# Patient Record
Sex: Female | Born: 1935 | Race: White | Hispanic: No | Smoking: Former smoker
Health system: Southern US, Community
[De-identification: ages and names within clinical notes are randomized; demographics above are authoritative.]

## PROBLEM LIST (undated history)

## (undated) DIAGNOSIS — Z923 Personal history of irradiation: Secondary | ICD-10-CM

## (undated) DIAGNOSIS — C159 Malignant neoplasm of esophagus, unspecified: Secondary | ICD-10-CM

## (undated) DIAGNOSIS — E876 Hypokalemia: Secondary | ICD-10-CM

## (undated) DIAGNOSIS — I10 Essential (primary) hypertension: Secondary | ICD-10-CM

## (undated) DIAGNOSIS — M1990 Unspecified osteoarthritis, unspecified site: Secondary | ICD-10-CM

## (undated) DIAGNOSIS — Z9989 Dependence on other enabling machines and devices: Secondary | ICD-10-CM

## (undated) DIAGNOSIS — Z9221 Personal history of antineoplastic chemotherapy: Secondary | ICD-10-CM

## (undated) DIAGNOSIS — R0600 Dyspnea, unspecified: Secondary | ICD-10-CM

## (undated) DIAGNOSIS — C439 Malignant melanoma of skin, unspecified: Secondary | ICD-10-CM

## (undated) DIAGNOSIS — G4730 Sleep apnea, unspecified: Secondary | ICD-10-CM

## (undated) DIAGNOSIS — J449 Chronic obstructive pulmonary disease, unspecified: Secondary | ICD-10-CM

## (undated) DIAGNOSIS — R202 Paresthesia of skin: Secondary | ICD-10-CM

## (undated) DIAGNOSIS — H5702 Anisocoria: Secondary | ICD-10-CM

## (undated) DIAGNOSIS — C4490 Unspecified malignant neoplasm of skin, unspecified: Secondary | ICD-10-CM

## (undated) DIAGNOSIS — G4733 Obstructive sleep apnea (adult) (pediatric): Secondary | ICD-10-CM

## (undated) DIAGNOSIS — K219 Gastro-esophageal reflux disease without esophagitis: Secondary | ICD-10-CM

## (undated) DIAGNOSIS — Z87442 Personal history of urinary calculi: Secondary | ICD-10-CM

## (undated) DIAGNOSIS — G061 Intraspinal abscess and granuloma: Secondary | ICD-10-CM

## (undated) DIAGNOSIS — N3281 Overactive bladder: Secondary | ICD-10-CM

## (undated) DIAGNOSIS — R011 Cardiac murmur, unspecified: Secondary | ICD-10-CM

## (undated) DIAGNOSIS — R490 Dysphonia: Secondary | ICD-10-CM

## (undated) HISTORY — PX: GASTRIC BYPASS: SHX52

## (undated) HISTORY — PX: EYE SURGERY: SHX253

## (undated) HISTORY — PX: CHOLECYSTECTOMY: SHX55

## (undated) HISTORY — DX: Dependence on other enabling machines and devices: Z99.89

## (undated) HISTORY — PX: HIATAL HERNIA REPAIR: SHX195

## (undated) HISTORY — DX: Anisocoria: H57.02

## (undated) HISTORY — PX: HERNIA REPAIR: SHX51

## (undated) HISTORY — DX: Essential (primary) hypertension: I10

## (undated) HISTORY — DX: Dysphonia: R49.0

## (undated) HISTORY — PX: TONSILLECTOMY: SUR1361

## (undated) HISTORY — PX: TOTAL KNEE ARTHROPLASTY: SHX125

## (undated) HISTORY — PX: APPENDECTOMY: SHX54

## (undated) HISTORY — PX: OTHER SURGICAL HISTORY: SHX169

## (undated) HISTORY — DX: Malignant melanoma of skin, unspecified: C43.9

## (undated) HISTORY — DX: Paresthesia of skin: R20.2

## (undated) HISTORY — PX: CATARACT EXTRACTION, BILATERAL: SHX1313

## (undated) HISTORY — DX: Unspecified osteoarthritis, unspecified site: M19.90

## (undated) HISTORY — PX: MELANOMA EXCISION: SHX5266

## (undated) HISTORY — DX: Unspecified malignant neoplasm of skin, unspecified: C44.90

## (undated) HISTORY — DX: Overactive bladder: N32.81

## (undated) HISTORY — DX: Intraspinal abscess and granuloma: G06.1

## (undated) HISTORY — DX: Obstructive sleep apnea (adult) (pediatric): G47.33

## (undated) HISTORY — DX: Hypokalemia: E87.6

## (undated) HISTORY — DX: Sleep apnea, unspecified: G47.30

## (undated) HISTORY — DX: Malignant neoplasm of esophagus, unspecified: C15.9

## (undated) HISTORY — PX: ABDOMINAL HYSTERECTOMY: SHX81

## (undated) HISTORY — PX: CATARACT EXTRACTION: SUR2

## (undated) HISTORY — PX: JOINT REPLACEMENT: SHX530

---

## 2003-02-03 ENCOUNTER — Encounter: Admission: RE | Admit: 2003-02-03 | Discharge: 2003-05-04 | Payer: Self-pay | Admitting: Surgery

## 2003-02-17 ENCOUNTER — Encounter (INDEPENDENT_AMBULATORY_CARE_PROVIDER_SITE_OTHER): Payer: Self-pay | Admitting: Cardiology

## 2003-02-17 ENCOUNTER — Ambulatory Visit (HOSPITAL_COMMUNITY): Admission: RE | Admit: 2003-02-17 | Discharge: 2003-02-17 | Payer: Self-pay | Admitting: Internal Medicine

## 2003-03-15 ENCOUNTER — Encounter: Admission: RE | Admit: 2003-03-15 | Discharge: 2003-03-15 | Payer: Self-pay | Admitting: Surgery

## 2003-04-26 ENCOUNTER — Inpatient Hospital Stay (HOSPITAL_COMMUNITY): Admission: RE | Admit: 2003-04-26 | Discharge: 2003-04-29 | Payer: Self-pay | Admitting: Surgery

## 2003-05-10 ENCOUNTER — Encounter: Admission: RE | Admit: 2003-05-10 | Discharge: 2003-08-08 | Payer: Self-pay | Admitting: Surgery

## 2004-03-20 ENCOUNTER — Ambulatory Visit: Payer: Self-pay | Admitting: Internal Medicine

## 2004-09-04 ENCOUNTER — Ambulatory Visit: Payer: Self-pay | Admitting: Gastroenterology

## 2004-11-21 ENCOUNTER — Encounter: Admission: RE | Admit: 2004-11-21 | Discharge: 2005-02-19 | Payer: Self-pay | Admitting: Surgery

## 2005-03-05 ENCOUNTER — Encounter: Admission: RE | Admit: 2005-03-05 | Discharge: 2005-03-05 | Payer: Self-pay | Admitting: Surgery

## 2005-07-10 ENCOUNTER — Ambulatory Visit: Payer: Self-pay | Admitting: Internal Medicine

## 2005-07-23 ENCOUNTER — Ambulatory Visit: Payer: Self-pay | Admitting: Internal Medicine

## 2006-07-16 ENCOUNTER — Ambulatory Visit: Payer: Self-pay | Admitting: Internal Medicine

## 2006-12-23 ENCOUNTER — Emergency Department: Payer: Self-pay | Admitting: Emergency Medicine

## 2007-08-19 ENCOUNTER — Ambulatory Visit: Payer: Self-pay | Admitting: Internal Medicine

## 2007-10-14 ENCOUNTER — Ambulatory Visit: Payer: Self-pay | Admitting: Unknown Physician Specialty

## 2007-11-10 ENCOUNTER — Ambulatory Visit: Payer: Self-pay | Admitting: Ophthalmology

## 2007-11-10 ENCOUNTER — Other Ambulatory Visit: Payer: Self-pay

## 2007-11-16 ENCOUNTER — Ambulatory Visit: Payer: Self-pay | Admitting: Ophthalmology

## 2008-08-19 ENCOUNTER — Ambulatory Visit: Payer: Self-pay | Admitting: Internal Medicine

## 2009-08-24 ENCOUNTER — Ambulatory Visit: Payer: Self-pay | Admitting: Internal Medicine

## 2009-09-18 ENCOUNTER — Encounter: Admission: RE | Admit: 2009-09-18 | Discharge: 2009-12-17 | Payer: Self-pay | Admitting: Surgery

## 2010-01-11 ENCOUNTER — Encounter: Admission: RE | Admit: 2010-01-11 | Discharge: 2010-02-09 | Payer: Self-pay | Admitting: Surgery

## 2010-04-25 ENCOUNTER — Encounter
Admission: RE | Admit: 2010-04-25 | Discharge: 2010-06-12 | Payer: Self-pay | Source: Home / Self Care | Attending: Surgery | Admitting: Surgery

## 2010-06-02 ENCOUNTER — Encounter: Payer: Self-pay | Admitting: Surgery

## 2010-07-19 ENCOUNTER — Encounter: Payer: Medicare Other | Attending: Surgery | Admitting: *Deleted

## 2010-07-19 DIAGNOSIS — Z09 Encounter for follow-up examination after completed treatment for conditions other than malignant neoplasm: Secondary | ICD-10-CM | POA: Insufficient documentation

## 2010-07-19 DIAGNOSIS — Z713 Dietary counseling and surveillance: Secondary | ICD-10-CM | POA: Insufficient documentation

## 2010-07-19 DIAGNOSIS — Z9884 Bariatric surgery status: Secondary | ICD-10-CM | POA: Insufficient documentation

## 2010-09-10 ENCOUNTER — Ambulatory Visit: Payer: Self-pay | Admitting: Specialist

## 2010-09-11 ENCOUNTER — Ambulatory Visit: Payer: Self-pay | Admitting: Internal Medicine

## 2010-09-28 NOTE — Discharge Summary (Signed)
NAME:  [name], [name]                       ACCOUNT NO.:  [id number]   MEDICAL RECORD NO.:  [id number]                   PATIENT TYPE:  INP   LOCATION:  0154                                 FACILITY:  [facility]   PHYSICIAN:  Thornton Park. Daphine Deutscher, M.D.             DATE OF BIRTH:  01/23/1935   DATE OF ADMISSION:  04/26/2003  DATE OF DISCHARGE:  04/29/2003                                 DISCHARGE SUMMARY   ADMISSION DIAGNOSIS:  Morbid obesity.   DISCHARGE DIAGNOSIS:  Morbid obesity.   PROCEDURE:  Laparoscopic Roux-En-Y gastric bypass followed by one hour of  enterolysis on April 26, 2003, followed by endoscopy.   HOSPITAL COURSE:  [name] is a 76 year old lady who underwent the  above named procedures.  On postoperative day #1, she had an upper GI which  showed a good emptying.  She was begun on the bariatric protocol, was  advanced until postop day #3 when she was ready for discharge.  Instructions  were given, and she has arranged to be followed up in the office in the next  week for drain removal.   FINAL DIAGNOSIS:  Status post laparoscopic Roux-En-Y gastric bypass for  morbid obesity.                                               Thornton Park Daphine Deutscher, M.D.    MBM/MEDQ  D:  06/14/2003  T:  06/14/2003  Job:  [id number]

## 2010-09-28 NOTE — Op Note (Signed)
NAME:  FEATHER, BERRIE                       ACCOUNT NO.:  [id number]   MEDICAL RECORD NO.:  [id number]                   PATIENT TYPE:  INP   LOCATION:  0001                                 FACILITY:  [facility]   PHYSICIAN:  Sandria Bales. Ezzard Standing, M.D.               DATE OF BIRTH:  May 05, 1935   DATE OF PROCEDURE:  04/26/2003  DATE OF DISCHARGE:                                 OPERATIVE REPORT   PREOPERATIVE DIAGNOSIS:  Morbid obesity.   POSTOPERATIVE DIAGNOSIS:  Morbid obesity with gastrojejunostomy.   PROCEDURE:  Esophagogastroscopy.   SURGEON:  Sandria Bales. Ezzard Standing, M.D.   ANESTHESIA:  General endotracheal.   INDICATIONS FOR PROCEDURE:  Ms. Minton is a 76 year old morbidly obese  white female who has undergone a laparoscopic roux-Y gastrojejunostomy by  Dr. Wenda Low.  While he has done the laparoscopy, I have gone to the head  of the table and done an esophagogastroscopy.   I passed an Olympus endoscope without difficulty down her mouth into her  stomach pouch, identified the gastrojejunal junction and took photos of  this. She had no leaking from the staple line.  At the same time that I was  insufflating the stomach with air, Dr. Daphine Deutscher was flooding the upper abdomen  with saline as we saw no bubbling.  I measured the gastrojejunostomy at  about 43-44 cm, the patient's GE junction was about 38 cm, so her pouch is  about 5-6 cm in length otherwise her stomach looked unremarkable.  Her  esophagus looked unremarkable.  The patient tolerated the procedure well.   Dr. Daphine Deutscher will dictate the remaining portion of the operation which will be  the gastrojejunostomy bypass.                                               Sandria Bales. Ezzard Standing, M.D.    DHN/MEDQ  D:  04/26/2003  T:  04/26/2003  Job:  [id number]   cc:   Thornton Park Daphine Deutscher, M.D.  [address], [address]  [place]  [place] [address]  Fax: [phone]

## 2010-10-25 ENCOUNTER — Encounter: Payer: Medicare Other | Attending: Surgery | Admitting: *Deleted

## 2010-10-25 DIAGNOSIS — Z09 Encounter for follow-up examination after completed treatment for conditions other than malignant neoplasm: Secondary | ICD-10-CM | POA: Insufficient documentation

## 2010-10-25 DIAGNOSIS — Z713 Dietary counseling and surveillance: Secondary | ICD-10-CM | POA: Insufficient documentation

## 2010-10-25 DIAGNOSIS — Z9884 Bariatric surgery status: Secondary | ICD-10-CM | POA: Insufficient documentation

## 2011-01-21 ENCOUNTER — Ambulatory Visit: Payer: Self-pay | Admitting: General Practice

## 2011-01-21 DIAGNOSIS — I10 Essential (primary) hypertension: Secondary | ICD-10-CM

## 2011-01-22 ENCOUNTER — Encounter: Payer: Medicare Other | Attending: Surgery | Admitting: *Deleted

## 2011-01-22 ENCOUNTER — Encounter: Payer: Self-pay | Admitting: *Deleted

## 2011-01-22 DIAGNOSIS — Z09 Encounter for follow-up examination after completed treatment for conditions other than malignant neoplasm: Secondary | ICD-10-CM | POA: Insufficient documentation

## 2011-01-22 DIAGNOSIS — Z9884 Bariatric surgery status: Secondary | ICD-10-CM | POA: Insufficient documentation

## 2011-01-22 DIAGNOSIS — Z713 Dietary counseling and surveillance: Secondary | ICD-10-CM | POA: Insufficient documentation

## 2011-01-22 NOTE — Patient Instructions (Signed)
Goals:  Follow Phase 3B: High Protein + Non-Starchy Vegetables  Eat 3-6 small meals/snacks, every 3-5 hrs  Increase lean protein foods to meet 60-80g goal  Add 15 grams of carbohydrate (fruit, whole grain, starchy vegetable) with meals (or 1/2 cup)  Aim for >30 min of physical activity daily  Follow up with PT after knee replacement for tips on being active after surgery

## 2011-01-22 NOTE — Progress Notes (Signed)
  Follow-up visit: 7 Years Post-Operative Gastric Bypass Surgery  Medical Nutrition Therapy:  Appt start time: 1500 end time:  1500 and 1530.  Assessment:  Primary concerns today: post-operative bariatric surgery nutrition management. [name] reports that she has been dealing with a great deal of stress over the past 3 months due to her husband's health and her knee problems. [name] reports that she will be having a total knee replacement in two weeks. She is fearful of regaining all of her weight during her recovery time. She does not give a food recall at her visit today. She does reports incidences of over eating and poor food choices such as meals high in carbohydrate and sugar. She notes that she is here today for positive reinforcement of her goals and motivation.  Weight today: 254.3 lbs Weight change: minimal Total weight lost: 65.7 lbs total BMI: 45.1% Weight goal: 220 lbs  Fluid intake: water, crystal light tea, protein shake = 64 oz Estimated total protein intake: 60-70 g   Medications: Started new RLS drug: Requip Supplementation: Taking calcium, B12, MVI  Using straws: No Drinking while eating: No Hair loss: No Carbonated beverages: No N/V/D/C: No Dumping syndrome: None reported  Recent physical activity:  Very limited physical activity due to knee pain.  Progress Towards Goal(s):  In progress.   Nutritional Diagnosis:  -3.3 Overweight/obesity As related to s/p gastric bypass surgery.  As evidenced by by pt with BMI >30.    Intervention:  Nutrition education.  Monitoring/Evaluation:  Dietary intake, exercise, portion sizes, and body weight. Follow up in 3 months for post-op visit.

## 2011-02-04 ENCOUNTER — Inpatient Hospital Stay: Payer: Self-pay | Admitting: General Practice

## 2011-02-08 ENCOUNTER — Encounter: Payer: Self-pay | Admitting: Internal Medicine

## 2011-02-11 ENCOUNTER — Encounter: Payer: Self-pay | Admitting: Internal Medicine

## 2011-10-04 ENCOUNTER — Ambulatory Visit: Payer: Self-pay | Admitting: Family Medicine

## 2011-12-24 ENCOUNTER — Ambulatory Visit: Payer: Self-pay | Admitting: Gastroenterology

## 2012-05-14 ENCOUNTER — Ambulatory Visit: Payer: Self-pay | Admitting: General Practice

## 2012-05-14 LAB — APTT: Activated PTT: 27.1 secs (ref 23.6–35.9)

## 2012-05-14 LAB — URINALYSIS, COMPLETE
Bilirubin,UR: NEGATIVE
Nitrite: NEGATIVE
Ph: 6 (ref 4.5–8.0)
Protein: NEGATIVE
RBC,UR: 13 /HPF (ref 0–5)
Specific Gravity: 1.028 (ref 1.003–1.030)
WBC UR: 128 /HPF (ref 0–5)

## 2012-05-14 LAB — PROTIME-INR
INR: 0.9
Prothrombin Time: 12.3 secs (ref 11.5–14.7)

## 2012-05-14 LAB — MRSA PCR SCREENING

## 2012-05-16 LAB — URINE CULTURE

## 2012-05-27 ENCOUNTER — Inpatient Hospital Stay: Payer: Self-pay | Admitting: General Practice

## 2012-05-28 LAB — BASIC METABOLIC PANEL
Anion Gap: 5 — ABNORMAL LOW (ref 7–16)
Chloride: 105 mmol/L (ref 98–107)
Creatinine: 0.72 mg/dL (ref 0.60–1.30)
EGFR (African American): >60
EGFR (Non-African Amer.): >60
Glucose: 114 mg/dL — ABNORMAL HIGH (ref 65–99)
Osmolality: 278 (ref 275–301)
Potassium: 4.2 mmol/L (ref 3.5–5.1)
Sodium: 139 mmol/L (ref 136–145)

## 2012-05-29 ENCOUNTER — Encounter: Payer: Self-pay | Admitting: Internal Medicine

## 2012-05-29 LAB — BASIC METABOLIC PANEL
Anion Gap: 5 — ABNORMAL LOW (ref 7–16)
BUN: 13 mg/dL (ref 7–18)
Co2: 28 mmol/L (ref 21–32)
Creatinine: 0.65 mg/dL (ref 0.60–1.30)
EGFR (African American): >60
Osmolality: 279 (ref 275–301)
Sodium: 140 mmol/L (ref 136–145)

## 2012-05-29 LAB — HEMOGLOBIN: HGB: 11 g/dL — ABNORMAL LOW (ref 12.0–16.0)

## 2012-10-22 ENCOUNTER — Ambulatory Visit: Payer: Self-pay | Admitting: Family Medicine

## 2013-07-13 ENCOUNTER — Inpatient Hospital Stay: Payer: Self-pay | Admitting: Internal Medicine

## 2013-07-13 LAB — BASIC METABOLIC PANEL
ANION GAP: 5 — AB (ref 7–16)
BUN: 15 mg/dL (ref 7–18)
CREATININE: 0.67 mg/dL (ref 0.60–1.30)
Calcium, Total: 8.3 mg/dL — ABNORMAL LOW (ref 8.5–10.1)
Chloride: 104 mmol/L (ref 98–107)
Co2: 29 mmol/L (ref 21–32)
EGFR (African American): >60
GLUCOSE: 115 mg/dL — AB (ref 65–99)
Osmolality: 277 (ref 275–301)
Potassium: 4.1 mmol/L (ref 3.5–5.1)
Sodium: 138 mmol/L (ref 136–145)

## 2013-07-13 LAB — CBC
HCT: 38.8 % (ref 35.0–47.0)
HGB: 12.9 g/dL (ref 12.0–16.0)
MCH: 30.8 pg (ref 26.0–34.0)
MCHC: 33.2 g/dL (ref 32.0–36.0)
MCV: 93 fL (ref 80–100)
Platelet: 212 10*3/uL (ref 150–440)
RBC: 4.19 10*6/uL (ref 3.80–5.20)
RDW: 13.6 % (ref 11.5–14.5)
WBC: 12.8 10*3/uL — ABNORMAL HIGH (ref 3.6–11.0)

## 2013-07-13 LAB — TROPONIN I
Troponin-I: <0.02 ng/mL
Troponin-I: <0.02 ng/mL
Troponin-I: <0.02 ng/mL

## 2013-07-13 LAB — PRO B NATRIURETIC PEPTIDE: B-TYPE NATIURETIC PEPTID: 129 pg/mL (ref 0–450)

## 2013-07-15 ENCOUNTER — Emergency Department (HOSPITAL_COMMUNITY): Payer: PRIVATE HEALTH INSURANCE

## 2013-07-15 ENCOUNTER — Encounter (HOSPITAL_COMMUNITY): Payer: Self-pay | Admitting: Emergency Medicine

## 2013-07-15 ENCOUNTER — Inpatient Hospital Stay (HOSPITAL_COMMUNITY)
Admission: EM | Admit: 2013-07-15 | Discharge: 2013-07-22 | DRG: 029 | Disposition: A | Discharge status: Rehabilitation Facility | Payer: PRIVATE HEALTH INSURANCE | Attending: Neurosurgery | Admitting: Neurosurgery

## 2013-07-15 DIAGNOSIS — M5000 Cervical disc disorder with myelopathy, unspecified cervical region: Secondary | ICD-10-CM | POA: Diagnosis present

## 2013-07-15 DIAGNOSIS — Z87891 Personal history of nicotine dependence: Secondary | ICD-10-CM

## 2013-07-15 DIAGNOSIS — I10 Essential (primary) hypertension: Secondary | ICD-10-CM | POA: Diagnosis present

## 2013-07-15 DIAGNOSIS — Z96659 Presence of unspecified artificial knee joint: Secondary | ICD-10-CM

## 2013-07-15 DIAGNOSIS — Z7982 Long term (current) use of aspirin: Secondary | ICD-10-CM

## 2013-07-15 DIAGNOSIS — M4712 Other spondylosis with myelopathy, cervical region: Secondary | ICD-10-CM | POA: Diagnosis present

## 2013-07-15 DIAGNOSIS — G061 Intraspinal abscess and granuloma: Principal | ICD-10-CM | POA: Diagnosis present

## 2013-07-15 DIAGNOSIS — J9600 Acute respiratory failure, unspecified whether with hypoxia or hypercapnia: Secondary | ICD-10-CM

## 2013-07-15 DIAGNOSIS — G062 Extradural and subdural abscess, unspecified: Secondary | ICD-10-CM

## 2013-07-15 DIAGNOSIS — M4802 Spinal stenosis, cervical region: Secondary | ICD-10-CM

## 2013-07-15 DIAGNOSIS — M129 Arthropathy, unspecified: Secondary | ICD-10-CM | POA: Diagnosis present

## 2013-07-15 DIAGNOSIS — Z85828 Personal history of other malignant neoplasm of skin: Secondary | ICD-10-CM

## 2013-07-15 DIAGNOSIS — Z9884 Bariatric surgery status: Secondary | ICD-10-CM

## 2013-07-15 DIAGNOSIS — J449 Chronic obstructive pulmonary disease, unspecified: Secondary | ICD-10-CM | POA: Diagnosis present

## 2013-07-15 DIAGNOSIS — J4489 Other specified chronic obstructive pulmonary disease: Secondary | ICD-10-CM | POA: Diagnosis present

## 2013-07-15 DIAGNOSIS — Z9989 Dependence on other enabling machines and devices: Secondary | ICD-10-CM

## 2013-07-15 DIAGNOSIS — G4733 Obstructive sleep apnea (adult) (pediatric): Secondary | ICD-10-CM | POA: Diagnosis present

## 2013-07-15 LAB — COMPREHENSIVE METABOLIC PANEL
ALT: 18 U/L (ref 0–35)
AST: 27 U/L (ref 0–37)
Albumin: 3.4 g/dL — ABNORMAL LOW (ref 3.5–5.2)
Alkaline Phosphatase: 79 U/L (ref 39–117)
BUN: 28 mg/dL — ABNORMAL HIGH (ref 6–23)
CO2: 25 mEq/L (ref 19–32)
Calcium: 9.8 mg/dL (ref 8.4–10.5)
Chloride: 94 mEq/L — ABNORMAL LOW (ref 96–112)
Creatinine, Ser: 0.87 mg/dL (ref 0.50–1.10)
GFR calc Af Amer: 73 mL/min — ABNORMAL LOW (ref >90)
GFR calc non Af Amer: 63 mL/min — ABNORMAL LOW (ref >90)
Glucose, Bld: 114 mg/dL — ABNORMAL HIGH (ref 70–99)
Potassium: 4.2 mEq/L (ref 3.7–5.3)
Sodium: 136 mEq/L — ABNORMAL LOW (ref 137–147)
Total Bilirubin: 0.5 mg/dL (ref 0.3–1.2)
Total Protein: 8.1 g/dL (ref 6.0–8.3)

## 2013-07-15 LAB — I-STAT CHEM 8, ED
BUN: 33 mg/dL — ABNORMAL HIGH (ref 6–23)
CALCIUM ION: 1.11 mmol/L — AB (ref 1.13–1.30)
Chloride: 98 mEq/L (ref 96–112)
Creatinine, Ser: 1.1 mg/dL (ref 0.50–1.10)
GLUCOSE: 121 mg/dL — AB (ref 70–99)
HEMATOCRIT: 43 % (ref 36.0–46.0)
HEMOGLOBIN: 14.6 g/dL (ref 12.0–15.0)
Potassium: 4.1 mEq/L (ref 3.7–5.3)
Sodium: 136 mEq/L — ABNORMAL LOW (ref 137–147)
TCO2: 30 mmol/L (ref 0–100)

## 2013-07-15 LAB — URINALYSIS, ROUTINE W REFLEX MICROSCOPIC
Glucose, UA: NEGATIVE mg/dL
Ketones, ur: NEGATIVE mg/dL
Leukocytes, UA: NEGATIVE
Nitrite: NEGATIVE
Protein, ur: 100 mg/dL — AB
Specific Gravity, Urine: 1.015 (ref 1.005–1.030)
Urobilinogen, UA: 1 mg/dL (ref 0.0–1.0)
pH: 6 (ref 5.0–8.0)

## 2013-07-15 LAB — CBC WITH DIFFERENTIAL/PLATELET
Basophils Absolute: 0 10*3/uL (ref 0.0–0.1)
Basophils Relative: 0 % (ref 0–1)
Eosinophils Absolute: 0 10*3/uL (ref 0.0–0.7)
Eosinophils Relative: 0 % (ref 0–5)
HCT: 37.7 % (ref 36.0–46.0)
Hemoglobin: 12.6 g/dL (ref 12.0–15.0)
Lymphocytes Relative: 5 % — ABNORMAL LOW (ref 12–46)
Lymphs Abs: 0.7 10*3/uL (ref 0.7–4.0)
MCH: 31.9 pg (ref 26.0–34.0)
MCHC: 33.4 g/dL (ref 30.0–36.0)
MCV: 95.4 fL (ref 78.0–100.0)
Monocytes Absolute: 0.9 10*3/uL (ref 0.1–1.0)
Monocytes Relative: 6 % (ref 3–12)
Neutro Abs: 13 10*3/uL — ABNORMAL HIGH (ref 1.7–7.7)
Neutrophils Relative %: 89 % — ABNORMAL HIGH (ref 43–77)
Platelets: UNDETERMINED 10*3/uL (ref 150–400)
RBC: 3.95 MIL/uL (ref 3.87–5.11)
RDW: 14.1 % (ref 11.5–15.5)
WBC: 14.6 10*3/uL — ABNORMAL HIGH (ref 4.0–10.5)

## 2013-07-15 LAB — URINE MICROSCOPIC-ADD ON

## 2013-07-15 LAB — PROTIME-INR
INR: 0.91 (ref 0.00–1.49)
Prothrombin Time: 12.1 seconds (ref 11.6–15.2)

## 2013-07-15 LAB — SAMPLE TO BLOOD BANK

## 2013-07-15 LAB — LIPASE, BLOOD: Lipase: 19 U/L (ref 11–59)

## 2013-07-15 LAB — APTT

## 2013-07-15 LAB — TROPONIN I

## 2013-07-15 MED ORDER — SODIUM CHLORIDE 0.9 % IV BOLUS (SEPSIS)
1000.0000 mL | Freq: Once | INTRAVENOUS | Status: AC
  Administered 2013-07-15: 1000 mL via INTRAVENOUS

## 2013-07-15 MED ORDER — ONDANSETRON HCL 4 MG/2ML IJ SOLN
4.0000 mg | Freq: Once | INTRAMUSCULAR | Status: AC
  Administered 2013-07-15: 4 mg via INTRAVENOUS
  Filled 2013-07-15: qty 2

## 2013-07-15 MED ORDER — IOHEXOL 350 MG/ML SOLN
100.0000 mL | Freq: Once | INTRAVENOUS | Status: AC | PRN
  Administered 2013-07-15: 100 mL via INTRAVENOUS

## 2013-07-15 MED ORDER — MORPHINE SULFATE 4 MG/ML IJ SOLN
4.0000 mg | Freq: Once | INTRAMUSCULAR | Status: AC
  Administered 2013-07-15: 4 mg via INTRAVENOUS
  Filled 2013-07-15: qty 1

## 2013-07-15 MED ORDER — DIPHENHYDRAMINE HCL 50 MG/ML IJ SOLN
50.0000 mg | Freq: Once | INTRAMUSCULAR | Status: AC
  Administered 2013-07-15: 50 mg via INTRAVENOUS
  Filled 2013-07-15: qty 1

## 2013-07-15 NOTE — ED Notes (Signed)
Patient remains in MRI 

## 2013-07-15 NOTE — ED Notes (Signed)
Ginger Organ, [phone]

## 2013-07-15 NOTE — ED Notes (Signed)
Updated pt on delay. Pt is alert and orineted, in no distress at this time. Requesting an MRI

## 2013-07-15 NOTE — ED Notes (Signed)
Patient still unable to void at this time.  Urine sample requested 3x.

## 2013-07-15 NOTE — ED Notes (Addendum)
At [place], this RN accompanying

## 2013-07-15 NOTE — ED Notes (Signed)
Patient transported to CT 

## 2013-07-15 NOTE — ED Notes (Signed)
Urine sample request.  Patient unable to void at this time.

## 2013-07-15 NOTE — ED Notes (Addendum)
Pt felt a pain between shoulder blades Monday night, was in pain all night, describes pain as pins and needles, weakness in extremities.  Went to [facility] on Tuesday, had blood work, chest xray, and CT scan, EKG, was told everything was negative.  Scheduled an appointment Wednesday with PCP, scheduled MRI for the 13th of March.  Seeks care today in hopes of getting an MRI sooner and a neurology consult.  Was given a shot of prednisone at her PCP and is now on tapered dose.  Patient states she is "paralyzed practically,." Bilateral weakness noted, in upper extremities, otherwise full movement intact but states that her feet and hands "feel weird".  No facial drooping noted, patient states that she cant see as well since Monday.

## 2013-07-15 NOTE — ED Provider Notes (Addendum)
TIME SEEN: 4:47 PM  CHIEF COMPLAINT: Pain between the shoulder blades  HPI: Patient is a 79 year old female with history of hypertension, COPD, obstructive sleep apnea, morbid obesity s/p gastric bypass who presents emergency Department with pain in between her shoulder blades that she is unable to describe that started on Monday, 3 days ago. She denies any aggravating or relieving factors. She reports that when she moves she feels tingling and electric shooting pains in her arms and legs. She feels that her arms and legs are very weak she is unable to walk normally. She denies any shortness of breath but did have nausea and vomiting in the emergency department today which she thinks is due to taking Vicodin. No diaphoresis. No history of injury to the back or increased exertion. No chest pain. No bowel or bladder incontinence or urinary retention. She was seen at [facility] emergency department 2 days ago states she had a full workup including a CT scan.  ROS: See HPI Constitutional: no fever  Eyes: no drainage  ENT: no runny nose   Cardiovascular:  no chest pain  Resp: no SOB  GI:  vomiting GU: no dysuria Integumentary: no rash  Allergy: no hives  Musculoskeletal: no leg swelling  Neurological: no slurred speech ROS otherwise negative  PAST MEDICAL HISTORY/PAST SURGICAL HISTORY:  Past Medical History  Diagnosis Date  . Hypertension   . Asthma   . Cancer     Skin Cancer  . Sleep apnea     MEDICATIONS:  Prior to Admission medications   Medication Sig Start Date End Date Taking? Authorizing Provider  acetaminophen (TYLENOL) 650 MG CR tablet Take 650 mg by mouth every 8 (eight) hours as needed.      Historical Provider, MD  aspirin 81 MG tablet Take 81 mg by mouth daily.      Historical Provider, MD  calcium carbonate (OS-CAL - DOSED IN MG OF ELEMENTAL CALCIUM) 1250 MG tablet Take 1 tablet by mouth daily.      Historical Provider, MD  celecoxib (CELEBREX) 100 MG capsule Take  100 mg by mouth 2 (two) times daily.      Historical Provider, MD  clotrimazole-betamethasone (LOTRISONE) cream  06/03/13   Historical Provider, MD  cyanocobalamin 500 MCG tablet Take 500 mcg by mouth daily.      Historical Provider, MD  ferrous fumarate (HEMOCYTE - 106 MG FE) 325 (106 FE) MG TABS Take 1 tablet by mouth.      Historical Provider, MD  gabapentin (NEURONTIN) 300 MG capsule  07/14/13   Historical Provider, MD  HYDROcodone-acetaminophen (NORCO/VICODIN) 5-325 MG per tablet  07/14/13   Historical Provider, MD  lisinopril (PRINIVIL,ZESTRIL) 5 MG tablet  07/13/13   Historical Provider, MD  metoprolol succinate (TOPROL-XL) 50 MG 24 hr tablet  06/15/13   Historical Provider, MD  Multiple Vitamins-Minerals (MULTIVITAMIN WITH MINERALS) tablet Take 1 tablet by mouth daily.      Historical Provider, MD  oxybutynin (DITROPAN) 5 MG tablet  06/15/13   Historical Provider, MD  predniSONE (DELTASONE) 10 MG tablet  07/14/13   Historical Provider, MD  PREVNAR 13 SUSP injection  05/27/13   Historical Provider, MD  rOPINIRole (REQUIP) 1 MG tablet Take 1 mg by mouth 3 (three) times daily.      Historical Provider, MD  traZODone (DESYREL) 50 MG tablet  06/30/13   Historical Provider, MD    ALLERGIES:  Allergies  Allergen Reactions  . Codeine   . Synvisc [Hylan G-F 20]  SOCIAL HISTORY:  History  Substance Use Topics  . Smoking status: Never Smoker   . Smokeless tobacco: Not on file  . Alcohol Use: No    FAMILY HISTORY: History reviewed. No pertinent family history.  EXAM: BP 122/73  Pulse 74  Temp(Src) 98.1 F (36.7 C)  Resp 18  SpO2 95% CONSTITUTIONAL: Alert and oriented and responds appropriately to questions. Well-appearing; well-nourished, in no apparent distress, pleasant, morbidly obese HEAD: Normocephalic EYES: Conjunctivae clear, PERRL ENT: normal nose; no rhinorrhea; moist mucous membranes; pharynx without lesions noted NECK: Supple, no meningismus, no LAD  CARD: RRR; S1 and S2  appreciated; possible diastolic murmurs, no clicks, no rubs, no gallops RESP: Normal chest excursion without splinting or tachypnea; breath sounds clear and equal bilaterally; no wheezes, no rhonchi, no rales,  ABD/GI: Normal bowel sounds; non-distended; soft, non-tender, no rebound, no guarding BACK:  The back appears normal and is tender to palpation in between her shoulder blades but no midline step-off or deformity, there is no CVA tenderness EXT: Normal ROM in all joints; non-tender to palpation; no edema; normal capillary refill; no cyanosis    SKIN: Normal color for age and race; warm NEURO: Moves all extremities equally; strength 4/5 in all 4 extremities, patient reports mild sensory deficit in her bilateral arms and legs but normal sensation in her face, cranial nerves II through XII intact, no clonus, normal deep tendon reflexes exam is limited given patient's morbid obesity PSYCH: The patient's mood and manner are appropriate. Grooming and personal hygiene are appropriate.  MEDICAL DECISION MAKING: Patient here with concerning symptoms for possible dissection with spinal cord involvement versus spinal cord lesion versus ACS versus pancreatitis/biliary colic versus muscle spasm. Given patient reports she's had a CT scan which she states to "rule out dissection" was completed several days ago, will obtain these records from [facility]. We'll repeat labs with troponin, LFTs, lipase. If CT rules out dissection from 2 days ago, will obtain MRI of her cervical and thoracic spine given her new neurologic deficits. Patient reports his pain has been constant for 3 days it is progressively worsening. She however appears very well-appearing on exam, no apparent distress, smiling.  ED PROGRESS: Patient's chest x-ray shows prominence of her ascending and descending aorta. Radiology recommends CT scan. We are unable to obtain records from outside hospital at this time. Given she has back pain with new  murmur and neurologic deficits, will repeat CT scan to rule out dissection.  8:41 PM  Pt CT scan shows no dissection. Her troponin is negative. We have received outside records from [facility] and she had a negative troponin on 07/13/13. I do not feel this is cardiac in nature given she has not had 2 negative troponins greater than 48 hours apart her symptoms have been constant. We'll obtain MRI of her thoracic and cervical spine. She did have some itching of her extremities after receiving IV contrast. We have given IV Benadryl. No hives, hypotension, difficulty breathing, wheezing.   12:35 AM Patient's MRI shows severe cervical canal stenosis but is limited due to motion artifact. On repeat exam, patient has even worsening strength in her bilateral upper and lower extremities and is unable to stand on her own. She still has normal reflexes in bilateral upper lower extremity is in no clonus. She has no rectal tone currently. We'll also obtain bladder scan as she has been complaining of urinary retention which her son reports to me currently. Discussed with Dr. Vertell Limber with neurosurgery  who has reviewed her imaging and he will see her in the emergency department.   CRITICAL CARE Performed by: Nyra Jabs   Total critical care time: 45 minutes  Critical care time was exclusive of separately billable procedures and treating other patients.  Critical care was necessary to treat or prevent imminent or life-threatening deterioration.  Critical care was time spent personally by me on the following activities: development of treatment plan with patient and/or surrogate as well as nursing, discussions with consultants, evaluation of patient's response to treatment, examination of patient, obtaining history from patient or surrogate, ordering and performing treatments and interventions, ordering and review of laboratory studies, ordering and review of radiographic studies, pulse oximetry and  re-evaluation of patient's condition.      EKG Interpretation  Date/Time:  Thursday July 15 2013 12:48:28 EST Ventricular Rate:  74 PR Interval:  160 QRS Duration: 82 QT Interval:  382 QTC Calculation: 424 R Axis:   29 Text Interpretation:  Normal sinus rhythm No significant change was found Confirmed by Lanayah Gartley,  DO, Datrell Dunton [phone]) on 07/15/2013 4:47:42 PM          Delice Bison Anyi Fels, DO 07/15/13 [place] Halle Davlin, DO 07/16/13 Jackson, DO 07/16/13 [id number]

## 2013-07-15 NOTE — ED Notes (Addendum)
Per pt sts Monday she began having pain in between her shoulder blades and since it has been radiating into her neck and down her back. sts she feels electric surges going down her legs. sts weakness in her legs and can walk with assistance when normally she has no issue.

## 2013-07-16 ENCOUNTER — Emergency Department (HOSPITAL_COMMUNITY): Payer: PRIVATE HEALTH INSURANCE

## 2013-07-16 ENCOUNTER — Encounter (HOSPITAL_COMMUNITY): Payer: PRIVATE HEALTH INSURANCE | Admitting: Certified Registered Nurse Anesthetist

## 2013-07-16 ENCOUNTER — Encounter (HOSPITAL_COMMUNITY)
Admission: EM | Disposition: A | Discharge status: Rehabilitation Facility | Payer: Self-pay | Source: Home / Self Care | Attending: Neurosurgery

## 2013-07-16 ENCOUNTER — Emergency Department (HOSPITAL_COMMUNITY): Payer: PRIVATE HEALTH INSURANCE | Admitting: Certified Registered Nurse Anesthetist

## 2013-07-16 DIAGNOSIS — G4733 Obstructive sleep apnea (adult) (pediatric): Secondary | ICD-10-CM

## 2013-07-16 DIAGNOSIS — J9600 Acute respiratory failure, unspecified whether with hypoxia or hypercapnia: Secondary | ICD-10-CM

## 2013-07-16 DIAGNOSIS — Z9989 Dependence on other enabling machines and devices: Secondary | ICD-10-CM

## 2013-07-16 DIAGNOSIS — G061 Intraspinal abscess and granuloma: Principal | ICD-10-CM

## 2013-07-16 HISTORY — PX: ANTERIOR CERVICAL DECOMP/DISCECTOMY FUSION: SHX1161

## 2013-07-16 LAB — GLUCOSE, CAPILLARY: GLUCOSE-CAPILLARY: 127 mg/dL — AB (ref 70–99)

## 2013-07-16 LAB — MRSA PCR SCREENING: MRSA BY PCR: NEGATIVE

## 2013-07-16 LAB — TRIGLYCERIDES: TRIGLYCERIDES: 134 mg/dL (ref <150)

## 2013-07-16 SURGERY — ANTERIOR CERVICAL DECOMPRESSION/DISCECTOMY FUSION 1 LEVEL
Anesthesia: General | Site: Neck

## 2013-07-16 MED ORDER — MORPHINE SULFATE 2 MG/ML IJ SOLN
1.0000 mg | INTRAMUSCULAR | Status: DC | PRN
  Administered 2013-07-16 (×3): 2 mg via INTRAVENOUS
  Administered 2013-07-16: 4 mg via INTRAVENOUS
  Administered 2013-07-16 (×2): 2 mg via INTRAVENOUS
  Filled 2013-07-16: qty 1
  Filled 2013-07-16: qty 2
  Filled 2013-07-16 (×4): qty 1

## 2013-07-16 MED ORDER — FERROUS FUMARATE 325 (106 FE) MG PO TABS
1.0000 | ORAL_TABLET | ORAL | Status: DC
  Administered 2013-07-19 – 2013-07-21 (×2): 106 mg via ORAL
  Filled 2013-07-16 (×4): qty 1

## 2013-07-16 MED ORDER — SODIUM CHLORIDE 0.9 % IJ SOLN: 3.0000 mL | INTRAMUSCULAR | Status: DC | PRN

## 2013-07-16 MED ORDER — ONDANSETRON HCL 4 MG/2ML IJ SOLN
INTRAMUSCULAR | Status: DC | PRN
  Administered 2013-07-16: 4 mg via INTRAVENOUS

## 2013-07-16 MED ORDER — ADULT MULTIVITAMIN W/MINERALS CH
1.0000 | ORAL_TABLET | Freq: Every day | ORAL | Status: DC
  Administered 2013-07-17 – 2013-07-22 (×6): 1 via ORAL
  Filled 2013-07-16 (×7): qty 1

## 2013-07-16 MED ORDER — MOMETASONE FURO-FORMOTEROL FUM 100-5 MCG/ACT IN AERO
2.0000 | INHALATION_SPRAY | Freq: Two times a day (BID) | RESPIRATORY_TRACT | Status: DC
  Administered 2013-07-17 – 2013-07-21 (×8): 2 via RESPIRATORY_TRACT
  Filled 2013-07-16: qty 8.8

## 2013-07-16 MED ORDER — FENTANYL CITRATE 0.05 MG/ML IJ SOLN
INTRAMUSCULAR | Status: AC
  Filled 2013-07-16: qty 5

## 2013-07-16 MED ORDER — CALCIUM CARBONATE 1250 (500 CA) MG PO TABS
2.0000 | ORAL_TABLET | Freq: Every day | ORAL | Status: DC
  Administered 2013-07-17 – 2013-07-22 (×6): 1000 mg via ORAL
  Filled 2013-07-16 (×9): qty 2

## 2013-07-16 MED ORDER — CYANOCOBALAMIN 500 MCG PO TABS
500.0000 ug | ORAL_TABLET | Freq: Every day | ORAL | Status: DC
  Administered 2013-07-17 – 2013-07-22 (×6): 500 ug via ORAL
  Filled 2013-07-16 (×7): qty 1

## 2013-07-16 MED ORDER — LIDOCAINE HCL (CARDIAC) 20 MG/ML IV SOLN: INTRAVENOUS | Status: DC | PRN

## 2013-07-16 MED ORDER — CEFAZOLIN SODIUM 1-5 GM-% IV SOLN
1.0000 g | Freq: Three times a day (TID) | INTRAVENOUS | Status: DC
  Administered 2013-07-16: 1 g via INTRAVENOUS
  Filled 2013-07-16 (×2): qty 50

## 2013-07-16 MED ORDER — HYDROCODONE-ACETAMINOPHEN 5-325 MG PO TABS: 1.0000 | ORAL_TABLET | ORAL | Status: DC | PRN

## 2013-07-16 MED ORDER — METHOCARBAMOL 100 MG/ML IJ SOLN: 500.0000 mg | Freq: Four times a day (QID) | INTRAVENOUS | Status: DC | PRN

## 2013-07-16 MED ORDER — TRAZODONE HCL 50 MG PO TABS
50.0000 mg | ORAL_TABLET | Freq: Every day | ORAL | Status: DC
  Administered 2013-07-17 – 2013-07-20 (×4): 50 mg via ORAL
  Filled 2013-07-16 (×6): qty 1

## 2013-07-16 MED ORDER — MORPHINE SULFATE 2 MG/ML IJ SOLN
2.0000 mg | Freq: Once | INTRAMUSCULAR | Status: AC
  Administered 2013-07-16: 2 mg via INTRAVENOUS
  Filled 2013-07-16: qty 1

## 2013-07-16 MED ORDER — ARTIFICIAL TEARS OP OINT
TOPICAL_OINTMENT | OPHTHALMIC | Status: DC | PRN
  Administered 2013-07-16: 1 via OPHTHALMIC

## 2013-07-16 MED ORDER — PROPOFOL 10 MG/ML IV BOLUS
INTRAVENOUS | Status: DC | PRN
  Administered 2013-07-16: 170 mg via INTRAVENOUS

## 2013-07-16 MED ORDER — BISACODYL 10 MG RE SUPP
10.0000 mg | Freq: Every day | RECTAL | Status: DC | PRN
  Administered 2013-07-21: 10 mg via RECTAL
  Filled 2013-07-16: qty 1

## 2013-07-16 MED ORDER — OXYCODONE-ACETAMINOPHEN 5-325 MG PO TABS
1.0000 | ORAL_TABLET | ORAL | Status: DC | PRN
  Administered 2013-07-17 (×2): 1 via ORAL
  Administered 2013-07-19 (×4): 2 via ORAL
  Administered 2013-07-20: 1 via ORAL
  Administered 2013-07-20: 2 via ORAL
  Administered 2013-07-21: 1 via ORAL
  Administered 2013-07-22: 2 via ORAL
  Filled 2013-07-16: qty 2
  Filled 2013-07-16 (×2): qty 1
  Filled 2013-07-16 (×4): qty 2
  Filled 2013-07-16 (×2): qty 1
  Filled 2013-07-16: qty 2

## 2013-07-16 MED ORDER — SODIUM CHLORIDE 0.9 % IV SOLN
1500.0000 mg | INTRAVENOUS | Status: DC
  Administered 2013-07-17 – 2013-07-18 (×2): 1500 mg via INTRAVENOUS
  Filled 2013-07-16 (×2): qty 1500

## 2013-07-16 MED ORDER — ONDANSETRON HCL 4 MG/2ML IJ SOLN
INTRAMUSCULAR | Status: AC
  Filled 2013-07-16: qty 2

## 2013-07-16 MED ORDER — PROPOFOL 10 MG/ML IV EMUL
0.0000 ug/kg/min | INTRAVENOUS | Status: DC
  Administered 2013-07-16: 15 ug/kg/min via INTRAVENOUS

## 2013-07-16 MED ORDER — MENTHOL 3 MG MT LOZG: 1.0000 | LOZENGE | OROMUCOSAL | Status: DC | PRN

## 2013-07-16 MED ORDER — THROMBIN 20000 UNITS EX KIT
PACK | CUTANEOUS | Status: DC | PRN
  Administered 2013-07-16: 03:00:00 via TOPICAL

## 2013-07-16 MED ORDER — LACTATED RINGERS IV SOLN
INTRAVENOUS | Status: DC | PRN
  Administered 2013-07-16 (×3): via INTRAVENOUS

## 2013-07-16 MED ORDER — ONDANSETRON HCL 4 MG/2ML IJ SOLN
4.0000 mg | Freq: Once | INTRAMUSCULAR | Status: AC
  Administered 2013-07-16: 4 mg via INTRAVENOUS
  Filled 2013-07-16: qty 2

## 2013-07-16 MED ORDER — ACETAMINOPHEN 325 MG PO TABS: 650.0000 mg | ORAL_TABLET | ORAL | Status: DC | PRN

## 2013-07-16 MED ORDER — PHENOL 1.4 % MT LIQD: 1.0000 | OROMUCOSAL | Status: DC | PRN

## 2013-07-16 MED ORDER — ROCURONIUM BROMIDE 50 MG/5ML IV SOLN
INTRAVENOUS | Status: AC
  Filled 2013-07-16: qty 1

## 2013-07-16 MED ORDER — PHENYLEPHRINE HCL 10 MG/ML IJ SOLN
10.0000 mg | INTRAVENOUS | Status: DC | PRN
  Administered 2013-07-16: 50 ug/min via INTRAVENOUS

## 2013-07-16 MED ORDER — ROCURONIUM BROMIDE 100 MG/10ML IV SOLN
INTRAVENOUS | Status: DC | PRN
  Administered 2013-07-16: 20 mg via INTRAVENOUS
  Administered 2013-07-16: 50 mg via INTRAVENOUS
  Administered 2013-07-16 (×3): 20 mg via INTRAVENOUS

## 2013-07-16 MED ORDER — ASPIRIN 81 MG PO CHEW
81.0000 mg | CHEWABLE_TABLET | Freq: Every day | ORAL | Status: DC
  Administered 2013-07-17 – 2013-07-22 (×6): 81 mg via ORAL
  Filled 2013-07-16 (×6): qty 1

## 2013-07-16 MED ORDER — FENTANYL CITRATE 0.05 MG/ML IJ SOLN
INTRAMUSCULAR | Status: DC | PRN
  Administered 2013-07-16 (×6): 50 ug via INTRAVENOUS
  Administered 2013-07-16: 100 ug via INTRAVENOUS
  Administered 2013-07-16 (×2): 50 ug via INTRAVENOUS

## 2013-07-16 MED ORDER — BUPIVACAINE HCL (PF) 0.5 % IJ SOLN
INTRAMUSCULAR | Status: DC | PRN
  Administered 2013-07-16: 3.5 mL

## 2013-07-16 MED ORDER — ASPIRIN EC 81 MG PO TBEC
81.0000 mg | DELAYED_RELEASE_TABLET | Freq: Every day | ORAL | Status: DC
  Filled 2013-07-16: qty 1

## 2013-07-16 MED ORDER — DEXTROSE 5 % IV SOLN
2.0000 g | Freq: Two times a day (BID) | INTRAVENOUS | Status: DC
  Administered 2013-07-16 – 2013-07-22 (×13): 2 g via INTRAVENOUS
  Filled 2013-07-16 (×15): qty 2

## 2013-07-16 MED ORDER — METHOCARBAMOL 500 MG PO TABS
500.0000 mg | ORAL_TABLET | Freq: Four times a day (QID) | ORAL | Status: DC | PRN
  Administered 2013-07-18 – 2013-07-21 (×6): 500 mg via ORAL
  Filled 2013-07-16 (×7): qty 1

## 2013-07-16 MED ORDER — PHENYLEPHRINE HCL 10 MG/ML IJ SOLN
INTRAMUSCULAR | Status: DC | PRN
  Administered 2013-07-16: 40 ug via INTRAVENOUS
  Administered 2013-07-16: 80 ug via INTRAVENOUS

## 2013-07-16 MED ORDER — VANCOMYCIN HCL 1000 MG IV SOLR
1500.0000 mg | INTRAVENOUS | Status: DC | PRN
  Administered 2013-07-16: 1500 mg via INTRAVENOUS

## 2013-07-16 MED ORDER — LISINOPRIL 5 MG PO TABS
5.0000 mg | ORAL_TABLET | Freq: Every day | ORAL | Status: DC
  Administered 2013-07-17 – 2013-07-22 (×6): 5 mg via ORAL
  Filled 2013-07-16 (×7): qty 1

## 2013-07-16 MED ORDER — SODIUM CHLORIDE 0.9 % IR SOLN
Status: DC | PRN
  Administered 2013-07-16: 03:00:00

## 2013-07-16 MED ORDER — MOMETASONE FURO-FORMOTEROL FUM 100-5 MCG/ACT IN AERO
2.0000 | INHALATION_SPRAY | Freq: Two times a day (BID) | RESPIRATORY_TRACT | Status: DC
  Filled 2013-07-16: qty 8.8

## 2013-07-16 MED ORDER — ONDANSETRON HCL 4 MG/2ML IJ SOLN: 4.0000 mg | INTRAMUSCULAR | Status: DC | PRN

## 2013-07-16 MED ORDER — 0.9 % SODIUM CHLORIDE (POUR BTL) OPTIME
TOPICAL | Status: DC | PRN
  Administered 2013-07-16: 1000 mL

## 2013-07-16 MED ORDER — LIDOCAINE HCL (CARDIAC) 20 MG/ML IV SOLN
INTRAVENOUS | Status: DC | PRN
  Administered 2013-07-16: 80 mg via INTRAVENOUS

## 2013-07-16 MED ORDER — DOCUSATE SODIUM 100 MG PO CAPS
100.0000 mg | ORAL_CAPSULE | Freq: Two times a day (BID) | ORAL | Status: DC
  Administered 2013-07-17 – 2013-07-22 (×10): 100 mg via ORAL
  Filled 2013-07-16 (×12): qty 1

## 2013-07-16 MED ORDER — DEXAMETHASONE 4 MG PO TABS
4.0000 mg | ORAL_TABLET | Freq: Four times a day (QID) | ORAL | Status: DC
  Administered 2013-07-17 – 2013-07-18 (×2): 4 mg via ORAL
  Filled 2013-07-16 (×8): qty 1

## 2013-07-16 MED ORDER — SENNA 8.6 MG PO TABS
1.0000 | ORAL_TABLET | Freq: Two times a day (BID) | ORAL | Status: DC
  Filled 2013-07-16 (×2): qty 1

## 2013-07-16 MED ORDER — POTASSIUM CHLORIDE IN NACL 20-0.9 MEQ/L-% IV SOLN
INTRAVENOUS | Status: DC
  Administered 2013-07-16: 06:00:00 via INTRAVENOUS
  Filled 2013-07-16 (×2): qty 1000

## 2013-07-16 MED ORDER — ACETAMINOPHEN ER 650 MG PO TBCR: 1300.0000 mg | EXTENDED_RELEASE_TABLET | Freq: Three times a day (TID) | ORAL | Status: DC | PRN

## 2013-07-16 MED ORDER — FUROSEMIDE 20 MG PO TABS
20.0000 mg | ORAL_TABLET | Freq: Every day | ORAL | Status: DC
  Administered 2013-07-17 – 2013-07-22 (×4): 20 mg via ORAL
  Filled 2013-07-16 (×7): qty 1

## 2013-07-16 MED ORDER — DEXAMETHASONE SODIUM PHOSPHATE 4 MG/ML IJ SOLN
4.0000 mg | Freq: Four times a day (QID) | INTRAMUSCULAR | Status: DC
  Administered 2013-07-16 – 2013-07-18 (×8): 4 mg via INTRAVENOUS
  Filled 2013-07-16 (×13): qty 1

## 2013-07-16 MED ORDER — PANTOPRAZOLE SODIUM 40 MG IV SOLR: 40.0000 mg | Freq: Every day | INTRAVENOUS | Status: DC

## 2013-07-16 MED ORDER — PROPOFOL 10 MG/ML IV EMUL
0.0000 ug/kg/min | INTRAVENOUS | Status: DC
  Administered 2013-07-16: 21 ug/kg/min via INTRAVENOUS

## 2013-07-16 MED ORDER — MIDAZOLAM HCL 5 MG/5ML IJ SOLN
INTRAMUSCULAR | Status: DC | PRN
  Administered 2013-07-16: 2 mg via INTRAVENOUS

## 2013-07-16 MED ORDER — ACETAMINOPHEN 650 MG RE SUPP: 650.0000 mg | RECTAL | Status: DC | PRN

## 2013-07-16 MED ORDER — POLYETHYLENE GLYCOL 3350 17 G PO PACK
17.0000 g | PACK | Freq: Every day | ORAL | Status: DC | PRN
  Filled 2013-07-16 (×2): qty 1

## 2013-07-16 MED ORDER — CEFAZOLIN SODIUM-DEXTROSE 2-3 GM-% IV SOLR
INTRAVENOUS | Status: AC
  Filled 2013-07-16: qty 50

## 2013-07-16 MED ORDER — SUCCINYLCHOLINE CHLORIDE 20 MG/ML IJ SOLN
INTRAMUSCULAR | Status: DC | PRN
  Administered 2013-07-16: 140 mg via INTRAVENOUS

## 2013-07-16 MED ORDER — ALUM & MAG HYDROXIDE-SIMETH 200-200-20 MG/5ML PO SUSP: 30.0000 mL | Freq: Four times a day (QID) | ORAL | Status: DC | PRN

## 2013-07-16 MED ORDER — VITAMIN D3 25 MCG (1000 UNIT) PO TABS
2000.0000 [IU] | ORAL_TABLET | Freq: Every day | ORAL | Status: DC
  Administered 2013-07-17 – 2013-07-22 (×6): 2000 [IU] via ORAL
  Filled 2013-07-16 (×7): qty 2

## 2013-07-16 MED ORDER — HYDROCODONE-ACETAMINOPHEN 5-325 MG PO TABS
1.0000 | ORAL_TABLET | ORAL | Status: DC | PRN
  Administered 2013-07-18: 1 via ORAL
  Administered 2013-07-18 – 2013-07-22 (×10): 2 via ORAL
  Administered 2013-07-22: 1 via ORAL
  Filled 2013-07-16 (×4): qty 2
  Filled 2013-07-16: qty 1
  Filled 2013-07-16 (×3): qty 2
  Filled 2013-07-16: qty 1
  Filled 2013-07-16 (×2): qty 2

## 2013-07-16 MED ORDER — DOCUSATE SODIUM 100 MG PO CAPS
100.0000 mg | ORAL_CAPSULE | Freq: Two times a day (BID) | ORAL | Status: DC
  Filled 2013-07-16 (×2): qty 1

## 2013-07-16 MED ORDER — SODIUM CHLORIDE 0.9 % IV SOLN
INTRAVENOUS | Status: AC
  Administered 2013-07-16: 02:00:00 via INTRAVENOUS

## 2013-07-16 MED ORDER — VANCOMYCIN HCL 10 G IV SOLR
1500.0000 mg | Freq: Once | INTRAVENOUS | Status: DC
  Filled 2013-07-16: qty 1500

## 2013-07-16 MED ORDER — LIDOCAINE-EPINEPHRINE 1 %-1:100000 IJ SOLN
INTRAMUSCULAR | Status: DC | PRN
  Administered 2013-07-16: 3.5 mL via INTRADERMAL

## 2013-07-16 MED ORDER — SODIUM CHLORIDE 0.9 % IJ SOLN
3.0000 mL | Freq: Two times a day (BID) | INTRAMUSCULAR | Status: DC
  Administered 2013-07-16: 3 mL via INTRAVENOUS

## 2013-07-16 MED ORDER — MIDAZOLAM HCL 2 MG/2ML IJ SOLN
INTRAMUSCULAR | Status: AC
  Filled 2013-07-16: qty 2

## 2013-07-16 MED ORDER — OXYBUTYNIN CHLORIDE 5 MG PO TABS
2.5000 mg | ORAL_TABLET | Freq: Two times a day (BID) | ORAL | Status: DC
  Administered 2013-07-17 – 2013-07-22 (×11): 2.5 mg via ORAL
  Filled 2013-07-16 (×12): qty 0.5

## 2013-07-16 MED ORDER — TRAZODONE HCL 50 MG PO TABS
50.0000 mg | ORAL_TABLET | Freq: Every day | ORAL | Status: DC
  Filled 2013-07-16: qty 1

## 2013-07-16 MED ORDER — PNEUMOCOCCAL 13-VAL CONJ VACC IM SUSP: 0.5000 mL | Freq: Once | INTRAMUSCULAR | Status: DC

## 2013-07-16 MED ORDER — LORATADINE 10 MG PO TABS
10.0000 mg | ORAL_TABLET | Freq: Every day | ORAL | Status: DC
  Administered 2013-07-17 – 2013-07-22 (×6): 10 mg via ORAL
  Filled 2013-07-16 (×7): qty 1

## 2013-07-16 MED ORDER — FLUTICASONE PROPIONATE 50 MCG/ACT NA SUSP
2.0000 | Freq: Every day | NASAL | Status: DC
  Administered 2013-07-17 – 2013-07-22 (×3): 2 via NASAL
  Filled 2013-07-16 (×2): qty 16

## 2013-07-16 MED ORDER — OXYBUTYNIN CHLORIDE 5 MG PO TABS
2.5000 mg | ORAL_TABLET | Freq: Two times a day (BID) | ORAL | Status: DC
  Filled 2013-07-16 (×2): qty 0.5

## 2013-07-16 MED ORDER — SENNA 8.6 MG PO TABS
1.0000 | ORAL_TABLET | Freq: Two times a day (BID) | ORAL | Status: DC
  Administered 2013-07-17 – 2013-07-22 (×10): 8.6 mg via ORAL
  Filled 2013-07-16 (×15): qty 1

## 2013-07-16 MED ORDER — PROPOFOL 10 MG/ML IV EMUL
INTRAVENOUS | Status: AC
  Filled 2013-07-16: qty 100

## 2013-07-16 MED ORDER — DEXTROSE 5 % IV SOLN
3.0000 g | INTRAVENOUS | Status: DC | PRN
  Administered 2013-07-16: 3 g via INTRAVENOUS

## 2013-07-16 MED ORDER — SODIUM CHLORIDE 0.9 % IV SOLN
250.0000 mL | INTRAVENOUS | Status: DC
  Administered 2013-07-16: 250 mL via INTRAVENOUS

## 2013-07-16 MED ORDER — PREDNISONE 20 MG PO TABS: 40.0000 mg | ORAL_TABLET | Freq: Two times a day (BID) | ORAL | Status: DC

## 2013-07-16 MED ORDER — FLEET ENEMA 7-19 GM/118ML RE ENEM
1.0000 | ENEMA | Freq: Once | RECTAL | Status: AC | PRN
  Filled 2013-07-16: qty 1

## 2013-07-16 SURGICAL SUPPLY — 70 items
ALLOGRAFT LORDOTIC 8X11X14 (Bone Implant) ×3 IMPLANT
ALLOGRAFT LORDOTIC CC 7X11X14 (Bone Implant) ×3 IMPLANT
ALLOGRAFT TRIAD LORDOTIC CC (Bone Implant) ×3 IMPLANT
APL SKNCLS STERI-STRIP NONHPOA (GAUZE/BANDAGES/DRESSINGS)
BAG DECANTER FOR FLEXI CONT (MISCELLANEOUS) ×3 IMPLANT
BANDAGE GAUZE ELAST BULKY 4 IN (GAUZE/BANDAGES/DRESSINGS) ×6 IMPLANT
BENZOIN TINCTURE PRP APPL 2/3 (GAUZE/BANDAGES/DRESSINGS) IMPLANT
BIT DRILL NEURO 2X3.1 SFT TUCH (MISCELLANEOUS) ×1 IMPLANT
BLADE ULTRA TIP 2M (BLADE) IMPLANT
BUR BARREL STRAIGHT FLUTE 4.0 (BURR) ×3 IMPLANT
CANISTER SUCT 3000ML (MISCELLANEOUS) ×3 IMPLANT
CLOSURE WOUND 1/2 X4 (GAUZE/BANDAGES/DRESSINGS)
CONT SPEC 4OZ CLIKSEAL STRL BL (MISCELLANEOUS) ×6 IMPLANT
COVER MAYO STAND STRL (DRAPES) ×3 IMPLANT
DERMABOND ADVANCED (GAUZE/BANDAGES/DRESSINGS) ×2
DERMABOND ADVANCED .7 DNX12 (GAUZE/BANDAGES/DRESSINGS) ×1 IMPLANT
DRAPE LAPAROTOMY 100X72 PEDS (DRAPES) ×3 IMPLANT
DRAPE MICROSCOPE LEICA (MISCELLANEOUS) ×3 IMPLANT
DRAPE POUCH INSTRU U-SHP 10X18 (DRAPES) ×3 IMPLANT
DRAPE PROXIMA HALF (DRAPES) IMPLANT
DRESSING TELFA 8X3 (GAUZE/BANDAGES/DRESSINGS) IMPLANT
DRILL BIT HELIX (BIT) ×3 IMPLANT
DRILL NEURO 2X3.1 SOFT TOUCH (MISCELLANEOUS) ×3
DRSG OPSITE POSTOP 4X6 (GAUZE/BANDAGES/DRESSINGS) ×3 IMPLANT
DURAPREP 6ML APPLICATOR 50/CS (WOUND CARE) ×3 IMPLANT
ELECT COATED BLADE 2.86 ST (ELECTRODE) ×3 IMPLANT
ELECT REM PT RETURN 9FT ADLT (ELECTROSURGICAL) ×3
ELECTRODE REM PT RTRN 9FT ADLT (ELECTROSURGICAL) ×1 IMPLANT
GAUZE SPONGE 4X4 16PLY XRAY LF (GAUZE/BANDAGES/DRESSINGS) IMPLANT
GLOVE BIO SURGEON STRL SZ7 (GLOVE) ×9 IMPLANT
GLOVE BIO SURGEON STRL SZ8 (GLOVE) ×3 IMPLANT
GLOVE BIOGEL PI IND STRL 8 (GLOVE) ×1 IMPLANT
GLOVE BIOGEL PI IND STRL 8.5 (GLOVE) ×1 IMPLANT
GLOVE BIOGEL PI INDICATOR 8 (GLOVE) ×2
GLOVE BIOGEL PI INDICATOR 8.5 (GLOVE) ×2
GLOVE ECLIPSE 8.0 STRL XLNG CF (GLOVE) ×3 IMPLANT
GLOVE EXAM NITRILE LRG STRL (GLOVE) IMPLANT
GLOVE EXAM NITRILE MD LF STRL (GLOVE) ×3 IMPLANT
GLOVE EXAM NITRILE XL STR (GLOVE) IMPLANT
GLOVE EXAM NITRILE XS STR PU (GLOVE) IMPLANT
GLOVE INDICATOR 7.5 STRL GRN (GLOVE) ×3 IMPLANT
GOWN BRE IMP SLV AUR LG STRL (GOWN DISPOSABLE) IMPLANT
GOWN BRE IMP SLV AUR XL STRL (GOWN DISPOSABLE) IMPLANT
GOWN L4 LG 24 PK N/S (GOWN DISPOSABLE) ×9 IMPLANT
GOWN STRL REIN 2XL LVL4 (GOWN DISPOSABLE) IMPLANT
HEAD HALTER (SOFTGOODS) ×3 IMPLANT
KIT BASIN OR (CUSTOM PROCEDURE TRAY) ×3 IMPLANT
KIT ROOM TURNOVER OR (KITS) ×3 IMPLANT
NEEDLE HYPO 18GX1.5 BLUNT FILL (NEEDLE) IMPLANT
NEEDLE HYPO 25X1 1.5 SAFETY (NEEDLE) ×3 IMPLANT
NEEDLE SPNL 22GX3.5 QUINCKE BK (NEEDLE) ×3 IMPLANT
NS IRRIG 1000ML POUR BTL (IV SOLUTION) ×3 IMPLANT
PACK LAMINECTOMY NEURO (CUSTOM PROCEDURE TRAY) ×3 IMPLANT
PAD ARMBOARD 7.5X6 YLW CONV (MISCELLANEOUS) ×9 IMPLANT
PIN DISTRACTION 14MM (PIN) ×6 IMPLANT
PLATE ARCHON 3-LEVEL 58MM (Plate) ×3 IMPLANT
RUBBERBAND STERILE (MISCELLANEOUS) ×6 IMPLANT
SCREW ARCHON SELFTAP 4.0X13 (Screw) ×24 IMPLANT
SPONGE GAUZE 4X4 12PLY (GAUZE/BANDAGES/DRESSINGS) IMPLANT
SPONGE INTESTINAL PEANUT (DISPOSABLE) ×3 IMPLANT
SPONGE SURGIFOAM ABS GEL SZ50 (HEMOSTASIS) ×3 IMPLANT
STAPLER SKIN PROX WIDE 3.9 (STAPLE) IMPLANT
STRIP CLOSURE SKIN 1/2X4 (GAUZE/BANDAGES/DRESSINGS) IMPLANT
SUT VIC AB 3-0 SH 8-18 (SUTURE) ×6 IMPLANT
SYR 20ML ECCENTRIC (SYRINGE) ×3 IMPLANT
SYR 3ML LL SCALE MARK (SYRINGE) IMPLANT
TOWEL OR 17X24 6PK STRL BLUE (TOWEL DISPOSABLE) ×3 IMPLANT
TOWEL OR 17X26 10 PK STRL BLUE (TOWEL DISPOSABLE) ×3 IMPLANT
TRAP SPECIMEN MUCOUS 40CC (MISCELLANEOUS) ×3 IMPLANT
WATER STERILE IRR 1000ML POUR (IV SOLUTION) ×3 IMPLANT

## 2013-07-16 NOTE — H&P (Signed)
Erline Levine, MD Physician Signed Neurosurgery Consult Note Service date: 07/16/2013 12:48 AM   Reason for Consult:Quadriparesis  Referring Physician: Ward  Larry Sierras Quiroa is an 79 y.o. female.  ENI:DPOEUMP is a 79 year old female with history of hypertension, COPD, obstructive sleep apnea, morbid obesity who presents emergency Department with pain in between her shoulder blades that she is unable to describe that started on Monday, 3 days ago. She denies any aggravating or relieving factors. She reports that when she moves she feels tingling and electric shooting pains in her arms and legs. She feels that her arms and legs are very weak she is unable to walk normally. She denies any shortness of breath but did have nausea and vomiting in the emergency department today which she thinks is due to taking Vicodin. No diaphoresis. No history of injury to the back or increased exertion. No chest pain. No bowel or bladder incontinence or urinary retention. She was seen at [facility] emergency department 2 days ago states she had a full workup including a CT scan. During her course in the ER, the patient developed progressive quadriparessi to the point that she is barely able to move her legs and cannot move her hands. She says that at its worst the pain between her shoulders has been 50/10. Previously, she was not complained of a fever, but in the ER, her temperature has been 102.2 and her WBC is 14 K.      Past Medical History    Diagnosis  Date    .  Hypertension     .  Asthma     .  Cancer       Skin Cancer    .  Sleep apnea        Past Surgical History    Procedure  Laterality  Date    .  Gastric bypass       History reviewed. No pertinent family history.  Social History: reports that she has never smoked. She does not have any smokeless tobacco history on file. She reports that she does not drink alcohol. Her drug history is not on file.  Allergies:    Allergies    Allergen   Reactions    .  Codeine  Nausea Only    .  Iohexol  Itching      07-15-13 pt developed itching on fingers after contrast given. Dr. Irish Elders looked at pt and said to put in system as allergy. BB    .  Synvisc [Hylan G-F 20]      Medications: I have reviewed the patient's current medications.    Results for orders placed during the hospital encounter of 07/15/13 (from the past 48 hour(s))    APTT Status: Abnormal     Collection Time     07/15/13 2:50 PM    Result  Value  Ref Range     aPTT  <20 (*)  24 - 37 seconds     Comment:  SPECIMEN CHECKED FOR CLOTS    PROTIME-INR Status: None     Collection Time     07/15/13 2:50 PM    Result  Value  Ref Range     Prothrombin Time  12.1  11.6 - 15.2 seconds     INR  0.91  0.00 - 1.49    CBC WITH DIFFERENTIAL Status: Abnormal     Collection Time     07/15/13 5:40 PM    Result  Value  Ref Range  WBC  14.6 (*)  4.0 - 10.5 K/uL     RBC  3.95  3.87 - 5.11 MIL/uL     Hemoglobin  12.6  12.0 - 15.0 g/dL     HCT  37.7  36.0 - 46.0 %     MCV  95.4  78.0 - 100.0 fL     MCH  31.9  26.0 - 34.0 pg     MCHC  33.4  30.0 - 36.0 g/dL     RDW  14.1  11.5 - 15.5 %     Platelets  PLATELET CLUMPS NOTED ON SMEAR, UNABLE TO ESTIMATE  150 - 400 K/uL     Neutrophils Relative %  89 (*)  43 - 77 %     Lymphocytes Relative  5 (*)  12 - 46 %     Monocytes Relative  6  3 - 12 %     Eosinophils Relative  0  0 - 5 %     Basophils Relative  0  0 - 1 %     Neutro Abs  13.0 (*)  1.7 - 7.7 K/uL     Lymphs Abs  0.7  0.7 - 4.0 K/uL     Monocytes Absolute  0.9  0.1 - 1.0 K/uL     Eosinophils Absolute  0.0  0.0 - 0.7 K/uL     Basophils Absolute  0.0  0.0 - 0.1 K/uL     Smear Review  MORPHOLOGY UNREMARKABLE     COMPREHENSIVE METABOLIC PANEL Status: Abnormal     Collection Time     07/15/13 5:40 PM    Result  Value  Ref Range     Sodium  136 (*)  137 - 147 mEq/L     Potassium  4.2  3.7 - 5.3 mEq/L     Chloride  94 (*)  96 - 112 mEq/L     CO2  25  19 - 32 mEq/L      Glucose, Bld  114 (*)  70 - 99 mg/dL     BUN  28 (*)  6 - 23 mg/dL     Creatinine, Ser  0.87  0.50 - 1.10 mg/dL     Calcium  9.8  8.4 - 10.5 mg/dL     Total Protein  8.1  6.0 - 8.3 g/dL     Albumin  3.4 (*)  3.5 - 5.2 g/dL     AST  27  0 - 37 U/L     ALT  18  0 - 35 U/L     Alkaline Phosphatase  79  39 - 117 U/L     Total Bilirubin  0.5  0.3 - 1.2 mg/dL     GFR calc non Af Amer  63 (*)  >90 mL/min     GFR calc Af Amer  73 (*)  >90 mL/min     Comment:  (NOTE)      The eGFR has been calculated using the CKD EPI equation.      This calculation has not been validated in all clinical situations.      eGFR's persistently <90 mL/min signify possible Chronic Kidney      Disease.    LIPASE, BLOOD Status: None     Collection Time     07/15/13 5:40 PM    Result  Value  Ref Range     Lipase  19  11 - 59 U/L    TROPONIN I Status: None  Collection Time     07/15/13 5:50 PM    Result  Value  Ref Range     Troponin I  <0.30  <0.30 ng/mL     Comment:       Due to the release kinetics of cTnI,      a negative result within the first hours      of the onset of symptoms does not rule out      myocardial infarction with certainty.      If myocardial infarction is still suspected,      repeat the test at appropriate intervals.    SAMPLE TO BLOOD BANK Status: None     Collection Time     07/15/13 6:46 PM    Result  Value  Ref Range     Blood Bank Specimen  SAMPLE AVAILABLE FOR TESTING      Sample Expiration  07/16/2013     I-STAT CHEM 8, ED Status: Abnormal     Collection Time     07/15/13 7:04 PM    Result  Value  Ref Range     Sodium  136 (*)  137 - 147 mEq/L     Potassium  4.1  3.7 - 5.3 mEq/L     Chloride  98  96 - 112 mEq/L     BUN  33 (*)  6 - 23 mg/dL     Creatinine, Ser  1.10  0.50 - 1.10 mg/dL     Glucose, Bld  121 (*)  70 - 99 mg/dL     Calcium, Ion  1.11 (*)  1.13 - 1.30 mmol/L     TCO2  30  0 - 100 mmol/L     Hemoglobin  14.6  12.0 - 15.0 g/dL     HCT  43.0  36.0 - 46.0 %     URINALYSIS, ROUTINE W REFLEX MICROSCOPIC Status: Abnormal     Collection Time     07/15/13 10:20 PM    Result  Value  Ref Range     Color, Urine  AMBER (*)  YELLOW     Comment:  BIOCHEMICALS MAY BE AFFECTED BY COLOR     APPearance  CLEAR  CLEAR     Specific Gravity, Urine  1.015  1.005 - 1.030     pH  6.0  5.0 - 8.0     Glucose, UA  NEGATIVE  NEGATIVE mg/dL     Hgb urine dipstick  LARGE (*)  NEGATIVE     Bilirubin Urine  SMALL (*)  NEGATIVE     Ketones, ur  NEGATIVE  NEGATIVE mg/dL     Protein, ur  100 (*)  NEGATIVE mg/dL     Urobilinogen, UA  1.0  0.0 - 1.0 mg/dL     Nitrite  NEGATIVE  NEGATIVE     Leukocytes, UA  NEGATIVE  NEGATIVE    URINE MICROSCOPIC-ADD ON Status: Abnormal     Collection Time     07/15/13 10:20 PM    Result  Value  Ref Range     Squamous Epithelial / LPF  FEW (*)  RARE     WBC, UA  3-6  <3 WBC/hpf     RBC / HPF  7-10  <3 RBC/hpf     Casts  HYALINE CASTS (*)  NEGATIVE     Urine-Other  RARE YEAST      Dg Chest 2 View  07/15/2013 CLINICAL DATA: History of asthma and hypertension now with pain. Between  the shoulder blades EXAM: CHEST 2 VIEW COMPARISON: None. FINDINGS: The lungs are reasonably well inflated. The interstitial markings are increased bilaterally. The pulmonary vascularity is mildly prominent. The cardiopericardial silhouette is enlarged. There is tortuosity of the ascending and descending thoracic aorta. Linear density lateral to the cardiac apex may reflect atelectasis or scarring. There is no pleural effusion. The thoracic vertebral bodies are preserved in height. IMPRESSION: 1. Increased interstitial markings bilaterally may reflect low-grade interstitial edema secondary to CHF. 2. Prominence of the ascending and descending thoracic aorta is normal. There are no previous studies with which to compare. Further evaluation of the chest with CT scanning would be of value. Electronically Signed By: David Martinique On: 07/15/2013 17:48  Ct Angio Chest W/cm &/or  Wo Cm  07/15/2013 CLINICAL DATA: Pain between shoulder blades extending to the mid back. The patient developed itching after contrast injection suggesting and allergic reaction. The patient was seen by Dr. Jeannine Kitten. The symptoms resolved. EXAM: CT ANGIOGRAPHY CHEST, ABDOMEN AND PELVIS TECHNIQUE: Multidetector CT imaging through the chest, abdomen and pelvis was performed using the standard protocol during bolus administration of intravenous contrast. Multiplanar reconstructed images and MIPs were obtained and reviewed to evaluate the vascular anatomy. CONTRAST: 135m OMNIPAQUE IOHEXOL 350 MG/ML SOLN COMPARISON: Two-view chest x-ray 07/15/2013. FINDINGS: CTA CHEST FINDINGS Coronary artery calcifications are evident on the precontrast study. The heart is mildly enlarged. The postcontrast images demonstrating normal appearance of the aorta. The aortic root an arch are within normal limits. A standard 3 vessel arch configuration is present. There is mild tortuosity of the proximal right common carotid artery in the proximal left vertebral artery. No significant pulmonary filling defects are present. No significant mediastinal or axillary adenopathy is present. Diffuse ground-glass attenuation is present in the lungs bilaterally. Ill-defined airspace disease is present on the left. This likely reflects atelectasis. The ground-glass opacities represent either atelectasis or edema. A small left pleural effusion is present. The bone windows demonstrate mild endplate changes. No focal lytic or blastic lesions are evident. Review of the MIP images confirms the above findings. CTA ABDOMEN AND PELVIS FINDINGS Atherosclerotic calcifications are present at the celiac and superior mesenteric origins. There is no significant stenosis. The left renal artery scratch the bilateral renal artery calcifications are present without significant stenosis. The aortic bifurcation and branch vessels are within normal limits. The liver and spleen  are within normal limits. The stomach, duodenum, and pancreas are unremarkable. The common bile duct is within normal limits. The gallbladder is mostly collapsed. The adrenal glands and right kidney are normal bilaterally. A nonobstructing stone is present at the lower pole of the left kidney. The rectosigmoid colon is within normal limits. The remainder the colon is unremarkable. The appendix is not discretely visualized and may be surgically absent. The small bowel is unremarkable. The urinary bladder is within normal limits. Fat extends into the inguinal canal bilaterally without associated bowel. The bone windows demonstrate degenerative change with vacuum phenomena in the disc spaces at L3-4, L4-5, and L5-S1. There is gas in the disc space at L1-2. Multilevel facet degenerative changes are present. Review of the MIP images confirms the above findings. IMPRESSION: 1. Atherosclerotic changes within the thoraco abdominal aorta without aneurysm. 2. No evidence for dissection or embolus. 3. Diffuse ground-glass attenuation throughout the lungs. This likely represents atelectasis or edema. 4. Ill-defined airspace disease in the left likely reflects atelectasis. 5. Small left pleural effusion. 6. Mild cardiomegaly. 7. Nonobstructing stone at the lower pole of the left  kidney. 8. Fat extends into the inguinal canals bilaterally without associated bowel. 9. Multilevel degenerative changes in the thoracolumbar spine. Electronically Signed By: Lawrence Santiago M.D. On: 07/15/2013 20:16  Mr Cervical Spine Wo Contrast  07/16/2013 CLINICAL DATA: Pain between shoulder blades radiating into the neck and down back with lower extremity weakness. EXAM: MRI CERVICAL AND THORACIC SPINE WITHOUT CONTRAST TECHNIQUE: Multiplanar and multiecho pulse sequences of the cervical spine, to include the craniocervical junction and cervicothoracic junction, and thoracic spine, were obtained without intravenous contrast. COMPARISON: None available  FINDINGS: MRI CERVICAL SPINE FINDINGS Study is degraded by motion artifact and body habitus. Cerebral atrophy noted within the partially visualized brain. Hyperintense T2 signal intensity within the pons is noted, which may be related to underlying chronic small vessel disease. Visualized brain is otherwise unremarkable. Craniocervical junction is patent. There is reversal of the normal cervical lordosis with apex at C4-5. Vertebral body heights are preserved. Signal intensity within the vertebral body bone marrow and spinal cord is within normal limits. At C2-3, there is broad-based degenerative disc osteophyte with bilateral facet arthrosis, right greater than left. There is resultant mild bilateral foraminal narrowing without central canal stenosis. At C3-4, there is broad-based degenerative disc osteophyte with bilateral uncovertebral osteophytosis. Posterior disc osteophyte partially flattens and effaces the ventral thecal sac resulting in mild canal stenosis. Probable mild bilateral foraminal narrowing is present as well. At C4-5, there is broad-based degenerative disc osteophyte with bilateral uncovertebral osteophytosis. On sagittal projection, there is fairly severe central canal stenosis with loss of ventral and dorsal CSF signal. No definite cord signal changes are identified, however, evaluation is limited on this examination. There is likely severe left with mild to moderate right foraminal stenosis. At C5-6, there is broad-based degenerative disc osteophyte with bilateral uncovertebral osteophytosis. On sagittal projection, there is severe central canal stenosis with loss of ventral and dorsal CSF signal intensity. No definite associated cord signal changes identified, although evaluation is somewhat limited on this examination. At least moderate bilateral foraminal stenosis is suspected, poorly evaluated due to motion artifact. At C6-7, there is broad-based degenerative disc osteophyte with bilateral  uncovertebral osteophytosis and facet arthrosis. On sagittal projection, there is fairly severe central canal stenosis with loss of dorsal and ventral CSF signal. No definite cord signal changes. Probable mild bilateral foraminal narrowing. At C7-T1, there is broad-based degenerative disc osteophyte resulting and moderate central canal stenosis. No definite foraminal narrowing. Visualized soft tissues of the neck are grossly within normal limits. MRI THORACIC SPINE FINDINGS There is mild dextroscoliosis of the thoracic spine. No listhesis. Normal thoracic kyphosis is preserved. Vertebral body heights are preserved. No acute fracture. Signal intensity within the vertebral body bone marrow is somewhat patchy and heterogeneous in appearance without discrete osseous lesion. Prominent degenerative anterior osteophytes are seen anteriorly at T7-8, T8-9 T11-12, T12-L1, and L1-2. At T2-3, there is a small right paracentral disc protrusion without significant canal or foraminal stenosis (series 1400, image 7). At T3-4, there is mild diffuse disc bulge without significant canal or foraminal stenosis. At T4-5, there is diffuse degenerative disc bulging without focal disc protrusion. No canal or foraminal stenosis. At T5-6, there is diffuse degenerative disc bulge with a superimposed left paracentral disc protrusion which abuts the left ventral aspect of the thoracic spinal cord (series 14, image 16). There is resultant mild canal stenosis. No definite foraminal narrowing. At T6-7, small left paracentral disc protrusion indents the left ventral thecal sac without significant canal or foraminal stenosis. At T7-T8, there is  degenerative disc bulge without significant canal or foraminal stenosis. At T8-9, there is diffuse disc bulge with superimposed right paracentral disc osteophyte which indents the right ventral thecal sac and flattens the right ventral spinal cord (series 14, image 25). The central canal and neural foramina  remain widely patent. At T9-10, small central disc protrusion indents the ventral thecal sac without significant canal or foraminal stenosis. At T10-11, there is mild degenerative disc bulge, eccentric to the left which flattens the left ventral thecal sac and results in mild canal stenosis. No significant foraminal narrowing appreciated. At T11-T12 and T12-L1, no significant canal or foraminal stenosis. Visualized paraspinous soft tissues are within normal limits. Visualized lungs are grossly clear. IMPRESSION: MRI CERVICAL SPINE: 1. Multilevel degenerative disc disease with resultant severe central canal stenosis at C4 through C7 as above. No definite cord signal changes are identified at these levels, however, please note that evaluation is limited on this exam due to motion artifact and body habitus. 2. Additional multilevel degenerative disc disease as above. 3. Moderate atrophy within the partially visualized brain with probable small vessel ischemic changes within the pons, incompletely evaluated on this examination. MRI THORACIC SPINE: 1. No significant central canal stenosis or cord signal changes within the thoracic spinal cord. 2. Multilevel degenerative disc disease with superimposed focal disc protrusions with resultant mild canal narrowing at several levels as detailed above. Electronically Signed By: Jeannine Boga M.D. On: 07/16/2013 00:37  Mr Thoracic Spine Wo Contrast  07/16/2013 CLINICAL DATA: Pain between shoulder blades radiating into the neck and down back with lower extremity weakness. EXAM: MRI CERVICAL AND THORACIC SPINE WITHOUT CONTRAST TECHNIQUE: Multiplanar and multiecho pulse sequences of the cervical spine, to include the craniocervical junction and cervicothoracic junction, and thoracic spine, were obtained without intravenous contrast. COMPARISON: None available FINDINGS: MRI CERVICAL SPINE FINDINGS Study is degraded by motion artifact and body habitus. Cerebral atrophy noted  within the partially visualized brain. Hyperintense T2 signal intensity within the pons is noted, which may be related to underlying chronic small vessel disease. Visualized brain is otherwise unremarkable. Craniocervical junction is patent. There is reversal of the normal cervical lordosis with apex at C4-5. Vertebral body heights are preserved. Signal intensity within the vertebral body bone marrow and spinal cord is within normal limits. At C2-3, there is broad-based degenerative disc osteophyte with bilateral facet arthrosis, right greater than left. There is resultant mild bilateral foraminal narrowing without central canal stenosis. At C3-4, there is broad-based degenerative disc osteophyte with bilateral uncovertebral osteophytosis. Posterior disc osteophyte partially flattens and effaces the ventral thecal sac resulting in mild canal stenosis. Probable mild bilateral foraminal narrowing is present as well. At C4-5, there is broad-based degenerative disc osteophyte with bilateral uncovertebral osteophytosis. On sagittal projection, there is fairly severe central canal stenosis with loss of ventral and dorsal CSF signal. No definite cord signal changes are identified, however, evaluation is limited on this examination. There is likely severe left with mild to moderate right foraminal stenosis. At C5-6, there is broad-based degenerative disc osteophyte with bilateral uncovertebral osteophytosis. On sagittal projection, there is severe central canal stenosis with loss of ventral and dorsal CSF signal intensity. No definite associated cord signal changes identified, although evaluation is somewhat limited on this examination. At least moderate bilateral foraminal stenosis is suspected, poorly evaluated due to motion artifact. At C6-7, there is broad-based degenerative disc osteophyte with bilateral uncovertebral osteophytosis and facet arthrosis. On sagittal projection, there is fairly severe central canal  stenosis with loss of dorsal  and ventral CSF signal. No definite cord signal changes. Probable mild bilateral foraminal narrowing. At C7-T1, there is broad-based degenerative disc osteophyte resulting and moderate central canal stenosis. No definite foraminal narrowing. Visualized soft tissues of the neck are grossly within normal limits. MRI THORACIC SPINE FINDINGS There is mild dextroscoliosis of the thoracic spine. No listhesis. Normal thoracic kyphosis is preserved. Vertebral body heights are preserved. No acute fracture. Signal intensity within the vertebral body bone marrow is somewhat patchy and heterogeneous in appearance without discrete osseous lesion. Prominent degenerative anterior osteophytes are seen anteriorly at T7-8, T8-9 T11-12, T12-L1, and L1-2. At T2-3, there is a small right paracentral disc protrusion without significant canal or foraminal stenosis (series 1400, image 7). At T3-4, there is mild diffuse disc bulge without significant canal or foraminal stenosis. At T4-5, there is diffuse degenerative disc bulging without focal disc protrusion. No canal or foraminal stenosis. At T5-6, there is diffuse degenerative disc bulge with a superimposed left paracentral disc protrusion which abuts the left ventral aspect of the thoracic spinal cord (series 14, image 16). There is resultant mild canal stenosis. No definite foraminal narrowing. At T6-7, small left paracentral disc protrusion indents the left ventral thecal sac without significant canal or foraminal stenosis. At T7-T8, there is degenerative disc bulge without significant canal or foraminal stenosis. At T8-9, there is diffuse disc bulge with superimposed right paracentral disc osteophyte which indents the right ventral thecal sac and flattens the right ventral spinal cord (series 14, image 25). The central canal and neural foramina remain widely patent. At T9-10, small central disc protrusion indents the ventral thecal sac without significant  canal or foraminal stenosis. At T10-11, there is mild degenerative disc bulge, eccentric to the left which flattens the left ventral thecal sac and results in mild canal stenosis. No significant foraminal narrowing appreciated. At T11-T12 and T12-L1, no significant canal or foraminal stenosis. Visualized paraspinous soft tissues are within normal limits. Visualized lungs are grossly clear. IMPRESSION: MRI CERVICAL SPINE: 1. Multilevel degenerative disc disease with resultant severe central canal stenosis at C4 through C7 as above. No definite cord signal changes are identified at these levels, however, please note that evaluation is limited on this exam due to motion artifact and body habitus. 2. Additional multilevel degenerative disc disease as above. 3. Moderate atrophy within the partially visualized brain with probable small vessel ischemic changes within the pons, incompletely evaluated on this examination. MRI THORACIC SPINE: 1. No significant central canal stenosis or cord signal changes within the thoracic spinal cord. 2. Multilevel degenerative disc disease with superimposed focal disc protrusions with resultant mild canal narrowing at several levels as detailed above. Electronically Signed By: Jeannine Boga M.D. On: 07/16/2013 00:37  Ct Cta Abd/pel W/cm &/or W/o Cm  07/15/2013 CLINICAL DATA: Pain between shoulder blades extending to the mid back. The patient developed itching after contrast injection suggesting and allergic reaction. The patient was seen by Dr. Jeannine Kitten. The symptoms resolved. EXAM: CT ANGIOGRAPHY CHEST, ABDOMEN AND PELVIS TECHNIQUE: Multidetector CT imaging through the chest, abdomen and pelvis was performed using the standard protocol during bolus administration of intravenous contrast. Multiplanar reconstructed images and MIPs were obtained and reviewed to evaluate the vascular anatomy. CONTRAST: 174m OMNIPAQUE IOHEXOL 350 MG/ML SOLN COMPARISON: Two-view chest x-ray 07/15/2013.  FINDINGS: CTA CHEST FINDINGS Coronary artery calcifications are evident on the precontrast study. The heart is mildly enlarged. The postcontrast images demonstrating normal appearance of the aorta. The aortic root an arch are within normal limits. A standard 3  vessel arch configuration is present. There is mild tortuosity of the proximal right common carotid artery in the proximal left vertebral artery. No significant pulmonary filling defects are present. No significant mediastinal or axillary adenopathy is present. Diffuse ground-glass attenuation is present in the lungs bilaterally. Ill-defined airspace disease is present on the left. This likely reflects atelectasis. The ground-glass opacities represent either atelectasis or edema. A small left pleural effusion is present. The bone windows demonstrate mild endplate changes. No focal lytic or blastic lesions are evident. Review of the MIP images confirms the above findings. CTA ABDOMEN AND PELVIS FINDINGS Atherosclerotic calcifications are present at the celiac and superior mesenteric origins. There is no significant stenosis. The left renal artery scratch the bilateral renal artery calcifications are present without significant stenosis. The aortic bifurcation and branch vessels are within normal limits. The liver and spleen are within normal limits. The stomach, duodenum, and pancreas are unremarkable. The common bile duct is within normal limits. The gallbladder is mostly collapsed. The adrenal glands and right kidney are normal bilaterally. A nonobstructing stone is present at the lower pole of the left kidney. The rectosigmoid colon is within normal limits. The remainder the colon is unremarkable. The appendix is not discretely visualized and may be surgically absent. The small bowel is unremarkable. The urinary bladder is within normal limits. Fat extends into the inguinal canal bilaterally without associated bowel. The bone windows demonstrate degenerative  change with vacuum phenomena in the disc spaces at L3-4, L4-5, and L5-S1. There is gas in the disc space at L1-2. Multilevel facet degenerative changes are present. Review of the MIP images confirms the above findings. IMPRESSION: 1. Atherosclerotic changes within the thoraco abdominal aorta without aneurysm. 2. No evidence for dissection or embolus. 3. Diffuse ground-glass attenuation throughout the lungs. This likely represents atelectasis or edema. 4. Ill-defined airspace disease in the left likely reflects atelectasis. 5. Small left pleural effusion. 6. Mild cardiomegaly. 7. Nonobstructing stone at the lower pole of the left kidney. 8. Fat extends into the inguinal canals bilaterally without associated bowel. 9. Multilevel degenerative changes in the thoracolumbar spine. Electronically Signed By: Lawrence Santiago M.D. On: 07/15/2013 20:16   Review of Systems - Negative except arthritis in knees with bilateral knee replacements and prior Gastric bypass  Blood pressure 142/65, pulse 89, temperature 98.4 F (36.9 C), temperature source Oral, resp. rate 19, SpO2 94.00%.  Physical Exam  Constitutional: She is oriented to person, place, and time. She appears well-developed and well-nourished.  HENT:  Head: Normocephalic and atraumatic.  Eyes: EOM are normal. Pupils are equal, round, and reactive to light.  Neck: Spinous process tenderness and muscular tenderness present.  Cardiovascular: Normal rate and regular rhythm.  Respiratory: Effort normal and breath sounds normal.  GI: Soft. Normal appearance. There is no tenderness.  Neurological: She is alert and oriented to person, place, and time. She displays normal reflexes. A sensory deficit is present. No cranial nerve deficit. She exhibits normal muscle tone. Coordination and gait normal. GCS eye subscore is 4. GCS verbal subscore is 5. GCS motor subscore is 6.  Motor examination reveals full bilateral Deltoid and biceps strength with Triceps 1/5  bilaterally and no functional movement in hands. Her legs have rotation at the hips, but no antigravity strength. She notes numbness in both hands and legs. Absent rectal tone.  Assessment/Plan:  Patient has rapidly progressive deterioration of neurologic exam with quadriparesis. I reviewed her preoperative cervical MRI with Dr. Jeannine Kitten, which is of poor technical quality due  to motion artifact, and there appears to abnormal tissue in the cervical prevertebral space and cord compression most severe at C 56 and also at C 45 and C 67 levels, mostly ventral to cord. There is increased signal in cervical cord on STIR images. There does not appear to be significant abnormality in the thoracic spine other than arthritis. 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 While we could obtain better imaging and contrasted images, I believe time is of the essence and have therefore recommended to patient that we proceed to surgery with the goal of decompressing her cervical spinal cord. We will therefore proceed with ACDF C 45, C 56, C 67 levels. Patient and family are aware of risks and concerns that she may not improve, particularly if she is having a cord infarct. Nonetheless, they understand the severity of the problem and wish to proceed.  Peggyann Shoals, MD  07/16/2013, 12:48 AM

## 2013-07-16 NOTE — Progress Notes (Signed)
PT Cancellation Note  Patient Details Name: [name] MRN: [id number] DOB: 05-Feb-1935   Cancelled Treatment:    Reason Eval/Treat Not Completed: Medical issues which prohibited therapy.  Pt extubated late am. Will plan to evaluate in the am.  Thanks.    INGOLD,Danylah Holden 07/16/2013, 1:31 PM [facility] [phone] (pager)

## 2013-07-16 NOTE — Consult Note (Addendum)
[facility] for Infectious [facility]     Reason for Consult: epidural abscess    Referring Physician: Dr. Vertell Limber  Active Problems:   Spinal epidural abscess   Acute respiratory failure   OSA on CPAP   . [START ON 07/17/2013] aspirin  81 mg Oral Daily  . calcium carbonate  2 tablet Oral Q breakfast  . cefTRIAXone (ROCEPHIN)  IV  2 g Intravenous Q12H  . cholecalciferol  2,000 Units Oral Daily  . cyanocobalamin  500 mcg Oral Daily  . dexamethasone  4 mg Intravenous 4 times per day   Or  . dexamethasone  4 mg Oral 4 times per day  . [START ON 07/17/2013] docusate sodium  100 mg Oral BID  . ferrous fumarate  1 tablet Oral Once per day on Mon Wed Fri  . fluticasone  2 spray Each Nare Daily  . furosemide  20 mg Oral Daily  . lisinopril  5 mg Oral Daily  . loratadine  10 mg Oral Daily  . [START ON 07/17/2013] mometasone-formoterol  2 puff Inhalation BID  . multivitamin with minerals  1 tablet Oral Daily  . [START ON 07/17/2013] oxybutynin  2.5 mg Oral BID  . propofol      . [START ON 07/17/2013] senna  1 tablet Oral BID  . [START ON 07/17/2013] traZODone  50 mg Oral QHS  . vancomycin  1,500 mg Intravenous Once  . [START ON 07/17/2013] vancomycin  1,500 mg Intravenous Q24H    Recommendations: Continue with vancomycin and ceftriaxone   Assessment: She has an epidural abscess requiring emergent surgical decompression.  Gram stain shows GPC in pairs.  Cultures pending.   Dr. Megan Salon will follow up cultures over the weekend and adjust antibiotics as needed.    Antibiotics: Vancomycin and ceftriaxone  HPI: [name] is a 79 y.o. female with HTN, COPD, who had an onset of weakness and pain between her shoulder blades.  She initially went to [facility] but CT did not reveal any issues and came to ED here and she began to develop weakness and pain and tingling in her extremeties.  MRI concerning for epidural abscess and she was taken to the OR yesterday emergently for intervention, where  epidural abscess was confirmed.  She is now found extubated in neuro ICU.     Review of Systems: A comprehensive review of systems was negative.  Past Medical History  Diagnosis Date  . Hypertension   . Asthma   . Cancer     Skin Cancer  . Sleep apnea     History  Substance Use Topics  . Smoking status: Never Smoker   . Smokeless tobacco: Not on file  . Alcohol Use: No    History reviewed. No pertinent family history. Allergies  Allergen Reactions  . Codeine Nausea Only  . Iohexol Itching    07-15-13 pt developed itching on fingers after contrast given. Dr. Irish Elders looked at pt and said to put in system as allergy. BB  . Synvisc [Hylan G-F 20]     OBJECTIVE: Blood pressure 184/86, pulse 87, temperature 99.6 F (37.6 C), temperature source Axillary, resp. rate 15, height 5\' 3"  (1.6 m), weight 263 lb 7.2 oz (119.5 kg), SpO2 96.00%. General: Awake, answers appropriately Skin: no rashes Lungs: CTA B Cor: RRR without m Abdomen: soft, nt, nd Ext: no edema  Microbiology: Recent Results (from the past 240 hour(s))  CULTURE, ROUTINE-ABSCESS     Status: None   Collection Time  07/16/13  4:15 AM      Result Value Ref Range Status   Specimen Description ABSCESS LEFT NECK   Final   Special Requests NONE   Final   Gram Stain     Final   Value: ABUNDANT WBC PRESENT,BOTH PMN AND MONONUCLEAR     NO SQUAMOUS EPITHELIAL CELLS SEEN     ABUNDANT GRAM POSITIVE COCCI     IN PAIRS     Performed at [facility]   Culture PENDING   Incomplete   Report Status PENDING   Incomplete  ANAEROBIC CULTURE     Status: None   Collection Time    07/16/13  4:15 AM      Result Value Ref Range Status   Specimen Description ABSCESS LEFT NECK   Final   Special Requests NONE   Final   Gram Stain PENDING   Incomplete   Culture     Final   Value: NO ANAEROBES ISOLATED; CULTURE IN PROGRESS FOR 5 DAYS     Performed at [facility]   Report Status PENDING   Incomplete  ANAEROBIC  CULTURE     Status: None   Collection Time    07/16/13  4:19 AM      Result Value Ref Range Status   Specimen Description ABSCESS LEFT NECK   Final   Special Requests NONE   Final   Gram Stain     Final   Value: ABUNDANT WBC PRESENT,BOTH PMN AND MONONUCLEAR     NO SQUAMOUS EPITHELIAL CELLS SEEN     ABUNDANT GRAM POSITIVE COCCI     IN PAIRS     Performed at [facility]   Culture     Final   Value: NO ANAEROBES ISOLATED; CULTURE IN PROGRESS FOR 5 DAYS     Performed at [facility]   Report Status PENDING   Incomplete  TISSUE CULTURE     Status: None   Collection Time    07/16/13  4:24 AM      Result Value Ref Range Status   Specimen Description TISSUE LEFT NECK   Final   Special Requests NO 3 IN CUP   Final   Gram Stain     Final   Value: RARE WBC PRESENT, PREDOMINANTLY PMN     NO ORGANISMS SEEN     Performed at [facility]   Culture PENDING   Incomplete   Report Status PENDING   Incomplete  MRSA PCR SCREENING     Status: None   Collection Time    07/16/13  5:33 AM      Result Value Ref Range Status   MRSA by PCR NEGATIVE  NEGATIVE Final   Comment:            The GeneXpert MRSA Assay (FDA     approved for NASAL specimens     only), is one component of a     comprehensive MRSA colonization     surveillance program. It is not     intended to diagnose MRSA     infection nor to guide or     monitor treatment for     MRSA infections.    Scharlene Gloss, [facility] [place] [email or web address] [phone] pager  [phone] cell 07/16/2013, 3:43 PM

## 2013-07-16 NOTE — Progress Notes (Signed)
UR completed.  Kimberlea Schlag, RN BSN [facility] CCM Trauma/Neuro ICU Case Manager [phone]  

## 2013-07-16 NOTE — Progress Notes (Signed)
Pt refusing CPAP at this time. Pt stated she can't sleep if she can't be on her side, so she doesn't want to use CPAP. Advised pt if she changed her mind to notify RN and we would set one up for her. RT will continue to monitor.

## 2013-07-16 NOTE — Consult Note (Signed)
Name: [name] MRN: [id number] DOB: February 05, 1935    ADMISSION DATE:  07/15/2013 CONSULTATION DATE:  3/06    REFERRING MD :  Venetia Maxon PRIMARY SERVICE: NS  BRIEF PATIENT DESCRIPTION:  VDRF status post drainage of cervical epidural abscess and ACDF   LINES / TUBES:   CULTURES: Abscess drainage 3/05 >> abundant GPCs in pairs >>    ANTIBIOTICS: Vanc 3/05 >>  Ceftriaxone 3/05 >>   HISTORY OF PRESENT ILLNESS:   Pt intubated and unable to provide history. Admission and hospital notes reviewed.   PAST MEDICAL HISTORY :  Past Medical History  Diagnosis Date  . Hypertension   . Asthma   . Cancer     Skin Cancer  . Sleep apnea    Past Surgical History  Procedure Laterality Date  . Gastric bypass     Prior to Admission medications   Medication Sig Start Date End Date Taking? Authorizing Provider  acetaminophen (TYLENOL) 650 MG CR tablet Take 1,300 mg by mouth every 8 (eight) hours as needed for pain.    Yes Historical Provider, MD  aspirin 81 MG tablet Take 81 mg by mouth daily.     Yes Historical Provider, MD  calcium carbonate (OS-CAL - DOSED IN MG OF ELEMENTAL CALCIUM) 1250 MG tablet Take 2 tablets by mouth daily.    Yes Historical Provider, MD  Cholecalciferol (VITAMIN D) 2000 UNITS CAPS Take 2,000 Units by mouth daily.   Yes Historical Provider, MD  cyanocobalamin 500 MCG tablet Take 500 mcg by mouth daily.     Yes Historical Provider, MD  ferrous fumarate (HEMOCYTE - 106 MG FE) 325 (106 FE) MG TABS Take 1 tablet by mouth 3 (three) times a week.    Yes Historical Provider, MD  fluticasone (FLONASE) 50 MCG/ACT nasal spray Place 2 sprays into both nostrils daily.   Yes Historical Provider, MD  Fluticasone-Salmeterol (ADVAIR) 250-50 MCG/DOSE AEPB Inhale 1 puff into the lungs 2 (two) times daily.   Yes Historical Provider, MD  furosemide (LASIX) 20 MG tablet Take 20 mg by mouth daily.   Yes Historical Provider, MD  HYDROcodone-acetaminophen (NORCO/VICODIN) 5-325 MG per  tablet Take 1-2 tablets by mouth every 4 (four) hours as needed for moderate pain.  07/14/13  Yes Historical Provider, MD  ibuprofen (ADVIL,MOTRIN) 200 MG tablet Take 400 mg by mouth every 6 (six) hours as needed.   Yes Historical Provider, MD  lisinopril (PRINIVIL,ZESTRIL) 5 MG tablet Take 5 mg by mouth daily.  07/13/13  Yes Historical Provider, MD  loratadine (CLARITIN) 10 MG tablet Take 10 mg by mouth daily.   Yes Historical Provider, MD  Multiple Vitamins-Minerals (MULTIVITAMIN WITH MINERALS) tablet Take 1 tablet by mouth daily.     Yes Historical Provider, MD  naproxen sodium (ANAPROX) 220 MG tablet Take 220 mg by mouth 2 (two) times daily with a meal.   Yes Historical Provider, MD  oxybutynin (DITROPAN) 5 MG tablet Take 2.5 mg by mouth 2 (two) times daily.  06/15/13  Yes Historical Provider, MD  predniSONE (DELTASONE) 10 MG tablet Take 40 mg by mouth daily. Pt taking 40 mg a day for 3 days, then 30 mg a day for 3 days, 20 mg a day for 3 days, 10 mg a day for 3 days 07/14/13  Yes Historical Provider, MD  PREVNAR 13 SUSP injection Inject 0.5 mLs into the muscle once.  05/27/13  Yes Historical Provider, MD  traZODone (DESYREL) 50 MG tablet Take 50 mg by mouth at  bedtime.  06/30/13  Yes Historical Provider, MD  clotrimazole-betamethasone (LOTRISONE) cream  06/03/13   Historical Provider, MD   Allergies  Allergen Reactions  . Codeine Nausea Only  . Iohexol Itching    07-15-13 pt developed itching on fingers after contrast given. Dr. Irish Elders looked at pt and said to put in system as allergy. BB  . Synvisc [Hylan G-F 20]     FAMILY HISTORY:  History reviewed. No pertinent family history. SOCIAL HISTORY:  reports that she has never smoked. She does not have any smokeless tobacco history on file. She reports that she does not drink alcohol. Her drug history is not on file.  REVIEW OF SYSTEMS:  Per HPI  SUBJECTIVE:  Passed SBT. RASS -1 on low dose propofol. + F/C   VITAL SIGNS: Temp:  [98 F (36.7  C)-102.2 F (39 C)] 99.6 F (37.6 C) (03/06 1200) Pulse Rate:  [56-110] 88 (03/06 1400) Resp:  [14-25] 25 (03/06 1400) BP: (108-189)/(45-154) 181/154 mmHg (03/06 1400) SpO2:  [89 %-99 %] 98 % (03/06 1400) Arterial Line BP: (196-247)/(67-90) 221/85 mmHg (03/06 1000) FiO2 (%):  [40 %-50 %] 40 % (03/06 1000) Weight:  [119.5 kg (263 lb 7.2 oz)] 119.5 kg (263 lb 7.2 oz) (03/06 0700) HEMODYNAMICS:   VENTILATOR SETTINGS: Vent Mode:  [-] PSV FiO2 (%):  [40 %-50 %] 40 % Set Rate:  [16 bmp] 16 bmp Vt Set:  [420 mL] 420 mL PEEP:  [5 cmH20] 5 cmH20 Pressure Support:  [5 cmH20] 5 cmH20 Plateau Pressure:  [18 cmH20] 18 cmH20 INTAKE / OUTPUT: Intake/Output     03/05 0701 - 03/06 0700 03/06 0701 - 03/07 0700   I.V. (mL/kg) 2300.1 (19.2) 547.5 (4.6)   IV Piggyback  100   Total Intake(mL/kg) 2300.1 (19.2) 647.5 (5.4)   Urine (mL/kg/hr) 510 680 (0.7)   Drains  35 (0)   Blood 50    Total Output 560 715   Net +1740.1 -67.5          PHYSICAL EXAMINATION: General:  Obese, NAD Neuro:  Diffusely weak, MAEs Head/neck:  Surgical dressing claean and dry Cardiovascular:  RRR s M Lungs:  Clear anteriorly Abdomen:  Obese, soft, NT, NABS Ext: warm, no edema   LABS: I have reviewed all of today's lab results. Relevant abnormalities are discussed in the A/P section  CXR: 3/05 low volumes. Vasc crowding  ASSESSMENT / PLAN:  PULMONARY A: Acute resp failure OSA on nocturnal CPAP @ home P:   Extubate PRN O2 Nocturnal CPAP  CARDIOVASCULAR A: No issues P:  Monitor  RENAL A:  No issues P:   Monitor BMET intermittently Monitor I/Os Correct electrolytes as indicated   GASTROINTESTINAL A:  Obesity P:   SUP: N/I post extubation NPO post extubation Likely should have SLP eval prior to starting diet - will leave to NS to decide  HEMATOLOGIC A:  No issues P:  Monitor CBC intermittently  INFECTIOUS A:  Epidural abscess P:   Micro and abx as above ID consult has been  requested  ENDOCRINE A:  Risk of hyperglycemia (steroids and obesity) P:   CBGs q 4 hrs SSI for glu > 180  NEUROLOGIC A:  Quadriparesis - appears markedly improved post surgical drainage of abscess Post op pain P:   PRN analgesics ordered Post op care per NS   I have personally obtained a history, examined the patient, evaluated laboratory and imaging results, formulated the assessment and plan and placed orders. CRITICAL CARE: The patient is  critically ill with multiple organ systems failure and requires high complexity decision making for assessment and support, frequent evaluation and titration of therapies, application of advanced monitoring technologies and extensive interpretation of multiple databases. Critical Care Time devoted to patient care services described in this note is 35 minutes.   Merton Border, MD ; [facility] [phone].  After 5:30 PM or weekends, call [phone] Pulmonary and [place] Pager: [phone]  07/16/2013, 2:39 PM

## 2013-07-16 NOTE — Progress Notes (Signed)
Subjective: Patient reports "I'm doing much better than I was!"  Objective: Vital signs in last 24 hours: Temp:  [98 F (36.7 C)-102.2 F (39 C)] 99.7 F (37.6 C) (03/06 0752) Pulse Rate:  [56-110] 88 (03/06 1100) Resp:  [14-23] 14 (03/06 1100) BP: (108-189)/(45-117) 189/82 mmHg (03/06 1100) SpO2:  [89 %-99 %] 97 % (03/06 1100) Arterial Line BP: (196-247)/(67-90) 221/85 mmHg (03/06 1000) FiO2 (%):  [40 %-50 %] 40 % (03/06 1000) Weight:  [119.5 kg (263 lb 7.2 oz)] 119.5 kg (263 lb 7.2 oz) (03/06 0700)  Intake/Output from previous day: 03/05 0701 - 03/06 0700 In: 2300.1 [I.V.:2300.1] Out: 560 [Urine:510; Blood:50] Intake/Output this shift: Total I/O In: 452.1 [I.V.:352.1; IV Piggyback:100] Out: 430 [Urine:410; Drains:20]  Alert, conversant. Extubated ~6min ago. Smiling, reporting "just a few aches" post-op. Improved strength all extremities. Strong BLE, Left >Right hand intrinsics, but now able to lift arms off bed, reach, and resist. Cervical incision with honeycomb drsg - no erythema, swelling, or drainage. JP ~20ml.   Lab Results:  Recent Labs  07/15/13 1740 07/15/13 1904  WBC 14.6*  --   HGB 12.6 14.6  HCT 37.7 43.0  PLT PLATELET CLUMPS NOTED ON SMEAR, UNABLE TO ESTIMATE  --    BMET  Recent Labs  07/15/13 1740 07/15/13 1904  NA 136* 136*  K 4.2 4.1  CL 94* 98  CO2 25  --   GLUCOSE 114* 121*  BUN 28* 33*  CREATININE 0.87 1.10  CALCIUM 9.8  --     Studies/Results: Dg Chest 2 View  07/15/2013   CLINICAL DATA:  History of asthma and hypertension now with pain. Between the shoulder blades  EXAM: CHEST  2 VIEW  COMPARISON:  None.  FINDINGS: The lungs are reasonably well inflated. The interstitial markings are increased bilaterally. The pulmonary vascularity is mildly prominent. The cardiopericardial silhouette is enlarged. There is tortuosity of the ascending and descending thoracic aorta. Linear density lateral to the cardiac apex may reflect atelectasis or  scarring. There is no pleural effusion. The thoracic vertebral bodies are preserved in height.  IMPRESSION: 1. Increased interstitial markings bilaterally may reflect low-grade interstitial edema secondary to CHF. 2. Prominence of the ascending and descending thoracic aorta is normal. There are no previous studies with which to compare. Further evaluation of the chest with CT scanning would be of value.   Electronically Signed   By: David  [place]   On: 07/15/2013 17:48   Dg Cervical Spine 2-3 Views  07/16/2013   CLINICAL DATA:  ACDF C4-C7  EXAM: CERVICAL SPINE - 2-3 VIEW  COMPARISON:  MRI 07/15/2013  FINDINGS: Intraoperative localization at the C3-4 level anteriorly. Enteric and endotracheal intubation.  IMPRESSION: Intraoperative localization at C3-4.   Electronically Signed   By: Tiburcio Pea M.D.   On: 07/16/2013 05:31   Ct Angio Chest W/cm &/or Wo Cm  07/15/2013   CLINICAL DATA:  Pain between shoulder blades extending to the mid back.  The patient developed itching after contrast injection suggesting and allergic reaction. The patient was seen by Dr. Constance Goltz. The symptoms resolved.  EXAM: CT ANGIOGRAPHY CHEST, ABDOMEN AND PELVIS  TECHNIQUE: Multidetector CT imaging through the chest, abdomen and pelvis was performed using the standard protocol during bolus administration of intravenous contrast. Multiplanar reconstructed images and MIPs were obtained and reviewed to evaluate the vascular anatomy.  CONTRAST:  OMNIPAQUE IOHEXOL 350 MG/ML SOLN  COMPARISON:  Two-view chest x-ray 07/15/2013.  FINDINGS: CTA CHEST FINDINGS  Coronary artery calcifications are  evident on the precontrast study. The heart is mildly enlarged.  The postcontrast images demonstrating normal appearance of the aorta. The aortic root an arch are within normal limits. A standard 3 vessel arch configuration is present. There is mild tortuosity of the proximal right common carotid artery in the proximal left vertebral artery. No  significant pulmonary filling defects are present.  No significant mediastinal or axillary adenopathy is present.  Diffuse ground-glass attenuation is present in the lungs bilaterally. Ill-defined airspace disease is present on the left. This likely reflects atelectasis. The ground-glass opacities represent either atelectasis or edema. A small left pleural effusion is present.  The bone windows demonstrate mild endplate changes. No focal lytic or blastic lesions are evident.  Review of the MIP images confirms the above findings.  CTA ABDOMEN AND PELVIS FINDINGS  Atherosclerotic calcifications are present at the celiac and superior mesenteric origins. There is no significant stenosis. The left renal artery scratch the bilateral renal artery calcifications are present without significant stenosis. The aortic bifurcation and branch vessels are within normal limits.  The liver and spleen are within normal limits. The stomach, duodenum, and pancreas are unremarkable. The common bile duct is within normal limits. The gallbladder is mostly collapsed. The adrenal glands and right kidney are normal bilaterally. A nonobstructing stone is present at the lower pole of the left kidney. The rectosigmoid colon is within normal limits. The remainder the colon is unremarkable. The appendix is not discretely visualized and may be surgically absent. The small bowel is unremarkable. The urinary bladder is within normal limits. Fat extends into the inguinal canal bilaterally without associated bowel.  The bone windows demonstrate degenerative change with vacuum phenomena in the disc spaces at L3-4, L4-5, and L5-S1. There is gas in the disc space at L1-2. Multilevel facet degenerative changes are present.  Review of the MIP images confirms the above findings.  IMPRESSION: 1. Atherosclerotic changes within the thoraco abdominal aorta without aneurysm. 2. No evidence for dissection or embolus. 3. Diffuse ground-glass attenuation throughout  the lungs. This likely represents atelectasis or edema. 4. Ill-defined airspace disease in the left likely reflects atelectasis. 5. Small left pleural effusion. 6. Mild cardiomegaly. 7. Nonobstructing stone at the lower pole of the left kidney. 8. Fat extends into the inguinal canals bilaterally without associated bowel. 9. Multilevel degenerative changes in the thoracolumbar spine.   Electronically Signed   By: Lawrence Santiago M.D.   On: 07/15/2013 20:16   Mr Cervical Spine Wo Contrast  07/16/2013   ADDENDUM REPORT: 07/16/2013 01:38  ADDENDUM: Initial report by Dr. Jeannine Boga.  Addendum by Dr. Jeannine Kitten.  Images review with Dr. Vertell Limber after clinical examination demonstrated rapid deteriorating weakness of upper arms.  The motion degraded cervical spine exam was reviewed with cervical spondylotic changes felt to be most prominent at the C4-5, C5-6 and C6-7 level and less notable above and below these regions. Evaluation of signal intensity of the cord is limited because of the motion degradation however cord appears posteriorly displaced by the cervical spondylotic changes.  Confounding factor of prevertebral T2 altered signal intensity raises the consideration of possible infection. No evidence of disc space T2 altered signal intensity as may be expected with discitis.  Obtaining contrast MR scan with repeat T2 weighted imaging with the patient under sedation as well as unenhanced CT scan of the cervical spine was discussed with Dr. Vertell Limber prior to surgery. These additional studies were not obtained secondary to patient's deteriorating clinical status.   Electronically Signed  By: Chauncey Cruel M.D.   On: 07/16/2013 01:38   07/16/2013   ADDENDUM REPORT: 07/16/2013 00:58  ADDENDUM: In addition to the initial findings common there is increased T2 signal intensity within the prevertebral soft tissues extending from C2 through C7-T1. The underlying anterior longitudinal ligament is grossly intact. This finding is of  uncertain etiology. Possible calcific tendinitis could be considered.   Electronically Signed   By: Jeannine Boga M.D.   On: 07/16/2013 00:58   07/16/2013   CLINICAL DATA:  Pain between shoulder blades radiating into the neck and down back with lower extremity weakness.  EXAM: MRI CERVICAL AND THORACIC SPINE WITHOUT CONTRAST  TECHNIQUE: Multiplanar and multiecho pulse sequences of the cervical spine, to include the craniocervical junction and cervicothoracic junction, and thoracic spine, were obtained without intravenous contrast.  COMPARISON:  None available  FINDINGS: MRI CERVICAL SPINE FINDINGS  Study is degraded by motion artifact and body habitus.  Cerebral atrophy noted within the partially visualized brain. Hyperintense T2 signal intensity within the pons is noted, which may be related to underlying chronic small vessel disease. Visualized brain is otherwise unremarkable. Craniocervical junction is patent.  There is reversal of the normal cervical lordosis with apex at C4-5. Vertebral body heights are preserved. Signal intensity within the vertebral body bone marrow and spinal cord is within normal limits.  At C2-3, there is broad-based degenerative disc osteophyte with bilateral facet arthrosis, right greater than left. There is resultant mild bilateral foraminal narrowing without central canal stenosis.  At C3-4, there is broad-based degenerative disc osteophyte with bilateral uncovertebral osteophytosis. Posterior disc osteophyte partially flattens and effaces the ventral thecal sac resulting in mild canal stenosis. Probable mild bilateral foraminal narrowing is present as well.  At C4-5, there is broad-based degenerative disc osteophyte with bilateral uncovertebral osteophytosis. On sagittal projection, there is fairly severe central canal stenosis with loss of ventral and dorsal CSF signal. No definite cord signal changes are identified, however, evaluation is limited on this examination. There is  likely severe left with mild to moderate right foraminal stenosis.  At C5-6, there is broad-based degenerative disc osteophyte with bilateral uncovertebral osteophytosis. On sagittal projection, there is severe central canal stenosis with loss of ventral and dorsal CSF signal intensity. No definite associated cord signal changes identified, although evaluation is somewhat limited on this examination. At least moderate bilateral foraminal stenosis is suspected, poorly evaluated due to motion artifact.  At C6-7, there is broad-based degenerative disc osteophyte with bilateral uncovertebral osteophytosis and facet arthrosis. On sagittal projection, there is fairly severe central canal stenosis with loss of dorsal and ventral CSF signal. No definite cord signal changes. Probable mild bilateral foraminal narrowing.  At C7-T1, there is broad-based degenerative disc osteophyte resulting and moderate central canal stenosis. No definite foraminal narrowing.  Visualized soft tissues of the neck are grossly within normal limits.  MRI THORACIC SPINE FINDINGS  There is mild dextroscoliosis of the thoracic spine. No listhesis. Normal thoracic kyphosis is preserved. Vertebral body heights are preserved. No acute fracture. Signal intensity within the vertebral body bone marrow is somewhat patchy and heterogeneous in appearance without discrete osseous lesion.  Prominent degenerative anterior osteophytes are seen anteriorly at T7-8, T8-9 T11-12, T12-L1, and L1-2.  At T2-3, there is a small right paracentral disc protrusion without significant canal or foraminal stenosis (series 1400, image 7).  At T3-4, there is mild diffuse disc bulge without significant canal or foraminal stenosis.  At T4-5, there is diffuse degenerative disc bulging without focal  disc protrusion. No canal or foraminal stenosis.  At T5-6, there is diffuse degenerative disc bulge with a superimposed left paracentral disc protrusion which abuts the left ventral  aspect of the thoracic spinal cord (series 14, image 16). There is resultant mild canal stenosis. No definite foraminal narrowing.  At T6-7, small left paracentral disc protrusion indents the left ventral thecal sac without significant canal or foraminal stenosis.  At T7-T8, there is degenerative disc bulge without significant canal or foraminal stenosis.  At T8-9, there is diffuse disc bulge with superimposed right paracentral disc osteophyte which indents the right ventral thecal sac and flattens the right ventral spinal cord (series 14, image 25). The central canal and neural foramina remain widely patent.  At T9-10, small central disc protrusion indents the ventral thecal sac without significant canal or foraminal stenosis.  At T10-11, there is mild degenerative disc bulge, eccentric to the left which flattens the left ventral thecal sac and results in mild canal stenosis. No significant foraminal narrowing appreciated.  At T11-T12 and T12-L1, no significant canal or foraminal stenosis.  Visualized paraspinous soft tissues are within normal limits. Visualized lungs are grossly clear.  IMPRESSION: MRI CERVICAL SPINE:  1. Multilevel degenerative disc disease with resultant severe central canal stenosis at C4 through C7 as above. No definite cord signal changes are identified at these levels, however, please note that evaluation is limited on this exam due to motion artifact and body habitus. 2. Additional multilevel degenerative disc disease as above. 3. Moderate atrophy within the partially visualized brain with probable small vessel ischemic changes within the pons, incompletely evaluated on this examination.  MRI THORACIC SPINE:  1. No significant central canal stenosis or cord signal changes within the thoracic spinal cord. 2. Multilevel degenerative disc disease with superimposed focal disc protrusions with resultant mild canal narrowing at several levels as detailed above.  Electronically Signed: By:  Jeannine Boga M.D. On: 07/16/2013 00:37   Mr Thoracic Spine Wo Contrast  07/16/2013   ADDENDUM REPORT: 07/16/2013 01:38  ADDENDUM: Initial report by Dr. Jeannine Boga.  Addendum by Dr. Jeannine Kitten.  Images review with Dr. Vertell Limber after clinical examination demonstrated rapid deteriorating weakness of upper arms.  The motion degraded cervical spine exam was reviewed with cervical spondylotic changes felt to be most prominent at the C4-5, C5-6 and C6-7 level and less notable above and below these regions. Evaluation of signal intensity of the cord is limited because of the motion degradation however cord appears posteriorly displaced by the cervical spondylotic changes.  Confounding factor of prevertebral T2 altered signal intensity raises the consideration of possible infection. No evidence of disc space T2 altered signal intensity as may be expected with discitis.  Obtaining contrast MR scan with repeat T2 weighted imaging with the patient under sedation as well as unenhanced CT scan of the cervical spine was discussed with Dr. Vertell Limber prior to surgery. These additional studies were not obtained secondary to patient's deteriorating clinical status.   Electronically Signed   By: Chauncey Cruel M.D.   On: 07/16/2013 01:38   07/16/2013   ADDENDUM REPORT: 07/16/2013 00:58  ADDENDUM: In addition to the initial findings common there is increased T2 signal intensity within the prevertebral soft tissues extending from C2 through C7-T1. The underlying anterior longitudinal ligament is grossly intact. This finding is of uncertain etiology. Possible calcific tendinitis could be considered.   Electronically Signed   By: Jeannine Boga M.D.   On: 07/16/2013 00:58   07/16/2013   CLINICAL DATA:  Pain between shoulder blades radiating into the neck and down back with lower extremity weakness.  EXAM: MRI CERVICAL AND THORACIC SPINE WITHOUT CONTRAST  TECHNIQUE: Multiplanar and multiecho pulse sequences of the cervical spine, to  include the craniocervical junction and cervicothoracic junction, and thoracic spine, were obtained without intravenous contrast.  COMPARISON:  None available  FINDINGS: MRI CERVICAL SPINE FINDINGS  Study is degraded by motion artifact and body habitus.  Cerebral atrophy noted within the partially visualized brain. Hyperintense T2 signal intensity within the pons is noted, which may be related to underlying chronic small vessel disease. Visualized brain is otherwise unremarkable. Craniocervical junction is patent.  There is reversal of the normal cervical lordosis with apex at C4-5. Vertebral body heights are preserved. Signal intensity within the vertebral body bone marrow and spinal cord is within normal limits.  At C2-3, there is broad-based degenerative disc osteophyte with bilateral facet arthrosis, right greater than left. There is resultant mild bilateral foraminal narrowing without central canal stenosis.  At C3-4, there is broad-based degenerative disc osteophyte with bilateral uncovertebral osteophytosis. Posterior disc osteophyte partially flattens and effaces the ventral thecal sac resulting in mild canal stenosis. Probable mild bilateral foraminal narrowing is present as well.  At C4-5, there is broad-based degenerative disc osteophyte with bilateral uncovertebral osteophytosis. On sagittal projection, there is fairly severe central canal stenosis with loss of ventral and dorsal CSF signal. No definite cord signal changes are identified, however, evaluation is limited on this examination. There is likely severe left with mild to moderate right foraminal stenosis.  At C5-6, there is broad-based degenerative disc osteophyte with bilateral uncovertebral osteophytosis. On sagittal projection, there is severe central canal stenosis with loss of ventral and dorsal CSF signal intensity. No definite associated cord signal changes identified, although evaluation is somewhat limited on this examination. At least  moderate bilateral foraminal stenosis is suspected, poorly evaluated due to motion artifact.  At C6-7, there is broad-based degenerative disc osteophyte with bilateral uncovertebral osteophytosis and facet arthrosis. On sagittal projection, there is fairly severe central canal stenosis with loss of dorsal and ventral CSF signal. No definite cord signal changes. Probable mild bilateral foraminal narrowing.  At C7-T1, there is broad-based degenerative disc osteophyte resulting and moderate central canal stenosis. No definite foraminal narrowing.  Visualized soft tissues of the neck are grossly within normal limits.  MRI THORACIC SPINE FINDINGS  There is mild dextroscoliosis of the thoracic spine. No listhesis. Normal thoracic kyphosis is preserved. Vertebral body heights are preserved. No acute fracture. Signal intensity within the vertebral body bone marrow is somewhat patchy and heterogeneous in appearance without discrete osseous lesion.  Prominent degenerative anterior osteophytes are seen anteriorly at T7-8, T8-9 T11-12, T12-L1, and L1-2.  At T2-3, there is a small right paracentral disc protrusion without significant canal or foraminal stenosis (series 1400, image 7).  At T3-4, there is mild diffuse disc bulge without significant canal or foraminal stenosis.  At T4-5, there is diffuse degenerative disc bulging without focal disc protrusion. No canal or foraminal stenosis.  At T5-6, there is diffuse degenerative disc bulge with a superimposed left paracentral disc protrusion which abuts the left ventral aspect of the thoracic spinal cord (series 14, image 16). There is resultant mild canal stenosis. No definite foraminal narrowing.  At T6-7, small left paracentral disc protrusion indents the left ventral thecal sac without significant canal or foraminal stenosis.  At T7-T8, there is degenerative disc bulge without significant canal or foraminal stenosis.  At T8-9, there is diffuse  disc bulge with superimposed  right paracentral disc osteophyte which indents the right ventral thecal sac and flattens the right ventral spinal cord (series 14, image 25). The central canal and neural foramina remain widely patent.  At T9-10, small central disc protrusion indents the ventral thecal sac without significant canal or foraminal stenosis.  At T10-11, there is mild degenerative disc bulge, eccentric to the left which flattens the left ventral thecal sac and results in mild canal stenosis. No significant foraminal narrowing appreciated.  At T11-T12 and T12-L1, no significant canal or foraminal stenosis.  Visualized paraspinous soft tissues are within normal limits. Visualized lungs are grossly clear.  IMPRESSION: MRI CERVICAL SPINE:  1. Multilevel degenerative disc disease with resultant severe central canal stenosis at C4 through C7 as above. No definite cord signal changes are identified at these levels, however, please note that evaluation is limited on this exam due to motion artifact and body habitus. 2. Additional multilevel degenerative disc disease as above. 3. Moderate atrophy within the partially visualized brain with probable small vessel ischemic changes within the pons, incompletely evaluated on this examination.  MRI THORACIC SPINE:  1. No significant central canal stenosis or cord signal changes within the thoracic spinal cord. 2. Multilevel degenerative disc disease with superimposed focal disc protrusions with resultant mild canal narrowing at several levels as detailed above.  Electronically Signed: By: Jeannine Boga M.D. On: 07/16/2013 00:37   Ct Cta Abd/pel W/cm &/or W/o Cm  07/15/2013   CLINICAL DATA:  Pain between shoulder blades extending to the mid back.  The patient developed itching after contrast injection suggesting and allergic reaction. The patient was seen by Dr. Jeannine Kitten. The symptoms resolved.  EXAM: CT ANGIOGRAPHY CHEST, ABDOMEN AND PELVIS  TECHNIQUE: Multidetector CT imaging through the chest,  abdomen and pelvis was performed using the standard protocol during bolus administration of intravenous contrast. Multiplanar reconstructed images and MIPs were obtained and reviewed to evaluate the vascular anatomy.  CONTRAST:  128mL OMNIPAQUE IOHEXOL 350 MG/ML SOLN  COMPARISON:  Two-view chest x-ray 07/15/2013.  FINDINGS: CTA CHEST FINDINGS  Coronary artery calcifications are evident on the precontrast study. The heart is mildly enlarged.  The postcontrast images demonstrating normal appearance of the aorta. The aortic root an arch are within normal limits. A standard 3 vessel arch configuration is present. There is mild tortuosity of the proximal right common carotid artery in the proximal left vertebral artery. No significant pulmonary filling defects are present.  No significant mediastinal or axillary adenopathy is present.  Diffuse ground-glass attenuation is present in the lungs bilaterally. Ill-defined airspace disease is present on the left. This likely reflects atelectasis. The ground-glass opacities represent either atelectasis or edema. A small left pleural effusion is present.  The bone windows demonstrate mild endplate changes. No focal lytic or blastic lesions are evident.  Review of the MIP images confirms the above findings.  CTA ABDOMEN AND PELVIS FINDINGS  Atherosclerotic calcifications are present at the celiac and superior mesenteric origins. There is no significant stenosis. The left renal artery scratch the bilateral renal artery calcifications are present without significant stenosis. The aortic bifurcation and branch vessels are within normal limits.  The liver and spleen are within normal limits. The stomach, duodenum, and pancreas are unremarkable. The common bile duct is within normal limits. The gallbladder is mostly collapsed. The adrenal glands and right kidney are normal bilaterally. A nonobstructing stone is present at the lower pole of the left kidney. The rectosigmoid colon is  within normal  limits. The remainder the colon is unremarkable. The appendix is not discretely visualized and may be surgically absent. The small bowel is unremarkable. The urinary bladder is within normal limits. Fat extends into the inguinal canal bilaterally without associated bowel.  The bone windows demonstrate degenerative change with vacuum phenomena in the disc spaces at L3-4, L4-5, and L5-S1. There is gas in the disc space at L1-2. Multilevel facet degenerative changes are present.  Review of the MIP images confirms the above findings.  IMPRESSION: 1. Atherosclerotic changes within the thoraco abdominal aorta without aneurysm. 2. No evidence for dissection or embolus. 3. Diffuse ground-glass attenuation throughout the lungs. This likely represents atelectasis or edema. 4. Ill-defined airspace disease in the left likely reflects atelectasis. 5. Small left pleural effusion. 6. Mild cardiomegaly. 7. Nonobstructing stone at the lower pole of the left kidney. 8. Fat extends into the inguinal canals bilaterally without associated bowel. 9. Multilevel degenerative changes in the thoracolumbar spine.   Electronically Signed   By: Lawrence Santiago M.D.   On: 07/15/2013 20:16    Assessment/Plan: Improving   LOS: 1 day  Continue support, will plan for PT/OT ?tomorrow. Will request Infectious Disease consult. (Gram Stain was done - sent for second opinion at off-site lab?- awaiting results. Culture pending. Spoke with Dr. Linus Salmons - appreciate I.D.'s assistance.   Verdis Prime 07/16/2013, 11:44 AM

## 2013-07-16 NOTE — H&P (Signed)
[facility]     Reason for Consult: quadriparesis    Referring Physician: ward  Active Problems:   Spinal epidural abscess   Acute respiratory failure   OSA on CPAP   . aspirin EC  81 mg Oral Daily  . calcium carbonate  2 tablet Oral Q breakfast  . cefTRIAXone (ROCEPHIN)  IV  2 g Intravenous Q12H  . cholecalciferol  2,000 Units Oral Daily  . cyanocobalamin  500 mcg Oral Daily  . dexamethasone  4 mg Intravenous 4 times per day   Or  . dexamethasone  4 mg Oral 4 times per day  . [START ON 07/17/2013] docusate sodium  100 mg Oral BID  . ferrous fumarate  1 tablet Oral Once per day on Mon Wed Fri  . fluticasone  2 spray Each Nare Daily  . furosemide  20 mg Oral Daily  . lisinopril  5 mg Oral Daily  . loratadine  10 mg Oral Daily  . [START ON 07/17/2013] mometasone-formoterol  2 puff Inhalation BID  . multivitamin with minerals  1 tablet Oral Daily  . [START ON 07/17/2013] oxybutynin  2.5 mg Oral BID  . propofol      . [START ON 07/17/2013] senna  1 tablet Oral BID  . [START ON 07/17/2013] traZODone  50 mg Oral QHS  . vancomycin  1,500 mg Intravenous Once  . [START ON 07/17/2013] vancomycin  1,500 mg Intravenous Q24H    Recommendations/ Assessment: Epidural abscess: Culture and sensitivity report awaited Surgical drainage and antibiotics since patient has neurological symptoms. COPD: Continue with routine management. Oxygen therapy Obesity: Nutritional plan/ life style modification   Antibiotics: Empirically: Vancomycin Ceftriaxone metronidazole  HPI: [name] is a morbidly obese  79 y.o. female with history of hypertension, COPD, obstructive sleep apnea who presents in the emergency Department with pain in between her shoulder blades that started on Monday, 5 days ago. The pain was sudden in onset, no precipitating factor, gradual in progress, severe in intensity and aching in nature. The pain was aggravated by neck movement. She does not  report any shortness of breath or compressive sensation in the centre of the chest or palpitataions. Sometime later, she felt tingling and electric shooting pains in her arms and legs as well. She felt that her arms and legs are very weak and she was unable to walk normally. She did have nausea and vomiting in the emergency department which she thinks is due to taking Vicodin. No diaphoresis. No history of injury to the back or increased exertion. No chest pain. No bowel or bladder incontinence or urinary retention/ fecal incontinece. She went to the [facility] emergency department where she was managed and scheduled for a CT at a much later date. During her course in the ER, the patient developed progressive paralysis of all four limbs (quadripegia) to the point that she was barely able to move her legs and cannot move her hands. She says that at its worst the pain between her shoulders has been 7/10. No history of any surgery or chronic steroid use/ impaired immunological status. No previous history of fractures/ fall. Troponin and lipase are normal as per 09/12/2013 report. Previously, she was not complained of a fever, but in the ER, her temperature has been 102.2 and her WBC is 14 K.    Review of Systems: Gastrointestinal: negative except for change in bowel habits, nausea and vomiting Musculoskeletal:negative except for muscle weakness and neck pain Neurological:  negative except for weakness and shooting pain in her upper and lower limb and numbness  Past Medical History  Diagnosis Date  . Hypertension   . Asthma   . Cancer     Skin Cancer  . Sleep apnea     History  Substance Use Topics  . Smoking status: Never Smoker   . Smokeless tobacco: Not on file  . Alcohol Use: No   Smoker for 20 years smoking 1 pack per day but gave up on it some years ago. History reviewed. No pertinent family history. Allergies  Allergen Reactions  . Codeine Nausea Only  . Iohexol Itching     07-15-13 pt developed itching on fingers after contrast given. Dr. Irish Elders looked at pt and said to put in system as allergy. BB  . Synvisc [Hylan G-F 20]   Family History: Mother had breast cancer. Died in her 55s. No history of DM, HTN in her family or other family disorders. Sister has breast cancer.  Surgical History: strabissimus when she was 79 years old Adenoidectomy Cholecystectomy: 40 years Gastric bypass and hiatal hernia repair Bilateral Knee replacement 2013, 2012 Bilateral carpal tunnel surgery Apeendectomy    OBJECTIVE: Blood pressure 176/88, pulse 88, temperature 99.6 F (37.6 C), temperature source Axillary, resp. rate 21, height 5\' 3"  (1.6 m), weight 119.5 kg (263 lb 7.2 oz), SpO2 98.00%. General: Morbidly obese lady lying comfortably in bed A+Ox3. NAD. Head: Normocephalic and atraumatic.  Eyes: EOM are normal. Pupils are equal, round, and reactive to light.  Neck: Spinous process tenderness present. Lymph nodes not palpable. Cardiovascular:RRR Respiratory: CTA B  GI: Soft. Normal appearance. There is no tenderness. No visceromegaly can be appreciated.  Neurological: She is alert and oriented to person, place, and time.  A sensory deficit slighlty is present. No cranial nerve deficit. She exhibits normal muscle tone. Coordination normal for upper limb can not be assessed for lower limb as power is decreased.. GCS eye subscore is 4. GCS verbal subscore is 5. GCS motor subscore is 6.  Motor examination reveals full bilateral Deltoid and biceps strength with Triceps 3/5 bilaterally. In the right hand she can not adduct or abduct her fingers fully. Finger flexion and extension impaired. No muscle wasting noted. Wrist movement normal. Her legs have rotation at the hips, and power is 2/5. Adduction and abduction are normal. Reflexes need to be assessed. Proprioception intact. Babinski down going Sensory sensations intact.  Pallor: absent Cyanosis: absent Jaundice:  absent Edema: absent  Microbiology: Recent Results (from the past 240 hour(s))  CULTURE, ROUTINE-ABSCESS     Status: None   Collection Time    07/16/13  4:15 AM      Result Value Ref Range Status   Specimen Description ABSCESS LEFT NECK   Final   Special Requests NONE   Final   Gram Stain     Final   Value: ABUNDANT WBC PRESENT,BOTH PMN AND MONONUCLEAR     NO SQUAMOUS EPITHELIAL CELLS SEEN     ABUNDANT GRAM POSITIVE COCCI     IN PAIRS     Performed at [facility]   Culture PENDING   Incomplete   Report Status PENDING   Incomplete  ANAEROBIC CULTURE     Status: None   Collection Time    07/16/13  4:15 AM      Result Value Ref Range Status   Specimen Description ABSCESS LEFT NECK   Final   Special Requests NONE   Final   Gram Stain  PENDING   Incomplete   Culture     Final   Value: NO ANAEROBES ISOLATED; CULTURE IN PROGRESS FOR 5 DAYS     Performed at [facility]   Report Status PENDING   Incomplete  ANAEROBIC CULTURE     Status: None   Collection Time    07/16/13  4:19 AM      Result Value Ref Range Status   Specimen Description ABSCESS LEFT NECK   Final   Special Requests NONE   Final   Gram Stain     Final   Value: ABUNDANT WBC PRESENT,BOTH PMN AND MONONUCLEAR     NO SQUAMOUS EPITHELIAL CELLS SEEN     ABUNDANT GRAM POSITIVE COCCI     IN PAIRS     Performed at [facility]   Culture     Final   Value: NO ANAEROBES ISOLATED; CULTURE IN PROGRESS FOR 5 DAYS     Performed at [facility]   Report Status PENDING   Incomplete  TISSUE CULTURE     Status: None   Collection Time    07/16/13  4:24 AM      Result Value Ref Range Status   Specimen Description TISSUE LEFT NECK   Final   Special Requests NO 3 IN CUP   Final   Gram Stain     Final   Value: RARE WBC PRESENT, PREDOMINANTLY PMN     NO ORGANISMS SEEN     Performed at [facility]   Culture PENDING   Incomplete   Report Status PENDING   Incomplete  MRSA PCR SCREENING      Status: None   Collection Time    07/16/13  5:33 AM      Result Value Ref Range Status   MRSA by PCR NEGATIVE  NEGATIVE Final   Comment:            The GeneXpert MRSA Assay (FDA     approved for NASAL specimens     only), is one component of a     comprehensive MRSA colonization     surveillance program. It is not     intended to diagnose MRSA     infection nor to guide or     monitor treatment for     MRSA infections.    Janalyn Shy, elective student [place] [facility] [email or web address] [phone] pager  [phone] cell 07/16/2013, 1:34 PM

## 2013-07-16 NOTE — Brief Op Note (Signed)
07/15/2013 - 07/16/2013  5:13 AM  PATIENT:  [name]  79 y.o. female  PRE-OPERATIVE DIAGNOSIS:  epidural abscess, quadriparesis   POST-OPERATIVE DIAGNOSIS:  epidural abscess, quadriparesis  PROCEDURE:  Procedure(s): ANTERIOR CERVICAL DECOMPRESSION/DISCECTOMY FUSION mutlipleLEVELS C4-7 (N/A) with allograft, plate C 45, C 56, C 67 levels with plating C 4-7 levels  SURGEON:  Surgeon(s) and Role:    * Erline Levine, MD - Primary  PHYSICIAN ASSISTANT:   ASSISTANTS: none   ANESTHESIA:   general  EBL:  Total I/O In: 1200 [I.V.:1200] Out: 485 [Urine:435; Blood:50]  BLOOD ADMINISTERED:none  DRAINS: (10) Jackson-Pratt drain(s) with closed bulb suction in the prevertebral space   LOCAL MEDICATIONS USED:  LIDOCAINE   SPECIMEN:  Source of Specimen:  cultures from prevertebral space and disc space  DISPOSITION OF SPECIMEN:  PATHOLOGY  COUNTS:  YES  TOURNIQUET:  * No tourniquets in log *  DICTATION: Patient was brought to operating room and following the smooth and uncomplicated induction of general endotracheal anesthesia his head was placed on a horseshoe head holder he was placed in 5 pounds of Holter traction and his anterior neck was prepped and draped in usual sterile fashion. An incision was made on the left side of midline after infiltrating the skin and subcutaneous tissues with local lidocaine. The platysmal layer was incised and subplatysmal dissection was performed exposing the anterior border sternocleidomastoid muscle. Using blunt dissection the carotid sheath was kept lateral and trachea and esophagus kept medial exposing the anterior cervical spine. The prevertebral tissues were thickened and edematous.  Purulent material was expressed at C 67 level and cultured. A bent spinal needle was placed it was felt to be the C34  level and this was confirmed on intraoperative x-ray. Longus coli muscles were taken down from the anterior cervical spine using electrocautery and key  elevator and self-retaining retractor was placed. The interspace at [facility] was incised and a thorough discectomy was performed. Distraction pins were placed. Uncinate spurs and central spondylitic ridges were drilled down with a high-speed drill. The spinal cord dura and both C6 nerve roots were widely decompressed. The disc appeared degenerated and infected with thickened granulation tissue compressing the spinal cord dura.  Hemostasis was assured. After trial sizing a 7 mm lordotic machined allograft bone wedge was selected and  tamped into position and countersunk appropriately. The retractor was moved and the interspace at C67 was incised and a thorough discectomy was performed. The interspace was clearly infected and frank pus was expressed and cultured. Distraction pins were placed. Uncinate spurs and central spondylitic ridges were drilled down with a high-speed drill. The spinal cord dura and both C7 nerve roots were widely decompressed. Hemostasis was assured. After trial sizing an 8 mm  lordotic machined allograft bone wedge was selected and  tamped into position and countersunk appropriately. The interspace at C4-5 was incised and a thorough discectomy was performed. Distraction pins were placed. Uncinate spurs and central spondylitic ridges were drilled down with a high-speed drill. The spinal cord dura and both C5 nerve roots were widely decompressed. A large osteophyte was removed on the right with decompression of the right C5 nerve root. Hemostasis was assured. After trial sizing a 6 mm  lordotic machined allograft bone wedge was selected and  tamped into position and countersunk appropriately.  This level did not appear to be as significantly infected.   Distraction weight was removed. A 58 mm Nuvasive anterior cervical plate was affixed to the cervical spine with 13 mm  variable-angle screws 2 at C4, 2 at C5, 2 at C6,  and 2 at C7. All screws were well-positioned and locking mechanisms were engaged.  Soft tissues were inspected and found to be in good repair. The wound was irrigated. A final x-ray was obtained with visualization at C3 only. A 10 JP drain was placed. The platysma layer was closed with 3-0 Vicryl stitches and the skin was reapproximated with 3-0 Vicryl subcuticular stitches. The wound was dressed with Dermabond. Counts were correct at the end of the case. Patient was taken to ICU still intubated in stable and satisfactory condition.    PLAN OF CARE: Admit to inpatient   PATIENT DISPOSITION:  PACU - hemodynamically stable.   Delay start of Pharmacological VTE agent (>24hrs) due to surgical blood loss or risk of bleeding: yes

## 2013-07-16 NOTE — Progress Notes (Signed)
ANTIBIOTIC CONSULT NOTE - INITIAL  Pharmacy Consult for Vancomycin  Indication: Epidural spinal abscess  Allergies  Allergen Reactions  . Codeine Nausea Only  . Iohexol Itching    07-15-13 pt developed itching on fingers after contrast given. Dr. Irish [name] looked at pt and said to put in system as allergy. BB  . Synvisc [Hylan G-F 20]     Patient Measurements: ~115 kg  Vital Signs: Temp: 98.4 F (36.9 C) (03/06 0006) Temp src: Oral (03/06 0006) BP: 124/64 mmHg (03/06 0145) Pulse Rate: 85 (03/06 0145) Intake/Output from previous day: 03/05 0701 - 03/06 0700 In: 2200 [I.V.:2200] Out: 485 [Urine:435; Blood:50] Intake/Output from this shift: Total I/O In: 2200 [I.V.:2200] Out: 485 [Urine:435; Blood:50]  Labs:  Recent Labs  07/15/13 1740 07/15/13 1904  WBC 14.6*  --   HGB 12.6 14.6  PLT PLATELET CLUMPS NOTED ON SMEAR, UNABLE TO ESTIMATE  --   CREATININE 0.87 1.10   Medical History: Past Medical History  Diagnosis Date  . Hypertension   . Asthma   . Cancer     Skin Cancer  . Sleep apnea    Assessment: [name] s/p surgery this AM with epidural spinal abscess. WBC 14.6, renal function ok, other labs as above.   Vancomycin 1500 mg IV x 1 given in OR at 0455  Goal of Therapy:  Vancomycin trough level 15-20 mcg/ml  Plan:  -Vancomycin 1500 mg IV q24h -Rocephin per MD -Trend WBC, temp, renal function  -Drug levels as indicated   [name] 07/16/2013,5:16 AM

## 2013-07-16 NOTE — Clinical Social Work Note (Signed)
Clinical Social Worker received referral for possible ST-SNF placement.  Chart reviewed.  PT/OT ordered for tomorrow due to patient extubation today.  CSW will follow up with patient and family regarding potential placement needs following PT/OT evaluations.  CSW available for support as needed.  Barbette Or, [place]

## 2013-07-16 NOTE — Consult Note (Signed)
Reason for Consult:Quadriparesis Referring Physician: Ward  Jerzey Komperda [name] is an 79 y.o. female.  CNO:[id number] is a 79 year old female with history of hypertension, COPD, obstructive sleep apnea, morbid obesity who presents emergency Department with pain in between her shoulder blades that she is unable to describe that started on Monday, 3 days ago. She denies any aggravating or relieving factors. She reports that when she moves she feels tingling and electric shooting pains in her arms and legs. She feels that her arms and legs are very weak she is unable to walk normally. She denies any shortness of breath but did have nausea and vomiting in the emergency department today which she thinks is due to taking Vicodin. No diaphoresis. No history of injury to the back or increased exertion. No chest pain. No bowel or bladder incontinence or urinary retention. She was seen at [facility] emergency department 2 days ago states she had a full workup including a CT scan. During her course in the ER, the patient developed progressive quadriparessi to the point that she is barely able to move her legs and cannot move her hands.  She says that at its worst the pain between her shoulders has been 50/10.  Previously, she was not complained of a fever, but in the ER, her temperature has been 102.2 and her WBC is 14 K.     Past Medical History  Diagnosis Date  . Hypertension   . Asthma   . Cancer     Skin Cancer  . Sleep apnea     Past Surgical History  Procedure Laterality Date  . Gastric bypass      History reviewed. No pertinent family history.  Social History:  reports that she has never smoked. She does not have any smokeless tobacco history on file. She reports that she does not drink alcohol. Her drug history is not on file.  Allergies:  Allergies  Allergen Reactions  . Codeine Nausea Only  . Iohexol Itching    07-15-13 pt developed itching on fingers after contrast given. Dr. Irish Elders  looked at pt and said to put in system as allergy. BB  . Synvisc [Hylan G-F 20]     Medications: I have reviewed the patient's current medications.  Results for orders placed during the hospital encounter of 07/15/13 (from the past 48 hour(s))  APTT     Status: Abnormal   Collection Time    07/15/13  2:50 PM      Result Value Ref Range   aPTT <20 (*) 24 - 37 seconds   Comment: SPECIMEN CHECKED FOR CLOTS  PROTIME-INR     Status: None   Collection Time    07/15/13  2:50 PM      Result Value Ref Range   Prothrombin Time 12.1  11.6 - 15.2 seconds   INR 0.91  0.00 - 1.49  CBC WITH DIFFERENTIAL     Status: Abnormal   Collection Time    07/15/13  5:40 PM      Result Value Ref Range   WBC 14.6 (*) 4.0 - 10.5 K/uL   RBC 3.95  3.87 - 5.11 MIL/uL   Hemoglobin 12.6  12.0 - 15.0 g/dL   HCT 37.7  36.0 - 46.0 %   MCV 95.4  78.0 - 100.0 fL   MCH 31.9  26.0 - 34.0 pg   MCHC 33.4  30.0 - 36.0 g/dL   RDW 14.1  11.5 - 15.5 %   Platelets PLATELET CLUMPS  NOTED ON SMEAR, UNABLE TO ESTIMATE  150 - 400 K/uL   Neutrophils Relative % 89 (*) 43 - 77 %   Lymphocytes Relative 5 (*) 12 - 46 %   Monocytes Relative 6  3 - 12 %   Eosinophils Relative 0  0 - 5 %   Basophils Relative 0  0 - 1 %   Neutro Abs 13.0 (*) 1.7 - 7.7 K/uL   Lymphs Abs 0.7  0.7 - 4.0 K/uL   Monocytes Absolute 0.9  0.1 - 1.0 K/uL   Eosinophils Absolute 0.0  0.0 - 0.7 K/uL   Basophils Absolute 0.0  0.0 - 0.1 K/uL   Smear Review MORPHOLOGY UNREMARKABLE    COMPREHENSIVE METABOLIC PANEL     Status: Abnormal   Collection Time    07/15/13  5:40 PM      Result Value Ref Range   Sodium 136 (*) 137 - 147 mEq/L   Potassium 4.2  3.7 - 5.3 mEq/L   Chloride 94 (*) 96 - 112 mEq/L   CO2 25  19 - 32 mEq/L   Glucose, Bld 114 (*) 70 - 99 mg/dL   BUN 28 (*) 6 - 23 mg/dL   Creatinine, Ser 0.87  0.50 - 1.10 mg/dL   Calcium 9.8  8.4 - 10.5 mg/dL   Total Protein 8.1  6.0 - 8.3 g/dL   Albumin 3.4 (*) 3.5 - 5.2 g/dL   AST 27  0 - 37 U/L   ALT 18   0 - 35 U/L   Alkaline Phosphatase 79  39 - 117 U/L   Total Bilirubin 0.5  0.3 - 1.2 mg/dL   GFR calc non Af Amer 63 (*) >90 mL/min   GFR calc Af Amer 73 (*) >90 mL/min   Comment: (NOTE)     The eGFR has been calculated using the CKD EPI equation.     This calculation has not been validated in all clinical situations.     eGFR's persistently <90 mL/min signify possible Chronic Kidney     Disease.  LIPASE, BLOOD     Status: None   Collection Time    07/15/13  5:40 PM      Result Value Ref Range   Lipase 19  11 - 59 U/L  TROPONIN I     Status: None   Collection Time    07/15/13  5:50 PM      Result Value Ref Range   Troponin I <0.30  <0.30 ng/mL   Comment:            Due to the release kinetics of cTnI,     a negative result within the first hours     of the onset of symptoms does not rule out     myocardial infarction with certainty.     If myocardial infarction is still suspected,     repeat the test at appropriate intervals.  SAMPLE TO BLOOD BANK     Status: None   Collection Time    07/15/13  6:46 PM      Result Value Ref Range   Blood Bank Specimen SAMPLE AVAILABLE FOR TESTING     Sample Expiration 07/16/2013    I-STAT CHEM 8, ED     Status: Abnormal   Collection Time    07/15/13  7:04 PM      Result Value Ref Range   Sodium 136 (*) 137 - 147 mEq/L   Potassium 4.1  3.7 - 5.3 mEq/L  Chloride 98  96 - 112 mEq/L   BUN 33 (*) 6 - 23 mg/dL   Creatinine, Ser 1.10  0.50 - 1.10 mg/dL   Glucose, Bld 121 (*) 70 - 99 mg/dL   Calcium, Ion 1.11 (*) 1.13 - 1.30 mmol/L   TCO2 30  0 - 100 mmol/L   Hemoglobin 14.6  12.0 - 15.0 g/dL   HCT 43.0  36.0 - 46.0 %  URINALYSIS, ROUTINE W REFLEX MICROSCOPIC     Status: Abnormal   Collection Time    07/15/13 10:20 PM      Result Value Ref Range   Color, Urine AMBER (*) YELLOW   Comment: BIOCHEMICALS MAY BE AFFECTED BY COLOR   APPearance CLEAR  CLEAR   Specific Gravity, Urine 1.015  1.005 - 1.030   pH 6.0  5.0 - 8.0   Glucose, UA  NEGATIVE  NEGATIVE mg/dL   Hgb urine dipstick LARGE (*) NEGATIVE   Bilirubin Urine SMALL (*) NEGATIVE   Ketones, ur NEGATIVE  NEGATIVE mg/dL   Protein, ur 100 (*) NEGATIVE mg/dL   Urobilinogen, UA 1.0  0.0 - 1.0 mg/dL   Nitrite NEGATIVE  NEGATIVE   Leukocytes, UA NEGATIVE  NEGATIVE  URINE MICROSCOPIC-ADD ON     Status: Abnormal   Collection Time    07/15/13 10:20 PM      Result Value Ref Range   Squamous Epithelial / LPF FEW (*) RARE   WBC, UA 3-6  <3 WBC/hpf   RBC / HPF 7-10  <3 RBC/hpf   Casts HYALINE CASTS (*) NEGATIVE   Urine-Other RARE YEAST      Dg Chest 2 View  07/15/2013   CLINICAL DATA:  History of asthma and hypertension now with pain. Between the shoulder blades  EXAM: CHEST  2 VIEW  COMPARISON:  None.  FINDINGS: The lungs are reasonably well inflated. The interstitial markings are increased bilaterally. The pulmonary vascularity is mildly prominent. The cardiopericardial silhouette is enlarged. There is tortuosity of the ascending and descending thoracic aorta. Linear density lateral to the cardiac apex may reflect atelectasis or scarring. There is no pleural effusion. The thoracic vertebral bodies are preserved in height.  IMPRESSION: 1. Increased interstitial markings bilaterally may reflect low-grade interstitial edema secondary to CHF. 2. Prominence of the ascending and descending thoracic aorta is normal. There are no previous studies with which to compare. Further evaluation of the chest with CT scanning would be of value.   Electronically Signed   By: David  Martinique   On: 07/15/2013 17:48   Ct Angio Chest W/cm &/or Wo Cm  07/15/2013   CLINICAL DATA:  Pain between shoulder blades extending to the mid back.  The patient developed itching after contrast injection suggesting and allergic reaction. The patient was seen by Dr. Jeannine Kitten. The symptoms resolved.  EXAM: CT ANGIOGRAPHY CHEST, ABDOMEN AND PELVIS  TECHNIQUE: Multidetector CT imaging through the chest, abdomen and pelvis was  performed using the standard protocol during bolus administration of intravenous contrast. Multiplanar reconstructed images and MIPs were obtained and reviewed to evaluate the vascular anatomy.  CONTRAST:  187m OMNIPAQUE IOHEXOL 350 MG/ML SOLN  COMPARISON:  Two-view chest x-ray 07/15/2013.  FINDINGS: CTA CHEST FINDINGS  Coronary artery calcifications are evident on the precontrast study. The heart is mildly enlarged.  The postcontrast images demonstrating normal appearance of the aorta. The aortic root an arch are within normal limits. A standard 3 vessel arch configuration is present. There is mild tortuosity of the proximal right common carotid  artery in the proximal left vertebral artery. No significant pulmonary filling defects are present.  No significant mediastinal or axillary adenopathy is present.  Diffuse ground-glass attenuation is present in the lungs bilaterally. Ill-defined airspace disease is present on the left. This likely reflects atelectasis. The ground-glass opacities represent either atelectasis or edema. A small left pleural effusion is present.  The bone windows demonstrate mild endplate changes. No focal lytic or blastic lesions are evident.  Review of the MIP images confirms the above findings.  CTA ABDOMEN AND PELVIS FINDINGS  Atherosclerotic calcifications are present at the celiac and superior mesenteric origins. There is no significant stenosis. The left renal artery scratch the bilateral renal artery calcifications are present without significant stenosis. The aortic bifurcation and branch vessels are within normal limits.  The liver and spleen are within normal limits. The stomach, duodenum, and pancreas are unremarkable. The common bile duct is within normal limits. The gallbladder is mostly collapsed. The adrenal glands and right kidney are normal bilaterally. A nonobstructing stone is present at the lower pole of the left kidney. The rectosigmoid colon is within normal limits. The  remainder the colon is unremarkable. The appendix is not discretely visualized and may be surgically absent. The small bowel is unremarkable. The urinary bladder is within normal limits. Fat extends into the inguinal canal bilaterally without associated bowel.  The bone windows demonstrate degenerative change with vacuum phenomena in the disc spaces at L3-4, L4-5, and L5-S1. There is gas in the disc space at L1-2. Multilevel facet degenerative changes are present.  Review of the MIP images confirms the above findings.  IMPRESSION: 1. Atherosclerotic changes within the thoraco abdominal aorta without aneurysm. 2. No evidence for dissection or embolus. 3. Diffuse ground-glass attenuation throughout the lungs. This likely represents atelectasis or edema. 4. Ill-defined airspace disease in the left likely reflects atelectasis. 5. Small left pleural effusion. 6. Mild cardiomegaly. 7. Nonobstructing stone at the lower pole of the left kidney. 8. Fat extends into the inguinal canals bilaterally without associated bowel. 9. Multilevel degenerative changes in the thoracolumbar spine.   Electronically Signed   By: Lawrence Santiago M.D.   On: 07/15/2013 20:16   Mr Cervical Spine Wo Contrast  07/16/2013   CLINICAL DATA:  Pain between shoulder blades radiating into the neck and down back with lower extremity weakness.  EXAM: MRI CERVICAL AND THORACIC SPINE WITHOUT CONTRAST  TECHNIQUE: Multiplanar and multiecho pulse sequences of the cervical spine, to include the craniocervical junction and cervicothoracic junction, and thoracic spine, were obtained without intravenous contrast.  COMPARISON:  None available  FINDINGS: MRI CERVICAL SPINE FINDINGS  Study is degraded by motion artifact and body habitus.  Cerebral atrophy noted within the partially visualized brain. Hyperintense T2 signal intensity within the pons is noted, which may be related to underlying chronic small vessel disease. Visualized brain is otherwise unremarkable.  Craniocervical junction is patent.  There is reversal of the normal cervical lordosis with apex at C4-5. Vertebral body heights are preserved. Signal intensity within the vertebral body bone marrow and spinal cord is within normal limits.  At C2-3, there is broad-based degenerative disc osteophyte with bilateral facet arthrosis, right greater than left. There is resultant mild bilateral foraminal narrowing without central canal stenosis.  At C3-4, there is broad-based degenerative disc osteophyte with bilateral uncovertebral osteophytosis. Posterior disc osteophyte partially flattens and effaces the ventral thecal sac resulting in mild canal stenosis. Probable mild bilateral foraminal narrowing is present as well.  At C4-5, there is  broad-based degenerative disc osteophyte with bilateral uncovertebral osteophytosis. On sagittal projection, there is fairly severe central canal stenosis with loss of ventral and dorsal CSF signal. No definite cord signal changes are identified, however, evaluation is limited on this examination. There is likely severe left with mild to moderate right foraminal stenosis.  At C5-6, there is broad-based degenerative disc osteophyte with bilateral uncovertebral osteophytosis. On sagittal projection, there is severe central canal stenosis with loss of ventral and dorsal CSF signal intensity. No definite associated cord signal changes identified, although evaluation is somewhat limited on this examination. At least moderate bilateral foraminal stenosis is suspected, poorly evaluated due to motion artifact.  At C6-7, there is broad-based degenerative disc osteophyte with bilateral uncovertebral osteophytosis and facet arthrosis. On sagittal projection, there is fairly severe central canal stenosis with loss of dorsal and ventral CSF signal. No definite cord signal changes. Probable mild bilateral foraminal narrowing.  At C7-T1, there is broad-based degenerative disc osteophyte resulting and  moderate central canal stenosis. No definite foraminal narrowing.  Visualized soft tissues of the neck are grossly within normal limits.  MRI THORACIC SPINE FINDINGS  There is mild dextroscoliosis of the thoracic spine. No listhesis. Normal thoracic kyphosis is preserved. Vertebral body heights are preserved. No acute fracture. Signal intensity within the vertebral body bone marrow is somewhat patchy and heterogeneous in appearance without discrete osseous lesion.  Prominent degenerative anterior osteophytes are seen anteriorly at T7-8, T8-9 T11-12, T12-L1, and L1-2.  At T2-3, there is a small right paracentral disc protrusion without significant canal or foraminal stenosis (series 1400, image 7).  At T3-4, there is mild diffuse disc bulge without significant canal or foraminal stenosis.  At T4-5, there is diffuse degenerative disc bulging without focal disc protrusion. No canal or foraminal stenosis.  At T5-6, there is diffuse degenerative disc bulge with a superimposed left paracentral disc protrusion which abuts the left ventral aspect of the thoracic spinal cord (series 14, image 16). There is resultant mild canal stenosis. No definite foraminal narrowing.  At T6-7, small left paracentral disc protrusion indents the left ventral thecal sac without significant canal or foraminal stenosis.  At T7-T8, there is degenerative disc bulge without significant canal or foraminal stenosis.  At T8-9, there is diffuse disc bulge with superimposed right paracentral disc osteophyte which indents the right ventral thecal sac and flattens the right ventral spinal cord (series 14, image 25). The central canal and neural foramina remain widely patent.  At T9-10, small central disc protrusion indents the ventral thecal sac without significant canal or foraminal stenosis.  At T10-11, there is mild degenerative disc bulge, eccentric to the left which flattens the left ventral thecal sac and results in mild canal stenosis. No  significant foraminal narrowing appreciated.  At T11-T12 and T12-L1, no significant canal or foraminal stenosis.  Visualized paraspinous soft tissues are within normal limits. Visualized lungs are grossly clear.  IMPRESSION: MRI CERVICAL SPINE:  1. Multilevel degenerative disc disease with resultant severe central canal stenosis at C4 through C7 as above. No definite cord signal changes are identified at these levels, however, please note that evaluation is limited on this exam due to motion artifact and body habitus. 2. Additional multilevel degenerative disc disease as above. 3. Moderate atrophy within the partially visualized brain with probable small vessel ischemic changes within the pons, incompletely evaluated on this examination.  MRI THORACIC SPINE:  1. No significant central canal stenosis or cord signal changes within the thoracic spinal cord. 2. Multilevel degenerative disc  disease with superimposed focal disc protrusions with resultant mild canal narrowing at several levels as detailed above.   Electronically Signed   By: Jeannine Boga M.D.   On: 07/16/2013 00:37   Mr Thoracic Spine Wo Contrast  07/16/2013   CLINICAL DATA:  Pain between shoulder blades radiating into the neck and down back with lower extremity weakness.  EXAM: MRI CERVICAL AND THORACIC SPINE WITHOUT CONTRAST  TECHNIQUE: Multiplanar and multiecho pulse sequences of the cervical spine, to include the craniocervical junction and cervicothoracic junction, and thoracic spine, were obtained without intravenous contrast.  COMPARISON:  None available  FINDINGS: MRI CERVICAL SPINE FINDINGS  Study is degraded by motion artifact and body habitus.  Cerebral atrophy noted within the partially visualized brain. Hyperintense T2 signal intensity within the pons is noted, which may be related to underlying chronic small vessel disease. Visualized brain is otherwise unremarkable. Craniocervical junction is patent.  There is reversal of the  normal cervical lordosis with apex at C4-5. Vertebral body heights are preserved. Signal intensity within the vertebral body bone marrow and spinal cord is within normal limits.  At C2-3, there is broad-based degenerative disc osteophyte with bilateral facet arthrosis, right greater than left. There is resultant mild bilateral foraminal narrowing without central canal stenosis.  At C3-4, there is broad-based degenerative disc osteophyte with bilateral uncovertebral osteophytosis. Posterior disc osteophyte partially flattens and effaces the ventral thecal sac resulting in mild canal stenosis. Probable mild bilateral foraminal narrowing is present as well.  At C4-5, there is broad-based degenerative disc osteophyte with bilateral uncovertebral osteophytosis. On sagittal projection, there is fairly severe central canal stenosis with loss of ventral and dorsal CSF signal. No definite cord signal changes are identified, however, evaluation is limited on this examination. There is likely severe left with mild to moderate right foraminal stenosis.  At C5-6, there is broad-based degenerative disc osteophyte with bilateral uncovertebral osteophytosis. On sagittal projection, there is severe central canal stenosis with loss of ventral and dorsal CSF signal intensity. No definite associated cord signal changes identified, although evaluation is somewhat limited on this examination. At least moderate bilateral foraminal stenosis is suspected, poorly evaluated due to motion artifact.  At C6-7, there is broad-based degenerative disc osteophyte with bilateral uncovertebral osteophytosis and facet arthrosis. On sagittal projection, there is fairly severe central canal stenosis with loss of dorsal and ventral CSF signal. No definite cord signal changes. Probable mild bilateral foraminal narrowing.  At C7-T1, there is broad-based degenerative disc osteophyte resulting and moderate central canal stenosis. No definite foraminal  narrowing.  Visualized soft tissues of the neck are grossly within normal limits.  MRI THORACIC SPINE FINDINGS  There is mild dextroscoliosis of the thoracic spine. No listhesis. Normal thoracic kyphosis is preserved. Vertebral body heights are preserved. No acute fracture. Signal intensity within the vertebral body bone marrow is somewhat patchy and heterogeneous in appearance without discrete osseous lesion.  Prominent degenerative anterior osteophytes are seen anteriorly at T7-8, T8-9 T11-12, T12-L1, and L1-2.  At T2-3, there is a small right paracentral disc protrusion without significant canal or foraminal stenosis (series 1400, image 7).  At T3-4, there is mild diffuse disc bulge without significant canal or foraminal stenosis.  At T4-5, there is diffuse degenerative disc bulging without focal disc protrusion. No canal or foraminal stenosis.  At T5-6, there is diffuse degenerative disc bulge with a superimposed left paracentral disc protrusion which abuts the left ventral aspect of the thoracic spinal cord (series 14, image 16). There is resultant  mild canal stenosis. No definite foraminal narrowing.  At T6-7, small left paracentral disc protrusion indents the left ventral thecal sac without significant canal or foraminal stenosis.  At T7-T8, there is degenerative disc bulge without significant canal or foraminal stenosis.  At T8-9, there is diffuse disc bulge with superimposed right paracentral disc osteophyte which indents the right ventral thecal sac and flattens the right ventral spinal cord (series 14, image 25). The central canal and neural foramina remain widely patent.  At T9-10, small central disc protrusion indents the ventral thecal sac without significant canal or foraminal stenosis.  At T10-11, there is mild degenerative disc bulge, eccentric to the left which flattens the left ventral thecal sac and results in mild canal stenosis. No significant foraminal narrowing appreciated.  At T11-T12 and  T12-L1, no significant canal or foraminal stenosis.  Visualized paraspinous soft tissues are within normal limits. Visualized lungs are grossly clear.  IMPRESSION: MRI CERVICAL SPINE:  1. Multilevel degenerative disc disease with resultant severe central canal stenosis at C4 through C7 as above. No definite cord signal changes are identified at these levels, however, please note that evaluation is limited on this exam due to motion artifact and body habitus. 2. Additional multilevel degenerative disc disease as above. 3. Moderate atrophy within the partially visualized brain with probable small vessel ischemic changes within the pons, incompletely evaluated on this examination.  MRI THORACIC SPINE:  1. No significant central canal stenosis or cord signal changes within the thoracic spinal cord. 2. Multilevel degenerative disc disease with superimposed focal disc protrusions with resultant mild canal narrowing at several levels as detailed above.   Electronically Signed   By: Jeannine Boga M.D.   On: 07/16/2013 00:37   Ct Cta Abd/pel W/cm &/or W/o Cm  07/15/2013   CLINICAL DATA:  Pain between shoulder blades extending to the mid back.  The patient developed itching after contrast injection suggesting and allergic reaction. The patient was seen by Dr. Jeannine Kitten. The symptoms resolved.  EXAM: CT ANGIOGRAPHY CHEST, ABDOMEN AND PELVIS  TECHNIQUE: Multidetector CT imaging through the chest, abdomen and pelvis was performed using the standard protocol during bolus administration of intravenous contrast. Multiplanar reconstructed images and MIPs were obtained and reviewed to evaluate the vascular anatomy.  CONTRAST:  18m OMNIPAQUE IOHEXOL 350 MG/ML SOLN  COMPARISON:  Two-view chest x-ray 07/15/2013.  FINDINGS: CTA CHEST FINDINGS  Coronary artery calcifications are evident on the precontrast study. The heart is mildly enlarged.  The postcontrast images demonstrating normal appearance of the aorta. The aortic root an  arch are within normal limits. A standard 3 vessel arch configuration is present. There is mild tortuosity of the proximal right common carotid artery in the proximal left vertebral artery. No significant pulmonary filling defects are present.  No significant mediastinal or axillary adenopathy is present.  Diffuse ground-glass attenuation is present in the lungs bilaterally. Ill-defined airspace disease is present on the left. This likely reflects atelectasis. The ground-glass opacities represent either atelectasis or edema. A small left pleural effusion is present.  The bone windows demonstrate mild endplate changes. No focal lytic or blastic lesions are evident.  Review of the MIP images confirms the above findings.  CTA ABDOMEN AND PELVIS FINDINGS  Atherosclerotic calcifications are present at the celiac and superior mesenteric origins. There is no significant stenosis. The left renal artery scratch the bilateral renal artery calcifications are present without significant stenosis. The aortic bifurcation and branch vessels are within normal limits.  The liver and spleen are  within normal limits. The stomach, duodenum, and pancreas are unremarkable. The common bile duct is within normal limits. The gallbladder is mostly collapsed. The adrenal glands and right kidney are normal bilaterally. A nonobstructing stone is present at the lower pole of the left kidney. The rectosigmoid colon is within normal limits. The remainder the colon is unremarkable. The appendix is not discretely visualized and may be surgically absent. The small bowel is unremarkable. The urinary bladder is within normal limits. Fat extends into the inguinal canal bilaterally without associated bowel.  The bone windows demonstrate degenerative change with vacuum phenomena in the disc spaces at L3-4, L4-5, and L5-S1. There is gas in the disc space at L1-2. Multilevel facet degenerative changes are present.  Review of the MIP images confirms the  above findings.  IMPRESSION: 1. Atherosclerotic changes within the thoraco abdominal aorta without aneurysm. 2. No evidence for dissection or embolus. 3. Diffuse ground-glass attenuation throughout the lungs. This likely represents atelectasis or edema. 4. Ill-defined airspace disease in the left likely reflects atelectasis. 5. Small left pleural effusion. 6. Mild cardiomegaly. 7. Nonobstructing stone at the lower pole of the left kidney. 8. Fat extends into the inguinal canals bilaterally without associated bowel. 9. Multilevel degenerative changes in the thoracolumbar spine.   Electronically Signed   By: Lawrence Santiago M.D.   On: 07/15/2013 20:16    Review of Systems - Negative except arthritis in knees with bilateral knee replacements and prior Gastric bypass    Blood pressure 142/65, pulse 89, temperature 98.4 F (36.9 C), temperature source Oral, resp. rate 19, SpO2 94.00%. Physical Exam  Constitutional: She is oriented to person, place, and time. She appears well-developed and well-nourished.  HENT:  Head: Normocephalic and atraumatic.  Eyes: EOM are normal. Pupils are equal, round, and reactive to light.  Neck: Spinous process tenderness and muscular tenderness present.  Cardiovascular: Normal rate and regular rhythm.   Respiratory: Effort normal and breath sounds normal.  GI: Soft. Normal appearance. There is no tenderness.  Neurological: She is alert and oriented to person, place, and time. She displays normal reflexes. A sensory deficit is present. No cranial nerve deficit. She exhibits normal muscle tone. Coordination and gait normal. GCS eye subscore is 4. GCS verbal subscore is 5. GCS motor subscore is 6.  Motor examination reveals full bilateral Deltoid and biceps strength with Triceps 1/5 bilaterally and no functional movement in hands.  Her legs have rotation at the hips, but no antigravity strength.  She notes numbness in both hands and legs.  Absent rectal  tone.  Assessment/Plan: Patient has rapidly progressive deterioration of neurologic exam with quadriparesis.  I reviewed her preoperative cervical MRI with Dr. Jeannine Kitten, which is of poor technical quality due to motion artifact, and there appears to abnormal tissue in the cervical prevertebral space and cord compression most severe at C 56 and also at C 45 and C 67 levels, mostly ventral to cord.  There is increased signal in cervical cord on STIR images.  There does not appear to be significant abnormality in the thoracic spine other than arthritis.  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  While we could obtain better imaging and contrasted images, I believe time is of the essence and have therefore recommended to patient that we proceed to surgery with the goal of decompressing her cervical spinal cord.  We will therefore proceed with ACDF  C 45, C 56, C 67 levels.  Patient and family are aware of risks and concerns that she may not improve, particularly if she is having a cord infarct.  Nonetheless, they understand the severity of the problem and wish to proceed.  Peggyann Shoals, MD 07/16/2013, 12:48 AM

## 2013-07-16 NOTE — Procedures (Signed)
Extubation Procedure Note  Patient Details:   Name: SHERLON NIED DOB: 23-Jun-1934 MRN: [id number]   Airway Documentation:     Evaluation  O2 sats: stable throughout Complications: No apparent complications Patient did tolerate procedure well. Bilateral Breath Sounds: Clear;Diminished Suctioning: Airway Yes  Order to extubate per MD. Pt positive for cuff leak, extubated to 3lpm [place]. No stridor or dyspnea noted after extubation. All vitals are within normal limits at this time. RT will continue to monitor.   Mariam Dollar 07/16/2013, 10:57 AM

## 2013-07-16 NOTE — H&P (Signed)
Please see my full consult note for details.

## 2013-07-16 NOTE — Transfer of Care (Signed)
Immediate Anesthesia Transfer of Care Note  Patient: [name]  Procedure(s) Performed: Procedure(s): ANTERIOR CERVICAL DECOMPRESSION/DISCECTOMY FUSION mutlipleLEVELS C4-7 (N/A)  Patient Location: NICU  Anesthesia Type:General  Level of Consciousness: sedated and Patient remains intubated per anesthesia plan  Airway & Oxygen Therapy: Patient remains intubated per anesthesia plan and Patient placed on Ventilator (see vital sign flow sheet for setting)  Post-op Assessment: Post -op Vital signs reviewed and stable  Post vital signs: Reviewed and stable  Complications: No apparent anesthesia complications

## 2013-07-16 NOTE — Op Note (Signed)
07/15/2013 - 07/16/2013  5:13 AM  PATIENT:  [name]  79 y.o. female  PRE-OPERATIVE DIAGNOSIS:  epidural abscess, quadriparesis   POST-OPERATIVE DIAGNOSIS:  epidural abscess, quadriparesis  PROCEDURE:  Procedure(s): ANTERIOR CERVICAL DECOMPRESSION/DISCECTOMY FUSION mutlipleLEVELS C4-7 (N/A) with allograft, plate C 45, C 56, C 67 levels with plating C 4-7 levels  SURGEON:  Surgeon(s) and Role:    * Erline Levine, MD - Primary  PHYSICIAN ASSISTANT:   ASSISTANTS: none   ANESTHESIA:   general  EBL:  Total I/O In: 1200 [I.V.:1200] Out: 485 [Urine:435; Blood:50]  BLOOD ADMINISTERED:none  DRAINS: (10) Jackson-Pratt drain(s) with closed bulb suction in the prevertebral space   LOCAL MEDICATIONS USED:  LIDOCAINE   SPECIMEN:  Source of Specimen:  cultures from prevertebral space and disc space  DISPOSITION OF SPECIMEN:  PATHOLOGY  COUNTS:  YES  TOURNIQUET:  * No tourniquets in log *  DICTATION: Patient was brought to operating room and following the smooth and uncomplicated induction of general endotracheal anesthesia his head was placed on a horseshoe head holder he was placed in 5 pounds of Holter traction and his anterior neck was prepped and draped in usual sterile fashion. An incision was made on the left side of midline after infiltrating the skin and subcutaneous tissues with local lidocaine. The platysmal layer was incised and subplatysmal dissection was performed exposing the anterior border sternocleidomastoid muscle. Using blunt dissection the carotid sheath was kept lateral and trachea and esophagus kept medial exposing the anterior cervical spine. The prevertebral tissues were thickened and edematous.  Purulent material was expressed at C 67 level and cultured. A bent spinal needle was placed it was felt to be the C34  level and this was confirmed on intraoperative x-ray. Longus coli muscles were taken down from the anterior cervical spine using electrocautery and key  elevator and self-retaining retractor was placed. The interspace at [facility] was incised and a thorough discectomy was performed. Distraction pins were placed. Uncinate spurs and central spondylitic ridges were drilled down with a high-speed drill. The spinal cord dura and both C6 nerve roots were widely decompressed. The disc appeared degenerated and infected with thickened granulation tissue compressing the spinal cord dura.  Hemostasis was assured. After trial sizing a 7 mm lordotic machined allograft bone wedge was selected and  tamped into position and countersunk appropriately. The retractor was moved and the interspace at C67 was incised and a thorough discectomy was performed. The interspace was clearly infected and frank pus was expressed and cultured. Distraction pins were placed. Uncinate spurs and central spondylitic ridges were drilled down with a high-speed drill. The spinal cord dura and both C7 nerve roots were widely decompressed. Hemostasis was assured. After trial sizing an 8 mm  lordotic machined allograft bone wedge was selected and  tamped into position and countersunk appropriately. The interspace at C4-5 was incised and a thorough discectomy was performed. Distraction pins were placed. Uncinate spurs and central spondylitic ridges were drilled down with a high-speed drill. The spinal cord dura and both C5 nerve roots were widely decompressed. A large osteophyte was removed on the right with decompression of the right C5 nerve root. Hemostasis was assured. After trial sizing a 6 mm  lordotic machined allograft bone wedge was selected and  tamped into position and countersunk appropriately.  This level did not appear to be as significantly infected.   Distraction weight was removed. A 58 mm Nuvasive anterior cervical plate was affixed to the cervical spine with 13 mm  variable-angle screws 2 at C4, 2 at C5, 2 at C6,  and 2 at C7. All screws were well-positioned and locking mechanisms were engaged.  Soft tissues were inspected and found to be in good repair. The wound was irrigated. A final x-ray was obtained with visualization at C3 only. A 10 JP drain was placed. The platysma layer was closed with 3-0 Vicryl stitches and the skin was reapproximated with 3-0 Vicryl subcuticular stitches. The wound was dressed with Dermabond. Counts were correct at the end of the case. Patient was taken to ICU still intubated in stable and satisfactory condition.    PLAN OF CARE: Admit to inpatient   PATIENT DISPOSITION:  PACU - hemodynamically stable.   Delay start of Pharmacological VTE agent (>24hrs) due to surgical blood loss or risk of bleeding: yes  

## 2013-07-16 NOTE — Anesthesia Postprocedure Evaluation (Signed)
  Anesthesia Post-op Note  Patient: [name]  Procedure(s) Performed: Procedure(s): ANTERIOR CERVICAL DECOMPRESSION/DISCECTOMY FUSION mutlipleLEVELS C4-7 (N/A)  Patient Location: ICU  Anesthesia Type:General  Level of Consciousness: sedated  Airway and Oxygen Therapy: Patient remains intubated per anesthesia plan  Post-op Pain: none  Post-op Assessment: Post-op Vital signs reviewed, Patient's Cardiovascular Status Stable and Respiratory Function Stable  Post-op Vital Signs: Reviewed and stable  Complications: No apparent anesthesia complications

## 2013-07-16 NOTE — Progress Notes (Signed)
eLink Physician-Brief Progress Note Patient Name: [name] DOB: 01/24/1935 MRN: [id number]  Date of Service  07/16/2013   HPI/Events of Note   Called by RN to extubate. agitation  eICU Interventions  Will add propofol, have bedside MD assess at bedside Ad propofol   Intervention Category Major Interventions: Airway management  [name]. 07/16/2013, 6:56 AM

## 2013-07-16 NOTE — ED Notes (Signed)
Report given to Monica, RN.

## 2013-07-16 NOTE — Anesthesia Preprocedure Evaluation (Addendum)
Anesthesia Evaluation  Patient identified by MRN, date of birth, ID band Patient awake    Reviewed: Allergy & Precautions, H&P , NPO status , Patient's Chart, lab work & pertinent test results  Airway Mallampati: II  Neck ROM: limited    Dental   Pulmonary asthma , sleep apnea and Continuous Positive Airway Pressure Ventilation , COPD         Cardiovascular hypertension,     Neuro/Psych Pt has progressively become weaker in all four extremities (legs > arms) over last 12 hours.    GI/Hepatic   Endo/Other  Morbid obesity  Renal/GU      Musculoskeletal   Abdominal   Peds  Hematology   Anesthesia Other Findings   Reproductive/Obstetrics                          Anesthesia Physical Anesthesia Plan  ASA: III and emergent  Anesthesia Plan: General   Post-op Pain Management:    Induction: Intravenous  Airway Management Planned: Oral ETT  Additional Equipment: Arterial line  Intra-op Plan:   Post-operative Plan: Post-operative intubation/ventilation  Informed Consent: I have reviewed the patients History and Physical, chart, labs and discussed the procedure including the risks, benefits and alternatives for the proposed anesthesia with the patient or authorized representative who has indicated his/her understanding and acceptance.     Plan Discussed with: CRNA, Anesthesiologist and Surgeon  Anesthesia Plan Comments:         Anesthesia Quick Evaluation

## 2013-07-16 NOTE — Anesthesia Procedure Notes (Signed)
Procedure Name: Intubation Date/Time: 07/16/2013 3:00 AM Performed by: Hollie Salk Z Pre-anesthesia Checklist: Patient identified, Timeout performed, Emergency Drugs available, Suction available and Patient being monitored Patient Re-evaluated:Patient Re-evaluated prior to inductionOxygen Delivery Method: Circle system utilized Preoxygenation: Pre-oxygenation with 100% oxygen Intubation Type: IV induction, Rapid sequence and Cricoid Pressure applied Grade View: Grade II Tube type: Oral Tube size: 7.5 mm Number of attempts: 1 Airway Equipment and Method: Stylet and Video-laryngoscopy Placement Confirmation: ETT inserted through vocal cords under direct vision,  breath sounds checked- equal and bilateral and positive ETCO2 Secured at: 23 cm Tube secured with: Tape Dental Injury: Teeth and Oropharynx as per pre-operative assessment  Difficulty Due To: Difficulty was anticipated Comments: Pt. maintained in neck neutral position for Glide Scope intubation. Dr. Vertell Limber present

## 2013-07-17 ENCOUNTER — Other Ambulatory Visit: Payer: Self-pay

## 2013-07-17 LAB — COMPREHENSIVE METABOLIC PANEL
ALBUMIN: 2.6 g/dL — AB (ref 3.5–5.2)
ALK PHOS: 81 U/L (ref 39–117)
ALT: 38 U/L — ABNORMAL HIGH (ref 0–35)
AST: 48 U/L — AB (ref 0–37)
BILIRUBIN TOTAL: 0.3 mg/dL (ref 0.3–1.2)
BUN: 13 mg/dL (ref 6–23)
CHLORIDE: 100 meq/L (ref 96–112)
CO2: 28 mEq/L (ref 19–32)
Calcium: 9.1 mg/dL (ref 8.4–10.5)
Creatinine, Ser: 0.41 mg/dL — ABNORMAL LOW (ref 0.50–1.10)
GFR calc Af Amer: >90 mL/min (ref >90)
Glucose, Bld: 139 mg/dL — ABNORMAL HIGH (ref 70–99)
POTASSIUM: 4.4 meq/L (ref 3.7–5.3)
Sodium: 143 mEq/L (ref 137–147)
Total Protein: 7 g/dL (ref 6.0–8.3)

## 2013-07-17 LAB — CBC
HEMATOCRIT: 35.2 % — AB (ref 36.0–46.0)
Hemoglobin: 11.8 g/dL — ABNORMAL LOW (ref 12.0–15.0)
MCH: 32 pg (ref 26.0–34.0)
MCHC: 33.5 g/dL (ref 30.0–36.0)
MCV: 95.4 fL (ref 78.0–100.0)
PLATELETS: 189 10*3/uL (ref 150–400)
RBC: 3.69 MIL/uL — ABNORMAL LOW (ref 3.87–5.11)
RDW: 13.5 % (ref 11.5–15.5)
WBC: 12 10*3/uL — AB (ref 4.0–10.5)

## 2013-07-17 LAB — GLUCOSE, CAPILLARY
GLUCOSE-CAPILLARY: 214 mg/dL — AB (ref 70–99)
Glucose-Capillary: 123 mg/dL — ABNORMAL HIGH (ref 70–99)
Glucose-Capillary: 138 mg/dL — ABNORMAL HIGH (ref 70–99)

## 2013-07-17 MED ORDER — LORAZEPAM 2 MG/ML IJ SOLN
1.0000 mg | Freq: Once | INTRAMUSCULAR | Status: AC
  Administered 2013-07-17: 1 mg via INTRAVENOUS
  Filled 2013-07-17: qty 1

## 2013-07-17 MED ORDER — CLONIDINE HCL 0.1 MG PO TABS
0.1000 mg | ORAL_TABLET | Freq: Two times a day (BID) | ORAL | Status: DC | PRN
  Administered 2013-07-17: 0.1 mg via ORAL
  Filled 2013-07-17: qty 1

## 2013-07-17 NOTE — Progress Notes (Signed)
Patient stated that  Son was going to bring her personal CPAP from home.  Advised that if she wanted to use the CPAP tonight to advise RT.  RT will continue to monitor.

## 2013-07-17 NOTE — Progress Notes (Signed)
Patient ID: [name], female   DOB: [birth year], 79 y.o.   MRN: [id number]         [facility]    Date of Admission:  07/15/2013           Day 2 vancomycin        Day 2 ceftriaxone  Principal Problem:   Spinal epidural abscess Active Problems:   Acute respiratory failure   OSA on CPAP   . aspirin  81 mg Oral Daily  . calcium carbonate  2 tablet Oral Q breakfast  . cefTRIAXone (ROCEPHIN)  IV  2 g Intravenous Q12H  . cholecalciferol  2,000 Units Oral Daily  . cyanocobalamin  500 mcg Oral Daily  . dexamethasone  4 mg Intravenous 4 times per day   Or  . dexamethasone  4 mg Oral 4 times per day  . docusate sodium  100 mg Oral BID  . ferrous fumarate  1 tablet Oral Once per day on Mon Wed Fri  . fluticasone  2 spray Each Nare Daily  . furosemide  20 mg Oral Daily  . lisinopril  5 mg Oral Daily  . loratadine  10 mg Oral Daily  . mometasone-formoterol  2 puff Inhalation BID  . multivitamin with minerals  1 tablet Oral Daily  . oxybutynin  2.5 mg Oral BID  . senna  1 tablet Oral BID  . traZODone  50 mg Oral QHS  . vancomycin  1,500 mg Intravenous Once  . vancomycin  1,500 mg Intravenous Q24H   Objective: Temp:  [97.3 F (36.3 C)-99.1 F (37.3 C)] 97.3 F (36.3 C) (03/07 0824) Pulse Rate:  [73-95] 80 (03/07 1300) Resp:  [12-26] 18 (03/07 1300) BP: (149-196)/(69-156) 149/70 mmHg (03/07 1300) SpO2:  [88 %-99 %] 96 % (03/07 1300)  Lab Results Lab Results  Component Value Date   WBC 12.0* 07/17/2013   HGB 11.8* 07/17/2013   HCT 35.2* 07/17/2013   MCV 95.4 07/17/2013   PLT 189 07/17/2013    Lab Results  Component Value Date   CREATININE 0.41* 07/17/2013   BUN 13 07/17/2013   NA 143 07/17/2013   K 4.4 07/17/2013   CL 100 07/17/2013   CO2 28 07/17/2013    Microbiology: Recent Results (from the past 240 hour(s))  CULTURE, ROUTINE-ABSCESS     Status: None   Collection Time    07/16/13  4:15 AM      Result Value Ref Range Status   Specimen Description  ABSCESS LEFT NECK   Final   Special Requests NONE   Final   Gram Stain     Final   Value: ABUNDANT WBC PRESENT,BOTH PMN AND MONONUCLEAR     NO SQUAMOUS EPITHELIAL CELLS SEEN     ABUNDANT GRAM POSITIVE COCCI     IN PAIRS     Performed at [facility]   Culture     Final   Value: Culture reincubated for better growth     Performed at [facility]   Report Status PENDING   Incomplete  ANAEROBIC CULTURE     Status: None   Collection Time    07/16/13  4:15 AM      Result Value Ref Range Status   Specimen Description ABSCESS LEFT NECK   Final   Special Requests NONE   Final   Gram Stain PENDING   Incomplete   Culture     Final   Value: NO ANAEROBES ISOLATED; CULTURE IN  PROGRESS FOR 5 DAYS     Performed at [facility]   Report Status PENDING   Incomplete  CULTURE, ROUTINE-ABSCESS     Status: None   Collection Time    07/16/13  4:19 AM      Result Value Ref Range Status   Specimen Description ABSCESS LEFT NECK   Final   Special Requests NONE   Final   Gram Stain PENDING   Incomplete   Culture     Final   Value: Culture reincubated for better growth     Performed at [facility]   Report Status PENDING   Incomplete  ANAEROBIC CULTURE     Status: None   Collection Time    07/16/13  4:19 AM      Result Value Ref Range Status   Specimen Description ABSCESS LEFT NECK   Final   Special Requests NONE   Final   Gram Stain     Final   Value: ABUNDANT WBC PRESENT,BOTH PMN AND MONONUCLEAR     NO SQUAMOUS EPITHELIAL CELLS SEEN     ABUNDANT GRAM POSITIVE COCCI     IN PAIRS     Performed at [facility]   Culture     Final   Value: NO ANAEROBES ISOLATED; CULTURE IN PROGRESS FOR 5 DAYS     Performed at [facility]   Report Status PENDING   Incomplete  TISSUE CULTURE     Status: None   Collection Time    07/16/13  4:24 AM      Result Value Ref Range Status   Specimen Description TISSUE LEFT NECK   Final   Special Requests NO 3 IN CUP    Final   Gram Stain     Final   Value: RARE WBC PRESENT, PREDOMINANTLY PMN     NO ORGANISMS SEEN     Performed at [facility]   Culture PENDING   Incomplete   Report Status PENDING   Incomplete  MRSA PCR SCREENING     Status: None   Collection Time    07/16/13  5:33 AM      Result Value Ref Range Status   MRSA by PCR NEGATIVE  NEGATIVE Final   Comment:            The GeneXpert MRSA Assay (FDA     approved for NASAL specimens     only), is one component of a     comprehensive MRSA colonization     surveillance program. It is not     intended to diagnose MRSA     infection nor to guide or     monitor treatment for     MRSA infections.   Assessment: The Gram stain of her cervical epidural abscess fluid suggests streptococcal or staphylococcal infection but I will continue current empiric therapy pending final culture results.  Plan: 1. Continue vancomycin and ceftriaxone pending final cultures  Michel Bickers, MD [facility] [phone] pager   [phone] cell 07/17/2013, 2:54 PM

## 2013-07-17 NOTE — Evaluation (Signed)
Clinical/Bedside Swallow Evaluation Patient Details  Name: [name] MRN: [id number] Date of Birth: 1935-05-12  Today's Date: 07/17/2013 Time: 0752-0826 SLP Time Calculation (min): 34 min  Past Medical History:  Past Medical History  Diagnosis Date  . Hypertension   . Asthma   . Cancer     Skin Cancer  . Sleep apnea    Past Surgical History:  Past Surgical History  Procedure Laterality Date  . Gastric bypass     HPI:  Patient is a 79 year old female with history of hypertension, COPD, obstructive sleep apnea, morbid obesity who presents emergency Department with pain in between her shoulder blades that she is unable to describe that started on Monday, 3 days ago.  During her course in the ER, the patient developed progressive quadriparesis to the point that she was barely able to move her legs and could not move her hands. Neurosurgeon suspected cervical spinal epidural abcess, proceeded with ACDF C 4/5, C 5/6, C 6/7 levels with plating C 4-7 levels. A large osteophyte was removed on the right with decompression of the right C5 nerve root. Pt was intubated from midnight on 3/6 until mid am on 3/6, extubated due to agitation.    Assessment / Plan / Recommendation Clinical Impression  Pt demonstrates suprisingly good swallow function in subjective eval. Vocal quality is clear, edema does not appear severe. Pt consumed over 16 oz of liquids without cough, wet vocal quality or any multiple swallows, indicating low risk of silent aspiration or significant residue. Concerned that pt is at risk for increasing edema which could impact swallowing into the next few days. Pt may initiate a regular diet with thin liquids with close supervision. Please call if any concerns arise, will plan f/u for tolerance.     Aspiration Risk  Moderate    Diet Recommendation Regular;Thin liquid   Liquid Administration via: Cup;Straw Medication Administration: Whole meds with liquid Supervision: Patient  able to self feed Postural Changes and/or Swallow Maneuvers: Seated upright 90 degrees    Other  Recommendations Oral Care Recommendations: Oral care BID   Follow Up Recommendations  24 hour supervision/assistance    Frequency and Duration min 2x/week  1 week   Pertinent Vitals/Pain NA    SLP Swallow Goals     Swallow Study Prior Functional Status       General HPI: Patient is a 79 year old female with history of hypertension, COPD, obstructive sleep apnea, morbid obesity who presents emergency Department with pain in between her shoulder blades that she is unable to describe that started on Monday, 3 days ago.  During her course in the ER, the patient developed progressive quadriparesis to the point that she was barely able to move her legs and could not move her hands. Neurosurgeon suspected cervical spinal epidural abcess, proceeded with ACDF C 4/5, C 5/6, C 6/7 levels with plating C 4-7 levels. A large osteophyte was removed on the right with decompression of the right C5 nerve root. Pt was intubated from midnight on 3/6 until mid am on 3/6, extubated due to agitation.  Type of Study: Bedside swallow evaluation Previous Swallow Assessment: none Diet Prior to this Study: NPO Temperature Spikes Noted: No Respiratory Status: Nasal cannula History of Recent Intubation: Yes Length of Intubations (days): 1 days (less than one day) Date extubated: 07/16/13 Behavior/Cognition: Alert;Cooperative;Pleasant mood Oral Cavity - Dentition: Adequate natural dentition Self-Feeding Abilities: Able to feed self Patient Positioning: Upright in chair Baseline Vocal Quality: Clear Volitional Cough:  Strong Volitional Swallow: Able to elicit    Oral/Motor/Sensory Function Overall Oral Motor/Sensory Function: Appears within functional limits for tasks assessed   Ice Chips     Thin Liquid Thin Liquid: Within functional limits Presentation: Cup;Straw;Self Fed    Nectar Thick Nectar Thick Liquid:  Not tested   Honey Thick Honey Thick Liquid: Not tested   Puree Puree: Within functional limits Presentation: Self Fed;Spoon   Solid   GO    Solid: Not tested      Herbie Baltimore, MA CCC-SLP [phone]  Matheu Ploeger, Katherene Ponto 07/17/2013,8:58 AM

## 2013-07-17 NOTE — Evaluation (Signed)
Occupational Therapy Evaluation Patient Details Name: SKYLYNN BOTERO MRN: [id number] DOB: 09-29-1934 Today's Date: 07/17/2013 Time: [id number] OT Time Calculation (min): 50 min  OT Assessment / Plan / Recommendation History of present illness 79 yo female admitted with spinal epidural abscess undergoing ACDF.     Clinical Impression   Patient is s/p ACDF  Surgery remove abscess resulting in functional limitations due to the deficits listed below (see OT problem list). Pt lives alone currently. Pt has strong family and friend support Patient will benefit from skilled OT acutely to increase independence and safety with ADLS to allow discharge CIR.     OT Assessment  Patient needs continued OT Services    Follow Up Recommendations  CIR    Barriers to Discharge Decreased caregiver support son's both work full time- lives alone. Has a sister and close friends that can help provide support.   Equipment Recommendations  Other (comment) (defer to CIR)    Recommendations for Other Services Rehab consult  Frequency  Min 3X/week    Precautions / Restrictions Precautions Precautions: Fall Restrictions Weight Bearing Restrictions: No   Pertinent Vitals/Pain No pain reported at this time Oxygen on RA 88%    ADL  Eating/Feeding: NPO Grooming: Wash/dry face;Teeth care;Minimal assistance (unable to open containers) Where Assessed - Grooming: Supported standing Upper Body Bathing: Chest;Right arm;Left arm;Abdomen;Simulated;Moderate assistance Where Assessed - Upper Body Bathing: Supported sitting Lower Body Dressing: Maximal assistance Where Assessed - Lower Body Dressing: Supported sit to Lobbyist: +2 Total assistance;Maximal assistance Toilet Transfer Method: Sit to Loss adjuster, chartered: Raised toilet seat with arms (or 3-in-1 over toilet) Toileting - Clothing Manipulation and Hygiene: Maximal assistance Where Assessed - Toileting Clothing Manipulation and  Hygiene: Sit to stand from 3-in-1 or toilet Equipment Used: Gait belt;Rolling walker;Other (comment) (oxygen tank) Transfers/Ambulation Related to ADLs: pt requires ambulation with oxygen at this time. pt decr oxygen saturations in chair talking to 88%. pt unaware of decr oxygen . Pt needs cues for pulsed lip breathing to rebound at times. pt static standing at sink for adls with flexed posture and knee flexion due to bil Le weakness. pt with bil knee flexion increasing with fatigue and slowly descending toward floor with pt unaware. pt needed cues to correct posture and return to static standing upright posture. pt requires rest break at sink with grooming due to bil le weakness. Pt ambulating short distance and asked to return to sitting due to lack awareness to bil LE weakness. Pt motivated and attempting to progress further than what is safe currently. Pt is curently at fall risk for reporting ability to progress further than what is safe. ADL Comments: pt reports waking this am unaware of current surroundings. pt reports after 3 or 4 attempts exiting the bed alone over the bed rail. pt currently positioned in recliner. Pt needs physical (A) of 2 for sit<>stand. Pt hyper extending Rt knee with ambulation to progress. Pt pleasant and cheerful at this time. Pt static standing at sink for grooming but able to tolerate due to fatigue and decr oxygen    OT Diagnosis: Generalized weakness;Cognitive deficits;Acute pain  OT Problem List: Decreased strength;Decreased range of motion;Decreased activity tolerance;Impaired balance (sitting and/or standing);Decreased knowledge of use of DME or AE;Decreased knowledge of precautions;Decreased cognition;Decreased safety awareness;Obesity;Pain;Impaired UE functional use OT Treatment Interventions: Self-care/ADL training;Therapeutic exercise;Neuromuscular education;DME and/or AE instruction;Therapeutic activities;Cognitive remediation/compensation;Patient/family  education;Balance training   OT Goals(Current goals can be found in the care plan section) Acute Rehab OT Goals Patient  Stated Goal: to go home OT Goal Formulation: With patient/family Time For Goal Achievement: 07/31/13 Potential to Achieve Goals: Good  Visit Information  Last OT Received On: 07/17/13 Assistance Needed: +2 (for sit<>stand and chair follow) PT/OT/SLP Co-Evaluation/Treatment: Yes Reason for Co-Treatment: Complexity of the patient's impairments (multi-system involvement) PT goals addressed during session: Mobility/safety with mobility OT goals addressed during session: ADL's and self-care History of Present Illness: 79 yo female admitted with spinal epidural abscess undergoing ACDF.         Prior [place] expects to be discharged to:: Private residence Living Arrangements: Alone Available Help at Discharge: Family;Available 24 hours/day (maybe sister for a few weeks per pt) Type of Home: House Home Access: Stairs to enter [employer] of Steps: 3 Entrance Stairs-Rails: None Home Layout: One level Home Equipment: Shower seat;Tub bench;Walker - 2 wheels;Walker - 4 wheels;Transport chair;Bedside commode;Grab bars - tub/shower;Grab bars - toilet;Hand held shower head;Cane - single point (base for recliner) Additional Comments: baseline patient uses no DME but has DME from bil knee surg in the past Prior Function Level of Independence: Independent Communication Communication: No difficulties Dominant Hand: Left         Vision/Perception Vision - History Baseline Vision: Wears glasses all the time Patient Visual Report: No change from baseline Vision - Assessment Vision Assessment: Vision not tested Perception Perception: Within Functional Limits Praxis Praxis: Not tested   Cognition  Cognition Arousal/Alertness: Awake/alert Behavior During Therapy: Impulsive;Restless Overall Cognitive Status:  Impaired/Different from baseline Area of Impairment: Orientation;Following commands;Safety/judgement;Awareness;Problem solving;Attention Orientation Level: Disoriented to;Situation;Place (Woke up and did not know where she was and got up.) Current Attention Level: Focused Following Commands: Follows one step commands consistently Safety/Judgement: Decreased awareness of safety;Decreased awareness of deficits Awareness: Emergent;Anticipatory Problem Solving: Difficulty sequencing;Requires verbal cues;Requires tactile cues General Comments: Pt with intermittent confusion.  Got up by herself earlier today.  At present, pt is oriented during the session. Pt needs cues for safety. Pt unaware of deficits fully.      Extremity/Trunk Assessment Upper Extremity Assessment Upper Extremity Assessment: Generalized weakness;RUE deficits/detail;LUE deficits/detail RUE Deficits / Details: decr proprioception deficts, decr fine motor unable to open tooth paste or translate comb to finger tips. Pt demonstrates shoulder flexion 90 degrees (did not allow past 90 due to acdf), supination/ pronation WFL, elbow and wrist WFL. Pt with 3rd 4th 5th digit baseline flexed pip joint . Pt reports today with more extension than normal after surg RUE Sensation: decreased proprioception RUE Coordination: decreased fine motor;decreased gross motor LUE Deficits / Details: 3+ out 5 AROM shoulder . Not assessed past 90 degrees fleixon due to acdf LUE Coordination: decreased fine motor Lower Extremity Assessment Lower Extremity Assessment: Defer to PT evaluation RLE Deficits / Details: grossly 2+/5  RLE Sensation: decreased proprioception RLE Coordination: decreased gross motor;decreased fine motor LLE Deficits / Details: grossly 3-/5 LLE Sensation: decreased light touch;decreased proprioception Cervical / Trunk Assessment Cervical / Trunk Assessment: Kyphotic     Mobility Bed Mobility Overal bed mobility:  (not  assessed) Transfers Overall transfer level: Needs assistance Equipment used: Rolling walker (2 wheeled) Transfers: Sit to/from Stand Sit to Stand: Max assist;+2 physical assistance General transfer comment: Pt at times needing mod assist to stand from low surface however as she fatigued, she had greater difficulty getting up from recliner.  Last stand attempt, needed to use pad to asssit her to standing.  Also needed cues for hand placement as pt trying to pull up on RW  each attempt.       Exercise     Balance Balance Overall balance assessment: Needs assistance Sitting-balance support: Bilateral upper extremity supported;Feet supported Standing balance support: Bilateral upper extremity supported;During functional activity Standing balance-Leahy Scale: Poor Standing balance comment: Pt stood at sink to wash face, brush teeth and comb hair.  see OT note for full details.  As pt fatigued, flexed at hips, knees and trunk requiring mod assist for stability.  Pt had at least one UE supported entire time standing. When she did let go without bil UEs lost balance and needing mod assist to recover balance.     End of Session OT - End of Session Activity Tolerance: Patient tolerated treatment well Patient left: in chair;with call bell/phone within reach;with chair alarm set;with nursing/sitter in room (chair alarm due to bed exit without (A) this AM) Nurse Communication: Mobility status;Precautions  GO     Peri Maris 07/17/2013, 10:46 AM Pager: [phone]

## 2013-07-17 NOTE — Progress Notes (Signed)
Patient ID: [name], female   DOB: 05/27/1934, 79 y.o.   MRN: [id number] BP 160/74  Pulse 73  Temp(Src) 97.3 F (36.3 C) (Axillary)  Resp 14  Ht 5\' 3"  (1.6 m)  Wt 119.5 kg (263 lb 7.2 oz)  BMI 46.68 kg/m2  SpO2 97% BP 160/74  Pulse 73  Temp(Src) 97.3 F (36.3 C) (Axillary)  Resp 14  Ht 5\' 3"  (1.6 m)  Wt 119.5 kg (263 lb 7.2 oz)  BMI 46.68 kg/m2  SpO2 97% Alert and oriented x4, voice is strong Moving all extremities, weakness in hands bilaterally. 4/5 at least Lower extremities ~5/5 Wound is clean, dry, no signs of infection.

## 2013-07-17 NOTE — Progress Notes (Signed)
Speech Language Pathology Treatment: Dysphagia  Patient Details Name: [name] MRN: [id number] DOB: Jul 28, 1934 Today's Date: 07/17/2013 Time: 4917-9150 SLP Time Calculation (min): 8 min  Assessment / Plan / Recommendation Clinical Impression  F/U with pt upon arrival with am meal. Pt observed to tolerate regular textures well without any complaint of globus or evidence of aspiration. At this point, SLP may sign off but please reorder if there is any evidence on increased swelling or difficulty.    HPI HPI: Patient is a 79 year old female with history of hypertension, COPD, obstructive sleep apnea, morbid obesity who presents emergency Department with pain in between her shoulder blades that she is unable to describe that started on Monday, 3 days ago.  During her course in the ER, the patient developed progressive quadriparesis to the point that she was barely able to move her legs and could not move her hands. Neurosurgeon suspected cervical spinal epidural abcess, proceeded with ACDF C 4/5, C 5/6, C 6/7 levels with plating C 4-7 levels. A large osteophyte was removed on the right with decompression of the right C5 nerve root. Pt was intubated from midnight on 3/6 until mid am on 3/6, extubated due to agitation.    Pertinent Vitals NA  SLP Plan  Discharge SLP treatment due to (comment);All goals met    Recommendations Diet recommendations: Regular;Thin liquid Liquids provided via: Cup Medication Administration: Whole meds with liquid Supervision: Patient able to self feed Postural Changes and/or Swallow Maneuvers: Seated upright 90 degrees              Oral Care Recommendations: Oral care BID Follow up Recommendations: 24 hour supervision/assistance Plan: Discharge SLP treatment due to (comment);All goals met    GO     Carlyon Nolasco, Katherene Ponto 07/17/2013, 9:57 AM

## 2013-07-17 NOTE — Evaluation (Signed)
Physical Therapy Evaluation Patient Details Name: [name] MRN: [id number] DOB: 05-21-1934 Today's Date: 07/17/2013 Time: 6767-2094 PT Time Calculation (min): 60 min  PT Assessment / Plan / Recommendation History of Present Illness   Patient is a 79 year old female with history of hypertension, COPD, obstructive sleep apnea, morbid obesity who presents emergency Department with pain in between her shoulder blades that she is unable to describe that started on Monday, 3 days ago. During her course in the ER, the patient developed progressive quadriparesis to the point that she was barely able to move her legs and could not move her hands. Neurosurgeon suspected cervical spinal epidural abcess, proceeded with ACDF C 4/5, C 5/6, C 6/7 levels with plating C 4-7 levels. A large osteophyte was removed on the right with decompression of the right C5 nerve root. Pt was intubated from midnight on 3/6 until mid am on 3/6, extubated due to agitation.    Clinical Impression  Pt admitted with above. Pt currently with functional limitations due to the deficits listed below (see PT Problem List).  Sons are not available at d/c to assist pt but want pt to go to Rehab for most intensive therapy.   Pt will benefit from skilled PT to increase their independence and safety with mobility to allow discharge to the venue listed below.     PT Assessment  Patient needs continued PT services    Follow Up Recommendations  CIR;Supervision/Assistance - 24 hour    Does the patient have the potential to tolerate intense rehabilitation      Barriers to Discharge Decreased caregiver support (sons work)      Optician, dispensing  Other (comment) (TBA)    Recommendations for Other Services Rehab consult   Frequency Min 5X/week    Precautions / Restrictions Precautions Precautions: Fall Restrictions Weight Bearing Restrictions: No   Pertinent Vitals/Pain Desats to 87-90% on RA.  94% and > with 2LO2.   Other VSS, No pain.      Mobility  Transfers Overall transfer level: Needs assistance Equipment used: Rolling walker (2 wheeled) Transfers: Sit to/from Stand Sit to Stand: Max assist;+2 physical assistance General transfer comment: Pt at times needing mod assist to stand from low surface however as she fatigued, she had greater difficulty getting up from recliner.  Last stand attempt, needed to use pad to asssit her to standing.  Also needed cues for hand placement as pt trying to pull up on RW each attempt.   Ambulation/Gait Ambulation/Gait assistance: Mod assist;+2 safety/equipment Ambulation Distance (Feet): 96 Feet (10 feet then 46 feet and then 40 feet) Assistive device: Rolling walker (2 wheeled) Gait Pattern/deviations: Step-through pattern;Decreased dorsiflexion - right;Decreased dorsiflexion - left;Steppage;Drifts right/left;Trunk flexed;Wide base of support (Pt with notable right knee hyperextension) Gait velocity interpretation: at or above normal speed for age/gender General Gait Details: Pt impulsive with movements decreasing her safety.  Pt with right knee hyperextension throughout gait.  Noted pt seemed to have left knee hyperextension as well with fatigue as well as flexed posture with fatigue as well.  Pt drifts left when ambulating. Needed mod assist at times for tactile cuing for posture as well as mod cues for sequencing steps and steering RW.  Needed several seated rest breaks as well due to as she fatigues, safety worsens.  Desat to 87-90 % on RA therefore ambulated pt on 2L with O2 94% and >.      Exercises     PT Diagnosis: Generalized weakness  PT Problem  List: Decreased activity tolerance;Decreased strength;Decreased balance;Decreased mobility;Decreased knowledge of use of DME;Decreased coordination;Decreased knowledge of precautions;Decreased safety awareness PT Treatment Interventions: DME instruction;Gait training;Functional mobility training;Therapeutic  exercise;Therapeutic activities;Balance training;Patient/family education;Cognitive remediation     PT Goals(Current goals can be found in the care plan section) Acute Rehab PT Goals Patient Stated Goal: to go home PT Goal Formulation: With patient Time For Goal Achievement: 07/24/13 Potential to Achieve Goals: Good  Visit Information  Last PT Received On: 07/17/13 Assistance Needed: +2 PT/OT/SLP Co-Evaluation/Treatment: Yes Reason for Co-Treatment: Complexity of the patient's impairments (multi-system involvement) PT goals addressed during session: Mobility/safety with mobility History of Present Illness: Pt admit with spinal epidural abscess with ACDF.         Prior [place] expects to be discharged to:: Private residence Living Arrangements: Alone Available Help at Discharge: Family;Available 24 hours/day (maybe sister for a few weeks per pt) Type of Home: House Home Access: Stairs to enter [employer] of Steps: 3 Entrance Stairs-Rails: None Home Layout: One level Home Equipment: Shower seat;Tub bench;Walker - 2 wheels;Walker - 4 wheels;Transport chair;Bedside commode;Grab bars - tub/shower;Grab bars - toilet;Hand held shower head;Cane - single point (base for recliner) Prior Function Level of Independence: Independent Communication Communication: No difficulties Dominant Hand: Left    Cognition  Cognition Arousal/Alertness: Awake/alert Behavior During Therapy: Impulsive;Restless Overall Cognitive Status: Impaired/Different from baseline Area of Impairment: Orientation;Following commands;Safety/judgement;Awareness;Problem solving;Attention Orientation Level: Disoriented to;Place;Situation (Woke up and did not know where she was and got up.) Current Attention Level: Focused Following Commands: Follows one step commands consistently Safety/Judgement: Decreased awareness of safety;Decreased awareness of deficits Awareness:  Intellectual Problem Solving: Difficulty sequencing;Requires verbal cues;Requires tactile cues General Comments: Pt with intermittent confusion.  Got up by herself earlier today.  At present, pt is oriented during the session.      Extremity/Trunk Assessment Upper Extremity Assessment Upper Extremity Assessment: Defer to OT evaluation Lower Extremity Assessment Lower Extremity Assessment: RLE deficits/detail;LLE deficits/detail RLE Deficits / Details: grossly 2+/5  RLE Sensation: decreased proprioception RLE Coordination: decreased gross motor;decreased fine motor LLE Deficits / Details: grossly 3-/5 LLE Sensation: decreased light touch;decreased proprioception Cervical / Trunk Assessment Cervical / Trunk Assessment: Kyphotic   Balance Balance Overall balance assessment: Needs assistance Standing balance support: Bilateral upper extremity supported;During functional activity Standing balance-Leahy Scale: Poor Standing balance comment: Pt stood at sink to wash face, brush teeth and comb hair.  see OT note for full details.  As pt fatigued, flexed at hips, knees and trunk requiring mod assist for stability.  Pt had at least one UE supported entire time standing. When she did let go without bil UEs lost balance and needing mod assist to recover balance.    End of Session PT - End of Session Equipment Utilized During Treatment: Gait belt;Oxygen Activity Tolerance: Patient limited by fatigue Patient left: in chair;with call bell/phone within reach;with chair alarm set;with nursing/sitter in room Nurse Communication: Mobility status  GP     INGOLD,Angelus Hoopes 07/17/2013, 10:28 AM [facility] [phone] (pager)

## 2013-07-17 NOTE — Progress Notes (Signed)
Pt complained of SOB and Chest tightness.   12 EKG was obtained and MD was called. ELink MD returned called and reviewed EKG. Pt states that symptoms have resolved and pt stated "I think I'm having some anxiety." No additional orders were given.

## 2013-07-18 DIAGNOSIS — B954 Other streptococcus as the cause of diseases classified elsewhere: Secondary | ICD-10-CM

## 2013-07-18 LAB — GLUCOSE, CAPILLARY: GLUCOSE-CAPILLARY: 140 mg/dL — AB (ref 70–99)

## 2013-07-18 NOTE — Progress Notes (Signed)
Patient ID: [name], female   DOB: Jul 29, 1934, 79 y.o.   MRN: [id number]         [facility] for Infectious Disease    Date of Admission:  07/15/2013           Day 3 vancomycin        Day 3 ceftriaxone  Principal Problem:   Spinal epidural abscess Active Problems:   Acute respiratory failure   OSA on CPAP   . aspirin  81 mg Oral Daily  . calcium carbonate  2 tablet Oral Q breakfast  . cefTRIAXone (ROCEPHIN)  IV  2 g Intravenous Q12H  . cholecalciferol  2,000 Units Oral Daily  . cyanocobalamin  500 mcg Oral Daily  . docusate sodium  100 mg Oral BID  . ferrous fumarate  1 tablet Oral Once per day on Mon Wed Fri  . fluticasone  2 spray Each Nare Daily  . furosemide  20 mg Oral Daily  . lisinopril  5 mg Oral Daily  . loratadine  10 mg Oral Daily  . mometasone-formoterol  2 puff Inhalation BID  . multivitamin with minerals  1 tablet Oral Daily  . oxybutynin  2.5 mg Oral BID  . senna  1 tablet Oral BID  . traZODone  50 mg Oral QHS  . vancomycin  1,500 mg Intravenous Once  . vancomycin  1,500 mg Intravenous Q24H   Objective: Temp:  [97.3 F (36.3 C)-98.7 F (37.1 C)] 97.3 F (36.3 C) (03/08 0822) Pulse Rate:  [64-103] 82 (03/08 1100) Resp:  [12-22] 18 (03/08 1200) BP: (114-179)/(49-101) 127/57 mmHg (03/08 1200) SpO2:  [84 %-100 %] 93 % (03/08 1200)  Lab Results Lab Results  Component Value Date   WBC 12.0* 07/17/2013   HGB 11.8* 07/17/2013   HCT 35.2* 07/17/2013   MCV 95.4 07/17/2013   PLT 189 07/17/2013    Lab Results  Component Value Date   CREATININE 0.41* 07/17/2013   BUN 13 07/17/2013   NA 143 07/17/2013   K 4.4 07/17/2013   CL 100 07/17/2013   CO2 28 07/17/2013    Microbiology: Recent Results (from the past 240 hour(s))  CULTURE, ROUTINE-ABSCESS     Status: None   Collection Time    07/16/13  4:15 AM      Result Value Ref Range Status   Specimen Description ABSCESS LEFT NECK   Final   Special Requests NONE   Final   Gram Stain     Final   Value: ABUNDANT  WBC PRESENT,BOTH PMN AND MONONUCLEAR     NO SQUAMOUS EPITHELIAL CELLS SEEN     ABUNDANT GRAM POSITIVE COCCI     IN PAIRS     Performed at [facility]   Culture     Final   Value: ABUNDANT MICROAEROPHILIC STREPTOCOCCI     Note: Standardized susceptibility testing for this organism is not available.     Performed at [facility]   Report Status PENDING   Incomplete  ANAEROBIC CULTURE     Status: None   Collection Time    07/16/13  4:15 AM      Result Value Ref Range Status   Specimen Description ABSCESS LEFT NECK   Final   Special Requests NONE   Final   Gram Stain PENDING   Incomplete   Culture     Final   Value: NO ANAEROBES ISOLATED; CULTURE IN PROGRESS FOR 5 DAYS     Performed at [facility]  Report Status PENDING   Incomplete  CULTURE, ROUTINE-ABSCESS     Status: None   Collection Time    07/16/13  4:19 AM      Result Value Ref Range Status   Specimen Description ABSCESS LEFT NECK   Final   Special Requests NONE   Final   Gram Stain PENDING   Incomplete   Culture     Final   Value: MODERATE MICROAEROPHILIC STREPTOCOCCI     Note: Standardized susceptibility testing for this organism is not available.     Performed at [facility]   Report Status PENDING   Incomplete  ANAEROBIC CULTURE     Status: None   Collection Time    07/16/13  4:19 AM      Result Value Ref Range Status   Specimen Description ABSCESS LEFT NECK   Final   Special Requests NONE   Final   Gram Stain     Final   Value: ABUNDANT WBC PRESENT,BOTH PMN AND MONONUCLEAR     NO SQUAMOUS EPITHELIAL CELLS SEEN     ABUNDANT GRAM POSITIVE COCCI     IN PAIRS     Performed at [facility]   Culture     Final   Value: NO ANAEROBES ISOLATED; CULTURE IN PROGRESS FOR 5 DAYS     Performed at [facility]   Report Status PENDING   Incomplete  TISSUE CULTURE     Status: None   Collection Time    07/16/13  4:24 AM      Result Value Ref Range Status   Specimen  Description TISSUE LEFT NECK   Final   Special Requests NO 3 IN CUP   Final   Gram Stain     Final   Value: RARE WBC PRESENT, PREDOMINANTLY PMN     NO ORGANISMS SEEN     Performed at [facility]   Culture     Final   Value: ABUNDANT MICROAEROPHILIC STREPTOCOCCI     Note: Standardized susceptibility testing for this organism is not available.     Performed at [facility]   Report Status PENDING   Incomplete  MRSA PCR SCREENING     Status: None   Collection Time    07/16/13  5:33 AM      Result Value Ref Range Status   MRSA by PCR NEGATIVE  NEGATIVE Final   Comment:            The GeneXpert MRSA Assay (FDA     approved for NASAL specimens     only), is one component of a     comprehensive MRSA colonization     surveillance program. It is not     intended to diagnose MRSA     infection nor to guide or     monitor treatment for     MRSA infections.   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  Plan: 1. Continue ceftriaxone 2. Discontinue vancomycin  Michel Bickers, MD [facility] for Infectious [place] [facility] [phone] pager   [phone] cell 07/18/2013, 2:06 PM

## 2013-07-18 NOTE — Progress Notes (Signed)
Rehab Admissions Coordinator Note:  Patient was screened by Cleatrice Burke for appropriateness for an Inpatient Acute Rehab Consult.  At this time, we are recommending Inpatient Rehab consult per PT and OT recommendations. Please order if you agree. Marland KitchenAARP Medicare insurance will have to approve.  Cleatrice Burke 07/18/2013, 6:16 PM  I can be reached at [phone].

## 2013-07-18 NOTE — Progress Notes (Signed)
Pt will place on home machine when ready. Pt encouraged to call RT if pt needs help placing mask on.

## 2013-07-18 NOTE — Progress Notes (Signed)
Clinical Social Work Department CLINICAL SOCIAL WORK PLACEMENT NOTE 07/18/2013  Patient:  [name], [name]  Account Number:  [id number] Admit date:  07/15/2013  Clinical Social Worker:  Tilden Fossa, Latanya Presser  Date/time:  07/18/2013 04:14 PM  Clinical Social Work is seeking post-discharge placement for this patient at the following level of care:   SKILLED NURSING   (*CSW will update this form in Epic as items are completed)   07/18/2013  Patient/family provided with [facility] Department of Clinical Social Work's list of facilities offering this level of care within the geographic area requested by the patient (or if unable, by the patient's family).  07/18/2013  Patient/family informed of their freedom to choose among providers that offer the needed level of care, that participate in Medicare, Medicaid or managed care program needed by the patient, have an available bed and are willing to accept the patient.    Patient/family informed of [facility]' ownership interest in [facility], as well as of the fact that they are under no obligation to receive care at this facility.  PASARR submitted to EDS on 02/04/2011 PASARR number received from EDS on 02/04/2011  FL2 transmitted to all facilities in geographic area requested by pt/family on  07/18/2013 FL2 transmitted to all facilities within larger geographic area on   Patient informed that his/her managed care company has contracts with or will negotiate with  certain facilities, including the following:     Patient/family informed of bed offers received:   Patient chooses bed at  Physician recommends and patient chooses bed at    Patient to be transferred to  on   Patient to be transferred to facility by   The following physician request were entered in Epic:   Additional Comments:  Tilden Fossa, MSW, [place] Worker [facility] Emergency Dept. [phone]

## 2013-07-18 NOTE — Progress Notes (Signed)
Clinical Social Work Department BRIEF PSYCHOSOCIAL ASSESSMENT 07/18/2013  Patient:  [name], [name]     Account Number:  [id number]     Admit date:  07/15/2013  Clinical Social Worker:  Su Monks  Date/Time:  07/18/2013 11:30 AM  Referred by:  Physician  Date Referred:  07/16/2013 Referred for  SNF Placement   Other Referral:   Interview type:  Patient Other interview type:    PSYCHOSOCIAL DATA Living Status:  ALONE Admitted from facility:   Level of care:   Primary support name:  Helia Haese [phone] / Clare Gandy Lora [phone] Primary support relationship to patient:  CHILD, ADULT Degree of support available:   Strong support from her two sons    CURRENT CONCERNS Current Concerns  Post-Acute Placement   Other Concerns:    SOCIAL WORK ASSESSMENT / PLAN Weekend CSW received referral for SNF placement as a backup to CIR. CSW met with patient to discuss rehab options at discharge. CSW introduced self and explained CSW role- Patient agreeable to speaking. Patient confirms that she is hoping to participate in CIR but is agreeable for CSW to initiate SNF search in [place]. Patient lives alone and does not currently have 24 hour assistance at home. Patient discussed the recent loss of her husband and her medical issues that lead to her current hospitalization. CSW provided emotional support to patient.  Patient was calm and cooperative during assessment. Patient's strengths include her sense of humor, her ability to articulate, and her motivation for recovery. Patient's two sons who live locally are a strong support system.   Assessment/plan status:  Information/Referral to [employer] Other assessment/ plan:   CSW will continue to follow patient's progress to provide support and assist with transition to SNF rehab if needed.   Information/referral to community resources:   CSW will intiate SNF search in [place] also provided patient with local SNF  listing.    PATIENT'S/FAMILY'S RESPONSE TO PLAN OF CARE: Patient appeared to be hopeful and motivated to participate in rehab. She has received SNF rehab previously at [facility] in [place] but does not wish to return unless she can get a private room. Her preference for facilities are as follows: 1. [facility]; 2. [place]; [place] with private room. Patient is not interested in [facility]. Patient is hopeful for CIR acceptance. Patient is eager to gain her functioning and mobility back.     Tilden Fossa, MSW, [place] Clinical Social Worker [facility] Emergency Dept. [phone]

## 2013-07-19 ENCOUNTER — Encounter (HOSPITAL_COMMUNITY): Payer: Self-pay | Admitting: Neurosurgery

## 2013-07-19 DIAGNOSIS — G061 Intraspinal abscess and granuloma: Secondary | ICD-10-CM

## 2013-07-19 DIAGNOSIS — G8250 Quadriplegia, unspecified: Secondary | ICD-10-CM

## 2013-07-19 LAB — CULTURE, ROUTINE-ABSCESS

## 2013-07-19 LAB — TISSUE CULTURE

## 2013-07-19 MED ORDER — GABAPENTIN 300 MG PO CAPS
300.0000 mg | ORAL_CAPSULE | Freq: Three times a day (TID) | ORAL | Status: DC
  Administered 2013-07-19 – 2013-07-22 (×10): 300 mg via ORAL
  Filled 2013-07-19 (×12): qty 1

## 2013-07-19 MED ORDER — DEXAMETHASONE 4 MG PO TABS
4.0000 mg | ORAL_TABLET | Freq: Three times a day (TID) | ORAL | Status: AC
  Administered 2013-07-19 (×3): 4 mg via ORAL
  Filled 2013-07-19 (×3): qty 1

## 2013-07-19 NOTE — Progress Notes (Signed)
Occupational Therapy Treatment Patient Details Name: [name] MRN: [id number] DOB: 04/30/1935 Today's Date: 07/19/2013 Time: 6761-9509 OT Time Calculation (min): 45 min  OT Assessment / Plan / Recommendation  History of present illness 79 yo female admitted with spinal epidural abscess undergoing ACDF.     OT comments  Pt requires mod - total A for BADLs.  She complains of pain burning sensation bil. UEs and "hurting all over".  Feel she would greatly benefit from CIR.   Follow Up Recommendations  CIR    Barriers to Discharge       Equipment Recommendations  None recommended by OT    Recommendations for Other Services Rehab consult  Frequency Min 3X/week   Progress towards OT Goals Progress towards OT goals: Progressing toward goals  Plan Discharge plan remains appropriate    Precautions / Restrictions Precautions Precautions: Fall Restrictions Weight Bearing Restrictions: No   Pertinent Vitals/Pain     ADL  Eating/Feeding: Minimal assistance Where Assessed - Eating/Feeding: Chair;Bed level Grooming: Wash/dry hands;Teeth care;Brushing hair;Moderate assistance Where Assessed - Grooming: Supported standing Toilet Transfer: Moderate assistance (+2) Toilet Transfer Method: Sit to stand;Stand pivot Toilet Transfer Equipment: Raised toilet seat with arms (or 3-in-1 over toilet) Toileting - Clothing Manipulation and Hygiene: Moderate assistance Where Assessed - Toileting Clothing Manipulation and Hygiene: Sit to stand from 3-in-1 or toilet Equipment Used: Gait belt;Rolling walker Transfers/Ambulation Related to ADLs: mod A +2 with RW (occasionally min A +2) ADL Comments: Pt with flexed posture during BADLs at sink.  Mod verbal cues to stand erect.  Mod verbal cues for cervical precautions.  Pt. demonstrates difficulty maneuvering bed to position self correctly for feeding.  Instructed pt in remote bed controls and she was able to use with supervision.  Spoke with Nursing  about getting a B-safe clip to secure remote to bed clip.   Pt demonstrates Rt. hand 3+/5 strength and Lt. 3+/5-4-/5.  She was able to open toothpaste and squeeze toothpaste onto toothbrush.      OT Diagnosis:    OT Problem List:   OT Treatment Interventions:     OT Goals(current goals can now be found in the care plan section) ADL Goals Pt Will Perform Grooming: with min assist;sitting;standing Pt Will Perform Upper Body Bathing: with min assist;sitting Pt Will Perform Lower Body Bathing: sit to/from stand;with adaptive equipment;with mod assist Pt Will Transfer to Toilet: with mod assist;bedside commode Additional ADL Goal #1: Pt will complete bed mobility Min (A) without bed rails and hob ~20 degrees or less Additional ADL Goal #2: Pt will return demonstrates BIL UE HEP Supervision level with theraputty  Visit Information  Last OT Received On: 07/19/13 Assistance Needed: +2 (chair to follow) PT/OT/SLP Co-Evaluation/Treatment: Yes Reason for Co-Treatment: Complexity of the patient's impairments (multi-system involvement) PT goals addressed during session: Mobility/safety with mobility OT goals addressed during session: ADL's and self-care History of Present Illness: 79 yo female admitted with spinal epidural abscess undergoing ACDF.      Subjective Data      Prior Functioning       Cognition  Cognition Arousal/Alertness: Awake/alert Behavior During Therapy: Impulsive;Restless Overall Cognitive Status: Impaired/Different from baseline Area of Impairment: Following commands;Safety/judgement;Awareness;Problem solving;Attention Current Attention Level: Sustained Memory: Decreased recall of precautions Following Commands: Follows one step commands consistently Safety/Judgement: Decreased awareness of safety;Decreased awareness of deficits Awareness: Emergent Problem Solving: Difficulty sequencing;Requires verbal cues;Requires tactile cues General Comments: Pt oriented today.       Mobility  Bed Mobility Overal bed mobility:  Needs Assistance Bed Mobility: Supine to Sit Supine to sit: Mod assist;+2 for physical assistance General bed mobility comments: Able to roll without assist however needed assist as right UE would not push pt up from bed.   Transfers Overall transfer level: Needs assistance Equipment used: Rolling walker (2 wheeled) Transfers: Sit to/from Stand Sit to Stand: +2 physical assistance;Max assist General transfer comment: Pt at times needing mod assist to stand from low surface however as she fatigued, she had greater difficulty getting up from recliner. Pt stuggled to get her body forward to stand up.  Took several attempts at times.      Exercises  General Exercises - Lower Extremity Ankle Circles/Pumps: AROM;Both;10 reps;Seated Long Arc Quad: AROM;Both;10 reps;Seated Hip Flexion/Marching: AAROM;Both;5 reps;Seated   Balance Balance Overall balance assessment: Needs assistance Standing balance support: Bilateral upper extremity supported Standing balance-Leahy Scale: Poor Standing balance comment: Pt stood at sink to brush teeth with great difficulty today to maintain upright posture.  Pt was flexed at hips, knees and trunk requiring max assist for stability by end of stand.  Pt needs cues to know when she needs a break with pt leaning onto sink to brush her teeth with flexed back and hips.  Pt seemed to need bil UE support throughout due to poorer postural stability today.    End of Session OT - End of Session Equipment Utilized During Treatment: Rolling walker;Gait belt Activity Tolerance: Patient tolerated treatment well Patient left: in chair;with call bell/phone within reach;with family/visitor present Nurse Communication: Mobility status  [place], Amatullah Christy M 07/19/2013, 5:18 PM

## 2013-07-19 NOTE — Progress Notes (Signed)
Subjective: Patient reports "I just need something that lasts for pain. I have this burning pain all over"  Objective: Vital signs in last 24 hours: Temp:  [97.3 F (36.3 C)-98.7 F (37.1 C)] 98.7 F (37.1 C) (03/09 0623) Pulse Rate:  [65-103] 73 (03/09 0623) Resp:  [13-20] 16 (03/09 0623) BP: (114-195)/(53-91) 195/80 mmHg (03/09 0623) SpO2:  [92 %-99 %] 94 % (03/09 0623)  Intake/Output from previous day: 03/08 0701 - 03/09 0700 In: 57 [IV Piggyback:50] Out: 750 [Urine:750] Intake/Output this shift: Total I/O In: -  Out: 150 [Urine:150]  Alert, anxious, no apparent position of comfort. Reporting Percocet lasts only 1hour for burning dysesthesias all extremities and scapulae. Strength continues to improve BLE, hand intrinsiscs remain weak.   Lab Results:  Recent Labs  07/17/13 0253  WBC 12.0*  HGB 11.8*  HCT 35.2*  PLT 189   BMET  Recent Labs  07/17/13 0253  NA 143  K 4.4  CL 100  CO2 28  GLUCOSE 139*  BUN 13  CREATININE 0.41*  CALCIUM 9.1    Studies/Results: No results found.  Assessment/Plan: Improving   LOS: 4 days  Orders rec'd from DrStern & entered: Decadron 4mg  TID today; Gabapentin 300mg  TID; Soft Cervical Collar.  PLan to transfer to CIR when bed available/insurance approval.   Verdis Prime 07/19/2013, 7:46 AM

## 2013-07-19 NOTE — Progress Notes (Signed)
Physical Therapy Treatment Patient Details Name: [name] MRN: [id number] DOB: Jan 24, 1935 Today's Date: 07/19/2013 Time: 6962-9528 PT Time Calculation (min): 33 min  PT Assessment / Plan / Recommendation  History of Present Illness 79 yo female admitted with spinal epidural abscess undergoing ACDF.     PT Comments   Pt admitted with above. Pt currently with functional limitations due to balance and endurance deficits listed below (see PT Problem List). MD:  Pt c/o incr pain in scapula region as well as right hip.  Pt also feels like right side is weaker and burning more today.   Pt will benefit from skilled PT to increase their independence and safety with mobility to allow discharge to the venue listed below.   Follow Up Recommendations  CIR;Supervision/Assistance - 24 hour     Does the patient have the potential to tolerate intense rehabilitation     Barriers to Discharge        Equipment Recommendations  Other (comment) (TBA)    Recommendations for Other Services Rehab consult  Frequency Min 5X/week   Progress towards PT Goals Progress towards PT goals: Progressing toward goals  Plan Current plan remains appropriate    Precautions / Restrictions Precautions Precautions: Fall Restrictions Weight Bearing Restrictions: No   Pertinent Vitals/Pain VSS, 10/10 pain scapula and right hip    Mobility  Bed Mobility Overal bed mobility: Needs Assistance Bed Mobility: Supine to Sit Supine to sit: Mod assist;+2 for physical assistance General bed mobility comments: Able to roll without assist however needed assist as right UE would not push pt up from bed.   Transfers Overall transfer level: Needs assistance Equipment used: Rolling walker (2 wheeled) Transfers: Sit to/from Stand Sit to Stand: +2 physical assistance;Max assist General transfer comment: Pt at times needing mod assist to stand from low surface however as she fatigued, she had greater difficulty getting up  from recliner. Pt stuggled to get her body forward to stand up.  Took several attempts at times.   Ambulation/Gait Ambulation/Gait assistance: Mod assist;+2 physical assistance Ambulation Distance (Feet): 135 Feet (20 feet then 10 feet then 40 feet then 65 feet) Assistive device: Rolling walker (2 wheeled) Gait Pattern/deviations: Step-to pattern;Decreased step length - right;Decreased stance time - right;Decreased dorsiflexion - right;Decreased dorsiflexion - left;Trunk flexed;Wide base of support Gait velocity interpretation: Below normal speed for age/gender General Gait Details: Pt not impulsive today but sluggish.  Pt with flexed trunk, hips and knees.  Pt having incr difficulty with postural stability.  Fatiguing much more rapidly than last session with poorer postural stability.  Needed mod assist as well as tactile and verbal cuing for posture as well as mod cues for sequencing steps and steering RW.  Needed several seated rest breaks as she fatigues, her safety worsens.      Exercises General Exercises - Lower Extremity Ankle Circles/Pumps: AROM;Both;10 reps;Seated Long Arc Quad: AROM;Both;10 reps;Seated Hip Flexion/Marching: AAROM;Both;5 reps;Seated   PT Diagnosis:    PT Problem List:   PT Treatment Interventions:     PT Goals (current goals can now be found in the care plan section)    Visit Information  Last PT Received On: 07/19/13 Assistance Needed: +2 (for sit<>stand and chair follow) PT/OT/SLP Co-Evaluation/Treatment: Yes Reason for Co-Treatment: Complexity of the patient's impairments (multi-system involvement) PT goals addressed during session: Mobility/safety with mobility History of Present Illness: 79 yo female admitted with spinal epidural abscess undergoing ACDF.      Subjective Data  Subjective: "I feel weaker and tired."  Cognition  Cognition Arousal/Alertness: Awake/alert Behavior During Therapy: Impulsive;Restless Overall Cognitive Status:  Impaired/Different from baseline Area of Impairment: Following commands;Safety/judgement;Awareness;Problem solving;Attention Current Attention Level: Focused Following Commands: Follows one step commands consistently Safety/Judgement: Decreased awareness of safety;Decreased awareness of deficits Awareness: Emergent Problem Solving: Difficulty sequencing;Requires verbal cues;Requires tactile cues General Comments: Pt oriented today.      Balance  Balance Overall balance assessment: Needs assistance;History of Falls Standing balance support: Bilateral upper extremity supported;During functional activity Standing balance-Leahy Scale: Poor Standing balance comment: Pt stood at sink to brush teeth with great difficulty today to maintain upright posture.  Pt was flexed at hips, knees and trunk requiring max assist for stability by end of stand.  Pt needs cues to know when she needs a break with pt leaning onto sink to brush her teeth with flexed back and hips.  Pt seemed to need bil UE support throughout due to poorer postural stability today.    End of Session PT - End of Session Equipment Utilized During Treatment: Gait belt Activity Tolerance: Patient limited by fatigue Patient left: in chair;with call bell/phone within reach;with family/visitor present;with chair alarm set Nurse Communication: Mobility status   GP     [name] 07/19/2013, 4:27 PM [facility] [phone] (pager)

## 2013-07-19 NOTE — Progress Notes (Signed)
Orthopedic Tech Progress Note Patient Details:  [name] 1934-12-18 [id number] Soft collar applied Ortho Devices Type of Ortho Device: Soft collar Ortho Device/Splint Interventions: Application   Asia R Thompson 07/19/2013, 9:38 AM

## 2013-07-19 NOTE — Consult Note (Signed)
Physical Medicine and Rehabilitation Consult Reason for Consult: Epidural abscess/quadriparesis Referring Physician: Dr. Vertell Limber   HPI: [name] is a 79 y.o. right-handed female with history of hypertension, COPD. Admitted 07/16/2013 with progressive quadriparesis x3 days. Denied any bowel or bladder disturbances. Patient with low-grade fever 102.2 white blood cell count of 14,000. MRI and imaging revealed cervical epidural abscess. Underwent emergent anterior cervical decompression discectomy with fusion multilevel C4-C7 07/16/2013 per Dr. Vertell Limber. Followup infectious disease with spinal abscess fluid growing and microaerophilic streptococci and maintained on ceftriaxone as well as vancomycin. Postoperative pain management. Physical and occupational therapy evaluations completed 07/17/2013 with recommendations for physical medicine rehabilitation consult.   Review of Systems  Respiratory: Positive for shortness of breath.   Gastrointestinal: Positive for constipation.  Musculoskeletal: Positive for joint pain and myalgias.  Neurological: Positive for weakness.  All other systems reviewed and are negative.   Past Medical History  Diagnosis Date  . Hypertension   . Asthma   . Cancer     Skin Cancer  . Sleep apnea    Past Surgical History  Procedure Laterality Date  . Gastric bypass     History reviewed. No pertinent family history. Social History:  reports that she has never smoked. She does not have any smokeless tobacco history on file. She reports that she does not drink alcohol. Her drug history is not on file. Allergies:  Allergies  Allergen Reactions  . Codeine Nausea Only  . Iohexol Itching    07-15-13 pt developed itching on fingers after contrast given. Dr. Irish Elders looked at pt and said to put in system as allergy. BB  . Synvisc [Hylan G-F 20]    Medications Prior to Admission  Medication Sig Dispense Refill  . acetaminophen (TYLENOL) 650 MG CR tablet Take  1,300 mg by mouth every 8 (eight) hours as needed for pain.       Marland Kitchen aspirin 81 MG tablet Take 81 mg by mouth daily.        . calcium carbonate (OS-CAL - DOSED IN MG OF ELEMENTAL CALCIUM) 1250 MG tablet Take 2 tablets by mouth daily.       . Cholecalciferol (VITAMIN D) 2000 UNITS CAPS Take 2,000 Units by mouth daily.      . cyanocobalamin 500 MCG tablet Take 500 mcg by mouth daily.        . ferrous fumarate (HEMOCYTE - 106 MG FE) 325 (106 FE) MG TABS Take 1 tablet by mouth 3 (three) times a week.       . fluticasone (FLONASE) 50 MCG/ACT nasal spray Place 2 sprays into both nostrils daily.      . Fluticasone-Salmeterol (ADVAIR) 250-50 MCG/DOSE AEPB Inhale 1 puff into the lungs 2 (two) times daily.      . furosemide (LASIX) 20 MG tablet Take 20 mg by mouth daily.      Marland Kitchen HYDROcodone-acetaminophen (NORCO/VICODIN) 5-325 MG per tablet Take 1-2 tablets by mouth every 4 (four) hours as needed for moderate pain.       Marland Kitchen ibuprofen (ADVIL,MOTRIN) 200 MG tablet Take 400 mg by mouth every 6 (six) hours as needed.      Marland Kitchen lisinopril (PRINIVIL,ZESTRIL) 5 MG tablet Take 5 mg by mouth daily.       Marland Kitchen loratadine (CLARITIN) 10 MG tablet Take 10 mg by mouth daily.      . Multiple Vitamins-Minerals (MULTIVITAMIN WITH MINERALS) tablet Take 1 tablet by mouth daily.        Marland Kitchen  naproxen sodium (ANAPROX) 220 MG tablet Take 220 mg by mouth 2 (two) times daily with a meal.      . oxybutynin (DITROPAN) 5 MG tablet Take 2.5 mg by mouth 2 (two) times daily.       . predniSONE (DELTASONE) 10 MG tablet Take 40 mg by mouth daily. Pt taking 40 mg a day for 3 days, then 30 mg a day for 3 days, 20 mg a day for 3 days, 10 mg a day for 3 days      . PREVNAR 13 SUSP injection Inject 0.5 mLs into the muscle once.       . traZODone (DESYREL) 50 MG tablet Take 50 mg by mouth at bedtime.       . clotrimazole-betamethasone (LOTRISONE) cream         Home: Home Living Family/patient expects to be discharged to:: Private residence Living  Arrangements: Alone Available Help at Discharge: Family;Available 24 hours/day (maybe sister for a few weeks per pt) Type of Home: House Home Access: Stairs to enter [employer] of Steps: 3 Entrance Stairs-Rails: None Home Layout: One level Home Equipment: Shower seat;Tub bench;Walker - 2 wheels;Walker - 4 wheels;Transport chair;Bedside commode;Grab bars - tub/shower;Grab bars - toilet;Hand held shower head;Cane - single point (base for recliner) Additional Comments: baseline patient uses no DME but has DME from bil knee surg in the past  Functional History:   Functional Status:  Mobility:     Ambulation/Gait Ambulation Distance (Feet): 96 Feet (10 feet then 46 feet and then 40 feet) General Gait Details: Pt impulsive with movements decreasing her safety.  Pt with right knee hyperextension throughout gait.  Noted pt seemed to have left knee hyperextension as well with fatigue as well as flexed posture with fatigue as well.  Pt drifts left when ambulating. Needed mod assist at times for tactile cuing for posture as well as mod cues for sequencing steps and steering RW.  Needed several seated rest breaks as well due to as she fatigues, safety worsens.  Desat to 87-90 % on RA therefore ambulated pt on 2L with O2 94% and >.      ADL: ADL Eating/Feeding: NPO Grooming: Wash/dry face;Teeth care;Minimal assistance (unable to open containers) Where Assessed - Grooming: Supported standing Upper Body Bathing: Chest;Right arm;Left arm;Abdomen;Simulated;Moderate assistance Where Assessed - Upper Body Bathing: Supported sitting Lower Body Dressing: Maximal assistance Where Assessed - Lower Body Dressing: Supported sit to Lobbyist: +2 Total assistance;Maximal assistance Toilet Transfer Method: Sit to Loss adjuster, chartered: Raised toilet seat with arms (or 3-in-1 over toilet) Equipment Used: Gait belt;Rolling walker;Other (comment) (oxygen tank) Transfers/Ambulation  Related to ADLs: pt requires ambulation with oxygen at this time. pt decr oxygen saturations in chair talking to 88%. pt unaware of decr oxygen . Pt needs cues for pulsed lip breathing to rebound at times. pt static standing at sink for adls with flexed posture and knee flexion due to bil Le weakness. pt with bil knee flexion increasing with fatigue and slowly descending toward floor with pt unaware. pt needed cues to correct posture and return to static standing upright posture. pt requires rest break at sink with grooming due to bil le weakness. Pt ambulating short distance and asked to return to sitting due to lack awareness to bil LE weakness. Pt motivated and attempting to progress further than what is safe currently. Pt is curently at fall risk for reporting ability to progress further than what is safe. ADL Comments: pt reports waking  this am unaware of current surroundings. pt reports after 3 or 4 attempts exiting the bed alone over the bed rail. pt currently positioned in recliner. Pt needs physical (A) of 2 for sit<>stand. Pt hyper extending Rt knee with ambulation to progress. Pt pleasant and cheerful at this time. Pt static standing at sink for grooming but able to tolerate due to fatigue and decr oxygen  Cognition: Cognition Overall Cognitive Status: Impaired/Different from baseline Orientation Level: Oriented X4 Cognition Arousal/Alertness: Awake/alert Behavior During Therapy: Impulsive;Restless Overall Cognitive Status: Impaired/Different from baseline Area of Impairment: Orientation;Following commands;Safety/judgement;Awareness;Problem solving;Attention Orientation Level: Disoriented to;Situation;Place (Woke up and did not know where she was and got up.) Current Attention Level: Focused Following Commands: Follows one step commands consistently Safety/Judgement: Decreased awareness of safety;Decreased awareness of deficits Awareness: Emergent;Anticipatory Problem Solving: Difficulty  sequencing;Requires verbal cues;Requires tactile cues General Comments: Pt with intermittent confusion.  Got up by herself earlier today.  At present, pt is oriented during the session. Pt needs cues for safety. Pt unaware of deficits fully.    Blood pressure 165/71, pulse 74, temperature 98.7 F (37.1 C), temperature source Oral, resp. rate 16, height 5\' 3"  (1.6 m), weight 119.5 kg (263 lb 7.2 oz), SpO2 95.00%. Physical Exam  Vitals reviewed. Constitutional:  79 year old obese female  HENT:  Head: Normocephalic.  Eyes: EOM are normal.  Neck: Normal range of motion. Neck supple. No thyromegaly present.  Cardiovascular: Normal rate and regular rhythm.   Respiratory: Effort normal and breath sounds normal. No respiratory distress.  GI: Soft. Bowel sounds are normal. She exhibits no distension.  Neurological:  Patient is alert and anxious during exam. She is oriented x3 and followed basic commands. RUE is 3- deltoid, bicep, tricep, 2+ wrist and HI, LUE is 3/5 deltoid, bicep, tricep, HI/wrist. LE's 2 to 3/5 HF, 3+ to 4 ke, 4/5 at ankles. Sensation 1-/2 RUE, 1/2 LUE, 1+/2 bilateral LE's. LE's hyperreflexive.   Skin:  Surgical site clean and dry  Psychiatric: She has a normal mood and affect. Her behavior is normal. Judgment and thought content normal.    No results found for this or any previous visit (from the past 24 hour(s)). No results found.  Assessment/Plan: Diagnosis: cervical epidural abscess with tetraplegia s/p decompression and fusion 1. Does the need for close, 24 hr/day medical supervision in concert with the patient's rehab needs make it unreasonable for this patient to be served in a less intensive setting? Yes 2. Co-Morbidities requiring supervision/potential complications: pain, OSA, morbid obesity 3. Due to bladder management, bowel management, safety, skin/wound care, disease management, medication administration, pain management and patient education, does the patient  require 24 hr/day rehab nursing? Yes 4. Does the patient require coordinated care of a physician, rehab nurse, PT (1-2 hrs/day, 5 days/week) and OT (1-2 hrs/day, 5 days/week) to address physical and functional deficits in the context of the above medical diagnosis(es)? Yes Addressing deficits in the following areas: balance, endurance, locomotion, strength, transferring, bowel/bladder control, bathing, dressing, feeding, grooming, toileting and psychosocial support 5. Can the patient actively participate in an intensive therapy program of at least 3 hrs of therapy per day at least 5 days per week? Yes 6. The potential for patient to make measurable gains while on inpatient rehab is excellent 7. Anticipated functional outcomes upon discharge from inpatient rehab are supervision  with PT, supervision to min assist with OT, n/a with SLP. 8. Estimated rehab length of stay to reach the above functional goals is: 15-23 days 9. Does the patient  have adequate social supports to accommodate these discharge functional goals? Yes 10. Anticipated D/C setting: Home 11. Anticipated post D/C treatments: HH therapy to outpt therapies 12. Overall Rehab/Functional Prognosis: excellent  RECOMMENDATIONS: This patient's condition is appropriate for continued rehabilitative care in the following setting: CIR Patient has agreed to participate in recommended program. Yes Note that insurance prior authorization may be required for reimbursement for recommended care.  Comment: Rehab Admissions Coordinator to follow up. Pain is a major problem at present. The majority of it appears to be neuropathic.  Thanks,  Meredith Staggers, MD, Mellody Drown     07/19/2013

## 2013-07-19 NOTE — Progress Notes (Signed)
Pt has home cpap and places self on/off.  RT refilled water chamber and made pt aware she could call rt if needed.

## 2013-07-19 NOTE — Progress Notes (Signed)
Pt states pain is much improved since this morning.  However, she does complain of frequency with urination.  Denies pain or burning.  PVR was 93.  Will continue to monitor.

## 2013-07-19 NOTE — Progress Notes (Signed)
Will try brief course of decadron and start gabapentin.  Motor function continuing to improve.

## 2013-07-19 NOTE — Progress Notes (Signed)
Occupational Therapy Treatment Note  Pt seen for second treatment and provided with AE to increase independence with self feeding.  She was also instructed in HEP to increase bil. Hand strength.    07/19/13 1719  OT Visit Information  Last OT Received On 07/19/13  Assistance Needed +2  History of Present Illness 79 yo female admitted with spinal epidural abscess undergoing ACDF.    OT Time Calculation  OT Start Time 1540  OT Stop Time 1553  OT Time Calculation (min) 13 min  Precautions  Precautions Fall  ADL  Eating/Feeding Set up (with AE)  Where Assessed - Eating/Feeding Chair;Bed level  ADL Comments Pt provided with lidded mug and built up handled, curved utensils for self feeding .  She is able to drink from lidded mug mod I  Cognition  Arousal/Alertness Awake/alert  Behavior During Therapy Impulsive;Restless  Overall Cognitive Status Impaired/Different from baseline  Area of Impairment Following commands;Safety/judgement;Awareness;Problem solving;Attention  Current Attention Level Sustained  Memory Decreased recall of precautions  Following Commands Follows one step commands consistently  Safety/Judgement Decreased awareness of safety;Decreased awareness of deficits  Awareness Emergent  Problem Solving Difficulty sequencing;Requires verbal cues;Requires tactile cues  Exercises  Exercises Other exercises  Other Exercises  Other Exercises Pt instructed to pull washcloth with both hands and to wring washcloth.  She was also provided with foam cylinder and instructed to pinch and grip cylinder to increase strength  OT - End of Session  Activity Tolerance Patient tolerated treatment well  Patient left in chair;with call bell/phone within reach;with family/visitor present  OT Assessment/Plan  OT Plan Discharge plan remains appropriate  OT Frequency Min 3X/week  Recommendations for Other Services Rehab consult  Follow Up Recommendations CIR  OT Equipment None recommended by  OT  OT Goal Progression  Progress towards OT goals Progressing toward goals  ADL Goals  Pt Will Perform Grooming with min assist;sitting;standing  Pt Will Perform Upper Body Bathing with min assist;sitting  Pt Will Perform Lower Body Bathing sit to/from stand;with adaptive equipment;with mod assist  Pt Will Transfer to Toilet with mod assist;bedside commode  Additional ADL Goal #1 Pt will complete bed mobility Min (A) without bed rails and hob ~20 degrees or less  Additional ADL Goal #2 Pt will return demonstrates BIL UE HEP Supervision level with theraputty  OT General Charges  $OT Visit 1 Procedure  OT Treatments  $Self Care/Home Management  8-22 mins  [employer], OTR/L [phone]

## 2013-07-19 NOTE — Progress Notes (Signed)
Patient ID: [name], female   DOB: [birth year], 79 y.o.   MRN: [id number]         [facility]    Date of Admission:  07/15/2013           Day 4 antibiotics         Principal Problem:   Spinal epidural abscess Active Problems:   Acute respiratory failure   OSA on CPAP   . aspirin  81 mg Oral Daily  . calcium carbonate  2 tablet Oral Q breakfast  . cefTRIAXone (ROCEPHIN)  IV  2 g Intravenous Q12H  . cholecalciferol  2,000 Units Oral Daily  . cyanocobalamin  500 mcg Oral Daily  . dexamethasone  4 mg Oral 3 times per day  . docusate sodium  100 mg Oral BID  . ferrous fumarate  1 tablet Oral Once per day on Mon Wed Fri  . fluticasone  2 spray Each Nare Daily  . furosemide  20 mg Oral Daily  . gabapentin  300 mg Oral TID  . lisinopril  5 mg Oral Daily  . loratadine  10 mg Oral Daily  . mometasone-formoterol  2 puff Inhalation BID  . multivitamin with minerals  1 tablet Oral Daily  . oxybutynin  2.5 mg Oral BID  . senna  1 tablet Oral BID  . traZODone  50 mg Oral QHS  . vancomycin  1,500 mg Intravenous Once   Objective: Temp:  [98 F (36.7 C)-98.7 F (37.1 C)] 98.3 F (36.8 C) (03/09 0930) Pulse Rate:  [73-92] 75 (03/09 0930) Resp:  [16-18] 18 (03/09 0930) BP: (148-195)/(71-86) 164/86 mmHg (03/09 0930) SpO2:  [94 %-96 %] 96 % (03/09 0930)  Lab Results Lab Results  Component Value Date   WBC 12.0* 07/17/2013   HGB 11.8* 07/17/2013   HCT 35.2* 07/17/2013   MCV 95.4 07/17/2013   PLT 189 07/17/2013    Lab Results  Component Value Date   CREATININE 0.41* 07/17/2013   BUN 13 07/17/2013   NA 143 07/17/2013   K 4.4 07/17/2013   CL 100 07/17/2013   CO2 28 07/17/2013    Microbiology: Recent Results (from the past 240 hour(s))  CULTURE, ROUTINE-ABSCESS     Status: None   Collection Time    07/16/13  4:15 AM      Result Value Ref Range Status   Specimen Description ABSCESS LEFT NECK   Final   Special Requests NONE   Final   Gram Stain     Final   Value: ABUNDANT WBC PRESENT,BOTH PMN AND MONONUCLEAR     NO SQUAMOUS EPITHELIAL CELLS SEEN     ABUNDANT GRAM POSITIVE COCCI     IN PAIRS     Performed at [facility]   Culture     Final   Value: ABUNDANT MICROAEROPHILIC STREPTOCOCCI     Note: Standardized susceptibility testing for this organism is not available.     Performed at [facility]   Report Status 07/19/2013 FINAL   Final  ANAEROBIC CULTURE     Status: None   Collection Time    07/16/13  4:15 AM      Result Value Ref Range Status   Specimen Description ABSCESS LEFT NECK   Final   Special Requests NONE   Final   Gram Stain PENDING   Incomplete   Culture     Final   Value: NO ANAEROBES ISOLATED; CULTURE IN PROGRESS FOR 5 DAYS  Performed at [facility]   Report Status PENDING   Incomplete  CULTURE, ROUTINE-ABSCESS     Status: None   Collection Time    07/16/13  4:19 AM      Result Value Ref Range Status   Specimen Description ABSCESS LEFT NECK   Final   Special Requests NONE   Final   Gram Stain     Final   Value: ABUNDANT WBC PRESENT,BOTH PMN AND MONONUCLEAR     NO SQUAMOUS EPITHELIAL CELLS SEEN     ABUNDANT GRAM POSITIVE COCCI     IN PAIRS     Performed at [facility]   Culture     Final   Value: MODERATE MICROAEROPHILIC STREPTOCOCCI     Note: Standardized susceptibility testing for this organism is not available.     Performed at [facility]   Report Status 07/19/2013 FINAL   Final  ANAEROBIC CULTURE     Status: None   Collection Time    07/16/13  4:19 AM      Result Value Ref Range Status   Specimen Description ABSCESS LEFT NECK   Final   Special Requests NONE   Final   Gram Stain     Final   Value: ABUNDANT WBC PRESENT,BOTH PMN AND MONONUCLEAR     NO SQUAMOUS EPITHELIAL CELLS SEEN     ABUNDANT GRAM POSITIVE COCCI     IN PAIRS     Performed at [facility]   Culture     Final   Value: NO ANAEROBES ISOLATED; CULTURE IN PROGRESS FOR 5 DAYS      Performed at [facility]   Report Status PENDING   Incomplete  TISSUE CULTURE     Status: None   Collection Time    07/16/13  4:24 AM      Result Value Ref Range Status   Specimen Description TISSUE LEFT NECK   Final   Special Requests NO 3 IN CUP   Final   Gram Stain     Final   Value: RARE WBC PRESENT, PREDOMINANTLY PMN     NO ORGANISMS SEEN     Performed at [facility]   Culture     Final   Value: ABUNDANT MICROAEROPHILIC STREPTOCOCCI     Note: Standardized susceptibility testing for this organism is not available.     Performed at [facility]   Report Status 07/19/2013 FINAL   Final  MRSA PCR SCREENING     Status: None   Collection Time    07/16/13  5:33 AM      Result Value Ref Range Status   MRSA by PCR NEGATIVE  NEGATIVE Final   Comment:            The GeneXpert MRSA Assay (FDA     approved for NASAL specimens     only), is one component of a     comprehensive MRSA colonization     surveillance program. It is not     intended to diagnose MRSA     infection nor to guide or     monitor treatment for     MRSA infections.   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  Plan: 1. Continue ceftriaxone 2. She will need PICC placement and at least 6 weeks of antibiotic therapy. 3. I will follow up on Wednesday, March 11  Michel Bickers, MD [facility] [phone]  pager   [phone] cell 07/19/2013, 1:57 PM

## 2013-07-20 LAB — ANAEROBIC CULTURE

## 2013-07-20 NOTE — Progress Notes (Signed)
Rehab admissions - Evaluated for possible admission.  I have spoken with patient and have given her rehab booklets.  I have called and opened the case with [employer] carrier.  I will await call back from insurance case manager regarding possible acute inpatient rehab admission.  Call me for questions.  #[phone]

## 2013-07-20 NOTE — Progress Notes (Signed)
Subjective: Patient reports "I think I'm doing real good!"  Objective: Vital signs in last 24 hours: Temp:  [97.7 F (36.5 C)-98.7 F (37.1 C)] 98.5 F (36.9 C) (03/10 0736) Pulse Rate:  [74-96] 77 (03/10 1044) Resp:  [18] 18 (03/10 0736) BP: (146-192)/(63-82) 151/73 mmHg (03/10 1044) SpO2:  [92 %-98 %] 98 % (03/10 0939)  Intake/Output from previous day: 03/09 0701 - 03/10 0700 In: 240 [P.O.:240] Out: 359 [Urine:359] Intake/Output this shift: Total I/O In: 240 [P.O.:240] Out: 200 [Urine:200]  Alert, sitting in chair eating meal.  Happily reports no pain. Incision without erythema, swelling, or drainage. Improving strength, but significant weakness BUE persists.   Lab Results: No results found for this basename: WBC, HGB, HCT, PLT,  in the last 72 hours BMET No results found for this basename: NA, K, CL, CO2, GLUCOSE, BUN, CREATININE, CALCIUM,  in the last 72 hours  Studies/Results: No results found.  Assessment/Plan: Improving   LOS: 5 days  Awaiting insurance approval for CIR   Verdis Prime 07/20/2013, 12:44 PM

## 2013-07-20 NOTE — Progress Notes (Signed)
Patient ID: [name], female   DOB: 1934/12/18, 79 y.o.   MRN: [id number]         [facility] for Infectious Disease    Date of Admission:  07/15/2013           Day 5 antibiotics         Principal Problem:   Spinal epidural abscess Active Problems:   Acute respiratory failure   OSA on CPAP   . aspirin  81 mg Oral Daily  . calcium carbonate  2 tablet Oral Q breakfast  . cefTRIAXone (ROCEPHIN)  IV  2 g Intravenous Q12H  . cholecalciferol  2,000 Units Oral Daily  . cyanocobalamin  500 mcg Oral Daily  . docusate sodium  100 mg Oral BID  . ferrous fumarate  1 tablet Oral Once per day on Mon Wed Fri  . fluticasone  2 spray Each Nare Daily  . furosemide  20 mg Oral Daily  . gabapentin  300 mg Oral TID  . lisinopril  5 mg Oral Daily  . loratadine  10 mg Oral Daily  . mometasone-formoterol  2 puff Inhalation BID  . multivitamin with minerals  1 tablet Oral Daily  . oxybutynin  2.5 mg Oral BID  . senna  1 tablet Oral BID  . traZODone  50 mg Oral QHS   Subjective:She is feeling a little bit better. Her pain is under better control.   Objective: Temp:  [97.7 F (36.5 C)-98.7 F (37.1 C)] 98.6 F (37 C) (03/10 1416) Pulse Rate:  [74-96] 83 (03/10 1416) Resp:  [18] 18 (03/10 1416) BP: (146-192)/(72-82) 154/73 mmHg (03/10 1416) SpO2:  [92 %-98 %] 96 % (03/10 1416)  She is alert and in no distress  Lab Results Lab Results  Component Value Date   WBC 12.0* 07/17/2013   HGB 11.8* 07/17/2013   HCT 35.2* 07/17/2013   MCV 95.4 07/17/2013   PLT 189 07/17/2013    Lab Results  Component Value Date   CREATININE 0.41* 07/17/2013   BUN 13 07/17/2013   NA 143 07/17/2013   K 4.4 07/17/2013   CL 100 07/17/2013   CO2 28 07/17/2013    Microbiology: Recent Results (from the past 240 hour(s))  CULTURE, ROUTINE-ABSCESS     Status: None   Collection Time    07/16/13  4:15 AM      Result Value Ref Range Status   Specimen Description ABSCESS LEFT NECK   Final   Special Requests NONE   Final    Gram Stain     Final   Value: ABUNDANT WBC PRESENT,BOTH PMN AND MONONUCLEAR     NO SQUAMOUS EPITHELIAL CELLS SEEN     ABUNDANT GRAM POSITIVE COCCI     IN PAIRS     Performed at [facility]   Culture     Final   Value: ABUNDANT MICROAEROPHILIC STREPTOCOCCI     Note: Standardized susceptibility testing for this organism is not available.     Performed at [facility]   Report Status 07/19/2013 FINAL   Final  ANAEROBIC CULTURE     Status: None   Collection Time    07/16/13  4:15 AM      Result Value Ref Range Status   Specimen Description ABSCESS LEFT NECK   Final   Special Requests NONE   Final   Gram Stain     Final   Value: ABUNDANT WBC PRESENT,BOTH PMN AND MONONUCLEAR     NO SQUAMOUS  EPITHELIAL CELLS SEEN     ABUNDANT GRAM POSITIVE COCCI     IN PAIRS     Performed at [facility]   Culture     Final   Value: NO ANAEROBES ISOLATED     Performed at [facility]   Report Status 07/20/2013 FINAL   Final  CULTURE, ROUTINE-ABSCESS     Status: None   Collection Time    07/16/13  4:19 AM      Result Value Ref Range Status   Specimen Description ABSCESS LEFT NECK   Final   Special Requests NONE   Final   Gram Stain     Final   Value: ABUNDANT WBC PRESENT,BOTH PMN AND MONONUCLEAR     NO SQUAMOUS EPITHELIAL CELLS SEEN     ABUNDANT GRAM POSITIVE COCCI     IN PAIRS     Performed at [facility]   Culture     Final   Value: MODERATE MICROAEROPHILIC STREPTOCOCCI     Note: Standardized susceptibility testing for this organism is not available.     Performed at [facility]   Report Status 07/19/2013 FINAL   Final  ANAEROBIC CULTURE     Status: None   Collection Time    07/16/13  4:19 AM      Result Value Ref Range Status   Specimen Description ABSCESS LEFT NECK   Final   Special Requests NONE   Final   Gram Stain     Final   Value: ABUNDANT WBC PRESENT,BOTH PMN AND MONONUCLEAR     NO SQUAMOUS EPITHELIAL CELLS SEEN     ABUNDANT  GRAM POSITIVE COCCI     IN PAIRS     Performed at [facility]   Culture     Final   Value: NO ANAEROBES ISOLATED     Performed at [facility]   Report Status 07/20/2013 FINAL   Final  TISSUE CULTURE     Status: None   Collection Time    07/16/13  4:24 AM      Result Value Ref Range Status   Specimen Description TISSUE LEFT NECK   Final   Special Requests NO 3 IN CUP   Final   Gram Stain     Final   Value: RARE WBC PRESENT, PREDOMINANTLY PMN     NO ORGANISMS SEEN     Performed at [facility]   Culture     Final   Value: ABUNDANT MICROAEROPHILIC STREPTOCOCCI     Note: Standardized susceptibility testing for this organism is not available.     Performed at [facility]   Report Status 07/19/2013 FINAL   Final  MRSA PCR SCREENING     Status: None   Collection Time    07/16/13  5:33 AM      Result Value Ref Range Status   MRSA by PCR NEGATIVE  NEGATIVE Final   Comment:            The GeneXpert MRSA Assay (FDA     approved for NASAL specimens     only), is one component of a     comprehensive MRSA colonization     surveillance program. It is not     intended to diagnose MRSA     infection nor to guide or     monitor treatment for     MRSA infections.   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  Plan:  1. Continue ceftriaxone 2. She will need PICC placement and at least 6 weeks of antibiotic therapy through April 16 3. We will follow up later this week  Michel Bickers, MD [facility] [place] [phone] pager   [phone] cell 07/20/2013, 4:20 PM

## 2013-07-20 NOTE — Progress Notes (Addendum)
Physical Therapy Treatment Patient Details Name: [name] MRN: [id number] DOB: March 17, 1935 Today's Date: 07/20/2013 Time: 6644-0347 PT Time Calculation (min): 31 min  PT Assessment / Plan / Recommendation  History of Present Illness 79 yo female admitted with spinal epidural abscess undergoing ACDF.     PT Comments   Pt admitted with above. Pt currently with functional limitations due to balance and endurance deficits. Pt ready for Rehab.  Hopeful that there will be a bed available today.  Pt will benefit from skilled PT to increase their independence and safety with mobility to allow discharge to the venue listed below.   Follow Up Recommendations  CIR;Supervision/Assistance - 24 hour     Does the patient have the potential to tolerate intense rehabilitation     Barriers to Discharge        Equipment Recommendations  Other (comment) (TBA)    Recommendations for Other Services Rehab consult  Frequency Min 5X/week   Progress towards PT Goals Progress towards PT goals: Progressing toward goals  Plan Current plan remains appropriate    Precautions / Restrictions Precautions Precautions: Fall Restrictions Weight Bearing Restrictions: No   Pertinent Vitals/Pain VSS, Some pain but better than yesterday per pt.  Bil hip pain and scapular pain but not rated by pt. Had pain meds.    Mobility  Bed Mobility Overal bed mobility: Needs Assistance Bed Mobility: Sit to Sidelying;Rolling Rolling: Min assist Sit to sidelying: Mod assist General bed mobility comments: Pt needed mod assist to bring LEs into bed to lie down and rolled onto back with min assist.  Assist to scoot up in bed.   Transfers Overall transfer level: Needs assistance Equipment used: Rolling walker (2 wheeled) Transfers: Sit to/from Stand Sit to Stand: Mod assist;+2 physical assistance General transfer comment: Still needing mod assist to stand.  Pt struggles to lean anteriorly enough to stand up.  Needs cues  and assist physically.  Takes several attempts.   Ambulation/Gait Ambulation/Gait assistance: Mod assist;+2 safety/equipment Ambulation Distance (Feet): 100 Feet Assistive device: Rolling walker (2 wheeled) Gait Pattern/deviations: Step-to pattern;Decreased stride length;Decreased step length - right;Decreased stance time - right;Decreased dorsiflexion - right;Decreased dorsiflexion - left;Wide base of support;Trunk flexed Gait velocity interpretation: Below normal speed for age/gender General Gait Details: Pt with slight impulsiviity at times especially when she fatigues.  Pt not as fatigued to start with today therefore she had incr upright posture with less flexion of hips, knees and trunk and could maintain close to full upright posture with verbal and tactile cues for most of the walk.  Still needs mod cues for sequencing steps and steering RW.  Note that pt seen at 0900 am today and did much better.  Feel that pt declines as the day progresses due to fatigue and decr endurance.  Also pt was in alot of pain yesterday and today pain was more controlled so pain control will be key in pts recovery as well.  Pt much more encouraged after today's session.        Exercises General Exercises - Lower Extremity Ankle Circles/Pumps: AROM;Both;10 reps;Seated Long Arc Quad: AROM;Both;10 reps;Seated Straight Leg Raises: AROM;Both;10 reps;Supine Hip Flexion/Marching: AAROM;Both;5 reps;Seated   PT Diagnosis:    PT Problem List:   PT Treatment Interventions:     PT Goals (current goals can now be found in the care plan section)    Visit Information  Last PT Received On: 07/20/13 Assistance Needed: +2 History of Present Illness: 79 yo female admitted with spinal epidural  abscess undergoing ACDF.      Subjective Data  Subjective: "I had a great night."   Cognition  Cognition Arousal/Alertness: Awake/alert Behavior During Therapy: Anxious Overall Cognitive Status: Impaired/Different from  baseline Area of Impairment: Following commands;Safety/judgement;Awareness;Problem solving;Attention Current Attention Level: Sustained Memory: Decreased recall of precautions Following Commands: Follows one step commands consistently Safety/Judgement: Decreased awareness of safety;Decreased awareness of deficits Awareness: Emergent Problem Solving: Difficulty sequencing;Requires verbal cues;Requires tactile cues General Comments: Continues to be oriented.  Safety improving however still an issue as pt fatigues.      Balance  Balance Overall balance assessment: Needs assistance;History of Falls Sitting-balance support: Bilateral upper extremity supported;Feet supported Sitting balance-Leahy Scale: Fair Sitting balance - Comments: Pt performed LE exercises sitting at EOB.  Needed UE support for balance.   Standing balance support: Bilateral upper extremity supported;During functional activity Standing balance-Leahy Scale: Poor Standing balance comment: Pt needs cues for full upright posture however did better today with ability to maintain postural stability.    End of Session PT - End of Session Equipment Utilized During Treatment: Gait belt Activity Tolerance: Patient limited by fatigue Patient left: with call bell/phone within reach;in bed;with bed alarm set Nurse Communication: Mobility status   GP     INGOLD,Donovyn Guidice 07/20/2013, 9:50 AM [facility] [phone] (pager)

## 2013-07-20 NOTE — Progress Notes (Signed)
IV team notified of new order for PICC placement. Patient added to the PICC list per IV team RN and to be inserted tomorrow 3/11. [name], [name] 07/20/2013  6:34 PM

## 2013-07-20 NOTE — Progress Notes (Signed)
Occupational Therapy Treatment Patient Details Name: ODILIA DAMICO MRN: [id number] DOB: February 17, 1935 Today's Date: 07/20/2013 Time: 0630-1601 OT Time Calculation (min): 20 min  OT Assessment / Plan / Recommendation  History of present illness 79 yo female admitted with spinal epidural abscess undergoing ACDF.     OT comments  Pt performed grooming at sink and hand exercises. Pt progressing towards goals.   Follow Up Recommendations  CIR    Barriers to Discharge       Equipment Recommendations  None recommended by OT    Recommendations for Other Services Rehab consult  Frequency Min 3X/week   Progress towards OT Goals Progress towards OT goals: Progressing toward goals  Plan Discharge plan remains appropriate    Precautions / Restrictions Precautions Precautions: Fall Restrictions Weight Bearing Restrictions: No   Pertinent Vitals/Pain Pain 2-3/10 in back. Pt appeared comfortable in bed at end of session.     ADL  Grooming: Teeth care;Brushing hair;Minimal assistance;Other (comment) (shaving) Where Assessed - Grooming: Supported standing;Supported sitting Toilet Transfer: Minimal assistance;+2 Total assistance Toilet Transfer Method: Sit to stand;Stand pivot Science writer: Other (comment) (bed <> chair) Equipment Used: Gait belt;Rolling walker;Other (comment) (built up tubing for exercises) Transfers/Ambulation Related to ADLs: Min A (+2 for safety) for sit to stand transfers. Min guard for stand pivot. +2 Total A (safety)/Min guard for ambulation (short distance). Stated knees felt like they were giving out and feet felt like bricks. ADL Comments: Performed grooming at sink-several cues for precautions. Performed hand exercises sitting in chair.    OT Diagnosis:    OT Problem List:   OT Treatment Interventions:     OT Goals(current goals can now be found in the care plan section) Acute Rehab OT Goals Patient Stated Goal: motivated to get better OT Goal  Formulation: With patient/family Time For Goal Achievement: 07/31/13 Potential to Achieve Goals: Good ADL Goals Pt Will Perform Grooming: with min assist;sitting;standing Pt Will Perform Upper Body Bathing: with min assist;sitting Pt Will Perform Lower Body Bathing: sit to/from stand;with adaptive equipment;with mod assist Pt Will Transfer to Toilet: with mod assist;bedside commode Additional ADL Goal #1: Pt will complete bed mobility Min (A) without bed rails and hob ~20 degrees or less Additional ADL Goal #2: Pt will return demonstrates BIL UE HEP Supervision level with theraputty  Visit Information  Last OT Received On: 07/20/13 Assistance Needed: +2 History of Present Illness: 79 yo female admitted with spinal epidural abscess undergoing ACDF.      Subjective Data      Prior Functioning       Cognition  Cognition Arousal/Alertness: Awake/alert Behavior During Therapy: WFL for tasks assessed/performed (impulsive at times) Overall Cognitive Status: Impaired/Different from baseline Area of Impairment: Attention;Safety/judgement;Memory Current Attention Level: Sustained Memory: Decreased recall of precautions Safety/Judgement: Decreased awareness of safety    Mobility  Bed Mobility Overal bed mobility: Needs Assistance Bed Mobility: Rolling;Sidelying to Sit;Sit to Sidelying Rolling: Min assist Sidelying to sit: Mod assist Sit to sidelying: Mod assist General bed mobility comments: Assistance with LE's when going to sidelying position.  Assistance with trunk when going to sitting position. Transfers Overall transfer level: Needs assistance Equipment used: Rolling walker (2 wheeled) Transfers: Sit to/from [facility] Sit to Stand: +2 safety/equipment;Min assist Stand pivot transfers: Min guard    Exercises  Other Exercises Other Exercises: pt performed washcloth exercises (wringing and pulling it). Performed exercises with foam cylinder as well.     Balance    End of Session OT -  End of Session Equipment Utilized During Treatment: Gait belt;Rolling walker Activity Tolerance: Patient tolerated treatment well Patient left: in bed;with call bell/phone within reach;with bed alarm set  GO     Benito Mccreedy OTR/L [phone] 07/20/2013, 4:16 PM

## 2013-07-21 MED ORDER — SODIUM CHLORIDE 0.9 % IJ SOLN: 10.0000 mL | INTRAMUSCULAR | Status: DC | PRN

## 2013-07-21 NOTE — Progress Notes (Signed)
Peripherally Inserted Central Catheter/Midline Placement  The IV Nurse has discussed with the patient and/or persons authorized to consent for the patient, the purpose of this procedure and the potential benefits and risks involved with this procedure.  The benefits include less needle sticks, lab draws from the catheter and patient may be discharged home with the catheter.  Risks include, but not limited to, infection, bleeding, blood clot (thrombus formation), and puncture of an artery; nerve damage and irregular heat beat.  Alternatives to this procedure were also discussed.  PICC/Midline Placement Documentation        [name] 07/21/2013, 9:47 AM

## 2013-07-21 NOTE — Progress Notes (Signed)
Subjective: Patient reports patient frustrated by continued weakness  Objective: Vital signs in last 24 hours: Temp:  [97.9 F (36.6 C)-99.4 F (37.4 C)] 98.5 F (36.9 C) (03/11 0603) Pulse Rate:  [74-83] 78 (03/11 0603) Resp:  [18-20] 18 (03/11 0603) BP: (121-192)/(48-83) 126/59 mmHg (03/11 0603) SpO2:  [94 %-98 %] 95 % (03/11 0603) Weight:  [79.561 kg (175 lb 6.4 oz)] 79.561 kg (175 lb 6.4 oz) (03/10 2020)  Intake/Output from previous day: 03/10 0701 - 03/11 0700 In: 480 [P.O.:480] Out: 200 [Urine:200] Intake/Output this shift:    Physical Exam: Exam reveals stable motor strength in both upper and lower extremities.  Patient able to ambulate with wwalker.  She continues to have triceps and hand intrinsic weakness.  Dysesthesias improving.  Lab Results: No results found for this basename: WBC, HGB, HCT, PLT,  in the last 72 hours BMET No results found for this basename: NA, K, CL, CO2, GLUCOSE, BUN, CREATININE, CALCIUM,  in the last 72 hours  Studies/Results: No results found.  Assessment/Plan: Improving neurologic function.  Awaiting Rehab placement.    LOS: 6 days    Peggyann Shoals, MD 07/21/2013, 7:23 AM

## 2013-07-21 NOTE — Progress Notes (Signed)
Physical Therapy Treatment Patient Details Name: [name] MRN: [id number] DOB: 29-Jun-1934 Today's Date: 07/21/2013 Time: 2536-6440 PT Time Calculation (min): 24 min  PT Assessment / Plan / Recommendation  History of Present Illness 79 yo female admitted with spinal epidural abscess undergoing ACDF.     PT Comments   Pt very pleasant and much more encouraged and positive today with therapy. Pt able to progress ambulation today with multiple sitting rest breaks due to fatigue. Has some gt abnormalities and decr safety awareness that makes her a high fall risk. Pt to be a great candidate for CIR to increase safety and independence with mobility prior to returning home. Pt making great strides and progress with therapy thus far. Continues to c/o pain in bil hips and mid-scapular region.   Follow Up Recommendations  CIR;Supervision/Assistance - 24 hour     Does the patient have the potential to tolerate intense rehabilitation     Barriers to Discharge        Equipment Recommendations  Other (comment) (TBD)    Recommendations for Other Services Rehab consult  Frequency Min 5X/week   Progress towards PT Goals Progress towards PT goals: Progressing toward goals  Plan Current plan remains appropriate    Precautions / Restrictions Precautions Precautions: Fall Restrictions Weight Bearing Restrictions: No   Pertinent Vitals/Pain 5/10 in bil hips; 7/10 in mid-scapular region. patient repositioned for comfort     Mobility  Bed Mobility Overal bed mobility: Needs Assistance Bed Mobility: Supine to Sit Supine to sit: Min assist;+2 for physical assistance General bed mobility comments: pt requires (A) to advance LEs off EOB and to reposition herself at EOB; one person (A) with LEs while the second person controls trunk; pt impulsive at times with mobility; max multidirectional cues for sequencing  Transfers Overall transfer level: Needs assistance Equipment used: Rolling walker  (2 wheeled) Transfers: Sit to/from Stand Sit to Stand: +2 safety/equipment;Min assist General transfer comment: pt uses rocking technique to shift weight forward and being to power up to standing; max multidirectional cues for hand placement and sequencing; pt attempting to pull to stand on RW each transfer; performed sit to stand x 3; pt fatigues quickly with transfers and Rt LE tends to buckle due to weakness Ambulation/Gait Ambulation/Gait assistance: Min assist;+2 safety/equipment Ambulation Distance (Feet): 110 Feet (50 ft; 30 ft 30 ft ) Assistive device: Rolling walker (2 wheeled) Gait Pattern/deviations: Step-through pattern;Decreased stride length;Decreased stance time - right;Wide base of support;Trunk flexed;Drifts right/left Gait velocity: decreased Gait velocity interpretation: Below normal speed for age/gender General Gait Details: Rt knee buckling at times with gt; primarily due to weakness and fatigue; pt fatigues quickly and requires 2 person (A) for safety and chair to follow due to decr safety awarenes; pt impulsive with mobility; max multidirectional cues for upright posture; (A) to manage RW; pt very motivated to incr mobility today     Exercises General Exercises - Lower Extremity Ankle Circles/Pumps: AROM;Both;10 reps;Seated Quad Sets: AROM;Both;Strengthening;5 reps;Seated Long Arc Quad: AROM;Both;10 reps;Seated Hip Flexion/Marching: AROM;Both;10 reps;Strengthening;Other (comment) (c/o bil Hip pain )   PT Diagnosis:    PT Problem List:   PT Treatment Interventions:     PT Goals (current goals can now be found in the care plan section) Acute Rehab PT Goals Patient Stated Goal: motivated to get better PT Goal Formulation: With patient Time For Goal Achievement: 07/24/13 Potential to Achieve Goals: Good  Visit Information  Last PT Received On: 07/21/13 Assistance Needed: +2 History of Present Illness: 78  yo female admitted with spinal epidural abscess undergoing  ACDF.      Subjective Data  Subjective: " tonight was much better. im still having some pain in my back and hips though. im being positive today"  Patient Stated Goal: motivated to get better   Cognition  Cognition Arousal/Alertness: Awake/alert Behavior During Therapy: WFL for tasks assessed/performed (impulsive at times with mobility/transfers) Overall Cognitive Status: Impaired/Different from baseline Area of Impairment: Attention;Safety/judgement;Memory Current Attention Level: Sustained Memory: Decreased recall of precautions Following Commands: Follows one step commands consistently Safety/Judgement: Decreased awareness of safety Awareness: Emergent Problem Solving: Difficulty sequencing;Requires verbal cues;Requires tactile cues General Comments: pt tends to laugh and deflect deficits     Balance  Balance Overall balance assessment: Needs assistance Sitting-balance support: Feet supported;Bilateral upper extremity supported Sitting balance-Leahy Scale: Poor Sitting balance - Comments: pt leaning to Lt sitting EOB and relies on handrails  Standing balance support: During functional activity;Bilateral upper extremity supported Standing balance-Leahy Scale: Poor Standing balance comment: requires bil UE support; pt leaning anteriorly with standing, more so when fatigued  End of Session PT - End of Session Equipment Utilized During Treatment: Gait belt Activity Tolerance: Patient limited by fatigue Patient left: in chair;with call bell/phone within reach;with nursing/sitter in room;with chair alarm set Nurse Communication: Mobility status   GP     Gustavus Bryant, [place] 07/21/2013, 11:34 AM

## 2013-07-21 NOTE — Progress Notes (Signed)
Rehab admission - I have authorization for acute inpatient rehab admission.  Patient and son in agreement to inpatient rehab.  Beds full on rehab today.  Hopeful for a rehab bed to become available in am.  I will follow up in am for possible admission.  Call me for questions.  #[phone]

## 2013-07-22 ENCOUNTER — Inpatient Hospital Stay (HOSPITAL_COMMUNITY)
Admission: RE | Admit: 2013-07-22 | Discharge: 2013-07-28 | DRG: 945 | Disposition: A | Discharge status: Other Acute Inpatient Hospital | Payer: Medicare Other | Source: Intra-hospital | Attending: Physical Medicine & Rehabilitation | Admitting: Physical Medicine & Rehabilitation

## 2013-07-22 ENCOUNTER — Encounter (HOSPITAL_COMMUNITY): Payer: Self-pay | Admitting: *Deleted

## 2013-07-22 DIAGNOSIS — G959 Disease of spinal cord, unspecified: Secondary | ICD-10-CM | POA: Diagnosis present

## 2013-07-22 DIAGNOSIS — R259 Unspecified abnormal involuntary movements: Secondary | ICD-10-CM | POA: Diagnosis present

## 2013-07-22 DIAGNOSIS — G8250 Quadriplegia, unspecified: Secondary | ICD-10-CM | POA: Diagnosis present

## 2013-07-22 DIAGNOSIS — G4730 Sleep apnea, unspecified: Secondary | ICD-10-CM

## 2013-07-22 DIAGNOSIS — Z7982 Long term (current) use of aspirin: Secondary | ICD-10-CM | POA: Diagnosis not present

## 2013-07-22 DIAGNOSIS — Z9884 Bariatric surgery status: Secondary | ICD-10-CM

## 2013-07-22 DIAGNOSIS — R350 Frequency of micturition: Secondary | ICD-10-CM | POA: Diagnosis present

## 2013-07-22 DIAGNOSIS — G4700 Insomnia, unspecified: Secondary | ICD-10-CM | POA: Diagnosis present

## 2013-07-22 DIAGNOSIS — Z9989 Dependence on other enabling machines and devices: Secondary | ICD-10-CM

## 2013-07-22 DIAGNOSIS — Z113 Encounter for screening for infections with a predominantly sexual mode of transmission: Secondary | ICD-10-CM

## 2013-07-22 DIAGNOSIS — Z5189 Encounter for other specified aftercare: Secondary | ICD-10-CM | POA: Diagnosis present

## 2013-07-22 DIAGNOSIS — G061 Intraspinal abscess and granuloma: Secondary | ICD-10-CM | POA: Diagnosis present

## 2013-07-22 DIAGNOSIS — G589 Mononeuropathy, unspecified: Secondary | ICD-10-CM | POA: Diagnosis present

## 2013-07-22 DIAGNOSIS — I10 Essential (primary) hypertension: Secondary | ICD-10-CM | POA: Diagnosis present

## 2013-07-22 DIAGNOSIS — J4489 Other specified chronic obstructive pulmonary disease: Secondary | ICD-10-CM | POA: Diagnosis present

## 2013-07-22 DIAGNOSIS — D72829 Elevated white blood cell count, unspecified: Secondary | ICD-10-CM | POA: Diagnosis present

## 2013-07-22 DIAGNOSIS — E669 Obesity, unspecified: Secondary | ICD-10-CM | POA: Diagnosis present

## 2013-07-22 DIAGNOSIS — A491 Streptococcal infection, unspecified site: Secondary | ICD-10-CM

## 2013-07-22 DIAGNOSIS — K5900 Constipation, unspecified: Secondary | ICD-10-CM | POA: Diagnosis present

## 2013-07-22 DIAGNOSIS — J449 Chronic obstructive pulmonary disease, unspecified: Secondary | ICD-10-CM | POA: Diagnosis present

## 2013-07-22 DIAGNOSIS — Z79899 Other long term (current) drug therapy: Secondary | ICD-10-CM | POA: Diagnosis not present

## 2013-07-22 DIAGNOSIS — M549 Dorsalgia, unspecified: Secondary | ICD-10-CM

## 2013-07-22 DIAGNOSIS — M4712 Other spondylosis with myelopathy, cervical region: Secondary | ICD-10-CM | POA: Diagnosis present

## 2013-07-22 DIAGNOSIS — G4733 Obstructive sleep apnea (adult) (pediatric): Secondary | ICD-10-CM

## 2013-07-22 DIAGNOSIS — G062 Extradural and subdural abscess, unspecified: Secondary | ICD-10-CM

## 2013-07-22 HISTORY — DX: Chronic obstructive pulmonary disease, unspecified: J44.9

## 2013-07-22 LAB — SEDIMENTATION RATE: Sed Rate: 92 mm/hr — ABNORMAL HIGH (ref 0–22)

## 2013-07-22 MED ORDER — ACETAMINOPHEN 325 MG PO TABS
650.0000 mg | ORAL_TABLET | ORAL | Status: DC | PRN
  Administered 2013-07-25 – 2013-07-26 (×2): 650 mg via ORAL
  Filled 2013-07-22 (×3): qty 2

## 2013-07-22 MED ORDER — TRAZODONE HCL 50 MG PO TABS
50.0000 mg | ORAL_TABLET | Freq: Every day | ORAL | Status: DC
  Administered 2013-07-23 – 2013-07-27 (×6): 50 mg via ORAL
  Filled 2013-07-22 (×10): qty 1

## 2013-07-22 MED ORDER — DOCUSATE SODIUM 100 MG PO CAPS
100.0000 mg | ORAL_CAPSULE | Freq: Two times a day (BID) | ORAL | Status: DC
  Administered 2013-07-22 – 2013-07-27 (×11): 100 mg via ORAL
  Filled 2013-07-22 (×17): qty 1

## 2013-07-22 MED ORDER — CYANOCOBALAMIN 500 MCG PO TABS
500.0000 ug | ORAL_TABLET | Freq: Every day | ORAL | Status: DC
Start: 2013-07-23 — End: 2013-07-28
  Administered 2013-07-23 – 2013-07-27 (×5): 500 ug via ORAL
  Filled 2013-07-22 (×7): qty 1

## 2013-07-22 MED ORDER — LISINOPRIL 5 MG PO TABS
5.0000 mg | ORAL_TABLET | Freq: Every day | ORAL | Status: DC
  Administered 2013-07-23 – 2013-07-27 (×5): 5 mg via ORAL
  Filled 2013-07-22 (×8): qty 1

## 2013-07-22 MED ORDER — SORBITOL 70 % SOLN: 30.0000 mL | Freq: Every day | Status: DC | PRN

## 2013-07-22 MED ORDER — FLUTICASONE PROPIONATE 50 MCG/ACT NA SUSP
2.0000 | Freq: Every day | NASAL | Status: DC
Start: 2013-07-23 — End: 2013-07-28
  Administered 2013-07-23 – 2013-07-27 (×5): 2 via NASAL
  Filled 2013-07-22: qty 16

## 2013-07-22 MED ORDER — ONDANSETRON HCL 4 MG/2ML IJ SOLN: 4.0000 mg | Freq: Four times a day (QID) | INTRAMUSCULAR | Status: DC | PRN

## 2013-07-22 MED ORDER — METHOCARBAMOL 500 MG PO TABS
500.0000 mg | ORAL_TABLET | Freq: Four times a day (QID) | ORAL | Status: DC | PRN
  Administered 2013-07-22 – 2013-07-27 (×13): 500 mg via ORAL
  Filled 2013-07-22 (×13): qty 1

## 2013-07-22 MED ORDER — CALCIUM CARBONATE 1250 (500 CA) MG PO TABS
2.0000 | ORAL_TABLET | Freq: Every day | ORAL | Status: DC
  Administered 2013-07-24 – 2013-07-27 (×4): 1000 mg via ORAL
  Filled 2013-07-22 (×10): qty 2

## 2013-07-22 MED ORDER — HYDROCODONE-ACETAMINOPHEN 5-325 MG PO TABS
1.0000 | ORAL_TABLET | ORAL | Status: DC | PRN
  Administered 2013-07-22 – 2013-07-27 (×24): 2 via ORAL
  Filled 2013-07-22 (×27): qty 2

## 2013-07-22 MED ORDER — ACETAMINOPHEN 650 MG RE SUPP: 650.0000 mg | RECTAL | Status: DC | PRN

## 2013-07-22 MED ORDER — ALUM & MAG HYDROXIDE-SIMETH 200-200-20 MG/5ML PO SUSP: 30.0000 mL | Freq: Four times a day (QID) | ORAL | Status: DC | PRN

## 2013-07-22 MED ORDER — DEXTROSE 5 % IV SOLN
2.0000 g | Freq: Two times a day (BID) | INTRAVENOUS | Status: DC
  Administered 2013-07-22 – 2013-07-27 (×11): 2 g via INTRAVENOUS
  Filled 2013-07-22 (×13): qty 2

## 2013-07-22 MED ORDER — ASPIRIN 81 MG PO CHEW
81.0000 mg | CHEWABLE_TABLET | Freq: Every day | ORAL | Status: DC
  Administered 2013-07-23 – 2013-07-27 (×5): 81 mg via ORAL
  Filled 2013-07-22 (×7): qty 1

## 2013-07-22 MED ORDER — GABAPENTIN 300 MG PO CAPS
300.0000 mg | ORAL_CAPSULE | Freq: Three times a day (TID) | ORAL | Status: DC
  Administered 2013-07-22 – 2013-07-27 (×17): 300 mg via ORAL
  Filled 2013-07-22 (×21): qty 1

## 2013-07-22 MED ORDER — ADULT MULTIVITAMIN W/MINERALS CH
1.0000 | ORAL_TABLET | Freq: Every day | ORAL | Status: DC
  Administered 2013-07-23 – 2013-07-27 (×5): 1 via ORAL
  Filled 2013-07-22 (×8): qty 1

## 2013-07-22 MED ORDER — VITAMIN D3 25 MCG (1000 UNIT) PO TABS
2000.0000 [IU] | ORAL_TABLET | Freq: Every day | ORAL | Status: DC
  Administered 2013-07-23 – 2013-07-27 (×5): 2000 [IU] via ORAL
  Filled 2013-07-22 (×9): qty 2

## 2013-07-22 MED ORDER — MOMETASONE FURO-FORMOTEROL FUM 100-5 MCG/ACT IN AERO
2.0000 | INHALATION_SPRAY | Freq: Two times a day (BID) | RESPIRATORY_TRACT | Status: DC
  Administered 2013-07-22 – 2013-07-28 (×10): 2 via RESPIRATORY_TRACT
  Filled 2013-07-22 (×2): qty 8.8

## 2013-07-22 MED ORDER — SENNA 8.6 MG PO TABS
1.0000 | ORAL_TABLET | Freq: Two times a day (BID) | ORAL | Status: DC
  Administered 2013-07-22 – 2013-07-27 (×11): 8.6 mg via ORAL
  Filled 2013-07-22 (×15): qty 1

## 2013-07-22 MED ORDER — BISACODYL 10 MG RE SUPP: 10.0000 mg | Freq: Every day | RECTAL | Status: DC | PRN

## 2013-07-22 MED ORDER — FERROUS FUMARATE 325 (106 FE) MG PO TABS
1.0000 | ORAL_TABLET | ORAL | Status: DC
  Administered 2013-07-23 – 2013-07-26 (×2): 106 mg via ORAL
  Filled 2013-07-22 (×3): qty 1

## 2013-07-22 MED ORDER — OXYBUTYNIN CHLORIDE 5 MG PO TABS
2.5000 mg | ORAL_TABLET | Freq: Two times a day (BID) | ORAL | Status: DC
  Administered 2013-07-22 – 2013-07-27 (×11): 2.5 mg via ORAL
  Filled 2013-07-22 (×15): qty 0.5

## 2013-07-22 MED ORDER — ONDANSETRON HCL 4 MG PO TABS: 4.0000 mg | ORAL_TABLET | Freq: Four times a day (QID) | ORAL | Status: DC | PRN

## 2013-07-22 MED ORDER — POLYETHYLENE GLYCOL 3350 17 G PO PACK
17.0000 g | PACK | Freq: Every day | ORAL | Status: DC | PRN
  Filled 2013-07-22: qty 1

## 2013-07-22 MED ORDER — FUROSEMIDE 20 MG PO TABS
20.0000 mg | ORAL_TABLET | Freq: Every day | ORAL | Status: DC
  Administered 2013-07-24 – 2013-07-26 (×2): 20 mg via ORAL
  Filled 2013-07-22 (×7): qty 1

## 2013-07-22 MED ORDER — LORATADINE 10 MG PO TABS
10.0000 mg | ORAL_TABLET | Freq: Every day | ORAL | Status: DC
  Administered 2013-07-23 – 2013-07-27 (×5): 10 mg via ORAL
  Filled 2013-07-22 (×9): qty 1

## 2013-07-22 NOTE — H&P (Signed)
Physical Medicine and Rehabilitation Admission H&P  Chief Complaint   Patient presents with   .  Shoulder Pain   .  Back Pain   :  Chief complaint: Back pain  HPI: [name] is a 79 y.o. right-handed female with history of hypertension, COPD. Admitted 07/16/2013 with progressive quadriparesis x3 days. Denied any bowel or bladder disturbances. Patient with low-grade fever 102.2 white blood cell count of 14,000. MRI and imaging revealed cervical epidural abscess. Underwent emergent anterior cervical decompression discectomy with fusion multilevel C4-C7 07/16/2013 per Dr. Vertell Limber. Decadron protocol is advised. Followup infectious disease with spinal abscess fluid growing and microaerophilic streptococci and maintained on ceftriaxone as well as vancomycin. Vancomycin discontinued 07/19/2013. Advise 6 weeks of ceftriaxone through April 16 with PICC line to be placed. Postoperative pain management. Physical and occupational therapy evaluations completed 07/17/2013 with recommendations for physical medicine rehabilitation consult. Patient was admitted for comprehensive rehabilitation program   Pt has good relief from percocet No bladder issues except frequency  Had good result with Dulcolax suppository.  Denies any numbness around the perianal area  Had good sensation for both bladder and bowel  Complains of numbness in the hands    ROS Review of Systems  Respiratory: Positive for shortness of breath.  Gastrointestinal: Positive for constipation.  Musculoskeletal: Positive for joint pain and myalgias.  Neurological: Positive for weakness. Insomnia  All other systems reviewed and are negative  Past Medical History   Diagnosis  Date   .  Hypertension    .  Asthma    .  Cancer      Skin Cancer   .  Sleep apnea     Past Surgical History   Procedure  Laterality  Date   .  Gastric bypass     .  Anterior cervical decomp/discectomy fusion  N/A  07/16/2013     Procedure: ANTERIOR CERVICAL  DECOMPRESSION/DISCECTOMY FUSION mutlipleLEVELS C4-7; Surgeon: Erline Levine, MD; Location: [facility]; Service: Neurosurgery; Laterality: N/A;    History reviewed. No pertinent family history.  Social History: reports that she has never smoked. She does not have any smokeless tobacco history on file. She reports that she does not drink alcohol. Her drug history is not on file.  Allergies:  Allergies   Allergen  Reactions   .  Codeine  Nausea Only   .  Iohexol  Itching     07-15-13 pt developed itching on fingers after contrast given. Dr. Irish Elders looked at pt and said to put in system as allergy. BB   .  Synvisc [Hylan G-F 20]     Medications Prior to Admission   Medication  Sig  Dispense  Refill   .  acetaminophen (TYLENOL) 650 MG CR tablet  Take 1,300 mg by mouth every 8 (eight) hours as needed for pain.     Marland Kitchen  aspirin 81 MG tablet  Take 81 mg by mouth daily.     .  calcium carbonate (OS-CAL - DOSED IN MG OF ELEMENTAL CALCIUM) 1250 MG tablet  Take 2 tablets by mouth daily.     .  Cholecalciferol (VITAMIN D) 2000 UNITS CAPS  Take 2,000 Units by mouth daily.     .  cyanocobalamin 500 MCG tablet  Take 500 mcg by mouth daily.     .  ferrous fumarate (HEMOCYTE - 106 MG FE) 325 (106 FE) MG TABS  Take 1 tablet by mouth 3 (three) times a week.     .  fluticasone (FLONASE)  50 MCG/ACT nasal spray  Place 2 sprays into both nostrils daily.     .  Fluticasone-Salmeterol (ADVAIR) 250-50 MCG/DOSE AEPB  Inhale 1 puff into the lungs 2 (two) times daily.     .  furosemide (LASIX) 20 MG tablet  Take 20 mg by mouth daily.     Marland Kitchen  HYDROcodone-acetaminophen (NORCO/VICODIN) 5-325 MG per tablet  Take 1-2 tablets by mouth every 4 (four) hours as needed for moderate pain.     Marland Kitchen  ibuprofen (ADVIL,MOTRIN) 200 MG tablet  Take 400 mg by mouth every 6 (six) hours as needed.     Marland Kitchen  lisinopril (PRINIVIL,ZESTRIL) 5 MG tablet  Take 5 mg by mouth daily.     Marland Kitchen  loratadine (CLARITIN) 10 MG tablet  Take 10 mg by mouth daily.     .   Multiple Vitamins-Minerals (MULTIVITAMIN WITH MINERALS) tablet  Take 1 tablet by mouth daily.     .  naproxen sodium (ANAPROX) 220 MG tablet  Take 220 mg by mouth 2 (two) times daily with a meal.     .  oxybutynin (DITROPAN) 5 MG tablet  Take 2.5 mg by mouth 2 (two) times daily.     .  predniSONE (DELTASONE) 10 MG tablet  Take 40 mg by mouth daily. Pt taking 40 mg a day for 3 days, then 30 mg a day for 3 days, 20 mg a day for 3 days, 10 mg a day for 3 days     .  PREVNAR 13 SUSP injection  Inject 0.5 mLs into the muscle once.     .  traZODone (DESYREL) 50 MG tablet  Take 50 mg by mouth at bedtime.     .  clotrimazole-betamethasone (LOTRISONE) cream       Home:  Home Living  Family/patient expects to be discharged to:: Private residence  Living Arrangements: Alone  Available Help at Discharge: Family;Available 24 hours/day (maybe sister for a few weeks per pt)  Type of Home: House  Home Access: Stairs to enter  [employer] of Steps: 3  Entrance Stairs-Rails: None  Home Layout: One level  Home Equipment: Shower seat;Tub bench;Walker - 2 wheels;Walker - 4 wheels;Transport chair;Bedside commode;Grab bars - tub/shower;Grab bars - toilet;Hand held shower head;Cane - single point (base for recliner)  Additional Comments: baseline patient uses no DME but has DME from [facility] surg in the past  Functional History:   Functional Status:  Mobility:    Ambulation/Gait  Ambulation Distance (Feet): 135 Feet (20 feet then 10 feet then 40 feet then 65 feet)  General Gait Details: Pt not impulsive today but sluggish. Pt with flexed trunk, hips and knees. Pt having incr difficulty with postural stability. Fatiguing much more rapidly than last session with poorer postural stability. Needed mod assist as well as tactile and verbal cuing for posture as well as mod cues for sequencing steps and steering RW. Needed several seated rest breaks as she fatigues, her safety worsens.   ADL:  ADL   Eating/Feeding: Set up (with AE)  Where Assessed - Eating/Feeding: Chair;Bed level  Grooming: Wash/dry hands;Teeth care;Brushing hair;Moderate assistance  Where Assessed - Grooming: Supported standing  Upper Body Bathing: Chest;Right arm;Left arm;Abdomen;Simulated;Moderate assistance  Where Assessed - Upper Body Bathing: Supported sitting  Lower Body Dressing: Maximal assistance  Where Assessed - Lower Body Dressing: Supported sit to Retail buyer: Moderate assistance (+2)  Toilet Transfer Method: Sit to stand;Stand pivot  Toilet Transfer Equipment: Raised toilet seat with arms (  or 3-in-1 over toilet)  Equipment Used: Gait belt;Rolling walker  Transfers/Ambulation Related to ADLs: mod A +2 with RW (occasionally min A +2)  ADL Comments: Pt provided with lidded mug and built up handled, curved utensils for self feeding . She is able to drink from lidded mug mod I  Cognition:  Cognition  Overall Cognitive Status: Impaired/Different from baseline  Orientation Level: Oriented X4  Cognition  Arousal/Alertness: Awake/alert  Behavior During Therapy: Impulsive;Restless  Overall Cognitive Status: Impaired/Different from baseline  Area of Impairment: Following commands;Safety/judgement;Awareness;Problem solving;Attention  Orientation Level: Disoriented to;Situation;Place (Woke up and did not know where she was and got up.)  Current Attention Level: Sustained  Memory: Decreased recall of precautions  Following Commands: Follows one step commands consistently  Safety/Judgement: Decreased awareness of safety;Decreased awareness of deficits  Awareness: Emergent  Problem Solving: Difficulty sequencing;Requires verbal cues;Requires tactile cues  General Comments: Pt oriented today.  Physical Exam:  Blood pressure 179/82, pulse 78, temperature 98.2 F (36.8 C), temperature source Oral, resp. rate 18, height 5\' 3"  (1.6 m), weight 119.5 kg (263 lb 7.2 oz), SpO2 92.00%.  Physical Exam   Constitutional:  79 year old obese female  HENT:  Head: Normocephalic.  Eyes: EOM are normal.  Neck: Normal range of motion. Neck supple. No thyromegaly present.  Cardiovascular: Normal rate and regular rhythm.  Respiratory: Effort normal and breath sounds normal. No respiratory distress.  GI: Soft. Bowel sounds are normal. She exhibits no distension.  Neurological:  Patient is alert and anxious during exam. She is oriented x3 and followed basic commands. RUE is 4- deltoid, bicep, 2- R tricep, 3-Left triceps, 2+ R wrist ex, 3+/5 L wrist ext , 2- R and 3- Left HI, 3 minus bilateral hip flexors, 3+ right knee extensor, for/5 left knee extensor, 5/5 bilateral ankle dorsiflexor plantar flexor . Sensation 1-/2 RUE, 1/2 LUE, 3+ bilateral bicep triceps and patellar reflexes, absent tricep reflexes, 1+Achilles reflexes  Skin:  Surgical site clean and dry no swelling or erythema  Psychiatric: She has a normal mood and affect. Her behavior is normal. Judgment and thought content normal  No results found for this or any previous visit (from the past 48 hour(s)).  No results found.  Post Admission Physician Evaluation:  1. Functional deficits secondary to cervical myelopathy, incomplete tetraplegia secondary to cervical epidural abscess. 2. Patient is admitted to receive collaborative, interdisciplinary care between the physiatrist, rehab nursing staff, and therapy team. 3. Patient's level of medical complexity and substantial therapy needs in context of that medical necessity cannot be provided at a lesser intensity of care such as a SNF. 4. Patient has experienced substantial functional loss from his/her baseline which was documented above under the "Functional History" and "Functional Status" headings. Judging by the patient's diagnosis, physical exam, and functional history, the patient has potential for functional progress which will result in measurable gains while on inpatient rehab. These gains  will be of substantial and practical use upon discharge in facilitating mobility and self-care at the household level. 5. Physiatrist will provide 24 hour management of medical needs as well as oversight of the therapy plan/treatment and provide guidance as appropriate regarding the interaction of the two. 6. 24 hour rehab nursing will assist with bladder management, bowel management, safety, skin/wound care, disease management, medication administration, pain management and patient education and help integrate therapy concepts, techniques,education, etc. 7. PT will assess and treat for/with: pre gait, gait training, endurance , safety, equipment, neuromuscular re education. Goals are: Sup Mob. 8. OT will  assess and treat for/with: ADLs, Cognitive perceptual skills, Neuromuscular re education, safety, endurance, equipment. Goals are: Sup/minA ADL. 9. SLP will assess and treat for/with: NA. Goals are: NA. 10. Case Management and Social Worker will assess and treat for psychological issues and discharge planning. 11. Team conference will be held weekly to assess progress toward goals and to determine barriers to discharge. 12. Patient will receive at least 3 hours of therapy per day at least 5 days per week. 13. ELOS: 15-23days  14. Prognosis: good Medical Problem List and Plan:  1. Cervical epidural abscess with tetraplegia status post decompression and fusion  2. DVT Prophylaxis/Anticoagulation: SCDs. Monitor for any signs of DVT  3. Pain Management/neuropathic pain: Neurontin 300 mg 3 times a day, Hydrocodone and Robaxin as needed. Monitor with increased mobility  3. Mood/insomnia: Trazodone 50 mg each bedtime  5. Neuropsych: This patient is capable of making decisions on her own behalf.  6. ID/microaerophilic streptococci. Ceftriaxone x6 weeks through April 16. Followup infectious disease  7. Hypertension. Lisinopril 5 mg daily, Lasix 20 mg daily. Monitor with increased mobility  8. Asthma.  Dulera 2 puffs twice a day, Flonase daily. No complaints of shortness of breath. Continue CPAP  9. Urinary frequency. Ditropan 2.5 mg twice a day. Check urine study.  10. Obesity. History of gastric bypass   Charlett Blake M.D. [facility] FAAPM&R (Sports Med, Neuromuscular Med) Diplomate Am Board of Electrodiagnostic Med   07/20/2013

## 2013-07-22 NOTE — Progress Notes (Signed)
INFECTIOUS DISEASE ATTENDING ADDENDUM:     [facility]ious Disease   Date: 07/22/2013  Patient name: [name]  Medical record number: [id number]  Date of birth: April 11, 1935    This patient has been seen and discussed with the house staff. Please see their note for complete details. I concur with their findings with the following additions/corrections:  79 year old with epidural abscess due to micro-aerophillic strep   --continue IV rocephin thru April 16 --check weekly cbc, bmp  --she should followup with [place] in [facility] prior to her stop date, if she is still an inpatient at [facility] rehab please call us to re-evaluate her.  I will otherwise sign off at this time. Please call with further questions.   Alcide Evener 07/22/2013, 4:07 PM

## 2013-07-22 NOTE — Progress Notes (Signed)
Physical Therapy Treatment Patient Details Name: [name] MRN: [id number] DOB: [birth year] Today's Date: 07/22/2013 Time: 7106-2694 PT Time Calculation (min): 27 min  PT Assessment / Plan / Recommendation  History of Present Illness 79 yo female admitted with spinal epidural abscess undergoing ACDF.     PT Comments   Patient continues to be highly motivated with therapy. Patient progressing well however still limited in hall ambulation due to fatigue and needing rest breaks. Continue to recommend CIR at discharge. Will continue with current POC  Follow Up Recommendations  CIR;Supervision/Assistance - 24 hour     Does the patient have the potential to tolerate intense rehabilitation     Barriers to Discharge        Equipment Recommendations  Other (comment)    Recommendations for Other Services    Frequency Min 5X/week   Progress towards PT Goals Progress towards PT goals: Progressing toward goals  Plan Current plan remains appropriate    Precautions / Restrictions Precautions Precautions: Fall   Pertinent Vitals/Pain no apparent distress     Mobility  Bed Mobility Sit to sidelying: Mod assist General bed mobility comments: A for LEs back into bed. Cues for postioning in bed. Patient unable to feel that she was not straight once back into bed Transfers Overall transfer level: Needs assistance Equipment used: Rolling walker (2 wheeled) Sit to Stand: Min assist General transfer comment: pt uses rocking technique to shift weight forward and being to power up to standing; max multidirectional cues for hand placement and sequencing; Patient recalling 50% of time to push up from armrest of recliner; performed sit to stand x 4; pt fatigues quickly with transfers and Rt LE tends to buckle due to weakness Ambulation/Gait Ambulation/Gait assistance: Min assist;+2 safety/equipment Ambulation Distance (Feet): 150 Feet Assistive device: Rolling walker (2 wheeled) Gait  Pattern/deviations: Step-through pattern;Drifts right/left;Decreased stride length Gait velocity: decreased General Gait Details: Rt knee continues to buckle and R LE drift when fatigued. Cues for upright posture and control of LEs inside of RW. Continues to be impulsive and attempted to sit once without RW behind her. 3 sitting rest breaks required    Exercises     PT Diagnosis:    PT Problem List:   PT Treatment Interventions:     PT Goals (current goals can now be found in the care plan section)    Visit Information  Last PT Received On: 07/22/13 Assistance Needed: +2 (for chair follow) History of Present Illness: 79 yo female admitted with spinal epidural abscess undergoing ACDF.      Subjective Data      Cognition  Cognition Arousal/Alertness: Awake/alert Behavior During Therapy: WFL for tasks assessed/performed Overall Cognitive Status: Impaired/Different from baseline Area of Impairment: Attention;Safety/judgement;Memory Current Attention Level: Sustained Memory: Decreased recall of precautions Following Commands: Follows one step commands consistently Safety/Judgement: Decreased awareness of safety Problem Solving: Difficulty sequencing;Requires verbal cues;Requires tactile cues General Comments: pt tends to laugh and deflect deficits. Patient impulsive at time and needs cueing to slow down    Balance  Balance Sitting balance-Leahy Scale: Poor Standing balance-Leahy Scale: Poor  End of Session PT - End of Session Equipment Utilized During Treatment: Gait belt Activity Tolerance: Patient tolerated treatment well Patient left: in bed;with call bell/phone within reach Nurse Communication: Mobility status   GP     [name] 07/22/2013, 10:29 AM 07/22/2013 [name] PTA [phone] pager [phone] office

## 2013-07-22 NOTE — Progress Notes (Signed)
Pt visited for CPAP placement.  Pt had home unit and mask.  Pt stated that she wasn't ready for mask yet and that she may place mask herself.  Pt notified to let nurse know if she needed help, so nurse can call RT if help is needed.

## 2013-07-22 NOTE — Progress Notes (Signed)
Assisted patient with placement of home CPAP mask.  Unit set up at bedside.  Patient tolerating well at this time.

## 2013-07-22 NOTE — Progress Notes (Signed)
Patient discharged from room 4N22 at this time. transferring to unit 4MW. Alert and in stable condition. Report called to nurse. All belongings moved with patient.

## 2013-07-22 NOTE — PMR Pre-admission (Signed)
PMR Admission Coordinator Pre-Admission Assessment  Patient: [name] is an 79 y.o., female MRN: [id number] DOB: June 03, 1934 Height: '5\' 8"'  (172.7 cm) Weight: 79.561 kg (175 lb 6.4 oz)              Insurance Information HMO:      PPO: Yes     PCP:       IPA:       80/20:       OTHER:  Group # [id number] PRIMARY: [employer] medicare      Policy#: [id number]      Subscriber: [name] CM Name: [name]      Phone#: [phone]     Fax#:   Pre-Cert#: [id number] with updates per on site reviewer      Employer: Retired Benefits:  Phone #: [phone]     Name: [name]. Date: 05/13/13     Deduct: $0      Out of Pocket Max: $2600( met$120.81)      Life Max: unlimited CIR: $150 days 1-8      SNF: $0 days 1-20, $50 days 21-100 Outpatient: with medical necessity     Co-Pay: $10/therapy visit Home Health: 100%      Co-Pay: none DME: 80%     Co-Pay: 20% Providers: in network   Emergency [place]   Name Relation Home Work [place] Son   [phone]   M[place], Bechler [phone]  [phone]     Current Medical History  Patient Admitting Diagnosis: Cervical epidural abscess with tetraplegia s/p decompression and fusion   History of Present Illness: A 79 y.o. right-handed female with history of hypertension, COPD. Admitted 07/16/2013 with progressive quadriparesis x3 days. Denied any bowel or bladder disturbances. Patient with low-grade fever 102.2 white blood cell count of 14,000. MRI and imaging revealed cervical epidural abscess. Underwent emergent anterior cervical decompression discectomy with fusion multilevel C4-C7 07/16/2013 per Dr. Vertell Limber. Decadron protocol is advised. Followup infectious disease with spinal abscess fluid growing and microaerophilic streptococci and maintained on ceftriaxone as well as vancomycin. Vancomycin discontinued 07/19/2013. Advise 6 weeks of ceftriaxone through April 16 with PICC line to be placed.  Postoperative pain management. Physical and occupational therapy evaluations completed 07/17/2013 with recommendations for physical medicine rehabilitation consult. Patient will be admitted for comprehensive rehabilitation program.    Past Medical History  Past Medical History  Diagnosis Date  . Hypertension   . Asthma   . Cancer     Skin Cancer  . Sleep apnea     Family History  family history is not on file.  Prior Rehab/Hospitalizations:  Had TKR and went to [facility]   Current Medications  Current facility-administered medications:acetaminophen (TYLENOL) suppository 650 mg, 650 mg, Rectal, Q4H PRN, Erline Levine, MD;  acetaminophen (TYLENOL) tablet 650 mg, 650 mg, Oral, Q4H PRN, Erline Levine, MD;  alum & mag hydroxide-simeth (MAALOX/MYLANTA) 200-200-20 MG/5ML suspension 30 mL, 30 mL, Oral, Q6H PRN, Erline Levine, MD;  aspirin chewable tablet 81 mg, 81 mg, Oral, Daily, Wilhelmina Mcardle, MD, 81 mg at 07/22/13 0950 bisacodyl (DULCOLAX) suppository 10 mg, 10 mg, Rectal, Daily PRN, Erline Levine, MD, 10 mg at 07/21/13 1829;  calcium carbonate (OS-CAL - dosed in mg of elemental calcium) tablet 1,000 mg of elemental calcium, 2 tablet, Oral, Q breakfast, Wilhelmina Mcardle, MD, 1,000 mg of elemental calcium at 07/22/13 0750;  cefTRIAXone (ROCEPHIN) 2 g in dextrose 5 % 50 mL IVPB, 2 g, Intravenous, Q12H, Erline Levine, MD,  2 g at 07/22/13 4098 cholecalciferol (VITAMIN D) tablet 2,000 Units, 2,000 Units, Oral, Daily, Erline Levine, MD, 2,000 Units at 07/22/13 0950;  cloNIDine (CATAPRES) tablet 0.1 mg, 0.1 mg, Oral, BID PRN, Winfield Cunas, MD, 0.1 mg at 07/17/13 2205;  cyanocobalamin tablet 500 mcg, 500 mcg, Oral, Daily, Wilhelmina Mcardle, MD, 500 mcg at 07/22/13 1191;  docusate sodium (COLACE) capsule 100 mg, 100 mg, Oral, BID, Wilhelmina Mcardle, MD, 100 mg at 07/22/13 0957 ferrous fumarate (HEMOCYTE - 106 mg FE) tablet 106 mg of iron, 1 tablet, Oral, Once per day on Mon Wed Fri, Wilhelmina Mcardle, MD,  106 mg of iron at 07/21/13 0819;  fluticasone (FLONASE) 50 MCG/ACT nasal spray 2 spray, 2 spray, Each Nare, Daily, Wilhelmina Mcardle, MD, 2 spray at 07/22/13 [phone];  furosemide (LASIX) tablet 20 mg, 20 mg, Oral, Daily, Wilhelmina Mcardle, MD, 20 mg at 07/22/13 0950 gabapentin (NEURONTIN) capsule 300 mg, 300 mg, Oral, TID, Erline Levine, MD, 300 mg at 07/22/13 0950;  HYDROcodone-acetaminophen (NORCO/VICODIN) 5-325 MG per tablet 1-2 tablet, 1-2 tablet, Oral, Q4H PRN, Wilhelmina Mcardle, MD, 1 tablet at 07/22/13 0949;  lisinopril (PRINIVIL,ZESTRIL) tablet 5 mg, 5 mg, Oral, Daily, Wilhelmina Mcardle, MD, 5 mg at 07/22/13 0950 loratadine (CLARITIN) tablet 10 mg, 10 mg, Oral, Daily, Wilhelmina Mcardle, MD, 10 mg at 07/22/13 0950;  menthol-cetylpyridinium (CEPACOL) lozenge 3 mg, 1 lozenge, Oral, PRN, Erline Levine, MD;  methocarbamol (ROBAXIN) 500 mg in dextrose 5 % 50 mL IVPB, 500 mg, Intravenous, Q6H PRN, Erline Levine, MD;  methocarbamol (ROBAXIN) tablet 500 mg, 500 mg, Oral, Q6H PRN, Erline Levine, MD, 500 mg at 07/21/13 1401 mometasone-formoterol (DULERA) 100-5 MCG/ACT inhaler 2 puff, 2 puff, Inhalation, BID, Wilhelmina Mcardle, MD, 2 puff at 07/21/13 2126;  multivitamin with minerals tablet 1 tablet, 1 tablet, Oral, Daily, Wilhelmina Mcardle, MD, 1 tablet at 07/22/13 9562;  oxybutynin (DITROPAN) tablet 2.5 mg, 2.5 mg, Oral, BID, Wilhelmina Mcardle, MD, 2.5 mg at 07/22/13 1308 oxyCODONE-acetaminophen (PERCOCET/ROXICET) 5-325 MG per tablet 1-2 tablet, 1-2 tablet, Oral, Q4H PRN, Erline Levine, MD, 1 tablet at 07/21/13 1401;  phenol (CHLORASEPTIC) mouth spray 1 spray, 1 spray, Mouth/Throat, PRN, Erline Levine, MD;  polyethylene glycol (MIRALAX / GLYCOLAX) packet 17 g, 17 g, Oral, Daily PRN, Erline Levine, MD;  senna [facility]) tablet 8.6 mg, 1 tablet, Oral, BID, Wilhelmina Mcardle, MD, 8.6 mg at 07/22/13 0950 sodium chloride 0.9 % injection 10-40 mL, 10-40 mL, Intracatheter, PRN, Erline Levine, MD;  traZODone (DESYREL) tablet 50 mg, 50 mg, Oral, QHS,  Wilhelmina Mcardle, MD, 50 mg at 07/20/13 2145  Patients Current Diet: General  Precautions / Restrictions Precautions Precautions: Fall Restrictions Weight Bearing Restrictions: No   Prior Activity Level Community (5-7x/wk): Went out daily.  Home Assistive Devices / Equipment Home Assistive Devices/Equipment: None Home Equipment: Shower seat;Tub bench;Walker - 2 wheels;Walker - 4 wheels;Transport chair;Bedside commode;Grab bars - tub/shower;Grab bars - toilet;Hand held shower head;Cane - single point (base for recliner)  Prior Functional Level Prior Function Level of Independence: Independent  Current Functional Level Cognition  Overall Cognitive Status: Impaired/Different from baseline Current Attention Level: Sustained Orientation Level: Oriented X4 Following Commands: Follows one step commands consistently Safety/Judgement: Decreased awareness of safety General Comments: pt tends to laugh and deflect deficits. Patient impulsive at time and needs cueing to slow down    Extremity Assessment (includes Sensation/Coordination)          ADLs  Eating/Feeding - set up with AE  Grooming: Teeth care;Brushing hair;Minimal assistance;Other (comment) (shaving)  Where Assessed - Grooming: Supported standing;Supported sitting  Toilet Transfer: Minimal assistance;+2 Total assistance  Toilet Transfer Method: Sit to stand;Stand pivot  Science writer: Other (comment) (bed <> chair) Toileting - Clothing Manipulation and Hygiene: Moderate assistance Equipment Used: Gait belt;Rolling walker;Other (comment) (built up tubing for exercises)  Transfers/Ambulation Related to ADLs: Min A (+2 for safety) for sit to stand transfers. Min guard for stand pivot. +2 Total A (safety)/Min guard for ambulation (short distance). Stated knees felt like they were giving out and feet felt like bricks.  ADL Comments: Performed grooming at sink-several cues for precautions. Performed hand exercises  sitting in chair.     Mobility  Overal bed mobility: Needs Assistance Bed Mobility: Supine to Sit Rolling: Min assist Sidelying to sit: Mod assist Supine to sit: Min assist;+2 for physical assistance Sit to sidelying: Mod assist General bed mobility comments: A for LEs back into bed. Cues for postioning in bed. Patient unable to feel that she was not straight once back into bed    Transfers  Overall transfer level: Needs assistance Equipment used: Rolling walker (2 wheeled) Transfers: Sit to/from Stand Sit to Stand: Min assist Stand pivot transfers: Min guard General transfer comment: pt uses rocking technique to shift weight forward and being to power up to standing; max multidirectional cues for hand placement and sequencing; Patient recalling 50% of time to push up from armrest of recliner; performed sit to stand x 4; pt fatigues quickly with transfers and Rt LE tends to buckle due to weakness    Ambulation / Gait / Stairs / Wheelchair Mobility  Ambulation/Gait Ambulation/Gait assistance: Min assist;+2 safety/equipment Ambulation Distance (Feet): 150 Feet Assistive device: Rolling walker (2 wheeled) Gait Pattern/deviations: Step-through pattern;Drifts right/left;Decreased stride length Gait velocity: decreased Gait velocity interpretation: Below normal speed for age/gender General Gait Details: Rt knee continues to buckle and R LE drift when fatigued. Cues for upright posture and control of LEs inside of RW. Continues to be impulsive and attempted to sit once without RW behind her. 3 sitting rest breaks required    Posture / Balance Dynamic Sitting Balance Sitting balance - Comments: pt leaning to Lt sitting EOB and relies on handrails     Special needs/care consideration BiPAP/CPAP Yes, has CPAP at home CPM No Continuous Drip IV No  Dialysis No       Life Vest No Oxygen No Special Bed No Trach Size No Wound Vac (area) No    Skin                              Bowel mgmt:  Last BM 07/20/13, had to be disimpacted Bladder mgmt: Voiding on BSC/BRP with help, had some incontinence earlier in hospital stay Diabetic mgmt No    Previous Home Environment Living Arrangements: Alone Available Help at Discharge: Family;Available 24 hours/day (maybe sister for a few weeks per pt) Type of Home: House Home Layout: One level Home Access: Stairs to enter Entrance Stairs-Rails: None Entrance Stairs-Number of Steps: 3 Bathroom Shower/Tub: Product/process development scientist: Standard Home Care Services: No Additional Comments: baseline patient uses no DME but has DME from [facility] in the past  Discharge Living Setting Plans for Discharge Living Setting: Patient's home;Alone;House Type of Home at Discharge: House Discharge Home Layout: One level Discharge Home Access: Stairs to enter Entrance Stairs-Number of Steps: 3-4 step entry Does the patient have any problems  obtaining your medications?: No  Social/Family/Support Systems Patient Roles: Parent (Has 2 sons locally.) Contact Information: Lalla Laham - son Anticipated Caregiver: self and family Anticipated Caregiver's Contact Information: Yvone Neu - [phone] Ability/Limitations of Caregiver: Sons work.  Sister and brother around.  Sister may help after discharge.  May hire help from [facility] [employer] Caregiver Availability: Intermittent Discharge Plan Discussed with Primary Caregiver: Yes Is Caregiver In Agreement with Plan?: Yes Does Caregiver/Family have Issues with Lodging/Transportation while Pt is in Rehab?: No  Goals/Additional Needs Patient/Family Goal for Rehab: PT S, OT S/Min assist Expected length of stay: 15-23 days Cultural Considerations: None Dietary Needs: Regular with thin liquids Equipment Needs: TBD Pt/Family Agrees to Admission and willing to participate: Yes Program Orientation Provided & Reviewed with Pt/Caregiver Including Roles  & Responsibilities: Yes   Decrease burden of Care  through IP rehab admission: N/A   Possible need for SNF placement upon discharge: Yes, as patient lives alone.  If ADL management becomes more functional, then may not need SNF.  Patient and son feel she may need SNF even after a rehab stay.   Patient Condition: This patient's medical and functional status has changed since the consult dated: 07/19/13 in which the Rehabilitation Physician determined and documented that the patient's condition is appropriate for intensive rehabilitative care in an inpatient rehabilitation facility. See "History of Present Illness" (above) for medical update. Functional changes are: Currently requiring min Assist +2 110 ft RW. Patient's medical and functional status update has been discussed with the Rehabilitation physician and patient remains appropriate for inpatient rehabilitation. Will admit to inpatient rehab today.  Preadmission Screen Completed By:  Retta Diones, 07/22/2013 11:47 AM ______________________________________________________________________   Discussed status with Dr. Letta Pate on 07/22/13 at 1146 and received telephone approval for admission today.  Admission Coordinator:  Retta Diones, time1146/Date03/12/15

## 2013-07-22 NOTE — Progress Notes (Signed)
Rehab admissions - Bed available and will admit to acute inpatient rehab today.  Call me for questions.  #[phone]

## 2013-07-22 NOTE — Progress Notes (Signed)
Subjective: Patient reports "I feel like I'm doing better"  Objective: Vital signs in last 24 hours: Temp:  [97.3 F (36.3 C)-99.2 F (37.3 C)] 97.3 F (36.3 C) (03/12 0554) Pulse Rate:  [78-98] 78 (03/12 0554) Resp:  [16-18] 16 (03/12 0554) BP: (131-149)/(44-69) 147/55 mmHg (03/12 0554) SpO2:  [90 %-97 %] 95 % (03/12 0554)  Intake/Output from previous day: 03/11 0701 - 03/12 0700 In: 480 [P.O.:480] Out: -  Intake/Output this shift:    Walking to chair with assistance, smiling.  Improving strength. Pt denies pain. Reports no dysphagia, only "lump" in throat with swallowing - reassured. Unable to wear soft collar effectively (collar size vs. body habitus).  Lab Results: No results found for this basename: WBC, HGB, HCT, PLT,  in the last 72 hours BMET No results found for this basename: NA, K, CL, CO2, GLUCOSE, BUN, CREATININE, CALCIUM,  in the last 72 hours  Studies/Results: No results found.  Assessment/Plan: Improving   LOS: 7 days  Awaiting CIR bed   Verdis Prime 07/22/2013, 7:52 AM

## 2013-07-23 ENCOUNTER — Inpatient Hospital Stay (HOSPITAL_COMMUNITY): Payer: Medicare Other

## 2013-07-23 ENCOUNTER — Inpatient Hospital Stay (HOSPITAL_COMMUNITY): Payer: Medicare Other | Admitting: Physical Therapy

## 2013-07-23 ENCOUNTER — Inpatient Hospital Stay (HOSPITAL_COMMUNITY): Payer: Medicare Other | Admitting: *Deleted

## 2013-07-23 DIAGNOSIS — G8250 Quadriplegia, unspecified: Secondary | ICD-10-CM

## 2013-07-23 DIAGNOSIS — G061 Intraspinal abscess and granuloma: Secondary | ICD-10-CM

## 2013-07-23 LAB — URINALYSIS, ROUTINE W REFLEX MICROSCOPIC
Bilirubin Urine: NEGATIVE
Glucose, UA: NEGATIVE mg/dL
HGB URINE DIPSTICK: NEGATIVE
Ketones, ur: NEGATIVE mg/dL
LEUKOCYTES UA: NEGATIVE
NITRITE: NEGATIVE
PROTEIN: NEGATIVE mg/dL
SPECIFIC GRAVITY, URINE: 1.022 (ref 1.005–1.030)
Urobilinogen, UA: 0.2 mg/dL (ref 0.0–1.0)
pH: 6.5 (ref 5.0–8.0)

## 2013-07-23 LAB — CBC WITH DIFFERENTIAL/PLATELET
BASOS PCT: 0 % (ref 0–1)
Basophils Absolute: 0 10*3/uL (ref 0.0–0.1)
Eosinophils Absolute: 0.4 10*3/uL (ref 0.0–0.7)
Eosinophils Relative: 3 % (ref 0–5)
HEMATOCRIT: 33.1 % — AB (ref 36.0–46.0)
HEMOGLOBIN: 10.3 g/dL — AB (ref 12.0–15.0)
LYMPHS ABS: 2.7 10*3/uL (ref 0.7–4.0)
Lymphocytes Relative: 18 % (ref 12–46)
MCH: 30.4 pg (ref 26.0–34.0)
MCHC: 31.1 g/dL (ref 30.0–36.0)
MCV: 97.6 fL (ref 78.0–100.0)
MONO ABS: 0.9 10*3/uL (ref 0.1–1.0)
MONOS PCT: 6 % (ref 3–12)
Neutro Abs: 11.1 10*3/uL — ABNORMAL HIGH (ref 1.7–7.7)
Neutrophils Relative %: 74 % (ref 43–77)
Platelets: 278 10*3/uL (ref 150–400)
RBC: 3.39 MIL/uL — ABNORMAL LOW (ref 3.87–5.11)
RDW: 14.4 % (ref 11.5–15.5)
WBC: 15.1 10*3/uL — ABNORMAL HIGH (ref 4.0–10.5)

## 2013-07-23 LAB — COMPREHENSIVE METABOLIC PANEL
ALT: 18 U/L (ref 0–35)
AST: 19 U/L (ref 0–37)
Albumin: 2.3 g/dL — ABNORMAL LOW (ref 3.5–5.2)
Alkaline Phosphatase: 57 U/L (ref 39–117)
BILIRUBIN TOTAL: 0.2 mg/dL — AB (ref 0.3–1.2)
BUN: 15 mg/dL (ref 6–23)
CHLORIDE: 99 meq/L (ref 96–112)
CO2: 29 mEq/L (ref 19–32)
CREATININE: 0.47 mg/dL — AB (ref 0.50–1.10)
Calcium: 8.3 mg/dL — ABNORMAL LOW (ref 8.4–10.5)
GFR calc non Af Amer: >90 mL/min (ref >90)
GLUCOSE: 120 mg/dL — AB (ref 70–99)
Potassium: 4.3 mEq/L (ref 3.7–5.3)
Sodium: 141 mEq/L (ref 137–147)
Total Protein: 6 g/dL (ref 6.0–8.3)

## 2013-07-23 LAB — HEPATITIS C ANTIBODY (REFLEX): HCV Ab: NEGATIVE

## 2013-07-23 LAB — C-REACTIVE PROTEIN: CRP: 11.6 mg/dL — AB (ref <0.60)

## 2013-07-23 MED ORDER — SODIUM CHLORIDE 0.9 % IJ SOLN
10.0000 mL | Freq: Two times a day (BID) | INTRAMUSCULAR | Status: DC
  Administered 2013-07-23 – 2013-07-24 (×2): 10 mL

## 2013-07-23 MED ORDER — SODIUM CHLORIDE 0.9 % IJ SOLN
10.0000 mL | INTRAMUSCULAR | Status: DC | PRN
  Administered 2013-07-23 – 2013-07-27 (×7): 10 mL

## 2013-07-23 MED ORDER — AMITRIPTYLINE HCL 10 MG PO TABS
10.0000 mg | ORAL_TABLET | Freq: Every day | ORAL | Status: DC
  Administered 2013-07-23 – 2013-07-27 (×5): 10 mg via ORAL
  Filled 2013-07-23 (×7): qty 1

## 2013-07-23 NOTE — Evaluation (Signed)
Occupational Therapy Assessment and Plan  Patient Details  Name: [name] MRN: [id number] Date of Birth: 1935/01/18  OT Diagnosis: muscle weakness (generalized) and quadriparesis (secondary to central cord syndrome) Rehab Potential: Rehab Potential: Good ELOS: 10-14 days   Today's Date: 07/23/2013 Time: 9038-3338 Time Calculation (min): 65 min  Problem List:  Patient Active Problem List   Diagnosis Date Noted  . Epidural abscess 07/22/2013  . Spinal epidural abscess 07/16/2013  . Acute respiratory failure 07/16/2013  . OSA on CPAP 07/16/2013    Past Medical History:  Past Medical History  Diagnosis Date  . Hypertension   . Asthma   . Cancer     Skin Cancer  . Sleep apnea   . COPD (chronic obstructive pulmonary disease)    Past Surgical History:  Past Surgical History  Procedure Laterality Date  . Gastric bypass    . Anterior cervical decomp/discectomy fusion N/A 07/16/2013    Procedure: ANTERIOR CERVICAL DECOMPRESSION/DISCECTOMY FUSION mutlipleLEVELS C4-7;  Surgeon: Erline Levine, MD;  Location: [facility];  Service: Neurosurgery;  Laterality: N/A;    Assessment & Plan Clinical Impression: Patient is a 79 y.o. right-handed female with history of hypertension, COPD. Admitted 07/16/2013 with progressive quadriparesis x3 days. Denied any bowel or bladder disturbances. Patient with low-grade fever 102.2 white blood cell count of 14,000. MRI and imaging revealed cervical epidural abscess. Underwent emergent anterior cervical decompression discectomy with fusion multilevel C4-C7 07/16/2013 per Dr. Vertell Limber. Decadron protocol is advised. Followup infectious disease with spinal abscess fluid growing and microaerophilic streptococci and maintained on ceftriaxone as well as vancomycin. Vancomycin discontinued 07/19/2013. Advise 6 weeks of ceftriaxone through April 16 with PICC line to be placed. Postoperative pain management. Physical and occupational therapy evaluations completed  07/17/2013 with recommendations for physical medicine rehabilitation consult. Patient will be admitted for comprehensive rehabilitation program.   Patient transferred to CIR on 07/22/2013.    Patient currently requires max with basic self-care skills secondary to muscle weakness.  Prior to hospitalization, patient could complete BADL with independent .  Patient will benefit from skilled intervention to increase independence with basic self-care skills prior to discharge home independently.  Anticipate patient will require minimal physical assistance and follow up home health.  OT - End of Session Activity Tolerance: Tolerates 30+ min activity with multiple rests Endurance Deficit: Yes OT Assessment Rehab Potential: Good Barriers to Discharge: Decreased caregiver support OT Patient demonstrates impairments in the following area(s): Balance;Endurance;Perception;Safety OT Basic ADL's Functional Problem(s): Eating;Grooming;Bathing;Dressing;Toileting OT Advanced ADL's Functional Problem(s): Simple Meal Preparation OT Transfers Functional Problem(s): Toilet;Tub/Shower OT Additional Impairment(s): Fuctional Use of Upper Extremity OT Plan OT Intensity: Minimum of 1-2 x/day, 45 to 90 minutes OT Frequency: 5 out of 7 days OT Duration/Estimated Length of Stay: 10-14 days OT Treatment/Interventions: Therapeutic Exercise;Therapeutic Activities;Self Care/advanced ADL retraining;UE/LE Strength taining/ROM;Discharge planning;Functional mobility training;Patient/family education;DME/adaptive equipment instruction;Neuromuscular re-education;Balance/vestibular training;UE/LE Coordination activities OT Self Feeding Anticipated Outcome(s): Mod I OT Basic Self-Care Anticipated Outcome(s): Min A OT Toileting Anticipated Outcome(s): Min A OT Bathroom Transfers Anticipated Outcome(s): Supervision OT Recommendation Equipment Recommended: To be determined Equipment Details: Shower chair, tub bench, grab  bars  Skilled Therapeutic Intervention 1:1 OT initial evaluation completed with focus on toilet transfer (BSC), endurance, [place] of bil hands, and standing balance.   Patient completed bathing and dressing at w/c level, sink side, completing sit <> stand 2 times with mod assist (lift) but was unable to control bladder function after standing and required urgent use of BSC 2 times, with accidental voiding of  urine each time prior to transfer to [facility].   Patient demonstrated good static standing balance during assisted lower body dressing however she requires close supervision and verbal cues to use countertop and armrests of w/c, as needed for improved safety when raising and lowering from w/c.  OT Evaluation Precautions/Restrictions  Precautions Precautions: Fall Restrictions Weight Bearing Restrictions: No  General Chart Reviewed: Yes Family/Caregiver Present: No  Vital Signs Therapy Vitals Temp: 97.7 F (36.5 C) Temp src: Oral Pulse Rate: 78 Resp: 18 BP: 147/79 mmHg Patient Position, if appropriate: Lying Oxygen Therapy SpO2: 94 % O2 Device: None (Room air)  Pain Pain Assessment Pain Assessment: No/denies pain Pain Score: 5  Pain Type: Surgical pain Pain Location: Neck Pain Orientation: Anterior Pain Descriptors / Indicators: Aching Pain Onset: On-going Pain Intervention(s): Repositioned;Rest  Home Living/Prior Functioning Home Living Available Help at Discharge: Family;Available 24 hours/day Type of Home: House Home Access: Stairs to enter [employer] of Steps: 3 Entrance Stairs-Rails: None Home Layout: One level Additional Comments: baseline patient uses no DME but has DME from bil knee surg in the past - shower chair  Lives With: Alone (with Dog) IADL History Homemaking Responsibilities: Yes Meal Prep Responsibility: Primary Laundry Responsibility: Primary Cleaning Responsibility: Primary Bill Paying/Finance Responsibility: Primary Shopping  Responsibility: Primary Child Care Responsibility: No Occupation: Retired Prior Function Level of Independence: Independent with basic ADLs  Able to [place]?: Yes Driving: Yes 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, hospital  Leisure: Hobbies-yes (Comment) Comments: Read, sew, cook - likes baking cakes and perfecting chili  ADL ADL ADL Comments: see FIM  Vision/Perception  Vision - History Baseline Vision: Wears glasses all the time Patient Visual Report: No change from baseline Vision - Assessment Eye Alignment: Within Functional Limits Vision Assessment: Vision not tested Perception Perception: Within Functional Limits Praxis Praxis: Intact   Cognition Arousal/Alertness: Awake/alert Orientation Level: Oriented X4 Attention: Alternating Alternating Attention: Appears intact Memory: Appears intact Awareness: Appears intact Problem Solving: Impaired Safety/Judgment: Impaired  Sensation Sensation Light Touch: Impaired Detail Light Touch Impaired Details: Impaired RUE;Impaired LUE Stereognosis: Appears Intact Hot/Cold: Appears Intact Proprioception: Appears Intact  Motor  Motor Motor: mild quadriparesis   Mobility  Bed Mobility Bed Mobility: Not assessed Transfers Sit to Stand: 3: Mod assist;4: Min assist Sit to Stand Details (indicate cue type and reason): up to mod assist with fatigue. Cues for UE placement Stand to Sit: 4: Min assist   Trunk/Postural Assessment  Cervical Assessment Cervical Assessment: Exceptions to [facility] Cervical Strength Overall Cervical Strength Comments: Guarded movements, restricted due to surgery Thoracic Assessment Thoracic Assessment: Within Functional Limits Postural Control Postural Control: Deficits on evaluation Righting Reactions: Impaired in standing Postural Limitations: Impaired in standing   Balance Balance Balance Assessed: Yes Static Standing Balance Static Standing - Balance Support: Bilateral upper  extremity supported Static Standing - Level of Assistance: 4: Min assist Dynamic Standing Balance Dynamic Standing - Balance Support: Bilateral upper extremity supported;Left upper extremity supported;Right upper extremity supported Dynamic Standing - Level of Assistance: 4: Min assist Dynamic Standing - Balance Activities: Lateral lean/weight shifting;Forward lean/weight shifting  Extremity/Trunk Assessment RUE Assessment RUE Assessment: Exceptions to [facility] RUE Strength RUE Overall Strength: Deficits (2+/5, grossly) Grip (lbs): 5 lbs Lateral Pinch: 1 lbs LUE Assessment LUE Assessment: Exceptions to [facility] LUE Strength LUE Overall Strength: Deficits (3-/5, grossly) Grip:  (lbs) 12 lbs Lateral Pinch: 3 lbs  FIM:  FIM - Eating Eating Activity: 5: Set-up assist for open containers FIM - Grooming Grooming Steps: Wash, rinse, dry face;[address],  rinse, dry hands;Brush, comb hair;Oral care, brush teeth, clean dentures Grooming: 5: Set-up assist to open containers FIM - Bathing Bathing Steps Patient Completed: Chest;Right Arm;Abdomen;Left Arm Bathing: 2: Max-Patient completes 3-4 73f10 parts or 25-49% FIM - Upper Body Dressing/Undressing Upper body dressing/undressing steps patient completed: Thread/unthread right bra strap;Thread/unthread left bra strap;Thread/unthread right sleeve of pullover shirt/dresss;Thread/unthread left sleeve of pullover shirt/dress Upper body dressing/undressing: 3: Mod-Patient completed 50-74% of tasks FIM - Lower Body Dressing/Undressing Lower body dressing/undressing: 1: Total-Patient completed less than 25% of tasks FIM - Toileting Toileting steps completed by patient: Adjust clothing prior to toileting Toileting: 2: Max-Patient completed 1 of 3 steps FIM - Bed/Chair Transfer Bed/Chair Transfer Assistive Devices: HOB elevated;Bed rails;Arm rests;Walker Bed/Chair Transfer: 3: Supine > Sit: Mod A (lifting assist/Pt. 50-74%/lift 2 legs;3: Bed > Chair or W/C: Mod A  (lift or lower assist) FIM - TRadio producerDevices: Bedside commode;Walker Toilet Transfers: 3-To toilet/BSC: Mod A (lift or lower assist);3-From toilet/BSC: Mod A (lift or lower assist) FIM - Tub/Shower Transfers Tub/shower Transfers: 0-Activity did not occur or was simulated   Refer to Care Plan for Long Term Goals  Recommendations for other services: None  Discharge Criteria: Patient will be discharged from OT if patient refuses treatment 3 consecutive times without medical reason, if treatment goals not met, if there is a change in medical status, if patient makes no progress towards goals or if patient is discharged from hospital.  The above assessment, treatment plan, treatment alternatives and goals were discussed and mutually agreed upon: by patient  Second session: Time: 1300-1345 Time Calculation (min):  45 min  Pain Assessment: No pain  Skilled Therapeutic Interventions: Therapeutic activity with focus on improved endurance, safety awareness, bil hand strengthening assessment (using Jamar Hand dynamometer), and functional transfers.   Patient received seated in her recliner with daughter-in-law present.   OT re-educated patient on safe use of DME and AE (w/c, walker, tub bench) and provided adjustment to w/c hand brake as needed for improved security during transfers.   With steadying assist to perform sit > stand and close supervision patient ambulated from recliner bathroom to practice tub bench transfer but required repeated verbal cues to maintain correct position in RW and to approach and contact tub bench prior to sitting.   Patient demo'd left knee buckling as she approached tub bench and required min assist to maintain standing balance.   Patient returned to recliner and required prolonged rest break due to fatigue.   Hand strength was assessed as insufficient for fall prevention.   Patient demo'd decreased alertness and was unable to sustain  attention at end of session, requesting assist back to bed to recover from fatigue.   Patient completed transfer to bed with min assist but required max assist to lift both legs and to perform bed mobility from edge of bed.  See FIM for current functional status  Therapy/Group: Individual Therapy  BLong Pine3/13/2015, 2:30 PM

## 2013-07-23 NOTE — Progress Notes (Signed)
Occupational Therapy Note  Patient Details  Name: CHARMA MOCARSKI MRN: [id number] Date of Birth: May 13, 1935 Today's Date: 07/23/2013  Time:  1600-1630  (30 min) Pain: back 4/10 Individual session   Engaged in UE AROM, strengthening, sit to stand and standing balance.  Pt received sitting in wc.  Performed BUE exercises, sit to stand, and spirogometer for 15 times at [phone] mm. Pt. Stood for 5-8 seconds with minimal assist before sitting.   Left pt in wc with call bell in reach.     Lisa Roca 07/23/2013, 4:34 PM

## 2013-07-23 NOTE — Progress Notes (Signed)
Subjective/Complaints: Overall doing fairly well. Able to sleep. Having neck pain and radiating, tingling pain down each arm. Hasn't moved bowels much. Emptying bladder "too much". A 12 point review of systems has been performed and if not noted above is otherwise negative.   Objective: Vital Signs: Blood pressure 147/79, pulse 78, temperature 97.7 F (36.5 C), temperature source Oral, resp. rate 18, height 5\' 3"  (1.6 m), SpO2 94.00%. No results found.  Recent Labs  07/23/13 0515  WBC 15.1*  HGB 10.3*  HCT 33.1*  PLT 278    Recent Labs  07/23/13 0515  NA 141  K 4.3  CL 99  GLUCOSE 120*  BUN 15  CREATININE 0.47*  CALCIUM 8.3*   CBG (last 3)  No results found for this basename: GLUCAP,  in the last 72 hours  Wt Readings from Last 3 Encounters:  07/20/13 79.561 kg (175 lb 6.4 oz)  07/20/13 79.561 kg (175 lb 6.4 oz)  01/22/11 115.35 kg (254 lb 4.8 oz)    Physical Exam:  Constitutional:  79 year old obese female  HENT:  Head: Normocephalic.  Eyes: EOM are normal.  Neck: Normal range of motion. Neck supple. No thyromegaly present.  Cardiovascular: Normal rate and regular rhythm.  Respiratory: Effort normal and breath sounds normal. No respiratory distress.  GI: Soft. Bowel sounds are normal. She exhibits no distension.  Neurological:  Patient is alert . She is oriented x3 and followed basic commands. RUE is 2+deltoid, 3 bicep, 3- R tricep, 3/5 wrist and HI.  LUE 3 deltoid, bicep, tricep, 3+/5 L wrist ext ,   3 minus bilateral hip flexors, 3+ right knee extensor, 4/5 left knee extensor, 5/5 bilateral ankle dorsiflexor plantar flexor. Sensation 1-/2 RUE, 1/2 LUE, 3+ bilateral bicep triceps and patellar reflexes, absent tricep reflexes, 1+Achilles reflexes  Skin:  Surgical site clean and dry no swelling or erythema  Psychiatric: She has a normal mood and affect. Her behavior is normal. Judgment and thought content normal. In good  spirits  Assessment/Plan: 1. Functional deficits secondary to cervical epidural abscess with subsequent central cord syndrome (pt s/p evacuation and fusion C4-7) which require 3+ hours per day of interdisciplinary therapy in a comprehensive inpatient rehab setting. Physiatrist is providing close team supervision and 24 hour management of active medical problems listed below. Physiatrist and rehab team continue to assess barriers to discharge/monitor patient progress toward functional and medical goals. FIM:                   Comprehension Comprehension Mode: Auditory Comprehension: 5-Follows basic conversation/direction: With no assist  Expression Expression Mode: Verbal Expression: 5-Expresses basic needs/ideas: With no assist  Social Interaction Social Interaction: 6-Interacts appropriately with others with medication or extra time (anti-anxiety, antidepressant).  Problem Solving Problem Solving: 6-Solves complex problems: With extra time  Memory Memory: 7-Complete Independence: No helper  Medical Problem List and Plan:  1. Cervical epidural abscess with tetraplegia status post decompression and fusion  2. DVT Prophylaxis/Anticoagulation: SCDs. Monitor for any signs of DVT  3. Pain Management/neuropathic pain: Neurontin 300 mg 3 times a day, Hydrocodone and Robaxin as needed.   -add low dose elavil at night as well to assist with neuropathic pain 3. Mood/insomnia: Trazodone 50 mg each bedtime  5. Neuropsych: This patient is capable of making decisions on her own behalf.  6. ID/microaerophilic streptococci. Ceftriaxone x6 weeks through April 16. Followup infectious disease as outpt  -leukocytosis (15k)--afebrile, continue follow clinically. Recheck cbc tomorrow  -  need urine collected 7. Hypertension. Lisinopril 5 mg daily, Lasix 20 mg daily. Monitor with increased mobility  8. Asthma. Dulera 2 puffs twice a day, Flonase daily. No complaints of shortness of breath.  Continue CPAP  9. Urinary frequency. Ditropan 2.5 mg twice a day.  Follow up urine pending 10. Obesity. History of gastric bypass  LOS (Days) 1 A FACE TO FACE EVALUATION WAS PERFORMED  SWARTZ,ZACHARY T 07/23/2013 7:47 AM

## 2013-07-23 NOTE — Progress Notes (Signed)
Social Work Assessment and Plan Social Work Assessment and Plan  Patient Details  Name: [name] MRN: [id number] Date of Birth: [birth year]  Today's Date: 07/23/2013  Problem List:  Patient Active Problem List   Diagnosis Date Noted  . Epidural abscess 07/22/2013  . Spinal epidural abscess 07/16/2013  . Acute respiratory failure 07/16/2013  . OSA on CPAP 07/16/2013   Past Medical History:  Past Medical History  Diagnosis Date  . Hypertension   . Asthma   . Cancer     Skin Cancer  . Sleep apnea   . COPD (chronic obstructive pulmonary disease)    Past Surgical History:  Past Surgical History  Procedure Laterality Date  . Gastric bypass    . Anterior cervical decomp/discectomy fusion N/A 07/16/2013    Procedure: ANTERIOR CERVICAL DECOMPRESSION/DISCECTOMY FUSION mutlipleLEVELS C4-7;  Surgeon: Erline Levine, MD;  Location: [facility];  Service: Neurosurgery;  Laterality: N/A;   Social History:  reports that she has never smoked. She does not have any smokeless tobacco history on file. She reports that she does not drink alcohol. Her drug history is not on file.  Family / Support Systems Marital Status: Widow/Widower Patient Roles: Parent Children: Ken-son  [phone] Other Supports: Ted-son  [phone]  [phone] Anticipated Caregiver: Sister can check in on, pt discusses hiring [facility] has had them before with husband Ability/Limitations of Caregiver: Currently does not have 24 hr care in place Caregiver Availability: Other (Comment) (Working on a plan) Family Dynamics: Close with son's and siblings.  She has been independent and cared for her ill husband until he passed away.  She is familiar with rehab process since here with husband year ago.    Social History Preferred language: English Religion: Non-Denominational Cultural Background: No issues Education: High School Read: Yes Write: Yes Employment Status: Retired Freight forwarder  Issues: No issues Guardian/Conservator: None-according to MD pt is capable with making her own decisions while here   Abuse/Neglect Physical Abuse: Denies Verbal Abuse: Denies Sexual Abuse: Denies Exploitation of patient/patient's resources: Denies Self-Neglect: Denies  Emotional Status Pt's affect, behavior adn adjustment status: Pt is motivated to improve and wants to be as independent as possible before she leaves here.  She realizes she has a ways to go.  She relies upon herslef to get through and her family whom she is close with. Recent Psychosocial Issues: Other medical issues Pyschiatric History: No history deferred depression due to tired form therapies and lunch was there.  Will monitor her coping and intervene if necessary.  Team to monitor also. Substance Abuse History: No issues  Patient / Family Perceptions, Expectations & Goals Pt/Family understanding of illness & functional limitations: Pt is able to explain her condition and son aware and both have spoken with MD regarding pt's condition and questions regarding her treatment plan.  Both pleased she is on the rehab unit since familiar with and will get much needed therapy.   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, etc Anticipated changes in roles/activities/participation: resume Pt/family expectations/goals: Pt states; ' I want to be able to do for myself and go home from here."  Son states; " We will do what we can but work."  [employer]: Other (Comment) ([employer] used before) Premorbid Home Care/DME Agencies: None Transportation available at discharge: family Resource referrals recommended: Support group (specify)  Discharge Planning Living Arrangements: [place]: Children;Other relatives;Friends/neighbors;Church/faith community Type of Residence: Private residence Insurance Resources: [employer] (specify)  (  [employer]icare) Financial Resources: Radio broadcast assistant Screen Referred: No Living Expenses: Own Money Management: Patient Does the patient have any problems obtaining your medications?: No Home Management: Self Patient/Family Preliminary Plans: Return home with some assist-[facility] [employer] and sister to check on her.  Discussed plan if 24 hr car eis needed.  Aware of NH option since has been to [facility] after [facility].  Will await progress and come up with a safe discharge plan. Social Work Anticipated Follow Up Needs: HH/OP;SNF;Support Group  Clinical Impression Pleasant talkative patient who is familiar with rehab process, since was here with her husband year ago, son is also a PT here.  May need 24 hr care upon discharge, at this time She does not have a plan in place.  Will work on a safe discharge plan, await team's evaluations.  Elease Hashimoto 07/23/2013, 12:59 PM

## 2013-07-23 NOTE — Care Management Note (Signed)
Inpatient [facility] Individual Statement of Services  Patient Name:  ANIKA SHORE  Date:  07/23/2013  Welcome to the [facility].  Our goal is to provide you with an individualized program based on your diagnosis and situation, designed to meet your specific needs.  With this comprehensive rehabilitation program, you will be expected to participate in at least 3 hours of rehabilitation therapies Monday-Friday, with modified therapy programming on the weekends.  Your rehabilitation program will include the following services:  Physical Therapy (PT), Occupational Therapy (OT), Speech Therapy (ST), 24 hour per day rehabilitation nursing, Therapeutic Recreaction (TR), Case Management (Social Worker), Rehabilitation Medicine, Nutrition Services and Pharmacy Services  Weekly team conferences will be held on Tuesday to discuss your progress.  Your Social Worker will talk with you frequently to get your input and to update you on team discussions.  Team conferences with you and your family in attendance may also be held.  Expected length of stay: 10-14 DAYS  Overall anticipated outcome: supervision/min level  Depending on your progress and recovery, your program may change. Your Social Worker will coordinate services and will keep you informed of any changes. Your Social Worker's name and contact numbers are listed  below.  The following services may also be recommended but are not provided by the [facility] will be made to provide these services after discharge if needed.  Arrangements include referral to agencies that provide these services.  Your insurance has been verified to be:  [employer] Your primary doctor is:    Pertinent information will be shared with your doctor and your insurance company.  Social Worker:   Lennart Pall, [place] or ([phone]   Information discussed with and copy given to patient by: Elease Hashimoto, 07/23/2013, 10:53 AM

## 2013-07-23 NOTE — Evaluation (Signed)
Physical Therapy Assessment and Plan  Patient Details  Name: [name] MRN: [id number] Date of Birth: 15-Apr-1935  PT Diagnosis: Abnormal posture, Abnormality of gait, Difficulty walking, Muscle weakness and Quadriparesis Rehab Potential: Good ELOS: 10-14 days   Today's Date: 07/23/2013 Time: 7829-5621 Time Calculation (min): 60 min  Problem List:  Patient Active Problem List   Diagnosis Date Noted  . Epidural abscess 07/22/2013  . Spinal epidural abscess 07/16/2013  . Acute respiratory failure 07/16/2013  . OSA on CPAP 07/16/2013    Past Medical History:  Past Medical History  Diagnosis Date  . Hypertension   . Asthma   . Cancer     Skin Cancer  . Sleep apnea   . COPD (chronic obstructive pulmonary disease)    Past Surgical History:  Past Surgical History  Procedure Laterality Date  . Gastric bypass    . Anterior cervical decomp/discectomy fusion N/A 07/16/2013    Procedure: ANTERIOR CERVICAL DECOMPRESSION/DISCECTOMY FUSION mutlipleLEVELS C4-7;  Surgeon: Erline Levine, MD;  Location: [facility];  Service: Neurosurgery;  Laterality: N/A;    Assessment & Plan Clinical Impression:Jamekia T Ralph is a 79 y.o. right-handed female with history of hypertension, COPD. Admitted 07/16/2013 with progressive quadriparesis x3 days. Denied any bowel or bladder disturbances. Patient with low-grade fever 102.2 white blood cell count of 14,000. MRI and imaging revealed cervical epidural abscess. Underwent emergent anterior cervical decompression discectomy with fusion multilevel C4-C7 07/16/2013 per Dr. Vertell Limber. Decadron protocol is advised. Followup infectious disease with spinal abscess fluid growing and microaerophilic streptococci and maintained on ceftriaxone as well as vancomycin. Vancomycin discontinued 07/19/2013. Advise 6 weeks of ceftriaxone through April 16 with PICC line to be placed. Patient transferred to CIR on 07/22/2013 .   Patient currently requires mod with mobility  secondary to muscle weakness and muscle paralysis, impaired timing and sequencing, unbalanced muscle activation and decreased coordination and decreased standing balance, decreased postural control and decreased balance strategies.  Prior to hospitalization, patient was independent  with mobility and lived with Alone (with Dog) in a House home.  Home access is 3Stairs to enter.  Patient will benefit from skilled PT intervention to maximize safe functional mobility, minimize fall risk and decrease caregiver burden for planned discharge home with intermittent assist vs. 24/7 supervision  Anticipate patient will benefit from follow up [facility] at discharge.  PT - End of Session Endurance Deficit: Yes PT Assessment Rehab Potential: Good Barriers to Discharge: Decreased caregiver support PT Patient demonstrates impairments in the following area(s): Balance;Endurance;Pain;Safety PT Transfers Functional Problem(s): Bed Mobility;Bed to Chair;Car;Furniture PT Locomotion Functional Problem(s): Ambulation;Wheelchair Mobility;Stairs PT Plan PT Intensity: Minimum of 1-2 x/day ,45 to 90 minutes PT Frequency: 5 out of 7 days PT Duration Estimated Length of Stay: 10-14 days PT Treatment/Interventions: Ambulation/gait training;Balance/vestibular training;Community reintegration;Discharge planning;Patient/family education;Therapeutic Exercise;Therapeutic Activities;Pain management;Neuromuscular re-education;Stair training;Wheelchair propulsion/positioning;Splinting/orthotics;Functional mobility training;Functional electrical stimulation;Skin care/wound management;UE/LE Coordination activities;UE/LE Strength taining/ROM;Psychosocial support;DME/adaptive equipment instruction PT Transfers Anticipated Outcome(s): Modified independent PT Locomotion Anticipated Outcome(s): supervision PT Recommendation Follow Up Recommendations: Home health PT;24 hour supervision/assistance (vs. intermediate supervision) Patient  destination: Home Equipment Recommended: To be determined  Skilled Therapeutic Intervention Session focused on safety with transfers, w/c set-up for transfers and discussion of D/C environment and assist available. During ambulation also worked on monitoring Rt LE fatigue and avoiding buckling of Rt LE.   PT Evaluation Precautions/Restrictions Restrictions Weight Bearing Restrictions: No General   Vital SignsTherapy Vitals Pulse Rate: 79 Resp: 18 Patient Position, if appropriate: Lying Oxygen Therapy SpO2: 93 % O2  Device: None (Room air) Pain Pain Assessment Pain Assessment: 0-10 Pain Score: 8  Pain Type: Surgical pain Pain Location: Neck Pain Radiating Towards: Hands and fingers Pain Descriptors / Indicators: Aching;Other (Comment) (cold) Pain Frequency: Intermittent Pain Onset: Gradual Patients Stated Pain Goal: 4 Pain Intervention(s): Medication (See eMAR) Home Living/Prior Functioning Home Living Available Help at Discharge:  (has family but really wants to try without them) Type of Home: House Home Access: Stairs to enter [employer] of Steps: 3 Entrance Stairs-Rails: Left;Right Home Layout: One level Additional Comments: baseline patient uses no DME but has DME from bil knee surg in the past - shower chair  Lives With: Alone (with Dog) Prior Function Level of Independence: Independent with basic ADLs  Able to Take Stairs?: Yes Driving: Yes 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, hospital  Leisure: Hobbies-yes (Comment) Comments: Read, sew, cook - likes baking cakes and perfecting chili  Cognition Arousal/Alertness: Awake/alert Orientation Level: Oriented X4 Attention: Alternating Alternating Attention: Appears intact Memory: Appears intact Awareness: Appears intact Problem Solving: Impaired Safety/Judgment: Impaired Sensation Sensation Light Touch: Appears Intact Proprioception: Appears Intact Coordination Gross Motor Movements are  Fluid and Coordinated: Yes Fine Motor Movements are Fluid and Coordinated: No Motor     Mobility Bed Mobility Bed Mobility: Not assessed Transfers Transfers: Yes Sit to Stand: 3: Mod assist;4: Min assist Sit to Stand Details (indicate cue type and reason): up to mod assist with fatigue. Cues for UE placement Stand to Sit: 4: Min assist Stand Pivot Transfers: 3: Mod assist Stand Pivot Transfer Details (indicate cue type and reason): Up to mod assist due to weakness and to assist with problem solving. Cues for sequencing of RW>  Locomotion  Ambulation Ambulation: Yes Ambulation/Gait Assistance: 4: Min assist (+2 for chair to follow) Ambulation Distance (Feet): 30 Feet Assistive device: Rolling walker Ambulation/Gait Assistance Details: Limited by poor endurance and Rt LE weakness "I better sit now" Gait Gait: Yes Gait Pattern: Impaired Gait Pattern: Step-to pattern;Trunk flexed (decreased foot clearance bil. LEs. Rt LE instability during stance) Stairs / Additional Locomotion Stairs: Yes Stairs Assistance: 1: +2 Total assist Stairs Assistance Details (indicate cue type and reason): +2 for safety overall mod assist with bil railings Stair Management Technique: Two rails;Step to pattern;Forwards Number of Stairs: 2 Architect: Yes Wheelchair Assistance: 4: Energy manager: Both upper extremities;Both lower extermities Wheelchair Parts Management: Needs assistance Distance: 38'  Trunk/Postural Assessment  Cervical Assessment Cervical Assessment: Exceptions to [facility] Cervical Strength Overall Cervical Strength Comments: Guarded movements, restricted due to surgery Thoracic Assessment Thoracic Assessment: Within Functional Limits Postural Control Postural Control: Deficits on evaluation Righting Reactions: Impaired in standing Postural Limitations: Impaired in standing  Balance Balance Balance Assessed: Yes Static Standing  Balance Static Standing - Balance Support: Bilateral upper extremity supported Static Standing - Level of Assistance: 4: Min assist Dynamic Standing Balance Dynamic Standing - Balance Support: Bilateral upper extremity supported;Left upper extremity supported;Right upper extremity supported Dynamic Standing - Level of Assistance: 4: Min assist Dynamic Standing - Balance Activities: Lateral lean/weight shifting;Forward lean/weight shifting Extremity Assessment      RLE Assessment RLE Assessment: Exceptions to [facility] RLE AROM (degrees) RLE Overall AROM Comments: Limited by body habitus but does not appear to have musculoskeletal limitations RLE Strength Right Hip Flexion: 2+/5 Right Knee Flexion: 3-/5 Right Knee Extension: 3/5 Right Ankle Dorsiflexion: 3/5 LLE Assessment LLE Assessment: Exceptions to [facility] LLE Strength Left Hip Flexion: 2+/5 Left Knee Flexion: 3-/5 Left Knee Extension: 3/5 Left Ankle Dorsiflexion:  3/5  FIM:  FIM - Locomotion: Wheelchair Distance: 35' FIM - Locomotion: Ambulation Ambulation/Gait Assistance: 4: Min assist   Refer to Care Plan for Long Term Goals  Recommendations for other services: None  Discharge Criteria: Patient will be discharged from PT if patient refuses treatment 3 consecutive times without medical reason, if treatment goals not met, if there is a change in medical status, if patient makes no progress towards goals or if patient is discharged from hospital.  The above assessment, treatment plan, treatment alternatives and goals were discussed and mutually agreed upon: by patient  [name] 07/23/2013, 12:48 PM

## 2013-07-23 NOTE — Progress Notes (Signed)
Patient information reviewed and entered into eRehab system by Dashawn Bartnick, RN, CRRN, PPS Coordinator.  Information including medical coding and functional independence measure will be reviewed and updated through discharge.     Per nursing patient was given "Data Collection Information Summary for Patients in Inpatient Rehabilitation Facilities with attached "Privacy Act Statement-Health Care Records" upon admission.  

## 2013-07-23 NOTE — Discharge Summary (Signed)
Physician Discharge Summary  Patient ID: [name] MRN: [id number] DOB/AGE: 01/03/1935 79 y.o.  Admit date: 07/22/2013 Discharge date: 07/22/2013  Admission Diagnoses: epidural abscess, quadriparesis     Discharge Diagnoses: epidural abscess, quadriparesis s/p ANTERIOR CERVICAL DECOMPRESSION/DISCECTOMY FUSION mutlipleLEVELS C4-7 (N/A) with allograft, plate C 45, C 56, C 67 levels with plating C 4-7 levels   Active Problems:   Epidural abscess   Discharged Condition: good  Hospital Course: [name] was admitted through the ED after a three day deterioration in motor function, which rapidly deteriorated further. She was taken emergently to surgery for  decompression and fusion at C4-5, C5-6, C6-7.  She has improved impressively. I.D. was consulted and abscess fluid was found to be growing microaerophilic streptococci. IVAB continue with plan for 6wks duration. She will transfer to [place] for continued therapies.   Consults: Infectious Disease, Critical Care  Significant Diagnostic Studies: radiology: X-Ray: intra-operative  Treatments: surgery: ANTERIOR CERVICAL DECOMPRESSION/DISCECTOMY FUSION mutlipleLEVELS C4-7 (N/A) with allograft, plate C 45, C 56, C 67 levels with plating C 4-7 levels    Discharge Exam: Blood pressure 147/79, pulse 79, temperature 97.7 F (36.5 C), temperature source Oral, resp. rate 18, height 5\' 3"  (1.6 m), SpO2 93.00%. 07-22-13 --Walking to chair with assistance, smiling.  Improving strength. Pt denies pain. Reports no dysphagia, only "lump" in throat with swallowing - reassured. Unable to wear soft collar effectively (collar size vs. body habitus).    Disposition: Discharge to [facility].    Future Appointments Provider Department Dept Phone   07/23/2013 1:00 PM Salome Spotted, [place] [facility] [phone]   07/23/2013 4:00 PM [place] [facility]  [phone]       Medication List    ASK your doctor about these medications       acetaminophen 650 MG CR tablet  Commonly known as:  TYLENOL  Take 1,300 mg by mouth every 8 (eight) hours as needed for pain.     aspirin 81 MG tablet  Take 81 mg by mouth daily.     calcium carbonate 1250 MG tablet  Commonly known as:  OS-CAL - dosed in mg of elemental calcium  Take 2 tablets by mouth daily.     clotrimazole-betamethasone cream  Commonly known as:  LOTRISONE     cyanocobalamin 500 MCG tablet  Take 500 mcg by mouth daily.     ferrous fumarate 325 (106 FE) MG Tabs tablet  Commonly known as:  HEMOCYTE - 106 mg FE  Take 1 tablet by mouth 3 (three) times a week.     fluticasone 50 MCG/ACT nasal spray  Commonly known as:  FLONASE  Place 2 sprays into both nostrils daily.     Fluticasone-Salmeterol 250-50 MCG/DOSE Aepb  Commonly known as:  ADVAIR  Inhale 1 puff into the lungs 2 (two) times daily.     furosemide 20 MG tablet  Commonly known as:  LASIX  Take 20 mg by mouth daily.     HYDROcodone-acetaminophen 5-325 MG per tablet  Commonly known as:  NORCO/VICODIN  Take 1-2 tablets by mouth every 4 (four) hours as needed for moderate pain.     ibuprofen 200 MG tablet  Commonly known as:  ADVIL,MOTRIN  Take 400 mg by mouth every 6 (six) hours as needed.     lisinopril 5 MG tablet  Commonly known as:  PRINIVIL,ZESTRIL  Take 5 mg by mouth daily.  loratadine 10 MG tablet  Commonly known as:  CLARITIN  Take 10 mg by mouth daily.     multivitamin with minerals tablet  Take 1 tablet by mouth daily.     naproxen sodium 220 MG tablet  Commonly known as:  ANAPROX  Take 220 mg by mouth 2 (two) times daily with a meal.     oxybutynin 5 MG tablet  Commonly known as:  DITROPAN  Take 2.5 mg by mouth 2 (two) times daily.     predniSONE 10 MG tablet  Commonly known as:  DELTASONE  Take 40 mg by mouth daily. Pt taking 40 mg a day for 3 days, then 30 mg a day for 3 days,  20 mg a day for 3 days, 10 mg a day for 3 days     PREVNAR 13 Susp injection  Generic drug:  pneumococcal 13-valent conjugate vaccine  Inject 0.5 mLs into the muscle once.     traZODone 50 MG tablet  Commonly known as:  DESYREL  Take 50 mg by mouth at bedtime.     Vitamin D 2000 UNITS Caps  Take 2,000 Units by mouth daily.         Signed: Late Entry-07-23-13 for 07-22-13 Verdis Prime 07/23/2013, 10:41 AM

## 2013-07-24 ENCOUNTER — Inpatient Hospital Stay (HOSPITAL_COMMUNITY): Payer: Medicare Other | Admitting: *Deleted

## 2013-07-24 ENCOUNTER — Inpatient Hospital Stay (HOSPITAL_COMMUNITY): Payer: Medicare Other | Admitting: Physical Therapy

## 2013-07-24 DIAGNOSIS — A491 Streptococcal infection, unspecified site: Secondary | ICD-10-CM

## 2013-07-24 DIAGNOSIS — G4733 Obstructive sleep apnea (adult) (pediatric): Secondary | ICD-10-CM

## 2013-07-24 DIAGNOSIS — G062 Extradural and subdural abscess, unspecified: Secondary | ICD-10-CM

## 2013-07-24 LAB — CBC
HCT: 32.3 % — ABNORMAL LOW (ref 36.0–46.0)
HEMOGLOBIN: 10.3 g/dL — AB (ref 12.0–15.0)
MCH: 30.7 pg (ref 26.0–34.0)
MCHC: 31.9 g/dL (ref 30.0–36.0)
MCV: 96.4 fL (ref 78.0–100.0)
Platelets: 294 10*3/uL (ref 150–400)
RBC: 3.35 MIL/uL — AB (ref 3.87–5.11)
RDW: 14.4 % (ref 11.5–15.5)
WBC: 16.7 10*3/uL — AB (ref 4.0–10.5)

## 2013-07-24 LAB — URINE CULTURE: Colony Count: 60000

## 2013-07-24 MED ORDER — SODIUM CHLORIDE 0.9 % IJ SOLN: 3.0000 mL | INTRAMUSCULAR | Status: DC | PRN

## 2013-07-24 MED ORDER — LINACLOTIDE 145 MCG PO CAPS
145.0000 ug | ORAL_CAPSULE | Freq: Every day | ORAL | Status: DC
  Administered 2013-07-24 – 2013-07-27 (×4): 145 ug via ORAL
  Filled 2013-07-24 (×6): qty 1

## 2013-07-24 NOTE — Progress Notes (Signed)
Physical Therapy Session Note  Patient Details  Name: [name] MRN: [id number] Date of Birth: [birth year]  Today's Date: 07/24/2013 Time: 8756-4332 Time Calculation (min): 30 min  Short Term Goals: Week 1:  PT Short Term Goal 1 (Week 1): Pt will transfer bed<>chair with supervision PT Short Term Goal 2 (Week 1): Pt will ambulation x 100' with supervision PT Short Term Goal 3 (Week 1): Pt will propel w/c 58' with modified independence  Skilled Therapeutic Interventions/Progress Updates:  Pt was seen bedside in the pm. Pt propelled w/c about 40 feet with B UEs and S with verbal cues. Pt transferred sit to stand x2 with mod A and verbal cues. Applied thera band to R wheel to increase grip on wheel. Pt propelled w/c about 60 feet with B UEs and S. Pt transferred edge of bed to supine with mod A and verbal cues. Pt transferred edge of bed to supine with mod A.    Therapy Documentation Precautions:  Precautions Precautions: Fall Restrictions Weight Bearing Restrictions: No General:   Pain: No c/o pain.   See FIM for current functional status  Therapy/Group: Individual Therapy  Dub Amis 07/24/2013, 3:22 PM

## 2013-07-24 NOTE — Progress Notes (Signed)
[name] is a 79 y.o. female [birth year] [id number]  Subjective: No new complaints. No new problems: cont to have constipation. Slept well. Feeling OK.  Objective: Vital signs in last 24 hours: Temp:  [98.2 F (36.8 C)] 98.2 F (36.8 C) (03/14 0523) Pulse Rate:  [77] 77 (03/14 0523) Resp:  [19] 19 (03/14 0523) BP: (141)/(81) 141/81 mmHg (03/14 0523) SpO2:  [90 %] 90 % (03/14 0523) Weight change:  Last BM Date: 07/22/13  Intake/Output from previous day: 03/13 0701 - 03/14 0700 In: 600 [P.O.:600] Out: 250 [Urine:250] Last cbgs: CBG (last 3)  No results found for this basename: GLUCAP,  in the last 72 hours   Physical Exam General: No apparent distress   HEENT: not dry Lungs: Normal effort. Lungs clear to auscultation, no crackles or wheezes. Cardiovascular: Regular rate and rhythm, no edema Abdomen: S/NT/ND; BS(+) Musculoskeletal:  unchanged Neurological: No new neurological deficits Wounds: N/A    Skin: clear  Aging changes Mental state: Alert, oriented, cooperative    Lab Results: BMET    Component Value Date/Time   NA 141 07/23/2013 0515   K 4.3 07/23/2013 0515   CL 99 07/23/2013 0515   CO2 29 07/23/2013 0515   GLUCOSE 120* 07/23/2013 0515   BUN 15 07/23/2013 0515   CREATININE 0.47* 07/23/2013 0515   CALCIUM 8.3* 07/23/2013 0515   GFRNONAA >90 07/23/2013 0515   GFRAA >90 07/23/2013 0515   CBC    Component Value Date/Time   WBC 16.7* 07/24/2013 0500   RBC 3.35* 07/24/2013 0500   HGB 10.3* 07/24/2013 0500   HCT 32.3* 07/24/2013 0500   PLT 294 07/24/2013 0500   MCV 96.4 07/24/2013 0500   MCH 30.7 07/24/2013 0500   MCHC 31.9 07/24/2013 0500   RDW 14.4 07/24/2013 0500   LYMPHSABS 2.7 07/23/2013 0515   MONOABS 0.9 07/23/2013 0515   EOSABS 0.4 07/23/2013 0515   BASOSABS 0.0 07/23/2013 0515    Studies/Results: No results found.  Medications: I have reviewed the patient's current medications.  Assessment/Plan:  1. Cervical epidural abscess with tetraplegia  status post decompression and fusion  2. DVT Prophylaxis/Anticoagulation: SCDs. Monitor for any signs of DVT  3. Pain Management/neuropathic pain: Neurontin 300 mg 3 times a day, Hydrocodone and Robaxin as needed.  -add low dose elavil at night as well to assist with neuropathic pain  3. Mood/insomnia: Trazodone 50 mg each bedtime  5. Neuropsych: This patient is capable of making decisions on her own behalf.  6. ID/microaerophilic streptococci. Ceftriaxone x6 weeks through April 16. Followup infectious disease as outpt  -leukocytosis (15k)--afebrile, continue follow clinically. Recheck cbc on Monday -need urine collected  7. Hypertension. Lisinopril 5 mg daily, Lasix 20 mg daily. Monitor with increased mobility  8. Asthma. Dulera 2 puffs twice a day, Flonase daily. No complaints of shortness of breath. Continue CPAP  9. Urinary frequency. Ditropan 2.5 mg twice a day. Follow up urine pending  10. Obesity. History of gastric bypass  11. Constipation. See Rx       Length of stay, days: 2  Walker Kehr , MD 07/24/2013, 2:52 PM

## 2013-07-24 NOTE — Progress Notes (Signed)
Occupational Therapy Note   Patient Details  Name: [name] MRN: [id number] Date of Birth: Jun 19, 1934 Today's Date: 07/24/2013  Time:  0930-1030  (60 min)  1st session Pain:  Upper back  5/10 Individual session    1st session:  Pt. Sitting in wc upon OT arrival.  Agreed to take shower in shower stall.  Pt. Assisted with gathering clothes and picking out what to wear.  Assisted pt with rolling wc into shower area.  Transferred from wc to shower bench with mod assist.  Had difficulty with moving in the transverse plane.  Pt needed assist with sit to stand (min a plus grab bar) and total assist for washing peri care while holding with BUE.  Transferred back to wc where pt completed dressing.   Focus of treatment was on transfers, sit to stand, standing tolerance, and balance.   Pt. Mildly confused about sequence of morning events.     Time:  9323-5573  (45 min)  2nd session Pain:  6/10 upper back Individual session  2nd session:  Pt eating lunch in wc.  Went over theraputty exercises with light tan putty.  Pt. Able to return demonstration with increased difficulty with RUE.  Pt is left hand dominant.   Ambulated with RW to bathroom with mod assist and increased difficulty with pivoting.  Arranged bathroom so pt can walk to  [facility] and not have to turn.   Pt needed assist with peri care and pants.   Used wc to come out of bathroom.  Left pt in wc with call bell in reach.  ocus of treatment was on transfers, sit to stand, standing tolerance, and balance.       Lisa Roca 07/24/2013, 7:09 PM

## 2013-07-24 NOTE — Progress Notes (Signed)
Physical Therapy Session Note  Patient Details  Name: [name] MRN: [id number] Date of Birth: 1934/06/09  Today's Date: 07/24/2013 Time: 0800-0900 Time Calculation (min): 60 min  Short Term Goals: Week 1:  PT Short Term Goal 1 (Week 1): Pt will transfer bed<>chair with supervision PT Short Term Goal 2 (Week 1): Pt will ambulation x 100' with supervision PT Short Term Goal 3 (Week 1): Pt will propel w/c 7' with modified independence  Skilled Therapeutic Interventions/Progress Updates:  Pt was seen bedside in the am. Pt transferred supine to edge of bed with side rail, head of bed elevated and mod A with verbal cues for technique. Pt sat on edge of bed about 15 minutes with S, while she ate breakfast and donned gown. Pt transferred edge of bed to w/c, stand pivot with mod A and verbal cues for technique. Pt transferred w/c to toilet with grab bars and mod A, sit to stand from toilet with mod A, toilet to w/c with grab bars and mod A. Pt transferred sit to stand from w/c with rolling walker and mod A with verbal cues. Pt ambulated with rolling walker 10 feet x 2 with rolling walker and mod A and verbal cues for technique. Pt was issued a different walker as per pt request.   Therapy Documentation Precautions:  Precautions Precautions: Fall Restrictions Weight Bearing Restrictions: No General:   Pain: Pt will generalized c/o stiffness no c/o pain as per patient.    Locomotion : Ambulation Ambulation/Gait Assistance: 3: Mod assist   See FIM for current functional status  Therapy/Group: Individual Therapy  Dub Amis 07/24/2013, 10:04 AM

## 2013-07-24 NOTE — Progress Notes (Signed)
Pt refuses to wear CPAP.  °

## 2013-07-24 NOTE — Progress Notes (Signed)
Pt declined to wear her home CPAP machine tonight

## 2013-07-25 DIAGNOSIS — M549 Dorsalgia, unspecified: Secondary | ICD-10-CM

## 2013-07-25 MED ORDER — DIAZEPAM 5 MG PO TABS
5.0000 mg | ORAL_TABLET | Freq: Two times a day (BID) | ORAL | Status: DC | PRN
  Administered 2013-07-25 – 2013-07-27 (×3): 5 mg via ORAL
  Filled 2013-07-25 (×3): qty 1

## 2013-07-25 NOTE — Progress Notes (Signed)
Pt tearful, yelling out this AM with spasms, neuropathic type pain/discomfort.  States spasms start at neck, through shoulders, arms.  Increased overall pain, tingling,burning sensation BUE's; last Vicodin given 2200 last evening.   Robaxin and Vicodin given at 0730, ineffective after 1 hour. Repositioned  In bed,  up to chair, back to bed....continues to yell out with spasms.  Dr. Alain Marion made aware, order for Valium 5 mg po BID PRN;  given, pt reports relief within 30 mins. Did not make pt. too drowsy. Monitor. Disimpacted last 2 mornings for large amts hard stool; note for MD.

## 2013-07-25 NOTE — Progress Notes (Signed)
[name] is a 79 y.o. female 18-Jan-1935 [id number]  Subjective: C/o back spasms this am. No new problems: cont to have constipation. Slept well. Feeling OK.  Objective: Vital signs in last 24 hours: Temp:  [97.8 F (36.6 C)-98.1 F (36.7 C)] 98.1 F (36.7 C) (03/15 0539) Pulse Rate:  [78-83] 78 (03/15 0539) Resp:  [18-20] 18 (03/15 0539) BP: (126-138)/(63-76) 138/76 mmHg (03/15 0539) SpO2:  [93 %-95 %] 95 % (03/15 0539) Weight change:  Last BM Date: 07/24/13 (disimpacted large amt. hard stool)  Intake/Output from previous day: 03/14 0701 - 03/15 0700 In: 720 [P.O.:720] Out: -  Last cbgs: CBG (last 3)  No results found for this basename: GLUCAP,  in the last 72 hours   Physical Exam General: No apparent distress   HEENT: not dry Lungs: Normal effort. Lungs clear to auscultation, no crackles or wheezes. Cardiovascular: Regular rate and rhythm, no edema Abdomen: S/NT/ND; BS(+) Musculoskeletal:  unchanged Neurological: No new neurological deficits Wounds: N/A    Skin: clear  Aging changes Mental state: Alert, oriented, cooperative    Lab Results: BMET    Component Value Date/Time   NA 141 07/23/2013 0515   K 4.3 07/23/2013 0515   CL 99 07/23/2013 0515   CO2 29 07/23/2013 0515   GLUCOSE 120* 07/23/2013 0515   BUN 15 07/23/2013 0515   CREATININE 0.47* 07/23/2013 0515   CALCIUM 8.3* 07/23/2013 0515   GFRNONAA >90 07/23/2013 0515   GFRAA >90 07/23/2013 0515   CBC    Component Value Date/Time   WBC 16.7* 07/24/2013 0500   RBC 3.35* 07/24/2013 0500   HGB 10.3* 07/24/2013 0500   HCT 32.3* 07/24/2013 0500   PLT 294 07/24/2013 0500   MCV 96.4 07/24/2013 0500   MCH 30.7 07/24/2013 0500   MCHC 31.9 07/24/2013 0500   RDW 14.4 07/24/2013 0500   LYMPHSABS 2.7 07/23/2013 0515   MONOABS 0.9 07/23/2013 0515   EOSABS 0.4 07/23/2013 0515   BASOSABS 0.0 07/23/2013 0515    Studies/Results: No results found.  Medications: I have reviewed the patient's current  medications.  Assessment/Plan:  1. Cervical epidural abscess with tetraplegia status post decompression and fusion. Diazepam prn spasms. 2. DVT Prophylaxis/Anticoagulation: SCDs. Monitor for any signs of DVT  3. Pain Management/neuropathic pain: Neurontin 300 mg 3 times a day, Hydrocodone and Robaxin as needed.  -add low dose elavil at night as well to assist with neuropathic pain  3. Mood/insomnia: Trazodone 50 mg each bedtime  5. Neuropsych: This patient is capable of making decisions on her own behalf.  6. ID/microaerophilic streptococci. Ceftriaxone x6 weeks through April 16. Followup infectious disease as outpt  -leukocytosis (15k)--afebrile, continue follow clinically. Recheck cbc on Monday -need urine collected  7. Hypertension. Lisinopril 5 mg daily, Lasix 20 mg daily. Monitor with increased mobility  8. Asthma. Dulera 2 puffs twice a day, Flonase daily. No complaints of shortness of breath. Continue CPAP  9. Urinary frequency. Ditropan 2.5 mg twice a day. Follow up urine pending  10. Obesity. History of gastric bypass  11. Constipation. See Rx       Length of stay, days: 3  Walker Kehr , MD 07/25/2013, 2:22 PM

## 2013-07-26 ENCOUNTER — Inpatient Hospital Stay (HOSPITAL_COMMUNITY): Payer: Medicare Other

## 2013-07-26 ENCOUNTER — Inpatient Hospital Stay (HOSPITAL_COMMUNITY): Payer: Medicare Other | Admitting: Physical Therapy

## 2013-07-26 LAB — CBC WITH DIFFERENTIAL/PLATELET
Basophils Absolute: 0 10*3/uL (ref 0.0–0.1)
Basophils Relative: 0 % (ref 0–1)
EOS PCT: 2 % (ref 0–5)
Eosinophils Absolute: 0.2 10*3/uL (ref 0.0–0.7)
HEMATOCRIT: 33.7 % — AB (ref 36.0–46.0)
Hemoglobin: 10.8 g/dL — ABNORMAL LOW (ref 12.0–15.0)
LYMPHS PCT: 17 % (ref 12–46)
Lymphs Abs: 2.1 10*3/uL (ref 0.7–4.0)
MCH: 30.9 pg (ref 26.0–34.0)
MCHC: 32 g/dL (ref 30.0–36.0)
MCV: 96.6 fL (ref 78.0–100.0)
MONO ABS: 0.8 10*3/uL (ref 0.1–1.0)
Monocytes Relative: 7 % (ref 3–12)
Neutro Abs: 9.1 10*3/uL — ABNORMAL HIGH (ref 1.7–7.7)
Neutrophils Relative %: 75 % (ref 43–77)
Platelets: 342 10*3/uL (ref 150–400)
RBC: 3.49 MIL/uL — AB (ref 3.87–5.11)
RDW: 14 % (ref 11.5–15.5)
WBC: 12.2 10*3/uL — AB (ref 4.0–10.5)

## 2013-07-26 LAB — HIV-1 RNA QUANT-NO REFLEX-BLD: HIV-1 RNA Quant, Log: <1.3 {Log} (ref <1.30)

## 2013-07-26 MED ORDER — GADOBENATE DIMEGLUMINE 529 MG/ML IV SOLN
15.0000 mL | Freq: Once | INTRAVENOUS | Status: AC | PRN
  Administered 2013-07-26: 15 mL via INTRAVENOUS

## 2013-07-26 MED ORDER — FLUCONAZOLE 100 MG PO TABS
100.0000 mg | ORAL_TABLET | Freq: Every day | ORAL | Status: DC
  Administered 2013-07-26 – 2013-07-27 (×2): 100 mg via ORAL
  Filled 2013-07-26 (×4): qty 1

## 2013-07-26 MED ORDER — LIDOCAINE HCL 2 % EX GEL
CUTANEOUS | Status: DC | PRN
  Filled 2013-07-26: qty 5

## 2013-07-26 MED ORDER — HYDROCODONE-ACETAMINOPHEN 10-325 MG PO TABS
1.0000 | ORAL_TABLET | Freq: Two times a day (BID) | ORAL | Status: DC
  Administered 2013-07-27 (×2): 1 via ORAL
  Filled 2013-07-26 (×2): qty 1

## 2013-07-26 NOTE — Progress Notes (Signed)
Occupational Therapy Session Note  Patient Details  Name: ZOI DEVINE MRN: [id number] Date of Birth: 07-09-1934  Today's Date: 07/26/2013 Time: 0730-0830 Time Calculation (min): 60 min  Short Term Goals: Week 1:  OT Short Term Goal 1 (Week 1): Patient will complete lower body dressing with mod assist. OT Short Term Goal 2 (Week 1): Patient will complete toileting with min assist for thoroughness with toilet hygiene OT Short Term Goal 3 (Week 1): Patient will perform tranfer to tub bench with superviision OT Short Term Goal 4 (Week 1): Patient will demo improved Rafael Hernandez of bil hands as evidenced by ability open containers independently during self-feeding OT Short Term Goal 5 (Week 1): Patient will demonstrate ability to complete BUE HEP with supervision  Skilled Therapeutic Interventions/Progress Updates: ADL-retraining with focus on bed mobility, transfers, sit <> stand, dynamic standing balance, endurance, improved FMC of bil hands.   Patient received in supine in awaiting therapist for planned ADL.   Patient was challenged to rise and sit and edge of bed unassisted but she was unable to manage bed mobility or motor plan to complete task.   With repeated demonstration, patient completed roll to left using bedrail and rose from side-lying to sitting with mod assist to lift and position right leg over edge of bed.   Patient performed transfer to w/c and from w/c to tub bench with mod assist and verbal cues for technique and safe use of DME (grab bars).    Patient required repeated cues to complete stand-pivot closer to tub bench due to poor awareness of her position prior to sitting.   Patient completed shower seated bathing upper body but was unable to reach lower legs or feet this session.   Patient deferred standing for attempted cleansing of peri-area and required verbal cues to use grab bar while being assisted with washing buttocks.    Patient completed bath, returned to w/c, and completed  grooming at sink.   Patient required 3 sit <> stand to don diaper and pants due to fatigue, requesting rest breaks and steadying assist while standing at RW.   Patient required total assist for donning socks and shoes and elected to recover in recliner, to complete self-feeding of breakfast meal with setup assist to open packets.     Therapy Documentation Precautions:  Precautions Precautions: Fall Restrictions Weight Bearing Restrictions: No  Vital Signs: Therapy Vitals Temp: 98.6 F (37 C) Temp src: Oral Pulse Rate: 82 Resp: 18 BP: 157/80 mmHg Patient Position, if appropriate: Sitting Oxygen Therapy SpO2: 93 % O2 Device: None (Room air)  Pain: Pain Assessment Pain Assessment: 0-10 Pain Score: 5  Pain Type: Acute pain Pain Location: Back Pain Orientation: Upper;Mid Pain Descriptors / Indicators: Aching Pain Frequency: Constant Pain Onset: On-going Patients Stated Pain Goal: 5 Pain Intervention(s): Medication (See eMAR);Repositioned Multiple Pain Sites: No  ADL: ADL ADL Comments: see FIM  See FIM for current functional status  Therapy/Group: Individual Therapy  Second session: Time: 1345-1430 Time Calculation (min):  45 min  Pain Assessment: 3/10 at upper back.  Skilled Therapeutic Interventions: Therapeutic activities with focus on bil hand strengthening using thera-putty.  Patient received supine in bed, sleeping but awakened with minimal stimulation.   Patient required extra time to rise from supine to sitting at edge of bed and to complete transfer to w/c.   OT then assisted patient with donning soft collar and then demonstrated 3 of 11 written exercises to improve hand strength and providing additional revisions to exercise technique  to improve performance with exercises.  Patient demonstrated good attention to exercise with distractions eliminated (TV off, door closed) but requires redirection at time to sustain attention to task.  See FIM for current  functional status  Therapy/Group: Individual Therapy  [place] 07/26/2013, 3:58 PM

## 2013-07-26 NOTE — Progress Notes (Signed)
Subjective/Complaints: Bladder still overactive. Up in shower with OT this morning feeling fairly well. A 12 point review of systems has been performed and if not noted above is otherwise negative.   Objective: Vital Signs: Blood pressure 151/82, pulse 82, temperature 98.6 F (37 C), temperature source Oral, resp. rate 19, height 5\' 3"  (1.6 m), SpO2 93.00%. No results found.  Recent Labs  07/24/13 0500  WBC 16.7*  HGB 10.3*  HCT 32.3*  PLT 294   No results found for this basename: NA, K, CL, CO, GLUCOSE, BUN, CREATININE, CALCIUM,  in the last 72 hours CBG (last 3)  No results found for this basename: GLUCAP,  in the last 72 hours  Wt Readings from Last 3 Encounters:  07/20/13 79.561 kg (175 lb 6.4 oz)  07/20/13 79.561 kg (175 lb 6.4 oz)  01/22/11 115.35 kg (254 lb 4.8 oz)    Physical Exam:  Constitutional:  79 year old obese female  HENT:  Head: Normocephalic.  Eyes: EOM are normal.  Neck: Normal range of motion. Neck supple. No thyromegaly present.  Cardiovascular: Normal rate and regular rhythm.  Respiratory: Effort normal and breath sounds normal. No respiratory distress.  GI: Soft. Bowel sounds are normal. She exhibits no distension.  Neurological:  Patient is alert . She is oriented x3 and followed basic commands. RUE is 2+deltoid, 3 bicep, 3- R tricep, 3/5 wrist and HI.  LUE 3 deltoid, bicep, tricep, 3+/5 L wrist ext ,   3 minus bilateral hip flexors, 3+ right knee extensor, 4/5 left knee extensor, 5/5 bilateral ankle dorsiflexor plantar flexor. Sensation 1-/2 RUE, 1/2 LUE, 3+ bilateral bicep triceps and patellar reflexes, absent tricep reflexes, 1+Achilles reflexes  Skin:  Surgical site clean and dry no swelling or erythema  Psychiatric: She has a normal mood and affect. Her behavior is normal. Judgment and thought content normal. In good spirits  Assessment/Plan: 1. Functional deficits secondary to cervical epidural abscess with subsequent central  cord syndrome (pt s/p evacuation and fusion C4-7) which require 3+ hours per day of interdisciplinary therapy in a comprehensive inpatient rehab setting. Physiatrist is providing close team supervision and 24 hour management of active medical problems listed below. Physiatrist and rehab team continue to assess barriers to discharge/monitor patient progress toward functional and medical goals. FIM: FIM - Bathing Bathing Steps Patient Completed: Chest;Abdomen Bathing: 1: Total-Patient completes 0-2 of 10 parts or less than 25%  FIM - Upper Body Dressing/Undressing Upper body dressing/undressing steps patient completed: Thread/unthread right bra strap;Thread/unthread left bra strap;Thread/unthread right sleeve of pullover shirt/dresss;Thread/unthread left sleeve of pullover shirt/dress Upper body dressing/undressing: 2: Max-Patient completed 25-49% of tasks FIM - Lower Body Dressing/Undressing Lower body dressing/undressing: 1: Total-Patient completed less than 25% of tasks  FIM - Toileting Toileting steps completed by patient: Adjust clothing prior to toileting Toileting Assistive Devices: Grab bar or rail for support Toileting: 1: Total-Patient completed zero steps, helper did all 3  FIM - Radio producer Devices: Nurse, learning disability Transfers: 3-To toilet/BSC: Mod A (lift or lower assist);3-From toilet/BSC: Mod A (lift or lower assist)  FIM - Bed/Chair Transfer Bed/Chair Transfer Assistive Devices: Arm rests;Bed rails Bed/Chair Transfer: 3: Sit > Supine: Mod A (lifting assist/Pt. 50-74%/lift 2 legs);3: Bed > Chair or W/C: Mod A (lift or lower assist)  FIM - Locomotion: Wheelchair Distance: 35' Locomotion: Wheelchair: 2: Travels 50 - 149 ft with supervision, cueing or coaxing FIM - Locomotion: Ambulation Locomotion: Ambulation Assistive Devices: Administrator  Ambulation/Gait Assistance: 3: Mod assist Locomotion: Ambulation: 3: Travels 150 ft or  more with moderate assistance (Pt: 50 - 74%)  Comprehension Comprehension Mode: Auditory Comprehension: 4-Understands basic 75 - 89% of the time/requires cueing 10 - 24% of the time  Expression Expression Mode: Verbal Expression: 5-Expresses basic needs/ideas: With extra time/assistive device  Social Interaction Social Interaction: 6-Interacts appropriately with others with medication or extra time (anti-anxiety, antidepressant).  Problem Solving Problem Solving: 4-Solves basic 75 - 89% of the time/requires cueing 10 - 24% of the time  Memory Memory: 5-Recognizes or recalls 90% of the time/requires cueing < 10% of the time  Medical Problem List and Plan:  1. Cervical epidural abscess with tetraplegia status post decompression and fusion  2. DVT Prophylaxis/Anticoagulation: SCDs. Monitor for any signs of DVT  3. Pain Management/neuropathic pain: Neurontin 300 mg 3 times a day, Hydrocodone and Robaxin as needed.   -added low dose elavil at night as well to assist with neuropathic pain 3. Mood/insomnia: Trazodone 50 mg each bedtime  5. Neuropsych: This patient is capable of making decisions on her own behalf.  6. ID/microaerophilic streptococci. Ceftriaxone x6 weeks through April 16. Followup infectious disease as outpt  -leukocytosis (15k)--afebrile, continue follow clinically.    -need urine collected 7. Hypertension. Lisinopril 5 mg daily, Lasix 20 mg daily. Monitor with increased mobility  8. Asthma. Dulera 2 puffs twice a day, Flonase daily. No complaints of shortness of breath. Continue CPAP  9. Urinary frequency. Ditropan 2.5 mg twice a day.  Follow up urine with yeast  -add diflucan 10. Obesity. History of gastric bypass  LOS (Days) 4 A FACE TO FACE EVALUATION WAS PERFORMED  Mena Simonis T 07/26/2013 8:08 AM

## 2013-07-26 NOTE — Progress Notes (Signed)
Physical Therapy Session Note  Patient Details  Name: [name] MRN: [id number] Date of Birth: Jul 29, 1934  Today's Date: 07/26/2013 Time: 7416-3845 Time Calculation (min): 55 min  Short Term Goals: Week 1:  PT Short Term Goal 1 (Week 1): Pt will transfer bed<>chair with supervision PT Short Term Goal 2 (Week 1): Pt will ambulation x 100' with supervision PT Short Term Goal 3 (Week 1): Pt will propel w/c 46' with modified independence  Skilled Therapeutic Interventions/Progress Updates:    Pain 5/10 with spikes of 9/10 with activity. Pt locates pain in shoulders in distal deltoid region bilaterally Rt>Lt. Pt functionally much more weak today, with only 10' ambulation pt's bil LEs began "sinking" and w/c had to quickly be placed under her to prevent fall. Pt able to tolerate 10' more ambulation however with chair directly to follow and quick sit again. Pt feels pain in UEs is making her LEs weak. Standing tolerance with bil UE supported - only able to tolerate ~15 sec at a time with UEs extended however pain is relieved when propped on elbows. Sit <> stands multple reps while cleaning and changing brief from urinary incontinence.  Stand pivot to bed with mod assist with RW. Heating pad placed for comfort on Rt UE.  RN made aware of functional decline, increased pain, increased numbness in hands  Discussed re-installing rails before d/c home.   Second Session Time:  3646-8032  Time Calculation (min): 33 min Skilled Therapeutic Interventions/Progress Updates:  Pt reports pain (not rated) is better after heating pad however feels numbness increasing and dexterity decreasing in hands. Supine>sit max assist, increased difficulty with supine>sit. Sit <> stands from bed and recliner (x 5 reps) with mod assist. Stand pivot transfer with RW min/mod assist. Mini squats 2 x 5 reps cues for decreasing UE use, standing balance without UE support normal stance and narrow base. Visual challenge for  increased difficulty.   Therapy Documentation Precautions:  Precautions Precautions: Fall Restrictions Weight Bearing Restrictions: No  See FIM for current functional status  Therapy/Group: Individual Therapy both sessions  [name] 07/26/2013, 10:26 AM

## 2013-07-26 NOTE — Progress Notes (Signed)
Patient ID: [name], female   DOB: 1934-11-21, 79 y.o.   MRN: [id number] I was called by Dr. Nevada Crane and radiology for and MRI report as requested by Dr. Vertell Limber. Patient had noted some declining neurologic exam over a week ago mostly manifested as difficulty with transfers getting up out of bed instability or stand on her own and pushing herself up. Patient and family deny any significant change over the last few days. Some of this the patient relates to some changes in her medication was being given that they've made today with regard to increased pain earlier because the medication was not timed with her physical therapy. She does state that she is better tonight she was this morning at the point when her MRI was ordered. Overall she does feel like she has improved since surgery but regressed since immediately after surgery.  Physical exam: Strength appears to be 4 and 5 in her deltoids 4+ out of 5 in her biceps wrist extension is 4/5 wrist flexion is 4 minus out of 5 grip is 2/5 triceps are 2/5. Lower extremity strength appears to be 4-4+ out of 5 in her iliopsoas quads and hamstrings bilaterally she has in dorsiflexion weakness on the right at about 4-5 plantar flexion appears to be 4+ out of 5 bilaterally.  MRI scan does show diffuse meningeal enhancement still some stenosis behind C4-5 and C6-7 the area behind C6-7 still shows area of ring enhancing fluid consistent with possible recurrent abscess this stenosis does appear to be some improved from it was in the initial presenting MRI scan however that MRI with such poor quality is very difficult to make a comparison. There is no signal change within the cord. Dr. Vertell Limber and I have discussed this in great detail and I discussed this with the patient and the family and we've elected to continue to observe the patient. It is possible that she may require a reexploration however should of just removing the interbody spacer at C6-7 were evacuated and that  collection trying to decompress her cord any further would require multilevel vertebrectomy versus a posterior cervical decompression all of the surgical options the patient and family are very reluctant to want to proceed forward in her current deconditioned state. So we will continue to observe her over the next 24-48 hours and see if we can develop trend towards improvement or regression.

## 2013-07-26 NOTE — IPOC Note (Signed)
Overall Plan of Care [facility]) Patient Details Name: [name] MRN: [id number] DOB: 03-Jun-1934  Admitting Diagnosis: EPIDURAL SPINAL ABSCESS ACDF  Hospital Problems: Active Problems:   Epidural abscess     Functional Problem List: Nursing Behavior;Bladder;Bowel;Edema;Endurance;Medication Management;Motor;Pain;Perception;Safety;Sensory;Skin Integrity  PT Balance;Endurance;Pain;Safety  OT Balance;Endurance;Perception;Safety  SLP    TR         Basic ADL's: OT Eating;Grooming;Bathing;Dressing;Toileting     Advanced  ADL's: OT Simple Meal Preparation     Transfers: PT Bed Mobility;Bed to Chair;Car;Furniture  OT Toilet;Tub/Shower     Locomotion: PT Ambulation;Stairs;Wheelchair Mobility     Additional Impairments: OT Fuctional Use of Upper Extremity  SLP        TR      Anticipated Outcomes Item Anticipated Outcome  Self Feeding Mod I  Swallowing      Basic self-care  Min A  Toileting  Min A   Bathroom Transfers Supervision  Bowel/Bladder  Mod A  Transfers  supervision  Locomotion  supervision  Communication     Cognition     Pain  <4 on a 0-10 scale  Safety/Judgment  min A   Therapy Plan: PT Intensity: Minimum of 1-2 x/day ,45 to 90 minutes PT Frequency: 5 out of 7 days PT Duration Estimated Length of Stay: 10-14 days OT Intensity: Minimum of 1-2 x/day, 45 to 90 minutes OT Frequency: 5 out of 7 days OT Duration/Estimated Length of Stay: 10-14 days         Team Interventions: Nursing Interventions Patient/Family Education;Bladder Management;Bowel Management;Disease Management/Prevention;Pain Management;Medication Management;Skin Care/Wound Management;Discharge Planning;Psychosocial Support  PT interventions Ambulation/gait training;Balance/vestibular training;Community reintegration;Discharge planning;Patient/family education;Therapeutic Exercise;Therapeutic Activities;Pain management;Neuromuscular re-education;Stair training;Wheelchair  propulsion/positioning;Splinting/orthotics;Functional mobility training;Functional electrical stimulation;Skin care/wound management;UE/LE Coordination activities;UE/LE Strength taining/ROM;Psychosocial support;DME/adaptive equipment instruction  OT Interventions Therapeutic Exercise;Therapeutic Activities;Self Care/advanced ADL retraining;UE/LE Strength taining/ROM;Discharge planning;Functional mobility training;Patient/family education;DME/adaptive equipment instruction;Neuromuscular re-education;Balance/vestibular training;UE/LE Coordination activities  SLP Interventions    TR Interventions    SW/CM Interventions Discharge Planning;Psychosocial Support;Patient/Family Education    Team Discharge Planning: Destination: PT-Home ,OT-   , SLP-  Projected Follow-up: PT-Home health PT;24 hour supervision/assistance (vs. intermediate supervision), OT-   , SLP-  Projected Equipment Needs: PT-To be determined, OT- To be determined, SLP-  Equipment Details: PT- , OT-Shower chair, tub bench, grab bars Patient/family involved in discharge planning: PT- Patient,  OT-Patient;Family member/caregiver, SLP-   MD ELOS: 10-12d Medical Rehab Prognosis:  Good Assessment:  79 y.o. right-handed female with history of hypertension, COPD. Admitted 07/16/2013 with progressive quadriparesis x3 days. Denied any bowel or bladder disturbances. Patient with low-grade fever 102.2 white blood cell count of 14,000. MRI and imaging revealed cervical epidural abscess. Underwent emergent anterior cervical decompression discectomy with fusion multilevel C4-C7 07/16/2013 per Dr. Vertell Limber. Decadron protocol is advised. Followup infectious disease with spinal abscess fluid growing and microaerophilic streptococci and maintained on ceftriaxone as well as vancomycin  Now requiring 24/7 Rehab RN,MD, as well as CIR level PT, OT and SLP.  Treatment team will focus on ADLs and mobility with goals set at sup/min  See Team Conference Notes for  weekly updates to the plan of care

## 2013-07-27 ENCOUNTER — Inpatient Hospital Stay (HOSPITAL_COMMUNITY): Payer: Medicare Other | Admitting: Physical Therapy

## 2013-07-27 ENCOUNTER — Inpatient Hospital Stay (HOSPITAL_COMMUNITY): Payer: Medicare Other

## 2013-07-27 ENCOUNTER — Inpatient Hospital Stay (HOSPITAL_COMMUNITY): Payer: Medicare Other | Admitting: Occupational Therapy

## 2013-07-27 ENCOUNTER — Inpatient Hospital Stay (HOSPITAL_COMMUNITY): Payer: Medicare Other | Admitting: *Deleted

## 2013-07-27 LAB — CBC
HCT: 31.3 % — ABNORMAL LOW (ref 36.0–46.0)
HEMOGLOBIN: 9.8 g/dL — AB (ref 12.0–15.0)
MCH: 30.5 pg (ref 26.0–34.0)
MCHC: 31.3 g/dL (ref 30.0–36.0)
MCV: 97.5 fL (ref 78.0–100.0)
Platelets: 317 10*3/uL (ref 150–400)
RBC: 3.21 MIL/uL — ABNORMAL LOW (ref 3.87–5.11)
RDW: 14.2 % (ref 11.5–15.5)
WBC: 9.6 10*3/uL (ref 4.0–10.5)

## 2013-07-27 MED ORDER — ALBUTEROL SULFATE (2.5 MG/3ML) 0.083% IN NEBU
2.5000 mg | INHALATION_SOLUTION | Freq: Four times a day (QID) | RESPIRATORY_TRACT | Status: DC | PRN
  Filled 2013-07-27: qty 3

## 2013-07-27 MED ORDER — DEXAMETHASONE SODIUM PHOSPHATE 10 MG/ML IJ SOLN
8.0000 mg | Freq: Four times a day (QID) | INTRAMUSCULAR | Status: DC
  Administered 2013-07-27 – 2013-07-28 (×2): 8 mg via INTRAVENOUS
  Filled 2013-07-27: qty 2
  Filled 2013-07-27 (×4): qty 0.8
  Filled 2013-07-27 (×2): qty 2

## 2013-07-27 NOTE — Progress Notes (Signed)
Physical Therapy Note  Patient Details  Name: [name] MRN: [id number] Date of Birth: [birth year] Today's Date: 07/27/2013  Patient c/o pain in UE's shoulders with spasms in standing 7/10, relieved by seated rest  Participated in supine to sit with assist for rolling, legs off bed and to lift trunk upright (Max A).  Attempted stand pivot transfer to chair with walker, pt unable to maintain standing to step to wheelchair; squat pivot with max assist and RN holding chair.  Attempts at wheelchair mobility with feet limited due to difficulty lifting right leg to propel.  In gym stands to parallel bars mod/max assist pulling on bars.  Stands max 5-6 seconds with assist to block right knee.  Attempt to take steps left with assist for right stability and pt assisted back to chair when attempts to step with right.  Provided encouragement/support when pt tearful due to limited mobility/decline in function.  Individual session 1132-1203   [facility] 07/27/2013, 1:29 PM  Magda Kiel, [place] 07/27/2013

## 2013-07-27 NOTE — Progress Notes (Addendum)
Physical Therapy Session Note  Patient Details  Name: [name] MRN: [id number] Date of Birth: 1935/05/02  Today's Date: 07/27/2013 Time: 0830-0930 Time Calculation (min): 60 min  Short Term Goals: Week 1:  PT Short Term Goal 1 (Week 1): Pt will transfer bed<>chair with supervision PT Short Term Goal 2 (Week 1): Pt will ambulation x 100' with supervision PT Short Term Goal 3 (Week 1): Pt will propel w/c 106' with modified independence  Skilled Therapeutic Interventions/Progress Updates:    Pt appears weaker than yesterday - particularly on Rt side. Unable to sit >stand at table which was not overly difficult yesterday, required heavy mod/max assist to stand w/c > RW. Ambulation only ~2-3 steps then began "sinking" with bil LEs (Rt weaker than Lt) and required w/c directly behind for quick sit. Pt reports pain in UEs is not bad until she stands fully upright then pain "shoots" down her shoulders.   Tolerated seated hand function task with clothes pins. Seated LAQs with 3# weight on Lt LE and AAROM on Rt LE. Rt hip flexors very weak. Pt positioned for comfort at end of session. Pt provided with emotional support as she expressed concerns over current level of function.   Gait demonstrating functional decline from evaluation 4 days ago (eval able to ambulate 30' with RW/mod assist; 2 steps with mod assist) to only a few steps today with LE's quickly "sinking" and w/c needed right behind pt to prevent fall.    Therapy Documentation Precautions:  Precautions Precautions: Fall Restrictions Weight Bearing Restrictions: No Pain: Pain Assessment Pain Score: 7  Pain Type: Acute pain Pain Location: Shoulder Pain Orientation: Right;Left Pain Descriptors / Indicators: Spasm;Sharp Pain Onset: With Activity (full erect standing posture) Pain Intervention(s): Repositioned;RN made aware  See FIM for current functional status  Therapy/Group: Individual Therapy  [name] 07/27/2013, 12:24 PM

## 2013-07-27 NOTE — Progress Notes (Signed)
Subjective: Patient reports "I'm a little worried. I can't do the things I could last week"  Objective: Vital signs in last 24 hours: Temp:  [98.6 F (37 C)-98.7 F (37.1 C)] 98.7 F (37.1 C) (03/17 0551) Pulse Rate:  [77-82] 77 (03/17 0551) Resp:  [18] 18 (03/17 0551) BP: (151-157)/(76-80) 151/76 mmHg (03/17 0551) SpO2:  [93 %-95 %] 95 % (03/17 0708)  Intake/Output from previous day: 03/16 0701 - 03/17 0700 In: 480 [P.O.:480] Out: 3122 [Urine:3122] Intake/Output this shift: Total I/O In: 240 [P.O.:240] Out: -   Alert, sitting in wheelchair. Report called to DrStern re: increased weakness in extremities. MRI obtained and reviewed by DrStern. With visit today, she appears similar in abilities to last day on Unit - which is a decline from last several days' accomplishments (80' ambulation, stairs, etc per pt).  Right hip flexor unable to lift right foot from floor with this exam. Hand intrinsics right weaker than left.   Lab Results:  Recent Labs  07/26/13 1415 07/27/13 0512  WBC 12.2* 9.6  HGB 10.8* 9.8*  HCT 33.7* 31.3*  PLT 342 317   BMET No results found for this basename: NA, K, CL, CO2, GLUCOSE, BUN, CREATININE, CALCIUM,  in the last 72 hours  Studies/Results: Mr Cervical Spine W Wo Contrast  07/26/2013   CLINICAL DATA:  79 year old female status post ACDF for treatment of rapidly developing quadriparesis, found have spinal epidural abscess. Postop day 10. Subsequent encounter.  EXAM: MRI CERVICAL SPINE WITHOUT AND WITH CONTRAST  TECHNIQUE: Multiplanar and multiecho pulse sequences of the cervical spine, to include the craniocervical junction and cervicothoracic junction, were obtained according to standard protocol without and with intravenous contrast.  CONTRAST:  90mL MULTIHANCE GADOBENATE DIMEGLUMINE 529 MG/ML IV SOLN  COMPARISON:  Preoperative MRI 07/15/2013.  FINDINGS: Large body habitus. Abundant susceptibility artifact related to the cervical ACDF hardware and  what may also be some postoperative prevertebral gas. ACDF hardware at C4-C5-C5-C6 and C6-C7.  Abnormal fluid signal in the posterior interbody spaces at each of the operated levels. At these levels there is associated abnormal epidural/ dural thickening and enhancement (series 11, image 7. The fluid centered at the C6-C7 level appears to tracks cephalad in the ventral epidural space (series 11, image 6).  The abnormal dural thickening and enhancement a since cephalad to the C3-C4 level, and caudally into the upper thoracic spine as far as visualized (T3). There is evidence of a developing small posterior epidural collection at C7-T1 (short arrow on series 11, image 7).  Subsequently there is residual spinal stenosis which appears maximal at the C6 vertebral level (estimated AP thecal sac 5- 6 mm).  No definite spinal cord signal abnormality.  Normal thecal sac patency at the T1 level and throughout the visible upper thoracic spine. Above C4 thecal sac patency is also normal. Cervicomedullary junction is within normal limits.  There is stable abnormal increased T2 signal in the pons.  Major vascular flow voids in the neck including the vertebral artery is are preserved.  IMPRESSION: 1. Cervical spine infection. Abnormal dural thickening and enhancement from C3-C4 caudally into the visualized upper thoracic spine. Small ventral and dorsal epidural abscesses at C6-C7. 2. Associated multifactorial spinal stenosis with estimated minimum AP thecal sac of 5 mm at C6-C7. No definite spinal cord edema. No upper thoracic spinal stenosis. 3. Interval postoperative changes of C4-C5 through C6-C7 ACDF. Study discussed by telephone with Dr. Kary Kos (on-call for Dr. Vertell Limber) on 07/26/2013 at 19:53.   Electronically Signed  By: Lars Pinks M.D.   On: 07/26/2013 19:54    Assessment/Plan:   LOS: 5 days  Awaiting plain cervical films. Dr. Vertell Limber will visit when current OR case completed.   Verdis Prime 07/27/2013, 12:46  PM    C-spine radiographs show well positioned hardware and interbody grafts.  Will continue to monitor patient's progress and will go back to surgery with posterior decompression if patient has decline in neurologic function, otherwise manage with antibiotics.

## 2013-07-27 NOTE — Progress Notes (Addendum)
Subjective/Complaints: Slept well. Pain controlled.  A 12 point review of systems has been performed and if not noted above is otherwise negative.   Objective: Vital Signs: Blood pressure 151/76, pulse 77, temperature 98.7 F (37.1 C), temperature source Oral, resp. rate 18, height 5\' 3"  (1.6 m), SpO2 95.00%. Mr Cervical Spine W Wo Contrast  07/26/2013   CLINICAL DATA:  79 year old female status post ACDF for treatment of rapidly developing quadriparesis, found have spinal epidural abscess. Postop day 10. Subsequent encounter.  EXAM: MRI CERVICAL SPINE WITHOUT AND WITH CONTRAST  TECHNIQUE: Multiplanar and multiecho pulse sequences of the cervical spine, to include the craniocervical junction and cervicothoracic junction, were obtained according to standard protocol without and with intravenous contrast.  CONTRAST:  28mL MULTIHANCE GADOBENATE DIMEGLUMINE 529 MG/ML IV SOLN  COMPARISON:  Preoperative MRI 07/15/2013.  FINDINGS: Large body habitus. Abundant susceptibility artifact related to the cervical ACDF hardware and what may also be some postoperative prevertebral gas. ACDF hardware at C4-C5-C5-C6 and C6-C7.  Abnormal fluid signal in the posterior interbody spaces at each of the operated levels. At these levels there is associated abnormal epidural/ dural thickening and enhancement (series 11, image 7. The fluid centered at the C6-C7 level appears to tracks cephalad in the ventral epidural space (series 11, image 6).  The abnormal dural thickening and enhancement a since cephalad to the C3-C4 level, and caudally into the upper thoracic spine as far as visualized (T3). There is evidence of a developing small posterior epidural collection at C7-T1 (short arrow on series 11, image 7).  Subsequently there is residual spinal stenosis which appears maximal at the C6 vertebral level (estimated AP thecal sac 5- 6 mm).  No definite spinal cord signal abnormality.  Normal thecal sac patency at the T1  level and throughout the visible upper thoracic spine. Above C4 thecal sac patency is also normal. Cervicomedullary junction is within normal limits.  There is stable abnormal increased T2 signal in the pons.  Major vascular flow voids in the neck including the vertebral artery is are preserved.  IMPRESSION: 1. Cervical spine infection. Abnormal dural thickening and enhancement from C3-C4 caudally into the visualized upper thoracic spine. Small ventral and dorsal epidural abscesses at C6-C7. 2. Associated multifactorial spinal stenosis with estimated minimum AP thecal sac of 5 mm at C6-C7. No definite spinal cord edema. No upper thoracic spinal stenosis. 3. Interval postoperative changes of C4-C5 through C6-C7 ACDF. Study discussed by telephone with Dr. Kary Kos (on-call for Dr. Vertell Limber) on 07/26/2013 at 19:53.   Electronically Signed   By: Lars Pinks M.D.   On: 07/26/2013 19:54    Recent Labs  07/26/13 1415 07/27/13 0512  WBC 12.2* 9.6  HGB 10.8* 9.8*  HCT 33.7* 31.3*  PLT 342 317   No results found for this basename: NA, K, CL, CO, GLUCOSE, BUN, CREATININE, CALCIUM,  in the last 72 hours CBG (last 3)  No results found for this basename: GLUCAP,  in the last 72 hours  Wt Readings from Last 3 Encounters:  07/20/13 79.561 kg (175 lb 6.4 oz)  07/20/13 79.561 kg (175 lb 6.4 oz)  01/22/11 115.35 kg (254 lb 4.8 oz)    Physical Exam:  Constitutional:  79 year old obese female  HENT:  Head: Normocephalic.  Eyes: EOM are normal.  Neck: Normal range of motion. Neck supple. No thyromegaly present.  Cardiovascular: Normal rate and regular rhythm.  Respiratory: Effort normal and breath sounds normal. No respiratory distress.  GI: Soft. Bowel sounds are normal. She exhibits no distension.  Neurological:  Patient is alert . She is oriented x3 and followed basic commands. RUE is 2+deltoid, 3 bicep, 3- R tricep, 3/5 wrist and HI on left. HI are 2 to 2+ on right.Marland Kitchen  LUE 3 deltoid, bicep, tricep, 3+/5 L  wrist ext ,   3 minus bilateral hip flexors, 3+ right knee extensor, 4/5 left knee extensor, 5/5 bilateral ankle dorsiflexor plantar flexor. Sensation 1-/2 RUE, 1/2 LUE, 3+ bilateral bicep triceps and patellar reflexes, absent tricep reflexes, 1+Achilles reflexes  Skin:  Surgical site clean and dry no swelling or erythema  Psychiatric: She has a normal mood and affect. Her behavior is normal. Judgment and thought content normal. In good spirits  Assessment/Plan: 1. Functional deficits secondary to cervical epidural abscess with subsequent central cord syndrome (pt s/p evacuation and fusion C4-7) which require 3+ hours per day of interdisciplinary therapy in a comprehensive inpatient rehab setting. Physiatrist is providing close team supervision and 24 hour management of active medical problems listed below. Physiatrist and rehab team continue to assess barriers to discharge/monitor patient progress toward functional and medical goals. FIM: FIM - Bathing Bathing Steps Patient Completed: Chest;Abdomen Bathing: 1: Total-Patient completes 0-2 of 10 parts or less than 25%  FIM - Upper Body Dressing/Undressing Upper body dressing/undressing steps patient completed: Thread/unthread right bra strap;Thread/unthread left bra strap;Thread/unthread right sleeve of pullover shirt/dresss;Thread/unthread left sleeve of pullover shirt/dress Upper body dressing/undressing: 2: Max-Patient completed 25-49% of tasks FIM - Lower Body Dressing/Undressing Lower body dressing/undressing: 1: Total-Patient completed less than 25% of tasks  FIM - Toileting Toileting steps completed by patient: Adjust clothing prior to toileting Toileting Assistive Devices: Grab bar or rail for support Toileting: 1: Total-Patient completed zero steps, helper did all 3  FIM - Radio producer Devices: Nurse, learning disability Transfers: 3-To toilet/BSC: Mod A (lift or lower assist);3-From toilet/BSC:  Mod A (lift or lower assist)  FIM - Bed/Chair Transfer Bed/Chair Transfer Assistive Devices: Arm rests;Bed rails Bed/Chair Transfer: 3: Chair or W/C > Bed: Mod A (lift or lower assist);3: Supine > Sit: Mod A (lifting assist/Pt. 50-74%/lift 2 legs  FIM - Locomotion: Wheelchair Distance: 35' Locomotion: Wheelchair: 2: Travels 20 - 149 ft with supervision, cueing or coaxing FIM - Locomotion: Ambulation Locomotion: Ambulation Assistive Devices: Administrator Ambulation/Gait Assistance: 3: Mod assist Locomotion: Ambulation: 1: Two helpers  Comprehension Comprehension Mode: Auditory Comprehension: 4-Understands basic 75 - 89% of the time/requires cueing 10 - 24% of the time  Expression Expression Mode: Verbal Expression: 5-Expresses basic needs/ideas: With extra time/assistive device  Social Interaction Social Interaction: 6-Interacts appropriately with others with medication or extra time (anti-anxiety, antidepressant).  Problem Solving Problem Solving: 4-Solves basic 75 - 89% of the time/requires cueing 10 - 24% of the time  Memory Memory: 5-Recognizes or recalls 90% of the time/requires cueing < 10% of the time  Medical Problem List and Plan:  1. Cervical epidural abscess with tetraplegia status post decompression and fusion  2. DVT Prophylaxis/Anticoagulation: SCDs. Monitor for any signs of DVT  3. Pain Management/neuropathic pain: Neurontin 300 mg 3 times a day, Hydrocodone and Robaxin as needed.   -added low dose elavil at night as well to assist with neuropathic pain  -hydrocodone scheduled at 0600 and 1200 daily to synch with therapy activities 3. Mood/insomnia: Trazodone 50 mg each bedtime  5. Neuropsych: This patient is capable of making decisions on her own behalf.  6. ID/microaerophilic streptococci. Ceftriaxone x6 weeks  through April 16. Followup infectious disease as outpt  -leukocytosis better (9k)--afebrile, continue follow clinically.    -urine fungal  infection--diflucan  -MRI with enhancement but inconclusive as whether it's actually worse  -continue close observation, pain mgt etc to maximize functional output 7. Hypertension. Lisinopril 5 mg daily, Lasix 20 mg daily. Monitor with increased mobility  8. Asthma. Dulera 2 puffs twice a day, Flonase daily. No complaints of shortness of breath. Continue CPAP  9. Urinary frequency. Ditropan 2.5 mg twice a day.  Follow up urine with yeast  -added diflucan 10. Obesity. History of gastric bypass  LOS (Days) 5 A FACE TO FACE EVALUATION WAS PERFORMED  SWARTZ,ZACHARY T 07/27/2013 7:36 AM

## 2013-07-27 NOTE — Progress Notes (Signed)
Occupational Therapy Session Note  Patient Details  Name: [name] MRN: [id number] Date of Birth: 1934/12/23  Today's Date: 07/27/2013 Time: 1330-1405 Time Calculation (min): 35 min  Short Term Goals: Week 1:  OT Short Term Goal 1 (Week 1): Patient will complete lower body dressing with mod assist. OT Short Term Goal 2 (Week 1): Patient will complete toileting with min assist for thoroughness with toilet hygiene OT Short Term Goal 3 (Week 1): Patient will perform tranfer to tub bench with superviision OT Short Term Goal 4 (Week 1): Patient will demo improved [place] of bil hands as evidenced by ability open containers independently during self-feeding OT Short Term Goal 5 (Week 1): Patient will demonstrate ability to complete BUE HEP with supervision  Skilled Therapeutic Interventions/Progress Updates:  Upon entering room, patient found supine in bed with complaints of some shoulder pain; RN aware. Patient stated she has been up most of the day and requested to stay in bed at end of therapy session. Focused skilled intervention on bed mobility (supine>sit), patient required total assist. Patient then worked on scooting > right and maintained static & dynamic sitting balance with occasional steady assist. While seated EOB, patient engaged in fine motor control activity also focusing on AROM > BUE, trunk/core control & strengthening as well as proprioception > BLEs (patient with poor proprioception > RLE). After activity, patient scooted > left close to head of bed with max/total assist. Therapist assisted with sit>supine, patient required max assist for this. Therapist re-positioned patient and left patient in supine with family present in room. Therapist donned Kpad > shoulders to help alleviate pain.   Patient and family are concerned with decline over the past few days. Patient's left UE/hand with decreased strength and her functional mobility has declined from a previous mod assist level >  max/total assist. During fine motor activity, patient unable to effectively and consistently grip with left hand; previously patient was self-feeding without AE (per family report).   Precautions:  Precautions Precautions: Fall Restrictions Weight Bearing Restrictions: No  See FIM for current functional status  Therapy/Group: Individual Therapy  Greenlee Ancheta 07/27/2013, 2:24 PM

## 2013-07-27 NOTE — Progress Notes (Signed)
Occupational Therapy Session Note  Patient Details  Name: [name] MRN: [id number] Date of Birth: 1934-08-30  Today's Date: 07/27/2013 Time: 0623-7628 Time Calculation (min): 63 min  Short Term Goals: Week 1:  OT Short Term Goal 1 (Week 1): Patient will complete lower body dressing with mod assist. OT Short Term Goal 2 (Week 1): Patient will complete toileting with min assist for thoroughness with toilet hygiene OT Short Term Goal 3 (Week 1): Patient will perform tranfer to tub bench with superviision OT Short Term Goal 4 (Week 1): Patient will demo improved [place] of bil hands as evidenced by ability open containers independently during self-feeding OT Short Term Goal 5 (Week 1): Patient will demonstrate ability to complete BUE HEP with supervision  Skilled Therapeutic Interventions/Progress Updates: ADL-retraining with focus on improved endurance, transfers, adapted bathing and dressing skills, [place] of bil hands.   Patient received supine in bed, reporting mild relief of upper back pain with use of heating pad.   Patient attempted bed mobility as instructed but demo'd decline in skills and required max assist to position legs, max assist to lift upper torso from [facility] (elevated) and mod assist to maintain static sitting balance.   Patient completed squat pivot transfer to w/c placed at bedside but required mod assist to initiate stand and advance right leg toward w/c.   Patient was escorted to bathroom in w/c and positioned for transfer to tub bench in walk-in shower but she was unable to complete stand-pivot transfer this session, after 3 attempts, with max assist lifting from w/c (pt = 20%).   Planned shower was substituted with assisted bathing at sink side.   At sink side patient attempted use of both hands but was unable to sustain grasp with right hand.   OT provided wash mit which improved functional use of R-UE to allow cleansing of chest, stomach, and left arm however patient was unable  to lift arms high enough to wash under either axilla.   OT provided total assist with lower body bathing with patient performing partial stand 2 times, for approximately 10 seconds each time before weakening and needing to sit. Patient donned shirt with setup assist but required rest break before assist with donning diaper and pants.   Using bedrail to assist with standing, patient stood from w/c, 4 times to allow assist with pulling up diaper (2 times, to fasten) and pants (2 times to pull at front and back) due to rapid fatigue.   Patient was unable to stand longer than 10 seconds each time while assisted with lower body dressing.   Patient required setup assist for self-feeding at w/c level, call light placed within reach.    Therapy Documentation Precautions:  Precautions Precautions: Fall Restrictions Weight Bearing Restrictions: No   Pain: 4/10, upper back   ADL: ADL ADL Comments: see FIM  See FIM for current functional status  Therapy/Group: Individual Therapy  [place] 07/27/2013, 3:53 PM

## 2013-07-27 NOTE — Progress Notes (Signed)
RN stated pt. Decided not to wear her cpap tonight. RN stated that the pt. Was having some neck pain and that she doesn't want to wear her cpap tonight. RT responded to pt. Room and pt. Stated she was not going to wear it and that she felt like she was breathing ok. RT informed pt. To notify if she changes her mind.

## 2013-07-27 NOTE — Progress Notes (Signed)
Subjective:  Patient reports feeling weaker  Objective:  Vital signs in last 24 hours:  Temp: [98.7 F (37.1 C)] 98.7 F (37.1 C) (03/17 0551)  Pulse Rate: [77] 77 (03/17 0551)  Resp: [18] 18 (03/17 0551)  BP: (151)/(76) 151/76 mmHg (03/17 0551)  SpO2: [94 %-95 %] 95 % (03/17 0708)  Intake/Output from previous day:  03/16 0701 - 03/17 0700  In: 480 [P.O.:480]  Out: 3122 [Urine:3122]  Intake/Output this shift:  Total I/O  In: 480 [P.O.:480]  Out: 600 [Urine:600]  Physical Exam:  Patient is getting weaker with diminished triceps and hand intrinsic strength compared to immediately postop and has decreased dorsiflexion strength in her right leg.  Lab Results:   Recent Labs   07/26/13 1415  07/27/13 0512   WBC  12.2*  9.6   HGB  10.8*  9.8*   HCT  33.7*  31.3*   PLT  342  317    BMET  No results found for this basename: NA, K, CL, CO2, GLUCOSE, BUN, CREATININE, CALCIUM, in the last 72 hours  Studies/Results:  Dg Cervical Spine 2 Or 3 Views  07/27/2013 CLINICAL DATA: Postoperative abscess. EXAM: CERVICAL SPINE - 2-3 VIEW COMPARISON: MRI of the cervical spine 07/26/2013. FINDINGS: Postoperative changes of ACDF from C4-C7 are noted, with interbody grafts at C4-C5, C5-C6 and C6-C7. Evaluation of the cervical spine below the level C6 is limited on today's examination secondary to overlying soft tissues. Straightening of normal cervical lordosis. Prevertebral soft tissue swelling anterior to C3-C4. Multilevel facet arthropathy in degenerative disc disease. IMPRESSION: 1. Postoperative changes related to recent ACDF from C4-C7, as above, with some swelling of the prevertebral soft tissues anterior to C3-C4. Electronically Signed By: Daniel Entrikin M.D. On: 07/27/2013 15:45  Mr Cervical Spine W Wo Contrast  07/26/2013 CLINICAL DATA: 79-year-old female status post ACDF for treatment of rapidly developing quadriparesis, found have spinal epidural abscess. Postop day 10. Subsequent encounter.  EXAM: MRI CERVICAL SPINE WITHOUT AND WITH CONTRAST TECHNIQUE: Multiplanar and multiecho pulse sequences of the cervical spine, to include the craniocervical junction and cervicothoracic junction, were obtained according to standard protocol without and with intravenous contrast. CONTRAST: 15mL MULTIHANCE GADOBENATE DIMEGLUMINE 529 MG/ML IV SOLN COMPARISON: Preoperative MRI 07/15/2013. FINDINGS: Large body habitus. Abundant susceptibility artifact related to the cervical ACDF hardware and what may also be some postoperative prevertebral gas. ACDF hardware at C4-C5-C5-C6 and C6-C7. Abnormal fluid signal in the posterior interbody spaces at each of the operated levels. At these levels there is associated abnormal epidural/ dural thickening and enhancement (series 11, image 7. The fluid centered at the C6-C7 level appears to tracks cephalad in the ventral epidural space (series 11, image 6). The abnormal dural thickening and enhancement a since cephalad to the C3-C4 level, and caudally into the upper thoracic spine as far as visualized (T3). There is evidence of a developing small posterior epidural collection at C7-T1 (short arrow on series 11, image 7). Subsequently there is residual spinal stenosis which appears maximal at the C6 vertebral level (estimated AP thecal sac 5- 6 mm). No definite spinal cord signal abnormality. Normal thecal sac patency at the T1 level and throughout the visible upper thoracic spine. Above C4 thecal sac patency is also normal. Cervicomedullary junction is within normal limits. There is stable abnormal increased T2 signal in the pons. Major vascular flow voids in the neck including the vertebral artery is are preserved. IMPRESSION: 1. Cervical spine infection. Abnormal dural thickening and enhancement from C3-C4 caudally into the   visualized upper thoracic spine. Small ventral and dorsal epidural abscesses at C6-C7. 2. Associated multifactorial spinal stenosis with estimated minimum AP  thecal sac of 5 mm at C6-C7. No definite spinal cord edema. No upper thoracic spinal stenosis. 3. Interval postoperative changes of C4-C5 through C6-C7 ACDF. Study discussed by telephone with Dr. Gary Cram (on-call for Dr. Kadey Mihalic) on 07/26/2013 at 19:53. Electronically Signed By: Lee Hall M.D. On: 07/26/2013 19:54   Assessment/Plan:  Recurrent spinal cord compression with epidural abscess despite IV ABX. Plan is to take patient to surgery in am and decompress her spinal cord with laminectomy from C 4 - C 7 levels with posterior fixation. Risks and benefits discussed with patient, who wishes to proceed.  LOS: 5 days  Brenn Gatton D, MD  07/27/2013, 5:32 PM  

## 2013-07-27 NOTE — Progress Notes (Signed)
Pt. Has home cpap unit. RN states she will place pt. On when pt. Is ready.

## 2013-07-28 ENCOUNTER — Encounter (HOSPITAL_COMMUNITY): Payer: Medicare Other | Admitting: Anesthesiology

## 2013-07-28 ENCOUNTER — Inpatient Hospital Stay (HOSPITAL_COMMUNITY): Payer: Medicare Other

## 2013-07-28 ENCOUNTER — Encounter (HOSPITAL_COMMUNITY)
Admission: RE | Disposition: A | Discharge status: Other Acute Inpatient Hospital | Payer: Self-pay | Source: Intra-hospital | Attending: Physical Medicine & Rehabilitation

## 2013-07-28 ENCOUNTER — Inpatient Hospital Stay (HOSPITAL_COMMUNITY): Payer: Medicare Other | Admitting: Anesthesiology

## 2013-07-28 ENCOUNTER — Encounter (HOSPITAL_COMMUNITY): Payer: Self-pay | Admitting: Anesthesiology

## 2013-07-28 ENCOUNTER — Inpatient Hospital Stay (HOSPITAL_COMMUNITY)
Admission: AD | Admit: 2013-07-28 | Discharge: 2013-07-31 | DRG: 028 | Disposition: A | Discharge status: Rehabilitation Facility | Payer: Medicare Other | Source: Ambulatory Visit | Attending: Neurosurgery | Admitting: Neurosurgery

## 2013-07-28 ENCOUNTER — Encounter (HOSPITAL_COMMUNITY)
Admission: AD | Disposition: A | Discharge status: Rehabilitation Facility | Payer: Self-pay | Source: Ambulatory Visit | Attending: Neurosurgery

## 2013-07-28 DIAGNOSIS — G959 Disease of spinal cord, unspecified: Secondary | ICD-10-CM | POA: Diagnosis present

## 2013-07-28 DIAGNOSIS — Z6841 Body Mass Index (BMI) 40.0 and over, adult: Secondary | ICD-10-CM

## 2013-07-28 DIAGNOSIS — K5900 Constipation, unspecified: Secondary | ICD-10-CM | POA: Diagnosis present

## 2013-07-28 DIAGNOSIS — G8250 Quadriplegia, unspecified: Secondary | ICD-10-CM

## 2013-07-28 DIAGNOSIS — Z7982 Long term (current) use of aspirin: Secondary | ICD-10-CM

## 2013-07-28 DIAGNOSIS — G4700 Insomnia, unspecified: Secondary | ICD-10-CM | POA: Diagnosis present

## 2013-07-28 DIAGNOSIS — Z85828 Personal history of other malignant neoplasm of skin: Secondary | ICD-10-CM

## 2013-07-28 DIAGNOSIS — I10 Essential (primary) hypertension: Secondary | ICD-10-CM | POA: Diagnosis present

## 2013-07-28 DIAGNOSIS — Z9884 Bariatric surgery status: Secondary | ICD-10-CM

## 2013-07-28 DIAGNOSIS — M47812 Spondylosis without myelopathy or radiculopathy, cervical region: Secondary | ICD-10-CM | POA: Diagnosis present

## 2013-07-28 DIAGNOSIS — G4730 Sleep apnea, unspecified: Secondary | ICD-10-CM

## 2013-07-28 DIAGNOSIS — R350 Frequency of micturition: Secondary | ICD-10-CM | POA: Diagnosis present

## 2013-07-28 DIAGNOSIS — J4489 Other specified chronic obstructive pulmonary disease: Secondary | ICD-10-CM | POA: Diagnosis present

## 2013-07-28 DIAGNOSIS — G061 Intraspinal abscess and granuloma: Secondary | ICD-10-CM

## 2013-07-28 DIAGNOSIS — E669 Obesity, unspecified: Secondary | ICD-10-CM | POA: Diagnosis present

## 2013-07-28 DIAGNOSIS — Z79899 Other long term (current) drug therapy: Secondary | ICD-10-CM

## 2013-07-28 DIAGNOSIS — J449 Chronic obstructive pulmonary disease, unspecified: Secondary | ICD-10-CM | POA: Diagnosis present

## 2013-07-28 HISTORY — PX: POSTERIOR CERVICAL FUSION/FORAMINOTOMY: SHX5038

## 2013-07-28 LAB — SURGICAL PCR SCREEN
MRSA, PCR: NEGATIVE
Staphylococcus aureus: NEGATIVE

## 2013-07-28 SURGERY — POSTERIOR CERVICAL FUSION/FORAMINOTOMY LEVEL 3
Anesthesia: General

## 2013-07-28 SURGERY — POSTERIOR CERVICAL FUSION/FORAMINOTOMY LEVEL 1
Anesthesia: General

## 2013-07-28 MED ORDER — ACETAMINOPHEN 650 MG RE SUPP: 650.0000 mg | RECTAL | Status: DC | PRN

## 2013-07-28 MED ORDER — ONDANSETRON HCL 4 MG/2ML IJ SOLN: 4.0000 mg | INTRAMUSCULAR | Status: DC | PRN

## 2013-07-28 MED ORDER — ROCURONIUM BROMIDE 100 MG/10ML IV SOLN
INTRAVENOUS | Status: DC | PRN
  Administered 2013-07-28: 10 mg via INTRAVENOUS
  Administered 2013-07-28: 20 mg via INTRAVENOUS
  Administered 2013-07-28: 30 mg via INTRAVENOUS

## 2013-07-28 MED ORDER — DEXAMETHASONE SODIUM PHOSPHATE 4 MG/ML IJ SOLN
4.0000 mg | Freq: Four times a day (QID) | INTRAMUSCULAR | Status: AC
  Administered 2013-07-29 (×3): 4 mg via INTRAVENOUS
  Filled 2013-07-28 (×3): qty 1

## 2013-07-28 MED ORDER — LIDOCAINE HCL (CARDIAC) 20 MG/ML IV SOLN
INTRAVENOUS | Status: AC
  Filled 2013-07-28: qty 10

## 2013-07-28 MED ORDER — OXYBUTYNIN CHLORIDE 5 MG PO TABS
2.5000 mg | ORAL_TABLET | Freq: Two times a day (BID) | ORAL | Status: DC
  Administered 2013-07-28: 2.5 mg via ORAL
  Administered 2013-07-29: 22:00:00 via ORAL
  Administered 2013-07-29 – 2013-07-30 (×2): 2.5 mg via ORAL
  Filled 2013-07-28 (×6): qty 0.5

## 2013-07-28 MED ORDER — BISACODYL 10 MG RE SUPP
10.0000 mg | Freq: Every day | RECTAL | Status: DC | PRN
  Administered 2013-07-30: 10 mg via RECTAL
  Filled 2013-07-28: qty 1

## 2013-07-28 MED ORDER — VITAMIN D 1000 UNITS PO TABS
2000.0000 [IU] | ORAL_TABLET | Freq: Every day | ORAL | Status: DC
  Administered 2013-07-29 – 2013-07-30 (×2): 2000 [IU] via ORAL
  Filled 2013-07-28 (×3): qty 2

## 2013-07-28 MED ORDER — SODIUM CHLORIDE 0.9 % IJ SOLN
3.0000 mL | Freq: Two times a day (BID) | INTRAMUSCULAR | Status: DC
  Administered 2013-07-28 – 2013-07-30 (×5): 3 mL via INTRAVENOUS

## 2013-07-28 MED ORDER — DEXAMETHASONE SODIUM PHOSPHATE 10 MG/ML IJ SOLN
INTRAMUSCULAR | Status: DC | PRN
  Administered 2013-07-28: 10 mg via INTRAVENOUS

## 2013-07-28 MED ORDER — NEOSTIGMINE METHYLSULFATE 1 MG/ML IJ SOLN
INTRAMUSCULAR | Status: AC
  Filled 2013-07-28: qty 10

## 2013-07-28 MED ORDER — ONDANSETRON HCL 4 MG/2ML IJ SOLN
INTRAMUSCULAR | Status: AC
  Filled 2013-07-28: qty 2

## 2013-07-28 MED ORDER — LORATADINE 10 MG PO TABS
10.0000 mg | ORAL_TABLET | Freq: Every day | ORAL | Status: DC
  Administered 2013-07-29 – 2013-07-30 (×2): 10 mg via ORAL
  Filled 2013-07-28 (×3): qty 1

## 2013-07-28 MED ORDER — MENTHOL 3 MG MT LOZG: 1.0000 | LOZENGE | OROMUCOSAL | Status: DC | PRN

## 2013-07-28 MED ORDER — HYDROMORPHONE HCL PF 1 MG/ML IJ SOLN
0.2500 mg | INTRAMUSCULAR | Status: DC | PRN
  Administered 2013-07-28 (×4): 0.5 mg via INTRAVENOUS

## 2013-07-28 MED ORDER — SODIUM CHLORIDE 0.9 % IV SOLN: 0.5000 mg/h | Freq: Once | INTRAVENOUS | Status: DC

## 2013-07-28 MED ORDER — BACITRACIN ZINC 500 UNIT/GM EX OINT
TOPICAL_OINTMENT | CUTANEOUS | Status: DC | PRN
  Administered 2013-07-28: 1 via TOPICAL

## 2013-07-28 MED ORDER — ADULT MULTIVITAMIN W/MINERALS CH
1.0000 | ORAL_TABLET | Freq: Every day | ORAL | Status: DC
  Administered 2013-07-29 – 2013-07-30 (×2): 1 via ORAL
  Filled 2013-07-28 (×3): qty 1

## 2013-07-28 MED ORDER — LIDOCAINE-EPINEPHRINE 1 %-1:100000 IJ SOLN
INTRAMUSCULAR | Status: DC | PRN
  Administered 2013-07-28: 10 mL

## 2013-07-28 MED ORDER — THROMBIN 5000 UNITS EX SOLR
CUTANEOUS | Status: DC | PRN
  Administered 2013-07-28 (×2): 5000 [IU] via TOPICAL

## 2013-07-28 MED ORDER — PANTOPRAZOLE SODIUM 40 MG IV SOLR
40.0000 mg | Freq: Every day | INTRAVENOUS | Status: DC
  Administered 2013-07-28 – 2013-07-29 (×2): 40 mg via INTRAVENOUS
  Filled 2013-07-28 (×4): qty 40

## 2013-07-28 MED ORDER — SUCCINYLCHOLINE CHLORIDE 20 MG/ML IJ SOLN
INTRAMUSCULAR | Status: DC | PRN
  Administered 2013-07-28: 40 mg via INTRAVENOUS
  Administered 2013-07-28: 120 mg via INTRAVENOUS
  Administered 2013-07-28: 40 mg via INTRAVENOUS

## 2013-07-28 MED ORDER — LIDOCAINE HCL (CARDIAC) 20 MG/ML IV SOLN
INTRAVENOUS | Status: DC | PRN
  Administered 2013-07-28: 100 mg via INTRAVENOUS

## 2013-07-28 MED ORDER — ACETAMINOPHEN 325 MG PO TABS: 650.0000 mg | ORAL_TABLET | ORAL | Status: DC | PRN

## 2013-07-28 MED ORDER — HYDROMORPHONE HCL PF 1 MG/ML IJ SOLN: 0.5000 mg | Freq: Once | INTRAMUSCULAR | Status: DC

## 2013-07-28 MED ORDER — HYDROCODONE-ACETAMINOPHEN 5-325 MG PO TABS
1.0000 | ORAL_TABLET | ORAL | Status: DC | PRN
Start: 2013-07-28 — End: 2013-07-28

## 2013-07-28 MED ORDER — PHENOL 1.4 % MT LIQD: 1.0000 | OROMUCOSAL | Status: DC | PRN

## 2013-07-28 MED ORDER — HYDROCODONE-ACETAMINOPHEN 5-325 MG PO TABS
1.0000 | ORAL_TABLET | ORAL | Status: DC | PRN
  Administered 2013-07-28 – 2013-07-29 (×3): 2 via ORAL
  Filled 2013-07-28 (×3): qty 2

## 2013-07-28 MED ORDER — SODIUM CHLORIDE 0.9 % IJ SOLN: 10.0000 mL | Freq: Two times a day (BID) | INTRAMUSCULAR | Status: DC

## 2013-07-28 MED ORDER — LISINOPRIL 5 MG PO TABS
5.0000 mg | ORAL_TABLET | Freq: Every day | ORAL | Status: DC
  Administered 2013-07-29 – 2013-07-30 (×2): 5 mg via ORAL
  Filled 2013-07-28 (×3): qty 1

## 2013-07-28 MED ORDER — CLOTRIMAZOLE 1 % EX CREA
TOPICAL_CREAM | Freq: Two times a day (BID) | CUTANEOUS | Status: DC
  Administered 2013-07-28 – 2013-07-31 (×4): via TOPICAL
  Filled 2013-07-28: qty 15

## 2013-07-28 MED ORDER — LACTATED RINGERS IV SOLN
INTRAVENOUS | Status: DC | PRN
  Administered 2013-07-28: 13:00:00 via INTRAVENOUS

## 2013-07-28 MED ORDER — ARTIFICIAL TEARS OP OINT
TOPICAL_OINTMENT | OPHTHALMIC | Status: AC
  Filled 2013-07-28: qty 3.5

## 2013-07-28 MED ORDER — SODIUM CHLORIDE 0.9 % IV SOLN
10.0000 mg | INTRAVENOUS | Status: DC | PRN
  Administered 2013-07-28: 10 ug/min via INTRAVENOUS

## 2013-07-28 MED ORDER — ONDANSETRON HCL 4 MG/2ML IJ SOLN
INTRAMUSCULAR | Status: DC | PRN
  Administered 2013-07-28: 4 mg via INTRAVENOUS

## 2013-07-28 MED ORDER — SUCCINYLCHOLINE CHLORIDE 20 MG/ML IJ SOLN
INTRAMUSCULAR | Status: AC
  Filled 2013-07-28: qty 1

## 2013-07-28 MED ORDER — SODIUM CHLORIDE 0.9 % IR SOLN
Status: DC | PRN
  Administered 2013-07-28: 12:00:00

## 2013-07-28 MED ORDER — OXYCODONE-ACETAMINOPHEN 5-325 MG PO TABS
1.0000 | ORAL_TABLET | ORAL | Status: DC | PRN
  Administered 2013-07-29 – 2013-07-30 (×5): 2 via ORAL
  Administered 2013-07-30: 1 via ORAL
  Administered 2013-07-30 – 2013-07-31 (×4): 2 via ORAL
  Filled 2013-07-28 (×10): qty 2

## 2013-07-28 MED ORDER — PROPOFOL 10 MG/ML IV BOLUS
INTRAVENOUS | Status: DC | PRN
  Administered 2013-07-28: 50 mg via INTRAVENOUS
  Administered 2013-07-28: 200 mg via INTRAVENOUS

## 2013-07-28 MED ORDER — CALCIUM CARBONATE 1250 (500 CA) MG PO TABS
2.0000 | ORAL_TABLET | Freq: Every day | ORAL | Status: DC
  Administered 2013-07-29 – 2013-07-30 (×2): 1000 mg via ORAL
  Filled 2013-07-28 (×3): qty 2

## 2013-07-28 MED ORDER — DOCUSATE SODIUM 100 MG PO CAPS
100.0000 mg | ORAL_CAPSULE | Freq: Two times a day (BID) | ORAL | Status: DC
  Administered 2013-07-28 – 2013-07-31 (×6): 100 mg via ORAL
  Filled 2013-07-28 (×7): qty 1

## 2013-07-28 MED ORDER — WHITE PETROLATUM GEL
Status: AC
  Administered 2013-07-28: 0.2
  Filled 2013-07-28: qty 5

## 2013-07-28 MED ORDER — LACTATED RINGERS IV SOLN
INTRAVENOUS | Status: DC | PRN
  Administered 2013-07-28: 11:00:00 via INTRAVENOUS

## 2013-07-28 MED ORDER — FENTANYL CITRATE 0.05 MG/ML IJ SOLN
INTRAMUSCULAR | Status: AC
  Filled 2013-07-28: qty 5

## 2013-07-28 MED ORDER — KCL IN DEXTROSE-NACL 20-5-0.45 MEQ/L-%-% IV SOLN
INTRAVENOUS | Status: DC
  Administered 2013-07-28: 23:00:00 via INTRAVENOUS
  Filled 2013-07-28 (×8): qty 1000

## 2013-07-28 MED ORDER — SODIUM CHLORIDE 0.9 % IR SOLN
Status: DC | PRN
  Administered 2013-07-28: 15:00:00

## 2013-07-28 MED ORDER — ALUM & MAG HYDROXIDE-SIMETH 200-200-20 MG/5ML PO SUSP: 30.0000 mL | Freq: Four times a day (QID) | ORAL | Status: DC | PRN

## 2013-07-28 MED ORDER — ONDANSETRON HCL 4 MG/2ML IJ SOLN: 4.0000 mg | Freq: Once | INTRAMUSCULAR | Status: DC | PRN

## 2013-07-28 MED ORDER — SODIUM CHLORIDE 0.9 % IV SOLN: 250.0000 mL | INTRAVENOUS | Status: DC

## 2013-07-28 MED ORDER — HYDROMORPHONE HCL PF 1 MG/ML IJ SOLN: 0.2500 mg | INTRAMUSCULAR | Status: DC | PRN

## 2013-07-28 MED ORDER — PROPOFOL 10 MG/ML IV BOLUS
INTRAVENOUS | Status: AC
  Filled 2013-07-28: qty 20

## 2013-07-28 MED ORDER — FENTANYL CITRATE 0.05 MG/ML IJ SOLN
INTRAMUSCULAR | Status: DC | PRN
  Administered 2013-07-28: 250 ug via INTRAVENOUS

## 2013-07-28 MED ORDER — FLUTICASONE PROPIONATE 50 MCG/ACT NA SUSP
2.0000 | Freq: Every day | NASAL | Status: DC
  Administered 2013-07-30: 2 via NASAL
  Filled 2013-07-28: qty 16

## 2013-07-28 MED ORDER — SODIUM CHLORIDE 0.9 % IJ SOLN: 10.0000 mL | INTRAMUSCULAR | Status: DC | PRN

## 2013-07-28 MED ORDER — PNEUMOCOCCAL 13-VAL CONJ VACC IM SUSP
0.5000 mL | Freq: Once | INTRAMUSCULAR | Status: DC
  Filled 2013-07-28: qty 0.5

## 2013-07-28 MED ORDER — DEXTROSE 5 % IV SOLN
2.0000 g | INTRAVENOUS | Status: AC
  Administered 2013-07-28: 2 g via INTRAVENOUS
  Filled 2013-07-28: qty 2

## 2013-07-28 MED ORDER — TRAZODONE HCL 50 MG PO TABS
50.0000 mg | ORAL_TABLET | Freq: Every day | ORAL | Status: DC
  Administered 2013-07-28 – 2013-07-29 (×2): 50 mg via ORAL
  Filled 2013-07-28 (×3): qty 1

## 2013-07-28 MED ORDER — ASPIRIN 81 MG PO CHEW
81.0000 mg | CHEWABLE_TABLET | Freq: Every day | ORAL | Status: DC
  Administered 2013-07-29 – 2013-07-30 (×2): 81 mg via ORAL
  Filled 2013-07-28 (×2): qty 1

## 2013-07-28 MED ORDER — HYDROMORPHONE HCL PF 1 MG/ML IJ SOLN
INTRAMUSCULAR | Status: AC
  Filled 2013-07-28: qty 1

## 2013-07-28 MED ORDER — MORPHINE SULFATE 2 MG/ML IJ SOLN
1.0000 mg | INTRAMUSCULAR | Status: DC | PRN
  Administered 2013-07-28 – 2013-07-29 (×4): 2 mg via INTRAVENOUS
  Administered 2013-07-29: 4 mg via INTRAVENOUS
  Administered 2013-07-29 – 2013-07-31 (×4): 2 mg via INTRAVENOUS
  Filled 2013-07-28 (×8): qty 1
  Filled 2013-07-28: qty 2

## 2013-07-28 MED ORDER — MOMETASONE FURO-FORMOTEROL FUM 100-5 MCG/ACT IN AERO
2.0000 | INHALATION_SPRAY | Freq: Two times a day (BID) | RESPIRATORY_TRACT | Status: DC
  Administered 2013-07-28 – 2013-07-30 (×4): 2 via RESPIRATORY_TRACT
  Filled 2013-07-28: qty 8.8

## 2013-07-28 MED ORDER — PREDNISONE 20 MG PO TABS: 40.0000 mg | ORAL_TABLET | Freq: Every day | ORAL | Status: DC

## 2013-07-28 MED ORDER — BUPIVACAINE HCL (PF) 0.5 % IJ SOLN
INTRAMUSCULAR | Status: DC | PRN
  Administered 2013-07-28: 10 mL

## 2013-07-28 MED ORDER — FLEET ENEMA 7-19 GM/118ML RE ENEM
1.0000 | ENEMA | Freq: Once | RECTAL | Status: AC | PRN
  Filled 2013-07-28: qty 1

## 2013-07-28 MED ORDER — DIAZEPAM 5 MG PO TABS
5.0000 mg | ORAL_TABLET | Freq: Four times a day (QID) | ORAL | Status: DC | PRN
  Administered 2013-07-28 – 2013-07-31 (×6): 5 mg via ORAL
  Filled 2013-07-28 (×6): qty 1

## 2013-07-28 MED ORDER — ACETAMINOPHEN ER 650 MG PO TBCR: 1300.0000 mg | EXTENDED_RELEASE_TABLET | Freq: Three times a day (TID) | ORAL | Status: DC | PRN

## 2013-07-28 MED ORDER — GLYCOPYRROLATE 0.2 MG/ML IJ SOLN
INTRAMUSCULAR | Status: AC
  Filled 2013-07-28: qty 3

## 2013-07-28 MED ORDER — CEFAZOLIN SODIUM 1-5 GM-% IV SOLN
1.0000 g | Freq: Three times a day (TID) | INTRAVENOUS | Status: AC
  Administered 2013-07-28 – 2013-07-29 (×2): 1 g via INTRAVENOUS
  Filled 2013-07-28 (×2): qty 50

## 2013-07-28 MED ORDER — SODIUM CHLORIDE 0.9 % IJ SOLN
3.0000 mL | INTRAMUSCULAR | Status: DC | PRN
  Administered 2013-07-29: 22:00:00 via INTRAVENOUS

## 2013-07-28 MED ORDER — 0.9 % SODIUM CHLORIDE (POUR BTL) OPTIME
TOPICAL | Status: DC | PRN
  Administered 2013-07-28: 1000 mL

## 2013-07-28 MED ORDER — HEMOSTATIC AGENTS (NO CHARGE) OPTIME
TOPICAL | Status: DC | PRN
  Administered 2013-07-28: 1 via TOPICAL

## 2013-07-28 MED ORDER — PHENYLEPHRINE HCL 10 MG/ML IJ SOLN
INTRAMUSCULAR | Status: DC | PRN
  Administered 2013-07-28: 80 ug via INTRAVENOUS
  Administered 2013-07-28: 40 ug via INTRAVENOUS
  Administered 2013-07-28 (×2): 80 ug via INTRAVENOUS
  Administered 2013-07-28 (×3): 40 ug via INTRAVENOUS

## 2013-07-28 MED ORDER — SENNA 8.6 MG PO TABS
1.0000 | ORAL_TABLET | Freq: Two times a day (BID) | ORAL | Status: DC
  Administered 2013-07-29 – 2013-07-30 (×3): 8.6 mg via ORAL
  Filled 2013-07-28 (×5): qty 1

## 2013-07-28 MED ORDER — SENNOSIDES-DOCUSATE SODIUM 8.6-50 MG PO TABS
1.0000 | ORAL_TABLET | Freq: Every evening | ORAL | Status: DC | PRN
  Filled 2013-07-28: qty 1

## 2013-07-28 MED ORDER — CYANOCOBALAMIN 500 MCG PO TABS
500.0000 ug | ORAL_TABLET | Freq: Every day | ORAL | Status: DC
  Administered 2013-07-29 – 2013-07-30 (×2): 500 ug via ORAL
  Filled 2013-07-28 (×3): qty 1

## 2013-07-28 MED ORDER — NEOSTIGMINE METHYLSULFATE 1 MG/ML IJ SOLN
INTRAMUSCULAR | Status: DC | PRN
  Administered 2013-07-28: 4 mg via INTRAVENOUS

## 2013-07-28 MED ORDER — PHENYLEPHRINE HCL 10 MG/ML IJ SOLN
INTRAMUSCULAR | Status: AC
  Filled 2013-07-28: qty 1

## 2013-07-28 MED ORDER — FERROUS FUMARATE 325 (106 FE) MG PO TABS
1.0000 | ORAL_TABLET | ORAL | Status: DC
  Filled 2013-07-28 (×2): qty 1

## 2013-07-28 MED ORDER — FUROSEMIDE 20 MG PO TABS
20.0000 mg | ORAL_TABLET | Freq: Every day | ORAL | Status: DC
  Administered 2013-07-29 – 2013-07-30 (×2): 20 mg via ORAL
  Filled 2013-07-28 (×3): qty 1

## 2013-07-28 MED ORDER — PHENYLEPHRINE 40 MCG/ML (10ML) SYRINGE FOR IV PUSH (FOR BLOOD PRESSURE SUPPORT)
PREFILLED_SYRINGE | INTRAVENOUS | Status: AC
  Filled 2013-07-28: qty 10

## 2013-07-28 MED ORDER — GLYCOPYRROLATE 0.2 MG/ML IJ SOLN
INTRAMUSCULAR | Status: DC | PRN
  Administered 2013-07-28: 0.6 mg via INTRAVENOUS

## 2013-07-28 MED ORDER — ROCURONIUM BROMIDE 50 MG/5ML IV SOLN
INTRAVENOUS | Status: AC
  Filled 2013-07-28: qty 2

## 2013-07-28 SURGICAL SUPPLY — 87 items
APL SKNCLS STERI-STRIP NONHPOA (GAUZE/BANDAGES/DRESSINGS) ×1
BAG DECANTER FOR FLEXI CONT (MISCELLANEOUS) ×3 IMPLANT
BENZOIN TINCTURE PRP APPL 2/3 (GAUZE/BANDAGES/DRESSINGS) ×3 IMPLANT
BIT DRILL NEURO 2X3.1 SFT TUCH (MISCELLANEOUS) ×1 IMPLANT
BIT DRILL VUEPOINT II (BIT) ×1 IMPLANT
BLADE SURG 11 STRL SS (BLADE) ×3 IMPLANT
BLADE SURG ROTATE 9660 (MISCELLANEOUS) IMPLANT
BLADE ULTRA TIP 2M (BLADE) IMPLANT
BUR PRECISION FLUTE 5.0 (BURR) IMPLANT
CANISTER SUCT 3000ML (MISCELLANEOUS) ×3 IMPLANT
CLOSURE WOUND 1/2 X4 (GAUZE/BANDAGES/DRESSINGS)
CONT SPEC 4OZ CLIKSEAL STRL BL (MISCELLANEOUS) ×6 IMPLANT
DERMABOND ADVANCED (GAUZE/BANDAGES/DRESSINGS) ×4
DERMABOND ADVANCED .7 DNX12 (GAUZE/BANDAGES/DRESSINGS) ×2 IMPLANT
DRAPE C-ARM 42X72 X-RAY (DRAPES) ×6 IMPLANT
DRAPE LAPAROTOMY 100X72 PEDS (DRAPES) ×3 IMPLANT
DRAPE MICROSCOPE LEICA (MISCELLANEOUS) ×3 IMPLANT
DRAPE POUCH INSTRU U-SHP 10X18 (DRAPES) ×3 IMPLANT
DRESSING TELFA 8X3 (GAUZE/BANDAGES/DRESSINGS) ×3 IMPLANT
DRILL BIT VUEPOINT II (BIT) ×2
DRILL NEURO 2X3.1 SOFT TOUCH (MISCELLANEOUS) ×3
DRSG OPSITE POSTOP 4X8 (GAUZE/BANDAGES/DRESSINGS) ×3 IMPLANT
DURAPREP 6ML APPLICATOR 50/CS (WOUND CARE) ×6 IMPLANT
ELECT BLADE 4.0 EZ CLEAN MEGAD (MISCELLANEOUS) ×3
ELECT REM PT RETURN 9FT ADLT (ELECTROSURGICAL) ×3
ELECTRODE BLDE 4.0 EZ CLN MEGD (MISCELLANEOUS) ×1 IMPLANT
ELECTRODE REM PT RTRN 9FT ADLT (ELECTROSURGICAL) ×1 IMPLANT
EVACUATOR 1/8 PVC DRAIN (DRAIN) ×3 IMPLANT
GAUZE SPONGE 4X4 16PLY XRAY LF (GAUZE/BANDAGES/DRESSINGS) IMPLANT
GLOVE BIO SURGEON STRL SZ8 (GLOVE) ×3 IMPLANT
GLOVE BIOGEL PI IND STRL 8 (GLOVE) ×1 IMPLANT
GLOVE BIOGEL PI IND STRL 8.5 (GLOVE) ×1 IMPLANT
GLOVE BIOGEL PI INDICATOR 8 (GLOVE) ×2
GLOVE BIOGEL PI INDICATOR 8.5 (GLOVE) ×2
GLOVE ECLIPSE 6.5 STRL STRAW (GLOVE) ×3 IMPLANT
GLOVE ECLIPSE 8.0 STRL XLNG CF (GLOVE) ×3 IMPLANT
GLOVE EXAM NITRILE LRG STRL (GLOVE) IMPLANT
GLOVE EXAM NITRILE MD LF STRL (GLOVE) IMPLANT
GLOVE EXAM NITRILE XL STR (GLOVE) IMPLANT
GLOVE EXAM NITRILE XS STR PU (GLOVE) IMPLANT
GLOVE INDICATOR 7.5 STRL GRN (GLOVE) ×6 IMPLANT
GLOVE SURG SS PI 7.0 STRL IVOR (GLOVE) ×12 IMPLANT
GOWN BRE IMP SLV AUR LG STRL (GOWN DISPOSABLE) IMPLANT
GOWN BRE IMP SLV AUR XL STRL (GOWN DISPOSABLE) IMPLANT
GOWN SPEC L3 XXLG W/TWL (GOWN DISPOSABLE) ×3 IMPLANT
GOWN STRL REIN 2XL LVL4 (GOWN DISPOSABLE) IMPLANT
GOWN STRL REUS W/ TWL LRG LVL3 (GOWN DISPOSABLE) ×1 IMPLANT
GOWN STRL REUS W/ TWL XL LVL3 (GOWN DISPOSABLE) ×2 IMPLANT
GOWN STRL REUS W/TWL LRG LVL3 (GOWN DISPOSABLE) ×2
GOWN STRL REUS W/TWL MED LVL3 (GOWN DISPOSABLE) ×6 IMPLANT
GOWN STRL REUS W/TWL XL LVL3 (GOWN DISPOSABLE) ×4
HEMOSTAT SURGICEL 2X14 (HEMOSTASIS) IMPLANT
KIT BASIN OR (CUSTOM PROCEDURE TRAY) ×3 IMPLANT
KIT ROOM TURNOVER OR (KITS) ×3 IMPLANT
MARKER SKIN DUAL TIP RULER LAB (MISCELLANEOUS) ×3 IMPLANT
MILL MEDIUM DISP (BLADE) ×3 IMPLANT
NEEDLE HYPO 18GX1.5 BLUNT FILL (NEEDLE) IMPLANT
NEEDLE HYPO 25X1 1.5 SAFETY (NEEDLE) ×3 IMPLANT
NEEDLE SPNL 22GX3.5 QUINCKE BK (NEEDLE) ×3 IMPLANT
NS IRRIG 1000ML POUR BTL (IV SOLUTION) ×3 IMPLANT
PACK LAMINECTOMY NEURO (CUSTOM PROCEDURE TRAY) ×3 IMPLANT
PAD ABD 8X10 STRL (GAUZE/BANDAGES/DRESSINGS) IMPLANT
PIN MAYFIELD SKULL DISP (PIN) ×3 IMPLANT
ROD 120MM (Rod) ×2 IMPLANT
ROD SPNL 120X3.5XNS LF TI (Rod) ×1 IMPLANT
RUBBERBAND STERILE (MISCELLANEOUS) IMPLANT
SCREW MA MM 3.5X12 (Screw) ×24 IMPLANT
SCREW SET THREADED (Screw) ×24 IMPLANT
SPONGE GAUZE 4X4 12PLY (GAUZE/BANDAGES/DRESSINGS) ×3 IMPLANT
SPONGE INTESTINAL PEANUT (DISPOSABLE) ×3 IMPLANT
SPONGE SURGIFOAM ABS GEL SZ50 (HEMOSTASIS) ×3 IMPLANT
STAPLER SKIN PROX WIDE 3.9 (STAPLE) ×3 IMPLANT
STRIP CLOSURE SKIN 1/2X4 (GAUZE/BANDAGES/DRESSINGS) IMPLANT
SUT ETHILON 3 0 FSL (SUTURE) ×3 IMPLANT
SUT VIC AB 0 CT1 18XCR BRD8 (SUTURE) ×1 IMPLANT
SUT VIC AB 0 CT1 8-18 (SUTURE) ×2
SUT VIC AB 2-0 CP2 18 (SUTURE) ×3 IMPLANT
SUT VIC AB 2-0 CT1 18 (SUTURE) ×3 IMPLANT
SUT VIC AB 3-0 SH 8-18 (SUTURE) IMPLANT
SYR 20ML ECCENTRIC (SYRINGE) ×3 IMPLANT
SYR 3ML LL SCALE MARK (SYRINGE) IMPLANT
TOWEL OR 17X24 6PK STRL BLUE (TOWEL DISPOSABLE) ×3 IMPLANT
TOWEL OR 17X26 10 PK STRL BLUE (TOWEL DISPOSABLE) ×3 IMPLANT
TRAP SPECIMEN MUCOUS 40CC (MISCELLANEOUS) ×3 IMPLANT
TRAY FOLEY CATH 14FRSI W/METER (CATHETERS) IMPLANT
UNDERPAD 30X30 INCONTINENT (UNDERPADS AND DIAPERS) ×3 IMPLANT
WATER STERILE IRR 1000ML POUR (IV SOLUTION) ×3 IMPLANT

## 2013-07-28 SURGICAL SUPPLY — 74 items
BAG DECANTER FOR FLEXI CONT (MISCELLANEOUS) ×3 IMPLANT
BENZOIN TINCTURE PRP APPL 2/3 (GAUZE/BANDAGES/DRESSINGS) ×3 IMPLANT
BIT DRILL NEURO 2X3.1 SFT TUCH (MISCELLANEOUS) IMPLANT
BLADE SURG 11 STRL SS (BLADE) ×3 IMPLANT
BLADE SURG ROTATE 9660 (MISCELLANEOUS) IMPLANT
BLADE ULTRA TIP 2M (BLADE) IMPLANT
BUR PRECISION FLUTE 5.0 (BURR) ×3 IMPLANT
CANISTER SUCT 3000ML (MISCELLANEOUS) ×3 IMPLANT
CLOSURE WOUND 1/2 X4 (GAUZE/BANDAGES/DRESSINGS)
CONT SPEC 4OZ CLIKSEAL STRL BL (MISCELLANEOUS) ×6 IMPLANT
DRAPE C-ARM 42X72 X-RAY (DRAPES) ×6 IMPLANT
DRAPE LAPAROTOMY 100X72 PEDS (DRAPES) ×3 IMPLANT
DRAPE MICROSCOPE LEICA (MISCELLANEOUS) ×3 IMPLANT
DRAPE POUCH INSTRU U-SHP 10X18 (DRAPES) ×3 IMPLANT
DRESSING TELFA 8X3 (GAUZE/BANDAGES/DRESSINGS) ×3 IMPLANT
DRILL NEURO 2X3.1 SOFT TOUCH (MISCELLANEOUS)
DURAPREP 6ML APPLICATOR 50/CS (WOUND CARE) ×6 IMPLANT
ELECT BLADE 4.0 EZ CLEAN MEGAD (MISCELLANEOUS) ×3
ELECT REM PT RETURN 9FT ADLT (ELECTROSURGICAL) ×3
ELECTRODE BLDE 4.0 EZ CLN MEGD (MISCELLANEOUS) ×1 IMPLANT
ELECTRODE REM PT RTRN 9FT ADLT (ELECTROSURGICAL) ×1 IMPLANT
GAUZE SPONGE 4X4 16PLY XRAY LF (GAUZE/BANDAGES/DRESSINGS) IMPLANT
GLOVE BIO SURGEON STRL SZ8 (GLOVE) ×3 IMPLANT
GLOVE BIOGEL PI IND STRL 8 (GLOVE) ×1 IMPLANT
GLOVE BIOGEL PI IND STRL 8.5 (GLOVE) ×1 IMPLANT
GLOVE BIOGEL PI INDICATOR 8 (GLOVE) ×2
GLOVE BIOGEL PI INDICATOR 8.5 (GLOVE) ×2
GLOVE ECLIPSE 6.5 STRL STRAW (GLOVE) ×3 IMPLANT
GLOVE ECLIPSE 8.0 STRL XLNG CF (GLOVE) ×3 IMPLANT
GLOVE EXAM NITRILE LRG STRL (GLOVE) ×3 IMPLANT
GLOVE EXAM NITRILE MD LF STRL (GLOVE) IMPLANT
GLOVE EXAM NITRILE XL STR (GLOVE) IMPLANT
GLOVE EXAM NITRILE XS STR PU (GLOVE) IMPLANT
GLOVE INDICATOR 7.5 STRL GRN (GLOVE) ×6 IMPLANT
GLOVE SURG SS PI 7.0 STRL IVOR (GLOVE) ×15 IMPLANT
GOWN BRE IMP SLV AUR LG STRL (GOWN DISPOSABLE) IMPLANT
GOWN BRE IMP SLV AUR XL STRL (GOWN DISPOSABLE) IMPLANT
GOWN SPEC L3 XXLG W/TWL (GOWN DISPOSABLE) ×3 IMPLANT
GOWN STRL REIN 2XL LVL4 (GOWN DISPOSABLE) IMPLANT
GOWN STRL REUS W/ TWL LRG LVL3 (GOWN DISPOSABLE) ×1 IMPLANT
GOWN STRL REUS W/ TWL XL LVL3 (GOWN DISPOSABLE) ×1 IMPLANT
GOWN STRL REUS W/TWL LRG LVL3 (GOWN DISPOSABLE) ×2
GOWN STRL REUS W/TWL MED LVL3 (GOWN DISPOSABLE) ×3 IMPLANT
GOWN STRL REUS W/TWL XL LVL3 (GOWN DISPOSABLE) ×2
HEMOSTAT SURGICEL 2X14 (HEMOSTASIS) IMPLANT
KIT BASIN OR (CUSTOM PROCEDURE TRAY) ×3 IMPLANT
KIT ROOM TURNOVER OR (KITS) ×3 IMPLANT
MARKER SKIN DUAL TIP RULER LAB (MISCELLANEOUS) ×3 IMPLANT
MILL MEDIUM DISP (BLADE) ×3 IMPLANT
NEEDLE HYPO 18GX1.5 BLUNT FILL (NEEDLE) IMPLANT
NEEDLE HYPO 25X1 1.5 SAFETY (NEEDLE) ×3 IMPLANT
NEEDLE SPNL 22GX3.5 QUINCKE BK (NEEDLE) ×3 IMPLANT
NS IRRIG 1000ML POUR BTL (IV SOLUTION) ×3 IMPLANT
PACK LAMINECTOMY NEURO (CUSTOM PROCEDURE TRAY) ×3 IMPLANT
PAD ABD 8X10 STRL (GAUZE/BANDAGES/DRESSINGS) ×6 IMPLANT
PIN MAYFIELD SKULL DISP (PIN) ×3 IMPLANT
RUBBERBAND STERILE (MISCELLANEOUS) IMPLANT
SPONGE GAUZE 4X4 12PLY (GAUZE/BANDAGES/DRESSINGS) ×3 IMPLANT
SPONGE INTESTINAL PEANUT (DISPOSABLE) ×3 IMPLANT
SPONGE SURGIFOAM ABS GEL SZ50 (HEMOSTASIS) ×3 IMPLANT
STAPLER SKIN PROX WIDE 3.9 (STAPLE) ×3 IMPLANT
STRIP CLOSURE SKIN 1/2X4 (GAUZE/BANDAGES/DRESSINGS) IMPLANT
SUT ETHILON 3 0 FSL (SUTURE) ×3 IMPLANT
SUT VIC AB 0 CT1 18XCR BRD8 (SUTURE) ×1 IMPLANT
SUT VIC AB 0 CT1 8-18 (SUTURE) ×2
SUT VIC AB 2-0 CP2 18 (SUTURE) ×3 IMPLANT
SUT VIC AB 3-0 SH 8-18 (SUTURE) IMPLANT
SYR 20ML ECCENTRIC (SYRINGE) ×3 IMPLANT
SYR 3ML LL SCALE MARK (SYRINGE) IMPLANT
TOWEL OR 17X24 6PK STRL BLUE (TOWEL DISPOSABLE) ×3 IMPLANT
TOWEL OR 17X26 10 PK STRL BLUE (TOWEL DISPOSABLE) ×3 IMPLANT
TRAY FOLEY CATH 14FRSI W/METER (CATHETERS) ×3 IMPLANT
UNDERPAD 30X30 INCONTINENT (UNDERPADS AND DIAPERS) ×3 IMPLANT
WATER STERILE IRR 1000ML POUR (IV SOLUTION) ×3 IMPLANT

## 2013-07-28 NOTE — H&P (Signed)
Subjective:  Patient reports feeling weaker  Objective:  Vital signs in last 24 hours:  Temp: [98.7 F (37.1 C)] 98.7 F (37.1 C) (03/17 0551)  Pulse Rate: [77] 77 (03/17 0551)  Resp: [18] 18 (03/17 0551)  BP: (151)/(76) 151/76 mmHg (03/17 0551)  SpO2: [94 %-95 %] 95 % (03/17 0708)  Intake/Output from previous day:  03/16 0701 - 03/17 0700  In: 480 [P.O.:480]  Out: 3122 [Urine:3122]  Intake/Output this shift:  Total I/O  In: 480 [P.O.:480]  Out: 600 [Urine:600]  Physical Exam:  Patient is getting weaker with diminished triceps and hand intrinsic strength compared to immediately postop and has decreased dorsiflexion strength in her right leg.  Lab Results:   Recent Labs   07/26/13 1415  07/27/13 0512   WBC  12.2*  9.6   HGB  10.8*  9.8*   HCT  33.7*  31.3*   PLT  342  317    BMET  No results found for this basename: NA, K, CL, CO2, GLUCOSE, BUN, CREATININE, CALCIUM, in the last 72 hours  Studies/Results:  Dg Cervical Spine 2 Or 3 Views  07/27/2013 CLINICAL DATA: Postoperative abscess. EXAM: CERVICAL SPINE - 2-3 VIEW COMPARISON: MRI of the cervical spine 07/26/2013. FINDINGS: Postoperative changes of ACDF from C4-C7 are noted, with interbody grafts at C4-C5, C5-C6 and C6-C7. Evaluation of the cervical spine below the level C6 is limited on today's examination secondary to overlying soft tissues. Straightening of normal cervical lordosis. Prevertebral soft tissue swelling anterior to C3-C4. Multilevel facet arthropathy in degenerative disc disease. IMPRESSION: 1. Postoperative changes related to recent ACDF from C4-C7, as above, with some swelling of the prevertebral soft tissues anterior to C3-C4. Electronically Signed By: Vinnie Langton M.D. On: 07/27/2013 15:45  Mr Cervical Spine W Wo Contrast  07/26/2013 CLINICAL DATA: 79 year old female status post ACDF for treatment of rapidly developing quadriparesis, found have spinal epidural abscess. Postop day 10. Subsequent encounter.  EXAM: MRI CERVICAL SPINE WITHOUT AND WITH CONTRAST TECHNIQUE: Multiplanar and multiecho pulse sequences of the cervical spine, to include the craniocervical junction and cervicothoracic junction, were obtained according to standard protocol without and with intravenous contrast. CONTRAST: 79mL MULTIHANCE GADOBENATE DIMEGLUMINE 529 MG/ML IV SOLN COMPARISON: Preoperative MRI 07/15/2013. FINDINGS: Large body habitus. Abundant susceptibility artifact related to the cervical ACDF hardware and what may also be some postoperative prevertebral gas. ACDF hardware at C4-C5-C5-C6 and C6-C7. Abnormal fluid signal in the posterior interbody spaces at each of the operated levels. At these levels there is associated abnormal epidural/ dural thickening and enhancement (series 11, image 7. The fluid centered at the C6-C7 level appears to tracks cephalad in the ventral epidural space (series 11, image 6). The abnormal dural thickening and enhancement a since cephalad to the C3-C4 level, and caudally into the upper thoracic spine as far as visualized (T3). There is evidence of a developing small posterior epidural collection at C7-T1 (short arrow on series 11, image 7). Subsequently there is residual spinal stenosis which appears maximal at the C6 vertebral level (estimated AP thecal sac 5- 6 mm). No definite spinal cord signal abnormality. Normal thecal sac patency at the T1 level and throughout the visible upper thoracic spine. Above C4 thecal sac patency is also normal. Cervicomedullary junction is within normal limits. There is stable abnormal increased T2 signal in the pons. Major vascular flow voids in the neck including the vertebral artery is are preserved. IMPRESSION: 1. Cervical spine infection. Abnormal dural thickening and enhancement from C3-C4 caudally into the  visualized upper thoracic spine. Small ventral and dorsal epidural abscesses at C6-C7. 2. Associated multifactorial spinal stenosis with estimated minimum AP  thecal sac of 5 mm at C6-C7. No definite spinal cord edema. No upper thoracic spinal stenosis. 3. Interval postoperative changes of C4-C5 through C6-C7 ACDF. Study discussed by telephone with Dr. Kary Kos (on-call for Dr. Vertell Limber) on 07/26/2013 at 19:53. Electronically Signed By: Lars Pinks M.D. On: 07/26/2013 19:54   Assessment/Plan:  Recurrent spinal cord compression with epidural abscess despite IV ABX. Plan is to take patient to surgery in am and decompress her spinal cord with laminectomy from C 4 - C 7 levels with posterior fixation. Risks and benefits discussed with patient, who wishes to proceed.  LOS: 5 days  Peggyann Shoals, MD  07/27/2013, 5:32 PM

## 2013-07-28 NOTE — Progress Notes (Signed)
Patient discharged to OR at 10:55 with no c/o of pain. CHG bath given this am.

## 2013-07-28 NOTE — Brief Op Note (Addendum)
07/28/2013  4:24 PM  PATIENT:  [name]  79 y.o. female  PRE-OPERATIVE DIAGNOSIS:  epidural spinal abscess with spinal cord compression and quadriparesis  POST-OPERATIVE DIAGNOSIS: epidural spinal abscess with spinal cord compression and quadriparesis  PROCEDURE:  Procedure(s): Cervical four-seven  posterior cervical fusion (N/A) with lateral mass screws, posterolateral arthrodesis C 4 - C 7 levels with cervical laminectomy C 3 - T 1 levels  SURGEON:  Surgeon(s) and Role:    * Erline Levine, MD - Primary    * Winfield Cunas, MD - Assisting  PHYSICIAN ASSISTANT:   ASSISTANTS: Poteat, RN   ANESTHESIA:   general  EBL:  Total I/O In: 1100 [I.V.:1100] Out: 1150 [Urine:950; Blood:200]  BLOOD ADMINISTERED:none  DRAINS: (Medium) Hemovact drain(s) in the epidural space with  Suction Open   LOCAL MEDICATIONS USED:  LIDOCAINE   SPECIMEN:  No Specimen  DISPOSITION OF SPECIMEN:  N/A  COUNTS:  YES  TOURNIQUET:  * No tourniquets in log *  DICTATION: Patient is 79 year old woman with epidural spinal abscess and quadriparesis, cervical stenosis.  She had significant improvement after emergent ACDF C 45, C 56, C 67 levels, but then had gradual deterioration of neurological function and repeat MRI showed cord compression. It was elected to take the patient to surgery for posterior cervical decompression and fusion. She is morbidly obese.  PROCEDURE: Patient was brought to the OR and following smooth and uncomplicated induction of GETA , patient was placed in 3 pin fixation and rolled into a prone position on the OR table in the cervical collar.  Shoulders were taped to facilitate Xray visualization. Posterior neck was shaved with clippers, prepped and draped in the usual fashion with betadine scrub followed by Duraprep.  Area of planned incision was infiltrated with local lidocaine.  Incision was made from C3-T1 and carried through the avascular midline plane to expose these lavels and  their lateral masses.  There was significant arthritis at each of these levels with facet arthropathy and bony excrescences.  Lateral mass screws were placed from C4 through C7 bilaterally according to standard landmarks and their positioning was confirmed with fluoroscopy.  The facet joints and laminae were decorticated.  Screws and rods were locked down in situ.  The spinous processes of C4-C7 were then removed and the laminae were thinned with a high speed drill. A total laminectomy was performed with undercutting of the C3 lamina all the way to the top of T1 and the foraminae were also decompressed at each level. The spinal cord dura appeared to be pulsatile and well decompressed. There was significant dural thickening and granulation tissue without frank pus at the C 6 - T 1 levels.  Hemostasis was assured and a medium Hemovac drain was placed in the epidural space. Wound was closed with 0, 2-0, and 3-0 vicryl sutures were used to re approximate the skin edges. Sterile occlusive dressing was placed.  Patient was taken out of pins and turned back onto the OR table.  Patient was extubated in the operating room and taken to recovery in stable and satisfactory condition.  Counts were correct at the end of the case.  PLAN OF CARE: Admit to inpatient   PATIENT DISPOSITION:  PACU - hemodynamically stable.   Delay start of Pharmacological VTE agent (>24hrs) due to surgical blood loss or risk of bleeding: yes

## 2013-07-28 NOTE — Progress Notes (Signed)
Awake, alert, complaining of neck pain.  Strength improved from preop with better triceps, grips and hand intrinsics.  Also, right foot dorsiflexion appears improved.  Doing well.

## 2013-07-28 NOTE — Preoperative (Signed)
Beta Blockers   Reason not to administer Beta Blockers:Not Applicable 

## 2013-07-28 NOTE — Progress Notes (Signed)
Subjective: Patient reports feeling stronger  Objective: Vital signs in last 24 hours: Temp:  [97.5 F (36.4 C)-98.3 F (36.8 C)] 97.5 F (36.4 C) (03/18 0545) Pulse Rate:  [79-87] 87 (03/18 0545) Resp:  [18-19] 18 (03/18 0545) BP: (148-174)/(76-88) 148/76 mmHg (03/18 0545) SpO2:  [91 %-95 %] 91 % (03/18 0545)  Intake/Output from previous day: 03/17 0701 - 03/18 0700 In: 480 [P.O.:480] Out: 1600 [Urine:1600] Intake/Output this shift:    Physical Exam: Exam improved this am since initiation of steroids.  Better triceps and grips bilaterally.  Able to dorsiflex right foot.  Lab Results:  Recent Labs  07/26/13 1415 07/27/13 0512  WBC 12.2* 9.6  HGB 10.8* 9.8*  HCT 33.7* 31.3*  PLT 342 317   BMET No results found for this basename: NA, K, CL, CO2, GLUCOSE, BUN, CREATININE, CALCIUM,  in the last 72 hours  Studies/Results: Dg Cervical Spine 2 Or 3 Views  07/27/2013   CLINICAL DATA:  Postoperative abscess.  EXAM: CERVICAL SPINE - 2-3 VIEW  COMPARISON:  MRI of the cervical spine 07/26/2013.  FINDINGS: Postoperative changes of ACDF from C4-C7 are noted, with interbody grafts at C4-C5, C5-C6 and C6-C7. Evaluation of the cervical spine below the level C6 is limited on today's examination secondary to overlying soft tissues. Straightening of normal cervical lordosis. Prevertebral soft tissue swelling anterior to C3-C4. Multilevel facet arthropathy in degenerative disc disease.  IMPRESSION: 1. Postoperative changes related to recent ACDF from C4-C7, as above, with some swelling of the prevertebral soft tissues anterior to C3-C4.   Electronically Signed   By: Vinnie Langton M.D.   On: 07/27/2013 15:45   Mr Cervical Spine W Wo Contrast  07/26/2013   CLINICAL DATA:  79 year old female status post ACDF for treatment of rapidly developing quadriparesis, found have spinal epidural abscess. Postop day 10. Subsequent encounter.  EXAM: MRI CERVICAL SPINE WITHOUT AND WITH CONTRAST  TECHNIQUE:  Multiplanar and multiecho pulse sequences of the cervical spine, to include the craniocervical junction and cervicothoracic junction, were obtained according to standard protocol without and with intravenous contrast.  CONTRAST:  71mL MULTIHANCE GADOBENATE DIMEGLUMINE 529 MG/ML IV SOLN  COMPARISON:  Preoperative MRI 07/15/2013.  FINDINGS: Large body habitus. Abundant susceptibility artifact related to the cervical ACDF hardware and what may also be some postoperative prevertebral gas. ACDF hardware at C4-C5-C5-C6 and C6-C7.  Abnormal fluid signal in the posterior interbody spaces at each of the operated levels. At these levels there is associated abnormal epidural/ dural thickening and enhancement (series 11, image 7. The fluid centered at the C6-C7 level appears to tracks cephalad in the ventral epidural space (series 11, image 6).  The abnormal dural thickening and enhancement a since cephalad to the C3-C4 level, and caudally into the upper thoracic spine as far as visualized (T3). There is evidence of a developing small posterior epidural collection at C7-T1 (short arrow on series 11, image 7).  Subsequently there is residual spinal stenosis which appears maximal at the C6 vertebral level (estimated AP thecal sac 5- 6 mm).  No definite spinal cord signal abnormality.  Normal thecal sac patency at the T1 level and throughout the visible upper thoracic spine. Above C4 thecal sac patency is also normal. Cervicomedullary junction is within normal limits.  There is stable abnormal increased T2 signal in the pons.  Major vascular flow voids in the neck including the vertebral artery is are preserved.  IMPRESSION: 1. Cervical spine infection. Abnormal dural thickening and enhancement from C3-C4 caudally into the visualized  upper thoracic spine. Small ventral and dorsal epidural abscesses at C6-C7. 2. Associated multifactorial spinal stenosis with estimated minimum AP thecal sac of 5 mm at C6-C7. No definite spinal cord  edema. No upper thoracic spinal stenosis. 3. Interval postoperative changes of C4-C5 through C6-C7 ACDF. Study discussed by telephone with Dr. Kary Kos (on-call for Dr. Vertell Limber) on 07/26/2013 at 19:53.   Electronically Signed   By: Lars Pinks M.D.   On: 07/26/2013 19:54    Assessment/Plan: Improved exam from yesterday evening, but overall still worse over last few days.  We will proceed with surgery this morning.    LOS: 6 days    Peggyann Shoals, MD 07/28/2013, 7:52 AM

## 2013-07-28 NOTE — Progress Notes (Signed)
Subjective/Complaints: Awake. A little nervous but hopeful that surgery will improve her neurological status  A 12 point review of systems has been performed and if not noted above is otherwise negative.   Objective: Vital Signs: Blood pressure 148/76, pulse 87, temperature 97.5 F (36.4 C), temperature source Oral, resp. rate 18, height 5\' 3"  (1.6 m), SpO2 91.00%. Dg Cervical Spine 2 Or 3 Views  07/27/2013   CLINICAL DATA:  Postoperative abscess.  EXAM: CERVICAL SPINE - 2-3 VIEW  COMPARISON:  MRI of the cervical spine 07/26/2013.  FINDINGS: Postoperative changes of ACDF from C4-C7 are noted, with interbody grafts at C4-C5, C5-C6 and C6-C7. Evaluation of the cervical spine below the level C6 is limited on today's examination secondary to overlying soft tissues. Straightening of normal cervical lordosis. Prevertebral soft tissue swelling anterior to C3-C4. Multilevel facet arthropathy in degenerative disc disease.  IMPRESSION: 1. Postoperative changes related to recent ACDF from C4-C7, as above, with some swelling of the prevertebral soft tissues anterior to C3-C4.   Electronically Signed   By: Vinnie Langton M.D.   On: 07/27/2013 15:45   Mr Cervical Spine W Wo Contrast  07/26/2013   CLINICAL DATA:  79 year old female status post ACDF for treatment of rapidly developing quadriparesis, found have spinal epidural abscess. Postop day 10. Subsequent encounter.  EXAM: MRI CERVICAL SPINE WITHOUT AND WITH CONTRAST  TECHNIQUE: Multiplanar and multiecho pulse sequences of the cervical spine, to include the craniocervical junction and cervicothoracic junction, were obtained according to standard protocol without and with intravenous contrast.  CONTRAST:  2mL MULTIHANCE GADOBENATE DIMEGLUMINE 529 MG/ML IV SOLN  COMPARISON:  Preoperative MRI 07/15/2013.  FINDINGS: Large body habitus. Abundant susceptibility artifact related to the cervical ACDF hardware and what may also be some postoperative  prevertebral gas. ACDF hardware at C4-C5-C5-C6 and C6-C7.  Abnormal fluid signal in the posterior interbody spaces at each of the operated levels. At these levels there is associated abnormal epidural/ dural thickening and enhancement (series 11, image 7. The fluid centered at the C6-C7 level appears to tracks cephalad in the ventral epidural space (series 11, image 6).  The abnormal dural thickening and enhancement a since cephalad to the C3-C4 level, and caudally into the upper thoracic spine as far as visualized (T3). There is evidence of a developing small posterior epidural collection at C7-T1 (short arrow on series 11, image 7).  Subsequently there is residual spinal stenosis which appears maximal at the C6 vertebral level (estimated AP thecal sac 5- 6 mm).  No definite spinal cord signal abnormality.  Normal thecal sac patency at the T1 level and throughout the visible upper thoracic spine. Above C4 thecal sac patency is also normal. Cervicomedullary junction is within normal limits.  There is stable abnormal increased T2 signal in the pons.  Major vascular flow voids in the neck including the vertebral artery is are preserved.  IMPRESSION: 1. Cervical spine infection. Abnormal dural thickening and enhancement from C3-C4 caudally into the visualized upper thoracic spine. Small ventral and dorsal epidural abscesses at C6-C7. 2. Associated multifactorial spinal stenosis with estimated minimum AP thecal sac of 5 mm at C6-C7. No definite spinal cord edema. No upper thoracic spinal stenosis. 3. Interval postoperative changes of C4-C5 through C6-C7 ACDF. Study discussed by telephone with Dr. Kary Kos (on-call for Dr. Vertell Limber) on 07/26/2013 at 19:53.   Electronically Signed   By: Lars Pinks M.D.   On: 07/26/2013 19:54    Recent Labs  07/26/13  1415 07/27/13 0512  WBC 12.2* 9.6  HGB 10.8* 9.8*  HCT 33.7* 31.3*  PLT 342 317   No results found for this basename: NA, K, CL, CO, GLUCOSE, BUN, CREATININE, CALCIUM,   in the last 72 hours CBG (last 3)  No results found for this basename: GLUCAP,  in the last 72 hours  Wt Readings from Last 3 Encounters:  07/20/13 79.561 kg (175 lb 6.4 oz)  07/20/13 79.561 kg (175 lb 6.4 oz)  01/22/11 115.35 kg (254 lb 4.8 oz)    Physical Exam:  Constitutional:  79 year old obese female  HENT:  Head: Normocephalic.  Eyes: EOM are normal.  Neck: Normal range of motion. Neck supple. No thyromegaly present.  Cardiovascular: Normal rate and regular rhythm.  Respiratory: Effort normal and breath sounds normal. No respiratory distress.  GI: Soft. Bowel sounds are normal. She exhibits no distension.  Neurological:  Patient is alert . She is oriented x3 and followed basic commands. RUE is 2+deltoid, 3 bicep, 3- R tricep, 3-/5 wrist and HI on left. HI are 2  on right.Marland Kitchen  LUE 3 deltoid, bicep, tricep, 3+/5 L wrist ext ,   3 minus bilateral hip flexors, 3+ right knee extensor, 4/5 left knee extensor, 5/5 bilateral ankle dorsiflexor plantar flexor. Sensation 1-/2 RUE, 1/2 LUE, 3+ bilateral bicep triceps and patellar reflexes, absent tricep reflexes, 1+Achilles reflexes  Skin:  Surgical site clean and dry no swelling or erythema  Psychiatric: She has a normal mood and affect. Her behavior is normal. Judgment and thought content normal. In good spirits  Assessment/Plan: 1. Functional deficits secondary to cervical epidural abscess with subsequent central cord syndrome (pt s/p evacuation and fusion C4-7) which require 3+ hours per day of interdisciplinary therapy in a comprehensive inpatient rehab setting. Physiatrist is providing close team supervision and 24 hour management of active medical problems listed below. Physiatrist and rehab team continue to assess barriers to discharge/monitor patient progress toward functional and medical goals.  Pt to OR today. Will follow for progress while on acute.   FIM: FIM - Bathing Bathing Steps Patient Completed: Chest;Right Arm;Left  Arm;Abdomen Bathing: 2: Max-Patient completes 3-4 25f 10 parts or 25-49%  FIM - Upper Body Dressing/Undressing Upper body dressing/undressing steps patient completed: Thread/unthread right sleeve of pullover shirt/dresss;Thread/unthread left sleeve of pullover shirt/dress;Put head through opening of pull over shirt/dress;Pull shirt over trunk Upper body dressing/undressing: 4: Min-Patient completed 75 plus % of tasks FIM - Lower Body Dressing/Undressing Lower body dressing/undressing: 1: Total-Patient completed less than 25% of tasks  FIM - Toileting Toileting steps completed by patient: Adjust clothing prior to toileting Toileting Assistive Devices: Grab bar or rail for support Toileting: 1: Total-Patient completed zero steps, helper did all 3  FIM - Radio producer Devices: Nurse, learning disability Transfers: 3-To toilet/BSC: Mod A (lift or lower assist);3-From toilet/BSC: Mod A (lift or lower assist)  FIM - Control and instrumentation engineer Devices: Walker;Arm rests Bed/Chair Transfer: 2: Supine > Sit: Max A (lifting assist/Pt. 25-49%);2: Bed > Chair or W/C: Max A (lift and lower assist)  FIM - Locomotion: Wheelchair Distance: 35' Locomotion: Wheelchair: 1: Travels less than 50 ft with moderate assistance (Pt: 50 - 74%) FIM - Locomotion: Ambulation Locomotion: Ambulation Assistive Devices: Parallel bars Ambulation/Gait Assistance: 2: Max assist Locomotion: Ambulation: 1: Two helpers  Comprehension Comprehension Mode: Auditory Comprehension: 4-Understands basic 75 - 89% of the time/requires cueing 10 - 24% of the time  Expression Expression Mode: Verbal Expression: 5-Expresses basic needs/ideas: With  extra time/assistive device  Social Interaction Social Interaction: 6-Interacts appropriately with others with medication or extra time (anti-anxiety, antidepressant).  Problem Solving Problem Solving: 4-Solves basic 75 - 89% of  the time/requires cueing 10 - 24% of the time  Memory Memory: 5-Recognizes or recalls 90% of the time/requires cueing < 10% of the time  Medical Problem List and Plan:  1. Cervical epidural abscess with tetraplegia status post decompression and fusion  2. DVT Prophylaxis/Anticoagulation: SCDs. Monitor for any signs of DVT  3. Pain Management/neuropathic pain: Neurontin 300 mg 3 times a day, Hydrocodone and Robaxin as needed.   -added low dose elavil at night as well to assist with neuropathic pain  -hydrocodone scheduled at 0600 and 1200 daily to synch with therapy activities 3. Mood/insomnia: Trazodone 50 mg each bedtime  5. Neuropsych: This patient is capable of making decisions on her own behalf.  6. ID/microaerophilic streptococci. Ceftriaxone x6 weeks through April 16. Followup infectious disease as outpt  -to OR today per NS 7. Hypertension. Lisinopril 5 mg daily, Lasix 20 mg daily. Monitor with increased mobility  8. Asthma. Dulera 2 puffs twice a day, Flonase daily. No complaints of shortness of breath. Continue CPAP  9. Urinary frequency. Ditropan 2.5 mg twice a day.  Follow up urine with yeast  -added diflucan 10. Obesity. History of gastric bypass  LOS (Days) 6 A FACE TO FACE EVALUATION WAS PERFORMED  Trysten Bernard T 07/28/2013 7:32 AM

## 2013-07-28 NOTE — Op Note (Addendum)
07/28/2013  4:24 PM  PATIENT:  [name]  79 y.o. female  PRE-OPERATIVE DIAGNOSIS:  epidural spinal abscess with spinal cord compression and quadriparesis  POST-OPERATIVE DIAGNOSIS: epidural spinal abscess with spinal cord compression and quadriparesis  PROCEDURE:  Procedure(s): Cervical four-seven  posterior cervical fusion (N/A) with lateral mass screws, posterolateral arthrodesis C 4 - C 7 levels and cervical laminectomy C 3 - T 1 levels  SURGEON:  Surgeon(s) and Role:    * Erline Levine, MD - Primary    * Winfield Cunas, MD - Assisting  PHYSICIAN ASSISTANT:   ASSISTANTS: [place], RN   ANESTHESIA:   general  EBL:  Total I/O In: 1100 [I.V.:1100] Out: 1150 [Urine:950; Blood:200]  BLOOD ADMINISTERED:none  DRAINS: (Medium) Hemovact drain(s) in the epidural space with  Suction Open   LOCAL MEDICATIONS USED:  LIDOCAINE   SPECIMEN:  No Specimen  DISPOSITION OF SPECIMEN:  N/A  COUNTS:  YES  TOURNIQUET:  * No tourniquets in log *  DICTATION: Patient is 79 year old woman with epidural spinal abscess and quadriparesis, cervical stenosis.  She had significant improvement after emergent ACDF C 45, C 56, C 67 levels, but then had gradual deterioration of neurological function and repeat MRI showed cord compression. It was elected to take the patient to surgery for posterior cervical decompression and fusion. She is morbidly obese.  PROCEDURE: Patient was brought to the OR and following smooth and uncomplicated induction of GETA , patient was placed in 3 pin fixation and rolled into a prone position on the OR table in the cervical collar.  Shoulders were taped to facilitate Xray visualization. Posterior neck was shaved with clippers, prepped and draped in the usual fashion with betadine scrub followed by Duraprep.  Area of planned incision was infiltrated with local lidocaine.  Incision was made from C3-T1 and carried through the avascular midline plane to expose these lavels and  their lateral masses.  There was significant arthritis at each of these levels with facet arthropathy and bony excrescences.  Lateral mass screws were placed from C4 through C7 bilaterally according to standard landmarks and their positioning was confirmed with fluoroscopy.  The facet joints and laminae were decorticated.  Screws and rods were locked down in situ.  The spinous processes of C4-C7 were then removed and the laminae were thinned with a high speed drill. A total laminectomy was performed with undercutting of the C3 lamina all the way to the top of T1 and the foraminae were also decompressed at each level. The spinal cord dura appeared to be pulsatile and well decompressed. There was significant dural thickening and granulation tissue without frank pus at the C 6 - T 1 levels.  Hemostasis was assured and a medium Hemovac drain was placed in the epidural space. Wound was closed with 0, 2-0, and 3-0 vicryl sutures were used to re approximate the skin edges. Sterile occlusive dressing was placed.  Patient was taken out of pins and turned back onto the OR table.  Patient was extubated in the operating room and taken to recovery in stable and satisfactory condition.  Counts were correct at the end of the case.  PLAN OF CARE: Admit to inpatient   PATIENT DISPOSITION:  PACU - hemodynamically stable.   Delay start of Pharmacological VTE agent (>24hrs) due to surgical blood loss or risk of bleeding: yes

## 2013-07-28 NOTE — Anesthesia Postprocedure Evaluation (Signed)
  Anesthesia Post-op Note  Patient: [name]  Procedure(s) Performed: Procedure(s): Cervical four-seven  posterior cervical fusion (N/A)  Patient Location: PACU  Anesthesia Type:General  Level of Consciousness: awake, oriented, sedated and patient cooperative  Airway and Oxygen Therapy: Patient Spontanous Breathing  Post-op Pain: mild  Post-op Assessment: Post-op Vital signs reviewed, Patient's Cardiovascular Status Stable, Respiratory Function Stable, Patent Airway, No signs of Nausea or vomiting and Pain level controlled  Post-op Vital Signs: stable  Complications: No apparent anesthesia complications

## 2013-07-28 NOTE — Anesthesia Preprocedure Evaluation (Signed)
Anesthesia Evaluation  Patient identified by MRN, date of birth, ID band Patient awake    Reviewed: Allergy & Precautions, H&P , NPO status , Patient's Chart, lab work & pertinent test results  Airway       Dental   Pulmonary asthma , sleep apnea ,          Cardiovascular hypertension,     Neuro/Psych    GI/Hepatic   Endo/Other  Morbid obesity  Renal/GU      Musculoskeletal   Abdominal   Peds  Hematology   Anesthesia Other Findings   Reproductive/Obstetrics                           Anesthesia Physical Anesthesia Plan  ASA: III  Anesthesia Plan: General   Post-op Pain Management:    Induction: Intravenous  Airway Management Planned: Oral ETT  Additional Equipment:   Intra-op Plan:   Post-operative Plan: Extubation in OR  Informed Consent: I have reviewed the patients History and Physical, chart, labs and discussed the procedure including the risks, benefits and alternatives for the proposed anesthesia with the patient or authorized representative who has indicated his/her understanding and acceptance.     Plan Discussed with:   Anesthesia Plan Comments:         Anesthesia Quick Evaluation

## 2013-07-28 NOTE — Discharge Summary (Signed)
[name], [name] NO.:  [id number]  MEDICAL RECORD NO.:  [id number]  LOCATION:  [id number]                        FACILITY:  [facility]  PHYSICIAN:  Meredith Staggers, M.D.DATE OF BIRTH:  Apr 08, 1935  DATE OF ADMISSION:  07/22/2013 DATE OF DISCHARGE:  07/28/2013                              DISCHARGE SUMMARY   DISCHARGE DIAGNOSES: 1. Cervical epidural abscess with tetraplegia, status post     decompression and fusion. 2. Sequential compression devices for DVT prophylaxis, pain     management, mood with insomnia, hypertension, asthma, urinary     frequency, and obesity.  HISTORY OF PRESENT ILLNESS:  This is a 79 year old right-handed female with history of hypertension and COPD, who was admitted on July 16, 2013, with progressive quadriparesis x3 days.  Denied any bowel or bladder disturbances other than some urinary frequency.  Low-grade fever of 102.2.  White blood cell count 14,000.  MRI imaging revealed cervical epidural abscess.  Underwent emergent anterior cervical decompression diskectomy with fusion, multilevel C4-C7 on July 16, 2013, per Dr. Vertell Limber.  Decadron protocol as advised.  Follow up with Infectious Disease with spinal abscess fluid growing microaerophilic streptococci and maintained on ceftriaxone as well as vancomycin.  Vancomycin discontinued on July 19, 2013, advised ceftriaxone through April 16th. Postoperative pain management.  Physical and occupational therapy ongoing.  The patient was admitted for comprehensive rehab program.  PAST MEDICAL HISTORY:  See discharge diagnoses.  SOCIAL HISTORY:  Lives alone.  FUNCTIONAL HISTORY:  Prior to admission, independent until the past week with progressive quadriparesis.  Functional mobility upon admission to rehab, the patient ambulating 80 feet with fatigue factors, sluggish, flexed trunk hips and knees, having increased difficulty with postural stability.  Moderate assist toilet transfers and max  assist lower body dressing.  PHYSICAL EXAMINATION:  VITAL SIGNS:  Blood pressure 179/82, pulse 78, temperature 98.2, respirations 18. GENERAL:  This was an alert female, mildly anxious, oriented x3. HEENT:  Pupils round and reactive to light. LUNGS:  Clear to auscultation. CARDIAC:  Regular rate and rhythm. ABDOMEN:  Soft, nontender.  Good bowel sounds.  Surgical site clean and dry.  [place] COURSE:  The patient was admitted to inpatient rehab services with therapies initiated on a 3-hour daily basis consisting of physical therapy, occupational therapy, and rehabilitation nursing.  The following issues were addressed during the patient's rehabilitation stay.  Pertaining to [name]'s cervical epidural abscess with tetraplegia status post decompression and fusion. Surgical site healing nicely.  She was attending therapies with slow progress and sequential compression device in place for DVT prophylaxis. Pain management with the use of Neurontin as well as hydrocodone and Robaxin for breakthrough pain with the addition of Elavil at night time for neuropathic pain.  She remained in ceftriaxone x6 weeks through August 26, 2013, per Infectious Disease for microaerophilic streptococci. The patient with progressive weakness to her hands.  X-rays and imaging of her cervical spine showed abnormal dural thickening enhancement from C3-C4 as well as including upper thoracic spine.  Small ventral and dorsal epidural abscesses at C6-7.  Neurosurgery, Dr. Erline Levine notified a AP and lateral cervical spine completed showed some swelling of the prevertebral soft tissues anterior C3  and 4 and postoperative changes related to recent ACDF from C4-C7 as well.  Due to the patient's progressively getting weaker and diminished triceps and hand intrinsic strength, it was felt the patient should return to the operating room under the care of Neurosurgery for decompression  laminectomy from C4-C7 with posterior fixation.  Arrangements were made for discharge to [facility] and surgical intervention.  All medication changes were made as per Neurosurgery.     [name], P.A.   ______________________________ Meredith Staggers, M.D.    DA/MEDQ  D:  07/28/2013  T:  07/28/2013  Job:  [id number]  cc:   Marchia Meiers. Vertell Limber, M.D.

## 2013-07-28 NOTE — Plan of Care (Signed)
Problem: SCI BLADDER ELIMINATION Goal: RH STG MANAGE BLADDER WITH ASSISTANCE STG Manage Bladder With mod Assistance  Outcome: Not Progressing Patient with urinary retention. I/o cath if no void in 8 hrs. adm

## 2013-07-28 NOTE — Transfer of Care (Signed)
Immediate Anesthesia Transfer of Care Note  Patient: [name]  Procedure(s) Performed: Procedure(s): Cervical four-seven  posterior cervical fusion (N/A)  Patient Location: PACU  Anesthesia Type:General  Level of Consciousness: awake, alert , oriented and sedated  Airway & Oxygen Therapy: Patient Spontanous Breathing and Patient connected to face mask oxygen  Post-op Assessment: Report given to PACU RN, Post -op Vital signs reviewed and stable and Patient moving all extremities  Post vital signs: Reviewed and stable  Complications: No apparent anesthesia complications

## 2013-07-28 NOTE — Progress Notes (Signed)
Occupational Therapy Session Note  Patient Details  Name: [name] MRN: [id number] Date of Birth: 1935-01-04  Today's Date: 07/28/2013  Short Term Goals: Week 1:  OT Short Term Goal 1 (Week 1): Patient will complete lower body dressing with mod assist. OT Short Term Goal 2 (Week 1): Patient will complete toileting with min assist for thoroughness with toilet hygiene OT Short Term Goal 3 (Week 1): Patient will perform tranfer to tub bench with superviision OT Short Term Goal 4 (Week 1): Patient will demo improved [place] of bil hands as evidenced by ability open containers independently during self-feeding OT Short Term Goal 5 (Week 1): Patient will demonstrate ability to complete BUE HEP with supervision  Skilled Therapeutic Interventions/Progress Updates:  Per MD verbal order, no therapies scheduled this date.     Therapy Documentation Precautions:  Precautions Precautions: Fall Restrictions Weight Bearing Restrictions: No  Vital Signs: Therapy Vitals Temp: 97.5 F (36.4 C) Temp src: Oral Pulse Rate: 87 Resp: 18 BP: 148/76 mmHg Patient Position, if appropriate: Lying Oxygen Therapy SpO2: 91 % O2 Device: None (Room air)  ADL: ADL ADL Comments: see FIM  See FIM for current functional status  Dorien Bessent 07/28/2013, 7:21 AM

## 2013-07-28 NOTE — Discharge Summary (Signed)
Discharge summary job [id number]

## 2013-07-28 NOTE — Patient Care Conference (Signed)
Inpatient RehabilitationTeam Conference and Plan of Care Update Date: 07/27/2013   Time: 2:30 PM    Patient Name: [name]      Medical Record Number: [id number]  Date of Birth: 12-28-1934 Sex: Female         Room/Bed: 4M09C/4M09C-01 Payor Info: Payor: [employer] MEDICARE / Plan: [employer] / Product Type: *No Product type* /    Admitting Diagnosis: EPIDURAL SPINAL ABSCESS ACDF Cervical abscess  Admit Date/Time:  07/22/2013  2:49 PM Admission Comments: No comment available   Primary Diagnosis:  <principal problem not specified> Principal Problem: <principal problem not specified>  Patient Active Problem List   Diagnosis Date Noted  . Epidural abscess 07/22/2013  . Spinal epidural abscess 07/16/2013  . Acute respiratory failure 07/16/2013  . OSA on CPAP 07/16/2013    Expected Discharge Date: Expected Discharge Date:  (anticipate SNF after CIR)  Team Members Present: Physician leading conference: Dr. Alger Simons Social Worker Present: Lennart Pall, LCSW Nurse Present: Elliot Cousin, RN PT Present: Canary Brim, Rayburn Ma, PT OT Present: Chrys Racer, Starling Manns, OT;Frank Tarrant, OT PPS Coordinator present : Daiva Nakayama, RN, CRRN;Becky Alwyn Ren, PT     Current Status/Progress Goal Weekly Team Focus  Medical   decline neurologically over the weekend with more difficulty standing and weakness in right greater than left uppers  stabiliz medically  surtical re-evaluation, likely re-exploration/surgery   Bowel/Bladder   Conetinent of bowel, patient has urgency with urination, patient required in and out cath for scans above 700  Continent of bowel and bladder  PVR q8 hours   Swallow/Nutrition/ Hydration             ADL's   Min A upper body B & D, Total A lower body, Mod-Max A transfers, Mod A sit <> stand  Min A B & D and dynamic standing balance, supervision transfers, Mod I toileting  Transfers, bil hand strengthening, endurance, static standing    Mobility   Mod assist sit <> stand, recent decreased ambulation distance  supervision  Standing endurance, balance, safety with transfers and mobility, standing balance.     Communication             Safety/Cognition/ Behavioral Observations            Pain   Pain level at 5 out of 10 with scheduled and prn pain meds on board  Pain level less than or equal to 4  Monitor pain and treatment prn    Skin   Incision to left neck with dermbond, bruise to left arm, bottom red microguard powder in use  No new skin breakdown/infection   Monitor skin q shift and prn      *See Care Plan and progress notes for long and short-term goals.  Barriers to Discharge: worsening neuro deficits    Possible Resolutions to Barriers:  surgery follow up    Discharge Planning/Teaching Needs:  home with family to assist vs possible SNF dependent on progress      Team Discussion:  Decline in overall function and now total assist.  Awaiting radiology reports.  May need further surgical interventions.  Await NS feedback.    Revisions to Treatment Plan:  Holding tx as we await medical consults.   Continued Need for Acute Rehabilitation Level of Care: The patient requires daily medical management by a physician with specialized training in physical medicine and rehabilitation for the following conditions: Daily direction of a multidisciplinary physical rehabilitation program to ensure safe treatment while eliciting  the highest outcome that is of practical value to the patient.: Yes Daily medical management of patient stability for increased activity during participation in an intensive rehabilitation regime.: Yes Daily analysis of laboratory values and/or radiology reports with any subsequent need for medication adjustment of medical intervention for : Neurological problems;Post surgical problems;Other  Phillip Maffei 07/28/2013, 11:26 AM

## 2013-07-29 ENCOUNTER — Encounter (HOSPITAL_COMMUNITY): Payer: Self-pay | Admitting: Neurology

## 2013-07-29 NOTE — Clinical Social Work Note (Signed)
Clinical Social Worker received referral for possible ST-SNF placement.  Chart reviewed.  PT/OT recommending return to inpatient rehab.  Spoke with inpatient rehab admissions coordinator who will follow up with patient to discuss rehab needs and discharge.  Inpatient rehab admissions coordinator has submitted for insurance authorization.    CSW signing off - please re consult if social work needs arise.  Barbette Or, [place]

## 2013-07-29 NOTE — Progress Notes (Signed)
Rehab Admissions Coordinator Note:  Patient was screened by Retta Diones for appropriateness for an Inpatient Acute Rehab Consult. Patient will known to me from previous inpatient rehab stay.  I will re-open the case with [employer] and seek re-authorization for acute inpatient rehab admission.  Call me for questions.  Retta Diones 07/29/2013, 12:55 PM  I can be reached at [phone].

## 2013-07-29 NOTE — Progress Notes (Signed)
Occupational Therapy Evaluation - Re evaluation Patient Details Name: [name] MRN: [id number] DOB: Nov 06, 1934 Today's Date: 07/29/2013 Time: 7616-0737 OT Time Calculation (min): 42 min  OT Assessment / Plan / Recommendation History of present illness 79 yo female admitted with spinal epidural abscess undergoing ACDF.  Pt transferred to CIR, developed increased weakness. Underwent  cervical four-seven  posterior cervical fusion (N/A) with lateral mass screws, posterolateral arthrodesis C 4 - C 7 levels and cervical laminectomy C 3 - T 1 levels 3/18.    Clinical Impression   Pt reports improvement as compared to weakness she was experiencing prior to most recent surgery. RUE/LE weaker than L overall. Pt presents with the below deficits and would benefit from returning to CIR to maximize functional level of independence with ADL and mobility for ADL to facilitate safe return home with supportive family. Pt very motivated to get OOB to chair this amd and was excited about eating/feeding herself. Will continue to follow to facilitate D/C to CIR.    OT Assessment  Patient needs continued OT Services    Follow Up Recommendations  CIR;Supervision/Assistance - 24 hour    Barriers to Discharge      Equipment Recommendations  Other (comment) (will further assess)    Recommendations for Other Services Rehab consult  Frequency  Min 3X/week    Precautions / Restrictions Precautions Precautions: Fall;Cervical Precaution Comments: parasthesias Required Braces or Orthoses: Cervical Brace Cervical Brace: Hard collar;At all times Restrictions Weight Bearing Restrictions: No Other Position/Activity Restrictions: no lifting  or heavy pulling/pushing   Pertinent Vitals/Pain C/o pain neck/upper back/ Pt repositioned/heat to upper traps.     ADL  Eating/Feeding: Set up;Supervision/safety (rec small bites followed by liquids) Where Assessed - Eating/Feeding: Chair Grooming: Moderate  assistance Where Assessed - Grooming: Supported sitting Upper Body Bathing: Moderate assistance Where Assessed - Upper Body Bathing: Supported sitting Lower Body Bathing: +1 Total assistance Where Assessed - Lower Body Bathing: Supported sit to stand Upper Body Dressing: Maximal assistance Where Assessed - Upper Body Dressing: Supported sitting Lower Body Dressing: +1 Total assistance Where Assessed - Lower Body Dressing: Supported sit to Tree surgeon Transfer: +2 Total assistance;Moderate assistance Toilet Transfer Method: Stand pivot Toileting - Clothing Manipulation and Hygiene: +1 Total assistance (foley) Equipment Used: Gait belt Transfers/Ambulation Related to ADLs: +2 total A with mod A. Diffficulty maintinaing upright trunk. Able to stand step to chair. RLE increaed weakness. No buckoing of knees. ADL Comments: increaed independence as compared to prior to last surgery. Pt able to use standard call bell to call for nurse and change channel    OT Diagnosis: Generalized weakness;Acute pain;Paresis  OT Problem List: Decreased strength;Decreased range of motion;Decreased activity tolerance;Impaired balance (sitting and/or standing);Decreased coordination;Decreased safety awareness;Decreased knowledge of use of DME or AE;Decreased knowledge of precautions;Impaired sensation;Impaired tone;Obesity;Impaired UE functional use;Pain;Increased edema OT Treatment Interventions: Self-care/ADL training;Therapeutic exercise;Neuromuscular education;Energy conservation;DME and/or AE instruction;Therapeutic activities;Patient/family education;Balance training   OT Goals(Current goals can be found in the care plan section) Acute Rehab OT Goals Patient Stated Goal: motivated to get better OT Goal Formulation: With patient/family Time For Goal Achievement: 08/12/13 Potential to Achieve Goals: Good  Visit Information  Last OT Received On: 07/29/13 Assistance Needed: +2 PT/OT/SLP  Co-Evaluation/Treatment: Yes OT goals addressed during session: ADL's and self-care History of Present Illness: 79 yo female admitted with spinal epidural abscess undergoing ACDF.  Pt transferred to CIR, developed increased weakness. Underwent  cervical four-seven  posterior cervical fusion (N/A) with lateral mass screws, posterolateral arthrodesis C  4 - C 7 levels and cervical laminectomy C 3 - T 1 levels 3/18.        Prior [place] expects to be discharged to:: Inpatient rehab Prior Function Level of Independence: Independent Comments: Read, sew, cook - likes baking cakes and perfecting chili Communication Communication: No difficulties Dominant Hand: Left         Vision/Perception Vision - History Baseline Vision: Wears glasses all the time   Cognition  Cognition Arousal/Alertness: Awake/alert Behavior During Therapy: WFL for tasks assessed/performed Overall Cognitive Status: Within Functional Limits for tasks assessed    Extremity/Trunk Assessment Upper Extremity Assessment Upper Extremity Assessment: RUE deficits/detail;LUE deficits/detail RUE Deficits / Details: Experiencing muscular pain which makes assessing shoulder strength difficult. Appears weaker R shoulder. Deltoids @2 /5. Unable to demonstrate shoulder flex. elbow flex 3+/5. Elbow extension 2/5. wrist flex/ext 3/5. gross grasp/release. grasp 2/5. no isolated finger movement - weak intrinsics. extensor lag all digits - greater weakness index. Lateral pinch. able to oppose thumb to index only. 2/5. using R hand as gross assist RUE: Unable to fully assess due to pain (shoulder) RUE Sensation: decreased light touch;decreased proprioception RUE Coordination: decreased fine motor;decreased gross motor LUE Deficits / Details: shoulder flex @ 45. 3/5. deltoids @ 3+/5. elbow flexion/extension 3+/5. wrist flex/ext and gross grasp/relese 3+/5. isonlated finger movements. 3+/5. Able to oppose  thumb to all digits. Using L hand to hold utensils and manage ADL with increased dexterity. LUE: Unable to fully assess due to pain (shoulder/cervical) LUE Sensation: decreased light touch LUE Coordination: decreased fine motor;decreased gross motor Lower Extremity Assessment Lower Extremity Assessment: Defer to PT evaluation Cervical / Trunk Assessment Cervical / Trunk Assessment: Kyphotic;Other exceptions Cervical / Trunk Exceptions: trunkal ataxia. unable to maintain upright midline posture. Using BUE for propping with mod A at times to maintain. Anterior bias.     Mobility Bed Mobility Overal bed mobility: +2 for physical assistance;Needs Assistance Bed Mobility: Sidelying to Sit Rolling: +2 for physical assistance;Max assist Sidelying to sit: +2 for physical assistance;Max assist;HOB elevated Transfers Overall transfer level: Needs assistance Equipment used: 2 person hand held assist Transfers: Sit to/from Stand;Stand Pivot Transfers Sit to Stand: +2 physical assistance;Mod assist Stand pivot transfers: +2 physical assistance;Mod assist General transfer comment: c/o pain i shoulders/L arm during trnafers. Difficulty with maintaining trunk control. Asist to facilitate weight shift to stand. however, pt able to assist with stand step to chair.     Exercise Other Exercises Other Exercises: theraputty ex   Balance Balance Overall balance assessment: Needs assistance Sitting-balance support: Feet supported;Bilateral upper extremity supported Sitting balance-Leahy Scale: Poor Postural control: Other (comment) (anterior bias) Standing balance support: Bilateral upper extremity supported;During functional activity Standing balance-Leahy Scale: Zero   End of Session OT - End of Session Equipment Utilized During Treatment: Gait belt Activity Tolerance: Patient tolerated treatment well Patient left: in chair;with call bell/phone within reach;with family/visitor present;with  nursing/sitter in room Nurse Communication: Mobility status;Precautions;Weight bearing status  GO     Montarius Kitagawa,[place] 07/29/2013, 10:13 AM Maurie Boettcher, OTR/L  [phone] 07/29/2013

## 2013-07-29 NOTE — Evaluation (Signed)
Physical Therapy Evaluation Patient Details Name: [name] MRN: [id number] DOB: 07/12/1934 Today's Date: 07/29/2013 Time: 2423-5361 PT Time Calculation (min): 41 min  PT Assessment / Plan / Recommendation History of Present Illness  79 yo female admitted with spinal epidural abscess undergoing ACDF.  Pt transferred to CIR, developed increased weakness. Underwent  cervical four-seven  posterior cervical fusion (N/A) with lateral mass screws, posterolateral arthrodesis C 4 - C 7 levels and cervical laminectomy C 3 - T 1 levels 3/18.   Clinical Impression  Pt admitted with above. Pt currently with functional limitations due to the deficits listed below (see PT Problem List). Pt anxious to get back to Rehab.   Pt will benefit from skilled PT to increase their independence and safety with mobility to allow discharge to the venue listed below.     PT Assessment  Patient needs continued PT services    Follow Up Recommendations  CIR;Supervision/Assistance - 24 hour    Does the patient have the potential to tolerate intense rehabilitation      Barriers to Discharge Decreased caregiver support      Equipment Recommendations  Other (comment) (TBA)    Recommendations for Other Services Rehab consult   Frequency Min 5X/week    Precautions / Restrictions Precautions Precautions: Fall;Cervical Precaution Comments: parasthesias Required Braces or Orthoses: Cervical Brace Cervical Brace: Hard collar;At all times Restrictions Weight Bearing Restrictions: No Other Position/Activity Restrictions: no lifting  or heavy pulling/pushing   Pertinent Vitals/Pain VSS, pain 10/10 per pt in posterior neck.       Mobility  Bed Mobility Overal bed mobility: +2 for physical assistance;Needs Assistance Bed Mobility: Sidelying to Sit Rolling: +2 for physical assistance;Max assist Sidelying to sit: +2 for physical assistance;Max assist;HOB elevated General bed mobility comments: Pt needed cues  for sequencing and assist to complete transitional movements due to pain and weakness.  Pt trying to use momentum and weight shifting to move hips to EOB but needed assist as well.    Transfers Overall transfer level: Needs assistance Equipment used: 2 person hand held assist Transfers: Sit to/from [employer] Sit to Stand: +2 physical assistance;Mod assist Stand pivot transfers: +2 physical assistance;Mod assist General transfer comment: c/o pain i shoulders/L arm during trnafers. Difficulty with maintaining trunk control. Asist to facilitate weight shift to stand. however, pt able to assist with stand step to chair.    Exercises General Exercises - Lower Extremity Ankle Circles/Pumps: AROM;Both;10 reps;Seated Long Arc Quad: AROM;Both;10 reps;Seated Other Exercises Other Exercises: theraputty ex   PT Diagnosis: Generalized weakness  PT Problem List: Decreased activity tolerance;Decreased strength;Decreased balance;Decreased mobility;Decreased knowledge of use of DME;Decreased coordination;Decreased knowledge of precautions;Decreased safety awareness PT Treatment Interventions: DME instruction;Gait training;Functional mobility training;Therapeutic exercise;Therapeutic activities;Balance training;Patient/family education;Cognitive remediation     PT Goals(Current goals can be found in the care plan section) Acute Rehab PT Goals Patient Stated Goal: motivated to get better PT Goal Formulation: With patient Time For Goal Achievement: 08/05/13 Potential to Achieve Goals: Good  Visit Information  Last PT Received On: 07/29/13 Assistance Needed: +2 PT/OT/SLP Co-Evaluation/Treatment: Yes Reason for Co-Treatment: Complexity of the patient's impairments (multi-system involvement) PT goals addressed during session: Mobility/safety with mobility OT goals addressed during session: ADL's and self-care History of Present Illness: 79 yo female admitted with spinal epidural abscess  undergoing ACDF.  Pt transferred to CIR, developed increased weakness. Underwent  cervical four-seven  posterior cervical fusion (N/A) with lateral mass screws, posterolateral arthrodesis C 4 - C 7 levels and cervical  laminectomy C 3 - T 1 levels 3/18.        Prior [place] expects to be discharged to:: Inpatient rehab Living Arrangements: Alone Available Help at Discharge: Family;Available 24 hours/day Type of Home: House Home Access: Stairs to enter [employer] of Steps: 3 Entrance Stairs-Rails: Left;Right Home Layout: One level Home Equipment: Shower seat;Tub bench;Walker - 2 wheels;Walker - 4 wheels;Transport chair;Bedside commode;Grab bars - tub/shower;Grab bars - toilet;Hand held shower head;Cane - single point (base for recliner) Additional Comments: baseline patient uses no DME but has DME from bil knee surg in the past - shower chair  Lives With: Alone (with Dog) Prior Function Level of Independence: Independent Comments: Read, sew, cook - likes baking cakes and perfecting chili Communication Communication: No difficulties Dominant Hand: Left    Cognition  Cognition Arousal/Alertness: Awake/alert Behavior During Therapy: WFL for tasks assessed/performed Overall Cognitive Status: Within Functional Limits for tasks assessed    Extremity/Trunk Assessment Upper Extremity Assessment Upper Extremity Assessment: Defer to OT evaluation RUE Deficits / Details: Experiencing muscular pain which makes assessing shoulder strength difficult. Appears weaker R shoulder. Deltoids @2 /5. Unable to demonstrate shoulder flex. elbow flex 3+/5. Elbow extension 2/5. wrist flex/ext 3/5. gross grasp/release. grasp 2/5. no isolated finger movement - weak intrinsics. extensor lag all digits - greater weakness index. Lateral pinch. able to oppose thumb to index only. 2/5. using R hand as gross assist RUE: Unable to fully assess due to pain (shoulder) RUE  Sensation: decreased light touch;decreased proprioception RUE Coordination: decreased fine motor;decreased gross motor LUE Deficits / Details: shoulder flex @ 45. 3/5. deltoids @ 3+/5. elbow flexion/extension 3+/5. wrist flex/ext and gross grasp/relese 3+/5. isonlated finger movements. 3+/5. Able to oppose thumb to all digits. Using L hand to hold utensils and manage ADL with increased dexterity. LUE: Unable to fully assess due to pain (shoulder/cervical) LUE Sensation: decreased light touch LUE Coordination: decreased fine motor;decreased gross motor Lower Extremity Assessment Lower Extremity Assessment: RLE deficits/detail;LLE deficits/detail RLE Deficits / Details: Hip 2+/5, knee3-/5, ankle 2+/5 RLE Sensation: decreased proprioception RLE Coordination: decreased gross motor;decreased fine motor LLE Deficits / Details: grossly 3/5 LLE Sensation: decreased light touch;decreased proprioception Cervical / Trunk Assessment Cervical / Trunk Assessment: Kyphotic;Other exceptions Cervical / Trunk Exceptions: trunkal ataxia. unable to maintain upright midline posture. Using BUE for propping with mod A at times to maintain. Anterior bias.   Balance Balance Overall balance assessment: Needs assistance;History of Falls Sitting-balance support: Bilateral upper extremity supported;Feet supported Sitting balance-Leahy Scale: Poor Sitting balance - Comments: Leaning anteriorly significantly needing cues with pt unable to right correctly.   Postural control: Other (comment) (Anterior bias) Standing balance support: Bilateral upper extremity supported;During functional activity Standing balance-Leahy Scale: Zero Standing balance comment: requires bil UE support.  Leans anteriorly.   End of Session PT - End of Session Equipment Utilized During Treatment: Gait belt;Oxygen Activity Tolerance: Patient limited by fatigue Patient left: in chair;with call bell/phone within reach;with family/visitor  present Nurse Communication: Mobility status;Need for lift equipment  GP     INGOLD,Jullian Clayson 07/29/2013, 11:00 AM [facility] Acute Rehabilitation [phone] (pager)

## 2013-07-29 NOTE — Progress Notes (Signed)
PT Cancellation Note  Patient Details Name: [name] MRN: [id number] DOB: 08-02-1934   Cancelled Treatment:     Per MD verbal order no therapies today.    Ladona Horns Cheek 07/29/2013, 7:04 AM

## 2013-07-29 NOTE — Progress Notes (Signed)
Subjective: Patient reports feeling better.  Stronger and less numbness.  Able to bear weight and walk.  Objective: Vital signs in last 24 hours: Temp:  [97.8 F (36.6 C)-99 F (37.2 C)] 97.9 F (36.6 C) (03/19 0700) Pulse Rate:  [73-106] 73 (03/19 0900) Resp:  [10-21] 18 (03/19 0900) BP: (114-186)/(57-96) 157/72 mmHg (03/19 0800) SpO2:  [90 %-96 %] 96 % (03/19 0900) Weight:  [79.6 kg (175 lb 7.8 oz)] 79.6 kg (175 lb 7.8 oz) (03/18 1800)  Intake/Output from previous day: 03/18 0701 - 03/19 0700 In: 1600 [I.V.:1100; IV Piggyback:100] Out: 1840 [Urine:1550; Drains:90; Blood:200] Intake/Output this shift: Total I/O In: 150 [I.V.:150] Out: -   Physical Exam: Patient is complaining of neck pain, but is stronger than preoperative exam with full bilateral deltoids (pain limited on right), biceps, persistent weakness, but improved triceps and grips.  Left arm stronger than right.  Right leg is also improved with improved dorsiflexion and plantar flexion bilaterally.  Wound CDI.  Lab Results:  Recent Labs  07/26/13 1415 07/27/13 0512  WBC 12.2* 9.6  HGB 10.8* 9.8*  HCT 33.7* 31.3*  PLT 342 317   BMET No results found for this basename: NA, K, CL, CO2, GLUCOSE, BUN, CREATININE, CALCIUM,  in the last 72 hours  Studies/Results: Dg Cervical Spine 2-3 Views  07/28/2013   CLINICAL DATA:  C4-7 posterior cervical fusion.  EXAM: CERVICAL SPINE - 2-3 VIEW; DG C-ARM 1-60 MIN  COMPARISON:  DG CERVICAL SPINE 2-3 VIEWS dated 07/27/2013  FINDINGS: Three spot lateral fluoroscopic images of the cervical spine are submitted. Bone detail is limited. Based on the preexisting anterior plate and screws extending from C4 through C7, these views demonstrate the placement of pedicle screws at the same levels. The interconnecting rods have not yet been placed. No complications are identified.  IMPRESSION: Intraoperative views during C4-7 posterior fusion. Bone detail is limited.   Electronically Signed   By:  Camie Patience M.D.   On: 07/28/2013 16:45   Dg Cervical Spine 2 Or 3 Views  07/27/2013   CLINICAL DATA:  Postoperative abscess.  EXAM: CERVICAL SPINE - 2-3 VIEW  COMPARISON:  MRI of the cervical spine 07/26/2013.  FINDINGS: Postoperative changes of ACDF from C4-C7 are noted, with interbody grafts at C4-C5, C5-C6 and C6-C7. Evaluation of the cervical spine below the level C6 is limited on today's examination secondary to overlying soft tissues. Straightening of normal cervical lordosis. Prevertebral soft tissue swelling anterior to C3-C4. Multilevel facet arthropathy in degenerative disc disease.  IMPRESSION: 1. Postoperative changes related to recent ACDF from C4-C7, as above, with some swelling of the prevertebral soft tissues anterior to C3-C4.   Electronically Signed   By: Vinnie Langton M.D.   On: 07/27/2013 15:45   Dg C-arm 1-60 Min  07/28/2013   CLINICAL DATA:  C4-7 posterior cervical fusion.  EXAM: CERVICAL SPINE - 2-3 VIEW; DG C-ARM 1-60 MIN  COMPARISON:  DG CERVICAL SPINE 2-3 VIEWS dated 07/27/2013  FINDINGS: Three spot lateral fluoroscopic images of the cervical spine are submitted. Bone detail is limited. Based on the preexisting anterior plate and screws extending from C4 through C7, these views demonstrate the placement of pedicle screws at the same levels. The interconnecting rods have not yet been placed. No complications are identified.  IMPRESSION: Intraoperative views during C4-7 posterior fusion. Bone detail is limited.   Electronically Signed   By: Camie Patience M.D.   On: 07/28/2013 16:45    Assessment/Plan: Improving neurologic exam, having postoperative neck  pain, which should improve.  Drain output 90 cc, will leave in today.  Patient can resume aggressive PT and may transfer back to Rehab today or to floor, depending on bed availability.    LOS: 1 day    Peggyann Shoals, MD 07/29/2013, 9:15 AM

## 2013-07-29 NOTE — Progress Notes (Signed)
Pt rates pain 10 of 10 complains of c-collar itching and poking her, patient repositioned, family at bedside.  Pt has received valium this hour also for anxiety.  Will continue to monitor closely.

## 2013-07-29 NOTE — Progress Notes (Signed)
07/29/13 1300  PT Visit Information  Last PT Received On 07/29/13  Assistance Needed +2  History of Present Illness 79 yo female admitted with spinal epidural abscess undergoing ACDF.  Pt transferred to CIR, developed increased weakness. Underwent  cervical four-seven  posterior cervical fusion (N/A) with lateral mass screws, posterolateral arthrodesis C 4 - C 7 levels and cervical laminectomy C 3 - T 1 levels 3/18.   PT Time Calculation  PT Start Time 1155  PT Stop Time 1220  PT Time Calculation (min) 25 min  Subjective Data  Subjective "Oh, I am worse."  Patient Stated Goal motivated to get better  Precautions  Precautions Fall;Cervical  Precaution Comments parasthesias  Required Braces or Orthoses Cervical Brace  Cervical Brace Hard collar;At all times  Restrictions  Weight Bearing Restrictions No  Other Position/Activity Restrictions no lifting  or heavy pulling/pushing  Cognition  Arousal/Alertness Awake/alert  Behavior During Therapy WFL for tasks assessed/performed  Overall Cognitive Status Within Functional Limits for tasks assessed  Bed Mobility  Overal bed mobility +2 for physical assistance;Needs Assistance  Bed Mobility Sit to Supine  Sit to supine Max assist;+2 for physical assistance  Sit to sidelying Max assist;+2 for physical assistance  General bed mobility comments Pt needed cues for sequencing and assist to complete transitional movements due to pain and weakness.    Transfers  Overall transfer level Needs assistance  Equipment used Rolling walker (2 wheeled)  Transfers Sit to/from [employer] Transfers  Sit to Stand Max assist;+2 physical assistance  Stand pivot transfers Max assist;+2 physical assistance  General transfer comment Pt stood needing max assist from recliner with pt having difficulty trying to get to full upright stand.  Pt actually unable to stand fully secondary to anterior trunk lean which pt cannot correct even with UE support.  Pt  attempted to move LEs but could not move the right LE without assist and needed cues and physical assist for weight shifting as well.  Pt unable to sequence steps or RW with therapists moving pt.  All the while, pt with poorer and poorer trunk stabiility with pt flexed at trunk and unable to maintain upright stance.  Needed max assist by therapists to get pt to the bed safely .  Once sitting on bed, pt scooted to [facility] nicely with ability to lean forward and lift bottom off bed to scoot to [facility] needing min to mod assist to complete this movement.    Ambulation/Gait  Ambulation/Gait assistance Max assist;+2 physical assistance  Ambulation Distance (Feet) 2 Feet  Assistive device Rolling walker (2 wheeled)  Gait Pattern/deviations Step-to pattern;Decreased dorsiflexion - right;Steppage;Shuffle;Ataxic;Antalgic;Trunk flexed;Narrow base of support  Gait velocity decreased  Gait velocity interpretation Below normal speed for age/gender  General Gait Details Pt very unsteady on feet with poor overall postural stability.  Pt had difficulty moving LEs needing cues and asssit to move them as she was unable to move them.  Pt appears to have poor proprioception worse on right LE. Bil knees buckling needing max assist to assist pt to recover and stand.    Balance  Overall balance assessment Needs assistance;History of Falls  Postural control Other (comment) (anterior bias)  Standing balance support Bilateral upper extremity supported;During functional activity  Standing balance-Leahy Scale Zero  Standing balance comment Requiring bil UE support.  Leans anteriorly which worsens as pt fatigues.    PT - End of Session  Equipment Utilized During Treatment Gait belt  Activity Tolerance Patient limited by fatigue  Patient  left in bed;with call bell/phone within reach;with family/visitor present  Nurse Communication Mobility status;Need for lift equipment  PT - Assessment/Plan  PT Plan Current plan remains  appropriate  PT Frequency Min 5X/week  Recommendations for Other Services Rehab consult  Follow Up Recommendations CIR;Supervision/Assistance - 24 hour  PT equipment Other (comment) (TBA)  PT Goal Progression  Progress towards PT goals Progressing toward goals  PT General Charges  $$ ACUTE PT VISIT 1 Procedure  PT Treatments  $Therapeutic Activity 23-37 mins  [facility] Acute Rehabilitation [phone] (pager)

## 2013-07-30 ENCOUNTER — Encounter (HOSPITAL_COMMUNITY): Payer: Self-pay | Admitting: Neurosurgery

## 2013-07-30 DIAGNOSIS — G061 Intraspinal abscess and granuloma: Secondary | ICD-10-CM | POA: Diagnosis present

## 2013-07-30 MED ORDER — CALCIUM CARBONATE 1250 (500 CA) MG PO TABS
2.0000 | ORAL_TABLET | Freq: Every day | ORAL | Status: DC
  Administered 2013-07-31: 1000 mg via ORAL
  Filled 2013-07-30 (×2): qty 2

## 2013-07-30 MED ORDER — SENNA 8.6 MG PO TABS
1.0000 | ORAL_TABLET | Freq: Two times a day (BID) | ORAL | Status: DC
  Administered 2013-07-30 – 2013-07-31 (×2): 8.6 mg via ORAL
  Filled 2013-07-30 (×2): qty 1

## 2013-07-30 MED ORDER — CHILDRENS CHEWABLE MULTI VITS PO CHEW: 1.0000 | CHEWABLE_TABLET | Freq: Every day | ORAL | Status: DC

## 2013-07-30 MED ORDER — TRAZODONE HCL 50 MG PO TABS
50.0000 mg | ORAL_TABLET | Freq: Every day | ORAL | Status: DC
  Administered 2013-07-30: 50 mg via ORAL
  Filled 2013-07-30 (×2): qty 1

## 2013-07-30 MED ORDER — ACETAMINOPHEN 325 MG PO TABS: 650.0000 mg | ORAL_TABLET | ORAL | Status: DC | PRN

## 2013-07-30 MED ORDER — CALCIUM CARBONATE ANTACID 500 MG PO CHEW
1.5000 | CHEWABLE_TABLET | Freq: Every day | ORAL | Status: DC
  Administered 2013-07-30: 300 mg via ORAL
  Filled 2013-07-30 (×2): qty 1.5

## 2013-07-30 MED ORDER — HYDROCODONE-ACETAMINOPHEN 5-325 MG PO TABS: 1.0000 | ORAL_TABLET | ORAL | Status: DC | PRN

## 2013-07-30 MED ORDER — OXYBUTYNIN CHLORIDE 5 MG PO TABS
2.5000 mg | ORAL_TABLET | Freq: Two times a day (BID) | ORAL | Status: DC
  Administered 2013-07-30 – 2013-07-31 (×2): 2.5 mg via ORAL
  Filled 2013-07-30 (×3): qty 0.5

## 2013-07-30 MED ORDER — MOMETASONE FURO-FORMOTEROL FUM 100-5 MCG/ACT IN AERO
2.0000 | INHALATION_SPRAY | Freq: Two times a day (BID) | RESPIRATORY_TRACT | Status: DC
Start: 2013-07-30 — End: 2013-07-31
  Administered 2013-07-30 – 2013-07-31 (×2): 2 via RESPIRATORY_TRACT
  Filled 2013-07-30: qty 8.8

## 2013-07-30 MED ORDER — ONDANSETRON HCL 4 MG/2ML IJ SOLN: 4.0000 mg | Freq: Four times a day (QID) | INTRAMUSCULAR | Status: DC | PRN

## 2013-07-30 MED ORDER — VITAMIN D3 25 MCG (1000 UNIT) PO TABS
2000.0000 [IU] | ORAL_TABLET | Freq: Every day | ORAL | Status: DC
  Administered 2013-07-31: 2000 [IU] via ORAL
  Filled 2013-07-30: qty 2

## 2013-07-30 MED ORDER — SORBITOL 70 % SOLN
30.0000 mL | Freq: Every day | Status: DC | PRN
Start: 2013-07-30 — End: 2013-07-31
  Filled 2013-07-30: qty 30

## 2013-07-30 MED ORDER — DEXTROSE 5 % IV SOLN
2.0000 g | Freq: Two times a day (BID) | INTRAVENOUS | Status: DC
  Administered 2013-07-30 – 2013-07-31 (×2): 2 g via INTRAVENOUS
  Filled 2013-07-30 (×3): qty 2

## 2013-07-30 MED ORDER — BISACODYL 10 MG RE SUPP: 10.0000 mg | Freq: Every day | RECTAL | Status: DC | PRN

## 2013-07-30 MED ORDER — GABAPENTIN 100 MG PO CAPS
100.0000 mg | ORAL_CAPSULE | Freq: Three times a day (TID) | ORAL | Status: DC
  Administered 2013-07-30: 100 mg via ORAL
  Filled 2013-07-30 (×2): qty 1

## 2013-07-30 MED ORDER — PANTOPRAZOLE SODIUM 40 MG PO TBEC: 40.0000 mg | DELAYED_RELEASE_TABLET | Freq: Every day | ORAL | Status: DC

## 2013-07-30 MED ORDER — PANTOPRAZOLE SODIUM 40 MG PO TBEC
40.0000 mg | DELAYED_RELEASE_TABLET | Freq: Every day | ORAL | Status: DC
  Administered 2013-07-31: 40 mg via ORAL
  Filled 2013-07-30: qty 1

## 2013-07-30 MED ORDER — FERROUS FUMARATE 325 (106 FE) MG PO TABS: 1.0000 | ORAL_TABLET | ORAL | Status: DC

## 2013-07-30 MED ORDER — LISINOPRIL 5 MG PO TABS
5.0000 mg | ORAL_TABLET | Freq: Every day | ORAL | Status: DC
  Administered 2013-07-31: 5 mg via ORAL
  Filled 2013-07-30: qty 1

## 2013-07-30 MED ORDER — FUROSEMIDE 20 MG PO TABS
20.0000 mg | ORAL_TABLET | Freq: Every day | ORAL | Status: DC
  Administered 2013-07-31: 20 mg via ORAL
  Filled 2013-07-30: qty 1

## 2013-07-30 MED ORDER — METHOCARBAMOL 500 MG PO TABS
500.0000 mg | ORAL_TABLET | Freq: Four times a day (QID) | ORAL | Status: DC | PRN
Start: 2013-07-30 — End: 2013-07-31
  Administered 2013-07-30 – 2013-07-31 (×2): 500 mg via ORAL
  Filled 2013-07-30 (×2): qty 1

## 2013-07-30 MED ORDER — ASPIRIN 81 MG PO CHEW
81.0000 mg | CHEWABLE_TABLET | Freq: Every day | ORAL | Status: DC
  Administered 2013-07-31: 81 mg via ORAL
  Filled 2013-07-30: qty 1

## 2013-07-30 MED ORDER — ONDANSETRON HCL 4 MG PO TABS: 4.0000 mg | ORAL_TABLET | Freq: Four times a day (QID) | ORAL | Status: DC | PRN

## 2013-07-30 MED ORDER — FLUTICASONE PROPIONATE 50 MCG/ACT NA SUSP
2.0000 | Freq: Every day | NASAL | Status: DC
  Administered 2013-07-31: 2 via NASAL
  Filled 2013-07-30: qty 16

## 2013-07-30 MED ORDER — CALCIUM CARBONATE ANTACID 750 MG PO CHEW: 1.0000 | CHEWABLE_TABLET | Freq: Every day | ORAL | Status: DC

## 2013-07-30 MED ORDER — ACETAMINOPHEN 650 MG RE SUPP: 650.0000 mg | RECTAL | Status: DC | PRN

## 2013-07-30 MED ORDER — ADULT MULTIVITAMIN W/MINERALS CH
1.0000 | ORAL_TABLET | Freq: Every day | ORAL | Status: DC
  Administered 2013-07-31: 1 via ORAL
  Filled 2013-07-30: qty 1

## 2013-07-30 MED ORDER — METOPROLOL SUCCINATE ER 50 MG PO TB24
50.0000 mg | ORAL_TABLET | Freq: Every day | ORAL | Status: DC
  Administered 2013-07-30 – 2013-07-31 (×2): 50 mg via ORAL
  Filled 2013-07-30 (×2): qty 1

## 2013-07-30 MED ORDER — CYANOCOBALAMIN 250 MCG PO TABS
250.0000 ug | ORAL_TABLET | ORAL | Status: DC
  Administered 2013-07-31: 250 ug via ORAL
  Filled 2013-07-30: qty 1

## 2013-07-30 MED ORDER — GABAPENTIN 100 MG PO CAPS
100.0000 mg | ORAL_CAPSULE | Freq: Three times a day (TID) | ORAL | Status: DC
  Administered 2013-07-30 – 2013-07-31 (×3): 100 mg via ORAL
  Filled 2013-07-30 (×4): qty 1

## 2013-07-30 MED ORDER — CYANOCOBALAMIN 500 MCG PO TABS: 500.0000 ug | ORAL_TABLET | Freq: Every day | ORAL | Status: DC

## 2013-07-30 MED ORDER — LORATADINE 10 MG PO TABS
10.0000 mg | ORAL_TABLET | Freq: Every day | ORAL | Status: DC
  Administered 2013-07-31: 10 mg via ORAL
  Filled 2013-07-30: qty 1

## 2013-07-30 MED ORDER — ACETAMINOPHEN 325 MG PO TABS: 325.0000 mg | ORAL_TABLET | ORAL | Status: DC | PRN

## 2013-07-30 MED ORDER — METHOCARBAMOL 500 MG PO TABS
500.0000 mg | ORAL_TABLET | Freq: Four times a day (QID) | ORAL | Status: DC | PRN
Start: 2013-07-30 — End: 2013-07-30
  Administered 2013-07-30: 500 mg via ORAL
  Filled 2013-07-30: qty 1

## 2013-07-30 NOTE — Progress Notes (Signed)
UR complete.  Cortney Beissel RN, MSN 

## 2013-07-30 NOTE — Progress Notes (Signed)
As patient turned, hemovac tube was pulled out.  ABD x2 applied.  Previously had drained 100cc.  States less pain when valium is given with percocet.

## 2013-07-30 NOTE — Progress Notes (Signed)
Subjective: Patient reports "I think I'm doing better. They're taking really good care of me"  Objective: Vital signs in last 24 hours: Temp:  [97.3 F (36.3 C)-99 F (37.2 C)] 97.3 F (36.3 C) (03/20 0600) Pulse Rate:  [69-97] 69 (03/20 0600) Resp:  [12-21] 18 (03/20 0600) BP: (130-179)/(47-98) 136/60 mmHg (03/20 0600) SpO2:  [91 %-100 %] 99 % (03/20 0600) Weight:  [111.131 kg (245 lb)] 111.131 kg (245 lb) (03/20 0034)  Intake/Output from previous day: 03/19 0701 - 03/20 0700 In: 540 [P.O.:240; I.V.:300] Out: 2030 [Urine:1850; Drains:180] Intake/Output this shift: Total I/O In: 240 [P.O.:240] Out: -   Alert, smiling, hopeful for improvements with no further setbacks. Good strength BLE. Improved but still weak BUE. Pt notes increased fxn right hand, but fine motor remains problematic. ABD beneath collar over posterior cervical site - clean & dry.   Lab Results: No results found for this basename: WBC, HGB, HCT, PLT,  in the last 72 hours BMET No results found for this basename: NA, K, CL, CO2, GLUCOSE, BUN, CREATININE, CALCIUM,  in the last 72 hours  Studies/Results: Dg Cervical Spine 2-3 Views  07/28/2013   CLINICAL DATA:  C4-7 posterior cervical fusion.  EXAM: CERVICAL SPINE - 2-3 VIEW; DG C-ARM 1-60 MIN  COMPARISON:  DG CERVICAL SPINE 2-3 VIEWS dated 07/27/2013  FINDINGS: Three spot lateral fluoroscopic images of the cervical spine are submitted. Bone detail is limited. Based on the preexisting anterior plate and screws extending from C4 through C7, these views demonstrate the placement of pedicle screws at the same levels. The interconnecting rods have not yet been placed. No complications are identified.  IMPRESSION: Intraoperative views during C4-7 posterior fusion. Bone detail is limited.   Electronically Signed   By: Camie Patience M.D.   On: 07/28/2013 16:45   Dg C-arm 1-60 Min  07/28/2013   CLINICAL DATA:  C4-7 posterior cervical fusion.  EXAM: CERVICAL SPINE - 2-3 VIEW; DG  C-ARM 1-60 MIN  COMPARISON:  DG CERVICAL SPINE 2-3 VIEWS dated 07/27/2013  FINDINGS: Three spot lateral fluoroscopic images of the cervical spine are submitted. Bone detail is limited. Based on the preexisting anterior plate and screws extending from C4 through C7, these views demonstrate the placement of pedicle screws at the same levels. The interconnecting rods have not yet been placed. No complications are identified.  IMPRESSION: Intraoperative views during C4-7 posterior fusion. Bone detail is limited.   Electronically Signed   By: Camie Patience M.D.   On: 07/28/2013 16:45    Assessment/Plan: Improving   LOS: 2 days  Awits insurance approval to readmit CIR. Continue PT/OT.   Verdis Prime 07/30/2013, 8:42 AM

## 2013-07-30 NOTE — Progress Notes (Signed)
Patient in chair wanting to go back to bed.  Unable to stand and pivot or use the stedy lift.  Took 2 Rn's and 1 NT to get the patient in the bed using the lift.  Patient in bed resting.  Kizzie Bane, RN

## 2013-07-30 NOTE — H&P (Signed)
Physical Medicine and Rehabilitation Admission H&P    No chief complaint on file. :  Chief complaint: Bilateral shoulder soreness  HPI: [name] is a 79 y.o. right-handed female with history of hypertension, COPD. Admitted 07/16/2013 with progressive quadriparesis x3 days. Denied any bowel or bladder disturbances. Patient with low-grade fever 102.2 white blood cell count of 14,000. MRI and imaging revealed cervical epidural abscess. Underwent emergent anterior cervical decompression discectomy with fusion multilevel C4-C7 07/16/2013 per Dr. Vertell Limber. Decadron protocol is advised. Followup infectious disease with spinal abscess fluid growing and microaerophilic streptococci and maintained on ceftriaxone as well as vancomycin. Marland Kitchen Postoperative pain management. She remains on intravenous ceftriaxone through 08/26/2013. Physical and occupational therapy evaluations completed 07/17/2013 with recommendations for physical medicine rehabilitation consult. Patient was admitted inpatient rehabilitation services 07/22/2013 with ongoing therapies. Reports by therapy staff as well as patient on progressive weakness diminished triceps and hand intrinsic strength. MRI and imaging completed showing cord compression. Neurosurgery had been following patient and recommendations were made to return for surgical intervention and underwent cervical 4-7 posterior cervical fusion with lateral mass screws, posterior lateral arthrodesis C4-C7 levels and cervical laminectomy C3-T1 07/28/2013 per Dr. Vertell Limber. Patient was admitted for comprehensive rehabilitation program . Patient remained in acute care with therapies reevaluated. A hard cervical collar was placed to remain in place at all times. Postoperative pain management. Patient is readmitted to inpatient rehabilitation services for ongoing comprehensive rehabilitation.   ROS ROS Review of Systems  Respiratory: Positive for shortness of breath.  Gastrointestinal:  Positive for constipation.  Musculoskeletal: Positive for joint pain and myalgias.  Neurological: Positive for weakness. Insomnia  All other systems reviewed and are negative   Past Medical History  Diagnosis Date  . Hypertension   . Asthma   . Cancer     Skin Cancer  . Sleep apnea   . COPD (chronic obstructive pulmonary disease)    Past Surgical History  Procedure Laterality Date  . Gastric bypass    . Anterior cervical decomp/discectomy fusion N/A 07/16/2013    Procedure: ANTERIOR CERVICAL DECOMPRESSION/DISCECTOMY FUSION mutlipleLEVELS C4-7;  Surgeon: Erline Levine, MD;  Location: [facility];  Service: Neurosurgery;  Laterality: N/A;   History reviewed. No pertinent family history. Social History:  reports that she has never smoked. She does not have any smokeless tobacco history on file. She reports that she does not drink alcohol. Her drug history is not on file. Allergies:  Allergies  Allergen Reactions  . Codeine Nausea Only  . Iohexol Itching    07-15-13 pt developed itching on fingers after contrast given. Dr. Irish Elders looked at pt and said to put in system as allergy. BB  . Synvisc [Hylan G-F 20]    Medications Prior to Admission  Medication Sig Dispense Refill  . acetaminophen (TYLENOL) 650 MG CR tablet Take 1,300 mg by mouth every morning.       Marland Kitchen aspirin 81 MG tablet Take 81 mg by mouth every other day. Alternating with vitamin b-12      . calcium carbonate (TUMS EX) 750 MG chewable tablet Chew 1 tablet by mouth daily.      . Cholecalciferol (VITAMIN D) 2000 UNITS CAPS Take 2,000 Units by mouth daily.      . Ferrous Sulfate (SLOW RELEASE IRON PO) Take 1 tablet by mouth daily as needed (for low iron).      . fluticasone (FLONASE) 50 MCG/ACT nasal spray Place 1 spray into both nostrils 2 (two) times daily.       Marland Kitchen  Fluticasone-Salmeterol (ADVAIR) 250-50 MCG/DOSE AEPB Inhale 1 puff into the lungs 2 (two) times daily.      . furosemide (LASIX) 20 MG tablet Take 20 mg by mouth  daily as needed for fluid.       Marland Kitchen ibuprofen (ADVIL,MOTRIN) 200 MG tablet Take 400 mg by mouth at bedtime.       Marland Kitchen lisinopril (PRINIVIL,ZESTRIL) 5 MG tablet Take 5 mg by mouth daily.       Marland Kitchen loratadine (CLARITIN) 10 MG tablet Take 10 mg by mouth daily.      . metoprolol succinate (TOPROL-XL) 50 MG 24 hr tablet Take 50 mg by mouth daily. Take with or immediately following a meal.      . naproxen sodium (ANAPROX) 220 MG tablet Take 220 mg by mouth daily.       Marland Kitchen oxybutynin (DITROPAN) 5 MG tablet Take 2.5 mg by mouth 2 (two) times daily.       . Pediatric Multiple Vit-C-FA (PEDIATRIC MULTIVITAMIN) chewable tablet Chew 1 tablet by mouth daily.      . traZODone (DESYREL) 50 MG tablet Take 50 mg by mouth at bedtime.       . vitamin B-12 (CYANOCOBALAMIN) 250 MCG tablet Take 250 mcg by mouth every other day. Alternating with aspirin        Home: Home Living Family/patient expects to be discharged to:: Inpatient rehab Living Arrangements: Alone Available Help at Discharge: Family;Available 24 hours/day Type of Home: House Home Access: Stairs to enter [employer] of Steps: 3 Entrance Stairs-Rails: Left;Right Home Layout: One level Home Equipment: Shower seat;Tub bench;Walker - 2 wheels;Walker - 4 wheels;Transport chair;Bedside commode;Grab bars - tub/shower;Grab bars - toilet;Hand held shower head;Cane - single point (base for recliner) Additional Comments: baseline patient uses no DME but has DME from bil knee surg in the past - shower chair  Lives With: Alone (with Dog)   Functional History: Prior Function Comments: Read, sew, cook - likes baking cakes and perfecting chili  Functional Status:  Mobility:     Ambulation/Gait Ambulation Distance (Feet): 2 Feet Gait velocity: decreased General Gait Details: Pt very unsteady on feet with poor overall postural stability.  Pt had difficulty moving LEs needing cues and asssit to move them as she was unable to move them.  Pt appears to  have poor proprioception worse on right LE. Bil knees buckling needing max assist to assist pt to recover and stand.      ADL: ADL Eating/Feeding: Set up;Supervision/safety (rec small bites followed by liquids) Where Assessed - Eating/Feeding: Chair Grooming: Moderate assistance Where Assessed - Grooming: Supported sitting Upper Body Bathing: Moderate assistance Where Assessed - Upper Body Bathing: Supported sitting Lower Body Bathing: +1 Total assistance Where Assessed - Lower Body Bathing: Supported sit to stand Upper Body Dressing: Maximal assistance Where Assessed - Upper Body Dressing: Supported sitting Lower Body Dressing: +1 Total assistance Where Assessed - Lower Body Dressing: Supported sit to Tree surgeon Transfer: +2 Total assistance;Moderate assistance Toilet Transfer Method: Stand pivot Equipment Used: Gait belt Transfers/Ambulation Related to ADLs: +2 total A with mod A. Diffficulty maintinaing upright trunk. Able to stand step to chair. RLE increaed weakness. No buckoing of knees. ADL Comments: increaed independence as compared to prior to last surgery. Pt able to use standard call bell to call for nurse and change channel  Cognition: Cognition Overall Cognitive Status: Within Functional Limits for tasks assessed Orientation Level: Oriented X4 Cognition Arousal/Alertness: Awake/alert Behavior During Therapy: WFL for tasks assessed/performed Overall Cognitive Status:  Within Functional Limits for tasks assessed  Physical Exam: Blood pressure 136/60, pulse 69, temperature 97.3 F (36.3 C), temperature source Oral, resp. rate 18, height 5\' 3"  (1.6 m), weight 111.131 kg (245 lb), SpO2 99.00%. Physical Exam Constitutional:  79 year old obese female, no distress. alert HENT: oral mucosa pink and moist Head: Normocephalic.  Eyes: EOM are normal.  Neck: Normal range of motion. Neck supple. No thyromegaly present.  Cardiovascular: Normal rate and regular rhythm. No  murmurs, rubs, or gallops Respiratory: Effort normal and breath sounds normal. No respiratory distress.  GI: Soft. Bowel sounds are normal. She exhibits no distension.  Neurological:   patient is alert and a bit anxious. She is oriented x3 followed basic commands. RUE 2+ to 3- deltoid, bicep, triceps 2, WE 2, HI 2-, LUE 3 to 3+ deltoid, bicep, tricep, wrist and HI. LE: RHF, KE and ankle 1-2/5. LLE 2/5 proximal to distal. Decreased PP and LT/proprioception all 4's, lower more than upper, right more than left. DTR's 1+ Skin: Surgical site clean and dry without swelling.  No results found for this or any previous visit (from the past 48 hour(s)). Dg Cervical Spine 2-3 Views  07/28/2013   CLINICAL DATA:  C4-7 posterior cervical fusion.  EXAM: CERVICAL SPINE - 2-3 VIEW; DG C-ARM 1-60 MIN  COMPARISON:  DG CERVICAL SPINE 2-3 VIEWS dated 07/27/2013  FINDINGS: Three spot lateral fluoroscopic images of the cervical spine are submitted. Bone detail is limited. Based on the preexisting anterior plate and screws extending from C4 through C7, these views demonstrate the placement of pedicle screws at the same levels. The interconnecting rods have not yet been placed. No complications are identified.  IMPRESSION: Intraoperative views during C4-7 posterior fusion. Bone detail is limited.   Electronically Signed   By: Camie Patience M.D.   On: 07/28/2013 16:45   Dg C-arm 1-60 Min  07/28/2013   CLINICAL DATA:  C4-7 posterior cervical fusion.  EXAM: CERVICAL SPINE - 2-3 VIEW; DG C-ARM 1-60 MIN  COMPARISON:  DG CERVICAL SPINE 2-3 VIEWS dated 07/27/2013  FINDINGS: Three spot lateral fluoroscopic images of the cervical spine are submitted. Bone detail is limited. Based on the preexisting anterior plate and screws extending from C4 through C7, these views demonstrate the placement of pedicle screws at the same levels. The interconnecting rods have not yet been placed. No complications are identified.  IMPRESSION: Intraoperative  views during C4-7 posterior fusion. Bone detail is limited.   Electronically Signed   By: Camie Patience M.D.   On: 07/28/2013 16:45    Post Admission Physician Evaluation: 1. Functional deficits secondary  to cervical epidural abscess with incomplete tetraplegia s/p surgical re-exploration and subsequent C3-T1 lami and PLIF. 2. Patient is admitted to receive collaborative, interdisciplinary care between the physiatrist, rehab nursing staff, and therapy team. 3. Patient's level of medical complexity and substantial therapy needs in context of that medical necessity cannot be provided at a lesser intensity of care such as a SNF. 4. Patient has experienced substantial functional loss from his/her baseline which was documented above under the "Functional History" and "Functional Status" headings.  Judging by the patient's diagnosis, physical exam, and functional history, the patient has potential for functional progress which will result in measurable gains while on inpatient rehab.  These gains will be of substantial and practical use upon discharge  in facilitating mobility and self-care at the household level. 5. Physiatrist will provide 24 hour management of medical needs as well as oversight of the therapy plan/treatment  and provide guidance as appropriate regarding the interaction of the two. 6. 24 hour rehab nursing will assist with bladder management, bowel management, safety, skin/wound care, disease management, medication administration, pain management and patient education  and help integrate therapy concepts, techniques,education, etc. 7. PT will assess and treat for/with: Lower extremity strength, range of motion, stamina, balance, functional mobility, safety, adaptive techniques and equipment, NMR, sensation/proprioception, pain mgt, spine precautions, education.   Goals are: min to mod assist. 8. OT will assess and treat for/with: ADL's, functional mobility, safety, upper extremity strength,  adaptive techniques and equipment, NMR, proprioception/position sense, pain mgt, education.   Goals are: min to mod assist. 9. SLP will assess and treat for/with: n/a.  Goals are: n/a. 10. Case Management and Social Worker will assess and treat for psychological issues and discharge planning. 11. Team conference will be held weekly to assess progress toward goals and to determine barriers to discharge. 12. Patient will receive at least 3 hours of therapy per day at least 5 days per week. 13. ELOS: 20-28 days       14. Prognosis:  good   Medical Problem List and Plan: 1. Cervical epidural abscess with tetraplegia. Status post anterior cervical decompression discectomy multilevel C4-C7 07/16/2013 with subsequent cord compression undergoing cervical 4-7 posterior cervical fusion, C4-C7 cervical laminectomy and C3-T1 levels 07/28/2013 2. DVT Prophylaxis/Anticoagulation: SCDs. Monitor for any signs of DVT 3. Pain Management: Neurontin 100 mg 3 times a day, Hydrocodone and Robaxin as needed. Monitor with increased mobility 4.ID/microaerophilic streptococci. Intravenous ceftriaxone through 08/26/2013 5. Neuropsych: This patient is capable of making decisions on her own behalf. 6. Hypertension. Lisinopril 5 mg daily, Lasix 20 mg daily. Monitor with increased mobility 7. Asthma. Continue Dulera puffs twice daily as well as Flonase/CPAP 8. Urinary frequency. Continued itch or pain. Check PVRs x3 9. Obesity. History of gastric bypass 10. Insomnia. Trazodone 50 mg each bedtime.  Meredith Staggers, MD, [facility]  07/30/2013

## 2013-07-30 NOTE — Progress Notes (Signed)
Occupational Therapy Treatment Patient Details Name: [name] MRN: [id number] DOB: 03/16/1935 Today's Date: 07/30/2013 Time: 0923-3007 OT Time Calculation (min): 43 min  OT Assessment / Plan / Recommendation  History of present illness 79 yo female admitted with spinal epidural abscess undergoing ACDF.  Pt transferred to CIR, developed increased weakness. Underwent  cervical four-seven  posterior cervical fusion (N/A) with lateral mass screws, posterolateral arthrodesis C 4 - C 7 levels and cervical laminectomy C 3 - T 1 levels 3/18.    OT comments  Pt able to feed self with set up assistance with use of AE.  Foam built up handles provided to improve her ability to grasp standard utensils.    Follow Up Recommendations  CIR;Supervision/Assistance - 24 hour    Barriers to Discharge       Equipment Recommendations  Other (comment);None recommended by OT    Recommendations for Other Services Rehab consult  Frequency Min 3X/week   Progress towards OT Goals Progress towards OT goals: Progressing toward goals (goal added)  Plan Discharge plan remains appropriate    Precautions / Restrictions Precautions Precautions: Fall;Cervical Precaution Comments: parasthesias Required Braces or Orthoses: Cervical Brace Cervical Brace: Hard collar;At all times Restrictions Weight Bearing Restrictions: No Other Position/Activity Restrictions: no lifting  or heavy pulling/pushing   Pertinent Vitals/Pain     ADL  Eating/Feeding: Set up;Supervision/safety Where Assessed - Eating/Feeding: Bed level ADL Comments: Pt had just received lunch.  She attempted to use regular utensils, but unable to do so efficiently.  Foam handle provided to increase grip on fork,  Using that, she was able to stab and eat medium sized chunks of food items.  Unsure if she would be able to manage smaller sized items.   She required use of curved spoon for soup and able to feed self with minimal spills.  she requires  set up assistance to feed self finger foods.   Both elbows were propped on pillows prior to eating as she doesn't have the strength to fully support her UEs without the external support for the duration of the task.    OT Diagnosis:    OT Problem List:   OT Treatment Interventions:     OT Goals(current goals can now be found in the care plan section) ADL Goals Pt Will Perform Grooming: with modified independence;sitting Pt Will Perform Upper Body Bathing: with set-up;with supervision;sitting Pt Will Perform Lower Body Bathing: with min assist;sit to/from stand;with adaptive equipment Pt Will Transfer to Toilet: with min guard assist;bedside commode Pt Will Perform Toileting - Clothing Manipulation and hygiene: with supervision;sit to/from stand;with adaptive equipment Pt/caregiver will Perform Home Exercise Program: Increased strength;Increased ROM;Both right and left upper extremity;With theraputty;With Supervision;With written HEP provided  Visit Information  Assistance Needed: +2 PT/OT/SLP Co-Evaluation/Treatment: Yes History of Present Illness: 79 yo female admitted with spinal epidural abscess undergoing ACDF.  Pt transferred to CIR, developed increased weakness. Underwent  cervical four-seven  posterior cervical fusion (N/A) with lateral mass screws, posterolateral arthrodesis C 4 - C 7 levels and cervical laminectomy C 3 - T 1 levels 3/18.     Subjective Data      Prior Functioning       Cognition  Cognition Arousal/Alertness: Awake/alert Behavior During Therapy: WFL for tasks assessed/performed Overall Cognitive Status: Within Functional Limits for tasks assessed    Mobility       Exercises      Balance    End of Session OT - End of Session Equipment Utilized During  Treatment: Other (comment) (AE for feeding equipment) Activity Tolerance: Patient tolerated treatment well Patient left: in bed;with call bell/phone within reach;with family/visitor present  [place], Ellard Artis M 07/30/2013, 2:00 PM

## 2013-07-30 NOTE — Progress Notes (Signed)
OK to transfer to Rehab.

## 2013-07-30 NOTE — Progress Notes (Signed)
Physical Therapy Treatment Patient Details Name: [name] MRN: [id number] DOB: Oct 31, 1934 Today's Date: 07/30/2013 Time: 9371-6967 PT Time Calculation (min): 44 min  PT Assessment / Plan / Recommendation  History of Present Illness 79 yo female admitted with spinal epidural abscess undergoing ACDF.  Pt transferred to CIR, developed increased weakness. Underwent  cervical four-seven  posterior cervical fusion (N/A) with lateral mass screws, posterolateral arthrodesis C 4 - C 7 levels and cervical laminectomy C 3 - T 1 levels 3/18.    PT Comments   Patient willling and ready to progress with therapy. Patient able to stand x4 today with use of steady. Patient appears to have better trunk control this session compared to previous session. Continue to recommend comprehensive inpatient rehab (CIR) for post-acute therapy needs.   Follow Up Recommendations  CIR;Supervision/Assistance - 24 hour     Does the patient have the potential to tolerate intense rehabilitation     Barriers to Discharge        Equipment Recommendations       Recommendations for Other Services    Frequency Min 5X/week   Progress towards PT Goals Progress towards PT goals: Progressing toward goals  Plan Current plan remains appropriate    Precautions / Restrictions Precautions Precautions: Fall;Cervical Precaution Comments: parasthesias Required Braces or Orthoses: Cervical Brace Cervical Brace: Hard collar;At all times Restrictions Weight Bearing Restrictions: No Other Position/Activity Restrictions: no lifting  or heavy pulling/pushing   Pertinent Vitals/Pain Complained of neck pain and brace being uncomfortable    Mobility  Bed Mobility Rolling: +2 for physical assistance;Max assist Sidelying to sit: +2 for physical assistance;Max assist;HOB elevated General bed mobility comments: Pt needed cues for sequencing and assist to complete transitional movements due to pain and weakness.    Transfers Overall transfer level: Needs assistance Transfers: Sit to/from Stand Sit to Stand: Max assist;+2 physical assistance Stand pivot transfers: Max assist;+2 physical assistance General transfer comment: Patient appeared to have better trunk control this session, at times only required min assist for upright posture prior to standing. Did not attempt ambulation. Did have patient stand with use of steady x4 with max assist +2 with A for trunk support and powerup into standing. Cues for technique and positioning throughout .     Exercises     PT Diagnosis:    PT Problem List:   PT Treatment Interventions:     PT Goals (current goals can now be found in the care plan section)    Visit Information  Last PT Received On: 07/30/13 Assistance Needed: +2 History of Present Illness: 79 yo female admitted with spinal epidural abscess undergoing ACDF.  Pt transferred to CIR, developed increased weakness. Underwent  cervical four-seven  posterior cervical fusion (N/A) with lateral mass screws, posterolateral arthrodesis C 4 - C 7 levels and cervical laminectomy C 3 - T 1 levels 3/18.     Subjective Data      Cognition  Cognition Arousal/Alertness: Awake/alert Behavior During Therapy: WFL for tasks assessed/performed Overall Cognitive Status: Within Functional Limits for tasks assessed    Balance  Balance Sitting balance-Leahy Scale: Poor Standing balance-Leahy Scale: Zero  End of Session PT - End of Session Equipment Utilized During Treatment: Gait belt Activity Tolerance: Patient limited by fatigue Patient left: in chair;with call bell/phone within reach Nurse Communication: Mobility status;Need for lift equipment   GP     Jacqualyn Posey 07/30/2013, 2:37 PM  07/30/2013 Jacqualyn Posey PTA [phone] pager [phone] office

## 2013-07-30 NOTE — PMR Pre-admission (Signed)
PMR Admission Coordinator Pre-Admission Assessment  Patient: [name] is an 79 y.o., female MRN: [id number] DOB: 1935/01/07 Height: _0  (160 cm) Weight: 111.131 kg (245 lb)              Insurance Information HMO:      PPO: yes     PCP:       IPA:       80/20:       OTHER: Group #[id number] PRIMARY: [employer] medicare      Policy#: [id number]      Subscriber: [name] CM Name: [name]      Phone#: [phone]     Fax#:   Pre-Cert#: [id number] with updates per on site reviewer     Employer: Retired Benefits:  Phone #: [phone]     Name: [name]. Date: 05/13/13     Deduct: $0      Out of Pocket Max: $2600(met $120.81      Life Max: unlimited CIR: $150 days 1-8      SNF: $0 days 1-20, $50 days 21-100 Outpatient: w/medical necessity     Co-Pay: $10/therapy visit Home Health: 100%      Co-Pay: none DME: 80%     Co-Pay: 20% Providers: in network   Emergency Contact Information Contact Information   Name Relation Home Work Mobile   Poynette Son   [phone]   Akiko, Schexnider [phone]  [phone]     Current Medical History  Patient Admitting Diagnosis:  Cervical epidural abscess with central cord syndrome s/p decompression/fusion  History of Present Illness:A 79 y.o. right-handed female with history of hypertension, COPD. Admitted 07/16/2013 with progressive quadriparesis x3 days. Denied any bowel or bladder disturbances. Patient with low-grade fever 102.2 white blood cell count of 14,000. MRI and imaging revealed cervical epidural abscess. Underwent emergent anterior cervical decompression discectomy with fusion multilevel C4-C7 07/16/2013 per Dr. Vertell Limber. Decadron protocol is advised. Followup infectious disease with spinal abscess fluid growing and microaerophilic streptococci and maintained on ceftriaxone as well as vancomycin. Marland Kitchen Postoperative pain management. She remains on intravenous ceftriaxone through 08/26/2013. Physical and occupational  therapy evaluations completed 07/17/2013 with recommendations for physical medicine rehabilitation consult. Patient was admitted inpatient rehabilitation services 07/22/2013 with ongoing therapies. Reports by therapy staff as well as patient on progressive weakness, diminished triceps and hand intrinsic strength. MRI and imaging completed showing cord compression. Neurosurgery had been following patient and recommendations were made to return for surgical intervention and underwent cervical 4-7 posterior cervical fusion with lateral mass screws, posterior lateral arthrodesis C4-C7 levels and cervical laminectomy C3-T1 07/28/2013 per Dr. Vertell Limber.  Patient remained in acute care with therapies reevaluated. A hard cervical collar was placed to remain in place at all times. Postoperative pain management. Patient to be readmitted to inpatient rehabilitation services for ongoing comprehensive rehabilitation.     Past Medical History  Past Medical History  Diagnosis Date  . Hypertension   . Asthma   . Cancer     Skin Cancer  . Sleep apnea   . COPD (chronic obstructive pulmonary disease)     Family History  family history is not on file.  Prior Rehab/Hospitalizations:  Had TKR and went to [facility].  Came to CIR 07/22/13 to 07/28/13, then back to acute.   Current Medications  Current facility-administered medications:0.9 %  sodium chloride infusion, 250 mL, Intravenous, Continuous, Erline Levine, MD;  acetaminophen (TYLENOL) suppository 650 mg, 650 mg, Rectal, Q4H PRN, Erline Levine, MD;  acetaminophen (TYLENOL) tablet  650 mg, 650 mg, Oral, Q4H PRN, Erline Levine, MD;  alum & mag hydroxide-simeth (MAALOX/MYLANTA) 200-200-20 MG/5ML suspension 30 mL, 30 mL, Oral, Q6H PRN, Erline Levine, MD aspirin chewable tablet 81 mg, 81 mg, Oral, Daily, Erline Levine, MD, 81 mg at 07/30/13 1011;  bisacodyl (DULCOLAX) suppository 10 mg, 10 mg, Rectal, Daily PRN, Erline Levine, MD, 10 mg at 07/30/13 1014;  calcium  carbonate (OS-CAL - dosed in mg of elemental calcium) tablet 1,000 mg of elemental calcium, 2 tablet, Oral, Daily, Erline Levine, MD, 1,000 mg of elemental calcium at 07/30/13 1011 cholecalciferol (VITAMIN D) tablet 2,000 Units, 2,000 Units, Oral, Daily, Erline Levine, MD, 2,000 Units at 07/30/13 1014;  clotrimazole (LOTRIMIN) 1 % cream, , Topical, BID, Erline Levine, MD;  cyanocobalamin tablet 500 mcg, 500 mcg, Oral, Daily, Erline Levine, MD, 500 mcg at 07/30/13 1010;  dextrose 5 % and 0.45 % NaCl with KCl 20 mEq/L infusion, , Intravenous, Continuous, Erline Levine, MD diazepam (VALIUM) tablet 5 mg, 5 mg, Oral, Q6H PRN, Erline Levine, MD, 5 mg at 07/30/13 1014;  docusate sodium (COLACE) capsule 100 mg, 100 mg, Oral, BID, Erline Levine, MD, 100 mg at 07/30/13 1014;  ferrous fumarate (HEMOCYTE - 106 mg FE) tablet 106 mg of iron, 1 tablet, Oral, Once per day on Mon Wed Fri, Erline Levine, MD;  fluticasone [facility]) 50 MCG/ACT nasal spray 2 spray, 2 spray, Each Nare, Daily, Erline Levine, MD, 2 spray at 07/30/13 1010 furosemide (LASIX) tablet 20 mg, 20 mg, Oral, Daily, Erline Levine, MD, 20 mg at 07/30/13 1011;  gabapentin (NEURONTIN) capsule 100 mg, 100 mg, Oral, TID, Erline Levine, MD;  HYDROcodone-acetaminophen (NORCO/VICODIN) 5-325 MG per tablet 1-2 tablet, 1-2 tablet, Oral, Q4H PRN, Erline Levine, MD, 2 tablet at 07/29/13 1018;  lisinopril (PRINIVIL,ZESTRIL) tablet 5 mg, 5 mg, Oral, Daily, Erline Levine, MD, 5 mg at 07/30/13 1011 loratadine (CLARITIN) tablet 10 mg, 10 mg, Oral, Daily, Erline Levine, MD, 10 mg at 07/30/13 1010;  menthol-cetylpyridinium (CEPACOL) lozenge 3 mg, 1 lozenge, Oral, PRN, Erline Levine, MD;  methocarbamol (ROBAXIN) tablet 500 mg, 500 mg, Oral, Q6H PRN, Erline Levine, MD, 500 mg at 07/30/13 1448;  mometasone-formoterol (DULERA) 100-5 MCG/ACT inhaler 2 puff, 2 puff, Inhalation, BID, Erline Levine, MD, 2 puff at 07/30/13 1006 morphine 2 MG/ML injection 1-4 mg, 1-4 mg, Intravenous, Q3H PRN, Erline Levine,  MD, 2 mg at 07/30/13 1507;  multivitamin with minerals tablet 1 tablet, 1 tablet, Oral, Daily, Erline Levine, MD, 1 tablet at 07/30/13 1010;  ondansetron [facility]) injection 4 mg, 4 mg, Intravenous, Q4H PRN, Erline Levine, MD;  oxybutynin [facility]) tablet 2.5 mg, 2.5 mg, Oral, BID, Erline Levine, MD, 2.5 mg at 07/30/13 1011 oxyCODONE-acetaminophen (PERCOCET/ROXICET) 5-325 MG per tablet 1-2 tablet, 1-2 tablet, Oral, Q4H PRN, Erline Levine, MD, 2 tablet at 07/30/13 1249;  pantoprazole (PROTONIX) EC tablet 40 mg, 40 mg, Oral, Q1200, Erline Levine, MD;  phenol (CHLORASEPTIC) mouth spray 1 spray, 1 spray, Mouth/Throat, PRN, Erline Levine, MD;  pneumococcal 13-valent conjugate vaccine (PREVNAR 13) injection 0.5 mL, 0.5 mL, Intramuscular, Once, Erline Levine, MD senna [facility]) tablet 8.6 mg, 1 tablet, Oral, BID, Erline Levine, MD, 8.6 mg at 07/30/13 1010;  sodium chloride 0.9 % injection 3 mL, 3 mL, Intravenous, Q12H, Erline Levine, MD, 3 mL at 07/30/13 1509;  sodium chloride 0.9 % injection 3 mL, 3 mL, Intravenous, PRN, Erline Levine, MD;  traZODone (DESYREL) tablet 50 mg, 50 mg, Oral, QHS, Erline Levine, MD, 50 mg at 07/29/13 2149  Patients Current Diet: General  Precautions / Restrictions Precautions Precautions: Fall;Cervical Precaution Comments: parasthesias Cervical Brace: Hard collar;At all times Restrictions Weight Bearing Restrictions: No Other Position/Activity Restrictions: no lifting  or heavy pulling/pushing   Prior Activity Level Community (5-7x/wk): Went out daily  Development worker, international aid / [place] Devices/Equipment: Environmental consultant (specify type) Home Equipment: Shower seat;Tub bench;Walker - 2 wheels;Walker - 4 wheels;Transport chair;Bedside commode;Grab bars - tub/shower;Grab bars - toilet;Hand held shower head;Cane - single point (base for recliner)  Prior Functional Level Prior Function Level of Independence: Independent Comments: Read, sew, cook - likes baking cakes and perfecting  chili  Current Functional Level Cognition  Overall Cognitive Status: Within Functional Limits for tasks assessed Orientation Level: Oriented X4    Extremity Assessment (includes Sensation/Coordination)          ADLs  Eating/Feeding: Set up;Supervision/safety Where Assessed - Eating/Feeding: Bed level  ADL Comments: Pt had just received lunch. She attempted to use regular utensils, but unable to do so efficiently. Foam handle provided to increase grip on fork, Using that, she was able to stab and eat medium sized chunks of food items. Unsure if she would be able to manage smaller sized items. She required use of curved spoon for soup and able to feed self with minimal spills. she requires set up assistance to feed self finger foods. Both elbows were propped on pillows prior to eating as she doesn't have the strength to fully support her UEs without the external support for the duration of the task.  Grooming: Moderate assistance  Where Assessed - Grooming: Supported sitting  Upper Body Bathing: Moderate assistance  Where Assessed - Upper Body Bathing: Supported sitting  Lower Body Bathing: +1 Total assistance  Where Assessed - Lower Body Bathing: Supported sit to stand  Upper Body Dressing: Maximal assistance  Where Assessed - Upper Body Dressing: Supported sitting  Lower Body Dressing: +1 Total assistance  Where Assessed - Lower Body Dressing: Supported sit to Arts development officer Transfer: +2 Total assistance;Moderate assistance  Toilet Transfer Method: Stand pivot  Toileting - Clothing Manipulation and Hygiene: +1 Total assistance (foley)  Equipment Used: Gait belt  Transfers/Ambulation Related to ADLs: +2 total A with mod A. Diffficulty maintinaing upright trunk. Able to stand step to chair. RLE increaed weakness. No buckoing of knees.  ADL Comments: increaed independence as compared to prior to last surgery. Pt able to use standard call bell to call for nurse and change channel      Mobility  Overal bed mobility: +2 for physical assistance;Needs Assistance Bed Mobility: Sit to Supine Rolling: +2 for physical assistance;Max assist Sidelying to sit: +2 for physical assistance;Max assist;HOB elevated Sit to supine: Max assist;+2 for physical assistance Sit to sidelying: Max assist;+2 for physical assistance General bed mobility comments: Pt needed cues for sequencing and assist to complete transitional movements due to pain and weakness.      Transfers  Overall transfer level: Needs assistance Equipment used: Rolling walker (2 wheeled) Transfers: Sit to/from Stand Sit to Stand: Max assist;+2 physical assistance Stand pivot transfers: Max assist;+2 physical assistance General transfer comment: Patient appeared to have better trunk control this session, at times only required min assist for upright posture prior to standing. Did not attempt ambulation. Did have patient stand with use of steady x4 with max assist +2 with A for trunk support and powerup into standing. Cues for technique and positioning throughout .     Ambulation / Gait / Stairs / Wheelchair Mobility  Ambulation/Gait Ambulation/Gait assistance: Max assist;+2 physical  assistance Ambulation Distance (Feet): 2 Feet Assistive device: Rolling walker (2 wheeled) Gait Pattern/deviations: Step-to pattern;Decreased dorsiflexion - right;Steppage;Shuffle;Ataxic;Antalgic;Trunk flexed;Narrow base of support Gait velocity: decreased Gait velocity interpretation: Below normal speed for age/gender General Gait Details: Pt very unsteady on feet with poor overall postural stability.  Pt had difficulty moving LEs needing cues and asssit to move them as she was unable to move them.  Pt appears to have poor proprioception worse on right LE. Bil knees buckling needing max assist to assist pt to recover and stand.      Posture / Balance Dynamic Sitting Balance Sitting balance - Comments: Leaning anteriorly significantly needing  cues with pt unable to right correctly.      Special needs/care consideration BiPAP/CPAP Yes, using CPAP at home and in hospital CPM No Continuous Drip IV No Dialysis No       Life Ves No Oxygen Yes, 2L [place] Special Bed No Trach Size No Wound Vac (area) No      Skin Cervical surgical incisions.  Cervical collar.                             Bowel mgmt: Problems with constipation.  Needs bowel program. Bladder mgmt: Voiding with incontinence Diabetic mgmt No    Previous Home Environment Living Arrangements: Alone  Lives With: Alone (with Dog) Available Help at Discharge: Family;Available 24 hours/day Type of Home: House Home Layout: One level Home Access: Stairs to enter Entrance Stairs-Rails: Chemical engineer of Steps: 3 Home Care Services: No Additional Comments: baseline patient uses no DME but has DME from bil knee surg in the past - shower chair  Discharge Living Setting Plans for Discharge Living Setting: Patient's home;Alone;House Type of Home at Discharge: House Discharge Home Layout: One level Discharge Home Access: Stairs to enter Entrance Stairs-Number of Steps: 3-4 step entry Does the patient have any problems obtaining your medications?: No  Social/Family/Support Systems Patient Roles: Parent Contact Information: Lauralie Blacksher - son, works as Secretary/administrator here at [employer]. Anticipated Caregiver: Sister can check in on patient.  May hire [facility] to assist after rehab Anticipated Caregiver's Contact Information: Yvone Neu - son [phone] Ability/Limitations of Caregiver: Currently does not have 24 hr supervision in place.  Anticipate possible need for SNF after inpatient rehab stay. Caregiver Availability: Other (Comment) (Working on a plan.) Discharge Plan Discussed with Primary Caregiver: Yes Is Caregiver In Agreement with Plan?: Yes Does Caregiver/Family have Issues with Lodging/Transportation while Pt is in Rehab?: No  Goals/Additional  Needs Patient/Family Goal for Rehab: PT/OT S/min assist goals Expected length of stay: 15-23 days Cultural Considerations: None Dietary Needs: Regular, thin liquids Equipment Needs: TBD Pt/Family Agrees to Admission and willing to participate: Yes Program Orientation Provided & Reviewed with Pt/Caregiver Including Roles  & Responsibilities: Yes  Decrease burden of Care through IP rehab admission: N/A   Possible need for SNF placement upon discharge: Yes, likely will need SNF after inpatient rehab stay due to increased deficits and limited help at home.  Patient Condition: This patient's medical and functional status has changed since the consult dated: 07/19/13 in which the Rehabilitation Physician determined and documented that the patient's condition is appropriate for intensive rehabilitative care in an inpatient rehabilitation facility. See "History of Present Illness" (above) for medical update. Functional changes are: Currently requiring max assist +2 for stand pivot transfers. Patient's medical and functional status update has been discussed with the Rehabilitation physician and patient remains appropriate for  inpatient rehabilitation. Will admit to inpatient rehab tomorrow, Saturday.  Preadmission Screen Completed By:  Retta Diones, 07/30/2013 3:58 PM ______________________________________________________________________   Discussed status with Dr. Naaman Plummer on 07/30/13 at 1557 and received telephone approval for admission tomorrow, Saturday.  Admission Coordinator:  Retta Diones, time1557/Date03/20/15

## 2013-07-30 NOTE — Progress Notes (Signed)
Occupational Therapy Treatment Patient Details Name: [name] MRN: [id number] DOB: [birth year] Today's Date: 07/30/2013 Time: 9485-4627 OT Time Calculation (min): 43 min  OT Assessment / Plan / Recommendation  History of present illness 79 yo female admitted with spinal epidural abscess undergoing ACDF.  Pt transferred to CIR, developed increased weakness. Underwent  cervical four-seven  posterior cervical fusion (N/A) with lateral mass screws, posterolateral arthrodesis C 4 - C 7 levels and cervical laminectomy C 3 - T 1 levels 3/18.    OT comments  Worked on EOB sitting and trunk control.  Pt requires +1 total A for all aspects of bed mobility - only able to assist ~20%.  She sat EOB while working on trunk control - requires mod A with light support from bil. UEs - if allowed to fully prop self with Lt UE, she is able to maintain sitting with min guard assist.  Pt fatigues quickly with isolated trunk strengthening activities while EOB.  Pt will benefit from CIR.   Follow Up Recommendations  CIR;Supervision/Assistance - 24 hour    Barriers to Discharge       Equipment Recommendations  None recommended by OT    Recommendations for Other Services Rehab consult  Frequency Min 3X/week   Progress towards OT Goals Progress towards OT goals: Progressing toward goals  Plan Discharge plan remains appropriate    Precautions / Restrictions Precautions Precautions: Fall;Cervical Precaution Comments: parasthesias Required Braces or Orthoses: Cervical Brace Cervical Brace: Hard collar;At all times Restrictions Weight Bearing Restrictions: No Other Position/Activity Restrictions: no lifting  or heavy pulling/pushing   Pertinent Vitals/Pain     ADL  ADL Comments: Pt moved to EOB sitting with total A.  Worked on trunk control EOB.  Able to sit with Lt. UE support and min guard assist, but required mod A without fully supporting self with Lt. UE.  With UEs on therapist's forearms, worked  on facilitation of anterior/neutral pelvis and moving into thoracic extension - requires mod faciliation, and unable to sustain this position.   Pt loses balance to Lt requiring mod A to correct.  Pt returned to supine due to fatigue    OT Diagnosis:    OT Problem List:   OT Treatment Interventions:     OT Goals(current goals can now be found in the care plan section) Acute Rehab OT Goals Time For Goal Achievement: 08/12/13 Potential to Achieve Goals: Good ADL Goals Pt Will Perform Grooming: with modified independence;sitting Pt Will Perform Upper Body Bathing: with set-up;with supervision;sitting Pt Will Perform Lower Body Bathing: with min assist;sit to/from stand;with adaptive equipment Pt Will Transfer to Toilet: with min guard assist;bedside commode Pt Will Perform Toileting - Clothing Manipulation and hygiene: with supervision;sit to/from stand;with adaptive equipment Pt/caregiver will Perform Home Exercise Program: Increased strength;Increased ROM;Both right and left upper extremity;With theraputty;With Supervision;With written HEP provided Additional ADL Goal #1: Pt will complete bed mobility Min (A) without bed rails and hob ~20 degrees or less Additional ADL Goal #2: Pt will return demonstrates BIL UE HEP Supervision level with theraputty  Visit Information  Last OT Received On: 07/30/13 Assistance Needed: +2 History of Present Illness: 79 yo female admitted with spinal epidural abscess undergoing ACDF.  Pt transferred to CIR, developed increased weakness. Underwent  cervical four-seven  posterior cervical fusion (N/A) with lateral mass screws, posterolateral arthrodesis C 4 - C 7 levels and cervical laminectomy C 3 - T 1 levels 3/18.     Subjective Data  Prior Functioning       Cognition  Cognition Arousal/Alertness: Awake/alert Behavior During Therapy: WFL for tasks assessed/performed Overall Cognitive Status: Within Functional Limits for tasks assessed     Mobility  Bed Mobility Overal bed mobility: Needs Assistance Bed Mobility: Sidelying to Sit;Sit to Sidelying;Rolling Rolling: Max assist Sidelying to sit: Total assist (Pt asssited ~20%) Sit to sidelying: Total assist General bed mobility comments: Pt able to assist with ~20% of activity.  Once she was able to get Lt. UE in position, she was able to prop and push with Lt. UE Transfers Overall transfer level: Needs assistance Transfers: Sit to/from Stand Sit to Stand: Max assist;+2 physical assistance Stand pivot transfers: Max assist;+2 physical assistance General transfer comment: Patient appeared to have better trunk control this session, at times only required min assist for upright posture prior to standing. Did not attempt ambulation. Did have patient stand with use of steady x4 with max assist +2 with A for trunk support and powerup into standing. Cues for technique and positioning throughout .     Exercises  Other Exercises Other Exercises: Pt performed AAROM shoulder flex/ext x 5   Balance Balance Overall balance assessment: Needs assistance Sitting-balance support: Bilateral upper extremity supported Sitting balance-Leahy Scale: Poor Sitting balance - Comments: leans to Lt and at times anteriorly - see comments under ADL section Postural control: Left lateral lean Standing balance-Leahy Scale: Zero  End of Session OT - End of Session Activity Tolerance: Patient tolerated treatment well Patient left: in bed;with call bell/phone within reach;with family/visitor present Nurse Communication: Mobility status  GO     Livier Hendel, Ellard Artis M 07/30/2013, 5:46 PM

## 2013-07-30 NOTE — Progress Notes (Signed)
Rehab admissions - I have approval for acute inpatient rehab admission.  Will admit tomorrow, Saturday, pending continued medical stability.  Call me for questions.  #[phone]

## 2013-07-30 NOTE — Discharge Summary (Signed)
Physician Discharge Summary  Patient ID: [name] MRN: [id number] DOB/AGE: 05/30/1934 79 y.o.  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  Posterior cervical decompression and fusion  was performed by DrStern on 07/28/13.  She recovered nicely and transferred to [facility] for nursing care and resumption of PT/OT. She continues to improve.   Consults: ID  Significant Diagnostic Studies: radiology: X-Ray: intra-operative  Treatments: surgery: Cervical four-seven  posterior cervical fusion (N/A) with lateral mass screws, posterolateral arthrodesis C 4 - C 7 levels and cervical laminectomy C 3 - T 1 levels    Discharge Exam: Blood pressure 138/62, pulse 77, temperature 98.2 F (36.8 C), temperature source Oral, resp. rate 18, height 5\' 3"  (1.6 m), weight 111.131 kg (245 lb), SpO2 98.00%. She demonstrates improved UE strength L>R, with significant weakness remaining. Vista Collar in use.   Disposition: Discharge to [facility] on Saturday (tomorrow)   Future Appointments Provider Department Dept Phone   08/01/2013 9:00 AM [address] [employer], [place] [facility] [phone]   08/01/2013 11:00 AM [place] [facility] [phone]   08/10/2013 3:15 PM Truman Hayward, MD [facility] [phone]       Medication List    ASK your doctor about these medications       acetaminophen 650 MG CR tablet  Commonly known as:  TYLENOL  Take 1,300 mg by mouth every morning.     aspirin 81 MG tablet  Take 81 mg by mouth every other day. Alternating with vitamin b-12     calcium carbonate 750 MG chewable tablet  Commonly known as:  TUMS EX  Chew 1 tablet by mouth daily.     fluticasone 50 MCG/ACT nasal spray  Commonly known as:  FLONASE  Place 1 spray into both nostrils 2 (two) times daily.     Fluticasone-Salmeterol 250-50 MCG/DOSE Aepb  Commonly known as:  ADVAIR  Inhale 1 puff into the lungs 2 (two) times daily.     furosemide 20 MG tablet  Commonly known as:  LASIX  Take 20 mg by mouth daily as needed for fluid.     ibuprofen 200 MG tablet  Commonly known as:  ADVIL,MOTRIN  Take 400 mg by mouth at bedtime.     lisinopril 5 MG tablet  Commonly known as:  PRINIVIL,ZESTRIL  Take 5 mg by mouth daily.     loratadine 10 MG tablet  Commonly known as:  CLARITIN  Take 10 mg by mouth daily.     metoprolol succinate 50 MG 24 hr tablet  Commonly known as:  TOPROL-XL  Take 50 mg by mouth daily. Take with or immediately following a meal.     naproxen sodium 220 MG tablet  Commonly known as:  ANAPROX  Take 220 mg by mouth daily.     oxybutynin 5 MG tablet  Commonly known  as:  DITROPAN  Take 2.5 mg by mouth 2 (two) times daily.     pediatric multivitamin chewable tablet  Chew 1 tablet by mouth daily.     SLOW RELEASE IRON PO  Take 1 tablet by mouth daily as needed (for low iron).     traZODone 50 MG tablet  Commonly known as:  DESYREL  Take 50 mg by mouth at bedtime.     vitamin B-12 250 MCG tablet  Commonly known as:  CYANOCOBALAMIN  Take 250 mcg by mouth every other day. Alternating with aspirin     Vitamin D 2000 UNITS Caps  Take 2,000 Units by mouth daily.         Signed: Verdis Prime 07/30/2013, 2:43 PM

## 2013-07-31 ENCOUNTER — Encounter (HOSPITAL_COMMUNITY): Payer: Self-pay | Admitting: *Deleted

## 2013-07-31 ENCOUNTER — Inpatient Hospital Stay (HOSPITAL_COMMUNITY)
Admission: RE | Admit: 2013-07-31 | Discharge: 2013-08-26 | DRG: 945 | Disposition: A | Discharge status: Skilled Nursing Facility | Payer: PRIVATE HEALTH INSURANCE | Source: Intra-hospital | Attending: Physical Medicine & Rehabilitation | Admitting: Physical Medicine & Rehabilitation

## 2013-07-31 DIAGNOSIS — R350 Frequency of micturition: Secondary | ICD-10-CM | POA: Diagnosis present

## 2013-07-31 DIAGNOSIS — R5381 Other malaise: Secondary | ICD-10-CM | POA: Diagnosis present

## 2013-07-31 DIAGNOSIS — J449 Chronic obstructive pulmonary disease, unspecified: Secondary | ICD-10-CM | POA: Diagnosis present

## 2013-07-31 DIAGNOSIS — I10 Essential (primary) hypertension: Secondary | ICD-10-CM | POA: Diagnosis present

## 2013-07-31 DIAGNOSIS — F410 Panic disorder [episodic paroxysmal anxiety] without agoraphobia: Secondary | ICD-10-CM | POA: Diagnosis not present

## 2013-07-31 DIAGNOSIS — J3801 Paralysis of vocal cords and larynx, unilateral: Secondary | ICD-10-CM | POA: Diagnosis not present

## 2013-07-31 DIAGNOSIS — B370 Candidal stomatitis: Secondary | ICD-10-CM | POA: Diagnosis present

## 2013-07-31 DIAGNOSIS — G8254 Quadriplegia, C5-C7 incomplete: Secondary | ICD-10-CM | POA: Diagnosis present

## 2013-07-31 DIAGNOSIS — G4733 Obstructive sleep apnea (adult) (pediatric): Secondary | ICD-10-CM

## 2013-07-31 DIAGNOSIS — Z7982 Long term (current) use of aspirin: Secondary | ICD-10-CM | POA: Diagnosis not present

## 2013-07-31 DIAGNOSIS — L299 Pruritus, unspecified: Secondary | ICD-10-CM | POA: Diagnosis present

## 2013-07-31 DIAGNOSIS — E669 Obesity, unspecified: Secondary | ICD-10-CM | POA: Diagnosis present

## 2013-07-31 DIAGNOSIS — M2550 Pain in unspecified joint: Secondary | ICD-10-CM | POA: Diagnosis present

## 2013-07-31 DIAGNOSIS — R030 Elevated blood-pressure reading, without diagnosis of hypertension: Secondary | ICD-10-CM

## 2013-07-31 DIAGNOSIS — Z5189 Encounter for other specified aftercare: Secondary | ICD-10-CM | POA: Diagnosis present

## 2013-07-31 DIAGNOSIS — R4189 Other symptoms and signs involving cognitive functions and awareness: Secondary | ICD-10-CM | POA: Diagnosis present

## 2013-07-31 DIAGNOSIS — R490 Dysphonia: Secondary | ICD-10-CM | POA: Diagnosis not present

## 2013-07-31 DIAGNOSIS — F40298 Other specified phobia: Secondary | ICD-10-CM | POA: Diagnosis present

## 2013-07-31 DIAGNOSIS — F411 Generalized anxiety disorder: Secondary | ICD-10-CM | POA: Diagnosis present

## 2013-07-31 DIAGNOSIS — Z9884 Bariatric surgery status: Secondary | ICD-10-CM

## 2013-07-31 DIAGNOSIS — R6889 Other general symptoms and signs: Secondary | ICD-10-CM | POA: Diagnosis not present

## 2013-07-31 DIAGNOSIS — G4730 Sleep apnea, unspecified: Secondary | ICD-10-CM | POA: Diagnosis present

## 2013-07-31 DIAGNOSIS — IMO0001 Reserved for inherently not codable concepts without codable children: Secondary | ICD-10-CM

## 2013-07-31 DIAGNOSIS — G8252 Quadriplegia, C1-C4 incomplete: Secondary | ICD-10-CM | POA: Diagnosis present

## 2013-07-31 DIAGNOSIS — R339 Retention of urine, unspecified: Secondary | ICD-10-CM | POA: Diagnosis present

## 2013-07-31 DIAGNOSIS — N319 Neuromuscular dysfunction of bladder, unspecified: Secondary | ICD-10-CM | POA: Diagnosis present

## 2013-07-31 DIAGNOSIS — Z1639 Resistance to other specified antimicrobial drug: Secondary | ICD-10-CM | POA: Diagnosis present

## 2013-07-31 DIAGNOSIS — R269 Unspecified abnormalities of gait and mobility: Secondary | ICD-10-CM | POA: Diagnosis present

## 2013-07-31 DIAGNOSIS — Z79899 Other long term (current) drug therapy: Secondary | ICD-10-CM

## 2013-07-31 DIAGNOSIS — K5900 Constipation, unspecified: Secondary | ICD-10-CM

## 2013-07-31 DIAGNOSIS — N390 Urinary tract infection, site not specified: Secondary | ICD-10-CM | POA: Diagnosis present

## 2013-07-31 DIAGNOSIS — R5383 Other fatigue: Secondary | ICD-10-CM

## 2013-07-31 DIAGNOSIS — K592 Neurogenic bowel, not elsewhere classified: Secondary | ICD-10-CM | POA: Diagnosis present

## 2013-07-31 DIAGNOSIS — G959 Disease of spinal cord, unspecified: Secondary | ICD-10-CM | POA: Diagnosis present

## 2013-07-31 DIAGNOSIS — G4700 Insomnia, unspecified: Secondary | ICD-10-CM | POA: Diagnosis present

## 2013-07-31 DIAGNOSIS — G061 Intraspinal abscess and granuloma: Secondary | ICD-10-CM

## 2013-07-31 DIAGNOSIS — B952 Enterococcus as the cause of diseases classified elsewhere: Secondary | ICD-10-CM | POA: Diagnosis present

## 2013-07-31 DIAGNOSIS — J4489 Other specified chronic obstructive pulmonary disease: Secondary | ICD-10-CM | POA: Diagnosis present

## 2013-07-31 DIAGNOSIS — A491 Streptococcal infection, unspecified site: Secondary | ICD-10-CM

## 2013-07-31 DIAGNOSIS — Z85828 Personal history of other malignant neoplasm of skin: Secondary | ICD-10-CM | POA: Diagnosis not present

## 2013-07-31 DIAGNOSIS — Z9989 Dependence on other enabling machines and devices: Secondary | ICD-10-CM

## 2013-07-31 DIAGNOSIS — G8250 Quadriplegia, unspecified: Secondary | ICD-10-CM

## 2013-07-31 LAB — URINALYSIS, ROUTINE W REFLEX MICROSCOPIC
BILIRUBIN URINE: NEGATIVE
Glucose, UA: NEGATIVE mg/dL
Ketones, ur: NEGATIVE mg/dL
Nitrite: NEGATIVE
PROTEIN: NEGATIVE mg/dL
Specific Gravity, Urine: 1.024 (ref 1.005–1.030)
Urobilinogen, UA: 0.2 mg/dL (ref 0.0–1.0)
pH: 5 (ref 5.0–8.0)

## 2013-07-31 LAB — URINE MICROSCOPIC-ADD ON

## 2013-07-31 MED ORDER — LORATADINE 10 MG PO TABS
10.0000 mg | ORAL_TABLET | Freq: Every day | ORAL | Status: DC
  Administered 2013-08-01 – 2013-08-26 (×26): 10 mg via ORAL
  Filled 2013-07-31 (×27): qty 1

## 2013-07-31 MED ORDER — METOPROLOL SUCCINATE ER 50 MG PO TB24
50.0000 mg | ORAL_TABLET | Freq: Every day | ORAL | Status: DC
  Administered 2013-08-01 – 2013-08-26 (×26): 50 mg via ORAL
  Filled 2013-07-31 (×27): qty 1

## 2013-07-31 MED ORDER — SENNOSIDES-DOCUSATE SODIUM 8.6-50 MG PO TABS
2.0000 | ORAL_TABLET | Freq: Every day | ORAL | Status: DC
  Administered 2013-08-01 – 2013-08-05 (×5): 2 via ORAL
  Filled 2013-07-31 (×5): qty 2

## 2013-07-31 MED ORDER — HYDROCODONE-ACETAMINOPHEN 5-325 MG PO TABS
1.0000 | ORAL_TABLET | ORAL | Status: DC | PRN
  Administered 2013-07-31: 2 via ORAL
  Administered 2013-07-31: 1 via ORAL
  Administered 2013-08-01 – 2013-08-02 (×5): 2 via ORAL
  Filled 2013-07-31 (×7): qty 2

## 2013-07-31 MED ORDER — SODIUM CHLORIDE 0.9 % IJ SOLN
10.0000 mL | INTRAMUSCULAR | Status: DC | PRN
  Administered 2013-07-31: 10 mL

## 2013-07-31 MED ORDER — FLUTICASONE PROPIONATE 50 MCG/ACT NA SUSP
2.0000 | Freq: Every day | NASAL | Status: DC
  Administered 2013-08-02 – 2013-08-26 (×23): 2 via NASAL
  Filled 2013-07-31: qty 16

## 2013-07-31 MED ORDER — DEXTROSE 5 % IV SOLN: 2.0000 g | Freq: Two times a day (BID) | INTRAVENOUS | Status: DC

## 2013-07-31 MED ORDER — VITAMIN D3 25 MCG (1000 UNIT) PO TABS
2000.0000 [IU] | ORAL_TABLET | Freq: Every day | ORAL | Status: DC
  Administered 2013-08-01 – 2013-08-26 (×26): 2000 [IU] via ORAL
  Filled 2013-07-31 (×27): qty 2

## 2013-07-31 MED ORDER — ADULT MULTIVITAMIN W/MINERALS CH
1.0000 | ORAL_TABLET | Freq: Every day | ORAL | Status: DC
  Administered 2013-08-01 – 2013-08-26 (×26): 1 via ORAL
  Filled 2013-07-31 (×27): qty 1

## 2013-07-31 MED ORDER — TRAZODONE HCL 50 MG PO TABS
50.0000 mg | ORAL_TABLET | Freq: Every day | ORAL | Status: DC
  Administered 2013-07-31 – 2013-08-25 (×26): 50 mg via ORAL
  Filled 2013-07-31 (×27): qty 1

## 2013-07-31 MED ORDER — SORBITOL 70 % SOLN
30.0000 mL | Freq: Every day | Status: DC | PRN
  Administered 2013-08-04 – 2013-08-09 (×2): 30 mL via ORAL
  Filled 2013-07-31 (×2): qty 30

## 2013-07-31 MED ORDER — LISINOPRIL 5 MG PO TABS
5.0000 mg | ORAL_TABLET | Freq: Every day | ORAL | Status: DC
  Administered 2013-08-01 – 2013-08-11 (×11): 5 mg via ORAL
  Filled 2013-07-31 (×12): qty 1

## 2013-07-31 MED ORDER — CYANOCOBALAMIN 500 MCG PO TABS
500.0000 ug | ORAL_TABLET | Freq: Every day | ORAL | Status: DC
  Administered 2013-08-01 – 2013-08-06 (×6): 500 ug via ORAL
  Administered 2013-08-07: 12:00:00 via ORAL
  Administered 2013-08-08 – 2013-08-26 (×19): 500 ug via ORAL
  Filled 2013-07-31 (×30): qty 1

## 2013-07-31 MED ORDER — METHOCARBAMOL 500 MG PO TABS
500.0000 mg | ORAL_TABLET | Freq: Four times a day (QID) | ORAL | Status: DC | PRN
  Administered 2013-07-31 – 2013-08-24 (×34): 500 mg via ORAL
  Filled 2013-07-31 (×35): qty 1

## 2013-07-31 MED ORDER — CALCIUM CARBONATE 1250 (500 CA) MG PO TABS
1.0000 | ORAL_TABLET | Freq: Every day | ORAL | Status: DC
  Administered 2013-08-01 – 2013-08-26 (×26): 500 mg via ORAL
  Filled 2013-07-31 (×27): qty 1

## 2013-07-31 MED ORDER — MOMETASONE FURO-FORMOTEROL FUM 100-5 MCG/ACT IN AERO
2.0000 | INHALATION_SPRAY | Freq: Two times a day (BID) | RESPIRATORY_TRACT | Status: DC
  Administered 2013-08-01 – 2013-08-26 (×44): 2 via RESPIRATORY_TRACT
  Filled 2013-07-31 (×3): qty 8.8

## 2013-07-31 MED ORDER — FUROSEMIDE 20 MG PO TABS
20.0000 mg | ORAL_TABLET | Freq: Every day | ORAL | Status: DC
  Administered 2013-08-01 – 2013-08-21 (×21): 20 mg via ORAL
  Filled 2013-07-31 (×23): qty 1

## 2013-07-31 MED ORDER — ASPIRIN 81 MG PO CHEW
81.0000 mg | CHEWABLE_TABLET | Freq: Every day | ORAL | Status: DC
  Administered 2013-08-01 – 2013-08-26 (×26): 81 mg via ORAL
  Filled 2013-07-31 (×25): qty 1

## 2013-07-31 MED ORDER — BISACODYL 10 MG RE SUPP
10.0000 mg | Freq: Every day | RECTAL | Status: DC | PRN
  Administered 2013-08-17: 10 mg via RECTAL
  Filled 2013-07-31 (×3): qty 1

## 2013-07-31 MED ORDER — DEXTROSE 5 % IV SOLN
2.0000 g | Freq: Two times a day (BID) | INTRAVENOUS | Status: DC
  Administered 2013-07-31 – 2013-08-26 (×51): 2 g via INTRAVENOUS
  Filled 2013-07-31 (×55): qty 2

## 2013-07-31 MED ORDER — ONDANSETRON HCL 4 MG PO TABS: 4.0000 mg | ORAL_TABLET | Freq: Three times a day (TID) | ORAL | Status: DC | PRN

## 2013-07-31 MED ORDER — FERROUS SULFATE 325 (65 FE) MG PO TABS
325.0000 mg | ORAL_TABLET | Freq: Two times a day (BID) | ORAL | Status: DC
  Administered 2013-08-01 – 2013-08-03 (×5): 325 mg via ORAL
  Filled 2013-07-31 (×7): qty 1

## 2013-07-31 NOTE — Progress Notes (Signed)
Pt's BP down to 82/30. Called on-call provider and he suggested to re-check it in an hour. Order given if not above 90 at that time. Patient is calm and sleeping comfortably. Will continue to monitor.

## 2013-07-31 NOTE — Interval H&P Note (Signed)
[name] was admitted today to Inpatient Rehabilitation with the diagnosis of cervical epidural abscess with SCI.  The patient's history has been reviewed, patient examined, and there is no change in status.  Patient continues to be appropriate for intensive inpatient rehabilitation.  I have reviewed the patient's chart and labs.  Questions were answered to the patient's satisfaction.  SWARTZ,ZACHARY T 07/31/2013, 6:35 PM

## 2013-07-31 NOTE — Progress Notes (Signed)
Pt appears appropriate for transfer to CIR today.  Meredith Staggers, MD, [place]

## 2013-07-31 NOTE — Progress Notes (Signed)
Per family and  Pt., pt has been having urinary frequency and incontinence since foley cath d/c'd on 3/19. Bladder Scan checked which showed 798 cc of urine. Dr. Ronnald Ramp made aware of pt. Symptoms and bladder scan results. Orders received to place foley cath. Foley placed with little difficulty, with return of >700 cc clear, yellow urine. Pt. Tolerated well. Will monitor.

## 2013-07-31 NOTE — Progress Notes (Signed)
Patient ID: [name], female   DOB: [birth year], 79 y.o.   MRN: [id number] Patient complains of some neck and right shoulder pain. She feels her pain is improving. She is in her cervical collar. She feels like her strength is improving. She seems to have good use of her hands and arms today. There is some ataxia or apraxia, specially with the lower extremities. She does move all extremities. She is awake and pleasant and conversant. Likely stable for transfer to rehabilitation.

## 2013-07-31 NOTE — Interval H&P Note (Deleted)
[name] was admitted today to Inpatient Rehabilitation with the diagnosis of cervical epidural abscess with SCI.  The patient's history has been reviewed, patient examined, and there is no change in status.  Patient continues to be appropriate for intensive inpatient rehabilitation.  I have reviewed the patient's chart and labs.  Questions were answered to the patient's satisfaction.  SWARTZ,ZACHARY T 07/31/2013, 6:33 PM

## 2013-07-31 NOTE — H&P (View-Only) (Signed)
Physical Medicine and Rehabilitation Admission H&P    No chief complaint on file. :  Chief complaint: Bilateral shoulder soreness  HPI: [name] is a 79 y.o. right-handed female with history of hypertension, COPD. Admitted 07/16/2013 with progressive quadriparesis x3 days. Denied any bowel or bladder disturbances. Patient with low-grade fever 102.2 white blood cell count of 14,000. MRI and imaging revealed cervical epidural abscess. Underwent emergent anterior cervical decompression discectomy with fusion multilevel C4-C7 07/16/2013 per Dr. Vertell Limber. Decadron protocol is advised. Followup infectious disease with spinal abscess fluid growing and microaerophilic streptococci and maintained on ceftriaxone as well as vancomycin. Marland Kitchen Postoperative pain management. She remains on intravenous ceftriaxone through 08/26/2013. Physical and occupational therapy evaluations completed 07/17/2013 with recommendations for physical medicine rehabilitation consult. Patient was admitted inpatient rehabilitation services 07/22/2013 with ongoing therapies. Reports by therapy staff as well as patient on progressive weakness diminished triceps and hand intrinsic strength. MRI and imaging completed showing cord compression. Neurosurgery had been following patient and recommendations were made to return for surgical intervention and underwent cervical 4-7 posterior cervical fusion with lateral mass screws, posterior lateral arthrodesis C4-C7 levels and cervical laminectomy C3-T1 07/28/2013 per Dr. Vertell Limber. Patient was admitted for comprehensive rehabilitation program . Patient remained in acute care with therapies reevaluated. A hard cervical collar was placed to remain in place at all times. Postoperative pain management. Patient is readmitted to inpatient rehabilitation services for ongoing comprehensive rehabilitation.   ROS ROS Review of Systems  Respiratory: Positive for shortness of breath.  Gastrointestinal:  Positive for constipation.  Musculoskeletal: Positive for joint pain and myalgias.  Neurological: Positive for weakness. Insomnia  All other systems reviewed and are negative   Past Medical History  Diagnosis Date  . Hypertension   . Asthma   . Cancer     Skin Cancer  . Sleep apnea   . COPD (chronic obstructive pulmonary disease)    Past Surgical History  Procedure Laterality Date  . Gastric bypass    . Anterior cervical decomp/discectomy fusion N/A 07/16/2013    Procedure: ANTERIOR CERVICAL DECOMPRESSION/DISCECTOMY FUSION mutlipleLEVELS C4-7;  Surgeon: Erline Levine, MD;  Location: [facility];  Service: Neurosurgery;  Laterality: N/A;   History reviewed. No pertinent family history. Social History:  reports that she has never smoked. She does not have any smokeless tobacco history on file. She reports that she does not drink alcohol. Her drug history is not on file. Allergies:  Allergies  Allergen Reactions  . Codeine Nausea Only  . Iohexol Itching    07-15-13 pt developed itching on fingers after contrast given. Dr. Irish Elders looked at pt and said to put in system as allergy. BB  . Synvisc [Hylan G-F 20]    Medications Prior to Admission  Medication Sig Dispense Refill  . acetaminophen (TYLENOL) 650 MG CR tablet Take 1,300 mg by mouth every morning.       Marland Kitchen aspirin 81 MG tablet Take 81 mg by mouth every other day. Alternating with vitamin b-12      . calcium carbonate (TUMS EX) 750 MG chewable tablet Chew 1 tablet by mouth daily.      . Cholecalciferol (VITAMIN D) 2000 UNITS CAPS Take 2,000 Units by mouth daily.      . Ferrous Sulfate (SLOW RELEASE IRON PO) Take 1 tablet by mouth daily as needed (for low iron).      . fluticasone (FLONASE) 50 MCG/ACT nasal spray Place 1 spray into both nostrils 2 (two) times daily.       Marland Kitchen  Fluticasone-Salmeterol (ADVAIR) 250-50 MCG/DOSE AEPB Inhale 1 puff into the lungs 2 (two) times daily.      . furosemide (LASIX) 20 MG tablet Take 20 mg by mouth  daily as needed for fluid.       Marland Kitchen ibuprofen (ADVIL,MOTRIN) 200 MG tablet Take 400 mg by mouth at bedtime.       Marland Kitchen lisinopril (PRINIVIL,ZESTRIL) 5 MG tablet Take 5 mg by mouth daily.       Marland Kitchen loratadine (CLARITIN) 10 MG tablet Take 10 mg by mouth daily.      . metoprolol succinate (TOPROL-XL) 50 MG 24 hr tablet Take 50 mg by mouth daily. Take with or immediately following a meal.      . naproxen sodium (ANAPROX) 220 MG tablet Take 220 mg by mouth daily.       Marland Kitchen oxybutynin (DITROPAN) 5 MG tablet Take 2.5 mg by mouth 2 (two) times daily.       . Pediatric Multiple Vit-C-FA (PEDIATRIC MULTIVITAMIN) chewable tablet Chew 1 tablet by mouth daily.      . traZODone (DESYREL) 50 MG tablet Take 50 mg by mouth at bedtime.       . vitamin B-12 (CYANOCOBALAMIN) 250 MCG tablet Take 250 mcg by mouth every other day. Alternating with aspirin        Home: Home Living Family/patient expects to be discharged to:: Inpatient rehab Living Arrangements: Alone Available Help at Discharge: Family;Available 24 hours/day Type of Home: House Home Access: Stairs to enter [employer] of Steps: 3 Entrance Stairs-Rails: Left;Right Home Layout: One level Home Equipment: Shower seat;Tub bench;Walker - 2 wheels;Walker - 4 wheels;Transport chair;Bedside commode;Grab bars - tub/shower;Grab bars - toilet;Hand held shower head;Cane - single point (base for recliner) Additional Comments: baseline patient uses no DME but has DME from bil knee surg in the past - shower chair  Lives With: Alone (with Dog)   Functional History: Prior Function Comments: Read, sew, cook - likes baking cakes and perfecting chili  Functional Status:  Mobility:     Ambulation/Gait Ambulation Distance (Feet): 2 Feet Gait velocity: decreased General Gait Details: Pt very unsteady on feet with poor overall postural stability.  Pt had difficulty moving LEs needing cues and asssit to move them as she was unable to move them.  Pt appears to  have poor proprioception worse on right LE. Bil knees buckling needing max assist to assist pt to recover and stand.      ADL: ADL Eating/Feeding: Set up;Supervision/safety (rec small bites followed by liquids) Where Assessed - Eating/Feeding: Chair Grooming: Moderate assistance Where Assessed - Grooming: Supported sitting Upper Body Bathing: Moderate assistance Where Assessed - Upper Body Bathing: Supported sitting Lower Body Bathing: +1 Total assistance Where Assessed - Lower Body Bathing: Supported sit to stand Upper Body Dressing: Maximal assistance Where Assessed - Upper Body Dressing: Supported sitting Lower Body Dressing: +1 Total assistance Where Assessed - Lower Body Dressing: Supported sit to Tree surgeon Transfer: +2 Total assistance;Moderate assistance Toilet Transfer Method: Stand pivot Equipment Used: Gait belt Transfers/Ambulation Related to ADLs: +2 total A with mod A. Diffficulty maintinaing upright trunk. Able to stand step to chair. RLE increaed weakness. No buckoing of knees. ADL Comments: increaed independence as compared to prior to last surgery. Pt able to use standard call bell to call for nurse and change channel  Cognition: Cognition Overall Cognitive Status: Within Functional Limits for tasks assessed Orientation Level: Oriented X4 Cognition Arousal/Alertness: Awake/alert Behavior During Therapy: WFL for tasks assessed/performed Overall Cognitive Status:  Within Functional Limits for tasks assessed  Physical Exam: Blood pressure 136/60, pulse 69, temperature 97.3 F (36.3 C), temperature source Oral, resp. rate 18, height 5\' 3"  (1.6 m), weight 111.131 kg (245 lb), SpO2 99.00%. Physical Exam Constitutional:  79 year old obese female, no distress. alert HENT: oral mucosa pink and moist Head: Normocephalic.  Eyes: EOM are normal.  Neck: Normal range of motion. Neck supple. No thyromegaly present.  Cardiovascular: Normal rate and regular rhythm. No  murmurs, rubs, or gallops Respiratory: Effort normal and breath sounds normal. No respiratory distress.  GI: Soft. Bowel sounds are normal. She exhibits no distension.  Neurological:   patient is alert and a bit anxious. She is oriented x3 followed basic commands. RUE 2+ to 3- deltoid, bicep, triceps 2, WE 2, HI 2-, LUE 3 to 3+ deltoid, bicep, tricep, wrist and HI. LE: RHF, KE and ankle 1-2/5. LLE 2/5 proximal to distal. Decreased PP and LT/proprioception all 4's, lower more than upper, right more than left. DTR's 1+ Skin: Surgical site clean and dry without swelling.  No results found for this or any previous visit (from the past 48 hour(s)). Dg Cervical Spine 2-3 Views  07/28/2013   CLINICAL DATA:  C4-7 posterior cervical fusion.  EXAM: CERVICAL SPINE - 2-3 VIEW; DG C-ARM 1-60 MIN  COMPARISON:  DG CERVICAL SPINE 2-3 VIEWS dated 07/27/2013  FINDINGS: Three spot lateral fluoroscopic images of the cervical spine are submitted. Bone detail is limited. Based on the preexisting anterior plate and screws extending from C4 through C7, these views demonstrate the placement of pedicle screws at the same levels. The interconnecting rods have not yet been placed. No complications are identified.  IMPRESSION: Intraoperative views during C4-7 posterior fusion. Bone detail is limited.   Electronically Signed   By: Camie Patience M.D.   On: 07/28/2013 16:45   Dg C-arm 1-60 Min  07/28/2013   CLINICAL DATA:  C4-7 posterior cervical fusion.  EXAM: CERVICAL SPINE - 2-3 VIEW; DG C-ARM 1-60 MIN  COMPARISON:  DG CERVICAL SPINE 2-3 VIEWS dated 07/27/2013  FINDINGS: Three spot lateral fluoroscopic images of the cervical spine are submitted. Bone detail is limited. Based on the preexisting anterior plate and screws extending from C4 through C7, these views demonstrate the placement of pedicle screws at the same levels. The interconnecting rods have not yet been placed. No complications are identified.  IMPRESSION: Intraoperative  views during C4-7 posterior fusion. Bone detail is limited.   Electronically Signed   By: Camie Patience M.D.   On: 07/28/2013 16:45    Post Admission Physician Evaluation: 1. Functional deficits secondary  to cervical epidural abscess with incomplete tetraplegia s/p surgical re-exploration and subsequent C3-T1 lami and PLIF. 2. Patient is admitted to receive collaborative, interdisciplinary care between the physiatrist, rehab nursing staff, and therapy team. 3. Patient's level of medical complexity and substantial therapy needs in context of that medical necessity cannot be provided at a lesser intensity of care such as a SNF. 4. Patient has experienced substantial functional loss from his/her baseline which was documented above under the "Functional History" and "Functional Status" headings.  Judging by the patient's diagnosis, physical exam, and functional history, the patient has potential for functional progress which will result in measurable gains while on inpatient rehab.  These gains will be of substantial and practical use upon discharge  in facilitating mobility and self-care at the household level. 5. Physiatrist will provide 24 hour management of medical needs as well as oversight of the therapy plan/treatment  and provide guidance as appropriate regarding the interaction of the two. 6. 24 hour rehab nursing will assist with bladder management, bowel management, safety, skin/wound care, disease management, medication administration, pain management and patient education  and help integrate therapy concepts, techniques,education, etc. 7. PT will assess and treat for/with: Lower extremity strength, range of motion, stamina, balance, functional mobility, safety, adaptive techniques and equipment, NMR, sensation/proprioception, pain mgt, spine precautions, education.   Goals are: min to mod assist. 8. OT will assess and treat for/with: ADL's, functional mobility, safety, upper extremity strength,  adaptive techniques and equipment, NMR, proprioception/position sense, pain mgt, education.   Goals are: min to mod assist. 9. SLP will assess and treat for/with: n/a.  Goals are: n/a. 10. Case Management and Social Worker will assess and treat for psychological issues and discharge planning. 11. Team conference will be held weekly to assess progress toward goals and to determine barriers to discharge. 12. Patient will receive at least 3 hours of therapy per day at least 5 days per week. 13. ELOS: 20-28 days       14. Prognosis:  good   Medical Problem List and Plan: 1. Cervical epidural abscess with tetraplegia. Status post anterior cervical decompression discectomy multilevel C4-C7 07/16/2013 with subsequent cord compression undergoing cervical 4-7 posterior cervical fusion, C4-C7 cervical laminectomy and C3-T1 levels 07/28/2013 2. DVT Prophylaxis/Anticoagulation: SCDs. Monitor for any signs of DVT 3. Pain Management: Neurontin 100 mg 3 times a day, Hydrocodone and Robaxin as needed. Monitor with increased mobility 4.ID/microaerophilic streptococci. Intravenous ceftriaxone through 08/26/2013 5. Neuropsych: This patient is capable of making decisions on her own behalf. 6. Hypertension. Lisinopril 5 mg daily, Lasix 20 mg daily. Monitor with increased mobility 7. Asthma. Continue Dulera puffs twice daily as well as Flonase/CPAP 8. Urinary frequency. Continued itch or pain. Check PVRs x3 9. Obesity. History of gastric bypass 10. Insomnia. Trazodone 50 mg each bedtime.  Meredith Staggers, MD, [facility]  07/30/2013

## 2013-08-01 ENCOUNTER — Inpatient Hospital Stay (HOSPITAL_COMMUNITY): Payer: PRIVATE HEALTH INSURANCE | Admitting: *Deleted

## 2013-08-01 ENCOUNTER — Inpatient Hospital Stay (HOSPITAL_COMMUNITY): Payer: Medicare Other

## 2013-08-01 DIAGNOSIS — G8250 Quadriplegia, unspecified: Secondary | ICD-10-CM

## 2013-08-01 DIAGNOSIS — A491 Streptococcal infection, unspecified site: Secondary | ICD-10-CM

## 2013-08-01 DIAGNOSIS — G061 Intraspinal abscess and granuloma: Secondary | ICD-10-CM

## 2013-08-01 MED ORDER — DIAZEPAM 2 MG PO TABS
2.0000 mg | ORAL_TABLET | Freq: Two times a day (BID) | ORAL | Status: DC | PRN
  Administered 2013-08-01: 2 mg via ORAL
  Filled 2013-08-01 (×2): qty 1

## 2013-08-01 MED ORDER — ENOXAPARIN SODIUM 30 MG/0.3ML ~~LOC~~ SOLN
30.0000 mg | Freq: Two times a day (BID) | SUBCUTANEOUS | Status: DC
  Administered 2013-08-01 – 2013-08-26 (×51): 30 mg via SUBCUTANEOUS
  Filled 2013-08-01 (×59): qty 0.3

## 2013-08-01 NOTE — Evaluation (Signed)
Physical Therapy Assessment and Plan  Patient Details  Name: [name] MRN: [id number] Date of Birth: [birth year]  PT Diagnosis: Difficulty walking, Muscle weakness and Quadriparesis. Rehab Potential: Good ELOS: 21-28 days   Today's Date: 08/01/2013 Time: 6063-0160 Time Calculation (min): 55 min  Problem List:  Patient Active Problem List   Diagnosis Date Noted  . Abscess in epidural space of cervical spine 07/30/2013  . Epidural abscess (embolic) of spinal cord 10/93/2355  . Epidural abscess 07/22/2013  . Spinal epidural abscess 07/16/2013  . Acute respiratory failure 07/16/2013  . OSA on CPAP 07/16/2013    Past Medical History:  Past Medical History  Diagnosis Date  . Hypertension   . Asthma   . Cancer     Skin Cancer  . Sleep apnea   . COPD (chronic obstructive pulmonary disease)    Past Surgical History:  Past Surgical History  Procedure Laterality Date  . Gastric bypass    . Anterior cervical decomp/discectomy fusion N/A 07/16/2013    Procedure: ANTERIOR CERVICAL DECOMPRESSION/DISCECTOMY FUSION mutlipleLEVELS C4-7;  Surgeon: Erline Levine, MD;  Location: [place] [facility];  Service: Neurosurgery;  Laterality: N/A;  . Posterior cervical fusion/foraminotomy N/A 07/28/2013    Procedure: Cervical four-seven  posterior cervical fusion;  Surgeon: Erline Levine, MD;  Location: [place] [facility];  Service: Neurosurgery;  Laterality: N/A;    Assessment & Plan Clinical Impression: . A 79 y.o. right-handed female with history of hypertension, COPD. Admitted 07/16/2013 with progressive quadriparesis x3 days. Denied any bowel or bladder disturbances. Patient with low-grade fever 102.2 white blood cell count of 14,000. MRI and imaging revealed cervical epidural abscess. Underwent emergent anterior cervical decompression discectomy with fusion multilevel C4-C7 07/16/2013 per Dr. Vertell Limber. Decadron protocol is advised. Followup infectious disease with spinal abscess fluid growing and  microaerophilic streptococci and maintained on ceftriaxone as well as vancomycin. Marland Kitchen Postoperative pain management. She remains on intravenous ceftriaxone through 08/26/2013. Physical and occupational therapy evaluations completed 07/17/2013 with recommendations for physical medicine rehabilitation consult. Patient was admitted inpatient rehabilitation services 07/22/2013 with ongoing therapies. Reports by therapy staff as well as patient on progressive weakness, diminished triceps and hand intrinsic strength. MRI and imaging completed showing cord compression. Neurosurgery had been following patient and recommendations were made to return for surgical intervention and underwent cervical 4-7 posterior cervical fusion with lateral mass screws, posterior lateral arthrodesis C4-C7 levels and cervical laminectomy C3-T1 07/28/2013 per Dr. Vertell Limber. Patient remained in acute care with therapies reevaluated. A hard cervical collar was placed to remain in place at all times. Postoperative pain management. Patient transferred to CIR on 07/31/2013 .   Patient currently requires max with mobility secondary to muscle weakness and motor apraxia and decreased coordination.  Prior to hospitalization, patient was independent  with mobility and lived with Alone in a home.  Home access is 3 or 4Stairs to enter.  Patient will benefit from skilled PT intervention to decrease caregiver burden for planned discharge home with 24 hour assist.  Anticipate patient will benefit from follow up [place] at discharge.  PT - End of Session Endurance Deficit: Yes PT Assessment Rehab Potential: Good Barriers to Discharge: Decreased caregiver support PT Patient demonstrates impairments in the following area(s): Balance;Endurance;Motor;Pain;Safety;Sensory PT Transfers Functional Problem(s): Bed Mobility;Bed to Chair;Car;Furniture PT Locomotion Functional Problem(s): Ambulation;Wheelchair Mobility;Stairs PT Plan PT Intensity: Minimum of 1-2 x/day  ,45 to 90 minutes PT Frequency: 5 out of 7 days PT Duration Estimated Length of Stay: 21-28 days PT Treatment/Interventions: Ambulation/gait training;Balance/vestibular training;Community reintegration;Discharge planning;DME/adaptive equipment  instruction;Functional electrical stimulation;Functional mobility training;Neuromuscular re-education;Pain management;Patient/family education;Splinting/orthotics;Stair training;Therapeutic Activities;Therapeutic Exercise;UE/LE Strength taining/ROM;UE/LE Coordination activities;Wheelchair propulsion/positioning;Disease management/prevention;Psychosocial support PT Transfers Anticipated Outcome(s): Supervision PT Locomotion Anticipated Outcome(s): Supervision PT Recommendation Follow Up Recommendations: Home health PT;24 hour supervision/assistance Patient destination: Home Equipment Details: To be determined  Skilled Therapeutic Intervention Pt received sitting up in bed with reports of intolerable pain in BUE, a burning pain. Pt assisted in repositioning in bed with immediate pain relief. Pt agreeable to therapy. Cervical precautions reviewed with pt verbalizing understanding. Pt instructed in log rolling technique and pt required max A to complete supine to sit transfer from left sidelying due to decreased UE strength (tricepts). Pt able to maintain static sitting balance with SBA, even without UE support, but only for short periods of time. Balance assessed fully sitting EOB, including reaching. Attempted sit to stand transfers with RW, but pt unable to achieve full upright position, pt returned to EOB. Upon 2nd sit to stand pt achieved full standing with HHA and PT in front of pt. Pt able to take 2-3 steps during stand pivot tsf to chair with mod to max A. Pt positioned comfortably in chair and pain reported at a 4/10 reduced from a 10/10. All needs med and nsg notified.   PT Evaluation Precautions/Restrictions Precautions Precautions:  Cervical;Fall Precaution Comments: Parasthesias Required Braces or Orthoses: Cervical Brace Cervical Brace: Hard collar;At all times Restrictions Weight Bearing Restrictions: No Other Position/Activity Restrictions: no lifting, pushing, or heaving lifting General Chart Reviewed: Yes Family/Caregiver Present: No Vital SignsOxygen Therapy SpO2: 92 % O2 Device: None (Room air) Pain Pain Assessment Pain Assessment: 0-10 Pain Score: 3  ("much better") Pain Type: Acute pain Pain Location: Arm Pain Orientation: Right Pain Descriptors / Indicators: Aching Pain Onset: On-going Patients Stated Pain Goal: 3 Pain Intervention(s): Medication (See eMAR);Repositioned Home Living/Prior Functioning Home Living Available Help at Discharge: Family;Other (Comment) (Pt reports her sister may be available to assist, but doesn't want to burden her family. Would be able to hire assist if needed. ) Type of Home: House Home Access: Stairs to enter [employer] of Steps: 3 or 4 Entrance Stairs-Rails: None (Pt reports the ramp used for her husband was recently removed so the railings are not currently in place, but can be put up or the ramp can be returned if needed. ) Home Layout: One level Additional Comments: Pt was independent with ambulation prior to surgery/baseline without DME. Pt does have shower seat, BSC, RW and SPC  Lives With: Alone Prior Function Level of Independence: Independent with basic ADLs;Independent with homemaking with ambulation  Able to Take Stairs?: Yes Driving: Yes Vocation: Retired Leisure: Hobbies-yes (Comment) Comments: Pt enjoyrs sewing, cooking Vision/Perception  Vision - History Baseline Vision: Wears glasses all the time Visual History: Cataracts Patient Visual Report: No change from baseline Vision - Assessment Eye Alignment: Within Functional Limits Vision Assessment: Vision tested Additional Comments: Pt able to read small print on newspaper in room  and no blurry or double vision noted.  Perception Perception: Within Functional Limits Praxis Praxis: Intact  Cognition Overall Cognitive Status: Within Functional Limits for tasks assessed Arousal/Alertness: Awake/alert Orientation Level: Oriented X4 Attention: Focused Focused Attention: Appears intact Alternating Attention: Appears intact Memory: Appears intact Awareness: Appears intact Problem Solving: Appears intact Sensation Sensation Light Touch: Impaired Detail Light Touch Impaired Details: Impaired RLE (LLE intact to light touch. RLe decreased to light touch as compared to the LLE. ) Stereognosis: Appears Intact Proprioception: Appears Intact Coordination Gross Motor Movements are Fluid and Coordinated:  Yes Fine Motor Movements are Fluid and Coordinated: No Finger Nose Finger Test: Impaired due to BUE weakness, R>L. Unable to fully assess RUE due to increased pain.  Motor     Mobility Bed Mobility Bed Mobility: Rolling Left;Right Sidelying to Sit;Supine to Sit;Sitting - Scoot to Edge of Bed Rolling Left: 3: Mod assist Rolling Left Details: Tactile cues for initiation;Tactile cues for sequencing;Verbal cues for sequencing;Verbal cues for technique;Verbal cues for precautions/safety left Sidelying to Sit: 2: Max assist left Sidelying to Sit Details: Tactile cues for initiation;Tactile cues for sequencing;Tactile cues for weight shifting;Tactile cues for posture;Verbal cues for sequencing;Verbal cues for technique;Verbal cues for precautions/safety Supine to Sit: 2: Max assist Supine to Sit Details: Tactile cues for initiation;Tactile cues for sequencing;Tactile cues for weight shifting;Verbal cues for sequencing;Verbal cues for technique;Verbal cues for precautions/safety;Tactile cues for posture Sitting - Scoot to Edge of Bed: 3: Mod assist Sitting - Scoot to Edge of Bed Details: Tactile cues for initiation;Tactile cues for sequencing;Tactile cues for weight  shifting;Tactile cues for posture;Verbal cues for sequencing;Verbal cues for technique;Verbal cues for precautions/safety Transfers Transfers: Yes Sit to Stand: 2: Max assist Sit to Stand Details: Tactile cues for initiation;Tactile cues for sequencing;Tactile cues for weight shifting;Tactile cues for posture;Verbal cues for sequencing;Verbal cues for technique;Verbal cues for precautions/safety;Manual facilitation for weight shifting Sit to Stand Details (indicate cue type and reason): Attempted sit to stand with RW. Pt unable to achieve full upright position. Pt able to achieve upright position with HHA of PT in front of pt and Stand pivot tsf to recliner chair completed.  Stand to Sit: 2: Max assist Stand to Sit Details (indicate cue type and reason): Tactile cues for sequencing;Tactile cues for weight shifting;Verbal cues for sequencing;Verbal cues for technique;Verbal cues for precautions/safety;Tactile cues for posture Stand Pivot Transfers: 2: Max assist Stand Pivot Transfer Details: Tactile cues for initiation;Tactile cues for sequencing;Tactile cues for weight shifting;Tactile cues for placement;Tactile cues for posture;Verbal cues for sequencing;Verbal cues for technique;Verbal cues for precautions/safety Locomotion  Ambulation Ambulation: No (not assessed at this time due to weakness and poor trunk control) Ambulation/Gait Assistance: Not tested (comment)  Trunk/Postural Assessment  Cervical Assessment Cervical Assessment: Exceptions to [facility] Cervical Strength Overall Cervical Strength Comments: [place] J collar in place. Movements restricted secondary to surgery.  Thoracic Assessment Thoracic Assessment: Within Functional Limits Lumbar Assessment Lumbar Assessment: Within Functional Limits Postural Control Postural Control: Deficits on evaluation Righting Reactions: Impaired in sitting and standing Postural Limitations: Impaired in sitting and standing  Balance Balance Balance  Assessed: Yes Static Sitting Balance Static Sitting - Balance Support: No upper extremity supported;Feet unsupported Static Sitting - Level of Assistance: 5: Stand by assistance Static Sitting - Comment/# of Minutes: Pt able to maintain sitting balance without UE at close supervision, but for only short periods of time: 30-45 seconds without UE support Dynamic Sitting Balance Dynamic Sitting - Balance Support: Feet supported Dynamic Sitting - Level of Assistance: 4: Min assist Dynamic Sitting Balance - Compensations: Pt able to reach with unilateral UE support just inside BOS, reaching FWD, left and right.  Dynamic Sitting - Balance Activities: Lateral lean/weight shifting;Forward lean/weight shifting Sitting balance - Comments: Very flexed posture, flexed trunk Extremity Assessment      RLE Assessment RLE Assessment: Exceptions to [facility] RLE AROM (degrees) Overall AROM Right Lower Extremity: Within functional limits for tasks assessed RLE Overall AROM Comments: although  limited by body habitus RLE Strength RLE Overall Strength: Deficits RLE Overall Strength Comments: Overall 3-/5 LLE Assessment LLE Assessment:  Exceptions to [facility] LLE AROM (degrees) Overall AROM Left Lower Extremity: Within functional limits for tasks assessed LLE Overall AROM Comments: limited by body habitus LLE Strength LLE Overall Strength: Within Functional Limits for tasks assessed Left Hip Flexion: 3+/5  FIM:  FIM - Bed/Chair Transfer Bed/Chair Transfer Assistive Devices: Bed rails;Arm rests Bed/Chair Transfer: 2: Supine > Sit: Max A (lifting assist/Pt. 25-49%);2: Bed > Chair or W/C: Max A (lift and lower assist);2: Chair or W/C > Bed: Max A (lift and lower assist) FIM - Locomotion: Wheelchair Locomotion: Wheelchair: 0: Activity did not occur FIM - Locomotion: Ambulation Ambulation/Gait Assistance: Not tested (comment) FIM - Locomotion: Stairs Locomotion: Stairs: 0: Activity did not occur   Refer to Care  Plan for Long Term Goals  Recommendations for other services: None  Discharge Criteria: Patient will be discharged from PT if patient refuses treatment 3 consecutive times without medical reason, if treatment goals not met, if there is a change in medical status, if patient makes no progress towards goals or if patient is discharged from hospital.  The above assessment, treatment plan, treatment alternatives and goals were discussed and mutually agreed upon: by patient  Lillia Corporal R 08/01/2013, 12:29 PM

## 2013-08-01 NOTE — Progress Notes (Signed)
[name]     PROGRESS NOTE    Subjective/Complaints: Had a very good night. Denies any anxiety. Pain controlled. Slept quite well. Ready to start therapies today. A 12 point review of systems has been performed and if not noted above is otherwise negative.   Objective: Vital Signs: Blood pressure 143/82, pulse 87, temperature 98.4 F (36.9 C), temperature source Oral, resp. rate 18, SpO2 92.00%. No results found. No results found for this basename: WBC, HGB, HCT, PLT,  in the last 72 hours No results found for this basename: NA, K, CL, CO, GLUCOSE, BUN, CREATININE, CALCIUM,  in the last 72 hours CBG (last 3)  No results found for this basename: GLUCAP,  in the last 72 hours  Wt Readings from Last 3 Encounters:  07/30/13 111.131 kg (245 lb)  07/30/13 111.131 kg (245 lb)  07/20/13 79.561 kg (175 lb 6.4 oz)    Physical Exam:  Constitutional:  79 year old obese female, no distress. alert  HENT: oral mucosa pink and moist  Head: Normocephalic.  Eyes: EOM are normal.  Neck: Normal range of motion. Neck supple. No thyromegaly present.  Cardiovascular: Normal rate and regular rhythm. No murmurs, rubs, or gallops  Respiratory: Effort normal and breath sounds normal. No respiratory distress.  GI: Soft. Bowel sounds are normal. She exhibits no distension.  Neurological:  patient is alert and a bit anxious. She is oriented x3 followed basic commands. RUE   3- deltoid, bicep, triceps 2, WE 3, HI 3-, LUE 3 to 3+ deltoid, bicep, tricep, wrist and HI. LE: RHF, KE and ankle 2+ to 3+/5. LLE 2/5 proximal to distal. Decreased PP and LT/proprioception all 4's, lower more than upper, right more than left. DTR's 1+  Skin: Surgical site clean and dry without swelling Psych: pleasant, cooperative, NON-anxious  Assessment/Plan: 1. Functional deficits secondary to cervical epidural abscess with myelopathy s/p C4-7 laminectomy and fusion C3-T1, which require 3+  hours per day of interdisciplinary therapy in a comprehensive inpatient rehab setting. Physiatrist is providing close team supervision and 24 hour management of active medical problems listed below. Physiatrist and rehab team continue to assess barriers to discharge/monitor patient progress toward functional and medical goals. FIM:                   Comprehension Comprehension Mode: Auditory Comprehension: 4-Understands basic 75 - 89% of the time/requires cueing 10 - 24% of the time  Expression Expression Mode: Verbal Expression: 5-Expresses basic needs/ideas: With extra time/assistive device  Social Interaction Social Interaction: 6-Interacts appropriately with others with medication or extra time (anti-anxiety, antidepressant).  Problem Solving Problem Solving: 4-Solves basic 75 - 89% of the time/requires cueing 10 - 24% of the time  Memory Memory: 5-Recognizes or recalls 90% of the time/requires cueing < 10% of the time  Medical Problem List and Plan:  1. Cervical epidural abscess with tetraplegia. Status post anterior cervical decompression discectomy multilevel C4-C7 07/16/2013 with subsequent cord compression undergoing cervical 4-7 posterior cervical fusion, C4-C7 cervical laminectomy and C3-T1 levels 07/28/2013  2. DVT Prophylaxis/Anticoagulation: SCDs. Add lovenox 30 q12. Check dopplers 3. Pain Management: Neurontin 100 mg 3 times a day, Hydrocodone and Robaxin as needed. Monitor with increased mobility  4.ID/microaerophilic streptococci. Intravenous ceftriaxone through 08/26/2013  5. Neuropsych: This patient is capable of making decisions on her own behalf.  -low dose valium prn for anxiety (limit as much as possible)  6. Hypertension. Lisinopril 5 mg daily, Lasix 20 mg daily. Monitor with increased mobility  7. Asthma. Continue Dulera puffs twice daily as well as Flonase/CPAP  8. Urinary frequency. Continued itch or pain. Check PVRs x3  9. Obesity. History of  gastric bypass  10. Insomnia. Trazodone 50 mg each bedtime  LOS (Days) 1 A FACE TO FACE EVALUATION WAS PERFORMED  SWARTZ,ZACHARY T 08/01/2013 9:19 AM

## 2013-08-01 NOTE — Evaluation (Signed)
Occupational Therapy Assessment and Plan  Patient Details  Name: [name] MRN: [id number] Date of Birth: [birth year]  OT Diagnosis: abnormal posture, acute pain and quadriparesis at level C5-7 Rehab Potential: Rehab Potential: Good ELOS: 3-4 weeks   Today's Date: 08/01/2013 Time:  -     Problem List:  Patient Active Problem List   Diagnosis Date Noted  . Abscess in epidural space of cervical spine 07/30/2013  . Epidural abscess (embolic) of spinal cord 54/62/7035  . Epidural abscess 07/22/2013  . Spinal epidural abscess 07/16/2013  . Acute respiratory failure 07/16/2013  . OSA on CPAP 07/16/2013    Past Medical History:  Past Medical History  Diagnosis Date  . Hypertension   . Asthma   . Cancer     Skin Cancer  . Sleep apnea   . COPD (chronic obstructive pulmonary disease)    Past Surgical History:  Past Surgical History  Procedure Laterality Date  . Gastric bypass    . Anterior cervical decomp/discectomy fusion N/A 07/16/2013    Procedure: ANTERIOR CERVICAL DECOMPRESSION/DISCECTOMY FUSION mutlipleLEVELS C4-7;  Surgeon: Erline Levine, MD;  Location: [facility];  Service: Neurosurgery;  Laterality: N/A;  . Posterior cervical fusion/foraminotomy N/A 07/28/2013    Procedure: Cervical four-seven  posterior cervical fusion;  Surgeon: Erline Levine, MD;  Location: [facility];  Service: Neurosurgery;  Laterality: N/A;    Assessment & Plan Clinical Impression:   History 79 yo female admitted with spinal epidural abscess undergoing ACDF. Pt transferred to CIR, developed increased weakness. Underwent cervical four-seven posterior cervical fusion (N/A) with lateral mass screws, posterolateral arthrodesis C 4 - C 7 levels and cervical laminectomy C 3 - T 1 levels 3/18.    .  Patient transferred to CIR on 07/31/2013 .    Patient currently requires max with basic self-care skills secondary to muscle weakness and muscle paralysis and abnormal tone, unbalanced muscle activation  and decreased coordination.  Prior to hospitalization, patient could complete BADL with independent .  Patient will benefit from skilled intervention to increase independence with basic self-care skills prior to discharge home with care partner.  Anticipate patient will require 24 hour supervision and follow up home health.  OT - End of Session Activity Tolerance: Tolerates 30+ min activity with multiple rests Endurance Deficit: Yes Endurance Deficit Description: sweats and hurts OT Assessment Rehab Potential: Good Barriers to Discharge: Decreased caregiver support OT Patient demonstrates impairments in the following area(s): Balance;Endurance;Motor;Pain;Safety;Skin Integrity;Sensory OT Basic ADL's Functional Problem(s): Eating;Grooming;Bathing;Dressing;Toileting OT Transfers Functional Problem(s): Toilet;Tub/Shower OT Additional Impairment(s): Fuctional Use of Upper Extremity OT Plan OT Intensity: Minimum of 1-2 x/day, 45 to 90 minutes OT Frequency: 5 out of 7 days OT Duration/Estimated Length of Stay: 3-4 weeks OT Treatment/Interventions: Therapeutic Exercise;Therapeutic Activities;Self Care/advanced ADL retraining;UE/LE Strength taining/ROM;Discharge planning;Functional mobility training;Patient/family education;DME/adaptive equipment instruction;Neuromuscular re-education;Balance/vestibular training;UE/LE Coordination activities OT Self Feeding Anticipated Outcome(s): modified I OT Basic Self-Care Anticipated Outcome(s): min assist OT Toileting Anticipated Outcome(s): minimal assist OT Bathroom Transfers Anticipated Outcome(s): minimal assit OT Recommendation Patient destination: Home Follow Up Recommendations: 24 hour supervision/assistance;Home health OT Equipment Recommended: To be determined;Tub/shower bench;3 in 1 bedside comode Equipment Details: Shower chair, tub bench, grab bars   Skilled Therapeutic Intervention   OT Evaluation Precautions/Restrictions   Precautions Precautions: Cervical;Fall Precaution Comments: Parasthesias Required Braces or Orthoses: Cervical Brace Cervical Brace: Hard collar;At all times Restrictions Weight Bearing Restrictions: No Other Position/Activity Restrictions: no lifting, pushing, or heaving lifting General   Vital Signs   Pain Pain Assessment Pain Score:  Asleep Pain Type: Acute pain Pain Location: Shoulder Pain Radiating Towards: back/ arms Pain Descriptors / Indicators: Aching Patients Stated Pain Goal: 3 Pain Intervention(s): Medication (See eMAR);Repositioned Home Living/Prior Functioning Home Living Available Help at Discharge: Family;Other (Comment) Type of Home: Apartment Home Access: Stairs to enter Entrance Stairs-Number of Steps: 3 or 4 Entrance Stairs-Rails: None Home Layout: One level Additional Comments: Pt was independent with ambulation prior to surgery/baseline without DME. 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, hospital  Leisure: Hobbies-yes (Comment) Comments: Pt enjoyrs sewing, cooking ADL   Vision/Perception  Vision - History Baseline Vision: Wears glasses all the time Visual History: Cataracts Patient Visual Report: No change from baseline Vision - Assessment Eye Alignment: Within Functional Limits Vision Assessment: Vision tested Perception Perception: Within Functional Limits Praxis Praxis: Intact  Cognition Overall Cognitive Status: Within Functional Limits for tasks  assessed Arousal/Alertness: Awake/alert Orientation Level: Oriented X4 Sensation Sensation Light Touch: Impaired Detail Light Touch Impaired Details: Impaired RUE;Impaired LUE (on 4th and 5th fingers for light touch) Coordination Gross Motor Movements are Fluid and Coordinated: No Fine Motor Movements are Fluid and Coordinated: No Coordination and Movement Description: impaired by bil UE weakness Finger Nose Finger Test: Impaired due to BUE weakness, R>L. Unable to fully assess RUE due to increased pain.  Motor  Motor Motor: Tetraplegia Motor - Skilled Clinical Observations: uses foam grips for feeding self Mobility  Transfers Sit to Stand: 2: Max assist Sit to Stand Details: Tactile cues for initiation;Tactile cues for sequencing;Tactile cues for weight shifting;Tactile cues for posture;Verbal cues for sequencing;Verbal cues for technique;Verbal cues for precautions/safety;Manual facilitation for weight shifting Stand to Sit: 2: Max assist Stand to Sit Details (indicate cue type and reason): Tactile cues for sequencing;Tactile cues for weight shifting;Verbal cues for sequencing;Verbal cues for technique;Verbal cues for precautions/safety;Tactile cues for posture  Trunk/Postural Assessment  Cervical Assessment Cervical Assessment: Exceptions to [facility] Cervical Strength Overall Cervical Strength Comments: [place] J collar in place. Movements restricted secondary to surgery.  Thoracic Assessment Thoracic Assessment: Exceptions to [facility] Thoracic AROM Overall Thoracic AROM: Deficits (decrease upright posture) Lumbar Assessment Lumbar Assessment: Exceptions to [facility] Lumbar Strength Overall Lumbar Strength Comments: increased post pelvic tilt Postural Control Postural Control: Deficits on evaluation Righting Reactions: Impaired in sitting and standing Postural Limitations: Impaired in sitting and standing  Balance Balance Balance Assessed: Yes Static Sitting Balance Static Sitting -  Balance Support: No upper extremity supported;Feet unsupported Static Sitting - Level of Assistance: 5: Stand by assistance Dynamic Sitting Balance Dynamic Sitting - Balance Support: Feet supported Dynamic Sitting - Level of Assistance: 4: Min assist Dynamic Sitting Balance - Compensations: Pt able to reach with unilateral UE support just inside BOS, reaching FWD, left and right.  Static Standing Balance Static Standing - Balance Support: Bilateral upper extremity supported Static Standing - Level of Assistance: 3: Mod assist (Mod assist for 1 min) Dynamic Standing Balance Dynamic Standing - Balance Support: Bilateral upper extremity supported;Left upper extremity supported;Right upper extremity supported Dynamic Standing - Level of Assistance: 2: Max assist Extremity/Trunk Assessment RUE Assessment RUE Assessment: Exceptions to [facility] RUE Strength RUE Overall Strength: Deficits Grip (lbs): 5 lbs LUE  Assessment LUE Assessment: Exceptions to [facility] LUE Strength LUE Overall Strength: Deficits Gross Grasp: Impaired  FIM:  FIM - Eating Eating Activity: 5: Set-up assist for open containers FIM - Bed/Chair Transfer Bed/Chair Transfer: 1: Mechanical lift (sara lift)   Refer to Care Plan for Long Term Goals  Recommendations for other services: Neuropsych  Discharge Criteria: Patient will be discharged from OT if patient refuses treatment 3 consecutive times without medical reason, if treatment goals not met, if there is a change in medical status, if patient makes no progress towards goals or if patient is discharged from hospital.  The above assessment, treatment plan, treatment alternatives and goals were discussed and mutually agreed upon: by patient  Lisa Roca 08/01/2013, 7:42 PM

## 2013-08-01 NOTE — Plan of Care (Signed)
Problem: SCI BLADDER ELIMINATION Goal: RH STG MANAGE BLADDER WITH ASSISTANCE STG Manage Bladder With mod. Assistance  Outcome: Not Progressing Foley catheter remains in place.  Urinary retention

## 2013-08-02 ENCOUNTER — Inpatient Hospital Stay (HOSPITAL_COMMUNITY): Payer: PRIVATE HEALTH INSURANCE

## 2013-08-02 ENCOUNTER — Inpatient Hospital Stay (HOSPITAL_COMMUNITY): Payer: Medicare Other | Admitting: Physical Therapy

## 2013-08-02 DIAGNOSIS — M7989 Other specified soft tissue disorders: Secondary | ICD-10-CM

## 2013-08-02 DIAGNOSIS — G061 Intraspinal abscess and granuloma: Secondary | ICD-10-CM

## 2013-08-02 DIAGNOSIS — G8250 Quadriplegia, unspecified: Secondary | ICD-10-CM

## 2013-08-02 LAB — COMPREHENSIVE METABOLIC PANEL
ALBUMIN: 2.4 g/dL — AB (ref 3.5–5.2)
ALT: 10 U/L (ref 0–35)
AST: 15 U/L (ref 0–37)
Alkaline Phosphatase: 62 U/L (ref 39–117)
BUN: 10 mg/dL (ref 6–23)
CALCIUM: 8.9 mg/dL (ref 8.4–10.5)
CO2: 31 mEq/L (ref 19–32)
CREATININE: 0.44 mg/dL — AB (ref 0.50–1.10)
Chloride: 98 mEq/L (ref 96–112)
GFR calc Af Amer: >90 mL/min (ref >90)
Glucose, Bld: 113 mg/dL — ABNORMAL HIGH (ref 70–99)
Potassium: 4.2 mEq/L (ref 3.7–5.3)
Sodium: 139 mEq/L (ref 137–147)
Total Bilirubin: 0.3 mg/dL (ref 0.3–1.2)
Total Protein: 6.5 g/dL (ref 6.0–8.3)

## 2013-08-02 LAB — CBC
HCT: 31.6 % — ABNORMAL LOW (ref 36.0–46.0)
HEMOGLOBIN: 10.2 g/dL — AB (ref 12.0–15.0)
MCH: 30.7 pg (ref 26.0–34.0)
MCHC: 32.3 g/dL (ref 30.0–36.0)
MCV: 95.2 fL (ref 78.0–100.0)
Platelets: 281 10*3/uL (ref 150–400)
RBC: 3.32 MIL/uL — AB (ref 3.87–5.11)
RDW: 13.9 % (ref 11.5–15.5)
WBC: 8 10*3/uL (ref 4.0–10.5)

## 2013-08-02 MED ORDER — BISACODYL 10 MG RE SUPP
10.0000 mg | Freq: Once | RECTAL | Status: AC
  Administered 2013-08-02: 10 mg via RECTAL
  Filled 2013-08-02: qty 1

## 2013-08-02 MED ORDER — SORBITOL 70 % SOLN
30.0000 mL | Status: AC
  Administered 2013-08-02: 30 mL via ORAL
  Filled 2013-08-02: qty 30

## 2013-08-02 MED ORDER — DIAZEPAM 2 MG PO TABS
2.0000 mg | ORAL_TABLET | Freq: Three times a day (TID) | ORAL | Status: DC | PRN
  Administered 2013-08-03 – 2013-08-18 (×4): 2 mg via ORAL
  Filled 2013-08-02 (×6): qty 1

## 2013-08-02 MED ORDER — OXYCODONE-ACETAMINOPHEN 5-325 MG PO TABS
1.0000 | ORAL_TABLET | Freq: Four times a day (QID) | ORAL | Status: DC | PRN
  Administered 2013-08-02 – 2013-08-05 (×7): 2 via ORAL
  Administered 2013-08-05: 1 via ORAL
  Administered 2013-08-06 – 2013-08-07 (×5): 2 via ORAL
  Administered 2013-08-07: 1 via ORAL
  Administered 2013-08-08 – 2013-08-10 (×9): 2 via ORAL
  Administered 2013-08-11: 1 via ORAL
  Administered 2013-08-11 – 2013-08-20 (×21): 2 via ORAL
  Administered 2013-08-21: 1 via ORAL
  Administered 2013-08-21: 2 via ORAL
  Administered 2013-08-22: 1 via ORAL
  Administered 2013-08-22 – 2013-08-26 (×8): 2 via ORAL
  Filled 2013-08-02 (×29): qty 2
  Filled 2013-08-02 (×2): qty 1
  Filled 2013-08-02 (×10): qty 2
  Filled 2013-08-02: qty 1
  Filled 2013-08-02 (×17): qty 2

## 2013-08-02 MED ORDER — SODIUM CHLORIDE 0.9 % IJ SOLN
10.0000 mL | INTRAMUSCULAR | Status: DC | PRN
Start: 2013-08-02 — End: 2013-08-26
  Administered 2013-08-02 – 2013-08-25 (×26): 10 mL

## 2013-08-02 MED ORDER — GABAPENTIN 100 MG PO CAPS
100.0000 mg | ORAL_CAPSULE | Freq: Three times a day (TID) | ORAL | Status: DC
  Administered 2013-08-02 – 2013-08-26 (×73): 100 mg via ORAL
  Filled 2013-08-02 (×77): qty 1

## 2013-08-02 NOTE — Progress Notes (Signed)
Occupational Therapy Session Note  Patient Details  Name: [name] MRN: [id number] Date of Birth: November 12, 1934  Today's Date: 08/02/2013 Time: 0830-0930 Time Calculation (min): 60 min  Short Term Goals: Week 1:  OT Short Term Goal 1 (Week 1): Patient will complete lower body dressing with mod assist. OT Short Term Goal 2 (Week 1): Patient will complete toileting with min assist for thoroughness with toilet hygiene OT Short Term Goal 3 (Week 1): Patient will perform tranfer to tub bench with mod assist OT Short Term Goal 4 (Week 1): Patient will demo improved [place] of bil hands as evidenced by ability open containers independently during self-feeding  Skilled Therapeutic Interventions/Progress Updates: ADL-retraining with focus on bed mobility, functional transfer, upper body bathing, grooming at sink (hair, teeth), endurance.   Patient received in bed, receptive for therapy without complaint of pain.  Patient completed bed mobility, rolling to her left side-lying with max assist to position LE and total assist to lift trunk off bed to sit at edge of bed.   Patient was able to maintain static sitting balance using bed rail, while OT provided setup assist with placing w/c at bedside.   Patient completed sit >< stand 1 time with mod A (pt = 60%) and agreed to attempt stand or squat pivot transfer to w/c.    Patient performed transfer to w/c with max assist to advance right leg and pivot.   During transfer w/c moved (with brakes locked) resulting in incomplete seated positioning toward seat backrest.   OT attempted manual re-positioning adjustment (lifting patient from behind while patient attempted to weight shift posterior) without success.   After educating patient on plan to stand at sink for improved repositioning, patient slipped off front of w/c to floor and was recovered w/o injury using mech lift and sling.   Patient reported no injury, RN notified and assessed fall.   Pt then expressed  desire to continue ADL, seated in recliner at sink and requested shampoo cap for hair care.   Patient completed hair care and oral hygiene using foam tubing and coban to increase grip size, with only setup assist.   OT session ended as physical therapist arrived for next scheduled therapy session.     Therapy Documentation Precautions:  Precautions Precautions: Cervical;Fall Precaution Comments: Parasthesias Required Braces or Orthoses: Cervical Brace Cervical Brace: Hard collar;At all times Restrictions Weight Bearing Restrictions: No Other Position/Activity Restrictions: no lifting, pushing, or heaving lifting  Pain: Pain Assessment Pain Score: 0-No pain  See FIM for current functional status  Therapy/Group: Individual Therapy  Second session: Time: 1345-1430 Time Calculation (min):  45 min  Pain Assessment: No pain  Skilled Therapeutic Interventions: ADL-retraining with focus on lower body bathing and dressing skills, supine, toileting needs, and bed mobility.   Patient educated on supine lower body bathing tasks using bed mobility (rolling right and left), use of LH sponge, and bridging at hips.  Patient completed roll to right side with mod assist and vc to bend her knees and to place hands on bed rails.   Bathing completed with total assist however patient was able to wash lower abdomin using washcloth.   RN notified of mild skin irritation at skin fold; per patient she uses a "concoction" for diaper rash effectively to reduce irritation.   OT advised patient that since she has had no BM since arriving and is dependent for transfers with poor static sitting balance, use of protective diaper would be advised; OT provided total assist  to don diaper.   A bariatric BSC was also provided however not currently recommended d/t patient's poor static sitting balance.   See FIM for current functional status  Therapy/Group: Individual Therapy  Harman 08/02/2013, 12:21 PM

## 2013-08-02 NOTE — IPOC Note (Signed)
Overall Plan of Care [facility]) Patient Details Name: MAZIAH SMOLA MRN: [id number] DOB: January 27, 1935  Admitting Diagnosis: other  Hospital Problems: Active Problems:   * No active hospital problems. *     Functional Problem List: Nursing Behavior;Bladder;Bowel;Endurance;Medication Management;Motor;Pain;Perception;Safety;Skin Integrity;Sensory  PT Balance;Endurance;Motor;Pain;Safety;Sensory  OT Balance;Endurance;Motor;Pain;Safety;Skin Integrity;Sensory  SLP    TR         Basic ADL's: OT Eating;Grooming;Bathing;Dressing;Toileting     Advanced  ADL's: OT       Transfers: PT Bed Mobility;Bed to Chair;Car;Furniture  OT Toilet;Tub/Shower     Locomotion: PT Ambulation;Wheelchair Mobility;Stairs     Additional Impairments: OT Fuctional Use of Upper Extremity  SLP        TR      Anticipated Outcomes Item Anticipated Outcome  Self Feeding modified I  Swallowing      Basic self-care  min assist  Toileting  minimal assist   Bathroom Transfers minimal assit  Bowel/Bladder  mod assist  Transfers  Supervision  Locomotion  Supervision  Communication     Cognition     Pain  3 or less on scale 0-10  Safety/Judgment  min assist   Therapy Plan: PT Intensity: Minimum of 1-2 x/day ,45 to 90 minutes PT Frequency: 5 out of 7 days PT Duration Estimated Length of Stay: 21-28 days OT Intensity: Minimum of 1-2 x/day, 45 to 90 minutes OT Frequency: 5 out of 7 days OT Duration/Estimated Length of Stay: 3-4 weeks         Team Interventions: Nursing Interventions Patient/Family Education;Bladder Management;Bowel Management;Disease Management/Prevention;Pain Management;Medication Management;Skin Care/Wound Management;Discharge Planning;Psychosocial Support  PT interventions Ambulation/gait training;Balance/vestibular training;Community reintegration;Discharge planning;DME/adaptive equipment instruction;Functional electrical stimulation;Functional mobility  training;Neuromuscular re-education;Pain management;Patient/family education;Splinting/orthotics;Stair training;Therapeutic Activities;Therapeutic Exercise;UE/LE Strength taining/ROM;UE/LE Coordination activities;Wheelchair propulsion/positioning;Disease management/prevention;Psychosocial support  OT Interventions Therapeutic Exercise;Therapeutic Activities;Self Care/advanced ADL retraining;UE/LE Strength taining/ROM;Discharge planning;Functional mobility training;Patient/family education;DME/adaptive equipment instruction;Neuromuscular re-education;Balance/vestibular training;UE/LE Coordination activities  SLP Interventions    TR Interventions    SW/CM Interventions      Team Discharge Planning: Destination: PT-Home ,OT- Home , SLP-  Projected Follow-up: PT-Home health PT;24 hour supervision/assistance, OT-  24 hour supervision/assistance;Home health OT, SLP-  Projected Equipment Needs: PT- , OT- To be determined;Tub/shower bench;3 in 1 bedside comode, SLP-  Equipment Details: PT-To be determined, OT-Shower chair, tub bench, grab bars Patient/family involved in discharge planning: PT- Patient,  OT-Patient, SLP-   MD ELOS: 22-27 days Medical Rehab Prognosis:  Excellent Assessment: The patient has been admitted for CIR therapies. The team will be addressing, functional mobility, strength, stamina, balance, safety, adaptive techniques/equipment, self-care, bowel and bladder mgt, patient and caregiver education, NMR, pain mgt, bowel and bladder continence, spasticty/pain control, bracing/precautions. Goals have been set at min to mod assist for adl's and self-care and supervision for mobility.    Meredith Staggers, MD, FAAPMR      See Team Conference Notes for weekly updates to the plan of care

## 2013-08-02 NOTE — Progress Notes (Addendum)
Physical Therapy Session Note  Patient Details  Name: [name] MRN: [id number] Date of Birth: [birth year]  Today's Date: 08/02/2013 Time:  0930- 1030   60 min total first session  Short Term Goals: Week 1:  PT Short Term Goal 1 (Week 1): Pt will be able to complete transfers from bed<>chair with mod A.  PT Short Term Goal 2 (Week 1): Pt will be able to propel wheel chair 50 feet with min A.  PT Short Term Goal 3 (Week 1): Pt will be able to ambulate 10 feet using the LRAD with mod A.  Skilled Therapeutic Interventions/Progress Updates:    Pt reports no pain from "sliding forwards" out of chair with OT. Does report fatigue from OT session. Session focused on sit <> stand transitions and functional endurance. Repeated sit <> stands from recliner (x6 reps) with +2 assist pt varied contribution between 20%-~50%, did well with rocking trunk for momentum and anterior weight shift. Mirror provided for visual feedback. PT facilitation of glutes, Rt LE blocked due to bil LE weakness. Intermittent hyperextension bil knees noted. PT to appropriate place LEs and cues needed for pre-stand mechanics.   Second Session Time:  1130-1200 Time Calculation (min): 30 min Skilled Therapeutic Interventions/Progress Updates:  Session focused on functional transfers and bil LE strengthening. Scoot transfer from drop arm recliner to bed performed with +2 for safety, mod assist overall. Sit>supine with +2 assist (one person for LEs and second person for safety). Bil there-ex: bridging, manually resisted heel slides and hip/knee extension, SLR, hip abduction/adduction x 10 reps each. Pt positioned for comfort with pillows. Pt reports intermittent "shooting pain" in Rt UE however during most of session no c/o pain.   Therapy Documentation Precautions:  Precautions Precautions: Cervical;Fall Precaution Comments: Parasthesias Required Braces or Orthoses: Cervical Brace Cervical Brace: Hard collar;At all  times Restrictions Weight Bearing Restrictions: No Other Position/Activity Restrictions: no lifting, pushing, or heaving lifting  See FIM for current functional status  Therapy/Group: Individual Therapy both sessions  [name] 08/02/2013, 10:27 AM

## 2013-08-02 NOTE — Progress Notes (Signed)
Notified by OT to come to room, patient fell while trying to bathe at sink.  Upon entering, patient was lying in floor on left side.  Denies pain.  Says she didn't hit any particular place of her body.  Used maximove to get patient into recliner where patient is now finishing her bathing at the sink with OT.  VSS 159/96; HR-82 O2-95% T-97.6; RR-21.  Post fall protocol initiated.  Will continue to monitor patient closely.  Brita Romp, RN

## 2013-08-02 NOTE — Progress Notes (Signed)
Called patients son Yvone Neu to inform him that his mother fell with therapy this morning.  Yvone Neu was very understanding and recognizes the importance of continuing with therapy.  Brita Romp, RN

## 2013-08-02 NOTE — Progress Notes (Signed)
VASCULAR LAB PRELIMINARY  PRELIMINARY  PRELIMINARY  PRELIMINARY  Bilateral lower extremity venous Dopplers completed.    Preliminary report:  There is no obvious evidence of DVT or SVT noted in the bilateral lower extremities.  Yolinda Duerr, RVT 08/02/2013, 11:07 AM

## 2013-08-02 NOTE — Progress Notes (Signed)
Patient information reviewed and entered into eRehab system by Daiva Nakayama, RN, CRRN, [facility] Coordinator.  Information including medical coding and functional independence measure will be reviewed and updated through discharge.     Per nursing patient was given "Data Collection Information Summary for Patients in Inpatient Rehabilitation Facilities with attached "Privacy Act [place] Records" upon admission.  Pt is a readmission past a short stay on acute and had information already.

## 2013-08-02 NOTE — Progress Notes (Signed)
[place] PHYSICAL MEDICINE & REHABILITATION     PROGRESS NOTE    Subjective/Complaints: Had an anxiety attack with collar--felt she was getting choked. Admits to being claustrophobic A 12 point review of systems has been performed and if not noted above is otherwise negative.   Objective: Vital Signs: Blood pressure 175/78, pulse 83, temperature 98.2 F (36.8 C), temperature source Oral, resp. rate 16, SpO2 94.00%. No results found.  Recent Labs  08/02/13 0548  WBC 8.0  HGB 10.2*  HCT 31.6*  PLT 281    Recent Labs  08/02/13 0548  NA 139  K 4.2  CL 98  GLUCOSE 113*  BUN 10  CREATININE 0.44*  CALCIUM 8.9   CBG (last 3)  No results found for this basename: GLUCAP,  in the last 72 hours  Wt Readings from Last 3 Encounters:  07/30/13 111.131 kg (245 lb)  07/30/13 111.131 kg (245 lb)  07/20/13 79.561 kg (175 lb 6.4 oz)    Physical Exam:  Constitutional:  79 year old obese female, no distress. alert  HENT: oral mucosa pink and moist  Head: Normocephalic.  Eyes: EOM are normal.  Neck: Normal range of motion. Neck supple. No thyromegaly present.  Cardiovascular: Normal rate and regular rhythm. No murmurs, rubs, or gallops  Respiratory: Effort normal and breath sounds normal. No respiratory distress.  GI: Soft. Bowel sounds are normal. She exhibits no distension.  Neurological:  patient is alert and a bit anxious. She is oriented x3 followed basic commands. RUE   3 deltoid, bicep, triceps 2+, WE 3, HI 3-, LUE 3 to 3+ deltoid, bicep, tricep, wrist and HI. LE: RHF, KE and ankle 2+ to 3+/5. LLE 2/5 proximal to distal. Decreased PP and LT/proprioception all 4's, lower more than upper, right more than left. DTR's 1+  Skin: Surgical site clean and dry without swelling Psych: pleasant, cooperative, NON-anxious  Assessment/Plan: 1. Functional deficits secondary to cervical epidural abscess with myelopathy s/p C4-7 laminectomy and fusion C3-T1, which require 3+ hours per  day of interdisciplinary therapy in a comprehensive inpatient rehab setting. Physiatrist is providing close team supervision and 24 hour management of active medical problems listed below. Physiatrist and rehab team continue to assess barriers to discharge/monitor patient progress toward functional and medical goals. FIM: FIM - Bathing Bathing Steps Patient Completed: Chest;Right Arm;Left Arm;Abdomen Bathing: 2: Max-Patient completes 3-4 38f 10 parts or 25-49%  FIM - Upper Body Dressing/Undressing Upper body dressing/undressing steps patient completed: Thread/unthread right sleeve of pullover shirt/dresss;Thread/unthread left sleeve of pullover shirt/dress;Put head through opening of pull over shirt/dress Upper body dressing/undressing: 4: Min-Patient completed 75 plus % of tasks FIM - Lower Body Dressing/Undressing Lower body dressing/undressing: 1: Total-Patient completed less than 25% of tasks  FIM - Toileting Toileting: 1: Total-Patient completed zero steps, helper did all 3  FIM - Air cabin crew Transfers: 0-Activity did not occur  FIM - Control and instrumentation engineer Devices: Bed rails;Arm rests Bed/Chair Transfer: 1: Mechanical lift (sara lift)  FIM - Locomotion: Wheelchair Locomotion: Wheelchair: 0: Activity did not occur FIM - Locomotion: Ambulation Ambulation/Gait Assistance: Not tested (comment)  Comprehension Comprehension Mode: Auditory Comprehension: 5-Understands complex 90% of the time/Cues < 10% of the time  Expression Expression Mode: Verbal Expression: 5-Expresses basic needs/ideas: With no assist  Social Interaction Social Interaction: 5-Interacts appropriately 90% of the time - Needs monitoring or encouragement for participation or interaction.  Problem Solving Problem Solving: 5-Solves complex 90% of the time/cues < 10% of the time  Memory Memory: 5-Recognizes  or recalls 90% of the time/requires cueing < 10% of the  time  Medical Problem List and Plan:  1. Cervical epidural abscess with tetraplegia. Status post anterior cervical decompression discectomy multilevel C4-C7 07/16/2013 with subsequent cord compression undergoing cervical 4-7 posterior cervical fusion, C4-C7 cervical laminectomy and C3-T1 levels 07/28/2013  2. DVT Prophylaxis/Anticoagulation: SCDs. Add lovenox 30 q12. Check dopplers 3. Pain Management: Neurontin 100 mg 3 times a day, Hydrocodone and Robaxin as needed. Monitor with increased mobility  4.ID/microaerophilic streptococci. Intravenous ceftriaxone through 08/26/2013  5. Neuropsych: This patient is capable of making decisions on her own behalf.  -low dose valium prn for anxiety (limit as much as possible) --discussed with patient 6. Hypertension. Lisinopril 5 mg daily, Lasix 20 mg daily. Labs normal 7. Asthma. Continue Dulera puffs twice daily as well as Flonase/CPAP  8. Neurogenic bladder/bowel.  Foley replaced for retention  -no bm for days---needs sorbitol today--dulcolax in pm if no results 9. Obesity. History of gastric bypass  10. Insomnia. Trazodone 50 mg each bedtime  LOS (Days) 2 A FACE TO FACE EVALUATION WAS PERFORMED  [name] T 08/02/2013 8:34 AM

## 2013-08-03 ENCOUNTER — Inpatient Hospital Stay (HOSPITAL_COMMUNITY): Payer: Medicare Other | Admitting: Occupational Therapy

## 2013-08-03 ENCOUNTER — Inpatient Hospital Stay (HOSPITAL_COMMUNITY): Payer: PRIVATE HEALTH INSURANCE

## 2013-08-03 ENCOUNTER — Inpatient Hospital Stay (HOSPITAL_COMMUNITY): Payer: Medicare Other | Admitting: Physical Therapy

## 2013-08-03 DIAGNOSIS — G061 Intraspinal abscess and granuloma: Secondary | ICD-10-CM

## 2013-08-03 DIAGNOSIS — G8250 Quadriplegia, unspecified: Secondary | ICD-10-CM

## 2013-08-03 NOTE — Progress Notes (Signed)
Social Work Assessment and Plan  Patient Details  Name: [name] MRN: [id number] Date of Birth: December 24, 1934  Today's Date: 08/03/2013  Problem List:  Patient Active Problem List   Diagnosis Date Noted  . Abscess in epidural space of cervical spine 07/30/2013  . Epidural abscess (embolic) of spinal cord 82/50/5397  . Epidural abscess 07/22/2013  . Spinal epidural abscess 07/16/2013  . Acute respiratory failure 07/16/2013  . OSA on CPAP 07/16/2013   Past Medical History:  Past Medical History  Diagnosis Date  . Hypertension   . Asthma   . Cancer     Skin Cancer  . Sleep apnea   . COPD (chronic obstructive pulmonary disease)    Past Surgical History:  Past Surgical History  Procedure Laterality Date  . Gastric bypass    . Anterior cervical decomp/discectomy fusion N/A 07/16/2013    Procedure: ANTERIOR CERVICAL DECOMPRESSION/DISCECTOMY FUSION mutlipleLEVELS C4-7;  Surgeon: Erline Levine, MD;  Location: [facility];  Service: Neurosurgery;  Laterality: N/A;  . Posterior cervical fusion/foraminotomy N/A 07/28/2013    Procedure: Cervical four-seven  posterior cervical fusion;  Surgeon: Erline Levine, MD;  Location: [facility];  Service: Neurosurgery;  Laterality: N/A;   Social History:  reports that she has never smoked. She does not have any smokeless tobacco history on file. She reports that she does not drink alcohol. Her drug history is not on file.  Family / Support Systems Marital Status: Widow/Widower Patient Roles: Parent ChildrenGinger Organ [phone] cell Other Supports: Lacy Duverney [phone] - work   [phone] - cell Anticipated Caregiver: Sister can check in on patient.  May hire [facility] to assist after rehab vs. SNF. Ability/Limitations of Caregiver: Currently does not have 24 hr supervision in place.  Anticipate possible need for SNF after inpatient rehab stay. Caregiver Availability: Other (Comment) (Pt has some support, but is planning for SNF tx initially  before returning home.) Family Dynamics: Close with sons and siblings.  She has been independent and cared for her ill husband until he passed away.  She is familiar with rehab process since here with husband a year ago.  Social History Preferred language: English Religion: Non-Denominational Cultural Background: No issues Education: High School Read: Yes Write: Yes Employment Status: Retired Freight forwarder Issues: No issues Guardian/Conservator: None-according to MD pt is capable with making her own decisions while here   Abuse/Neglect Physical Abuse: Denies Verbal Abuse: Denies Sexual Abuse: Denies Exploitation of patient/patient's resources: Denies Self-Neglect: Denies  Emotional Status Pt's affect, behavior and adjustment status: Pt is motivated to improve and wants to be as independent as possible before she leaves here.  She realizes she has a ways to go.  She relies upon herself to get through and her family whom she is close with. Recent Psychosocial Issues: Other medical issues Pyschiatric History: No history. Pt is feeling much better and is not currently experiencing s/s of depression currently.  Will continue to monitor her coping an intervene, as needed.  Team to monitor also. Substance Abuse History: No issues  Patient / Family Perceptions, Expectations & Goals Pt/Family understanding of illness & functional limitations: Pt is able to explain her condition and son aware and both have spoken with MD regarding pt's condition and questions regarding her treatment plan.  Both pleased she is on the rehab unit since she is familiar with it and will get much needed therapy.   Premorbid pt/family roles/activities: Mother, Sister, Homeowner, Church member, etc Anticipated changes in roles/activities/participation: resume Pt/family expectations/goals:  Pt stated; "I know I am going to need more rehab when I leave here."  Son stated on pt's first admission to rehab; " We  will do what we can but work."  [employer]: Other (Comment) ([facility] used before) Premorbid Home Care/DME Agencies: None Transportation available at discharge: family Resource referrals recommended: Support group (specify)  Discharge Planning Living Arrangements: Alone Support Systems: Children;Church/faith community Type of Residence: Private residence Insurance Resources: Multimedia programmer (specify) ([place] Medicare) Financial Resources: Radio broadcast assistant Screen Referred: No Living Expenses: Own Money Management: Patient Does the patient have any problems obtaining your medications?: No Home Management: Self Patient/Family Preliminary Plans: Pt is planning to go for short term SNF after her rehab is done at [facility].  Pt would like to go to [facility]. Pt has been to [facility] before, but will only return if private room is available.  She would also consider [facility]. Barriers to Discharge: Family Support;Steps Social Work Anticipated Follow Up Needs: HH/OP;SNF;Support Group Expected length of stay: 15-23 days  Clinical Impression CSW met with pt to introduce self and role of CSW and to complete assessment.  Pt is familiar with CIR as she was here a week ago before having to go back to surgery.  Pt reports feeling much better now and is enjoying her therapy.  She feels she is healing and getting stronger, but recognizes that she may need to go for more rehab at a SNF after CIR.  Pt shared her preference of SNFs with CSW.  CSW assured her that she would be involved in the decision, along with her family.  CSW knows pt's son, Yvone Neu, from his work with acute rehab as a PT.  CSW has a Building surveyor of private duty agencies to give to [place] when he visits, per his request.  CSW will work with family, pt, and therapy team to determine best d/c disposition for pt.  CSW will continue to follow pt and assist as needed.  Henessy Rohrer,  [name] 08/03/2013, 1:50 PM

## 2013-08-03 NOTE — Progress Notes (Signed)
[place] PHYSICAL MEDICINE & REHABILITATION     PROGRESS NOTE    Subjective/Complaints: Slept much better night. Anxiety down A 12 point review of systems has been performed and if not noted above is otherwise negative.   Objective: Vital Signs: Blood pressure 112/71, pulse 79, temperature 97.6 F (36.4 C), temperature source Oral, resp. rate 19, SpO2 91.00%. No results found.  Recent Labs  08/02/13 0548  WBC 8.0  HGB 10.2*  HCT 31.6*  PLT 281    Recent Labs  08/02/13 0548  NA 139  K 4.2  CL 98  GLUCOSE 113*  BUN 10  CREATININE 0.44*  CALCIUM 8.9   CBG (last 3)  No results found for this basename: GLUCAP,  in the last 72 hours  Wt Readings from Last 3 Encounters:  07/30/13 111.131 kg (245 lb)  07/30/13 111.131 kg (245 lb)  07/20/13 79.561 kg (175 lb 6.4 oz)    Physical Exam:  Constitutional:  79 year old obese female, no distress. alert  HENT: oral mucosa pink and moist  Head: Normocephalic.  Eyes: EOM are normal.  Neck: Normal range of motion. Neck supple. No thyromegaly present.  Cardiovascular: Normal rate and regular rhythm. No murmurs, rubs, or gallops  Respiratory: Effort normal and breath sounds normal. No respiratory distress.  GI: Soft. Bowel sounds are normal. She exhibits no distension.  Neurological:  patient is alert and a bit anxious. She is oriented x3 followed basic commands. RUE   3 deltoid, bicep, triceps 2+, WE 3, HI 3-, LUE 3 to 3+ deltoid, bicep, tricep, wrist and HI. LE: RHF, KE and ankle 2+ to 3+/5. LLE 2/5 proximal to distal. Decreased PP and LT/proprioception all 4's, lower more than upper, right more than left. DTR's 1+  Skin: Surgical site clean and dry without swelling Psych: pleasant, cooperative, NON-anxious today  Assessment/Plan: 1. Functional deficits secondary to cervical epidural abscess with myelopathy s/p C4-7 laminectomy and fusion C3-T1, which require 3+ hours per day of interdisciplinary therapy in a comprehensive  inpatient rehab setting. Physiatrist is providing close team supervision and 24 hour management of active medical problems listed below. Physiatrist and rehab team continue to assess barriers to discharge/monitor patient progress toward functional and medical goals. FIM: FIM - Bathing Bathing Steps Patient Completed: Chest;Right Arm;Left Arm;Abdomen Bathing: 2: Max-Patient completes 3-4 72f 10 parts or 25-49%  FIM - Upper Body Dressing/Undressing Upper body dressing/undressing steps patient completed: Thread/unthread right sleeve of pullover shirt/dresss;Thread/unthread left sleeve of pullover shirt/dress;Put head through opening of pull over shirt/dress Upper body dressing/undressing: 0: Wears gown/pajamas-no public clothing FIM - Lower Body Dressing/Undressing Lower body dressing/undressing: 0: Activity did not occur  FIM - Toileting Toileting: 1: Total-Patient completed zero steps, helper did all 3  FIM - Air cabin crew Transfers: 0-Activity did not occur  FIM - Control and instrumentation engineer Devices: HOB elevated;Bed rails;Arm rests Bed/Chair Transfer: 1: Two helpers  FIM - Locomotion: Wheelchair Locomotion: Wheelchair: 0: Activity did not occur FIM - Locomotion: Ambulation Ambulation/Gait Assistance: Not tested (comment)  Comprehension Comprehension Mode: Auditory Comprehension: 4-Understands basic 75 - 89% of the time/requires cueing 10 - 24% of the time  Expression Expression Mode: Verbal Expression: 4-Expresses basic 75 - 89% of the time/requires cueing 10 - 24% of the time. Needs helper to occlude trach/needs to repeat words.  Social Interaction Social Interaction: 5-Interacts appropriately 90% of the time - Needs monitoring or encouragement for participation or interaction.  Problem Solving Problem Solving: 3-Solves basic 50 - 74% of the time/requires  cueing 25 - 49% of the time  Memory Memory: 4-Recognizes or recalls 75 - 89% of the  time/requires cueing 10 - 24% of the time  Medical Problem List and Plan:  1. Cervical epidural abscess with tetraplegia. Status post anterior cervical decompression discectomy multilevel C4-C7 07/16/2013 with subsequent cord compression undergoing cervical 4-7 posterior cervical fusion, C4-C7 cervical laminectomy and C3-T1 levels 07/28/2013  2. DVT Prophylaxis/Anticoagulation: SCDs.   lovenox 30 q12. Dopplers negative 3. Pain Management: Neurontin 100 mg 3 times a day, Hydrocodone and Robaxin as needed. Monitor with increased mobility  4.ID/microaerophilic streptococci. Intravenous ceftriaxone through 08/26/2013  5. Neuropsych: This patient is capable of making decisions on her own behalf.  -low dose valium prn for anxiety (limit as much as possible) --discussed with patient 6. Hypertension. Lisinopril 5 mg daily, Lasix 20 mg daily. Labs normal 7. Asthma. Continue Dulera puffs twice daily as well as Flonase/CPAP  8. Neurogenic bladder/bowel.  Foley replaced for retention--dc again soon  -had bm yesterday 9. Obesity. History of gastric bypass  10. Insomnia. Trazodone 50 mg each bedtime  LOS (Days) 3 A FACE TO FACE EVALUATION WAS PERFORMED  SWARTZ,ZACHARY T 08/03/2013 8:42 AM

## 2013-08-03 NOTE — Progress Notes (Signed)
Occupational Therapy Session Note  Patient Details  Name: LELIANA KONTZ MRN: [id number] Date of Birth: 08/14/1934  Today's Date: 08/03/2013 Time: 0830-0930 Time Calculation (min): 60 min  Short Term Goals: Week 1:  OT Short Term Goal 1 (Week 1): Patient will complete lower body dressing with mod assist. OT Short Term Goal 2 (Week 1): Patient will complete toileting with min assist for thoroughness with toilet hygiene OT Short Term Goal 3 (Week 1): Patient will perform tranfer to tub bench with mod assist OT Short Term Goal 4 (Week 1): Patient will demo improved [place] of bil hands as evidenced by ability open containers independently during self-feeding  Skilled Therapeutic Interventions/Progress Updates: ADL-retraining with focus on bed mobility, static and dynamic sitting balance, BUE function, safety awareness, endurance, self-feeding skills.   Patient received supine in bed, reporting improved sleep although continued discomfort from cervical collar.   Patient completed supine bathing and supine lower body dressing with mod assist to initiate bed mobility troward right sidelying, min assist toward left with hand guidance to place right hand on bed rail.   Patient now able to move both legs to edge of bed when rising from supine to sitting at edge of bed but required max assist to lift upper body.    Patient maintained supported static sitting balance using bed rail and required mod assist to don shirt.   From edge of bed, mech lift used to place patient in w/c for self-feeding with setup assist.   Patient used left hand with large grip silverware with min assist to apply makeshift bib for occasional food spills.  Session ended with patient left in room with RN dispensing meds.  Therapy Documentation Precautions:  Precautions Precautions: Cervical;Fall Precaution Comments: Parasthesias Required Braces or Orthoses: Cervical Brace Cervical Brace: Hard collar;At all times Restrictions Weight  Bearing Restrictions: No Other Position/Activity Restrictions: no lifting, pushing, or heaving lifting   Pain: Pain Assessment Pain Score: 6   See FIM for current functional status  Therapy/Group: Individual Therapy  Second session: Time: 1300-1330 Time Calculation (min):  30 min  Pain Assessment: No pain  Skilled Therapeutic Interventions: ADL-retraining with focus on bed mobility, improved sit><stand at mech lift Denna Haggard), weight-shifting and dynamic sitting balance.   Patient received supine in bed, unaware of food tray at bedside table.   Patient receptive to transfer to w/c after mod assist with bed mobility and min assist to transfer using mech lift.  Patient required only setup assist to open condiments but peeled her banana independently this session, with extra time provided.   Patient's son Yvone Neu arrived near end of session and was advised on his mother's progress in therapies.  See FIM for current functional status  Therapy/Group: Individual Therapy  [place] 08/03/2013, 12:29 PM

## 2013-08-03 NOTE — Progress Notes (Signed)
Occupational Therapy Session Note  Patient Details  Name: [name] MRN: [id number] Date of Birth: 22-May-1934  Today's Date: 08/03/2013 Time: 7903-8333 Time Calculation (min): 32 min  Skilled Therapeutic Interventions/Progress Updates:    Pt seen for PM OT session sitting in wheelchair.  Performed UE exercises for the RUE during session.  Pt performed 1 set of shoulder flexion to 90 degrees with min assist for 10 repetitions.  Performed right elbow flexion for 2 sets holding 2 lb weight.  Pt also performed wrist extension and wrist flexion for 2 sets of 10 repetitions.  She needed min assist for wrist extension and mod facilitation for wrist flexion.  Finished session by having her place her right hand on a wash cloth on top of a slanted board and working on shoulder flexion.  Pt needed only min guard to complete 2 sets of 10 repetitions during this task.  Pt left in wheelchair with call button in reach.      Therapy Documentation Precautions:  Precautions Precautions: Cervical;Fall Precaution Comments: Parasthesias Required Braces or Orthoses: Cervical Brace Cervical Brace: Hard collar;At all times Restrictions Weight Bearing Restrictions: No Other Position/Activity Restrictions: no lifting, pushing, or heaving lifting  Pain: Pain Assessment Pain Assessment: No/denies pain Pain Score: 4  ADL:  See FIM for current functional status  Therapy/Group: Individual Therapy  Michaeljames Milnes OTR/L 08/03/2013, 4:40 PM

## 2013-08-03 NOTE — Progress Notes (Signed)
Inpatient [place] Individual Statement of Services  Patient Name:  [name]  Date:  08/03/2013  Welcome to the [facility].  Our goal is to provide you with an individualized program based on your diagnosis and situation, designed to meet your specific needs.  With this comprehensive rehabilitation program, you will be expected to participate in at least 3 hours of rehabilitation therapies Monday-Friday, with modified therapy programming on the weekends.  Your rehabilitation program will include the following services:  Physical Therapy (PT), Occupational Therapy (OT), 24 hour per day rehabilitation nursing, Therapeutic Recreation (TR), Neuropsychology, Case Management (Social Worker), Rehabilitation Medicine, Nutrition Services and Pharmacy Services  Weekly team conferences will be held on Tuesdays to discuss your progress.  Your Social Worker will talk with you frequently to get your input and to update you on team discussions.  Team conferences with you and your family in attendance may also be held.  Expected length of stay: 3 to 4 weeks  Overall anticipated outcome:  Minimal Assistance  Depending on your progress and recovery, your program may change. Your Social Worker will coordinate services and will keep you informed of any changes. Your Social Worker's name and contact numbers are listed  below.  The following services may also be recommended but are not provided by the [place] will be made to provide these services after discharge if needed.  Arrangements include referral to agencies that provide these services.  Your insurance has been verified to be:  [place] Medicare Your primary doctor is:    Pertinent information will be shared with your doctor and your insurance company.  Social Worker:   Alfonse Alpers, LCSW  [phone] or [phone]  Information discussed with and copy given to patient by: Trey Sailors, 08/03/2013, 2:29 PM

## 2013-08-03 NOTE — Progress Notes (Signed)
Physical Therapy Session Note  Patient Details  Name: [name] MRN: [id number] Date of Birth: 1935-01-17  Today's Date: 08/03/2013 Time: 0930-1030 Time Calculation (min): 60 min  Short Term Goals: Week 1:  PT Short Term Goal 1 (Week 1): Pt will be able to complete transfers from bed<>chair with mod A.  PT Short Term Goal 2 (Week 1): Pt will be able to propel wheel chair 25' feet with mod A.  PT Short Term Goal 3 (Week 1): Pt will be able to ambulate 5 feet using the LRAD with +2 total assist First session Skilled Therapeutic Interventions/Progress Updates:  Pt seated in w/c reports fatigue but willing to work. W/c<>mat transfer with Clarise Cruz and +2 for safety. Sit <> stands from [place] 2 x 10 reps with min assist cues for anterior weight shifting. Progressed to sit <> stands from regular height mat performed with mod assist! Lateral leans to Rt/Lt for core strengthening, mod assist. Sit <> stands from w/c performed with mod/max assist and +2 for safety, RW in front for stability. Attempted gait with RW and +2 (one person on each side, Rt knee blocked) and additional person to keep w/c directly behind pt, able to take 2 steps with overall max assist. Increased difficulty progressing Lt LE on weak Rt leg, struggles with bil hyperextension Rt>Lt. Impaired ability to utilize UEs for support due to significant weakness. Returned to bed at end of session per pt request, sit>supine +2 assist for trunk and bil LE assist.   Second session Skilled Therapeutic Interventions/Progress Updates:  Time: 1130-1200 Time Calculation (min): 30 min   Pt reports lots of fatigue, willing to get out of bed however PT and pt decided to do bed exercises and allow longer rest to promote more OOB time after lunch. Bil LE exercises: bridging, heel slides (manually resisted), straight leg raises, hip ab/adduction, ankle pumps (over pressure on Rt for heel stretch) x 10 reps each intermittent active assistive motion with Rt  LE due to weakness particularly with Rt hip flexors. Passive flexion/abduction/internal/external rotation bil hips, bil hamstrings. Rt LE musculature tighter than Rt.   Therapy Documentation Precautions:  Precautions Precautions: Cervical;Fall Precaution Comments: Parasthesias Required Braces or Orthoses: Cervical Brace Cervical Brace: Hard collar;At all times Restrictions Weight Bearing Restrictions: No Other Position/Activity Restrictions: no lifting, pushing, or heaving lifting Pain: No c/o during either session  See FIM for current functional status  Therapy/Group: Individual Therapy both sessions  [name] 08/03/2013, 12:15 PM

## 2013-08-04 ENCOUNTER — Inpatient Hospital Stay (HOSPITAL_COMMUNITY): Payer: PRIVATE HEALTH INSURANCE | Admitting: Physical Therapy

## 2013-08-04 ENCOUNTER — Inpatient Hospital Stay (HOSPITAL_COMMUNITY): Payer: Medicare Other | Admitting: Occupational Therapy

## 2013-08-04 ENCOUNTER — Inpatient Hospital Stay (HOSPITAL_COMMUNITY): Payer: PRIVATE HEALTH INSURANCE | Admitting: Occupational Therapy

## 2013-08-04 DIAGNOSIS — G061 Intraspinal abscess and granuloma: Secondary | ICD-10-CM

## 2013-08-04 DIAGNOSIS — G8250 Quadriplegia, unspecified: Secondary | ICD-10-CM

## 2013-08-04 LAB — URINE CULTURE

## 2013-08-04 LAB — GLUCOSE, CAPILLARY: GLUCOSE-CAPILLARY: 114 mg/dL — AB (ref 70–99)

## 2013-08-04 NOTE — Significant Event (Signed)
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

## 2013-08-04 NOTE — Progress Notes (Signed)
[facility] PHYSICAL MEDICINE & REHABILITATION     PROGRESS NOTE    Subjective/Complaints: Had a good night. Doesn't like cpap on while she's got collar on. A 12 point review of systems has been performed and if not noted above is otherwise negative.   Objective: Vital Signs: Blood pressure 140/69, pulse 73, temperature 98 F (36.7 C), temperature source Oral, resp. rate 19, SpO2 95.00%. No results found.  Recent Labs  08/02/13 0548  WBC 8.0  HGB 10.2*  HCT 31.6*  PLT 281    Recent Labs  08/02/13 0548  NA 139  K 4.2  CL 98  GLUCOSE 113*  BUN 10  CREATININE 0.44*  CALCIUM 8.9   CBG (last 3)  No results found for this basename: GLUCAP,  in the last 72 hours  Wt Readings from Last 3 Encounters:  07/30/13 111.131 kg (245 lb)  07/30/13 111.131 kg (245 lb)  07/20/13 79.561 kg (175 lb 6.4 oz)    Physical Exam:  Constitutional:  79 year old obese female, no distress. alert  HENT: oral mucosa pink and moist  Head: Normocephalic.  Eyes: EOM are normal.  Neck: Normal range of motion. Neck supple. No thyromegaly present.  Cardiovascular: Normal rate and regular rhythm. No murmurs, rubs, or gallops  Respiratory: Effort normal and breath sounds normal. No respiratory distress.  GI: Soft. Bowel sounds are normal. She exhibits no distension.  Neurological:  patient is alert and a bit anxious. She is oriented x3 followed basic commands. RUE   3 deltoid, bicep, triceps 2+, WE 3, HI 3-, LUE 3 to 3+ deltoid, bicep, tricep, wrist and HI. LE: RHF, KE and ankle 2+ to 3+/5. LLE 2/5 proximal to distal. Decreased PP and LT/proprioception all 4's, lower more than upper, right more than left. DTR's 1+  Skin: Surgical site clean and dry without swelling Psych: pleasant, cooperative, NON-anxious today  Assessment/Plan: 1. Functional deficits secondary to cervical epidural abscess with myelopathy s/p C4-7 laminectomy and fusion C3-T1, which require 3+ hours per day of interdisciplinary  therapy in a comprehensive inpatient rehab setting. Physiatrist is providing close team supervision and 24 hour management of active medical problems listed below. Physiatrist and rehab team continue to assess barriers to discharge/monitor patient progress toward functional and medical goals. FIM: FIM - Bathing Bathing Steps Patient Completed: Chest;Abdomen;Right upper leg;Left upper leg;Right Arm Bathing: 2: Max-Patient completes 3-4 67f 10 parts or 25-49%  FIM - Upper Body Dressing/Undressing Upper body dressing/undressing steps patient completed: Thread/unthread left sleeve of pullover shirt/dress Upper body dressing/undressing: 2: Max-Patient completed 25-49% of tasks FIM - Lower Body Dressing/Undressing Lower body dressing/undressing: 1: Total-Patient completed less than 25% of tasks  FIM - Toileting Toileting: 1: Total-Patient completed zero steps, helper did all 3  FIM - Air cabin crew Transfers: 0-Activity did not occur  FIM - Control and instrumentation engineer Devices: HOB elevated;Bed rails Bed/Chair Transfer: 1: Two helpers  FIM - Locomotion: Wheelchair Locomotion: Wheelchair: 0: Activity did not occur FIM - Locomotion: Ambulation Locomotion: Ambulation Assistive Devices: Administrator Ambulation/Gait Assistance: 1: +2 Total assist (+3) Locomotion: Ambulation: 1: Two helpers  Comprehension Comprehension Mode: Auditory Comprehension: 5-Follows basic conversation/direction: With no assist  Expression Expression Mode: Verbal Expression: 5-Expresses basic needs/ideas: With extra time/assistive device  Social Interaction Social Interaction: 7-Interacts appropriately with others - No medications needed.  Problem Solving Problem Solving: 4-Solves basic 75 - 89% of the time/requires cueing 10 - 24% of the time  Memory Memory: 6-More than reasonable amt of time  Medical Problem List and Plan:  1. Cervical epidural abscess with tetraplegia.  Status post anterior cervical decompression discectomy multilevel C4-C7 07/16/2013 with subsequent cord compression undergoing cervical 4-7 posterior cervical fusion, C4-C7 cervical laminectomy and C3-T1 levels 07/28/2013  2. DVT Prophylaxis/Anticoagulation: SCDs.   lovenox 30 q12. Dopplers negative 3. Pain Management: Neurontin 100 mg 3 times a day, Hydrocodone and Robaxin as needed. Monitor with increased mobility  4.ID/microaerophilic streptococci. Intravenous ceftriaxone through 08/26/2013  5. Neuropsych: This patient is capable of making decisions on her own behalf.  -low dose valium prn for anxiety (limit as much as possible) --discussed with patient 6. Hypertension. Lisinopril 5 mg daily, Lasix 20 mg daily. Labs normal 7. Asthma. Continue Dulera puffs twice daily as well as Flonase/CPAP  8. Neurogenic bladder/bowel.  Foley replaced for retention   -moving bowels  -may remove foley tomorrow 9. Obesity. History of gastric bypass  10. Insomnia. Trazodone 50 mg each bedtime  LOS (Days) 4 A FACE TO FACE EVALUATION WAS PERFORMED  Muzamil Harker T 08/04/2013 8:47 AM

## 2013-08-04 NOTE — Progress Notes (Signed)
Occupational Therapy Session Note  Patient Details  Name: [name] MRN: [id number] Date of Birth: 04-Apr-1935  Today's Date: 08/04/2013 Time: 1345-1430 Time Calculation (min): 45 min  Short Term Goals: Week 1:  OT Short Term Goal 1 (Week 1): Patient will complete lower body dressing with mod assist. OT Short Term Goal 2 (Week 1): Patient will complete toileting with min assist for thoroughness with toilet hygiene OT Short Term Goal 3 (Week 1): Patient will perform tranfer to tub bench with mod assist OT Short Term Goal 4 (Week 1): Patient will demo improved [place] of bil hands as evidenced by ability open containers independently during self-feeding  Skilled Therapeutic Interventions/Progress Updates:  Upon entering room, patient found supine in bed with no complaints of direct pain. Therapist set-up w/c and patient sat EOB with moderate assistance. From here, therapist assisted patient with EOB>w/c transfer using STEDY lift. Patient able to stand with minimal assistance. Patient sat in w/c and re-positioned self with min verbal cues from therapist. From here, patient worked on w/c propulsion/maneuvering > therapy gym, patient required minimal assistance for this task. While in gym, patient engaged in 9hole peg tests > left hand =52 seconds, > right hand = 3 minutes 56 seconds (average for left=24.11 seconds, average for right=22.49). Plan to follow up closer to discharge and re-test 9hole peg test. 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 Therapist assisted patient back to room, positioned patient comfortably in chair, donned quick release belt for safety, and left patient with all needed items within reach. Patient wanting to get back to bed, but therapist encouraged patient to stay in w/c until next therapy session.  Precautions:  Precautions Precautions: Cervical;Fall Precaution Comments: Parasthesias Required Braces or Orthoses:  Cervical Brace Cervical Brace: Hard collar;At all times Restrictions Weight Bearing Restrictions: No Other Position/Activity Restrictions: no lifting, pushing, or heaving lifting  See FIM for current functional status  Therapy/Group: Individual Therapy  Eddi Hymes 08/04/2013, 2:43 PM

## 2013-08-04 NOTE — Progress Notes (Signed)
Occupational Therapy Session Note  Patient Details  Name: [name] MRN: [id number] Date of Birth: [birth year]  Today's Date: 08/04/2013 Time: 1000-1100 Time Calculation (min): 60 min  Short Term Goals: Week 1:  OT Short Term Goal 1 (Week 1): Patient will complete lower body dressing with mod assist. OT Short Term Goal 2 (Week 1): Patient will complete toileting with min assist for thoroughness with toilet hygiene OT Short Term Goal 3 (Week 1): Patient will perform tranfer to tub bench with mod assist OT Short Term Goal 4 (Week 1): Patient will demo improved [facility] of bil hands as evidenced by ability open containers independently during self-feeding  Skilled Therapeutic Interventions/Progress Updates:  Patient resting in bed upon arrival with sister at her side.  Engaged in self care retraining to include sponge bath, dress, and groom tasks with LB performed at bed level and UB performed at the sink.  Patient assisted with rolling left and right using the bedrail and patient assisted with drying her bottom while lying on her side.  Patient was min assist for sit>stand on Denna Haggard and to address trunk control and BUE functional use while completing UB bath, dress and grooming.  At end of session, assist patient with shampoo shower cap.  Therapy Documentation Precautions:  Precautions Precautions: Cervical;Fall Precaution Comments: Parasthesias Required Braces or Orthoses: Cervical Brace Cervical Brace: Hard collar;At all times Restrictions Weight Bearing Restrictions: No Other Position/Activity Restrictions: no lifting, pushing, or heaving lifting Pain: 3/10 upper back, rest and repositioned. ADL: See FIM for current functional status  Therapy/Group: Individual Therapy  Sulma Ruffino, [place] 08/04/2013, 12:51 PM

## 2013-08-04 NOTE — Progress Notes (Signed)
Orthopedic Tech Progress Note Patient Details:  [name] 1934/06/15 [id number] Completed by Advanced Patient ID: [name], female   DOB: Oct 02, 1934, 79 y.o.   MRN: [id number]   [name] 08/04/2013, 3:09 PM

## 2013-08-04 NOTE — Progress Notes (Signed)
Physical Therapy Note  Patient Details  Name: BERNIECE ABID MRN: [id number] Date of Birth: Mar 26, 1935 Today's Date: 08/04/2013  Time: 1100-1143 43 minutes  1:1 No c/o pain, pt c/o fatigue from previous OT session.  Sit to stand to stedy multiple attempts throughout session with pt requiring max A to stand while holding stedy.  Pt requires manual facilitation for foot placement on R for sit to stand.  Sit to stands in stedy with focus on eccentric control with min facilitation at R knee.  Standing reaching task in stedy with focus on trunk and and hip extension, tactile cues at glutes.  Pt responds well to tactile cues and is able to maintain upright trunk and hip extension 5-10 seconds at a time before fatigue.  Bed mobility with +2 assist sit to supine and to scoot to head of bed due to fatigue. Pt with good motivaiton, limited by fatigue after back to back therapy sessions.   Ranferi Clingan 08/04/2013, 11:44 AM

## 2013-08-04 NOTE — Progress Notes (Signed)
Physical Therapy Session Note  Patient Details  Name: [name] MRN: [id number] Date of Birth: 1934-09-07  Today's Date: 08/04/2013 Time: 1510-1540 Time Calculation (min): 30 min  Short Term Goals: Week 1:  PT Short Term Goal 1 (Week 1): Pt will be able to complete transfers from bed<>chair with mod A.  PT Short Term Goal 2 (Week 1): Pt will be able to propel wheel chair 25' feet with mod A.  PT Short Term Goal 3 (Week 1): Pt will be able to ambulate 5 feet using the LRAD with +2 total assist  Skilled Therapeutic Interventions/Progress Updates:   Pt received supine/R sidelying in bed with pillows placed for left sided posterior pressure relief. Pt reports she could not tolerate sitting up to wait for therapy session due to buttocks pain from sitting up in chair. Pt agreeable to therapy at bedside. Pt transferred sit <> supine from flat bed with +2 A  for trunk and BLEs using log roll technique. Seated edge of bed, pt performed sit <> stand x 3 using Denna Haggard with CG-minA and standing trials of 1.5 min to 1 min. With verbal cuing to engage glutes and stand tall, pt able to maintain upright trunk posture for up to 30 sec at a time before fatiguing. Pt performed lateral weight shifts to L and R in standing with mod A to adjust B feet and to increase WB through R LE as pt is unable to maintain midline and WB mostly through L side. Pt returned to supine/R sidelying in bed with pillows positioned for pressure relief and all needs met.   Therapy Documentation Precautions:  Precautions Precautions: Cervical;Fall Precaution Comments: Parasthesias Required Braces or Orthoses: Cervical Brace Cervical Brace: Hard collar;At all times Restrictions Weight Bearing Restrictions: No Other Position/Activity Restrictions: no lifting, pushing, or heaving lifting Pain: Pain Assessment Pain Assessment: 0-10 Pain Score: 6  Pain Type: Acute pain Pain Location: Buttocks Pain Orientation:  Left;Right Pain Descriptors / Indicators: Pressure;Discomfort Pain Onset: On-going Pain Intervention(s): Repositioned  See FIM for current functional status  Therapy/Group: Individual Therapy  Laretta Alstrom 08/04/2013, 4:38 PM

## 2013-08-05 ENCOUNTER — Inpatient Hospital Stay (HOSPITAL_COMMUNITY): Payer: Medicare Other | Admitting: Physical Therapy

## 2013-08-05 ENCOUNTER — Inpatient Hospital Stay (HOSPITAL_COMMUNITY): Payer: PRIVATE HEALTH INSURANCE

## 2013-08-05 NOTE — Progress Notes (Signed)
Physical Therapy Session Note  Patient Details  Name: [name] MRN: [id number] Date of Birth: February 12, 1935  Today's Date: 08/05/2013 Time: 1000-1100 Time Calculation (min): 60 min  Short Term Goals: Week 1:  PT Short Term Goal 1 (Week 1): Pt will be able to complete transfers from bed<>chair with mod A.  PT Short Term Goal 2 (Week 1): Pt will be able to propel wheel chair 25' feet with mod A.  PT Short Term Goal 3 (Week 1): Pt will be able to ambulate 5 feet using the LRAD with +2 total assist  First Session Skilled Therapeutic Interventions/Progress Updates:    Session focused on transfer training and sit <> stands. Lateral scooting along mat with mod assist cues for head/hips relationship and anterior weight shift. Scoot lateral transfers (level) w/c to/from mat performed with mod assist and w/c stabilization. Sit <> stands from mat performed with mod assist progressing to min-guard assist. Cues for decreasing bil LE hyperextension. Pt returned to room for bowel movement - utilized sara-steady in bathroom overall min assist with exception of max assist for sit>stand from low toilet. Nursing present to assist with impaction. Pt returned to bed using sara-steady, sit>supine +2 assist for truncal and bil LE assist.   Second Session Time:  1335-1420 Time Calculation (min): 45 min Skilled Therapeutic Interventions/Progress Updates:  Session focused on gait with MaxiSky for safety. Sit <> stands from w/c performed with overall min assist however due to poor bil LE proprioception pt immediately demos significant tibial advancement resulting in forward lean requiring mod/max assist for balance with RW.   Ambulation x total of ~5' with RW and MaxiSky for support. +2 for safety. Pt able to take approximately 3-4 steps at a time then required seated rest in sling due to bil LE weakness and "sinking" into knee/hip flexion. Significant ataxia and poor proprioception of bil LEs Rt>Lt. Mod/max assist  from therapist for appropriate advancement of Rt LE to prevent severe adduction. Pt will likely benefit from visual feedback next trial.   Therapy Documentation Precautions:  Precautions Precautions: Cervical;Fall Precaution Comments: Parasthesias Required Braces or Orthoses: Cervical Brace Cervical Brace: Hard collar;At all times Restrictions Weight Bearing Restrictions: No Other Position/Activity Restrictions: no lifting, pushing, or heaving lifting Pain: Pain Assessment Pain Score: 0-No pain either session  See FIM for current functional status  Therapy/Group: Individual Therapy both sessions  [name] 08/05/2013, 12:32 PM

## 2013-08-05 NOTE — Progress Notes (Signed)
Occupational Therapy Session Note  Patient Details  Name: [name] MRN: [id number] Date of Birth: 1935-03-03  Today's Date: 08/05/2013 Time: 0830-0930 Time Calculation (min): 60 min  Short Term Goals: Week 1:  OT Short Term Goal 1 (Week 1): Patient will complete lower body dressing with mod assist. OT Short Term Goal 2 (Week 1): Patient will complete toileting with min assist for thoroughness with toilet hygiene OT Short Term Goal 3 (Week 1): Patient will perform tranfer to tub bench with mod assist OT Short Term Goal 4 (Week 1): Patient will demo improved [place] of bil hands as evidenced by ability open containers independently during self-feeding  Skilled Therapeutic Interventions/Progress Updates:  ADL-retraining with focus on bed mobility, improved BUE function, and adapted bathing/dressing skills.  Patient performed supine bathing in bed with improved participation using LH sponge for upper legs this session.   Patient now able to tolerate and perform bed mobility, rolling both right and left, with improved LE control and manual facilitation to bend knees and hips, prn, while receiving assist with donning diaper and pants.   Patient able to sit at edge of bed unsupported to don her shirt this session but was unable to pull head through hole d/t cervical collar.   Patient has progressed with use of mech lift, standing unassisted at [facility] this session.   Patient left at sink in w/c to perform grooming (brushing her teeth) at end of session; call light and phone within reach, safety belt secured.    Therapy Documentation Precautions:  Precautions Precautions: Cervical;Fall Precaution Comments: Parasthesias Required Braces or Orthoses: Cervical Brace Cervical Brace: Hard collar;At all times Restrictions Weight Bearing Restrictions: No Other Position/Activity Restrictions: no lifting, pushing, or heaving lifting  Vital Signs: Therapy Vitals Temp: 97.8 F (36.6 C) Temp src:  Oral Pulse Rate: 77 Resp: 16 BP: 157/85 mmHg Oxygen Therapy SpO2: 92 % O2 Device: None (Room air)  Pain: No pain    See FIM for current functional status  Therapy/Group: Individual Therapy  Second session: Time: 1300-1330 Time Calculation (min):  30 min  Pain Assessment: No pain  Skilled Therapeutic Interventions: ADL-retraining with focus on lower body dressing, bed mobility, and dynamic sitting balance.  Patient received supine in bed with noon meal.  Patient reported having had assist with toileting and hygiene (BM) in bed resulting in soiling her pants.  Pt requested assist with donning new pants.   Patient performed bed mobility, rolling right and left, to aid with assisted lower body dressing with min-mod assist for trunk rotation and manual facilitation to reach and place right hand on bed rail.   Patient demo'd improved performance from supine to sitting at edge of bed (pt =60%) and was able to scoot to edge of bed unassisted, advancing with alternating pelvic lifts.  From edge of bed, patient stood at [facility], unassisted, and transferred to w/c using mech lift.   Patient left in w/c, setup for continued self-feeding, with friend Dub Mikes in room and call light and phone placed within reach.   See FIM for current functional status  Therapy/Group: Individual Therapy  Karlo Goeden 08/05/2013, 10:09 AM

## 2013-08-05 NOTE — Progress Notes (Signed)
[place] PHYSICAL MEDICINE & REHABILITATION     PROGRESS NOTE    Subjective/Complaints: No new issues A 12 point review of systems has been performed and if not noted above is otherwise negative.   Objective: Vital Signs: Blood pressure 157/85, pulse 77, temperature 97.8 F (36.6 C), temperature source Oral, resp. rate 16, SpO2 92.00%. No results found. No results found for this basename: WBC, HGB, HCT, PLT,  in the last 72 hours No results found for this basename: NA, K, CL, CO, GLUCOSE, BUN, CREATININE, CALCIUM,  in the last 72 hours CBG (last 3)   Recent Labs  08/04/13 1138  GLUCAP 114*    Wt Readings from Last 3 Encounters:  07/30/13 111.131 kg (245 lb)  07/30/13 111.131 kg (245 lb)  07/20/13 79.561 kg (175 lb 6.4 oz)    Physical Exam:  Constitutional:  79 year old obese female, no distress. alert  HENT: oral mucosa pink and moist  Head: Normocephalic.  Eyes: EOM are normal.  Neck: Normal range of motion. Neck supple. No thyromegaly present.  Cardiovascular: Normal rate and regular rhythm. No murmurs, rubs, or gallops  Respiratory: Effort normal and breath sounds normal. No respiratory distress.  GI: Soft. Bowel sounds are normal. She exhibits no distension.  Neurological:  patient is alert and a bit anxious. She is oriented x3 followed basic commands. RUE   3 deltoid, bicep, triceps 2+, WE 3, HI 3-, LUE 3 to 3+ deltoid, bicep, tricep, wrist and HI. LE: RHF, KE and ankle 2+ to 3+/5. LLE 2/5 proximal to distal. Decreased PP and LT/proprioception all 4's, lower more than upper, right more than left. DTR's 1+  Skin: Surgical site clean and dry without swelling Psych: pleasant, cooperative, NON-anxious today  Assessment/Plan: 1. Functional deficits secondary to cervical epidural abscess with myelopathy s/p C4-7 laminectomy and fusion C3-T1, which require 3+ hours per day of interdisciplinary therapy in a comprehensive inpatient rehab setting. Physiatrist is  providing close team supervision and 24 hour management of active medical problems listed below. Physiatrist and rehab team continue to assess barriers to discharge/monitor patient progress toward functional and medical goals. FIM: FIM - Bathing Bathing Steps Patient Completed: Chest;Abdomen Bathing: 1: Total-Patient completes 0-2 of 10 parts or less than 25%  FIM - Upper Body Dressing/Undressing Upper body dressing/undressing steps patient completed: Thread/unthread left sleeve of pullover shirt/dress Upper body dressing/undressing: 2: Max-Patient completed 25-49% of tasks FIM - Lower Body Dressing/Undressing Lower body dressing/undressing: 1: Total-Patient completed less than 25% of tasks  FIM - Toileting Toileting: 0: Activity did not occur  FIM - Air cabin crew Transfers: 0-Activity did not occur  FIM - Control and instrumentation engineer Devices: HOB elevated;Bed rails Bed/Chair Transfer: 2: Supine > Sit: Max A (lifting assist/Pt. 25-49%);2: Sit > Supine: Max A (lifting assist/Pt. 25-49%)  FIM - Locomotion: Wheelchair Locomotion: Wheelchair: 0: Activity did not occur FIM - Locomotion: Ambulation Locomotion: Ambulation Assistive Devices: Administrator Ambulation/Gait Assistance: 1: +2 Total assist (+3) Locomotion: Ambulation: 0: Activity did not occur  Comprehension Comprehension Mode: Auditory Comprehension: 5-Follows basic conversation/direction: With no assist  Expression Expression Mode: Verbal Expression: 5-Expresses basic needs/ideas: With extra time/assistive device  Social Interaction Social Interaction: 7-Interacts appropriately with others - No medications needed.  Problem Solving Problem Solving: 4-Solves basic 75 - 89% of the time/requires cueing 10 - 24% of the time  Memory Memory: 6-More than reasonable amt of time  Medical Problem List and Plan:  1. Cervical epidural abscess with tetraplegia. Status post anterior cervical  decompression discectomy multilevel C4-C7 07/16/2013 with subsequent cord compression undergoing cervical 4-7 posterior cervical fusion, C4-C7 cervical laminectomy and C3-T1 levels 07/28/2013  2. DVT Prophylaxis/Anticoagulation: SCDs.   lovenox 30 q12. Dopplers negative 3. Pain Management: Neurontin 100 mg 3 times a day, Hydrocodone and Robaxin as needed. Monitor with increased mobility  4.ID/microaerophilic streptococci. Intravenous ceftriaxone through 08/26/2013  5. Neuropsych: This patient is capable of making decisions on her own behalf.  -low dose valium prn for anxiety (limit as much as possible) --discussed with patient 6. Hypertension. Lisinopril 5 mg daily, Lasix 20 mg daily.  7. Asthma. Continue Dulera puffs twice daily as well as Flonase/CPAP  8. Neurogenic bladder/bowel.  Foley replaced for retention   -VRE in urine---will not treat given multi-resistance--dc foley when possible  -moving bowels 9. Obesity. History of gastric bypass  10. Insomnia. Trazodone 50 mg each bedtime  LOS (Days) 5 A FACE TO FACE EVALUATION WAS PERFORMED  SWARTZ,[name] 08/05/2013 8:58 AM

## 2013-08-05 NOTE — Progress Notes (Signed)
Social Work Patient ID: [name], female   DOB: Jun 09, 1934, 79 y.o.   MRN: [id number]  CSW met with pt to update her on team conference discussion.  Told pt that her targeted d/c date is 08-28-13, but may be sooner if her plan becomes SNF tx.  Pt expressed understanding.  Pt feels right hand splint is helping and that she slept better last night.  She is happy that iron will be decreased to home dose.  Pt is feeling less anxious currently and feels she has adjusted to wearing the neck brace.  Pt is hopeful that therapies are helping her and she is feeling good about what she has done so far.  CSW attempted to call pt's son multiple times to update him, without success, and his voicemail was full.  CSW sent him an email via [facility] email and await response.  No other needs/concerns at this time per pt.  CSW will continue to follow and assist as needed.

## 2013-08-05 NOTE — Patient Care Conference (Signed)
Inpatient RehabilitationTeam Conference and Plan of Care Update Date: 08/03/2013   Time: 2:15 PM    Patient Name: [name]      Medical Record Number: [id number]  Date of Birth: 1935/04/03 Sex: Female         Room/Bed: 4W25C/4W25C-01 Payor Info: Payor: [employer] MEDICARE / Plan: AARP MEDICARE COMPLETE / Product Type: *No Product type* /    Admitting Diagnosis: other  Admit Date/Time:  07/31/2013  5:38 PM Admission Comments: No comment available   Primary Diagnosis:  <principal problem not specified> Principal Problem: <principal problem not specified>  Patient Active Problem List   Diagnosis Date Noted  . Abscess in epidural space of cervical spine 07/30/2013  . Epidural abscess (embolic) of spinal cord 40/98/1191  . Epidural abscess 07/22/2013  . Spinal epidural abscess 07/16/2013  . Acute respiratory failure 07/16/2013  . OSA on CPAP 07/16/2013    Expected Discharge Date: Expected Discharge Date: 08/28/13 (SNF)  Team Members Present: Physician leading conference: Dr. Alger Simons Social Worker Present: Alfonse Alpers, LCSW Nurse Present: Other (comment) Soyla Dryer, RN) PT Present: Canary Brim, Rayburn Ma, PT OT Present: Chrys Racer, Starling Manns, OT;Frank [place], OT PPS Coordinator present : Ileana Ladd, PT     Current Status/Progress Goal Weekly Team Focus  Medical   improved motor function after re-exploration/surgery. on iv abx. anxiety better. pain improved  continued work on improving functional use of limbs  pain, ID, anxiety mgt, splinting   Bowel/Bladder   continent of bowel LBM 3/22; Foley catheter for acute urinary retention placed 3/21  remain continent of bowel, resolve urinary retention  assess for foley removal   Swallow/Nutrition/ Hydration   Mod A upper body bathing and dressing (B & D), total A lower B & D, mech lift for transfers  Min A B & D lower body and transfers, Supervision Upper B & D and groomin, mod I  self-feeding,   Static and dynamic sitting balance, bed mobility, BUE strengthening, FMC of left hand.   ADL's   Max-Total (+2)  Overall Min Assist (may need to be downgraded depending on progress)  activity tolerance, strengthening, increased use of BUEs, safe toilet transfers, bed mobility, sit><stands, patient education and core strengthening   Mobility   +2 assist for sit <> stands and transfers, no ambulation yet  min assist (may need to be downgraded depending on progress)  sit <> stands, transfers, safety, endurance, NMR, generalized strengthening and conditioning of all four extremities.    Communication             Safety/Cognition/ Behavioral Observations            Pain   generalized pain mainly in back and neck coexists with anxiety, Percocet Q8H prn  Pain level less than or equal to 4  monitor pain throughout shift assess for need of pain medication or repositioning    Skin   incision to neck and back, bottom red (blanchable) dressing in place for breakdown prevention   no new skin breakdown/infection  monitor skin Q shift and prn     Rehab Goals Patient on target to meet rehab goals: Yes Rehab Goals Revised: This is pt's first conference since her readmission to [facility].  Goals set for min assist. *See Care Plan and progress notes for long and short-term goals.  Barriers to Discharge: profound neuro deficits with lack of support at home    Possible Resolutions to Barriers:  supervision at dc. ?SNF    Discharge Planning/Teaching  Needs:  home with family to assist vs possible SNF dependent on progress  Family can be available for family education as needed.   Team Discussion:  Pt is beginning to have strength return to her RUE.  OT recommended a right hand splint for pt.  Pt's pain is improving and team is still addressing pt's anxiety.  Pt is worried about iron being constipating and Dr. Naaman Plummer will adjust this to her home dose.  Foley to be removed in a day or two so that  pt can begin tx to the commode.    Revisions to Treatment Plan:  First team conference.  Goals set for min assist.   Continued Need for Acute Rehabilitation Level of Care: The patient requires daily medical management by a physician with specialized training in physical medicine and rehabilitation for the following conditions: Daily direction of a multidisciplinary physical rehabilitation program to ensure safe treatment while eliciting the highest outcome that is of practical value to the patient.: Yes Daily medical management of patient stability for increased activity during participation in an intensive rehabilitation regime.: Yes Daily analysis of laboratory values and/or radiology reports with any subsequent need for medication adjustment of medical intervention for : Neurological problems;Post surgical problems  Danelle Curiale, [name] 08/05/2013, 11:45 AM

## 2013-08-06 ENCOUNTER — Inpatient Hospital Stay (HOSPITAL_COMMUNITY): Payer: Medicare Other | Admitting: Occupational Therapy

## 2013-08-06 ENCOUNTER — Inpatient Hospital Stay (HOSPITAL_COMMUNITY): Payer: PRIVATE HEALTH INSURANCE

## 2013-08-06 ENCOUNTER — Inpatient Hospital Stay (HOSPITAL_COMMUNITY): Payer: Medicare Other | Admitting: Physical Therapy

## 2013-08-06 DIAGNOSIS — G8250 Quadriplegia, unspecified: Secondary | ICD-10-CM

## 2013-08-06 DIAGNOSIS — G061 Intraspinal abscess and granuloma: Secondary | ICD-10-CM

## 2013-08-06 MED ORDER — SENNOSIDES-DOCUSATE SODIUM 8.6-50 MG PO TABS
2.0000 | ORAL_TABLET | Freq: Two times a day (BID) | ORAL | Status: DC
  Administered 2013-08-06 – 2013-08-22 (×33): 2 via ORAL
  Administered 2013-08-23: 1 via ORAL
  Administered 2013-08-23 – 2013-08-25 (×5): 2 via ORAL
  Filled 2013-08-06 (×39): qty 2

## 2013-08-06 MED ORDER — BISACODYL 10 MG RE SUPP
10.0000 mg | RECTAL | Status: DC
  Administered 2013-08-07: 10 mg via RECTAL

## 2013-08-06 NOTE — Progress Notes (Signed)
Per report from Salome Spotted, OT, patient was assisted to the floor while transferring to the wheelchair.  Patient did not hit her head.  Upon assessment, no new bruising or wounds noted; patient denies pain.  Vital signs stable.  Marlowe [place], PA notified of the occurrence; no new orders received.  Patient's son, Deara Bober, was told about the incident by the patient and I also spoke with son; son understands situation.  Will continue to monitor.

## 2013-08-06 NOTE — Progress Notes (Signed)
Occupational Therapy Session Note  Patient Details  Name: [name] MRN: [id number] Date of Birth: 12-07-1934  Today's Date: 08/06/2013 Time: 0930-1030 Time Calculation (min): 60 min  Short Term Goals: Week 1:  OT Short Term Goal 1 (Week 1): Patient will complete lower body dressing with mod assist. OT Short Term Goal 2 (Week 1): Patient will complete toileting with min assist for thoroughness with toilet hygiene OT Short Term Goal 3 (Week 1): Patient will perform tranfer to tub bench with mod assist OT Short Term Goal 4 (Week 1): Patient will demo improved [place] of bil hands as evidenced by ability open containers independently during self-feeding  Skilled Therapeutic Interventions/Progress Updates: ADL-retraining with focus on improved bed mobility, transfers, dynamic sitting balance and B-UE function.   Patient received in bed with RN techs performing removal of catheter.   Patient receptive to treatment, alert and oriented w/o complaint of pain.   Patient performed side rolling as needed for assisted lower body bathing, specifically at buttocks, but was able to wash her left foot by bending her knee and flexing her hip to wash her foot.   Patient was unable to wash right foot but used LH sponge to progress up her leg. Patient was advised to attempt dressing sitting at edge of bed this session due evidence and improved LE function and improved UE function observed during bed mobilty.  With only min assist to rise from supine to sitting at edge of bed, patient donned left pant leg up to her knee but required assist with right.   Patient then stood at [facility] without assistance and maintained static standing balance while OT pulled up her pants.   After donning pants and brief rest break, pt performed right lateral scoot transfer to w/c with min-mod assist without incident.   Pt then complained of poor fitting pants and requested assist with readjustment.   During setup assist to readjust her pants,  pt was unable to maintain static sitting balance in w/c resulting in an assisted fall to floor, without injury.   Patient was recovered to bed using mech lift and received RN assessment s/p fall.    Therapy Documentation Precautions:  Precautions Precautions: Cervical;Fall Precaution Comments: Parasthesias Required Braces or Orthoses: Cervical Brace Cervical Brace: Hard collar;At all times Restrictions Weight Bearing Restrictions: No Other Position/Activity Restrictions: no lifting, pushing, or heaving lifting  Vital Signs: Therapy Vitals Temp: 97.8 F (36.6 C) Temp src: Oral Pulse Rate: 75 Resp: 18 BP: 126/80 mmHg Patient Position, if appropriate: Lying Oxygen Therapy SpO2: 93 % O2 Device: None (Room air)  Pain: No pain    See FIM for current functional status  Therapy/Group: Individual Therapy  Second session: Time: 1300-1400 Time Calculation (min):  45 min OT (60 min total)  Pain Assessment: No pain  Skilled Therapeutic Interventions: Co-Tx session with physical therapist.  Initial treatment with OT emphasis on bed mobility, BUE functional tasks (self-feeding/grooming), and dynamic sitting/standing balance.   Patient received in bed unaware that lunch was provided.   Patient completed bed mobility and mech lift transfer to w/c with min assist and sat to eat her lunch with min setup assist and no use of AE to enable improved grip with utensils.   Patient was encouraged to use RUE as assist with food setup and to perform weight-shifts independently using BUE and armrests.   After physical therapist arrived in room, patient was escorted to gym with physical therapist to perform transfer to raised mat, static/dynamic sitting balance  activities, and 3 sets of five squats with manual facilitation to inhibit RLE hyperextension of knee and to promote improved postural control.     See FIM for current functional status  Therapy/Group: Co-Treatment  Therapy  [name] 08/06/2013, 12:54 PM

## 2013-08-06 NOTE — Progress Notes (Signed)
Patient required manual disimpaction today and yesterday and states "I have not had a normal bowel movement in over a month."  Pam Love, PA notified of constipation and disimpaction; new orders received.  Will continue to monitor.

## 2013-08-06 NOTE — Progress Notes (Signed)
Occupational Therapy Session Note  Patient Details  Name: [name] MRN: [id number] Date of Birth: 1934/08/20  Today's Date: 08/06/2013 Time: 9518-8416 Time Calculation (min): 46 min  Skilled Therapeutic Interventions/Progress Updates:    Pt transferred squat pivot from the wheelchair to the EOB with max assist to the right side.  Sat EOM with close supervision.  Began by having pt maintain balance while therapist provided resistance in all directions.  Pt needing min assist to maintain balance to resistance especially when attempting to engage abdominals.  Worked on picking up checkers and placing them in the connect four using the RUE.  She needed mod facilitation to achieve at least 70 degrees shoulder flexion to place checkers.  Pt performed several intervals with occasional drops (2/10).  No drops noted with use of the LUE to place the checkers.  Pt reported feeling nauseas and dizzy after sitting EOM for 15 mins with increased sweating noted.  Pt transferred back to the wheelchair with max assist and BP taken.  BP taken at 132/85 and HR 77 BPM.  Pt returned back to room and Steady used to transfer back to bed with min assist.  Pt reported feeling better after return to bed.    Therapy Documentation Precautions:  Precautions Precautions: Cervical;Fall Precaution Comments: Parasthesias Required Braces or Orthoses: Cervical Brace Cervical Brace: Hard collar;At all times Restrictions Weight Bearing Restrictions: No Other Position/Activity Restrictions: no lifting, pushing, or heaving lifting  Pain: Pain Assessment Pain Assessment: Faces Faces Pain Scale: Hurts a little bit Pain Type: Surgical pain Pain Intervention(s): Repositioned ADL: See FIM for current functional status  Therapy/Group: Individual Therapy  Jarriel Papillion OTR/L 08/06/2013, 4:31 PM

## 2013-08-06 NOTE — Progress Notes (Signed)
Physical Therapy Session Note  Patient Details  Name: [name] MRN: [id number] Date of Birth: Dec 07, 1934  Today's Date: 08/06/2013 Time: 1950-9326 Time Calculation (min): 29 min  Short Term Goals: Week 1:  PT Short Term Goal 1 (Week 1): Pt will be able to complete transfers from bed<>chair with mod A.  PT Short Term Goal 2 (Week 1): Pt will be able to propel wheel chair 25' feet with mod A.  PT Short Term Goal 3 (Week 1): Pt will be able to ambulate 5 feet using the LRAD with +2 total assist  Skilled Therapeutic Interventions/Progress Updates:    Pt resting in bed. Began with there-ex to bil LEs: ankle pumps, SLR (Rt AAROM), heel slides with friction minimized (Rt AAROM), hip abduction/adduction squeeze with knees flexed. Practiced supine> sit for functional strengthening, overall mod/max assist with increased time and facilitation of appropriate weight shift. Lateral scooting along bed performed with mod assist and Lt knee blocked. Sit> supine with mod assist for bil LEs. Pt able to reposition laterally in bed by self.   Second Session Time:  1345-1400 Time Calculation (min): 15 min (30 min total) Skilled Therapeutic Interventions/Progress Updates:  Co-treat with OT. Session focused on sit <> stands, bil LE strengthening, and scoot transfers. Pt mod assist for scoot transfer Lt/Rt with additional person for safety. PT tactile/verbal cues for anterior trunk lean and keeping hips posterior, frequent cues for repositioning of bil LEs. Sit <> stands from mat with min assist, squats in standing with OT/PT facilitating modulated knee control and decreasing knee hyperextension.   Therapy Documentation Precautions:  Precautions Precautions: Cervical;Fall Precaution Comments: Parasthesias Required Braces or Orthoses: Cervical Brace Cervical Brace: Hard collar;At all times Restrictions Weight Bearing Restrictions: No Other Position/Activity Restrictions: no lifting, pushing, or heaving  lifting Pain:  no c/o pain either session  See FIM for current functional status  Therapy/Group: Individual Therapy first session, co-treat with OT second session  [name] 08/06/2013, 12:00 PM

## 2013-08-06 NOTE — Progress Notes (Signed)
[place] PHYSICAL MEDICINE & REHABILITATION     PROGRESS NOTE    Subjective/Complaints: No new issues A 12 point review of systems has been performed and if not noted above is otherwise negative.   Objective: Vital Signs: Blood pressure 139/67, pulse 83, temperature 97.6 F (36.4 C), temperature source Oral, resp. rate 18, SpO2 93.00%. No results found. No results found for this basename: WBC, HGB, HCT, PLT,  in the last 72 hours No results found for this basename: NA, K, CL, CO, GLUCOSE, BUN, CREATININE, CALCIUM,  in the last 72 hours CBG (last 3)   Recent Labs  08/04/13 1138  GLUCAP 114*    Wt Readings from Last 3 Encounters:  07/30/13 111.131 kg (245 lb)  07/30/13 111.131 kg (245 lb)  07/20/13 79.561 kg (175 lb 6.4 oz)    Physical Exam:  Constitutional:  79 year old obese female, no distress. alert  HENT: oral mucosa pink and moist  Head: Normocephalic.  Eyes: EOM are normal.  Neck: Normal range of motion. Neck supple. No thyromegaly present.  Cardiovascular: Normal rate and regular rhythm. No murmurs, rubs, or gallops  Respiratory: Effort normal and breath sounds normal. No respiratory distress.  GI: Soft. Bowel sounds are normal. She exhibits no distension.  Neurological:  patient is alert and a bit anxious. She is oriented x3 followed basic commands. RUE   3 deltoid, bicep, triceps 2+, WE 3, HI 3-, LUE 3 to 3+ deltoid, bicep, tricep, wrist and HI. LE: RHF, KE and ankle 2+ to 3+/5. LLE 2/5 proximal to distal. Decreased PP and LT/proprioception all 4's, lower more than upper, right more than left. DTR's 1+  Skin: Surgical site clean and dry without swelling Psych: pleasant, cooperative, NON-anxious today  Assessment/Plan: 1. Functional deficits secondary to cervical epidural abscess with myelopathy s/p C4-7 laminectomy and fusion C3-T1, which require 3+ hours per day of interdisciplinary therapy in a comprehensive inpatient rehab setting. Physiatrist is  providing close team supervision and 24 hour management of active medical problems listed below. Physiatrist and rehab team continue to assess barriers to discharge/monitor patient progress toward functional and medical goals. FIM: FIM - Bathing Bathing Steps Patient Completed: Chest;Right Arm;Left Arm;Abdomen;Right upper leg;Left upper leg Bathing: 3: Mod-Patient completes 5-7 32f 10 parts or 50-74% (using LH sponge)  FIM - Upper Body Dressing/Undressing Upper body dressing/undressing steps patient completed: Thread/unthread right sleeve of pullover shirt/dresss;Thread/unthread left sleeve of pullover shirt/dress Upper body dressing/undressing: 3: Mod-Patient completed 50-74% of tasks FIM - Lower Body Dressing/Undressing Lower body dressing/undressing: 1: Total-Patient completed less than 25% of tasks  FIM - Toileting Toileting: 0: Activity did not occur  FIM - Air cabin crew Transfers: 0-Activity did not occur  FIM - Control and instrumentation engineer Devices: Bed rails;HOB elevated Bed/Chair Transfer: 1: Two helpers  FIM - Locomotion: Wheelchair Locomotion: Wheelchair: 0: Activity did not occur FIM - Locomotion: Ambulation Locomotion: Ambulation Assistive Devices: [employer] Ambulation/Gait Assistance: 1: +2 Total assist Locomotion: Ambulation: 1: Two helpers  Comprehension Comprehension Mode: Auditory Comprehension: 6-Follows complex conversation/direction: With extra time/assistive device  Expression Expression Mode: Verbal Expression: 6-Expresses complex ideas: With extra time/assistive device  Social Interaction Social Interaction: 7-Interacts appropriately with others - No medications needed.  Problem Solving Problem Solving: 5-Solves basic 90% of the time/requires cueing < 10% of the time  Memory Memory: 6-More than reasonable amt of time  Medical Problem List and Plan:  1. Cervical epidural abscess with tetraplegia. Status post anterior  cervical decompression discectomy multilevel C4-C7 07/16/2013 with subsequent cord  compression undergoing cervical 4-7 posterior cervical fusion, C4-C7 cervical laminectomy and C3-T1 levels 07/28/2013  2. DVT Prophylaxis/Anticoagulation: SCDs.   lovenox 30 q12. Dopplers negative 3. Pain Management: Neurontin 100 mg 3 times a day, Hydrocodone and Robaxin as needed. Monitor with increased mobility  4.ID/microaerophilic streptococci. Intravenous ceftriaxone through 08/26/2013  5. Neuropsych: This patient is capable of making decisions on her own behalf.  -low dose valium prn for anxiety (limit as much as possible) --discussed with patient 6. Hypertension. Lisinopril 5 mg daily, Lasix 20 mg daily.  7. Asthma. Continue Dulera puffs twice daily as well as Flonase/CPAP  8. Neurogenic bladder/bowel.  Foley replaced for retention   -VRE in urine---will not treat given multi-resistance--dc foley today---resume voiding trial  -moving bowels 9. Obesity. History of gastric bypass  10. Insomnia. Trazodone 50 mg each bedtime  LOS (Days) 6 A FACE TO FACE EVALUATION WAS PERFORMED  SWARTZ,ZACHARY T 08/06/2013 8:15 AM

## 2013-08-07 ENCOUNTER — Inpatient Hospital Stay (HOSPITAL_COMMUNITY): Payer: PRIVATE HEALTH INSURANCE | Admitting: Occupational Therapy

## 2013-08-07 ENCOUNTER — Inpatient Hospital Stay (HOSPITAL_COMMUNITY): Payer: PRIVATE HEALTH INSURANCE | Admitting: Physical Therapy

## 2013-08-07 DIAGNOSIS — R030 Elevated blood-pressure reading, without diagnosis of hypertension: Secondary | ICD-10-CM

## 2013-08-07 DIAGNOSIS — G4733 Obstructive sleep apnea (adult) (pediatric): Secondary | ICD-10-CM

## 2013-08-07 DIAGNOSIS — K5900 Constipation, unspecified: Secondary | ICD-10-CM

## 2013-08-07 DIAGNOSIS — G061 Intraspinal abscess and granuloma: Secondary | ICD-10-CM

## 2013-08-07 MED ORDER — LINACLOTIDE 290 MCG PO CAPS
290.0000 ug | ORAL_CAPSULE | Freq: Every day | ORAL | Status: DC
  Administered 2013-08-07 – 2013-08-26 (×20): 290 ug via ORAL
  Filled 2013-08-07 (×21): qty 1

## 2013-08-07 NOTE — Progress Notes (Signed)
Occupational Therapy Session Note  Patient Details  Name: [name] MRN: [id number] Date of Birth: 28-Nov-1934  Today's Date: 08/07/2013 Time: 3419-3790 Time Calculation (min): 45 min  Skilled Therapeutic Interventions/Progress Updates: Patient scheduled for ADL.  IV nurse reported had emergency and was unable to take out of IV for bathing and dressing.  So, this clinician helped patient wash Upper body by pulling up blouse and washing where body part was accessible.  Patient was able to use sponge on bed before she got out of bed to wash her legs and feet.  Patient voiced need to sit on bed side commode as nursing had recently given a laxative or enema.  She was able to have small, hard pieces of stool and quite a bit of gas expelled.  She was able to stand at her walker with Mod A x 2 for about 3 minutes while this clinician pericleansed and pulled up her pants.  At about 3 minutes her legs became shakey and she stated she needed to sit down.   She was able to complete a transfer from ed side commode to wheel chair with Mod A x 2 stand pivot.  Patient exhibited greater UE and LE strength and mobility than she stated she was able to do at the beginning of the session.  Patient was left sitting up in her reclinging back wheel chair with safety belt in place and with the RN who was preparing to assist her with oral care as this clinician and rehab tech were leaving the room.    Therapy Documentation Precautions:  Precautions Precautions: Cervical;Fall Precaution Comments: Parasthesias Required Braces or Orthoses: Cervical Brace Cervical Brace: Hard collar;At all times Restrictions Weight Bearing Restrictions: No Other Position/Activity Restrictions: no lifting, pushing, or heaving lifting  Pain:denied  Therapy/Group: Individual Therapy  Alfredia Ferguson [facility] 08/07/2013, 4:51 PM

## 2013-08-07 NOTE — Progress Notes (Signed)
[name] is a 79 y.o. female 23-Sep-1934 [id number]  Subjective: No new complaints. No new problems: still constipated. Slept well. Feeling OK.  Objective: Vital signs in last 24 hours: Temp:  [97.3 F (36.3 C)-98.7 F (37.1 C)] 97.7 F (36.5 C) (03/28 0428) Pulse Rate:  [75-83] 77 (03/28 0428) Resp:  [17-18] 17 (03/28 0428) BP: (111-157)/(66-85) 127/69 mmHg (03/28 0428) SpO2:  [91 %-96 %] 93 % (03/28 0428) Weight change:  Last BM Date: 08/05/13  Intake/Output from previous day: 03/27 0701 - 03/28 0700 In: 340 [P.O.:340] Out: 475 [Urine:475] Last cbgs: CBG (last 3)   Recent Labs  08/04/13 1138  GLUCAP 114*     Physical Exam General: No apparent distress   HEENT: not dry Lungs: Normal effort. Lungs clear to auscultation, no crackles or wheezes. Cardiovascular: Regular rate and rhythm, no edema Abdomen: S/NT/ND; BS(+) Musculoskeletal:  unchanged Neurological: No new neurological deficits Wounds: N/A    Skin: clear  Aging changes Mental state: Alert, oriented, cooperative    Lab Results: BMET    Component Value Date/Time   NA 139 08/02/2013 0548   K 4.2 08/02/2013 0548   CL 98 08/02/2013 0548   CO2 31 08/02/2013 0548   GLUCOSE 113* 08/02/2013 0548   BUN 10 08/02/2013 0548   CREATININE 0.44* 08/02/2013 0548   CALCIUM 8.9 08/02/2013 0548   GFRNONAA >90 08/02/2013 0548   GFRAA >90 08/02/2013 0548   CBC    Component Value Date/Time   WBC 8.0 08/02/2013 0548   RBC 3.32* 08/02/2013 0548   HGB 10.2* 08/02/2013 0548   HCT 31.6* 08/02/2013 0548   PLT 281 08/02/2013 0548   MCV 95.2 08/02/2013 0548   MCH 30.7 08/02/2013 0548   MCHC 32.3 08/02/2013 0548   RDW 13.9 08/02/2013 0548   LYMPHSABS 2.1 07/26/2013 1415   MONOABS 0.8 07/26/2013 1415   EOSABS 0.2 07/26/2013 1415   BASOSABS 0.0 07/26/2013 1415    Studies/Results: No results found.  Medications: I have reviewed the patient's current medications.  Assessment/Plan:   1. Cervical epidural abscess with  tetraplegia. Status post anterior cervical decompression discectomy multilevel C4-C7 07/16/2013 with subsequent cord compression undergoing cervical 4-7 posterior cervical fusion, C4-C7 cervical laminectomy and C3-T1 levels 07/28/2013  2. DVT Prophylaxis/Anticoagulation: SCDs. lovenox 30 q12. Dopplers negative  3. Pain Management: Neurontin 100 mg 3 times a day, Hydrocodone and Robaxin as needed. Monitor with increased mobility  4.ID/microaerophilic streptococci. Intravenous ceftriaxone through 08/26/2013  5. Neuropsych: This patient is capable of making decisions on her own behalf.  -low dose valium prn for anxiety (limit as much as possible) --discussed with patient  6. Hypertension. Lisinopril 5 mg daily, Lasix 20 mg daily.  7. Asthma. Continue Dulera puffs twice daily as well as Flonase/CPAP  8. Neurogenic bladder/bowel. Foley replaced for retention  -VRE in urine---will not treat given multi-resistance--dc foley today---resume voiding trial  -try Linzess 9. Obesity. History of gastric bypass  10. Insomnia. Trazodone 50 mg each bedtime    Length of stay, days: 7  Walker Kehr , MD 08/07/2013, 9:09 AM

## 2013-08-07 NOTE — Progress Notes (Signed)
Nurse tech informed this nurse patient has pulse ox read of 86% on room air. Patient with complaints of slight shortness of breath and says, "I feel wheezy, like allergies. I used to wear oxygen at home when I was 300+ pounds." Patient with physical therapy, brought back to room, rechecked, pulse ox continues at 86%.  Lungs clear to auscultation. Orders for nasal cannula 2L from Dr. Alain Marion received. O2 placed. No acute distress. Will continue to monitor. Tonita Cong, RN

## 2013-08-07 NOTE — Progress Notes (Signed)
Physical Therapy Session Note  Patient Details  Name: MAIRELY FOXWORTH MRN: [id number] Date of Birth: 1934/12/16  Today's Date: 08/07/2013 Time: 1530-1630 Time Calculation (min): 60 min  Short Term Goals: Week 1:  PT Short Term Goal 1 (Week 1): Pt will be able to complete transfers from bed<>chair with mod A.  PT Short Term Goal 2 (Week 1): Pt will be able to propel wheel chair 25' feet with mod A.  PT Short Term Goal 3 (Week 1): Pt will be able to ambulate 5 feet using the LRAD with +2 total assist  Therapy Documentation Precautions:  Precautions Precautions: Cervical;Fall Precaution Comments: Parasthesias Required Braces or Orthoses: Cervical Brace Cervical Brace: Hard collar;At all times Restrictions Weight Bearing Restrictions: No Other Position/Activity Restrictions: no lifting, pushing, or heaving lifting Pain: Pain Assessment Pain Assessment: 0-10 Pain Score: 2 Pain Type: Surgical pain Pain Location: Neck Pain Descriptors / Indicators: Aching Pain Intervention(s): Medication (See eMAR);Repositioned  Therapeutic Activity:(15') bed mobility rolling L with Mod-assist and use of rail, L sidelying to sitting EOB with Mod-assist, scooting to EOB with S/Mod-Independent assist                    Transfer sit<->standing with Handheld assist/min-assist and blocking of knees, transfer bed->w/c via stand-step-transfer to the Left with min-assist. Therapeutic Exercise:(30') supine and sitting B LE exercises active and manually resisted as well as 3# ankle weight with long arc quads. Gait Training:(15') inside parallel bars sit<->stand and step-in-place x 2 steps with S/min-assist to ensure knees didn't buckle   Therapy/Group: Individual Therapy  Clearence Ped 08/07/2013, 4:27 PM

## 2013-08-08 ENCOUNTER — Inpatient Hospital Stay (HOSPITAL_COMMUNITY): Payer: PRIVATE HEALTH INSURANCE | Admitting: *Deleted

## 2013-08-08 MED ORDER — NYSTATIN 100000 UNIT/ML MT SUSP
5.0000 mL | Freq: Four times a day (QID) | OROMUCOSAL | Status: DC
  Administered 2013-08-08 – 2013-08-26 (×70): 500000 [IU] via ORAL
  Filled 2013-08-08 (×78): qty 5

## 2013-08-08 NOTE — Progress Notes (Signed)
[name] is a 79 y.o. female 1934-11-11 [id number]  Subjective: C/o mouth and throat discomfort. No new problems: still constipated. Slept well. Feeling OK.  Objective: Vital signs in last 24 hours: Temp:  [98.1 F (36.7 C)-98.2 F (36.8 C)] 98.1 F (36.7 C) (03/29 0500) Pulse Rate:  [71-79] 71 (03/29 0500) Resp:  [17-18] 18 (03/29 0500) BP: (123-143)/(72-74) 123/74 mmHg (03/29 0500) SpO2:  [86 %-98 %] 98 % (03/29 0736) Weight change:  Last BM Date: 08/06/13  Intake/Output from previous day: 03/28 0701 - 03/29 0700 In: 340 [P.O.:340] Out: 875 [Urine:875] Last cbgs: CBG (last 3)  No results found for this basename: GLUCAP,  in the last 72 hours   Physical Exam General: No apparent distress   HEENT: not dry. [name] is present Lungs: Normal effort. Lungs clear to auscultation, no crackles or wheezes. Cardiovascular: Regular rate and rhythm, no edema Abdomen: S/NT/ND; BS(+) Musculoskeletal:  unchanged Neurological: No new neurological deficits Wounds: N/A    Skin: clear  Aging changes Mental state: Alert, oriented, cooperative    Lab Results: BMET    Component Value Date/Time   NA 139 08/02/2013 0548   K 4.2 08/02/2013 0548   CL 98 08/02/2013 0548   CO2 31 08/02/2013 0548   GLUCOSE 113* 08/02/2013 0548   BUN 10 08/02/2013 0548   CREATININE 0.44* 08/02/2013 0548   CALCIUM 8.9 08/02/2013 0548   GFRNONAA >90 08/02/2013 0548   GFRAA >90 08/02/2013 0548   CBC    Component Value Date/Time   WBC 8.0 08/02/2013 0548   RBC 3.32* 08/02/2013 0548   HGB 10.2* 08/02/2013 0548   HCT 31.6* 08/02/2013 0548   PLT 281 08/02/2013 0548   MCV 95.2 08/02/2013 0548   MCH 30.7 08/02/2013 0548   MCHC 32.3 08/02/2013 0548   RDW 13.9 08/02/2013 0548   LYMPHSABS 2.1 07/26/2013 1415   MONOABS 0.8 07/26/2013 1415   EOSABS 0.2 07/26/2013 1415   BASOSABS 0.0 07/26/2013 1415    Studies/Results: No results found.  Medications: I have reviewed the patient's current  medications.  Assessment/Plan:   1. Cervical epidural abscess with tetraplegia. Status post anterior cervical decompression discectomy multilevel C4-C7 07/16/2013 with subsequent cord compression undergoing cervical 4-7 posterior cervical fusion, C4-C7 cervical laminectomy and C3-T1 levels 07/28/2013  2. DVT Prophylaxis/Anticoagulation: SCDs. lovenox 30 q12. Dopplers negative  3. Pain Management: Neurontin 100 mg 3 times a day, Hydrocodone and Robaxin as needed. Monitor with increased mobility  4.ID/microaerophilic streptococci. Intravenous ceftriaxone through 08/26/2013  5. Neuropsych: This patient is capable of making decisions on her own behalf.  -low dose valium prn for anxiety (limit as much as possible) --discussed with patient  6. Hypertension. Lisinopril 5 mg daily, Lasix 20 mg daily.  7. Asthma. Continue Dulera puffs twice daily as well as Flonase/CPAP  8. Neurogenic bladder/bowel. Foley replaced for retention  -VRE in urine---will not treat given multi-resistance--dc foley today---resume voiding trial  -try Linzess 9. Obesity. History of gastric bypass  10. Insomnia. Trazodone 50 mg each bedtime 11. Thrush. See Rx    Length of stay, days: [place] , MD 08/08/2013, 8:33 AM

## 2013-08-08 NOTE — Plan of Care (Signed)
Problem: SCI BLADDER ELIMINATION Goal: RH STG MANAGE BLADDER WITH EQUIPMENT WITH ASSISTANCE STG Manage Bladder With Equipment With mod Assistance  Outcome: Progressing Foley d/c'd 3/27

## 2013-08-08 NOTE — Progress Notes (Addendum)
Occupational Therapy Note Patient Details  Name: THEORA VANKIRK MRN: [id number] Date of Birth: 1934/12/29 Today's Date: 08/08/2013  Time:  1400-1430  (30 min)  (Total time spent with pt  60 min) Pain:  3/10 back Co Treatment with PT session  Engaged in transfers, sitting balance, standing balance.  Pt. Sitting in wc upon OT arrival.  Propelled wc about 10 feert and then OT took over.  Pt. Transferred to mat with total assist +2 (pt= 40 %.).  Practiced sitting balance with a/p and lateral ws.  Did sit to stand with max assist.  Stood for 1 minute with cues for upright trunk and quad activation.  Did ring toss in sitting with minimal assist for balance.  Stood and took one step forward and backward with weight shifting and closed chain activity.  Transferred back to wc with Total assist +2, pt = 40 %.  Left in room with family.       Lisa Roca 08/08/2013, 2:30 PM

## 2013-08-08 NOTE — Progress Notes (Signed)
Physical Therapy Session Note  Patient Details  Name: TONNIE STILLMAN MRN: [id number] Date of Birth: [birth year]  Today's Date: 08/08/2013 Time: 1330-1400 Time Calculation (min): 30 min co tx with OT total time spent 60 min.   Skilled Therapeutic Interventions/Progress Updates:  Co treatment with OT,focus on sit to stand balance and weight distribution, patient able to achieve standing position and initiate rigthning reactions in standing with maxA.  Static and dynamic sitting balance training. Activities to displace COM form BOS is sitting. Transfer training ,max A of two to get from mat to w/c and back.   Therapy Documentation Precautions:  Precautions Precautions: Cervical;Fall Precaution Comments: Parasthesias Required Braces or Orthoses: Cervical Brace Cervical Brace: Hard collar;At all times Restrictions Weight Bearing Restrictions: No Other Position/Activity Restrictions: no lifting, pushing, or heaving lifting  See FIM for current functional status  Therapy/Group: Co-Treatment  [place]pe Spanish 08/08/2013, 2:41 PM

## 2013-08-09 ENCOUNTER — Inpatient Hospital Stay (HOSPITAL_COMMUNITY): Payer: Medicare Other

## 2013-08-09 ENCOUNTER — Inpatient Hospital Stay (HOSPITAL_COMMUNITY): Payer: PRIVATE HEALTH INSURANCE

## 2013-08-09 ENCOUNTER — Inpatient Hospital Stay (HOSPITAL_COMMUNITY): Payer: Medicare Other | Admitting: Physical Therapy

## 2013-08-09 DIAGNOSIS — G061 Intraspinal abscess and granuloma: Secondary | ICD-10-CM

## 2013-08-09 DIAGNOSIS — G8250 Quadriplegia, unspecified: Secondary | ICD-10-CM

## 2013-08-09 LAB — GLUCOSE, CAPILLARY: GLUCOSE-CAPILLARY: 147 mg/dL — AB (ref 70–99)

## 2013-08-09 MED ORDER — BETHANECHOL CHLORIDE 10 MG PO TABS
10.0000 mg | ORAL_TABLET | Freq: Three times a day (TID) | ORAL | Status: DC
  Administered 2013-08-09 (×3): 10 mg via ORAL
  Filled 2013-08-09 (×7): qty 1

## 2013-08-09 NOTE — Progress Notes (Signed)
[place] PHYSICAL MEDICINE & REHABILITATION     PROGRESS NOTE    Subjective/Complaints: Not emptying well. Needs to be cathed. Getting stronger as whole. Took a couple steps yesterday A 12 point review of systems has been performed and if not noted above is otherwise negative.   Objective: Vital Signs: Blood pressure 168/79, pulse 76, temperature 98 F (36.7 C), temperature source Oral, resp. rate 18, SpO2 94.00%. No results found. No results found for this basename: WBC, HGB, HCT, PLT,  in the last 72 hours No results found for this basename: NA, K, CL, CO, GLUCOSE, BUN, CREATININE, CALCIUM,  in the last 72 hours CBG (last 3)  No results found for this basename: GLUCAP,  in the last 72 hours  Wt Readings from Last 3 Encounters:  07/30/13 111.131 kg (245 lb)  07/30/13 111.131 kg (245 lb)  07/20/13 79.561 kg (175 lb 6.4 oz)    Physical Exam:  Constitutional:  79 year old obese female, no distress. alert  HENT: oral mucosa pink and moist  Head: Normocephalic.  Eyes: EOM are normal.  Neck: Normal range of motion. Neck supple. No thyromegaly present.  Cardiovascular: Normal rate and regular rhythm. No murmurs, rubs, or gallops  Respiratory: Effort normal and breath sounds normal. No respiratory distress.  GI: Soft. Bowel sounds are normal. She exhibits no distension.  Neurological:  patient is alert and a bit anxious. She is oriented x3 followed basic commands. RUE   3+ deltoid, bicep, triceps 3, WE 3+, HI almost 3, LUE 3 to 3+ deltoid, bicep, tricep, wrist and HI. LE: RHF, KE and ankle 2+ to 3+/5. LLE 2/5 proximal to distal. Decreased PP and LT/proprioception all 4's, lower more than upper, right more than left. DTR's 1+  Skin: Surgical site clean and dry without swelling Psych: pleasant, cooperative, NON-anxious today  Assessment/Plan: 1. Functional deficits secondary to cervical epidural abscess with myelopathy s/p C4-7 laminectomy and fusion C3-T1, which require 3+ hours  per day of interdisciplinary therapy in a comprehensive inpatient rehab setting. Physiatrist is providing close team supervision and 24 hour management of active medical problems listed below. Physiatrist and rehab team continue to assess barriers to discharge/monitor patient progress toward functional and medical goals. FIM: FIM - Bathing Bathing Steps Patient Completed: Right upper leg;Left upper leg;Right lower leg (including foot);Left lower leg (including foot) Bathing: 2: Max-Patient completes 3-4 9f 10 parts or 25-49%  FIM - Upper Body Dressing/Undressing Upper body dressing/undressing steps patient completed: Thread/unthread right sleeve of pullover shirt/dresss;Thread/unthread left sleeve of pullover shirt/dress Upper body dressing/undressing: 2: Max-Patient completed 25-49% of tasks FIM - Lower Body Dressing/Undressing Lower body dressing/undressing: 1: Two helpers  FIM - Toileting Toileting steps completed by patient: Adjust clothing prior to toileting Toileting Assistive Devices: Grab bar or rail for support Toileting: 1: Two helpers  FIM - Radio producer Devices: Recruitment consultant Transfers: 1-Two helpers  FIM - Control and instrumentation engineer Devices: Bed rails Bed/Chair Transfer: 2: Supine > Sit: Max A (lifting assist/Pt. 25-49%);1: Two helpers (two helpers for stand/squat pivot bed to chair)  FIM - Locomotion: Wheelchair Locomotion: Wheelchair: 0: Activity did not occur FIM - Locomotion: Ambulation Locomotion: Ambulation Assistive Devices: [employer] Ambulation/Gait Assistance: 1: +2 Total assist Locomotion: Ambulation: 1: Two helpers  Comprehension Comprehension Mode: Auditory Comprehension: 6-Follows complex conversation/direction: With extra time/assistive device  Expression Expression Mode: Verbal Expression: 6-Expresses complex ideas: With extra time/assistive device  Social Interaction Social Interaction:  6-Interacts appropriately with others with medication or extra time (  anti-anxiety, antidepressant).  Problem Solving Problem Solving: 5-Solves basic 90% of the time/requires cueing < 10% of the time  Memory Memory: 6-More than reasonable amt of time  Medical Problem List and Plan:  1. Cervical epidural abscess with tetraplegia. Status post anterior cervical decompression discectomy multilevel C4-C7 07/16/2013 with subsequent cord compression undergoing cervical 4-7 posterior cervical fusion, C4-C7 cervical laminectomy and C3-T1 levels 07/28/2013  2. DVT Prophylaxis/Anticoagulation: SCDs.   lovenox 30 q12. Dopplers negative 3. Pain Management: Neurontin 100 mg 3 times a day, Hydrocodone and Robaxin as needed. Monitor with increased mobility  4.ID/microaerophilic streptococci. Intravenous ceftriaxone through 08/26/2013  5. Neuropsych: This patient is capable of making decisions on her own behalf.  -low dose valium prn for anxiety (limit as much as possible) --discussed with patient 6. Hypertension. Lisinopril 5 mg daily, Lasix 20 mg daily.  7. Asthma. Continue Dulera puffs twice daily as well as Flonase/CPAP  8. Neurogenic bladder/bowel.  Foley replaced for retention   -VRE in urine---will not treat given multi-resistance-  -continue voiding trial. Add urechoine. OOB!!!  -moving bowels 9. Obesity. History of gastric bypass  10. Insomnia. Trazodone 50 mg each bedtime  LOS (Days) 9 A FACE TO FACE EVALUATION WAS PERFORMED  Skipper Dacosta T 08/09/2013 9:53 AM

## 2013-08-09 NOTE — Progress Notes (Signed)
Reviewed and in agreement with treatment provided.  

## 2013-08-09 NOTE — Progress Notes (Signed)
Occupational Therapy Session and Weekly Progress Note  Patient Details  Name: [name] MRN: [id number] Date of Birth: 1935/04/10  Today's Date: 08/09/2013 Time: 0830-0930  Time Calculation (min): 60 min  Patient has met 1 of 5 short term goals.  Patient is progressing with 2 of 5 goals and has partly met 1 of 5, goals revised due new information she provided relating to prior performance methods of ADL.  Patient continues to demonstrate the following deficits: Decreased dynamic sitting balance, impaired B-U/LE strength, impaired transfer skills, impaired endurance and therefore will continue to benefit from skilled OT intervention to enhance overall performance with BADL.  Patient progressing toward long term goals..  Continue plan of care.  OT Short Term Goals Week 1:  OT Short Term Goal 1 (Week 1): Patient will complete lower body dressing with mod assist. OT Short Term Goal 1 - Progress (Week 1): Progressing toward goal OT Short Term Goal 2 (Week 1): Patient will complete toileting with min assist for thoroughness with toilet hygiene OT Short Term Goal 2 - Progress (Week 1): Progressing toward goal OT Short Term Goal 3 (Week 1): Patient will perform tranfer to tub bench with mod assist OT Short Term Goal 3 - Progress (Week 1): Progressing toward goal OT Short Term Goal 4 (Week 1): Patient will demo improved [place] of bil hands as evidenced by ability open containers independently during self-feeding OT Short Term Goal 4 - Progress (Week 1): Met OT Short Term Goal 5 - Progress (Week 1): Partly met Week 2:  OT Short Term Goal 1 (Week 2): Patient will complete lower body dressing with mod assist OT Short Term Goal 2 (Week 2): Patient will perform tranfer to tub bench with mod assist OT Short Term Goal 3 (Week 2): Patient will complete upper body bathing with setup OT Short Term Goal 4 (Week 2): Patient will complete upper body dressing with min assist OT Short Term Goal 5 (Week 2):  Patient will maintain supported static standing balance during assisted ADL with min assist  Skilled Therapeutic Interventions/Progress Updates: ADL-retraining with focus on sit<>stand, static standing balance, BUE strength, adapted dressing skills, and dynamic sitting balance.   Patient received in her w/c after having been assisted with toileting and toilet hygiene.   Patient continues use of mech lift for toilet transfer and total assist with hygiene using Denna Haggard left, although tolerating stating standing for 30-sec - 1 minute before fatigue.  Patient completed upper body bathing and dressing at sink with setup assist and only mod assist to lace right arm through shirt and to pull shirt down.  Patient required max assist to don diaper, pants and socks but was able to maintain static standing balance using Denna Haggard for 1 minute while assisted with pulling up clothing to hips.   Patient was recovered to w/c with min guard from [facility] and completed oral care at sink, independently.  Patient continues to demo improved BUE function during bathing and dressing but lacks ability to attend to feet due to limited and flexibility from use of cervical collar.   Plan to incorporate patient's preferred method of donning socks, pants and shoes as she describes it, sitting in chair with her feet propped on bed while introducing use of AE to improve reach.     Therapy Documentation Precautions:  Precautions Precautions: Cervical;Fall Precaution Comments: Parasthesias Required Braces or Orthoses: Cervical Brace Cervical Brace: Hard collar;At all times Restrictions Weight Bearing Restrictions: No Other Position/Activity Restrictions: no lifting, pushing,  or heaving lifting  Pain: No/denies pain    See FIM for current functional status  Therapy/Group: Individual Therapy  [place] 08/09/2013, 3:34 PM

## 2013-08-09 NOTE — Progress Notes (Signed)
Occupational Therapy Session Note  Patient Details  Name: [name] MRN: [id number] Date of Birth: 1934-12-31  Today's Date: 08/09/2013 Time: 1287-8676 Time Calculation (min): 25 min  Short Term Goals: Week 2:  OT Short Term Goal 1 (Week 2): Patient will complete lower body dressing with mod assist OT Short Term Goal 2 (Week 2): Patient will perform tranfer to tub bench with mod assist OT Short Term Goal 3 (Week 2): Patient will complete upper body bathing with setup OT Short Term Goal 4 (Week 2): Patient will complete upper body dressing with min assist OT Short Term Goal 5 (Week 2): Patient will maintain supported static standing balance during assisted ADL with min assist  Skilled Therapeutic Interventions/Progress Updates: ADL-retraining with focus on bed mobility, dynamic sitting balance, sit <> stand, stand-pivot transfer using RW.   Patient received in her room, supine in bed with her son present initially.   With max assist to don socks, patient was able to complete bed mobility and rise from supine to sitting at edge of bed using HOB elevated and bed rail with only min assist.  Patient initiated lateral scooting toward w/c placed beside bed and performed sit<>stand while sitting at edge of bed, 3 times with 1 rest break and +2 assist for re-ed on proper approach to RW.   Pt then performed static standing balance training with +2 facilitation to shift weight from left to right LE and bear place and advance right foot during transfer.   Patient continues to exhibit RLE weakness and requires mod facilitation for improved postural control.        Therapy Documentation Precautions:  Precautions Precautions: Cervical;Fall Precaution Comments: Parasthesias Required Braces or Orthoses: Cervical Brace Cervical Brace: Hard collar;At all times Restrictions Weight Bearing Restrictions: No Other Position/Activity Restrictions: no lifting, pushing, or heaving lifting  Vital  Signs: Therapy Vitals Temp: 98 F (36.7 C) Temp src: Oral Pulse Rate: 74 Resp: 18 BP: 169/77 mmHg Patient Position, if appropriate: Lying Oxygen Therapy SpO2: 93 %  Pain: Pain Assessment No/denies pain  See FIM for current functional status  Therapy/Group: Individual Therapy  [place] 08/09/2013, 5:45 PM

## 2013-08-09 NOTE — Progress Notes (Signed)
Physical Therapy Weekly Progress Note  Patient Details  Name: [name] MRN: [id number] Date of Birth: Sep 27, 1934  Today's Date: 08/09/2013 Time: 1000-1100 Time Calculation (min): 60 min  Patient has met 1 of 3 short term goals.  Pt has made slow but noticeable progress this week. She is currently able to scoot transfer bed<>w/c (level) with mod assist however needs consistent cues for UE/LE positioning for safety and to weight shift appropriately.   Patient continues to demonstrate the following deficits: quadriparesis, difficulty with transfers and sitting/standing balance, poor balance reactions particularly in standing, need for lift equipment with ambulation, elevated falls risk and therefore will continue to benefit from skilled PT intervention to enhance overall performance with activity tolerance, balance, ability to compensate for deficits and functional use of  right upper extremity, right lower extremity, left upper extremity and left lower extremity.  Patient progressing toward long term goals..  Plan of care revisions: goals downgraded to reflect expected progress. .  PT Short Term Goals Week 1:  PT Short Term Goal 1 (Week 1): Pt will be able to complete transfers from bed<>chair with mod A.  PT Short Term Goal 1 - Progress (Week 1): Met PT Short Term Goal 2 (Week 1): Pt will be able to propel wheel chair 25' feet with mod A.  PT Short Term Goal 2 - Progress (Week 1): Progressing toward goal PT Short Term Goal 3 (Week 1): Pt will be able to ambulate 5 feet using the LRAD with +2 total assist PT Short Term Goal 3 - Progress (Week 1): Partly met  Skilled Therapeutic Interventions/Progress Updates:    Session focused on ambulation using Maxi-sky lift equipment for safety. Ambulation 3 x 15' with RW forwards, 5' backstepping performed with +2 assist for safety. Much improvement from last attempt at this, improved placement of Rt LE however Rt LE still significantly impaired  with tendency towards strong adduction. Visual feedback provided. Maxisky sling progressively loosened to allow pt more degrees of freedom, pt tolerated well.   Pt able to sit>stand w/c>RW with min assist to place sling however required +2 assist once due to significant loss of balance to Lt. HR 78, SpO2 98 RA post ambulation.  Therapy Documentation Precautions:  Precautions Precautions: Cervical;Fall Precaution Comments: Parasthesias Required Braces or Orthoses: Cervical Brace Cervical Brace: Hard collar;At all times Restrictions Weight Bearing Restrictions: No Other Position/Activity Restrictions: no lifting, pushing, or heaving lifting Pain: No c/o pain See FIM for current functional status  Therapy/Group: Individual Therapy  [name] 08/09/2013, 12:20 PM

## 2013-08-09 NOTE — Progress Notes (Signed)
Physical Therapy Session Note  Patient Details  Name: [name] MRN: [id number] Date of Birth: 28-Mar-1935  Today's Date: 08/09/2013 Time: 1500-1529 Time Calculation (min): 29 min  Skilled Therapeutic Interventions/Progress Updates:    Session consisted of pt performing transfers recliner<>toilet with  Total A x 2 and the use of a steady lift (pt able to perform sit to stand transfer with Min-Mod A +2 but required steady in order to safely transfer to toilet) cueing required for proper foot placement on steady and proper LE activation when performing transfer. Pt also worked on static standing tolerance x 4 with the use of the steady lift (able to maintain static stance for up to 1 minute with S with the use of BUE support on the steady in order to don and doff diaper and pants) in order to improve functional independence.   Therapy Documentation Precautions:  Precautions Precautions: Cervical;Fall Precaution Comments: Parasthesias Required Braces or Orthoses: Cervical Brace Cervical Brace: Hard collar;At all times Restrictions Weight Bearing Restrictions: No Other Position/Activity Restrictions: no lifting, pushing, or heaving lifting Pain:  Pt had no c/o pain during the session.  See FIM for current functional status  Therapy/Group: Individual Therapy  Mickal Meno 08/09/2013, 4:09 PM

## 2013-08-09 NOTE — Progress Notes (Signed)
Patient's blood pressure elevated at 169/77, heartrate 74.  Patient asymptomatic.  [place]e [place], PA notified; no new orders received.  Will continue to monitor.

## 2013-08-09 NOTE — Progress Notes (Signed)
Physical Therapy Note  Patient Details  Name: HAIZEL GATCHELL MRN: [id number] Date of Birth: 1934/09/29 Today's Date: 08/09/2013 1325-1345, 20 min individual therapy No pain reported Cervical collar +2 tx for skilled assistance for stand/pivot transfers  Therapeutic activity sit> stand repeatedly with +2 assist after set up for optimal foot placement-  focusing on terminal hip extension, bil stance control at knees using RW, with assistance for placement of R and L feet.  Stand> sit repeatedly working on eccentric control hip and knee extensors.  Pt tends to hyperextend R knee and has difficulty "unlocking" it during sitting.  Stand/step transfer with RW mat> w/c to L +2 assistance, focusing on wider stance, wt shifting, RLE stance control.  Pt's ability to sense RLE foot position in standing is impaired, plus she is unable to visualize her feet due to her girth.  Ransome Helwig 08/09/2013, 4:48 PM

## 2013-08-10 ENCOUNTER — Inpatient Hospital Stay: Payer: Medicare Other | Admitting: Infectious Disease

## 2013-08-10 ENCOUNTER — Inpatient Hospital Stay (HOSPITAL_COMMUNITY): Payer: PRIVATE HEALTH INSURANCE

## 2013-08-10 ENCOUNTER — Inpatient Hospital Stay (HOSPITAL_COMMUNITY): Payer: Medicare Other | Admitting: Physical Therapy

## 2013-08-10 ENCOUNTER — Inpatient Hospital Stay (HOSPITAL_COMMUNITY): Payer: PRIVATE HEALTH INSURANCE | Admitting: Occupational Therapy

## 2013-08-10 DIAGNOSIS — G061 Intraspinal abscess and granuloma: Secondary | ICD-10-CM

## 2013-08-10 DIAGNOSIS — G8250 Quadriplegia, unspecified: Secondary | ICD-10-CM

## 2013-08-10 MED ORDER — BETHANECHOL CHLORIDE 25 MG PO TABS
25.0000 mg | ORAL_TABLET | Freq: Three times a day (TID) | ORAL | Status: DC
  Administered 2013-08-10 – 2013-08-14 (×12): 25 mg via ORAL
  Filled 2013-08-10 (×15): qty 1

## 2013-08-10 NOTE — Patient Care Conference (Signed)
Inpatient RehabilitationTeam Conference and Plan of Care Update Date: 08/10/2013   Time: 9:55 AM    Patient Name: [name]      Medical Record Number: [id number]  Date of Birth: 11/26/1934 Sex: Female         Room/Bed: 4W25C/4W25C-01 Payor Info: Payor: [employer] MEDICARE / Plan: AARP MEDICARE COMPLETE / Product Type: *No Product type* /    Admitting Diagnosis: other  Admit Date/Time:  07/31/2013  5:38 PM Admission Comments: No comment available   Primary Diagnosis:  <principal problem not specified> Principal Problem: <principal problem not specified>  Patient Active Problem List   Diagnosis Date Noted  . Abscess in epidural space of cervical spine 07/30/2013  . Epidural abscess (embolic) of spinal cord 40/98/1191  . Epidural abscess 07/22/2013  . Spinal epidural abscess 07/16/2013  . Acute respiratory failure 07/16/2013  . OSA on CPAP 07/16/2013    Expected Discharge Date: Expected Discharge Date: 08/28/13 (SNF)  Team Members Present: Physician leading conference: Dr. Alger Simons Social Worker Present: Alfonse Alpers, LCSW Nurse Present: Rayetta Humphrey, RN PT Present: Canary Brim, Rayburn Ma, PT OT Present: Chrys Racer, Starling Manns, OT;Frank [place], OT PPS Coordinator present : Ileana Ladd, PT     Current Status/Progress Goal Weekly Team Focus  Medical   improving neuro function but still with sensory deficits. urine retention  improve bladder emptying and functional use of hands  bladder function, anxiety control, neck precautions   Bowel/Bladder   Continent of bowel, LBM 3/30 x2; up to bathroom to void, required in and out cath after voiding on toilet  Continent of bowel and bladder  Continue to get patient out of bed to void, check PVRs   Swallow/Nutrition/ Hydration             ADL's   Min A UB ADL, Max A LB ADL, Mod - Max A SPT, Max A toileting  Overall Min Assist   Sit >< stand, dynamic sitting balance, UE strengthening, transfer  training, bed mobiltiy, endurance   Mobility   +2 assist for safety, min/mod assist transfers. Ambulation with lift equipment  min transfers, mod with chair to follow for gait  Sit <> stands, standing balance, transfer, endurance, NMR, strengthening    Communication             Safety/Cognition/ Behavioral Observations            Pain   Complains of neck/right shoulder pain, managed with scheduled neurontin and PRN percocet 2 tabs  Pain </=4  Offer pain medication prior to therapy, assist to reposition in wheelchair q1hr   Skin   Incision to neck/back with stained honeycomb dressing in place, blanchable redness to sacrum/buttocks with allevyn intact  No new skin breakdown  Reposition q1hr in wheelchair, turn patient q2hr in bed    Rehab Goals Patient on target to meet rehab goals: Yes Rehab Goals Revised: None *See Care Plan and progress notes for long and short-term goals.  Barriers to Discharge: size, neuro deficits    Possible Resolutions to Barriers:  assist at discharge    Discharge Planning/Teaching Needs:  home with family to assist vs possible SNF dependent on progress  Family can be available for family education as needed.   Team Discussion:  Pt is making gradual progress and her right hand is regaining function. Her foley catheter is out and her pain and anxiety is decreasing.  Bathing and toileting remain difficult for pt and OT will continue to work on these  tasks.  Pt's sit to stand is much better and she has walked using the maxi sky.  Pt is still contemplating SNF vs. going home with family support.  Revisions to Treatment Plan:  None   Continued Need for Acute Rehabilitation Level of Care: The patient requires daily medical management by a physician with specialized training in physical medicine and rehabilitation for the following conditions: Daily direction of a multidisciplinary physical rehabilitation program to ensure safe treatment while eliciting the  highest outcome that is of practical value to the patient.: Yes Daily medical management of patient stability for increased activity during participation in an intensive rehabilitation regime.: Yes Daily analysis of laboratory values and/or radiology reports with any subsequent need for medication adjustment of medical intervention for : Neurological problems;Post surgical problems;Other  [name], [name] 08/10/2013, 1:30 PM

## 2013-08-10 NOTE — Progress Notes (Signed)
[name] PHYSICAL MEDICINE & REHABILITATION     PROGRESS NOTE    Subjective/Complaints: Seemed to be emptying better but still with retention. No problems with urecholine. Asked how long she'll have to wear collar. A 12 point review of systems has been performed and if not noted above is otherwise negative.   Objective: Vital Signs: Blood pressure 136/73, pulse 76, temperature 97.9 F (36.6 C), temperature source Oral, resp. rate 18, SpO2 95.00%. No results found. No results found for this basename: WBC, HGB, HCT, PLT,  in the last 72 hours No results found for this basename: NA, K, CL, CO, GLUCOSE, BUN, CREATININE, CALCIUM,  in the last 72 hours CBG (last 3)   Recent Labs  08/09/13 2043  GLUCAP 147*    Wt Readings from Last 3 Encounters:  07/30/13 111.131 kg (245 lb)  07/30/13 111.131 kg (245 lb)  07/20/13 79.561 kg (175 lb 6.4 oz)    Physical Exam:  Constitutional:  79 year old obese female, no distress. alert  HENT: oral mucosa pink and moist  Head: Normocephalic.  Eyes: EOM are normal.  Neck: Normal range of motion. Neck supple. No thyromegaly present.  Cardiovascular: Normal rate and regular rhythm. No murmurs, rubs, or gallops  Respiratory: Effort normal and breath sounds normal. No respiratory distress.  GI: Soft. Bowel sounds are normal. She exhibits no distension.  Neurological:  patient is alert and a bit anxious. She is oriented x3 followed basic commands. RUE   3+ deltoid, bicep, triceps 3, WE 3+, HI almost 3, LUE 3 to 3+ deltoid, bicep, tricep, wrist and HI. LE: RHF, KE and ankle 2+ to 3+/5. LLE 2/5 proximal to distal. Decreased PP and LT/proprioception all 4's, lower more than upper, right more than left. DTR's 1+  Skin: Surgical site clean and dry without swelling Psych: pleasant, cooperative, NON-anxious today  Assessment/Plan: 1. Functional deficits secondary to cervical epidural abscess with myelopathy s/p C4-7 laminectomy and fusion C3-T1, which  require 3+ hours per day of interdisciplinary therapy in a comprehensive inpatient rehab setting. Physiatrist is providing close team supervision and 24 hour management of active medical problems listed below. Physiatrist and rehab team continue to assess barriers to discharge/monitor patient progress toward functional and medical goals. FIM: FIM - Bathing Bathing Steps Patient Completed: Chest;Left Arm;Abdomen;Front perineal area;Right upper leg;Left upper leg;Right lower leg (including foot);Left lower leg (including foot) (Long sitting at edge of bed (alt hurdler's stretch position)) Bathing: 3: Mod-Patient completes 5-7 53f 10 parts or 50-74%  FIM - Upper Body Dressing/Undressing Upper body dressing/undressing steps patient completed: Thread/unthread right sleeve of pullover shirt/dresss;Thread/unthread left sleeve of pullover shirt/dress;Put head through opening of pull over shirt/dress Upper body dressing/undressing: 4: Min-Patient completed 75 plus % of tasks FIM - Lower Body Dressing/Undressing Lower body dressing/undressing steps patient completed: Thread/unthread right pants leg;Thread/unthread left pants leg Lower body dressing/undressing: 2: Max-Patient completed 25-49% of tasks  FIM - Toileting Toileting steps completed by patient: Adjust clothing prior to toileting Toileting Assistive Devices: Grab bar or rail for support Toileting: 1: Two helpers  FIM - Radio producer Devices: Product manager Transfers: 1-Mechanical lift;1-Two helpers (with use of steady lift)  FIM - Control and instrumentation engineer Devices: Environmental consultant;Arm rests Bed/Chair Transfer: 1: Two helpers  FIM - Locomotion: Wheelchair Locomotion: Wheelchair: 0: Activity did not occur FIM - Locomotion: Ambulation Locomotion: Ambulation Assistive Devices: MaxiSky Ambulation/Gait Assistance: 1: +2 Total assist Locomotion: Ambulation: 1: Two  helpers  Comprehension Comprehension Mode: Auditory Comprehension: 7-Follows complex conversation/direction:  With no assist  Expression Expression Mode: Verbal Expression: 6-Expresses complex ideas: With extra time/assistive device  Social Interaction Social Interaction: 6-Interacts appropriately with others with medication or extra time (anti-anxiety, antidepressant).  Problem Solving Problem Solving: 5-Solves basic problems: With no assist  Memory Memory: 5-Requires cues to use assistive device  Medical Problem List and Plan:  1. Cervical epidural abscess with tetraplegia. Status post anterior cervical decompression discectomy multilevel C4-C7 07/16/2013 with subsequent cord compression undergoing cervical 4-7 posterior cervical fusion, C4-C7 cervical laminectomy and C3-T1 levels 07/28/2013  2. DVT Prophylaxis/Anticoagulation: SCDs.   lovenox 30 q12. Dopplers negative 3. Pain Management: Neurontin 100 mg 3 times a day, Hydrocodone and Robaxin as needed. Monitor with increased mobility  4.ID/microaerophilic streptococci. Intravenous ceftriaxone through 08/26/2013  5. Neuropsych: This patient is capable of making decisions on her own behalf.  -low dose valium prn for anxiety (limit as much as possible) --discussed with patient 6. Hypertension. Lisinopril 5 mg daily, Lasix 20 mg daily.  7. Asthma. Continue Dulera puffs twice daily as well as Flonase/CPAP  8. Neurogenic bladder/bowel.    -VRE in urine---will not treat given multi-resistance-  -continue voiding trial. Increase urecholine to 25mg  tid  -moving bowels 9. Obesity. History of gastric bypass  10. Insomnia. Trazodone 50 mg each bedtime  LOS (Days) 10 A FACE TO FACE EVALUATION WAS PERFORMED  [name] T 08/10/2013 9:27 AM

## 2013-08-10 NOTE — Progress Notes (Signed)
Occupational Therapy Session Note  Patient Details  Name: [name] MRN: [id number] Date of Birth: [birth year]  Today's Date: 08/10/2013 Time: 8850-2774 Time Calculation (min): 45 min  Short Term Goals: Week 2:  OT Short Term Goal 1 (Week 2): Patient will complete lower body dressing with mod assist OT Short Term Goal 2 (Week 2): Patient will perform tranfer to tub bench with mod assist OT Short Term Goal 3 (Week 2): Patient will complete upper body bathing with setup OT Short Term Goal 4 (Week 2): Patient will complete upper body dressing with min assist OT Short Term Goal 5 (Week 2): Patient will maintain supported static standing balance during assisted ADL with min assist  Skilled Therapeutic Interventions/Progress Updates:  Patient seated on toilet upon arrival with Denna Haggard in front of her and her son, Yvone Neu at her side having just assisted her to the toilet.  Patient was total assist for toileting tasks however she assisted with the task by pulling to stand with the Denna Haggard and remained standing for toileting and clothing management to be completed.  Patient remained seated in Denna Haggard to improve trunk control during bilateral shoulder A/AAROM exercises.  Once seated in w/c, addressed BUE gross and fine motor coordination and strengthening to include Theraputty exercises and educated on the need to exercise and use her hands as much as possible to maintain the arches in the palm.  Therapy Documentation Precautions:  Precautions Precautions: Cervical;Fall Precaution Comments: Parasthesias Required Braces or Orthoses: Cervical Brace Cervical Brace: Hard collar;At all times Restrictions Weight Bearing Restrictions: No Other Position/Activity Restrictions: no lifting, pushing, or heaving lifting Pain: Denies pain See FIM for current functional status  Therapy/Group: Individual Therapy  Lovada Barwick, [place] 08/10/2013, 2:47 PM

## 2013-08-10 NOTE — Progress Notes (Signed)
Social Work Patient ID: [name], female   DOB: [birth year], 79 y.o.   MRN: [id number]  CSW met with pt to update her on team conference.  Pt agrees that she is seeing gradual progress and is feeling less pain, although still some and less anxiety.  Spoke with pt's son also, to update him.  Pt is still weighing the idea of SNF vs. Home with family support, depending on her progress.  Team feels pt's biggest challenges are bathing and toileting and pt agrees with team.  Therapists will continue to address these needs. CSW will continue to follow along with pt and assist her with d/c needs.

## 2013-08-10 NOTE — Progress Notes (Signed)
Physical Therapy Session Note  Patient Details  Name: [name] MRN: [id number] Date of Birth: 03-Feb-1935  Today's Date: 08/10/2013 Time: 6967-8938 Time Calculation (min): 56 min  Skilled Therapeutic Interventions/Progress Updates:    Session focused on sit <> stand transfers and ambulation without lift equipment. Sit <> stands from w/c performed with min assist to RW x 6 reps. Forward foot taps working on single limb stance stability. Ambulation 2 x 15' with +2 assist (mod/max assist overall) with RW, visual feedback from long mirror, and orange line on floor as reference to avoid Rt LE adduction. Additional person utilized for w/c behind. Pt pleased with progressed but expressed frustration over difficulty placing feet appropriately.   Bil hyperextension also remains an issue and demonstrates poor bil LE proprioception and global weakness. Bil LE manually resisted exercises seated in chair: knee flexion/extension, hip abduction, hip flexion.   Pressure relief cushion placed in chair d/t pt c/o buttock soreness, pt educated on lateral leans every 20-30 min for pressure relief.   Second Session Time:  1430-1500 Time Calculation (min): 30 min Skilled Therapeutic Interventions/Progress Updates:  Supine>sit with extended time as pt strengthening core in attempt to transition to sitting, PT facilitating weight shift and appropriate musculature. Sit <> stand from bed with min assist however up to mod assist needed for standing balance due to significantly impaired balance reactions. Mini squats 3 x 5 reps with min/mod assist (balance) and RW. Lateral stepping along bed with mod assist and RW, pt able to take 2 lateral steps then needs assisted seat to bed due to narrow base and poor balance reactions. Difficulty such as this is why pt is unable to safely attempt stand pivot transfers yet and must rely on scooting or lift equipment until further progression.   Seated target taps with LEs for  proprioceptive practice during rest breaks.   Pt had been out of bed since early this morning, was very fatigued. Pt agreeable to rest in bed until supper then get into chair with nursing - NT made aware.   Therapy Documentation Precautions:  Precautions Precautions: Cervical;Fall Precaution Comments: Parasthesias Required Braces or Orthoses: Cervical Brace Cervical Brace: Hard collar;At all times Restrictions Weight Bearing Restrictions: No Other Position/Activity Restrictions: no lifting, pushing, or heaving lifting Pain: Pain Assessment Pain Assessment: No/denies pain first session. Second session some Rt shoulder pain - RN called for medication.  See FIM for current functional status  Therapy/Group: Individual Therapy both sessions  [name] 08/10/2013, 11:15 AM

## 2013-08-10 NOTE — Progress Notes (Signed)
Occupational Therapy Session Note  Patient Details  Name: [name] MRN: [id number] Date of Birth: 11/01/1934  Today's Date: 08/10/2013 Time: 0730-0830 Time Calculation (min): 60 min  Short Term Goals: Week 2:  OT Short Term Goal 1 (Week 2): Patient will complete lower body dressing with mod assist OT Short Term Goal 2 (Week 2): Patient will perform tranfer to tub bench with mod assist OT Short Term Goal 3 (Week 2): Patient will complete upper body bathing with setup OT Short Term Goal 4 (Week 2): Patient will complete upper body dressing with min assist OT Short Term Goal 5 (Week 2): Patient will maintain supported static standing balance during assisted ADL with min assist  Skilled Therapeutic Interventions/Progress Updates: ADL-retraining with focus on adapted bathing/dressing skills, sit >< stand, and dynamic sitting balance.   Patient received supine in bed and motivated to progress through ADL in prep for visiting with her son.   Patient performed modified bathing this session, bathing peri-area while supine in bed and rising from supine to sitting at edge of bed to bathe legs and feet using modified hurdler's stretch position.  With min assist, patient lifted right leg up to extend knee and flex hip with left foot supported on floor.   Patient was able to reach to wash her feet, lower leg and upper leg unassisted and assumed similar position (with mod assist to maintain sitting balance) to wash left leg.   Patient required mech lift assist to w/c Denna Haggard) due to time limitations and performed seated upper body bathing and dressing with min assist to pull shirt over trunk.   Patient remained in w/c to complete self-feeding with only setup assist; call light and phone placed within reach.    Therapy Documentation Precautions:  Precautions Precautions: Cervical;Fall Precaution Comments: Parasthesias Required Braces or Orthoses: Cervical Brace Cervical Brace: Hard collar;At all  times Restrictions Weight Bearing Restrictions: No Other Position/Activity Restrictions: no lifting, pushing, or heaving lifting  Vital Signs: Therapy Vitals Temp: 98.1 F (36.7 C) Temp src: Oral Pulse Rate: 87 Resp: 16 BP: 171/86 mmHg Patient Position, if appropriate: Lying Oxygen Therapy SpO2: 92 % O2 Device: None (Room air)  Pain: No/denies pain     Therapy/Group: Co-Treatment  Malayna Noori 08/10/2013, 3:35 PM

## 2013-08-10 NOTE — Op Note (Signed)
This is to serve as an addendum to 07/28/13 Op Note.  I used a bone mill to morcelize laminectomy bone and then placed it from C 4 - T 1 levels overlying the lateral masses for the purpose of posterolateral arthrodesis.

## 2013-08-11 ENCOUNTER — Inpatient Hospital Stay (HOSPITAL_COMMUNITY): Payer: Medicare Other | Admitting: Physical Therapy

## 2013-08-11 ENCOUNTER — Inpatient Hospital Stay (HOSPITAL_COMMUNITY): Payer: PRIVATE HEALTH INSURANCE

## 2013-08-11 DIAGNOSIS — G8250 Quadriplegia, unspecified: Secondary | ICD-10-CM

## 2013-08-11 DIAGNOSIS — G061 Intraspinal abscess and granuloma: Secondary | ICD-10-CM

## 2013-08-11 MED ORDER — LISINOPRIL 10 MG PO TABS
10.0000 mg | ORAL_TABLET | Freq: Every day | ORAL | Status: DC
  Administered 2013-08-12 – 2013-08-26 (×15): 10 mg via ORAL
  Filled 2013-08-11 (×16): qty 1

## 2013-08-11 NOTE — Progress Notes (Signed)
[facility]ILIT[facility]     PROGRESS NOTE    Subjective/Complaints: Had a good day. Still not able to empty but has had the urge. Pain controlled. A 12 point review of systems has been performed and if not noted above is otherwise negative.   Objective: Vital Signs: Blood pressure 157/80, pulse 74, temperature 97.6 F (36.4 C), temperature source Oral, resp. rate 18, weight 109.77 kg (242 lb), SpO2 96.00%. No results found. No results found for this basename: WBC, HGB, HCT, PLT,  in the last 72 hours No results found for this basename: NA, K, CL, CO, GLUCOSE, BUN, CREATININE, CALCIUM,  in the last 72 hours CBG (last 3)   Recent Labs  08/09/13 2043  GLUCAP 147*    Wt Readings from Last 3 Encounters:  08/11/13 109.77 kg (242 lb)  07/30/13 111.131 kg (245 lb)  07/30/13 111.131 kg (245 lb)    Physical Exam:  Constitutional:  79 year old obese female, no distress. alert  HENT: oral mucosa pink and moist  Head: Normocephalic.  Eyes: EOM are normal.  Neck: Normal range of motion. Neck supple. No thyromegaly present.  Cardiovascular: Normal rate and regular rhythm. No murmurs, rubs, or gallops  Respiratory: Effort normal and breath sounds normal. No respiratory distress.  GI: Soft. Bowel sounds are normal. She exhibits no distension.  Neurological:  patient is alert and a bit anxious. She is oriented x3 followed basic commands. RUE   3+ deltoid, bicep, triceps 3, WE 3+, HI almost 3, LUE 3 to 3+ deltoid, bicep, tricep, wrist and HI. LE: RHF, KE and ankle 2+ to 3+/5. LLE 2/5 proximal to distal. Decreased PP and LT/proprioception all 4's, lower more than upper, right more than left. DTR's 1+  Skin: Surgical site clean and dry without swelling Psych: pleasant, cooperative, NON-anxious today  Assessment/Plan: 1. Functional deficits secondary to cervical epidural abscess with myelopathy s/p C4-7 laminectomy and fusion C3-T1, which require 3+ hours per day of  interdisciplinary therapy in a comprehensive inpatient rehab setting. Physiatrist is providing close team supervision and 24 hour management of active medical problems listed below. Physiatrist and rehab team continue to assess barriers to discharge/monitor patient progress toward functional and medical goals. FIM: FIM - Bathing Bathing Steps Patient Completed: Chest;Right Arm;Left Arm;Abdomen;Right upper leg;Left upper leg;Right lower leg (including foot);Left lower leg (including foot) Bathing: 4: Min-Patient completes 8-9 72f 10 parts or 75+ percent  FIM - Upper Body Dressing/Undressing Upper body dressing/undressing steps patient completed: Thread/unthread right bra strap;Thread/unthread left bra strap;Thread/unthread right sleeve of pullover shirt/dresss;Thread/unthread left sleeve of pullover shirt/dress;Pull shirt over trunk Upper body dressing/undressing: 4: Min-Patient completed 75 plus % of tasks FIM - Lower Body Dressing/Undressing Lower body dressing/undressing steps patient completed: Thread/unthread right pants leg;Thread/unthread left pants leg Lower body dressing/undressing: 2: Max-Patient completed 25-49% of tasks  FIM - Toileting Toileting steps completed by patient: Adjust clothing prior to toileting Toileting Assistive Devices: Grab bar or rail for support Toileting: 1: Two helpers  FIM - Radio producer Devices: Product manager Transfers: 1-Mechanical lift;1-Two helpers (with use of steady lift)  FIM - Control and instrumentation engineer Devices: Environmental consultant;Bed rails;HOB elevated Bed/Chair Transfer: 4: Supine > Sit: Min A (steadying Pt. > 75%/lift 1 leg);4: Bed > Chair or W/C: Min A (steadying Pt. > 75%)  FIM - Locomotion: Wheelchair Locomotion: Wheelchair: 0: Activity did not occur FIM - Locomotion: Ambulation Locomotion: Ambulation Assistive Devices: MaxiSky Ambulation/Gait Assistance: 1: +2 Total assist Locomotion:  Ambulation: 1:  Two helpers  Comprehension Comprehension Mode: Auditory Comprehension: 6-Follows complex conversation/direction: With extra time/assistive device  Expression Expression Mode: Verbal Expression: 6-Expresses complex ideas: With extra time/assistive device  Social Interaction Social Interaction: 6-Interacts appropriately with others with medication or extra time (anti-anxiety, antidepressant).  Problem Solving Problem Solving: 5-Solves basic problems: With no assist  Memory Memory: 5-Requires cues to use assistive device  Medical Problem List and Plan:  1. Cervical epidural abscess with tetraplegia. Status post anterior cervical decompression discectomy multilevel C4-C7 07/16/2013 with subsequent cord compression undergoing cervical 4-7 posterior cervical fusion, C4-C7 cervical laminectomy and C3-T1 levels 07/28/2013  2. DVT Prophylaxis/Anticoagulation: SCDs.   lovenox 30 q12. Dopplers negative 3. Pain Management: Neurontin 100 mg 3 times a day, Hydrocodone and Robaxin as needed. Monitor with increased mobility  4.ID/microaerophilic streptococci. Intravenous ceftriaxone through 08/26/2013  5. Neuropsych: This patient is capable of making decisions on her own behalf.  -low dose valium prn for anxiety (limit as much as possible) --discussed with patient 6. Hypertension. Lisinopril 5 mg daily---increase to 10mg , Lasix 20 mg daily.  7. Asthma. Continue Dulera puffs twice daily as well as Flonase/CPAP  8. Neurogenic bladder/bowel.    -VRE in urine---will not treat given multi-resistance-  -continue voiding trial. maintain urecholine at 25mg  tid  -moving bowels 9. Obesity. History of gastric bypass  10. Insomnia. Trazodone 50 mg each bedtime  LOS (Days) 11 A FACE TO FACE EVALUATION WAS PERFORMED  SWARTZ,ZACHARY T 08/11/2013 10:15 AM

## 2013-08-11 NOTE — Progress Notes (Signed)
Occupational Therapy Session Note  Patient Details  Name: [name] MRN: [id number] Date of Birth: 1935-02-04  Today's Date: 08/11/2013 Time: 0830-0928 Time Calculation (min): 58 min  Short Term Goals: Week 2:  OT Short Term Goal 1 (Week 2): Patient will complete lower body dressing with mod assist OT Short Term Goal 2 (Week 2): Patient will perform tranfer to tub bench with mod assist OT Short Term Goal 3 (Week 2): Patient will complete upper body bathing with setup OT Short Term Goal 4 (Week 2): Patient will complete upper body dressing with min assist OT Short Term Goal 5 (Week 2): Patient will maintain supported static standing balance during assisted ADL with min assist  Skilled Therapeutic Interventions/Progress Updates: ADL-retraining with focus on bathing and dressing at edge of bed, static standing balance, sit >< stand, and transfers.   Patient able to complete bed mobility to rise from supine to sitting at edge of bed with only min assist, using bed rails and HOB elevated.   Patient reported prior assist with cleansing buttocks and peri-area after toileting, deferring bath for both this session.   Patient performed seated bathing, upper and lower body with setup and intermittent mod assist to lift right and left leg into position on bed ala "hurdler's stretch."   Patient performed upper body dressing with min assist and lower body, lacing left and right leg in pants this session with mod assist to don socks and shoes and to pull up pants while patient maintained static standing balance at RW.   Patient complete stand-pivot transfer to w/c with steadying assist (min) and remained in w/c with call light and phones placed within reach.    Therapy Documentation Precautions:  Precautions Precautions: Cervical;Fall Precaution Comments: Parasthesias Required Braces or Orthoses: Cervical Brace Cervical Brace: Hard collar;At all times Restrictions Weight Bearing Restrictions:  No Other Position/Activity Restrictions: no lifting, pushing, or heaving lifting  Vital Signs: Therapy Vitals Temp: 97.6 F (36.4 C) Temp src: Oral Pulse Rate: 74 Resp: 18 BP: 157/80 mmHg Patient Position, if appropriate: Lying Oxygen Therapy SpO2: 96 % O2 Device: None (Room air)  Pain: Pain Assessment Pain Assessment: 0-10 Pain Score: 5  Pain Type: Acute pain Pain Location: Shoulder Pain Orientation: Left Pain Descriptors / Indicators: Aching Pain Frequency: Intermittent Pain Onset: Gradual Patients Stated Pain Goal: 3 Pain Intervention(s): Medication (See eMAR);Repositioned;Distraction Multiple Pain Sites: No  See FIM for current functional status  Therapy/Group: Individual Therapy  Second session: Time: 1352-1430 Time Calculation (min):  38 min  Pain Assessment: No/denies pain  Skilled Therapeutic Interventions: Therapeutic activity with focus on improve general strengthening and endurance using NuStep and stand-pivot transfer.   Patient received in recliner, receptive for continued treatment.   Patient completed stand -pivot transfer from recliner to w/c with only steadying (min) assist and verbal cues for positioning of right foot and transfer technique.  Patient escorted to gym where she completed similar transfer on and off NuStep.   Patient performed seated exercise (BU/LE) for 10 min, level 1, 603 total step, arm position 9, leg position 10, using right foot strap.   Patient was escorted back to her room but was unable to complete safe stand pivot transfer back to bed d/t right knee buckling and required use of Denna Haggard (mech lift) instead due to fatigue.   See FIM for current functional status  Therapy/Group: Individual Therapy  Yailyn Strack 08/11/2013, 9:28 AM

## 2013-08-11 NOTE — Progress Notes (Signed)
Physical Therapy Session Note  Patient Details  Name: [name] MRN: [id number] Date of Birth: Dec 18, 1934  Today's Date: 08/11/2013 Time: 1000-1100 Time Calculation (min): 60 min  Short Term Goals: Week 2:  PT Short Term Goal 1 (Week 2): Pt will be able to complete transfers from various surfaces with min A to promote increased functional mobility PT Short Term Goal 2 (Week 2): Pt will be able to propel wheel chair 100 feet with supervision PT Short Term Goal 3 (Week 2): Pt will be able to ambulate 25-50 feet using the LRAD with min A PT Short Term Goal 4 (Week 2): Pt will be able to negotiate up/down 4 steps using bilateral railings with mod A.   Skilled Therapeutic Interventions/Progress Updates:    No c/o pain. Supine>sit min assist and increased time, demonstrating good improvements of strength. Stand pivot bed>w/c with +2 for safety mod assist overall (still adducting LEs creating intermittent LOB). Forward foot taps to small step working single limb stance stability, PT providing facilitation of Rt knee to avoid hyperextension and buckling. Carry over to ambulation 2 x 16' with bariatric RW and +2 assist mod/max assist overall (max assist at end due to LOB). Utilized line on floor and mirror feedback to decrease adduction.  Pt currently min assist for sit <> stands from w/c. Encouraged to sit in recliner until end of therapy today - rest - then get back in chair for supper.   Second Session Time:  6440-3474 Time Calculation (min): 28 min Skilled Therapeutic Interventions/Progress Updates:  No c/o pain - Seated in recliner "my buttocks is tired" PT reviewed pressure relief. Pt may benefit from standing with nursing every hour to relieve pressure - pt verbalized understaning.  - Sit <> stands from recliner:  - mini squats in standing 3 x 10 reps for strengthening  - single limb stance activities x 2 stability activity min/mod assist for balance     Therapy  Documentation Precautions:  Precautions Precautions: Cervical;Fall Precaution Comments: Parasthesias Required Braces or Orthoses: Cervical Brace Cervical Brace: Hard collar;At all times Restrictions Weight Bearing Restrictions: No Other Position/Activity Restrictions: no lifting, pushing, or heaving lifting   See FIM for current functional status  Therapy/Group: Individual Therapy both sessions  [name] 08/11/2013, 12:19 PM

## 2013-08-12 ENCOUNTER — Inpatient Hospital Stay (HOSPITAL_COMMUNITY): Payer: PRIVATE HEALTH INSURANCE | Admitting: *Deleted

## 2013-08-12 ENCOUNTER — Inpatient Hospital Stay (HOSPITAL_COMMUNITY): Payer: PRIVATE HEALTH INSURANCE

## 2013-08-12 ENCOUNTER — Inpatient Hospital Stay (HOSPITAL_COMMUNITY): Payer: Medicare Other

## 2013-08-12 DIAGNOSIS — G061 Intraspinal abscess and granuloma: Secondary | ICD-10-CM

## 2013-08-12 DIAGNOSIS — G8250 Quadriplegia, unspecified: Secondary | ICD-10-CM

## 2013-08-12 NOTE — Progress Notes (Signed)
Physical Therapy Session Note  Patient Details  Name: [name] MRN: [id number] Date of Birth: 11/26/1934  Today's Date: 08/12/2013 Time: 10:05-11:00 (92mn) and 13:00-13:45 (451m)   Short Term Goals: Week 1:  PT Short Term Goal 1 (Week 1): Pt will be able to complete transfers from bed<>chair with mod A.  PT Short Term Goal 1 - Progress (Week 1): Met PT Short Term Goal 2 (Week 1): Pt will be able to propel wheel chair 25' feet with mod A.  PT Short Term Goal 2 - Progress (Week 1): Progressing toward goal PT Short Term Goal 3 (Week 1): Pt will be able to ambulate 5 feet using the LRAD with +2 total assist PT Short Term Goal 3 - Progress (Week 1): Partly met Week 2:  PT Short Term Goal 1 (Week 2): Pt will be able to complete transfers from various surfaces with min A to promote increased functional mobility PT Short Term Goal 2 (Week 2): Pt will be able to propel wheel chair 100 feet with supervision PT Short Term Goal 3 (Week 2): Pt will be able to ambulate 25-50 feet using the LRAD with min A PT Short Term Goal 4 (Week 2): Pt will be able to negotiate up/down 4 steps using bilateral railings with mod A.   Skilled Therapeutic Interventions/Progress Updates:   1:2 Tx focused on LE strengthening, gait with RW and functional mobility. Stregthening focusing on RLE for extenson control especially, as well as activity tolerance in sitting and standing.   Performed LE strengthening in [place] [id number] bil LEs  Gait 2x25' with RW and mod A for steadying/facilitating R quad; +2 for close WC follow and mirror for visual feedback. RLE began to buckle after 15' and pt continues to have difficulty keeping safe stance width. Pt tends to sway too far outside BOS and needs cues to push UEs through RW for trunk support.   Performed standing therex at tall table with heel raises, mini squats and hip ABD 2x10 each with assist for trunk support and manual facilitation for full hip ext and R knee  control to limit bucking and hyperextension. Pt needed sitting rest between each task.   Pt propelled WC x40' with bil UEs but was limited by fatigue, needing verbal cues for efficient technique.   2:2  Tx focused on dynamic sitting and standing balance, transfers, and gait with RW.  Pt propelled WC x40' with Min A for turning.  Mod A WC<>mat transfer with RW and cues for safe sequence and assist at R knee for safety.   Performed dynamic sitting and standing balance from mat with UE support of RW in standing for reaching/placing task to increase RLE weight-shift and knee control. Pt needing up to max A for balance, and was very fearful to move outside BOS. Pt needed seated rest after 4 reps of [id number] to fatigue from exertion. Performed LAQ with 5 sec holds and marching during rest breaks. Pt had most difficulty reaching forwards and upwards, as well as across midline.   Gait x15' with RW and +2 as before.  Pt returned to room with lap belt, awaiting OT.   Therapy Documentation Precautions:  Precautions Precautions: Cervical;Fall Precaution Comments: Parasthesias Required Braces or Orthoses: Cervical Brace Cervical Brace: Hard collar;At all times Restrictions Weight Bearing Restrictions: No Other Position/Activity Restrictions: no lifting, pushing, or heaving lifting    Pain: none      Locomotion : Ambulation Ambulation/Gait Assistance: 1: +2 Total assist  See FIM for current functional status  Therapy/Group: Individual Therapy Kennieth Rad, PT, DPT   08/12/2013, 10:48 AM

## 2013-08-12 NOTE — Progress Notes (Signed)
[place] PHYSICAL MEDICINE & REHABILITATION     PROGRESS NOTE    Subjective/Complaints: Had a good day. Still not able to empty but has had the urge. Pain controlled. A 12 point review of systems has been performed and if not noted above is otherwise negative.   Objective: Vital Signs: Blood pressure 147/76, pulse 76, temperature 98 F (36.7 C), temperature source Oral, resp. rate 18, weight 109.77 kg (242 lb), SpO2 97.00%. No results found. No results found for this basename: WBC, HGB, HCT, PLT,  in the last 72 hours No results found for this basename: NA, K, CL, CO, GLUCOSE, BUN, CREATININE, CALCIUM,  in the last 72 hours CBG (last 3)   Recent Labs  08/09/13 2043  GLUCAP 147*    Wt Readings from Last 3 Encounters:  08/11/13 109.77 kg (242 lb)  07/30/13 111.131 kg (245 lb)  07/30/13 111.131 kg (245 lb)    Physical Exam:  Constitutional:  79 year old obese female, no distress. alert  HENT: oral mucosa pink and moist  Head: Normocephalic.  Eyes: EOM are normal.  Neck: Normal range of motion. Neck supple. No thyromegaly present.  Cardiovascular: Normal rate and regular rhythm. No murmurs, rubs, or gallops  Respiratory: Effort normal and breath sounds normal. No respiratory distress.  GI: Soft. Bowel sounds are normal. She exhibits no distension.  Neurological:  patient is alert and a bit anxious. She is oriented x3 followed basic commands. RUE   3+ deltoid, bicep, triceps 3, WE 3+, HI almost 3, LUE 3 to 3+ deltoid, bicep, tricep, wrist and HI. LE: RHF, KE and ankle 2+ to 3+/5. LLE 2/5 proximal to distal. Decreased PP and LT/proprioception all 4's, lower more than upper, right more than left. DTR's 1+  Skin: Surgical site clean and dry without swelling, R paracervical drain site well healed  Psych: pleasant, cooperative, NON-anxious today  Assessment/Plan: 1. Functional deficits secondary to cervical epidural abscess with myelopathy s/p C4-7 laminectomy and fusion  C3-T1, which require 3+ hours per day of interdisciplinary therapy in a comprehensive inpatient rehab setting. Physiatrist is providing close team supervision and 24 hour management of active medical problems listed below. Physiatrist and rehab team continue to assess barriers to discharge/monitor patient progress toward functional and medical goals. FIM: FIM - Bathing Bathing Steps Patient Completed: Chest;Right Arm;Left Arm;Abdomen;Right upper leg;Left upper leg;Right lower leg (including foot);Left lower leg (including foot) Bathing: 4: Min-Patient completes 8-9 17f 10 parts or 75+ percent  FIM - Upper Body Dressing/Undressing Upper body dressing/undressing steps patient completed: Thread/unthread right bra strap;Thread/unthread left bra strap;Thread/unthread right sleeve of pullover shirt/dresss;Thread/unthread left sleeve of pullover shirt/dress;Pull shirt over trunk Upper body dressing/undressing: 4: Min-Patient completed 75 plus % of tasks FIM - Lower Body Dressing/Undressing Lower body dressing/undressing steps patient completed: Thread/unthread right pants leg;Thread/unthread left pants leg Lower body dressing/undressing: 2: Max-Patient completed 25-49% of tasks  FIM - Toileting Toileting steps completed by patient: Adjust clothing prior to toileting Toileting Assistive Devices: Grab bar or rail for support Toileting: 1: Two helpers  FIM - Radio producer Devices: Product manager Transfers: 1-Mechanical lift;1-Two helpers (with use of steady lift)  FIM - Control and instrumentation engineer Devices: Environmental consultant;Bed rails;HOB elevated Bed/Chair Transfer: 4: Supine > Sit: Min A (steadying Pt. > 75%/lift 1 leg);3: Bed > Chair or W/C: Mod A (lift or lower assist)  FIM - Locomotion: Wheelchair Locomotion: Wheelchair: 0: Activity did not occur FIM - Locomotion: Ambulation Locomotion: Ambulation Assistive Devices: Administrator Ambulation/Gait  Assistance: 1: +2 Total assist Locomotion: Ambulation: 1: Two helpers  Comprehension Comprehension Mode: Auditory Comprehension: 6-Follows complex conversation/direction: With extra time/assistive device  Expression Expression Mode: Verbal Expression: 6-Expresses complex ideas: With extra time/assistive device  Social Interaction Social Interaction: 6-Interacts appropriately with others with medication or extra time (anti-anxiety, antidepressant).  Problem Solving Problem Solving: 5-Solves basic problems: With no assist  Memory Memory: 5-Requires cues to use assistive device  Medical Problem List and Plan:  1. Cervical epidural abscess with tetraplegia. Status post anterior cervical decompression discectomy multilevel C4-C7 07/16/2013 with subsequent cord compression undergoing cervical 4-7 posterior cervical fusion, C4-C7 cervical laminectomy and C3-T1 levels 07/28/2013 , d/c suture from drain site 2. DVT Prophylaxis/Anticoagulation: SCDs.   lovenox 30 q12. Dopplers negative 3. Pain Management: Neurontin 100 mg 3 times a day, Hydrocodone and Robaxin as needed. Monitor with increased mobility  4.ID/microaerophilic streptococci. Intravenous ceftriaxone through 08/26/2013  5. Neuropsych: This patient is capable of making decisions on her own behalf.  -low dose valium prn for anxiety (limit as much as possible) --discussed with patient 6. Hypertension. Lisinopril 5 mg daily---increase to 10mg , Lasix 20 mg daily.  7. Asthma. Continue Dulera puffs twice daily as well as Flonase/CPAP  8. Neurogenic bladder/bowel.    -VRE in urine---will not treat given multi-resistance-  -continue voiding trial. maintain urecholine at 25mg  tid  -moving bowels 9. Obesity. History of gastric bypass  10. Insomnia. Trazodone 50 mg each bedtime  LOS (Days) 12 A FACE TO FACE EVALUATION WAS PERFORMED  [name] E 08/12/2013 9:12 AM

## 2013-08-12 NOTE — Progress Notes (Signed)
Occupational Therapy Session Note  Patient Details  Name: [name] MRN: [id number] Date of Birth: 08/31/1934  Today's Date: 08/12/2013 Time: 3154-008 Minutes: 60 minutes  Short Term Goals: Week 2:  OT Short Term Goal 1 (Week 2): Patient will complete lower body dressing with mod assist OT Short Term Goal 2 (Week 2): Patient will perform tranfer to tub bench with mod assist OT Short Term Goal 3 (Week 2): Patient will complete upper body bathing with setup OT Short Term Goal 4 (Week 2): Patient will complete upper body dressing with min assist OT Short Term Goal 5 (Week 2): Patient will maintain supported static standing balance during assisted ADL with min assist  Skilled Therapeutic Interventions/Progress Updates:  ADL-retraining with focus on seated bathing using tub bench in walk-in shower.   Patient received supine in bed ready for planned ADL.   Using Denna Haggard mech lift assist to expedite transfers in prep for bathing session, patient was transferred from edge of bed to bath bench with steadying assist during sit><stand from [facility].   Patient completed seated bathing with only min assist to wash feet and buttocks (standing in [place] after bathing session).   Patient attempted partial squat to wash peri-area during bathing but reported instability of feet on wet tile and deferred attempt to wash.   Patient completed upper body dressing with min assist to pull shirt over head/cervical collar.   Patient required total assist for lower body dressing due to extra time used while bathing and washing her hair; unable to complete tasks in 60 min session.  Therapy Documentation Precautions:  Precautions Precautions: Cervical;Fall Precaution Comments: Parasthesias Required Braces or Orthoses: Cervical Brace Cervical Brace: Hard collar;At all times Restrictions Weight Bearing Restrictions: No Other Position/Activity Restrictions: no lifting, pushing, or heaving lifting  Vital  Signs: Therapy Vitals Temp: 98 F (36.7 C) Temp src: Oral Pulse Rate: 76 Resp: 18 BP: 147/76 mmHg Patient Position, if appropriate: Lying Oxygen Therapy SpO2: 97 % O2 Device: None (Room air)  Pain: No/denies pain  See FIM for current functional status  Therapy/Group: Individual Therapy  Second session: Time: 1345-1415 Time Calculation (min):  30 min  Pain Assessment: No/denies pain  Skilled Therapeutic Interventions: Therapeutic exercises with focus on upper body strengthening, seated.  At w/c level using thera-band (orange, grade II) to improve shoulder girdle strength in prep for transfers and weight-shifting. Patient educated on 6 exercises: Chest pull, seated row, scapular chest pull, scapular pull downs, bicep curl, tricep pull downs. Patient performed 10 reps of each exercise with hand guidance for correct execution of exercise.  See FIM for current functional status  Therapy/Group: Individual Therapy  [place] 08/12/2013, 7:22 AM

## 2013-08-13 ENCOUNTER — Inpatient Hospital Stay (HOSPITAL_COMMUNITY): Payer: PRIVATE HEALTH INSURANCE

## 2013-08-13 ENCOUNTER — Inpatient Hospital Stay (HOSPITAL_COMMUNITY): Payer: PRIVATE HEALTH INSURANCE | Admitting: Physical Therapy

## 2013-08-13 NOTE — Progress Notes (Signed)
[place] PHYSICAL MEDICINE & REHABILITATION     PROGRESS NOTE    Subjective/Complaints:  Pain controlled.Amb 20feet x 3 assit of 2 persons Bowels moving ok RL:E joint sense is poor A 12 point review of systems has been performed and if not noted above is otherwise negative.   Objective: Vital Signs: Blood pressure 140/67, pulse 80, temperature 98.1 F (36.7 C), temperature source Oral, resp. rate 18, weight 109.77 kg (242 lb), SpO2 96.00%. No results found. No results found for this basename: WBC, HGB, HCT, PLT,  in the last 72 hours No results found for this basename: NA, K, CL, CO, GLUCOSE, BUN, CREATININE, CALCIUM,  in the last 72 hours CBG (last 3)  No results found for this basename: GLUCAP,  in the last 72 hours  Wt Readings from Last 3 Encounters:  08/11/13 109.77 kg (242 lb)  07/30/13 111.131 kg (245 lb)  07/30/13 111.131 kg (245 lb)    Physical Exam:  Constitutional:  79 year old obese female, no distress. alert  HENT: oral mucosa pink and moist  Head: Normocephalic.  Eyes: EOM are normal.  Neck: Normal range of motion. Neck supple. No thyromegaly present.  Cardiovascular: Normal rate and regular rhythm. No murmurs, rubs, or gallops  Respiratory: Effort normal and breath sounds normal. No respiratory distress.  GI: Soft. Bowel sounds are normal. She exhibits no distension.  Neurological:  patient is alert and a bit anxious. She is oriented x3 followed basic commands. RUE   3+ deltoid, bicep, triceps 3, WE 3+, HI almost 3, LUE 3 to 3+ deltoid, bicep, tricep, wrist and HI. LE: RHF, KE and ankle 2+ to 3+/5. LLE 2/5 proximal to distal. Decreased PP and LT/proprioception all 4's, lower more than upper, right more than left. DTR's 1+  Skin: Surgical site clean and dry without swelling, R paracervical drain site well healed  Psych: pleasant, cooperative, NON-anxious today  Assessment/Plan: 1. Functional deficits secondary to cervical epidural abscess with myelopathy  s/p C4-7 laminectomy and fusion C3-T1, which require 3+ hours per day of interdisciplinary therapy in a comprehensive inpatient rehab setting. Physiatrist is providing close team supervision and 24 hour management of active medical problems listed below. Physiatrist and rehab team continue to assess barriers to discharge/monitor patient progress toward functional and medical goals. FIM: FIM - Bathing Bathing Steps Patient Completed: Chest;Left Arm;Abdomen;Front perineal area;Right upper leg;Left upper leg;Right lower leg (including foot);Left lower leg (including foot) Bathing: 4: Min-Patient completes 8-9 57f 10 parts or 75+ percent  FIM - Upper Body Dressing/Undressing Upper body dressing/undressing steps patient completed: Thread/unthread right bra strap;Thread/unthread left bra strap;Thread/unthread right sleeve of pullover shirt/dresss;Thread/unthread left sleeve of pullover shirt/dress Upper body dressing/undressing: 3: Mod-Patient completed 50-74% of tasks FIM - Lower Body Dressing/Undressing Lower body dressing/undressing steps patient completed: Thread/unthread right pants leg;Thread/unthread left pants leg Lower body dressing/undressing: 1: Total-Patient completed less than 25% of tasks  FIM - Toileting Toileting steps completed by patient: Performs perineal hygiene Toileting Assistive Devices: Grab bar or rail for support Toileting: 2: Max-Patient completed 1 of 3 steps  FIM - Radio producer Devices: Recruitment consultant Transfers: 1-Mechanical lift  FIM - Control and instrumentation engineer Devices: Bed rails Bed/Chair Transfer: 4: Supine > Sit: Min A (steadying Pt. > 75%/lift 1 leg);1: Mechanical lift  FIM - Locomotion: Wheelchair Distance: 45 Locomotion: Wheelchair: 1: Travels less than 50 ft with minimal assistance (Pt.>75%) FIM - Locomotion: Ambulation Locomotion: Ambulation Assistive Devices: Administrator Ambulation/Gait  Assistance: 1: +2 Total  assist Locomotion: Ambulation: 1: Two helpers  Comprehension Comprehension Mode: Auditory Comprehension: 6-Follows complex conversation/direction: With extra time/assistive device  Expression Expression Mode: Verbal Expression: 6-Expresses complex ideas: With extra time/assistive device  Social Interaction Social Interaction: 6-Interacts appropriately with others with medication or extra time (anti-anxiety, antidepressant).  Problem Solving Problem Solving: 5-Solves basic problems: With no assist  Memory Memory: 5-Requires cues to use assistive device  Medical Problem List and Plan:  1. Cervical epidural abscess with tetraplegia. Status post anterior cervical decompression discectomy multilevel C4-C7 07/16/2013 with subsequent cord compression undergoing cervical 4-7 posterior cervical fusion, C4-C7 cervical laminectomy and C3-T1 levels 07/28/2013 , d/c suture from drain site 2. DVT Prophylaxis/Anticoagulation: SCDs.   lovenox 30 q12. Dopplers negative 3. Pain Management: Neurontin 100 mg 3 times a day, Hydrocodone and Robaxin as needed. Monitor with increased mobility  4.ID/microaerophilic streptococci. Intravenous ceftriaxone through 08/26/2013  5. Neuropsych: This patient is capable of making decisions on her own behalf.  -low dose valium prn for anxiety (limit as much as possible) --discussed with patient 6. Hypertension. Lisinopril 5 mg daily---increase to 10mg , Lasix 20 mg daily.  7. Asthma. Continue Dulera puffs twice daily as well as Flonase/CPAP  8. Neurogenic bladder/bowel.    -VRE in urine---will not treat given multi-resistance-  -continue voiding trial. maintain urecholine at 25mg  tid  -moving bowels 9. Obesity. History of gastric bypass  10. Insomnia. Trazodone 50 mg each bedtime  LOS (Days) 13 A FACE TO FACE EVALUATION WAS PERFORMED  Alysia Penna E 08/13/2013 10:25 AM

## 2013-08-13 NOTE — Progress Notes (Signed)
1 suture removed from beside honeycomb dressing to patient's upper back, no complications noted.

## 2013-08-13 NOTE — Progress Notes (Signed)
Physical Therapy Session Note  Patient Details  Name: [name] MRN: [id number] Date of Birth: 22-Nov-1934  Today's Date: 08/13/2013 Time First Session: 0930-1030 Time Calculation (min): 60 min  Short Term Goals: Week 2:  PT Short Term Goal 1 (Week 2): Pt will be able to complete transfers from various surfaces with min A to promote increased functional mobility PT Short Term Goal 2 (Week 2): Pt will be able to propel wheel chair 100 feet with supervision PT Short Term Goal 3 (Week 2): Pt will be able to ambulate 25-50 feet using the LRAD with min A PT Short Term Goal 4 (Week 2): Pt will be able to negotiate up/down 4 steps using bilateral railings with mod A.   First Session Skilled Therapeutic Interventions/Progress Updates:    Pt's son Yvone Neu present for first half treatment and observed pt's gait. Pt 0/10 pain   - Ambulation x 30', 36', 40' with bariatric RW and +2 for safety but min assist overall! Mirror provided for visual feedback due to impaired trunk and LE (Rt more impaired then LT) proprioception. Pt utilized line on floor to decrease adduction (primarily Rt LE) however able to gait well today intermittently without line for reference  - Single limb stance and knee stability activity + proprioceptive task - forwards cone taps; PT facilitation of Rt knee for modulation control to avoid hyperextension. Mod assist for balance with RW for bil UE support. Visual feedback provided as cervical collar limiting pt's ability to see cones.   Second Session Time:  2841-3244 Time Calculation (min): 45 min Skilled Therapeutic Interventions/Progress Updates:  Pain 0/10 Pt seated in w/c. Sit <> stands from w/c with supervision.  - Mini squats 2 x 20 reps for modulated knee control and strength/balance at various ranges of knee/hip flexion; cues for avoiding knee hyperextension - NMR: standing balance without UE support; LEs: normal base, narrow base, modified tandem 2 x each with rest as  needed - Stand pivot transfers w/c>bed with RW min assist cues for sequencing and RW position - Sit>supine mod assist, pt still unable to pull bil LEs into bed without assist.    Therapy Documentation Precautions:  Precautions Precautions: Cervical;Fall Precaution Comments: Parasthesias Required Braces or Orthoses: Cervical Brace Cervical Brace: Hard collar;At all times Restrictions Weight Bearing Restrictions: No Other Position/Activity Restrictions: no lifting, pushing, or heaving lifting    See FIM for current functional status  Therapy/Group: Individual Therapy both sessions  [name] 08/13/2013, 12:33 PM

## 2013-08-13 NOTE — Progress Notes (Signed)
Occupational Therapy Session Note  Patient Details  Name: [name] MRN: [id number] Date of Birth: 1934/07/07  Today's Date: 08/13/2013 Time: 0830-0930 Time Calculation (min): 60 min  Short Term Goals: Week 2:  OT Short Term Goal 1 (Week 2): Patient will complete lower body dressing with mod assist OT Short Term Goal 2 (Week 2): Patient will perform tranfer to tub bench with mod assist OT Short Term Goal 3 (Week 2): Patient will complete upper body bathing with setup OT Short Term Goal 4 (Week 2): Patient will complete upper body dressing with min assist OT Short Term Goal 5 (Week 2): Patient will maintain supported static standing balance during assisted ADL with min assist  Skilled Therapeutic Interventions/Progress Updates: ADL-retraining with focus on sit><stand, bed mobility, upper body dressing, toilet hygiene.   Patient received supine in bed awaiting therapy.   Patient performed lower body bathing using LH sponge and min assist to roll right to left and for assist with washing buttocks.   With [facility] elevated and legs partially abducted, patient washed peri-area with only setup assist while supine.   Patient rose from supine to sitting at edge of bed with min assist using bed rail and HOB elevated.  Patient maintained dynamic sitting balance while washing upper body with setup and min assist for thoroughness (at right UE).   Patient requested transfer assist for toileting and was assisted using Denna Haggard to [facility] with steadying while performing sit><stand using [place].   Patient used Charlaine Dalton again to perform hygiene with setup assist and completed lower body dressing with total assist in [place], although performing sit><stand 3 more times, unassisted to complete LB dressing task.   Patient was then transferred to w/c using [place] and placed at sink for grooming and oral hygiene.   Patient left at sink for thorough grooming with call light within reach at end of session.    Therapy  Documentation Precautions:  Precautions Precautions: Cervical;Fall Precaution Comments: Parasthesias Required Braces or Orthoses: Cervical Brace Cervical Brace: Hard collar;At all times Restrictions Weight Bearing Restrictions: No Other Position/Activity Restrictions: no lifting, pushing, or heaving lifting  Vital Signs: Therapy Vitals Pulse Rate: 80 BP: 140/67 mmHg  Pain: No/denies pain   See FIM for current functional status  Therapy/Group: Individual Therapy  Second session: Time: 1300-1330 Time Calculation (min): 30 min  Pain Assessment: No/denies pain  Skilled Therapeutic Interventions: ADL-retraining with focus on sit><stand, transfer using RW, weight-shifting, and safety awareness.   Patient received supine in bed, leaning toward right bed rail.   Patient able to complete bed mobility to rise from supine to sitting at edge of bed with min assist and verbal cues to reorient to midline during static sitting.   Patient able to rise from edge of bed to RW in prep for transfer to w/c but required tactile cues to improve postural control and repeated verbal cues to reposition right LE d/t impaired proprioception.   Patient consistently undershoots foot placement prior to initiating sitting and remains at risk for LOB due to narrow BOS.   Patient completed transfers with min guard with  Intermittent correction of right/left discrimination during verbal cues.   Patient left in w/c, safety belt affixed and with her son in the room at end of session to insure access to call light and phone.  See FIM for current functional status  Therapy/Group: Individual Therapy  [place] 08/13/2013, 10:01 AM

## 2013-08-14 ENCOUNTER — Inpatient Hospital Stay (HOSPITAL_COMMUNITY): Payer: PRIVATE HEALTH INSURANCE | Admitting: Physical Therapy

## 2013-08-14 MED ORDER — BETHANECHOL CHLORIDE 10 MG PO TABS
10.0000 mg | ORAL_TABLET | Freq: Three times a day (TID) | ORAL | Status: DC
  Administered 2013-08-14 – 2013-08-17 (×11): 10 mg via ORAL
  Filled 2013-08-14 (×16): qty 1

## 2013-08-14 NOTE — Progress Notes (Signed)
[place] PHYSICAL MEDICINE & REHABILITATION     PROGRESS NOTE    Subjective/Complaints:  Pain controlled, voice remains hoarse Bladderworking well no caths Bowels moving ok RLE joint sense is poor A 12 point review of systems has been performed and if not noted above is otherwise negative.   Objective: Vital Signs: Blood pressure 130/83, pulse 78, temperature 98.3 F (36.8 C), temperature source Oral, resp. rate 18, weight 109.77 kg (242 lb), SpO2 96.00%. No results found. No results found for this basename: WBC, HGB, HCT, PLT,  in the last 72 hours No results found for this basename: NA, K, CL, CO, GLUCOSE, BUN, CREATININE, CALCIUM,  in the last 72 hours CBG (last 3)  No results found for this basename: GLUCAP,  in the last 72 hours  Wt Readings from Last 3 Encounters:  08/11/13 109.77 kg (242 lb)  07/30/13 111.131 kg (245 lb)  07/30/13 111.131 kg (245 lb)    Physical Exam:  Constitutional:  79 year old obese female, no distress. alert  HENT: oral mucosa pink and moist  Head: Normocephalic.  Eyes: EOM are normal.  Neck: Normal range of motion. Neck supple. No thyromegaly present.  Cardiovascular: Normal rate and regular rhythm. No murmurs, rubs, or gallops  Respiratory: Effort normal and breath sounds normal. No respiratory distress.  GI: Soft. Bowel sounds are normal. She exhibits no distension.  Neurological:  patient is alert and a bit anxious. She is oriented x3 followed basic commands. RUE   3+ deltoid, bicep, triceps 3, WE 3+, HI almost 3, LUE 3 to 3+ deltoid, bicep, tricep, wrist and HI. LE: RHF, KE and ankle 4/5. LLE 4-/5 proximal to distal. Decreased proprioception all 4's, lower more than upper, right more than left. DTR's 1+  Skin: Surgical site clean and dry without swelling, R paracervical drain site well healed  Psych: pleasant, cooperative, NON-anxious today  Assessment/Plan: 1. Functional deficits secondary to cervical epidural abscess with myelopathy  s/p C4-7 laminectomy and fusion C3-T1, which require 3+ hours per day of interdisciplinary therapy in a comprehensive inpatient rehab setting. Physiatrist is providing close team supervision and 24 hour management of active medical problems listed below. Physiatrist and rehab team continue to assess barriers to discharge/monitor patient progress toward functional and medical goals. FIM: FIM - Bathing Bathing Steps Patient Completed: Chest;Left Arm;Abdomen;Front perineal area;Right upper leg;Left upper leg;Right lower leg (including foot);Left lower leg (including foot) Bathing: 4: Min-Patient completes 8-9 44f 10 parts or 75+ percent  FIM - Upper Body Dressing/Undressing Upper body dressing/undressing steps patient completed: Thread/unthread right bra strap;Thread/unthread left bra strap;Thread/unthread right sleeve of pullover shirt/dresss;Thread/unthread left sleeve of pullover shirt/dress Upper body dressing/undressing: 3: Mod-Patient completed 50-74% of tasks FIM - Lower Body Dressing/Undressing Lower body dressing/undressing steps patient completed: Thread/unthread right pants leg;Thread/unthread left pants leg Lower body dressing/undressing: 1: Total-Patient completed less than 25% of tasks  FIM - Toileting Toileting steps completed by patient: Performs perineal hygiene Toileting Assistive Devices: Grab bar or rail for support Toileting: 2: Max-Patient completed 1 of 3 steps  FIM - Radio producer Devices: Recruitment consultant Transfers: 1-Mechanical lift  FIM - Control and instrumentation engineer Devices: Bed rails Bed/Chair Transfer: 3: Sit > Supine: Mod A (lifting assist/Pt. 50-74%/lift 2 legs);4: Chair or W/C > Bed: Min A (steadying Pt. > 75%)  FIM - Locomotion: Wheelchair Distance: 45 Locomotion: Wheelchair: 1: Travels less than 50 ft with minimal assistance (Pt.>75%) FIM - Locomotion: Ambulation Locomotion: Ambulation Assistive  Devices: Administrator Ambulation/Gait  Assistance: 4: Min assist Locomotion: Ambulation: 1: Two helpers  Comprehension Comprehension Mode: Auditory Comprehension: 6-Follows complex conversation/direction: With extra time/assistive device  Expression Expression Mode: Verbal Expression: 6-Expresses complex ideas: With extra time/assistive device  Social Interaction Social Interaction: 6-Interacts appropriately with others with medication or extra time (anti-anxiety, antidepressant).  Problem Solving Problem Solving: 5-Solves basic problems: With no assist  Memory Memory: 5-Requires cues to use assistive device  Medical Problem List and Plan:  1. Cervical epidural abscess with tetraplegia. Status post anterior cervical decompression discectomy multilevel C4-C7 07/16/2013 with subsequent cord compression undergoing cervical 4-7 posterior cervical fusion, C4-C7 cervical laminectomy and C3-T1 levels 07/28/2013 , d/c suture from drain site 2. DVT Prophylaxis/Anticoagulation: SCDs.   lovenox 30 q12. Dopplers negative 3. Pain Management: Neurontin 100 mg 3 times a day, Hydrocodone and Robaxin as needed. Monitor with increased mobility  4.ID/microaerophilic streptococci. Intravenous ceftriaxone through 08/26/2013  5. Neuropsych: This patient is capable of making decisions on her own behalf.  -low dose valium prn for anxiety (limit as much as possible) --discussed with patient 6. Hypertension. Lisinopril 5 mg daily---increase to 10mg , Lasix 20 mg daily.  7. Asthma. Continue Dulera puffs twice daily as well as Flonase/CPAP  8. Neurogenic bladder/bowel.    -VRE in urine---will not treat given multi-resistance-  -continue voiding trial. wean urecholine  -moving bowels 9. Obesity. History of gastric bypass  10. Insomnia. Trazodone 50 mg each bedtime  LOS (Days) 14 A FACE TO FACE EVALUATION WAS PERFORMED  Tyjai Charbonnet E 08/14/2013 9:10 AM

## 2013-08-14 NOTE — Progress Notes (Signed)
Physical Therapy Session Note  Patient Details  Name: [name] MRN: [id number] Date of Birth: 14-Jan-1935  Today's Date: 08/14/2013 Time: 1655-3748 Time Calculation (min): 60 min  Short Term Goals: Week 1:  PT Short Term Goal 1 (Week 1): Pt will be able to complete transfers from bed<>chair with mod A.  PT Short Term Goal 1 - Progress (Week 1): Met PT Short Term Goal 2 (Week 1): Pt will be able to propel wheel chair 25' feet with mod A.  PT Short Term Goal 2 - Progress (Week 1): Progressing toward goal PT Short Term Goal 3 (Week 1): Pt will be able to ambulate 5 feet using the LRAD with +2 total assist PT Short Term Goal 3 - Progress (Week 1): Partly met  Therapy Documentation Precautions:  Precautions Precautions: Cervical;Fall Precaution Comments: Parasthesias Required Braces or Orthoses: Cervical Brace Cervical Brace: Hard collar;At all times Restrictions Weight Bearing Restrictions: No Other Position/Activity Restrictions: no lifting, pushing, or heaving lifting  Pain: denies pain  Therapeutic Activity:(15') Bed mobility supine into sitting EOB using handrail on Left with min-assist, Transfer training sit<->stand S/Mod-I into RW from elevated bed surface, sit<->stand with min-assist from standard seat height. Multiple transfers throughout tx session. Therapeutic Exercise:(15') Seated B LE there ex including LAQ, hip flexion and manually resisted hip abduction and adduction Gait Training:(15') using mirror for visual feedback on foot placement and positioning... Using RW 2 x 35' with S/min-assist with a couple of episodes of R knee buckling forward but patient able to recover standing position with min-assist. NeuroMuscular Re-education:(15') Static standing balance with mirror positioned in front for visual feedback on foot placement... Patient releasing UE support from RW and performing static standing balance with a variety of foot placements such as wide BOS, narrow BOS,  split stance with R Foot forward then with L foot forward, all the while, keeping knees from being in locked position. Patient maintaining static standing balance ~ 10 to 15 seconds at a time.    Therapy/Group: Individual Therapy  Clearence Ped 08/14/2013, 4:47 PM

## 2013-08-15 NOTE — Progress Notes (Signed)
[place] PHYSICAL MEDICINE & REHABILITATION     PROGRESS NOTE    Subjective/Complaints:  Pain controlled, voice remains hoarse Bladder working well no caths Bowels moving ok Hands and feet still have abnormal sensation A 12 point review of systems has been performed and if not noted above is otherwise negative.   Objective: Vital Signs: Blood pressure 156/79, pulse 77, temperature 99.2 F (37.3 C), temperature source Oral, resp. rate 19, weight 109.77 kg (242 lb), SpO2 93.00%. No results found. No results found for this basename: WBC, HGB, HCT, PLT,  in the last 72 hours No results found for this basename: NA, K, CL, CO, GLUCOSE, BUN, CREATININE, CALCIUM,  in the last 72 hours CBG (last 3)  No results found for this basename: GLUCAP,  in the last 72 hours  Wt Readings from Last 3 Encounters:  08/11/13 109.77 kg (242 lb)  07/30/13 111.131 kg (245 lb)  07/30/13 111.131 kg (245 lb)    Physical Exam:  Constitutional:  79 year old obese female, no distress. alert  HENT: oral mucosa pink and moist  Head: Normocephalic.  Eyes: EOM are normal.  Neck: Normal range of motion. Neck supple. No thyromegaly present.  Cardiovascular: Normal rate and regular rhythm. No murmurs, rubs, or gallops  Respiratory: Effort normal and breath sounds normal. No respiratory distress.  GI: Soft. Bowel sounds are normal. She exhibits no distension.  Neurological:  patient is alert and a bit anxious. She is oriented x3 followed basic commands. RUE   3+ deltoid, bicep, triceps 3, WE 3+, HI almost 3, LUE 3 to 3+ deltoid, bicep, tricep, wrist and HI. LE: RHF, KE and ankle 4/5. LLE 4-/5 proximal to distal. Decreased proprioception all 4's, lower more than upper, right more than left. DTR's 1+  Skin: ant neck CDI  Psych: pleasant, cooperative,   Assessment/Plan: 1. Functional deficits secondary to cervical epidural abscess with myelopathy s/p C4-7 laminectomy and fusion C3-T1, which require 3+ hours per  day of interdisciplinary therapy in a comprehensive inpatient rehab setting. Physiatrist is providing close team supervision and 24 hour management of active medical problems listed below. Physiatrist and rehab team continue to assess barriers to discharge/monitor patient progress toward functional and medical goals. FIM: FIM - Bathing Bathing Steps Patient Completed: Chest;Left Arm;Abdomen;Front perineal area;Right upper leg;Left upper leg;Right lower leg (including foot);Left lower leg (including foot) Bathing: 4: Min-Patient completes 8-9 6f 10 parts or 75+ percent  FIM - Upper Body Dressing/Undressing Upper body dressing/undressing steps patient completed: Thread/unthread right bra strap;Thread/unthread left bra strap;Thread/unthread right sleeve of pullover shirt/dresss;Thread/unthread left sleeve of pullover shirt/dress Upper body dressing/undressing: 3: Mod-Patient completed 50-74% of tasks FIM - Lower Body Dressing/Undressing Lower body dressing/undressing steps patient completed: Thread/unthread right pants leg;Thread/unthread left pants leg Lower body dressing/undressing: 1: Total-Patient completed less than 25% of tasks  FIM - Toileting Toileting steps completed by patient: Performs perineal hygiene Toileting Assistive Devices: Grab bar or rail for support Toileting: 2: Max-Patient completed 1 of 3 steps  FIM - Radio producer Devices: Recruitment consultant Transfers: 1-Mechanical lift  FIM - Control and instrumentation engineer Devices: Bed rails Bed/Chair Transfer: 3: Sit > Supine: Mod A (lifting assist/Pt. 50-74%/lift 2 legs);4: Chair or W/C > Bed: Min A (steadying Pt. > 75%)  FIM - Locomotion: Wheelchair Distance: 45 Locomotion: Wheelchair: 1: Travels less than 50 ft with minimal assistance (Pt.>75%) FIM - Locomotion: Ambulation Locomotion: Ambulation Assistive Devices: Administrator Ambulation/Gait Assistance: 4: Min  assist Locomotion: Ambulation: 1: Two  helpers  Comprehension Comprehension Mode: Auditory Comprehension: 6-Follows complex conversation/direction: With extra time/assistive device  Expression Expression Mode: Verbal Expression: 6-Expresses complex ideas: With extra time/assistive device  Social Interaction Social Interaction: 6-Interacts appropriately with others with medication or extra time (anti-anxiety, antidepressant).  Problem Solving Problem Solving: 5-Solves basic problems: With no assist  Memory Memory: 5-Recognizes or recalls 90% of the time/requires cueing < 10% of the time  Medical Problem List and Plan:  1. Cervical epidural abscess with tetraplegia. Status post anterior cervical decompression discectomy multilevel C4-C7 07/16/2013 with subsequent cord compression undergoing cervical 4-7 posterior cervical fusion, C4-C7 cervical laminectomy and C3-T1 levels 07/28/2013 , d/c suture from drain site 2. DVT Prophylaxis/Anticoagulation: SCDs.   lovenox 30 q12. Dopplers negative 3. Pain Management: Neurontin 100 mg 3 times a day, Hydrocodone and Robaxin as needed. Monitor with increased mobility  4.ID/microaerophilic streptococci. Intravenous ceftriaxone through 08/26/2013  5. Neuropsych: This patient is capable of making decisions on her own behalf.  -low dose valium prn for anxiety (limit as much as possible) --discussed with patient 6. Hypertension. Lisinopril 5 mg daily---increase to 10mg , Lasix 20 mg daily.  7. Asthma. Continue Dulera puffs twice daily as well as Flonase/CPAP  8. Neurogenic bladder/bowel.    -VRE in urine---will not treat given multi-resistance-  -continue voiding trial. wean urecholine down to 10mg   -moving bowels 9. Obesity. History of gastric bypass  10. Insomnia. Trazodone 50 mg each bedtime  LOS (Days) 15 A FACE TO FACE EVALUATION WAS PERFORMED  KIRSTEINS,ANDREW E 08/15/2013 8:14 AM

## 2013-08-16 ENCOUNTER — Inpatient Hospital Stay (HOSPITAL_COMMUNITY): Payer: PRIVATE HEALTH INSURANCE

## 2013-08-16 ENCOUNTER — Inpatient Hospital Stay (HOSPITAL_COMMUNITY): Payer: PRIVATE HEALTH INSURANCE | Admitting: Physical Therapy

## 2013-08-16 DIAGNOSIS — G8250 Quadriplegia, unspecified: Secondary | ICD-10-CM

## 2013-08-16 DIAGNOSIS — G061 Intraspinal abscess and granuloma: Secondary | ICD-10-CM

## 2013-08-16 NOTE — Progress Notes (Signed)
Physical Therapy Weekly Progress Note  Patient Details  Name: [name] MRN: [id number] Date of Birth: 09/27/1934  Beginning of progress report period: August 09, 2013 End of progress report period: August 16, 2013  Today's Date: 08/16/2013 Time: 6237-6283 Time Calculation (min): 25 min  Patient has met 3 of 4 short term goals.  Pt is currently able to ambulate x up to 40' with RW and +2 for safety, min/mod assist overall. Pt continues to need a w/c close behind during ambulation due to high falls risk and significantly impaired balance with fatigue.    Patient continues to demonstrate the following deficits: quadriparesis, decreased functional endurance, impaired balance reactions in standing, decreased truncal and LE proprioception, high falls risk and therefore will continue to benefit from skilled PT intervention to enhance overall performance with activity tolerance, balance, ability to compensate for deficits, functional use of  right upper extremity, right lower extremity, left upper extremity and left lower extremity and awareness.  Patient progressing toward long term goals..  Continue plan of care.  PT Short Term Goals Week 2:  PT Short Term Goal 1 (Week 2): Pt will be able to complete transfers from various surfaces with min A to promote increased functional mobility PT Short Term Goal 1 - Progress (Week 2): Met PT Short Term Goal 2 (Week 2): Pt will be able to propel wheel chair 50' feet with supervision PT Short Term Goal 2 - Progress (Week 2): Met PT Short Term Goal 3 (Week 2): Pt will be able to ambulate 25 feet using the LRAD with +2 assist PT Short Term Goal 3 - Progress (Week 2): Met PT Short Term Goal 4 (Week 2): Pt will be able to negotiate up/down 4 steps using bilateral railings with mod A.  PT Short Term Goal 4 - Progress (Week 2): Not met  Skilled Therapeutic Interventions/Progress Updates:    Pain: no c/o pain Session focused on static standing balance with  decreased UE support. Multiple bouts of standing balance with various bases of support, visual input alterations, and at various heights of center of gravity. RW in front for support as needed intermittently.   Second Session Time:  1100-1157 Time Calculation (min): 57 min Skilled Therapeutic Interventions/Progress Updates:  Pain: intermittent Lt glute pain  Not rated. Session focused on ambulation, transfers, and w/c mobility. Ambulation x 35', 2 x 40', 50' with RW and mod assist, with w/c to follow. Mirror feedback and line on floor intermittently utilized however pt no longer dependent on them for maintaining balance. Pt requires mod assist primarily for balance. Cues and assist for posture, step length and width, RW management. W/C switched from reclining chair to regular chair for trial.  W/C propulsion x 150' with multiple rest breaks- pt required 3 instances of needing cues to scoot posteriorly in chair. Pt able to manage brakes with min verbal cues. Pt excited to have more mobility in the room however will continue to need safety belt as she tends to slide hips forwards unawares.   Stand pivot w/c>bed with min assist, sit> supine with light mod assist - pt improving abilty to pull LEs into bed. Lt glute stretch for reported pain.   Therapy Documentation Precautions:  Precautions Precautions: Cervical;Fall Precaution Comments: Parasthesias Required Braces or Orthoses: Cervical Brace Cervical Brace: Hard collar;At all times Restrictions Weight Bearing Restrictions: No Other Position/Activity Restrictions: no lifting, pushing, or heaving lifting Pain: Pain Assessment Pain Assessment: No/denies pain  See FIM for current functional status  Therapy/Group:  Individual Therapy both sessions  [name] 08/16/2013, 12:06 PM

## 2013-08-16 NOTE — Progress Notes (Signed)
Occupational Therapy Session Note  Patient Details  Name: [name] MRN: [id number] Date of Birth: May 23, 1934  Today's Date: 08/16/2013 Time: 0730-0830 Time Calculation (min): 60 min  Short Term Goals: Week 2:  OT Short Term Goal 1 (Week 2): Patient will complete lower body dressing with mod assist OT Short Term Goal 2 (Week 2): Patient will perform tranfer to tub bench with mod assist OT Short Term Goal 3 (Week 2): Patient will complete upper body bathing with setup OT Short Term Goal 4 (Week 2): Patient will complete upper body dressing with min assist OT Short Term Goal 5 (Week 2): Patient will maintain supported static standing balance during assisted ADL with min assist  Skilled Therapeutic Interventions/Progress Updates: ADL-retraining with focus on improved transfers, adapted bathing/dressing skills, dynamic standing balance, toileting (hygiene), and safety awareness.   Patient received supine in bed awaiting therapist.   Patient able to rise to sitting at edge of bed using bed rail & HOB elevated.  Pt transferred to w/c with steadying assist and min verbal cues to review technique; pt completed transfer premature before making contact with LLE on w/c but succeeded in repositioning independently after sitting.   Pt reported need to toilet and completed stand-pivot transfer to [facility] over toilet using grab bar and steadying assist from OT.   Patient required assist with clothing management but completed toilet hygiene using partial stand/squat to reach peri-area, anterior approach.   As patient reported fatigue from transfers, OT provided mech lift assist off BSC and to/from tub bench this session to conserve energy needed for patient to perform bathing task.   Patient completed bath and was returned to edge of bed for dressing, with assist (see FIM).    Patient completed dressing and groomed at sink unassisted.   Patient left in room, setup with food tray and call light within reach.      Therapy Documentation Precautions:  Precautions Precautions: Cervical;Fall Precaution Comments: Parasthesias Required Braces or Orthoses: Cervical Brace Cervical Brace: Hard collar;At all times Restrictions Weight Bearing Restrictions: No Other Position/Activity Restrictions: no lifting, pushing, or heaving lifting  Pain: Pain Assessment Pain Score: Asleep  See FIM for current functional status  Therapy/Group: Individual Therapy  Second session: Time: 5053-9767 Time Calculation (min):  45 min  Pain Assessment: No/denies pain  Skilled Therapeutic Interventions: ADL-retraining with focus on toilet transfer, toilet hygiene using rubberized tongs, dynamic standing balance, tub bench and bed transfer (squat pivot and stand pivot).   Patient was challenged to demonstrate competence with use of tongs as patient reported use of similar device at home, PTA.  Patient completed stand pivot transfer from w/c to toilet with steadying assist, sat down on toilet,  stood up again, partially and demo'd preferred anterior approach method for hygiene using tongs with setup assist to provide toilet paper and device after she stood.   Patient then performed transfer to tub bench, squat pivot again with steadying assist and verbal cues for right foot positioning.   Patient returned to w/c and completed similar transfer from w/c to bed after assist with problem-solving on which transfer method to use and whether or not to use RW for standing or bed rail for squat.   Patient preferred squat pivot method and required only steadying assist but was unable to bring both legs into bed independently.  Patient left supine in bed with call light within reach, her sister in room.  See FIM for current functional status  Therapy/Group: Individual Therapy  Salome Spotted 08/16/2013,  8:46 AM

## 2013-08-16 NOTE — Progress Notes (Signed)
Occupational Therapy Weekly Progress Note  Patient Details  Name: [name] MRN: [id number] Date of Birth: January 15, 1935  Beginning of progress report period: August 10, 2013 End of progress report period: August 16, 2013  Today's Date: 08/17/2013  Patient has met 2 of 5 short term goals.  Patient is making excellent progress with 2 of 5 goals relating to bathing/dressing and has partly met 1 of 5 goals relating to B & D.     Patient continues to demonstrate the following deficits: Impaired transfer skills, impaired dynamic standing balance, general weakness and therefore will continue to benefit from skilled OT intervention to enhance overall performance with BADL.  Patient progressing toward long term goals..  Continue plan of care.  OT Short Term Goals Week 2:  OT Short Term Goal 1 (Week 2): Patient will complete lower body dressing with mod assist OT Short Term Goal 1 - Progress (Week 2): Progressing toward goal OT Short Term Goal 2 (Week 2): Patient will perform tranfer to tub bench with mod assist OT Short Term Goal 2 - Progress (Week 2): Met OT Short Term Goal 3 (Week 2): Patient will complete upper body bathing with setup OT Short Term Goal 3 - Progress (Week 2): Progressing toward goal OT Short Term Goal 4 (Week 2): Patient will complete upper body dressing with min assist OT Short Term Goal 4 - Progress (Week 2): Partly met OT Short Term Goal 5 (Week 2): Patient will maintain supported static standing balance during assisted ADL with min assist OT Short Term Goal 5 - Progress (Week 2): Met Week 3:  OT Short Term Goal 1 (Week 3): Patient will complete upper body bathing with setup OT Short Term Goal 2 (Week 3): Patient will complete upper body dressing with min assist OT Short Term Goal 3 (Week 3): Patient will complete lower body dressing with mod assist OT Short Term Goal 4 (Week 3): Patient will complete 2 of 3 tasks during toileting with steadying assist OT Short Term Goal  5 (Week 3): Patient will complete transfers on/off tub bench with supervision using min verbal cues for safety   Therapy Documentation Precautions:  Precautions Precautions: Cervical;Fall Precaution Comments: Parasthesias Required Braces or Orthoses: Cervical Brace Cervical Brace: Hard collar;At all times Restrictions Weight Bearing Restrictions: No Other Position/Activity Restrictions: no lifting, pushing, or heaving lifting  Pain: Pain Assessment Pain Assessment: 0-10 Pain Score: 5  Pain Type: Acute pain Pain Location: Shoulder Pain Orientation: Right Pain Descriptors / Indicators: Aching;Nagging Pain Frequency: Intermittent Pain Onset: Gradual Patients Stated Pain Goal: 2 Pain Intervention(s): Medication (See eMAR)  See FIM for current functional status  Judyth Demarais 08/17/2013, 7:31 AM

## 2013-08-16 NOTE — Progress Notes (Signed)
[facility] PHYSICAL MEDICINE & REHABILITATION     PROGRESS NOTE    Subjective/Complaints:  still concerned about hoarse voice. Otherwise very happy with how things are going A 12 point review of systems has been performed and if not noted above is otherwise negative.   Objective: Vital Signs: Blood pressure 149/77, pulse 82, temperature 98.4 F (36.9 C), temperature source Oral, resp. rate 18, weight 109.77 kg (242 lb), SpO2 96.00%. No results found. No results found for this basename: WBC, HGB, HCT, PLT,  in the last 72 hours No results found for this basename: NA, K, CL, CO, GLUCOSE, BUN, CREATININE, CALCIUM,  in the last 72 hours CBG (last 3)  No results found for this basename: GLUCAP,  in the last 72 hours  Wt Readings from Last 3 Encounters:  08/11/13 109.77 kg (242 lb)  07/30/13 111.131 kg (245 lb)  07/30/13 111.131 kg (245 lb)    Physical Exam:  Constitutional:  79 year old obese female, no distress. alert  HENT: oral mucosa pink and moist  Head: Normocephalic.  Eyes: EOM are normal.  Neck: Normal range of motion. Neck supple. No thyromegaly present.  Cardiovascular: Normal rate and regular rhythm. No murmurs, rubs, or gallops  Respiratory: Effort normal and breath sounds normal. No respiratory distress.  GI: Soft. Bowel sounds are normal. She exhibits no distension.  Neurological:  patient is alert and a bit anxious. She is oriented x3 followed basic commands. RUE   3+ deltoid, bicep, triceps 3, WE 3+, HI 3 to 3+, LUE  3+ to 4- deltoid, bicep, tricep, wrist and HI. LE: RHF, KE and ankle 4/5. LLE 4-/5 proximal to distal. Decreased proprioception all 4's, lower more than upper, right more than left. DTR's 1+  Skin: ant neck CDI  Psych: pleasant, cooperative,   Assessment/Plan: 1. Functional deficits secondary to cervical epidural abscess with myelopathy s/p C4-7 laminectomy and fusion C3-T1, which require 3+ hours per day of interdisciplinary therapy in a  comprehensive inpatient rehab setting. Physiatrist is providing close team supervision and 24 hour management of active medical problems listed below. Physiatrist and rehab team continue to assess barriers to discharge/monitor patient progress toward functional and medical goals. FIM: FIM - Bathing Bathing Steps Patient Completed: Chest;Left Arm;Abdomen;Right upper leg;Left upper leg Bathing: 3: Mod-Patient completes 5-7 43f 10 parts or 50-74%  FIM - Upper Body Dressing/Undressing Upper body dressing/undressing steps patient completed: Thread/unthread right sleeve of pullover shirt/dresss;Thread/unthread left sleeve of pullover shirt/dress;Put head through opening of pull over shirt/dress Upper body dressing/undressing: 3: Mod-Patient completed 50-74% of tasks FIM - Lower Body Dressing/Undressing Lower body dressing/undressing steps patient completed: Thread/unthread right pants leg;Thread/unthread left pants leg Lower body dressing/undressing: 1: Total-Patient completed less than 25% of tasks  FIM - Toileting Toileting steps completed by patient: Performs perineal hygiene Toileting Assistive Devices: Grab bar or rail for support Toileting: 2: Max-Patient completed 1 of 3 steps  FIM - Radio producer Devices: Product manager Transfers: 1-Mechanical lift  FIM - Control and instrumentation engineer Devices: Bed rails Bed/Chair Transfer: 3: Sit > Supine: Mod A (lifting assist/Pt. 50-74%/lift 2 legs);4: Chair or W/C > Bed: Min A (steadying Pt. > 75%)  FIM - Locomotion: Wheelchair Distance: 45 Locomotion: Wheelchair: 1: Travels less than 50 ft with minimal assistance (Pt.>75%) FIM - Locomotion: Ambulation Locomotion: Ambulation Assistive Devices: Administrator Ambulation/Gait Assistance: 4: Min assist Locomotion: Ambulation: 1: Two helpers  Comprehension Comprehension Mode: Auditory Comprehension: 7-Follows complex conversation/direction:  With no assist  Expression  Expression Mode: Verbal Expression: 6-Expresses complex ideas: With extra time/assistive device  Social Interaction Social Interaction: 6-Interacts appropriately with others with medication or extra time (anti-anxiety, antidepressant).  Problem Solving Problem Solving: 5-Solves complex 90% of the time/cues < 10% of the time  Memory Memory: 5-Recognizes or recalls 90% of the time/requires cueing < 10% of the time  Medical Problem List and Plan:  1. Cervical epidural abscess with tetraplegia. Status post anterior cervical decompression discectomy multilevel C4-C7 07/16/2013 with subsequent cord compression undergoing cervical 4-7 posterior cervical fusion, C4-C7 cervical laminectomy and C3-T1 levels 07/28/2013 , d/c suture from drain site 2. DVT Prophylaxis/Anticoagulation: SCDs.   lovenox 30 q12. Dopplers negative 3. Pain Management: Neurontin 100 mg 3 times a day, Hydrocodone and Robaxin as needed. Monitor with increased mobility  4.ID/microaerophilic streptococci. Intravenous ceftriaxone through 08/26/2013  5. Neuropsych: This patient is capable of making decisions on her own behalf.  -low dose valium prn for anxiety (limit as much as possible) --discussed with patient 6. Hypertension. Lisinopril 5 mg daily---increase to 10mg , Lasix 20 mg daily.  7. Asthma. Continue Dulera puffs twice daily as well as Flonase/CPAP  8. Neurogenic bladder/bowel.    -VRE in urine---will not treat given multi-resistance-  -continue voiding trial. wean urecholine down to 10mg   -moving bowels 9. Obesity. History of gastric bypass  10. Insomnia. Trazodone 50 mg each bedtime 11. Dsyphonia--trach trauma---ENT follow up as an outpt if persistent  LOS (Days) 16 A FACE TO FACE EVALUATION WAS PERFORMED  SWARTZ,ZACHARY T 08/16/2013 8:47 AM

## 2013-08-17 ENCOUNTER — Inpatient Hospital Stay (HOSPITAL_COMMUNITY): Payer: PRIVATE HEALTH INSURANCE

## 2013-08-17 ENCOUNTER — Inpatient Hospital Stay (HOSPITAL_COMMUNITY): Payer: PRIVATE HEALTH INSURANCE | Admitting: *Deleted

## 2013-08-17 ENCOUNTER — Inpatient Hospital Stay (HOSPITAL_COMMUNITY): Payer: PRIVATE HEALTH INSURANCE | Admitting: Physical Therapy

## 2013-08-17 DIAGNOSIS — G061 Intraspinal abscess and granuloma: Secondary | ICD-10-CM

## 2013-08-17 DIAGNOSIS — G8250 Quadriplegia, unspecified: Secondary | ICD-10-CM

## 2013-08-17 NOTE — Progress Notes (Signed)
Patient ID: [name], female   DOB: March 12, 1935, 79 y.o.   MRN: [id number] Social visit, mutual pt. [name] is in good spirits, pleased with her continued improvements with CIR's attention and assistance. She notes the plan is for SNF by April 18th if not earlier.  She is frustrated that she cannot go home, but fears she is not strong enough to stay alone yet.   Verdis Prime, RN, BSN

## 2013-08-17 NOTE — Progress Notes (Signed)
Physical Therapy Session Note  Patient Details  Name: [name] MRN: [id number] Date of Birth: 07-07-1934  Today's Date: 08/17/2013 Time: 4315-4008 Time Calculation (min): 32 min  Short Term Goals: Week 3:  PT Short Term Goal 1 (Week 3): Pt will ambulate x 40' with RW and min assist of one person with w/c to follow PT Short Term Goal 2 (Week 3): Pt will perform stand pivot transfer with RW and min-guard assist PT Short Term Goal 3 (Week 3): Pt will verbalize appropriate caregiver assist or be able to demonstrate appropriate pressure relief for improved skin integrity  Skilled Therapeutic Interventions/Progress Updates:    Patient received supine in bed, asleep, but agreeable to therapy. Session focused on functional mobility and functional strengthening. Wheelchair mobility x75' with B UE and supervision. Functional quad strengthening in [place], repeated semi-sit<>stand with decreased use of B UEs, emphasis on slow controlled stand>semi-sit for improved eccentric quad control. Patient returned to room and left sitting in wheelchair with seatbelt donned and all needs within reach.  Therapy Documentation Precautions:  Precautions Precautions: Cervical;Fall Precaution Comments: Parasthesias Required Braces or Orthoses: Cervical Brace Cervical Brace: Hard collar;At all times Restrictions Weight Bearing Restrictions: No Other Position/Activity Restrictions: no lifting, pushing, or heaving lifting Pain: Pain Assessment Pain Assessment: No/denies pain Pain Score: 0-No pain Locomotion : Ambulation Ambulation/Gait Assistance: Not tested (comment) Wheelchair Mobility Distance: 75   See FIM for current functional status  Therapy/Group: Individual Therapy  Lillia Abed. Tiauna Whisnant, PT, DPT 08/17/2013, 2:52 PM

## 2013-08-17 NOTE — Progress Notes (Signed)
[facility] PHYSICAL MEDICINE & REHABILITATION     PROGRESS NOTE    Subjective/Complaints:  no complaints. Feels well. Voice perhaps a little stronger this am.  A 12 point review of systems has been performed and if not noted above is otherwise negative.   Objective: Vital Signs: Blood pressure 148/83, pulse 75, temperature 98.2 F (36.8 C), temperature source Oral, resp. rate 18, weight 109.77 kg (242 lb), SpO2 93.00%. No results found. No results found for this basename: WBC, HGB, HCT, PLT,  in the last 72 hours No results found for this basename: NA, K, CL, CO, GLUCOSE, BUN, CREATININE, CALCIUM,  in the last 72 hours CBG (last 3)  No results found for this basename: GLUCAP,  in the last 72 hours  Wt Readings from Last 3 Encounters:  08/11/13 109.77 kg (242 lb)  07/30/13 111.131 kg (245 lb)  07/30/13 111.131 kg (245 lb)    Physical Exam:  Constitutional:  79 year old obese female, no distress. alert  HENT: oral mucosa pink and moist  Head: Normocephalic.  Eyes: EOM are normal.  Neck: Normal range of motion. Neck supple. No thyromegaly present.  Cardiovascular: Normal rate and regular rhythm. No murmurs, rubs, or gallops  Respiratory: Effort normal and breath sounds normal. No respiratory distress.  GI: Soft. Bowel sounds are normal. She exhibits no distension.  Neurological:  patient is alert and a bit anxious. She is oriented x3 followed basic commands. RUE   3+ deltoid, bicep, triceps 3, WE 3+, HI 3 to 3+, LUE  3+ to 4- deltoid, bicep, tricep, wrist and HI. LE: RHF, KE and ankle 4/5. LLE 4-/5 proximal to distal. Decreased proprioception all 4's, lower more than upper, right more than left. DTR's 1+  Skin: ant neck CDI  Psych: pleasant, cooperative,   Assessment/Plan: 1. Functional deficits secondary to cervical epidural abscess with myelopathy s/p C4-7 laminectomy and fusion C3-T1, which require 3+ hours per day of interdisciplinary therapy in a comprehensive inpatient  rehab setting. Physiatrist is providing close team supervision and 24 hour management of active medical problems listed below. Physiatrist and rehab team continue to assess barriers to discharge/monitor patient progress toward functional and medical goals. FIM: FIM - Bathing Bathing Steps Patient Completed: Chest;Right Arm;Left Arm;Abdomen;Right upper leg;Left upper leg Bathing: 3: Mod-Patient completes 5-7 92f 10 parts or 50-74%  FIM - Upper Body Dressing/Undressing Upper body dressing/undressing steps patient completed: Thread/unthread right bra strap;Thread/unthread left bra strap;Thread/unthread right sleeve of pullover shirt/dresss;Thread/unthread left sleeve of pullover shirt/dress Upper body dressing/undressing: 3: Mod-Patient completed 50-74% of tasks FIM - Lower Body Dressing/Undressing Lower body dressing/undressing steps patient completed: Thread/unthread right pants leg;Thread/unthread left pants leg Lower body dressing/undressing: 1: Total-Patient completed less than 25% of tasks  FIM - Toileting Toileting steps completed by patient: Performs perineal hygiene Toileting Assistive Devices: Grab bar or rail for support Toileting: 2: Max-Patient completed 1 of 3 steps  FIM - Radio producer Devices: Bedside commode;Grab bars Toilet Transfers: 1-Mechanical lift;4-To toilet/BSC: Min A (steadying Pt. > 75%) (Stedy used with transfer)  FIM - Control and instrumentation engineer Devices: Environmental consultant;Bed rails;Arm rests;HOB elevated Bed/Chair Transfer: 3: Sit > Supine: Mod A (lifting assist/Pt. 50-74%/lift 2 legs);4: Bed > Chair or W/C: Min A (steadying Pt. > 75%);4: Chair or W/C > Bed: Min A (steadying Pt. > 75%)  FIM - Locomotion: Wheelchair Distance: 45 Locomotion: Wheelchair: 4: Travels 150 ft or more: maneuvers on rugs and over door sillls with minimal assistance (Pt.>75%) FIM - Locomotion: Ambulation Locomotion:  Ambulation Assistive Devices:  Administrator Ambulation/Gait Assistance: 4: Min assist Locomotion: Ambulation: 2: Travels 50 - 149 ft with moderate assistance (Pt: 50 - 74%)  Comprehension Comprehension Mode: Auditory Comprehension: 7-Follows complex conversation/direction: With no assist  Expression Expression Mode: Verbal Expression: 6-Expresses complex ideas: With extra time/assistive device  Social Interaction Social Interaction: 6-Interacts appropriately with others with medication or extra time (anti-anxiety, antidepressant).  Problem Solving Problem Solving: 5-Solves complex 90% of the time/cues < 10% of the time  Memory Memory: 5-Recognizes or recalls 90% of the time/requires cueing < 10% of the time  Medical Problem List and Plan:  1. Cervical epidural abscess with tetraplegia. Status post anterior cervical decompression discectomy multilevel C4-C7 07/16/2013 with subsequent cord compression undergoing cervical 4-7 posterior cervical fusion, C4-C7 cervical laminectomy and C3-T1 levels 07/28/2013 , d/c suture from drain site 2. DVT Prophylaxis/Anticoagulation: SCDs.   lovenox 30 q12. Dopplers negative 3. Pain Management: Neurontin 100 mg 3 times a day, Hydrocodone and Robaxin as needed. Monitor with increased mobility  4.ID/microaerophilic streptococci. Intravenous ceftriaxone through 08/26/2013  5. Neuropsych: This patient is capable of making decisions on her own behalf.  -low dose valium prn for anxiety (limit as much as possible) --discussed with patient 6. Hypertension. Lisinopril 5 mg daily---increase to 10mg , Lasix 20 mg daily.  7. Asthma. Continue Dulera puffs twice daily as well as Flonase/CPAP  8. Neurogenic bladder/bowel.    -VRE in urine--  -continue voiding trial. Urecholine 10mg  tid  -moving bowels 9. Obesity. History of gastric bypass  10. Insomnia. Trazodone 50 mg each bedtime 11. Dsyphonia--trach trauma---ENT follow up as an outpt if persistent  -I believe this will resolve on its  own  LOS (Days) 17 A FACE TO FACE EVALUATION WAS PERFORMED  Hughie Melroy T 08/17/2013 8:23 AM

## 2013-08-17 NOTE — Progress Notes (Signed)
Physical Therapy Session Note  Patient Details  Name: [name] MRN: [id number] Date of Birth: 07-18-1934  Today's Date: 08/17/2013 First Session Time: 0930-1000 Time Calculation (min): 30 min  Short Term Goals: Week 3:  PT Short Term Goal 1 (Week 3): Pt will ambulate x 40' with RW and min assist of one person with w/c to follow PT Short Term Goal 2 (Week 3): Pt will perform stand pivot transfer with RW and min-guard assist PT Short Term Goal 3 (Week 3): Pt will verbalize appropriate caregiver assist or be able to demonstrate appropriate pressure relief for improved skin integrity  First Skilled Therapeutic Interventions/Progress Updates:    Pain: no c/o Session focused on functional endurance and single limb stability. W/c propulsion x 120' with bil UEs for functional strengthening; min assist for turns and avoiding obstacles. Standing single limb stance balance and LE proprioceptive challenge with bil UE support (RW) and mod assist while performing forwards cone taps. Pt placed on commode using STEDY due to time constraints.   Second Session Time:  1100-1200 Time Calculation (min): 60 min Skilled Therapeutic Interventions/Progress Updates:  Session focused on dynamic balance and endurance with ambulation, single limb stance stability, proprioception of trunk and LEs. Ambulation from room to gym with two seated rest breaks! 206' total (~3 x 68' bouts) with RW and mod assist for lateral instability, instability of bil LEs (Rt more impaired than Lt) during stance. Pt requires therapist cues to determine when gait getting too unsafe to continue (unable to tell). Single limb stance stability/proprioceptive activities kicking ball while bil UEs supported (mod assist and mod to max verbal cues for bil LE positioning). Step up/downs to 4" step for strengthening and bil LE proprioception, bil UEs support on RW, +2 for safety and PT for facilitation of modulated control of Rt knee.   Therapy  Documentation Precautions:  Precautions Precautions: Cervical;Fall Precaution Comments: Parasthesias Required Braces or Orthoses: Cervical Brace Cervical Brace: Hard collar;At all times Restrictions Weight Bearing Restrictions: No Other Position/Activity Restrictions: no lifting, pushing, or heaving lifting  See FIM for current functional status  Therapy/Group: Individual Therapy both sessions  [name] 08/17/2013, 10:10 AM

## 2013-08-17 NOTE — Progress Notes (Signed)
Occupational Therapy Session Note  Patient Details  Name: [name] MRN: [id number] Date of Birth: 1934/06/09  Today's Date: 08/17/2013 Time: 0730-0830 Time Calculation (min): 60 min  Short Term Goals: Week 3:  OT Short Term Goal 1 (Week 3): Patient will complete upper body bathing with setup OT Short Term Goal 2 (Week 3): Patient will complete upper body dressing with min assist OT Short Term Goal 3 (Week 3): Patient will complete lower body dressing with mod assist OT Short Term Goal 4 (Week 3): Patient will complete 2 of 3 tasks during toileting with steadying assist OT Short Term Goal 5 (Week 3): Patient will complete transfers on/off tub bench with supervision using min verbal cues for safety  Skilled Therapeutic Interventions/Progress Updates: ADL-retraining with focus on dynamic sitting balance, sit><stand, dynamic standing balance, performance of lateral leans at edge of bed, lower body dressing/bathing skills.  Patient re-assessed for performance of [facility] during self-feeding and is now able to open container and self-feed w/o use of AE.   Patient was challenged to duplicate method of bathing/dressing at edge of bed this session using portable wash basin.   With setup assist for efficiency, patient was able to undress and bathe sitting at edge of bed, using LH sponge under her arms but she continues to struggle with cervical collar during dressing tasks (mod A).  Patient attempted lateral leans to undress but became fatigued after 4 th set of leans while attempting to pull off pants.  With steadying assist, pt stood at [facility] and pulled down her pants but was too depleted to continue task of undress     Therapy Documentation Precautions:  Precautions Precautions: Cervical;Fall Precaution Comments: Parasthesias Required Braces or Orthoses: Cervical Brace Cervical Brace: Hard collar;At all times Restrictions Weight Bearing Restrictions: No Other Position/Activity Restrictions: no  lifting, pushing, or heaving lifting  Vital Signs: Therapy Vitals Temp: 98.2 F (36.8 C) Temp src: Oral Pulse Rate: 75 BP: 148/83 mmHg Patient Position, if appropriate: Lying Oxygen Therapy SpO2: 93 % O2 Device: None (Room air)  Pain: Pain Assessment Pain Assessment: 0-10 Pain Score: 5  Pain Type: Acute pain Pain Location: Shoulder Pain Orientation: Right Pain Descriptors / Indicators: Aching;Nagging Pain Frequency: Intermittent Pain Onset: Gradual Patients Stated Pain Goal: 2 Pain Intervention(s): Medication (See eMAR)  See FIM for current functional status  Therapy/Group: Individual Therapy  Second session: Time: 0086-7619 Time Calculation (min):  30 min  Pain Assessment: No/denies pain  Skilled Therapeutic Interventions: ADL-retraining with focus on toilet transfer, clothing management, toilet hygiene, w/c mobility, and BUE strengthening using thera-band.   Patient completed toileting, performing lateral lean to left to wipe, but required max assist for clothing management while standing up using grab bar to maintain static standing balance.   Patient returned to w/c and completed thera-band exercises with good form, 10 reps each of 5 BUE exercises with min verbal cues for technique.   See FIM for current functional status  Therapy/Group: Individual Therapy  Felicity Penix 08/17/2013, 8:42 AM

## 2013-08-18 ENCOUNTER — Inpatient Hospital Stay (HOSPITAL_COMMUNITY): Payer: PRIVATE HEALTH INSURANCE | Admitting: Physical Therapy

## 2013-08-18 ENCOUNTER — Inpatient Hospital Stay (HOSPITAL_COMMUNITY): Payer: PRIVATE HEALTH INSURANCE

## 2013-08-18 DIAGNOSIS — G8250 Quadriplegia, unspecified: Secondary | ICD-10-CM

## 2013-08-18 DIAGNOSIS — G061 Intraspinal abscess and granuloma: Secondary | ICD-10-CM

## 2013-08-18 MED ORDER — POLYETHYLENE GLYCOL 3350 17 G PO PACK
17.0000 g | PACK | Freq: Every day | ORAL | Status: DC
  Administered 2013-08-19 – 2013-08-22 (×4): 17 g via ORAL
  Filled 2013-08-18 (×9): qty 1

## 2013-08-18 NOTE — Progress Notes (Signed)
Occupational Therapy Session Note  Patient Details  Name: [name] MRN: [id number] Date of Birth: 07/05/1934  Today's Date: 08/18/2013 Time: 0830-0930 Time Calculation (min): 60 min  Short Term Goals: Week 3:  OT Short Term Goal 1 (Week 3): Patient will complete upper body bathing with setup OT Short Term Goal 2 (Week 3): Patient will complete upper body dressing with min assist OT Short Term Goal 3 (Week 3): Patient will complete lower body dressing with mod assist OT Short Term Goal 4 (Week 3): Patient will complete 2 of 3 tasks during toileting with steadying assist OT Short Term Goal 5 (Week 3): Patient will complete transfers on/off tub bench with supervision using min verbal cues for safety  Skilled Therapeutic Interventions/Progress Updates:  ADL-retraining with focus on transfers, dynamic sitting and standing balance, endurance, and improved safety awareness.  Patient received supine in bed awaiting planned ADL session.   Patient performed bed mobility independently using bed rail and HOB elevated after setup assist to remove pillows.  Patient completed transfer from edge of bed to w/c using RW for STP with min assist and verbal cues to attend to right LE.   Similar transfer was completed to bench w/o RW using grab bar for stability.   Patient completed thorough bathing this session with steadying assist @ RW while standing in walk-in shower to wash peri-area; pt was unable to reach buttocks safely.   Following shower patient transferred back to w/c again with only steadying assist and was assisted to edge of bed to complete dressing.    Patient performed upper body dressing with mod assist and then reported fatigue with need for max assist to dress LB.   OT provided max assist for LB dressing however patient completed static standing in RW for 45 seconds while OT pulled up her underwear and pants.    Patient returned to w/c, min A SPT, and was attended by RN dispensing meds.     Therapy Documentation Precautions:  Precautions Precautions: Cervical;Fall Precaution Comments: Parasthesias Required Braces or Orthoses: Cervical Brace Cervical Brace: Hard collar;At all times Restrictions Weight Bearing Restrictions: No Other Position/Activity Restrictions: no lifting, pushing, or heaving lifting  Vital Signs: Therapy Vitals Temp: 98.1 F (36.7 C) Temp src: Oral Pulse Rate: 72 Resp: 18 BP: 146/71 mmHg Oxygen Therapy SpO2: 97 % O2 Device: None (Room air)  Pain: Pain Assessment Pain Assessment: No/denies pain  See FIM for current functional status  Therapy/Group: Individual Therapy  Second session: Time: 9470-9628 Time Calculation (min):  45 min  Pain Assessment: No/denies pain  Skilled Therapeutic Interventions: Therex with focus on BU/LE strengthening using NuStep (15 min, level 2) and hand strengthening using thera-putty (tan).   Patient tolerated treatment well however fatigued after NuStep and required modification of planned treatment with substitution of seated hand exercises to enhance recovery in prep for next appt with physical therapist.     See FIM for current functional status  Therapy/Group: Individual Therapy  Salome Spotted 08/18/2013, 9:53 AM

## 2013-08-18 NOTE — Progress Notes (Signed)
Physical Therapy Session Note  Patient Details  Name: [name] MRN: [id number] Date of Birth: [birth year]  Today's Date: 08/18/2013 Time: 4656-8127 Time Calculation (min): 32 min  Short Term Goals: Week 2:  PT Short Term Goal 1 (Week 2): Pt will be able to complete transfers from various surfaces with min A to promote increased functional mobility PT Short Term Goal 1 - Progress (Week 2): Met PT Short Term Goal 2 (Week 2): Pt will be able to propel wheel chair 50' feet with supervision PT Short Term Goal 2 - Progress (Week 2): Met PT Short Term Goal 3 (Week 2): Pt will be able to ambulate 25 feet using the LRAD with +2 assist PT Short Term Goal 3 - Progress (Week 2): Met PT Short Term Goal 4 (Week 2): Pt will be able to negotiate up/down 4 steps using bilateral railings with mod A.  PT Short Term Goal 4 - Progress (Week 2): Not met Week 3:  PT Short Term Goal 1 (Week 3): Pt will ambulate x 40' with RW and min assist of one person with w/c to follow PT Short Term Goal 2 (Week 3): Pt will perform stand pivot transfer with RW and min-guard assist PT Short Term Goal 3 (Week 3): Pt will verbalize appropriate caregiver assist or be able to demonstrate appropriate pressure relief for improved skin integrity  Skilled Therapeutic Interventions/Progress Updates:   Pt resting in w/c.  Transferred pt to gym in w/c total A.  Pt transferred w/c > mat stand pivot with RW and min A.  Performed stair negotiation training up/down 3 short stairs (4") x 2 reps with bilat UE support on rails with mod A for RLE stability in stance and verbal cues for safe step to sequence; first attempt pt ascended and descended forwards with one episode of stepping down with LLE causing R knee buckle and required total A of therapist to prevent fall and return to standing; second attempt pt ascended forwards, descended backwards with mod A with assistance to safely place R foot on step secondary to impaired proprioception.   Performed gait x 25' with RW with mod A to assist with control of RW and stabilization of RLE in stance; pt fatigued quickly and requested to sit.  Returned to room in w/c.    Therapy Documentation Precautions:  Precautions Precautions: Cervical;Fall Precaution Comments: Parasthesias Required Braces or Orthoses: Cervical Brace Cervical Brace: Hard collar;At all times Restrictions Weight Bearing Restrictions: No Other Position/Activity Restrictions: no lifting, pushing, or heaving lifting Vital Signs: Oxygen Therapy SpO2: 96 % O2 Device: None (Room air) Pain: Pain Assessment Pain Assessment: No/denies pain Locomotion : Ambulation Ambulation/Gait Assistance: 3: Mod assist   See FIM for current functional status  Therapy/Group: Individual Therapy  Malachy Mood 08/18/2013, 11:17 AM

## 2013-08-18 NOTE — Progress Notes (Signed)
Subjective: Patient reports doing better  Objective: Vital signs in last 24 hours: Temp:  [98.1 F (36.7 C)] 98.1 F (36.7 C) (04/08 0600) Pulse Rate:  [72-82] 72 (04/08 0600) Resp:  [18] 18 (04/08 0600) BP: (143-146)/(71-97) 146/71 mmHg (04/08 0600) SpO2:  [93 %-97 %] 97 % (04/08 0600)  Intake/Output from previous day: 04/07 0701 - 04/08 0700 In: 600 [P.O.:600] Out: -  Intake/Output this shift:    Physical Exam: Strength has improved significantly in arms and legs, still weak in hand intrinsics.  Right leg still giving patient difficulty with proprioception.  Neck pain improved.  Lab Results: No results found for this basename: WBC, HGB, HCT, PLT,  in the last 72 hours BMET No results found for this basename: NA, K, CL, CO2, GLUCOSE, BUN, CREATININE, CALCIUM,  in the last 72 hours  Studies/Results: No results found.  Assessment/Plan: Making good progress in PT.  Continue Rehab.    LOS: 18 days    Erline Levine, MD 08/18/2013, 7:56 AM

## 2013-08-18 NOTE — Progress Notes (Signed)
Physical Therapy Session Note  Patient Details  Name: BASMA BUCHNER MRN: [id number] Date of Birth: [birth year]  Today's Date: 08/18/2013 Time: 6767-2094 Time Calculation (min): 60 min  Skilled Therapeutic Interventions/Progress Updates:  1:1. Pt received sitting in w/c, ready for therapy. Pt propelled w/c propulsion 100'x2 w/ B UE to target functional endurance, req Supervision-occasional min A during turns. Initiated amb, however, after 10' pt noted having episode of incontinence. Pt quickly transported to bathroom, min A for SPT w/c<>toilet w/ grab bar and max A for clothing management and pericare. Pt able to finish voiding once on commode. Pt total A for clothing change. Once back in gym emphasis on t/f sit<>stand w/ B UE on thighs to emphasize quad/glute control in B LE w/ height of mat gradually decreasing. Transition to static standing and B weight shifts w/out B UE support, req min-mod A. Pt sitting in w/c at end of session w/ all needs in reach, quick release belt in place.   Therapy Documentation Precautions:  Precautions Precautions: Cervical;Fall Precaution Comments: Parasthesias Required Braces or Orthoses: Cervical Brace Cervical Brace: Hard collar;At all times Restrictions Weight Bearing Restrictions: No Other Position/Activity Restrictions: no lifting, pushing, or heaving lifting  See FIM for current functional status  Therapy/Group: Individual Therapy  Gilmore Laroche 08/18/2013, 4:32 PM

## 2013-08-18 NOTE — Progress Notes (Signed)
[facility] PHYSICAL MEDICINE & REHABILITATION     PROGRESS NOTE    Subjective/Complaints:  continues to get stronger. Pain under control. Slept well. No anxiety.  A 12 point review of systems has been performed and if not noted above is otherwise negative.   Objective: Vital Signs: Blood pressure 146/71, pulse 72, temperature 98.1 F (36.7 C), temperature source Oral, resp. rate 18, weight 109.77 kg (242 lb), SpO2 97.00%. No results found. No results found for this basename: WBC, HGB, HCT, PLT,  in the last 72 hours No results found for this basename: NA, K, CL, CO, GLUCOSE, BUN, CREATININE, CALCIUM,  in the last 72 hours CBG (last 3)  No results found for this basename: GLUCAP,  in the last 72 hours  Wt Readings from Last 3 Encounters:  08/11/13 109.77 kg (242 lb)  07/30/13 111.131 kg (245 lb)  07/30/13 111.131 kg (245 lb)    Physical Exam:  Constitutional:  79 year old obese female, no distress. alert  HENT: oral mucosa pink and moist  Head: Normocephalic.  Eyes: EOM are normal.  Neck: Normal range of motion. Neck supple. No thyromegaly present.  Cardiovascular: Normal rate and regular rhythm. No murmurs, rubs, or gallops  Respiratory: Effort normal and breath sounds normal. No respiratory distress.  GI: Soft. Bowel sounds are normal. She exhibits no distension.  Neurological:  patient is alert and a bit anxious. She is oriented x3 followed basic commands. RUE   3+ deltoid, bicep, triceps 3, WE 3+, HI 3 to 3+, LUE  3+ to 4- deltoid, bicep, tricep, wrist and HI. LE: RHF, KE and ankle 4/5. LLE 4-/5 proximal to distal. Decreased proprioception all 4's, lower more than upper, right more than left. DTR's 1+  Skin: ant neck CDI  Psych: pleasant, cooperative,   Assessment/Plan: 1. Functional deficits secondary to cervical epidural abscess with myelopathy s/p C4-7 laminectomy and fusion C3-T1, which require 3+ hours per day of interdisciplinary therapy in a comprehensive  inpatient rehab setting. Physiatrist is providing close team supervision and 24 hour management of active medical problems listed below. Physiatrist and rehab team continue to assess barriers to discharge/monitor patient progress toward functional and medical goals. FIM: FIM - Bathing Bathing Steps Patient Completed: Chest;Right Arm;Left Arm (LH) Bathing: 3: Mod-Patient completes 5-7 59f 10 parts or 50-74%  FIM - Upper Body Dressing/Undressing Upper body dressing/undressing steps patient completed: Thread/unthread right sleeve of pullover shirt/dresss;Thread/unthread left sleeve of pullover shirt/dress Upper body dressing/undressing: 3: Mod-Patient completed 50-74% of tasks FIM - Lower Body Dressing/Undressing Lower body dressing/undressing steps patient completed: Don/Doff right sock;Don/Doff left sock Lower body dressing/undressing: 1: Total-Patient completed less than 25% of tasks  FIM - Toileting Toileting steps completed by patient: Performs perineal hygiene Toileting Assistive Devices: Grab bar or rail for support Toileting: 2: Max-Patient completed 1 of 3 steps  FIM - Radio producer Devices: Product manager Transfers: 1-Mechanical lift  FIM - Control and instrumentation engineer Devices: HOB elevated;Bed rails;Walker Bed/Chair Transfer: 5: Supine > Sit: Supervision (verbal cues/safety issues);4: Bed > Chair or W/C: Min A (steadying Pt. > 75%);4: Chair or W/C > Bed: Min A (steadying Pt. > 75%)  FIM - Locomotion: Wheelchair Distance: 75 Locomotion: Wheelchair: 2: Travels 50 - 149 ft with supervision, cueing or coaxing FIM - Locomotion: Ambulation Locomotion: Ambulation Assistive Devices: Administrator Ambulation/Gait Assistance: Not tested (comment) Locomotion: Ambulation: 0: Activity did not occur  Comprehension Comprehension Mode: Auditory Comprehension: 6-Follows complex conversation/direction: With extra time/assistive  device  Expression Expression Mode: Verbal Expression: 6-Expresses complex ideas: With extra time/assistive device  Social Interaction Social Interaction: 6-Interacts appropriately with others with medication or extra time (anti-anxiety, antidepressant).  Problem Solving Problem Solving: 5-Solves basic 90% of the time/requires cueing < 10% of the time  Memory Memory: 5-Recognizes or recalls 90% of the time/requires cueing < 10% of the time  Medical Problem List and Plan:  1. Cervical epidural abscess with tetraplegia. Status post anterior cervical decompression discectomy multilevel C4-C7 07/16/2013 with subsequent cord compression undergoing cervical 4-7 posterior cervical fusion, C4-C7 cervical laminectomy and C3-T1 levels 07/28/2013   -NS may dc collar soon 2. DVT Prophylaxis/Anticoagulation: SCDs.   lovenox 30 q12. Dopplers negative 3. Pain Management: Neurontin 100 mg 3 times a day, Hydrocodone and Robaxin as needed. Monitor with increased mobility  4.ID/microaerophilic streptococci. Intravenous ceftriaxone through 08/26/2013  5. Neuropsych: This patient is capable of making decisions on her own behalf.  -low dose valium prn for anxiety (limit as much as possible) --discussed with patient 6. Hypertension. Lisinopril 5 mg daily---increase to 10mg , Lasix 20 mg daily.  7. Asthma. Continue Dulera puffs twice daily as well as Flonase/CPAP  8. Neurogenic bladder/bowel.    -VRE in urine--  -  Urecholine 10mg  tid--dc today  -moving bowels 9. Obesity. History of gastric bypass  10. Insomnia. Trazodone 50 mg each bedtime 11. Dsyphonia--trach trauma---ENT follow up as an outpt if persistent  -I believe this will resolve on its own  LOS (Days) 18 A FACE TO FACE EVALUATION WAS PERFORMED  Meredith Staggers 08/18/2013 8:30 AM

## 2013-08-19 ENCOUNTER — Inpatient Hospital Stay (HOSPITAL_COMMUNITY): Payer: PRIVATE HEALTH INSURANCE

## 2013-08-19 ENCOUNTER — Inpatient Hospital Stay (HOSPITAL_COMMUNITY): Payer: PRIVATE HEALTH INSURANCE | Admitting: Physical Therapy

## 2013-08-19 NOTE — Progress Notes (Signed)
Physical Therapy Note  Patient Details  Name: [name] MRN: [id number] Date of Birth: [birth year] Today's Date: 08/19/2013 1450- 1520, 30 min individual therapy No pain reported  Tx focused on neuro re-ed during sit>< stand in [place], using bil UEs fading to no UEs with mod assist; wt shifting L><R with stance control without UEs; mini squats with UE support, reaching with L or R UE within BOS to facilitate trunk and hip ext.  Pt tolerated tx x 30 minutes with 3 brief rest breaks sitting in [place].  Frederic Jericho 08/19/2013, 3:42 PM

## 2013-08-19 NOTE — Progress Notes (Signed)
Occupational Therapy Session Note  Patient Details  Name: [name] MRN: [id number] Date of Birth: 1935/03/18  Today's Date: 08/19/2013 Time: 0930-1030 Time Calculation (min): 60 min  Short Term Goals: Week 3:  OT Short Term Goal 1 (Week 3): Patient will complete upper body bathing with setup OT Short Term Goal 2 (Week 3): Patient will complete upper body dressing with min assist OT Short Term Goal 3 (Week 3): Patient will complete lower body dressing with mod assist OT Short Term Goal 4 (Week 3): Patient will complete 2 of 3 tasks during toileting with steadying assist OT Short Term Goal 5 (Week 3): Patient will complete transfers on/off tub bench with supervision using min verbal cues for safety  Skilled Therapeutic Interventions/Progress Updates: ADL-retraining with focus on dynamic sitting and standing balance, sit><stand, functional mobility using RW, and general endurance.   Patient received supine in bed, receptive for planned ADL.   Patient completed bathing with min assist at shower level using tub bench and LH sponge.   Patient required supervision and verbal cues to maintain safety while ambulating to shower due to fast pace and impaired proprioception at right LE and anterior lean (forward head tilt).   Patient returned to edge of bed for lower body dressing but fatigued after assist with upper body dressing and required total assist to lace underwear and pants, don socks and shoes, and to pull up clothes after rising to supported at RW.   Patient transferred to w/c with min assist and completed grooming at sink with only setup.  Patient left at sink with call light within reach, safety belt secured.    Therapy Documentation Precautions:  Precautions Precautions: Cervical;Fall Precaution Comments: Parasthesias Required Braces or Orthoses: Cervical Brace Cervical Brace: Hard collar;At all times Restrictions Weight Bearing Restrictions: No Other Position/Activity  Restrictions: no lifting, pushing, or heaving lifting  Vital Signs: Oxygen Therapy O2 Device: None (Room air)  Pain: Pain Assessment Pain Score: 0-No pain  See FIM for current functional status  Therapy/Group: Individual Therapy  Second session: Time: 2951-8841 Time Calculation (min): 45 min  Pain Assessment: No/denies pain  Skilled Therapeutic Interventions: ADL-retraining with focus on toilet transfer, functional mobility, toilet hygiene and general strengthening using NuStep.  Patient received supine in bed reporting need to toilet (BM).  Patient completed bed mobility with steadying assist - supervision, ambulated to toilet and transferred with min guard, completed toileting after assist with clothing management, and performed hygiene with setup assist to provide toilet paper.   Patient attempted clothing management after BM while standing at RW but lost stability at left leg and required max assist to pull underwear and pants up over hips.   Patient was escorted to gym in w/c and performed 10 min of therex to improve BU/LE strength, level 2.   Patient was returned to room with her son present.  See FIM for current functional status  Therapy/Group: Individual Therapy  Salome Spotted 08/19/2013, 12:52 PM

## 2013-08-19 NOTE — Progress Notes (Signed)
Physical Therapy Session Note  Patient Details  Name: [name] MRN: [id number] Date of Birth: 1934-10-14  Today's Date: 08/19/2013 Time: 0730-0830 Time Calculation (min): 60 min  Short Term Goals: Week 3:  PT Short Term Goal 1 (Week 3): Pt will ambulate x 40' with RW and min assist of one person with w/c to follow PT Short Term Goal 2 (Week 3): Pt will perform stand pivot transfer with RW and min-guard assist PT Short Term Goal 3 (Week 3): Pt will verbalize appropriate caregiver assist or be able to demonstrate appropriate pressure relief for improved skin integrity  Skilled Therapeutic Interventions/Progress Updates:    Pain: no c/o pain Session focused on dynamic balance and gait training during ambulation, standing balance without UE support, and bil LE strengthening to promote improved functional mobility and return to prior level of function. Ambulation x 150', 67' with RW and +1 mod assist (therapist by self!!). Assist needed for correction of LOB and for cuing for step length, width, Rt knee control, and safety with RW. Pt able to determine need for rest with min cues. Improvements noted with Rt LE stability, minimal hyperextension or buckling noted today. Standing balance with narrow base + UE PNF patterns with weighted ball (no UE support) min assist for balance. Step up/down for quad strengthening 4" step with bil rails and mod assist, 2 x 5 reps each LE.   Therapy Documentation Precautions:  Precautions Precautions: Cervical;Fall Precaution Comments: Parasthesias Required Braces or Orthoses: Cervical Brace Cervical Brace: Hard collar;At all times Restrictions Weight Bearing Restrictions: No Other Position/Activity Restrictions: no lifting, pushing, or heaving lifting  See FIM for current functional status  Therapy/Group: Individual Therapy  Georgina Peer 08/19/2013, 9:54 AM

## 2013-08-19 NOTE — Progress Notes (Signed)
[facility] PHYSICAL MEDICINE & REHABILITATION     PROGRESS NOTE    Subjective/Complaints:  no new complaints. Asked when collar might come off A 12 point review of systems has been performed and if not noted above is otherwise negative.   Objective: Vital Signs: Blood pressure 148/64, pulse 89, temperature 98 F (36.7 C), temperature source Oral, resp. rate 18, weight 115.304 kg (254 lb 3.2 oz), SpO2 95.00%. No results found. No results found for this basename: WBC, HGB, HCT, PLT,  in the last 72 hours No results found for this basename: NA, K, CL, CO, GLUCOSE, BUN, CREATININE, CALCIUM,  in the last 72 hours CBG (last 3)  No results found for this basename: GLUCAP,  in the last 72 hours  Wt Readings from Last 3 Encounters:  08/18/13 115.304 kg (254 lb 3.2 oz)  07/30/13 111.131 kg (245 lb)  07/30/13 111.131 kg (245 lb)    Physical Exam:  Constitutional:  79 year old obese female, no distress. alert  HENT: oral mucosa pink and moist  Head: Normocephalic.  Eyes: EOM are normal.  Neck: Normal range of motion. Neck supple. No thyromegaly present.  Cardiovascular: Normal rate and regular rhythm. No murmurs, rubs, or gallops  Respiratory: Effort normal and breath sounds normal. No respiratory distress.  GI: Soft. Bowel sounds are normal. She exhibits no distension.  Neurological:  patient is alert and a bit anxious. She is oriented x3 followed basic commands. RUE   3+ deltoid, bicep, triceps 3, WE 3+, HI 3 to 3+, LUE  3+ to 4- deltoid, bicep, tricep, wrist and HI. LE: RHF, KE and ankle 4/5. LLE 4-/5 proximal to distal. Decreased proprioception all 4's, lower more than upper, right more than left. DTR's 1+  Skin: ant neck CDI  Psych: pleasant, cooperative,   Assessment/Plan: 1. Functional deficits secondary to cervical epidural abscess with myelopathy s/p C4-7 laminectomy and fusion C3-T1, which require 3+ hours per day of interdisciplinary therapy in a comprehensive inpatient  rehab setting. Physiatrist is providing close team supervision and 24 hour management of active medical problems listed below. Physiatrist and rehab team continue to assess barriers to discharge/monitor patient progress toward functional and medical goals.  Continue with c-collar per NS recs. (it was mentioned yesterday to pt yesterday by NS)---would need xrays first before removing.   FIM: FIM - Bathing Bathing Steps Patient Completed: Chest;Right Arm;Left Arm;Abdomen;Front perineal area;Right upper leg;Left upper leg Bathing: 4: Min-Patient completes 8-9 15f 10 parts or 75+ percent  FIM - Upper Body Dressing/Undressing Upper body dressing/undressing steps patient completed: Thread/unthread right sleeve of pullover shirt/dresss;Thread/unthread left sleeve of pullover shirt/dress Upper body dressing/undressing: 3: Mod-Patient completed 50-74% of tasks FIM - Lower Body Dressing/Undressing Lower body dressing/undressing steps patient completed: Don/Doff right sock;Don/Doff left sock Lower body dressing/undressing: 1: Total-Patient completed less than 25% of tasks  FIM - Toileting Toileting steps completed by patient: Performs perineal hygiene Toileting Assistive Devices: Grab bar or rail for support Toileting: 2: Max-Patient completed 1 of 3 steps  FIM - Radio producer Devices: Grab bars Toilet Transfers: 4-To toilet/BSC: Min A (steadying Pt. > 75%);4-From toilet/BSC: Min A (steadying Pt. > 75%)  FIM - Bed/Chair Transfer Bed/Chair Transfer Assistive Devices: Copy: 4: Bed > Chair or W/C: Min A (steadying Pt. > 75%);4: Chair or W/C > Bed: Min A (steadying Pt. > 75%)  FIM - Locomotion: Wheelchair Distance: 75 Locomotion: Wheelchair: 2: Travels 50 - 149 ft with minimal assistance (Pt.>75%) FIM - Locomotion: Ambulation Locomotion:  Ambulation Assistive Devices: Walker - Rolling Ambulation/Gait Assistance: 3: Mod assist Locomotion:  Ambulation: 1: Travels less than 50 ft with moderate assistance (Pt: 50 - 74%)  Comprehension Comprehension Mode: Auditory Comprehension: 6-Follows complex conversation/direction: With extra time/assistive device  Expression Expression Mode: Verbal Expression: 6-Expresses complex ideas: With extra time/assistive device  Social Interaction Social Interaction: 6-Interacts appropriately with others with medication or extra time (anti-anxiety, antidepressant).  Problem Solving Problem Solving: 5-Solves basic 90% of the time/requires cueing < 10% of the time  Memory Memory: 5-Recognizes or recalls 90% of the time/requires cueing < 10% of the time  Medical Problem List and Plan:  1. Cervical epidural abscess with tetraplegia. Status post anterior cervical decompression discectomy multilevel C4-C7 07/16/2013 with subsequent cord compression undergoing cervical 4-7 posterior cervical fusion, C4-C7 cervical laminectomy and C3-T1 levels 07/28/2013  2. DVT Prophylaxis/Anticoagulation: SCDs.   lovenox 30 q12. Dopplers negative 3. Pain Management: Neurontin 100 mg 3 times a day, Hydrocodone and Robaxin as needed. Monitor with increased mobility  4.ID/microaerophilic streptococci. Intravenous ceftriaxone through 08/26/2013  5. Neuropsych: This patient is capable of making decisions on her own behalf.  -low dose valium prn for anxiety (limit as much as possible) --discussed with patient 6. Hypertension. Lisinopril 5 mg daily---increase to 10mg , Lasix 20 mg daily.  7. Asthma. Continue Dulera puffs twice daily as well as Flonase/CPAP  8. Neurogenic bladder/bowel.    -VRE in urine--  -  Urecholine stopped  -moving bowels 9. Obesity. History of gastric bypass  10. Insomnia. Trazodone 50 mg each bedtime 11. Dsyphonia--trach trauma---ENT follow up as an outpt if persistent  -I believe this will resolve on its own  LOS (Days) [place] EVALUATION WAS PERFORMED  Meredith Staggers 08/19/2013  8:32 AM

## 2013-08-20 ENCOUNTER — Inpatient Hospital Stay (HOSPITAL_COMMUNITY): Payer: PRIVATE HEALTH INSURANCE | Admitting: Physical Therapy

## 2013-08-20 ENCOUNTER — Inpatient Hospital Stay (HOSPITAL_COMMUNITY): Payer: PRIVATE HEALTH INSURANCE

## 2013-08-20 DIAGNOSIS — G061 Intraspinal abscess and granuloma: Secondary | ICD-10-CM

## 2013-08-20 DIAGNOSIS — G8250 Quadriplegia, unspecified: Secondary | ICD-10-CM

## 2013-08-20 NOTE — Progress Notes (Addendum)
Physical Therapy Session Note  Patient Details  Name: [name] MRN: [id number] Date of Birth: 1934-09-09  Today's Date: 08/20/2013 Time: 0733-0828 Time Calculation (min): 55 min  Short Term Goals: Week 3:  PT Short Term Goal 1 (Week 3): Pt will ambulate x 40' with RW and min assist of one person with w/c to follow PT Short Term Goal 2 (Week 3): Pt will perform stand pivot transfer with RW and min-guard assist PT Short Term Goal 3 (Week 3): Pt will verbalize appropriate caregiver assist or be able to demonstrate appropriate pressure relief for improved skin integrity  Skilled Therapeutic Interventions/Progress Updates:    Start of session focused on home enivornment ambulation, standing balance in front of sink and in bathroom performed with min assist and RW, mod verbal cues for safety throughout.   Ambulation to the gym and back!!! 2 x 178' with RW and min/mod assist cues for step width and length. Assist for intermittent loss of balance. Stairs ascend/descend forwards with bil railing 2 x 3 steps with mod assist and one max assist due to Rt LE buckle after pt accidentally tried to step to second step instead of first. Basketball shoot working on standing balance with out UE support - up to mod assist for balance.   Goals updated due to progress.  Therapy Documentation Precautions:  Precautions Precautions: Cervical;Fall Precaution Comments: Parasthesias Required Braces or Orthoses: Cervical Brace Cervical Brace: Hard collar;At all times Restrictions Weight Bearing Restrictions: No Other Position/Activity Restrictions: no lifting, pushing, or heaving lifting Pain: No c/o  See FIM for current functional status  Therapy/Group: Individual Therapy  Georgina Peer 08/20/2013, 8:31 AM

## 2013-08-20 NOTE — Progress Notes (Signed)
Occupational Therapy Session Note  Patient Details  Name: [name] MRN: [id number] Date of Birth: 20-Sep-1934  Today's Date: 08/20/2013 Time: 1000-1100 Time Calculation (min): 60 min  Short Term Goals: Week 3:  OT Short Term Goal 1 (Week 3): Patient will complete upper body bathing with setup OT Short Term Goal 2 (Week 3): Patient will complete upper body dressing with min assist OT Short Term Goal 3 (Week 3): Patient will complete lower body dressing with mod assist OT Short Term Goal 4 (Week 3): Patient will complete 2 of 3 tasks during toileting with steadying assist OT Short Term Goal 5 (Week 3): Patient will complete transfers on/off tub bench with supervision using min verbal cues for safety  Skilled Therapeutic Interventions/Progress Updates: ADL-retraining with focus on improved dynamic standing balance, toileting, and lower body dressing skills.   Patient ambulated from edge of bed to tub bench with steadying assist and completed seated shower with setup and steadying assist while standing to wash peri-area and buttocks using LH sponge.   Patient was expediently transferred to toilet after shower using Denna Haggard due to urgency.   Patient completed toileting (BM, urine) and performed partial squat/lateral lean to complete hygiene.   Patient was then transferred to recliner place near bedside to attempt lower body dressing from lower surface as she reports her bed is lower than facility equipment.   Patient laced underwear and pants with extra time and pulled them up (95%) with BSC directly in front of her for added stability.   Patient required assist to don socks and shoes and was setup at sink to complete grooming and oral care with call light within reach.    Therapy Documentation Precautions:  Precautions Precautions: Cervical;Fall Precaution Comments: Parasthesias Required Braces or Orthoses: Cervical Brace Cervical Brace: Hard collar;At all times Restrictions Weight  Bearing Restrictions: No Other Position/Activity Restrictions: no lifting, pushing, or heaving lifting  Vital Signs: Therapy Vitals Temp: 98.1 F (36.7 C) Temp src: Oral Pulse Rate: 77 Resp: 20 BP: 132/85 mmHg Patient Position, if appropriate: Sitting Oxygen Therapy SpO2: 93 % O2 Device: None (Room air)  Pain: No/denies pain   See FIM for current functional status  Therapy/Group: Individual Therapy  Second session: Time: 1400-1430 Time Calculation (min): 30 min  Pain Assessment: No/denies pain  Skilled Therapeutic Interventions: Therapeutic exercises with focus isolated thumb, finger and general hand strengthening using thera-putty.  Patient performed 6 isolated exercises using putty with re-ed required on differentiating gross finger flexion and extension exercises from isolated pinch exercises (tip, tripod,   See FIM for current functional status  Therapy/Group: Individual Therapy  Salome Spotted 08/20/2013, 3:24 PM

## 2013-08-20 NOTE — Progress Notes (Signed)
[facility]     PROGRESS NOTE    Subjective/Complaints:  overall pleased with progress. No new problems overnight A 12 point review of systems has been performed and if not noted above is otherwise negative.   Objective: Vital Signs: Blood pressure 124/66, pulse 82, temperature 97.8 F (36.6 C), temperature source Oral, resp. rate 18, weight 115.304 kg (254 lb 3.2 oz), SpO2 93.00%. No results found. No results found for this basename: WBC, HGB, HCT, PLT,  in the last 72 hours No results found for this basename: NA, K, CL, CO, GLUCOSE, BUN, CREATININE, CALCIUM,  in the last 72 hours CBG (last 3)  No results found for this basename: GLUCAP,  in the last 72 hours  Wt Readings from Last 3 Encounters:  08/18/13 115.304 kg (254 lb 3.2 oz)  07/30/13 111.131 kg (245 lb)  07/30/13 111.131 kg (245 lb)    Physical Exam:  Constitutional:  79 year old obese female, no distress. alert  HENT: oral mucosa pink and moist  Head: Normocephalic.  Eyes: EOM are normal.  Neck: Normal range of motion. Neck supple. No thyromegaly present.  Cardiovascular: Normal rate and regular rhythm. No murmurs, rubs, or gallops  Respiratory: Effort normal and breath sounds normal. No respiratory distress.  GI: Soft. Bowel sounds are normal. She exhibits no distension.  Neurological:  patient is alert and a bit anxious. She is oriented x3 followed basic commands. RUE   3+ deltoid, bicep, triceps 3, WE 3+, HI 3 to 3+, LUE  3+ to 4- deltoid, bicep, tricep, wrist and HI. LE: RHF, KE and ankle 4/5. LLE 4-/5 proximal to distal. Decreased proprioception all 4's, lower more than upper, right more than left. DTR's 1+  Skin: ant neck CDI  Psych: pleasant, cooperative,   Assessment/Plan: 1. Functional deficits secondary to cervical epidural abscess with myelopathy s/p C4-7 laminectomy and fusion C3-T1, which require 3+ hours per day of interdisciplinary therapy in a comprehensive  inpatient rehab setting. Physiatrist is providing close team supervision and 24 hour management of active medical problems listed below. Physiatrist and rehab team continue to assess barriers to discharge/monitor patient progress toward functional and medical goals.  Continue with c-collar per NS recs.  FIM: FIM - Bathing Bathing Steps Patient Completed: Chest;Right Arm;Left Arm;Abdomen;Front perineal area;Right upper leg;Left upper leg Bathing: 3: Mod-Patient completes 5-7 65f 10 parts or 50-74%  FIM - Upper Body Dressing/Undressing Upper body dressing/undressing steps patient completed: Thread/unthread right bra strap;Thread/unthread left bra strap;Thread/unthread right sleeve of pullover shirt/dresss;Thread/unthread left sleeve of pullover shirt/dress Upper body dressing/undressing: 3: Mod-Patient completed 50-74% of tasks FIM - Lower Body Dressing/Undressing Lower body dressing/undressing steps patient completed: Don/Doff right sock;Don/Doff left sock Lower body dressing/undressing: 1: Total-Patient completed less than 25% of tasks  FIM - Toileting Toileting steps completed by patient: Performs perineal hygiene Toileting Assistive Devices: Grab bar or rail for support Toileting: 2: Max-Patient completed 1 of 3 steps  FIM - Radio producer Devices: Grab bars Toilet Transfers: 4-To toilet/BSC: Min A (steadying Pt. > 75%);4-From toilet/BSC: Min A (steadying Pt. > 75%)  FIM - Control and instrumentation engineer Devices: Walker;Arm rests;HOB elevated;Bed rails Bed/Chair Transfer: 5: Supine > Sit: Supervision (verbal cues/safety issues);4: Bed > Chair or W/C: Min A (steadying Pt. > 75%);4: Chair or W/C > Bed: Min A (steadying Pt. > 75%)  FIM - Locomotion: Wheelchair Distance: 75 Locomotion: Wheelchair: 2: Travels 50 - 149 ft with minimal assistance (Pt.>75%) FIM - Locomotion: Ambulation Locomotion: Ambulation  Assistive Devices: Astronomer Ambulation/Gait Assistance: 3: Mod assist Locomotion: Ambulation: 3: Travels 150 ft or more with moderate assistance (Pt: 50 - 74%)  Comprehension Comprehension Mode: Auditory Comprehension: 6-Follows complex conversation/direction: With extra time/assistive device  Expression Expression Mode: Verbal Expression: 6-Expresses complex ideas: With extra time/assistive device  Social Interaction Social Interaction: 6-Interacts appropriately with others with medication or extra time (anti-anxiety, antidepressant).  Problem Solving Problem Solving: 5-Solves basic 90% of the time/requires cueing < 10% of the time  Memory Memory: 5-Recognizes or recalls 90% of the time/requires cueing < 10% of the time  Medical Problem List and Plan:  1. Cervical epidural abscess with tetraplegia. Status post anterior cervical decompression discectomy multilevel C4-C7 07/16/2013 with subsequent cord compression undergoing cervical 4-7 posterior cervical fusion, C4-C7 cervical laminectomy and C3-T1 levels 07/28/2013  2. DVT Prophylaxis/Anticoagulation: SCDs.   lovenox 30 q12. Dopplers negative 3. Pain Management: Neurontin 100 mg 3 times a day, Hydrocodone and Robaxin as needed. Monitor with increased mobility  4.ID/microaerophilic streptococci. Intravenous ceftriaxone through 08/26/2013  5. Neuropsych: This patient is capable of making decisions on her own behalf.  -low dose valium prn for anxiety (limit as much as possible) --discussed with patient 6. Hypertension. Lisinopril 5 mg daily---increased to 10mg , continue Lasix 20 mg daily.  7. Asthma. Continue Dulera puffs twice daily as well as Flonase/CPAP  8. Neurogenic bladder/bowel.    -VRE in urine--  -  Urecholine stopped without issue  -moving bowels 9. Obesity. History of gastric bypass  10. Insomnia. Trazodone 50 mg each bedtime 11. Dsyphonia--trach trauma---ENT follow up as an outpt if needed  -observation for now  LOS (Days) [place] 08/20/2013 8:35 AM

## 2013-08-20 NOTE — Progress Notes (Signed)
Physical Therapy Session Note  Patient Details  Name: [name] MRN: [id number] Date of Birth: May 15, 1934  Today's Date: 08/20/2013 Time: 5366-4403 Time Calculation (min): 45 min  Short Term Goals: Week 3:  PT Short Term Goal 1 (Week 3): Pt will ambulate x 40' with RW and min assist of one person with w/c to follow PT Short Term Goal 2 (Week 3): Pt will perform stand pivot transfer with RW and min-guard assist PT Short Term Goal 3 (Week 3): Pt will verbalize appropriate caregiver assist or be able to demonstrate appropriate pressure relief for improved skin integrity  Skilled Therapeutic Interventions/Progress Updates:    Pt received seated in w/c; agreeable to therapy. Session focused on increasing LE strength/muscular endurance and improving gait stability. Pt performed gait x178', x100' in controlled environment with rolling walker and min A, min verbal cueing to widen BOS with effective within-session carryover. Stand pivot transfer from w/c<>mat table with rolling walker and min guard-min A, min verbal cueing for hand placement. See below for detailed description of therapeutic exercises. Session ended in pt room, where pt was left seated in w/c with all needs within reach.    Therapy Documentation Precautions:  Precautions Precautions: Cervical;Fall Precaution Comments: Parasthesias Required Braces or Orthoses: Cervical Brace Cervical Brace: Hard collar;At all times Restrictions Weight Bearing Restrictions: No Other Position/Activity Restrictions: no lifting, pushing, or heaving lifting Vital Signs: Therapy Vitals Temp: 98.1 F (36.7 C) Temp src: Oral Pulse Rate: 77 Resp: 20 BP: 132/85 mmHg Patient Position, if appropriate: Sitting Oxygen Therapy SpO2: 93 % O2 Device: None (Room air) Pain: Pain Assessment Pain Assessment: No/denies pain Pain Score: 0-No pain Locomotion : Ambulation Ambulation/Gait Assistance: 4: Min assist  Exercises: Pt performed bilat  side stepping x25' per side with bilat UE support at hallway hand rail, min guard. Verbal cueing provided to promote internal rotation of respective hip to isolate gluteus medius. Seated rest break required between sets.  See FIM for current functional status  Therapy/Group: Individual Therapy  Malva Cogan [place] 08/20/2013, 4:02 PM

## 2013-08-21 ENCOUNTER — Inpatient Hospital Stay (HOSPITAL_COMMUNITY): Payer: PRIVATE HEALTH INSURANCE | Admitting: Physical Therapy

## 2013-08-21 MED ORDER — SODIUM CHLORIDE 0.9 % IJ SOLN
10.0000 mL | INTRAMUSCULAR | Status: DC | PRN
  Administered 2013-08-21 – 2013-08-23 (×2): 10 mL via INTRAVENOUS

## 2013-08-21 MED ORDER — SODIUM CHLORIDE 0.9 % IJ SOLN
10.0000 mL | Freq: Two times a day (BID) | INTRAMUSCULAR | Status: DC
Start: 2013-08-21 — End: 2013-08-26
  Administered 2013-08-21 – 2013-08-25 (×5): 10 mL via INTRAVENOUS

## 2013-08-21 NOTE — Progress Notes (Signed)
[name] is a 79 y.o. female 16-Nov-1934 [id number]  Subjective: No new problems: not constipated. Slept well. Feeling OK.  Objective: Vital signs in last 24 hours: Temp:  [98 F (36.7 C)-98.1 F (36.7 C)] 98 F (36.7 C) (04/11 0600) Pulse Rate:  [77-79] 79 (04/11 0600) Resp:  [18-20] 18 (04/11 0600) BP: (132-165)/(80-85) 165/80 mmHg (04/11 0600) SpO2:  [93 %-97 %] 97 % (04/11 0600) Weight change:  Last BM Date: 08/20/13  Intake/Output from previous day: 04/10 0701 - 04/11 0700 In: 960 [P.O.:960] Out: -  Last cbgs: CBG (last 3)  No results found for this basename: GLUCAP,  in the last 72 hours   Physical Exam General: No apparent distress   HEENT: not dry. Ritta Slot is present Lungs: Normal effort. Lungs clear to auscultation, no crackles or wheezes. Cardiovascular: Regular rate and rhythm, no edema Abdomen: S/NT/ND; BS(+) Musculoskeletal:  unchanged Neurological: No new neurological deficits Wounds: N/A    Skin: clear  Aging changes Mental state: Alert, oriented, cooperative    Lab Results: BMET    Component Value Date/Time   NA 139 08/02/2013 0548   K 4.2 08/02/2013 0548   CL 98 08/02/2013 0548   CO2 31 08/02/2013 0548   GLUCOSE 113* 08/02/2013 0548   BUN 10 08/02/2013 0548   CREATININE 0.44* 08/02/2013 0548   CALCIUM 8.9 08/02/2013 0548   GFRNONAA >90 08/02/2013 0548   GFRAA >90 08/02/2013 0548   CBC    Component Value Date/Time   WBC 8.0 08/02/2013 0548   RBC 3.32* 08/02/2013 0548   HGB 10.2* 08/02/2013 0548   HCT 31.6* 08/02/2013 0548   PLT 281 08/02/2013 0548   MCV 95.2 08/02/2013 0548   MCH 30.7 08/02/2013 0548   MCHC 32.3 08/02/2013 0548   RDW 13.9 08/02/2013 0548   LYMPHSABS 2.1 07/26/2013 1415   MONOABS 0.8 07/26/2013 1415   EOSABS 0.2 07/26/2013 1415   BASOSABS 0.0 07/26/2013 1415    Studies/Results: No results found.  Medications: I have reviewed the patient's current medications.  Assessment/Plan:   1. Cervical epidural abscess with  tetraplegia. Status post anterior cervical decompression discectomy multilevel C4-C7 07/16/2013 with subsequent cord compression undergoing cervical 4-7 posterior cervical fusion, C4-C7 cervical laminectomy and C3-T1 levels 07/28/2013  2. DVT Prophylaxis/Anticoagulation: SCDs. lovenox 30 q12. Dopplers negative  3. Pain Management: Neurontin 100 mg 3 times a day, Hydrocodone and Robaxin as needed. Monitor with increased mobility  4.ID/microaerophilic streptococci. Intravenous ceftriaxone through 08/26/2013  5. Neuropsych: This patient is capable of making decisions on her own behalf.  -low dose valium prn for anxiety (limit as much as possible) --discussed with patient  6. Hypertension. Lisinopril 5 mg daily, Lasix 20 mg daily.  7. Asthma. Continue Dulera puffs twice daily as well as Flonase/CPAP  8. Neurogenic bladder/bowel. Foley replaced for retention  -VRE in urine---will not treat given multi-resistance--dc foley today---resume voiding trial  -try Linzess 9. Obesity. History of gastric bypass  10. Insomnia. Trazodone 50 mg each bedtime     Length of stay, days: 21  Cassandria Anger , MD 08/21/2013, 10:00 AM

## 2013-08-21 NOTE — Progress Notes (Signed)
Physical Therapy Session Note  Patient Details  Name: [name] MRN: [id number] Date of Birth: 1934/10/15  Today's Date: 08/21/2013 Time: 5631-4970 Time Calculation (min): 45 min  Short Term Goals: Week 1:  PT Short Term Goal 1 (Week 1): Pt will be able to complete transfers from bed<>chair with mod A.  PT Short Term Goal 1 - Progress (Week 1): Met PT Short Term Goal 2 (Week 1): Pt will be able to propel wheel chair 25' feet with mod A.  PT Short Term Goal 2 - Progress (Week 1): Progressing toward goal PT Short Term Goal 3 (Week 1): Pt will be able to ambulate 5 feet using the LRAD with +2 total assist PT Short Term Goal 3 - Progress (Week 1): Partly met  Therapy Documentation Precautions:  Precautions Precautions: Cervical;Fall Precaution Comments: Parasthesias Required Braces or Orthoses: Cervical Brace Cervical Brace: Hard collar;At all times Restrictions Weight Bearing Restrictions: No Other Position/Activity Restrictions: no lifting, pushing, or heaving lifting Pain: denies pain  Therapeutic Activity:(15') Transfers sit<->stand S/min-guard assist from w/c into RW multiple times and transfer w/c -> toilet S/min-guard Gait Training:(15') 2 x 130' using RW S/min-guard assist with seated rest in between Therapeutic Exercise:(15') sit<->stand multiple times at parallel bars with mini-squats interspersed.   Therapy/Group: Individual Therapy  Clearence Ped 08/21/2013, 11:16 AM

## 2013-08-22 ENCOUNTER — Inpatient Hospital Stay (HOSPITAL_COMMUNITY): Payer: PRIVATE HEALTH INSURANCE | Admitting: Occupational Therapy

## 2013-08-22 NOTE — Progress Notes (Signed)
Occupational Therapy Session Note  Patient Details  Name: [name] MRN: [id number] Date of Birth: 04-03-1935  Today's Date: 08/22/2013 Time: 0915-1000 Time Calculation (min): 45 min  Skilled Therapeutic Interventions/Progress Updates: Patient scheduled for ADL.  However, upon approach for bathing and dressing, patient expressed urge to void quickly.  As for the bathing and dressing session, she would have likely been Min A for LB bathing if completed EOB in which patient could prop leg one at a time onto bed to wash.  This is how she stated she has completed non shower bathing here at the hospital previously.  However, today, she needed to void and used the 3:1 before session began; so, this clinician helped patient with wiping bottom and cleansing right afterwards, both at the 3:1 and then again in front of the sink.   Focus today was on standing balance (dynamic for self care), transfers, endurance and reaching bilateral feet for bathing and dressing.   [name] was very active and participatory through the whole session and stated toward the end, "This bathing and dressing has really built up my strength having to do all this."    Therapy Documentation Precautions:  Precautions Precautions: Cervical;Fall Precaution Comments: Parasthesias Required Braces or Orthoses: Cervical Brace Cervical Brace: Hard collar;At all times Restrictions Weight Bearing Restrictions: No Other Position/Activity Restrictions: no lifting, pushing, or heaving lifting Pain: Pain Assessment Pain Assessment: No/denies pain  See FIM for current functional status  Therapy/Group: Individual Therapy  Herschell Dimes 08/22/2013, 12:36 PM

## 2013-08-22 NOTE — Progress Notes (Signed)
[name] is a 79 y.o. female [birth year] [id number]  Subjective: C/o urinating too much. No dysuria. Slept well. Feeling OK.  Objective: Vital signs in last 24 hours: Temp:  [97.8 F (36.6 C)-98.3 F (36.8 C)] 97.8 F (36.6 C) (04/12 0406) Pulse Rate:  [74-79] 79 (04/12 0406) Resp:  [18-20] 18 (04/12 0406) BP: (163-164)/(69-80) 163/69 mmHg (04/12 0406) SpO2:  [92 %] 92 % (04/12 0406) Weight change:  Last BM Date: 08/21/13  Intake/Output from previous day: 04/11 0701 - 04/12 0700 In: 840 [P.O.:840] Out: -  Last cbgs: CBG (last 3)  No results found for this basename: GLUCAP,  in the last 72 hours   Physical Exam General: No apparent distress   HEENT: not dry. Ritta Slot is present Lungs: Normal effort. Lungs clear to auscultation, no crackles or wheezes. Cardiovascular: Regular rate and rhythm, no edema Abdomen: S/NT/ND; BS(+) Musculoskeletal:  unchanged Neurological: No new neurological deficits Wounds: N/A    Skin: clear  Aging changes Mental state: Alert, oriented, cooperative    Lab Results: BMET    Component Value Date/Time   NA 139 08/02/2013 0548   K 4.2 08/02/2013 0548   CL 98 08/02/2013 0548   CO2 31 08/02/2013 0548   GLUCOSE 113* 08/02/2013 0548   BUN 10 08/02/2013 0548   CREATININE 0.44* 08/02/2013 0548   CALCIUM 8.9 08/02/2013 0548   GFRNONAA >90 08/02/2013 0548   GFRAA >90 08/02/2013 0548   CBC    Component Value Date/Time   WBC 8.0 08/02/2013 0548   RBC 3.32* 08/02/2013 0548   HGB 10.2* 08/02/2013 0548   HCT 31.6* 08/02/2013 0548   PLT 281 08/02/2013 0548   MCV 95.2 08/02/2013 0548   MCH 30.7 08/02/2013 0548   MCHC 32.3 08/02/2013 0548   RDW 13.9 08/02/2013 0548   LYMPHSABS 2.1 07/26/2013 1415   MONOABS 0.8 07/26/2013 1415   EOSABS 0.2 07/26/2013 1415   BASOSABS 0.0 07/26/2013 1415    Studies/Results: No results found.  Medications: I have reviewed the patient's current medications.  Assessment/Plan:   1. Cervical epidural abscess with  tetraplegia. Status post anterior cervical decompression discectomy multilevel C4-C7 07/16/2013 with subsequent cord compression undergoing cervical 4-7 posterior cervical fusion, C4-C7 cervical laminectomy and C3-T1 levels 07/28/2013  2. DVT Prophylaxis/Anticoagulation: SCDs. lovenox 30 q12. Dopplers negative  3. Pain Management: Neurontin 100 mg 3 times a day, Hydrocodone and Robaxin as needed. Monitor with increased mobility  4.ID/microaerophilic streptococci. Intravenous ceftriaxone through 08/26/2013  5. Neuropsych: This patient is capable of making decisions on her own behalf.  -low dose valium prn for anxiety (limit as much as possible) --discussed with patient  6. Hypertension. Lisinopril 5 mg daily, Lasix 20 mg daily.  7. Asthma. Continue Dulera puffs twice daily as well as Flonase/CPAP  8. Neurogenic bladder/bowel. Foley replaced for retention  -VRE in urine---will not treat given multi-resistance--dc foley today---resume voiding trial  -try Linzess 9. Obesity. History of gastric bypass  10. Insomnia. Trazodone 50 mg each bedtime 11. Frequent urination on Lasix. Incontinent. Will d/c     Length of stay, days: [place] , MD 08/22/2013, 8:38 AM

## 2013-08-23 ENCOUNTER — Inpatient Hospital Stay (HOSPITAL_COMMUNITY): Payer: PRIVATE HEALTH INSURANCE

## 2013-08-23 ENCOUNTER — Inpatient Hospital Stay (HOSPITAL_COMMUNITY): Payer: PRIVATE HEALTH INSURANCE | Admitting: Physical Therapy

## 2013-08-23 DIAGNOSIS — G061 Intraspinal abscess and granuloma: Secondary | ICD-10-CM

## 2013-08-23 DIAGNOSIS — G8250 Quadriplegia, unspecified: Secondary | ICD-10-CM

## 2013-08-23 NOTE — Patient Care Conference (Signed)
Inpatient RehabilitationTeam Conference and Plan of Care Update Date: 08/17/2013   Time: 2:00 PM    Patient Name: [name]      Medical Record Number: [id number]  Date of Birth: 10/21/1934 Sex: Female         Room/Bed: 4W25C/4W25C-01 Payor Info: Payor: [employer] MEDICARE / Plan: [employer] MEDICARE COMPLETE / Product Type: *No Product type* /    Admitting Diagnosis: other  Admit Date/Time:  07/31/2013  5:38 PM Admission Comments: No comment available   Primary Diagnosis:  <principal problem not specified> Principal Problem: <principal problem not specified>  Patient Active Problem List   Diagnosis Date Noted  . Abscess in epidural space of cervical spine 07/30/2013  . Epidural abscess (embolic) of spinal cord 32/35/5732  . Epidural abscess 07/22/2013  . Spinal epidural abscess 07/16/2013  . Acute respiratory failure 07/16/2013  . OSA on CPAP 07/16/2013    Expected Discharge Date: Expected Discharge Date:  (SNF)  Team Members Present: Physician leading conference: Dr. Alger Simons Social Worker Present: Alfonse Alpers, LCSW Nurse Present: Dorthula Nettles, RN PT Present: Ladona Horns, PT OT Present: Salome Spotted, OT;Patricia Lissa Hoard, OT PPS Coordinator present : Daiva Nakayama, RN, CRRN;Becky Alwyn Ren, PT     Current Status/Progress Goal Weekly Team Focus  Medical   continued neuro recovery, bowel and bladder functioning  see prior  improving stamina, balance and proprioception, voiding pattern   Bowel/Bladder   Up to bathroom to void. Continent of bowel and bladder.  Continent of bowel and bladder  Remain continent of bowel on bladder   Swallow/Nutrition/ Hydration   Mod A upper body bathing and dressing (B & D), total A lower B & D, mech lift for transfers  Min A B & D lower body and transfers, Supervision Upper B & D and groomin, mod I self-feeding,   Static and dynamic sitting balance, bed mobility, BUE strengthening, FMC or left hand.   ADL's   Min A UB ADL,  Mod-Max A LB ADL, Min-Mod A SPT, Max A toileting  Overall Min Assist  sit><stand, dynamic standing balance, transfers, endurance, general strengthening   Mobility   min/mod assist ambulation, min assist stand pivot transfers.  supervision transfers, min-guard gait with w/c to follow.   Transfers, balance, proprioception, endurance, NMR, strengthening, safety   Communication             Safety/Cognition/ Behavioral Observations            Pain   prn percocet and 500mg  Robaxin for neck/right shoulder pain.  Pain </=4  Turn q2hr, assist to reposition in wheelchair, offer pain medication prior to therapy   Skin   Incision to neck/back with stained honeycomb dressing in place, blanchable redness to sacrum/buttocks with foam dressing intact  No new skin breakdown  Reposition q4hr in wheelchair, turn patient q 2hr in bed    Rehab Goals Patient on target to meet rehab goals: Yes Rehab Goals Revised: None *See Care Plan and progress notes for long and short-term goals.  Barriers to Discharge: size ongoing balance and proprioceptive deficits    Possible Resolutions to Barriers:  supervision at home. and some physical assist.    Discharge Planning/Teaching Needs:  Pt to tx to SNF initially prior to home with support  None due to SNF tx   Team Discussion:  Pt's endurance is still low after activity and she doesn't have awareness of her fatigue level. Pt is improving and making functional gains.  Revisions to Treatment Plan:  None  Continued Need for Acute Rehabilitation Level of Care: The patient requires daily medical management by a physician with specialized training in physical medicine and rehabilitation for the following conditions: Daily direction of a multidisciplinary physical rehabilitation program to ensure safe treatment while eliciting the highest outcome that is of practical value to the patient.: Yes Daily medical management of patient stability for increased activity during  participation in an intensive rehabilitation regime.: Yes Daily analysis of laboratory values and/or radiology reports with any subsequent need for medication adjustment of medical intervention for : Post surgical problems;Neurological problems;Other  [name] Siddhi Dornbush 08/23/2013, 12:45 PM

## 2013-08-23 NOTE — Progress Notes (Signed)
Occupational Therapy Session Note  Patient Details  Name: [name] MRN: [id number] Date of Birth: 08-13-1934  Today's Date: 08/23/2013 Time: 6546-5035 Time Calculation (min): 56 min  Short Term Goals: Week 3:  OT Short Term Goal 1 (Week 3): Patient will complete upper body bathing with setup OT Short Term Goal 2 (Week 3): Patient will complete upper body dressing with min assist OT Short Term Goal 3 (Week 3): Patient will complete lower body dressing with mod assist OT Short Term Goal 4 (Week 3): Patient will complete 2 of 3 tasks during toileting with steadying assist OT Short Term Goal 5 (Week 3): Patient will complete transfers on/off tub bench with supervision using min verbal cues for safety  Skilled Therapeutic Interventions/Progress Updates: ADL-retraining with focus on seated upper body bathing/dressing and hand strengthening for improved [facility] of right hand.   Patient reported that she was too tired to perform typical shower-level bathing this session with stated preference to bathe at sink, deferring lower body d/t recent assist with care from RN tech.   Patient completed upper body bathing and grooming at sink with setup assist and min assist to pull shirt over her back.   Patient then demonstrated competence with remaining 6 hand strengthening exercises using thera-putty to perform isolated finger strengthening (external and intrinsic muscle groups).   Patient demonstrated mild confusion with exercises and required hand guidance to complete each exercise as instructed.   Patient left in w/c at end of session  with call light and phone(s) within reach.     Therapy Documentation Precautions:  Precautions Precautions: Cervical;Fall Precaution Comments: Parasthesias Required Braces or Orthoses: Cervical Brace Cervical Brace: Hard collar;At all times Restrictions Weight Bearing Restrictions: No Other Position/Activity Restrictions: no lifting, pushing, or heaving  lifting  Vital Signs: Oxygen Therapy SpO2: 97 % O2 Device: None (Room air)  Pain: Pain Assessment Pain Assessment: No/denies pain Pain Score: 0-No pain  See FIM for current functional status  Therapy/Group: Individual Therapy  Salome Spotted 08/23/2013, 11:27 AM

## 2013-08-23 NOTE — Progress Notes (Signed)
[facility] PHYSICAL MEDICINE & REHABILITATION     PROGRESS NOTE    Subjective/Complaints: Has questions again about voice and cervical collar. Also, "[facility] has a bed today, but they don't take IV abx." A 12 point review of systems has been performed and if not noted above is otherwise negative.   Objective: Vital Signs: Blood pressure 140/64, pulse 79, temperature 98.3 F (36.8 C), temperature source Oral, resp. rate 18, weight 115.304 kg (254 lb 3.2 oz), SpO2 97.00%. No results found. No results found for this basename: WBC, HGB, HCT, PLT,  in the last 72 hours No results found for this basename: NA, K, CL, CO, GLUCOSE, BUN, CREATININE, CALCIUM,  in the last 72 hours CBG (last 3)  No results found for this basename: GLUCAP,  in the last 72 hours  Wt Readings from Last 3 Encounters:  08/18/13 115.304 kg (254 lb 3.2 oz)  07/30/13 111.131 kg (245 lb)  07/30/13 111.131 kg (245 lb)    Physical Exam:  Constitutional:  79 year old obese female, no distress. alert  HENT: oral mucosa pink and moist  Head: Normocephalic.  Eyes: EOM are normal.  Neck: Normal range of motion. Neck supple. No thyromegaly present.  Cardiovascular: Normal rate and regular rhythm. No murmurs, rubs, or gallops  Respiratory: Effort normal and breath sounds normal. No respiratory distress.  GI: Soft. Bowel sounds are normal. She exhibits no distension.  Neurological:  patient is alert and a bit anxious. She is oriented x3 followed basic commands. RUE   3+ deltoid, bicep, triceps 3, WE 3+, HI  3+, LUE  3+ to 4- deltoid, bicep, tricep, wrist and HI. LE: RHF, KE and ankle 4/5. LLE 4-/5 proximal to distal. Decreased proprioception all 4's, lower more than upper, right more than left. DTR's 1+  Skin: ant neck CDI  Psych: pleasant, cooperative,   Assessment/Plan: 1. Functional deficits secondary to cervical epidural abscess with myelopathy s/p C4-7 laminectomy and fusion C3-T1, which require 3+ hours per day  of interdisciplinary therapy in a comprehensive inpatient rehab setting. Physiatrist is providing close team supervision and 24 hour management of active medical problems listed below. Physiatrist and rehab team continue to assess barriers to discharge/monitor patient progress toward functional and medical goals.  Continue with c-collar per NS recs.  FIM: FIM - Bathing Bathing Steps Patient Completed: Chest;Right Arm;Left Arm;Abdomen;Right upper leg;Left upper leg;Left lower leg (including foot) (would have been more independent completing on bed and propping up  legs) Bathing: 3: Mod-Patient completes 5-7 4f 10 parts or 50-74% (periarea/buttocks completed after using toilet)  FIM - Upper Body Dressing/Undressing Upper body dressing/undressing steps patient completed: Thread/unthread right sleeve of pullover shirt/dresss;Thread/unthread left sleeve of pullover shirt/dress;Put head through opening of pull over shirt/dress;Pull shirt over trunk (assist by RN to thread IV through sleeve) Upper body dressing/undressing: 4: Min-Patient completed 75 plus % of tasks FIM - Lower Body Dressing/Undressing Lower body dressing/undressing steps patient completed: Thread/unthread right underwear leg;Thread/unthread left underwear leg;Thread/unthread right pants leg;Thread/unthread left pants leg (wore no shoes today; assisted with donning snug pants over tummy) Lower body dressing/undressing: 3: Mod-Patient completed 50-74% of tasks  FIM - Toileting Toileting steps completed by patient: Adjust clothing prior to toileting Toileting Assistive Devices: Grab bar or rail for support Toileting: 1: Total-Patient completed zero steps, helper did all 3 (Due to urgency of bladder, patient did not have time to assist with donning pants before voiding)  FIM - Radio producer Devices: Bedside commode Toilet Transfers: 4-To toilet/BSC:  Min A (steadying Pt. > 75%);4-From toilet/BSC: Min A  (steadying Pt. > 75%)  FIM - Bed/Chair Transfer Bed/Chair Transfer Assistive Devices: HOB elevated;Bed rails;Walker Bed/Chair Transfer: 5: Supine > Sit: Supervision (verbal cues/safety issues);4: Chair or W/C > Bed: Min A (steadying Pt. > 75%)  FIM - Locomotion: Wheelchair Distance: 75 Locomotion: Wheelchair: 1: Total Assistance/staff pushes wheelchair (Pt<25%) FIM - Locomotion: Ambulation Locomotion: Ambulation Assistive Devices: Administrator Ambulation/Gait Assistance: 4: Min assist Locomotion: Ambulation: 2: Travels 50 - 149 ft with minimal assistance (Pt.>75%)  Comprehension Comprehension Mode: Auditory Comprehension: 5-Understands basic 90% of the time/requires cueing < 10% of the time  Expression Expression Mode: Verbal Expression: 5-Expresses basic 90% of the time/requires cueing < 10% of the time.  Social Interaction Social Interaction: 6-Interacts appropriately with others with medication or extra time (anti-anxiety, antidepressant).  Problem Solving Problem Solving: 5-Solves basic 90% of the time/requires cueing < 10% of the time  Memory Memory: 5-Recognizes or recalls 90% of the time/requires cueing < 10% of the time  Medical Problem List and Plan:  1. Cervical epidural abscess with tetraplegia. Status post anterior cervical decompression discectomy multilevel C4-C7 07/16/2013 with subsequent cord compression undergoing cervical 4-7 posterior cervical fusion, C4-C7 cervical laminectomy and C3-T1 levels 07/28/2013  2. DVT Prophylaxis/Anticoagulation: SCDs.   lovenox 30 q12. Dopplers negative 3. Pain Management: Neurontin 100 mg 3 times a day, Hydrocodone and Robaxin as needed. Monitor with increased mobility  4.ID/microaerophilic streptococci. Intravenous ceftriaxone through 08/26/2013  5. Neuropsych: This patient is capable of making decisions on her own behalf.  -low dose valium prn for anxiety (limit as much as possible) --discussed with patient 6. Hypertension.  Lisinopril 5 mg daily---increased to 10mg , continue Lasix 20 mg daily.  7. Asthma. Continue Dulera puffs twice daily as well as Flonase/CPAP  8. Neurogenic bladder/bowel.    -VRE in urine--  -  Urecholine stopped without issue  -moving bowels 9. Obesity. History of gastric bypass  10. Insomnia. Trazodone 50 mg each bedtime 11. Dsyphonia--trach trauma---ENT follow up as an outpt as needed---I'VE REVIEWED THIS WITH PATIENT NUMEROUS TIMES----WILL NEED MORE TIME TO OBSERVE  -in the meantime will request SLP to see for VC exercises and to assess potential dysphagia as well     LOS (Days) 23 A FACE TO FACE EVALUATION WAS PERFORMED  Meredith Staggers 08/23/2013 8:16 AM

## 2013-08-23 NOTE — Progress Notes (Addendum)
Physical Therapy Note  Patient Details  Name: [name] MRN: [id number] Date of Birth: 1935-01-02 Today's Date: 08/23/2013 1435-1530, 55 min individual therapy No pain voiced  Pt asleep at start of session; easily aroused, and anxious to get to toilet to urinate.  Bed mobility with supervision, using rail.  Gait in room to/from toilet and sink with min guard/supervision. After resting in sitting briefly, pt performed gait x 160' with RW, close supervision without a rest break.  neuromuscular re-education via forced use, demo and tactile cues for trunk shortening/lengthening for postural control in sitting, sit>< stand without use of hands on elevated mat, standing task x 3 minutes biased to R, and balance strategy facilitation in standing accepting external perturbations in all directions.  Pt demonstrated L>R ankle strategy, hip strategy after faciliation.  Pt feels RLE may be slightly longer than LLE since bil TKRs.  Frederic Jericho 08/23/2013, 3:17 PM

## 2013-08-23 NOTE — Progress Notes (Signed)
Social Work Patient ID: [name], female   DOB: 05-11-1935, 79 y.o.   MRN: [id number]  CSW met with pt and her brother to update her on team conference discussion. Also spoke with pt's son later to update him.  Pt still feels she will need to go to a SNF as she really doesn't have a 24/7 caregiver.  CSW is pursuing facilities in [place] for pt and await bed offers. Pt is on IV antibiotics through 08-26-13 and this may prevent some SNFs from offering a bed.  CSW will keep pt updated on offers.

## 2013-08-23 NOTE — Progress Notes (Signed)
Physical Therapy Weekly Progress Note  Patient Details  Name: [name] MRN: [id number] Date of Birth: 07/03/1934  Beginning of progress report period: August 16, 2013 End of progress report period: August 23, 2013  Today's Date: 08/23/2013 Time: 0802-0902 and 1000-1030 Time Calculation (min): 60 min and 30 min  Patient has met 3 of 3 short term goals. Pt requires overall supervision-min assist for functional transfers. Pt continues to require w/c follow during ambulation up to 178 ft using RW and min assist due to high falls risk and impaired balance/safety with fatigue.   Patient continues to demonstrate the following deficits: quadriparesis, decreased functional endurance, impaired balance reactions in standing, decreased truncal and LE proprioception, high falls risk and therefore will continue to benefit from skilled PT intervention to enhance overall performance with activity tolerance, balance, postural control, ability to compensate for deficits, functional use of  right upper extremity, right lower extremity, left upper extremity and left lower extremity and awareness.  Patient progressing toward long term goals..  Continue plan of care.  PT Short Term Goals Week 3:  PT Short Term Goal 1 (Week 3): Pt will ambulate x 40' with RW and min assist of one person with w/c to follow PT Short Term Goal 1 - Progress (Week 3): Met PT Short Term Goal 2 (Week 3): Pt will perform stand pivot transfer with RW and min-guard assist PT Short Term Goal 2 - Progress (Week 3): Met PT Short Term Goal 3 (Week 3): Pt will verbalize appropriate caregiver assist or be able to demonstrate appropriate pressure relief for improved skin integrity PT Short Term Goal 3 - Progress (Week 3): Met Week 4:  PT Short Term Goal 1 (Week 4): Pt will ambulate 100 ft or greater using RW with min guard and no w/c follow PT Short Term Goal 2 (Week 4): Pt will perform  transfers from a variety of surfaces using RW with  supervision PT Short Term Goal 3 (Week 4): Pt will negotiate up/down 3 stairs using 2 rails with min assist PT Short Term Goal 4 (Week 4): Pt will propel w/c 100 ft in controlled environment with supervision  Skilled Therapeutic Interventions/Progress Updates:    First Session: Focused on improved safety and endurance with ambulation, RLE strengthening for improved stability with stair negotiation, and standing balance for functional mobility. Pt received sitting in w/c, agreeable to therapy. Pt performed gait training room <> gym 178 ft x 2 using RW with min A and w/c follow with increased lateral sway and increased gait speed/decreased control as pt fatigued, requiring min verbal cues for awareness of need to rest for improved safety with ambulation. In gym, pt performed step ups using 2 rails, leading with RLE for R knee extensor strengthening/stability, 2 x 8 each to 5" step and 7" step with min guard-minA, with 1 seated rest. Pt negotiated up/down three 6" steps using 2 rails and min-modA with step-to pattern leading with LLE ascending/RLE descending. Pt performed standing balance under variety of conditions 2 trials of 10-30 sec each: static standing without UE support, eyes closed without UE support, single limb stance RLE and LLE with BUE support then 1 UE support using RW and supervision to min A to maintain balance. W/c propulsion using BUEs x 25' and min-modA for endurance. Pt left sitting in w/c in room with all needs within reach.   Second Session: Focused on safety with ambulation in home environment and UE/LE strengthening for functional mobility. Pt receiving sitting in w/c  and performed gait training room > gym 178 ft using RW and minA with w/c follow. After seated rest, pt ambulated in ADL apartment 25' in home environment using RW with min guard. Pt performed sit<>stand from edge of bed with supervision using RW. NuStep for strengthening using BUE/BLE Level 3 x 6 min, seat 8. Pt returned to  room sitting in w/c with all needs within reach, awaiting OT session.   Therapy Documentation Precautions:  Precautions Precautions: Cervical;Fall Precaution Comments: Parasthesias Required Braces or Orthoses: Cervical Brace Cervical Brace: Hard collar;At all times Restrictions Weight Bearing Restrictions: No Other Position/Activity Restrictions: no lifting, pushing, or heaving lifting Vital Signs: Oxygen Therapy SpO2: 97 % O2 Device: None (Room air) Pain: Pain Assessment Pain Assessment: No/denies pain Locomotion : Ambulation Ambulation/Gait Assistance: 4: Min assist Wheelchair Mobility Distance: 25'   See FIM for current functional status  Therapy/Group: Individual Therapy  Laretta Alstrom 08/23/2013, 11:55 AM

## 2013-08-24 ENCOUNTER — Inpatient Hospital Stay (HOSPITAL_COMMUNITY): Payer: PRIVATE HEALTH INSURANCE

## 2013-08-24 ENCOUNTER — Inpatient Hospital Stay (HOSPITAL_COMMUNITY): Payer: PRIVATE HEALTH INSURANCE | Admitting: Physical Therapy

## 2013-08-24 DIAGNOSIS — G061 Intraspinal abscess and granuloma: Secondary | ICD-10-CM

## 2013-08-24 DIAGNOSIS — G8250 Quadriplegia, unspecified: Secondary | ICD-10-CM

## 2013-08-24 MED ORDER — RESOURCE THICKENUP CLEAR PO POWD
ORAL | Status: DC | PRN
  Filled 2013-08-24: qty 125

## 2013-08-24 NOTE — Progress Notes (Signed)
[facility]     PROGRESS NOTE    Subjective/Complaints: No new complaints today. Pleased with progress A 12 point review of systems has been performed and if not noted above is otherwise negative.   Objective: Vital Signs: Blood pressure 124/78, pulse 76, temperature 97.3 F (36.3 C), temperature source Oral, resp. rate 18, weight 115.304 kg (254 lb 3.2 oz), SpO2 94.00%. No results found. No results found for this basename: WBC, HGB, HCT, PLT,  in the last 72 hours No results found for this basename: NA, K, CL, CO, GLUCOSE, BUN, CREATININE, CALCIUM,  in the last 72 hours CBG (last 3)  No results found for this basename: GLUCAP,  in the last 72 hours  Wt Readings from Last 3 Encounters:  08/18/13 115.304 kg (254 lb 3.2 oz)  07/30/13 111.131 kg (245 lb)  07/30/13 111.131 kg (245 lb)    Physical Exam:  Constitutional:  79 year old obese female, no distress. alert  HENT: oral mucosa pink and moist  Head: Normocephalic.  Eyes: EOM are normal.  Neck: Normal range of motion. Neck supple. No thyromegaly present.  Cardiovascular: Normal rate and regular rhythm. No murmurs, rubs, or gallops  Respiratory: Effort normal and breath sounds normal. No respiratory distress.  GI: Soft. Bowel sounds are normal. She exhibits no distension.  Neurological:  patient is alert and a bit anxious. She is oriented x3 followed basic commands. RUE   3+ deltoid, bicep, triceps 3, WE 3+, HI  3+, LUE  3+ to 4- deltoid, bicep, tricep, wrist and HI. LE: RHF, KE and ankle 4/5. LLE 4-/5 proximal to distal. Decreased proprioception all 4's, lower more than upper, right more than left. DTR's 1+  Skin: ant neck CDI  Psych: pleasant, cooperative,   Assessment/Plan: 1. Functional deficits secondary to cervical epidural abscess with myelopathy s/p C4-7 laminectomy and fusion C3-T1, which require 3+ hours per day of interdisciplinary therapy in a comprehensive inpatient rehab  setting. Physiatrist is providing close team supervision and 24 hour management of active medical problems listed below. Physiatrist and rehab team continue to assess barriers to discharge/monitor patient progress toward functional and medical goals.  SNF this week after iv abx complete most likely as she's not quite to a level where she can manage with intermittent help   FIM: FIM - Bathing Bathing Steps Patient Completed: Chest;Right Arm;Left Arm;Abdomen Bathing: 6: More than reasonable amount of time  FIM - Upper Body Dressing/Undressing Upper body dressing/undressing steps patient completed: Thread/unthread right sleeve of pullover shirt/dresss;Thread/unthread left sleeve of pullover shirt/dress;Put head through opening of pull over shirt/dress Upper body dressing/undressing: 4: Min-Patient completed 75 plus % of tasks FIM - Lower Body Dressing/Undressing Lower body dressing/undressing steps patient completed: Thread/unthread right underwear leg;Thread/unthread left underwear leg;Thread/unthread right pants leg;Thread/unthread left pants leg (wore no shoes today; assisted with donning snug pants over tummy) Lower body dressing/undressing: 3: Mod-Patient completed 50-74% of tasks  FIM - Toileting Toileting steps completed by patient: Adjust clothing prior to toileting;Performs perineal hygiene;Adjust clothing after toileting Toileting Assistive Devices: Grab bar or rail for support Toileting: 5: Supervision: Safety issues/verbal cues  FIM - Radio producer Devices: Insurance account manager Transfers: 4-To toilet/BSC: Min A (steadying Pt. > 75%);4-From toilet/BSC: Min A (steadying Pt. > 75%)  FIM - Bed/Chair Transfer Bed/Chair Transfer Assistive Devices: Arm rests;Walker Bed/Chair Transfer: 4: Chair or W/C > Bed: Min A (steadying Pt. > 75%);4: Bed > Chair or W/C: Min A (steadying Pt. > 75%)  FIM -  Locomotion: Wheelchair Distance: 25' Locomotion: Wheelchair: 0:  Activity did not occur FIM - Locomotion: Ambulation Locomotion: Ambulation Assistive Devices: Administrator Ambulation/Gait Assistance: 4: Min assist Locomotion: Ambulation: 4: Travels 150 ft or more with minimal assistance (Pt.>75%)  Comprehension Comprehension Mode: Auditory Comprehension: 5-Understands complex 90% of the time/Cues < 10% of the time  Expression Expression Mode: Verbal Expression: 5-Expresses basic 90% of the time/requires cueing < 10% of the time.  Social Interaction Social Interaction: 6-Interacts appropriately with others with medication or extra time (anti-anxiety, antidepressant).  Problem Solving Problem Solving: 5-Solves basic 90% of the time/requires cueing < 10% of the time  Memory Memory: 5-Recognizes or recalls 90% of the time/requires cueing < 10% of the time  Medical Problem List and Plan:  1. Cervical epidural abscess with tetraplegia. Status post anterior cervical decompression discectomy multilevel C4-C7 07/16/2013 with subsequent cord compression undergoing cervical 4-7 posterior cervical fusion, C4-C7 cervical laminectomy and C3-T1 levels 07/28/2013  2. DVT Prophylaxis/Anticoagulation: SCDs.   lovenox 30 q12. Dopplers negative 3. Pain Management: Neurontin 100 mg 3 times a day, Hydrocodone and Robaxin as needed. Monitor with increased mobility  4.ID/microaerophilic streptococci. Intravenous ceftriaxone through 08/26/2013  5. Neuropsych: This patient is capable of making decisions on her own behalf.  -low dose valium prn for anxiety (limit as much as possible) --discussed with patient 6. Hypertension. Lisinopril 5 mg daily---increased to 10mg , continue Lasix 20 mg daily.  7. Asthma. Continue Dulera puffs twice daily as well as Flonase/CPAP  8. Neurogenic bladder/bowel.    -VRE in urine--  -  Urecholine stopped without issue  -moving bowels 9. Obesity. History of gastric bypass  10. Insomnia. Trazodone 50 mg each bedtime 11. Dsyphonia--trach  trauma---ENT follow up as an outpt as needed---I'VE REVIEWED THIS WITH PATIENT NUMEROUS TIMES----WILL NEED MORE TIME TO OBSERVE  -in the meantime will request SLP to see for VC exercises and to assess potential dysphagia as well     LOS (Days) 24 A FACE TO FACE EVALUATION WAS PERFORMED  Meredith Staggers 08/24/2013 8:17 AM

## 2013-08-24 NOTE — Progress Notes (Signed)
Physical Therapy Session Note  Patient Details  Name: [name] MRN: [id number] Date of Birth: Jan 27, 1935  Today's Date: 08/24/2013 Time: 1400-1500 Time Calculation (min): 60 min  Short Term Goals: Week 4:  PT Short Term Goal 1 (Week 4): Pt will ambulate 100 ft or greater using RW with min guard and no w/c follow PT Short Term Goal 2 (Week 4): Pt will perform  transfers from a variety of surfaces using RW with supervision PT Short Term Goal 3 (Week 4): Pt will negotiate up/down 3 stairs using 2 rails with min assist PT Short Term Goal 4 (Week 4): Pt will propel w/c 100 ft in controlled environment with supervision  Skilled Therapeutic Interventions/Progress Updates:    Pt received semi-reclined in bed; asleep but easily aroused. Pt agreeable to session. Supine>sit with HOB flat requiring supervision using bed rails. Toilet transfer with min A using rolling walker. Pt performed multiple sit<>stand transfers with rolling walker requiring supervision, cueing (~50% of transfers) for safe hand placement. Educated pt on L gluteus medius self-stretch to address soreness at posterolateral aspect of L hip.  Performed gait 2x180' in controlled environment with rolling walker requiring close supervision to min guard. Due to noted pt tendency toward R knee buckling during gait, session focused on strengthening R terminal knee extension to increase RLE stance stability. See below for detailed description of therapeutic exercises. Session ended in pt room, where pt was left seated in w/c with nurse tech present to take vitals; all pt needs within reach.  Therapy Documentation Precautions:  Precautions Precautions: Cervical;Fall Precaution Comments: Parasthesias Required Braces or Orthoses: Cervical Brace Cervical Brace: Hard collar;At all times Restrictions Weight Bearing Restrictions: No Other Position/Activity Restrictions: no lifting, pushing, or heaving lifting Pain: Pain Assessment Pain  Assessment: No/denies pain Therapeutic Exercises:  Performed the following therapeutic exercises to increase RLE stance stability:  squats 3x10 reps, 1x8 reps (to pt fatigue) with bilat UE support at rolling walker and multimodal cueing to discourage quadriceps dominance, to encourage co-contraction of hamstrings; seated LAQ with concurrent ball squeeze for VMO activation (focus on final 30 degrees of knee extension); pre-gait activity with focus maintaining R knee extension throughout RLE stance phase, tactile cueing at distal R quadriceps.  See FIM for current functional status  Therapy/Group: Individual Therapy  Malva Cogan Hobble 08/24/2013, 7:07 PM

## 2013-08-24 NOTE — Progress Notes (Signed)
Occupational Therapy Session Note  Patient Details  Name: [name] MRN: [id number] Date of Birth: Mar 03, 1935  Today's Date: 08/24/2013 Time: 0930-1028 Time Calculation (min): 58 min  Short Term Goals: Week 4:  OT Short Term Goal 1 (Week 4): STG=LTG due to anticipated transfer to SNF for continued rehab  Skilled Therapeutic Interventions/Progress Updates: ADL-retraining with focus on endurance, tranfers, LB dressing, safety awareness.   Patient completed bathing in shower but requested use of Denna Haggard mechanical lift for safety due to prolonged coughing with report of weakness.   Patient performed bathing but was unable to maintain standing balance due to weakness when attempting to wash buttocks.   Patient dressed sitting and standing from w/c to RW but required assist to pull up underwear and pants while standing in walking.  Mild decline in function noted during this session which patient related to chronic cough, left lower back pain, and sudden collapse of right knee when fatigued.     Therapy Documentation Precautions:  Precautions Precautions: Cervical;Fall Precaution Comments: Parasthesias Required Braces or Orthoses: Cervical Brace Cervical Brace: Hard collar;At all times Restrictions Weight Bearing Restrictions: No Other Position/Activity Restrictions: no lifting, pushing, or heaving lifting  Pain: Pain Assessment Pain Assessment: 0-10 Pain Score: 5  Pain Type: Chronic pain Pain Location: Hip Pain Orientation: Right;Left Pain Descriptors / Indicators: Aching Pain Intervention(s): RN made aware;Medication (See eMAR);Rest  See FIM for current functional status  Therapy/Group: Individual Therapy  Second session: Time: 1130-1200 Time Calculation (min):  30 min  Pain Assessment: no/denies pain  Skilled Therapeutic Interventions:  Therapeutic exercise with emphasis on B-U/LE strengthening.   Patient performed NuStep X 10 min, level 2, 734 steps completed.    Patient ambulated from w/c to NuStep and back with verbal cues for positioning.   Patient reports realistic assessment of her deficits and states she is unaware of risk of fall and injury if left unsupervised or assisted during mobility.  See FIM for current functional status  Therapy/Group: Individual Therapy  Salome Spotted 08/24/2013, 10:32 AM

## 2013-08-24 NOTE — Evaluation (Signed)
Speech Language Pathology Assessment and Plan  Patient Details  Name: [name] MRN: [id number] Date of Birth: 05/03/1935  SLP Diagnosis: Dysphagia;Voice disorder  Rehab Potential: Excellent ELOS: 5-7 days   Today's Date: 08/24/2013 Time: 0932-3557 Time Calculation (min): 45 min  Problem List:  Patient Active Problem List   Diagnosis Date Noted  . Abscess in epidural space of cervical spine 07/30/2013  . Epidural abscess (embolic) of spinal cord 32/20/2542  . Epidural abscess 07/22/2013  . Spinal epidural abscess 07/16/2013  . Acute respiratory failure 07/16/2013  . OSA on CPAP 07/16/2013   Past Medical History:  Past Medical History  Diagnosis Date  . Hypertension   . Asthma   . Cancer     Skin Cancer  . Sleep apnea   . COPD (chronic obstructive pulmonary disease)    Past Surgical History:  Past Surgical History  Procedure Laterality Date  . Gastric bypass    . Anterior cervical decomp/discectomy fusion N/A 07/16/2013    Procedure: ANTERIOR CERVICAL DECOMPRESSION/DISCECTOMY FUSION mutlipleLEVELS C4-7;  Surgeon: Erline Levine, MD;  Location: [facility];  Service: Neurosurgery;  Laterality: N/A;  . Posterior cervical fusion/foraminotomy N/A 07/28/2013    Procedure: Cervical four-seven  posterior cervical fusion;  Surgeon: Erline Levine, MD;  Location: [facility];  Service: Neurosurgery;  Laterality: N/A;    Assessment / Plan / Recommendation Clinical Impression Pt is a 79 y.o. right-handed female with h/o hypertension, COPD. Admitted 07/16/13 with progressive quadriparesis x3 days. Pt with low-grade fever 102.2 and WBC count of 14,000. MRI and imaging revealed cervical epidural abscess. Underwent emergent anterior cervical decompression discectomy with fusion multilevel C4-C7 3/6. PT/OT evaluations completed 3/7 with recommendations for physical medicine rehabilitation consult. Pt was admitted to inpatient rehabilitation services 3/12 with ongoing therapies. Reports by  therapy staff as well as pt on progressive weakness; MRI and imaging completed showing cord compression. Neurosurgery recommendations were made to return for surgical intervention and underwent cervical 4-7 posterior cervical fusion with lateral mass screws, posterior lateral arthrodesis C4-C7 levels and cervical laminectomy C3-T1 3/18. Pt was admitted for comprehensive rehabilitation program. Pt reports hoarseness and coughing with thin liquids since second surgery 3/18 with no improvement since that time. Pt observed with strong, prolonged cough response after medication administration this morning. PO trials were limited due to coughing episode, however pt was observed to cough immediately following thin and nectar thick liquids. Recommend FEES to objectively evaluate swallowing function.   Skilled Therapeutic Interventions          SLP bedside swallow evaluation and brief voice screen were completed, with results and recommendations reviewed with patient and son.   SLP Assessment  Patient will need skilled Speech Lanaguage Pathology Services during CIR admission    Recommendations  Recommended Consults: FEES Diet Recommendations: Regular;Thin liquid Liquid Administration via: Cup;Straw Medication Administration: Whole meds with puree Supervision: Patient able to self feed Compensations: Slow rate;Small sips/bites Postural Changes and/or Swallow Maneuvers: Seated upright 90 degrees Oral Care Recommendations: Oral care BID Patient destination: [facility] (SNF) Follow up Recommendations: Skilled Nursing facility Equipment Recommended: None recommended by SLP    SLP Frequency 5 out of 7 days   SLP Treatment/Interventions Cueing hierarchy;Dysphagia/aspiration precaution training;Patient/family education    Pain Pain Assessment Pain Assessment: 0-10 Pain Score: 5  Pain Type: Chronic pain Pain Location: Hip Pain Orientation: Right;Left Pain Descriptors / Indicators:  Aching Pain Intervention(s): RN made aware;Medication (See eMAR);Rest Prior Functioning    Short Term Goals: Week 1: SLP Short  Term Goal 1 (Week 1): Pt will participate in objective swallowing evaluation to asess oropharyngeal swallowing function SLP Short Term Goal 2 (Week 1): Pt will verbalize at least two vocal hygiene strategies with supervision level cueing  See FIM for current functional status Refer to Care Plan for Long Term Goals  Recommendations for other services: None  Discharge Criteria: Patient will be discharged from SLP if patient refuses treatment 3 consecutive times without medical reason, if treatment goals not met, if there is a change in medical status, if patient makes no progress towards goals or if patient is discharged from hospital.  The above assessment, treatment plan, treatment alternatives and goals were discussed and mutually agreed upon: by patient and by family   Germain Osgood, M.A. CCC-SLP [phone]  Germain Osgood 08/24/2013, 12:26 PM

## 2013-08-24 NOTE — Progress Notes (Signed)
Occupational Therapy Weekly Progress Note  Patient Details  Name: MYLINH CRAGG MRN: [id number] Date of Birth: Mar 25, 1935  Beginning of progress report period: August 16, 2013 End of progress report period: August 23, 2013  Today's Date: 08/24/2013  Patient has met 5 of 5 short term goals.    Patient continues to demonstrate the following deficits: Impaired dynamic sitting/standing balance, impaired R-hand [place], impaired endurance and therefore will continue to benefit from skilled OT intervention to enhance overall performance with BADL.  Patient progressing toward long term goals..  Continue plan of care.  OT Short Term Goals Week 3:  OT Short Term Goal 1 (Week 3): Patient will complete upper body bathing with setup OT Short Term Goal 1 - Progress (Week 3): Met OT Short Term Goal 2 (Week 3): Patient will complete upper body dressing with min assist OT Short Term Goal 2 - Progress (Week 3): Met OT Short Term Goal 3 (Week 3): Patient will complete lower body dressing with mod assist OT Short Term Goal 3 - Progress (Week 3): Met OT Short Term Goal 4 (Week 3): Patient will complete 2 of 3 tasks during toileting with steadying assist OT Short Term Goal 4 - Progress (Week 3): Met OT Short Term Goal 5 (Week 3): Patient will complete transfers on/off tub bench with supervision using min verbal cues for safety OT Short Term Goal 5 - Progress (Week 3): Met Week 4:  OT Short Term Goal 1 (Week 4): STG=LTG due to anticipated transfer to SNF for continued rehab   Therapy Documentation Precautions:  Precautions Precautions: Cervical;Fall Precaution Comments: Parasthesias Required Braces or Orthoses: Cervical Brace Cervical Brace: Hard collar;At all times Restrictions Weight Bearing Restrictions: No Other Position/Activity Restrictions: no lifting, pushing, or heaving lifting  Vital Signs: Therapy Vitals Temp: 97.3 F (36.3 C) Temp src: Oral Pulse Rate: 76 Resp: 18 BP: 124/78  mmHg Patient Position, if appropriate: Lying Oxygen Therapy SpO2: 94 % O2 Device: None (Room air)  Pain: No/denies pain   See FIM for current functional status   Salome Spotted 08/24/2013, 7:26 AM

## 2013-08-24 NOTE — Patient Care Conference (Signed)
Inpatient RehabilitationTeam Conference and Plan of Care Update Date: 08/24/2013   Time: 2:00  PM    Patient Name: [name]      Medical Record Number: [id number]  Date of Birth: 21-Feb-1935 Sex: Female         Room/Bed: 4W25C/4W25C-01 Payor Info: Payor: [employer] MEDICARE / Plan: A[employer] MEDICARE COMPLETE / Product Type: *No Product type* /    Admitting Diagnosis: other  Admit Date/Time:  07/31/2013  5:38 PM Admission Comments: No comment available   Primary Diagnosis:  <principal problem not specified> Principal Problem: <principal problem not specified>  Patient Active Problem List   Diagnosis Date Noted  . Abscess in epidural space of cervical spine 07/30/2013  . Epidural abscess (embolic) of spinal cord 24/26/8341  . Epidural abscess 07/22/2013  . Spinal epidural abscess 07/16/2013  . Acute respiratory failure 07/16/2013  . OSA on CPAP 07/16/2013    Expected Discharge Date: Expected Discharge Date: 08/26/13 (to SNF)  Team Members Present: Physician leading conference: Dr. Alger Simons Social Worker Present: Lennart Pall, LCSW Nurse Present: Rayetta Humphrey, RN PT Present: Canary Brim, PT OT Present: Salome Spotted, Starling Manns, OT SLP Present: Weston Anna, SLP PPS Coordinator present : Daiva Nakayama, RN, CRRN;Becky Alwyn Ren, PT     Current Status/Progress Goal Weekly Team Focus  Medical   continued phonation issues, coughing on liquids, occasional back pain. motor exam improving  stabilize medically for dc  swallowing and phonation   Bowel/Bladder   continent of bowel and bladder  remain continent  remain continent   Swallow/Nutrition/ Hydration   regular textures and thin liquids, coughing noted with thin liquids  Mod I with least restrictive PO  FEES   ADL's   MIn A UB ADL, Mod - Max A LB ADL, supervision SPT, Min A toileting  Overall Min Assist  endurance, safety awareness, dynamic standing balance, general strengthening.   Mobility   supervision-min A with ambulation, min A with transfers  supervision with transfers and w/c mobility; min A with gait  Functional transfers, gait stability, dynamic standing balance, safety awareness, activity tolerance, R knee stability with functional mobility   Communication             Safety/Cognition/ Behavioral Observations            Pain   c/o pain to right shoulder/back. relieved by percocet prn  pain less than 4  pre medicate for therapy   Skin   dressing intact to post neck area.   no skin breakdown  turn q 2 prn    Rehab Goals Patient on target to meet rehab goals: Yes Rehab Goals Revised: None *See Care Plan and progress notes for long and short-term goals.  Barriers to Discharge: see prior,    Possible Resolutions to Barriers:  continued help after dc    Discharge Planning/Teaching Needs:  Pt to tx to SNF initially prior to home with support  None due to SNF tx   Team Discussion:  Slight decline in overall function?  FEES planned for this afternoon.  Some increased hip/back pain/ spasms.  Pt medically ready for d/c to SNF after abx complete.  Target for d/c 4/16  Revisions to Treatment Plan:  None   Continued Need for Acute Rehabilitation Level of Care: The patient requires daily medical management by a physician with specialized training in physical medicine and rehabilitation for the following conditions: Daily direction of a multidisciplinary physical rehabilitation program to ensure safe treatment while eliciting the highest outcome  that is of practical value to the patient.: Yes Daily medical management of patient stability for increased activity during participation in an intensive rehabilitation regime.: Yes Daily analysis of laboratory values and/or radiology reports with any subsequent need for medication adjustment of medical intervention for : Post surgical problems;Neurological problems  Lennart Pall 08/24/2013, 9:14 PM

## 2013-08-24 NOTE — Procedures (Signed)
Objective Swallowing Evaluation: Fiberoptic Endoscopic Evaluation of Swallowing  Patient Details  Name: [name] MRN: [id number] Date of Birth: 1934-08-17  Today's Date: 08/24/2013 Time: 4259-5638 Time Calculation (min): 59  Past Medical History:  Past Medical History  Diagnosis Date  . Hypertension   . Asthma   . Cancer     Skin Cancer  . Sleep apnea   . COPD (chronic obstructive pulmonary disease)    Past Surgical History:  Past Surgical History  Procedure Laterality Date  . Gastric bypass    . Anterior cervical decomp/discectomy fusion N/A 07/16/2013    Procedure: ANTERIOR CERVICAL DECOMPRESSION/DISCECTOMY FUSION mutlipleLEVELS C4-7;  Surgeon: Erline Levine, MD;  Location: [facility];  Service: Neurosurgery;  Laterality: N/A;  . Posterior cervical fusion/foraminotomy N/A 07/28/2013    Procedure: Cervical four-seven  posterior cervical fusion;  Surgeon: Erline Levine, MD;  Location: [facility];  Service: Neurosurgery;  Laterality: N/A;   HPI:  Pt is a 79 y.o. female admitted 07/16/13 with progressive quadriparesis x3 days.  MRI and imaging revealed cervical epidural abscess. Underwent emergent anterior cervical decompression discectomy with fusion multilevel C4-C7 3/6. Pt was admitted to inpatient rehabilitation services 3/12 with ongoing therapies. Cord compression per repeat MRI resulted in return for surgical intervention.  Underwent cervical 4-7 posterior cervical fusion with lateral mass screws, posterior lateral arthrodesis C4-C7 levels and cervical laminectomy C3-T1 3/18. Pt was readmitted to CIR for comprehensive rehabilitation program. Pt reports hoarseness and coughing with thin liquids since second surgery 3/18 with no improvement since that time. Clinical swallow evaluation 4/15 led to concerns for dysphagia and recs for FEES.     Recommendation/Prognosis  Clinical Impression:   Dysphagia Diagnosis: Moderate pharyngeal phase dysphagia Clinical impression: Pt  presents with dysphagia secondary to left vocal fold paralysis.  There is adequate oral preparation of material, timeliness of swallow response, and only trace pharyngeal residue post-swallow.  Thin liquids are aspirated secondary to vocal fold immobility: in small quantities, liquids only reach the vocal cords, and pt's spontaneous cough response ejects them from larynx.  In larger bolus sizes, they pass below the level of the cords and pt's cough is not protective.  Nectar-thick liquids penetrate the larynx, but coughing/throat-clearing  removes penetrate.    Given cervical vertebrae precautions, postural adjustments could not be attempted.  Solid foods were tolerated well.    Recommend dysphagia 3/nectar-thick liquid diet; meds whole in puree.  Pt may have small sips of thin liquid between meals.  Pt is cognizant of deficits and understands the precautions.  She and her son watched video of procedure in real time; we discussed results and recs.    Swallow Evaluation Recommendations:  Recommended Consults: Consider ENT evaluation Diet Recommendations: Dysphagia 3 (Mechanical Soft);Nectar-thick liquid (may have thins sips outside of meals - small sips) Liquid Administration via: Cup;Straw Medication Administration: Whole meds with puree Supervision: Patient able to self feed Compensations: Small sips/bites;Clear throat intermittently Postural Changes and/or Swallow Maneuvers: Seated upright 90 degrees Oral Care Recommendations: Oral care BID Other Recommendations: Order thickener from pharmacy    Prognosis:      Individuals Consulted: Consulted and Agree with Results and Recommendations: Patient;Family member/caregiver Family Member Consulted: son      SLP Assessment/Plan  Plan:  Speech Therapy Frequency: min 2x/week   Short Term Goals: Week 1: SLP Short Term Goal 1 (Week 1): Pt will participate in objective swallowing evaluation to asess oropharyngeal swallowing function SLP Short  Term Goal 1 - Progress (Week 1): Met  SLP Short Term Goal 2 (Week 1): Pt will verbalize at least two vocal hygiene strategies with supervision level cueing SLP Short Term Goal 2 - Progress (Week 1): Progressing toward goal SLP Short Term Goal 3 (Week 1): Pt will utilize strategies to maintain swallowing safety with mod I level cueing.    General: Date of Onset: 07/28/13 Type of Study: Fiberoptic Endoscopic Evaluation of Swallowing Reason for Referral: Objectively evaluate swallowing function Diet Prior to this Study: Regular;Thin liquids Temperature Spikes Noted: No Respiratory Status: Room air History of Recent Intubation: Yes Length of Intubations (days): 1 days Date extubated: 07/28/13 Behavior/Cognition: Alert;Cooperative;Pleasant mood Oral Cavity - Dentition: Adequate natural dentition Self-Feeding Abilities: Able to feed self Patient Positioning: Upright in chair Baseline Vocal Quality: Hoarse;Low vocal intensity Volitional Cough: Strong Volitional Swallow: Able to elicit Anatomy: Other (Comment) (paralysis left vocal fold) Pharyngeal Secretions: Normal   Reason for Referral:   Objectively evaluate swallowing function    Oral Phase: Oral Preparation/Oral Phase Oral Phase: WFL   Pharyngeal Phase:  Pharyngeal Phase Pharyngeal Phase: Impaired Pharyngeal - Honey Pharyngeal - Honey Teaspoon: Within functional limits Pharyngeal - Nectar Pharyngeal - Nectar Cup: Reduced airway/laryngeal closure;Penetration/Aspiration during swallow Penetration/Aspiration details (nectar cup): Material enters airway, remains ABOVE vocal cords then ejected out Pharyngeal - Nectar Straw: Reduced airway/laryngeal closure;Trace aspiration;Penetration/Aspiration during swallow Penetration/Aspiration details (nectar straw): Material enters airway, CONTACTS cords then ejected out Pharyngeal - Thin Pharyngeal - Thin Teaspoon: Reduced airway/laryngeal closure;Penetration/Aspiration during  swallow Penetration/Aspiration details (thin teaspoon): Material enters airway, CONTACTS cords then ejected out Pharyngeal - Thin Cup: Reduced airway/laryngeal closure;Trace aspiration;Penetration/Aspiration during swallow Penetration/Aspiration details (thin cup): Material enters airway, passes BELOW cords and not ejected out despite cough attempt by patient Pharyngeal - Solids Pharyngeal - Puree: Within functional limits Pharyngeal - Regular: Within functional limits                  Assunta Curtis 08/24/2013, 6:00 PM

## 2013-08-25 ENCOUNTER — Inpatient Hospital Stay (HOSPITAL_COMMUNITY): Payer: PRIVATE HEALTH INSURANCE

## 2013-08-25 ENCOUNTER — Inpatient Hospital Stay (HOSPITAL_COMMUNITY): Payer: PRIVATE HEALTH INSURANCE | Admitting: Physical Therapy

## 2013-08-25 DIAGNOSIS — G8250 Quadriplegia, unspecified: Secondary | ICD-10-CM

## 2013-08-25 DIAGNOSIS — G061 Intraspinal abscess and granuloma: Secondary | ICD-10-CM

## 2013-08-25 NOTE — Progress Notes (Signed)
Speech Language Pathology Daily Session Note  Patient Details  Name: [name] MRN: [id number] Date of Birth: February 03, 1935  Today's Date: 08/25/2013 Time: 0830-0900 Time Calculation (min): 30 min  Short Term Goals: Week 1: SLP Short Term Goal 1 (Week 1): Pt will participate in objective swallowing evaluation to asess oropharyngeal swallowing function SLP Short Term Goal 1 - Progress (Week 1): Met SLP Short Term Goal 2 (Week 1): Pt will verbalize at least two vocal hygiene strategies with supervision level cueing SLP Short Term Goal 2 - Progress (Week 1): Progressing toward goal SLP Short Term Goal 3 (Week 1): Pt will utilize strategies to maintain swallowing safety with mod I level cueing.  Skilled Therapeutic Interventions: Skilled treatment focused on education related to voice and swallowing goals. SLP facilitated session with education regarding vocal hygiene and safe swallowing recommendations. Pt recalled results of FEES 4/14 with Mod I. She generated 3 ways to improve vocal hygiene with supervision level question cues. Pt thickened liquids with SLP instruction, and drank thickened liquids with no overt s/s of aspiration. Continue plan of care.   FIM:  Comprehension Comprehension Mode: Auditory Comprehension: 6-Follows complex conversation/direction: With extra time/assistive device Expression Expression Mode: Verbal Expression: 5-Expresses complex 90% of the time/cues < 10% of the time Social Interaction Social Interaction: 6-Interacts appropriately with others with medication or extra time (anti-anxiety, antidepressant). Problem Solving Problem Solving: 6-Solves complex problems: With extra time Memory Memory: 6-More than reasonable amt of time FIM - Eating Eating Activity: 6: Swallowing techniques: self-managed;6: Modified consistency diet: (comment) (Dys 3/nectar)  Pain Pain Assessment Pain Assessment: No/denies pain Pain Score: 0-No pain  Therapy/Group:  Individual Therapy   Germain Osgood, M.A. CCC-SLP [phone]  Germain Osgood 08/25/2013, 10:48 AM

## 2013-08-25 NOTE — Progress Notes (Signed)
Occupational Therapy Session Note  Patient Details  Name: [name] MRN: [id number] Date of Birth: 03-19-1935  Today's Date: 08/25/2013 Time: 0930-1030 Time Calculation (min): 60 min  Short Term Goals: Week 4:  OT Short Term Goal 1 (Week 4): STG=LTG due to anticipated transfer to SNF for continued rehab  Skilled Therapeutic Interventions/Progress Updates: ADL-retraining with focus on use of AE to improve independence with lower body dressing.   Patient received in her w/c, requesting bathing at sink this session.  Patient completed bathing with min assist to wash feet and was instructed on use of elastic shoe laces, long shoe horn, sock aid, and reacher to don socks, shoes, pants and underwear while seated in w/c.   Patient was able to maintain dynamic sitting balance to use AE during this session with instructional cues provided and steadying assist while she pulled up her pants at sink, and min assist to pull up pants over posterior (pt = 90%) after donning underwear with pants using reacher.  Patient confirmed her intent to purchase AE during next session at gift shop.     Therapy Documentation Precautions:  Precautions Precautions: Cervical;Fall Precaution Comments: Parasthesias Required Braces or Orthoses: Cervical Brace Cervical Brace: Hard collar;At all times Restrictions Weight Bearing Restrictions: No Other Position/Activity Restrictions: no lifting, pushing, or heaving lifting  Pain: Pain Assessment Pain Assessment: 0-10 Pain Score: 4  Pain Type: Chronic pain Pain Location: Generalized Pain Descriptors / Indicators: Aching Pain Frequency: Intermittent Pain Onset: Gradual Patients Stated Pain Goal: 3 Pain Intervention(s): Medication (See eMAR);Repositioned;Emotional support Multiple Pain Sites: No  See FIM for current functional status  Therapy/Group: Individual Therapy  Salome Spotted 08/25/2013, 3:58 PM

## 2013-08-25 NOTE — Progress Notes (Signed)
[facility]     PROGRESS NOTE    Subjective/Complaints: Slept well. Had questions about swallowing and collar  A 12 point review of systems has been performed and if not noted above is otherwise negative.   Objective: Vital Signs: Blood pressure 167/84, pulse 74, temperature 98 F (36.7 C), temperature source Oral, resp. rate 18, weight 113.399 kg (250 lb), SpO2 94.00%. No results found. No results found for this basename: WBC, HGB, HCT, PLT,  in the last 72 hours No results found for this basename: NA, K, CL, CO, GLUCOSE, BUN, CREATININE, CALCIUM,  in the last 72 hours CBG (last 3)  No results found for this basename: GLUCAP,  in the last 72 hours  Wt Readings from Last 3 Encounters:  08/25/13 113.399 kg (250 lb)  07/30/13 111.131 kg (245 lb)  07/30/13 111.131 kg (245 lb)    Physical Exam:  Constitutional:  79 year old obese female, no distress. alert  HENT: oral mucosa pink and moist  Head: Normocephalic.  Eyes: EOM are normal.  Neck: Normal range of motion. Neck supple. No thyromegaly present.  Cardiovascular: Normal rate and regular rhythm. No murmurs, rubs, or gallops  Respiratory: Effort normal and breath sounds normal. No respiratory distress.  GI: Soft. Bowel sounds are normal. She exhibits no distension.  Neurological:  patient is alert and a bit anxious. She is oriented x3 followed basic commands. RUE   3+ deltoid, bicep, triceps 3, WE 3+, HI  3+, LUE  3+ to 4- deltoid, bicep, tricep, wrist and HI. LE: RHF, KE and ankle 4/5. LLE 4-/5 proximal to distal. Decreased proprioception all 4's, lower more than upper, right more than left. DTR's 1+  Skin: ant neck CDI  Psych: pleasant, cooperative,   Assessment/Plan: 1. Functional deficits secondary to cervical epidural abscess with myelopathy s/p C4-7 laminectomy and fusion C3-T1, which require 3+ hours per day of interdisciplinary therapy in a comprehensive inpatient rehab  setting. Physiatrist is providing close team supervision and 24 hour management of active medical problems listed below. Physiatrist and rehab team continue to assess barriers to discharge/monitor patient progress toward functional and medical goals.  SNF this week after iv abx complete most likely as she's not quite to a level where she can manage with intermittent help   FIM: FIM - Bathing Bathing Steps Patient Completed: Chest;Right Arm;Left Arm;Abdomen Bathing: 6: More than reasonable amount of time  FIM - Upper Body Dressing/Undressing Upper body dressing/undressing steps patient completed: Thread/unthread right sleeve of pullover shirt/dresss;Thread/unthread left sleeve of pullover shirt/dress;Put head through opening of pull over shirt/dress Upper body dressing/undressing: 4: Min-Patient completed 75 plus % of tasks FIM - Lower Body Dressing/Undressing Lower body dressing/undressing steps patient completed: Thread/unthread right underwear leg;Thread/unthread left underwear leg;Thread/unthread right pants leg;Thread/unthread left pants leg (wore no shoes today; assisted with donning snug pants over tummy) Lower body dressing/undressing: 3: Mod-Patient completed 50-74% of tasks  FIM - Toileting Toileting steps completed by patient: Adjust clothing prior to toileting;Performs perineal hygiene;Adjust clothing after toileting Toileting Assistive Devices: Grab bar or rail for support Toileting: 5: Supervision: Safety issues/verbal cues  FIM - Radio producer Devices: Insurance account manager Transfers: 4-To toilet/BSC: Min A (steadying Pt. > 75%);4-From toilet/BSC: Min A (steadying Pt. > 75%)  FIM - Bed/Chair Transfer Bed/Chair Transfer Assistive Devices: Arm rests;Bed rails;Walker Bed/Chair Transfer: 5: Supine > Sit: Supervision (verbal cues/safety issues);5: Chair or W/C > Bed: Supervision (verbal cues/safety issues);5: Bed > Chair or W/C: Supervision (verbal  cues/safety issues)  FIM - Locomotion: Wheelchair Distance: 25' Locomotion: Wheelchair: 0: Activity did not occur FIM - Locomotion: Ambulation Locomotion: Ambulation Assistive Devices: Administrator Ambulation/Gait Assistance: 4: Min guard Locomotion: Ambulation: 4: Travels 150 ft or more with minimal assistance (Pt.>75%)  Comprehension Comprehension Mode: Auditory Comprehension: 6-Follows complex conversation/direction: With extra time/assistive device  Expression Expression Mode: Verbal Expression: 6-Expresses complex ideas: With extra time/assistive device  Social Interaction Social Interaction: 6-Interacts appropriately with others with medication or extra time (anti-anxiety, antidepressant).  Problem Solving Problem Solving: 5-Solves basic problems: With no assist  Memory Memory: 6-More than reasonable amt of time  Medical Problem List and Plan:  1. Cervical epidural abscess with tetraplegia. Status post anterior cervical decompression discectomy multilevel C4-C7 07/16/2013 with subsequent cord compression undergoing cervical 4-7 posterior cervical fusion, C4-C7 cervical laminectomy and C3-T1 levels 07/28/2013   -check 2v xrays today to see if we can clear collar 2. DVT Prophylaxis/Anticoagulation: SCDs.   lovenox 30 q12. Dopplers negative 3. Pain Management: Neurontin 100 mg 3 times a day, Hydrocodone and Robaxin as needed. Monitor with increased mobility  4.ID/microaerophilic streptococci. Intravenous ceftriaxone through 08/26/2013  5. Neuropsych: This patient is capable of making decisions on her own behalf.  -low dose valium prn for anxiety (limit as much as possible) --discussed with patient 6. Hypertension. Lisinopril 5 mg daily---increased to 10mg , continue Lasix 20 mg daily.  7. Asthma. Continue Dulera puffs twice daily as well as Flonase/CPAP  8. Neurogenic bladder/bowel.    -VRE in urine--  -  Urecholine stopped without issue  -moving bowels 9. Obesity.  History of gastric bypass  10. Insomnia. Trazodone 50 mg each bedtime 11. Dysphonia with dysphagia--left vocal cord paralysis on FEES.   -will schedule outpt ENT follow up. Not much that can be done at present  -removal of collar may help with swallowing compensation strategies  -appreciate SLP help     LOS (Days) 25 A FACE TO FACE EVALUATION WAS PERFORMED  Meredith Staggers 08/25/2013 8:37 AM

## 2013-08-25 NOTE — Progress Notes (Signed)
Physical Therapy Note  Patient Details  Name: [name] MRN: [id number] Date of Birth: 12-13-1934 Today's Date: 08/25/2013  Time: 1030-1125 55 minutes  1:1 No c/o pain.  Gait with RW in controlled environment supervision 180'.  Pt continues with trendelenberg gait due to R LE weakness.  Obstacle negotiation with cues for safety with RW when stepping over obstacles, occasional min A for balance with tight turns, cues to slow down and take small steps to turn vs pivoting on LEs.  Standing [place] HEP performed with focus on strengthening and improving balance to prevent falls.  Pt able to perform with multiple rest breaks due to R LE fatigue.  Pt encouraged to take rests when R LE feels fatigued vs "pushing through" and increasing chances of falling.  Step ups to 4'' step for R LE concentric and eccentric control with min/mod A needed for eccentric quad control.  W/c mobility with min A for steering, 100'.  Time 2: 1300-1345 45 minutes  1:1 No c/o pain.  Gait training for endurance and strengthening 180', 100' with close supervision.  Standing balance and strengthening activity with tapping cones and tap ups on 4'' step. Pt requires verbal and tactile cues for glute and quad contraction on R, emphasized importance of contracting R LE mm before lifting L LE, pt improves with repetition.  Nu step for UE/LE strength and endurance x 10 minutes level 3.  Pt states she is pleased with progress.   Kennith Gain 08/25/2013, 11:23 AM

## 2013-08-25 NOTE — Progress Notes (Signed)
Social Work Patient ID: [name], female   DOB: 1934-11-23, 79 y.o.   MRN: [id number]   Met briefly with pt's son yesterday afternoon to review team conference.  Son aware that MD reports pt should finish IVabx Thursday am and could d/c to SNF following if bed available.  Sonia Baller Prevatt, LCSW to follow up further today.  Lennart Pall, LCSW

## 2013-08-26 ENCOUNTER — Inpatient Hospital Stay (HOSPITAL_COMMUNITY): Payer: PRIVATE HEALTH INSURANCE | Admitting: Physical Therapy

## 2013-08-26 ENCOUNTER — Inpatient Hospital Stay (HOSPITAL_COMMUNITY): Payer: PRIVATE HEALTH INSURANCE

## 2013-08-26 NOTE — Progress Notes (Signed)
Physical Therapy Note  Patient Details  Name: [name] MRN: [id number] Date of Birth: 03/04/1935 Today's Date: 08/26/2013  Time: 1030-1125 55 minutes  1:1 No c/o pain.  Gait with RW in home and controlled environments at supervision level, cues for obstacle negotiation.  Standing [place] HEP for LE strengthening and balance, pt able to perform with cuing, frequent rests.  Standing tap ups to 4'' cone with focus on R LE control and stability, pt requires min A for standing balance with RW, tactile and verbal cues for hip/glute contraction on R.  Stair negotiation x 6 stairs with B handrails with supervision.  W/c mobility in controlled environment with supervision/cuing for tight turns. Pt reports she feels ready to d/c to next level of care.   [name] 08/26/2013, 11:21 AM

## 2013-08-26 NOTE — Progress Notes (Signed)
Occupational Therapy Session Note  Patient Details  Name: [name] MRN: [id number] Date of Birth: 09/12/1934  Today's Date: 08/26/2013 Time: 0930-1030 Time Calculation (min): 60 min  Short Term Goals: Week 4:  OT Short Term Goal 1 (Week 4): STG=LTG due to anticipated transfer to SNF for continued rehab  Skilled Therapeutic Interventions/Progress Updates: ADL-retraining with focus on review and practice with performance of functional mobility, transfers (bed, toilet, shower), toileting, use of AE and DME.   Patient completed ADL at shower with setup and supervision, dressing with min assist and transfers with steadying assist for safety.   Patient reports improved endurance and confidence but is aware of remaining challenge to dynamic standing balance when fatigued.    Therapy Documentation Precautions:  Precautions Precautions: Cervical;Fall Precaution Comments: Parasthesias Required Braces or Orthoses: Cervical Brace Cervical Brace: Hard collar;At all times Restrictions Weight Bearing Restrictions: No Other Position/Activity Restrictions: no lifting, pushing, or heaving lifting  Pain: No/denies pain  See FIM for current functional status  Therapy/Group: Individual Therapy  Salome Spotted 08/26/2013, 3:53 PM

## 2013-08-26 NOTE — Discharge Summary (Signed)
NAMEMarland Kitchen  [name], [name] NO.:  [id number]  MEDICAL RECORD NO.:  [id number]  LOCATION:  4W25C                        FACILITY:  [facility]  PHYSICIAN:  [name], M.D.DATE OF BIRTH:  Sep 30, 1934  DATE OF ADMISSION:  07/31/2013 DATE OF DISCHARGE:  08/26/2013                              DISCHARGE SUMMARY   DISCHARGE DIAGNOSES: 1. Cervical epidural abscess with tetraplegia status post     decompression diskectomy. 2. Sequential compression devices for deep vein thrombosis     prophylaxis. 3. Pain management. 4. Hypertension. 5. Asthma. 6. Neurogenic bowel and bladder. 7. Vancomycin-resistant Enterococcus urine. 8. Obesity. 9. Insomnia. 10.Dysphonia with dysphagia.  HISTORY OF PRESENT ILLNESS:  This is a 79 year old right-handed female admitted on July 16, 2013, with progressive quadriparesis x3 days. Denied bowel or bladder disturbances.  The patient with low-grade fever 102.2, white blood cell count 14,000.  MRI and imaging revealed a cervical epidural abscess.  Underwent an emergent anterior cervical decompression diskectomy with fusion multilevel C4-C7 on July 16, 2013, per Dr. Vertell Limber.  Decadron protocol as advised.  Follow up Infectious Disease with spinal abscess fluid rolling microaerophilic streptococci and maintained on ceftriaxone as well as vancomycin.  Postoperative pain management.  She remained on intravenous ceftriaxone through August 26, 2013.  Physical and Occupational Therapy evaluations completed.  She was admitted to Inpatient Rehab Services on July 22, 2013.  Reports by staff as well as the patient on progressive weakness, diminished triceps and hand intrinsic strength.  MRI and imaging completed showing cord compression.  Neurosurgery consulted, returned to the operating room for surgical intervention, underwent cervical 4-7 posterior cervical fusion with lateral mass screws, posterolateral arthrodesis on July 28, 2013, per Dr. Vertell Limber.   She remained in a hard cervical collar.  She was readmitted for comprehensive rehab program.  PAST MEDICAL HISTORY:  See discharge diagnoses.  SOCIAL HISTORY:  Lives alone.  FUNCTIONAL HISTORY:  Prior to hospital admission, was independent. Functional status upon admission to [facility] was ambulating 2 feet.  The patient is very unsteady on her feet.  Difficulty moving lower extremities, poor proprioception.  PHYSICAL EXAMINATION:  VITAL SIGNS:  Blood pressure 136/60, pulse 69, temperature 97.3, respirations 18. GENERAL:  This was an alert female, in no acute distress. LUNGS:  Clear to auscultation. CARDIAC:  Regular rate and rhythm. ABDOMEN:  Soft, nontender.  Good bowel sounds. NECK:  Hard cervical collar in place.  REHABILITATION HOSPITAL COURSE:  The patient was admitted to inpatient rehab services with therapies initiated on a 3-hour daily basis consisting of physical therapy, occupational therapy, and rehabilitation nursing.  The following issues were addressed during the patient's rehabilitation stay.  Pertaining to Ms. [name]'s cervical epidural abscess, she had undergone multilevel diskectomy with subsequent cord compression, requiring laminectomy, fusion on July 28, 2013.  She was followed by Neurosurgery, Dr. Vertell Limber.  Hard cervical collar in place. Recent followup films on August 25, 3498, showed no complicating features.  Decreased soft tissue swelling and her collar was discontinued 08/26/2013.  She initially was with sequential compression devices for DVT prophylaxis, as her recent surgery being completed.  Lovenox had been initiated.  Venous Doppler studies were negative.  Pain management with the use  of Neurontin 100 mg t.i.d. as well as Robaxin and oxycodone for breakthrough pain.  She completed her course of Rocephin on 08/26/2013, at the recommendations of Infectious Disease, remaining afebrile.  Her blood pressures were well controlled with lisinopril as  well as Toprol.  Neurogenic bowel and bladder, she was initially with Urecholine which was later discontinued as she was able to empty her bladder appropriately.  Urine study did show VRE, remaining afebrile, maintained on contact precautions.  Noted ongoing bouts of dysphonia with dysphagia.  A recent swallow study completed.  She was maintained on a dysphagia 3 nectar thick liquid diet which she tolerated well.  She would follow up outpatient with Dr. Benjamine Mola of ENT for any recommendations on possible left vocal cord paralysis. The patient received weekly collaborative interdisciplinary team conferences to discuss estimated length of stay, family teaching, and any barriers to discharge.  She continued to progress nicely, ambulating 180 feet with a rolling walker in a controlled environment.  Obstacle negotiation with cues for safety with rolling walker when stepping over obstacles.  She did need multiple rest breaks.  She was able to navigate 4-inch stairs with minimum-to-moderate assist activities of daily living.  The patient completed bathing with minimal assistance to wash feet.  She was able to maintain dynamic sitting balance.  She did make progressive gains, but 24-hour supervision was not able to be provided at home with skilled nursing facility, becoming available on August 26, 2013.  DISCHARGE MEDICATIONS: 1. Aspirin 81 mg p.o. daily. 2. Dulcolax suppository 10 mg rectally as needed. 3. Os-Cal 500 mg p.o. daily. 4. Vitamin D 2000 units p.o. daily. 5. Cyanocobalamin tablet 500 mcg p.o. daily. 6. Valium 2 mg every 8 hours as needed anxiety. 7. Flonase 50 mcg 2 sprays each nostril daily. 8. Neurontin 100 mg p.o. t.i.d. 9. Linzess 290 mcg p.o. daily. 10.Lisinopril 10 mg p.o. daily. 11.Claritin 10 mg p.o. daily. 12.Robaxin 500 mg p.o. every 6 hours as needed for muscle spasms. 13.Toprol-XL 50 mg p.o. daily. 14.Dulera 100/5 mcg inhaler two puffs twice daily. 15.Multivitamin 1  tablet p.o. daily. 16.Oxycodone 5/325, 1 or 2 tablets every 6 hours as needed for severe     pain. 17.MiraLax 17 g p.o. daily with 8 ounces of water. 18.Senokot-S tablets 2 p.o. b.i.d. 19.Desyrel 50 mg p.o. q.h.s.  DIET:  Mechanical soft nectar liquids.  SPECIAL INSTRUCTIONS:  Contact precautions as per policy for VRE in urine.  Lauraine Rinne, P.A.   ______________________________ [name], M.D.    DA/MEDQ  D:  08/26/2013  T:  08/26/2013  Job:  [id number]  cc:   Marchia Meiers. Vertell Limber, M.D. Leta Baptist, MD

## 2013-08-26 NOTE — Progress Notes (Signed)
Speech Language Pathology Discharge Summary & Final Treatment Note  Patient Details  Name: [name] MRN: [id number] Date of Birth: 1935-01-19  Today's Date: 08/26/2013 Time: 0835-0900 Time Calculation (min): 25 min  Skilled Therapeutic Interventions:  Skilled treatment focused on education related to swallowing and voice goals. SLP facilitated session with review of education and current level of function. Pt demonstrated her ability to thicken liquids with Mod I, and recalled three strategies to increase vocal hygiene with supervision level question cues.     Patient has met 2 of 2 long term goals.  Patient to discharge at overall Modified Independent level.  Reasons goals not met: N/A   Clinical Impression/Discharge Summary: Pt has met 2 out of 2 LTGs during this admission due to increased diet tolerance and knowledge related to vocal hygiene. Pt is Mod I with Dys 3 textures and nectar thick liquids with the water protocol between meals. Pt will benefit from continued SLP services at next level of care to continue to maximize swallowing safety and increase vocal hygiene.  Care Partner: N/A       Recommendation:  Skilled Nursing facility  Rationale for SLP Follow Up: Maximize functional communication;Maximize swallowing safety   Equipment:   N/A  Reasons for discharge: Treatment goals met;Discharged from hospital   Patient/Family Agrees with Progress Made and Goals Achieved: Yes   See FIM for current functional status   [name], M.A. CCC-SLP [phone]  [name] 08/26/2013, 11:26 AM

## 2013-08-26 NOTE — Progress Notes (Signed)
Patient discharged to [facility]. Patient discharged via family to rehab facility with patient belongings. Report called to Edward [place]field, Therapist, sports.

## 2013-08-26 NOTE — Discharge Summary (Signed)
  Discharge summary job [phone]

## 2013-08-26 NOTE — Progress Notes (Signed)
[facility] PHYSICAL MEDICINE & REHABILITATION     PROGRESS NOTE    Subjective/Complaints: No new problems. Up in bathroom when I came in. A 12 point review of systems has been performed and if not noted above is otherwise negative.   Objective: Vital Signs: Blood pressure 157/81, pulse 68, temperature 98 F (36.7 C), temperature source Oral, resp. rate 20, weight 113.399 kg (250 lb), SpO2 95.00%. Dg Cervical Spine 2 Or 3 Views  08/25/2013   CLINICAL DATA:  Postop.  EXAM: CERVICAL SPINE - 2-3 VIEW  COMPARISON:  DG C-ARM 1-60 MIN dated 07/28/2013; DG CERVICAL SPINE 2-3 VIEWS dated 07/27/2013  FINDINGS: Patient is status post anterior and posterior cervical fusion from C4-C7. No hardware complications. Straightening of the normal cervical lordosis. Decreased soft tissue prominence anterior to C3-4 when compared with 07/27/2013. Visualized lung apices are grossly clear.  IMPRESSION: Anterior and posterior C4-7 fusion without complicating feature. Decreased soft tissue swelling anterior to C3-4 when compared with 07/27/2013.   Electronically Signed   By: Lorin Picket M.D.   On: 08/25/2013 16:26   No results found for this basename: WBC, HGB, HCT, PLT,  in the last 72 hours No results found for this basename: NA, K, CL, CO, GLUCOSE, BUN, CREATININE, CALCIUM,  in the last 72 hours CBG (last 3)  No results found for this basename: GLUCAP,  in the last 72 hours  Wt Readings from Last 3 Encounters:  08/25/13 113.399 kg (250 lb)  07/30/13 111.131 kg (245 lb)  07/30/13 111.131 kg (245 lb)    Physical Exam:  Constitutional:  79 year old obese female, no distress. alert  HENT: oral mucosa pink and moist  Head: Normocephalic.  Eyes: EOM are normal.  Neck: Normal range of motion. Neck supple. No thyromegaly present.  Cardiovascular: Normal rate and regular rhythm. No murmurs, rubs, or gallops  Respiratory: Effort normal and breath sounds normal. No respiratory distress.  GI: Soft. Bowel sounds  are normal. She exhibits no distension.  Neurological:  patient is alert and a bit anxious. She is oriented x3 followed basic commands. RUE   3+ deltoid, bicep, triceps 3, WE 3+, HI  3+, LUE  3+ to 4- deltoid, bicep, tricep, wrist and HI. LE: RHF, KE and ankle 4/5. LLE 4-/5 proximal to distal. Decreased proprioception all 4's, lower more than upper, right more than left. DTR's 1+  Skin: ant neck CDI  Psych: pleasant, cooperative,   Assessment/Plan: 1. Functional deficits secondary to cervical epidural abscess with myelopathy s/p C4-7 laminectomy and fusion C3-T1, which require 3+ hours per day of interdisciplinary therapy in a comprehensive inpatient rehab setting. Physiatrist is providing close team supervision and 24 hour management of active medical problems listed below. Physiatrist and rehab team continue to assess barriers to discharge/monitor patient progress toward functional and medical goals.  SNF placement pending   FIM: FIM - Bathing Bathing Steps Patient Completed: Chest;Right Arm;Left Arm;Abdomen;Front perineal area;Buttocks;Right upper leg;Left upper leg Bathing: 4: Min-Patient completes 8-9 33f 10 parts or 75+ percent  FIM - Upper Body Dressing/Undressing Upper body dressing/undressing steps patient completed: Thread/unthread right bra strap;Thread/unthread left bra strap;Thread/unthread right sleeve of pullover shirt/dresss;Thread/unthread left sleeve of pullover shirt/dress;Put head through opening of pull over shirt/dress;Pull shirt over trunk Upper body dressing/undressing: 4: Min-Patient completed 75 plus % of tasks FIM - Lower Body Dressing/Undressing Lower body dressing/undressing steps patient completed: Thread/unthread right underwear leg;Thread/unthread left underwear leg;Pull underwear up/down;Thread/unthread right pants leg;Thread/unthread left pants leg;Pull pants up/down;Fasten/unfasten pants;Don/Doff right sock;Don/Doff left sock;Don/Doff right  shoe;Don/Doff left  shoe;Fasten/unfasten right shoe;Fasten/unfasten left shoe (using elastic laces, shoe horn, reacher, sock aid) Lower body dressing/undressing: 4: Steadying Assist  FIM - Toileting Toileting steps completed by patient: Adjust clothing prior to toileting;Performs perineal hygiene;Adjust clothing after toileting Toileting Assistive Devices: Grab bar or rail for support Toileting: 5: Supervision: Safety issues/verbal cues  FIM - Radio producer Devices: Insurance account manager Transfers: 4-To toilet/BSC: Min A (steadying Pt. > 75%);4-From toilet/BSC: Min A (steadying Pt. > 75%)  FIM - Bed/Chair Transfer Bed/Chair Transfer Assistive Devices: Arm rests;Bed rails;Walker Bed/Chair Transfer: 5: Supine > Sit: Supervision (verbal cues/safety issues);5: Chair or W/C > Bed: Supervision (verbal cues/safety issues);5: Bed > Chair or W/C: Supervision (verbal cues/safety issues)  FIM - Locomotion: Wheelchair Distance: 25' Locomotion: Wheelchair: 0: Activity did not occur FIM - Locomotion: Ambulation Locomotion: Ambulation Assistive Devices: Administrator Ambulation/Gait Assistance: 4: Min guard Locomotion: Ambulation: 4: Travels 150 ft or more with minimal assistance (Pt.>75%)  Comprehension Comprehension Mode: Auditory Comprehension: 6-Follows complex conversation/direction: With extra time/assistive device  Expression Expression Mode: Verbal Expression: 5-Expresses complex 90% of the time/cues < 10% of the time  Social Interaction Social Interaction: 6-Interacts appropriately with others with medication or extra time (anti-anxiety, antidepressant).  Problem Solving Problem Solving: 6-Solves complex problems: With extra time  Memory Memory: 6-More than reasonable amt of time  Medical Problem List and Plan:  1. Cervical epidural abscess with tetraplegia. Status post anterior cervical decompression discectomy multilevel C4-C7 07/16/2013 with subsequent cord compression  undergoing cervical 4-7 posterior cervical fusion, C4-C7 cervical laminectomy and C3-T1 levels 07/28/2013   -cervical xray stable, less swelling. NS will follow up regarding collar 2. DVT Prophylaxis/Anticoagulation: SCDs.   lovenox 30 q12. Dopplers negative 3. Pain Management: Neurontin 100 mg 3 times a day, Hydrocodone and Robaxin as needed. Monitor with increased mobility  4.ID/microaerophilic streptococci. Intravenous ceftriaxone through 08/26/2013  5. Neuropsych: This patient is capable of making decisions on her own behalf.  -low dose valium prn for anxiety (limit as much as possible) --discussed with patient 6. Hypertension. Lisinopril 5 mg daily---increased to 10mg , continue Lasix 20 mg daily.  7. Asthma. Continue Dulera puffs twice daily as well as Flonase/CPAP  8. Neurogenic bladder/bowel.    -VRE in urine--  -  Urecholine stopped without issue  -moving bowels 9. Obesity. History of gastric bypass  10. Insomnia. Trazodone 50 mg each bedtime 11. Dysphonia with dysphagia--left vocal cord paralysis on FEES.   -will schedule outpt ENT follow up. Not much that can be done at present  -removal of collar may help with swallowing compensation strategies  -appreciate SLP help     LOS (Days) 26 A FACE TO FACE EVALUATION WAS PERFORMED  Meredith Staggers 08/26/2013 8:49 AM

## 2013-08-26 NOTE — Progress Notes (Signed)
Physical Therapy Session Note  Patient Details  Name: [name] MRN: [id number] Date of Birth: [birth year]  Today's Date: 08/26/2013 Time: 1445-1530 Time Calculation (min): 45 min  Skilled Therapeutic Interventions/Progress Updates:   For last session, pt request to work on LE strengthening especially in RLE. Focused on gait training with RW for endurance and strengthening with S overall (intermittent cueing to slow down (RW tending to get ahead of pt)), standing therex with 2# ankle weights including hip abduction (difficult for pt), hamstring curls, and marches (10 reps each BLE), and Nustep on level 2 x 10 min. Pt overall S with transfers and mobility and excited about the next step in her rehab process.  Therapy Documentation Precautions:  Precautions Precautions: Fall Precaution Comments: Parasthesias Restrictions Weight Bearing Restrictions: No Other Position/Activity Restrictions: no lifting, pushing, or heaving lifting  Pain:  Denies pain.  See FIM for current functional status  Therapy/Group: Individual Therapy  Lars Masson 08/26/2013, 3:41 PM

## 2013-08-28 NOTE — Progress Notes (Signed)
Occupational Therapy Discharge Summary  Patient Details  Name: [name] MRN: [id number] Date of Birth: 06-16-1934  Today's Date: 08/28/2013  Patient has met 12 of 12 long term goals due to improved activity tolerance, improved balance, ability to compensate for deficits and functional use of  RIGHT upper extremity.  Patient to discharge at [facility] Assist level.  Patient's care partner unavailable to provide the necessary physical assistance at discharge.    Reasons goals not met: n/a  Recommendation:  Patient will benefit from ongoing skilled OT services in skilled nursing facility setting to continue to advance functional skills in the area of BADL.  Equipment: No equipment provided  Reasons for discharge: discharge from hospital  Patient/family agrees with progress made and goals achieved: Yes  OT Discharge Precautions/Restrictions  Precautions Precautions: Fall Precaution Comments: Parasthesias Restrictions Weight Bearing Restrictions: No  Pain Pain Assessment Pain Assessment: No/denies pain  ADL ADL ADL Comments: see FIM  Vision/Perception  Vision- History Patient Visual Report: No change from baseline Vision- Assessment Eye Alignment: Within Functional Limits Perception Perception: Within Functional Limits Praxis Praxis: Intact   Cognition Overall Cognitive Status: Within Functional Limits for tasks assessed Arousal/Alertness: Awake/alert Orientation Level: Oriented X4 Attention: Alternating Focused Attention: Appears intact Alternating Attention: Appears intact Memory: Appears intact Awareness: Appears intact Problem Solving: Appears intact Safety/Judgment: Appears intact  Sensation Sensation Light Touch: Impaired Detail Light Touch Impaired Details: Impaired RLE;Impaired RUE Stereognosis: Appears Intact Hot/Cold: Appears Intact Proprioception: Impaired Detail Proprioception Impaired Details: Impaired RLE Coordination Gross Motor  Movements are Fluid and Coordinated: Yes Fine Motor Movements are Fluid and Coordinated: No Coordination and Movement Description: mildly impaired at RUE, limited AROM of shoulder and impaired [employer]  Motor  Motor Motor: Other (comment) Motor - Skilled Clinical Observations: R sided weakness Motor - Discharge Observations: residual right-sided weakness  Mobility  Bed Mobility Bed Mobility: Rolling Left;Left Sidelying to Sit Rolling Right: 6: Modified independent (Device/Increase time) Supine to Sit: 6: Modified independent (Device/Increase time) Sitting - Scoot to Edge of Bed: 6: Modified independent (Device/Increase time) Transfers Transfers: Sit to Stand;Stand to Sit Sit to Stand: 4: Min guard Sit to Stand Details: Verbal cues for precautions/safety Stand to Sit: 4: Min guard Stand to Sit Details (indicate cue type and reason): Verbal cues for precautions/safety   Trunk/Postural Assessment  Lumbar Strength Overall Lumbar Strength Comments: posterior pelvic tilt Postural Control Righting Reactions: delayed   Balance Static Sitting Balance Static Sitting - Level of Assistance: 7: Independent Dynamic Sitting Balance Dynamic Sitting - Level of Assistance: 5: Stand by assistance Dynamic Standing Balance Dynamic Standing - Level of Assistance: 5: Stand by assistance  Extremity/Trunk Assessment RUE Assessment RUE Assessment: Exceptions to [facility] RUE Strength RUE Overall Strength: Deficits (3-/5) LUE Assessment LUE Assessment: Within Functional Limits LUE Strength LUE Overall Strength: Within Functional Limits for tasks assessed  See FIM for current functional status  Salome Spotted 08/28/2013, 4:27 PM

## 2013-08-30 NOTE — Progress Notes (Signed)
Social Work Patient ID: [name], female   DOB: 04-28-1935, 79 y.o.   MRN: [id number]  Late Entry:  CSW learned of a private room available for pt at [facility] on 08-26-13.  CSW spoke with pt and son about this.  Son transported pt via private vehicle in the evening.  Copied chart to accompany pt.  Pt was please to be going to the facility and getting closer to being home.

## 2013-08-30 NOTE — Progress Notes (Signed)
Social Work Discharge Note  The overall goal for the admission was met for:   Discharge location: No - pt decided to go to a SNF due to not having a consistent caregiver  Length of Stay: Yes - 26 days  Discharge activity level: Yes - min assist  Home/community participation: No - Pt tx to [facility] in [employer] provided included: MD, RD, PT, OT, SLP, RN, TR and Diplomatic Services operational officer: Private Insurance: [employer] Medicare Complete  Follow-up services arranged: Other: SNF  Comments (or additional information):  Patient/Family verbalized understanding of follow-up arrangements: Yes  Individual responsible for coordination of the follow-up plan: pt and SNF  Confirmed correct DME delivered: [name] 08/30/2013    [place]

## 2013-10-22 DIAGNOSIS — G6281 Critical illness polyneuropathy: Secondary | ICD-10-CM | POA: Insufficient documentation

## 2013-10-22 DIAGNOSIS — G629 Polyneuropathy, unspecified: Secondary | ICD-10-CM | POA: Insufficient documentation

## 2013-10-26 ENCOUNTER — Inpatient Hospital Stay: Payer: PRIVATE HEALTH INSURANCE | Admitting: Physical Medicine & Rehabilitation

## 2013-11-09 ENCOUNTER — Encounter: Payer: Self-pay | Admitting: Family Medicine

## 2013-11-10 ENCOUNTER — Encounter: Payer: Self-pay | Admitting: Family Medicine

## 2013-11-24 DIAGNOSIS — R490 Dysphonia: Secondary | ICD-10-CM | POA: Insufficient documentation

## 2013-12-03 ENCOUNTER — Encounter: Payer: Self-pay | Admitting: Physical Medicine & Rehabilitation

## 2013-12-03 ENCOUNTER — Encounter
Payer: PRIVATE HEALTH INSURANCE | Attending: Physical Medicine & Rehabilitation | Admitting: Physical Medicine & Rehabilitation

## 2013-12-03 VITALS — BP 186/79 | HR 85 | Resp 16 | Ht 63.0 in | Wt 244.0 lb

## 2013-12-03 DIAGNOSIS — M1990 Unspecified osteoarthritis, unspecified site: Secondary | ICD-10-CM | POA: Insufficient documentation

## 2013-12-03 DIAGNOSIS — R209 Unspecified disturbances of skin sensation: Secondary | ICD-10-CM | POA: Diagnosis not present

## 2013-12-03 DIAGNOSIS — Z981 Arthrodesis status: Secondary | ICD-10-CM | POA: Insufficient documentation

## 2013-12-03 DIAGNOSIS — G8250 Quadriplegia, unspecified: Secondary | ICD-10-CM | POA: Insufficient documentation

## 2013-12-03 DIAGNOSIS — Z9884 Bariatric surgery status: Secondary | ICD-10-CM | POA: Diagnosis not present

## 2013-12-03 DIAGNOSIS — R609 Edema, unspecified: Secondary | ICD-10-CM | POA: Diagnosis not present

## 2013-12-03 DIAGNOSIS — G2581 Restless legs syndrome: Secondary | ICD-10-CM | POA: Insufficient documentation

## 2013-12-03 DIAGNOSIS — S14104A Unspecified injury at C4 level of cervical spinal cord, initial encounter: Secondary | ICD-10-CM | POA: Insufficient documentation

## 2013-12-03 DIAGNOSIS — J449 Chronic obstructive pulmonary disease, unspecified: Secondary | ICD-10-CM | POA: Insufficient documentation

## 2013-12-03 DIAGNOSIS — S14104S Unspecified injury at C4 level of cervical spinal cord, sequela: Secondary | ICD-10-CM

## 2013-12-03 DIAGNOSIS — G061 Intraspinal abscess and granuloma: Secondary | ICD-10-CM

## 2013-12-03 DIAGNOSIS — J45909 Unspecified asthma, uncomplicated: Secondary | ICD-10-CM | POA: Insufficient documentation

## 2013-12-03 DIAGNOSIS — I10 Essential (primary) hypertension: Secondary | ICD-10-CM | POA: Insufficient documentation

## 2013-12-03 DIAGNOSIS — IMO0002 Reserved for concepts with insufficient information to code with codable children: Secondary | ICD-10-CM

## 2013-12-03 DIAGNOSIS — N3281 Overactive bladder: Secondary | ICD-10-CM | POA: Insufficient documentation

## 2013-12-03 DIAGNOSIS — Z79899 Other long term (current) drug therapy: Secondary | ICD-10-CM | POA: Diagnosis not present

## 2013-12-03 DIAGNOSIS — G4730 Sleep apnea, unspecified: Secondary | ICD-10-CM | POA: Insufficient documentation

## 2013-12-03 DIAGNOSIS — J4489 Other specified chronic obstructive pulmonary disease: Secondary | ICD-10-CM | POA: Insufficient documentation

## 2013-12-03 DIAGNOSIS — R7303 Prediabetes: Secondary | ICD-10-CM | POA: Insufficient documentation

## 2013-12-03 NOTE — Progress Notes (Signed)
Subjective:    Patient ID: [name], female    DOB: [birth year], 79 y.o.   MRN: [id number]  HPI  This is the first follow up visit for [name] after her cervical myelopathy/abscess/deompression and subsequent rehab stay. She is now in outpt therapies at [facility] and is receiving aquatic therapy as well. She is making nice progress!  She is walking with her straight cane but  is anxious to be without a device. She is still having sensory symptoms in her legs, however.   Her voice is improving, although she required a collagen injection into the vocal cord to help with her vocal quality.   Bladder and bowel are doing well.   For pain she's using lyrica 75mg  TID.  Her pain is primarily pins and needles. It is most severe in her feet/legs/finger tips. She will occasionally use a tramadol for breakthrough pain. She uses naproxen for pain each day. She uses ibuprofen at night 400mg  bid    Pain Inventory Average Pain 4 Pain Right Now 3 My pain is burning and tingling  In the last 24 hours, has pain interfered with the following? General activity 1 Relation with others 0 Enjoyment of life 1 What TIME of day is your pain at its worst? morning Sleep (in general) Good  Pain is worse with: standing Pain improves with: therapy/exercise Relief from Meds: 5  Mobility walk with assistance use a cane use a walker ability to climb steps?  yes do you drive?  yes Do you have any goals in this area?  yes  Function retired Do you have any goals in this area?  yes  Neuro/Psych weakness tingling spasms  Prior Studies Any changes since last visit?  yes x-rays CT/MRI  Physicians involved in your care Any changes since last visit?  no   History reviewed. No pertinent family history. History   Social History  . Marital Status: Single    Spouse Name: N/A    Number of Children: N/A  . Years of Education: N/A   Social History Main Topics  . Smoking status: Never  Smoker   . Smokeless tobacco: None  . Alcohol Use: No  . Drug Use: None  . Sexual Activity: None   Other Topics Concern  . None   Social History Narrative  . None   Past Surgical History  Procedure Laterality Date  . Gastric bypass    . Anterior cervical decomp/discectomy fusion N/A 07/16/2013    Procedure: ANTERIOR CERVICAL DECOMPRESSION/DISCECTOMY FUSION mutlipleLEVELS C4-7;  Surgeon: Erline Levine, MD;  Location: [place] [facility]UR[facility] ORS;  Service: Neurosurgery;  Laterality: N/A;  . Posterior cervical fusion/foraminotomy N/A 07/28/2013    Procedure: Cervical four-seven  posterior cervical fusion;  Surgeon: Erline Levine, MD;  Location: [place] NEURO ORS;  Service: Neurosurgery;  Laterality: N/A;   Past Medical History  Diagnosis Date  . Hypertension   . Asthma   . Cancer     Skin Cancer  . Sleep apnea   . COPD (chronic obstructive pulmonary disease)    BP 186/79  Pulse 85  Resp 16  Ht 5\' 3"  (1.6 m)  Wt 244 lb (110.678 kg)  BMI 43.23 kg/m2  SpO2 99%  Opioid Risk Score:   Fall Risk Score: High Fall Risk (>13 points) (pt educated on fall risk, brochure given to pt)   Review of Systems  Neurological: Positive for weakness.       Tingling, spasms  All other systems reviewed and are negative.  Objective:   Physical Exam  Constitutional:  79 year old obese female, no distress. alert  HENT: oral mucosa pink and moist  Head: Normocephalic.  Eyes: EOM are normal.  Neck: Normal range of motion. Neck supple. No thyromegaly present.  Cardiovascular: Normal rate and regular rhythm. No murmurs, rubs, or gallops  Respiratory: Effort normal and breath sounds normal. No respiratory distress.  GI: Soft. Bowel sounds are normal. She exhibits no distension.  Neurological:  patient is alert and a bit anxious. She is oriented x3 followed basic commands. RUE 1 deltoid, 4/5 bicep, triceps 4, WE 3+, HI 4, LUE 4+ to 5/5  deltoid, bicep, tricep, wrist and HI. LE: RHF, KE and ankle 5/5. LLE 4-/5  proximal to distal. Decreased proprioception all 4's, lower more than upper, right more than left--essentially a C4 level. DTR's 1+ . She walks with good stride length and width using her straight cane. She uses a more wide based approach without the cane. Skin: ant neck CDI  Psych: pleasant, cooperative,   Assessment/Plan:   1. Cervical epidural abscess with C4 tetraplegia. Status post anterior cervical decompression discectomy multilevel C4-C7 07/16/2013 with subsequent cord compression undergoing cervical 4-7 posterior cervical fusion, C4-C7 cervical laminectomy and C3-T1 levels 07/28/2013   -outpt PT as is. She is making great progress!!  -she likley will be able move from her cane at least for household distances soon.   -We discussed her sensory recovery which may take another 6-9 months before it reaches its max  -we reviewed the importance of good posture and positioning of her right shoulder to avoid injury to her RTC and adhesive capsulitis 3. Pain Management: lyrica 75mg . She probably could get by with a dose reduction. will reduce to one tab at dinner and bedtime and observe for effect  4. Edema control-  -recommend use of compression stockings at least for left leg  -also will reduce lyrica as above, limit NSAID's as able   Given that she has neurology follow up established locally, I will make her follow up PRN. She is welcome to call or return with any problems or questions

## 2013-12-03 NOTE — Patient Instructions (Signed)
LYRICA  COULD BE REDUCED TO 75MG  daily at dinner and bedtime if you wish  TRY A COMPRESSION STOCKING FOR YOUR LEGS TO HELP WITH EDEMA PARTICULARLY ON THE LEFT SIDE.

## 2013-12-11 ENCOUNTER — Encounter: Payer: Self-pay | Admitting: Family Medicine

## 2014-01-11 ENCOUNTER — Encounter: Payer: Self-pay | Admitting: Family Medicine

## 2014-02-10 ENCOUNTER — Encounter: Payer: Self-pay | Admitting: Family Medicine

## 2014-02-28 ENCOUNTER — Ambulatory Visit: Payer: Self-pay | Admitting: Family Medicine

## 2014-05-26 ENCOUNTER — Encounter (HOSPITAL_COMMUNITY): Payer: Self-pay | Admitting: Neurosurgery

## 2014-08-07 ENCOUNTER — Emergency Department: Payer: Self-pay | Admitting: Physician Assistant

## 2014-08-26 DIAGNOSIS — R202 Paresthesia of skin: Secondary | ICD-10-CM | POA: Insufficient documentation

## 2014-09-02 NOTE — Discharge Summary (Signed)
PATIENT NAME:  [name], BRUMMOND MR#:  [id number] DATE OF BIRTH:  [birth year]  DATE OF ADMISSION:  05/27/2012 DATE OF DISCHARGE:    ADMITTING DIAGNOSIS: Degenerative arthrosis of the right knee.   DISCHARGE DIAGNOSIS: Degenerative arthrosis of the right knee.   HISTORY: The patient is a 80 year old who has been followed at [facility] for progression of right knee pain. The patient had reported progression of right knee pain that was aggravated with weight-bearing activities. She had denied any gross locking of the knee but occasionally was noted to have some near giving way. She also reported some activity-related swelling of the knee. She had also reported some crepitus with range of motion of the knee. She had attempted to continue with exercise program such as aquatic program. She had not seen any significant improvement in her condition despite nonsteroidal antiinflammatory medications. The patient stated that the pain had progressed to the point that it was significantly interfering with her activities of daily living.   The patient had previously underwent a left total knee arthroplasty, in September 2012, and had done extremely well. X-rays taken of the right knee, in [facility], showed narrowing of the medial cartilage space with associated varus alignment. She was noted to have osteophyte as well as subchondral sclerosis. After discussion of the risks and benefits of surgical intervention, the patient expressed her understanding of the risks and benefits and agreed with plans for surgical intervention.   PROCEDURE: Right total knee arthroplasty using computer-assisted navigation.   ANESTHESIA: Femoral nerve block with spinal.   SOFT TISSUE RELEASE: Anterior cruciate ligament, posterior cruciate ligament, deep medial collateral ligament as well as the patellofemoral ligament.   IMPLANTS UTILIZED: DePuy PFC Sigma size 3 posterior stabilized femoral component (cemented),  size 3 MBT tibial component (cemented), 35 mm three pegged oval dome patella (cemented), and a 10 mm stabilized rotating platform polyethylene insert.   HOSPITAL COURSE: The patient tolerated the procedure very well. She had no complications. She was then taken to the PAC-U where she was stabilized and then transferred to the orthopedic floor. The patient began receiving anticoagulation therapy of Lovenox 30 mg every 12 hours per anesthesia and pharmacy protocol. She was fitted with TED stockings bilaterally. These were allowed to be removed one hour per eight hour shift. The right one was applied on day two following removal of the Hemovac and dressing change. The patient was also fitted with the  AV-I compression foot pumps set at 80 mmHg bilaterally. The patient has denied any calf pain. No evidence of any DVTs. Negative Homans sign. Neurovascular and neurosensory appeared to be intact and within normal limits, to the lower extremity. The patient's heels were elevated off the bed using rolled towels.   The patient has denied any chest pain or any shortness of breath. Vital signs have been stable. She has been afebrile. Hemodynamically she was stable and no transfusions were given other than the Autovac transfusions given the first 6 hours postoperatively. Occupational therapy was also initiated on day one for ADLs and assistive devices.   The patient's IV, Foley and Hemovac were discontinued on day two along with a dressing change. The wound was free of any drainage or signs of infection. The Polar Care was reapplied to the surgical leg maintaining a temperature of 40 to 50 degrees Fahrenheit.   The patient is being discharged to skilled nursing facility in improved stable condition. She may weight bear as tolerated. Elevate the heels off the bed on  pillows. She is to continue with TED stockings bilaterally. These are allowed to be removed one hour per eight hour shift. Incentive spirometer q. 1 hour  while awake. Encourage cough and deep breathing every two hours while awake. She is placed on a regular diet. She is to continue with Polar Care maintaining a temperature of 40 to 50 degrees Fahrenheit. Change the dressing as needed. She has a follow-up appointment in [facility] on January 30th at 8:15. She is not to take a shower until the staples are removed. She is to call the clinic if she has any temperatures of 101.5 or greater or excessive bleeding.   DRUG ALLERGIES: CODEINE, CONTRAST DYE, LEVAQUIN, SYNVISC, ADHESIVE TAPE.   MEDICATIONS: 1. Calcium carbonate chews 500 mg 2 times a day. 2. Celebrex 200 mg 2 times a day. 3. Vitamin D3 2000 units daily.  4. Vitamin B12 500 mcg daily.  5. Senokot-S 1 tablet 2 times a day.  6. Ferrous sulfate 142 mg daily. 7. Flonase nasal spray one spray to both nostrils at bedtime.  8. Lasix 20 mg daily. 9. Claritin 10 mg daily. 10. Metoprolol 50 mg q. a.m. 11. Metronidazole 0.75% cream applied to affected area, usual schedule, every day at 10:00 p.m. 12. Multivitamin 1 capsule daily. 13. Oxybutynin 2.5 mg twice a day.  14. Pantoprazole 40 mg 2 times a day. 15. Lovenox 40 mg q. 12 hours for 14 days and then discontinue and begin taking one 81 mg enteric-coated aspirin.  16. Advair Diskus 100/50 one puff inhalation 2 times a day. 17. Benadryl 50 mg at bedtime p.r.n.  18. Tylenol ES 500 to 1000 mg every 4 hours p.r.n. 19. Milk of Magnesia 30 mL every 6 hours p.r.n.  20. Mylanta DS 30 mL every 6 hours p.r.n.  21. Dulcolax suppository 10 mg rectally daily p.r.n. for constipation. 22. Refresh Celluvisc optic drops one drop to both eyes p.r.n.  23. Nystatin cream applied to affected area q. 12 hours p.r.n. for rash. 24. Oxycodone 5 to 10 mg every 4 to 6 hours p.r.n. for pain.  25. Mylanta Gas chewable 80 mg every 4 hours p.r.n.  26. Tramadol 50 to 100 mg every 4 to 6 hours p.r.n.  27. Enema soapsuds if no results with milk of magnesia or Dulcolax.    PAST MEDICAL HISTORY: 1. Hypertension.  2. Seasonal allergies. 3. Chronic obstructive pulmonary disease. 4. Cataracts. 5. Melanoma.           6. Sleep apnea for which she uses CPAP. 7. Obesity.  ____________________________ Vance Peper, PA [id number] D: 05/29/2012 08:03:03 ET T: 05/29/2012 08:24:16 ET JOB#: [id number]  cc: Vance Peper, PA, <Dictator> Elfreida Heggs PA ELECTRONICALLY SIGNED 06/01/2012 7:08

## 2014-09-02 NOTE — Op Note (Signed)
PATIENT NAME:  [name], [name] MR#:  [id number] DATE OF BIRTH:  01/21/1935  DATE OF PROCEDURE:  05/27/2012  PREOPERATIVE DIAGNOSIS: Degenerative arthrosis of the right knee.   POSTOPERATIVE DIAGNOSIS: Degenerative arthrosis of the right knee.   PROCEDURE PERFORMED: Right total knee arthroplasty using computer-assisted navigation.   SURGEON: Laurice Record. Holley Bouche., MD   ASSISTANT: Vance Peper, PA-C (required to maintain retraction throughout the procedure)   ANESTHESIA: Femoral nerve block and general.   ESTIMATED BLOOD LOSS: 50 mL.   FLUIDS REPLACED: 2000 mL of crystalloid.   TOURNIQUET TIME 83 minutes.   DRAINS: Two medium drains to reinfusion system.   SOFT TISSUE RELEASES: Anterior cruciate ligament, posterior cruciate ligament, deep medial collateral ligament, patellofemoral ligament.   IMPLANTS UTILIZED: DePuy PFC Sigma size 3 posterior stabilized femoral component (cemented), size 3 MBT tibial component (cemented), 35 mm three-peg oval dome patella (cemented), and a 10 mm stabilized rotating platform polyethylene insert.   INDICATIONS FOR SURGERY: The patient is a 80 year old female who has been seen for complaints of progressive right knee pain. X-rays demonstrated severe degenerative changes in tricompartmental fashion with relative varus deformity. She had previously undergone successful left total knee arthroplasty for degenerative arthritis. After discussion of the risks and benefits of surgical intervention, the patient expressed her understanding of the risks and benefits and agreed with plans for surgical intervention.   PROCEDURE IN DETAIL: The patient was brought into the operating room, and after adequate femoral nerve block and general anesthesia was achieved, a tourniquet was placed on the patient's upper right thigh. The patient's right knee and leg were cleaned and prepped with alcohol and DuraPrep and draped in the usual sterile fashion. A "timeout" was performed as  per usual protocol. The right lower extremity was exsanguinated using an Esmarch, and the tourniquet was inflated to 300 mmHg.  An anterior longitudinal incision was made, followed by a standard mid vastus approach. The deep fibers of the medial collateral ligament were elevated in a subperiosteal fashion off the medial flare of the tibia so as to maintain a continuous soft tissue sleeve. The patella was subluxed laterally, and the patellofemoral ligament was incised. Inspection of the knee demonstrated severe degenerative changes with evidence of eburnated bone to the medial compartment. Prominent osteophytes were debrided using a rongeur. Anterior and posterior cruciate ligaments were excised. Two 4.0 mm Schanz pins were inserted into the femur and into the tibia for attachment of the array of trackers used for computer-assisted navigation. Hip center was identified using circumduction technique. Distal landmarks were mapped using the computer. The distal femur and proximal tibia were mapped using the computer. The distal femoral cutting guide was positioned using computer-assisted navigation so as to achieve a 5-degree distal valgus cut. Cut was performed and verified using the computer. The distal femur was sized, and it was felt that a size 3 femoral component was appropriate. The size 3 cutting guide was positioned, and anterior cut was performed and verified using the computer. This was followed by completion of the posterior and chamfer cuts. The femoral cutting guide for the central box was then positioned, and a central box was performed.   Attention was directed to the proximal tibia. Medial and lateral menisci were excised. The extramedullary tibial cutting guide was positioned using computer-assisted navigation so as to achieve 0-degree varus-valgus alignment and 0-degree posterior slope. Cut was performed and verified using the computer. The proximal tibia was sized, and it was felt that a size 3  tibial  tray was appropriate. Tibial and femoral trials were inserted followed by insertion of a 10 mm polyethylene trial. Excellent medial and lateral soft tissue balancing was appreciated both in full extension and in flexion. Finally, the patella was cut and prepared so as to accommodate a 35 mm three-peg oval dome patella. The patellar trial was placed, and the knee was placed through a range of motion with excellent patellar tracking appreciated.   The femoral trial was removed. The central post hole for the tibial component was reamed, followed by insertion of a keel punch. The tibial tray was then removed. The cut surfaces of bone were irrigated with copious amounts of normal saline with antibiotic solution using pulsatile lavage and then suctioned dry. Polymethyl methacrylate cement with gentamicin was prepared in the usual fashion using a vacuum mixer. Cement was applied to the cut surface of the proximal tibia as well as along the undersurface of a size 3 MBT tibial component. The tibial component was positioned and impacted into place. Excess cement was removed using freer elevators. Cement was then applied to the cut surface of the femur as well as along the posterior flanges of a size 3 posterior stabilized femoral component. The femoral component was positioned and impacted into place. Excess cement was removed using freer elevators. A 10 mm polyethylene trial was inserted, and the knee was brought in full extension with steady axial compression applied. Finally, cement was applied to the backside of a 35 mm three-peg oval dome patella, and the patellar component was positioned and patellar clamp applied. Excess cement was removed using freer elevators.   After adequate curing of cement, the tourniquet was deflated after a total tourniquet time of 83 minutes. Hemostasis was achieved using electrocautery. The knee was irrigated with copious amounts of normal saline with antibiotic solution using  pulsatile lavage and then suctioned dry. The knee was inspected for any residual cement debris, and 30 mL of 0.25% Marcaine with epinephrine was injected along the posterior capsule. A 10 mm stabilized rotating platform polyethylene insert was inserted, and the knee was placed through a range of motion. Excellent medial and lateral soft tissue balancing was appreciated both in flexion and extension. Excellent patellar tracking was noted. Two medium drains were placed in the wound bed and brought out through a separate stab incision to be attached to a reinfusion system. The medial parapatellar portion of the incision was reapproximated using interrupted sutures of #1 Vicryl. The subcutaneous tissue was approximated in layers using first #0 Vicryl, followed 2-0 Vicryl. Skin was closed with skin staples. Sterile dressing was applied.   The patient tolerated the procedure well. She was transported to the recovery room in stable condition.   ____________________________ Laurice Record. Holley Bouche., MD jph:cb D: 05/27/2012 11:15:00 ET T: 05/27/2012 13:04:56 ET JOB#: [id number]  cc: Jeneen Rinks P. Holley Bouche., MD, <Dictator> JAMES P Holley Bouche MD ELECTRONICALLY SIGNED 05/29/2012 7:12

## 2014-09-03 NOTE — H&P (Signed)
PATIENT NAME:  [name], [name] MR#:  [id number] DATE OF BIRTH:  [birth year]  DATE OF ADMISSION:  07/13/2013  PRIMARY CARE PHYSICIAN:  Dion Body, MD  CHIEF COMPLAINT: Back pain since last night.   HISTORY OF PRESENT ILLNESS: A 80 year old Caucasian female with a history of hypertension, COPD, presented to the ED with back pain since last night which is in thoracic spinal area between the bilateral blades. The back pain is sharp, intermittent, 8 out of 10, exacerbated by movement. The patient denies any chest pain but has mild shortness of breath. The patient denies any fever or chills. No other symptoms.   PAST MEDICAL HISTORY: Hypertension, COPD, arthritis, lower back pain.  SOCIAL HISTORY: No smoking or drinking or illicit drugs.   PAST SURGICAL HISTORY: Bilateral knee replacement, eye surgery, tonsillectomy, cholecystectomy, hernia repair, appendectomy and gastric bypass.   FAMILY HISTORY:  Father died of CHF.  Mother died of breast cancer.   ALLERGIES: CODEINE, LEVAQUIN, SYNVISC, ADHESIVE.    HOME MEDICATIONS:   1.  Vitamin B12 250 mcg p.o. every other day.  2.  Tums E-X 750 mg p.o. 2 tablets once a day.  3.  Trazodone 50 mg p.o. at bedtime.  4.  Slow Fe 1 tab every 3 days.  5.  Oxybutynin 0.5 mg b.i.d.  6.  Multivitamin 2 tabs once a day.  7.  Lopressor 25 mg p.o. daily.  8.  Loratadine 10 mg p.o. daily.  9.  Lisinopril 5 mg p.o. daily.  10.  Lasix 20 mg p.o. once a day.  11.  IBU 200 mg p.o. 2 tablets once a day at bedtime.  11.  Flonase 50 mcg inhalation nasal spray 2 sprays twice a day.  12.  Cholecalciferol 2000 international units 1 tablet once a day.  13.  Benadryl 25 mg p.o. 2 tablets once a day at bedtime.  14.  Aspirin 81 mg p.o. daily.  15.  Aleve sodium 220 mg p.o. tablets 1 to 2 tablets once a day.  16.  Advair Diskus 250 mcg/50 mcg 1 puff b.i.d.  17.  Acetaminophen 650 mg 2 tablets once a day.  18.  Acetaminophen 500 mg 1 to 2 tablets every 4 hours  p.r.n.   REVIEW OF SYSTEMS: CONSTITUTIONAL: The patient denies any fever or chills, headache or dizziness. No weakness.  EYES: No double vision or blurry vision.  EAR, NOSE, THROAT: No postnasal drip, slurred speech or dysphagia.  CARDIOVASCULAR: No chest pain, palpitation, orthopnea or nocturnal dyspnea. No leg edema.  PULMONARY: No cough, sputum but has mild shortness of breath. No hemoptysis.  GASTROINTESTINAL: No abdominal pain, nausea, vomiting or diarrhea. No melena or bloody stool.  GENITOURINARY: No dysuria, hematuria or incontinence.  SKIN: No rash or jaundice.  NEUROLOGY: No syncope, loss of consciousness or seizure.  HEMATOLOGY: No easy bruising or bleeding.  ENDOCRINE: No polyuria, polydipsia, heat or cold intolerance.   PHYSICAL EXAMINATION: VITAL SIGNS: Temperature 98.5, blood pressure 175/88, pulse 71, O2 saturation 97% on room air.  GENERAL: The patient is alert, awake, oriented, in no acute distress.  HEENT: Pupils round, equal and reactive to light and accommodation.  NECK: Supple. No JVD or carotid bruit. No lymphadenopathy. No thyromegaly.  CARDIOVASCULAR: S1, S2. Regular rate and rhythm. No murmurs or gallops.  PULMONARY: Bilateral air entry. No wheezing or rales. No use of accessory muscle to breathe.  ABDOMEN: Soft. No distention or tenderness. No organomegaly. Bowel sounds present.  EXTREMITIES: No edema, clubbing or cyanosis. No calf  tenderness. Bilateral pedal pulses present.  MUSCULOSKELETAL: There is tenderness on thoracic spine. No erythema or swelling.  NEUROLOGIC: A and O x 3. No focal deficit. Power 5/5. Sensation intact.   LABORATORY, DIAGNOSTIC AND RADIOLOGICAL DATA:    1.  Chest x-ray showed mild cardiomegaly with pulmonary venous congestion but no frank pulmonary edema, nodular opacity on the left mid lung,  2.  WBC 12.8, hemoglobin 12.9, platelets 212.  3.  Glucose 150, BUN 50, creatinine 0.67, electrolytes are normal.  4.  BNP 129, d-dimer 0.58.   5.  EKG showed normal sinus rhythm at 69 BPM.   IMPRESSIONS: 1.  Back pain, need to rule out aortic dissection.  2.  Chronic obstructive pulmonary disease.  3.  Hypertension.  4.  History of arthritis.   PLAN OF TREATMENT:  1.  The patient was admitted for back pain, need to rule out aortic dissection or PE. Since the patient has ALLERGY TO IV DYE, the patient needs to get pretreatment protocol for chest CT angio dissection protocol. In addition, we will follow up troponin level, pain control. Hold aspirin.  2.  For COPD, we will give nebulizer treatment.  3.  Hypertension which is not controlled. We will continue the patient's home medication, give hydralazine IV p.r.n.  4.  I discussed the patient's condition and plan of treatment with the patient. The patient wants full code.   TIME SPENT: About 55 minutes. Also discussed with Dr. Reita Cliche, ED physician, and CT staff.     ____________________________ Demetrios Loll, MD qc:cs D: 07/13/2013 14:57:00 ET T: 07/13/2013 15:44:27 ET JOB#: [id number]  cc: Demetrios Loll, MD, <Dictator> Demetrios Loll MD ELECTRONICALLY SIGNED 07/13/2013 18:13

## 2014-09-03 NOTE — Discharge Summary (Signed)
PATIENT NAME:  [name], [name] MR#:  [id number] DATE OF BIRTH:  Oct 13, 1934  DATE OF ADMISSION:  07/13/2013 DATE OF DISCHARGE:  07/13/2013  PRIMARY CARE PHYSICIAN: Dr. Bluford Kaufmann.   REASON FOR ADMISSION: Back pain since last night.   DISCHARGE DIAGNOSES: Back pain, hypertension, chronic obstructive pulmonary disease.  CODE STATUS: Full code.   CONDITION: Stable.   HOME MEDICATIONS: Please refer to the [facility] physician discharge instruction medication reconciliation list.   DIET: Low-sodium, low-fat, low-cholesterol diet.   ACTIVITY: As tolerated.   FOLLOW-UP CARE: With Dr. Bluford Kaufmann within one week.   HOSPITAL COURSE:  1.  As mentioned in the H and P, the patient was admitted for back pain, rule out aortic dissection. The patient got a CT of the chest with contrast today, which did not show any aortic dissection. The patient back pain is better. The patient's troponin level has been negative. No acute coronary syndrome.  2.  Hypertension. Patient's blood pressure has been elevated. The highest blood pressure was 195/86. After treatment, patient's blood pressure decreased to 154/68 this afternoon. The patient is clinically stable and will be discharged to home today. I discussed the patient's and discharge plan with the patient and the patient's family member. Also discussed the patient's condition with Dr. Netty Starring. He agreed to discharge the patient to home, and suggested follow-up with him within one week.   TIME SPENT: About 36 minutes.  ____________________________ Demetrios Loll, MD qc:cg D: 07/13/2013 17:28:14 ET T: 07/14/2013 06:19:15 ET JOB#: [id number]  cc: Demetrios Loll, MD, <Dictator> Demetrios Loll MD ELECTRONICALLY SIGNED 07/14/2013 17:12

## 2015-01-26 ENCOUNTER — Other Ambulatory Visit: Payer: Self-pay | Admitting: Family Medicine

## 2015-01-26 DIAGNOSIS — Z1231 Encounter for screening mammogram for malignant neoplasm of breast: Secondary | ICD-10-CM

## 2015-03-02 ENCOUNTER — Ambulatory Visit: Payer: Self-pay | Attending: Family Medicine

## 2015-03-06 ENCOUNTER — Other Ambulatory Visit: Payer: Self-pay | Admitting: Family Medicine

## 2015-03-06 ENCOUNTER — Ambulatory Visit
Admission: RE | Admit: 2015-03-06 | Discharge: 2015-03-06 | Disposition: A | Discharge status: Home / Self Care | Payer: Medicare Other | Source: Ambulatory Visit | Attending: Family Medicine | Admitting: Family Medicine

## 2015-03-06 DIAGNOSIS — Z1239 Encounter for other screening for malignant neoplasm of breast: Secondary | ICD-10-CM

## 2015-03-06 DIAGNOSIS — Z1231 Encounter for screening mammogram for malignant neoplasm of breast: Secondary | ICD-10-CM

## 2015-03-26 DIAGNOSIS — Z96659 Presence of unspecified artificial knee joint: Secondary | ICD-10-CM | POA: Insufficient documentation

## 2015-06-29 ENCOUNTER — Encounter: Payer: Self-pay | Admitting: Emergency Medicine

## 2015-06-29 ENCOUNTER — Emergency Department
Admission: EM | Admit: 2015-06-29 | Discharge: 2015-06-29 | Disposition: A | Discharge status: Home / Self Care | Payer: Medicare Other | Attending: Emergency Medicine | Admitting: Emergency Medicine

## 2015-06-29 ENCOUNTER — Emergency Department: Payer: Medicare Other

## 2015-06-29 DIAGNOSIS — I10 Essential (primary) hypertension: Secondary | ICD-10-CM | POA: Diagnosis not present

## 2015-06-29 DIAGNOSIS — Z7951 Long term (current) use of inhaled steroids: Secondary | ICD-10-CM | POA: Insufficient documentation

## 2015-06-29 DIAGNOSIS — R109 Unspecified abdominal pain: Secondary | ICD-10-CM | POA: Diagnosis present

## 2015-06-29 DIAGNOSIS — R197 Diarrhea, unspecified: Secondary | ICD-10-CM

## 2015-06-29 DIAGNOSIS — Z79899 Other long term (current) drug therapy: Secondary | ICD-10-CM | POA: Insufficient documentation

## 2015-06-29 DIAGNOSIS — Z791 Long term (current) use of non-steroidal anti-inflammatories (NSAID): Secondary | ICD-10-CM | POA: Diagnosis not present

## 2015-06-29 DIAGNOSIS — N200 Calculus of kidney: Secondary | ICD-10-CM | POA: Insufficient documentation

## 2015-06-29 DIAGNOSIS — R1084 Generalized abdominal pain: Secondary | ICD-10-CM

## 2015-06-29 DIAGNOSIS — Z7982 Long term (current) use of aspirin: Secondary | ICD-10-CM | POA: Diagnosis not present

## 2015-06-29 LAB — GASTROINTESTINAL PANEL BY PCR, STOOL (REPLACES STOOL CULTURE)
Adenovirus F40/41: NOT DETECTED
Astrovirus: NOT DETECTED
CRYPTOSPORIDIUM: NOT DETECTED
CYCLOSPORA CAYETANENSIS: NOT DETECTED
Campylobacter species: DETECTED — AB
E. COLI O157: NOT DETECTED
ENTAMOEBA HISTOLYTICA: NOT DETECTED
ENTEROTOXIGENIC E COLI (ETEC): NOT DETECTED
Enteroaggregative E coli (EAEC): NOT DETECTED
Enteropathogenic E coli (EPEC): NOT DETECTED
Giardia lamblia: NOT DETECTED
NOROVIRUS GI/GII: DETECTED — AB
Plesimonas shigelloides: NOT DETECTED
Rotavirus A: NOT DETECTED
SALMONELLA SPECIES: NOT DETECTED
SAPOVIRUS (I, II, IV, AND V): NOT DETECTED
SHIGELLA/ENTEROINVASIVE E COLI (EIEC): NOT DETECTED
Shiga like toxin producing E coli (STEC): NOT DETECTED
VIBRIO CHOLERAE: NOT DETECTED
Vibrio species: NOT DETECTED
YERSINIA ENTEROCOLITICA: NOT DETECTED

## 2015-06-29 LAB — CBC
HCT: 38.8 % (ref 35.0–47.0)
Hemoglobin: 12.9 g/dL (ref 12.0–16.0)
MCH: 30 pg (ref 26.0–34.0)
MCHC: 33.4 g/dL (ref 32.0–36.0)
MCV: 90 fL (ref 80.0–100.0)
PLATELETS: 247 10*3/uL (ref 150–440)
RBC: 4.32 MIL/uL (ref 3.80–5.20)
RDW: 13.8 % (ref 11.5–14.5)
WBC: 13.6 10*3/uL — ABNORMAL HIGH (ref 3.6–11.0)

## 2015-06-29 LAB — URINALYSIS COMPLETE WITH MICROSCOPIC (ARMC ONLY)
BILIRUBIN URINE: NEGATIVE
Bacteria, UA: NONE SEEN
GLUCOSE, UA: NEGATIVE mg/dL
Hgb urine dipstick: NEGATIVE
KETONES UR: NEGATIVE mg/dL
Nitrite: NEGATIVE
Protein, ur: NEGATIVE mg/dL
Specific Gravity, Urine: 1.02 (ref 1.005–1.030)
pH: 5 (ref 5.0–8.0)

## 2015-06-29 LAB — COMPREHENSIVE METABOLIC PANEL
ALK PHOS: 57 U/L (ref 38–126)
ALT: 15 U/L (ref 14–54)
AST: 21 U/L (ref 15–41)
Albumin: 3.6 g/dL (ref 3.5–5.0)
Anion gap: 8 (ref 5–15)
BUN: 26 mg/dL — AB (ref 6–20)
CALCIUM: 8.7 mg/dL — AB (ref 8.9–10.3)
CHLORIDE: 104 mmol/L (ref 101–111)
CO2: 31 mmol/L (ref 22–32)
CREATININE: 0.93 mg/dL (ref 0.44–1.00)
GFR, EST NON AFRICAN AMERICAN: 57 mL/min — AB (ref >60)
Glucose, Bld: 113 mg/dL — ABNORMAL HIGH (ref 65–99)
Potassium: 3.7 mmol/L (ref 3.5–5.1)
Sodium: 143 mmol/L (ref 135–145)
Total Bilirubin: 0.5 mg/dL (ref 0.3–1.2)
Total Protein: 7 g/dL (ref 6.5–8.1)

## 2015-06-29 LAB — TROPONIN I: TROPONIN I: 0.04 ng/mL — AB (ref <0.031)

## 2015-06-29 LAB — C DIFFICILE QUICK SCREEN W PCR REFLEX
C DIFFICILE (CDIFF) INTERP: NEGATIVE
C DIFFICILE (CDIFF) TOXIN: NEGATIVE
C DIFFICLE (CDIFF) ANTIGEN: NEGATIVE

## 2015-06-29 LAB — LACTIC ACID, PLASMA: Lactic Acid, Venous: 1.6 mmol/L (ref 0.5–2.0)

## 2015-06-29 LAB — LIPASE, BLOOD: LIPASE: 60 U/L — AB (ref 11–51)

## 2015-06-29 MED ORDER — BARIUM SULFATE 2.1 % PO SUSP: 450.0000 mL | ORAL | Status: AC

## 2015-06-29 NOTE — Discharge Instructions (Signed)
It is unclear why you are having abdominal discomfort. We are sending cultures on your stool to see if there is something that requires antibiotics. If you have increased pain, vomiting, fever or you feel worse in any way including bleeding return to the emergency department. We do notice that you have a large kidney stone inside one of your kidneys. This is not causing your symptoms we do advise you follow up as an outpatient with urology at your convenience. If you have severe pain in your side or vomiting, return to the emergency room  Abdominal Pain, Adult Many things can cause belly (abdominal) pain. Most times, the belly pain is not dangerous. Many cases of belly pain can be watched and treated at home. HOME CARE   Do not take medicines that help you go poop (laxatives) unless told to by your doctor.  Only take medicine as told by your doctor.  Eat or drink as told by your doctor. Your doctor will tell you if you should be on a special diet. GET HELP IF:  You do not know what is causing your belly pain.  You have belly pain while you are sick to your stomach (nauseous) or have runny poop (diarrhea).  You have pain while you pee or poop.  Your belly pain wakes you up at night.  You have belly pain that gets worse or better when you eat.  You have belly pain that gets worse when you eat fatty foods.  You have a fever. GET HELP RIGHT AWAY IF:   The pain does not go away within 2 hours.  You keep throwing up (vomiting).  The pain changes and is only in the right or left part of the belly.  You have bloody or tarry looking poop. MAKE SURE YOU:   Understand these instructions.  Will watch your condition.  Will get help right away if you are not doing well or get worse.   This information is not intended to replace advice given to you by your health care provider. Make sure you discuss any questions you have with your health care provider.   Document Released: 10/16/2007  Document Revised: 05/20/2014 Document Reviewed: 01/06/2013 Elsevier Interactive Patient Education [employer].

## 2015-06-29 NOTE — ED Provider Notes (Addendum)
[facility] Emergency Department Provider Note  ____________________________________________   I have reviewed the triage vital signs and the nursing notes.   HISTORY  Chief Complaint Abdominal Pain    HPI [name] is a 81 y.o. female presents with abdominal 'discomfort' and and ache. she has a hx of gastric bypass in 2004 as well as chole and hysterectomy, no hx of afib.  Says that she had n/v/d 1.5 weeks to 2 weeks ago.  Since that time, her vomiting has gone, but she still has loose stools (consistency of toothpaste, she says).  No bleeding no melena no brbpr.  She says that she has had a periumbilical ache and an epigastric ache since her illness.  She has had no fever. No chills. No dysuria no cough no chest pain no headache.  May have had abx one month ago but cannot recall why. The pain is constant.  Better with food, worse without.  Has felt 'gassy'.   Past Medical History  Diagnosis Date  . Hypertension   . Asthma   . Cancer ([place])     Skin Cancer  . Sleep apnea   . COPD (chronic obstructive pulmonary disease) [facility])     Patient Active Problem List   Diagnosis Date Noted  . Asthma without status asthmaticus 12/03/2013  . Borderline diabetes 12/03/2013  . BP (high blood pressure) 12/03/2013  . Arthritis, degenerative 12/03/2013  . Adiposity 12/03/2013  . 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 Restless leg 12/03/2013  . Apnea, sleep 12/03/2013  . C4 spinal cord injury ([place]) 12/03/2013  . Dysphonia 11/24/2013  . Neuropathy ([place]) 10/22/2013  . Abscess in epidural space of cervical spine 07/30/2013  . Epidural abscess (embolic) of spinal cord [id number]  . Epidural abscess 07/22/2013  . Spinal epidural abscess 07/16/2013  . Acute respiratory failure ([place]) 07/16/2013  . OSA on CPAP 07/16/2013    Past Surgical History  Procedure Laterality Date  . Gastric bypass    . Anterior cervical decomp/discectomy fusion N/A 07/16/2013   Procedure: ANTERIOR CERVICAL DECOMPRESSION/DISCECTOMY FUSION mutlipleLEVELS C4-7;  Surgeon: Erline Levine, MD;  Location: [place] NEURO ORS;  Service: Neurosurgery;  Laterality: N/A;  . Posterior cervical fusion/foraminotomy N/A 07/28/2013    Procedure: Cervical four-seven  posterior cervical fusion;  Surgeon: Erline Levine, MD;  Location: [place] NEURO ORS;  Service: Neurosurgery;  Laterality: N/A;    Current Outpatient Rx  Name  Route  Sig  Dispense  Refill  . acetaminophen (TYLENOL) 650 MG CR tablet   Oral   Take 1,300 mg by mouth every morning.          Marland Kitchen aspirin 81 MG tablet   Oral   Take 81 mg by mouth every other day. Alternating with vitamin b-12         . calcium carbonate (TUMS EX) 750 MG chewable tablet   Oral   Chew 1 tablet by mouth daily.         . Cholecalciferol (VITAMIN D) 2000 UNITS CAPS   Oral   Take 2,000 Units by mouth daily.         . Ferrous Sulfate (SLOW RELEASE IRON PO)   Oral   Take 1 tablet by mouth daily as needed (for low iron).         . fluticasone (FLONASE) 50 MCG/ACT nasal spray   Each Nare   Place 1 spray into both nostrils 2 (two) times daily.          . Fluticasone-Salmeterol (ADVAIR)  250-50 MCG/DOSE AEPB   Inhalation   Inhale 1 puff into the lungs 2 (two) times daily.         . furosemide (LASIX) 20 MG tablet   Oral   Take 20 mg by mouth daily as needed for fluid.          . hydrochlorothiazide (MICROZIDE) 12.5 MG capsule               . ibuprofen (ADVIL,MOTRIN) 200 MG tablet   Oral   Take 400 mg by mouth at bedtime.          Marland Kitchen lisinopril (PRINIVIL,ZESTRIL) 5 MG tablet   Oral   Take 5 mg by mouth daily.          Marland Kitchen loratadine (CLARITIN) 10 MG tablet   Oral   Take 10 mg by mouth daily.         Marland Kitchen LYRICA 75 MG capsule               . metoprolol succinate (TOPROL-XL) 50 MG 24 hr tablet   Oral   Take 50 mg by mouth daily. Take with or immediately following a meal.         . naproxen sodium (ANAPROX) 220 MG  tablet   Oral   Take 220 mg by mouth daily.          Marland Kitchen oxybutynin (DITROPAN) 5 MG tablet   Oral   Take 2.5 mg by mouth 2 (two) times daily.          . Pediatric Multiple Vit-C-FA (PEDIATRIC MULTIVITAMIN) chewable tablet   Oral   Chew 1 tablet by mouth daily.         . traMADol (ULTRAM) 50 MG tablet               . traZODone (DESYREL) 50 MG tablet   Oral   Take 50 mg by mouth at bedtime.          . vitamin B-12 (CYANOCOBALAMIN) 250 MCG tablet   Oral   Take 250 mcg by mouth every other day. Alternating with aspirin           Allergies Codeine; Doxycycline; Iohexol; Other; Quinolones; and Synvisc  Family History  Problem Relation Age of Onset  . Breast cancer Mother 32  . Breast cancer Sister 74    Social History Social History  Substance Use Topics  . Smoking status: Never Smoker   . Smokeless tobacco: None  . Alcohol Use: No    Review of Systems Constitutional: No fever/chills Eyes: No visual changes. ENT: No sore throat. No stiff neck no neck pain Cardiovascular: Denies chest pain. Respiratory: Denies shortness of breath. Gastrointestinal:   See hpi. Genitourinary: Negative for dysuria. Musculoskeletal: Negative lower extremity swelling Skin: Negative for rash. Neurological: Negative for headaches, focal weakness or numbness. 10-point ROS otherwise negative.  ____________________________________________   PHYSICAL EXAM:  VITAL SIGNS: ED Triage Vitals  Enc Vitals Group     BP 06/29/15 1420 127/65 mmHg     Pulse Rate 06/29/15 1418 68     Resp 06/29/15 1418 18     Temp 06/29/15 1418 97.9 F (36.6 C)     Temp Source 06/29/15 1418 Oral     SpO2 06/29/15 1418 95 %     Weight 06/29/15 1418 236 lb (107.049 kg)     Height 06/29/15 1418 5\' 3"  (1.6 m)     Head Cir --      Peak Flow --  Pain Score 06/29/15 1419 7     Pain Loc --      Pain Edu? --      Excl. in [place]? --     Constitutional: Alert and oriented. Well appearing and in no  acute distress. Eyes: Conjunctivae are normal. PERRL. EOMI. Head: Atraumatic. Nose: No congestion/rhinnorhea. Mouth/Throat: Mucous membranes are moist.  Oropharynx non-erythematous. Neck: No stridor.   Nontender with no meningismus Cardiovascular: Normal rate, regular rhythm. Grossly normal heart sounds.  Good peripheral circulation. Respiratory: Normal respiratory effort.  No retractions. Lungs CTAB. Abdominal: morbidly obese + epigastric tenderness and periumbilical tenderness. No distention. No guarding no rebound non surgical abd. Back:  There is no focal tenderness or step off there is no midline tenderness there are no lesions noted. there is no CVA tenderness Musculoskeletal: No lower extremity tenderness. No joint effusions, no DVT signs strong distal pulses no edema Neurologic:  Normal speech and language. No gross focal neurologic deficits are appreciated.  Skin:  Skin is warm, dry and intact. No rash noted. Psychiatric: Mood and affect are normal. Speech and behavior are normal.  ____________________________________________   LABS (all labs ordered are listed, but only abnormal results are displayed)  Labs Reviewed  LIPASE, BLOOD - Abnormal; Notable for the following:    Lipase 60 (*)    All other components within normal limits  COMPREHENSIVE METABOLIC PANEL - Abnormal; Notable for the following:    Glucose, Bld 113 (*)    BUN 26 (*)    Calcium 8.7 (*)    GFR calc non Af Amer 57 (*)    All other components within normal limits  CBC - Abnormal; Notable for the following:    WBC 13.6 (*)    All other components within normal limits  URINALYSIS COMPLETEWITH MICROSCOPIC ([facility] ONLY)  LACTIC ACID, PLASMA  LACTIC ACID, PLASMA  TROPONIN I   ____________________________________________  EKG  I personally interpreted any EKGs ordered by me or triage Sinus rhythm rate 62 bpm no acute ST elevation or acute ST depression normal axis, PVC noted no other significant amount is  noted ____________________________________________  RADIOLOGY  I reviewed any imaging ordered by me or triage that were performed during my shift ____________________________________________   PROCEDURES  Procedure(s) performed: None  Critical Care performed: None  ____________________________________________   INITIAL IMPRESSION / ASSESSMENT AND PLAN / ED COURSE  Pertinent labs & imaging results that were available during my care of the patient were reviewed by me and considered in my medical decision making (see chart for details).  Pt has abd pain for over 10 days after a gi illness with residual loose stools. Hx of roux en y.  Has tenderness but not surgical abd. Given hx will obtain ct. Pt declines offered pain meds.  Knows she can have some if she wants.  ----------------------------------------- 6:56 PM on 06/29/2015 -----------------------------------------  Blood work and CT are very reassuring vital signs are reassuring. No evidence of acute intra-abdominal pathology. Patient made aware of the kidney stone finding and we'll have her follow up as needed with urology for that. I do not believe the patient has active ischemia but I did order a second troponin. The blood work here routinely for all patients shows a troponin of 0.3 and 0.49 of which are significant however given that finding we will obtain second cardiac enzyme. We will also send stool cultures. ____________________________________________   FINAL CLINICAL IMPRESSION(S) / ED DIAGNOSES  Final diagnoses:  None      This  chart was dictated using voice recognition software.  Despite best efforts to proofread,  errors can occur which can change meaning.     Schuyler Amor, MD 06/29/15 [place], MD 06/29/15 [phone]

## 2015-06-29 NOTE — ED Notes (Signed)
Pt states she had N,V,D for 2 weeks, now, all she is suffering from is painful gas and abd pain, states she had a bowl of soup for lunch today and it seem to ease the pain a little. Pt appears in no distress.

## 2015-07-03 ENCOUNTER — Telehealth: Payer: Self-pay | Admitting: Emergency Medicine

## 2015-07-03 NOTE — ED Notes (Signed)
Called pt to inform of gi panel results and no antibiotics are needed.  She has appt with pcp this afternoon and will inform them as well.

## 2015-07-09 DIAGNOSIS — I10 Essential (primary) hypertension: Secondary | ICD-10-CM | POA: Insufficient documentation

## 2015-07-09 DIAGNOSIS — J441 Chronic obstructive pulmonary disease with (acute) exacerbation: Secondary | ICD-10-CM | POA: Insufficient documentation

## 2015-07-09 DIAGNOSIS — Z7982 Long term (current) use of aspirin: Secondary | ICD-10-CM | POA: Diagnosis not present

## 2015-07-09 DIAGNOSIS — Z791 Long term (current) use of non-steroidal anti-inflammatories (NSAID): Secondary | ICD-10-CM | POA: Diagnosis not present

## 2015-07-09 DIAGNOSIS — R0602 Shortness of breath: Secondary | ICD-10-CM | POA: Diagnosis present

## 2015-07-09 DIAGNOSIS — Z7951 Long term (current) use of inhaled steroids: Secondary | ICD-10-CM | POA: Insufficient documentation

## 2015-07-09 DIAGNOSIS — Z79899 Other long term (current) drug therapy: Secondary | ICD-10-CM | POA: Diagnosis not present

## 2015-07-10 ENCOUNTER — Emergency Department
Admission: EM | Admit: 2015-07-10 | Discharge: 2015-07-10 | Disposition: A | Discharge status: Home / Self Care | Payer: Medicare Other | Attending: Emergency Medicine | Admitting: Emergency Medicine

## 2015-07-10 ENCOUNTER — Encounter: Payer: Self-pay | Admitting: Emergency Medicine

## 2015-07-10 ENCOUNTER — Emergency Department: Payer: Medicare Other

## 2015-07-10 DIAGNOSIS — J42 Unspecified chronic bronchitis: Secondary | ICD-10-CM

## 2015-07-10 LAB — COMPREHENSIVE METABOLIC PANEL
ALBUMIN: 3.3 g/dL — AB (ref 3.5–5.0)
ALK PHOS: 69 U/L (ref 38–126)
ALT: 40 U/L (ref 14–54)
AST: 32 U/L (ref 15–41)
Anion gap: 8 (ref 5–15)
BILIRUBIN TOTAL: 0.6 mg/dL (ref 0.3–1.2)
BUN: 16 mg/dL (ref 6–20)
CALCIUM: 8.6 mg/dL — AB (ref 8.9–10.3)
CO2: 33 mmol/L — ABNORMAL HIGH (ref 22–32)
Chloride: 99 mmol/L — ABNORMAL LOW (ref 101–111)
Creatinine, Ser: 0.66 mg/dL (ref 0.44–1.00)
GFR calc non Af Amer: >60 mL/min (ref >60)
Glucose, Bld: 115 mg/dL — ABNORMAL HIGH (ref 65–99)
POTASSIUM: 3.2 mmol/L — AB (ref 3.5–5.1)
SODIUM: 140 mmol/L (ref 135–145)
TOTAL PROTEIN: 6.8 g/dL (ref 6.5–8.1)

## 2015-07-10 LAB — CBC WITH DIFFERENTIAL/PLATELET
BASOS PCT: 0 %
Basophils Absolute: 0 10*3/uL (ref 0–0.1)
EOS ABS: 0.2 10*3/uL (ref 0–0.7)
Eosinophils Relative: 3 %
HCT: 37.8 % (ref 35.0–47.0)
HEMOGLOBIN: 12.8 g/dL (ref 12.0–16.0)
LYMPHS ABS: 3.2 10*3/uL (ref 1.0–3.6)
Lymphocytes Relative: 37 %
MCH: 30.6 pg (ref 26.0–34.0)
MCHC: 33.9 g/dL (ref 32.0–36.0)
MCV: 90.3 fL (ref 80.0–100.0)
MONO ABS: 0.6 10*3/uL (ref 0.2–0.9)
MONOS PCT: 6 %
Neutro Abs: 4.6 10*3/uL (ref 1.4–6.5)
Neutrophils Relative %: 54 %
Platelets: 231 10*3/uL (ref 150–440)
RBC: 4.19 MIL/uL (ref 3.80–5.20)
RDW: 13.8 % (ref 11.5–14.5)
WBC: 8.6 10*3/uL (ref 3.6–11.0)

## 2015-07-10 LAB — TROPONIN I: Troponin I: 0.03 ng/mL (ref <0.031)

## 2015-07-10 NOTE — ED Provider Notes (Signed)
[facility] Emergency Department Provider Note  ____________________________________________  Time seen: Approximately 1:05 AM  I have reviewed the triage vital signs and the nursing notes.   HISTORY  Chief Complaint Shortness of Breath    HPI [name] is a 81 y.o. female with a history of COPD, asthma, hypertension, and sleep apnea on CPAP at night who presents with shortness of breath.  She has been having these symptoms off and on for about 2 weeks, a relatively gradual onset, and has been seen by her primary care doctor and treated for acute bronchitis and COPD exacerbation.  She became anxious tonight when she was feeling short of breath and felt like her inhaler was not working and she acknowledges that getting anxious is an issue for her when she is having an exacerbation.  After coming to the emergency department she feels much more comfortable.  She rated her symptoms as moderate to severe earlier but now she is asymptomatic.  He denies chest pain, abdominal pain, nausea, vomiting.  She also denies fever and chills.  She has had a persistent issue with gastrointestinal upset but this is chronic for her.   Past Medical History  Diagnosis Date  . Hypertension   . Asthma   . Cancer ([place])     Skin Cancer  . Sleep apnea   . COPD (chronic obstructive pulmonary disease) [facility])     Patient Active Problem List   Diagnosis Date Noted  . Asthma without status asthmaticus 12/03/2013  . Borderline diabetes 12/03/2013  . BP (high blood pressure) 12/03/2013  . Arthritis, degenerative 12/03/2013  . Adiposity 12/03/2013  . 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 Restless leg 12/03/2013  . Apnea, sleep 12/03/2013  . C4 spinal cord injury ([place]) 12/03/2013  . Dysphonia 11/24/2013  . Neuropathy ([place]) 10/22/2013  . Abscess in epidural space of cervical spine 07/30/2013  . Epidural abscess (embolic) of spinal cord Q000111Q  . Epidural abscess 07/22/2013   . Spinal epidural abscess 07/16/2013  . Acute respiratory failure ([place]) 07/16/2013  . OSA on CPAP 07/16/2013    Past Surgical History  Procedure Laterality Date  . Gastric bypass    . Anterior cervical decomp/discectomy fusion N/A 07/16/2013    Procedure: ANTERIOR CERVICAL DECOMPRESSION/DISCECTOMY FUSION mutlipleLEVELS C4-7;  Surgeon: Erline Levine, MD;  Location: [place] NEURO ORS;  Service: Neurosurgery;  Laterality: N/A;  . Posterior cervical fusion/foraminotomy N/A 07/28/2013    Procedure: Cervical four-seven  posterior cervical fusion;  Surgeon: Erline Levine, MD;  Location: [place] NEURO ORS;  Service: Neurosurgery;  Laterality: N/A;    Current Outpatient Rx  Name  Route  Sig  Dispense  Refill  . acetaminophen (TYLENOL) 650 MG CR tablet   Oral   Take 1,300 mg by mouth every morning.          Marland Kitchen aspirin 81 MG tablet   Oral   Take 81 mg by mouth every other day. Alternating with vitamin b-12         . calcium carbonate (TUMS EX) 750 MG chewable tablet   Oral   Chew 1 tablet by mouth daily.         . Cholecalciferol (VITAMIN D) 2000 UNITS CAPS   Oral   Take 2,000 Units by mouth daily.         . Ferrous Sulfate (SLOW RELEASE IRON PO)   Oral   Take 1 tablet by mouth daily as needed (for low iron).         Marland Kitchen  fluticasone (FLONASE) 50 MCG/ACT nasal spray   Each Nare   Place 1 spray into both nostrils 2 (two) times daily.          . Fluticasone-Salmeterol (ADVAIR) 250-50 MCG/DOSE AEPB   Inhalation   Inhale 1 puff into the lungs 2 (two) times daily.         . furosemide (LASIX) 20 MG tablet   Oral   Take 20 mg by mouth daily as needed for fluid.          . hydrochlorothiazide (MICROZIDE) 12.5 MG capsule               . ibuprofen (ADVIL,MOTRIN) 200 MG tablet   Oral   Take 400 mg by mouth at bedtime.          Marland Kitchen lisinopril (PRINIVIL,ZESTRIL) 5 MG tablet   Oral   Take 5 mg by mouth daily.          Marland Kitchen loratadine (CLARITIN) 10 MG tablet   Oral   Take 10 mg  by mouth daily.         Marland Kitchen LYRICA 75 MG capsule               . metoprolol succinate (TOPROL-XL) 50 MG 24 hr tablet   Oral   Take 50 mg by mouth daily. Take with or immediately following a meal.         . naproxen sodium (ANAPROX) 220 MG tablet   Oral   Take 220 mg by mouth daily.          Marland Kitchen oxybutynin (DITROPAN) 5 MG tablet   Oral   Take 2.5 mg by mouth 2 (two) times daily.          . Pediatric Multiple Vit-C-FA (PEDIATRIC MULTIVITAMIN) chewable tablet   Oral   Chew 1 tablet by mouth daily.         . traMADol (ULTRAM) 50 MG tablet               . traZODone (DESYREL) 50 MG tablet   Oral   Take 50 mg by mouth at bedtime.          . vitamin B-12 (CYANOCOBALAMIN) 250 MCG tablet   Oral   Take 250 mcg by mouth every other day. Alternating with aspirin           Allergies Codeine; Doxycycline; Iohexol; Other; Quinolones; and Synvisc  Family History  Problem Relation Age of Onset  . Breast cancer Mother 67  . Breast cancer Sister 91    Social History Social History  Substance Use Topics  . Smoking status: Never Smoker   . Smokeless tobacco: None  . Alcohol Use: No    Review of Systems Constitutional: No fever/chills Eyes: No visual changes. ENT: No sore throat. Cardiovascular: Denies chest pain. Respiratory: Shortness of breath and cough Gastrointestinal: No abdominal pain.  No nausea, no vomiting.  No diarrhea.  No constipation. Genitourinary: Negative for dysuria. Musculoskeletal: Negative for back pain. Skin: Negative for rash. Neurological: Negative for headaches, focal weakness or numbness.  10-point ROS otherwise negative.  ____________________________________________   PHYSICAL EXAM:  VITAL SIGNS: ED Triage Vitals  Enc Vitals Group     BP 07/10/15 0004 155/95 mmHg     Pulse Rate 07/10/15 0004 64     Resp 07/10/15 0004 20     Temp 07/10/15 0004 98 F (36.7 C)     Temp Source 07/10/15 0004 Oral     SpO2 07/10/15 0004  94 %      Weight 07/10/15 0004 235 lb (106.595 kg)     Height 07/10/15 0004 5\' 3"  (1.6 m)     Head Cir --      Peak Flow --      Pain Score 07/10/15 0005 8     Pain Loc --      Pain Edu? --      Excl. in [place]? --     Constitutional: Alert and oriented. Well appearing and in no acute distress. Eyes: Conjunctivae are normal. PERRL. EOMI. Head: Atraumatic. Nose: No congestion/rhinnorhea. Mouth/Throat: Mucous membranes are moist.  Oropharynx non-erythematous. Neck: No stridor.   Cardiovascular: Normal rate, regular rhythm. Grossly normal heart sounds.  Good peripheral circulation. Respiratory: Normal respiratory effort.  No retractions. Lungs CTAB  Gastrointestinal: Soft and nontender. No distention. No abdominal bruits. No CVA tenderness. Musculoskeletal: No lower extremity tenderness nor edema.  No joint effusions. Neurologic:  Normal speech and language. No gross focal neurologic deficits are appreciated.  Skin:  Skin is warm, dry and intact. No rash noted.   ____________________________________________   LABS (all labs ordered are listed, but only abnormal results are displayed)  Labs Reviewed  COMPREHENSIVE METABOLIC PANEL - Abnormal; Notable for the following:    Potassium 3.2 (*)    Chloride 99 (*)    CO2 33 (*)    Glucose, Bld 115 (*)    Calcium 8.6 (*)    Albumin 3.3 (*)    All other components within normal limits  CBC WITH DIFFERENTIAL/PLATELET  TROPONIN I   ____________________________________________  EKG  ED ECG REPORT I, Jeevan Kalla, the attending physician, personally viewed and interpreted this ECG.   Date: 07/10/2015  EKG Time: 00:07  Rate: 62  Rhythm: normal sinus rhythm  Axis: Left axis deviation  Intervals: Normal  ST&T Change: Non-specific ST segment / T-wave changes, but no evidence of acute ischemia.   ____________________________________________  RADIOLOGY   Dg Chest 2 View  07/10/2015  CLINICAL DATA:  81 year old female with shortness of  breath EXAM: CHEST  2 VIEW COMPARISON:  Chest radiograph dated 07/15/2013 and CT dated 07/15/2013 FINDINGS: Two views of the chest do not demonstrate a focal consolidation. There is no pleural effusion or pneumothorax. Linear and platelike atelectasis noted at the left lung base. Top-normal cardiac silhouette. There are degenerative changes of the spine. Cervical spine fixation hardware noted. No acute osseous pathology. IMPRESSION: No active cardiopulmonary disease. Electronically Signed   By: Anner Crete M.D.   On: 07/10/2015 01:52    ____________________________________________   PROCEDURES  Procedure(s) performed: None  Critical Care performed: No ____________________________________________   INITIAL IMPRESSION / ASSESSMENT AND PLAN / ED COURSE  Pertinent labs & imaging results that were available during my care of the patient were reviewed by me and considered in my medical decision making (see chart for details).  The patient feels much better after coming to the emergency department.  I offered her a nebulizer treatment but she does not want it at this time.  I reassured her that her workup is unremarkable.  I also offered a repeat troponin but she has not had any chest pain and her troponin was 0.03 and she does not want a repeat at this time and I also do not feel it is necessary.  Her vital signs are normal and she is afebrile and has a reassuring oxygen saturation.  I gave my usual and customary return precautions.  She already has medications at home for  her COPD and does not need additional prescriptions.  ____________________________________________  FINAL CLINICAL IMPRESSION(S) / ED DIAGNOSES  Final diagnoses:  Chronic bronchitis, unspecified chronic bronchitis type ([place])      NEW MEDICATIONS STARTED DURING THIS VISIT:  New Prescriptions   No medications on file      Note:  This document was prepared using Dragon voice recognition software and may include  unintentional dictation errors.   Hinda Kehr, MD 07/10/15 0500

## 2015-07-10 NOTE — ED Notes (Signed)
Patient with no complaints at this time. Respirations even and unlabored. Skin warm/dry. Discharge instructions reviewed with patient at this time. Patient given opportunity to voice concerns/ask questions. Patient discharged at this time and left Emergency Department with steady gait.   

## 2015-07-10 NOTE — Discharge Instructions (Signed)
We believe that your symptoms are caused today by an exacerbation of your COPD, and possibly bronchitis.  Please take the prescribed medications and any medications that you have at home for your COPD.  Follow up with your doctor as recommended.  If you develop any new or worsening symptoms, including but not limited to fever, persistent vomiting, worsening shortness of breath, or other symptoms that concern you, please return to the Emergency Department immediately. ° ° °Chronic Obstructive Pulmonary Disease °Chronic obstructive pulmonary disease (COPD) is a common lung condition in which airflow from the lungs is limited. COPD is a general term that can be used to describe many different lung problems that limit airflow, including both chronic bronchitis and emphysema.  If you have COPD, your lung function will probably never return to normal, but there are measures you can take to improve lung function and make yourself feel better.  °CAUSES  °· Smoking (common).   °· Exposure to secondhand smoke.   °· Genetic problems. °· Chronic inflammatory lung diseases or recurrent infections. °SYMPTOMS  °· Shortness of breath, especially with physical activity.   °· Deep, persistent (chronic) cough with a large amount of thick mucus.   °· Wheezing.   °· Rapid breaths (tachypnea).   °· Gray or bluish discoloration (cyanosis) of the skin, especially in fingers, toes, or lips.   °· Fatigue.   °· Weight loss.   °· Frequent infections or episodes when breathing symptoms become much worse (exacerbations).   °· Chest tightness. °DIAGNOSIS  °Your health care provider will take a medical history and perform a physical examination to make the initial diagnosis.  Additional tests for COPD may include:  °· Lung (pulmonary) function tests. °· Chest X-ray. °· CT scan. °· Blood tests. °TREATMENT  °Treatment available to help you feel better when you have COPD includes:  °· Inhaler and nebulizer medicines. These help manage the symptoms of  COPD and make your breathing more comfortable. °· Supplemental oxygen. Supplemental oxygen is only helpful if you have a low oxygen level in your blood.   °· Exercise and physical activity. These are beneficial for nearly all people with COPD. Some people may also benefit from a pulmonary rehabilitation program. °HOME CARE INSTRUCTIONS  °· Take all medicines (inhaled or pills) as directed by your health care provider. °· Avoid over-the-counter medicines or cough syrups that dry up your airway (such as antihistamines) and slow down the elimination of secretions unless instructed otherwise by your health care provider.   °· If you are a smoker, the most important thing that you can do is stop smoking. Continuing to smoke will cause further lung damage and breathing trouble. Ask your health care provider for help with quitting smoking. He or she can direct you to community resources or hospitals that provide support. °· Avoid exposure to irritants such as smoke, chemicals, and fumes that aggravate your breathing. °· Use oxygen therapy and pulmonary rehabilitation if directed by your health care provider. If you require home oxygen therapy, ask your health care provider whether you should purchase a pulse oximeter to measure your oxygen level at home.   °· Avoid contact with individuals who have a contagious illness. °· Avoid extreme temperature and humidity changes. °· Eat healthy foods. Eating smaller, more frequent meals and resting before meals may help you maintain your strength. °· Stay active, but balance activity with periods of rest. Exercise and physical activity will help you maintain your ability to do things you want to do. °· Preventing infection and hospitalization is very important when you have COPD. Make sure   to receive all the vaccines your health care provider recommends, especially the pneumococcal and influenza vaccines. Ask your health care provider whether you need a pneumonia vaccine. °· Learn  and use relaxation techniques to manage stress. °· Learn and use controlled breathing techniques as directed by your health care provider. Controlled breathing techniques include:   °¨ Pursed lip breathing. Start by breathing in (inhaling) through your nose for 1 second. Then, purse your lips as if you were going to whistle and breathe out (exhale) through the pursed lips for 2 seconds.   °¨ Diaphragmatic breathing. Start by putting one hand on your abdomen just above your waist. Inhale slowly through your nose. The hand on your abdomen should move out. Then purse your lips and exhale slowly. You should be able to feel the hand on your abdomen moving in as you exhale.   °· Learn and use controlled coughing to clear mucus from your lungs. Controlled coughing is a series of short, progressive coughs. The steps of controlled coughing are:   °1. Lean your head slightly forward.   °2. Breathe in deeply using diaphragmatic breathing.   °3. Try to hold your breath for 3 seconds.   °4. Keep your mouth slightly open while coughing twice.   °5. Spit any mucus out into a tissue.   °6. Rest and repeat the steps once or twice as needed. °SEEK MEDICAL CARE IF:  °· You are coughing up more mucus than usual.   °· There is a change in the color or thickness of your mucus.   °· Your breathing is more labored than usual.   °· Your breathing is faster than usual.   °SEEK IMMEDIATE MEDICAL CARE IF:  °· You have shortness of breath while you are resting.   °· You have shortness of breath that prevents you from: °¨ Being able to talk.   °¨ Performing your usual physical activities.   °· You have chest pain lasting longer than 5 minutes.   °· Your skin color is more cyanotic than usual. °· You measure low oxygen saturations for longer than 5 minutes with a pulse oximeter. °MAKE SURE YOU:  °· Understand these instructions. °· Will watch your condition. °· Will get help right away if you are not doing well or get worse. °Document Released:  02/06/2005 Document Revised: 09/13/2013 Document Reviewed: 12/24/2012 °Exit[employer]® Patient Information ©2015 [employer]. This information is not intended to replace advice given to you by your health care provider. Make sure you discuss any questions you have with your health care provider. ° ° ° °

## 2015-07-10 NOTE — ED Notes (Signed)
Pt states that she has been sick off and on for the past couple weeks starting with the gi virus, pt states that she saw dr Sabra Heck this week and was diagnosed with an infection in her vocal cords, pt states that she has copd and is coughing and congested now and states that she was having diff breathing and used her albuterol inhaler that helped bit thinks she needs a nebulizer treatment because she cont to have some diff breathing

## 2015-07-11 DIAGNOSIS — J3801 Paralysis of vocal cords and larynx, unilateral: Secondary | ICD-10-CM | POA: Insufficient documentation

## 2015-07-11 DIAGNOSIS — H9313 Tinnitus, bilateral: Secondary | ICD-10-CM | POA: Insufficient documentation

## 2015-07-11 DIAGNOSIS — R490 Dysphonia: Secondary | ICD-10-CM | POA: Insufficient documentation

## 2016-01-03 ENCOUNTER — Other Ambulatory Visit: Payer: Self-pay | Admitting: Family Medicine

## 2016-01-03 DIAGNOSIS — Z1231 Encounter for screening mammogram for malignant neoplasm of breast: Secondary | ICD-10-CM

## 2016-03-07 ENCOUNTER — Ambulatory Visit: Payer: Medicare Other | Attending: Family Medicine

## 2016-04-23 ENCOUNTER — Ambulatory Visit: Payer: Medicare Other | Attending: Family Medicine

## 2016-05-02 ENCOUNTER — Ambulatory Visit
Admission: RE | Admit: 2016-05-02 | Discharge: 2016-05-02 | Disposition: A | Discharge status: Home / Self Care | Payer: Medicare Other | Source: Ambulatory Visit | Attending: Family Medicine | Admitting: Family Medicine

## 2016-05-02 DIAGNOSIS — Z1231 Encounter for screening mammogram for malignant neoplasm of breast: Secondary | ICD-10-CM | POA: Insufficient documentation

## 2016-12-17 ENCOUNTER — Ambulatory Visit (INDEPENDENT_AMBULATORY_CARE_PROVIDER_SITE_OTHER): Payer: Medicare Other | Admitting: Family Medicine

## 2016-12-17 ENCOUNTER — Encounter: Payer: Self-pay | Admitting: Family Medicine

## 2016-12-17 VITALS — BP 124/68 | HR 89 | Temp 97.9°F | Ht 63.0 in | Wt 238.0 lb

## 2016-12-17 DIAGNOSIS — R490 Dysphonia: Secondary | ICD-10-CM | POA: Diagnosis not present

## 2016-12-17 DIAGNOSIS — G629 Polyneuropathy, unspecified: Secondary | ICD-10-CM | POA: Diagnosis not present

## 2016-12-17 DIAGNOSIS — G4733 Obstructive sleep apnea (adult) (pediatric): Secondary | ICD-10-CM

## 2016-12-17 DIAGNOSIS — S14104S Unspecified injury at C4 level of cervical spinal cord, sequela: Secondary | ICD-10-CM | POA: Diagnosis not present

## 2016-12-17 DIAGNOSIS — I10 Essential (primary) hypertension: Secondary | ICD-10-CM | POA: Diagnosis not present

## 2016-12-17 DIAGNOSIS — Z9989 Dependence on other enabling machines and devices: Secondary | ICD-10-CM

## 2016-12-17 DIAGNOSIS — G4700 Insomnia, unspecified: Secondary | ICD-10-CM | POA: Diagnosis not present

## 2016-12-17 DIAGNOSIS — J45909 Unspecified asthma, uncomplicated: Secondary | ICD-10-CM | POA: Diagnosis not present

## 2016-12-17 NOTE — Progress Notes (Signed)
82 y/o new to clinic with the following PMH:  . Sleep apnea on CPAP.  Marland Kitchen Essential hypertension  . Asthma without status asthmaticus, unspecified  . RLS (restless legs syndrome)  . OA (osteoarthritis)  . Overactive bladder  . Spinal cord abscess (CMS-HCC)  . Polyneuropathy associated with critical illness (CMS-HCC)  . Dysphonia  . Paresthesia  . Status post total left knee replacement  . Status post total right knee replacement    Other docs:  Dr. Thomasene Ripple (Opthalmology) Dr. Kellie Moor (Dermatology) Dr. Marry Guan (orthopedics) Dr. Manuella Ghazi (neurology)  Has a Living will; POA- Tessie Fass Masullo (son)  ================================================  Wanted to get set up with pulmonary re: OSA.  Would prefer [place].   D/w pt.    She had prev vocal cord injection, wanted recheck.  D/w pt. Her issues started after spinal cord abscess with prev vocal cord injection per [facility].  D/w pt.  Voice is at baseline.    Asthma.  She tried using advair but couldn't tolerate with worsening dysphonia.  She clearly did worse at elevation and walking in the heat while on a trip out [place] recently, improved since coming back to [facility].   H/p spinal cord abscess s/p surgery with prev weakness in ext x4, learned to walk again, etd.  Still with paresthesia from the neck down.  Normal sensation on the face.  Neuropathy improved with lyrica use.  Had swelling on gabapentin.  Needed handicap parking sticker, done at [place].   Insomnia.  Better with trazodone.  No ADE on med.  Compliant.    Hypertension:    Using medication without problems or lightheadedness: yes Chest pain with exertion:no Edema: usually some, controlled with meds.  Short of breath:not now, see above, able to tolerate silver sneakers.    Tetanus vaccine given on 07/20/10; PNA-23 and Zostavax vaccines administered on 04/26/09; PCV-13 vaccine given on 05/28/13 (01/02/16)   PMH and SH reviewed  ROS: Per HPI unless specifically indicated in  ROS section   Meds, vitals, and allergies reviewed.   GEN: nad, alert and oriented HEENT: mucous membranes moist, R pupil defect noted.   NECK: supple w/o LA CV: rrr, murmur noted.  PULM: ctab, no inc wob ABD: soft, +bs EXT: no edema SKIN: no acute rash Dec sensation from the neck down, normal on the face.

## 2016-12-17 NOTE — Patient Instructions (Addendum)
We'll will call about your referral. Take care.  Glad to see you.  Go to the lab on the way out.  We'll contact you with your lab report. Don't change your meds for now

## 2016-12-18 LAB — COMPREHENSIVE METABOLIC PANEL
ALBUMIN: 3.9 g/dL (ref 3.5–5.2)
ALK PHOS: 60 U/L (ref 39–117)
ALT: 9 U/L (ref 0–35)
AST: 15 U/L (ref 0–37)
BILIRUBIN TOTAL: 0.3 mg/dL (ref 0.2–1.2)
BUN: 33 mg/dL — ABNORMAL HIGH (ref 6–23)
CALCIUM: 9.1 mg/dL (ref 8.4–10.5)
CHLORIDE: 104 meq/L (ref 96–112)
CO2: 35 mEq/L — ABNORMAL HIGH (ref 19–32)
CREATININE: 1.2 mg/dL (ref 0.40–1.20)
GFR: 45.94 mL/min — ABNORMAL LOW (ref >60.00)
Glucose, Bld: 79 mg/dL (ref 70–99)
Potassium: 4.3 mEq/L (ref 3.5–5.1)
Sodium: 145 mEq/L (ref 135–145)
TOTAL PROTEIN: 7 g/dL (ref 6.0–8.3)

## 2016-12-18 LAB — CBC WITH DIFFERENTIAL/PLATELET
Basophils Absolute: 0 10*3/uL (ref 0.0–0.1)
Basophils Relative: 0.5 % (ref 0.0–3.0)
Eosinophils Absolute: 0.4 10*3/uL (ref 0.0–0.7)
Eosinophils Relative: 4 % (ref 0.0–5.0)
HCT: 39.2 % (ref 36.0–46.0)
Hemoglobin: 12.9 g/dL (ref 12.0–15.0)
Lymphocytes Relative: 21.8 % (ref 12.0–46.0)
Lymphs Abs: 2 10*3/uL (ref 0.7–4.0)
MCHC: 32.8 g/dL (ref 30.0–36.0)
MCV: 96.4 fl (ref 78.0–100.0)
Monocytes Absolute: 0.6 10*3/uL (ref 0.1–1.0)
Monocytes Relative: 7.1 % (ref 3.0–12.0)
Neutro Abs: 6 10*3/uL (ref 1.4–7.7)
Neutrophils Relative %: 66.6 % (ref 43.0–77.0)
Platelets: 213 10*3/uL (ref 150.0–400.0)
RBC: 4.07 Mil/uL (ref 3.87–5.11)
RDW: 13.9 % (ref 11.5–15.5)
WBC: 9.1 10*3/uL (ref 4.0–10.5)

## 2016-12-18 LAB — TSH: TSH: 1.99 u[IU]/mL (ref 0.35–4.50)

## 2016-12-19 ENCOUNTER — Encounter: Payer: Self-pay | Admitting: Family Medicine

## 2016-12-19 DIAGNOSIS — Z7189 Other specified counseling: Secondary | ICD-10-CM | POA: Insufficient documentation

## 2016-12-19 DIAGNOSIS — G4700 Insomnia, unspecified: Secondary | ICD-10-CM | POA: Insufficient documentation

## 2016-12-19 NOTE — Assessment & Plan Note (Signed)
Continue trazodone prn. 

## 2016-12-19 NOTE — Assessment & Plan Note (Signed)
Refer to ENT.  She agrees.

## 2016-12-19 NOTE — Assessment & Plan Note (Signed)
Continue lyrica.  D/w pt.  She agrees.

## 2016-12-19 NOTE — Assessment & Plan Note (Signed)
Controlled, continue current meds.  See notes on labs.

## 2016-12-19 NOTE — Assessment & Plan Note (Signed)
Refer to pulmonary

## 2016-12-19 NOTE — Assessment & Plan Note (Addendum)
Refer to pulmonary.  I want input on meds that would be less likely to aggravate her dysphonia.  She agrees.  See above.   >30 minutes spent in face to face time with patient, >50% spent in counselling or coordination of care re: hx and current dx, see plans.

## 2016-12-19 NOTE — Assessment & Plan Note (Signed)
With h/o abscess, with persistent paresthesia.  Continue lyrica.  She is trying to exercise as tolerated.  S/p rehab stay.

## 2016-12-20 ENCOUNTER — Other Ambulatory Visit: Payer: Self-pay | Admitting: Family Medicine

## 2016-12-20 DIAGNOSIS — I10 Essential (primary) hypertension: Secondary | ICD-10-CM

## 2016-12-26 ENCOUNTER — Other Ambulatory Visit (INDEPENDENT_AMBULATORY_CARE_PROVIDER_SITE_OTHER): Payer: Medicare Other

## 2016-12-26 DIAGNOSIS — I10 Essential (primary) hypertension: Secondary | ICD-10-CM | POA: Diagnosis not present

## 2016-12-26 LAB — BASIC METABOLIC PANEL
BUN: 23 mg/dL (ref 6–23)
CHLORIDE: 102 meq/L (ref 96–112)
CO2: 36 meq/L — AB (ref 19–32)
CREATININE: 1.04 mg/dL (ref 0.40–1.20)
Calcium: 9.1 mg/dL (ref 8.4–10.5)
GFR: 54.18 mL/min — ABNORMAL LOW (ref >60.00)
Glucose, Bld: 98 mg/dL (ref 70–99)
POTASSIUM: 4.4 meq/L (ref 3.5–5.1)
Sodium: 143 mEq/L (ref 135–145)

## 2017-01-06 ENCOUNTER — Other Ambulatory Visit: Payer: Self-pay | Admitting: Family Medicine

## 2017-01-06 NOTE — Telephone Encounter (Signed)
Please call in.  Thanks.   

## 2017-01-06 NOTE — Telephone Encounter (Signed)
Electronic refill request.  Last office visit:   12/17/16 Last Filled:   Lyrica NP never filled here Please advise.

## 2017-01-07 NOTE — Telephone Encounter (Signed)
Medication phoned to pharmacy.  

## 2017-01-14 ENCOUNTER — Other Ambulatory Visit: Payer: Self-pay | Admitting: Family Medicine

## 2017-01-15 ENCOUNTER — Institutional Professional Consult (permissible substitution): Payer: Medicare Other | Admitting: Pulmonary Disease

## 2017-01-15 ENCOUNTER — Ambulatory Visit (INDEPENDENT_AMBULATORY_CARE_PROVIDER_SITE_OTHER): Payer: Medicare Other | Admitting: Pulmonary Disease

## 2017-01-15 ENCOUNTER — Encounter: Payer: Self-pay | Admitting: Pulmonary Disease

## 2017-01-15 VITALS — BP 134/84 | HR 62 | Ht 63.0 in | Wt 242.0 lb

## 2017-01-15 DIAGNOSIS — G4733 Obstructive sleep apnea (adult) (pediatric): Secondary | ICD-10-CM | POA: Diagnosis not present

## 2017-01-15 DIAGNOSIS — J4520 Mild intermittent asthma, uncomplicated: Secondary | ICD-10-CM

## 2017-01-15 NOTE — Progress Notes (Signed)
PULMONARY CONSULT NOTE  Requesting MD/Service: Elsie Stain, M.D. Date of initial consultation: 01/15/17 Reason for consultation: H/O OSA, asthma   PT PROFILE: 82 y.o. female former smoker (20-pack-year, quit 51) with history of OSA diagnosed in 1998 and on CPAP since 1999. Referred for further evaluation of OSA.  DATA: 07/13/13 CTA chest: normal 08/02/13 BLE venous US: no DVT  HPI:  As above. She has been treated with an tolerates CPAP since the time of her diagnosis. Her current setting is 14 cm H2O. She wears it every night and even with naps. She feels it is beneficial but still reports some daytime sleepiness with an Epworth scale score of 9  She also has a documented history of mild intermittent asthma. He was previously on Advair for this but had problems with dysphonia. She uses albuterol as needed but sometimes will go many weeks without requiring it. She has never had PFTs performed.  Past Medical History:  Diagnosis Date  . Arthritis   . Asthma - Previously evaluated by Dr. Raul Del. Deemed mild intermittent 2015   . Dysphonia   . Hypertension   . Melanoma ([place])   . OAB (overactive bladder)   . OSA on CPAP   . 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 Pupil asymmetry    R pupil defect  . Sleep apnea   . Spinal cord abscess     Past Surgical History:  Procedure Laterality Date  . ANTERIOR CERVICAL DECOMP/DISCECTOMY FUSION N/A 07/16/2013   Procedure: ANTERIOR CERVICAL DECOMPRESSION/DISCECTOMY FUSION mutlipleLEVELS C4-7;  Surgeon: Erline Levine, MD;  Location: [place] [facility];  Service: Neurosurgery;  Laterality: N/A;  . APPENDECTOMY    . CATARACT EXTRACTION    . CHOLECYSTECTOMY    . dental implant    . GASTRIC BYPASS    . HIATAL HERNIA REPAIR    . MELANOMA EXCISION    . POSTERIOR CERVICAL FUSION/FORAMINOTOMY N/A 07/28/2013   Procedure: Cervical four-seven  posterior cervical fusion;  Surgeon: Erline Levine, MD;  Location: [place] [facility];  Service:  Neurosurgery;  Laterality: N/A;  . TONSILLECTOMY    . TOTAL KNEE ARTHROPLASTY      MEDICATIONS: I have reviewed all medications and confirmed regimen as documented  Social History   Social History  . Marital status: Widowed    Spouse name: N/A  . Number of children: N/A  . Years of education: N/A   Occupational History  . Not on file.   Social History Main Topics  . Smoking status: Former Research scientist (life sciences)  . Smokeless tobacco: Never Used  . Alcohol use No  . Drug use: No  . Sexual activity: Not on file   Other Topics Concern  . Not on file   Social History Narrative   Marital Status- Widowed    Lives by herself    Employement- Retired Pharmacist, hospital   Exercise hx- Does PT        Family History  Problem Relation Age of Onset  . Breast cancer Mother 43  . Breast cancer Sister 25  . Colon cancer Neg Hx     ROS: No fever, myalgias/arthralgias, unexplained weight loss or weight gain No new focal weakness or sensory deficits No otalgia, hearing loss, visual changes, nasal and sinus symptoms, mouth and throat problems No neck pain or adenopathy No abdominal pain, N/V/D, diarrhea, change in bowel pattern No dysuria, change in urinary pattern   Vitals:   01/15/17 1341 01/15/17 1348  BP:  134/84  Pulse:  62  SpO2:  96%  Weight: 109.8 kg (242 lb)   Height: 5\' 3"  (1.6 m)      EXAM:  Gen: Obese, No overt respiratory distress HEENT: NCAT, sclera white, oropharynx normal Neck: Supple without LAN, thyromegaly, JVD Lungs: breath sounds full without wheezes or other adventitious sounds Cardiovascular: RRR, no murmurs noted Abdomen: Soft, nontender, normal BS Ext: without clubbing, cyanosis. Symmetric pedal edema Neuro: CNs grossly intact, motor and sensory intact Skin: Limited exam, no lesions noted  DATA:   BMP Latest Ref Rng & Units 12/26/2016 12/17/2016 07/10/2015  Glucose 70 - 99 mg/dL 98 79 115(H)  BUN 6 - 23 mg/dL 23 33(H) 16  Creatinine 0.40 - 1.20 mg/dL 1.04 1.20 0.66   Sodium 135 - 145 mEq/L 143 145 140  Potassium 3.5 - 5.1 mEq/L 4.4 4.3 3.2(L)  Chloride 96 - 112 mEq/L 102 104 99(L)  CO2 19 - 32 mEq/L 36(H) 35(H) 33(H)  Calcium 8.4 - 10.5 mg/dL 9.1 9.1 8.6(L)    CBC Latest Ref Rng & Units 12/17/2016 07/10/2015 06/29/2015  WBC 4.0 - 10.5 K/uL 9.1 8.6 13.6(H)  Hemoglobin 12.0 - 15.0 g/dL 12.9 12.8 12.9  Hematocrit 36.0 - 46.0 % 39.2 37.8 38.8  Platelets 150.0 - 400.0 K/uL 213.0 231 247    CXR(07/10/15):  No acute cardiac or pulmonary findings  IMPRESSION:     ICD-10-CM   1. OSA (obstructive sleep apnea) G47.33   2. Mild intermittent asthma without complication M03.49      PLAN:  Split-night sleep study ordered to reassess her CPAP requirements We will contact her with any changes to be made on her CPAP prescription Continue albuterol as needed Follow-up in 3-4 months  Merton Border, MD PCCM service Mobile [phone] Pager [phone] 01/15/2017 2:11 PM

## 2017-01-15 NOTE — Patient Instructions (Signed)
Split night sleep study has been ordered We will contact you with the results and any changes to be made in your CPAP prescription  Follow-up in 3-4 months

## 2017-02-11 ENCOUNTER — Ambulatory Visit: Payer: Medicare Other | Attending: Internal Medicine

## 2017-02-11 DIAGNOSIS — G4700 Insomnia, unspecified: Secondary | ICD-10-CM | POA: Diagnosis not present

## 2017-02-11 DIAGNOSIS — G4733 Obstructive sleep apnea (adult) (pediatric): Secondary | ICD-10-CM | POA: Insufficient documentation

## 2017-02-11 DIAGNOSIS — J45909 Unspecified asthma, uncomplicated: Secondary | ICD-10-CM | POA: Insufficient documentation

## 2017-02-13 ENCOUNTER — Other Ambulatory Visit: Payer: Self-pay | Admitting: Family Medicine

## 2017-02-21 DIAGNOSIS — G4733 Obstructive sleep apnea (adult) (pediatric): Secondary | ICD-10-CM | POA: Diagnosis not present

## 2017-02-24 ENCOUNTER — Telehealth: Payer: Self-pay | Admitting: *Deleted

## 2017-02-24 NOTE — Telephone Encounter (Signed)
-----   Message from Laverle Hobby, MD sent at 02/21/2017  1:21 PM EDT ----- Regarding: split night results Recommend  CPAP with pressure range of 12-16 centimeters of H2O F&P Simplus Full Face, Small

## 2017-02-24 NOTE — Telephone Encounter (Signed)
LMTCB

## 2017-02-25 NOTE — Telephone Encounter (Signed)
LMTCB

## 2017-02-26 NOTE — Telephone Encounter (Signed)
3rd message left for patient to call in regards to sleep study. Message will be closed.

## 2017-02-27 ENCOUNTER — Other Ambulatory Visit: Payer: Self-pay | Admitting: Pulmonary Disease

## 2017-02-27 DIAGNOSIS — G4733 Obstructive sleep apnea (adult) (pediatric): Secondary | ICD-10-CM

## 2017-03-12 ENCOUNTER — Other Ambulatory Visit: Payer: Self-pay | Admitting: Pulmonary Disease

## 2017-03-12 ENCOUNTER — Telehealth: Payer: Self-pay | Admitting: Pulmonary Disease

## 2017-03-12 DIAGNOSIS — G4733 Obstructive sleep apnea (adult) (pediatric): Secondary | ICD-10-CM

## 2017-03-12 NOTE — Telephone Encounter (Signed)
[name] has been referred to [facility] with directions of what is needed. Order has been refaxed per Tiffany's request.

## 2017-03-12 NOTE — Telephone Encounter (Signed)
Please call regarding form that is needed. States she needs pressure change only. Please call after 1 pm

## 2017-03-12 NOTE — Telephone Encounter (Signed)
[facility] calling to request an amended ov from [facility] on 01/15/17 be sent via fax to  [phone] stating patient is compliant and benefiting from cpap

## 2017-03-19 DIAGNOSIS — Z87891 Personal history of nicotine dependence: Secondary | ICD-10-CM

## 2017-03-19 DIAGNOSIS — R0902 Hypoxemia: Secondary | ICD-10-CM | POA: Diagnosis present

## 2017-03-19 DIAGNOSIS — R1013 Epigastric pain: Secondary | ICD-10-CM | POA: Diagnosis not present

## 2017-03-19 DIAGNOSIS — Z791 Long term (current) use of non-steroidal anti-inflammatories (NSAID): Secondary | ICD-10-CM

## 2017-03-19 DIAGNOSIS — Z8661 Personal history of infections of the central nervous system: Secondary | ICD-10-CM

## 2017-03-19 DIAGNOSIS — Z885 Allergy status to narcotic agent status: Secondary | ICD-10-CM

## 2017-03-19 DIAGNOSIS — Z803 Family history of malignant neoplasm of breast: Secondary | ICD-10-CM

## 2017-03-19 DIAGNOSIS — N200 Calculus of kidney: Secondary | ICD-10-CM | POA: Diagnosis present

## 2017-03-19 DIAGNOSIS — J45998 Other asthma: Secondary | ICD-10-CM | POA: Diagnosis present

## 2017-03-19 DIAGNOSIS — Z96659 Presence of unspecified artificial knee joint: Secondary | ICD-10-CM | POA: Diagnosis present

## 2017-03-19 DIAGNOSIS — F458 Other somatoform disorders: Secondary | ICD-10-CM | POA: Diagnosis present

## 2017-03-19 DIAGNOSIS — Z9049 Acquired absence of other specified parts of digestive tract: Secondary | ICD-10-CM

## 2017-03-19 DIAGNOSIS — Z91041 Radiographic dye allergy status: Secondary | ICD-10-CM

## 2017-03-19 DIAGNOSIS — Z79899 Other long term (current) drug therapy: Secondary | ICD-10-CM

## 2017-03-19 DIAGNOSIS — M1990 Unspecified osteoarthritis, unspecified site: Secondary | ICD-10-CM | POA: Diagnosis present

## 2017-03-19 DIAGNOSIS — Z9884 Bariatric surgery status: Secondary | ICD-10-CM

## 2017-03-19 DIAGNOSIS — Z7982 Long term (current) use of aspirin: Secondary | ICD-10-CM

## 2017-03-19 DIAGNOSIS — D509 Iron deficiency anemia, unspecified: Secondary | ICD-10-CM | POA: Diagnosis present

## 2017-03-19 DIAGNOSIS — R634 Abnormal weight loss: Secondary | ICD-10-CM | POA: Diagnosis present

## 2017-03-19 DIAGNOSIS — Z5329 Procedure and treatment not carried out because of patient's decision for other reasons: Secondary | ICD-10-CM | POA: Diagnosis present

## 2017-03-19 DIAGNOSIS — Z888 Allergy status to other drugs, medicaments and biological substances status: Secondary | ICD-10-CM

## 2017-03-19 DIAGNOSIS — E860 Dehydration: Secondary | ICD-10-CM | POA: Diagnosis present

## 2017-03-19 DIAGNOSIS — Z85820 Personal history of malignant melanoma of skin: Secondary | ICD-10-CM

## 2017-03-19 DIAGNOSIS — K222 Esophageal obstruction: Secondary | ICD-10-CM | POA: Diagnosis not present

## 2017-03-19 DIAGNOSIS — G4733 Obstructive sleep apnea (adult) (pediatric): Secondary | ICD-10-CM | POA: Diagnosis present

## 2017-03-19 DIAGNOSIS — Z6841 Body Mass Index (BMI) 40.0 and over, adult: Secondary | ICD-10-CM

## 2017-03-19 DIAGNOSIS — I10 Essential (primary) hypertension: Secondary | ICD-10-CM | POA: Diagnosis present

## 2017-03-19 DIAGNOSIS — K209 Esophagitis, unspecified: Secondary | ICD-10-CM | POA: Diagnosis present

## 2017-03-19 DIAGNOSIS — K769 Liver disease, unspecified: Secondary | ICD-10-CM | POA: Diagnosis present

## 2017-03-19 DIAGNOSIS — N3281 Overactive bladder: Secondary | ICD-10-CM | POA: Diagnosis present

## 2017-03-19 DIAGNOSIS — Z881 Allergy status to other antibiotic agents status: Secondary | ICD-10-CM

## 2017-03-20 ENCOUNTER — Emergency Department: Payer: Medicare Other

## 2017-03-20 ENCOUNTER — Inpatient Hospital Stay
Admission: EM | Admit: 2017-03-20 | Discharge: 2017-03-22 | DRG: 392 | Disposition: A | Discharge status: Home / Self Care | Payer: Medicare Other | Attending: Internal Medicine | Admitting: Internal Medicine

## 2017-03-20 ENCOUNTER — Inpatient Hospital Stay: Payer: Medicare Other

## 2017-03-20 ENCOUNTER — Encounter: Payer: Self-pay | Admitting: Internal Medicine

## 2017-03-20 ENCOUNTER — Other Ambulatory Visit: Payer: Self-pay

## 2017-03-20 DIAGNOSIS — Z87891 Personal history of nicotine dependence: Secondary | ICD-10-CM | POA: Diagnosis not present

## 2017-03-20 DIAGNOSIS — K769 Liver disease, unspecified: Secondary | ICD-10-CM

## 2017-03-20 DIAGNOSIS — Z85820 Personal history of malignant melanoma of skin: Secondary | ICD-10-CM | POA: Diagnosis not present

## 2017-03-20 DIAGNOSIS — R0902 Hypoxemia: Secondary | ICD-10-CM

## 2017-03-20 DIAGNOSIS — K859 Acute pancreatitis without necrosis or infection, unspecified: Secondary | ICD-10-CM

## 2017-03-20 DIAGNOSIS — N200 Calculus of kidney: Secondary | ICD-10-CM | POA: Diagnosis present

## 2017-03-20 DIAGNOSIS — I10 Essential (primary) hypertension: Secondary | ICD-10-CM | POA: Diagnosis not present

## 2017-03-20 DIAGNOSIS — R160 Hepatomegaly, not elsewhere classified: Secondary | ICD-10-CM | POA: Diagnosis not present

## 2017-03-20 DIAGNOSIS — Z96659 Presence of unspecified artificial knee joint: Secondary | ICD-10-CM | POA: Diagnosis present

## 2017-03-20 DIAGNOSIS — Z885 Allergy status to narcotic agent status: Secondary | ICD-10-CM | POA: Diagnosis not present

## 2017-03-20 DIAGNOSIS — Z79899 Other long term (current) drug therapy: Secondary | ICD-10-CM | POA: Diagnosis not present

## 2017-03-20 DIAGNOSIS — Z6841 Body Mass Index (BMI) 40.0 and over, adult: Secondary | ICD-10-CM | POA: Diagnosis not present

## 2017-03-20 DIAGNOSIS — M1990 Unspecified osteoarthritis, unspecified site: Secondary | ICD-10-CM | POA: Diagnosis present

## 2017-03-20 DIAGNOSIS — R634 Abnormal weight loss: Secondary | ICD-10-CM

## 2017-03-20 DIAGNOSIS — J45998 Other asthma: Secondary | ICD-10-CM | POA: Diagnosis present

## 2017-03-20 DIAGNOSIS — R1013 Epigastric pain: Secondary | ICD-10-CM | POA: Diagnosis present

## 2017-03-20 DIAGNOSIS — Z881 Allergy status to other antibiotic agents status: Secondary | ICD-10-CM | POA: Diagnosis not present

## 2017-03-20 DIAGNOSIS — R238 Other skin changes: Secondary | ICD-10-CM | POA: Diagnosis not present

## 2017-03-20 DIAGNOSIS — Z9884 Bariatric surgery status: Secondary | ICD-10-CM | POA: Diagnosis not present

## 2017-03-20 DIAGNOSIS — N3281 Overactive bladder: Secondary | ICD-10-CM | POA: Diagnosis present

## 2017-03-20 DIAGNOSIS — F458 Other somatoform disorders: Secondary | ICD-10-CM | POA: Diagnosis present

## 2017-03-20 DIAGNOSIS — Z791 Long term (current) use of non-steroidal anti-inflammatories (NSAID): Secondary | ICD-10-CM | POA: Diagnosis not present

## 2017-03-20 DIAGNOSIS — R0989 Other specified symptoms and signs involving the circulatory and respiratory systems: Secondary | ICD-10-CM

## 2017-03-20 DIAGNOSIS — K222 Esophageal obstruction: Secondary | ICD-10-CM | POA: Diagnosis present

## 2017-03-20 DIAGNOSIS — E860 Dehydration: Secondary | ICD-10-CM | POA: Diagnosis present

## 2017-03-20 DIAGNOSIS — D649 Anemia, unspecified: Secondary | ICD-10-CM | POA: Diagnosis not present

## 2017-03-20 DIAGNOSIS — R09A2 Foreign body sensation, throat: Secondary | ICD-10-CM

## 2017-03-20 DIAGNOSIS — K209 Esophagitis, unspecified: Secondary | ICD-10-CM | POA: Diagnosis present

## 2017-03-20 DIAGNOSIS — Z888 Allergy status to other drugs, medicaments and biological substances status: Secondary | ICD-10-CM | POA: Diagnosis not present

## 2017-03-20 DIAGNOSIS — R1310 Dysphagia, unspecified: Secondary | ICD-10-CM

## 2017-03-20 DIAGNOSIS — R109 Unspecified abdominal pain: Secondary | ICD-10-CM

## 2017-03-20 DIAGNOSIS — Z8661 Personal history of infections of the central nervous system: Secondary | ICD-10-CM | POA: Diagnosis not present

## 2017-03-20 DIAGNOSIS — Z803 Family history of malignant neoplasm of breast: Secondary | ICD-10-CM | POA: Diagnosis not present

## 2017-03-20 DIAGNOSIS — D490 Neoplasm of unspecified behavior of digestive system: Secondary | ICD-10-CM | POA: Diagnosis not present

## 2017-03-20 DIAGNOSIS — J45909 Unspecified asthma, uncomplicated: Secondary | ICD-10-CM

## 2017-03-20 DIAGNOSIS — D509 Iron deficiency anemia, unspecified: Secondary | ICD-10-CM

## 2017-03-20 DIAGNOSIS — Z9989 Dependence on other enabling machines and devices: Secondary | ICD-10-CM | POA: Diagnosis not present

## 2017-03-20 DIAGNOSIS — G4733 Obstructive sleep apnea (adult) (pediatric): Secondary | ICD-10-CM | POA: Diagnosis not present

## 2017-03-20 LAB — COMPREHENSIVE METABOLIC PANEL
ALBUMIN: 3.6 g/dL (ref 3.5–5.0)
ALT: 13 U/L — ABNORMAL LOW (ref 14–54)
AST: 21 U/L (ref 15–41)
Alkaline Phosphatase: 66 U/L (ref 38–126)
Anion gap: 11 (ref 5–15)
BUN: 36 mg/dL — ABNORMAL HIGH (ref 6–20)
CHLORIDE: 103 mmol/L (ref 101–111)
CO2: 28 mmol/L (ref 22–32)
CREATININE: 0.83 mg/dL (ref 0.44–1.00)
Calcium: 8.8 mg/dL — ABNORMAL LOW (ref 8.9–10.3)
GFR calc Af Amer: >60 mL/min (ref >60)
GFR calc non Af Amer: >60 mL/min (ref >60)
Glucose, Bld: 169 mg/dL — ABNORMAL HIGH (ref 65–99)
POTASSIUM: 4 mmol/L (ref 3.5–5.1)
SODIUM: 142 mmol/L (ref 135–145)
Total Bilirubin: 0.5 mg/dL (ref 0.3–1.2)
Total Protein: 7 g/dL (ref 6.5–8.1)

## 2017-03-20 LAB — CBC WITH DIFFERENTIAL/PLATELET
BASOS ABS: 0 10*3/uL (ref 0–0.1)
BASOS PCT: 0 %
EOS ABS: 0.1 10*3/uL (ref 0–0.7)
EOS PCT: 1 %
HCT: 33.5 % — ABNORMAL LOW (ref 35.0–47.0)
Hemoglobin: 11.2 g/dL — ABNORMAL LOW (ref 12.0–16.0)
LYMPHS PCT: 9 %
Lymphs Abs: 1 10*3/uL (ref 1.0–3.6)
MCH: 30.6 pg (ref 26.0–34.0)
MCHC: 33.3 g/dL (ref 32.0–36.0)
MCV: 92.1 fL (ref 80.0–100.0)
MONO ABS: 0.4 10*3/uL (ref 0.2–0.9)
Monocytes Relative: 4 %
Neutro Abs: 9.2 10*3/uL — ABNORMAL HIGH (ref 1.4–6.5)
Neutrophils Relative %: 86 %
PLATELETS: 212 10*3/uL (ref 150–440)
RBC: 3.64 MIL/uL — AB (ref 3.80–5.20)
RDW: 13.5 % (ref 11.5–14.5)
WBC: 10.7 10*3/uL (ref 3.6–11.0)

## 2017-03-20 LAB — BLOOD GAS, ARTERIAL
ACID-BASE EXCESS: 3.6 mmol/L — AB (ref 0.0–2.0)
Bicarbonate: 29.1 mmol/L — ABNORMAL HIGH (ref 20.0–28.0)
FIO2: 0.28
O2 SAT: 94.5 %
PCO2 ART: 47 mmHg (ref 32.0–48.0)
PH ART: 7.4 (ref 7.350–7.450)
Patient temperature: 37
pO2, Arterial: 73 mmHg — ABNORMAL LOW (ref 83.0–108.0)

## 2017-03-20 LAB — LIPASE, BLOOD: LIPASE: 71 U/L — AB (ref 11–51)

## 2017-03-20 LAB — BRAIN NATRIURETIC PEPTIDE: B NATRIURETIC PEPTIDE 5: 27 pg/mL (ref 0.0–100.0)

## 2017-03-20 LAB — TROPONIN I: Troponin I: <0.03 ng/mL (ref <0.03)

## 2017-03-20 MED ORDER — ONDANSETRON HCL 4 MG/2ML IJ SOLN
4.0000 mg | Freq: Once | INTRAMUSCULAR | Status: AC
Start: 2017-03-20 — End: 2017-03-20
  Administered 2017-03-20: 4 mg via INTRAVENOUS
  Filled 2017-03-20: qty 2

## 2017-03-20 MED ORDER — ONDANSETRON HCL 4 MG PO TABS: 4.0000 mg | ORAL_TABLET | Freq: Four times a day (QID) | ORAL | Status: DC | PRN

## 2017-03-20 MED ORDER — METOPROLOL SUCCINATE ER 50 MG PO TB24
50.0000 mg | ORAL_TABLET | Freq: Every day | ORAL | Status: DC
  Administered 2017-03-20 – 2017-03-22 (×3): 50 mg via ORAL
  Filled 2017-03-20 (×3): qty 1

## 2017-03-20 MED ORDER — IPRATROPIUM-ALBUTEROL 0.5-2.5 (3) MG/3ML IN SOLN
3.0000 mL | Freq: Once | RESPIRATORY_TRACT | Status: AC
Start: 2017-03-20 — End: 2017-03-20
  Administered 2017-03-20: 3 mL via RESPIRATORY_TRACT
  Filled 2017-03-20: qty 3

## 2017-03-20 MED ORDER — ACETAMINOPHEN 650 MG RE SUPP: 650.0000 mg | Freq: Four times a day (QID) | RECTAL | Status: DC | PRN

## 2017-03-20 MED ORDER — LISINOPRIL 5 MG PO TABS
7.5000 mg | ORAL_TABLET | Freq: Every day | ORAL | Status: DC
Start: 2017-03-20 — End: 2017-03-22
  Filled 2017-03-20 (×2): qty 2

## 2017-03-20 MED ORDER — ONDANSETRON HCL 4 MG/2ML IJ SOLN
INTRAMUSCULAR | Status: AC
  Administered 2017-03-20: 4 mg via INTRAVENOUS
  Filled 2017-03-20: qty 2

## 2017-03-20 MED ORDER — ACETAMINOPHEN 325 MG PO TABS
650.0000 mg | ORAL_TABLET | Freq: Four times a day (QID) | ORAL | Status: DC | PRN
  Filled 2017-03-20: qty 2

## 2017-03-20 MED ORDER — GLUCAGON HCL (RDNA) 1 MG IJ SOLR
1.0000 mg | Freq: Once | INTRAMUSCULAR | Status: AC
  Administered 2017-03-20: 1 mg via INTRAVENOUS
  Filled 2017-03-20: qty 1

## 2017-03-20 MED ORDER — ONDANSETRON HCL 4 MG/2ML IJ SOLN
4.0000 mg | Freq: Once | INTRAMUSCULAR | Status: AC
Start: 2017-03-20 — End: 2017-03-20
  Administered 2017-03-20: 4 mg via INTRAVENOUS

## 2017-03-20 MED ORDER — TECHNETIUM TO 99M ALBUMIN AGGREGATED
4.2300 | Freq: Once | INTRAVENOUS | Status: AC | PRN
  Administered 2017-03-20: 14:00:00 4.23 via INTRAVENOUS

## 2017-03-20 MED ORDER — PREGABALIN 75 MG PO CAPS
150.0000 mg | ORAL_CAPSULE | Freq: Two times a day (BID) | ORAL | Status: DC
  Administered 2017-03-20 – 2017-03-22 (×4): 150 mg via ORAL
  Filled 2017-03-20 (×4): qty 2

## 2017-03-20 MED ORDER — SODIUM CHLORIDE 0.9 % IV SOLN
INTRAVENOUS | Status: DC
  Administered 2017-03-20 – 2017-03-22 (×3): via INTRAVENOUS

## 2017-03-20 MED ORDER — FENTANYL CITRATE (PF) 100 MCG/2ML IJ SOLN
25.0000 ug | Freq: Once | INTRAMUSCULAR | Status: AC
  Administered 2017-03-20: 25 ug via INTRAVENOUS

## 2017-03-20 MED ORDER — FLUTICASONE PROPIONATE 50 MCG/ACT NA SUSP
1.0000 | Freq: Two times a day (BID) | NASAL | Status: DC
  Administered 2017-03-20 – 2017-03-22 (×3): 1 via NASAL
  Filled 2017-03-20: qty 16

## 2017-03-20 MED ORDER — VITAMIN D 1000 UNITS PO TABS
2000.0000 [IU] | ORAL_TABLET | Freq: Every day | ORAL | Status: DC
  Administered 2017-03-20: 2000 [IU] via ORAL
  Filled 2017-03-20 (×2): qty 2

## 2017-03-20 MED ORDER — SENNOSIDES-DOCUSATE SODIUM 8.6-50 MG PO TABS: 1.0000 | ORAL_TABLET | Freq: Every evening | ORAL | Status: DC | PRN

## 2017-03-20 MED ORDER — FENTANYL CITRATE (PF) 100 MCG/2ML IJ SOLN
INTRAMUSCULAR | Status: AC
  Administered 2017-03-20: 25 ug via INTRAVENOUS
  Filled 2017-03-20: qty 2

## 2017-03-20 MED ORDER — SODIUM CHLORIDE 0.9 % IV SOLN
INTRAVENOUS | Status: DC
  Administered 2017-03-21: 13:00:00 via INTRAVENOUS

## 2017-03-20 MED ORDER — METOCLOPRAMIDE HCL 5 MG/ML IJ SOLN
2.5000 mg | Freq: Once | INTRAMUSCULAR | Status: AC
  Administered 2017-03-20: 2.5 mg via INTRAVENOUS
  Filled 2017-03-20: qty 2

## 2017-03-20 MED ORDER — ENOXAPARIN SODIUM 40 MG/0.4ML ~~LOC~~ SOLN
40.0000 mg | SUBCUTANEOUS | Status: DC
Start: 2017-03-20 — End: 2017-03-21
  Administered 2017-03-20: 40 mg via SUBCUTANEOUS
  Filled 2017-03-20: qty 0.4

## 2017-03-20 MED ORDER — VITAMIN B-12 1000 MCG PO TABS
500.0000 ug | ORAL_TABLET | ORAL | Status: DC
  Filled 2017-03-20: qty 1

## 2017-03-20 MED ORDER — TECHNETIUM TC 99M DIETHYLENETRIAME-PENTAACETIC ACID
32.8800 | Freq: Once | INTRAVENOUS | Status: AC | PRN
  Administered 2017-03-20: 32.88 via RESPIRATORY_TRACT

## 2017-03-20 MED ORDER — ASPIRIN EC 81 MG PO TBEC
81.0000 mg | DELAYED_RELEASE_TABLET | ORAL | Status: DC
  Filled 2017-03-20: qty 1

## 2017-03-20 MED ORDER — TRAZODONE HCL 50 MG PO TABS
25.0000 mg | ORAL_TABLET | Freq: Every evening | ORAL | Status: DC | PRN
  Administered 2017-03-21: 25 mg via ORAL
  Filled 2017-03-20: qty 1

## 2017-03-20 MED ORDER — TRAMADOL HCL 50 MG PO TABS
50.0000 mg | ORAL_TABLET | Freq: Four times a day (QID) | ORAL | Status: DC | PRN
  Administered 2017-03-21 – 2017-03-22 (×2): 50 mg via ORAL
  Filled 2017-03-20 (×2): qty 1

## 2017-03-20 MED ORDER — SODIUM CHLORIDE 0.9 % IV BOLUS (SEPSIS)
500.0000 mL | Freq: Once | INTRAVENOUS | Status: AC
  Administered 2017-03-20: 500 mL via INTRAVENOUS

## 2017-03-20 MED ORDER — OXYBUTYNIN CHLORIDE 5 MG PO TABS
5.0000 mg | ORAL_TABLET | Freq: Two times a day (BID) | ORAL | Status: DC
  Administered 2017-03-20 – 2017-03-22 (×4): 5 mg via ORAL
  Filled 2017-03-20 (×4): qty 1

## 2017-03-20 MED ORDER — ONDANSETRON HCL 4 MG/2ML IJ SOLN: 4.0000 mg | Freq: Four times a day (QID) | INTRAMUSCULAR | Status: DC | PRN

## 2017-03-20 NOTE — Progress Notes (Signed)
Attempted to see patient today and she is off the floor for V/Q scan. Will see patient tomorrow.

## 2017-03-20 NOTE — ED Triage Notes (Signed)
Pt to triage via w/c with no distress noted; pt reports at 8pm, wa eating grilled cheese sandwich and "didn't chew it up good"; now feels that it is stuck; st attempted to drink fluids but it won't go down; denies hx of same

## 2017-03-20 NOTE — Progress Notes (Signed)
[facility] at [facility]                                                                                                                                                                                  [name], is a 82 y.o. female, DOB - 03-01-1935, NGE:[id number]  Admit date - 03/20/2017   Admitting Physician Saundra Shelling, MD  Outpatient Primary MD for the patient is [name] Ghent, MD   LOS - 0  Subjective: Patient admitted with abdominal pain also noticed to have hypoxia Patient states that her symptoms started yesterday. She also complains of weight loss of greater than 30 pounds over the past year Also had some dysphagia Lower abdominal pain is resolved   Review of Systems:   CONSTITUTIONAL: No documented fever. No fatigue, weakness. No weight gain, no weight loss.  EYES: No blurry or double vision.  ENT: No tinnitus. No postnasal drip. No redness of the oropharynx.  RESPIRATORY: No cough, no wheeze, no hemoptysis. No dyspnea.  CARDIOVASCULAR: No chest pain. No orthopnea. No palpitations. No syncope.  GASTROINTESTINAL: No nausea, no vomiting or diarrhea.  Positive abdominal pain. No melena or hematochezia.  GENITOURINARY: No dysuria or hematuria.  ENDOCRINE: No polyuria or nocturia. No heat or cold intolerance.  HEMATOLOGY: No anemia. No bruising. No bleeding.  INTEGUMENTARY: No rashes. No lesions.  MUSCULOSKELETAL: No arthritis. No swelling. No gout.  NEUROLOGIC: No numbness, tingling, or ataxia. No seizure-type activity.  PSYCHIATRIC: No anxiety. No insomnia. No ADD.    Vitals:   Vitals:   03/20/17 0647 03/20/17 1007 03/20/17 1009 03/20/17 1244  BP: (!) 107/45 (!) 119/40 (!) 125/43 (!) 112/36  Pulse: 83  76 65  Resp: 20  18 18   Temp: 98.4 F (36.9 C)  98.1 F (36.7 C) 98.7 F (37.1 C)  TempSrc: Oral  Oral Oral  SpO2: 98%  98% 97%  Weight:      Height:        Wt Readings from Last 3 Encounters:   03/20/17 235 lb (106.6 kg)  01/15/17 242 lb (109.8 kg)  12/17/16 238 lb (108 kg)     Intake/Output Summary (Last 24 hours) at 03/20/2017 1334 Last data filed at 03/20/2017 1100 Gross per 24 hour  Intake 620 ml  Output -  Net 620 ml    Physical Exam:   GENERAL: Pleasant-appearing in no apparent distress.  HEAD, EYES, EARS, NOSE AND THROAT: Atraumatic, normocephalic. Extraocular muscles are intact. Pupils equal and reactive to light. Sclerae anicteric. No conjunctival injection. No oro-pharyngeal erythema.  NECK: Supple. There is no jugular venous distention. No bruits, no lymphadenopathy, no thyromegaly.  HEART: Regular rate and rhythm,. No murmurs, no rubs, no clicks.  LUNGS: Clear to auscultation bilaterally. No rales or rhonchi. No wheezes.  ABDOMEN: Soft, flat, nontender, nondistended. Has good bowel sounds. No hepatosplenomegaly appreciated.  EXTREMITIES: No evidence of any cyanosis, clubbing, or peripheral edema.  +2 pedal and radial pulses bilaterally.  NEUROLOGIC: The patient is alert, awake, and oriented x3 with no focal motor or sensory deficits appreciated bilaterally.  SKIN: Moist and warm with no rashes appreciated.  Psych: Not anxious, depressed LN: No inguinal LN enlargement    Antibiotics   Anti-infectives (From admission, onward)   None      Medications   Scheduled Meds: . aspirin EC  81 mg Oral QODAY  . cholecalciferol  2,000 Units Oral Daily  . [START ON 03/21/2017] cyanocobalamin  500 mcg Oral QODAY  . enoxaparin (LOVENOX) injection  40 mg Subcutaneous Q24H  . fluticasone  1 spray Each Nare BID  . lisinopril  7.5 mg Oral Daily  . metoprolol succinate  50 mg Oral Daily  . oxybutynin  5 mg Oral BID  . pregabalin  150 mg Oral BID   Continuous Infusions: . sodium chloride 75 mL/hr at 03/20/17 1326   PRN Meds:.acetaminophen **OR** acetaminophen, ondansetron **OR** ondansetron (ZOFRAN) IV, senna-docusate, traMADol, traZODone   Data Review:   Micro  Results No results found for this or any previous visit (from the past 240 hour(s)).  Radiology Reports Ct Abdomen Pelvis Wo Contrast  Result Date: 03/20/2017 CLINICAL DATA:  foreign body sensation EXAM: CT ABDOMEN AND PELVIS WITHOUT CONTRAST TECHNIQUE: Multidetector CT imaging of the abdomen and pelvis was performed following the standard protocol without IV contrast. COMPARISON:  06/29/2015 FINDINGS: Lower chest: Lung bases demonstrate no acute consolidation or pleural effusion. Mild cardiomegaly. Mitral calcifications. Hepatobiliary: Interim development of multiple hypodense liver masses. Question slight nodularity of the liver contour. Surgical absence of the gallbladder. No biliary dilatation. Pancreas: Unremarkable. No pancreatic ductal dilatation or surrounding inflammatory changes. Spleen: Normal in size without focal abnormality. Adrenals/Urinary Tract: Adrenal glands are within normal limits. Staghorn calculus in the lower pole of the left kidney measuring up to 17 mm. No hydronephrosis. Bladder normal. Stomach/Bowel: Status post gastric bypass. Distended gastric pouch filled with fluid and food material which extends into the jejunal limb. No dilated small bowel distal to this is suggest obstruction. No colon wall thickening. Vascular/Lymphatic: Aortic atherosclerosis. Enlarged gastrohepatic lymph node measuring 14 mm. Additional prominent porta hepatis lymph nodes. Reproductive: Status post hysterectomy. No adnexal masses. Other: Negative for free air or free fluid. Musculoskeletal: Degenerative changes of the spine. Grade 1 anterolisthesis of L4 on L5. No acute or suspicious bone lesion is seen. Lucent lesion in the left femoral neck is unchanged. IMPRESSION: 1. Status post gastric bypass with distension of the gastric pouch by fluid and food bolus. There is some extension of the food bolus into the jejunal limb. No dilated small bowel distal to this is suggest obstruction. 2. Interim development  of multiple hypo dense liver masses, concerning for metastatic disease or liver primary. Follow-up contrast-enhanced CT and or MRI is recommended. 3. Enlarged gastrohepatic and porta hepatis nodes are concerning for metastatic disease. 4. Staghorn calculus on the left.  No hydronephrosis. Electronically Signed   By: Donavan Foil M.D.   On: 03/20/2017 03:59   Dg Neck Soft Tissue  Result Date: 03/20/2017 CLINICAL DATA:  Feels like food stuck in the throat. Shortness of breath. EXAM: NECK SOFT TISSUES - 1+ VIEW COMPARISON:  Cervical spine  09/24/2013 FINDINGS: There is no evidence of retropharyngeal soft tissue swelling or epiglottic enlargement. The cervical airway is unremarkable and no radio-opaque foreign body identified. Postoperative changes with anterior and posterior fixation of the cervical spine. IMPRESSION: No radiopaque soft tissue foreign bodies are identified. Postoperative changes in the cervical spine. Electronically Signed   By: Lucienne Capers M.D.   On: 03/20/2017 02:34   Dg Chest 2 View  Result Date: 03/20/2017 CLINICAL DATA:  Feels like food stuck in the throat. Shortness of breath. EXAM: CHEST  2 VIEW COMPARISON:  07/10/2015 FINDINGS: Mild cardiac enlargement. No pulmonary vascular congestion or edema. Peribronchial thickening with central interstitial changes suggesting chronic bronchitis. Linear scarring in the left mid lung. No focal consolidation. No blunting of costophrenic angles. No pneumothorax. Mediastinal contours appear intact. Postoperative changes in the cervical spine. Degenerative changes in the thoracic spine. No apparent esophageal dilatation. IMPRESSION: Cardiac enlargement. Chronic bronchitic changes in the lungs. No evidence of active pulmonary disease. Electronically Signed   By: Lucienne Capers M.D.   On: 03/20/2017 02:35     CBC Recent Labs  Lab 03/20/17 0116  WBC 10.7  HGB 11.2*  HCT 33.5*  PLT 212  MCV 92.1  MCH 30.6  MCHC 33.3  RDW 13.5  LYMPHSABS  1.0  MONOABS 0.4  EOSABS 0.1  BASOSABS 0.0    Chemistries  Recent Labs  Lab 03/20/17 0116  NA 142  K 4.0  CL 103  CO2 28  GLUCOSE 169*  BUN 36*  CREATININE 0.83  CALCIUM 8.8*  AST 21  ALT 13*  ALKPHOS 66  BILITOT 0.5   ------------------------------------------------------------------------------------------------------------------ estimated creatinine clearance is 63.2 mL/min (by C-G formula based on SCr of 0.83 mg/dL). ------------------------------------------------------------------------------------------------------------------ No results for input(s): HGBA1C in the last 72 hours. ------------------------------------------------------------------------------------------------------------------ No results for input(s): CHOL, HDL, LDLCALC, TRIG, CHOLHDL, LDLDIRECT in the last 72 hours. ------------------------------------------------------------------------------------------------------------------ No results for input(s): TSH, T4TOTAL, T3FREE, THYROIDAB in the last 72 hours.  Invalid input(s): FREET3 ------------------------------------------------------------------------------------------------------------------ No results for input(s): VITAMINB12, FOLATE, FERRITIN, TIBC, IRON, RETICCTPCT in the last 72 hours.  Coagulation profile No results for input(s): INR, PROTIME in the last 168 hours.  No results for input(s): DDIMER in the last 72 hours.  Cardiac Enzymes Recent Labs  Lab 03/20/17 0116  TROPONINI <0.03   ------------------------------------------------------------------------------------------------------------------ Invalid input(s): POCBNP    Assessment & Plan   Patient is a 82 year old white female presenting with abdominal pain and hypoxia  1.  Abdominal pain could be related to underlying process that is noted on the CT scan GI consult recommends possible endoscopy inpatient or outpatient I will make n.p.o. she will be remaining in the  hospital for further workup of her other symptoms  2.  Hypoxia of the etiology unclear chest x-ray is negative Will obtain VQ scan as well as echo of the heart Wean oxygen as tolerated  3.  Liver lesion as well as intra-abdominal lymph node enlargements Oncology consult is pending  4. Renal calculus which is chronic cancel urology consult  5.  Obstructive sleep apnea continue CPAP  6.  Essential hypertension continue therapy with lisinopril and metoprolol  7.  Miscellaneous Lovenox for DVT prophylaxis      Code Status Orders  (From admission, onward)        Start     Ordered   03/20/17 0636  Full code  Continuous     03/20/17 0635    Code Status History    Date Active Date Inactive Code Status Order ID  Comments User Context   07/31/2013 18:54 08/26/2013 21:06 Full Code [id number]  Meredith Staggers, MD Inpatient   07/30/2013 19:45 07/31/2013 18:54 Full Code [id number]  Cathlyn Parsons, PA-C Inpatient   07/28/2013 18:11 07/30/2013 19:45 Full Code [id number]  Erline Levine, MD Inpatient   07/22/2013 15:06 07/28/2013 14:14 Full Code [id number]  Cathlyn Parsons, PA-C Inpatient   07/22/2013 15:06 07/22/2013 15:06 Full Code [id number]  Cathlyn Parsons, PA-C Inpatient   07/16/2013 05:45 07/22/2013 15:06 Full Code [id number]  Erline Levine, MD Inpatient           Consults gi, oncology   DVT Prophylaxis  lovenox  Lab Results  Component Value Date   PLT 212 03/20/2017     Time Spent in minutes   51min  Greater than 50% of time spent in care coordination and counseling patient regarding the condition and plan of care.   Dustin Flock M.D on 03/20/2017 at 1:34 PM  Between 7am to 6pm - Pager - [phone]  After 6pm go to [email or web address] - password [email or web address] [facility]   Office  [phone]

## 2017-03-20 NOTE — ED Notes (Signed)
Patient transported to CT 

## 2017-03-20 NOTE — ED Notes (Signed)
Pt c/o sharp pain to upper mid abdomen s/p consuming a grilled cheese sandwich. Pt reports it getting stuck. States this has been happening "more and more the last month."

## 2017-03-20 NOTE — H&P (Signed)
[facility] [place] NAME: [name]    MR#:  [id number]  DATE OF BIRTH:  1934-09-15  DATE OF ADMISSION:  03/20/2017  PRIMARY CARE PHYSICIAN: Tonia Ghent, MD   REQUESTING/REFERRING PHYSICIAN:   CHIEF COMPLAINT:  No chief complaint on file.   HISTORY OF PRESENT ILLNESS: [name]  is a 82 y.o. female with a known history of sleep apnea, bronchial asthma, hypertension, arthritis presented to the emergency room epigastric discomfort.  Patient also complains of food being stuck near the throat area.  This kind of sensation and movement for the last few months.  The epigastric pain is aching in nature 4 out of 10 on a scale of 1-10.  Patient also felt short of breath when she presented to emergency room her O2 sats were only 83% she was put on oxygen by nasal cannula.  Patient was opened with CT abdomen which showed multiple masses in the liver unknown etiology.  CT abdomen also showed a staghorn calculus in the left kidney.  Hospitalist service was consulted.  No complains of any left flank pain.  No fever or chills.  PAST MEDICAL HISTORY:   Past Medical History:  Diagnosis Date  . Arthritis   . Asthma   . Dysphonia   . Hypertension   . Melanoma ([place])   . OAB (overactive bladder)   . OSA on CPAP   . 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 Pupil asymmetry    R pupil defect  . Sleep apnea   . Spinal cord abscess     PAST SURGICAL HISTORY:  Past Surgical History:  Procedure Laterality Date  . APPENDECTOMY    . CATARACT EXTRACTION    . CHOLECYSTECTOMY    . dental implant    . GASTRIC BYPASS    . HIATAL HERNIA REPAIR    . MELANOMA EXCISION    . TONSILLECTOMY    . TOTAL KNEE ARTHROPLASTY      SOCIAL HISTORY:  Social History   Tobacco Use  . Smoking status: Former Research scientist (life sciences)  . Smokeless tobacco: Never Used  Substance Use Topics  . Alcohol use: No    FAMILY HISTORY:  Family History  Problem  Relation Age of Onset  . Breast cancer Mother 22  . Breast cancer Sister 58  . Colon cancer Neg Hx     DRUG ALLERGIES:  Allergies  Allergen Reactions  . Codeine Nausea Only  . Doxycycline Other (See Comments)    Lip swelling  . Gabapentin Other (See Comments)    Swelling.   . Iohexol Itching    07-15-13 pt developed itching on fingers after contrast given. Dr. Irish Elders looked at pt and said to put in system as allergy. BB  . Other Other (See Comments)    Contrast Dye- caused fever, chills, awful feeling  . Oxycodone Other (See Comments)    nausea  . Quinolones Swelling    Lip swelling  . Synvisc [[place] G-F 20]     REVIEW OF SYSTEMS:   CONSTITUTIONAL: No fever, fatigue or weakness.  EYES: No blurred or double vision.  EARS, NOSE, AND THROAT: No tinnitus or ear pain.  RESPIRATORY: No cough,had shortness of breath,  No wheezing or hemoptysis.  CARDIOVASCULAR: No chest pain, orthopnea, edema.  GASTROINTESTINAL: No nausea, vomiting, diarrhea   Has epigastric pain.  GENITOURINARY: No dysuria, hematuria.  ENDOCRINE: No polyuria, nocturia,  HEMATOLOGY: No anemia, easy bruising or  bleeding SKIN: No rash or lesion. MUSCULOSKELETAL: No joint pain or arthritis.   NEUROLOGIC: No tingling, numbness, weakness.  PSYCHIATRY: No anxiety or depression.   MEDICATIONS AT HOME:  Prior to Admission medications   Medication Sig Start Date End Date Taking? Authorizing Provider  acetaminophen (TYLENOL) 325 MG tablet Take 650 mg by mouth every 6 (six) hours as needed.   Yes [provider]  aspirin 81 MG tablet Take 81 mg by mouth every other day. Alternating with vitamin b-12   Yes [provider]  calcium carbonate (TUMS EX) 750 MG chewable tablet Chew 1 tablet by mouth daily.   Yes [provider]  Cholecalciferol (VITAMIN D) 2000 UNITS CAPS Take 2,000 Units by mouth daily.   Yes [provider]  cyanocobalamin 500 MCG tablet Take 500 mcg every other day by  mouth.   Yes [provider]  fluticasone (FLONASE) 50 MCG/ACT nasal spray Place 1 spray into both nostrils 2 (two) times daily.    Yes [provider]  furosemide (LASIX) 20 MG tablet TAKE 1-2 TABLETS BY MOUTH DAILY IF NEEDED FOR EDEMA 01/06/17  Yes Tonia Ghent, MD  hydrochlorothiazide (HYDRODIURIL) 12.5 MG tablet TAKE 1 TABLET BY MOUTH DAILY 02/13/17  Yes Tonia Ghent, MD  lisinopril (PRINIVIL,ZESTRIL) 5 MG tablet Take 7.5 mg daily by mouth.   Yes [provider]  loratadine (CLARITIN) 10 MG tablet Take 10 mg by mouth daily.   Yes [provider]  LYRICA 150 MG capsule TAKE 1 CAPSULE BY MOUTH TWO TIMES DAILY 01/06/17  Yes Tonia Ghent, MD  meloxicam (MOBIC) 7.5 MG tablet Take 7.5 mg by mouth daily as needed.   Yes [provider]  metoprolol succinate (TOPROL-XL) 50 MG 24 hr tablet TAKE 1 TABLET BY MOUTH DAILY 01/14/17  Yes Tonia Ghent, MD  naproxen sodium (ANAPROX) 220 MG tablet Take 220 mg daily as needed by mouth.    Yes [provider]  oxybutynin (DITROPAN) 5 MG tablet TAKE 1 TABLET BY MOUTH IN THE MORNING AND 1/2 TABLET AT BEDTIME 01/06/17  Yes Tonia Ghent, MD  pregabalin (LYRICA) 75 MG capsule Take 75 mg daily by mouth.   Yes [provider]  traMADol (ULTRAM) 50 MG tablet Take 0.5 tablet (25 mg total) in the morning and 0.5 tablet ( 25 mg total)at noon as needed. 11/11/13  Yes [provider]  traZODone (DESYREL) 50 MG tablet Take 25 mg at bedtime as needed by mouth for sleep. Take 0.5 tablet (25 mg total) at bedtime 06/30/13  Yes [provider]  lisinopril (PRINIVIL,ZESTRIL) 5 MG tablet TAKE 1 TABLET BY MOUTH TWICE DAILY Patient not taking: Reported on 03/20/2017 02/13/17   Tonia Ghent, MD      PHYSICAL EXAMINATION:   VITAL SIGNS: Blood pressure (!) 155/70, pulse 87, temperature 98 F (36.7 C), temperature source Oral, resp. rate 19, height 5\' 3"  (1.6 m), weight 106.6 kg (235 lb), SpO2  98 %.  GENERAL:  82 y.o.-year-old patient lying in the bed with no acute distress.  EYES: Pupils equal, round, reactive to light and accommodation. No scleral icterus. Extraocular muscles intact.  HEENT: Head atraumatic, normocephalic. Oropharynx and nasopharynx clear.  NECK:  Supple, no jugular venous distention. No thyroid enlargement, no tenderness.  LUNGS: Normal breath sounds bilaterally, no wheezing, rales,rhonchi or crepitation. No use of accessory muscles of respiration.  CARDIOVASCULAR: S1, S2 normal. No murmurs, rubs, or gallops.  ABDOMEN: Soft, mild tenderness in epigastrium, nondistended.  Bowel sounds present. No organomegaly or mass.  EXTREMITIES: No pedal edema, cyanosis, or clubbing.  NEUROLOGIC: Cranial nerves II through XII are intact. Muscle strength 5/5 in all extremities. Sensation intact. Gait not checked.  PSYCHIATRIC: The patient is alert and oriented x 3.  SKIN: No obvious rash, lesion, or ulcer.   LABORATORY PANEL:   CBC Recent Labs  Lab 03/20/17 0116  WBC 10.7  HGB 11.2*  HCT 33.5*  PLT 212  MCV 92.1  MCH 30.6  MCHC 33.3  RDW 13.5  LYMPHSABS 1.0  MONOABS 0.4  EOSABS 0.1  BASOSABS 0.0   ------------------------------------------------------------------------------------------------------------------  Chemistries  Recent Labs  Lab 03/20/17 0116  NA 142  K 4.0  CL 103  CO2 28  GLUCOSE 169*  BUN 36*  CREATININE 0.83  CALCIUM 8.8*  AST 21  ALT 13*  ALKPHOS 66  BILITOT 0.5   ------------------------------------------------------------------------------------------------------------------ estimated creatinine clearance is 63.2 mL/min (by C-G formula based on SCr of 0.83 mg/dL). ------------------------------------------------------------------------------------------------------------------ No results for input(s): TSH, T4TOTAL, T3FREE, THYROIDAB in the last 72 hours.  Invalid input(s): FREET3   Coagulation profile No results for  input(s): INR, PROTIME in the last 168 hours. ------------------------------------------------------------------------------------------------------------------- No results for input(s): DDIMER in the last 72 hours. -------------------------------------------------------------------------------------------------------------------  Cardiac Enzymes Recent Labs  Lab 03/20/17 0116  TROPONINI <0.03   ------------------------------------------------------------------------------------------------------------------ Invalid input(s): POCBNP  ---------------------------------------------------------------------------------------------------------------  Urinalysis    Component Value Date/Time   COLORURINE YELLOW (A) 06/29/2015 1712   APPEARANCEUR CLEAR (A) 06/29/2015 1712   APPEARANCEUR Cloudy 05/14/2012 1436   LABSPEC 1.020 06/29/2015 1712   LABSPEC 1.028 05/14/2012 1436   PHURINE 5.0 06/29/2015 1712   GLUCOSEU NEGATIVE 06/29/2015 1712   GLUCOSEU Negative 05/14/2012 1436   HGBUR NEGATIVE 06/29/2015 1712   BILIRUBINUR NEGATIVE 06/29/2015 1712   BILIRUBINUR Negative 05/14/2012 1436   KETONESUR NEGATIVE 06/29/2015 1712   PROTEINUR NEGATIVE 06/29/2015 1712   UROBILINOGEN 0.2 07/31/2013 2049   NITRITE NEGATIVE 06/29/2015 1712   LEUKOCYTESUR 1+ (A) 06/29/2015 1712   LEUKOCYTESUR 2+ 05/14/2012 1436     RADIOLOGY: Ct Abdomen Pelvis Wo Contrast  Result Date: 03/20/2017 CLINICAL DATA:  foreign body sensation EXAM: CT ABDOMEN AND PELVIS WITHOUT CONTRAST TECHNIQUE: Multidetector CT imaging of the abdomen and pelvis was performed following the standard protocol without IV contrast. COMPARISON:  06/29/2015 FINDINGS: Lower chest: Lung bases demonstrate no acute consolidation or pleural effusion. Mild cardiomegaly. Mitral calcifications. Hepatobiliary: Interim development of multiple hypodense liver masses. Question slight nodularity of the liver contour. Surgical absence of the gallbladder. No  biliary dilatation. Pancreas: Unremarkable. No pancreatic ductal dilatation or surrounding inflammatory changes. Spleen: Normal in size without focal abnormality. Adrenals/Urinary Tract: Adrenal glands are within normal limits. Staghorn calculus in the lower pole of the left kidney measuring up to 17 mm. No hydronephrosis. Bladder normal. Stomach/Bowel: Status post gastric bypass. Distended gastric pouch filled with fluid and food material which extends into the jejunal limb. No dilated small bowel distal to this is suggest obstruction. No colon wall thickening. Vascular/Lymphatic: Aortic atherosclerosis. Enlarged gastrohepatic lymph node measuring 14 mm. Additional prominent porta hepatis lymph nodes. Reproductive: Status post hysterectomy. No adnexal masses. Other: Negative for free air or free fluid. Musculoskeletal: Degenerative changes of the spine. Grade 1 anterolisthesis of L4 on L5. No acute or suspicious bone lesion is seen. Lucent lesion in the left femoral neck is unchanged. IMPRESSION: 1. Status post gastric bypass with distension of the gastric pouch by fluid and food bolus. There is some extension of the food bolus into  the jejunal limb. No dilated small bowel distal to this is suggest obstruction. 2. Interim development of multiple hypo dense liver masses, concerning for metastatic disease or liver primary. Follow-up contrast-enhanced CT and or MRI is recommended. 3. Enlarged gastrohepatic and porta hepatis nodes are concerning for metastatic disease. 4. Staghorn calculus on the left.  No hydronephrosis. Electronically Signed   By: Donavan Foil M.D.   On: 03/20/2017 03:59   Dg Neck Soft Tissue  Result Date: 03/20/2017 CLINICAL DATA:  Feels like food stuck in the throat. Shortness of breath. EXAM: NECK SOFT TISSUES - 1+ VIEW COMPARISON:  Cervical spine 09/24/2013 FINDINGS: There is no evidence of retropharyngeal soft tissue swelling or epiglottic enlargement. The cervical airway is unremarkable  and no radio-opaque foreign body identified. Postoperative changes with anterior and posterior fixation of the cervical spine. IMPRESSION: No radiopaque soft tissue foreign bodies are identified. Postoperative changes in the cervical spine. Electronically Signed   By: Lucienne Capers M.D.   On: 03/20/2017 02:34   Dg Chest 2 View  Result Date: 03/20/2017 CLINICAL DATA:  Feels like food stuck in the throat. Shortness of breath. EXAM: CHEST  2 VIEW COMPARISON:  07/10/2015 FINDINGS: Mild cardiac enlargement. No pulmonary vascular congestion or edema. Peribronchial thickening with central interstitial changes suggesting chronic bronchitis. Linear scarring in the left mid lung. No focal consolidation. No blunting of costophrenic angles. No pneumothorax. Mediastinal contours appear intact. Postoperative changes in the cervical spine. Degenerative changes in the thoracic spine. No apparent esophageal dilatation. IMPRESSION: Cardiac enlargement. Chronic bronchitic changes in the lungs. No evidence of active pulmonary disease. Electronically Signed   By: Lucienne Capers M.D.   On: 03/20/2017 02:35    EKG: Orders placed or performed during the hospital encounter of 03/20/17  . ED EKG  . ED EKG    IMPRESSION AND PLAN: 82 year old female patient with history of sleep apnea,, arthritis, hypertension, bronchial asthma presented to the emergency room with difficulty swallowing food and sensation of food being stuck in number she eats chicken Patient also complains of epigastric pain  Admitting diagnosis 1.  Dysphagia 2.  Liver masses 3  sleep apnea 4.  Hypertension 5. Abdominal pain 6.  Dehydration Treatment plan Admit patient to medical floor IV fluid hydration N.p.o. Gastroenterology consultation for endoscopy and further evaluation Oncology consultation for evaluation of liver masses CPAP at bedtime   All the records are reviewed and case discussed with ED provider. Management plans discussed  with the patient, family and they are in agreement.  CODE STATUS:FULL CODE Code Status History    Date Active Date Inactive Code Status Order ID Comments User Context   07/31/2013 18:54 08/26/2013 21:06 Full Code [id number]  Meredith Staggers, MD Inpatient   07/30/2013 19:45 07/31/2013 18:54 Full Code [id number]  Cathlyn Parsons, PA-C Inpatient   07/28/2013 18:11 07/30/2013 19:45 Full Code [id number]  Erline Levine, MD Inpatient   07/22/2013 15:06 07/28/2013 14:14 Full Code [id number]  Cathlyn Parsons, PA-C Inpatient   07/22/2013 15:06 07/22/2013 15:06 Full Code [id number]  Cathlyn Parsons, PA-C Inpatient   07/16/2013 05:45 07/22/2013 15:06 Full Code [id number]  Erline Levine, MD Inpatient       TOTAL TIME TAKING CARE OF THIS PATIENT: 50 minutes.    Saundra Shelling M.D on 03/20/2017 at 5:39 AM  Between 7am to 6pm - Pager - [phone]  After 6pm go to [email or web address] - password [email or web address] [facility]  Office  [phone]  CC: Primary care physician; Elsie Stain  Chauncey Cruel, MD

## 2017-03-20 NOTE — Progress Notes (Signed)
Pt offered [facility] CPAP for sleep but pt declined. Pt states that she can only wear her home machine and she doesn't have it here. She has no one to bring her machine so she has elected not to use CPAP for sleep. Pt encouraged to call should she change her mind.

## 2017-03-20 NOTE — ED Notes (Signed)
Ambulated pt to restroom on B side. When we returned, pt was 92% on room air with tachypnea. This RN placed pt on 2L [place] for support at this time. Will re-check once she has caught her breath to wean.

## 2017-03-20 NOTE — ED Notes (Signed)
Respiratory at bedside for ABG

## 2017-03-20 NOTE — Consult Note (Signed)
[name], MD [address], [place], [place], [place], [address] [address], [place], [place], [place], [address] Phone: [phone]  Fax: [phone]  Consultation  Referring Provider:     No ref. provider found Primary Care Physician:  Tonia Ghent, MD Primary Gastroenterologist:  Virgel Manifold, MD        Reason for Consultation:     Dysphagia  Date of Admission:  03/20/2017 Date of Consultation:  03/20/2017         HPI:   [name] is a 82 y.o. female with history of intermittent dysphagia to solids for the last 2-3 months and 20 pound weight loss in the last year and a half presents with symptoms of food impaction last night.  Patient states that she had a sandwich for dinner and she felt that it got stuck at the xiphoid notch.  She tried drinking some soda which did not help.  She was unable to tolerate her secretions and came to the ED.  She had a CT abdomen, and neck and chest x-ray did not report a specific food impaction however patient continues to feel the sensation until about 3 or 4 AM in the morning, when this sensation went away.  By this time she had received glucagon and antiemetics.  Since then she has been able to tolerate her secretions, drink liquids and was even able to eat toast with jelly on it at 930 this morning.  She has history of gastric bypass in 2004 and states at that time she also had hiatal hernia repair.  She said this was done in [place] and she has had no problems since the surgery.  Reports no weight loss 20 pounds over the last year and a half.  She is able to take oral liquids or soft foods without problems.  Her CT abdomen on admission also showed diffuse liver lesions.  She denies any changes in bowel movements, hematemesis, melena or hematochezia.  She takes daily NSAIDs for arthritis.  She states she has had a colonoscopy before in care everywhere a colonoscopy as mentioned in 2009 but the report is not  available.  Past Medical History:  Diagnosis Date  . Arthritis   . Asthma   . Dysphonia   . Hypertension   . Melanoma ([place])   . OAB (overactive bladder)   . OSA on CPAP   . 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 Pupil asymmetry    R pupil defect  . Sleep apnea   . Spinal cord abscess     Past Surgical History:  Procedure Laterality Date  . APPENDECTOMY    . CATARACT EXTRACTION    . CHOLECYSTECTOMY    . dental implant    . GASTRIC BYPASS    . HIATAL HERNIA REPAIR    . MELANOMA EXCISION    . TONSILLECTOMY    . TOTAL KNEE ARTHROPLASTY      Prior to Admission medications   Medication Sig Start Date End Date Taking? Authorizing Provider  acetaminophen (TYLENOL) 325 MG tablet Take 650 mg by mouth every 6 (six) hours as needed.   Yes [provider]  aspirin 81 MG tablet Take 81 mg by mouth every other day. Alternating with vitamin b-12   Yes [provider]  calcium carbonate (TUMS EX) 750 MG chewable tablet Chew 1 tablet by mouth daily.   Yes [provider]  Cholecalciferol (VITAMIN D) 2000 UNITS CAPS Take  2,000 Units by mouth daily.   Yes [provider]  cyanocobalamin 500 MCG tablet Take 500 mcg every other day by mouth.   Yes [provider]  fluticasone (FLONASE) 50 MCG/ACT nasal spray Place 1 spray into both nostrils 2 (two) times daily.    Yes [provider]  furosemide (LASIX) 20 MG tablet TAKE 1-2 TABLETS BY MOUTH DAILY IF NEEDED FOR EDEMA 01/06/17  Yes Tonia Ghent, MD  hydrochlorothiazide (HYDRODIURIL) 12.5 MG tablet TAKE 1 TABLET BY MOUTH DAILY 02/13/17  Yes Tonia Ghent, MD  lisinopril (PRINIVIL,ZESTRIL) 5 MG tablet Take 7.5 mg daily by mouth.   Yes [provider]  loratadine (CLARITIN) 10 MG tablet Take 10 mg by mouth daily.   Yes [provider]  LYRICA 150 MG capsule TAKE 1 CAPSULE BY MOUTH TWO TIMES DAILY 01/06/17  Yes Tonia Ghent, MD  meloxicam (MOBIC) 7.5 MG  tablet Take 7.5 mg by mouth daily as needed.   Yes [provider]  metoprolol succinate (TOPROL-XL) 50 MG 24 hr tablet TAKE 1 TABLET BY MOUTH DAILY 01/14/17  Yes Tonia Ghent, MD  naproxen sodium (ANAPROX) 220 MG tablet Take 220 mg daily as needed by mouth.    Yes [provider]  oxybutynin (DITROPAN) 5 MG tablet TAKE 1 TABLET BY MOUTH IN THE MORNING AND 1/2 TABLET AT BEDTIME 01/06/17  Yes Tonia Ghent, MD  pregabalin (LYRICA) 75 MG capsule Take 75 mg daily by mouth.   Yes [provider]  traMADol (ULTRAM) 50 MG tablet Take 0.5 tablet (25 mg total) in the morning and 0.5 tablet ( 25 mg total)at noon as needed. 11/11/13  Yes [provider]  traZODone (DESYREL) 50 MG tablet Take 25 mg at bedtime as needed by mouth for sleep. Take 0.5 tablet (25 mg total) at bedtime 06/30/13  Yes [provider]  lisinopril (PRINIVIL,ZESTRIL) 5 MG tablet TAKE 1 TABLET BY MOUTH TWICE DAILY Patient not taking: Reported on 03/20/2017 02/13/17   Tonia Ghent, MD    Family History  Problem Relation Age of Onset  . Breast cancer Mother 76  . Breast cancer Sister 8  . Colon cancer Neg Hx      Social History   Tobacco Use  . Smoking status: Former Research scientist (life sciences)  . Smokeless tobacco: Never Used  Substance Use Topics  . Alcohol use: No  . Drug use: No    Allergies as of 03/19/2017 - Review Complete 01/15/2017  Allergen Reaction Noted  . Codeine Nausea Only 01/22/2011  . Doxycycline Other (See Comments) 12/03/2013  . Gabapentin Other (See Comments) 12/19/2016  . Iohexol Itching 07/15/2013  . Other Other (See Comments) 12/03/2013  . Oxycodone Other (See Comments) 12/19/2016  . Quinolones Swelling 12/03/2013  . Synvisc [hylan g-f 20]  01/22/2011    Review of Systems:    All systems reviewed and negative except where noted in HPI.   Physical Exam:  Vital signs in last 24 hours: Temp:  [98 F (36.7 C)-98.4 F (36.9 C)] 98.1 F (36.7 C) (11/08 1009) Pulse  Rate:  [71-92] 76 (11/08 1009) Resp:  [18-21] 18 (11/08 1009) BP: (107-176)/(40-103) 125/43 (11/08 1009) SpO2:  [84 %-99 %] 98 % (11/08 1009) Weight:  [106.6 kg (235 lb)] 106.6 kg (235 lb) (11/08 0004)   General:   Pleasant, cooperative in NAD Head:  Normocephalic and atraumatic. Eyes:   No icterus.   Conjunctiva pink. PERRLA. Ears:  Normal auditory acuity. Neck:  Supple; no  masses or thyroidomegaly Lungs: Respirations even and unlabored. Lungs clear to auscultation bilaterally.   No wheezes, crackles, or rhonchi.  Heart:  Regular rate and rhythm;  Without murmur, clicks, rubs or gallops.  No edema Abdomen:  Soft, nondistended, nontender. Normal bowel sounds. No appreciable masses or hepatomegaly.  No rebound or guarding.  Neurologic:  Alert and oriented x3;  grossly normal neurologically. Skin:  Intact without significant lesions or rashes. Cervical Nodes:  No significant cervical adenopathy. Psych:  Alert and cooperative. Normal affect.  LAB RESULTS: Recent Labs    03/20/17 0116  WBC 10.7  HGB 11.2*  HCT 33.5*  PLT 212   BMET Recent Labs    03/20/17 0116  NA 142  K 4.0  CL 103  CO2 28  GLUCOSE 169*  BUN 36*  CREATININE 0.83  CALCIUM 8.8*   LFT Recent Labs    03/20/17 0116  PROT 7.0  ALBUMIN 3.6  AST 21  ALT 13*  ALKPHOS 66  BILITOT 0.5   PT/INR No results for input(s): LABPROT, INR in the last 72 hours.  STUDIES: Ct Abdomen Pelvis Wo Contrast  Result Date: 03/20/2017 CLINICAL DATA:  foreign body sensation EXAM: CT ABDOMEN AND PELVIS WITHOUT CONTRAST TECHNIQUE: Multidetector CT imaging of the abdomen and pelvis was performed following the standard protocol without IV contrast. COMPARISON:  06/29/2015 FINDINGS: Lower chest: Lung bases demonstrate no acute consolidation or pleural effusion. Mild cardiomegaly. Mitral calcifications. Hepatobiliary: Interim development of multiple hypodense liver masses. Question slight nodularity of the liver contour. Surgical  absence of the gallbladder. No biliary dilatation. Pancreas: Unremarkable. No pancreatic ductal dilatation or surrounding inflammatory changes. Spleen: Normal in size without focal abnormality. Adrenals/Urinary Tract: Adrenal glands are within normal limits. Staghorn calculus in the lower pole of the left kidney measuring up to 17 mm. No hydronephrosis. Bladder normal. Stomach/Bowel: Status post gastric bypass. Distended gastric pouch filled with fluid and food material which extends into the jejunal limb. No dilated small bowel distal to this is suggest obstruction. No colon wall thickening. Vascular/Lymphatic: Aortic atherosclerosis. Enlarged gastrohepatic lymph node measuring 14 mm. Additional prominent porta hepatis lymph nodes. Reproductive: Status post hysterectomy. No adnexal masses. Other: Negative for free air or free fluid. Musculoskeletal: Degenerative changes of the spine. Grade 1 anterolisthesis of L4 on L5. No acute or suspicious bone lesion is seen. Lucent lesion in the left femoral neck is unchanged. IMPRESSION: 1. Status post gastric bypass with distension of the gastric pouch by fluid and food bolus. There is some extension of the food bolus into the jejunal limb. No dilated small bowel distal to this is suggest obstruction. 2. Interim development of multiple hypo dense liver masses, concerning for metastatic disease or liver primary. Follow-up contrast-enhanced CT and or MRI is recommended. 3. Enlarged gastrohepatic and porta hepatis nodes are concerning for metastatic disease. 4. Staghorn calculus on the left.  No hydronephrosis. Electronically Signed   By: Donavan Foil M.D.   On: 03/20/2017 03:59   Dg Neck Soft Tissue  Result Date: 03/20/2017 CLINICAL DATA:  Feels like food stuck in the throat. Shortness of breath. EXAM: NECK SOFT TISSUES - 1+ VIEW COMPARISON:  Cervical spine 09/24/2013 FINDINGS: There is no evidence of retropharyngeal soft tissue swelling or epiglottic enlargement. The  cervical airway is unremarkable and no radio-opaque foreign body identified. Postoperative changes with anterior and posterior fixation of the cervical spine. IMPRESSION: No radiopaque soft tissue foreign bodies are identified. Postoperative changes in the cervical spine. Electronically Signed   By: Gwyndolyn Saxon  Gerilyn Nestle M.D.   On: 03/20/2017 02:34   Dg Chest 2 View  Result Date: 03/20/2017 CLINICAL DATA:  Feels like food stuck in the throat. Shortness of breath. EXAM: CHEST  2 VIEW COMPARISON:  07/10/2015 FINDINGS: Mild cardiac enlargement. No pulmonary vascular congestion or edema. Peribronchial thickening with central interstitial changes suggesting chronic bronchitis. Linear scarring in the left mid lung. No focal consolidation. No blunting of costophrenic angles. No pneumothorax. Mediastinal contours appear intact. Postoperative changes in the cervical spine. Degenerative changes in the thoracic spine. No apparent esophageal dilatation. IMPRESSION: Cardiac enlargement. Chronic bronchitic changes in the lungs. No evidence of active pulmonary disease. Electronically Signed   By: Lucienne Capers M.D.   On: 03/20/2017 02:35      Impression / Plan:   [name] is a 82 y.o. y/o female with presentation for food impaction yesterday that self resolved but patient has had a history of intermittent dysphagia for the last 2 months with 20 pound unintentional weight loss over the last year and a half and CT showing multiple liver lesions with previous history of gastric bypass in 2004  Patient's dysphagia  needs further evaluation with EGD.  However this is unable to be done today as patient had solid food this morning.  In the absence of any clinical signs of food impaction no  Indications for emergent endoscopy at this time.  If this changes or patient develops inability to tolerate secretions or liquids or food please page GI on call for evaluation of emergent EGD at that time or for any other  indication for emergent EGD.  Differential includes esophageal cancer  given her weight loss and liver findings, esophageal stricture or rings, esophagitis.  Agree with oncology consult which is now pending given her new lesions  Patient's elective EGD can be scheduled as an inpatient or outpatient depending on schedule availability.  If she is discharged we can schedule this as an outpatient in 1-2 weeks.  Can consider inpatient procedure tomorrow if schedule allows if patient is still here.  While inpatient patient should be placed on a dysphagia diet and at discharge be given instructions for the same at home.  Please make patient n.p.o. past midnight today if she is not discharged today for possibility of procedure tomorrow. Patient was asked to avoid solid food intake to soft foods only to avoid future food impactions.  She was asked to follow diet with lots of liquids.  She was asked to moisten her food with gravy or sauce.  She was asked to avoid breads or meats as does give her the most trouble.  She was asked to eat in small bites and chew her food really well.  Patient should also take her pills one at a time and follow it with plenty of liquid.  Patient is on multiple NSAIDs and that should be limited as well.  Thank you for involving me in the care of this patient.      LOS: 0 days   Virgel Manifold, MD  03/20/2017, 12:18 PM

## 2017-03-20 NOTE — ED Provider Notes (Signed)
[facility] Emergency Department Provider Note   ____________________________________________   First MD Initiated Contact with Patient 03/20/17 0023     (approximate)  I have reviewed the triage vital signs and the nursing notes.   HISTORY  Chief Complaint No chief complaint on file.     HPI [name] is a 82 y.o. female who presents to the ED from home with a chief complaint of upper abdominal pain and food bolus sensation.  Patient has a history of Roux-en-Y gastric bypass in 2004 with hernia repair; states over the past month she has been feeling more more that her food is harder to pass.  Ate a grilled cheese sandwich last evening approximately 8 PM and feels like food is stuck (points to her epigastrium).  Attempted to induce vomiting and drink fluids and now is experiencing irritation in her throat.  Has been able to keep some liquids down.  Try to drink a Coke but partially vomited.  Denies recent fever, chills, chest pain, shortness of breath, diarrhea.  Denies recent travel or trauma.  No prior personal history of esophageal stenosis requiring dilatation.   Past Medical History:  Diagnosis Date  . Arthritis   . Asthma   . Dysphonia   . Hypertension   . Melanoma ([place])   . OAB (overactive bladder)   . OSA on CPAP   . 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 Pupil asymmetry    R pupil defect  . Sleep apnea   . Spinal cord abscess     Patient Active Problem List   Diagnosis Date Noted  . Insomnia 12/19/2016  . Advance care planning 12/19/2016  . Asthma without status asthmaticus 12/03/2013  . Borderline diabetes 12/03/2013  . HTN (hypertension) 12/03/2013  . Arthritis, degenerative 12/03/2013  . 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 Restless leg 12/03/2013  . C4 spinal cord injury ([place]) 12/03/2013  . Dysphonia 11/24/2013  . Neuropathy 10/22/2013  . OSA on CPAP 07/16/2013    Past Surgical History:    Procedure Laterality Date  . APPENDECTOMY    . CATARACT EXTRACTION    . CHOLECYSTECTOMY    . dental implant    . GASTRIC BYPASS    . HIATAL HERNIA REPAIR    . MELANOMA EXCISION    . TONSILLECTOMY    . TOTAL KNEE ARTHROPLASTY      Prior to Admission medications   Medication Sig Start Date End Date Taking? Authorizing Provider  acetaminophen (TYLENOL) 325 MG tablet Take 650 mg by mouth every 6 (six) hours as needed.   Yes [provider]  aspirin 81 MG tablet Take 81 mg by mouth every other day. Alternating with vitamin b-12   Yes [provider]  calcium carbonate (TUMS EX) 750 MG chewable tablet Chew 1 tablet by mouth daily.   Yes [provider]  Cholecalciferol (VITAMIN D) 2000 UNITS CAPS Take 2,000 Units by mouth daily.   Yes [provider]  cyanocobalamin 500 MCG tablet Take 500 mcg every other day by mouth.   Yes [provider]  fluticasone (FLONASE) 50 MCG/ACT nasal spray Place 1 spray into both nostrils 2 (two) times daily.    Yes [provider]  furosemide (LASIX) 20 MG tablet TAKE 1-2 TABLETS BY MOUTH DAILY IF NEEDED FOR EDEMA 01/06/17  Yes Tonia Ghent, MD  hydrochlorothiazide (HYDRODIURIL) 12.5 MG tablet TAKE 1 TABLET BY MOUTH DAILY 02/13/17  Yes Tonia Ghent,  MD  lisinopril (PRINIVIL,ZESTRIL) 5 MG tablet Take 7.5 mg daily by mouth.   Yes [provider]  loratadine (CLARITIN) 10 MG tablet Take 10 mg by mouth daily.   Yes [provider]  LYRICA 150 MG capsule TAKE 1 CAPSULE BY MOUTH TWO TIMES DAILY 01/06/17  Yes Tonia Ghent, MD  meloxicam (MOBIC) 7.5 MG tablet Take 7.5 mg by mouth daily as needed.   Yes [provider]  metoprolol succinate (TOPROL-XL) 50 MG 24 hr tablet TAKE 1 TABLET BY MOUTH DAILY 01/14/17  Yes Tonia Ghent, MD  naproxen sodium (ANAPROX) 220 MG tablet Take 220 mg daily as needed by mouth.    Yes [provider]  oxybutynin (DITROPAN) 5 MG tablet TAKE 1  TABLET BY MOUTH IN THE MORNING AND 1/2 TABLET AT BEDTIME 01/06/17  Yes Tonia Ghent, MD  pregabalin (LYRICA) 75 MG capsule Take 75 mg daily by mouth.   Yes [provider]  traMADol (ULTRAM) 50 MG tablet Take 0.5 tablet (25 mg total) in the morning and 0.5 tablet ( 25 mg total)at noon as needed. 11/11/13  Yes [provider]  traZODone (DESYREL) 50 MG tablet Take 25 mg at bedtime as needed by mouth for sleep. Take 0.5 tablet (25 mg total) at bedtime 06/30/13  Yes [provider]  lisinopril (PRINIVIL,ZESTRIL) 5 MG tablet TAKE 1 TABLET BY MOUTH TWICE DAILY Patient not taking: Reported on 03/20/2017 02/13/17   Tonia Ghent, MD    Allergies Codeine; Doxycycline; Gabapentin; Iohexol; Other; Oxycodone; Quinolones; and Synvisc [hylan g-f 20]  Family History  Problem Relation Age of Onset  . Breast cancer Mother 50  . Breast cancer Sister 68  . Colon cancer Neg Hx     Social History Social History   Tobacco Use  . Smoking status: Former Research scientist (life sciences)  . Smokeless tobacco: Never Used  Substance Use Topics  . Alcohol use: No  . Drug use: No    Review of Systems  Constitutional: No fever/chills. Eyes: No visual changes. ENT: No sore throat. Cardiovascular: Denies chest pain. Respiratory: Denies shortness of breath. Gastrointestinal: Positive for abdominal pain.  Positive for self-induced vomiting.  No diarrhea.  No constipation. Genitourinary: Negative for dysuria. Musculoskeletal: Negative for back pain. Skin: Negative for rash. Neurological: Negative for headaches, focal weakness or numbness.   ____________________________________________   PHYSICAL EXAM:  VITAL SIGNS: ED Triage Vitals  Enc Vitals Group     BP 03/20/17 0006 (!) 161/75     Pulse Rate 03/20/17 0006 74     Resp 03/20/17 0006 20     Temp 03/20/17 0006 98 F (36.7 C)     Temp Source 03/20/17 0006 Oral     SpO2 03/20/17 0006 97 %     Weight 03/20/17 0004 235 lb (106.6 kg)     Height  03/20/17 0004 5\' 3"  (1.6 m)     Head Circumference --      Peak Flow --      Pain Score 03/20/17 0004 10     Pain Loc --      Pain Edu? --      Excl. in [place]? --     Constitutional: Alert and oriented. Well appearing and in mild acute distress. Eyes: Conjunctivae are normal. PERRL. EOMI. Head: Atraumatic. Nose: No congestion/rhinnorhea. Mouth/Throat: Mucous membranes are moist.  No foreign body visible in oropharynx. Neck: No stridor.  No mass or hematoma. Cardiovascular: Normal rate, regular rhythm. Grossly normal heart sounds.  Good  peripheral circulation. Respiratory: Normal respiratory effort.  No retractions. Lungs diminished bilaterally. Gastrointestinal: Soft and mildly tender to palpation epigastrium without rebound or guarding. No distention. No abdominal bruits. No CVA tenderness. Musculoskeletal: No lower extremity tenderness nor edema.  No joint effusions. Neurologic:  Normal speech and language. No gross focal neurologic deficits are appreciated.  Skin:  Skin is warm, dry and intact. No rash noted. Psychiatric: Mood and affect are normal. Speech and behavior are normal.  ____________________________________________   LABS (all labs ordered are listed, but only abnormal results are displayed)  Labs Reviewed  CBC WITH DIFFERENTIAL/PLATELET - Abnormal; Notable for the following components:      Result Value   RBC 3.64 (*)    Hemoglobin 11.2 (*)    HCT 33.5 (*)    Neutro Abs 9.2 (*)    All other components within normal limits  COMPREHENSIVE METABOLIC PANEL - Abnormal; Notable for the following components:   Glucose, Bld 169 (*)    BUN 36 (*)    Calcium 8.8 (*)    ALT 13 (*)    All other components within normal limits  LIPASE, BLOOD - Abnormal; Notable for the following components:   Lipase 71 (*)    All other components within normal limits  BLOOD GAS, ARTERIAL - Abnormal; Notable for the following components:   pO2, Arterial 73 (*)    Bicarbonate 29.1 (*)     Acid-Base Excess 3.6 (*)    All other components within normal limits  TROPONIN I  BRAIN NATRIURETIC PEPTIDE   ____________________________________________  EKG  ED ECG REPORT I, Teaghan Formica J, the attending physician, personally viewed and interpreted this ECG.   Date: 03/20/2017  EKG Time: 0258  Rate: 78  Rhythm: normal EKG, normal sinus rhythm  Axis: Normal  Intervals:none  ST&T Change: Nonspecific  ____________________________________________  RADIOLOGY  Ct Abdomen Pelvis Wo Contrast  Result Date: 03/20/2017 CLINICAL DATA:  foreign body sensation EXAM: CT ABDOMEN AND PELVIS WITHOUT CONTRAST TECHNIQUE: Multidetector CT imaging of the abdomen and pelvis was performed following the standard protocol without IV contrast. COMPARISON:  06/29/2015 FINDINGS: Lower chest: Lung bases demonstrate no acute consolidation or pleural effusion. Mild cardiomegaly. Mitral calcifications. Hepatobiliary: Interim development of multiple hypodense liver masses. Question slight nodularity of the liver contour. Surgical absence of the gallbladder. No biliary dilatation. Pancreas: Unremarkable. No pancreatic ductal dilatation or surrounding inflammatory changes. Spleen: Normal in size without focal abnormality. Adrenals/Urinary Tract: Adrenal glands are within normal limits. Staghorn calculus in the lower pole of the left kidney measuring up to 17 mm. No hydronephrosis. Bladder normal. Stomach/Bowel: Status post gastric bypass. Distended gastric pouch filled with fluid and food material which extends into the jejunal limb. No dilated small bowel distal to this is suggest obstruction. No colon wall thickening. Vascular/Lymphatic: Aortic atherosclerosis. Enlarged gastrohepatic lymph node measuring 14 mm. Additional prominent porta hepatis lymph nodes. Reproductive: Status post hysterectomy. No adnexal masses. Other: Negative for free air or free fluid. Musculoskeletal: Degenerative changes of the spine. Grade 1  anterolisthesis of L4 on L5. No acute or suspicious bone lesion is seen. Lucent lesion in the left femoral neck is unchanged. IMPRESSION: 1. Status post gastric bypass with distension of the gastric pouch by fluid and food bolus. There is some extension of the food bolus into the jejunal limb. No dilated small bowel distal to this is suggest obstruction. 2. Interim development of multiple hypo dense liver masses, concerning for metastatic disease or liver primary. Follow-up contrast-enhanced CT and or MRI  is recommended. 3. Enlarged gastrohepatic and porta hepatis nodes are concerning for metastatic disease. 4. Staghorn calculus on the left.  No hydronephrosis. Electronically Signed   By: Donavan Foil M.D.   On: 03/20/2017 03:59   Dg Neck Soft Tissue  Result Date: 03/20/2017 CLINICAL DATA:  Feels like food stuck in the throat. Shortness of breath. EXAM: NECK SOFT TISSUES - 1+ VIEW COMPARISON:  Cervical spine 09/24/2013 FINDINGS: There is no evidence of retropharyngeal soft tissue swelling or epiglottic enlargement. The cervical airway is unremarkable and no radio-opaque foreign body identified. Postoperative changes with anterior and posterior fixation of the cervical spine. IMPRESSION: No radiopaque soft tissue foreign bodies are identified. Postoperative changes in the cervical spine. Electronically Signed   By: Lucienne Capers M.D.   On: 03/20/2017 02:34   Dg Chest 2 View  Result Date: 03/20/2017 CLINICAL DATA:  Feels like food stuck in the throat. Shortness of breath. EXAM: CHEST  2 VIEW COMPARISON:  07/10/2015 FINDINGS: Mild cardiac enlargement. No pulmonary vascular congestion or edema. Peribronchial thickening with central interstitial changes suggesting chronic bronchitis. Linear scarring in the left mid lung. No focal consolidation. No blunting of costophrenic angles. No pneumothorax. Mediastinal contours appear intact. Postoperative changes in the cervical spine. Degenerative changes in the  thoracic spine. No apparent esophageal dilatation. IMPRESSION: Cardiac enlargement. Chronic bronchitic changes in the lungs. No evidence of active pulmonary disease. Electronically Signed   By: Lucienne Capers M.D.   On: 03/20/2017 02:35    ____________________________________________   PROCEDURES  Procedure(s) performed: None  Procedures  Critical Care performed: No  ____________________________________________   INITIAL IMPRESSION / ASSESSMENT AND PLAN / ED COURSE  As part of my medical decision making, I reviewed the following data within the electronic MEDICAL RECORD NUMBER History obtained from family, Nursing notes reviewed and incorporated, Labs reviewed, EKG interpreted, Radiograph reviewed, Discussed with admitting physician Dr. Marcille Blanco and Notes from prior ED visits.   82 year old female with history of Roux-en-Y gastric bypass over 14 years ago who presents with foreign body sensation in her epigastrium.  Differential diagnosis includes, but is not limited to, biliary disease (biliary colic, acute cholecystitis, cholangitis, choledocholithiasis, etc), intrathoracic causes for epigastric abdominal pain including ACS, gastritis, duodenitis, pancreatitis, small bowel or large bowel obstruction, abdominal aortic aneurysm, hernia, and gastritis.  She is tolerating secretions well.  Occasionally spitting up foamy sputum.  Noted to be mildly hypoxic after walking around in the lobby and placed on oxygen.  Does not present with traditional esophageal food bolus symptoms requiring endoscopy.  Will administer glucagon with Zofran and reassess.  Will check screening lab work including lipase and troponin.  Clinical Course as of Mar 20 444  Thu Mar 20, 2017  0417 Patient resting more comfortably after fentanyl.  Updated patient and son of CT imaging results.  Lungs diminished bibasilarly.  Will administer nebulizer treatment.  Room air saturations 90%.  Will discuss with hospitalist to  evaluate patient in the emergency department for admission.  [JS]    Clinical Course User Index [JS] Paulette Blanch, MD     ____________________________________________   FINAL CLINICAL IMPRESSION(S) / ED DIAGNOSES  Final diagnoses:  Epigastric pain  Hypoxia  Moderate asthma, unspecified whether complicated, unspecified whether persistent  Acute pancreatitis, unspecified complication status, unspecified pancreatitis type  Globus sensation     ED Discharge Orders    None       Note:  This document was prepared using Dragon voice recognition software and may include unintentional dictation  errors.    Paulette Blanch, MD 03/20/17 [phone]

## 2017-03-20 NOTE — ED Notes (Signed)
Patient transported to XR. 

## 2017-03-20 NOTE — Progress Notes (Addendum)
Hypoxia workup with V/Q scan and echo is pending. Will plan for potential EGD tomorrow pending results of v/Q scan and echo.

## 2017-03-21 ENCOUNTER — Other Ambulatory Visit: Payer: Medicare Other

## 2017-03-21 ENCOUNTER — Inpatient Hospital Stay
Admit: 2017-03-21 | Discharge: 2017-03-21 | Disposition: A | Discharge status: Home / Self Care | Payer: Medicare Other | Attending: Internal Medicine | Admitting: Internal Medicine

## 2017-03-21 ENCOUNTER — Inpatient Hospital Stay: Payer: Medicare Other | Admitting: Anesthesiology

## 2017-03-21 ENCOUNTER — Encounter
Admission: EM | Disposition: A | Discharge status: Home / Self Care | Payer: Self-pay | Source: Home / Self Care | Attending: Internal Medicine

## 2017-03-21 DIAGNOSIS — Z9989 Dependence on other enabling machines and devices: Secondary | ICD-10-CM

## 2017-03-21 DIAGNOSIS — Z79899 Other long term (current) drug therapy: Secondary | ICD-10-CM

## 2017-03-21 DIAGNOSIS — I10 Essential (primary) hypertension: Secondary | ICD-10-CM

## 2017-03-21 DIAGNOSIS — K769 Liver disease, unspecified: Secondary | ICD-10-CM

## 2017-03-21 DIAGNOSIS — Z85820 Personal history of malignant melanoma of skin: Secondary | ICD-10-CM

## 2017-03-21 DIAGNOSIS — R1310 Dysphagia, unspecified: Secondary | ICD-10-CM

## 2017-03-21 DIAGNOSIS — Z9884 Bariatric surgery status: Secondary | ICD-10-CM

## 2017-03-21 DIAGNOSIS — R634 Abnormal weight loss: Secondary | ICD-10-CM

## 2017-03-21 DIAGNOSIS — Z87891 Personal history of nicotine dependence: Secondary | ICD-10-CM

## 2017-03-21 DIAGNOSIS — D509 Iron deficiency anemia, unspecified: Secondary | ICD-10-CM

## 2017-03-21 DIAGNOSIS — G4733 Obstructive sleep apnea (adult) (pediatric): Secondary | ICD-10-CM

## 2017-03-21 DIAGNOSIS — R238 Other skin changes: Secondary | ICD-10-CM

## 2017-03-21 DIAGNOSIS — R1013 Epigastric pain: Secondary | ICD-10-CM

## 2017-03-21 DIAGNOSIS — D649 Anemia, unspecified: Secondary | ICD-10-CM

## 2017-03-21 DIAGNOSIS — M1990 Unspecified osteoarthritis, unspecified site: Secondary | ICD-10-CM

## 2017-03-21 DIAGNOSIS — D490 Neoplasm of unspecified behavior of digestive system: Secondary | ICD-10-CM

## 2017-03-21 HISTORY — PX: ESOPHAGOGASTRODUODENOSCOPY: SHX5428

## 2017-03-21 LAB — BASIC METABOLIC PANEL
ANION GAP: 6 (ref 5–15)
BUN: 24 mg/dL — ABNORMAL HIGH (ref 6–20)
CALCIUM: 7.5 mg/dL — AB (ref 8.9–10.3)
CO2: 26 mmol/L (ref 22–32)
CREATININE: 0.55 mg/dL (ref 0.44–1.00)
Chloride: 112 mmol/L — ABNORMAL HIGH (ref 101–111)
Glucose, Bld: 88 mg/dL (ref 65–99)
Potassium: 3.6 mmol/L (ref 3.5–5.1)
Sodium: 144 mmol/L (ref 135–145)

## 2017-03-21 LAB — CBC
HEMATOCRIT: 24.8 % — AB (ref 35.0–47.0)
Hemoglobin: 8.1 g/dL — ABNORMAL LOW (ref 12.0–16.0)
MCH: 30.7 pg (ref 26.0–34.0)
MCHC: 32.5 g/dL (ref 32.0–36.0)
MCV: 94.3 fL (ref 80.0–100.0)
Platelets: 163 10*3/uL (ref 150–440)
RBC: 2.63 MIL/uL — AB (ref 3.80–5.20)
RDW: 13.6 % (ref 11.5–14.5)
WBC: 7.7 10*3/uL (ref 3.6–11.0)

## 2017-03-21 LAB — RETICULOCYTES
RBC.: 3.14 MIL/uL — ABNORMAL LOW (ref 3.80–5.20)
Retic Count, Absolute: 97.3 10*3/uL (ref 19.0–183.0)
Retic Ct Pct: 3.1 % (ref 0.4–3.1)

## 2017-03-21 LAB — PROTIME-INR
INR: 0.98
Prothrombin Time: 12.9 seconds (ref 11.4–15.2)

## 2017-03-21 LAB — FERRITIN: FERRITIN: 20 ng/mL (ref 11–307)

## 2017-03-21 LAB — IRON AND TIBC
IRON: 35 ug/dL (ref 28–170)
Saturation Ratios: 11 % (ref 10.4–31.8)
TIBC: 328 ug/dL (ref 250–450)
UIBC: 293 ug/dL

## 2017-03-21 LAB — ECHOCARDIOGRAM COMPLETE
HEIGHTINCHES: 63 in
WEIGHTICAEL: 3760 [oz_av]

## 2017-03-21 LAB — VITAMIN B12: Vitamin B-12: 636 pg/mL (ref 180–914)

## 2017-03-21 LAB — FOLATE: Folate: 24 ng/mL (ref >5.9)

## 2017-03-21 SURGERY — EGD (ESOPHAGOGASTRODUODENOSCOPY)
Anesthesia: General | Laterality: Left

## 2017-03-21 MED ORDER — FENTANYL CITRATE (PF) 100 MCG/2ML IJ SOLN
INTRAMUSCULAR | Status: AC
  Filled 2017-03-21: qty 2

## 2017-03-21 MED ORDER — FENTANYL CITRATE (PF) 100 MCG/2ML IJ SOLN
INTRAMUSCULAR | Status: DC | PRN
  Administered 2017-03-21: 25 ug via INTRAVENOUS
  Administered 2017-03-21: 50 ug via INTRAVENOUS
  Administered 2017-03-21: 25 ug via INTRAVENOUS

## 2017-03-21 MED ORDER — EPHEDRINE SULFATE 50 MG/ML IJ SOLN
INTRAMUSCULAR | Status: AC
  Filled 2017-03-21: qty 1

## 2017-03-21 MED ORDER — PANTOPRAZOLE SODIUM 40 MG IV SOLR
40.0000 mg | Freq: Two times a day (BID) | INTRAVENOUS | Status: DC
  Administered 2017-03-21 – 2017-03-22 (×2): 40 mg via INTRAVENOUS
  Filled 2017-03-21 (×2): qty 40

## 2017-03-21 MED ORDER — LIDOCAINE HCL (PF) 2 % IJ SOLN
INTRAMUSCULAR | Status: AC
  Filled 2017-03-21: qty 10

## 2017-03-21 MED ORDER — VITAMIN B-12 1000 MCG PO TABS
500.0000 ug | ORAL_TABLET | ORAL | Status: DC
  Administered 2017-03-22: 10:00:00 500 ug via ORAL
  Filled 2017-03-21: qty 1

## 2017-03-21 MED ORDER — PROPOFOL 500 MG/50ML IV EMUL
INTRAVENOUS | Status: AC
  Filled 2017-03-21: qty 50

## 2017-03-21 MED ORDER — MORPHINE SULFATE (PF) 2 MG/ML IV SOLN
1.0000 mg | INTRAVENOUS | Status: DC | PRN
  Administered 2017-03-21: 1 mg via INTRAVENOUS
  Filled 2017-03-21: qty 1

## 2017-03-21 MED ORDER — LIDOCAINE HCL (CARDIAC) 20 MG/ML IV SOLN
INTRAVENOUS | Status: DC | PRN
  Administered 2017-03-21: 30 mg via INTRAVENOUS

## 2017-03-21 MED ORDER — BUTAMBEN-TETRACAINE-BENZOCAINE 2-2-14 % EX AERO
INHALATION_SPRAY | CUTANEOUS | Status: DC | PRN
  Administered 2017-03-21: 2 via TOPICAL

## 2017-03-21 MED ORDER — EPHEDRINE SULFATE 50 MG/ML IJ SOLN
INTRAMUSCULAR | Status: DC | PRN
  Administered 2017-03-21 (×3): 5 mg via INTRAVENOUS

## 2017-03-21 MED ORDER — PROPOFOL 500 MG/50ML IV EMUL
INTRAVENOUS | Status: DC | PRN
  Administered 2017-03-21: 100 ug/kg/min via INTRAVENOUS

## 2017-03-21 NOTE — Anesthesia Preprocedure Evaluation (Signed)
Anesthesia Evaluation  Patient identified by MRN, date of birth, ID band Patient awake    Reviewed: Allergy & Precautions, NPO status , Patient's Chart, lab work & pertinent test results  History of Anesthesia Complications Negative for: history of anesthetic complications  Airway Mallampati: II       Dental   Pulmonary asthma , sleep apnea and Continuous Positive Airway Pressure Ventilation , former smoker,           Cardiovascular hypertension, Pt. on medications (-) Past MI and (-) CHF (-) dysrhythmias (-) Valvular Problems/Murmurs     Neuro/Psych    GI/Hepatic Neg liver ROS, neg GERD  ,  Endo/Other  neg diabetesMorbid obesity  Renal/GU negative Renal ROS     Musculoskeletal   Abdominal   Peds  Hematology   Anesthesia Other Findings   Reproductive/Obstetrics                             Anesthesia Physical Anesthesia Plan  ASA: III  Anesthesia Plan: General   Post-op Pain Management:    Induction: Intravenous  PONV Risk Score and Plan:   Airway Management Planned: Nasal Cannula  Additional Equipment:   Intra-op Plan:   Post-operative Plan:   Informed Consent: I have reviewed the patients History and Physical, chart, labs and discussed the procedure including the risks, benefits and alternatives for the proposed anesthesia with the patient or authorized representative who has indicated his/her understanding and acceptance.     Plan Discussed with:   Anesthesia Plan Comments:         Anesthesia Quick Evaluation

## 2017-03-21 NOTE — Progress Notes (Signed)
[place] at [facility]                                                                                                                                                                                  [name], is a 82 y.o. female, DOB - 1934-06-19, RXV:[id number]  Admit date - 03/20/2017   Admitting Physician Saundra Shelling, MD  Outpatient Primary MD for the patient is Tonia Ghent, MD   LOS - 1  Subjective: Patient n.p.o. for EGD currently denies any symptoms  Review of Systems:   CONSTITUTIONAL: No documented fever. No fatigue, weakness. No weight gain, no weight loss.  EYES: No blurry or double vision.  ENT: No tinnitus. No postnasal drip. No redness of the oropharynx.  RESPIRATORY: No cough, no wheeze, no hemoptysis. No dyspnea.  CARDIOVASCULAR: No chest pain. No orthopnea. No palpitations. No syncope.  GASTROINTESTINAL: No nausea, no vomiting or diarrhea.  No abdominal pain. No melena or hematochezia.  GENITOURINARY: No dysuria or hematuria.  ENDOCRINE: No polyuria or nocturia. No heat or cold intolerance.  HEMATOLOGY: No anemia. No bruising. No bleeding.  INTEGUMENTARY: No rashes. No lesions.  MUSCULOSKELETAL: No arthritis. No swelling. No gout.  NEUROLOGIC: No numbness, tingling, or ataxia. No seizure-type activity.  PSYCHIATRIC: No anxiety. No insomnia. No ADD.    Vitals:   Vitals:   03/20/17 2031 03/21/17 0413 03/21/17 0823 03/21/17 1252  BP: (!) 105/47 (!) 101/46 (!) 116/51 (!) 105/40  Pulse: 70 71 79 72  Resp:  16  18  Temp: 98.4 F (36.9 C) 97.7 F (36.5 C) 98.8 F (37.1 C) 99.8 F (37.7 C)  TempSrc: Oral Oral Oral Tympanic  SpO2: 96% 92% 96% 94%  Weight:      Height:        Wt Readings from Last 3 Encounters:  03/20/17 235 lb (106.6 kg)  01/15/17 242 lb (109.8 kg)  12/17/16 238 lb (108 kg)     Intake/Output Summary (Last 24 hours) at 03/21/2017 1407 Last data filed at 03/21/2017  5093 Gross per 24 hour  Intake 1590 ml  Output -  Net 1590 ml    Physical Exam:   GENERAL: Pleasant-appearing in no apparent distress.  HEAD, EYES, EARS, NOSE AND THROAT: Atraumatic, normocephalic. Extraocular muscles are intact. Pupils equal and reactive to light. Sclerae anicteric. No conjunctival injection. No oro-pharyngeal erythema.  NECK: Supple. There is no jugular venous distention. No bruits, no lymphadenopathy, no thyromegaly.  HEART: Regular rate and rhythm,. No murmurs, no rubs, no clicks.  LUNGS: Clear to auscultation bilaterally. No rales or rhonchi. No wheezes.  ABDOMEN: Soft, flat, nontender, nondistended. Has good  bowel sounds. No hepatosplenomegaly appreciated.  EXTREMITIES: No evidence of any cyanosis, clubbing, or peripheral edema.  +2 pedal and radial pulses bilaterally.  NEUROLOGIC: The patient is alert, awake, and oriented x3 with no focal motor or sensory deficits appreciated bilaterally.  SKIN: Moist and warm with no rashes appreciated.  Psych: Not anxious, depressed LN: No inguinal LN enlargement    Antibiotics   Anti-infectives (From admission, onward)   None      Medications   Scheduled Meds: . [MAR Hold] cholecalciferol  2,000 Units Oral Daily  . [MAR Hold] cyanocobalamin  500 mcg Oral QODAY  . [MAR Hold] fluticasone  1 spray Each Nare BID  . [MAR Hold] lisinopril  7.5 mg Oral Daily  . [MAR Hold] metoprolol succinate  50 mg Oral Daily  . [MAR Hold] oxybutynin  5 mg Oral BID  . [MAR Hold] pantoprazole (PROTONIX) IV  40 mg Intravenous Q12H  . [MAR Hold] pregabalin  150 mg Oral BID   Continuous Infusions: . sodium chloride 75 mL/hr at 03/20/17 1326  . sodium chloride 20 mL/hr at 03/21/17 1304   PRN Meds:.[MAR Hold] acetaminophen **OR** [MAR Hold] acetaminophen, [MAR Hold]  morphine injection, [MAR Hold] ondansetron **OR** [MAR Hold] ondansetron (ZOFRAN) IV, [MAR Hold] senna-docusate, [MAR Hold] traMADol, [MAR Hold] traZODone   Data Review:    Micro Results No results found for this or any previous visit (from the past 240 hour(s)).  Radiology Reports Ct Abdomen Pelvis Wo Contrast  Result Date: 03/20/2017 CLINICAL DATA:  foreign body sensation EXAM: CT ABDOMEN AND PELVIS WITHOUT CONTRAST TECHNIQUE: Multidetector CT imaging of the abdomen and pelvis was performed following the standard protocol without IV contrast. COMPARISON:  06/29/2015 FINDINGS: Lower chest: Lung bases demonstrate no acute consolidation or pleural effusion. Mild cardiomegaly. Mitral calcifications. Hepatobiliary: Interim development of multiple hypodense liver masses. Question slight nodularity of the liver contour. Surgical absence of the gallbladder. No biliary dilatation. Pancreas: Unremarkable. No pancreatic ductal dilatation or surrounding inflammatory changes. Spleen: Normal in size without focal abnormality. Adrenals/Urinary Tract: Adrenal glands are within normal limits. Staghorn calculus in the lower pole of the left kidney measuring up to 17 mm. No hydronephrosis. Bladder normal. Stomach/Bowel: Status post gastric bypass. Distended gastric pouch filled with fluid and food material which extends into the jejunal limb. No dilated small bowel distal to this is suggest obstruction. No colon wall thickening. Vascular/Lymphatic: Aortic atherosclerosis. Enlarged gastrohepatic lymph node measuring 14 mm. Additional prominent porta hepatis lymph nodes. Reproductive: Status post hysterectomy. No adnexal masses. Other: Negative for free air or free fluid. Musculoskeletal: Degenerative changes of the spine. Grade 1 anterolisthesis of L4 on L5. No acute or suspicious bone lesion is seen. Lucent lesion in the left femoral neck is unchanged. IMPRESSION: 1. Status post gastric bypass with distension of the gastric pouch by fluid and food bolus. There is some extension of the food bolus into the jejunal limb. No dilated small bowel distal to this is suggest obstruction. 2. Interim  development of multiple hypo dense liver masses, concerning for metastatic disease or liver primary. Follow-up contrast-enhanced CT and or MRI is recommended. 3. Enlarged gastrohepatic and porta hepatis nodes are concerning for metastatic disease. 4. Staghorn calculus on the left.  No hydronephrosis. Electronically Signed   By: Donavan Foil M.D.   On: 03/20/2017 03:59   Dg Neck Soft Tissue  Result Date: 03/20/2017 CLINICAL DATA:  Feels like food stuck in the throat. Shortness of breath. EXAM: NECK SOFT TISSUES - 1+ VIEW COMPARISON:  Cervical  spine 09/24/2013 FINDINGS: There is no evidence of retropharyngeal soft tissue swelling or epiglottic enlargement. The cervical airway is unremarkable and no radio-opaque foreign body identified. Postoperative changes with anterior and posterior fixation of the cervical spine. IMPRESSION: No radiopaque soft tissue foreign bodies are identified. Postoperative changes in the cervical spine. Electronically Signed   By: Lucienne Capers M.D.   On: 03/20/2017 02:34   Dg Chest 2 View  Result Date: 03/20/2017 CLINICAL DATA:  Feels like food stuck in the throat. Shortness of breath. EXAM: CHEST  2 VIEW COMPARISON:  07/10/2015 FINDINGS: Mild cardiac enlargement. No pulmonary vascular congestion or edema. Peribronchial thickening with central interstitial changes suggesting chronic bronchitis. Linear scarring in the left mid lung. No focal consolidation. No blunting of costophrenic angles. No pneumothorax. Mediastinal contours appear intact. Postoperative changes in the cervical spine. Degenerative changes in the thoracic spine. No apparent esophageal dilatation. IMPRESSION: Cardiac enlargement. Chronic bronchitic changes in the lungs. No evidence of active pulmonary disease. Electronically Signed   By: Lucienne Capers M.D.   On: 03/20/2017 02:35   Nm Pulmonary Perf And Vent  Result Date: 03/20/2017 CLINICAL DATA:  Shortness of Breath EXAM: NUCLEAR MEDICINE VENTILATION -  PERFUSION LUNG SCAN VIEWS: Anterior, posterior, left lateral, right lateral, RAO, LAO, RPO, LPO -ventilation and perfusion RADIOPHARMACEUTICALS:  32.8 mCi Technetium-16m DTPA aerosol inhalation and 4.23 mCi Technetium-70m MAA IV COMPARISON:  Chest radiograph March 20 2017 FINDINGS: Ventilation: Radiotracer uptake on the ventilation study is homogeneous and symmetric. There are no appreciable ventilation defects. Perfusion: Radiotracer uptake on the perfusion study is homogeneous and symmetric. There are no appreciable perfusion defects. IMPRESSION: No appreciable ventilation or perfusion defects. This study constitutes a very low probability of pulmonary embolus. Electronically Signed   By: Lowella Grip III M.D.   On: 03/20/2017 14:55     CBC Recent Labs  Lab 03/20/17 0116 03/21/17 0612  WBC 10.7 7.7  HGB 11.2* 8.1*  HCT 33.5* 24.8*  PLT 212 163  MCV 92.1 94.3  MCH 30.6 30.7  MCHC 33.3 32.5  RDW 13.5 13.6  LYMPHSABS 1.0  --   MONOABS 0.4  --   EOSABS 0.1  --   BASOSABS 0.0  --     Chemistries  Recent Labs  Lab 03/20/17 0116 03/21/17 0612  NA 142 144  K 4.0 3.6  CL 103 112*  CO2 28 26  GLUCOSE 169* 88  BUN 36* 24*  CREATININE 0.83 0.55  CALCIUM 8.8* 7.5*  AST 21  --   ALT 13*  --   ALKPHOS 66  --   BILITOT 0.5  --    ------------------------------------------------------------------------------------------------------------------ estimated creatinine clearance is 65.6 mL/min (by C-G formula based on SCr of 0.55 mg/dL). ------------------------------------------------------------------------------------------------------------------ No results for input(s): HGBA1C in the last 72 hours. ------------------------------------------------------------------------------------------------------------------ No results for input(s): CHOL, HDL, LDLCALC, TRIG, CHOLHDL, LDLDIRECT in the last 72  hours. ------------------------------------------------------------------------------------------------------------------ No results for input(s): TSH, T4TOTAL, T3FREE, THYROIDAB in the last 72 hours.  Invalid input(s): FREET3 ------------------------------------------------------------------------------------------------------------------ Recent Labs    03/21/17 1123  FOLATE 24.0  FERRITIN 20  TIBC 328  IRON 35  RETICCTPCT 3.1    Coagulation profile No results for input(s): INR, PROTIME in the last 168 hours.  No results for input(s): DDIMER in the last 72 hours.  Cardiac Enzymes Recent Labs  Lab 03/20/17 0116  TROPONINI <0.03   ------------------------------------------------------------------------------------------------------------------ Invalid input(s): POCBNP    Assessment & Plan   Patient is a 82 year old white female presenting with abdominal pain and hypoxia  1.  Abdominal pain could be related to underlying process that is noted on the CT scan EGD results currently pending I discussed with radiology regarding biopsy of the liver lesions they noticed patient's hemoglobin has dropped 3 g  2.  Hypoxia of the etiology unclear chest x-ray is negative VQ scan negative Patient now on room air  3.  Liver lesion as well as intra-abdominal lymph node enlargements Oncology recommending pending Patient will need outpatient liver biopsy according to radiology only if hemoglobin is stable  4.  Anemia her hemoglobin has trended down since August I will check anemia panel I do not think that she has acute GI bleed transfuse if less than 7  5.  Obstructive sleep apnea continue CPAP  6.  Essential hypertension continue therapy with lisinopril and metoprolol  7.  Miscellaneous SCDs      Code Status Orders  (From admission, onward)        Start     Ordered   03/20/17 0636  Full code  Continuous     03/20/17 0635    Code Status History    Date Active Date  Inactive Code Status Order ID Comments User Context   07/31/2013 18:54 08/26/2013 21:06 Full Code [id number]  Meredith Staggers, MD Inpatient   07/30/2013 19:45 07/31/2013 18:54 Full Code [id number]  Cathlyn Parsons, PA-C Inpatient   07/28/2013 18:11 07/30/2013 19:45 Full Code [id number]  Erline Levine, MD Inpatient   07/22/2013 15:06 07/28/2013 14:14 Full Code [id number]  Cathlyn Parsons, PA-C Inpatient   07/22/2013 15:06 07/22/2013 15:06 Full Code [id number]  Cathlyn Parsons, PA-C Inpatient   07/16/2013 05:45 07/22/2013 15:06 Full Code [id number]  Erline Levine, MD Inpatient           Consults gi, oncology   DVT Prophylaxis  lovenox  Lab Results  Component Value Date   PLT 163 03/21/2017     Time Spent in minutes   50min  Greater than 50% of time spent in care coordination and counseling patient regarding the condition and plan of care.   Dustin Flock M.D on 03/21/2017 at 2:07 PM  Between 7am to 6pm - Pager - [phone]  After 6pm go to [email or web address] - password [email or web address] [facility]   Office  [phone]

## 2017-03-21 NOTE — Progress Notes (Signed)
Patient has declined cpap for the night

## 2017-03-21 NOTE — Anesthesia Post-op Follow-up Note (Signed)
Anesthesia QCDR form completed.        

## 2017-03-21 NOTE — Anesthesia Procedure Notes (Signed)
Performed by: Cook-Martin, Kanin Lia Pre-anesthesia Checklist: Patient identified, Emergency Drugs available, Suction available, Patient being monitored and Timeout performed Patient Re-evaluated:Patient Re-evaluated prior to induction Oxygen Delivery Method: Nasal cannula Preoxygenation: Pre-oxygenation with 100% oxygen Induction Type: IV induction Airway Equipment and Method: Bite block Placement Confirmation: CO2 detector and positive ETCO2       

## 2017-03-21 NOTE — Op Note (Addendum)
[name] Procedure Date: 03/21/2017 2:40 PM MRN: [id number] Account #: [id number] Date of Birth: [birth year] Admit Type: Inpatient Age: 82 Room: [facility] ENDO ROOM 4 Gender: Female Note Status: Finalized Procedure:            Upper GI endoscopy Indications:          Dysphagia, Weight loss Providers:            Bryten Maher B. Bonna Gains MD, MD Referring MD:         Elveria Rising. Damita Dunnings, MD (Referring MD) Medicines:            Monitored Anesthesia Care Complications:        No immediate complications. Procedure:            Pre-Anesthesia Assessment:                       - ASA Grade Assessment: III - A patient with severe                        systemic disease.                       - After reviewing the risks and benefits, the patient                        was deemed in satisfactory condition to undergo the                        procedure.                       - Monitored anesthesia care was determined to be                        medically necessary for this procedure based on review                        of the patient's medical history, medications, and                        prior anesthesia history.                       After obtaining informed consent, the endoscope was                        passed under direct vision. Throughout the procedure,                        the patient's blood pressure, pulse, and oxygen                        saturations were monitored continuously. The Endoscope                        was introduced through the mouth, and advanced to the                        afferent and efferent jejunal loops. The upper GI  endoscopy was accomplished with ease. The patient                        tolerated the procedure well. Findings:      A medium-sized, Esophageal mass with no bleeding and no stigmata of       recent bleeding was found in the distal esophagus, 35 to 40 cm from  the       incisors. The mass was partially obstructing and partially       circumferential (involving one-half of the lumen circumference). This       was biopsied with a cold forceps for histology.      The endoscope was able to be advanced past the mass into the stomach       carefully without difficulty. The GE Junction was at 37cm and the mass       extended into the cardia as seen in the images. The Gastric pouch was       5cm in size with gastrojejunal anastomosis at 42cm. Both the afferent       and efferent loops were noted and intubated and were normal. No stigmata       of bleeding was present. 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                        Biopsied. Recommendation:       - Await pathology results.                       - Refer to an oncologist today.                       - Mechanical soft diet. Small bites followed by copious                        fluids                       - Continue present medications.                       - Return patient to hospital ward for ongoing care.                       - The findings and recommendations were discussed with                        the patient. Procedure Code(s):    --- Professional ---                       [phone], Esophagogastroduodenoscopy, flexible, transoral;                        with biopsy, single or multiple Diagnosis Code(s):    --- Professional ---                       D49.0, Neoplasm of unspecified behavior of digestive  system                       R63.4, Abnormal weight loss                       R13.10, Dysphagia, unspecified CPT copyright 2016 American Medical Association. All rights reserved. The codes documented in this report are preliminary and upon coder review may  be revised to meet current compliance requirements.  [name], MD Margretta Sidle B. Bonna Gains MD,  MD 03/21/2017 3:38:41 PM This report has been signed electronically. Number of Addenda: 0 Note Initiated On: 03/21/2017 2:40 PM      [facility]

## 2017-03-21 NOTE — Care Management Note (Signed)
Case Management Note  Patient Details  Name: [name] MRN: [id number] Date of Birth: [birth year]  Subjective/Objective:                  Admitted to [facility] with the diagnosis of abdominal pain. Lives alone, has dog. Son is [name] [phone]). Last seen Dr. Damita Dunnings 12/17/16. Prescriptions are filled at [facility]. Home Health 3 years ago per [facility]. [facility] 4 years ago. Also, [facility] in the past. Home oxygen in the past, doesn't have oxygen anymore. CPAP since 1999 per [facility]. Life Alert, rolling walker, and canes in the home. Takes care of all basic activities of daily living herself, drives. 3 falls in the last year. Last fall was a month ago. Good appetite. Family will transport  Action/Plan: Will continue to follow for discharge plans    Expected Discharge Date:                  Expected Discharge Plan:     In-House Referral:     Discharge planning Services     Post Acute Care Choice:    Choice offered to:     DME Arranged:    DME Agency:     HH Arranged:    [place] Agency:     Status of Service:     If discussed at [employer], dates discussed:    Additional Comments:  Shelbie Ammons, RN MSN CCM Care Management [phone] 03/21/2017, 10:19 AM

## 2017-03-21 NOTE — Anesthesia Postprocedure Evaluation (Signed)
Anesthesia Post Note  Patient: [name]  Procedure(s) Performed: ESOPHAGOGASTRODUODENOSCOPY (EGD) (Left )  Patient location during evaluation: Endoscopy Anesthesia Type: General Level of consciousness: awake and alert Pain management: pain level controlled Vital Signs Assessment: post-procedure vital signs reviewed and stable Respiratory status: spontaneous breathing, nonlabored ventilation, respiratory function stable and patient connected to nasal cannula oxygen Cardiovascular status: blood pressure returned to baseline and stable Postop Assessment: no apparent nausea or vomiting Anesthetic complications: no     Last Vitals:  Vitals:   03/21/17 1549 03/21/17 1559  BP: 128/68   Pulse: 87 82  Resp: 17 12  Temp:    SpO2: 91% 96%    Last Pain:  Vitals:   03/21/17 1519  TempSrc:   PainSc: 0-No pain                 Lurlie Wigen S

## 2017-03-21 NOTE — Transfer of Care (Signed)
   Immediate Anesthesia Transfer of Care Note  Patient: [name]  Procedure(s) Performed: ESOPHAGOGASTRODUODENOSCOPY (EGD) (Left )  Patient Location: PACU  Anesthesia Type:General  Level of Consciousness: awake and sedated  Airway & Oxygen Therapy: Patient Spontanous Breathing and Patient connected to nasal cannula oxygen  Post-op Assessment: Report given to RN and Post -op Vital signs reviewed and stable  Post vital signs: Reviewed and stable  Last Vitals:  Vitals:   03/21/17 0823 03/21/17 1252  BP: (!) 116/51 (!) 105/40  Pulse: 79 72  Resp:  18  Temp: 37.1 C 37.7 C  SpO2: 96% 94%    Last Pain:  Vitals:   03/21/17 1252  TempSrc: Tympanic  PainSc: 0-No pain         Complications: No apparent anesthesia complications

## 2017-03-21 NOTE — Progress Notes (Signed)
*  PRELIMINARY RESULTS* Echocardiogram 2D Echocardiogram has been performed.  Sherrie Sport 03/21/2017, 9:50 AM

## 2017-03-21 NOTE — Consult Note (Addendum)
Hematology/Oncology Consult note [facility] [phone] [phone]  Patient Care Team: Tonia Ghent, MD as PCP - General (Family Medicine)   Name of the patient: [name]  [id number]  [birth year]   Date of visit: 03/21/17 REASON FOR COSULTATION:  Multiple liver mass History of presenting illness-  Patient is a 82 year old female with past medical history listed as below who presented with complaints of epigastric discomfort, and sensation of food being stuck. Patient also reports that epigastric pain is aching in nature, and intermittent, for about several months. Associated with weight loss about 20 pounds in the past year. Patient also felt shortness of breath and in the emergency room oxygen saturation will at 83%. CT abdomen showed multiple masses in the liver, unknown etiology. Patient had history of gastric bypass in 2004. VQ scan negative for PE. Today patient is nothing by mouth, will have endoscopy procedure today. Report family history of breast cancer, but she has been having routine mammograms. Lives alone. She's a former smoker.  Review of systems- Review of Systems  Constitutional: Positive for weight loss. Negative for chills and fever.  HENT: Negative for nosebleeds.   Eyes: Negative for blurred vision.  Respiratory: Negative for cough.   Cardiovascular: Negative for chest pain.  Gastrointestinal: Positive for abdominal pain.       Dysphagia  Genitourinary: Negative for dysuria.  Musculoskeletal: Negative for myalgias.  Skin: Negative for rash.  Neurological: Negative for dizziness.  Endo/Heme/Allergies: Bruises/bleeds easily.  Psychiatric/Behavioral: Negative for suicidal ideas.    Allergies  Allergen Reactions  . Codeine Nausea Only  . Doxycycline Other (See Comments)    Lip swelling  . Gabapentin Other (See Comments)    Swelling.   . Iohexol Itching    07-15-13 pt developed itching on fingers after  contrast given. Dr. Irish Elders looked at pt and said to put in system as allergy. BB  . Other Other (See Comments)    Contrast Dye- caused fever, chills, awful feeling  . Oxycodone Other (See Comments)    nausea  . Quinolones Swelling    Lip swelling  . Synvisc [Hylan G-F 20]     Patient Active Problem List   Diagnosis Date Noted  . Abdominal pain 03/20/2017  . Insomnia 12/19/2016  . Advance care planning 12/19/2016  . Asthma without status asthmaticus 12/03/2013  . Borderline diabetes 12/03/2013  . HTN (hypertension) 12/03/2013  . Arthritis, degenerative 12/03/2013  . 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 Restless leg 12/03/2013  . C4 spinal cord injury ([place]) 12/03/2013  . Dysphonia 11/24/2013  . Neuropathy 10/22/2013  . OSA on CPAP 07/16/2013     Past Medical History:  Diagnosis Date  . Arthritis   . Asthma   . Dysphonia   . Hypertension   . Melanoma ([place])   . OAB (overactive bladder)   . OSA on CPAP   . 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 Pupil asymmetry    R pupil defect  . Sleep apnea   . Spinal cord abscess      Past Surgical History:  Procedure Laterality Date  . APPENDECTOMY    . CATARACT EXTRACTION    . CHOLECYSTECTOMY    . dental implant    . GASTRIC BYPASS    . HIATAL HERNIA REPAIR    . MELANOMA EXCISION    . TONSILLECTOMY    . TOTAL KNEE ARTHROPLASTY      Social History   Socioeconomic History  .  Marital status: Widowed    Spouse name: Not on file  . Number of children: 2  . Years of education: Not on file  . Highest education level: Master's degree (e.g., MA, MS, MEng, MEd, MSW, MBA)  Social Needs  . Financial resource strain: Not hard at all  . Food insecurity - worry: Never true  . Food insecurity - inability: Never true  . Transportation needs - medical: No  . Transportation needs - non-medical: No  Occupational History  . Not on file  Tobacco Use  . Smoking status: Former Research scientist (life sciences)  . Smokeless tobacco: Never  Used  Substance and Sexual Activity  . Alcohol use: No  . Drug use: No  . Sexual activity: No  Other Topics Concern  . Not on file  Social History Narrative   Marital Status- Widowed    Lives by herself    Employement- Retired Pharmacist, hospital   Exercise hx- Does PT      Family History  Problem Relation Age of Onset  . Breast cancer Mother 77  . Breast cancer Sister 16  . Colon cancer Neg Hx      Current Facility-Administered Medications:  .  0.9 %  sodium chloride infusion, , Intravenous, Continuous, Pyreddy, Pavan, MD, Last Rate: 75 mL/hr at 03/20/17 1326 .  0.9 %  sodium chloride infusion, , Intravenous, Continuous, Tahiliani, Varnita B, MD .  acetaminophen (TYLENOL) tablet 650 mg, 650 mg, Oral, Q6H PRN **OR** acetaminophen (TYLENOL) suppository 650 mg, 650 mg, Rectal, Q6H PRN, Pyreddy, Pavan, MD .  cholecalciferol (VITAMIN D) tablet 2,000 Units, 2,000 Units, Oral, Daily, Pyreddy, Pavan, MD, 2,000 Units at 03/20/17 1022 .  cyanocobalamin tablet 500 mcg, 500 mcg, Oral, QODAY, Pyreddy, Pavan, MD .  fluticasone (FLONASE) 50 MCG/ACT nasal spray 1 spray, 1 spray, Each Nare, BID, Pyreddy, Pavan, MD, 1 spray at 03/20/17 2113 .  lisinopril (PRINIVIL,ZESTRIL) tablet 7.5 mg, 7.5 mg, Oral, Daily, Saundra Shelling, MD, Stopped at 03/20/17 1037 .  metoprolol succinate (TOPROL-XL) 24 hr tablet 50 mg, 50 mg, Oral, Daily, Pyreddy, Pavan, MD, 50 mg at 03/21/17 0832 .  morphine 2 MG/ML injection 1 mg, 1 mg, Intravenous, Q4H PRN, Dustin Flock, MD, 1 mg at 03/21/17 0942 .  ondansetron (ZOFRAN) tablet 4 mg, 4 mg, Oral, Q6H PRN **OR** ondansetron (ZOFRAN) injection 4 mg, 4 mg, Intravenous, Q6H PRN, Pyreddy, Pavan, MD .  oxybutynin (DITROPAN) tablet 5 mg, 5 mg, Oral, BID, Pyreddy, Pavan, MD, 5 mg at 03/20/17 2113 .  pantoprazole (PROTONIX) injection 40 mg, 40 mg, Intravenous, Q12H, Dustin Flock, MD .  pregabalin (LYRICA) capsule 150 mg, 150 mg, Oral, BID, Pyreddy, Pavan, MD, 150 mg at 03/20/17 2112 .   senna-docusate (Senokot-S) tablet 1 tablet, 1 tablet, Oral, QHS PRN, Pyreddy, Pavan, MD .  traMADol (ULTRAM) tablet 50 mg, 50 mg, Oral, Q6H PRN, Pyreddy, Pavan, MD .  traZODone (DESYREL) tablet 25 mg, 25 mg, Oral, QHS PRN, Saundra Shelling, MD   Physical exam:  Vitals:   03/20/17 1244 03/20/17 2031 03/21/17 0413 03/21/17 0823  BP: (!) 112/36 (!) 105/47 (!) 101/46 (!) 116/51  Pulse: 65 70 71 79  Resp: 18  16   Temp: 98.7 F (37.1 C) 98.4 F (36.9 C) 97.7 F (36.5 C) 98.8 F (37.1 C)  TempSrc: Oral Oral Oral Oral  SpO2: 97% 96% 92% 96%  Weight:      Height:       GENERAL:Alert, no distress and comfortable.  EYES: no pallor or icterus OROPHARYNX: no thrush  or ulceration; good dentition  NECK: supple, no masses felt LYMPH:  no palpable lymphadenopathy in the cervical, axillary or inguinal regions LUNGS: clear to auscultation and  No wheeze or crackles HEART/CVS: regular rate & rhythm and positive for systolic murmur; No lower extremity edema ABDOMEN: abdomen soft, non-tender and normal bowel sounds Musculoskeletal:no cyanosis of digits and no clubbing  PSYCH: alert & oriented x 3  NEURO: no focal motor/sensory deficits SKIN:  no rashes or significant lesions     CMP Latest Ref Rng & Units 03/21/2017  Glucose 65 - 99 mg/dL 88  BUN 6 - 20 mg/dL 24(H)  Creatinine 0.44 - 1.00 mg/dL 0.55  Sodium 135 - 145 mmol/L 144  Potassium 3.5 - 5.1 mmol/L 3.6  Chloride 101 - 111 mmol/L 112(H)  CO2 22 - 32 mmol/L 26  Calcium 8.9 - 10.3 mg/dL 7.5(L)  Total Protein 6.5 - 8.1 g/dL -  Total Bilirubin 0.3 - 1.2 mg/dL -  Alkaline Phos 38 - 126 U/L -  AST 15 - 41 U/L -  ALT 14 - 54 U/L -   CBC Latest Ref Rng & Units 03/21/2017  WBC 3.6 - 11.0 K/uL 7.7  Hemoglobin 12.0 - 16.0 g/dL 8.1(L)  Hematocrit 35.0 - 47.0 % 24.8(L)  Platelets 150 - 440 K/uL 163     Ct Abdomen Pelvis Wo Contrast  Result Date: 03/20/2017 CLINICAL DATA:  foreign body sensation EXAM: CT ABDOMEN AND PELVIS WITHOUT  CONTRAST TECHNIQUE: Multidetector CT imaging of the abdomen and pelvis was performed following the standard protocol without IV contrast. COMPARISON:  06/29/2015 FINDINGS: Lower chest: Lung bases demonstrate no acute consolidation or pleural effusion. Mild cardiomegaly. Mitral calcifications. Hepatobiliary: Interim development of multiple hypodense liver masses. Question slight nodularity of the liver contour. Surgical absence of the gallbladder. No biliary dilatation. Pancreas: Unremarkable. No pancreatic ductal dilatation or surrounding inflammatory changes. Spleen: Normal in size without focal abnormality. Adrenals/Urinary Tract: Adrenal glands are within normal limits. Staghorn calculus in the lower pole of the left kidney measuring up to 17 mm. No hydronephrosis. Bladder normal. Stomach/Bowel: Status post gastric bypass. Distended gastric pouch filled with fluid and food material which extends into the jejunal limb. No dilated small bowel distal to this is suggest obstruction. No colon wall thickening. Vascular/Lymphatic: Aortic atherosclerosis. Enlarged gastrohepatic lymph node measuring 14 mm. Additional prominent porta hepatis lymph nodes. Reproductive: Status post hysterectomy. No adnexal masses. Other: Negative for free air or free fluid. Musculoskeletal: Degenerative changes of the spine. Grade 1 anterolisthesis of L4 on L5. No acute or suspicious bone lesion is seen. Lucent lesion in the left femoral neck is unchanged. IMPRESSION: 1. Status post gastric bypass with distension of the gastric pouch by fluid and food bolus. There is some extension of the food bolus into the jejunal limb. No dilated small bowel distal to this is suggest obstruction. 2. Interim development of multiple hypo dense liver masses, concerning for metastatic disease or liver primary. Follow-up contrast-enhanced CT and or MRI is recommended. 3. Enlarged gastrohepatic and porta hepatis nodes are concerning for metastatic disease. 4.  Staghorn calculus on the left.  No hydronephrosis. Electronically Signed   By: Donavan Foil M.D.   On: 03/20/2017 03:59   Dg Neck Soft Tissue  Result Date: 03/20/2017 CLINICAL DATA:  Feels like food stuck in the throat. Shortness of breath. EXAM: NECK SOFT TISSUES - 1+ VIEW COMPARISON:  Cervical spine 09/24/2013 FINDINGS: There is no evidence of retropharyngeal soft tissue swelling or epiglottic enlargement. The cervical airway is  unremarkable and no radio-opaque foreign body identified. Postoperative changes with anterior and posterior fixation of the cervical spine. IMPRESSION: No radiopaque soft tissue foreign bodies are identified. Postoperative changes in the cervical spine. Electronically Signed   By: Lucienne Capers M.D.   On: 03/20/2017 02:34   Dg Chest 2 View  Result Date: 03/20/2017 CLINICAL DATA:  Feels like food stuck in the throat. Shortness of breath. EXAM: CHEST  2 VIEW COMPARISON:  07/10/2015 FINDINGS: Mild cardiac enlargement. No pulmonary vascular congestion or edema. Peribronchial thickening with central interstitial changes suggesting chronic bronchitis. Linear scarring in the left mid lung. No focal consolidation. No blunting of costophrenic angles. No pneumothorax. Mediastinal contours appear intact. Postoperative changes in the cervical spine. Degenerative changes in the thoracic spine. No apparent esophageal dilatation. IMPRESSION: Cardiac enlargement. Chronic bronchitic changes in the lungs. No evidence of active pulmonary disease. Electronically Signed   By: Lucienne Capers M.D.   On: 03/20/2017 02:35   Nm Pulmonary Perf And Vent  Result Date: 03/20/2017 CLINICAL DATA:  Shortness of Breath EXAM: NUCLEAR MEDICINE VENTILATION - PERFUSION LUNG SCAN VIEWS: Anterior, posterior, left lateral, right lateral, RAO, LAO, RPO, LPO -ventilation and perfusion RADIOPHARMACEUTICALS:  32.8 mCi Technetium-58m DTPA aerosol inhalation and 4.23 mCi Technetium-22m MAA IV COMPARISON:  Chest  radiograph March 20 2017 FINDINGS: Ventilation: Radiotracer uptake on the ventilation study is homogeneous and symmetric. There are no appreciable ventilation defects. Perfusion: Radiotracer uptake on the perfusion study is homogeneous and symmetric. There are no appreciable perfusion defects. IMPRESSION: No appreciable ventilation or perfusion defects. This study constitutes a very low probability of pulmonary embolus. Electronically Signed   By: Lowella Grip III M.D.   On: 03/20/2017 14:55    Assessment and plan- Patient is a 82 y.o. female present with dysphagia, epigastric pain, weight loss and image finding of multiple hypodensity lesions in the liver.  # Multiple live lesions # Dysphagia # Unintentional weight loss.  # Normocytic anemia  Discussed with patient about her image results. Awaiting EGD findings  Recommend ultrasound guided liver biopsy to establish tissue diagnosis. Send to a marker including CEA, CA 19.9, AFP. Patient voices understanding.   Iron test reviewed, consistent with early phase of iron deficiency. Given her dysphagia, symptomatic anemia with SOB,  Recommend IV iron with Venofer 200 mg x 1. Additional IV iron can be administrated in outpatient setting.  Thank you for this kind referral and the opportunity to participate in the care of this patient. Will continue follow along her inpatient course.   Dr. Earlie Server, MD, PhD [facility] Pager- [phone] 03/21/2017

## 2017-03-22 ENCOUNTER — Other Ambulatory Visit: Payer: Self-pay

## 2017-03-22 LAB — CBC
HEMATOCRIT: 26.1 % — AB (ref 35.0–47.0)
HEMOGLOBIN: 8.6 g/dL — AB (ref 12.0–16.0)
MCH: 30.9 pg (ref 26.0–34.0)
MCHC: 33.1 g/dL (ref 32.0–36.0)
MCV: 93.5 fL (ref 80.0–100.0)
Platelets: 159 10*3/uL (ref 150–440)
RBC: 2.79 MIL/uL — ABNORMAL LOW (ref 3.80–5.20)
RDW: 13.9 % (ref 11.5–14.5)
WBC: 6.7 10*3/uL (ref 3.6–11.0)

## 2017-03-22 LAB — BASIC METABOLIC PANEL
Anion gap: 7 (ref 5–15)
BUN: 17 mg/dL (ref 6–20)
CHLORIDE: 107 mmol/L (ref 101–111)
CO2: 27 mmol/L (ref 22–32)
CREATININE: 0.63 mg/dL (ref 0.44–1.00)
Calcium: 8.3 mg/dL — ABNORMAL LOW (ref 8.9–10.3)
GFR calc Af Amer: >60 mL/min (ref >60)
GFR calc non Af Amer: >60 mL/min (ref >60)
GLUCOSE: 108 mg/dL — AB (ref 65–99)
Potassium: 4.1 mmol/L (ref 3.5–5.1)
Sodium: 141 mmol/L (ref 135–145)

## 2017-03-22 MED ORDER — IRON SUCROSE 20 MG/ML IV SOLN
100.0000 mg | Freq: Once | INTRAVENOUS | Status: DC
  Filled 2017-03-22: qty 5

## 2017-03-22 MED ORDER — SENNOSIDES-DOCUSATE SODIUM 8.6-50 MG PO TABS: 1.0000 | ORAL_TABLET | Freq: Every evening | ORAL | Status: DC | PRN

## 2017-03-22 MED ORDER — FERROUS SULFATE 325 (65 FE) MG PO TBEC
325.0000 mg | DELAYED_RELEASE_TABLET | Freq: Two times a day (BID) | ORAL | 3 refills | Status: DC
Start: 2017-03-22 — End: 2017-04-14

## 2017-03-22 NOTE — Discharge Summary (Addendum)
[name] NAME: [name]    MR#:  [id number]  DATE OF BIRTH:  [birth year]  DATE OF ADMISSION:  03/20/2017 ADMITTING PHYSICIAN: Saundra Shelling, MD  DATE OF DISCHARGE: 03/22/17   PRIMARY CARE PHYSICIAN: Tonia Ghent, MD    ADMISSION DIAGNOSIS:  Epigastric pain [R10.13] Globus sensation [F45.8] Hypoxia [R09.02] Acute pancreatitis, unspecified complication status, unspecified pancreatitis type [K85.90] Moderate asthma, unspecified whether complicated, unspecified whether persistent [J45.909]  DISCHARGE DIAGNOSIS:  Active Problems:   Abdominal pain   Liver lesion   Iron deficiency anemia   Dysphagia   Loss of weight   SECONDARY DIAGNOSIS:   Past Medical History:  Diagnosis Date  . Arthritis   . Asthma   . Dysphonia   . Hypertension   . Melanoma ([place])   . OAB (overactive bladder)   . OSA on CPAP   . 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 Pupil asymmetry    R pupil defect  . Sleep apnea   . Spinal cord abscess     HOSPITAL COURSE:  HISTORY OF PRESENT ILLNESS: [name]  is a 82 y.o. female with a known history of sleep apnea, bronchial asthma, hypertension, arthritis presented to the emergency room epigastric discomfort.  Patient also complains of food being stuck near the throat area.  This kind of sensation and movement for the last few months.  The epigastric pain is aching in nature 4 out of 10 on a scale of 1-10.  Patient also felt short of breath when she presented to emergency room her O2 sats were only 83% she was put on oxygen by nasal cannula.  Patient was opened with CT abdomen which showed multiple masses in the liver unknown etiology.  CT abdomen also showed a staghorn calculus in the left kidney.  Hospitalist service was consulted.  No complains of any left flank pain.  No fever or chills.   1.  Abdominal pain could be related to underlying process that is noted on the  CT scan Abdominal pain resolved EGD with esophageal mass status post biopsy done okay to discharge patient from GI standpoint.  Tolerating diet Outpatient GI follow-up   2.  Hypoxia of the etiology unclear chest x-ray is negative VQ scan negative Patient now on room air  3.  Liver lesion as well as intra-abdominal lymph node enlargements Oncology dr.Yu seen pt, patient follow-up with oncology Patient will need outpatient liver biopsy Patient wants to get IV iron transfusion as an outpatient as well  4.  Anemia her hemoglobin has trended down since August I will check anemia panel I do not think that she has acute GI bleed transfuse if less than 7 Refused IV iron transfusion during the hospital course.  Patient prefers getting that as an outpatient during oncology follow-up. We will give her iron supplements with stool softeners  5.  Obstructive sleep apnea continue CPAP qhs  6.  Essential hypertension continue therapy with lisinopril and metoprolol  7.  Miscellaneous SCDs      DISCHARGE CONDITIONS:   stable  CONSULTS OBTAINED:  Treatment Team:  Virgel Manifold, MD Earlie Server, MD   PROCEDURES EGD -esophageal mass biopsy  DRUG ALLERGIES:   Allergies  Allergen Reactions  . Codeine Nausea Only  . Doxycycline Other (See Comments)    Lip swelling  . Gabapentin Other (See Comments)    Swelling.   . Iohexol Itching  07-15-13 pt developed itching on fingers after contrast given. Dr. Irish Elders looked at pt and said to put in system as allergy. BB  . Other Other (See Comments)    Contrast Dye- caused fever, chills, awful feeling  . Oxycodone Other (See Comments)    nausea  . Quinolones Swelling    Lip swelling  . Synvisc [Hylan G-F 20]     DISCHARGE MEDICATIONS:   Current Discharge Medication List    START taking these medications   Details  ferrous sulfate 325 (65 FE) MG EC tablet Take 1 tablet (325 mg total) 2 (two) times daily by mouth. Qty: 60  tablet, Refills: 3    senna-docusate (SENOKOT-S) 8.6-50 MG tablet Take 1 tablet at bedtime as needed by mouth for mild constipation.      CONTINUE these medications which have NOT CHANGED   Details  acetaminophen (TYLENOL) 325 MG tablet Take 650 mg by mouth every 6 (six) hours as needed.    calcium carbonate (TUMS EX) 750 MG chewable tablet Chew 1 tablet by mouth daily.    Cholecalciferol (VITAMIN D) 2000 UNITS CAPS Take 2,000 Units by mouth daily.    cyanocobalamin 500 MCG tablet Take 500 mcg every other day by mouth.    fluticasone (FLONASE) 50 MCG/ACT nasal spray Place 1 spray into both nostrils 2 (two) times daily.     loratadine (CLARITIN) 10 MG tablet Take 10 mg by mouth daily.    LYRICA 150 MG capsule TAKE 1 CAPSULE BY MOUTH TWO TIMES DAILY Qty: 180 capsule, Refills: 1    metoprolol succinate (TOPROL-XL) 50 MG 24 hr tablet TAKE 1 TABLET BY MOUTH DAILY Qty: 90 tablet, Refills: 3    oxybutynin (DITROPAN) 5 MG tablet TAKE 1 TABLET BY MOUTH IN THE MORNING AND 1/2 TABLET AT BEDTIME Qty: 135 tablet, Refills: 3    traMADol (ULTRAM) 50 MG tablet Take 0.5 tablet (25 mg total) in the morning and 0.5 tablet ( 25 mg total)at noon as needed.    traZODone (DESYREL) 50 MG tablet Take 25 mg at bedtime as needed by mouth for sleep. Take 0.5 tablet (25 mg total) at bedtime      STOP taking these medications     aspirin 81 MG tablet      furosemide (LASIX) 20 MG tablet      hydrochlorothiazide (HYDRODIURIL) 12.5 MG tablet      lisinopril (PRINIVIL,ZESTRIL) 5 MG tablet      meloxicam (MOBIC) 7.5 MG tablet      naproxen sodium (ANAPROX) 220 MG tablet      lisinopril (PRINIVIL,ZESTRIL) 5 MG tablet          DISCHARGE INSTRUCTIONS:   Follow-up with primary care physician in 5-7 days Follow-up with gastroenterology Dr. Vira Agar in 5-7 days Follow-up with oncology Dr. Tasia Catchings 3-5 days Patient needs outpatient liver biopsy ultrasound-guided once hemoglobin is stable  DIET:   Dysphagia 2 diet with thin liquids  DISCHARGE CONDITION:  Fair  ACTIVITY:  Activity as tolerated  OXYGEN:  Home Oxygen: No.   Oxygen Delivery: room air  DISCHARGE LOCATION:  home   If you experience worsening of your admission symptoms, develop shortness of breath, life threatening emergency, suicidal or homicidal thoughts you must seek medical attention immediately by calling 911 or calling your MD immediately  if symptoms less severe.  You Must read complete instructions/literature along with all the possible adverse reactions/side effects for all the Medicines you take and that have been prescribed to you. Take any new  Medicines after you have completely understood and accpet all the possible adverse reactions/side effects.   Please note  You were cared for by a hospitalist during your hospital stay. If you have any questions about your discharge medications or the care you received while you were in the hospital after you are discharged, you can call the unit and asked to speak with the hospitalist on call if the hospitalist that took care of you is not available. Once you are discharged, your primary care physician will handle any further medical issues. Please note that NO REFILLS for any discharge medications will be authorized once you are discharged, as it is imperative that you return to your primary care physician (or establish a relationship with a primary care physician if you do not have one) for your aftercare needs so that they can reassess your need for medications and monitor your lab values.     Today  No chief complaint on file.   Recent is tolerating diet.  Denies any abdominal pain.  Son at bedside.  Wants to go home.  Refusing IV iron during the hospital course.  She would rather get the ultrasound guided liver biopsy as an outpatient and wants to get IV iron at her oncologist office ROS:  CONSTITUTIONAL: Denies fevers, chills. Denies any fatigue, weakness.   EYES: Denies blurry vision, double vision, eye pain. EARS, NOSE, THROAT: Denies tinnitus, ear pain, hearing loss. RESPIRATORY: Denies cough, wheeze, shortness of breath.  CARDIOVASCULAR: Denies chest pain, palpitations, edema.  GASTROINTESTINAL: Denies nausea, vomiting, diarrhea, abdominal pain. Denies bright red blood per rectum. GENITOURINARY: Denies dysuria, hematuria. ENDOCRINE: Denies nocturia or thyroid problems. HEMATOLOGIC AND LYMPHATIC: Denies easy bruising or bleeding. SKIN: Denies rash or lesion. MUSCULOSKELETAL: Denies pain in neck, back, shoulder, knees, hips or arthritic symptoms.  NEUROLOGIC: Denies paralysis, paresthesias.  PSYCHIATRIC: Denies anxiety or depressive symptoms.   VITAL SIGNS:  Blood pressure (!) 108/49, pulse 83, temperature 98.1 F (36.7 C), temperature source Oral, resp. rate (!) 22, height 5\' 3"  (1.6 m), weight 106.6 kg (235 lb), SpO2 90 %.  I/O:    Intake/Output Summary (Last 24 hours) at 03/22/2017 1400 Last data filed at 03/22/2017 1359 Gross per 24 hour  Intake 1358.75 ml  Output 0 ml  Net 1358.75 ml    PHYSICAL EXAMINATION:  GENERAL:  82 y.o.-year-old patient lying in the bed with no acute distress.  EYES: Pupils equal, round, reactive to light and accommodation. No scleral icterus. Extraocular muscles intact.  HEENT: Head atraumatic, normocephalic. Oropharynx and nasopharynx clear.  NECK:  Supple, no jugular venous distention. No thyroid enlargement, no tenderness.  LUNGS: Normal breath sounds bilaterally, no wheezing, rales,rhonchi or crepitation. No use of accessory muscles of respiration.  CARDIOVASCULAR: S1, S2 normal. No murmurs, rubs, or gallops.  ABDOMEN: Soft, non-tender, non-distended. Bowel sounds present.  EXTREMITIES: No pedal edema, cyanosis, or clubbing.  NEUROLOGIC: Cranial nerves II through XII are intact. Muscle strength 5/5 in all extremities. Sensation intact. Gait not checked.  PSYCHIATRIC: The patient is alert and  oriented x 3.  SKIN: No obvious rash, lesion, or ulcer.   DATA REVIEW:   CBC Recent Labs  Lab 03/22/17 0504  WBC 6.7  HGB 8.6*  HCT 26.1*  PLT 159    Chemistries  Recent Labs  Lab 03/20/17 0116  03/22/17 0504  NA 142   < > 141  K 4.0   < > 4.1  CL 103   < > 107  CO2 28   < >  27  GLUCOSE 169*   < > 108*  BUN 36*   < > 17  CREATININE 0.83   < > 0.63  CALCIUM 8.8*   < > 8.3*  AST 21  --   --   ALT 13*  --   --   ALKPHOS 66  --   --   BILITOT 0.5  --   --    < > = values in this interval not displayed.    Cardiac Enzymes Recent Labs  Lab 03/20/17 0116  TROPONINI <0.03    Microbiology Results  Results for orders placed or performed during the hospital encounter of 06/29/15  C difficile quick scan w PCR reflex     Status: None   Collection Time: 06/29/15  7:18 PM  Result Value Ref Range Status   C Diff antigen NEGATIVE NEGATIVE Final   C Diff toxin NEGATIVE NEGATIVE Final   C Diff interpretation Negative for C. difficile  Final  Gastrointestinal Panel by PCR , Stool     Status: Abnormal   Collection Time: 06/29/15  7:18 PM  Result Value Ref Range Status   Campylobacter species DETECTED (A) NOT DETECTED Final    Comment: CRITICAL RESULT CALLED TO, READ BACK BY AND VERIFIED WITH: TERRY BROGAN AT 2243 06/29/15 SDR    Plesimonas shigelloides NOT DETECTED NOT DETECTED Final   Salmonella species NOT DETECTED NOT DETECTED Final   Yersinia enterocolitica NOT DETECTED NOT DETECTED Final   Vibrio species NOT DETECTED NOT DETECTED Final   Vibrio cholerae NOT DETECTED NOT DETECTED Final   Enteroaggregative E coli (EAEC) NOT DETECTED NOT DETECTED Final   Enteropathogenic E coli (EPEC) NOT DETECTED NOT DETECTED Final   Enterotoxigenic E coli (ETEC) NOT DETECTED NOT DETECTED Final   Shiga like toxin producing E coli (STEC) NOT DETECTED NOT DETECTED Final   E. coli O157 NOT DETECTED NOT DETECTED Final   Shigella/Enteroinvasive E coli (EIEC) NOT DETECTED NOT DETECTED Final    Cryptosporidium NOT DETECTED NOT DETECTED Final   Cyclospora cayetanensis NOT DETECTED NOT DETECTED Final   Entamoeba histolytica NOT DETECTED NOT DETECTED Final   Giardia lamblia NOT DETECTED NOT DETECTED Final   Adenovirus F40/41 NOT DETECTED NOT DETECTED Final   Astrovirus NOT DETECTED NOT DETECTED Final   Norovirus GI/GII DETECTED (A) NOT DETECTED Final    Comment: CRITICAL RESULT CALLED TO, READ BACK BY AND VERIFIED WITH: TERRY BROGAN AT 2243 06/29/15 SDR    Rotavirus A NOT DETECTED NOT DETECTED Final   Sapovirus (I, II, IV, and V) NOT DETECTED NOT DETECTED Final    RADIOLOGY:  Ct Abdomen Pelvis Wo Contrast  Result Date: 03/20/2017 CLINICAL DATA:  foreign body sensation EXAM: CT ABDOMEN AND PELVIS WITHOUT CONTRAST TECHNIQUE: Multidetector CT imaging of the abdomen and pelvis was performed following the standard protocol without IV contrast. COMPARISON:  06/29/2015 FINDINGS: Lower chest: Lung bases demonstrate no acute consolidation or pleural effusion. Mild cardiomegaly. Mitral calcifications. Hepatobiliary: Interim development of multiple hypodense liver masses. Question slight nodularity of the liver contour. Surgical absence of the gallbladder. No biliary dilatation. Pancreas: Unremarkable. No pancreatic ductal dilatation or surrounding inflammatory changes. Spleen: Normal in size without focal abnormality. Adrenals/Urinary Tract: Adrenal glands are within normal limits. Staghorn calculus in the lower pole of the left kidney measuring up to 17 mm. No hydronephrosis. Bladder normal. Stomach/Bowel: Status post gastric bypass. Distended gastric pouch filled with fluid and food material which extends into the jejunal limb. No dilated small bowel distal to this  is suggest obstruction. No colon wall thickening. Vascular/Lymphatic: Aortic atherosclerosis. Enlarged gastrohepatic lymph node measuring 14 mm. Additional prominent porta hepatis lymph nodes. Reproductive: Status post hysterectomy. No  adnexal masses. Other: Negative for free air or free fluid. Musculoskeletal: Degenerative changes of the spine. Grade 1 anterolisthesis of L4 on L5. No acute or suspicious bone lesion is seen. Lucent lesion in the left femoral neck is unchanged. IMPRESSION: 1. Status post gastric bypass with distension of the gastric pouch by fluid and food bolus. There is some extension of the food bolus into the jejunal limb. No dilated small bowel distal to this is suggest obstruction. 2. Interim development of multiple hypo dense liver masses, concerning for metastatic disease or liver primary. Follow-up contrast-enhanced CT and or MRI is recommended. 3. Enlarged gastrohepatic and porta hepatis nodes are concerning for metastatic disease. 4. Staghorn calculus on the left.  No hydronephrosis. Electronically Signed   By: Donavan Foil M.D.   On: 03/20/2017 03:59   Dg Neck Soft Tissue  Result Date: 03/20/2017 CLINICAL DATA:  Feels like food stuck in the throat. Shortness of breath. EXAM: NECK SOFT TISSUES - 1+ VIEW COMPARISON:  Cervical spine 09/24/2013 FINDINGS: There is no evidence of retropharyngeal soft tissue swelling or epiglottic enlargement. The cervical airway is unremarkable and no radio-opaque foreign body identified. Postoperative changes with anterior and posterior fixation of the cervical spine. IMPRESSION: No radiopaque soft tissue foreign bodies are identified. Postoperative changes in the cervical spine. Electronically Signed   By: Lucienne Capers M.D.   On: 03/20/2017 02:34   Dg Chest 2 View  Result Date: 03/20/2017 CLINICAL DATA:  Feels like food stuck in the throat. Shortness of breath. EXAM: CHEST  2 VIEW COMPARISON:  07/10/2015 FINDINGS: Mild cardiac enlargement. No pulmonary vascular congestion or edema. Peribronchial thickening with central interstitial changes suggesting chronic bronchitis. Linear scarring in the left mid lung. No focal consolidation. No blunting of costophrenic angles. No  pneumothorax. Mediastinal contours appear intact. Postoperative changes in the cervical spine. Degenerative changes in the thoracic spine. No apparent esophageal dilatation. IMPRESSION: Cardiac enlargement. Chronic bronchitic changes in the lungs. No evidence of active pulmonary disease. Electronically Signed   By: Lucienne Capers M.D.   On: 03/20/2017 02:35   Nm Pulmonary Perf And Vent  Result Date: 03/20/2017 CLINICAL DATA:  Shortness of Breath EXAM: NUCLEAR MEDICINE VENTILATION - PERFUSION LUNG SCAN VIEWS: Anterior, posterior, left lateral, right lateral, RAO, LAO, RPO, LPO -ventilation and perfusion RADIOPHARMACEUTICALS:  32.8 mCi Technetium-70m DTPA aerosol inhalation and 4.23 mCi Technetium-66m MAA IV COMPARISON:  Chest radiograph March 20 2017 FINDINGS: Ventilation: Radiotracer uptake on the ventilation study is homogeneous and symmetric. There are no appreciable ventilation defects. Perfusion: Radiotracer uptake on the perfusion study is homogeneous and symmetric. There are no appreciable perfusion defects. IMPRESSION: No appreciable ventilation or perfusion defects. This study constitutes a very low probability of pulmonary embolus. Electronically Signed   By: Lowella Grip III M.D.   On: 03/20/2017 14:55    EKG:   Orders placed or performed during the hospital encounter of 03/20/17  . ED EKG  . ED EKG      Management plans discussed with the patient, son and they are in agreement.  CODE STATUS:     Code Status Orders  (From admission, onward)        Start     Ordered   03/20/17 0636  Full code  Continuous     03/20/17 0635    Code Status History  Date Active Date Inactive Code Status Order ID Comments User Context   07/31/2013 18:54 08/26/2013 21:06 Full Code [id number]  Meredith Staggers, MD Inpatient   07/30/2013 19:45 07/31/2013 18:54 Full Code [id number]  Cathlyn Parsons, PA-C Inpatient   07/28/2013 18:11 07/30/2013 19:45 Full Code [id number]  Erline Levine, MD  Inpatient   07/22/2013 15:06 07/28/2013 14:14 Full Code [id number]  Cathlyn Parsons, PA-C Inpatient   07/22/2013 15:06 07/22/2013 15:06 Full Code [id number]  Cathlyn Parsons, PA-C Inpatient   07/16/2013 05:45 07/22/2013 15:06 Full Code [id number]  Erline Levine, MD Inpatient    Advance Directive Documentation     Most Recent Value  Type of Advance Directive  Healthcare Power of Attorney, Living will  Pre-existing out of facility DNR order (yellow form or pink MOST form)  No data  "MOST" Form in Place?  No data      TOTAL TIME TAKING CARE OF THIS PATIENT: 43  minutes.   Note: This dictation was prepared with Dragon dictation along with smaller phrase technology. Any transcriptional errors that result from this process are unintentional.   @[facility] @  on 03/22/2017 at 2:00 PM  Between 7am to 6pm - Pager - [phone]  After 6pm go to [email or web address] - password [email or web address] [facility]  Office  [phone]  CC: Primary care physician; Tonia Ghent, MD

## 2017-03-22 NOTE — Progress Notes (Signed)
Discharge teaching given with teach back. Son at bedside.

## 2017-03-22 NOTE — Progress Notes (Signed)
Hematology/Oncology Progress Note [facility] [phone] Fax:([phone]) [phone]  Patient Care Team: Tonia Ghent, MD as PCP - General (Family Medicine)   Name of the patient: [name]  [id number]  Apr 20, 1935  Date of visit: 11/10//2018   INTERVAL HISTORY- S/p EGD, found to have esophageal mass. Son at bedside, patient reports feeling well and wants to go home    Review of systems- ROS Constitutional: Negative for fever, night sweats,change in appetite. (+) unintentional weight loss,  HENT: Negative for ear pain, hearing loss, nasal bleeding. Dysphagia Eyes: Negative for eye pain, double vision   Respiratory: Negative for wheezing, shortness of breath, cough Cardiovascular: Negative for chest pain, palpitation.   Gastrointestinal: Negative abdominal pain, diarrhea, nausea vomiting Endocrine: Negative  Genitourinary: Negative for dysuria, hematuria, frequency Skin: Negative for rash, iching, bruising Neurological: Negative for headache, dizziness, seizure Hematological: Negative for easy bruising/bleeding, lymph node enlargement Psychiatric/Behavioral: Negative for depression, anxiety, suicidality Allergies  Allergen Reactions  . Codeine Nausea Only  . Doxycycline Other (See Comments)    Lip swelling  . Gabapentin Other (See Comments)    Swelling.   . Iohexol Itching    07-15-13 pt developed itching on fingers after contrast given. Dr. Irish Elders looked at pt and said to put in system as allergy. BB  . Other Other (See Comments)    Contrast Dye- caused fever, chills, awful feeling  . Oxycodone Other (See Comments)    nausea  . Quinolones Swelling    Lip swelling  . Synvisc [[place] G-F 20]     Patient Active Problem List   Diagnosis Date Noted  . Liver lesion   . Iron deficiency anemia   . Dysphagia   . Loss of weight   . Abdominal pain 03/20/2017  . Insomnia 12/19/2016  . Advance care planning 12/19/2016  . Asthma without  status asthmaticus 12/03/2013  . Borderline diabetes 12/03/2013  . HTN (hypertension) 12/03/2013  . Arthritis, degenerative 12/03/2013  . 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 Restless leg 12/03/2013  . C4 spinal cord injury ([place]) 12/03/2013  . Dysphonia 11/24/2013  . Neuropathy 10/22/2013  . OSA on CPAP 07/16/2013     Past Medical History:  Diagnosis Date  . Arthritis   . Asthma   . Dysphonia   . Hypertension   . Melanoma ([place])   . OAB (overactive bladder)   . OSA on CPAP   . 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 Pupil asymmetry    R pupil defect  . Sleep apnea   . Spinal cord abscess      Past Surgical History:  Procedure Laterality Date  . APPENDECTOMY    . CATARACT EXTRACTION    . CHOLECYSTECTOMY    . dental implant    . GASTRIC BYPASS    . HIATAL HERNIA REPAIR    . MELANOMA EXCISION    . TONSILLECTOMY    . TOTAL KNEE ARTHROPLASTY      Social History   Socioeconomic History  . Marital status: Widowed    Spouse name: Not on file  . Number of children: 2  . Years of education: Not on file  . Highest education level: Master's degree (e.g., MA, MS, MEng, MEd, MSW, MBA)  Social Needs  . Financial resource strain: Not hard at all  . Food insecurity - worry: Never true  . Food insecurity - inability: Never true  . Transportation needs - medical: No  . Transportation needs -  non-medical: No  Occupational History  . Not on file  Tobacco Use  . Smoking status: Former Research scientist (life sciences)  . Smokeless tobacco: Never Used  Substance and Sexual Activity  . Alcohol use: No  . Drug use: No  . Sexual activity: No  Other Topics Concern  . Not on file  Social History Narrative   Marital Status- Widowed    Lives by herself    Employement- Retired Pharmacist, hospital   Exercise hx- Does PT      Family History  Problem Relation Age of Onset  . Breast cancer Mother 102  . Breast cancer Sister 33  . Colon cancer Neg Hx      Current Facility-Administered  Medications:  .  0.9 %  sodium chloride infusion, , Intravenous, Continuous, Pyreddy, Pavan, MD, Last Rate: 75 mL/hr at 03/22/17 0704 .  acetaminophen (TYLENOL) tablet 650 mg, 650 mg, Oral, Q6H PRN **OR** acetaminophen (TYLENOL) suppository 650 mg, 650 mg, Rectal, Q6H PRN, Pyreddy, Pavan, MD .  cholecalciferol (VITAMIN D) tablet 2,000 Units, 2,000 Units, Oral, Daily, Dustin Flock, MD, 2,000 Units at 03/20/17 1022 .  fluticasone (FLONASE) 50 MCG/ACT nasal spray 1 spray, 1 spray, Each Nare, BID, Pyreddy, Pavan, MD, 1 spray at 03/22/17 0945 .  lisinopril (PRINIVIL,ZESTRIL) tablet 7.5 mg, 7.5 mg, Oral, Daily, Saundra Shelling, MD, Stopped at 03/20/17 1037 .  metoprolol succinate (TOPROL-XL) 24 hr tablet 50 mg, 50 mg, Oral, Daily, Pyreddy, Pavan, MD, 50 mg at 03/22/17 0941 .  morphine 2 MG/ML injection 1 mg, 1 mg, Intravenous, Q4H PRN, Dustin Flock, MD, 1 mg at 03/21/17 0942 .  ondansetron (ZOFRAN) tablet 4 mg, 4 mg, Oral, Q6H PRN **OR** ondansetron (ZOFRAN) injection 4 mg, 4 mg, Intravenous, Q6H PRN, Pyreddy, Pavan, MD .  oxybutynin (DITROPAN) tablet 5 mg, 5 mg, Oral, BID, Pyreddy, Pavan, MD, 5 mg at 03/22/17 0941 .  pantoprazole (PROTONIX) injection 40 mg, 40 mg, Intravenous, Q12H, Dustin Flock, MD, 40 mg at 03/22/17 0942 .  pregabalin (LYRICA) capsule 150 mg, 150 mg, Oral, BID, Pyreddy, Pavan, MD, 150 mg at 03/22/17 0941 .  senna-docusate (Senokot-S) tablet 1 tablet, 1 tablet, Oral, QHS PRN, Pyreddy, Pavan, MD .  traMADol (ULTRAM) tablet 50 mg, 50 mg, Oral, Q6H PRN, Pyreddy, Pavan, MD, 50 mg at 03/22/17 0659 .  traZODone (DESYREL) tablet 25 mg, 25 mg, Oral, QHS PRN, Pyreddy, Pavan, MD, 25 mg at 03/21/17 2127 .  vitamin B-12 (CYANOCOBALAMIN) tablet 500 mcg, 500 mcg, Oral, Mathis Fare, MD, 500 mcg at 03/22/17 0941   Physical exam:  Vitals:   03/21/17 1559 03/21/17 1946 03/22/17 0527 03/22/17 1444  BP:  (!) 117/46 (!) 108/49 (!) 136/42  Pulse: 82 96 83 79  Resp: 12  (!) 22     Temp:  98.9 F (37.2 C) 98.1 F (36.7 C) 98 F (36.7 C)  TempSrc:  Oral Oral Oral  SpO2: 96% 93% 90% 95%  Weight:      Height:       GENERAL:Alert, no distress and comfortable.  EYES: no pallor or icterus OROPHARYNX: no thrush or ulceration; good dentition  NECK: supple, no masses felt LYMPH:  no palpable lymphadenopathy in the cervical, axillary or inguinal regions LUNGS: clear to auscultation and  No wheeze or crackles HEART/CVS: regular rate & rhythm and no murmurs; No lower extremity edema ABDOMEN: abdomen soft, non-tender and normal bowel sounds Musculoskeletal:no cyanosis of digits and no clubbing  PSYCH: alert & oriented x 3  NEURO: no focal motor/sensory deficits SKIN:  no rashes or significant lesions     CMP Latest Ref Rng & Units 03/22/2017  Glucose 65 - 99 mg/dL 108(H)  BUN 6 - 20 mg/dL 17  Creatinine 0.44 - 1.00 mg/dL 0.63  Sodium 135 - 145 mmol/L 141  Potassium 3.5 - 5.1 mmol/L 4.1  Chloride 101 - 111 mmol/L 107  CO2 22 - 32 mmol/L 27  Calcium 8.9 - 10.3 mg/dL 8.3(L)  Total Protein 6.5 - 8.1 g/dL -  Total Bilirubin 0.3 - 1.2 mg/dL -  Alkaline Phos 38 - 126 U/L -  AST 15 - 41 U/L -  ALT 14 - 54 U/L -   CBC Latest Ref Rng & Units 03/22/2017  WBC 3.6 - 11.0 K/uL 6.7  Hemoglobin 12.0 - 16.0 g/dL 8.6(L)  Hematocrit 35.0 - 47.0 % 26.1(L)  Platelets 150 - 440 K/uL 159    @IMAGES @  Ct Abdomen Pelvis Wo Contrast  Result Date: 03/20/2017 CLINICAL DATA:  foreign body sensation EXAM: CT ABDOMEN AND PELVIS WITHOUT CONTRAST TECHNIQUE: Multidetector CT imaging of the abdomen and pelvis was performed following the standard protocol without IV contrast. COMPARISON:  06/29/2015 FINDINGS: Lower chest: Lung bases demonstrate no acute consolidation or pleural effusion. Mild cardiomegaly. Mitral calcifications. Hepatobiliary: Interim development of multiple hypodense liver masses. Question slight nodularity of the liver contour. Surgical absence of the gallbladder. No  biliary dilatation. Pancreas: Unremarkable. No pancreatic ductal dilatation or surrounding inflammatory changes. Spleen: Normal in size without focal abnormality. Adrenals/Urinary Tract: Adrenal glands are within normal limits. Staghorn calculus in the lower pole of the left kidney measuring up to 17 mm. No hydronephrosis. Bladder normal. Stomach/Bowel: Status post gastric bypass. Distended gastric pouch filled with fluid and food material which extends into the jejunal limb. No dilated small bowel distal to this is suggest obstruction. No colon wall thickening. Vascular/Lymphatic: Aortic atherosclerosis. Enlarged gastrohepatic lymph node measuring 14 mm. Additional prominent porta hepatis lymph nodes. Reproductive: Status post hysterectomy. No adnexal masses. Other: Negative for free air or free fluid. Musculoskeletal: Degenerative changes of the spine. Grade 1 anterolisthesis of L4 on L5. No acute or suspicious bone lesion is seen. Lucent lesion in the left femoral neck is unchanged. IMPRESSION: 1. Status post gastric bypass with distension of the gastric pouch by fluid and food bolus. There is some extension of the food bolus into the jejunal limb. No dilated small bowel distal to this is suggest obstruction. 2. Interim development of multiple hypo dense liver masses, concerning for metastatic disease or liver primary. Follow-up contrast-enhanced CT and or MRI is recommended. 3. Enlarged gastrohepatic and porta hepatis nodes are concerning for metastatic disease. 4. Staghorn calculus on the left.  No hydronephrosis. Electronically Signed   By: Donavan Foil M.D.   On: 03/20/2017 03:59   Dg Neck Soft Tissue  Result Date: 03/20/2017 CLINICAL DATA:  Feels like food stuck in the throat. Shortness of breath. EXAM: NECK SOFT TISSUES - 1+ VIEW COMPARISON:  Cervical spine 09/24/2013 FINDINGS: There is no evidence of retropharyngeal soft tissue swelling or epiglottic enlargement. The cervical airway is unremarkable  and no radio-opaque foreign body identified. Postoperative changes with anterior and posterior fixation of the cervical spine. IMPRESSION: No radiopaque soft tissue foreign bodies are identified. Postoperative changes in the cervical spine. Electronically Signed   By: Lucienne Capers M.D.   On: 03/20/2017 02:34   Dg Chest 2 View  Result Date: 03/20/2017 CLINICAL DATA:  Feels like food stuck in the throat. Shortness of breath. EXAM: CHEST  2 VIEW  COMPARISON:  07/10/2015 FINDINGS: Mild cardiac enlargement. No pulmonary vascular congestion or edema. Peribronchial thickening with central interstitial changes suggesting chronic bronchitis. Linear scarring in the left mid lung. No focal consolidation. No blunting of costophrenic angles. No pneumothorax. Mediastinal contours appear intact. Postoperative changes in the cervical spine. Degenerative changes in the thoracic spine. No apparent esophageal dilatation. IMPRESSION: Cardiac enlargement. Chronic bronchitic changes in the lungs. No evidence of active pulmonary disease. Electronically Signed   By: Lucienne Capers M.D.   On: 03/20/2017 02:35   Nm Pulmonary Perf And Vent  Result Date: 03/20/2017 CLINICAL DATA:  Shortness of Breath EXAM: NUCLEAR MEDICINE VENTILATION - PERFUSION LUNG SCAN VIEWS: Anterior, posterior, left lateral, right lateral, RAO, LAO, RPO, LPO -ventilation and perfusion RADIOPHARMACEUTICALS:  32.8 mCi Technetium-35m DTPA aerosol inhalation and 4.23 mCi Technetium-42m MAA IV COMPARISON:  Chest radiograph March 20 2017 FINDINGS: Ventilation: Radiotracer uptake on the ventilation study is homogeneous and symmetric. There are no appreciable ventilation defects. Perfusion: Radiotracer uptake on the perfusion study is homogeneous and symmetric. There are no appreciable perfusion defects. IMPRESSION: No appreciable ventilation or perfusion defects. This study constitutes a very low probability of pulmonary embolus. Electronically Signed   By:  Lowella Grip III M.D.   On: 03/20/2017 14:55    Assessment and plan- Patient is a 82 y.o. female who presented with dysphagia anf weight loss.   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 Awaiting pathology.  Outpatient follow up with me to discuss about pathology results and additional work up.  Plan outpatient PET scan and US guided liver biopsy.    Dr. Earlie Server, MD, PhD [facility] Pager- [phone] 03/22/2017

## 2017-03-22 NOTE — Discharge Instructions (Signed)
Follow-up with primary care physician in 5-7 days Follow-up with gastroenterology Dr. Vira Agar in 5-7 days Follow-up with oncology Dr. Tasia Catchings 3-5 days Patient needs outpatient liver biopsy ultrasound-guided once hemoglobin is stable

## 2017-03-23 ENCOUNTER — Other Ambulatory Visit: Payer: Self-pay | Admitting: Oncology

## 2017-03-23 DIAGNOSIS — K228 Other specified diseases of esophagus: Secondary | ICD-10-CM

## 2017-03-23 DIAGNOSIS — K2289 Other specified disease of esophagus: Secondary | ICD-10-CM

## 2017-03-23 LAB — CEA: CEA1: 96.6 ng/mL — AB (ref 0.0–4.7)

## 2017-03-23 LAB — AFP TUMOR MARKER: AFP, Serum, Tumor Marker: 1.5 ng/mL (ref 0.0–8.3)

## 2017-03-23 LAB — CANCER ANTIGEN 19-9: CAN 19-9: 2 U/mL (ref 0–35)

## 2017-03-24 ENCOUNTER — Encounter: Payer: Self-pay | Admitting: Gastroenterology

## 2017-03-24 ENCOUNTER — Telehealth: Payer: Self-pay | Admitting: *Deleted

## 2017-03-24 ENCOUNTER — Telehealth: Payer: Self-pay | Admitting: Oncology

## 2017-03-24 NOTE — Telephone Encounter (Signed)
Hosp follow up for.Esophageal mass, biopsied.  Appt schd with Dr Tasia Catchings and PET for 03/28/17. Appts conf with patient. Patient advised NPO 6 hrs prior to PET at 9 a.m.

## 2017-03-24 NOTE — Telephone Encounter (Signed)
Transition Care Management Follow-up Telephone Call   Date discharged? 03/22/2017   How have you been since you were released from the hospital? "in shock" (Pt Dx with stomach cancer)   Do you understand why you were in the hospital? yes   Do you understand the discharge instructions? yes   Where were you discharged to? home   Items Reviewed:  Medications reviewed: yes; avoid NSAIDS  Allergies reviewed: yes  Dietary changes reviewed: yes  Referrals reviewed: yes   Functional Questionnaire:   Activities of Daily Living (ADLs):   She states they are independent in the following: pt independent in all areas    Any transportation issues/concerns?: no   Any patient concerns? no   Confirmed importance and date/time of follow-up visits scheduled yes  Provider Appointment booked with [facility] 11/15  Confirmed with patient if condition begins to worsen call PCP or go to the ER.  Patient was given the office number and encouraged to call back with question or concerns.  : yes

## 2017-03-25 ENCOUNTER — Other Ambulatory Visit: Payer: Self-pay | Admitting: Pathology

## 2017-03-26 ENCOUNTER — Other Ambulatory Visit: Payer: Self-pay | Admitting: Oncology

## 2017-03-26 ENCOUNTER — Telehealth: Payer: Self-pay | Admitting: *Deleted

## 2017-03-26 NOTE — Telephone Encounter (Signed)
Patient called reporting that she has been taken off of all her arthritis medications , Meloxicam, Aleve, ASA and Tylenol is NOT relieving her pain. She takes Tramadol 25 mg in the morning and again at noon every day, but it is not relieving pain either. Please advise what she can take for pain.

## 2017-03-26 NOTE — Telephone Encounter (Signed)
Per VO Dr Tasia Catchings, patient can take Tramadol 50 mg every 8 hours as needed and will refill her current prescription if needed. Patient informed and repeated back to me

## 2017-03-27 ENCOUNTER — Ambulatory Visit: Payer: Medicare Other | Admitting: Family Medicine

## 2017-03-27 ENCOUNTER — Encounter: Payer: Self-pay | Admitting: Family Medicine

## 2017-03-27 VITALS — BP 140/68 | HR 73 | Temp 98.3°F | Wt 236.2 lb

## 2017-03-27 DIAGNOSIS — D649 Anemia, unspecified: Secondary | ICD-10-CM

## 2017-03-27 DIAGNOSIS — C155 Malignant neoplasm of lower third of esophagus: Secondary | ICD-10-CM

## 2017-03-27 LAB — CBC WITH DIFFERENTIAL/PLATELET
BASOS ABS: 0.1 10*3/uL (ref 0.0–0.1)
Basophils Relative: 0.8 % (ref 0.0–3.0)
Eosinophils Absolute: 0.3 10*3/uL (ref 0.0–0.7)
Eosinophils Relative: 4 % (ref 0.0–5.0)
HEMATOCRIT: 27.6 % — AB (ref 36.0–46.0)
Hemoglobin: 9 g/dL — ABNORMAL LOW (ref 12.0–15.0)
LYMPHS PCT: 23.1 % (ref 12.0–46.0)
Lymphs Abs: 1.8 10*3/uL (ref 0.7–4.0)
MCHC: 32.5 g/dL (ref 30.0–36.0)
MCV: 95 fl (ref 78.0–100.0)
MONOS PCT: 7.6 % (ref 3.0–12.0)
Monocytes Absolute: 0.6 10*3/uL (ref 0.1–1.0)
Neutro Abs: 5 10*3/uL (ref 1.4–7.7)
Neutrophils Relative %: 64.5 % (ref 43.0–77.0)
Platelets: 259 10*3/uL (ref 150.0–400.0)
RBC: 2.91 Mil/uL — AB (ref 3.87–5.11)
RDW: 13.8 % (ref 11.5–15.5)
WBC: 7.8 10*3/uL (ref 4.0–10.5)

## 2017-03-27 LAB — BASIC METABOLIC PANEL
BUN: 11 mg/dL (ref 6–23)
CALCIUM: 9.2 mg/dL (ref 8.4–10.5)
CHLORIDE: 104 meq/L (ref 96–112)
CO2: 30 meq/L (ref 19–32)
Creatinine, Ser: 0.69 mg/dL (ref 0.40–1.20)
GFR: 86.94 mL/min (ref >60.00)
Glucose, Bld: 100 mg/dL — ABNORMAL HIGH (ref 70–99)
Potassium: 4.2 mEq/L (ref 3.5–5.1)
SODIUM: 141 meq/L (ref 135–145)

## 2017-03-27 MED ORDER — OXYBUTYNIN CHLORIDE 5 MG PO TABS: ORAL_TABLET | ORAL | Status: DC

## 2017-03-27 NOTE — Progress Notes (Signed)
Hospital course d/w pt.  1. Abdominal pain could be related to underlying process that is noted on the CT scan Abdominal pain resolved EGD with esophageal mass status post biopsy done okay to discharge patient from GI standpoint.  Tolerating diet Outpatient GI follow-up  2. Hypoxia of the etiology unclear chest x-ray is negative VQ scan negative Patient now on room air  3. Liver lesion as well as intra-abdominal lymph node enlargements Oncology dr.Yu seen pt, patient follow-up with oncology Patient will need outpatient liver biopsy Patient wants to get IV iron transfusion as an outpatient as well  4.Anemia her hemoglobin has trended down since August I will check anemia panel I do not think that she has acute GI bleed transfuse if less than 7 Refused IV iron transfusion during the hospital course.  Patient prefers getting that as an outpatient during oncology follow-up. We will give her iron supplements with stool softeners   ===================================== She went to ER with abd pain after eating, NAV.  Liver masses noted on imaging.  bx with ESOPHAGEAL TUMOR - MODERATELY DIFFERENTIATED ADENOCARCINOMA.  She was discharged home with f/u pending.    Higher dose of tramadol is helping with pain.  She is on iron, now with black stools.  She is on a stool softener daily, d/w pt about taking prn miralax, see AVS.   She is doing better with oxybutynin 1/2 tab in the AM and 1 tab in the PM. That works better for patient.   She had her L shoulder injected by ortho, but still with pain.    She is dealing with the shock of the dx but able to eat, is fatigued, but is able to "get by" so far.  Path and w/u and pending w/u d/w pt.    She has PET scheduled.   PMH and SH reviewed  ROS: Per HPI unless specifically indicated in ROS section   Meds, vitals, and allergies reviewed.   GEN: nad, alert and oriented HEENT: mucous membranes moist NECK: supple w/o LA CV: rrr.   PULM: ctab, no inc wob ABD: soft, +bs EXT: no edema SKIN: no acute rash

## 2017-03-27 NOTE — Patient Instructions (Addendum)
You can take 2 senokot a day and/or add on miralax.  Go to the lab on the way out.  We'll contact you with your lab report. Take care.  Glad to see you.  Update me as needed.

## 2017-03-28 ENCOUNTER — Ambulatory Visit
Admission: RE | Admit: 2017-03-28 | Discharge: 2017-03-28 | Disposition: A | Discharge status: Home / Self Care | Payer: Medicare Other | Source: Ambulatory Visit | Attending: Oncology | Admitting: Oncology

## 2017-03-28 ENCOUNTER — Other Ambulatory Visit: Payer: Self-pay

## 2017-03-28 ENCOUNTER — Encounter: Payer: Self-pay | Admitting: Oncology

## 2017-03-28 ENCOUNTER — Inpatient Hospital Stay: Payer: Medicare Other | Attending: Oncology | Admitting: Oncology

## 2017-03-28 VITALS — BP 124/61 | HR 61 | Temp 99.4°F | Ht 62.25 in | Wt 234.2 lb

## 2017-03-28 DIAGNOSIS — I517 Cardiomegaly: Secondary | ICD-10-CM | POA: Insufficient documentation

## 2017-03-28 DIAGNOSIS — Z79899 Other long term (current) drug therapy: Secondary | ICD-10-CM | POA: Insufficient documentation

## 2017-03-28 DIAGNOSIS — Z87891 Personal history of nicotine dependence: Secondary | ICD-10-CM | POA: Insufficient documentation

## 2017-03-28 DIAGNOSIS — R5381 Other malaise: Secondary | ICD-10-CM | POA: Diagnosis not present

## 2017-03-28 DIAGNOSIS — R1013 Epigastric pain: Secondary | ICD-10-CM | POA: Diagnosis not present

## 2017-03-28 DIAGNOSIS — R634 Abnormal weight loss: Secondary | ICD-10-CM | POA: Diagnosis not present

## 2017-03-28 DIAGNOSIS — Z9884 Bariatric surgery status: Secondary | ICD-10-CM | POA: Diagnosis not present

## 2017-03-28 DIAGNOSIS — Z9889 Other specified postprocedural states: Secondary | ICD-10-CM | POA: Insufficient documentation

## 2017-03-28 DIAGNOSIS — K769 Liver disease, unspecified: Secondary | ICD-10-CM | POA: Diagnosis not present

## 2017-03-28 DIAGNOSIS — I2510 Atherosclerotic heart disease of native coronary artery without angina pectoris: Secondary | ICD-10-CM | POA: Insufficient documentation

## 2017-03-28 DIAGNOSIS — N200 Calculus of kidney: Secondary | ICD-10-CM | POA: Insufficient documentation

## 2017-03-28 DIAGNOSIS — K228 Other specified diseases of esophagus: Secondary | ICD-10-CM

## 2017-03-28 DIAGNOSIS — K229 Disease of esophagus, unspecified: Secondary | ICD-10-CM | POA: Diagnosis not present

## 2017-03-28 DIAGNOSIS — M1990 Unspecified osteoarthritis, unspecified site: Secondary | ICD-10-CM | POA: Insufficient documentation

## 2017-03-28 DIAGNOSIS — Z9989 Dependence on other enabling machines and devices: Secondary | ICD-10-CM

## 2017-03-28 DIAGNOSIS — R5383 Other fatigue: Secondary | ICD-10-CM | POA: Diagnosis not present

## 2017-03-28 DIAGNOSIS — R160 Hepatomegaly, not elsewhere classified: Secondary | ICD-10-CM | POA: Diagnosis not present

## 2017-03-28 DIAGNOSIS — Z800 Family history of malignant neoplasm of digestive organs: Secondary | ICD-10-CM

## 2017-03-28 DIAGNOSIS — C155 Malignant neoplasm of lower third of esophagus: Secondary | ICD-10-CM | POA: Diagnosis not present

## 2017-03-28 DIAGNOSIS — Z884 Allergy status to anesthetic agent status: Secondary | ICD-10-CM

## 2017-03-28 DIAGNOSIS — Z803 Family history of malignant neoplasm of breast: Secondary | ICD-10-CM | POA: Insufficient documentation

## 2017-03-28 DIAGNOSIS — G4733 Obstructive sleep apnea (adult) (pediatric): Secondary | ICD-10-CM | POA: Diagnosis not present

## 2017-03-28 DIAGNOSIS — Z85820 Personal history of malignant melanoma of skin: Secondary | ICD-10-CM

## 2017-03-28 DIAGNOSIS — N3281 Overactive bladder: Secondary | ICD-10-CM | POA: Diagnosis not present

## 2017-03-28 DIAGNOSIS — G8929 Other chronic pain: Secondary | ICD-10-CM | POA: Insufficient documentation

## 2017-03-28 DIAGNOSIS — Z7189 Other specified counseling: Secondary | ICD-10-CM

## 2017-03-28 DIAGNOSIS — I700 Atherosclerosis of aorta: Secondary | ICD-10-CM | POA: Diagnosis not present

## 2017-03-28 DIAGNOSIS — R918 Other nonspecific abnormal finding of lung field: Secondary | ICD-10-CM | POA: Insufficient documentation

## 2017-03-28 DIAGNOSIS — K2289 Other specified disease of esophagus: Secondary | ICD-10-CM

## 2017-03-28 LAB — GLUCOSE, CAPILLARY: GLUCOSE-CAPILLARY: 101 mg/dL — AB (ref 65–99)

## 2017-03-28 MED ORDER — FLUDEOXYGLUCOSE F - 18 (FDG) INJECTION
12.1800 | Freq: Once | INTRAVENOUS | Status: AC | PRN
  Administered 2017-03-28: 12.18 via INTRAVENOUS

## 2017-03-28 NOTE — Progress Notes (Addendum)
Hematology/Oncology Follow Up Note [facility]  Telephone:([phone] [phone]  Patient Care Team: Tonia Ghent, MD as PCP - General (Family Medicine)   Name of the patient: Monquie Fulgham  [id number]  10/23/1934   REASON FOR VISIT Follow up of management of esophageal adenocarcinoma  HISTORY OF PRESENT ILLNESS Patient is a 82 year old female with past medical history listed as below who presented with complaints of epigastric discomfort, and sensation of food being stuck. Patient also reports that epigastric pain is aching in nature, and intermittent, for about several months. Associated with weight loss about 20 pounds in the past year. Patient also felt shortness of breath and in the emergency room oxygen saturation will at 83%. CT abdomen showed multiple masses in the liver, unknown etiology. EGD during admission showed partially obstructive esophageal mass at the distal part of esophagus. Biopsy was taken.  Pathology showed esophageal adenocarcinoma   INTERVAL HISTORY Patient presents to discuss pathology results. Patient's son who is a physical therapist accompnaied patient today. Sister, brother and sister in law were also present in the room.  Report family history of breast cancer, but she has been having routine mammograms. Lives alone. She's a former smoker. She reports having no difficulty drinking liquid. Able to eat semi solids.  She has chronic arthritis pain for which she uses to take NSAIDS. Due to the esophageal finding, she was told not take and NSAIDS however she feels arthritic pain is not well controlled with tylenol.  She called cancer center and I increased her Tramadol to 57m Q6h PRN. She feels pain is well controlled today.    Review of systems Review of Systems  Constitutional: Positive for malaise/fatigue and weight loss. Negative for chills and fever.  HENT: Negative for hearing loss.   Eyes: Negative for blurred  vision.  Respiratory: Negative for cough.   Cardiovascular: Negative for chest pain.  Gastrointestinal: Negative for nausea and vomiting.       Dysphagia   Genitourinary: Negative for dysuria.  Musculoskeletal: Negative for myalgias.  Skin: Negative for rash.  Neurological: Negative for dizziness.  Endo/Heme/Allergies: Does not bruise/bleed easily.  Psychiatric/Behavioral: Negative for depression.    Allergies  Allergen Reactions  . Codeine Nausea Only  . Doxycycline Other (See Comments)    Lip swelling  . Gabapentin Other (See Comments)    Swelling.   . Iohexol Itching    07-15-13 pt developed itching on fingers after contrast given. Dr. OIrish Elderslooked at pt and said to put in system as allergy. BB  . Other Other (See Comments)    Contrast Dye- caused fever, chills, awful feeling  . Oxycodone Other (See Comments)    nausea  . Quinolones Swelling    Lip swelling  . Synvisc [Hylan G-F 20]      Past Medical History:  Diagnosis Date  . Arthritis   . Asthma   . Dysphonia   . Hypertension   . [place]   . OAB (overactive bladder)   . OSA on CPAP   . 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 Pupil asymmetry    R pupil defect  . Sleep apnea   . Spinal cord abscess      Past Surgical History:  Procedure Laterality Date  . ANTERIOR CERVICAL DECOMP/DISCECTOMY FUSION N/A 07/16/2013   Procedure: ANTERIOR CERVICAL DECOMPRESSION/DISCECTOMY FUSION mutlipleLEVELS C4-7;  Surgeon: JErline Levine MD;  [facility];  Service: Neurosurgery;  Laterality: N/A;  .  APPENDECTOMY    . CATARACT EXTRACTION    . CHOLECYSTECTOMY    . dental implant    . ESOPHAGOGASTRODUODENOSCOPY Left 03/21/2017   Procedure: ESOPHAGOGASTRODUODENOSCOPY (EGD);  Surgeon: Virgel Manifold, MD;  Location: [facility] ENDOSCOPY;  Service: Endoscopy;  Laterality: Left;  Marland Kitchen GASTRIC BYPASS    . HIATAL HERNIA REPAIR    . MELANOMA EXCISION    . POSTERIOR CERVICAL FUSION/FORAMINOTOMY N/A  07/28/2013   Procedure: Cervical four-seven  posterior cervical fusion;  Surgeon: Erline Levine, MD;  Location: [place] [facility];  Service: Neurosurgery;  Laterality: N/A;  . TONSILLECTOMY    . TOTAL KNEE ARTHROPLASTY      Social History   Socioeconomic History  . Marital status: Widowed    Spouse name: Not on file  . Number of children: 2  . Years of education: Not on file  . Highest education level: Master's degree (e.g., MA, MS, MEng, MEd, MSW, MBA)  Social Needs  . Financial resource strain: Not hard at all  . Food insecurity - worry: Never true  . Food insecurity - inability: Never true  . Transportation needs - medical: No  . Transportation needs - non-medical: No  Occupational History  . Not on file  Tobacco Use  . Smoking status: Former Research scientist (life sciences)  . Smokeless tobacco: Never Used  Substance and Sexual Activity  . Alcohol use: No  . Drug use: No  . Sexual activity: No  Other Topics Concern  . Not on file  Social History Narrative   Marital Status- Widowed    Lives by herself    Employement- Retired Pharmacist, hospital   Exercise hx- Does PT     Family History  Problem Relation Age of Onset  . Breast cancer Mother 39  . Breast cancer Sister 64  . Colon cancer Neg Hx      Current Outpatient Medications:  .  acetaminophen (TYLENOL) 325 MG tablet, Take 650 mg by mouth every 6 (six) hours as needed., Disp: , Rfl:  .  calcium carbonate (TUMS EX) 750 MG chewable tablet, Chew 1 tablet by mouth daily., Disp: , Rfl:  .  Cholecalciferol (VITAMIN D) 2000 UNITS CAPS, Take 2,000 Units by mouth daily., Disp: , Rfl:  .  cyanocobalamin 500 MCG tablet, Take 500 mcg every other day by mouth., Disp: , Rfl:  .  ferrous sulfate 325 (65 FE) MG EC tablet, Take 1 tablet (325 mg total) 2 (two) times daily by mouth., Disp: 60 tablet, Rfl: 3 .  fluticasone (FLONASE) 50 MCG/ACT nasal spray, Place 1 spray into both nostrils 2 (two) times daily. , Disp: , Rfl:  .  loratadine (CLARITIN) 10 MG tablet, Take 10  mg by mouth daily., Disp: , Rfl:  .  LYRICA 150 MG capsule, TAKE 1 CAPSULE BY MOUTH TWO TIMES DAILY, Disp: 180 capsule, Rfl: 1 .  metoprolol succinate (TOPROL-XL) 50 MG 24 hr tablet, TAKE 1 TABLET BY MOUTH DAILY, Disp: 90 tablet, Rfl: 3 .  oxybutynin (DITROPAN) 5 MG tablet, TAKE 1/2 TABLET BY MOUTH IN THE MORNING AND 1 TABLET AT BEDTIME, Disp: , Rfl:  .  senna-docusate (SENOKOT-S) 8.6-50 MG tablet, Take 1 tablet at bedtime as needed by mouth for mild constipation., Disp: , Rfl:  .  traMADol (ULTRAM) 50 MG tablet, Take 50 mg 3 (three) times daily as needed by mouth., Disp: , Rfl:  .  traZODone (DESYREL) 50 MG tablet, Take 25 mg at bedtime as needed by mouth for sleep. Take 0.5 tablet (25 mg total) at  bedtime, Disp: , Rfl:   Physical exam:  Vitals:   03/28/17 1528  BP: 124/61  Pulse: 61  Temp: 99.4 F (37.4 C)  TempSrc: Tympanic  Weight: 234 lb 4 oz (106.3 kg)  Height: 5' 2.25" (1.581 m)   Physical Exam  Constitutional: She is oriented to person, place, and time and well-developed, well-nourished, and in no distress. No distress.  HENT:  Head: Normocephalic and atraumatic.  Eyes: EOM are normal. Pupils are equal, round, and reactive to light.  Neck: Normal range of motion. Neck supple.  Cardiovascular: Normal rate and regular rhythm.  Pulmonary/Chest: Effort normal and breath sounds normal.  Abdominal: Soft. Bowel sounds are normal.  Musculoskeletal: Normal range of motion.  Neurological: She is alert and oriented to person, place, and time.  Skin: Skin is warm and dry.  Psychiatric: Affect normal.    CMP Latest Ref Rng & Units 03/27/2017  Glucose 70 - 99 mg/dL 100(H)  BUN 6 - 23 mg/dL 11  Creatinine 0.40 - 1.20 mg/dL 0.69  Sodium 135 - 145 mEq/L 141  Potassium 3.5 - 5.1 mEq/L 4.2  Chloride 96 - 112 mEq/L 104  CO2 19 - 32 mEq/L 30  Calcium 8.4 - 10.5 mg/dL 9.2  Total Protein 6.5 - 8.1 g/dL -  Total Bilirubin 0.3 - 1.2 mg/dL -  Alkaline Phos 38 - 126 U/L -  AST 15 - 41 U/L  -  ALT 14 - 54 U/L -   CBC Latest Ref Rng & Units 03/27/2017  WBC 4.0 - 10.5 K/uL 7.8  Hemoglobin 12.0 - 15.0 g/dL 9.0 Repeated and verified X2.(L)  Hematocrit 36.0 - 46.0 % 27.6(L)  Platelets 150.0 - 400.0 K/uL 259.0    Ct Abdomen Pelvis Wo Contrast  Result Date: 03/20/2017 CLINICAL DATA:  foreign body sensation EXAM: CT ABDOMEN AND PELVIS WITHOUT CONTRAST TECHNIQUE: Multidetector CT imaging of the abdomen and pelvis was performed following the standard protocol without IV contrast. COMPARISON:  06/29/2015 FINDINGS: Lower chest: Lung bases demonstrate no acute consolidation or pleural effusion. Mild cardiomegaly. Mitral calcifications. Hepatobiliary: Interim development of multiple hypodense liver masses. Question slight nodularity of the liver contour. Surgical absence of the gallbladder. No biliary dilatation. Pancreas: Unremarkable. No pancreatic ductal dilatation or surrounding inflammatory changes. Spleen: Normal in size without focal abnormality. Adrenals/Urinary Tract: Adrenal glands are within normal limits. Staghorn calculus in the lower pole of the left kidney measuring up to 17 mm. No hydronephrosis. Bladder normal. Stomach/Bowel: Status post gastric bypass. Distended gastric pouch filled with fluid and food material which extends into the jejunal limb. No dilated small bowel distal to this is suggest obstruction. No colon wall thickening. Vascular/Lymphatic: Aortic atherosclerosis. Enlarged gastrohepatic lymph node measuring 14 mm. Additional prominent porta hepatis lymph nodes. Reproductive: Status post hysterectomy. No adnexal masses. Other: Negative for free air or free fluid. Musculoskeletal: Degenerative changes of the spine. Grade 1 anterolisthesis of L4 on L5. No acute or suspicious bone lesion is seen. Lucent lesion in the left femoral neck is unchanged. IMPRESSION: 1. Status post gastric bypass with distension of the gastric pouch by fluid and food bolus. There is some extension of  the food bolus into the jejunal limb. No dilated small bowel distal to this is suggest obstruction. 2. Interim development of multiple hypo dense liver masses, concerning for metastatic disease or liver primary. Follow-up contrast-enhanced CT and or MRI is recommended. 3. Enlarged gastrohepatic and porta hepatis nodes are concerning for metastatic disease. 4. Staghorn calculus on the left.  No hydronephrosis.  Electronically Signed   By: Donavan Foil M.D.   On: 03/20/2017 03:59   Dg Neck Soft Tissue  Result Date: 03/20/2017 CLINICAL DATA:  Feels like food stuck in the throat. Shortness of breath. EXAM: NECK SOFT TISSUES - 1+ VIEW COMPARISON:  Cervical spine 09/24/2013 FINDINGS: There is no evidence of retropharyngeal soft tissue swelling or epiglottic enlargement. The cervical airway is unremarkable and no radio-opaque foreign body identified. Postoperative changes with anterior and posterior fixation of the cervical spine. IMPRESSION: No radiopaque soft tissue foreign bodies are identified. Postoperative changes in the cervical spine. Electronically Signed   By: Lucienne Capers M.D.   On: 03/20/2017 02:34   Dg Chest 2 View  Result Date: 03/20/2017 CLINICAL DATA:  Feels like food stuck in the throat. Shortness of breath. EXAM: CHEST  2 VIEW COMPARISON:  07/10/2015 FINDINGS: Mild cardiac enlargement. No pulmonary vascular congestion or edema. Peribronchial thickening with central interstitial changes suggesting chronic bronchitis. Linear scarring in the left mid lung. No focal consolidation. No blunting of costophrenic angles. No pneumothorax. Mediastinal contours appear intact. Postoperative changes in the cervical spine. Degenerative changes in the thoracic spine. No apparent esophageal dilatation. IMPRESSION: Cardiac enlargement. Chronic bronchitic changes in the lungs. No evidence of active pulmonary disease. Electronically Signed   By: Lucienne Capers M.D.   On: 03/20/2017 02:35   Nm Pulmonary Perf  And Vent  Result Date: 03/20/2017 CLINICAL DATA:  Shortness of Breath EXAM: NUCLEAR MEDICINE VENTILATION - PERFUSION LUNG SCAN VIEWS: Anterior, posterior, left lateral, right lateral, RAO, LAO, RPO, LPO -ventilation and perfusion RADIOPHARMACEUTICALS:  32.8 mCi Technetium-10mDTPA aerosol inhalation and 4.23 mCi Technetium-990mAA IV COMPARISON:  Chest radiograph March 20 2017 FINDINGS: Ventilation: Radiotracer uptake on the ventilation study is homogeneous and symmetric. There are no appreciable ventilation defects. Perfusion: Radiotracer uptake on the perfusion study is homogeneous and symmetric. There are no appreciable perfusion defects. IMPRESSION: No appreciable ventilation or perfusion defects. This study constitutes a very low probability of pulmonary embolus. Electronically Signed   By: WiLowella GripII M.D.   On: 03/20/2017 14:55     Assessment and plan  1. Malignant neoplasm of lower third of esophagus (HCC)   2. Liver lesion   3. Goals of care, counseling/discussion    Pathology and image results were discussed with patient and her family members,  # recommend USKoreaiopsy of liver lesion to proof distant metastasis.  # clinically stage IV disease, goal of care discussed. Palliative chemotherapy with FOLFOX is offered.   I explained to the patient the risks and benefits of chemotherapy including all but not limited to hair loss, mouth sore, nausea, vomiting, low blood counts, bleeding, and risk of life threatening infection and even death, secondary malignancy etc.  Risk of oxaliplatin is associated with neuropathy. Patient voices understanding and willing to proceed with chemotherapy.  # Chemotherapy education; refer to vascular surgeon for Medi port placement. Hopefully the planned start chemotherapy next week. Antiemetics-Zofran and Compazine; EMLA cream sent to pharmacy # Nutrition consult # Plan IV iron with Venofer [id number] for 4 doses. Allergy reactions/infusion reaction  including anaphylactic reaction discussed with patient. Patient voices understanding and willing to proceed. # refer to radonc for palliative RT # send foundation one test and HER2 staining. .  Earlie ServerD, PhD Hematology [facility]cology [facility] [phone]/16/2018

## 2017-03-28 NOTE — Progress Notes (Signed)
Patient here today as a new patient  

## 2017-03-29 DIAGNOSIS — Z7185 Encounter for immunization safety counseling: Secondary | ICD-10-CM | POA: Insufficient documentation

## 2017-03-29 DIAGNOSIS — C155 Malignant neoplasm of lower third of esophagus: Secondary | ICD-10-CM | POA: Insufficient documentation

## 2017-03-29 DIAGNOSIS — Z7189 Other specified counseling: Secondary | ICD-10-CM | POA: Insufficient documentation

## 2017-03-29 NOTE — Progress Notes (Signed)
START ON PATHWAY REGIMEN - Gastroesophageal     A cycle is every 14 days:     Oxaliplatin      Leucovorin      5-Fluorouracil      5-Fluorouracil   **Always confirm dose/schedule in your pharmacy ordering system**    Patient Characteristics: Distant Metastases (cM1/pM1) / Locally Recurrent Disease, Adenocarcinoma - Esophageal, GE Junction, and Gastric, First Line, HER2 Negative / Unknown Histology: Adenocarcinoma Disease Classification: Esophageal Therapeutic Status: Distant Metastases (No Additional Staging) Would you be surprised if this patient died  in the next year<= I would be surprised if this patient died in the next year Line of Therapy: First Line HER2 Status: Awaiting Test Results Intent of Therapy: Non-Curative / Palliative Intent, Discussed with Patient

## 2017-03-31 ENCOUNTER — Encounter (INDEPENDENT_AMBULATORY_CARE_PROVIDER_SITE_OTHER): Payer: Self-pay

## 2017-03-31 ENCOUNTER — Other Ambulatory Visit (INDEPENDENT_AMBULATORY_CARE_PROVIDER_SITE_OTHER): Payer: Self-pay | Admitting: Vascular Surgery

## 2017-03-31 NOTE — Assessment & Plan Note (Signed)
We talked about her situation, with anemia.  No further bleeding noted.  Only had black stools when she started taking iron.  Recheck labs today.  PET scan is pending.  At this point she is okay for outpatient follow-up.  She understands her diagnosis and her pending workup.  I appreciate the help of all involved.  See notes on labs from today. >25 minutes spent in face to face time with patient, >50% spent in counselling or coordination of care.

## 2017-04-01 ENCOUNTER — Encounter: Payer: Self-pay | Admitting: Oncology

## 2017-04-06 MED ORDER — CLINDAMYCIN PHOSPHATE 300 MG/50ML IV SOLN: 300.0000 mg | Freq: Once | INTRAVENOUS | Status: DC

## 2017-04-07 ENCOUNTER — Encounter
Admission: RE | Disposition: A | Discharge status: Home / Self Care | Payer: Self-pay | Source: Ambulatory Visit | Attending: Vascular Surgery

## 2017-04-07 ENCOUNTER — Encounter: Payer: Self-pay | Admitting: *Deleted

## 2017-04-07 ENCOUNTER — Ambulatory Visit
Admission: RE | Admit: 2017-04-07 | Discharge: 2017-04-07 | Disposition: A | Discharge status: Home / Self Care | Payer: Medicare Other | Source: Ambulatory Visit | Attending: Vascular Surgery | Admitting: Vascular Surgery

## 2017-04-07 DIAGNOSIS — Z87891 Personal history of nicotine dependence: Secondary | ICD-10-CM | POA: Insufficient documentation

## 2017-04-07 DIAGNOSIS — Z96659 Presence of unspecified artificial knee joint: Secondary | ICD-10-CM | POA: Insufficient documentation

## 2017-04-07 DIAGNOSIS — N3281 Overactive bladder: Secondary | ICD-10-CM | POA: Diagnosis not present

## 2017-04-07 DIAGNOSIS — Z9049 Acquired absence of other specified parts of digestive tract: Secondary | ICD-10-CM | POA: Diagnosis not present

## 2017-04-07 DIAGNOSIS — Z881 Allergy status to other antibiotic agents status: Secondary | ICD-10-CM | POA: Diagnosis not present

## 2017-04-07 DIAGNOSIS — Z91041 Radiographic dye allergy status: Secondary | ICD-10-CM | POA: Insufficient documentation

## 2017-04-07 DIAGNOSIS — Z79899 Other long term (current) drug therapy: Secondary | ICD-10-CM | POA: Diagnosis not present

## 2017-04-07 DIAGNOSIS — Z9849 Cataract extraction status, unspecified eye: Secondary | ICD-10-CM | POA: Diagnosis not present

## 2017-04-07 DIAGNOSIS — Z85820 Personal history of malignant melanoma of skin: Secondary | ICD-10-CM | POA: Insufficient documentation

## 2017-04-07 DIAGNOSIS — K769 Liver disease, unspecified: Secondary | ICD-10-CM | POA: Diagnosis not present

## 2017-04-07 DIAGNOSIS — M1990 Unspecified osteoarthritis, unspecified site: Secondary | ICD-10-CM | POA: Insufficient documentation

## 2017-04-07 DIAGNOSIS — Z9884 Bariatric surgery status: Secondary | ICD-10-CM | POA: Insufficient documentation

## 2017-04-07 DIAGNOSIS — Z9889 Other specified postprocedural states: Secondary | ICD-10-CM | POA: Diagnosis not present

## 2017-04-07 DIAGNOSIS — J45909 Unspecified asthma, uncomplicated: Secondary | ICD-10-CM | POA: Diagnosis not present

## 2017-04-07 DIAGNOSIS — Z803 Family history of malignant neoplasm of breast: Secondary | ICD-10-CM | POA: Insufficient documentation

## 2017-04-07 DIAGNOSIS — Z888 Allergy status to other drugs, medicaments and biological substances status: Secondary | ICD-10-CM | POA: Insufficient documentation

## 2017-04-07 DIAGNOSIS — Z885 Allergy status to narcotic agent status: Secondary | ICD-10-CM | POA: Insufficient documentation

## 2017-04-07 DIAGNOSIS — C159 Malignant neoplasm of esophagus, unspecified: Secondary | ICD-10-CM

## 2017-04-07 DIAGNOSIS — I10 Essential (primary) hypertension: Secondary | ICD-10-CM | POA: Insufficient documentation

## 2017-04-07 DIAGNOSIS — Z981 Arthrodesis status: Secondary | ICD-10-CM | POA: Insufficient documentation

## 2017-04-07 DIAGNOSIS — C155 Malignant neoplasm of lower third of esophagus: Secondary | ICD-10-CM | POA: Diagnosis not present

## 2017-04-07 DIAGNOSIS — R0789 Other chest pain: Secondary | ICD-10-CM | POA: Diagnosis not present

## 2017-04-07 HISTORY — PX: PORTA CATH INSERTION: CATH118285

## 2017-04-07 SURGERY — PORTA CATH INSERTION
Anesthesia: Moderate Sedation

## 2017-04-07 MED ORDER — FENTANYL CITRATE (PF) 100 MCG/2ML IJ SOLN
INTRAMUSCULAR | Status: DC | PRN
  Administered 2017-04-07: 50 ug via INTRAVENOUS

## 2017-04-07 MED ORDER — HEPARIN (PORCINE) IN NACL 2-0.9 UNIT/ML-% IJ SOLN
INTRAMUSCULAR | Status: AC
  Filled 2017-04-07: qty 500

## 2017-04-07 MED ORDER — HEPARIN SODIUM (PORCINE) 10000 UNIT/ML IJ SOLN
INTRAMUSCULAR | Status: AC
  Filled 2017-04-07: qty 1

## 2017-04-07 MED ORDER — ONDANSETRON HCL 4 MG/2ML IJ SOLN: 4.0000 mg | Freq: Four times a day (QID) | INTRAMUSCULAR | Status: DC | PRN

## 2017-04-07 MED ORDER — MIDAZOLAM HCL 5 MG/5ML IJ SOLN
INTRAMUSCULAR | Status: AC
  Filled 2017-04-07: qty 5

## 2017-04-07 MED ORDER — FENTANYL CITRATE (PF) 100 MCG/2ML IJ SOLN
INTRAMUSCULAR | Status: AC
  Filled 2017-04-07: qty 2

## 2017-04-07 MED ORDER — CLINDAMYCIN PHOSPHATE 300 MG/50ML IV SOLN
INTRAVENOUS | Status: AC
  Filled 2017-04-07: qty 50

## 2017-04-07 MED ORDER — MIDAZOLAM HCL 2 MG/2ML IJ SOLN
INTRAMUSCULAR | Status: DC | PRN
  Administered 2017-04-07: 2 mg via INTRAVENOUS

## 2017-04-07 MED ORDER — SODIUM CHLORIDE 0.9 % IV SOLN: INTRAVENOUS | Status: DC

## 2017-04-07 MED ORDER — SODIUM CHLORIDE 0.9 % IV SOLN
INTRAVENOUS | Status: DC
  Administered 2017-04-07: 09:00:00 via INTRAVENOUS

## 2017-04-07 MED ORDER — GENTAMICIN SULFATE 40 MG/ML IJ SOLN
Freq: Once | INTRAMUSCULAR | Status: DC
  Filled 2017-04-07: qty 2

## 2017-04-07 MED ORDER — HYDROMORPHONE HCL 1 MG/ML IJ SOLN: 1.0000 mg | Freq: Once | INTRAMUSCULAR | Status: DC | PRN

## 2017-04-07 SURGICAL SUPPLY — 7 items
KIT PORT POWER 8FR ISP CVUE (Miscellaneous) ×3 IMPLANT
PACK ANGIOGRAPHY (CUSTOM PROCEDURE TRAY) ×3 IMPLANT
PAD GROUND ADULT SPLIT (MISCELLANEOUS) ×3 IMPLANT
PENCIL ELECTRO HAND CTR (MISCELLANEOUS) ×3 IMPLANT
SUT MNCRL AB 4-0 PS2 18 (SUTURE) ×3 IMPLANT
SUT PROLENE 0 CT 1 30 (SUTURE) ×3 IMPLANT
SUTURE VIC 3-0 (SUTURE) ×3 IMPLANT

## 2017-04-07 NOTE — H&P (Signed)
[facility] History & Physical Update  The patient was interviewed and re-examined.  The patient's previous History and Physical has been reviewed and is unchanged.  There is no change in the plan of care. We plan to proceed with the scheduled procedure.  Leotis Pain, MD  04/07/2017, 9:17 AM

## 2017-04-07 NOTE — Op Note (Signed)
      [facility]       Operative Note  Date: 04/07/2017  Preoperative diagnosis:  1. Esophageal cancer  Postoperative diagnosis:  Same as above  Procedures: #1. Ultrasound guidance for vascular access to the right internal jugular vein. #2. Fluoroscopic guidance for placement of catheter. #3. Placement of CT compatible Port-A-Cath, right internal jugular vein.  Surgeon: Leotis Pain, MD.   Anesthesia: Local with moderate conscious sedation for approximately 20  minutes using 2 mg of Versed and 50 mcg of Fentanyl  Fluoroscopy time: less than 1 minute  Contrast used: 0  Estimated blood loss: 25 cc  Indication for the procedure:  The patient is a 82 y.o.female with esophageal cancer.  The patient needs a Port-A-Cath for durable venous access, chemotherapy, lab draws, and CT scans. We are asked to place this. Risks and benefits were discussed and informed consent was obtained.  Description of procedure: The patient was brought to the vascular and interventional radiology suite.  Moderate conscious sedation was administered throughout the procedure during a face to face encounter with the patient with my supervision of the RN administering medicines and monitoring the patient's vital signs, pulse oximetry, telemetry and mental status throughout from the start of the procedure until the patient was taken to the recovery room. The right neck chest and shoulder were sterilely prepped and draped, and a sterile surgical field was created. Ultrasound was used to help visualize a patent right internal jugular vein. This was then accessed under direct ultrasound guidance without difficulty with the Seldinger needle and a permanent image was recorded. A J-wire was placed. After skin nick and dilatation, the peel-away sheath was then placed over the wire. I then anesthetized an area under the clavicle approximately 1-2 fingerbreadths. A transverse incision was created and an inferior  pocket was created with electrocautery and blunt dissection. The port was then brought onto the field, placed into the pocket and secured to the chest wall with 2 Prolene sutures. The catheter was connected to the port and tunneled from the subclavicular incision to the access site. Fluoroscopic guidance was then used to cut the catheter to an appropriate length. The catheter was then placed through the peel-away sheath and the peel-away sheath was removed. The catheter tip was parked in excellent location under fluorocoscopic guidance in the cavoatrial junction. The pocket was then irrigated with antibiotic impregnated saline and the wound was closed with a running 3-0 Vicryl and a 4-0 Monocryl. The access incision was closed with a single 4-0 Monocryl. The Huber needle was used to withdraw blood and flush the port with heparinized saline. Dermabond was then placed as a dressing. The patient tolerated the procedure well and was taken to the recovery room in stable condition.   Leotis Pain 04/07/2017 10:52 AM   This note was created with Dragon Medical transcription system. Any errors in dictation are purely unintentional.

## 2017-04-08 ENCOUNTER — Inpatient Hospital Stay: Payer: Medicare Other

## 2017-04-08 ENCOUNTER — Other Ambulatory Visit: Payer: Self-pay | Admitting: Physician Assistant

## 2017-04-08 VITALS — BP 126/56 | HR 56 | Temp 99.0°F | Resp 20

## 2017-04-08 DIAGNOSIS — C155 Malignant neoplasm of lower third of esophagus: Secondary | ICD-10-CM | POA: Diagnosis not present

## 2017-04-08 DIAGNOSIS — D500 Iron deficiency anemia secondary to blood loss (chronic): Secondary | ICD-10-CM

## 2017-04-08 MED ORDER — IRON SUCROSE 20 MG/ML IV SOLN
200.0000 mg | Freq: Once | INTRAVENOUS | Status: AC
  Administered 2017-04-08: 200 mg via INTRAVENOUS
  Filled 2017-04-08: qty 10

## 2017-04-08 MED ORDER — HEPARIN SOD (PORK) LOCK FLUSH 100 UNIT/ML IV SOLN
INTRAVENOUS | Status: AC
  Filled 2017-04-08: qty 5

## 2017-04-08 MED ORDER — SODIUM CHLORIDE 0.9 % IV SOLN
Freq: Once | INTRAVENOUS | Status: AC
Start: 2017-04-08 — End: 2017-04-08
  Administered 2017-04-08: 14:00:00 via INTRAVENOUS
  Filled 2017-04-08: qty 1000

## 2017-04-08 MED ORDER — HEPARIN SOD (PORK) LOCK FLUSH 100 UNIT/ML IV SOLN
500.0000 [IU] | Freq: Once | INTRAVENOUS | Status: AC
  Administered 2017-04-08: 500 [IU] via INTRAVENOUS

## 2017-04-09 ENCOUNTER — Other Ambulatory Visit: Payer: Self-pay

## 2017-04-09 ENCOUNTER — Other Ambulatory Visit: Payer: Self-pay | Admitting: Student

## 2017-04-09 ENCOUNTER — Encounter: Payer: Self-pay | Admitting: Radiation Oncology

## 2017-04-09 ENCOUNTER — Ambulatory Visit
Admission: RE | Admit: 2017-04-09 | Discharge: 2017-04-09 | Disposition: A | Discharge status: Home / Self Care | Payer: Medicare Other | Source: Ambulatory Visit | Attending: Oncology | Admitting: Oncology

## 2017-04-09 ENCOUNTER — Ambulatory Visit
Admission: RE | Admit: 2017-04-09 | Discharge: 2017-04-09 | Disposition: A | Discharge status: Home / Self Care | Payer: Medicare Other | Source: Ambulatory Visit | Attending: Radiation Oncology | Admitting: Radiation Oncology

## 2017-04-09 ENCOUNTER — Telehealth: Payer: Self-pay | Admitting: Gastroenterology

## 2017-04-09 VITALS — BP 141/69 | HR 72 | Temp 99.1°F | Wt 232.1 lb

## 2017-04-09 DIAGNOSIS — G4730 Sleep apnea, unspecified: Secondary | ICD-10-CM | POA: Insufficient documentation

## 2017-04-09 DIAGNOSIS — R0602 Shortness of breath: Secondary | ICD-10-CM | POA: Insufficient documentation

## 2017-04-09 DIAGNOSIS — K769 Liver disease, unspecified: Secondary | ICD-10-CM

## 2017-04-09 DIAGNOSIS — Z8052 Family history of malignant neoplasm of bladder: Secondary | ICD-10-CM | POA: Insufficient documentation

## 2017-04-09 DIAGNOSIS — C787 Secondary malignant neoplasm of liver and intrahepatic bile duct: Secondary | ICD-10-CM | POA: Diagnosis not present

## 2017-04-09 DIAGNOSIS — R634 Abnormal weight loss: Secondary | ICD-10-CM | POA: Insufficient documentation

## 2017-04-09 DIAGNOSIS — C155 Malignant neoplasm of lower third of esophagus: Secondary | ICD-10-CM

## 2017-04-09 DIAGNOSIS — J449 Chronic obstructive pulmonary disease, unspecified: Secondary | ICD-10-CM | POA: Diagnosis not present

## 2017-04-09 DIAGNOSIS — Z510 Encounter for antineoplastic radiation therapy: Secondary | ICD-10-CM | POA: Diagnosis not present

## 2017-04-09 DIAGNOSIS — Z79899 Other long term (current) drug therapy: Secondary | ICD-10-CM | POA: Insufficient documentation

## 2017-04-09 DIAGNOSIS — I10 Essential (primary) hypertension: Secondary | ICD-10-CM | POA: Diagnosis not present

## 2017-04-09 DIAGNOSIS — Z87442 Personal history of urinary calculi: Secondary | ICD-10-CM | POA: Insufficient documentation

## 2017-04-09 DIAGNOSIS — N3281 Overactive bladder: Secondary | ICD-10-CM | POA: Insufficient documentation

## 2017-04-09 DIAGNOSIS — J45909 Unspecified asthma, uncomplicated: Secondary | ICD-10-CM | POA: Diagnosis not present

## 2017-04-09 DIAGNOSIS — Z85820 Personal history of malignant melanoma of skin: Secondary | ICD-10-CM | POA: Insufficient documentation

## 2017-04-09 DIAGNOSIS — Z803 Family history of malignant neoplasm of breast: Secondary | ICD-10-CM | POA: Insufficient documentation

## 2017-04-09 DIAGNOSIS — R011 Cardiac murmur, unspecified: Secondary | ICD-10-CM | POA: Insufficient documentation

## 2017-04-09 DIAGNOSIS — R490 Dysphonia: Secondary | ICD-10-CM | POA: Insufficient documentation

## 2017-04-09 DIAGNOSIS — Z87891 Personal history of nicotine dependence: Secondary | ICD-10-CM | POA: Diagnosis not present

## 2017-04-09 DIAGNOSIS — G4733 Obstructive sleep apnea (adult) (pediatric): Secondary | ICD-10-CM | POA: Diagnosis not present

## 2017-04-09 DIAGNOSIS — R200 Anesthesia of skin: Secondary | ICD-10-CM | POA: Diagnosis not present

## 2017-04-09 DIAGNOSIS — Z96659 Presence of unspecified artificial knee joint: Secondary | ICD-10-CM | POA: Insufficient documentation

## 2017-04-09 DIAGNOSIS — Z806 Family history of leukemia: Secondary | ICD-10-CM | POA: Insufficient documentation

## 2017-04-09 DIAGNOSIS — Z9049 Acquired absence of other specified parts of digestive tract: Secondary | ICD-10-CM | POA: Diagnosis not present

## 2017-04-09 DIAGNOSIS — Z800 Family history of malignant neoplasm of digestive organs: Secondary | ICD-10-CM | POA: Insufficient documentation

## 2017-04-09 DIAGNOSIS — M129 Arthropathy, unspecified: Secondary | ICD-10-CM | POA: Diagnosis not present

## 2017-04-09 DIAGNOSIS — Z801 Family history of malignant neoplasm of trachea, bronchus and lung: Secondary | ICD-10-CM | POA: Insufficient documentation

## 2017-04-09 DIAGNOSIS — Z8042 Family history of malignant neoplasm of prostate: Secondary | ICD-10-CM | POA: Insufficient documentation

## 2017-04-09 HISTORY — DX: Dyspnea, unspecified: R06.00

## 2017-04-09 HISTORY — DX: Cardiac murmur, unspecified: R01.1

## 2017-04-09 HISTORY — DX: Personal history of urinary calculi: Z87.442

## 2017-04-09 LAB — CBC
HEMATOCRIT: 31.8 % — AB (ref 35.0–47.0)
HEMOGLOBIN: 10.3 g/dL — AB (ref 12.0–16.0)
MCH: 30.1 pg (ref 26.0–34.0)
MCHC: 32.5 g/dL (ref 32.0–36.0)
MCV: 92.8 fL (ref 80.0–100.0)
Platelets: 223 10*3/uL (ref 150–440)
RBC: 3.43 MIL/uL — ABNORMAL LOW (ref 3.80–5.20)
RDW: 14.2 % (ref 11.5–14.5)
WBC: 6.7 10*3/uL (ref 3.6–11.0)

## 2017-04-09 LAB — PROTIME-INR
INR: 0.97
Prothrombin Time: 12.8 seconds (ref 11.4–15.2)

## 2017-04-09 LAB — APTT: aPTT: 29 seconds (ref 24–36)

## 2017-04-09 MED ORDER — MIDAZOLAM HCL 5 MG/5ML IJ SOLN
INTRAMUSCULAR | Status: AC | PRN
  Administered 2017-04-09: 1 mg via INTRAVENOUS

## 2017-04-09 MED ORDER — FENTANYL CITRATE (PF) 100 MCG/2ML IJ SOLN
INTRAMUSCULAR | Status: AC | PRN
  Administered 2017-04-09: 50 ug via INTRAVENOUS

## 2017-04-09 MED ORDER — FENTANYL CITRATE (PF) 100 MCG/2ML IJ SOLN
INTRAMUSCULAR | Status: AC
  Filled 2017-04-09: qty 4

## 2017-04-09 MED ORDER — MIDAZOLAM HCL 5 MG/5ML IJ SOLN
INTRAMUSCULAR | Status: AC
  Filled 2017-04-09: qty 5

## 2017-04-09 MED ORDER — SODIUM CHLORIDE 0.9 % IV SOLN
INTRAVENOUS | Status: DC
  Administered 2017-04-09: 11:00:00 via INTRAVENOUS

## 2017-04-09 NOTE — H&P (Signed)
Chief Complaint: Patient was seen in consultation today for liver biopsy at the request of Yu,Zhou  Referring Physician(s): Yu,Zhou  Patient Status: [facility] - Out-pt  History of Present Illness: [name] is a 82 y.o. 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 EGD with biopsy of a partially obstructing distal esophageal mass on 03/21/17 revealed adenocarcinoma.  PET scan on 11/16 demonstrates multiple hypermetabolic liver lesions consistent with metastatic disease. She complains of some mild right sided abdominal pain.  Past Medical History:  Diagnosis Date  . Arthritis   . Asthma   . COPD (chronic obstructive pulmonary disease) ([place])   . Dysphonia   . Dyspnea   . Heart murmur   . History of kidney stones   . Hypertension   . Melanoma ([place])   . OAB (overactive bladder)   . OSA on CPAP   . 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 Pupil asymmetry    R pupil defect  . Skin cancer   . Sleep apnea   . Spinal cord abscess     Past Surgical History:  Procedure Laterality Date  . ABDOMINAL HYSTERECTOMY    . ANTERIOR CERVICAL DECOMP/DISCECTOMY FUSION N/A 07/16/2013   Procedure: ANTERIOR CERVICAL DECOMPRESSION/DISCECTOMY FUSION mutlipleLEVELS C4-7;  Surgeon: Erline Levine, MD;  Location: [place] [facility];  Service: Neurosurgery;  Laterality: N/A;  . APPENDECTOMY    . carpel tunn Bilateral   . CATARACT EXTRACTION    . CATARACT EXTRACTION, BILATERAL    . CHOLECYSTECTOMY    . dental implant    . ESOPHAGOGASTRODUODENOSCOPY Left 03/21/2017   Procedure: ESOPHAGOGASTRODUODENOSCOPY (EGD);  Surgeon: Virgel Manifold, MD;  Location[facility] END[facility];  Service: Endoscopy;  Laterality: Left;  Marland Kitchen GASTRIC BYPASS    . HIATAL HERNIA REPAIR    . MELANOMA EXCISION    . PORTA CATH INSERTION N/A 04/07/2017   Procedure: PORTA CATH INSERTION;  Surgeon: Algernon Huxley,  MD;  Location: [place] [facility] CV LAB;  Service: Cardiovascular;  Laterality: N/A;  . POSTERIOR CERVICAL FUSION/FORAMINOTOMY N/A 07/28/2013   Procedure: Cervical four-seven  posterior cervical fusion;  Surgeon: Erline Levine, MD;  Location: [place] [facility];  Service: Neurosurgery;  Laterality: N/A;  . TONSILLECTOMY    . TOTAL KNEE ARTHROPLASTY      Allergies: Cefuroxime; Codeine; Doxycycline; Gabapentin; Iohexol; Other; Oxycodone; Quinolones; and Synvisc [hylan g-f 20]  Medications: Prior to Admission medications   Medication Sig Start Date End Date Taking? Authorizing Provider  acetaminophen (TYLENOL) 325 MG tablet Take 650 mg by mouth every 6 (six) hours as needed for moderate pain or headache.    Yes [provider]  calcium carbonate (TUMS EX) 750 MG chewable tablet Chew 1 tablet by mouth daily.   Yes [provider]  Cholecalciferol (VITAMIN D) 2000 UNITS CAPS Take 2,000 Units by mouth daily.   Yes [provider]  cyanocobalamin 500 MCG tablet Take 500 mcg every other day by mouth.   Yes [provider]  fluticasone (FLONASE) 50 MCG/ACT nasal spray Place 1-2 sprays into both nostrils daily.    Yes [provider]  hydrocortisone cream 1 % Apply 1 application topically daily as needed for itching.   Yes [provider]  loratadine (CLARITIN) 10 MG tablet Take 10 mg by mouth daily.   Yes [provider]  LYRICA 150 MG capsule TAKE 1 CAPSULE  BY MOUTH TWO TIMES DAILY 01/06/17  Yes Tonia Ghent, MD  metoprolol succinate (TOPROL-XL) 50 MG 24 hr tablet TAKE 1 TABLET BY MOUTH DAILY 01/14/17  Yes Tonia Ghent, MD  oxybutynin (DITROPAN) 5 MG tablet TAKE 1/2 TABLET BY MOUTH IN THE MORNING AND 1 TABLET AT BEDTIME 03/27/17  Yes Tonia Ghent, MD  Polyethyl Glycol-Propyl Glycol (SYSTANE OP) Apply 1 drop to eye daily as needed (dry eyes).   Yes [provider]  senna-docusate (SENOKOT-S) 8.6-50 MG tablet Take 1 tablet at bedtime  as needed by mouth for mild constipation. 03/22/17  Yes Gouru, Illene Silver, MD  traMADol (ULTRAM) 50 MG tablet Take 50 mg by mouth 3 (three) times daily as needed for moderate pain.    Yes [provider]  traZODone (DESYREL) 50 MG tablet Take 25 mg by mouth at bedtime.  06/30/13  Yes [provider]  ferrous sulfate 325 (65 FE) MG EC tablet Take 1 tablet (325 mg total) 2 (two) times daily by mouth. 03/22/17 03/22/18  Nicholes Mango, MD     Family History  Problem Relation Age of Onset  . Breast cancer Mother 15  . Arthritis-Osteo Mother   . Breast cancer Sister 59  . Bladder Cancer Sister   . Bladder Cancer Father   . Tongue cancer Father   . Congenital heart disease Father   . Prostate cancer Brother   . Uterine cancer Maternal Aunt   . Lung cancer Paternal Uncle   . Leukemia Maternal Grandmother   . Liver cancer Paternal Grandmother   . Colon cancer Neg Hx     Social History   Socioeconomic History  . Marital status: Widowed    Spouse name: None  . Number of children: 2  . Years of education: None  . Highest education level: Master's degree (e.g., MA, MS, MEng, MEd, MSW, MBA)  Social Needs  . Financial resource strain: Not hard at all  . Food insecurity - worry: Never true  . Food insecurity - inability: Never true  . Transportation needs - medical: No  . Transportation needs - non-medical: No  Occupational History  . None  Tobacco Use  . Smoking status: Former Smoker    Packs/day: 1.00    Years: 20.00    Pack years: 20.00    Types: Cigarettes    Last attempt to quit: 1977    Years since quitting: 41.9  . Smokeless tobacco: Never Used  Substance and Sexual Activity  . Alcohol use: No  . Drug use: No  . Sexual activity: No  Other Topics Concern  . None  Social History Narrative   Marital Status- Widowed    Lives by herself    Employement- Retired Pharmacist, hospital   Exercise hx- Does PT     ECOG Status: 1 - Symptomatic but completely ambulatory  Review  of Systems: A 12 point ROS discussed and pertinent positives are indicated in the HPI above.  All other systems are negative.  Review of Systems  Constitutional: Negative.   HENT: Positive for trouble swallowing.   Respiratory: Negative.   Cardiovascular: Negative.   Gastrointestinal: Positive for abdominal pain.  Genitourinary: Negative.   Musculoskeletal: Negative.   Neurological: Negative.     Vital Signs: BP 126/62   Pulse 60   Temp 98.4 F (36.9 C) (Oral)   Resp (!) 21   SpO2 95%   Physical Exam  Constitutional: She is oriented to person, place, and time. No distress.  HENT:  Head:  Normocephalic and atraumatic.  Neck: Neck supple. No JVD present.  Cardiovascular: Normal rate. Exam reveals no gallop and no friction rub.  Murmur heard. III/VI SEM  Pulmonary/Chest: Effort normal and breath sounds normal. No stridor. No respiratory distress. She has no wheezes. She has no rales.  Abdominal: Soft. Bowel sounds are normal. She exhibits no distension and no mass. There is no tenderness. There is no rebound and no guarding.  Musculoskeletal: She exhibits no edema.  Lymphadenopathy:    She has no cervical adenopathy.  Neurological: She is alert and oriented to person, place, and time.  Skin: Skin is warm and dry. She is not diaphoretic.  Vitals reviewed.   Imaging: Ct Abdomen Pelvis Wo Contrast  Result Date: 03/20/2017 CLINICAL DATA:  foreign body sensation EXAM: CT ABDOMEN AND PELVIS WITHOUT CONTRAST TECHNIQUE: Multidetector CT imaging of the abdomen and pelvis was performed following the standard protocol without IV contrast. COMPARISON:  06/29/2015 FINDINGS: Lower chest: Lung bases demonstrate no acute consolidation or pleural effusion. Mild cardiomegaly. Mitral calcifications. Hepatobiliary: Interim development of multiple hypodense liver masses. Question slight nodularity of the liver contour. Surgical absence of the gallbladder. No biliary dilatation. Pancreas:  Unremarkable. No pancreatic ductal dilatation or surrounding inflammatory changes. Spleen: Normal in size without focal abnormality. Adrenals/Urinary Tract: Adrenal glands are within normal limits. Staghorn calculus in the lower pole of the left kidney measuring up to 17 mm. No hydronephrosis. Bladder normal. Stomach/Bowel: Status post gastric bypass. Distended gastric pouch filled with fluid and food material which extends into the jejunal limb. No dilated small bowel distal to this is suggest obstruction. No colon wall thickening. Vascular/Lymphatic: Aortic atherosclerosis. Enlarged gastrohepatic lymph node measuring 14 mm. Additional prominent porta hepatis lymph nodes. Reproductive: Status post hysterectomy. No adnexal masses. Other: Negative for free air or free fluid. Musculoskeletal: Degenerative changes of the spine. Grade 1 anterolisthesis of L4 on L5. No acute or suspicious bone lesion is seen. Lucent lesion in the left femoral neck is unchanged. IMPRESSION: 1. Status post gastric bypass with distension of the gastric pouch by fluid and food bolus. There is some extension of the food bolus into the jejunal limb. No dilated small bowel distal to this is suggest obstruction. 2. Interim development of multiple hypo dense liver masses, concerning for metastatic disease or liver primary. Follow-up contrast-enhanced CT and or MRI is recommended. 3. Enlarged gastrohepatic and porta hepatis nodes are concerning for metastatic disease. 4. Staghorn calculus on the left.  No hydronephrosis. Electronically Signed   By: Donavan Foil M.D.   On: 03/20/2017 03:59   Dg Neck Soft Tissue  Result Date: 03/20/2017 CLINICAL DATA:  Feels like food stuck in the throat. Shortness of breath. EXAM: NECK SOFT TISSUES - 1+ VIEW COMPARISON:  Cervical spine 09/24/2013 FINDINGS: There is no evidence of retropharyngeal soft tissue swelling or epiglottic enlargement. The cervical airway is unremarkable and no radio-opaque foreign body  identified. Postoperative changes with anterior and posterior fixation of the cervical spine. IMPRESSION: No radiopaque soft tissue foreign bodies are identified. Postoperative changes in the cervical spine. Electronically Signed   By: Lucienne Capers M.D.   On: 03/20/2017 02:34   Dg Chest 2 View  Result Date: 03/20/2017 CLINICAL DATA:  Feels like food stuck in the throat. Shortness of breath. EXAM: CHEST  2 VIEW COMPARISON:  07/10/2015 FINDINGS: Mild cardiac enlargement. No pulmonary vascular congestion or edema. Peribronchial thickening with central interstitial changes suggesting chronic bronchitis. Linear scarring in the left mid lung. No focal consolidation. No blunting  of costophrenic angles. No pneumothorax. Mediastinal contours appear intact. Postoperative changes in the cervical spine. Degenerative changes in the thoracic spine. No apparent esophageal dilatation. IMPRESSION: Cardiac enlargement. Chronic bronchitic changes in the lungs. No evidence of active pulmonary disease. Electronically Signed   By: Lucienne Capers M.D.   On: 03/20/2017 02:35   Nm Pulmonary Perf And Vent  Result Date: 03/20/2017 CLINICAL DATA:  Shortness of Breath EXAM: NUCLEAR MEDICINE VENTILATION - PERFUSION LUNG SCAN VIEWS: Anterior, posterior, left lateral, right lateral, RAO, LAO, RPO, LPO -ventilation and perfusion RADIOPHARMACEUTICALS:  32.8 mCi Technetium-16m DTPA aerosol inhalation and 4.23 mCi Technetium-32m MAA IV COMPARISON:  Chest radiograph March 20 2017 FINDINGS: Ventilation: Radiotracer uptake on the ventilation study is homogeneous and symmetric. There are no appreciable ventilation defects. Perfusion: Radiotracer uptake on the perfusion study is homogeneous and symmetric. There are no appreciable perfusion defects. IMPRESSION: No appreciable ventilation or perfusion defects. This study constitutes a very low probability of pulmonary embolus. Electronically Signed   By: Lowella Grip III M.D.   On:  03/20/2017 14:55   Nm Pet Image Initial (pi) Skull Base To Thigh  Result Date: 03/28/2017 CLINICAL DATA:  Initial Treatment strategy for esophageal cancer. EXAM: NUCLEAR MEDICINE PET SKULL BASE TO THIGH TECHNIQUE: 12.2 mCi F-18 FDG was injected intravenously. Full-ring PET imaging was performed from the skull base to thigh after the radiotracer. CT data was obtained and used for attenuation correction and anatomic localization. FASTING BLOOD GLUCOSE:  Value: 101 mg/dl COMPARISON:  CT abdomen 03/20/2017 FINDINGS: NECK No hypermetabolic lymph nodes in the neck. CHEST Distal esophageal and gastric cardia mass, maximum SUV 12.7, with associated wall thickening. No other abnormal hypermetabolic activity in the chest. Mosaic attenuation in the lungs potentially from extrinsic allergic alveolitis or air trapping. Mild cardiomegaly. Coronary, aortic arch, and branch vessel atherosclerotic vascular disease. Small paratracheal lymph nodes are not hypermetabolic or pathologically enlarged by size criteria. ABDOMEN/PELVIS Hypermetabolic gastroesophageal mass as noted above. Hypermetabolic right gastric and gastrohepatic ligament adenopathy, with a dominant right gastric lymph node measuring 1.4 cm in short axis on image 137/3 with maximum SUV 14.4. There scattered metastatic lesions throughout the liver potentially sparing segment 2 and the caudate lobe but otherwise discretely identified in all segments. A dominant lesion in the right hepatic lobe has a maximum SUV of 14.0 and with abnormal accentuated metabolic activity measuring 6.0 by 3.8 cm. Aortoiliac atherosclerotic vascular disease. Postoperative findings in the stomach. Staghorn calculus in the left kidney lower pole measuring 1.7 cm in anterior-posterior axis. SKELETON No focal hypermetabolic activity to suggest skeletal metastasis. Lower lumbar degenerative facet arthropathy. Thoracolumbar spondylosis. Degenerative left sternoclavicular arthropathy. Lower  cervical fusion. Pelvic floor laxity. IMPRESSION: 1. Hypermetabolic mass of the gastroesophageal junction with adjacent right gastric hypermetabolic adenopathy and numerous hypermetabolic hepatic masses involving almost all segments of the liver. 2. Other imaging findings of potential clinical significance: Mosaic attenuation in the lungs, query air trapping or extrinsic allergic alveolitis. Mild cardiomegaly. Aortic Atherosclerosis (ICD10-I70.0). Coronary atherosclerosis. Postoperative findings in the stomach. Left kidney lower pole staghorn calculus. Pelvic floor laxity. Electronically Signed   By: Van Clines M.D.   On: 03/28/2017 15:39    Labs:  CBC: Recent Labs    03/21/17 0612 03/22/17 0504 03/27/17 1232 04/09/17 1015  WBC 7.7 6.7 7.8 6.7  HGB 8.1* 8.6* 9.0 Repeated and verified X2.* 10.3*  HCT 24.8* 26.1* 27.6* 31.8*  PLT 163 159 259.0 223    COAGS: Recent Labs    03/21/17 1200 04/09/17 1015  INR 0.98 0.97  APTT  --  29    BMP: Recent Labs    03/20/17 0116 03/21/17 0612 03/22/17 0504 03/27/17 1232  NA 142 144 141 141  K 4.0 3.6 4.1 4.2  CL 103 112* 107 104  CO2 28 26 27 30   GLUCOSE 169* 88 108* 100*  BUN 36* 24* 17 11  CALCIUM 8.8* 7.5* 8.3* 9.2  CREATININE 0.83 0.55 0.63 0.69  GFRNONAA >60 >60 >60  --   GFRAA >60 >60 >60  --     LIVER FUNCTION TESTS: Recent Labs    12/17/16 1637 03/20/17 0116  BILITOT 0.3 0.5  AST 15 21  ALT 9 13*  ALKPHOS 60 66  PROT 7.0 7.0  ALBUMIN 3.9 3.6    Assessment and Plan:  For US guided biopsy of liver lesion today to confirm metastatic esophageal adenocarcinoma.  Risks and benefits discussed with the patient including, but not limited to bleeding, infection, damage to adjacent structures or low yield requiring additional tests. All of the patient's questions were answered, patient is agreeable to proceed. Consent signed and in chart.  Thank you for this interesting consult.  I greatly enjoyed meeting Joselyn T  Belding and look forward to participating in their care.  A copy of this report was sent to the requesting provider on this date.  Electronically Signed: Azzie Roup, MD 04/09/2017, 11:00 AM     I spent a total of 30 Minutes in face to face in clinical consultation, greater than 50% of which was counseling/coordinating care for liver biopsy.

## 2017-04-09 NOTE — Procedures (Signed)
Interventional Radiology Procedure Note  Procedure: US guided core biopsy of liver lesion  Complications: None  Estimated Blood Loss: < 10 mL  Findings: Lesion in anterior subcapsular liver in lower right lobe targeted.  18 G core samples x 3 via 17 G needle.  Gelfoam slurry injected through outer needle on completion.  Venetia Night. Kathlene Cote, M.D Pager:  [phone]

## 2017-04-09 NOTE — Discharge Instructions (Signed)
Moderate Conscious Sedation, Adult, Care After °These instructions provide you with information about caring for yourself after your procedure. Your health care provider may also give you more specific instructions. Your treatment has been planned according to current medical practices, but problems sometimes occur. Call your health care provider if you have any problems or questions after your procedure. °What can I expect after the procedure? °After your procedure, it is common: °· To feel sleepy for several hours. °· To feel clumsy and have poor balance for several hours. °· To have poor judgment for several hours. °· To vomit if you eat too soon. ° °Follow these instructions at home: °For at least 24 hours after the procedure: ° °· Do not: °? Participate in activities where you could fall or become injured. °? Drive. °? Use heavy machinery. °? Drink alcohol. °? Take sleeping pills or medicines that cause drowsiness. °? Make important decisions or sign legal documents. °? Take care of children on your own. °· Rest. °Eating and drinking °· Follow the diet recommended by your health care provider. °· If you vomit: °? Drink water, juice, or soup when you can drink without vomiting. °? Make sure you have little or no nausea before eating solid foods. °General instructions °· Have a responsible adult stay with you until you are awake and alert. °· Take over-the-counter and prescription medicines only as told by your health care provider. °· If you smoke, do not smoke without supervision. °· Keep all follow-up visits as told by your health care provider. This is important. °Contact a health care provider if: °· You keep feeling nauseous or you keep vomiting. °· You feel light-headed. °· You develop a rash. °· You have a fever. °Get help right away if: °· You have trouble breathing. °This information is not intended to replace advice given to you by your health care provider. Make sure you discuss any questions you have  with your health care provider. °Document Released: 02/17/2013 Document Revised: 10/02/2015 Document Reviewed: 08/19/2015 °Elsevier Interactive Patient Education © 2018 [employer] ° ° °Liver Biopsy, Care After °These instructions give you information on caring for yourself after your procedure. Your doctor may also give you more specific instructions. Call your doctor if you have any problems or questions after your procedure. °Follow these instructions at home: °· Rest at home for 1-2 days or as told by your doctor. °· Have someone stay with you for at least 24 hours. °· Do not do these things in the first 24 hours: °? Drive. °? Use machinery. °? Take care of other people. °? Sign legal documents. °? Take a bath or shower. °· There are many different ways to close and cover a cut (incision). For example, a cut can be closed with stitches, skin glue, or adhesive strips. Follow your doctor's instructions on: °? Taking care of your cut. °? Changing and removing your bandage (dressing). °? Removing whatever was used to close your cut. °· Do not drink alcohol in the first week. °· Do not lift more than 5 pounds or play contact sports for the first 2 weeks. °· Take medicines only as told by your doctor. For 1 week, do not take medicine that has aspirin in it or medicines like ibuprofen. °· Get your test results. °Contact a doctor if: °· A cut bleeds and leaves more than just a small spot of blood. °· A cut is red, puffs up (swells), or hurts more than before. °· Fluid or something else   comes from a cut. °· A cut smells bad. °· You have a fever or chills. °Get help right away if: °· You have swelling, bloating, or pain in your belly (abdomen). °· You get dizzy or faint. °· You have a rash. °· You feel sick to your stomach (nauseous) or throw up (vomit). °· You have trouble breathing, feel short of breath, or feel faint. °· Your chest hurts. °· You have problems talking or seeing. °· You have trouble balancing or moving  your arms or legs. °This information is not intended to replace advice given to you by your health care provider. Make sure you discuss any questions you have with your health care provider. °Document Released: 02/06/2008 Document Revised: 10/05/2015 Document Reviewed: 06/25/2013 °Elsevier Interactive Patient Education © 2018 [employer] Inc[employer] ° ° °

## 2017-04-09 NOTE — Patient Instructions (Signed)

## 2017-04-09 NOTE — Telephone Encounter (Signed)
Left voice message for patient to call and schedule a 4 week follow up with Dr. Bonna Gains

## 2017-04-09 NOTE — Addendum Note (Signed)
Encounter addended by: Noreene Filbert, MD on: 04/09/2017 3:09 PM  Actions taken: LOS modified, Follow-up modified

## 2017-04-09 NOTE — Consult Note (Signed)
NEW PATIENT EVALUATION  Name: [name]  MRN: [id number]  Date:   04/09/2017     DOB: 1935/05/06   This 82 y.o. female patient presents to the clinic for initial evaluation of palliative treatment distal esophageal adenocarcinoma with liver metastasis.  REFERRING PHYSICIAN: Tonia Ghent, MD  CHIEF COMPLAINT:  Chief Complaint  Patient presents with  . Esophageal Cancer    Initial Evaluation    DIAGNOSIS: The encounter diagnosis was Malignant neoplasm of lower third of esophagus ([place]).   PREVIOUS INVESTIGATIONS:  PET CT scan reviewed Pathology reports reviewed Clinical notes reviewed  HPI: Patient is a an 82 year old female who noticed increasing and dysphasia with solid food getting stuck in her esophagus over the past several months. She's also had a 20 pound weight loss over the past year. She was seen in the emergency room for shortness of breath was found on CT scan to have multiple masses in the liver of unknown etiology. She had underwent upper endoscopy showing a partially obstructing esophageal mass at the distal part of the esophagus with biopsy positive for adenocarcinoma. She's had a needle biopsy of her liver today. PET CT scan demonstrates marked hypermetabolic activity in the liver as well as a distal esophagus consistent with stage IV esophageal cancer with liver metastasis. She is seen today for consideration of palliative radiation therapy. She has been scheduled for her first cycle of FOLFOX chemotherapy next week. She has had a Port-A-Cath placed. She is seen today and doing well. She is chewing her food well mostly a soft diet.  PLANNED TREATMENT REGIMEN: Palliative radiation therapy  PAST MEDICAL HISTORY:  has a past medical history of Arthritis, Asthma, COPD (chronic obstructive pulmonary disease) ([place]), Dysphonia, Dyspnea, Heart murmur, History of kidney stones, Hypertension, Melanoma ([place]), OAB (overactive bladder), OSA on CPAP, Paresthesia, Pupil  asymmetry, Skin cancer, Sleep apnea, and Spinal cord abscess.    PAST SURGICAL HISTORY:  Past Surgical History:  Procedure Laterality Date  . ABDOMINAL HYSTERECTOMY    . ANTERIOR CERVICAL DECOMP/DISCECTOMY FUSION N/A 07/16/2013   Procedure: ANTERIOR CERVICAL DECOMPRESSION/DISCECTOMY FUSION mutlipleLEVELS C4-7;  Surgeon: Erline Levine, MD;  Location: [facility];  Service: Neurosurgery;  Laterality: N/A;  . APPENDECTOMY    . carpel tunn Bilateral   . CATARACT EXTRACTION    . CATARACT EXTRACTION, BILATERAL    . CHOLECYSTECTOMY    . dental implant    . ESOPHAGOGASTRODUODENOSCOPY Left 03/21/2017   Procedure: ESOPHAGOGASTRODUODENOSCOPY (EGD);  Surgeon: Virgel Manifold, MD;  Location: [facility] END[facility]COPY;  Service: Endoscopy;  Laterality: Left;  Marland Kitchen GASTRIC BYPASS    . HIATAL HERNIA REPAIR    . MELANOMA EXCISION    . PORTA CATH INSERTION N/A 04/07/2017   Procedure: PORTA CATH INSERTION;  Surgeon: Algernon Huxley, MD;  Location: [facility] CV LAB;  Service: Cardiovascular;  Laterality: N/A;  . POSTERIOR CERVICAL FUSION/FORAMINOTOMY N/A 07/28/2013   Procedure: Cervical four-seven  posterior cervical fusion;  Surgeon: Erline Levine, MD;  Location: [facility];  Service: Neurosurgery;  Laterality: N/A;  . TONSILLECTOMY    . TOTAL KNEE ARTHROPLASTY      FAMILY HISTORY: family history includes Arthritis-Osteo in her mother; Bladder Cancer in her father and sister; Breast cancer (age of onset: 62) in her mother; Breast cancer (age of onset: 37) in her sister; Congenital heart disease in her father; Leukemia in her maternal grandmother; Liver cancer in her paternal grandmother; Lung cancer in her paternal uncle; Prostate cancer in her brother; Tongue cancer in  her father; Uterine cancer in her maternal aunt.  SOCIAL HISTORY:  reports that she quit smoking about 41 years ago. Her smoking use included cigarettes. She has a 20.00 pack-year smoking history. she has never used smokeless tobacco. She reports that  she does not drink alcohol or use drugs.  ALLERGIES: Cefuroxime; Codeine; Doxycycline; Gabapentin; Iohexol; Other; Oxycodone; Quinolones; and Synvisc [hylan g-f 20]  MEDICATIONS:  Current Outpatient Medications  Medication Sig Dispense Refill  . acetaminophen (TYLENOL) 325 MG tablet Take 650 mg by mouth every 6 (six) hours as needed for moderate pain or headache.     . calcium carbonate (TUMS EX) 750 MG chewable tablet Chew 1 tablet by mouth daily.    . Cholecalciferol (VITAMIN D) 2000 UNITS CAPS Take 2,000 Units by mouth daily.    . cyanocobalamin 500 MCG tablet Take 500 mcg every other day by mouth.    . ferrous sulfate 325 (65 FE) MG EC tablet Take 1 tablet (325 mg total) 2 (two) times daily by mouth. 60 tablet 3  . fluticasone (FLONASE) 50 MCG/ACT nasal spray Place 1-2 sprays into both nostrils daily.     . hydrocortisone cream 1 % Apply 1 application topically daily as needed for itching.    . loratadine (CLARITIN) 10 MG tablet Take 10 mg by mouth daily.    Marland Kitchen LYRICA 150 MG capsule TAKE 1 CAPSULE BY MOUTH TWO TIMES DAILY 180 capsule 1  . metoprolol succinate (TOPROL-XL) 50 MG 24 hr tablet TAKE 1 TABLET BY MOUTH DAILY 90 tablet 3  . oxybutynin (DITROPAN) 5 MG tablet TAKE 1/2 TABLET BY MOUTH IN THE MORNING AND 1 TABLET AT BEDTIME    . Polyethyl Glycol-Propyl Glycol (SYSTANE OP) Apply 1 drop to eye daily as needed (dry eyes).    Marland Kitchen senna-docusate (SENOKOT-S) 8.6-50 MG tablet Take 1 tablet at bedtime as needed by mouth for mild constipation.    . traMADol (ULTRAM) 50 MG tablet Take 50 mg by mouth 3 (three) times daily as needed for moderate pain.     . traZODone (DESYREL) 50 MG tablet Take 25 mg by mouth at bedtime.      No current facility-administered medications for this encounter.    Facility-Administered Medications Ordered in Other Encounters  Medication Dose Route Frequency Provider Last Rate Last Dose  . 0.9 %  sodium chloride infusion   Intravenous Continuous Ardis Rowan,  PA-C   Stopped at 04/09/17 1330  . fentaNYL (SUBLIMAZE) 100 MCG/2ML injection           . midazolam (VERSED) 5 MG/5ML injection             ECOG PERFORMANCE STATUS:  1 - Symptomatic but completely ambulatory  REVIEW OF SYSTEMS: Except for the swallowing difficulties weight loss Patient denies any weight loss, fatigue, weakness, fever, chills or night sweats. Patient denies any loss of vision, blurred vision. Patient denies any ringing  of the ears or hearing loss. No irregular heartbeat. Patient denies heart murmur or history of fainting. Patient denies any chest pain or pain radiating to her upper extremities. Patient denies any shortness of breath, difficulty breathing at night, cough or hemoptysis. Patient denies any swelling in the lower legs. Patient denies any nausea vomiting, vomiting of blood, or coffee ground material in the vomitus. Patient denies any stomach pain. Patient states has had normal bowel movements no significant constipation or diarrhea. Patient denies any dysuria, hematuria or significant nocturia. Patient denies any problems walking, swelling in the joints or loss  of balance. Patient denies any skin changes, loss of hair or loss of weight. Patient denies any excessive worrying or anxiety or significant depression. Patient denies any problems with insomnia. Patient denies excessive thirst, polyuria, polydipsia. Patient denies any swollen glands, patient denies easy bruising or easy bleeding. Patient denies any recent infections, allergies or URI. Patient "s visual fields have not changed significantly in recent time.    PHYSICAL EXAM: BP (!) 141/69   Pulse 72   Temp 99.1 F (37.3 C)   Wt 232 lb 2.3 oz (105.3 kg)   BMI 42.12 kg/m  She does have a recently placed Port-A-Cath in the right anterior chest wall Well-developed well-nourished patient in NAD. HEENT reveals PERLA, EOMI, discs not visualized.  Oral cavity is clear. No oral mucosal lesions are identified. Neck is clear  without evidence of cervical or supraclavicular adenopathy. Lungs are clear to A&P. Cardiac examination is essentially unremarkable with regular rate and rhythm without murmur rub or thrill. Abdomen is benign with no organomegaly or masses noted. Motor sensory and DTR levels are equal and symmetric in the upper and lower extremities. Cranial nerves II through XII are grossly intact. Proprioception is intact. No peripheral adenopathy or edema is identified. No motor or sensory levels are noted. Crude visual fields are within normal range.  LABORATORY DATA: Pathology reports reviewed    RADIOLOGY RESULTS: PET CT scan reviewed   IMPRESSION: Stage IV distal esophageal adenocarcinoma in 82 year old female with positive liver metastasis  PLAN: We will wait her pathology results of her needle biopsy of her liver although it most definitely is stage IV esophageal cancer with liver metastasis. I would offer palliative course of radiation therapy 5040 cGy in 28 fractions to her distal esophagus using IM RT radiation therapy. I would choose IM RT radiation to avoid critical structures such as her heart lung spinal cord. I would use PET CT fusion study for treatment planning. Will discuss with medical oncology concurrent chemotherapy during radiation.There will be extra effort by both professional staff as well as technical staff to coordinate and manage concurrent chemoradiation and ensuing side effects during her treatments. I've person set up and ordered CT simulation for early next week. Risks and benefits of treatment including increasing possible dysphasia skin reaction fatigue alteration of blood counts all were discussed in detail. Patient and her sister both seem to comprehend my treatment plan well.  I would like to take this opportunity to thank you for allowing me to participate in the care of your patient.Armstead Peaks., MD

## 2017-04-10 ENCOUNTER — Inpatient Hospital Stay: Payer: Medicare Other

## 2017-04-10 ENCOUNTER — Telehealth: Payer: Self-pay | Admitting: Gastroenterology

## 2017-04-10 ENCOUNTER — Encounter: Payer: Self-pay | Admitting: Gastroenterology

## 2017-04-10 LAB — SURGICAL PATHOLOGY

## 2017-04-10 MED ORDER — PROCHLORPERAZINE MALEATE 10 MG PO TABS: 10.0000 mg | ORAL_TABLET | Freq: Four times a day (QID) | ORAL | 1 refills | Status: DC | PRN

## 2017-04-10 MED ORDER — LIDOCAINE-PRILOCAINE 2.5-2.5 % EX CREA: TOPICAL_CREAM | CUTANEOUS | 3 refills | Status: DC

## 2017-04-10 MED ORDER — DEXAMETHASONE 4 MG PO TABS: 8.0000 mg | ORAL_TABLET | Freq: Every day | ORAL | 1 refills | Status: DC

## 2017-04-10 MED ORDER — ONDANSETRON HCL 8 MG PO TABS: 8.0000 mg | ORAL_TABLET | Freq: Two times a day (BID) | ORAL | 1 refills | Status: DC | PRN

## 2017-04-10 NOTE — Telephone Encounter (Signed)
Left voice message for patient to call and schedule a 4 week follow up with Dr. Bonna Gains. Letter sent

## 2017-04-10 NOTE — H&P (Signed)
[facility] Admission History & Physical  MRN : [id number]  [name] is a 82 y.o. (March 14, 1935) female who presents with chief complaint of No chief complaint on file. Marland Kitchen  History of Present Illness: Patient presents today for Port-A-Cath placement.  She has a recently diagnosed esophageal cancer.  Positive for chest pain and swallowing difficulty.  No fever or chills.  No new complaints today.  Was recently discharged from the hospital earlier this month and see that history and physical for full details.  No current facility-administered medications for this encounter.    Current Outpatient Medications  Medication Sig Dispense Refill  . acetaminophen (TYLENOL) 325 MG tablet Take 650 mg by mouth every 6 (six) hours as needed for moderate pain or headache.     . calcium carbonate (TUMS EX) 750 MG chewable tablet Chew 1 tablet by mouth daily.    . Cholecalciferol (VITAMIN D) 2000 UNITS CAPS Take 2,000 Units by mouth daily.    . cyanocobalamin 500 MCG tablet Take 500 mcg every other day by mouth.    . ferrous sulfate 325 (65 FE) MG EC tablet Take 1 tablet (325 mg total) 2 (two) times daily by mouth. 60 tablet 3  . fluticasone (FLONASE) 50 MCG/ACT nasal spray Place 1-2 sprays into both nostrils daily.     . hydrocortisone cream 1 % Apply 1 application topically daily as needed for itching.    . loratadine (CLARITIN) 10 MG tablet Take 10 mg by mouth daily.    Marland Kitchen LYRICA 150 MG capsule TAKE 1 CAPSULE BY MOUTH TWO TIMES DAILY 180 capsule 1  . metoprolol succinate (TOPROL-XL) 50 MG 24 hr tablet TAKE 1 TABLET BY MOUTH DAILY 90 tablet 3  . oxybutynin (DITROPAN) 5 MG tablet TAKE 1/2 TABLET BY MOUTH IN THE MORNING AND 1 TABLET AT BEDTIME    . Polyethyl Glycol-Propyl Glycol (SYSTANE OP) Apply 1 drop to eye daily as needed (dry eyes).    Marland Kitchen senna-docusate (SENOKOT-S) 8.6-50 MG tablet Take 1 tablet at bedtime as needed by mouth for mild constipation.    . traMADol (ULTRAM)  50 MG tablet Take 50 mg by mouth 3 (three) times daily as needed for moderate pain.     . traZODone (DESYREL) 50 MG tablet Take 25 mg by mouth at bedtime.     Marland Kitchen dexamethasone (DECADRON) 4 MG tablet Take 2 tablets (8 mg total) by mouth daily. Start the day after chemotherapy for 2 days. Take with food. 30 tablet 1  . lidocaine-prilocaine (EMLA) cream Apply to affected area once 30 g 3  . ondansetron (ZOFRAN) 8 MG tablet Take 1 tablet (8 mg total) by mouth 2 (two) times daily as needed for refractory nausea / vomiting. Start on day 3 after chemotherapy. 30 tablet 1  . prochlorperazine (COMPAZINE) 10 MG tablet Take 1 tablet (10 mg total) by mouth every 6 (six) hours as needed (Nausea or vomiting). 30 tablet 1    Past Medical History:  Diagnosis Date  . Arthritis   . Asthma   . COPD (chronic obstructive pulmonary disease) ([place])   . Dysphonia   . Dyspnea   . Heart murmur   . History of kidney stones   . Hypertension   . Melanoma ([place])   . OAB (overactive bladder)   . OSA on CPAP   . 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 Pupil asymmetry    R pupil defect  . Skin cancer   .  Sleep apnea   . Spinal cord abscess     Past Surgical History:  Procedure Laterality Date  . ABDOMINAL HYSTERECTOMY    . ANTERIOR CERVICAL DECOMP/DISCECTOMY FUSION N/A 07/16/2013   Procedure: ANTERIOR CERVICAL DECOMPRESSION/DISCECTOMY FUSION mutlipleLEVELS C4-7;  Surgeon: Erline Levine, MD;  Location: [facility];  Service: Neurosurgery;  Laterality: N/A;  . APPENDECTOMY    . carpel tunn Bilateral   . CATARACT EXTRACTION    . CATARACT EXTRACTION, BILATERAL    . CHOLECYSTECTOMY    . dental implant    . ESOPHAGOGASTRODUODENOSCOPY Left 03/21/2017   Procedure: ESOPHAGOGASTRODUODENOSCOPY (EGD);  Surgeon: Virgel Manifold, MD;  Location: [facility];  Service: Endoscopy;  Laterality: Left;  Marland Kitchen GASTRIC BYPASS    . HIATAL HERNIA REPAIR    . MELANOMA EXCISION    . PORTA CATH INSERTION N/A  04/07/2017   Procedure: PORTA CATH INSERTION;  Surgeon: Algernon Huxley, MD;  Location: [facility];  Service: Cardiovascular;  Laterality: N/A;  . POSTERIOR CERVICAL FUSION/FORAMINOTOMY N/A 07/28/2013   Procedure: Cervical four-seven  posterior cervical fusion;  Surgeon: Erline Levine, MD;  Location: [place] [facility];  Service: Neurosurgery;  Laterality: N/A;  . TONSILLECTOMY    . TOTAL KNEE ARTHROPLASTY      Social History Social History   Tobacco Use  . Smoking status: Former Smoker    Packs/day: 1.00    Years: 20.00    Pack years: 20.00    Types: Cigarettes    Last attempt to quit: 1977    Years since quitting: 41.9  . Smokeless tobacco: Never Used  Substance Use Topics  . Alcohol use: No  . Drug use: No    Family History Family History  Problem Relation Age of Onset  . Breast cancer Mother 45  . Arthritis-Osteo Mother   . Breast cancer Sister 62  . Bladder Cancer Sister   . Bladder Cancer Father   . Tongue cancer Father   . Congenital heart disease Father   . Prostate cancer Brother   . Uterine cancer Maternal Aunt   . Lung cancer Paternal Uncle   . Leukemia Maternal Grandmother   . Liver cancer Paternal Grandmother   . Colon cancer Neg Hx     Allergies  Allergen Reactions  . Cefuroxime Nausea Only  . Codeine Nausea Only  . Doxycycline Other (See Comments)    Lip swelling  . Gabapentin Other (See Comments)    Swelling.   . Iohexol Itching    07-15-13 pt developed itching on fingers after contrast given. Dr. Irish Elders looked at pt and said to put in system as allergy. BB  . Other Other (See Comments)    Contrast Dye- caused fever, chills, awful feeling Bandages - itching, rash  . Oxycodone Other (See Comments)    nausea  . Quinolones Swelling    Lip swelling  . Synvisc [[place]ylan G-F 20] Swelling     REVIEW OF SYSTEMS (Negative unless checked)  Constitutional: [] Weight loss  [] Fever  [] Chills Cardiac: [] Chest pain   [] Chest pressure   [] Palpitations    [] Shortness of breath when laying flat   [x] Shortness of breath at rest   [x] Shortness of breath with exertion. Vascular:  [] Pain in legs with walking   [] Pain in legs at rest   [] Pain in legs when laying flat   [] Claudication   [] Pain in feet when walking  [] Pain in feet at rest  [] Pain in feet when laying flat   [] History of DVT   []   Phlebitis   [] Swelling in legs   [] Varicose veins   [] Non-healing ulcers Pulmonary:   [] Uses home oxygen   [] Productive cough   [] Hemoptysis   [] Wheeze  [x] COPD   [] Asthma Neurologic:  [] Dizziness  [] Blackouts   [] Seizures   [] History of stroke   [] History of TIA  [] Aphasia   [] Temporary blindness   [] Dysphagia   [] Weakness or numbness in arms   [] Weakness or numbness in legs Musculoskeletal:  [x] Arthritis   [] Joint swelling   [] Joint pain   [x] Low back pain Hematologic:  [] Easy bruising  [] Easy bleeding   [] Hypercoagulable state   [] Anemic  [] Hepatitis Gastrointestinal:  [] Blood in stool   [] Vomiting blood  [x] Gastroesophageal reflux/heartburn   [x] Difficulty swallowing. Genitourinary:  [] Chronic kidney disease   [] Difficult urination  [] Frequent urination  [] Burning with urination   [] Blood in urine Skin:  [] Rashes   [] Ulcers   [] Wounds Psychological:  [] History of anxiety   []  History of major depression.  Physical Examination  Vitals:   04/07/17 1100 04/07/17 1113 04/07/17 1115 04/07/17 1130  BP: (!) 144/77  125/61 (!) 136/57  Pulse: 64  63 65  Resp: 16  16 20   Temp:      SpO2: (!) 86% (!) 87% 93% 92%  Weight:      Height:       Body mass index is 41.73 kg/m. Gen: WD/WN, NAD.  Obese Head: [place]/AT, No temporalis wasting.  Ear/Nose/Throat: Hearing grossly intact, nares w/o erythema or drainage, oropharynx w/o Erythema/Exudate,  Eyes: Conjunctiva clear, sclera non-icteric Neck: Trachea midline.  No JVD.  Pulmonary:  Good air movement, respirations not labored, no use of accessory muscles.  Cardiac: RRR, no JVD Vascular:  Vessel Right Left  Radial  Palpable Palpable                                    Musculoskeletal: M/S 5/5 throughout.  Extremities without ischemic changes.  No deformity or atrophy.  Neurologic: Sensation grossly intact in extremities.  Symmetrical.  Speech is fluent. Motor exam as listed above. Psychiatric: Judgment intact, Mood & affect appropriate for pt's clinical situation. Dermatologic: No rashes or ulcers noted.  No cellulitis or open wounds.      CBC Lab Results  Component Value Date   WBC 6.7 04/09/2017   HGB 10.3 (L) 04/09/2017   HCT 31.8 (L) 04/09/2017   MCV 92.8 04/09/2017   PLT 223 04/09/2017    BMET    Component Value Date/Time   NA 141 03/27/2017 1232   NA 138 07/13/2013 0609   K 4.2 03/27/2017 1232   K 4.1 07/13/2013 0609   CL 104 03/27/2017 1232   CL 104 07/13/2013 0609   CO2 30 03/27/2017 1232   CO2 29 07/13/2013 0609   GLUCOSE 100 (H) 03/27/2017 1232   GLUCOSE 115 (H) 07/13/2013 0609   BUN 11 03/27/2017 1232   BUN 15 07/13/2013 0609   CREATININE 0.69 03/27/2017 1232   CREATININE 0.67 07/13/2013 0609   CALCIUM 9.2 03/27/2017 1232   CALCIUM 8.3 (L) 07/13/2013 0609   GFRNONAA >60 03/22/2017 0504   GFRNONAA >60 07/13/2013 0609   GFRAA >60 03/22/2017 0504   GFRAA >60 07/13/2013 0609   Estimated Creatinine Clearance: 63.8 mL/min (by C-G formula based on SCr of 0.69 mg/dL).  COAG Lab Results  Component Value Date   INR 0.97 04/09/2017   INR 0.98 03/21/2017   INR 0.91 07/15/2013  Radiology Ct Abdomen Pelvis Wo Contrast  Result Date: 03/20/2017 CLINICAL DATA:  foreign body sensation EXAM: CT ABDOMEN AND PELVIS WITHOUT CONTRAST TECHNIQUE: Multidetector CT imaging of the abdomen and pelvis was performed following the standard protocol without IV contrast. COMPARISON:  06/29/2015 FINDINGS: Lower chest: Lung bases demonstrate no acute consolidation or pleural effusion. Mild cardiomegaly. Mitral calcifications. Hepatobiliary: Interim development of multiple hypodense  liver masses. Question slight nodularity of the liver contour. Surgical absence of the gallbladder. No biliary dilatation. Pancreas: Unremarkable. No pancreatic ductal dilatation or surrounding inflammatory changes. Spleen: Normal in size without focal abnormality. Adrenals/Urinary Tract: Adrenal glands are within normal limits. Staghorn calculus in the lower pole of the left kidney measuring up to 17 mm. No hydronephrosis. Bladder normal. Stomach/Bowel: Status post gastric bypass. Distended gastric pouch filled with fluid and food material which extends into the jejunal limb. No dilated small bowel distal to this is suggest obstruction. No colon wall thickening. Vascular/Lymphatic: Aortic atherosclerosis. Enlarged gastrohepatic lymph node measuring 14 mm. Additional prominent porta hepatis lymph nodes. Reproductive: Status post hysterectomy. No adnexal masses. Other: Negative for free air or free fluid. Musculoskeletal: Degenerative changes of the spine. Grade 1 anterolisthesis of L4 on L5. No acute or suspicious bone lesion is seen. Lucent lesion in the left femoral neck is unchanged. IMPRESSION: 1. Status post gastric bypass with distension of the gastric pouch by fluid and food bolus. There is some extension of the food bolus into the jejunal limb. No dilated small bowel distal to this is suggest obstruction. 2. Interim development of multiple hypo dense liver masses, concerning for metastatic disease or liver primary. Follow-up contrast-enhanced CT and or MRI is recommended. 3. Enlarged gastrohepatic and porta hepatis nodes are concerning for metastatic disease. 4. Staghorn calculus on the left.  No hydronephrosis. Electronically Signed   By: Donavan Foil M.D.   On: 03/20/2017 03:59   Dg Neck Soft Tissue  Result Date: 03/20/2017 CLINICAL DATA:  Feels like food stuck in the throat. Shortness of breath. EXAM: NECK SOFT TISSUES - 1+ VIEW COMPARISON:  Cervical spine 09/24/2013 FINDINGS: There is no evidence of  retropharyngeal soft tissue swelling or epiglottic enlargement. The cervical airway is unremarkable and no radio-opaque foreign body identified. Postoperative changes with anterior and posterior fixation of the cervical spine. IMPRESSION: No radiopaque soft tissue foreign bodies are identified. Postoperative changes in the cervical spine. Electronically Signed   By: Lucienne Capers M.D.   On: 03/20/2017 02:34   Dg Chest 2 View  Result Date: 03/20/2017 CLINICAL DATA:  Feels like food stuck in the throat. Shortness of breath. EXAM: CHEST  2 VIEW COMPARISON:  07/10/2015 FINDINGS: Mild cardiac enlargement. No pulmonary vascular congestion or edema. Peribronchial thickening with central interstitial changes suggesting chronic bronchitis. Linear scarring in the left mid lung. No focal consolidation. No blunting of costophrenic angles. No pneumothorax. Mediastinal contours appear intact. Postoperative changes in the cervical spine. Degenerative changes in the thoracic spine. No apparent esophageal dilatation. IMPRESSION: Cardiac enlargement. Chronic bronchitic changes in the lungs. No evidence of active pulmonary disease. Electronically Signed   By: Lucienne Capers M.D.   On: 03/20/2017 02:35   Nm Pulmonary Perf And Vent  Result Date: 03/20/2017 CLINICAL DATA:  Shortness of Breath EXAM: NUCLEAR MEDICINE VENTILATION - PERFUSION LUNG SCAN VIEWS: Anterior, posterior, left lateral, right lateral, RAO, LAO, RPO, LPO -ventilation and perfusion RADIOPHARMACEUTICALS:  32.8 mCi Technetium-54m DTPA aerosol inhalation and 4.23 mCi Technetium-48m MAA IV COMPARISON:  Chest radiograph March 20 2017 FINDINGS: Ventilation: Radiotracer  uptake on the ventilation study is homogeneous and symmetric. There are no appreciable ventilation defects. Perfusion: Radiotracer uptake on the perfusion study is homogeneous and symmetric. There are no appreciable perfusion defects. IMPRESSION: No appreciable ventilation or perfusion defects.  This study constitutes a very low probability of pulmonary embolus. Electronically Signed   By: Lowella Grip III M.D.   On: 03/20/2017 14:55   Nm Pet Image Initial (pi) Skull Base To Thigh  Result Date: 03/28/2017 CLINICAL DATA:  Initial Treatment strategy for esophageal cancer. EXAM: NUCLEAR MEDICINE PET SKULL BASE TO THIGH TECHNIQUE: 12.2 mCi F-18 FDG was injected intravenously. Full-ring PET imaging was performed from the skull base to thigh after the radiotracer. CT data was obtained and used for attenuation correction and anatomic localization. FASTING BLOOD GLUCOSE:  Value: 101 mg/dl COMPARISON:  CT abdomen 03/20/2017 FINDINGS: NECK No hypermetabolic lymph nodes in the neck. CHEST Distal esophageal and gastric cardia mass, maximum SUV 12.7, with associated wall thickening. No other abnormal hypermetabolic activity in the chest. Mosaic attenuation in the lungs potentially from extrinsic allergic alveolitis or air trapping. Mild cardiomegaly. Coronary, aortic arch, and branch vessel atherosclerotic vascular disease. Small paratracheal lymph nodes are not hypermetabolic or pathologically enlarged by size criteria. ABDOMEN/PELVIS Hypermetabolic gastroesophageal mass as noted above. Hypermetabolic right gastric and gastrohepatic ligament adenopathy, with a dominant right gastric lymph node measuring 1.4 cm in short axis on image 137/3 with maximum SUV 14.4. There scattered metastatic lesions throughout the liver potentially sparing segment 2 and the caudate lobe but otherwise discretely identified in all segments. A dominant lesion in the right hepatic lobe has a maximum SUV of 14.0 and with abnormal accentuated metabolic activity measuring 6.0 by 3.8 cm. Aortoiliac atherosclerotic vascular disease. Postoperative findings in the stomach. Staghorn calculus in the left kidney lower pole measuring 1.7 cm in anterior-posterior axis. SKELETON No focal hypermetabolic activity to suggest skeletal metastasis.  Lower lumbar degenerative facet arthropathy. Thoracolumbar spondylosis. Degenerative left sternoclavicular arthropathy. Lower cervical fusion. Pelvic floor laxity. IMPRESSION: 1. Hypermetabolic mass of the gastroesophageal junction with adjacent right gastric hypermetabolic adenopathy and numerous hypermetabolic hepatic masses involving almost all segments of the liver. 2. Other imaging findings of potential clinical significance: Mosaic attenuation in the lungs, query air trapping or extrinsic allergic alveolitis. Mild cardiomegaly. Aortic Atherosclerosis (ICD10-I70.0). Coronary atherosclerosis. Postoperative findings in the stomach. Left kidney lower pole staghorn calculus. Pelvic floor laxity. Electronically Signed   By: Van Clines M.D.   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 Liver lesion biopsy has been requested to confirm the presence of stage IV disease. EXAM: ULTRASOUND GUIDED CORE BIOPSY OF LIVER MEDICATIONS: 1.0 mg IV Versed; 50 mcg IV Fentanyl Total Moderate Sedation Time: 15 minutes. The patient's level of consciousness and physiologic status were continuously monitored during the procedure by Radiology nursing. PROCEDURE: The procedure, risks, benefits, and alternatives were explained to the patient. Questions regarding the procedure were encouraged and answered. The patient understands and consents to the procedure. A time out was performed prior to initiating the procedure. Ultrasound was performed to localize liver lesions. A lesion in the inferior right lobe was chosen for biopsy. The abdominal wall was prepped with chlorhexidine in a sterile fashion, and a sterile drape was applied covering the operative field. A sterile gown and sterile gloves were used for the procedure. Local anesthesia was provided with 1% Lidocaine. A 17 gauge needle was advanced into the  liver at the  level of a liver lesion. After confirming needle tip position, a total of 3 separate coaxial 18 gauge core biopsy samples were obtained through the mass. Core biopsy samples were submitted in formalin. A slurry of Gel-Foam pledgets was then injected through the outer needle as it was withdrawn. Additional ultrasound was performed. COMPLICATIONS: None. FINDINGS: Multiple lesions were identified throughout the liver. The most accessible for biopsy is an anterior subcapsular lesion within the inferior right lobe measuring approximately 4 cm in greatest diameter. Solid tissue was obtained. IMPRESSION: Ultrasound-guided core biopsy performed of a 4 cm mass within the right lobe of the liver. Electronically Signed   By: Aletta Edouard M.D.   On: 04/09/2017 12:49      Assessment/Plan 1.  Esophageal cancer to cath today.  Risks and benefits discussed and she is agreeable to proceed. 2.  HTN. Stable on outpatient medications and blood pressure control important in reducing the progression of atherosclerotic disease. On appropriate oral medications.     Leotis Pain, MD  04/10/2017 3:02 PM

## 2017-04-10 NOTE — Progress Notes (Addendum)
Hematology/Oncology Follow Up Note [facility]  Telephone:([phone] [phone]  Patient Care Team: Tonia Ghent, MD as PCP - General (Family Medicine)   Name of the patient: [name]  [id number]  Nov 25, 1934   REASON FOR VISIT Follow up of management of esophageal adenocarcinoma  HISTORY OF PRESENT ILLNESS Patient is a 82 year old female with past medical history listed as below who presented with complaints of epigastric discomfort, and sensation of food being stuck. Patient also reports that epigastric pain is aching in nature, and intermittent, for about several months. Associated with weight loss about 20 pounds in the past year. Patient also felt shortness of breath and in the emergency room oxygen saturation will at 83%. CT abdomen showed multiple masses in the liver, unknown etiology. EGD during admission showed partially obstructive esophageal mass at the distal part of esophagus. Biopsy was taken.  Pathology showed esophageal adenocarcinoma. US biopsy of liver lesion to proof distant metastasis.  Report family history of breast cancer, but she has been having routine mammograms. Lives alone. She's a former smoker.  INTERVAL HISTORY Patient presents for evaluation prior to starting chemotherapy. She continues to have left shoulder arthritic pain which improved with one dose of cortisone injection. She also has more pain of her left upper extremity which she may get another steroid injection to relive pain.  She takes tramadol 32m as needed but has nausea. So she is back to 274mPRN however pain is not improved.  Otherwise doing well. Son accompanied her to today's visit.   . Able to eat semi solids, no difficulty with liquids.   Current Pain Regimen:  Tramadol to 5045m6h PRN. She uses half tablet sometimes.    Review of systems Review of Systems  Constitutional: Positive for malaise/fatigue. Negative for chills, fever and  weight loss.  HENT: Negative for hearing loss.   Eyes: Negative for blurred vision and double vision.  Respiratory: Negative for cough.   Cardiovascular: Negative for chest pain.  Gastrointestinal: Positive for nausea. Negative for vomiting.       Dysphagia   Genitourinary: Negative for dysuria.  Musculoskeletal: Negative for myalgias.  Skin: Negative for rash.  Neurological: Negative for dizziness.  Endo/Heme/Allergies: Does not bruise/bleed easily.  Psychiatric/Behavioral: Negative for depression.    Allergies  Allergen Reactions  . Cefuroxime Nausea Only  . Codeine Nausea Only  . Doxycycline Other (See Comments)    Lip swelling  . Gabapentin Other (See Comments)    Swelling.   . Iohexol Itching    07-15-13 pt developed itching on fingers after contrast given. Dr. OlsIrish Eldersoked at pt and said to put in system as allergy. BB  . Other Other (See Comments)    Contrast Dye- caused fever, chills, awful feeling Bandages - itching, rash  . Oxycodone Other (See Comments)    nausea  . Quinolones Swelling    Lip swelling  . Synvisc [Hylan G-F 20] Swelling     Past Medical History:  Diagnosis Date  . Arthritis   . Asthma   . COPD (chronic obstructive pulmonary disease) [place] . Dysphonia   . Dyspnea   . Heart murmur   . History of kidney stones   . Hypertension   . Melanoma ([facility] . OAB (overactive bladder)   . OSA on CPAP   . 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 Pupil asymmetry    R pupil defect  . Skin cancer   .  Sleep apnea   . Spinal cord abscess      Past Surgical History:  Procedure Laterality Date  . ABDOMINAL HYSTERECTOMY    . ANTERIOR CERVICAL DECOMP/DISCECTOMY FUSION N/A 07/16/2013   Procedure: ANTERIOR CERVICAL DECOMPRESSION/DISCECTOMY FUSION mutlipleLEVELS C4-7;  Surgeon: Erline Levine, MD;  Location: [facility];  Service: Neurosurgery;  Laterality: N/A;  . APPENDECTOMY    . carpel tunn Bilateral   . CATARACT EXTRACTION    .  CATARACT EXTRACTION, BILATERAL    . CHOLECYSTECTOMY    . dental implant    . ESOPHAGOGASTRODUODENOSCOPY Left 03/21/2017   Procedure: ESOPHAGOGASTRODUODENOSCOPY (EGD);  Surgeon: Virgel Manifold, MD;  Location[facility] ENDOSCOPY;  Service: Endoscopy;  Laterality: Left;  Marland Kitchen GASTRIC BYPASS    . HIATAL HERNIA REPAIR    . MELANOMA EXCISION    . PORTA CATH INSERTION N/A 04/07/2017   Procedure: PORTA CATH INSERTION;  Surgeon: Algernon Huxley, MD;  Location: [facility] CV LAB;  Service: Cardiovascular;  Laterality: N/A;  . POSTERIOR CERVICAL FUSION/FORAMINOTOMY N/A 07/28/2013   Procedure: Cervical four-seven  posterior cervical fusion;  Surgeon: Erline Levine, MD;  Location: [facility];  Service: Neurosurgery;  Laterality: N/A;  . TONSILLECTOMY    . TOTAL KNEE ARTHROPLASTY      Social History   Socioeconomic History  . Marital status: Widowed    Spouse name: Not on file  . Number of children: 2  . Years of education: Not on file  . Highest education level: Master's degree (e.g., MA, MS, MEng, MEd, MSW, MBA)  Social Needs  . Financial resource strain: Not hard at all  . Food insecurity - worry: Never true  . Food insecurity - inability: Never true  . Transportation needs - medical: No  . Transportation needs - non-medical: No  Occupational History  . Not on file  Tobacco Use  . Smoking status: Former Smoker    Packs/day: 1.00    Years: 20.00    Pack years: 20.00    Types: Cigarettes    Last attempt to quit: 1977    Years since quitting: 41.9  . Smokeless tobacco: Never Used  Substance and Sexual Activity  . Alcohol use: No  . Drug use: No  . Sexual activity: No  Other Topics Concern  . Not on file  Social History Narrative   Marital Status- Widowed    Lives by herself    Employement- Retired Pharmacist, hospital   Exercise hx- Does PT     Family History  Problem Relation Age of Onset  . Breast cancer Mother 43  . Arthritis-Osteo Mother   . Breast cancer Sister 28  . Bladder Cancer  Sister   . Bladder Cancer Father   . Tongue cancer Father   . Congenital heart disease Father   . Prostate cancer Brother   . Uterine cancer Maternal Aunt   . Lung cancer Paternal Uncle   . Leukemia Maternal Grandmother   . Liver cancer Paternal Grandmother   . Colon cancer Neg Hx      Current Outpatient Medications:  .  acetaminophen (TYLENOL) 325 MG tablet, Take 650 mg by mouth every 6 (six) hours as needed for moderate pain or headache. , Disp: , Rfl:  .  calcium carbonate (TUMS EX) 750 MG chewable tablet, Chew 1 tablet by mouth daily., Disp: , Rfl:  .  Cholecalciferol (VITAMIN D) 2000 UNITS CAPS, Take 2,000 Units by mouth daily., Disp: , Rfl:  .  cyanocobalamin 500 MCG tablet, Take 500 mcg  every other day by mouth., Disp: , Rfl:  .  dexamethasone (DECADRON) 4 MG tablet, Take 2 tablets (8 mg total) by mouth daily. Start the day after chemotherapy for 2 days. Take with food., Disp: 30 tablet, Rfl: 1 .  ferrous sulfate 325 (65 FE) MG EC tablet, Take 1 tablet (325 mg total) 2 (two) times daily by mouth., Disp: 60 tablet, Rfl: 3 .  fluticasone (FLONASE) 50 MCG/ACT nasal spray, Place 1-2 sprays into both nostrils daily. , Disp: , Rfl:  .  hydrocortisone cream 1 %, Apply 1 application topically daily as needed for itching., Disp: , Rfl:  .  lidocaine-prilocaine (EMLA) cream, Apply to affected area once, Disp: 30 g, Rfl: 3 .  loratadine (CLARITIN) 10 MG tablet, Take 10 mg by mouth daily., Disp: , Rfl:  .  LYRICA 150 MG capsule, TAKE 1 CAPSULE BY MOUTH TWO TIMES DAILY, Disp: 180 capsule, Rfl: 1 .  metoprolol succinate (TOPROL-XL) 50 MG 24 hr tablet, TAKE 1 TABLET BY MOUTH DAILY, Disp: 90 tablet, Rfl: 3 .  ondansetron (ZOFRAN) 8 MG tablet, Take 1 tablet (8 mg total) by mouth 2 (two) times daily as needed for refractory nausea / vomiting. Start on day 3 after chemotherapy., Disp: 30 tablet, Rfl: 1 .  oxybutynin (DITROPAN) 5 MG tablet, TAKE 1/2 TABLET BY MOUTH IN THE MORNING AND 1 TABLET AT  BEDTIME, Disp: , Rfl:  .  Polyethyl Glycol-Propyl Glycol (SYSTANE OP), Apply 1 drop to eye daily as needed (dry eyes)., Disp: , Rfl:  .  prochlorperazine (COMPAZINE) 10 MG tablet, Take 1 tablet (10 mg total) by mouth every 6 (six) hours as needed (Nausea or vomiting)., Disp: 30 tablet, Rfl: 1 .  senna-docusate (SENOKOT-S) 8.6-50 MG tablet, Take 1 tablet at bedtime as needed by mouth for mild constipation., Disp: , Rfl:  .  traMADol (ULTRAM) 50 MG tablet, Take 50 mg by mouth 3 (three) times daily as needed for moderate pain. , Disp: , Rfl:  .  traZODone (DESYREL) 50 MG tablet, Take 25 mg by mouth at bedtime. , Disp: , Rfl:   Physical exam:  Vitals:   04/14/17 0949  BP: 135/79  Pulse: 61  Resp: 18  Temp: 98.2 F (36.8 C)  TempSrc: Tympanic  Weight: 230 lb (104.3 kg)   Physical Exam  Constitutional: She is oriented to person, place, and time and well-developed, well-nourished, and in no distress. No distress.  HENT:  Head: Normocephalic and atraumatic.  Eyes: EOM are normal. Pupils are equal, round, and reactive to light.  Neck: Normal range of motion.  Cardiovascular: Normal rate and regular rhythm.  Murmur heard. Pulmonary/Chest: Effort normal and breath sounds normal.  Abdominal: Soft. Bowel sounds are normal. She exhibits no distension.  Musculoskeletal: Normal range of motion.  Neurological: She is alert and oriented to person, place, and time.  Skin: Skin is warm and dry.    CMP Latest Ref Rng & Units 03/27/2017  Glucose 70 - 99 mg/dL 100(H)  BUN 6 - 23 mg/dL 11  Creatinine 0.40 - 1.20 mg/dL 0.69  Sodium 135 - 145 mEq/L 141  Potassium 3.5 - 5.1 mEq/L 4.2  Chloride 96 - 112 mEq/L 104  CO2 19 - 32 mEq/L 30  Calcium 8.4 - 10.5 mg/dL 9.2  Total Protein 6.5 - 8.1 g/dL -  Total Bilirubin 0.3 - 1.2 mg/dL -  Alkaline Phos 38 - 126 U/L -  AST 15 - 41 U/L -  ALT 14 - 54 U/L -  CBC Latest Ref Rng & Units 04/09/2017  WBC 3.6 - 11.0 K/uL 6.7  Hemoglobin 12.0 - 16.0 g/dL  10.3(L)  Hematocrit 35.0 - 47.0 % 31.8(L)  Platelets 150 - 440 K/uL 223     Assessment and plan  Cancer Staging Malignant neoplasm of lower third of esophagus [facility]) Staging form: Esophagus - Adenocarcinoma, AJCC 8th Edition - Clinical stage from 04/10/2017: Stage IVB (cTX, cNX, pM1) - Signed by Earlie Server, MD on 04/10/2017  1. Malignant neoplasm of lower third of esophagus [facility])    Pathology of US biopsy  discussed with patient and her son. She has stage IV esophageal adenocarcinoma. Foundation one test, PD-L1 and HER2 staining tests were sent. Awaiting results.   # Iron deficiency anemia: to complete 4 doses of IV iron with Venofer 248m weekly. Hb imporving # Follow up with Dr.Chrystal for palliative RT to resolving dysphagia. # Pre-existing peripheral neuropathy: continue Lyrica, Oxaliplatin dose were reduced to 676mm2 Continue monitor.   Follow up in 1 week to evaluate toxicity.  Schedule Lab/MD/FOLFOX in 2 weeks.   ZhEarlie ServerMD, PhD Hematology Oncology CoPrisma Health [facility]- [phone]

## 2017-04-14 ENCOUNTER — Encounter: Payer: Self-pay | Admitting: Oncology

## 2017-04-14 ENCOUNTER — Telehealth: Payer: Self-pay | Admitting: Oncology

## 2017-04-14 ENCOUNTER — Inpatient Hospital Stay: Payer: Medicare Other | Admitting: Oncology

## 2017-04-14 ENCOUNTER — Inpatient Hospital Stay: Payer: Medicare Other

## 2017-04-14 ENCOUNTER — Ambulatory Visit: Payer: Medicare Other

## 2017-04-14 ENCOUNTER — Inpatient Hospital Stay: Payer: Medicare Other | Attending: Oncology

## 2017-04-14 VITALS — BP 135/79 | HR 61 | Temp 98.2°F | Resp 18 | Wt 230.0 lb

## 2017-04-14 DIAGNOSIS — G629 Polyneuropathy, unspecified: Secondary | ICD-10-CM | POA: Insufficient documentation

## 2017-04-14 DIAGNOSIS — C155 Malignant neoplasm of lower third of esophagus: Secondary | ICD-10-CM

## 2017-04-14 DIAGNOSIS — R5381 Other malaise: Secondary | ICD-10-CM | POA: Insufficient documentation

## 2017-04-14 DIAGNOSIS — Z9989 Dependence on other enabling machines and devices: Secondary | ICD-10-CM | POA: Insufficient documentation

## 2017-04-14 DIAGNOSIS — D500 Iron deficiency anemia secondary to blood loss (chronic): Secondary | ICD-10-CM

## 2017-04-14 DIAGNOSIS — M19012 Primary osteoarthritis, left shoulder: Secondary | ICD-10-CM | POA: Insufficient documentation

## 2017-04-14 DIAGNOSIS — Z87891 Personal history of nicotine dependence: Secondary | ICD-10-CM

## 2017-04-14 DIAGNOSIS — I351 Nonrheumatic aortic (valve) insufficiency: Secondary | ICD-10-CM | POA: Diagnosis not present

## 2017-04-14 DIAGNOSIS — G4733 Obstructive sleep apnea (adult) (pediatric): Secondary | ICD-10-CM | POA: Diagnosis not present

## 2017-04-14 DIAGNOSIS — Z85820 Personal history of malignant melanoma of skin: Secondary | ICD-10-CM | POA: Insufficient documentation

## 2017-04-14 DIAGNOSIS — Z803 Family history of malignant neoplasm of breast: Secondary | ICD-10-CM

## 2017-04-14 DIAGNOSIS — Z5111 Encounter for antineoplastic chemotherapy: Secondary | ICD-10-CM | POA: Diagnosis not present

## 2017-04-14 DIAGNOSIS — R1310 Dysphagia, unspecified: Secondary | ICD-10-CM | POA: Diagnosis not present

## 2017-04-14 DIAGNOSIS — J449 Chronic obstructive pulmonary disease, unspecified: Secondary | ICD-10-CM | POA: Diagnosis not present

## 2017-04-14 DIAGNOSIS — Z79899 Other long term (current) drug therapy: Secondary | ICD-10-CM | POA: Diagnosis not present

## 2017-04-14 DIAGNOSIS — Z9884 Bariatric surgery status: Secondary | ICD-10-CM | POA: Insufficient documentation

## 2017-04-14 DIAGNOSIS — Z808 Family history of malignant neoplasm of other organs or systems: Secondary | ICD-10-CM

## 2017-04-14 DIAGNOSIS — I10 Essential (primary) hypertension: Secondary | ICD-10-CM

## 2017-04-14 DIAGNOSIS — D509 Iron deficiency anemia, unspecified: Secondary | ICD-10-CM

## 2017-04-14 DIAGNOSIS — R634 Abnormal weight loss: Secondary | ICD-10-CM | POA: Diagnosis not present

## 2017-04-14 DIAGNOSIS — K769 Liver disease, unspecified: Secondary | ICD-10-CM

## 2017-04-14 DIAGNOSIS — Z801 Family history of malignant neoplasm of trachea, bronchus and lung: Secondary | ICD-10-CM | POA: Insufficient documentation

## 2017-04-14 DIAGNOSIS — Z8052 Family history of malignant neoplasm of bladder: Secondary | ICD-10-CM | POA: Insufficient documentation

## 2017-04-14 DIAGNOSIS — K209 Esophagitis, unspecified: Secondary | ICD-10-CM | POA: Diagnosis not present

## 2017-04-14 DIAGNOSIS — Z800 Family history of malignant neoplasm of digestive organs: Secondary | ICD-10-CM | POA: Insufficient documentation

## 2017-04-14 DIAGNOSIS — Z806 Family history of leukemia: Secondary | ICD-10-CM | POA: Insufficient documentation

## 2017-04-14 DIAGNOSIS — R5383 Other fatigue: Secondary | ICD-10-CM

## 2017-04-14 DIAGNOSIS — Z87442 Personal history of urinary calculi: Secondary | ICD-10-CM | POA: Insufficient documentation

## 2017-04-14 DIAGNOSIS — K5900 Constipation, unspecified: Secondary | ICD-10-CM | POA: Diagnosis not present

## 2017-04-14 DIAGNOSIS — M25512 Pain in left shoulder: Secondary | ICD-10-CM | POA: Insufficient documentation

## 2017-04-14 DIAGNOSIS — R1013 Epigastric pain: Secondary | ICD-10-CM | POA: Diagnosis not present

## 2017-04-14 DIAGNOSIS — R110 Nausea: Secondary | ICD-10-CM | POA: Insufficient documentation

## 2017-04-14 LAB — COMPREHENSIVE METABOLIC PANEL
ALT: 9 U/L — AB (ref 14–54)
AST: 22 U/L (ref 15–41)
Albumin: 3.4 g/dL — ABNORMAL LOW (ref 3.5–5.0)
Alkaline Phosphatase: 62 U/L (ref 38–126)
Anion gap: 10 (ref 5–15)
BILIRUBIN TOTAL: 0.5 mg/dL (ref 0.3–1.2)
BUN: 13 mg/dL (ref 6–20)
CHLORIDE: 101 mmol/L (ref 101–111)
CO2: 28 mmol/L (ref 22–32)
CREATININE: 0.85 mg/dL (ref 0.44–1.00)
Calcium: 8.9 mg/dL (ref 8.9–10.3)
GFR calc Af Amer: >60 mL/min (ref >60)
GLUCOSE: 143 mg/dL — AB (ref 65–99)
Potassium: 4.2 mmol/L (ref 3.5–5.1)
Sodium: 139 mmol/L (ref 135–145)
TOTAL PROTEIN: 7 g/dL (ref 6.5–8.1)

## 2017-04-14 LAB — CBC WITH DIFFERENTIAL/PLATELET
BASOS PCT: 1 %
Basophils Absolute: 0 10*3/uL (ref 0–0.1)
EOS ABS: 0.2 10*3/uL (ref 0–0.7)
Eosinophils Relative: 3 %
HCT: 30.8 % — ABNORMAL LOW (ref 35.0–47.0)
HEMOGLOBIN: 10 g/dL — AB (ref 12.0–16.0)
Lymphocytes Relative: 17 %
Lymphs Abs: 1.1 10*3/uL (ref 1.0–3.6)
MCH: 30 pg (ref 26.0–34.0)
MCHC: 32.4 g/dL (ref 32.0–36.0)
MCV: 92.6 fL (ref 80.0–100.0)
Monocytes Absolute: 0.6 10*3/uL (ref 0.2–0.9)
Monocytes Relative: 9 %
NEUTROS PCT: 70 %
Neutro Abs: 4.8 10*3/uL (ref 1.4–6.5)
PLATELETS: 228 10*3/uL (ref 150–440)
RBC: 3.33 MIL/uL — ABNORMAL LOW (ref 3.80–5.20)
RDW: 14.3 % (ref 11.5–14.5)
WBC: 6.7 10*3/uL (ref 3.6–11.0)

## 2017-04-14 MED ORDER — DEXAMETHASONE SODIUM PHOSPHATE 10 MG/ML IJ SOLN
10.0000 mg | Freq: Once | INTRAMUSCULAR | Status: AC
Start: 2017-04-14 — End: 2017-04-14
  Administered 2017-04-14: 10 mg via INTRAVENOUS
  Filled 2017-04-14: qty 1

## 2017-04-14 MED ORDER — SODIUM CHLORIDE 0.9% FLUSH
10.0000 mL | INTRAVENOUS | Status: DC | PRN
  Filled 2017-04-14: qty 10

## 2017-04-14 MED ORDER — LEUCOVORIN CALCIUM INJECTION 350 MG
850.0000 mg | Freq: Once | INTRAVENOUS | Status: AC
  Administered 2017-04-14: 850 mg via INTRAVENOUS
  Filled 2017-04-14: qty 42.5

## 2017-04-14 MED ORDER — PALONOSETRON HCL INJECTION 0.25 MG/5ML
0.2500 mg | Freq: Once | INTRAVENOUS | Status: AC
  Administered 2017-04-14: 0.25 mg via INTRAVENOUS
  Filled 2017-04-14: qty 5

## 2017-04-14 MED ORDER — IRON SUCROSE 20 MG/ML IV SOLN
200.0000 mg | Freq: Once | INTRAVENOUS | Status: AC
  Administered 2017-04-14: 200 mg via INTRAVENOUS
  Filled 2017-04-14: qty 10

## 2017-04-14 MED ORDER — SODIUM CHLORIDE 0.9 % IV SOLN
2400.0000 mg/m2 | INTRAVENOUS | Status: DC
  Administered 2017-04-14: 5200 mg via INTRAVENOUS
  Filled 2017-04-14: qty 104

## 2017-04-14 MED ORDER — OXALIPLATIN CHEMO INJECTION 100 MG/20ML
65.0000 mg/m2 | Freq: Once | INTRAVENOUS | Status: AC
  Administered 2017-04-14: 140 mg via INTRAVENOUS
  Filled 2017-04-14: qty 8

## 2017-04-14 MED ORDER — DEXTROSE 5 % IV SOLN
Freq: Once | INTRAVENOUS | Status: AC
  Administered 2017-04-14: 11:00:00 via INTRAVENOUS
  Filled 2017-04-14: qty 1000

## 2017-04-14 MED ORDER — TRAMADOL HCL 50 MG PO TABS: 50.0000 mg | ORAL_TABLET | Freq: Three times a day (TID) | ORAL | 1 refills | Status: DC | PRN

## 2017-04-14 MED ORDER — FLUOROURACIL CHEMO INJECTION 2.5 GM/50ML
400.0000 mg/m2 | Freq: Once | INTRAVENOUS | Status: AC
  Administered 2017-04-14: 850 mg via INTRAVENOUS
  Filled 2017-04-14: qty 17

## 2017-04-14 MED ORDER — TRAMADOL HCL 50 MG PO TABS: 50.0000 mg | ORAL_TABLET | Freq: Three times a day (TID) | ORAL | 3 refills | Status: DC | PRN

## 2017-04-14 MED ORDER — SODIUM CHLORIDE 0.9 % IV SOLN: 10.0000 mg | Freq: Once | INTRAVENOUS | Status: DC

## 2017-04-14 MED ORDER — LOPERAMIDE HCL 2 MG PO CAPS: 2.0000 mg | ORAL_CAPSULE | ORAL | 0 refills | Status: DC

## 2017-04-14 MED ORDER — SODIUM CHLORIDE 0.9 % IV SOLN
Freq: Once | INTRAVENOUS | Status: AC
  Administered 2017-04-14: 14:00:00 via INTRAVENOUS
  Filled 2017-04-14: qty 1000

## 2017-04-14 NOTE — Telephone Encounter (Signed)
Patient's previously scheduled Venofer appt was adjusted to accommodate Folfox and MD appts. Patient given copy of updated schd and AVS report.

## 2017-04-14 NOTE — Progress Notes (Signed)
Nutrition Assessment   Reason for Assessment:   Weight loss, esophageal mass  ASSESSMENT:  82 year old female with stage IV esophageal cancer with liver mets.  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    Met with patient and son during infusion this am.  Patient reports she typically eats cereal with lactaid milk (lets cereal soak) and sometimes eggs for breakfast. Reports mostly eating soups for lunch (potato, chili with beans) and for dinner goes out to eat with son and has a meat and vegetable.  Reports that she tends to eat softer foods with esophageal mass.  Does not feel that she will tolerate ensure/boost with lactose issues.    Son is physical therapist at [employer].    Nutrition Focused Physical Exam: deferred  Medications: Vit D, Fe sulfate, senokot, compazine, zofran, cyanocobalamin, decadron,   Labs: glucose 143  Anthropometrics:   Height: 62 inches Weight: 232 lb 2.3 oz UBW: 257 lb in the last year  BMI: 42  9% weight loss in the last year.    Estimated Energy Needs  Kcals: 4825-0037 calories/d Protein: 75-100 g/d Fluid: 2.3 L.d  NUTRITION DIAGNOSIS: Inadequate oral intake related to esophageal mass as evidenced by  9% weight loss in the last year and eating semi solid foods.   MALNUTRITION DIAGNOSIS: continue to monitor   INTERVENTION:   Discussed importance of nutrition during treatment.   Discussed soft, moist protein foods to consume and list of foods provided.   Provided recipes for high calorie, high protein shakes from [employer].   Patient could benefit from bariatric specific MVI with history of gastric bypass. Contact information provided    MONITORING, EVALUATION, GOAL: weight trends, intake   NEXT VISIT: December 17 during infusion  Ra Pfiester B. Zenia Resides, [place], [place] Registered Dietitian [phone] (pager)

## 2017-04-14 NOTE — Progress Notes (Signed)
Patient here for follow up with labs and first treatment of Folfox. She is requesting a refill on her Tramadol today. She has some questions regarding pain medication and steroids after chemo.

## 2017-04-15 ENCOUNTER — Encounter: Payer: Self-pay | Admitting: Gastroenterology

## 2017-04-15 ENCOUNTER — Ambulatory Visit
Admission: RE | Admit: 2017-04-15 | Discharge: 2017-04-15 | Disposition: A | Discharge status: Home / Self Care | Payer: Medicare Other | Source: Ambulatory Visit | Attending: Radiation Oncology | Admitting: Radiation Oncology

## 2017-04-15 ENCOUNTER — Inpatient Hospital Stay: Payer: Medicare Other

## 2017-04-15 DIAGNOSIS — C155 Malignant neoplasm of lower third of esophagus: Secondary | ICD-10-CM | POA: Diagnosis not present

## 2017-04-16 ENCOUNTER — Inpatient Hospital Stay: Payer: Medicare Other

## 2017-04-16 ENCOUNTER — Other Ambulatory Visit: Payer: Self-pay | Admitting: *Deleted

## 2017-04-16 ENCOUNTER — Other Ambulatory Visit: Payer: Self-pay | Admitting: Family Medicine

## 2017-04-16 VITALS — BP 144/80 | HR 66 | Temp 97.8°F | Resp 20

## 2017-04-16 DIAGNOSIS — C155 Malignant neoplasm of lower third of esophagus: Secondary | ICD-10-CM | POA: Diagnosis not present

## 2017-04-16 MED ORDER — HEPARIN SOD (PORK) LOCK FLUSH 100 UNIT/ML IV SOLN
500.0000 [IU] | Freq: Once | INTRAVENOUS | Status: AC | PRN
  Administered 2017-04-16: 500 [IU]
  Filled 2017-04-16: qty 5

## 2017-04-16 MED ORDER — SODIUM CHLORIDE 0.9% FLUSH
10.0000 mL | INTRAVENOUS | Status: DC | PRN
  Administered 2017-04-16: 10 mL
  Filled 2017-04-16: qty 10

## 2017-04-16 MED ORDER — TRAMADOL HCL 50 MG PO TABS: 50.0000 mg | ORAL_TABLET | Freq: Three times a day (TID) | ORAL | 1 refills | Status: DC | PRN

## 2017-04-16 NOTE — Telephone Encounter (Signed)
Rx singed and faxed to pharmacy

## 2017-04-16 NOTE — Telephone Encounter (Signed)
Electronic refill Documented 06/30/13 Last office visit 03/27/17

## 2017-04-16 NOTE — Telephone Encounter (Addendum)
Patient nver received copy of prescription for Tramadol Monday. Reprinted and will fax

## 2017-04-17 NOTE — Telephone Encounter (Signed)
Sent. Thanks.   

## 2017-04-22 ENCOUNTER — Inpatient Hospital Stay: Payer: Medicare Other

## 2017-04-22 NOTE — Progress Notes (Addendum)
Hematology/Oncology Follow Up Note [facility]  Telephone:([phone] Fax:([phone]) [phone]  Patient Care Team: Tonia Ghent, MD as PCP - General (Family Medicine)   Name of the patient: [name]  [id number]  [birth year]   REASON FOR VISIT Follow up of management of esophageal adenocarcinoma in the management of iron deficiency anemia.  HISTORY OF PRESENT ILLNESS Patient is a 82 year old female with past medical history listed as below who presented with complaints of epigastric discomfort, and sensation of food being stuck. Patient also reports that epigastric pain is aching in nature, and intermittent, for about several months. Associated with weight loss about 20 pounds in the past year. Patient also felt shortness of breath and in the emergency room oxygen saturation will at 83%. CT abdomen showed multiple masses in the liver, unknown etiology. EGD during admission showed partially obstructive esophageal mass at the distal part of esophagus. Biopsy was taken.  Pathology showed esophageal adenocarcinoma. US biopsy of liver lesion to proof distant metastasis.  Report family history of breast cancer, but she has been having routine mammograms. Lives alone. She's a former smoker.  INTERVAL HISTORY Patient presents for evaluation after cycle 1 chemotherapy. She reports feeling well until day 4 of the cycle, she started experiencing nausea. She took Zofran which eventually helps with the nausea. She has constipation and she has been taking Senokot. Last Friday she had a big bowel movement with hard stool at the beginning and loose stool later. She wasn't sure whether she is having diarrhea and not so she stopped taking senna. Since then she hasn't had any bowel movement. She continues to have left shoulder arthritic pain. She was advised to take tramadol 50 mg as needed and take the nausea medication. She appears be still taking the 25 mg tramadol as needed and  the pain is not well controlled. Otherwise doing well. She is able to eat semisolids, no difficulty. She denies any problems with IV iron infusion. His energy is slightly better. Current Pain Regimen:  Tramadol to 63m Q6h PRN. She uses half tablet sometimes.    Review of systems Review of Systems  Constitutional: Positive for malaise/fatigue. Negative for chills, fever and weight loss.  HENT: Negative for hearing loss and tinnitus.   Eyes: Negative for blurred vision, double vision and photophobia.  Respiratory: Negative for cough.   Cardiovascular: Negative for chest pain and palpitations.  Gastrointestinal: Positive for nausea. Negative for vomiting.       Dysphagia   Genitourinary: Negative for dysuria and urgency.  Musculoskeletal: Negative for myalgias.  Skin: Negative for rash.  Neurological: Negative for dizziness and headaches.  Endo/Heme/Allergies: Negative for environmental allergies. Does not bruise/bleed easily.  Psychiatric/Behavioral: Negative for depression and hallucinations.    Allergies  Allergen Reactions  . Cefuroxime Nausea Only  . Codeine Nausea Only  . Doxycycline Other (See Comments)    Lip swelling  . Gabapentin Other (See Comments)    Swelling.   . Iohexol Itching    07-15-13 pt developed itching on fingers after contrast given. Dr. OIrish Elderslooked at pt and said to put in system as allergy. BB  . Other Other (See Comments)    Contrast Dye- caused fever, chills, awful feeling Bandages - itching, rash  . Oxycodone Other (See Comments)    nausea  . Quinolones Swelling    Lip swelling  . Synvisc [Hylan G-F 20] Swelling     Past Medical History:  Diagnosis Date  . Arthritis   .  Asthma   . COPD (chronic obstructive pulmonary disease) ([place])   . Dysphonia   . Dyspnea   . Heart murmur   . History of kidney stones   . Hypertension   . Melanoma ([place])   . OAB (overactive bladder)   . OSA on CPAP   . Paresthesia    from neck down after spinal  abscess  . Pupil asymmetry    R pupil defect  . Skin cancer   . Sleep apnea   . Spinal cord abscess      Past Surgical History:  Procedure Laterality Date  . ABDOMINAL HYSTERECTOMY    . ANTERIOR CERVICAL DECOMP/DISCECTOMY FUSION N/A 07/16/2013   Procedure: ANTERIOR CERVICAL DECOMPRESSION/DISCECTOMY FUSION mutlipleLEVELS C4-7;  Surgeon: Erline Levine, MD;  Location: [place] NEURO ORS;  Service: Neurosurgery;  Laterality: N/A;  . APPENDECTOMY    . carpel tunn Bilateral   . CATARACT EXTRACTION    . CATARACT EXTRACTION, BILATERAL    . CHOLECYSTECTOMY    . dental implant    . ESOPHAGOGASTRODUODENOSCOPY Left 03/21/2017   Procedure: ESOPHAGOGASTRODUODENOSCOPY (EGD);  Surgeon: Virgel Manifold, MD;  Location: [facility] ENDOSCOPY;  Service: Endoscopy;  Laterality: Left;  Marland Kitchen GASTRIC BYPASS    . HIATAL HERNIA REPAIR    . MELANOMA EXCISION    . PORTA CATH INSERTION N/A 04/07/2017   Procedure: PORTA CATH INSERTION;  Surgeon: Algernon Huxley, MD;  Location: [place] CV LAB;  Service: Cardiovascular;  Laterality: N/A;  . POSTERIOR CERVICAL FUSION/FORAMINOTOMY N/A 07/28/2013   Procedure: Cervical four-seven  posterior cervical fusion;  Surgeon: Erline Levine, MD;  Location: [place] NEURO ORS;  Service: Neurosurgery;  Laterality: N/A;  . TONSILLECTOMY    . TOTAL KNEE ARTHROPLASTY      Social History   Socioeconomic History  . Marital status: Widowed    Spouse name: Not on file  . Number of children: 2  . Years of education: Not on file  . Highest education level: Master's degree (e.g., MA, MS, MEng, MEd, MSW, MBA)  Social Needs  . Financial resource strain: Not hard at all  . Food insecurity - worry: Never true  . Food insecurity - inability: Never true  . Transportation needs - medical: No  . Transportation needs - non-medical: No  Occupational History  . Not on file  Tobacco Use  . Smoking status: Former Smoker    Packs/day: 1.00    Years: 20.00    Pack years: 20.00    Types: Cigarettes     Last attempt to quit: 1977    Years since quitting: 41.9  . Smokeless tobacco: Never Used  Substance and Sexual Activity  . Alcohol use: No  . Drug use: No  . Sexual activity: No  Other Topics Concern  . Not on file  Social History Narrative   Marital Status- Widowed    Lives by herself    Employement- Retired Pharmacist, hospital   Exercise hx- Does PT     Family History  Problem Relation Age of Onset  . Breast cancer Mother 15  . Arthritis-Osteo Mother   . Breast cancer Sister 89  . Bladder Cancer Sister   . Bladder Cancer Father   . Tongue cancer Father   . Congenital heart disease Father   . Prostate cancer Brother   . Uterine cancer Maternal Aunt   . Lung cancer Paternal Uncle   . Leukemia Maternal Grandmother   . Liver cancer Paternal Grandmother   . Colon cancer Neg Hx  Current Outpatient Medications:  .  acetaminophen (TYLENOL) 325 MG tablet, Take 650 mg by mouth every 6 (six) hours as needed for moderate pain or headache. , Disp: , Rfl:  .  calcium carbonate (TUMS EX) 750 MG chewable tablet, Chew 1 tablet by mouth daily., Disp: , Rfl:  .  Cholecalciferol (VITAMIN D) 2000 UNITS CAPS, Take 2,000 Units by mouth daily., Disp: , Rfl:  .  cyanocobalamin 500 MCG tablet, Take 500 mcg every other day by mouth., Disp: , Rfl:  .  dexamethasone (DECADRON) 4 MG tablet, Take 2 tablets (8 mg total) by mouth daily. Start the day after chemotherapy for 2 days. Take with food. (Patient not taking: Reported on 04/14/2017), Disp: 30 tablet, Rfl: 1 .  fluticasone (FLONASE) 50 MCG/ACT nasal spray, Place 1-2 sprays into both nostrils daily. , Disp: , Rfl:  .  hydrocortisone cream 1 %, Apply 1 application topically daily as needed for itching. , Disp: , Rfl:  .  lidocaine-prilocaine (EMLA) cream, Apply to affected area once, Disp: 30 g, Rfl: 3 .  loperamide (IMODIUM) 2 MG capsule, Take 1 capsule (2 mg total) by mouth See admin instructions. With onset of loose stool, take 67m followed by 274mevery  2 hours until 12 hours have passed without loose bowel movement. Maximum: 16 mg/day, Disp: 120 capsule, Rfl: 0 .  loratadine (CLARITIN) 10 MG tablet, Take 10 mg by mouth daily., Disp: , Rfl:  .  LYRICA 150 MG capsule, TAKE 1 CAPSULE BY MOUTH TWO TIMES DAILY, Disp: 180 capsule, Rfl: 1 .  metoprolol succinate (TOPROL-XL) 50 MG 24 hr tablet, TAKE 1 TABLET BY MOUTH DAILY, Disp: 90 tablet, Rfl: 3 .  ondansetron (ZOFRAN) 8 MG tablet, Take 1 tablet (8 mg total) by mouth 2 (two) times daily as needed for refractory nausea / vomiting. Start on day 3 after chemotherapy., Disp: 30 tablet, Rfl: 1 .  oxybutynin (DITROPAN) 5 MG tablet, TAKE 1/2 TABLET BY MOUTH IN THE MORNING AND 1 TABLET AT BEDTIME, Disp: , Rfl:  .  Polyethyl Glycol-Propyl Glycol (SYSTANE OP), Apply 1 drop to eye daily as needed (dry eyes)., Disp: , Rfl:  .  prochlorperazine (COMPAZINE) 10 MG tablet, Take 1 tablet (10 mg total) by mouth every 6 (six) hours as needed (Nausea or vomiting)., Disp: 30 tablet, Rfl: 1 .  senna-docusate (SENOKOT-S) 8.6-50 MG tablet, Take 1 tablet at bedtime as needed by mouth for mild constipation. (Patient taking differently: Take 1 tablet by mouth at bedtime as needed for mild constipation. ), Disp: , Rfl:  .  traMADol (ULTRAM) 50 MG tablet, Take 1 tablet (50 mg total) by mouth 3 (three) times daily as needed for moderate pain., Disp: 30 tablet, Rfl: 1 .  traZODone (DESYREL) 50 MG tablet, TAKE 1/2 TABLET BY MOUTH AT BEDTIME IF NEEDED FOR SLEEP, Disp: 45 tablet, Rfl: 3  Physical exam:  Vitals:   04/23/17 1403  BP: 138/65  Pulse: 60  Resp: 16  Temp: 98 F (36.7 C)  TempSrc: Tympanic  Weight: 228 lb (103.4 kg)   Physical Exam  Constitutional: She is oriented to person, place, and time and well-developed, well-nourished, and in no distress. No distress.  HENT:  Head: Normocephalic and atraumatic.  Eyes: Conjunctivae and EOM are normal. Pupils are equal, round, and reactive to light.  Neck: Normal range of  motion. Neck supple.  Cardiovascular: Normal rate and regular rhythm.  Murmur heard. Pulmonary/Chest: Effort normal and breath sounds normal.  Abdominal: Soft. Bowel sounds are normal.  She exhibits no distension.  Musculoskeletal: Normal range of motion. She exhibits no deformity.  Neurological: She is alert and oriented to person, place, and time. She displays normal reflexes. No cranial nerve deficit.  Skin: Skin is warm and dry. No erythema.  Psychiatric: Affect normal.    CMP Latest Ref Rng & Units 04/14/2017  Glucose 65 - 99 mg/dL 143(H)  BUN 6 - 20 mg/dL 13  Creatinine 0.44 - 1.00 mg/dL 0.85  Sodium 135 - 145 mmol/L 139  Potassium 3.5 - 5.1 mmol/L 4.2  Chloride 101 - 111 mmol/L 101  CO2 22 - 32 mmol/L 28  Calcium 8.9 - 10.3 mg/dL 8.9  Total Protein 6.5 - 8.1 g/dL 7.0  Total Bilirubin 0.3 - 1.2 mg/dL 0.5  Alkaline Phos 38 - 126 U/L 62  AST 15 - 41 U/L 22  ALT 14 - 54 U/L 9(L)   CBC Latest Ref Rng & Units 04/14/2017  WBC 3.6 - 11.0 K/uL 6.7  Hemoglobin 12.0 - 16.0 g/dL 10.0(L)  Hematocrit 35.0 - 47.0 % 30.8(L)  Platelets 150 - 440 K/uL 228   Foundation one test, PD-L1 pending HER 2 negative.   Assessment and plan  Cancer Staging Malignant neoplasm of lower third of esophagus [facility]) Staging form: Esophagus - Adenocarcinoma, AJCC 8th Edition - Clinical stage from 04/10/2017: Stage IVB (cTX, cNX, pM1) - Signed by Earlie Server, MD on 04/10/2017  1. Malignant neoplasm of lower third of esophagus (HCC)   2. Iron deficiency anemia due to chronic blood loss    # Iron deficiency anemia: Status post 2 doses, to complete a total of 4 Venofer 261m weekly. Hb slightly improved. One dose of IV venofer today. # Follow up with Dr.Chrystal for palliative RT to resolving dysphagia. # Pre-existing peripheral neuropathy: continue Lyrica, Oxaliplatin dose were reduced to 616mm2. Patient reports neuropathy at baseline Continue monitor.   Follow up in 1 week prior to cycle 2 of FOLFOX    ZhEarlie ServerMD, PhD Hematology Oncology [facility] [facility]- [phone]

## 2017-04-23 ENCOUNTER — Inpatient Hospital Stay: Payer: Medicare Other

## 2017-04-23 ENCOUNTER — Ambulatory Visit
Admission: RE | Admit: 2017-04-23 | Discharge: 2017-04-23 | Disposition: A | Discharge status: Home / Self Care | Payer: Medicare Other | Source: Ambulatory Visit | Attending: Radiation Oncology | Admitting: Radiation Oncology

## 2017-04-23 ENCOUNTER — Encounter: Payer: Self-pay | Admitting: Oncology

## 2017-04-23 ENCOUNTER — Inpatient Hospital Stay: Payer: Medicare Other | Admitting: Oncology

## 2017-04-23 VITALS — BP 138/65 | HR 60 | Temp 98.0°F | Resp 16 | Wt 228.0 lb

## 2017-04-23 DIAGNOSIS — R1013 Epigastric pain: Secondary | ICD-10-CM

## 2017-04-23 DIAGNOSIS — D500 Iron deficiency anemia secondary to blood loss (chronic): Secondary | ICD-10-CM | POA: Diagnosis not present

## 2017-04-23 DIAGNOSIS — K5900 Constipation, unspecified: Secondary | ICD-10-CM

## 2017-04-23 DIAGNOSIS — Z85820 Personal history of malignant melanoma of skin: Secondary | ICD-10-CM

## 2017-04-23 DIAGNOSIS — K769 Liver disease, unspecified: Secondary | ICD-10-CM

## 2017-04-23 DIAGNOSIS — M19012 Primary osteoarthritis, left shoulder: Secondary | ICD-10-CM | POA: Diagnosis not present

## 2017-04-23 DIAGNOSIS — R634 Abnormal weight loss: Secondary | ICD-10-CM

## 2017-04-23 DIAGNOSIS — C155 Malignant neoplasm of lower third of esophagus: Secondary | ICD-10-CM

## 2017-04-23 DIAGNOSIS — R5381 Other malaise: Secondary | ICD-10-CM

## 2017-04-23 DIAGNOSIS — Z9989 Dependence on other enabling machines and devices: Secondary | ICD-10-CM

## 2017-04-23 DIAGNOSIS — R1310 Dysphagia, unspecified: Secondary | ICD-10-CM

## 2017-04-23 DIAGNOSIS — Z87891 Personal history of nicotine dependence: Secondary | ICD-10-CM

## 2017-04-23 DIAGNOSIS — I10 Essential (primary) hypertension: Secondary | ICD-10-CM

## 2017-04-23 DIAGNOSIS — Z806 Family history of leukemia: Secondary | ICD-10-CM

## 2017-04-23 DIAGNOSIS — R5383 Other fatigue: Secondary | ICD-10-CM | POA: Diagnosis not present

## 2017-04-23 DIAGNOSIS — Z87442 Personal history of urinary calculi: Secondary | ICD-10-CM

## 2017-04-23 DIAGNOSIS — Z803 Family history of malignant neoplasm of breast: Secondary | ICD-10-CM

## 2017-04-23 DIAGNOSIS — Z800 Family history of malignant neoplasm of digestive organs: Secondary | ICD-10-CM

## 2017-04-23 DIAGNOSIS — Z808 Family history of malignant neoplasm of other organs or systems: Secondary | ICD-10-CM

## 2017-04-23 DIAGNOSIS — J449 Chronic obstructive pulmonary disease, unspecified: Secondary | ICD-10-CM

## 2017-04-23 DIAGNOSIS — Z79899 Other long term (current) drug therapy: Secondary | ICD-10-CM

## 2017-04-23 DIAGNOSIS — R110 Nausea: Secondary | ICD-10-CM | POA: Diagnosis not present

## 2017-04-23 DIAGNOSIS — G4733 Obstructive sleep apnea (adult) (pediatric): Secondary | ICD-10-CM

## 2017-04-23 DIAGNOSIS — G629 Polyneuropathy, unspecified: Secondary | ICD-10-CM

## 2017-04-23 DIAGNOSIS — Z8052 Family history of malignant neoplasm of bladder: Secondary | ICD-10-CM

## 2017-04-23 DIAGNOSIS — Z801 Family history of malignant neoplasm of trachea, bronchus and lung: Secondary | ICD-10-CM

## 2017-04-23 LAB — CBC WITH DIFFERENTIAL/PLATELET
Basophils Absolute: 0 10*3/uL (ref 0–0.1)
Basophils Relative: 1 %
Eosinophils Absolute: 0.3 10*3/uL (ref 0–0.7)
Eosinophils Relative: 4 %
HEMATOCRIT: 33.3 % — AB (ref 35.0–47.0)
HEMOGLOBIN: 10.7 g/dL — AB (ref 12.0–16.0)
LYMPHS ABS: 1.3 10*3/uL (ref 1.0–3.6)
LYMPHS PCT: 17 %
MCH: 29.6 pg (ref 26.0–34.0)
MCHC: 32.2 g/dL (ref 32.0–36.0)
MCV: 92.1 fL (ref 80.0–100.0)
MONOS PCT: 7 %
Monocytes Absolute: 0.5 10*3/uL (ref 0.2–0.9)
NEUTROS ABS: 5.3 10*3/uL (ref 1.4–6.5)
NEUTROS PCT: 71 %
Platelets: 224 10*3/uL (ref 150–440)
RBC: 3.62 MIL/uL — AB (ref 3.80–5.20)
RDW: 14.1 % (ref 11.5–14.5)
WBC: 7.3 10*3/uL (ref 3.6–11.0)

## 2017-04-23 LAB — COMPREHENSIVE METABOLIC PANEL
ALK PHOS: 66 U/L (ref 38–126)
ALT: 20 U/L (ref 14–54)
ANION GAP: 9 (ref 5–15)
AST: 27 U/L (ref 15–41)
Albumin: 3.5 g/dL (ref 3.5–5.0)
BILIRUBIN TOTAL: 0.4 mg/dL (ref 0.3–1.2)
BUN: 13 mg/dL (ref 6–20)
CALCIUM: 8.7 mg/dL — AB (ref 8.9–10.3)
CO2: 26 mmol/L (ref 22–32)
Chloride: 105 mmol/L (ref 101–111)
Creatinine, Ser: 0.67 mg/dL (ref 0.44–1.00)
Glucose, Bld: 112 mg/dL — ABNORMAL HIGH (ref 65–99)
Potassium: 3.7 mmol/L (ref 3.5–5.1)
Sodium: 140 mmol/L (ref 135–145)
TOTAL PROTEIN: 6.6 g/dL (ref 6.5–8.1)

## 2017-04-23 MED ORDER — SODIUM CHLORIDE 0.9% FLUSH
10.0000 mL | INTRAVENOUS | Status: DC | PRN
  Administered 2017-04-23: 10 mL
  Filled 2017-04-23: qty 10

## 2017-04-23 MED ORDER — SODIUM CHLORIDE 0.9 % IV SOLN
Freq: Once | INTRAVENOUS | Status: AC
  Administered 2017-04-23: 15:00:00 via INTRAVENOUS
  Filled 2017-04-23: qty 1000

## 2017-04-23 MED ORDER — IRON SUCROSE 20 MG/ML IV SOLN
200.0000 mg | Freq: Once | INTRAVENOUS | Status: AC
  Administered 2017-04-23: 200 mg via INTRAVENOUS
  Filled 2017-04-23: qty 10

## 2017-04-23 MED ORDER — HEPARIN SOD (PORK) LOCK FLUSH 100 UNIT/ML IV SOLN
500.0000 [IU] | Freq: Once | INTRAVENOUS | Status: AC | PRN
  Administered 2017-04-23: 500 [IU]
  Filled 2017-04-23: qty 5

## 2017-04-23 MED ORDER — DICLOFENAC SODIUM 1 % TD CREA: TOPICAL_CREAM | TRANSDERMAL | 0 refills | Status: AC

## 2017-04-23 NOTE — Progress Notes (Signed)
Patient here for follow up today with labs and IV Venofer. She had her first Folfox treatment on December 3rd. She states that she felt really well until the day after her pump was removed, when she experienced nausea and aching joints. She reports that she has been taking senna every day since she started chemo, but only had one large bowel movement on Friday after her chemo and hasn's had one since.

## 2017-04-24 ENCOUNTER — Ambulatory Visit
Admission: RE | Admit: 2017-04-24 | Discharge: 2017-04-24 | Disposition: A | Discharge status: Home / Self Care | Payer: Medicare Other | Source: Ambulatory Visit | Attending: Radiation Oncology | Admitting: Radiation Oncology

## 2017-04-24 ENCOUNTER — Ambulatory Visit: Payer: Medicare Other

## 2017-04-24 ENCOUNTER — Encounter: Payer: Self-pay | Admitting: Oncology

## 2017-04-24 DIAGNOSIS — C155 Malignant neoplasm of lower third of esophagus: Secondary | ICD-10-CM | POA: Diagnosis not present

## 2017-04-24 NOTE — Addendum Note (Signed)
Addended by: Earlie Server on: 04/24/2017 09:19 AM   Modules accepted: Level of Service

## 2017-04-25 ENCOUNTER — Ambulatory Visit: Payer: Medicare Other

## 2017-04-25 ENCOUNTER — Ambulatory Visit
Admission: RE | Admit: 2017-04-25 | Discharge: 2017-04-25 | Disposition: A | Discharge status: Home / Self Care | Payer: Medicare Other | Source: Ambulatory Visit | Attending: Radiation Oncology | Admitting: Radiation Oncology

## 2017-04-25 DIAGNOSIS — C155 Malignant neoplasm of lower third of esophagus: Secondary | ICD-10-CM | POA: Diagnosis not present

## 2017-04-25 LAB — SURGICAL PATHOLOGY

## 2017-04-27 NOTE — Progress Notes (Signed)
Hematology/Oncology Follow Up Note [facility]  Telephone:([phone] Fax:([phone]  Patient Care Team: Tonia Ghent, MD as PCP - General (Family Medicine)   Name of the patient: [name]  [id number]  1934/09/08   REASON FOR VISIT Follow up of management of esophageal adenocarcinoma in the management of iron deficiency anemia.  HISTORY OF PRESENT ILLNESS Patient is a 82 year old female with past medical history listed as below who presented with complaints of epigastric discomfort, and sensation of food being stuck. Patient also reports that epigastric pain is aching in nature, and intermittent, for about several months. Associated with weight loss about 20 pounds in the past year. Patient also felt shortness of breath and in the emergency room oxygen saturation will at 83%. CT abdomen showed multiple masses in the liver, unknown etiology. EGD during admission showed partially obstructive esophageal mass at the distal part of esophagus. Biopsy was taken.  Pathology showed esophageal adenocarcinoma. US biopsy of liver lesion to proof distant metastasis.  Report family history of breast cancer, but she has been having routine mammograms. Lives alone. She's a former smoker.  INTERVAL HISTORY Patient presents for evaluation after cycle 2 FOLFOX chemotherapy for esophageal adenocarcinoma. She feels energy is better. She takes Senokot daily and MiraLAX when necessary. Constipation has slightly improved. Denies any nausea vomiting. Current Pain Regimen:  Tramadol to 11m Q6h PRN. She uses half tablet sometimes.    Review of systems Review of Systems  Constitutional: Negative for chills, fever, malaise/fatigue and weight loss.  HENT: Negative for ear pain, hearing loss and tinnitus.   Eyes: Negative for blurred vision, double vision and redness.  Respiratory: Negative for cough and wheezing.   Cardiovascular: Negative for chest pain, palpitations  and PND.  Gastrointestinal: Negative for melena, nausea and vomiting.       Dysphagia   Genitourinary: Negative for dysuria, flank pain and urgency.  Musculoskeletal: Negative for falls, myalgias and neck pain.  Skin: Negative for rash.  Neurological: Negative for dizziness and headaches.  Endo/Heme/Allergies: Negative for environmental allergies. Does not bruise/bleed easily.  Psychiatric/Behavioral: Negative for depression, hallucinations, substance abuse and suicidal ideas.    Allergies  Allergen Reactions  . Cefuroxime Nausea Only  . Codeine Nausea Only  . Doxycycline Other (See Comments)    Lip swelling  . Gabapentin Other (See Comments)    Swelling.   . Iohexol Itching    07-15-13 pt developed itching on fingers after contrast given. Dr. OIrish Elderslooked at pt and said to put in system as allergy. BB  . Other Other (See Comments)    Contrast Dye- caused fever, chills, awful feeling Bandages - itching, rash  . Oxycodone Other (See Comments)    nausea  . Quinolones Swelling    Lip swelling  . Synvisc [Hylan G-F 20] Swelling     Past Medical History:  Diagnosis Date  . Arthritis   . Asthma   . COPD [place]   . Dysphonia   . Dyspnea   . [place]   . Heart murmur   . History of kidney stones   . Hypertension   . [place]an[place]   . OAB (overactive bladder)   . OSA on CPAP   . 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 Pupil asymmetry    R pupil defect  . Skin cancer   . Sleep apnea   . Spinal cord abscess  Past Surgical History:  Procedure Laterality Date  . ABDOMINAL HYSTERECTOMY    . ANTERIOR CERVICAL DECOMP/DISCECTOMY FUSION N/A 07/16/2013   Procedure: ANTERIOR CERVICAL DECOMPRESSION/DISCECTOMY FUSION mutlipleLEVELS C4-7;  Surgeon: Erline Levine, MD;  Location: [place] NEURO ORS;  Service: Neurosurgery;  Laterality: N/A;  . APPENDECTOMY    . carpel tunn Bilateral   . CATARACT EXTRACTION    .  CATARACT EXTRACTION, BILATERAL    . CHOLECYSTECTOMY    . dental implant    . ESOPHAGOGASTRODUODENOSCOPY Left 03/21/2017   Procedure: ESOPHAGOGASTRODUODENOSCOPY (EGD);  Surgeon: Virgel Manifold, MD;  Location: [facility] ENDOSCOPY;  Service: Endoscopy;  Laterality: Left;  Marland Kitchen GASTRIC BYPASS    . HIATAL HERNIA REPAIR    . MELANOMA EXCISION    . PORTA CATH INSERTION N/A 04/07/2017   Procedure: PORTA CATH INSERTION;  Surgeon: Algernon Huxley, MD;  Location: [place] CV LAB;  Service: Cardiovascular;  Laterality: N/A;  . POSTERIOR CERVICAL FUSION/FORAMINOTOMY N/A 07/28/2013   Procedure: Cervical four-seven  posterior cervical fusion;  Surgeon: Erline Levine, MD;  Location: [place] NEURO ORS;  Service: Neurosurgery;  Laterality: N/A;  . TONSILLECTOMY    . TOTAL KNEE ARTHROPLASTY      Social History   Socioeconomic History  . Marital status: Widowed    Spouse name: Not on file  . Number of children: 2  . Years of education: Not on file  . Highest education level: Master's degree (e.g., MA, MS, MEng, MEd, MSW, MBA)  Social Needs  . Financial resource strain: Not hard at all  . Food insecurity - worry: Never true  . Food insecurity - inability: Never true  . Transportation needs - medical: No  . Transportation needs - non-medical: No  Occupational History  . Not on file  Tobacco Use  . Smoking status: Former Smoker    Packs/day: 1.00    Years: 20.00    Pack years: 20.00    Types: Cigarettes    Last attempt to quit: 1977    Years since quitting: 41.9  . Smokeless tobacco: Never Used  Substance and Sexual Activity  . Alcohol use: No  . Drug use: No  . Sexual activity: No  Other Topics Concern  . Not on file  Social History Narrative   Marital Status- Widowed    Lives by herself    Employement- Retired Pharmacist, hospital   Exercise hx- Does PT     Family History  Problem Relation Age of Onset  . Breast cancer Mother 42  . Arthritis-Osteo Mother   . Breast cancer Sister 64  . Bladder Cancer  Sister   . Bladder Cancer Father   . Tongue cancer Father   . Congenital heart disease Father   . Prostate cancer Brother   . Uterine cancer Maternal Aunt   . Lung cancer Paternal Uncle   . Leukemia Maternal Grandmother   . Liver cancer Paternal Grandmother   . Colon cancer Neg Hx      Current Outpatient Medications:  .  acetaminophen (TYLENOL) 325 MG tablet, Take 650 mg by mouth every 6 (six) hours as needed for moderate pain or headache. , Disp: , Rfl:  .  calcium carbonate (TUMS EX) 750 MG chewable tablet, Chew 1 tablet by mouth daily., Disp: , Rfl:  .  Cholecalciferol (VITAMIN D) 2000 UNITS CAPS, Take 2,000 Units by mouth daily., Disp: , Rfl:  .  cyanocobalamin 500 MCG tablet, Take 500 mcg every other day by mouth., Disp: , Rfl:  .  dexamethasone (DECADRON)  4 MG tablet, Take 2 tablets (8 mg total) by mouth daily. Start the day after chemotherapy for 2 days. Take with food., Disp: 30 tablet, Rfl: 1 .  Diclofenac Sodium 1 % CREA, Apply to left arm as needed for pain three times daily, Disp: 120 g, Rfl: 0 .  fluticasone (FLONASE) 50 MCG/ACT nasal spray, Place 1-2 sprays into both nostrils daily. , Disp: , Rfl:  .  hydrocortisone cream 1 %, Apply 1 application topically daily as needed for itching. , Disp: , Rfl:  .  lidocaine-prilocaine (EMLA) cream, Apply to affected area once, Disp: 30 g, Rfl: 3 .  loperamide (IMODIUM) 2 MG capsule, Take 1 capsule (2 mg total) by mouth See admin instructions. With onset of loose stool, take 48m followed by 250mevery 2 hours until 12 hours have passed without loose bowel movement. Maximum: 16 mg/day, Disp: 120 capsule, Rfl: 0 .  loratadine (CLARITIN) 10 MG tablet, Take 10 mg by mouth daily., Disp: , Rfl:  .  LYRICA 150 MG capsule, TAKE 1 CAPSULE BY MOUTH TWO TIMES DAILY, Disp: 180 capsule, Rfl: 1 .  metoprolol succinate (TOPROL-XL) 50 MG 24 hr tablet, TAKE 1 TABLET BY MOUTH DAILY, Disp: 90 tablet, Rfl: 3 .  ondansetron (ZOFRAN) 8 MG tablet, Take 1 tablet  (8 mg total) by mouth 2 (two) times daily as needed for refractory nausea / vomiting. Start on day 3 after chemotherapy., Disp: 30 tablet, Rfl: 1 .  oxybutynin (DITROPAN) 5 MG tablet, TAKE 1/2 TABLET BY MOUTH IN THE MORNING AND 1 TABLET AT BEDTIME, Disp: , Rfl:  .  Polyethyl Glycol-Propyl Glycol (SYSTANE OP), Apply 1 drop to eye daily as needed (dry eyes)., Disp: , Rfl:  .  prochlorperazine (COMPAZINE) 10 MG tablet, Take 1 tablet (10 mg total) by mouth every 6 (six) hours as needed (Nausea or vomiting)., Disp: 30 tablet, Rfl: 1 .  senna-docusate (SENOKOT-S) 8.6-50 MG tablet, Take 1 tablet at bedtime as needed by mouth for mild constipation. (Patient taking differently: Take 1 tablet by mouth at bedtime as needed for mild constipation. ), Disp: , Rfl:  .  traMADol (ULTRAM) 50 MG tablet, Take 1 tablet (50 mg total) by mouth 3 (three) times daily as needed for moderate pain., Disp: 30 tablet, Rfl: 1 .  traZODone (DESYREL) 50 MG tablet, TAKE 1/2 TABLET BY MOUTH AT BEDTIME IF NEEDED FOR SLEEP, Disp: 45 tablet, Rfl: 3  Physical exam:  There were no vitals filed for this visit. Physical Exam  Constitutional: She is oriented to person, place, and time and well-developed, well-nourished, and in no distress. No distress.  HENT:  Head: Normocephalic and atraumatic.  Eyes: Conjunctivae and EOM are normal. Pupils are equal, round, and reactive to light.  Neck: Normal range of motion. Neck supple.  Cardiovascular: Normal rate and regular rhythm.  Murmur heard. Pulmonary/Chest: Effort normal and breath sounds normal.  Abdominal: Soft. Bowel sounds are normal. She exhibits no distension.  Musculoskeletal: Normal range of motion. She exhibits no deformity.  Neurological: She is alert and oriented to person, place, and time. She displays normal reflexes. No cranial nerve deficit.  Skin: Skin is warm and dry. No erythema.  Psychiatric: Affect normal.    CMP Latest Ref Rng & Units 04/23/2017  Glucose 65 - 99  mg/dL 112(H)  BUN 6 - 20 mg/dL 13  Creatinine 0.44 - 1.00 mg/dL 0.67  Sodium 135 - 145 mmol/L 140  Potassium 3.5 - 5.1 mmol/L 3.7  Chloride 101 - 111 mmol/L  105  CO2 22 - 32 mmol/L 26  Calcium 8.9 - 10.3 mg/dL 8.7(L)  Total Protein 6.5 - 8.1 g/dL 6.6  Total Bilirubin 0.3 - 1.2 mg/dL 0.4  Alkaline Phos 38 - 126 U/L 66  AST 15 - 41 U/L 27  ALT 14 - 54 U/L 20   CBC Latest Ref Rng & Units 04/23/2017  WBC 3.6 - 11.0 K/uL 7.3  Hemoglobin 12.0 - 16.0 g/dL 10.7(L)  Hematocrit 35.0 - 47.0 % 33.3(L)  Platelets 150 - 440 K/uL 224   Foundation one test, PD-L1 pending HER 2 negative.   Assessment and plan  Cancer Staging Malignant neoplasm of lower third of esophagus [facility]) Staging form: Esophagus - Adenocarcinoma, AJCC 8th Edition - Clinical stage from 04/10/2017: Stage IVB (cTX, cNX, pM1) - Signed by Earlie Server, MD on 04/10/2017  1. Encounter for antineoplastic chemotherapy   2. Iron deficiency anemia due to chronic blood loss   3. Malignant neoplasm of lower third of esophagus (HCC)   4. Aortic valve insufficiency, etiology of cardiac valve disease unspecified    # Iron deficiency anemia: will give 4th dose of Venofer today.  # Proceed with cycle 2 FOLFOX She has been started Radiation. Continue follow up with RadOnc for RT. I send her Rx of Omeprazole 37m daily if she start to feel acid reflux symptoms.  # Pre-existing peripheral neuropathy: continue Lyrica, Oxaliplatin dose were reduced to 659mm2. Patient reports neuropathy at baseline Continue monitor. Rx of B6 supplements sent to Pharmacy.   Follow up in 2 week prior to cycle 3 of FOLFOX. Check iron TIBC and ferritin.  ZhEarlie ServerMD, PhD Hematology Oncology CoNationwide Children'S Hospitalt [facility]- [phone]

## 2017-04-28 ENCOUNTER — Inpatient Hospital Stay: Payer: Medicare Other

## 2017-04-28 ENCOUNTER — Other Ambulatory Visit: Payer: Self-pay

## 2017-04-28 ENCOUNTER — Encounter: Payer: Self-pay | Admitting: Oncology

## 2017-04-28 ENCOUNTER — Ambulatory Visit: Payer: Medicare Other

## 2017-04-28 ENCOUNTER — Ambulatory Visit
Admission: RE | Admit: 2017-04-28 | Discharge: 2017-04-28 | Disposition: A | Discharge status: Home / Self Care | Payer: Medicare Other | Source: Ambulatory Visit | Attending: Radiation Oncology | Admitting: Radiation Oncology

## 2017-04-28 ENCOUNTER — Inpatient Hospital Stay (HOSPITAL_BASED_OUTPATIENT_CLINIC_OR_DEPARTMENT_OTHER): Payer: Medicare Other | Admitting: Oncology

## 2017-04-28 VITALS — BP 138/76 | HR 57 | Temp 98.0°F | Wt 229.6 lb

## 2017-04-28 DIAGNOSIS — J449 Chronic obstructive pulmonary disease, unspecified: Secondary | ICD-10-CM | POA: Diagnosis not present

## 2017-04-28 DIAGNOSIS — C155 Malignant neoplasm of lower third of esophagus: Secondary | ICD-10-CM

## 2017-04-28 DIAGNOSIS — I10 Essential (primary) hypertension: Secondary | ICD-10-CM

## 2017-04-28 DIAGNOSIS — R634 Abnormal weight loss: Secondary | ICD-10-CM

## 2017-04-28 DIAGNOSIS — Z5111 Encounter for antineoplastic chemotherapy: Secondary | ICD-10-CM

## 2017-04-28 DIAGNOSIS — K769 Liver disease, unspecified: Secondary | ICD-10-CM | POA: Diagnosis not present

## 2017-04-28 DIAGNOSIS — Z808 Family history of malignant neoplasm of other organs or systems: Secondary | ICD-10-CM

## 2017-04-28 DIAGNOSIS — Z8052 Family history of malignant neoplasm of bladder: Secondary | ICD-10-CM

## 2017-04-28 DIAGNOSIS — M19012 Primary osteoarthritis, left shoulder: Secondary | ICD-10-CM | POA: Diagnosis not present

## 2017-04-28 DIAGNOSIS — G629 Polyneuropathy, unspecified: Secondary | ICD-10-CM | POA: Diagnosis not present

## 2017-04-28 DIAGNOSIS — Z806 Family history of leukemia: Secondary | ICD-10-CM

## 2017-04-28 DIAGNOSIS — D500 Iron deficiency anemia secondary to blood loss (chronic): Secondary | ICD-10-CM | POA: Diagnosis not present

## 2017-04-28 DIAGNOSIS — R1013 Epigastric pain: Secondary | ICD-10-CM | POA: Diagnosis not present

## 2017-04-28 DIAGNOSIS — Z803 Family history of malignant neoplasm of breast: Secondary | ICD-10-CM

## 2017-04-28 DIAGNOSIS — Z800 Family history of malignant neoplasm of digestive organs: Secondary | ICD-10-CM

## 2017-04-28 DIAGNOSIS — Z87891 Personal history of nicotine dependence: Secondary | ICD-10-CM

## 2017-04-28 DIAGNOSIS — Z79899 Other long term (current) drug therapy: Secondary | ICD-10-CM

## 2017-04-28 DIAGNOSIS — Z801 Family history of malignant neoplasm of trachea, bronchus and lung: Secondary | ICD-10-CM

## 2017-04-28 DIAGNOSIS — Z87442 Personal history of urinary calculi: Secondary | ICD-10-CM

## 2017-04-28 DIAGNOSIS — Z85820 Personal history of malignant melanoma of skin: Secondary | ICD-10-CM

## 2017-04-28 DIAGNOSIS — I351 Nonrheumatic aortic (valve) insufficiency: Secondary | ICD-10-CM | POA: Diagnosis not present

## 2017-04-28 DIAGNOSIS — G4733 Obstructive sleep apnea (adult) (pediatric): Secondary | ICD-10-CM

## 2017-04-28 DIAGNOSIS — Z9989 Dependence on other enabling machines and devices: Secondary | ICD-10-CM

## 2017-04-28 DIAGNOSIS — K5900 Constipation, unspecified: Secondary | ICD-10-CM

## 2017-04-28 DIAGNOSIS — R1310 Dysphagia, unspecified: Secondary | ICD-10-CM | POA: Diagnosis not present

## 2017-04-28 LAB — CBC WITH DIFFERENTIAL/PLATELET
BASOS ABS: 0 10*3/uL (ref 0–0.1)
BASOS PCT: 1 %
EOS ABS: 0.2 10*3/uL (ref 0–0.7)
Eosinophils Relative: 4 %
HEMATOCRIT: 33.3 % — AB (ref 35.0–47.0)
HEMOGLOBIN: 10.7 g/dL — AB (ref 12.0–16.0)
Lymphocytes Relative: 19 %
Lymphs Abs: 0.9 10*3/uL — ABNORMAL LOW (ref 1.0–3.6)
MCH: 29.8 pg (ref 26.0–34.0)
MCHC: 32.1 g/dL (ref 32.0–36.0)
MCV: 92.9 fL (ref 80.0–100.0)
MONOS PCT: 10 %
Monocytes Absolute: 0.5 10*3/uL (ref 0.2–0.9)
NEUTROS ABS: 3.3 10*3/uL (ref 1.4–6.5)
NEUTROS PCT: 66 %
Platelets: 186 10*3/uL (ref 150–440)
RBC: 3.59 MIL/uL — ABNORMAL LOW (ref 3.80–5.20)
RDW: 14.9 % — AB (ref 11.5–14.5)
WBC: 4.9 10*3/uL (ref 3.6–11.0)

## 2017-04-28 LAB — COMPREHENSIVE METABOLIC PANEL
ALK PHOS: 60 U/L (ref 38–126)
ALT: 15 U/L (ref 14–54)
AST: 24 U/L (ref 15–41)
Albumin: 3.6 g/dL (ref 3.5–5.0)
Anion gap: 6 (ref 5–15)
BUN: 16 mg/dL (ref 6–20)
CALCIUM: 8.9 mg/dL (ref 8.9–10.3)
CHLORIDE: 105 mmol/L (ref 101–111)
CO2: 31 mmol/L (ref 22–32)
CREATININE: 0.65 mg/dL (ref 0.44–1.00)
GFR calc Af Amer: >60 mL/min (ref >60)
Glucose, Bld: 88 mg/dL (ref 65–99)
Potassium: 4.5 mmol/L (ref 3.5–5.1)
Sodium: 142 mmol/L (ref 135–145)
Total Bilirubin: 0.3 mg/dL (ref 0.3–1.2)
Total Protein: 6.7 g/dL (ref 6.5–8.1)

## 2017-04-28 MED ORDER — DEXTROSE 5 % IV SOLN
Freq: Once | INTRAVENOUS | Status: AC
  Administered 2017-04-28: 12:00:00 via INTRAVENOUS
  Filled 2017-04-28: qty 1000

## 2017-04-28 MED ORDER — IRON SUCROSE 20 MG/ML IV SOLN
200.0000 mg | Freq: Once | INTRAVENOUS | Status: AC
Start: 2017-04-28 — End: 2017-04-28
  Administered 2017-04-28: 200 mg via INTRAVENOUS
  Filled 2017-04-28: qty 10

## 2017-04-28 MED ORDER — FLUOROURACIL CHEMO INJECTION 5 GM/100ML
2400.0000 mg/m2 | INTRAVENOUS | Status: DC
  Administered 2017-04-28: 5200 mg via INTRAVENOUS
  Filled 2017-04-28: qty 104

## 2017-04-28 MED ORDER — FLUOROURACIL CHEMO INJECTION 2.5 GM/50ML
400.0000 mg/m2 | Freq: Once | INTRAVENOUS | Status: AC
  Administered 2017-04-28: 850 mg via INTRAVENOUS
  Filled 2017-04-28: qty 17

## 2017-04-28 MED ORDER — DEXAMETHASONE SODIUM PHOSPHATE 10 MG/ML IJ SOLN
10.0000 mg | Freq: Once | INTRAMUSCULAR | Status: AC
  Administered 2017-04-28: 10 mg via INTRAVENOUS

## 2017-04-28 MED ORDER — PALONOSETRON HCL INJECTION 0.25 MG/5ML
INTRAVENOUS | Status: AC
  Filled 2017-04-28: qty 5

## 2017-04-28 MED ORDER — SODIUM CHLORIDE 0.9 % IV SOLN: 10.0000 mg | Freq: Once | INTRAVENOUS | Status: DC

## 2017-04-28 MED ORDER — OMEPRAZOLE 40 MG PO CPDR: 40.0000 mg | DELAYED_RELEASE_CAPSULE | Freq: Every day | ORAL | 0 refills | Status: DC

## 2017-04-28 MED ORDER — DEXAMETHASONE SODIUM PHOSPHATE 10 MG/ML IJ SOLN
INTRAMUSCULAR | Status: AC
  Filled 2017-04-28: qty 1

## 2017-04-28 MED ORDER — SODIUM CHLORIDE 0.9 % IV SOLN
Freq: Once | INTRAVENOUS | Status: AC
  Administered 2017-04-28: 11:00:00 via INTRAVENOUS
  Filled 2017-04-28: qty 1000

## 2017-04-28 MED ORDER — PALONOSETRON HCL INJECTION 0.25 MG/5ML
0.2500 mg | Freq: Once | INTRAVENOUS | Status: AC
  Administered 2017-04-28: 0.25 mg via INTRAVENOUS

## 2017-04-28 MED ORDER — VITAMIN B-6 100 MG PO TABS: 100.0000 mg | ORAL_TABLET | Freq: Every day | ORAL | 3 refills | Status: AC

## 2017-04-28 MED ORDER — DEXTROSE 5 % IV SOLN
850.0000 mg | Freq: Once | INTRAVENOUS | Status: AC
  Administered 2017-04-28: 850 mg via INTRAVENOUS
  Filled 2017-04-28: qty 25

## 2017-04-28 MED ORDER — OXALIPLATIN CHEMO INJECTION 100 MG/20ML
65.0000 mg/m2 | Freq: Once | INTRAVENOUS | Status: AC
  Administered 2017-04-28: 140 mg via INTRAVENOUS
  Filled 2017-04-28: qty 18.4

## 2017-04-28 NOTE — Progress Notes (Signed)
Per Dr. Tasia Catchings, patient to receive Folfox and Venofer today.

## 2017-04-28 NOTE — Progress Notes (Signed)
Patient here today for follow up.   

## 2017-04-28 NOTE — Progress Notes (Signed)
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  Patient receiving chemotherapy and radiation therapy.    Met with patient and sister during infusion this pm.  Patient reports that she is eating better and more protein.  "I have been trying to eat 75-100 gm of protein everyday.  Patient is retired [employer] features editor.  Reports that she has been drinking pronorish at least daily and drinking 1-2 other protein drinks that she mixes with water (protein powder).  Reports that she is still eating cereal and milk for breakfast, does not really like eggs recently, soups.  Ate chicken burrito with rice and beans last night for dinner.  Has had some issues with nausea but has not been taking nausea medication correctly.  Will take correctly this time.  Also has been having some issues with constipation and has added miralax with success.     Medications: omeprazole added, zofran, compazine, senokot and miralax  Labs: reviewed  Anthropometrics:   Weight is decreased to 229 lb 9 oz from 232 lb on 12/3.  UBW of 257 lb (last year)   NUTRITION DIAGNOSIS: Inadequate oral intake improving   MALNUTRITION DIAGNOSIS: continue to monitor   INTERVENTION:   Discussed clear liquid oral nutrition supplements and samples provided today.  Encouraged pronorish supplement and clear liquid shakes as patient does not feel she will tolerate ensure/boost drinks Encouraged patient to continue to eat high calorie, high protein foods to maintain good nutrition during treatment.     MONITORING, EVALUATION, GOAL: weight trends, intake   NEXT VISIT: to be scheduled with treatment  Kaydan Wong B. Zenia Resides, [place], [place] Registered Dietitian [phone] (pager)

## 2017-04-29 ENCOUNTER — Encounter: Payer: Self-pay | Admitting: Oncology

## 2017-04-29 ENCOUNTER — Inpatient Hospital Stay: Payer: Medicare Other

## 2017-04-29 ENCOUNTER — Ambulatory Visit: Payer: Medicare Other

## 2017-04-29 ENCOUNTER — Ambulatory Visit
Admission: RE | Admit: 2017-04-29 | Discharge: 2017-04-29 | Disposition: A | Discharge status: Home / Self Care | Payer: Medicare Other | Source: Ambulatory Visit | Attending: Radiation Oncology | Admitting: Radiation Oncology

## 2017-04-29 DIAGNOSIS — C155 Malignant neoplasm of lower third of esophagus: Secondary | ICD-10-CM | POA: Diagnosis not present

## 2017-04-30 ENCOUNTER — Inpatient Hospital Stay: Payer: Medicare Other

## 2017-04-30 ENCOUNTER — Ambulatory Visit: Payer: Medicare Other

## 2017-04-30 ENCOUNTER — Ambulatory Visit
Admission: RE | Admit: 2017-04-30 | Discharge: 2017-04-30 | Disposition: A | Discharge status: Home / Self Care | Payer: Medicare Other | Source: Ambulatory Visit | Attending: Radiation Oncology | Admitting: Radiation Oncology

## 2017-04-30 VITALS — BP 145/68 | HR 58 | Temp 98.0°F | Resp 18

## 2017-04-30 DIAGNOSIS — C155 Malignant neoplasm of lower third of esophagus: Secondary | ICD-10-CM | POA: Diagnosis not present

## 2017-04-30 MED ORDER — HEPARIN SOD (PORK) LOCK FLUSH 100 UNIT/ML IV SOLN
500.0000 [IU] | Freq: Once | INTRAVENOUS | Status: AC | PRN
  Administered 2017-04-30: 500 [IU]

## 2017-04-30 MED ORDER — SODIUM CHLORIDE 0.9% FLUSH
10.0000 mL | INTRAVENOUS | Status: DC | PRN
  Administered 2017-04-30: 10 mL
  Filled 2017-04-30: qty 10

## 2017-05-01 ENCOUNTER — Ambulatory Visit: Payer: Medicare Other

## 2017-05-01 ENCOUNTER — Ambulatory Visit
Admission: RE | Admit: 2017-05-01 | Discharge: 2017-05-01 | Disposition: A | Discharge status: Home / Self Care | Payer: Medicare Other | Source: Ambulatory Visit | Attending: Radiation Oncology | Admitting: Radiation Oncology

## 2017-05-01 DIAGNOSIS — C155 Malignant neoplasm of lower third of esophagus: Secondary | ICD-10-CM | POA: Diagnosis not present

## 2017-05-02 ENCOUNTER — Ambulatory Visit
Admission: RE | Admit: 2017-05-02 | Discharge: 2017-05-02 | Disposition: A | Discharge status: Home / Self Care | Payer: Medicare Other | Source: Ambulatory Visit | Attending: Radiation Oncology | Admitting: Radiation Oncology

## 2017-05-02 ENCOUNTER — Ambulatory Visit: Payer: Medicare Other

## 2017-05-02 DIAGNOSIS — C155 Malignant neoplasm of lower third of esophagus: Secondary | ICD-10-CM | POA: Diagnosis not present

## 2017-05-05 ENCOUNTER — Ambulatory Visit
Admission: RE | Admit: 2017-05-05 | Discharge: 2017-05-05 | Disposition: A | Discharge status: Home / Self Care | Payer: Medicare Other | Source: Ambulatory Visit | Attending: Radiation Oncology | Admitting: Radiation Oncology

## 2017-05-05 ENCOUNTER — Ambulatory Visit: Payer: Medicare Other

## 2017-05-05 ENCOUNTER — Other Ambulatory Visit: Payer: Self-pay | Admitting: *Deleted

## 2017-05-05 DIAGNOSIS — C155 Malignant neoplasm of lower third of esophagus: Secondary | ICD-10-CM | POA: Diagnosis not present

## 2017-05-05 MED ORDER — SUCRALFATE 1 G PO TABS: 1.0000 g | ORAL_TABLET | Freq: Three times a day (TID) | ORAL | 4 refills | Status: DC

## 2017-05-07 ENCOUNTER — Encounter: Payer: Self-pay | Admitting: Oncology

## 2017-05-07 ENCOUNTER — Ambulatory Visit
Admission: RE | Admit: 2017-05-07 | Discharge: 2017-05-07 | Disposition: A | Discharge status: Home / Self Care | Payer: Medicare Other | Source: Ambulatory Visit | Attending: Radiation Oncology | Admitting: Radiation Oncology

## 2017-05-07 ENCOUNTER — Ambulatory Visit: Payer: Medicare Other

## 2017-05-07 DIAGNOSIS — C155 Malignant neoplasm of lower third of esophagus: Secondary | ICD-10-CM | POA: Diagnosis not present

## 2017-05-08 ENCOUNTER — Other Ambulatory Visit: Payer: Self-pay

## 2017-05-08 ENCOUNTER — Ambulatory Visit
Admission: RE | Admit: 2017-05-08 | Discharge: 2017-05-08 | Disposition: A | Discharge status: Home / Self Care | Payer: Medicare Other | Source: Ambulatory Visit | Attending: Radiation Oncology | Admitting: Radiation Oncology

## 2017-05-08 ENCOUNTER — Ambulatory Visit: Payer: Medicare Other

## 2017-05-08 DIAGNOSIS — C155 Malignant neoplasm of lower third of esophagus: Secondary | ICD-10-CM | POA: Diagnosis not present

## 2017-05-08 DIAGNOSIS — D481 Neoplasm of uncertain behavior of connective and other soft tissue: Secondary | ICD-10-CM | POA: Insufficient documentation

## 2017-05-09 ENCOUNTER — Ambulatory Visit (INDEPENDENT_AMBULATORY_CARE_PROVIDER_SITE_OTHER): Payer: Medicare Other | Admitting: Gastroenterology

## 2017-05-09 ENCOUNTER — Ambulatory Visit: Payer: Medicare Other

## 2017-05-09 ENCOUNTER — Other Ambulatory Visit: Payer: Self-pay

## 2017-05-09 ENCOUNTER — Encounter: Payer: Self-pay | Admitting: Gastroenterology

## 2017-05-09 VITALS — BP 127/88 | HR 67 | Ht 62.5 in | Wt 226.6 lb

## 2017-05-09 DIAGNOSIS — C159 Malignant neoplasm of esophagus, unspecified: Secondary | ICD-10-CM

## 2017-05-09 NOTE — Patient Instructions (Signed)
F/u 6 months

## 2017-05-09 NOTE — Progress Notes (Signed)
[name], MD [address]  [place]  [place], [place] [address]  Main: [phone]  Fax: [phone]   Primary Care Physician: Tonia Ghent, MD  Primary Gastroenterologist:  Dr. [name]  Chief Complaint  Patient presents with  . Follow-up    follow up hospital/esophageal cancer    HPI: [name] is a 82 y.o. female recently admitted to the hospital in Nov 2018 for intermittent dysphagia and 20 lb weight loss with EGD revealing Esophageal Tumor from 35cm to 40cm (5cm mass) extending to the cardia. GE Junction at 37 cm. GJ anastomosis from previous Gastric Bypass n 2004 was at 42cm. Biopsy showed adnocarcinoma. Also was found to have liver lesions and biopsy showed metastatic adenocarcinoma consistent with esophageal origin.   Pt. Has been seeing Oncology and is on Chemo with FOLFOX and on radiation therapy. She denies any dysphagia or episodes of food impaction. Saw a nutritionist who encouraged her to eat well and worked on what kind of foods to eat, chew food well, in small bites and she is doing well with this. No abdominal Pain, N/V, fever/chills or altered bowel habits.   Colonoscopy in 2013 by Dr. Gustavo Lah showed diverticulosis, redundant colon, cecum seen at a distance partly obstructed with watery prep. Repeat recommended in 4 yrs. 2009 Colonoscopy was complete and 2 polyps 67mm and 10 mm removed. 2006 Colonoscopy 9mm and 10 mm polyp removed. Path report from these procedures are not available.   Current Outpatient Medications  Medication Sig Dispense Refill  . acetaminophen (TYLENOL) 325 MG tablet Take 650 mg by mouth every 6 (six) hours as needed for moderate pain or headache.     . ALBUTEROL SULFATE HFA IN Inhale into the lungs.    . calcium carbonate (TUMS EX) 750 MG chewable tablet Chew by mouth.    . Cholecalciferol (VITAMIN D) 2000 UNITS CAPS Take 2,000 Units by mouth daily.    . cyanocobalamin 500 MCG tablet Take 500 mcg every  other day by mouth.    . Diclofenac Sodium 1 % CREA Apply to left arm as needed for pain three times daily 120 g 0  . Ferrous Sulfate 142 (45 Fe) MG TBCR Take by mouth.    . fluticasone (FLONASE) 50 MCG/ACT nasal spray Place 1-2 sprays into both nostrils daily.     Marland Kitchen gabapentin (NEURONTIN) 100 MG capsule Take by mouth.    . hydrocortisone cream 1 % Apply 1 application topically daily as needed for itching.     . lidocaine-prilocaine (EMLA) cream Apply to affected area once 30 g 3  . loperamide (IMODIUM) 2 MG capsule Take 1 capsule (2 mg total) by mouth See admin instructions. With onset of loose stool, take 4mg  followed by 2mg  every 2 hours until 12 hours have passed without loose bowel movement. Maximum: 16 mg/day 120 capsule 0  . loratadine (CLARITIN) 10 MG tablet Take 10 mg by mouth daily.    Marland Kitchen LYRICA 150 MG capsule TAKE 1 CAPSULE BY MOUTH TWO TIMES DAILY 180 capsule 1  . metoprolol succinate (TOPROL-XL) 50 MG 24 hr tablet TAKE 1 TABLET BY MOUTH DAILY 90 tablet 3  . ondansetron (ZOFRAN) 8 MG tablet Take 1 tablet (8 mg total) by mouth 2 (two) times daily as needed for refractory nausea / vomiting. Start on day 3 after chemotherapy. 30 tablet 1  . pyridOXINE (VITAMIN B-6) 100 MG tablet Take 1 tablet (100 mg total) by mouth daily. 30 tablet 3  . senna-docusate (  SENOKOT-S) 8.6-50 MG tablet Take 1 tablet at bedtime as needed by mouth for mild constipation. (Patient taking differently: Take 1 tablet by mouth at bedtime as needed for mild constipation. )    . sucralfate (CARAFATE) 1 g tablet Take 1 tablet (1 g total) by mouth 3 (three) times daily before meals. 90 tablet 4  . traMADol (ULTRAM) 50 MG tablet Take 1 tablet (50 mg total) by mouth 3 (three) times daily as needed for moderate pain. 30 tablet 1  . traZODone (DESYREL) 50 MG tablet TAKE 1/2 TABLET BY MOUTH AT BEDTIME IF NEEDED FOR SLEEP 45 tablet 3  . bisacodyl (DULCOLAX) 10 MG suppository Place rectally.    Marland Kitchen dexamethasone (DECADRON) 4 MG  tablet Take 2 tablets (8 mg total) by mouth daily. Start the day after chemotherapy for 2 days. Take with food. (Patient not taking: Reported on 05/09/2017) 30 tablet 1  . diazepam (VALIUM) 2 MG tablet Take by mouth.    . meloxicam (MOBIC) 7.5 MG tablet Take by mouth.    Marland Kitchen omeprazole (PRILOSEC) 40 MG capsule Take 1 capsule (40 mg total) by mouth daily. (Patient not taking: Reported on 05/09/2017) 30 capsule 0  . oxybutynin (DITROPAN) 5 MG tablet TAKE 1/2 TABLET BY MOUTH IN THE MORNING AND 1 TABLET AT BEDTIME    . oxyCODONE-acetaminophen (PERCOCET/ROXICET) 5-325 MG tablet Take by mouth.    . Pediatric Multiple Vitamins (FLINTSTONES MULTIVITAMIN PO) Take by mouth.    Vladimir Faster Glycol-Propyl Glycol (SYSTANE OP) Apply 1 drop to eye daily as needed (dry eyes).    . prochlorperazine (COMPAZINE) 10 MG tablet Take 1 tablet (10 mg total) by mouth every 6 (six) hours as needed (Nausea or vomiting). 30 tablet 1   No current facility-administered medications for this visit.     Allergies as of 05/09/2017 - Review Complete 05/09/2017  Allergen Reaction Noted  . Cefuroxime Nausea Only 07/11/2015  . Codeine Nausea Only 01/22/2011  . Doxycycline Other (See Comments) 12/03/2013  . Gabapentin Other (See Comments) 12/19/2016  . Iohexol Itching 07/15/2013  . Other Other (See Comments) 12/03/2013  . Oxycodone Other (See Comments) 12/19/2016  . Quinolones Swelling 12/03/2013  . Synvisc [hylan g-f 20] Swelling 01/22/2011    ROS:  General: Negative for anorexia, weight loss, fever, chills, fatigue, weakness. ENT: Negative for hoarseness, difficulty swallowing , nasal congestion. CV: Negative for chest pain, angina, palpitations, dyspnea on exertion, peripheral edema.  Respiratory: Negative for dyspnea at rest, dyspnea on exertion, cough, sputum, wheezing.  GI: See history of present illness. GU:  Negative for dysuria, hematuria, urinary incontinence, urinary frequency, nocturnal urination.  Endo:  Negative for unusual weight change.    Physical Examination:   BP 127/88   Pulse 67   Ht 5' 2.5" (1.588 m)   Wt 226 lb 9.6 oz (102.8 kg)   BMI 40.79 kg/m   General: Well-nourished, well-developed in no acute distress.  Eyes: No icterus. Conjunctivae pink. Mouth: Oropharyngeal mucosa moist and pink , no lesions erythema or exudate. Lungs: Clear to auscultation bilaterally. Non-labored. Abdomen: Bowel sounds are normal, nontender, nondistended, no hepatosplenomegaly or masses, no abdominal bruits or hernia , no rebound or guarding.   Extremities: No lower extremity edema. No clubbing or deformities. Neuro: Alert and oriented x 3.  Grossly intact. Skin: Warm and dry, no jaundice.   Psych: Alert and cooperative, normal mood and affect.   Labs: CMP     Component Value Date/Time   NA 142 04/28/2017 0953   NA 138  07/13/2013 0609   K 4.5 04/28/2017 0953   K 4.1 07/13/2013 0609   CL 105 04/28/2017 0953   CL 104 07/13/2013 0609   CO2 31 04/28/2017 0953   CO2 29 07/13/2013 0609   GLUCOSE 88 04/28/2017 0953   GLUCOSE 115 (H) 07/13/2013 0609   BUN 16 04/28/2017 0953   BUN 15 07/13/2013 0609   CREATININE 0.65 04/28/2017 0953   CREATININE 0.67 07/13/2013 0609   CALCIUM 8.9 04/28/2017 0953   CALCIUM 8.3 (L) 07/13/2013 0609   PROT 6.7 04/28/2017 0953   ALBUMIN 3.6 04/28/2017 0953   AST 24 04/28/2017 0953   ALT 15 04/28/2017 0953   ALKPHOS 60 04/28/2017 0953   BILITOT 0.3 04/28/2017 0953   GFRNONAA >60 04/28/2017 0953   GFRNONAA >60 07/13/2013 0609   GFRAA >60 04/28/2017 0953   GFRAA >60 07/13/2013 0609   Lab Results  Component Value Date   WBC 4.9 04/28/2017   HGB 10.7 (L) 04/28/2017   HCT 33.3 (L) 04/28/2017   MCV 92.9 04/28/2017   PLT 186 04/28/2017    Imaging Studies: US Biopsy (liver)  Result Date: 04/09/2017 CLINICAL DATA:  Adenocarcinoma of the distal esophagus with multiple liver lesions. Liver lesion biopsy has been requested to confirm the presence of stage  IV disease. EXAM: ULTRASOUND GUIDED CORE BIOPSY OF LIVER MEDICATIONS: 1.0 mg IV Versed; 50 mcg IV Fentanyl Total Moderate Sedation Time: 15 minutes. The patient's level of consciousness and physiologic status were continuously monitored during the procedure by Radiology nursing. PROCEDURE: The procedure, risks, benefits, and alternatives were explained to the patient. Questions regarding the procedure were encouraged and answered. The patient understands and consents to the procedure. A time out was performed prior to initiating the procedure. Ultrasound was performed to localize liver lesions. A lesion in the inferior right lobe was chosen for biopsy. The abdominal wall was prepped with chlorhexidine in a sterile fashion, and a sterile drape was applied covering the operative field. A sterile gown and sterile gloves were used for the procedure. Local anesthesia was provided with 1% Lidocaine. A 17 gauge needle was advanced into the liver at the level of a liver lesion. After confirming needle tip position, a total of 3 separate coaxial 18 gauge core biopsy samples were obtained through the mass. Core biopsy samples were submitted in formalin. A slurry of Gel-Foam pledgets was then injected through the outer needle as it was withdrawn. Additional ultrasound was performed. COMPLICATIONS: None. FINDINGS: Multiple lesions were identified throughout the liver. The most accessible for biopsy is an anterior subcapsular lesion within the inferior right lobe measuring approximately 4 cm in greatest diameter. Solid tissue was obtained. IMPRESSION: Ultrasound-guided core biopsy performed of a 4 cm mass within the right lobe of the liver. Electronically Signed   By: Aletta Edouard M.D.   On: 04/09/2017 12:49    Assessment and Plan:   ARNELLE NALE is a 82 y.o. y/o female here for follow up after recent diagnosis of esophageal cancer (adenocarcinoma) with Liver Mets  Pt. Receiving Chemo and radiation therapy and  following with Oncology closely No dysphagia at this time Continue follow up with Oncology If oncology needs repeat EGD done to evaluate size of esophageal lesion or progress after radiation and chemo in the future, this can be arranged.  If pt develops dysphagia and is unable to maintain nutrition in the future, can consider esophagal stenting of feeding tube in the future. Pt asked to call us if this occurs Depending on  clinical course repeat colonoscopy for polyp surveillance can be arranged after chemo and radiation therapy is completed and she medically optimized in that regard   Dr [name]

## 2017-05-12 ENCOUNTER — Inpatient Hospital Stay: Payer: Medicare Other

## 2017-05-12 ENCOUNTER — Other Ambulatory Visit: Payer: Self-pay | Admitting: Urgent Care

## 2017-05-12 ENCOUNTER — Telehealth: Payer: Self-pay | Admitting: *Deleted

## 2017-05-12 ENCOUNTER — Encounter: Payer: Self-pay | Admitting: Hematology and Oncology

## 2017-05-12 ENCOUNTER — Ambulatory Visit
Admission: RE | Admit: 2017-05-12 | Discharge: 2017-05-12 | Disposition: A | Discharge status: Home / Self Care | Payer: Medicare Other | Source: Ambulatory Visit | Attending: Radiation Oncology | Admitting: Radiation Oncology

## 2017-05-12 ENCOUNTER — Inpatient Hospital Stay (HOSPITAL_BASED_OUTPATIENT_CLINIC_OR_DEPARTMENT_OTHER): Payer: Medicare Other | Admitting: Hematology and Oncology

## 2017-05-12 ENCOUNTER — Ambulatory Visit: Payer: Medicare Other

## 2017-05-12 ENCOUNTER — Other Ambulatory Visit: Payer: Self-pay | Admitting: Hematology and Oncology

## 2017-05-12 VITALS — BP 128/89 | HR 64 | Resp 16 | Wt 226.3 lb

## 2017-05-12 DIAGNOSIS — R1310 Dysphagia, unspecified: Secondary | ICD-10-CM | POA: Diagnosis not present

## 2017-05-12 DIAGNOSIS — D500 Iron deficiency anemia secondary to blood loss (chronic): Secondary | ICD-10-CM | POA: Diagnosis not present

## 2017-05-12 DIAGNOSIS — K769 Liver disease, unspecified: Secondary | ICD-10-CM | POA: Diagnosis not present

## 2017-05-12 DIAGNOSIS — Z5111 Encounter for antineoplastic chemotherapy: Secondary | ICD-10-CM

## 2017-05-12 DIAGNOSIS — Z801 Family history of malignant neoplasm of trachea, bronchus and lung: Secondary | ICD-10-CM

## 2017-05-12 DIAGNOSIS — Z9989 Dependence on other enabling machines and devices: Secondary | ICD-10-CM

## 2017-05-12 DIAGNOSIS — J449 Chronic obstructive pulmonary disease, unspecified: Secondary | ICD-10-CM | POA: Diagnosis not present

## 2017-05-12 DIAGNOSIS — C155 Malignant neoplasm of lower third of esophagus: Secondary | ICD-10-CM | POA: Diagnosis not present

## 2017-05-12 DIAGNOSIS — I10 Essential (primary) hypertension: Secondary | ICD-10-CM | POA: Diagnosis not present

## 2017-05-12 DIAGNOSIS — G4733 Obstructive sleep apnea (adult) (pediatric): Secondary | ICD-10-CM | POA: Diagnosis not present

## 2017-05-12 DIAGNOSIS — G629 Polyneuropathy, unspecified: Secondary | ICD-10-CM

## 2017-05-12 DIAGNOSIS — K209 Esophagitis, unspecified: Secondary | ICD-10-CM

## 2017-05-12 DIAGNOSIS — Z808 Family history of malignant neoplasm of other organs or systems: Secondary | ICD-10-CM

## 2017-05-12 DIAGNOSIS — I351 Nonrheumatic aortic (valve) insufficiency: Secondary | ICD-10-CM

## 2017-05-12 DIAGNOSIS — M19012 Primary osteoarthritis, left shoulder: Secondary | ICD-10-CM | POA: Diagnosis not present

## 2017-05-12 DIAGNOSIS — M25512 Pain in left shoulder: Secondary | ICD-10-CM | POA: Diagnosis not present

## 2017-05-12 DIAGNOSIS — Z800 Family history of malignant neoplasm of digestive organs: Secondary | ICD-10-CM

## 2017-05-12 DIAGNOSIS — Z79899 Other long term (current) drug therapy: Secondary | ICD-10-CM

## 2017-05-12 DIAGNOSIS — Z806 Family history of leukemia: Secondary | ICD-10-CM

## 2017-05-12 DIAGNOSIS — Z87442 Personal history of urinary calculi: Secondary | ICD-10-CM

## 2017-05-12 DIAGNOSIS — Z9884 Bariatric surgery status: Secondary | ICD-10-CM

## 2017-05-12 DIAGNOSIS — Z8052 Family history of malignant neoplasm of bladder: Secondary | ICD-10-CM

## 2017-05-12 DIAGNOSIS — Z7189 Other specified counseling: Secondary | ICD-10-CM

## 2017-05-12 DIAGNOSIS — Z85820 Personal history of malignant melanoma of skin: Secondary | ICD-10-CM

## 2017-05-12 DIAGNOSIS — Z803 Family history of malignant neoplasm of breast: Secondary | ICD-10-CM

## 2017-05-12 DIAGNOSIS — Z87891 Personal history of nicotine dependence: Secondary | ICD-10-CM

## 2017-05-12 LAB — CBC WITH DIFFERENTIAL/PLATELET
BASOS ABS: 0 10*3/uL (ref 0–0.1)
Basophils Relative: 1 %
EOS PCT: 6 %
Eosinophils Absolute: 0.2 10*3/uL (ref 0–0.7)
HEMATOCRIT: 37.4 % (ref 35.0–47.0)
Hemoglobin: 12 g/dL (ref 12.0–16.0)
LYMPHS ABS: 0.6 10*3/uL — AB (ref 1.0–3.6)
LYMPHS PCT: 16 %
MCH: 29.8 pg (ref 26.0–34.0)
MCHC: 32.1 g/dL (ref 32.0–36.0)
MCV: 92.9 fL (ref 80.0–100.0)
Monocytes Absolute: 0.5 10*3/uL (ref 0.2–0.9)
Monocytes Relative: 14 %
NEUTROS ABS: 2.3 10*3/uL (ref 1.4–6.5)
Neutrophils Relative %: 63 %
PLATELETS: 103 10*3/uL — AB (ref 150–440)
RBC: 4.03 MIL/uL (ref 3.80–5.20)
RDW: 16 % — ABNORMAL HIGH (ref 11.5–14.5)
WBC: 3.6 10*3/uL (ref 3.6–11.0)

## 2017-05-12 LAB — COMPREHENSIVE METABOLIC PANEL
ALBUMIN: 3.5 g/dL (ref 3.5–5.0)
ALT: 20 U/L (ref 14–54)
ANION GAP: 9 (ref 5–15)
AST: 34 U/L (ref 15–41)
Alkaline Phosphatase: 62 U/L (ref 38–126)
BUN: 14 mg/dL (ref 6–20)
CHLORIDE: 101 mmol/L (ref 101–111)
CO2: 29 mmol/L (ref 22–32)
Calcium: 9 mg/dL (ref 8.9–10.3)
Creatinine, Ser: 0.82 mg/dL (ref 0.44–1.00)
GFR calc Af Amer: >60 mL/min (ref >60)
GFR calc non Af Amer: >60 mL/min (ref >60)
GLUCOSE: 145 mg/dL — AB (ref 65–99)
POTASSIUM: 4.2 mmol/L (ref 3.5–5.1)
Sodium: 139 mmol/L (ref 135–145)
Total Bilirubin: 0.5 mg/dL (ref 0.3–1.2)
Total Protein: 6.7 g/dL (ref 6.5–8.1)

## 2017-05-12 LAB — FERRITIN: Ferritin: 144 ng/mL (ref 11–307)

## 2017-05-12 LAB — IRON AND TIBC
Iron: 89 ug/dL (ref 28–170)
SATURATION RATIOS: 26 % (ref 10.4–31.8)
TIBC: 340 ug/dL (ref 250–450)
UIBC: 251 ug/dL

## 2017-05-12 MED ORDER — SODIUM CHLORIDE 0.9 % IV SOLN: 10.0000 mg | Freq: Once | INTRAVENOUS | Status: DC

## 2017-05-12 MED ORDER — LEUCOVORIN CALCIUM INJECTION 350 MG
850.0000 mg | Freq: Once | INTRAVENOUS | Status: DC
  Filled 2017-05-12: qty 42.5

## 2017-05-12 MED ORDER — PALONOSETRON HCL INJECTION 0.25 MG/5ML
0.2500 mg | Freq: Once | INTRAVENOUS | Status: AC
  Administered 2017-05-12: 0.25 mg via INTRAVENOUS
  Filled 2017-05-12: qty 5

## 2017-05-12 MED ORDER — FLUOROURACIL CHEMO INJECTION 2.5 GM/50ML
400.0000 mg/m2 | Freq: Once | INTRAVENOUS | Status: AC
  Administered 2017-05-12: 850 mg via INTRAVENOUS
  Filled 2017-05-12: qty 17

## 2017-05-12 MED ORDER — HEPARIN SOD (PORK) LOCK FLUSH 100 UNIT/ML IV SOLN: 500.0000 [IU] | Freq: Once | INTRAVENOUS | Status: DC

## 2017-05-12 MED ORDER — SODIUM CHLORIDE 0.9% FLUSH
10.0000 mL | INTRAVENOUS | Status: DC | PRN
  Administered 2017-05-12: 10 mL via INTRAVENOUS
  Filled 2017-05-12: qty 10

## 2017-05-12 MED ORDER — SODIUM CHLORIDE 0.9 % IV SOLN
2400.0000 mg/m2 | INTRAVENOUS | Status: DC
  Administered 2017-05-12: 5200 mg via INTRAVENOUS
  Filled 2017-05-12: qty 100

## 2017-05-12 MED ORDER — LEUCOVORIN CALCIUM INJECTION 100 MG
20.0000 mg/m2 | Freq: Once | INTRAMUSCULAR | Status: AC
  Administered 2017-05-12: 44 mg via INTRAVENOUS
  Filled 2017-05-12: qty 2.2

## 2017-05-12 MED ORDER — DEXAMETHASONE SODIUM PHOSPHATE 10 MG/ML IJ SOLN
10.0000 mg | Freq: Once | INTRAMUSCULAR | Status: AC
  Administered 2017-05-12: 10 mg via INTRAVENOUS
  Filled 2017-05-12: qty 1

## 2017-05-12 NOTE — Progress Notes (Signed)
Hematology/Oncology Follow Up Note [facility]  Telephone:([phone] [phone]  Patient Care Team: Tonia Ghent, MD as PCP - General (Family Medicine)   Name of the patient: [name]  [id number]  [birth year]   REASON FOR VISIT Follow up of management of esophageal adenocarcinoma and the management of iron deficiency anemia. Assessment prior to cycle #3 FOLFOX chemotherapy + concurrent radiation.   HISTORY OF PRESENT ILLNESS Patient is a 82 year old female with past medical history listed as below who presented with complaints of epigastric discomfort, and sensation of food being stuck. Patient also reports that epigastric pain is aching in nature, and intermittent, for about several months. Associated with weight loss about 20 pounds in the past year. Patient also felt shortness of breath and in the emergency room oxygen saturation will at 83%. CT abdomen showed multiple masses in the liver, unknown etiology. EGD during admission showed partially obstructive esophageal mass at the distal part of esophagus. Biopsy was taken.  Pathology showed esophageal adenocarcinoma. US biopsy of liver lesion to proof distant metastasis.  Report family history of breast cancer, but she has been having routine mammograms. Lives alone. She's a former smoker.  INTERVAL HISTORY Patient presents for evaluation prior to receiving cycle #3 FOLFOX chemotherapy for esophageal adenocarcinoma.   Patient was last seen in the medical oncology clinic by Dr. Tasia Catchings on 04/28/2017. At that time, she noted that her energy was "better". She was using daily stool softeners and laxatives as needed, which had somewhat improved her constipation. Patient denied nausea, vomiting, and significant pain. She had neuropathy and was taking Lyrica. Oxaliplatin dose was reduced to 89m/m2. WBC 4900 with an A[place]of 3300. Hemoglobin was 10.7, hematocrit 33.3, MCV 92.9, and platelets 186,000.  Chemistries were unremarkable.  Patient continues radiation therapy with Dr. CBaruch Gouty Plans are for a palliative course of IMRT delivering 5040 cGy in 28 fractions to her distal esophagus. Radiation therapy is scheduled to finish on or around 06/07/2017.  During the interim, she has done well. She denies acute concerns. Patient denies increasing dysphagia and esophagitis symptoms associated with her radiation therapy. She is eating ok and her weight remains stable. 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 She is taking Lyrica and notes that her neuropathy is stable.  Patient denies any B symptoms and interval infections.   Patient is accompanied to the clinic today by her sister.   Current Pain Regimen:  Tramadol to 554mQ6h PRN. She uses half tablet sometimes.    Review of systems Review of Systems  Constitutional: Negative for chills, fever, malaise/fatigue and weight loss.  HENT: Negative for congestion, ear pain, hearing loss, nosebleeds, sore throat and tinnitus.   Eyes: Negative for blurred vision, double vision and redness.  Respiratory: Negative for cough and wheezing.   Cardiovascular: Negative for chest pain, palpitations, leg swelling and PND.  Gastrointestinal: Negative for abdominal pain, constipation, diarrhea, melena, nausea and vomiting.       No increased dysphagia or symptoms of esophagitis  Genitourinary: Negative for dysuria, flank pain, frequency, hematuria and urgency.  Musculoskeletal: Negative for falls, myalgias and neck pain.  Skin: Negative for rash.  Neurological: Positive for sensory change. Negative for dizziness, tingling and headaches.       Pre-existing neuropathy  Endo/Heme/Allergies: Negative for environmental allergies. Does not bruise/bleed easily.  Psychiatric/Behavioral: Negative for depression, hallucinations, substance abuse and suicidal ideas.    Allergies  Allergen Reactions  . Cefuroxime Nausea Only  .  Codeine Nausea Only  . Doxycycline Other (See Comments)    Lip swelling  . Gabapentin Other (See Comments)    Swelling.   . Iohexol Itching    07-15-13 pt developed itching on fingers after contrast given. Dr. Irish Elders looked at pt and said to put in system as allergy. BB  . Other Other (See Comments)    Contrast Dye- caused fever, chills, awful feeling Bandages - itching, rash  . Oxycodone Other (See Comments)    nausea  . Quinolones Swelling    Lip swelling  . Synvisc [Hylan G-F 20] Swelling     Past Medical History:  Diagnosis Date  . Arthritis   . Asthma   . COPD (chronic obstructive pulmonary disease) ([place])   . Dysphonia   . Dyspnea   . Esophageal cancer ([place])   . Heart murmur   . History of kidney stones   . Hypertension   . Melanoma ([place])   . OAB (overactive bladder)   . OSA on CPAP   . 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 Pupil asymmetry    R pupil defect  . Skin cancer   . Sleep apnea   . Spinal cord abscess      Past Surgical History:  Procedure Laterality Date  . ABDOMINAL HYSTERECTOMY    . ANTERIOR CERVICAL DECOMP/DISCECTOMY FUSION N/A 07/16/2013   Procedure: ANTERIOR CERVICAL DECOMPRESSION/DISCECTOMY FUSION mutlipleLEVELS C4-7;  Surgeon: Erline Levine, MD;  Location: [facility]S;  Service: Neurosurgery;  Laterality: N/A;  . APPENDECTOMY    . carpel tunn Bilateral   . CATARACT EXTRACTION    . CATARACT EXTRACTION, BILATERAL    . CHOLECYSTECTOMY    . dental implant    . ESOPHAGOGASTRODUODENOSCOPY Left 03/21/2017   Procedure: ESOPHAGOGASTRODUODENOSCOPY (EGD);  Surgeon: Virgel Manifold, MD;  Location: [facility]COPY;  Service: Endoscopy;  Laterality: Left;  Marland Kitchen GASTRIC BYPASS    . HIATAL HERNIA REPAIR    . MELANOMA EXCISION    . PORTA CATH INSERTION N/A 04/07/2017   Procedure: PORTA CATH INSERTION;  Surgeon: Algernon Huxley, MD;  Location: [facility] LAB;  Service: Cardiovascular;  Laterality: N/A;  . POSTERIOR CERVICAL  FUSION/FORAMINOTOMY N/A 07/28/2013   Procedure: Cervical four-seven  posterior cervical fusion;  Surgeon: Erline Levine, MD;  Location: [facility] ORS;  Service: Neurosurgery;  Laterality: N/A;  . TONSILLECTOMY    . TOTAL KNEE ARTHROPLASTY      Social History   Socioeconomic History  . Marital status: Widowed    Spouse name: Not on file  . Number of children: 2  . Years of education: Not on file  . Highest education level: Master's degree (e.g., MA, MS, MEng, MEd, MSW, MBA)  Social Needs  . Financial resource strain: Not hard at all  . Food insecurity - worry: Never true  . Food insecurity - inability: Never true  . Transportation needs - medical: No  . Transportation needs - non-medical: No  Occupational History  . Not on file  Tobacco Use  . Smoking status: Former Smoker    Packs/day: 1.00    Years: 20.00    Pack years: 20.00    Types: Cigarettes    Last attempt to quit: 1977    Years since quitting: 42.0  . Smokeless tobacco: Never Used  Substance and Sexual Activity  . Alcohol use: No  . Drug use: No  . Sexual activity: No  Other Topics Concern  . Not on file  Social History Narrative   Marital Status- Widowed    Lives by herself    Employement- Retired Pharmacist, hospital   Exercise hx- Does PT     Family History  Problem Relation Age of Onset  . Breast cancer Mother 26  . Arthritis-Osteo Mother   . Breast cancer Sister 68  . Bladder Cancer Sister   . Bladder Cancer Father   . Tongue cancer Father   . Congenital heart disease Father   . Prostate cancer Brother   . Uterine cancer Maternal Aunt   . Lung cancer Paternal Uncle   . Leukemia Maternal Grandmother   . Liver cancer Paternal Grandmother   . Colon cancer Neg Hx      Current Outpatient Medications:  .  acetaminophen (TYLENOL) 325 MG tablet, Take 650 mg by mouth every 6 (six) hours as needed for moderate pain or headache. , Disp: , Rfl:  .  ALBUTEROL SULFATE HFA IN, Inhale into the lungs., Disp: , Rfl:  .   bisacodyl (DULCOLAX) 10 MG suppository, Place rectally., Disp: , Rfl:  .  calcium carbonate (TUMS EX) 750 MG chewable tablet, Chew by mouth., Disp: , Rfl:  .  Cholecalciferol (VITAMIN D) 2000 UNITS CAPS, Take 2,000 Units by mouth daily., Disp: , Rfl:  .  cyanocobalamin 500 MCG tablet, Take 500 mcg every other day by mouth., Disp: , Rfl:  .  dexamethasone (DECADRON) 4 MG tablet, Take 2 tablets (8 mg total) by mouth daily. Start the day after chemotherapy for 2 days. Take with food. (Patient not taking: Reported on 05/09/2017), Disp: 30 tablet, Rfl: 1 .  diazepam (VALIUM) 2 MG tablet, Take by mouth., Disp: , Rfl:  .  Diclofenac Sodium 1 % CREA, Apply to left arm as needed for pain three times daily, Disp: 120 g, Rfl: 0 .  Ferrous Sulfate 142 (45 Fe) MG TBCR, Take by mouth., Disp: , Rfl:  .  fluticasone (FLONASE) 50 MCG/ACT nasal spray, Place 1-2 sprays into both nostrils daily. , Disp: , Rfl:  .  gabapentin (NEURONTIN) 100 MG capsule, Take by mouth., Disp: , Rfl:  .  hydrocortisone cream 1 %, Apply 1 application topically daily as needed for itching. , Disp: , Rfl:  .  lidocaine-prilocaine (EMLA) cream, Apply to affected area once, Disp: 30 g, Rfl: 3 .  loperamide (IMODIUM) 2 MG capsule, Take 1 capsule (2 mg total) by mouth See admin instructions. With onset of loose stool, take 40m followed by 25mevery 2 hours until 12 hours have passed without loose bowel movement. Maximum: 16 mg/day, Disp: 120 capsule, Rfl: 0 .  loratadine (CLARITIN) 10 MG tablet, Take 10 mg by mouth daily., Disp: , Rfl:  .  LYRICA 150 MG capsule, TAKE 1 CAPSULE BY MOUTH TWO TIMES DAILY, Disp: 180 capsule, Rfl: 1 .  meloxicam (MOBIC) 7.5 MG tablet, Take by mouth., Disp: , Rfl:  .  metoprolol succinate (TOPROL-XL) 50 MG 24 hr tablet, TAKE 1 TABLET BY MOUTH DAILY, Disp: 90 tablet, Rfl: 3 .  omeprazole (PRILOSEC) 40 MG capsule, Take 1 capsule (40 mg total) by mouth daily. (Patient not taking: Reported on 05/09/2017), Disp: 30  capsule, Rfl: 0 .  ondansetron (ZOFRAN) 8 MG tablet, Take 1 tablet (8 mg total) by mouth 2 (two) times daily as needed for refractory nausea / vomiting. Start on day 3 after chemotherapy., Disp: 30 tablet, Rfl: 1 .  oxybutynin (DITROPAN) 5 MG tablet, TAKE 1/2 TABLET BY MOUTH IN THE MORNING AND 1 TABLET AT  BEDTIME, Disp: , Rfl:  .  oxyCODONE-acetaminophen (PERCOCET/ROXICET) 5-325 MG tablet, Take by mouth., Disp: , Rfl:  .  Pediatric Multiple Vitamins (FLINTSTONES MULTIVITAMIN PO), Take by mouth., Disp: , Rfl:  .  Polyethyl Glycol-Propyl Glycol (SYSTANE OP), Apply 1 drop to eye daily as needed (dry eyes)., Disp: , Rfl:  .  prochlorperazine (COMPAZINE) 10 MG tablet, Take 1 tablet (10 mg total) by mouth every 6 (six) hours as needed (Nausea or vomiting)., Disp: 30 tablet, Rfl: 1 .  pyridOXINE (VITAMIN B-6) 100 MG tablet, Take 1 tablet (100 mg total) by mouth daily., Disp: 30 tablet, Rfl: 3 .  senna-docusate (SENOKOT-S) 8.6-50 MG tablet, Take 1 tablet at bedtime as needed by mouth for mild constipation. (Patient taking differently: Take 1 tablet by mouth at bedtime as needed for mild constipation. ), Disp: , Rfl:  .  sucralfate (CARAFATE) 1 g tablet, Take 1 tablet (1 g total) by mouth 3 (three) times daily before meals., Disp: 90 tablet, Rfl: 4 .  traMADol (ULTRAM) 50 MG tablet, Take 1 tablet (50 mg total) by mouth 3 (three) times daily as needed for moderate pain., Disp: 30 tablet, Rfl: 1 .  traZODone (DESYREL) 50 MG tablet, TAKE 1/2 TABLET BY MOUTH AT BEDTIME IF NEEDED FOR SLEEP, Disp: 45 tablet, Rfl: 3 No current facility-administered medications for this visit.   Facility-Administered Medications Ordered in Other Visits:  .  heparin lock flush 100 unit/mL, 500 Units, Intravenous, Once, Earlie Server, MD .  sodium chloride flush (NS) 0.9 % injection 10 mL, 10 mL, Intravenous, PRN, Earlie Server, MD, 10 mL at 05/12/17 5631  Physical exam:  Vitals:   05/12/17 1029  BP: 128/89  Pulse: 64  Resp: 16  Weight:  226 lb 4.8 oz (102.6 kg)   Physical Exam  Constitutional: She is oriented to person, place, and time and well-developed, well-nourished, and in no distress. No distress.  HENT:  Head: Normocephalic and atraumatic.  Eyes: Conjunctivae and EOM are normal. Pupils are equal, round, and reactive to light.  Neck: Normal range of motion. Neck supple.  Cardiovascular: Normal rate and regular rhythm.  Murmur heard. Pulmonary/Chest: Effort normal and breath sounds normal.  Abdominal: Soft. Bowel sounds are normal. She exhibits no distension and no mass. There is no tenderness.  Musculoskeletal: Normal range of motion. She exhibits no deformity.  Lymphadenopathy:       Head (right side): No preauricular, no posterior auricular and no occipital adenopathy present.       Head (left side): No preauricular, no posterior auricular and no occipital adenopathy present.    She has no cervical adenopathy.    She has no axillary adenopathy.       Right: No inguinal and no supraclavicular adenopathy present.       Left: No inguinal and no supraclavicular adenopathy present.  Neurological: She is alert and oriented to person, place, and time. She displays normal reflexes. No cranial nerve deficit.  Skin: Skin is warm and dry. No rash noted. No erythema.  Psychiatric: Affect normal.    CMP Latest Ref Rng & Units 05/12/2017  Glucose 65 - 99 mg/dL 145(H)  BUN 6 - 20 mg/dL 14  Creatinine 0.44 - 1.00 mg/dL 0.82  Sodium 135 - 145 mmol/L 139  Potassium 3.5 - 5.1 mmol/L 4.2  Chloride 101 - 111 mmol/L 101  CO2 22 - 32 mmol/L 29  Calcium 8.9 - 10.3 mg/dL 9.0  Total Protein 6.5 - 8.1 g/dL 6.7  Total Bilirubin 0.3 -  1.2 mg/dL 0.5  Alkaline Phos 38 - 126 U/L 62  AST 15 - 41 U/L 34  ALT 14 - 54 U/L 20   CBC Latest Ref Rng & Units 05/12/2017  WBC 3.6 - 11.0 K/uL 3.6  Hemoglobin 12.0 - 16.0 g/dL 12.0  Hematocrit 35.0 - 47.0 % 37.4  Platelets 150 - 440 K/uL 103(L)   Foundation one test, PD-L1 pending HER 2  negative.   Assessment and plan  Cancer Staging Malignant neoplasm of lower third of esophagus [facility]) Staging form: Esophagus - Adenocarcinoma, AJCC 8th Edition - Clinical stage from 04/10/2017: Stage IVB (cTX, cNX, pM1) - Signed by Earlie Server, MD on 04/10/2017  1. Malignant neoplasm of lower third of esophagus (HCC)   2. Neuropathy   3. Iron deficiency anemia due to chronic blood loss   4. Encounter for antineoplastic chemotherapy      1.  Labs today: CBC with diff, iron studies, ferritin, CEA 2.  Discuss iron stores. RBC indices normal and ferritin 144 after Venofer 200 mg x 4 doses.  3.  Discuss neuropathy. She denies increase in symptoms and that her neuropathy is at baseline. Will continue Lyrica as previously prescribed.  4.  Blood counts adequate for treatment.   Reviewed trend in counts since initiation of therapy.  WBC 3600 with an ANC of 2300.  Platelets 103,000. Due to concurrent radiation, patient cannot receive GCSF in order to boost WBC count.  Will proceed with cycle #3 of 5FU +LV today. Will hold Oxaliplatin this cycle.  5.  Discuss monitoring of tumor markers. Will add CEA today. If CEA has increased, consider follow-up imaging.  6.  Continue IMRT treatments as planned by radiation oncology.  7.  RTC on 05/14/2017 for pump disconnect.  8.  RTC in 1 week for labs (CBC with diff). 9.  RTC in 2 weeks for MD (Dr  Tasia Catchings) assessment, labs (CBC with diff, CMP, Mg), and cycle #4 FOLFOX.  Addendum:  CEA has decreased from 96.6 on 03/22/2017 to 36.8 on 05/12/2017.   Honor Loh, NP 05/12/17, 10:54 AM   I saw and evaluated the patient, participating in the key portions of the service and reviewing pertinent diagnostic studies and records.  I reviewed the nurse practitioner's note and agree with the findings and the plan.  The assessment and plan were discussed with the patient.  Multiple questions were asked by the patient and answered.   Nolon Stalls, MD 05/12/17, 10:54 AM

## 2017-05-12 NOTE — Telephone Encounter (Signed)
Patient needs CEA drawn today.  Barbette Or and asked her to draw when patient comes for treatment.  Called lab, but they were unable to test from what was drawn previously.

## 2017-05-13 ENCOUNTER — Encounter: Payer: Self-pay | Admitting: Hematology and Oncology

## 2017-05-13 DIAGNOSIS — J449 Chronic obstructive pulmonary disease, unspecified: Secondary | ICD-10-CM | POA: Diagnosis not present

## 2017-05-13 DIAGNOSIS — G4730 Sleep apnea, unspecified: Secondary | ICD-10-CM | POA: Diagnosis not present

## 2017-05-13 DIAGNOSIS — Z87442 Personal history of urinary calculi: Secondary | ICD-10-CM | POA: Diagnosis not present

## 2017-05-13 DIAGNOSIS — C155 Malignant neoplasm of lower third of esophagus: Secondary | ICD-10-CM | POA: Diagnosis present

## 2017-05-13 DIAGNOSIS — R634 Abnormal weight loss: Secondary | ICD-10-CM | POA: Diagnosis not present

## 2017-05-13 DIAGNOSIS — I10 Essential (primary) hypertension: Secondary | ICD-10-CM | POA: Diagnosis not present

## 2017-05-13 DIAGNOSIS — Z510 Encounter for antineoplastic radiation therapy: Secondary | ICD-10-CM | POA: Diagnosis not present

## 2017-05-13 DIAGNOSIS — J45909 Unspecified asthma, uncomplicated: Secondary | ICD-10-CM | POA: Diagnosis not present

## 2017-05-13 DIAGNOSIS — Z85820 Personal history of malignant melanoma of skin: Secondary | ICD-10-CM | POA: Diagnosis not present

## 2017-05-13 DIAGNOSIS — Z79899 Other long term (current) drug therapy: Secondary | ICD-10-CM | POA: Diagnosis not present

## 2017-05-13 DIAGNOSIS — R0602 Shortness of breath: Secondary | ICD-10-CM | POA: Diagnosis not present

## 2017-05-13 DIAGNOSIS — M129 Arthropathy, unspecified: Secondary | ICD-10-CM | POA: Diagnosis not present

## 2017-05-13 DIAGNOSIS — R490 Dysphonia: Secondary | ICD-10-CM | POA: Diagnosis not present

## 2017-05-13 DIAGNOSIS — N3281 Overactive bladder: Secondary | ICD-10-CM | POA: Diagnosis not present

## 2017-05-13 DIAGNOSIS — Z7189 Other specified counseling: Secondary | ICD-10-CM | POA: Insufficient documentation

## 2017-05-13 DIAGNOSIS — R200 Anesthesia of skin: Secondary | ICD-10-CM | POA: Diagnosis not present

## 2017-05-13 DIAGNOSIS — R011 Cardiac murmur, unspecified: Secondary | ICD-10-CM | POA: Diagnosis not present

## 2017-05-13 DIAGNOSIS — Z87891 Personal history of nicotine dependence: Secondary | ICD-10-CM | POA: Diagnosis not present

## 2017-05-13 DIAGNOSIS — C787 Secondary malignant neoplasm of liver and intrahepatic bile duct: Secondary | ICD-10-CM | POA: Diagnosis not present

## 2017-05-13 LAB — CEA: CEA1: 36.8 ng/mL — AB (ref 0.0–4.7)

## 2017-05-14 ENCOUNTER — Other Ambulatory Visit: Payer: Self-pay | Admitting: *Deleted

## 2017-05-14 ENCOUNTER — Ambulatory Visit: Payer: Medicare Other

## 2017-05-14 ENCOUNTER — Inpatient Hospital Stay: Payer: Medicare Other | Attending: Hematology and Oncology

## 2017-05-14 ENCOUNTER — Ambulatory Visit
Admission: RE | Admit: 2017-05-14 | Discharge: 2017-05-14 | Disposition: A | Discharge status: Home / Self Care | Payer: Medicare Other | Source: Ambulatory Visit | Attending: Radiation Oncology | Admitting: Radiation Oncology

## 2017-05-14 VITALS — BP 170/70 | HR 60 | Resp 18

## 2017-05-14 DIAGNOSIS — Z85820 Personal history of malignant melanoma of skin: Secondary | ICD-10-CM | POA: Insufficient documentation

## 2017-05-14 DIAGNOSIS — Z9989 Dependence on other enabling machines and devices: Secondary | ICD-10-CM | POA: Insufficient documentation

## 2017-05-14 DIAGNOSIS — K5900 Constipation, unspecified: Secondary | ICD-10-CM | POA: Insufficient documentation

## 2017-05-14 DIAGNOSIS — Z803 Family history of malignant neoplasm of breast: Secondary | ICD-10-CM | POA: Insufficient documentation

## 2017-05-14 DIAGNOSIS — G629 Polyneuropathy, unspecified: Secondary | ICD-10-CM | POA: Insufficient documentation

## 2017-05-14 DIAGNOSIS — M1990 Unspecified osteoarthritis, unspecified site: Secondary | ICD-10-CM | POA: Insufficient documentation

## 2017-05-14 DIAGNOSIS — Z79899 Other long term (current) drug therapy: Secondary | ICD-10-CM | POA: Insufficient documentation

## 2017-05-14 DIAGNOSIS — Z808 Family history of malignant neoplasm of other organs or systems: Secondary | ICD-10-CM | POA: Insufficient documentation

## 2017-05-14 DIAGNOSIS — N3281 Overactive bladder: Secondary | ICD-10-CM | POA: Diagnosis not present

## 2017-05-14 DIAGNOSIS — C155 Malignant neoplasm of lower third of esophagus: Secondary | ICD-10-CM

## 2017-05-14 DIAGNOSIS — R630 Anorexia: Secondary | ICD-10-CM | POA: Insufficient documentation

## 2017-05-14 DIAGNOSIS — D6959 Other secondary thrombocytopenia: Secondary | ICD-10-CM | POA: Insufficient documentation

## 2017-05-14 DIAGNOSIS — T451X5S Adverse effect of antineoplastic and immunosuppressive drugs, sequela: Secondary | ICD-10-CM | POA: Diagnosis not present

## 2017-05-14 DIAGNOSIS — R1013 Epigastric pain: Secondary | ICD-10-CM | POA: Insufficient documentation

## 2017-05-14 DIAGNOSIS — I10 Essential (primary) hypertension: Secondary | ICD-10-CM | POA: Insufficient documentation

## 2017-05-14 DIAGNOSIS — R011 Cardiac murmur, unspecified: Secondary | ICD-10-CM | POA: Diagnosis not present

## 2017-05-14 DIAGNOSIS — R600 Localized edema: Secondary | ICD-10-CM | POA: Diagnosis not present

## 2017-05-14 DIAGNOSIS — J449 Chronic obstructive pulmonary disease, unspecified: Secondary | ICD-10-CM | POA: Insufficient documentation

## 2017-05-14 DIAGNOSIS — Z90710 Acquired absence of both cervix and uterus: Secondary | ICD-10-CM | POA: Insufficient documentation

## 2017-05-14 DIAGNOSIS — D500 Iron deficiency anemia secondary to blood loss (chronic): Secondary | ICD-10-CM | POA: Insufficient documentation

## 2017-05-14 DIAGNOSIS — R634 Abnormal weight loss: Secondary | ICD-10-CM | POA: Diagnosis not present

## 2017-05-14 DIAGNOSIS — Z87891 Personal history of nicotine dependence: Secondary | ICD-10-CM | POA: Insufficient documentation

## 2017-05-14 DIAGNOSIS — G4733 Obstructive sleep apnea (adult) (pediatric): Secondary | ICD-10-CM | POA: Diagnosis not present

## 2017-05-14 DIAGNOSIS — Z801 Family history of malignant neoplasm of trachea, bronchus and lung: Secondary | ICD-10-CM | POA: Insufficient documentation

## 2017-05-14 DIAGNOSIS — Z5111 Encounter for antineoplastic chemotherapy: Secondary | ICD-10-CM | POA: Insufficient documentation

## 2017-05-14 DIAGNOSIS — K769 Liver disease, unspecified: Secondary | ICD-10-CM | POA: Diagnosis not present

## 2017-05-14 DIAGNOSIS — Z87442 Personal history of urinary calculi: Secondary | ICD-10-CM | POA: Diagnosis not present

## 2017-05-14 DIAGNOSIS — Z8052 Family history of malignant neoplasm of bladder: Secondary | ICD-10-CM | POA: Insufficient documentation

## 2017-05-14 DIAGNOSIS — Z8042 Family history of malignant neoplasm of prostate: Secondary | ICD-10-CM | POA: Insufficient documentation

## 2017-05-14 MED ORDER — SODIUM CHLORIDE 0.9% FLUSH
10.0000 mL | INTRAVENOUS | Status: DC | PRN
  Administered 2017-05-14: 10 mL
  Filled 2017-05-14: qty 10

## 2017-05-14 MED ORDER — HEPARIN SOD (PORK) LOCK FLUSH 100 UNIT/ML IV SOLN
500.0000 [IU] | Freq: Once | INTRAVENOUS | Status: AC | PRN
  Administered 2017-05-14: 500 [IU]

## 2017-05-15 ENCOUNTER — Encounter: Payer: Self-pay | Admitting: Oncology

## 2017-05-15 ENCOUNTER — Other Ambulatory Visit: Payer: Self-pay | Admitting: Family Medicine

## 2017-05-15 ENCOUNTER — Ambulatory Visit
Admission: RE | Admit: 2017-05-15 | Discharge: 2017-05-15 | Disposition: A | Discharge status: Home / Self Care | Payer: Medicare Other | Source: Ambulatory Visit | Attending: Radiation Oncology | Admitting: Radiation Oncology

## 2017-05-15 ENCOUNTER — Ambulatory Visit: Payer: Medicare Other

## 2017-05-15 DIAGNOSIS — Z1231 Encounter for screening mammogram for malignant neoplasm of breast: Secondary | ICD-10-CM

## 2017-05-15 DIAGNOSIS — C155 Malignant neoplasm of lower third of esophagus: Secondary | ICD-10-CM | POA: Diagnosis not present

## 2017-05-16 ENCOUNTER — Ambulatory Visit: Payer: Medicare Other

## 2017-05-16 ENCOUNTER — Ambulatory Visit
Admission: RE | Admit: 2017-05-16 | Discharge: 2017-05-16 | Disposition: A | Discharge status: Home / Self Care | Payer: Medicare Other | Source: Ambulatory Visit | Attending: Radiation Oncology | Admitting: Radiation Oncology

## 2017-05-16 DIAGNOSIS — C155 Malignant neoplasm of lower third of esophagus: Secondary | ICD-10-CM | POA: Diagnosis not present

## 2017-05-19 ENCOUNTER — Ambulatory Visit: Payer: Medicare Other

## 2017-05-19 ENCOUNTER — Ambulatory Visit
Admission: RE | Admit: 2017-05-19 | Discharge: 2017-05-19 | Disposition: A | Discharge status: Home / Self Care | Payer: Medicare Other | Source: Ambulatory Visit | Attending: Radiation Oncology | Admitting: Radiation Oncology

## 2017-05-19 ENCOUNTER — Inpatient Hospital Stay: Payer: Medicare Other

## 2017-05-19 DIAGNOSIS — C155 Malignant neoplasm of lower third of esophagus: Secondary | ICD-10-CM | POA: Diagnosis not present

## 2017-05-20 ENCOUNTER — Ambulatory Visit: Payer: Medicare Other

## 2017-05-20 ENCOUNTER — Ambulatory Visit
Admission: RE | Admit: 2017-05-20 | Discharge: 2017-05-20 | Disposition: A | Discharge status: Home / Self Care | Payer: Medicare Other | Source: Ambulatory Visit | Attending: Radiation Oncology | Admitting: Radiation Oncology

## 2017-05-20 DIAGNOSIS — C155 Malignant neoplasm of lower third of esophagus: Secondary | ICD-10-CM | POA: Diagnosis not present

## 2017-05-21 ENCOUNTER — Ambulatory Visit: Payer: Medicare Other

## 2017-05-21 ENCOUNTER — Ambulatory Visit
Admission: RE | Admit: 2017-05-21 | Discharge: 2017-05-21 | Disposition: A | Discharge status: Home / Self Care | Payer: Medicare Other | Source: Ambulatory Visit | Attending: Radiation Oncology | Admitting: Radiation Oncology

## 2017-05-21 DIAGNOSIS — C155 Malignant neoplasm of lower third of esophagus: Secondary | ICD-10-CM | POA: Diagnosis not present

## 2017-05-22 ENCOUNTER — Ambulatory Visit
Admission: RE | Admit: 2017-05-22 | Discharge: 2017-05-22 | Disposition: A | Discharge status: Home / Self Care | Payer: Medicare Other | Source: Ambulatory Visit | Attending: Radiation Oncology | Admitting: Radiation Oncology

## 2017-05-22 ENCOUNTER — Ambulatory Visit: Payer: Medicare Other

## 2017-05-22 DIAGNOSIS — C155 Malignant neoplasm of lower third of esophagus: Secondary | ICD-10-CM | POA: Diagnosis not present

## 2017-05-23 ENCOUNTER — Ambulatory Visit
Admission: RE | Admit: 2017-05-23 | Discharge: 2017-05-23 | Disposition: A | Discharge status: Home / Self Care | Payer: Medicare Other | Source: Ambulatory Visit | Attending: Radiation Oncology | Admitting: Radiation Oncology

## 2017-05-23 ENCOUNTER — Ambulatory Visit: Payer: Medicare Other

## 2017-05-23 DIAGNOSIS — C155 Malignant neoplasm of lower third of esophagus: Secondary | ICD-10-CM | POA: Diagnosis not present

## 2017-05-25 NOTE — Progress Notes (Signed)
Hematology/Oncology Follow Up Note [facility]  [phone] [phone]  Patient Care Team: Tonia Ghent, MD as PCP - General (Family Medicine)   Name of the patient: [name]  [id number]  04-Mar-1935   REASON FOR VISIT Follow up of management of esophageal adenocarcinoma in the management of iron deficiency anemia.  HISTORY OF PRESENT ILLNESS Patient is a 83 year old female with past medical history listed as below who presented with complaints of epigastric discomfort, and sensation of food being stuck. Patient also reports that epigastric pain is aching in nature, and intermittent, for about several months. Associated with weight loss about 20 pounds in the past year. Patient also felt shortness of breath and in the emergency room oxygen saturation will at 83%. CT abdomen showed multiple masses in the liver, unknown etiology. EGD during admission showed partially obstructive esophageal mass at the distal part of esophagus. Biopsy was taken.  Pathology showed esophageal adenocarcinoma. US biopsy of liver lesion to proof distant metastasis.  Report family history of breast cancer, but she has been having routine mammograms. Lives alone. She's a former smoker.  INTERVAL HISTORY Patient presents for evaluation after cycle 4 FOLFOX chemotherapy for esophageal adenocarcinoma. Due to thrombocytopenia and also being on concurrent radiation, she skipped oxaliplatin for her cycle 3 FOLFOX treatment. She reports feeling well. She doesn't have much appetite otherwise doing fine. She denies any diarrhea. She continues to have constipation for which she takes Senokot daily and MiraLAX as needed. She has bowel movement every other day. She has mild nausea but no vomiting. She takes antiemetics as needed which helped.  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 She feels that her neuropathy has not get awaiting worse.    Current Pain Regimen:  Tramadol to 52m Q6h PRN. She uses half tablet sometimes.    Review of systems Review of Systems  Constitutional: Negative for chills, diaphoresis and fever.       Decreased appetite  HENT: Negative for ear pain, hearing loss, nosebleeds and tinnitus.   Eyes: Negative for blurred vision and redness.  Respiratory: Negative for cough and wheezing.   Cardiovascular: Negative for chest pain, palpitations, claudication and PND.  Gastrointestinal: Positive for constipation. Negative for melena, nausea and vomiting.          Genitourinary: Negative for dysuria, flank pain and urgency.  Musculoskeletal: Negative for falls, myalgias and neck pain.  Skin: Negative for itching and rash.  Neurological: Negative for dizziness, tremors and headaches.  Endo/Heme/Allergies: Does not bruise/bleed easily.  Psychiatric/Behavioral: Negative for depression and hallucinations.    Allergies  Allergen Reactions  . Cefuroxime Nausea Only  . Codeine Nausea Only  . Doxycycline Other (See Comments)    Lip swelling  . Gabapentin Other (See Comments)    Swelling.   . Iohexol Itching    07-15-13 pt developed itching on fingers after contrast given. Dr. OIrish Elderslooked at pt and said to put in system as allergy. BB  . Other Other (See Comments)    Contrast Dye- caused fever, chills, awful feeling Bandages - itching, rash  . Oxycodone Other (See Comments)    nausea  . Quinolones Swelling    Lip swelling  . Synvisc [Hylan G-F 20] Swelling     Past Medical History:  Diagnosis Date  . Arthritis   . Asthma   . COPD ([place]ronic obstructive [place] disease) [place]   . Dysphonia   . Dyspnea   . Esophageal cancer (  HCC)   . Heart murmur   . History of kidney stones   . Hypertension   . Melanoma ([place])   . OAB (overactive bladder)   . OSA on CPAP   . 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 Pupil asymmetry    R pupil defect  . Skin cancer   . Sleep apnea   .  Spinal cord abscess      Past Surgical History:  Procedure Laterality Date  . ABDOMINAL HYSTERECTOMY    . ANTERIOR CERVICAL DECOMP/DISCECTOMY FUSION N/A 07/16/2013   Procedure: ANTERIOR CERVICAL DECOMPRESSION/DISCECTOMY FUSION mutlipleLEVELS C4-7;  Surgeon: Erline Levine, MD;  Location: [place] [facility];  Service: Neurosurgery;  Laterality: N/A;  . APPENDECTOMY    . carpel tunn Bilateral   . CATARACT EXTRACTION    . CATARACT EXTRACTION, BILATERAL    . CHOLECYSTECTOMY    . dental implant    . ESOPHAGOGASTRODUODENOSCOPY Left 03/21/2017   Procedure: ESOPHAGOGASTRODUODENOSCOPY (EGD);  Surgeon: Virgel Manifold, MD;  [facility];  Service: Endoscopy;  Laterality: Left;  Marland Kitchen GASTRIC BYPASS    . HIATAL HERNIA REPAIR    . MELANOMA EXCISION    . PORTA CATH INSERTION N/A 04/07/2017   Procedure: PORTA CATH INSERTION;  Surgeon: Algernon Huxley, MD;  Location: [place] CV LAB;  Service: Cardiovascular;  Laterality: N/A;  . POSTERIOR CERVICAL FUSION/FORAMINOTOMY N/A 07/28/2013   Procedure: Cervical four-seven  posterior cervical fusion;  Surgeon: Erline Levine, MD;  Location: [place] NEUR[facility] ORS;  Service: Neurosurgery;  Laterality: N/A;  . TONSILLECTOMY    . TOTAL KNEE ARTHROPLASTY      Social History   Socioeconomic History  . Marital status: Widowed    Spouse name: Not on file  . Number of children: 2  . Years of education: Not on file  . Highest education level: Master's degree (e.g., MA, MS, MEng, MEd, MSW, MBA)  Social Needs  . Financial resource strain: Not hard at all  . Food insecurity - worry: Never true  . Food insecurity - inability: Never true  . Transportation needs - medical: No  . Transportation needs - non-medical: No  Occupational History  . Not on file  Tobacco Use  . Smoking status: Former Smoker    Packs/day: 1.00    Years: 20.00    Pack years: 20.00    Types: Cigarettes    Last attempt to quit: 1977    Years since quitting: 42.0  . Smokeless tobacco: Never  Used  Substance and Sexual Activity  . Alcohol use: No  . Drug use: No  . Sexual activity: No  Other Topics Concern  . Not on file  Social History Narrative   Marital Status- Widowed    Lives by herself    Employement- Retired Pharmacist, hospital   Exercise hx- Does PT     Family History  Problem Relation Age of Onset  . Breast cancer Mother 28  . Arthritis-Osteo Mother   . Breast cancer Sister 41  . Bladder Cancer Sister   . Bladder Cancer Father   . Tongue cancer Father   . Congenital heart disease Father   . Prostate cancer Brother   . Uterine cancer Maternal Aunt   . Lung cancer Paternal Uncle   . Leukemia Maternal Grandmother   . Liver cancer Paternal Grandmother   . Colon cancer Neg Hx      Current Outpatient Medications:  .  acetaminophen (TYLENOL) 325 MG tablet, Take 650 mg by  mouth every 6 (six) hours as needed for moderate pain or headache. , Disp: , Rfl:  .  ALBUTEROL SULFATE HFA IN, Inhale into the lungs., Disp: , Rfl:  .  bisacodyl (DULCOLAX) 10 MG suppository, Place rectally., Disp: , Rfl:  .  calcium carbonate (TUMS EX) 750 MG chewable tablet, Chew by mouth., Disp: , Rfl:  .  Cholecalciferol (VITAMIN D) 2000 UNITS CAPS, Take 2,000 Units by mouth daily., Disp: , Rfl:  .  cyanocobalamin 500 MCG tablet, Take 500 mcg every other day by mouth., Disp: , Rfl:  .  dexamethasone (DECADRON) 4 MG tablet, Take 2 tablets (8 mg total) by mouth daily. Start the day after chemotherapy for 2 days. Take with food. (Patient not taking: Reported on 05/09/2017), Disp: 30 tablet, Rfl: 1 .  diazepam (VALIUM) 2 MG tablet, Take by mouth., Disp: , Rfl:  .  Diclofenac Sodium 1 % CREA, Apply to left arm as needed for pain three times daily, Disp: 120 g, Rfl: 0 .  Ferrous Sulfate 142 (45 Fe) MG TBCR, Take by mouth., Disp: , Rfl:  .  fluticasone (FLONASE) 50 MCG/ACT nasal spray, Place 1-2 sprays into both nostrils daily. , Disp: , Rfl:  .  gabapentin (NEURONTIN) 100 MG capsule, Take by mouth., Disp:  , Rfl:  .  hydrocortisone cream 1 %, Apply 1 application topically daily as needed for itching. , Disp: , Rfl:  .  lidocaine-prilocaine (EMLA) cream, Apply to affected area once, Disp: 30 g, Rfl: 3 .  loperamide (IMODIUM) 2 MG capsule, Take 1 capsule (2 mg total) by mouth See admin instructions. With onset of loose stool, take 44m followed by 249mevery 2 hours until 12 hours have passed without loose bowel movement. Maximum: 16 mg/day, Disp: 120 capsule, Rfl: 0 .  loratadine (CLARITIN) 10 MG tablet, Take 10 mg by mouth daily., Disp: , Rfl:  .  LYRICA 150 MG capsule, TAKE 1 CAPSULE BY MOUTH TWO TIMES DAILY, Disp: 180 capsule, Rfl: 1 .  meloxicam (MOBIC) 7.5 MG tablet, Take by mouth., Disp: , Rfl:  .  metoprolol succinate (TOPROL-XL) 50 MG 24 hr tablet, TAKE 1 TABLET BY MOUTH DAILY, Disp: 90 tablet, Rfl: 3 .  omeprazole (PRILOSEC) 40 MG capsule, Take 1 capsule (40 mg total) by mouth daily. (Patient not taking: Reported on 05/09/2017), Disp: 30 capsule, Rfl: 0 .  ondansetron (ZOFRAN) 8 MG tablet, Take 1 tablet (8 mg total) by mouth 2 (two) times daily as needed for refractory nausea / vomiting. Start on day 3 after chemotherapy., Disp: 30 tablet, Rfl: 1 .  oxybutynin (DITROPAN) 5 MG tablet, TAKE 1/2 TABLET BY MOUTH IN THE MORNING AND 1 TABLET AT BEDTIME, Disp: , Rfl:  .  oxyCODONE-acetaminophen (PERCOCET/ROXICET) 5-325 MG tablet, Take by mouth., Disp: , Rfl:  .  Pediatric Multiple Vitamins (FLINTSTONES MULTIVITAMIN PO), Take by mouth., Disp: , Rfl:  .  Polyethyl Glycol-Propyl Glycol (SYSTANE OP), Apply 1 drop to eye daily as needed (dry eyes)., Disp: , Rfl:  .  prochlorperazine (COMPAZINE) 10 MG tablet, Take 1 tablet (10 mg total) by mouth every 6 (six) hours as needed (Nausea or vomiting)., Disp: 30 tablet, Rfl: 1 .  pyridOXINE (VITAMIN B-6) 100 MG tablet, Take 1 tablet (100 mg total) by mouth daily., Disp: 30 tablet, Rfl: 3 .  senna-docusate (SENOKOT-S) 8.6-50 MG tablet, Take 1 tablet at bedtime as  needed by mouth for mild constipation. (Patient taking differently: Take 1 tablet by mouth at bedtime as needed for mild  constipation. ), Disp: , Rfl:  .  sucralfate (CARAFATE) 1 g tablet, Take 1 tablet (1 g total) by mouth 3 (three) times daily before meals., Disp: 90 tablet, Rfl: 4 .  traMADol (ULTRAM) 50 MG tablet, Take 1 tablet (50 mg total) by mouth 3 (three) times daily as needed for moderate pain., Disp: 30 tablet, Rfl: 1 .  traZODone (DESYREL) 50 MG tablet, TAKE 1/2 TABLET BY MOUTH AT BEDTIME IF NEEDED FOR SLEEP, Disp: 45 tablet, Rfl: 3  Physical exam:  Vitals:   05/26/17 1000  BP: 117/79  Pulse: 91  Temp: 97.6 F (36.4 C)  TempSrc: Tympanic  Weight: 224 lb 3 oz (101.7 kg)   Physical Exam  Constitutional: She is oriented to person, place, and time and well-developed, well-nourished, and in no distress. No distress.  HENT:  Head: Normocephalic and atraumatic.  Mouth/Throat: No oropharyngeal exudate.  Eyes: Conjunctivae and EOM are normal. Pupils are equal, round, and reactive to light. No scleral icterus.  Neck: Normal range of motion. Neck supple.  Cardiovascular: Normal rate and regular rhythm.  Murmur heard. Pulmonary/Chest: Effort normal and breath sounds normal. She has no wheezes.  Abdominal: Soft. Bowel sounds are normal. She exhibits no distension. There is no tenderness.  Musculoskeletal: Normal range of motion. She exhibits no edema or deformity.  Lymphadenopathy:    She has no cervical adenopathy.  Neurological: She is alert and oriented to person, place, and time. She displays normal reflexes. No cranial nerve deficit.  Skin: Skin is warm and dry. No rash noted. No erythema.  Psychiatric: Affect normal.    CMP Latest Ref Rng & Units 05/12/2017  Glucose 65 - 99 mg/dL 145(H)  BUN 6 - 20 mg/dL 14  Creatinine 0.44 - 1.00 mg/dL 0.82  Sodium 135 - 145 mmol/L 139  Potassium 3.5 - 5.1 mmol/L 4.2  Chloride 101 - 111 mmol/L 101  CO2 22 - 32 mmol/L 29  Calcium 8.9 -  10.3 mg/dL 9.0  Total Protein 6.5 - 8.1 g/dL 6.7  Total Bilirubin 0.3 - 1.2 mg/dL 0.5  Alkaline Phos 38 - 126 U/L 62  AST 15 - 41 U/L 34  ALT 14 - 54 U/L 20   CBC Latest Ref Rng & Units 05/12/2017  WBC 3.6 - 11.0 K/uL 3.6  Hemoglobin 12.0 - 16.0 g/dL 12.0  Hematocrit 35.0 - 47.0 % 37.4  Platelets 150 - 440 K/uL 103(L)   Molecular Testing:  #[facility] Cdx: No reportable alternations with companion diagnostic (CDx) claims.  MS stable Tumor mutation burden: Cannot be determined CCND3 amplification: Equivocal.  Detailed report was scanned to [employer].   # HER 2 negative.   Assessment and plan  Cancer Staging Malignant neoplasm of lower third of esophagus [facility]) Staging form: Esophagus - Adenocarcinoma, AJCC 8th Edition - Clinical stage from 04/10/2017: Stage IVB (cTX, cNX, pM1) - Signed by Earlie Server, MD on 04/10/2017  1. Encounter for antineoplastic chemotherapy   2. Iron deficiency anemia due to chronic blood loss   3. Malignant neoplasm of lower third of esophagus (HCC)   # Proceed with cycle 4 FOLFOX, she has grade 1 thrombocytopenia secondary to chemotherapy. We'll proceed with both 5-FU and oxaliplatin. Repeat CBC in 1 week. Plan repeat image after 6 cycles of FOLFOX. # Iron deficiency anemia: s/p 4 doses of IV venofer. Ferritin has improved. Hold additional IV iron.  She has been started Radiation. Continue follow up with RadOnc for RT. # Pre-existing peripheral neuropathy: continue Lyrica, Oxaliplatin dose were reduced to  49m/m2. Patient reports neuropathy at baseline continue monitor. Continue B6 supplementation.   Follow up in 2 week prior to cycle 5 of FOLFOX.   ZEarlie Server MD, PhD Hematology Oncology CMer[facility] Independenceat APip[facility] Med [facility] & [place] CcPager- [phone]

## 2017-05-26 ENCOUNTER — Ambulatory Visit: Payer: Medicare Other

## 2017-05-26 ENCOUNTER — Inpatient Hospital Stay: Payer: Medicare Other

## 2017-05-26 ENCOUNTER — Other Ambulatory Visit: Payer: Medicare Other

## 2017-05-26 ENCOUNTER — Encounter: Payer: Self-pay | Admitting: Oncology

## 2017-05-26 ENCOUNTER — Inpatient Hospital Stay: Payer: Medicare Other | Admitting: Oncology

## 2017-05-26 ENCOUNTER — Ambulatory Visit
Admission: RE | Admit: 2017-05-26 | Discharge: 2017-05-26 | Disposition: A | Discharge status: Home / Self Care | Payer: Medicare Other | Source: Ambulatory Visit | Attending: Radiation Oncology | Admitting: Radiation Oncology

## 2017-05-26 ENCOUNTER — Other Ambulatory Visit: Payer: Self-pay

## 2017-05-26 ENCOUNTER — Ambulatory Visit: Payer: Medicare Other | Admitting: Hematology and Oncology

## 2017-05-26 ENCOUNTER — Inpatient Hospital Stay: Payer: Medicare Other | Admitting: *Deleted

## 2017-05-26 VITALS — BP 117/79 | HR 91 | Temp 97.6°F | Wt 224.2 lb

## 2017-05-26 DIAGNOSIS — K769 Liver disease, unspecified: Secondary | ICD-10-CM | POA: Diagnosis not present

## 2017-05-26 DIAGNOSIS — J449 Chronic obstructive pulmonary disease, unspecified: Secondary | ICD-10-CM

## 2017-05-26 DIAGNOSIS — D6959 Other secondary thrombocytopenia: Secondary | ICD-10-CM

## 2017-05-26 DIAGNOSIS — T451X5S Adverse effect of antineoplastic and immunosuppressive drugs, sequela: Secondary | ICD-10-CM

## 2017-05-26 DIAGNOSIS — G629 Polyneuropathy, unspecified: Secondary | ICD-10-CM | POA: Diagnosis not present

## 2017-05-26 DIAGNOSIS — C155 Malignant neoplasm of lower third of esophagus: Secondary | ICD-10-CM

## 2017-05-26 DIAGNOSIS — Z803 Family history of malignant neoplasm of breast: Secondary | ICD-10-CM

## 2017-05-26 DIAGNOSIS — Z801 Family history of malignant neoplasm of trachea, bronchus and lung: Secondary | ICD-10-CM

## 2017-05-26 DIAGNOSIS — Z85820 Personal history of malignant melanoma of skin: Secondary | ICD-10-CM

## 2017-05-26 DIAGNOSIS — R634 Abnormal weight loss: Secondary | ICD-10-CM | POA: Diagnosis not present

## 2017-05-26 DIAGNOSIS — Z5111 Encounter for antineoplastic chemotherapy: Secondary | ICD-10-CM

## 2017-05-26 DIAGNOSIS — G4733 Obstructive sleep apnea (adult) (pediatric): Secondary | ICD-10-CM | POA: Diagnosis not present

## 2017-05-26 DIAGNOSIS — D500 Iron deficiency anemia secondary to blood loss (chronic): Secondary | ICD-10-CM

## 2017-05-26 DIAGNOSIS — Z87891 Personal history of nicotine dependence: Secondary | ICD-10-CM

## 2017-05-26 DIAGNOSIS — Z8042 Family history of malignant neoplasm of prostate: Secondary | ICD-10-CM

## 2017-05-26 DIAGNOSIS — M1990 Unspecified osteoarthritis, unspecified site: Secondary | ICD-10-CM

## 2017-05-26 DIAGNOSIS — Z79899 Other long term (current) drug therapy: Secondary | ICD-10-CM

## 2017-05-26 DIAGNOSIS — Z9989 Dependence on other enabling machines and devices: Secondary | ICD-10-CM

## 2017-05-26 DIAGNOSIS — Z808 Family history of malignant neoplasm of other organs or systems: Secondary | ICD-10-CM

## 2017-05-26 DIAGNOSIS — K5900 Constipation, unspecified: Secondary | ICD-10-CM

## 2017-05-26 DIAGNOSIS — Z8052 Family history of malignant neoplasm of bladder: Secondary | ICD-10-CM

## 2017-05-26 DIAGNOSIS — Z95828 Presence of other vascular implants and grafts: Secondary | ICD-10-CM

## 2017-05-26 DIAGNOSIS — R630 Anorexia: Secondary | ICD-10-CM | POA: Diagnosis not present

## 2017-05-26 LAB — CBC WITH DIFFERENTIAL/PLATELET
Basophils Absolute: 0 10*3/uL (ref 0–0.1)
Basophils Relative: 0 %
Eosinophils Absolute: 0.2 10*3/uL (ref 0–0.7)
Eosinophils Relative: 6 %
HCT: 40.4 % (ref 35.0–47.0)
Hemoglobin: 13.1 g/dL (ref 12.0–16.0)
Lymphocytes Relative: 9 %
Lymphs Abs: 0.4 10*3/uL — ABNORMAL LOW (ref 1.0–3.6)
MCH: 30.1 pg (ref 26.0–34.0)
MCHC: 32.5 g/dL (ref 32.0–36.0)
MCV: 92.6 fL (ref 80.0–100.0)
Monocytes Absolute: 0.5 10*3/uL (ref 0.2–0.9)
Monocytes Relative: 12 %
Neutro Abs: 3.1 10*3/uL (ref 1.4–6.5)
Neutrophils Relative %: 73 %
Platelets: 118 10*3/uL — ABNORMAL LOW (ref 150–440)
RBC: 4.36 MIL/uL (ref 3.80–5.20)
RDW: 16.7 % — ABNORMAL HIGH (ref 11.5–14.5)
WBC: 4.3 10*3/uL (ref 3.6–11.0)

## 2017-05-26 LAB — COMPREHENSIVE METABOLIC PANEL
ALT: 17 U/L (ref 14–54)
AST: 34 U/L (ref 15–41)
Albumin: 3.6 g/dL (ref 3.5–5.0)
Alkaline Phosphatase: 57 U/L (ref 38–126)
Anion gap: 10 (ref 5–15)
BUN: 13 mg/dL (ref 6–20)
CO2: 28 mmol/L (ref 22–32)
Calcium: 9.1 mg/dL (ref 8.9–10.3)
Chloride: 101 mmol/L (ref 101–111)
Creatinine, Ser: 0.74 mg/dL (ref 0.44–1.00)
GFR calc Af Amer: >60 mL/min (ref >60)
GFR calc non Af Amer: >60 mL/min (ref >60)
Glucose, Bld: 121 mg/dL — ABNORMAL HIGH (ref 65–99)
Potassium: 4 mmol/L (ref 3.5–5.1)
Sodium: 139 mmol/L (ref 135–145)
Total Bilirubin: 0.6 mg/dL (ref 0.3–1.2)
Total Protein: 6.6 g/dL (ref 6.5–8.1)

## 2017-05-26 LAB — MAGNESIUM: Magnesium: 1.7 mg/dL (ref 1.7–2.4)

## 2017-05-26 MED ORDER — SODIUM CHLORIDE 0.9% FLUSH
10.0000 mL | INTRAVENOUS | Status: AC | PRN
  Administered 2017-05-26: 10 mL
  Filled 2017-05-26: qty 10

## 2017-05-26 MED ORDER — LEUCOVORIN CALCIUM INJECTION 100 MG: 20.0000 mg/m2 | Freq: Once | INTRAMUSCULAR | Status: DC

## 2017-05-26 MED ORDER — DEXTROSE 5 % IV SOLN
Freq: Once | INTRAVENOUS | Status: AC
  Administered 2017-05-26: 11:00:00 via INTRAVENOUS
  Filled 2017-05-26: qty 1000

## 2017-05-26 MED ORDER — OXALIPLATIN CHEMO INJECTION 100 MG/20ML
65.0000 mg/m2 | Freq: Once | INTRAVENOUS | Status: AC
  Administered 2017-05-26: 140 mg via INTRAVENOUS
  Filled 2017-05-26: qty 10

## 2017-05-26 MED ORDER — SODIUM CHLORIDE 0.9 % IV SOLN
2400.0000 mg/m2 | INTRAVENOUS | Status: DC
  Administered 2017-05-26: 5200 mg via INTRAVENOUS
  Filled 2017-05-26: qty 104

## 2017-05-26 MED ORDER — LEUCOVORIN CALCIUM INJECTION 350 MG
850.0000 mg | Freq: Once | INTRAVENOUS | Status: AC
  Administered 2017-05-26: 850 mg via INTRAVENOUS
  Filled 2017-05-26: qty 42.5

## 2017-05-26 MED ORDER — FLUOROURACIL CHEMO INJECTION 2.5 GM/50ML
400.0000 mg/m2 | Freq: Once | INTRAVENOUS | Status: AC
  Administered 2017-05-26: 850 mg via INTRAVENOUS
  Filled 2017-05-26: qty 17

## 2017-05-26 MED ORDER — DEXAMETHASONE SODIUM PHOSPHATE 10 MG/ML IJ SOLN
10.0000 mg | Freq: Once | INTRAMUSCULAR | Status: AC
  Administered 2017-05-26: 10 mg via INTRAVENOUS
  Filled 2017-05-26: qty 1

## 2017-05-26 MED ORDER — PALONOSETRON HCL INJECTION 0.25 MG/5ML
0.2500 mg | Freq: Once | INTRAVENOUS | Status: AC
  Administered 2017-05-26: 0.25 mg via INTRAVENOUS
  Filled 2017-05-26: qty 5

## 2017-05-26 MED ORDER — HEPARIN SOD (PORK) LOCK FLUSH 100 UNIT/ML IV SOLN: 500.0000 [IU] | INTRAVENOUS | Status: DC | PRN

## 2017-05-26 NOTE — Progress Notes (Signed)
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  Patient receiving chemotherapy and radiation therapy.    Met with patient and sister during infusion today.  Patient reports that she is still eating small frequent meals. She is trying to work on getting adequate protein everyday.  Reports can't eat eggs anymore as can't stand the taste.  Reports ate grilled pork chop, green beans, fried apples and biscuit at [employer] last night (about 1/2 of everything).  Made a oral nutrition shake using orange juice and protein powder.  Reports some nausea but medications helps.  Also reports some constipation that she is working on (taking miralax and senokot)   Medications: reviewed  Labs: reviewed  Anthropometrics:   Weight decreased to 224 lb today from 229 lb 9 oz on 12/17.  UBW of 257 lb (last year)  3% weight loss in the last 1 1/2 months  NUTRITION DIAGNOSIS: Inadequate oral intake improving   MALNUTRITION DIAGNOSIS: continue to monitor   INTERVENTION:   Patient has not tried clear liquid nutrition shakes.  Discussed pronourish shakes as has not been drinking them either at this time.  Encouraged patient to drink 1-2 per day. Encouraged small frequent meals Encouraged good sources of protein at every meal/snack Encouraged good sources of fluid to help with constipation    MONITORING, EVALUATION, GOAL: weight trends, intake    NEXT VISIT: Jan 28 during infusion  Epimenio Schetter B. Zenia Resides, [place], [place] Registered Dietitian [phone] (pager)

## 2017-05-26 NOTE — Progress Notes (Signed)
Patient here today for follow up.  Patient states no new concerns today  

## 2017-05-27 ENCOUNTER — Ambulatory Visit: Payer: Medicare Other

## 2017-05-27 ENCOUNTER — Ambulatory Visit
Admission: RE | Admit: 2017-05-27 | Discharge: 2017-05-27 | Disposition: A | Discharge status: Home / Self Care | Payer: Medicare Other | Source: Ambulatory Visit | Attending: Radiation Oncology | Admitting: Radiation Oncology

## 2017-05-27 DIAGNOSIS — C155 Malignant neoplasm of lower third of esophagus: Secondary | ICD-10-CM | POA: Diagnosis not present

## 2017-05-28 ENCOUNTER — Ambulatory Visit: Payer: Medicare Other

## 2017-05-28 ENCOUNTER — Ambulatory Visit
Admission: RE | Admit: 2017-05-28 | Discharge: 2017-05-28 | Disposition: A | Discharge status: Home / Self Care | Payer: Medicare Other | Source: Ambulatory Visit | Attending: Radiation Oncology | Admitting: Radiation Oncology

## 2017-05-28 ENCOUNTER — Inpatient Hospital Stay: Payer: Medicare Other

## 2017-05-28 VITALS — BP 153/67 | HR 60 | Temp 98.6°F

## 2017-05-28 DIAGNOSIS — Z5111 Encounter for antineoplastic chemotherapy: Secondary | ICD-10-CM | POA: Diagnosis not present

## 2017-05-28 DIAGNOSIS — C155 Malignant neoplasm of lower third of esophagus: Secondary | ICD-10-CM

## 2017-05-28 MED ORDER — HEPARIN SOD (PORK) LOCK FLUSH 100 UNIT/ML IV SOLN
500.0000 [IU] | Freq: Once | INTRAVENOUS | Status: AC | PRN
  Administered 2017-05-28: 500 [IU]
  Filled 2017-05-28: qty 5

## 2017-05-28 MED ORDER — SODIUM CHLORIDE 0.9% FLUSH
10.0000 mL | INTRAVENOUS | Status: DC | PRN
  Administered 2017-05-28: 10 mL
  Filled 2017-05-28: qty 10

## 2017-05-29 ENCOUNTER — Ambulatory Visit
Admission: RE | Admit: 2017-05-29 | Discharge: 2017-05-29 | Disposition: A | Discharge status: Home / Self Care | Payer: Medicare Other | Source: Ambulatory Visit | Attending: Radiation Oncology | Admitting: Radiation Oncology

## 2017-05-29 ENCOUNTER — Ambulatory Visit: Payer: Medicare Other

## 2017-05-29 DIAGNOSIS — C155 Malignant neoplasm of lower third of esophagus: Secondary | ICD-10-CM | POA: Diagnosis not present

## 2017-05-30 ENCOUNTER — Ambulatory Visit: Payer: Medicare Other

## 2017-05-30 ENCOUNTER — Ambulatory Visit
Admission: RE | Admit: 2017-05-30 | Discharge: 2017-05-30 | Disposition: A | Discharge status: Home / Self Care | Payer: Medicare Other | Source: Ambulatory Visit | Attending: Radiation Oncology | Admitting: Radiation Oncology

## 2017-05-30 DIAGNOSIS — C155 Malignant neoplasm of lower third of esophagus: Secondary | ICD-10-CM | POA: Diagnosis not present

## 2017-06-02 ENCOUNTER — Inpatient Hospital Stay: Payer: Medicare Other

## 2017-06-02 ENCOUNTER — Ambulatory Visit: Payer: Medicare Other

## 2017-06-02 ENCOUNTER — Ambulatory Visit
Admission: RE | Admit: 2017-06-02 | Discharge: 2017-06-02 | Disposition: A | Discharge status: Home / Self Care | Payer: Medicare Other | Source: Ambulatory Visit | Attending: Radiation Oncology | Admitting: Radiation Oncology

## 2017-06-02 DIAGNOSIS — C155 Malignant neoplasm of lower third of esophagus: Secondary | ICD-10-CM

## 2017-06-02 DIAGNOSIS — Z5111 Encounter for antineoplastic chemotherapy: Secondary | ICD-10-CM | POA: Diagnosis not present

## 2017-06-02 LAB — CBC WITH DIFFERENTIAL/PLATELET
Basophils Absolute: 0 10*3/uL (ref 0–0.1)
Basophils Relative: 0 %
Eosinophils Absolute: 0.6 10*3/uL (ref 0–0.7)
Eosinophils Relative: 16 %
HEMATOCRIT: 40.3 % (ref 35.0–47.0)
Hemoglobin: 12.9 g/dL (ref 12.0–16.0)
LYMPHS ABS: 0.4 10*3/uL — AB (ref 1.0–3.6)
Lymphocytes Relative: 10 %
MCH: 29.7 pg (ref 26.0–34.0)
MCHC: 32 g/dL (ref 32.0–36.0)
MCV: 92.9 fL (ref 80.0–100.0)
MONO ABS: 0.3 10*3/uL (ref 0.2–0.9)
MONOS PCT: 8 %
NEUTROS ABS: 2.4 10*3/uL (ref 1.4–6.5)
Neutrophils Relative %: 66 %
Platelets: 117 10*3/uL — ABNORMAL LOW (ref 150–440)
RBC: 4.34 MIL/uL (ref 3.80–5.20)
RDW: 16.5 % — AB (ref 11.5–14.5)
WBC: 3.6 10*3/uL (ref 3.6–11.0)

## 2017-06-02 LAB — COMPREHENSIVE METABOLIC PANEL
ALK PHOS: 57 U/L (ref 38–126)
ALT: 20 U/L (ref 14–54)
AST: 32 U/L (ref 15–41)
Albumin: 3.5 g/dL (ref 3.5–5.0)
Anion gap: 8 (ref 5–15)
BILIRUBIN TOTAL: 0.5 mg/dL (ref 0.3–1.2)
BUN: 12 mg/dL (ref 6–20)
CALCIUM: 8.7 mg/dL — AB (ref 8.9–10.3)
CO2: 30 mmol/L (ref 22–32)
Chloride: 101 mmol/L (ref 101–111)
Creatinine, Ser: 0.65 mg/dL (ref 0.44–1.00)
GFR calc Af Amer: >60 mL/min (ref >60)
Glucose, Bld: 98 mg/dL (ref 65–99)
POTASSIUM: 4.1 mmol/L (ref 3.5–5.1)
Sodium: 139 mmol/L (ref 135–145)
TOTAL PROTEIN: 6.5 g/dL (ref 6.5–8.1)

## 2017-06-03 ENCOUNTER — Ambulatory Visit
Admission: RE | Admit: 2017-06-03 | Discharge: 2017-06-03 | Disposition: A | Discharge status: Home / Self Care | Payer: Medicare Other | Source: Ambulatory Visit | Attending: Radiation Oncology | Admitting: Radiation Oncology

## 2017-06-03 ENCOUNTER — Ambulatory Visit: Payer: Medicare Other

## 2017-06-03 ENCOUNTER — Ambulatory Visit
Admission: RE | Admit: 2017-06-03 | Discharge: 2017-06-03 | Disposition: A | Discharge status: Home / Self Care | Payer: Medicare Other | Source: Ambulatory Visit | Attending: Family Medicine | Admitting: Family Medicine

## 2017-06-03 DIAGNOSIS — Z1231 Encounter for screening mammogram for malignant neoplasm of breast: Secondary | ICD-10-CM | POA: Insufficient documentation

## 2017-06-03 DIAGNOSIS — C155 Malignant neoplasm of lower third of esophagus: Secondary | ICD-10-CM | POA: Diagnosis not present

## 2017-06-03 HISTORY — DX: Personal history of antineoplastic chemotherapy: Z92.21

## 2017-06-03 HISTORY — DX: Personal history of irradiation: Z92.3

## 2017-06-03 LAB — CEA: CEA1: 11.1 ng/mL — AB (ref 0.0–4.7)

## 2017-06-04 ENCOUNTER — Ambulatory Visit
Admission: RE | Admit: 2017-06-04 | Discharge: 2017-06-04 | Disposition: A | Discharge status: Home / Self Care | Payer: Medicare Other | Source: Ambulatory Visit | Attending: Radiation Oncology | Admitting: Radiation Oncology

## 2017-06-04 ENCOUNTER — Ambulatory Visit: Payer: Medicare Other

## 2017-06-04 DIAGNOSIS — C155 Malignant neoplasm of lower third of esophagus: Secondary | ICD-10-CM | POA: Diagnosis not present

## 2017-06-05 ENCOUNTER — Ambulatory Visit
Admission: RE | Admit: 2017-06-05 | Discharge: 2017-06-05 | Disposition: A | Discharge status: Home / Self Care | Payer: Medicare Other | Source: Ambulatory Visit | Attending: Radiation Oncology | Admitting: Radiation Oncology

## 2017-06-05 ENCOUNTER — Encounter: Payer: Self-pay | Admitting: *Deleted

## 2017-06-05 DIAGNOSIS — C155 Malignant neoplasm of lower third of esophagus: Secondary | ICD-10-CM | POA: Diagnosis not present

## 2017-06-06 ENCOUNTER — Ambulatory Visit: Payer: Medicare Other

## 2017-06-06 ENCOUNTER — Ambulatory Visit
Admission: RE | Admit: 2017-06-06 | Discharge: 2017-06-06 | Disposition: A | Discharge status: Home / Self Care | Payer: Medicare Other | Source: Ambulatory Visit | Attending: Radiation Oncology | Admitting: Radiation Oncology

## 2017-06-06 DIAGNOSIS — C155 Malignant neoplasm of lower third of esophagus: Secondary | ICD-10-CM | POA: Diagnosis not present

## 2017-06-08 NOTE — Progress Notes (Signed)
Hematology/Oncology Follow Up Note [facility]  Telephone:([phone] [phone]  Patient Care Team: Tonia Ghent, MD as PCP - General (Family Medicine)   Name of the patient: [name]  [id number]  01-29-1935   REASON FOR VISIT Follow up of management of esophageal adenocarcinoma in the management of iron deficiency anemia.  HISTORY OF PRESENT ILLNESS Patient is a 83 year old female with past medical history listed as below who presented with complaints of epigastric discomfort, and sensation of food being stuck. Patient also reports that epigastric pain is aching in nature, and intermittent, for about several months. Associated with weight loss about 20 pounds in the past year. Patient also felt shortness of breath and in the emergency room oxygen saturation will at 83%. CT abdomen showed multiple masses in the liver, unknown etiology. EGD during admission showed partially obstructive esophageal mass at the distal part of esophagus. Biopsy was taken.  Pathology showed esophageal adenocarcinoma. US biopsy of liver lesion to proof distant metastasis.  Report family history of breast cancer, but she has been having routine mammograms. Lives alone. She's a former smoker.  INTERVAL HISTORY Patient presents for evaluation after cycle 5  FOLFOX chemotherapy for esophageal adenocarcinoma.  Patient reports feeling well.  Denies any nausea vomiting.  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  She reports that her numbness and tingling has not become much worse since the start of chemotherapy.  Her appetite is fair.  Current Pain Regimen:  Tramadol to 80m Q6h PRN. She uses half tablet sometimes.    Review of systems Review of Systems  Constitutional: Negative for chills, diaphoresis and fever.       Decreased appetite  HENT: Negative for hearing loss.   Eyes: Negative for blurred vision.  Respiratory: Negative  for cough and wheezing.   Cardiovascular: Positive for leg swelling. Negative for chest pain and claudication.  Gastrointestinal: Negative for constipation, heartburn, melena, nausea and vomiting.          Genitourinary: Negative for dysuria, flank pain and urgency.  Musculoskeletal: Negative for myalgias and neck pain.  Skin: Negative for rash.  Neurological: Negative for dizziness.  Endo/Heme/Allergies: Does not bruise/bleed easily.  Psychiatric/Behavioral: The patient is not nervous/anxious.     Allergies  Allergen Reactions  . Cefuroxime Nausea Only  . Codeine Nausea Only  . Doxycycline Other (See Comments)    Lip swelling  . Gabapentin Other (See Comments)    Swelling.   . Iohexol Itching    07-15-13 pt developed itching on fingers after contrast given. Dr. OIrish Elderslooked at pt and said to put in system as allergy. BB  . Other Other (See Comments)    Contrast Dye- caused fever, chills, awful feeling Bandages - itching, rash  . Oxycodone Other (See Comments)    nausea  . Quinolones Swelling    Lip swelling  . Synvisc [Hylan G-F 20] Swelling     Past Medical History:  Diagnosis Date  . Arthritis   . Asthma   . COPD (chronic obstructive pulmonary disease) ([place]   . Dysphonia   . Dyspnea   . Esophageal cancer ([facility]   . Heart murmur   . History of kidney stones   . Hypertension   . Melanoma ([facility]   . OAB (overactive bladder)   . OSA on CPAP   . 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 Personal history of chemotherapy    esophageal cancer  .  Personal history of radiation therapy    esophageal cancer  . Pupil asymmetry    R pupil defect  . Skin cancer   . Sleep apnea   . Spinal cord abscess      Past Surgical History:  Procedure Laterality Date  . ABDOMINAL HYSTERECTOMY    . ANTERIOR CERVICAL DECOMP/DISCECTOMY FUSION N/A 07/16/2013   Procedure: ANTERIOR CERVICAL DECOMPRESSION/DISCECTOMY FUSION mutlipleLEVELS C4-7;  Surgeon: Erline Levine, MD;   Location: [place] [facility];  Service: Neurosurgery;  Laterality: N/A;  . APPENDECTOMY    . carpel tunn Bilateral   . CATARACT EXTRACTION    . CATARACT EXTRACTION, BILATERAL    . CHOLECYSTECTOMY    . dental implant    . ESOPHAGOGASTRODUODENOSCOPY Left 03/21/2017   Procedure: ESOPHAGOGASTRODUODENOSCOPY (EGD);  Surgeon: Virgel Manifold, MD;  Location[facility];  Service: Endoscopy;  Laterality: Left;  Marland Kitchen GASTRIC BYPASS    . HIATAL HERNIA REPAIR    . MELANOMA EXCISION    . PORTA CATH INSERTION N/A 04/07/2017   Procedure: PORTA CATH INSERTION;  Surgeon: Algernon Huxley, MD;  Location: [place] CV LAB;  Service: Cardiovascular;  Laterality: N/A;  . POSTERIOR CERVICAL FUSION/FORAMINOTOMY N/A 07/28/2013   Procedure: Cervical four-seven  posterior cervical fusion;  Surgeon: Erline Levine, MD;  Location: [place] [facility] ORS;  Service: Neurosurgery;  Laterality: N/A;  . TONSILLECTOMY    . TOTAL KNEE ARTHROPLASTY      Social History   Socioeconomic History  . Marital status: Widowed    Spouse name: Not on file  . Number of children: 2  . Years of education: Not on file  . Highest education level: Master's degree (e.g., MA, MS, MEng, MEd, MSW, MBA)  Social Needs  . Financial resource strain: Not hard at all  . Food insecurity - worry: Never true  . Food insecurity - inability: Never true  . Transportation needs - medical: No  . Transportation needs - non-medical: No  Occupational History  . Not on file  Tobacco Use  . Smoking status: Former Smoker    Packs/day: 1.00    Years: 20.00    Pack years: 20.00    Types: Cigarettes    Last attempt to quit: 1977    Years since quitting: 42.1  . Smokeless tobacco: Never Used  Substance and Sexual Activity  . Alcohol use: No  . Drug use: No  . Sexual activity: No  Other Topics Concern  . Not on file  Social History Narrative   Marital Status- Widowed    Lives by herself    Employement- Retired Pharmacist, hospital   Exercise hx- Does PT     Family  History  Problem Relation Age of Onset  . Breast cancer Mother 96  . Arthritis-Osteo Mother   . Breast cancer Sister 74  . Bladder Cancer Sister   . Bladder Cancer Father   . Tongue cancer Father   . Congenital heart disease Father   . Prostate cancer Brother   . Uterine cancer Maternal Aunt   . Lung cancer Paternal Uncle   . Leukemia Maternal Grandmother   . Liver cancer Paternal Grandmother   . Colon cancer Neg Hx      Current Outpatient Medications:  .  acetaminophen (TYLENOL) 325 MG tablet, Take 650 mg by mouth every 6 (six) hours as needed for moderate pain or headache. , Disp: , Rfl:  .  ALBUTEROL SULFATE HFA IN, Inhale into the lungs., Disp: , Rfl:  .  bisacodyl (DULCOLAX) 10 MG  suppository, Place rectally., Disp: , Rfl:  .  calcium carbonate (TUMS EX) 750 MG chewable tablet, Chew by mouth., Disp: , Rfl:  .  Cholecalciferol (VITAMIN D) 2000 UNITS CAPS, Take 2,000 Units by mouth daily., Disp: , Rfl:  .  cyanocobalamin 500 MCG tablet, Take 500 mcg every other day by mouth., Disp: , Rfl:  .  dexamethasone (DECADRON) 4 MG tablet, Take 2 tablets (8 mg total) by mouth daily. Start the day after chemotherapy for 2 days. Take with food., Disp: 30 tablet, Rfl: 1 .  Diclofenac Sodium 1 % CREA, Apply to left arm as needed for pain three times daily, Disp: 120 g, Rfl: 0 .  fluticasone (FLONASE) 50 MCG/ACT nasal spray, Place 1-2 sprays into both nostrils daily. , Disp: , Rfl:  .  hydrocortisone cream 1 %, Apply 1 application topically daily as needed for itching. , Disp: , Rfl:  .  lidocaine-prilocaine (EMLA) cream, Apply to affected area once, Disp: 30 g, Rfl: 3 .  loperamide (IMODIUM) 2 MG capsule, Take 1 capsule (2 mg total) by mouth See admin instructions. With onset of loose stool, take 36m followed by 226mevery 2 hours until 12 hours have passed without loose bowel movement. Maximum: 16 mg/day, Disp: 120 capsule, Rfl: 0 .  loratadine (CLARITIN) 10 MG tablet, Take 10 mg by mouth daily.,  Disp: , Rfl:  .  LYRICA 150 MG capsule, TAKE 1 CAPSULE BY MOUTH TWO TIMES DAILY, Disp: 180 capsule, Rfl: 1 .  metoprolol succinate (TOPROL-XL) 50 MG 24 hr tablet, TAKE 1 TABLET BY MOUTH DAILY, Disp: 90 tablet, Rfl: 3 .  ondansetron (ZOFRAN) 8 MG tablet, Take 1 tablet (8 mg total) by mouth 2 (two) times daily as needed for refractory nausea / vomiting. Start on day 3 after chemotherapy., Disp: 30 tablet, Rfl: 1 .  oxybutynin (DITROPAN) 5 MG tablet, TAKE 1/2 TABLET BY MOUTH IN THE MORNING AND 1 TABLET AT BEDTIME, Disp: , Rfl:  .  Pediatric Multiple Vitamins (FLINTSTONES MULTIVITAMIN PO), Take by mouth., Disp: , Rfl:  .  Polyethyl Glycol-Propyl Glycol (SYSTANE OP), Apply 1 drop to eye daily as needed (dry eyes)., Disp: , Rfl:  .  prochlorperazine (COMPAZINE) 10 MG tablet, Take 1 tablet (10 mg total) by mouth every 6 (six) hours as needed (Nausea or vomiting)., Disp: 30 tablet, Rfl: 1 .  pyridOXINE (VITAMIN B-6) 100 MG tablet, Take 1 tablet (100 mg total) by mouth daily., Disp: 30 tablet, Rfl: 3 .  senna-docusate (SENOKOT-S) 8.6-50 MG tablet, Take 1 tablet at bedtime as needed by mouth for mild constipation. (Patient taking differently: Take 1 tablet by mouth at bedtime as needed for mild constipation. ), Disp: , Rfl:  .  sucralfate (CARAFATE) 1 g tablet, Take 1 tablet (1 g total) by mouth 3 (three) times daily before meals., Disp: 90 tablet, Rfl: 4 .  traMADol (ULTRAM) 50 MG tablet, Take 1 tablet (50 mg total) by mouth 3 (three) times daily as needed for moderate pain., Disp: 30 tablet, Rfl: 1 .  traZODone (DESYREL) 50 MG tablet, TAKE 1/2 TABLET BY MOUTH AT BEDTIME IF NEEDED FOR SLEEP, Disp: 45 tablet, Rfl: 3 No current facility-administered medications for this visit.   Facility-Administered Medications Ordered in Other Visits:  .  fluorouracil (ADRUCIL) 5,200 mg in sodium chloride 0.9 % 146 mL chemo infusion, 2,400 mg/m2 (Treatment Plan Recorded), Intravenous, 1 day or 1 dose, YuEarlie ServerMD, 5,200 mg  at 06/09/17 1357  Physical exam:  Vitals:   06/09/17  1043  BP: 111/67  Pulse: 61  Temp: 98.5 F (36.9 C)  TempSrc: Tympanic  Weight: 221 lb 6 oz (100.4 kg)   Physical Exam  Constitutional: She is oriented to person, place, and time and well-developed, well-nourished, and in no distress. No distress.  HENT:  Head: Normocephalic and atraumatic.  Mouth/Throat: Oropharynx is clear and moist. No oropharyngeal exudate.  Eyes: Conjunctivae and EOM are normal. Pupils are equal, round, and reactive to light. Left eye exhibits no discharge. No scleral icterus.  Neck: Normal range of motion. Neck supple. No tracheal deviation present.  Cardiovascular: Normal rate and regular rhythm.  Murmur heard. Pulmonary/Chest: Effort normal and breath sounds normal. She has no wheezes.  Abdominal: Soft. Bowel sounds are normal. She exhibits no distension. There is no tenderness.  Musculoskeletal: Normal range of motion. She exhibits no edema or deformity.  Lymphadenopathy:    She has no cervical adenopathy.  Neurological: She is alert and oriented to person, place, and time. No cranial nerve deficit.  Skin: Skin is warm and dry. No erythema.  Psychiatric: Affect and judgment normal.    CMP Latest Ref Rng & Units 06/02/2017  Glucose 65 - 99 mg/dL 98  BUN 6 - 20 mg/dL 12  Creatinine 0.44 - 1.00 mg/dL 0.65  Sodium 135 - 145 mmol/L 139  Potassium 3.5 - 5.1 mmol/L 4.1  Chloride 101 - 111 mmol/L 101  CO2 22 - 32 mmol/L 30  Calcium 8.9 - 10.3 mg/dL 8.7(L)  Total Protein 6.5 - 8.1 g/dL 6.5  Total Bilirubin 0.3 - 1.2 mg/dL 0.5  Alkaline Phos 38 - 126 U/L 57  AST 15 - 41 U/L 32  ALT 14 - 54 U/L 20   CBC Latest Ref Rng & Units 06/02/2017  WBC 3.6 - 11.0 K/uL 3.6  Hemoglobin 12.0 - 16.0 g/dL 12.9  Hematocrit 35.0 - 47.0 % 40.3  Platelets 150 - 440 K/uL 117(L)   Molecular Testing:  #[employer] Cdx: No reportable alternations with companion diagnostic (CDx) claims.  MS stable Tumor mutation burden:  Cannot be determined CCND3 amplification: Equivocal.  Detailed report was scanned to [employer].   Amended reports on 06/04/2017, Tumor mutational burden changed from can not be determined to "TMB -low" on the re-analyzed samples.   # HER 2 negative.   Assessment and plan  Cancer Staging Malignant neoplasm of lower third of esophagus [facility]) Staging form: Esophagus - Adenocarcinoma, AJCC 8th Edition - Clinical stage from 04/10/2017: Stage IVB (cTX, cNX, pM1) - Signed by Earlie Server, MD on 04/10/2017  1. Malignant neoplasm of lower third of esophagus (HCC)   2. Encounter for antineoplastic chemotherapy   3. Goals of care, counseling/discussion   4. Neuropathy   5. Heart murmur   # Proceed with cycle 5  FOLFOX, tumor marker is trending down. Plan repeat image after 6 cycles.  # Grade 1 thrombocytopenia secondary to chemotherapy. We'll proceed with both 5-FU and oxaliplatin.  # Iron deficiency anemia: s/p 4 doses of IV venofer. Ferritin has improved. Hold additional IV iron. I told patient that she can stop taking oral iron tablets.   She is getting palliative radiation. Continue follow up with RadOnc for RT. # Pre-existing peripheral neuropathy: continue Lyrica and B6 supplementation, Oxaliplatin dose were reduced to 5m/m2. Her neuraopthy appears stable on oxaliplatin, not getting worse. Plan finish 6 cycles of Oxaliplatin, then proceed with single agent 5-FU.  #Heart murmur advised patient to establish care with cardiology.:  Follow up in 2 week prior to cycle  6 of FOLFOX.   Earlie Server, MD, PhD Hematology Oncology [facility] Pager- [phone] 06/09/17

## 2017-06-09 ENCOUNTER — Inpatient Hospital Stay: Payer: Medicare Other

## 2017-06-09 ENCOUNTER — Inpatient Hospital Stay (HOSPITAL_BASED_OUTPATIENT_CLINIC_OR_DEPARTMENT_OTHER): Payer: Medicare Other | Admitting: Oncology

## 2017-06-09 ENCOUNTER — Other Ambulatory Visit: Payer: Self-pay

## 2017-06-09 ENCOUNTER — Encounter: Payer: Self-pay | Admitting: Oncology

## 2017-06-09 ENCOUNTER — Ambulatory Visit
Admission: RE | Admit: 2017-06-09 | Discharge: 2017-06-09 | Disposition: A | Discharge status: Home / Self Care | Payer: Medicare Other | Source: Ambulatory Visit | Attending: Radiation Oncology | Admitting: Radiation Oncology

## 2017-06-09 VITALS — BP 111/67 | HR 61 | Temp 98.5°F | Wt 221.4 lb

## 2017-06-09 DIAGNOSIS — Z87442 Personal history of urinary calculi: Secondary | ICD-10-CM

## 2017-06-09 DIAGNOSIS — Z87891 Personal history of nicotine dependence: Secondary | ICD-10-CM

## 2017-06-09 DIAGNOSIS — Z7189 Other specified counseling: Secondary | ICD-10-CM

## 2017-06-09 DIAGNOSIS — Z9989 Dependence on other enabling machines and devices: Secondary | ICD-10-CM

## 2017-06-09 DIAGNOSIS — M1990 Unspecified osteoarthritis, unspecified site: Secondary | ICD-10-CM

## 2017-06-09 DIAGNOSIS — G629 Polyneuropathy, unspecified: Secondary | ICD-10-CM | POA: Diagnosis not present

## 2017-06-09 DIAGNOSIS — T451X5S Adverse effect of antineoplastic and immunosuppressive drugs, sequela: Secondary | ICD-10-CM

## 2017-06-09 DIAGNOSIS — C155 Malignant neoplasm of lower third of esophagus: Secondary | ICD-10-CM | POA: Diagnosis not present

## 2017-06-09 DIAGNOSIS — Z808 Family history of malignant neoplasm of other organs or systems: Secondary | ICD-10-CM

## 2017-06-09 DIAGNOSIS — Z803 Family history of malignant neoplasm of breast: Secondary | ICD-10-CM

## 2017-06-09 DIAGNOSIS — D500 Iron deficiency anemia secondary to blood loss (chronic): Secondary | ICD-10-CM | POA: Diagnosis not present

## 2017-06-09 DIAGNOSIS — N3281 Overactive bladder: Secondary | ICD-10-CM

## 2017-06-09 DIAGNOSIS — R1013 Epigastric pain: Secondary | ICD-10-CM

## 2017-06-09 DIAGNOSIS — D6959 Other secondary thrombocytopenia: Secondary | ICD-10-CM

## 2017-06-09 DIAGNOSIS — G4733 Obstructive sleep apnea (adult) (pediatric): Secondary | ICD-10-CM

## 2017-06-09 DIAGNOSIS — Z5111 Encounter for antineoplastic chemotherapy: Secondary | ICD-10-CM

## 2017-06-09 DIAGNOSIS — Z8042 Family history of malignant neoplasm of prostate: Secondary | ICD-10-CM

## 2017-06-09 DIAGNOSIS — R634 Abnormal weight loss: Secondary | ICD-10-CM | POA: Diagnosis not present

## 2017-06-09 DIAGNOSIS — I10 Essential (primary) hypertension: Secondary | ICD-10-CM

## 2017-06-09 DIAGNOSIS — R600 Localized edema: Secondary | ICD-10-CM

## 2017-06-09 DIAGNOSIS — J449 Chronic obstructive pulmonary disease, unspecified: Secondary | ICD-10-CM | POA: Diagnosis not present

## 2017-06-09 DIAGNOSIS — Z90710 Acquired absence of both cervix and uterus: Secondary | ICD-10-CM

## 2017-06-09 DIAGNOSIS — R011 Cardiac murmur, unspecified: Secondary | ICD-10-CM

## 2017-06-09 DIAGNOSIS — Z85820 Personal history of malignant melanoma of skin: Secondary | ICD-10-CM

## 2017-06-09 DIAGNOSIS — Z79899 Other long term (current) drug therapy: Secondary | ICD-10-CM

## 2017-06-09 DIAGNOSIS — Z801 Family history of malignant neoplasm of trachea, bronchus and lung: Secondary | ICD-10-CM

## 2017-06-09 DIAGNOSIS — Z8052 Family history of malignant neoplasm of bladder: Secondary | ICD-10-CM

## 2017-06-09 DIAGNOSIS — R630 Anorexia: Secondary | ICD-10-CM

## 2017-06-09 LAB — COMPREHENSIVE METABOLIC PANEL
ALBUMIN: 3.3 g/dL — AB (ref 3.5–5.0)
ALK PHOS: 59 U/L (ref 38–126)
ALT: 18 U/L (ref 14–54)
AST: 33 U/L (ref 15–41)
Anion gap: 8 (ref 5–15)
BILIRUBIN TOTAL: 0.6 mg/dL (ref 0.3–1.2)
BUN: 10 mg/dL (ref 6–20)
CO2: 29 mmol/L (ref 22–32)
Calcium: 9 mg/dL (ref 8.9–10.3)
Chloride: 102 mmol/L (ref 101–111)
Creatinine, Ser: 0.72 mg/dL (ref 0.44–1.00)
GFR calc Af Amer: >60 mL/min (ref >60)
GFR calc non Af Amer: >60 mL/min (ref >60)
Glucose, Bld: 137 mg/dL — ABNORMAL HIGH (ref 65–99)
POTASSIUM: 4 mmol/L (ref 3.5–5.1)
SODIUM: 139 mmol/L (ref 135–145)
TOTAL PROTEIN: 6.3 g/dL — AB (ref 6.5–8.1)

## 2017-06-09 LAB — CBC WITH DIFFERENTIAL/PLATELET
BASOS ABS: 0 10*3/uL (ref 0–0.1)
Basophils Relative: 1 %
EOS PCT: 10 %
Eosinophils Absolute: 0.3 10*3/uL (ref 0–0.7)
HEMATOCRIT: 39.8 % (ref 35.0–47.0)
Hemoglobin: 12.8 g/dL (ref 12.0–16.0)
LYMPHS PCT: 8 %
Lymphs Abs: 0.2 10*3/uL — ABNORMAL LOW (ref 1.0–3.6)
MCH: 29.7 pg (ref 26.0–34.0)
MCHC: 32.3 g/dL (ref 32.0–36.0)
MCV: 92.1 fL (ref 80.0–100.0)
MONOS PCT: 22 %
Monocytes Absolute: 0.5 10*3/uL (ref 0.2–0.9)
Neutro Abs: 1.4 10*3/uL (ref 1.4–6.5)
Neutrophils Relative %: 59 %
PLATELETS: 118 10*3/uL — AB (ref 150–440)
RBC: 4.32 MIL/uL (ref 3.80–5.20)
RDW: 17.9 % — AB (ref 11.5–14.5)
WBC: 2.5 10*3/uL — ABNORMAL LOW (ref 3.6–11.0)

## 2017-06-09 MED ORDER — LEUCOVORIN CALCIUM INJECTION 350 MG
850.0000 mg | Freq: Once | INTRAVENOUS | Status: AC
  Administered 2017-06-09: 850 mg via INTRAVENOUS
  Filled 2017-06-09: qty 42.5

## 2017-06-09 MED ORDER — FLUOROURACIL CHEMO INJECTION 2.5 GM/50ML
400.0000 mg/m2 | Freq: Once | INTRAVENOUS | Status: AC
  Administered 2017-06-09: 850 mg via INTRAVENOUS
  Filled 2017-06-09: qty 17

## 2017-06-09 MED ORDER — OXALIPLATIN CHEMO INJECTION 100 MG/20ML
65.0000 mg/m2 | Freq: Once | INTRAVENOUS | Status: AC
  Administered 2017-06-09: 140 mg via INTRAVENOUS
  Filled 2017-06-09: qty 8

## 2017-06-09 MED ORDER — PALONOSETRON HCL INJECTION 0.25 MG/5ML
0.2500 mg | Freq: Once | INTRAVENOUS | Status: AC
  Administered 2017-06-09: 0.25 mg via INTRAVENOUS
  Filled 2017-06-09: qty 5

## 2017-06-09 MED ORDER — DEXTROSE 5 % IV SOLN
Freq: Once | INTRAVENOUS | Status: AC
  Administered 2017-06-09: 11:00:00 via INTRAVENOUS
  Filled 2017-06-09: qty 1000

## 2017-06-09 MED ORDER — DEXAMETHASONE SODIUM PHOSPHATE 10 MG/ML IJ SOLN
10.0000 mg | Freq: Once | INTRAMUSCULAR | Status: AC
  Administered 2017-06-09: 10 mg via INTRAVENOUS
  Filled 2017-06-09: qty 1

## 2017-06-09 MED ORDER — LEUCOVORIN CALCIUM INJECTION 100 MG: 20.0000 mg/m2 | Freq: Once | INTRAMUSCULAR | Status: DC

## 2017-06-09 MED ORDER — SODIUM CHLORIDE 0.9 % IV SOLN
2400.0000 mg/m2 | INTRAVENOUS | Status: DC
  Administered 2017-06-09: 5200 mg via INTRAVENOUS
  Filled 2017-06-09: qty 104

## 2017-06-09 NOTE — Progress Notes (Signed)
Patient here today for follow up.   

## 2017-06-09 NOTE — Progress Notes (Signed)
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  Patient receiving chemotherapy and radiation therapy.    Met with patient and sister this am during infusion.  Patient reports that she has felt more nauseated this go round with chemotherapy.  Reports that she is using protein powder (30 g per scoop) mixed with juice or water and really likes the taste of it.  Can't remember the name of it. Drinking one shake per day.  Still trying to eat good sources of protein and graze during the day.    Medications: reviewed  Labs: reviewed  Anthropometrics:   Weight decreased to 221 lb 6 oz today from 224 lb on 1/14.  229 lb 9 oz on 12/17.  UBW 257 lb (last year)  14% weight loss in the last year, not significant 3% weight loss in the last 1 1/2 months, not significant   NUTRITION DIAGNOSIS: Inadequate oral intake continues with weight loss   INTERVENTION:   Encouraged patient to mix protein powder with lactose free milk vs water or juice for more calories and protein.  Encouraged patient to increase shake to 2 times per day for more calories and protein. Encouraged high calorie, high protein foods to prevent further weight loss  Clarified nausea medication regimen with RN, Cecille Rubin and discussed with patient.  Can alternate zofran and compazine.  Encouraged patient to call clinic if this does not relieve her symptoms and allow her to eat.   MONITORING, EVALUATION, GOAL: weight trends, intake   NEXT VISIT: Feb 11 during infusion  Alwin Lanigan B. Zenia Resides, [place], [place] Registered Dietitian [phone] (pager)

## 2017-06-09 NOTE — Progress Notes (Signed)
Proceed with treatment today per Dr. Tasia Catchings and Pharmacy

## 2017-06-11 ENCOUNTER — Inpatient Hospital Stay: Payer: Medicare Other

## 2017-06-11 VITALS — BP 131/79 | HR 65 | Temp 96.0°F | Resp 20

## 2017-06-11 DIAGNOSIS — Z5111 Encounter for antineoplastic chemotherapy: Secondary | ICD-10-CM | POA: Diagnosis not present

## 2017-06-11 DIAGNOSIS — C155 Malignant neoplasm of lower third of esophagus: Secondary | ICD-10-CM

## 2017-06-11 MED ORDER — HEPARIN SOD (PORK) LOCK FLUSH 100 UNIT/ML IV SOLN
500.0000 [IU] | Freq: Once | INTRAVENOUS | Status: AC | PRN
  Administered 2017-06-11: 500 [IU]

## 2017-06-11 MED ORDER — SODIUM CHLORIDE 0.9% FLUSH
10.0000 mL | INTRAVENOUS | Status: DC | PRN
  Administered 2017-06-11: 10 mL
  Filled 2017-06-11: qty 10

## 2017-06-12 ENCOUNTER — Encounter: Payer: Self-pay | Admitting: Oncology

## 2017-06-13 ENCOUNTER — Other Ambulatory Visit: Payer: Self-pay | Admitting: *Deleted

## 2017-06-13 MED ORDER — FLUTICASONE PROPIONATE 50 MCG/ACT NA SUSP: 1.0000 | Freq: Every day | NASAL | 5 refills | Status: AC

## 2017-06-16 ENCOUNTER — Telehealth: Payer: Self-pay | Admitting: *Deleted

## 2017-06-16 NOTE — Telephone Encounter (Addendum)
Per Dr Tasia Catchings, regular dental cleaning is fine, but NOT a deep cleaning. No need to have labs prior because her counts have not dropped with treatment.  Phone call returned to patient advised of doctor response and that she already discussed with her dentist

## 2017-06-16 NOTE — Telephone Encounter (Signed)
Patient called and asked if she would be ok to proceed with dental cleaning tomorrow. Her last chemotherapy was 1/28-30 and last ANC 1.4 06/09/17. Please advise

## 2017-06-22 NOTE — Progress Notes (Signed)
Hematology/Oncology Follow Up Note [facility]  [phone] [phone]  Patient Care Team: Tonia Ghent, MD as PCP - General (Family Medicine)   Name of the patient: [name]  [id number]  02-Oct-1934   REASON FOR VISIT Follow up of management of esophageal adenocarcinoma and management of iron deficiency anemia.  HISTORY OF PRESENT ILLNESS Patient is a 83 year old female who has metastatic esophageal cancer with liver involvement.  Weight loss 20 pounds for the past year prior to diagnosis.  # EGD during admission showed partially obstructive esophageal mass at the distal part of esophagus. Biopsy was taken. Pathology showed esophageal adenocarcinoma. #US biopsy of liver lesion to proof distant metastasis.  Report family history of breast cancer, but she has been having routine mammograms.  # Lives alone. She's a former smoker.  # Molecular Testing:  #[employer] Cdx: No reportable alternations with companion diagnostic (CDx) claims.  MS stable Tumor mutation burden: Cannot be determined, CCND3 amplification: Equivocal.  Amended reports on 06/04/2017, Tumor mutational burden changed from can not be determined to "TMB -low" on the re-analyzed samples.  # HER 2 negative.   INTERVAL HISTORY Patient presents for evaluation after cycle 6  FOLFOX chemotherapy for esophageal adenocarcinoma.  Reports feeling well, denies any nausea vomiting, or fever. She finished her Radiation about 10 days ago. She reports having pain swallowing this morning when she eats Yogurt.  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  Appetite is fair.   Current Pain Regimen:  Tramadol to 56m Q6h PRN. She uses half tablet sometimes.    Review of systems Review of Systems  Constitutional: Negative for chills, diaphoresis and fever.       Decreased appetite  HENT: Negative for hearing loss.   Eyes: Negative for blurred  vision.  Respiratory: Negative for cough and wheezing.   Cardiovascular: Negative for chest pain, claudication and leg swelling.  Gastrointestinal: Negative for constipation, heartburn, melena, nausea and vomiting.          Genitourinary: Negative for dysuria, flank pain and urgency.  Musculoskeletal: Negative for myalgias and neck pain.  Skin: Negative for rash.  Neurological: Negative for dizziness.  Endo/Heme/Allergies: Does not bruise/bleed easily.  Psychiatric/Behavioral: The patient is not nervous/anxious.     Allergies  Allergen Reactions  . Cefuroxime Nausea Only  . Codeine Nausea Only  . Doxycycline Other (See Comments)    Lip swelling  . Gabapentin Other (See Comments)    Swelling.   . Iohexol Itching    07-15-13 pt developed itching on fingers after contrast given. Dr. OIrish Elderslooked at pt and said to put in system as allergy. BB  . Other Other (See Comments)    Contrast Dye- caused fever, chills, awful feeling Bandages - itching, rash  . Oxycodone Other (See Comments)    nausea  . Quinolones Swelling    Lip swelling  . Synvisc [Hylan G-F 20] Swelling     Past Medical History:  Diagnosis Date  . Arthritis   . Asthma   . COPD (chronic obstructive pulmonary disease) ([place]   . Dysphonia   . Dyspnea   . Esophageal cancer ([place]   . Heart murmur   . History of kidney stones   . Hypertension   . Melanoma ([place]   . OAB (overactive bladder)   . OSA on CPAP   . 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 Personal history of chemotherapy  esophageal cancer  . Personal history of radiation therapy    esophageal cancer  . Pupil asymmetry    R pupil defect  . Skin cancer   . Sleep apnea   . Spinal cord abscess      Past Surgical History:  Procedure Laterality Date  . ABDOMINAL HYSTERECTOMY    . ANTERIOR CERVICAL DECOMP/DISCECTOMY FUSION N/A 07/16/2013   Procedure: ANTERIOR CERVICAL DECOMPRESSION/DISCECTOMY FUSION mutlipleLEVELS C4-7;  Surgeon:  Erline Levine, MD;  Location: [place] [facility];  Service: Neurosurgery;  Laterality: N/A;  . APPENDECTOMY    . carpel tunn Bilateral   . CATARACT EXTRACTION    . CATARACT EXTRACTION, BILATERAL    . CHOLECYSTECTOMY    . dental implant    . ESOPHAGOGASTRODUODENOSCOPY Left 03/21/2017   Procedure: ESOPHAGOGASTRODUODENOSCOPY (EGD);  Surgeon: Virgel Manifold, MD;  Location: [facility] ENDOSCOPY;  Service: Endoscopy;  Laterality: Left;  Marland Kitchen GASTRIC BYPASS    . HIATAL HERNIA REPAIR    . MELANOMA EXCISION    . PORTA CATH INSERTION N/A 04/07/2017   Procedure: PORTA CATH INSERTION;  Surgeon: Algernon Huxley, MD;  Location: [place] CV LAB;  Service: Cardiovascular;  Laterality: N/A;  . POSTERIOR CERVICAL FUSION/FORAMINOTOMY N/A 07/28/2013   Procedure: Cervical four-seven  posterior cervical fusion;  Surgeon: Erline Levine, MD;  Location: [place] [facility]UR[facility] ORS;  Service: Neurosurgery;  Laterality: N/A;  . TONSILLECTOMY    . TOTAL KNEE ARTHROPLASTY      Social History   Socioeconomic History  . Marital status: Widowed    Spouse name: Not on file  . Number of children: 2  . Years of education: Not on file  . Highest education level: Master's degree (e.g., MA, MS, MEng, MEd, MSW, MBA)  Social Needs  . Financial resource strain: Not hard at all  . Food insecurity - worry: Never true  . Food insecurity - inability: Never true  . Transportation needs - medical: No  . Transportation needs - non-medical: No  Occupational History  . Not on file  Tobacco Use  . Smoking status: Former Smoker    Packs/day: 1.00    Years: 20.00    Pack years: 20.00    Types: Cigarettes    Last attempt to quit: 1977    Years since quitting: 42.1  . Smokeless tobacco: Never Used  Substance and Sexual Activity  . Alcohol use: No  . Drug use: No  . Sexual activity: No  Other Topics Concern  . Not on file  Social History Narrative   Marital Status- Widowed    Lives by herself    Employement- Retired Pharmacist, hospital   Exercise hx-  Does PT     Family History  Problem Relation Age of Onset  . Breast cancer Mother 29  . Arthritis-Osteo Mother   . Breast cancer Sister 42  . Bladder Cancer Sister   . Bladder Cancer Father   . Tongue cancer Father   . Congenital heart disease Father   . Prostate cancer Brother   . Uterine cancer Maternal Aunt   . Lung cancer Paternal Uncle   . Leukemia Maternal Grandmother   . Liver cancer Paternal Grandmother   . Colon cancer Neg Hx      Current Outpatient Medications:  .  acetaminophen (TYLENOL) 325 MG tablet, Take 650 mg by mouth every 6 (six) hours as needed for moderate pain or headache. , Disp: , Rfl:  .  ALBUTEROL SULFATE HFA IN, Inhale into the lungs., Disp: , Rfl:  .  bisacodyl (DULCOLAX) 10 MG suppository, Place rectally., Disp: , Rfl:  .  calcium carbonate (TUMS EX) 750 MG chewable tablet, Chew by mouth., Disp: , Rfl:  .  Cholecalciferol (VITAMIN D) 2000 UNITS CAPS, Take 2,000 Units by mouth daily., Disp: , Rfl:  .  cyanocobalamin 500 MCG tablet, Take 500 mcg every other day by mouth., Disp: , Rfl:  .  dexamethasone (DECADRON) 4 MG tablet, Take 2 tablets (8 mg total) by mouth daily. Start the day after chemotherapy for 2 days. Take with food., Disp: 30 tablet, Rfl: 1 .  Diclofenac Sodium 1 % CREA, Apply to left arm as needed for pain three times daily, Disp: 120 g, Rfl: 0 .  fluticasone (FLONASE) 50 MCG/ACT nasal spray, Place 1-2 sprays into both nostrils daily., Disp: 16 g, Rfl: 5 .  hydrocortisone cream 1 %, Apply 1 application topically daily as needed for itching. , Disp: , Rfl:  .  lidocaine-prilocaine (EMLA) cream, Apply to affected area once, Disp: 30 g, Rfl: 3 .  loperamide (IMODIUM) 2 MG capsule, Take 1 capsule (2 mg total) by mouth See admin instructions. With onset of loose stool, take 79m followed by 258mevery 2 hours until 12 hours have passed without loose bowel movement. Maximum: 16 mg/day, Disp: 120 capsule, Rfl: 0 .  loratadine (CLARITIN) 10 MG tablet,  Take 10 mg by mouth daily., Disp: , Rfl:  .  LYRICA 150 MG capsule, TAKE 1 CAPSULE BY MOUTH TWO TIMES DAILY, Disp: 180 capsule, Rfl: 1 .  metoprolol succinate (TOPROL-XL) 50 MG 24 hr tablet, TAKE 1 TABLET BY MOUTH DAILY, Disp: 90 tablet, Rfl: 3 .  ondansetron (ZOFRAN) 8 MG tablet, Take 1 tablet (8 mg total) by mouth 2 (two) times daily as needed for refractory nausea / vomiting. Start on day 3 after chemotherapy., Disp: 30 tablet, Rfl: 1 .  oxybutynin (DITROPAN) 5 MG tablet, TAKE 1/2 TABLET BY MOUTH IN THE MORNING AND 1 TABLET AT BEDTIME, Disp: , Rfl:  .  Pediatric Multiple Vitamins (FLINTSTONES MULTIVITAMIN PO), Take by mouth., Disp: , Rfl:  .  Polyethyl Glycol-Propyl Glycol (SYSTANE OP), Apply 1 drop to eye daily as needed (dry eyes)., Disp: , Rfl:  .  prochlorperazine (COMPAZINE) 10 MG tablet, Take 1 tablet (10 mg total) by mouth every 6 (six) hours as needed (Nausea or vomiting)., Disp: 30 tablet, Rfl: 1 .  pyridOXINE (VITAMIN B-6) 100 MG tablet, Take 1 tablet (100 mg total) by mouth daily., Disp: 30 tablet, Rfl: 3 .  senna-docusate (SENOKOT-S) 8.6-50 MG tablet, Take 1 tablet at bedtime as needed by mouth for mild constipation. (Patient taking differently: Take 1 tablet by mouth at bedtime as needed for mild constipation. ), Disp: , Rfl:  .  sucralfate (CARAFATE) 1 g tablet, Take 1 tablet (1 g total) by mouth 3 (three) times daily before meals., Disp: 90 tablet, Rfl: 4 .  traMADol (ULTRAM) 50 MG tablet, Take 1 tablet (50 mg total) by mouth 3 (three) times daily as needed for moderate pain., Disp: 30 tablet, Rfl: 1 .  traZODone (DESYREL) 50 MG tablet, TAKE 1/2 TABLET BY MOUTH AT BEDTIME IF NEEDED FOR SLEEP, Disp: 45 tablet, Rfl: 3 .  pantoprazole (PROTONIX) 40 MG tablet, Take 1 tablet (40 mg total) by mouth daily., Disp: 30 tablet, Rfl: 1 No current facility-administered medications for this visit.   Facility-Administered Medications Ordered in Other Visits:  .  heparin lock flush 100 unit/mL,  500 Units, Intravenous, Once, YuEarlie ServerMD .  sodium  chloride flush (NS) 0.9 % injection 10 mL, 10 mL, Intravenous, PRN, Earlie Server, MD, 10 mL at 06/23/17 0158  Physical exam:  There were no vitals filed for this visit. Physical Exam  Constitutional: She is oriented to person, place, and time and well-developed, well-nourished, and in no distress. No distress.  HENT:  Head: Normocephalic and atraumatic.  Mouth/Throat: Oropharynx is clear and moist. No oropharyngeal exudate.  Eyes: Conjunctivae and EOM are normal. Pupils are equal, round, and reactive to light. Left eye exhibits no discharge. No scleral icterus.  Neck: Normal range of motion. Neck supple. No tracheal deviation present.  Cardiovascular: Normal rate and regular rhythm.  Murmur heard. Pulmonary/Chest: Effort normal and breath sounds normal. She has no wheezes.  Abdominal: Soft. Bowel sounds are normal. She exhibits no distension. There is no tenderness.  Musculoskeletal: Normal range of motion. She exhibits no edema or deformity.  Lymphadenopathy:    She has no cervical adenopathy.  Neurological: She is alert and oriented to person, place, and time. No cranial nerve deficit.  Skin: Skin is warm and dry. No erythema.  Psychiatric: Affect and judgment normal.    CMP Latest Ref Rng & Units 06/09/2017  Glucose 65 - 99 mg/dL 137(H)  BUN 6 - 20 mg/dL 10  Creatinine 0.44 - 1.00 mg/dL 0.72  Sodium 135 - 145 mmol/L 139  Potassium 3.5 - 5.1 mmol/L 4.0  Chloride 101 - 111 mmol/L 102  CO2 22 - 32 mmol/L 29  Calcium 8.9 - 10.3 mg/dL 9.0  Total Protein 6.5 - 8.1 g/dL 6.3(L)  Total Bilirubin 0.3 - 1.2 mg/dL 0.6  Alkaline Phos 38 - 126 U/L 59  AST 15 - 41 U/L 33  ALT 14 - 54 U/L 18   CBC Latest Ref Rng & Units 06/09/2017  WBC 3.6 - 11.0 K/uL 2.5(L)  Hemoglobin 12.0 - 16.0 g/dL 12.8  Hematocrit 35.0 - 47.0 % 39.8  Platelets 150 - 440 K/uL 118(L)    Assessment and plan  Cancer Staging Malignant neoplasm of lower third of  esophagus (HCC) Staging form: Esophagus - Adenocarcinoma, AJCC 8th Edition - Clinical stage from 04/10/2017: Stage IVB (cTX, cNX, pM1) - Signed by Earlie Server, MD on 04/10/2017  1. Malignant neoplasm of lower third of esophagus (HCC)   2. Encounter for antineoplastic chemotherapy   3. Neuropathy   4. Heart murmur   5. Iron deficiency anemia due to chronic blood loss    # Hold cycle 6 FOLFOX due to neutropenia. tumor marker is trending down. Plan obtain PET scan after 6 cycles.  #Neutropenia secondary to chemotherapy/Radiation. Plan Granix daily x 3. Follow up in 1 week for re-evaluation, repeat cbc, cmp  # Iron deficiency anemia: s/p 4 doses of IV venofer. Ferritin has improved. Hold additional IV iron. I told patient that she can stop taking oral iron tablets.  # Esophagitis: likely due to RT, patient has been taking Sucrafate without dissolving. Educated patient to dissolve tablet in water and drink.  Add Protonix 54m daily.  If no improvement, plan add Lidocaine solution/morhphine solution.   # Pre-existing peripheral neuropathy: continue Lyrica and B6 supplementation, Oxaliplatin dose were reduced to 635mm2. Her neuraopthy appears stable on oxaliplatin, not getting worse. Plan finish 6 cycles of Oxaliplatin, then proceed with single agent 5-FU. Chemotherapy held today.   #Heart murmur advised patient to establish care with cardiology.:   Follow up in 1 week to re-evaluate. Patient knows to call if any concerns or questions.   ZhEarlie ServerMD,  PhD Hematology [place] [facility] [place]- [phone] 06/23/17

## 2017-06-23 ENCOUNTER — Inpatient Hospital Stay: Payer: Medicare Other

## 2017-06-23 ENCOUNTER — Inpatient Hospital Stay (HOSPITAL_BASED_OUTPATIENT_CLINIC_OR_DEPARTMENT_OTHER): Payer: Medicare Other | Admitting: Oncology

## 2017-06-23 ENCOUNTER — Inpatient Hospital Stay: Payer: Medicare Other | Attending: Oncology

## 2017-06-23 ENCOUNTER — Other Ambulatory Visit: Payer: Self-pay

## 2017-06-23 ENCOUNTER — Encounter: Payer: Self-pay | Admitting: Oncology

## 2017-06-23 VITALS — BP 123/69 | HR 55 | Temp 97.8°F | Resp 16 | Ht 62.0 in | Wt 216.9 lb

## 2017-06-23 DIAGNOSIS — C787 Secondary malignant neoplasm of liver and intrahepatic bile duct: Secondary | ICD-10-CM | POA: Insufficient documentation

## 2017-06-23 DIAGNOSIS — D509 Iron deficiency anemia, unspecified: Secondary | ICD-10-CM | POA: Insufficient documentation

## 2017-06-23 DIAGNOSIS — D701 Agranulocytosis secondary to cancer chemotherapy: Secondary | ICD-10-CM | POA: Diagnosis not present

## 2017-06-23 DIAGNOSIS — R011 Cardiac murmur, unspecified: Secondary | ICD-10-CM | POA: Diagnosis not present

## 2017-06-23 DIAGNOSIS — C155 Malignant neoplasm of lower third of esophagus: Secondary | ICD-10-CM | POA: Insufficient documentation

## 2017-06-23 DIAGNOSIS — D500 Iron deficiency anemia secondary to blood loss (chronic): Secondary | ICD-10-CM

## 2017-06-23 DIAGNOSIS — K209 Esophagitis, unspecified: Secondary | ICD-10-CM | POA: Diagnosis not present

## 2017-06-23 DIAGNOSIS — Z5111 Encounter for antineoplastic chemotherapy: Secondary | ICD-10-CM | POA: Diagnosis present

## 2017-06-23 DIAGNOSIS — G629 Polyneuropathy, unspecified: Secondary | ICD-10-CM | POA: Diagnosis not present

## 2017-06-23 LAB — COMPREHENSIVE METABOLIC PANEL
ALK PHOS: 61 U/L (ref 38–126)
ALT: 21 U/L (ref 14–54)
ANION GAP: 7 (ref 5–15)
AST: 43 U/L — ABNORMAL HIGH (ref 15–41)
Albumin: 3.1 g/dL — ABNORMAL LOW (ref 3.5–5.0)
BUN: 8 mg/dL (ref 6–20)
CALCIUM: 8.7 mg/dL — AB (ref 8.9–10.3)
CHLORIDE: 106 mmol/L (ref 101–111)
CO2: 27 mmol/L (ref 22–32)
Creatinine, Ser: 0.81 mg/dL (ref 0.44–1.00)
GFR calc non Af Amer: >60 mL/min (ref >60)
Glucose, Bld: 129 mg/dL — ABNORMAL HIGH (ref 65–99)
Potassium: 3.5 mmol/L (ref 3.5–5.1)
SODIUM: 140 mmol/L (ref 135–145)
Total Bilirubin: 0.8 mg/dL (ref 0.3–1.2)
Total Protein: 6 g/dL — ABNORMAL LOW (ref 6.5–8.1)

## 2017-06-23 LAB — CBC WITH DIFFERENTIAL/PLATELET
BASOS PCT: 3 %
Basophils Absolute: 0 10*3/uL (ref 0–0.1)
Eosinophils Absolute: 0.1 10*3/uL (ref 0–0.7)
Eosinophils Relative: 12 %
HEMATOCRIT: 38.3 % (ref 35.0–47.0)
HEMOGLOBIN: 12.6 g/dL (ref 12.0–16.0)
Lymphocytes Relative: 23 %
Lymphs Abs: 0.2 10*3/uL — ABNORMAL LOW (ref 1.0–3.6)
MCH: 30.4 pg (ref 26.0–34.0)
MCHC: 32.8 g/dL (ref 32.0–36.0)
MCV: 92.7 fL (ref 80.0–100.0)
MONOS PCT: 37 %
Monocytes Absolute: 0.4 10*3/uL (ref 0.2–0.9)
NEUTROS ABS: 0.2 10*3/uL — AB (ref 1.4–6.5)
NEUTROS PCT: 25 %
Platelets: 101 10*3/uL — ABNORMAL LOW (ref 150–440)
RBC: 4.14 MIL/uL (ref 3.80–5.20)
RDW: 18.5 % — ABNORMAL HIGH (ref 11.5–14.5)
WBC: 0.9 10*3/uL — CL (ref 3.6–11.0)

## 2017-06-23 MED ORDER — HEPARIN SOD (PORK) LOCK FLUSH 100 UNIT/ML IV SOLN
500.0000 [IU] | Freq: Once | INTRAVENOUS | Status: AC
  Administered 2017-06-23: 500 [IU] via INTRAVENOUS
  Filled 2017-06-23: qty 5

## 2017-06-23 MED ORDER — HEPARIN SOD (PORK) LOCK FLUSH 100 UNIT/ML IV SOLN: 500.0000 [IU] | Freq: Once | INTRAVENOUS | Status: DC | PRN

## 2017-06-23 MED ORDER — PANTOPRAZOLE SODIUM 40 MG PO TBEC: 40.0000 mg | DELAYED_RELEASE_TABLET | Freq: Every day | ORAL | 1 refills | Status: DC

## 2017-06-23 MED ORDER — SODIUM CHLORIDE 0.9% FLUSH
10.0000 mL | INTRAVENOUS | Status: DC | PRN
  Administered 2017-06-23 (×2): 10 mL via INTRAVENOUS
  Filled 2017-06-23: qty 10

## 2017-06-23 MED ORDER — SODIUM CHLORIDE 0.9% FLUSH
10.0000 mL | INTRAVENOUS | Status: DC | PRN
Start: 2017-06-23 — End: 2017-06-23
  Filled 2017-06-23: qty 10

## 2017-06-23 MED ORDER — TBO-FILGRASTIM 480 MCG/0.8ML ~~LOC~~ SOSY
480.0000 ug | PREFILLED_SYRINGE | Freq: Once | SUBCUTANEOUS | Status: AC
  Administered 2017-06-23: 480 ug via SUBCUTANEOUS
  Filled 2017-06-23: qty 0.8

## 2017-06-23 NOTE — Progress Notes (Signed)
Per Dr. Tasia Catchings, no treatment today, pt to receive Granix only.

## 2017-06-23 NOTE — Progress Notes (Signed)
Patient here for pretreatment check. She experienced a sharp burning in her throat this AM and has lost 5lbs since her last appt.

## 2017-06-23 NOTE — Progress Notes (Signed)
Nutrition  Planning nutrition follow-up during infusion today but infusion cancelled.  [name] B. Zenia Resides, [place], [place] Registered Dietitian [phone] (pager)

## 2017-06-24 ENCOUNTER — Inpatient Hospital Stay: Payer: Medicare Other

## 2017-06-25 ENCOUNTER — Inpatient Hospital Stay: Payer: Medicare Other

## 2017-06-30 ENCOUNTER — Other Ambulatory Visit: Payer: Self-pay | Admitting: *Deleted

## 2017-06-30 DIAGNOSIS — C155 Malignant neoplasm of lower third of esophagus: Secondary | ICD-10-CM

## 2017-06-30 MED ORDER — PROCHLORPERAZINE MALEATE 10 MG PO TABS: 10.0000 mg | ORAL_TABLET | Freq: Four times a day (QID) | ORAL | 1 refills | Status: DC | PRN

## 2017-06-30 MED ORDER — ONDANSETRON HCL 8 MG PO TABS: 8.0000 mg | ORAL_TABLET | Freq: Two times a day (BID) | ORAL | 1 refills | Status: DC | PRN

## 2017-06-30 NOTE — Progress Notes (Signed)
Hematology/Oncology Follow Up Note [facility]  [phone] [phone]  Patient Care Team: Tonia Ghent, MD as PCP - General (Family Medicine)   Name of the patient: [name]  [id number]  02-24-1935   REASON FOR VISIT Follow up of management of esophageal adenocarcinoma and management of iron deficiency anemia.  HISTORY OF PRESENT ILLNESS Patient is a 83 year old female who has metastatic esophageal cancer with liver involvement.  Weight loss 20 pounds for the past year prior to diagnosis.  # EGD during admission showed partially obstructive esophageal mass at the distal part of esophagus. Biopsy was taken. Pathology showed esophageal adenocarcinoma. #US biopsy of liver lesion to proof distant metastasis.  Report family history of breast cancer, but she has been having routine mammograms.  # Lives alone. She's a former smoker.  # Molecular Testing:  #[employer]One Cdx: No reportable alternations with companion diagnostic (CDx) claims.  MS stable Tumor mutation burden: Cannot be determined, CCND3 amplification: Equivocal.  Amended reports on 06/04/2017, Tumor mutational burden changed from can not be determined to "TMB -low" on the re-analyzed samples.  # HER 2 negative.   INTERVAL HISTORY Patient presents for evaluation prior to cycle 6  FOLFOX chemotherapy for esophageal adenocarcinoma. Cycle 6 is delayed for 1 week due to neutropenia. She received Granix 466mg x 3.  Today feeling well, without any new symptoms. Radiation was about 2 weeks ago.  Reports feeling well, denies any nausea vomiting, or fever. 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   She has lost weight since 2 weeks ago, but weight is stable comparing to 1 week ago.    Current Pain Regimen:  Tramadol to 569mQ6h PRN. She uses half tablet sometimes.    Review of systems Review of Systems  Constitutional: Negative for  chills, diaphoresis and fever.       Decreased appetite  HENT: Negative for hearing loss.   Eyes: Negative for blurred vision.  Respiratory: Negative for cough and wheezing.   Cardiovascular: Negative for chest pain, claudication and leg swelling.  Gastrointestinal: Negative for constipation, heartburn, melena, nausea and vomiting.          Genitourinary: Negative for dysuria, flank pain and urgency.  Musculoskeletal: Negative for myalgias and neck pain.  Skin: Negative for rash.  Neurological: Negative for dizziness.  Endo/Heme/Allergies: Does not bruise/bleed easily.  Psychiatric/Behavioral: The patient is not nervous/anxious.     Allergies  Allergen Reactions  . Cefuroxime Nausea Only  . Codeine Nausea Only  . Doxycycline Other (See Comments)    Lip swelling  . Gabapentin Other (See Comments)    Swelling.   . Iohexol Itching    07-15-13 pt developed itching on fingers after contrast given. Dr. OlIrish Eldersooked at pt and said to put in system as allergy. BB  . Other Other (See Comments)    Contrast Dye- caused fever, chills, awful feeling Bandages - itching, rash  . Oxycodone Other (See Comments)    nausea  . Quinolones Swelling    Lip swelling  . Synvisc [Hylan G-F 20] Swelling     Past Medical History:  Diagnosis Date  . Arthritis   . Asthma   . COPD (chronic obstructive pulmonary disease) ([place]  . Dysphonia   . Dyspnea   . Esophageal cancer ([place]  . Heart murmur   . History of kidney stones   . Hypertension   . Melanoma ([place]  . OAB (overactive bladder)   .  OSA on CPAP   . 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 Personal history of chemotherapy    esophageal cancer  . Personal history of radiation therapy    esophageal cancer  . Pupil asymmetry    R pupil defect  . Skin cancer   . Sleep apnea   . Spinal cord abscess      Past Surgical History:  Procedure Laterality Date  . ABDOMINAL HYSTERECTOMY    . ANTERIOR CERVICAL  DECOMP/DISCECTOMY FUSION N/A 07/16/2013   Procedure: ANTERIOR CERVICAL DECOMPRESSION/DISCECTOMY FUSION mutlipleLEVELS C4-7;  Surgeon: Erline Levine, MD;  Location: [place] NEURO ORS;  Service: Neurosurgery;  Laterality: N/A;  . APPENDECTOMY    . carpel tunn Bilateral   . CATARACT EXTRACTION    . CATARACT EXTRACTION, BILATERAL    . CHOLECYSTECTOMY    . dental implant    . ESOPHAGOGASTRODUODENOSCOPY Left 03/21/2017   Procedure: ESOPHAGOGASTRODUODENOSCOPY (EGD);  Surgeon: Virgel Manifold, MD;  Location: [facility] END[facility]COPY;  Service: Endoscopy;  Laterality: Left;  Marland Kitchen GASTRIC BYPASS    . HIATAL HERNIA REPAIR    . MELANOMA EXCISION    . PORTA CATH INSERTION N/A 04/07/2017   Procedure: PORTA CATH INSERTION;  Surgeon: Algernon Huxley, MD;  Location: [place] CV LAB;  Service: Cardiovascular;  Laterality: N/A;  . POSTERIOR CERVICAL FUSION/FORAMINOTOMY N/A 07/28/2013   Procedure: Cervical four-seven  posterior cervical fusion;  Surgeon: Erline Levine, MD;  Location: [place] NEURO ORS;  Service: Neurosurgery;  Laterality: N/A;  . TONSILLECTOMY    . TOTAL KNEE ARTHROPLASTY      Social History   Socioeconomic History  . Marital status: Widowed    Spouse name: Not on file  . Number of children: 2  . Years of education: Not on file  . Highest education level: Master's degree (e.g., MA, MS, MEng, MEd, MSW, MBA)  Social Needs  . Financial resource strain: Not hard at all  . Food insecurity - worry: Never true  . Food insecurity - inability: Never true  . Transportation needs - medical: No  . Transportation needs - non-medical: No  Occupational History  . Not on file  Tobacco Use  . Smoking status: Former Smoker    Packs/day: 1.00    Years: 20.00    Pack years: 20.00    Types: Cigarettes    Last attempt to quit: 1977    Years since quitting: 42.1  . Smokeless tobacco: Never Used  Substance and Sexual Activity  . Alcohol use: No  . Drug use: No  . Sexual activity: No  Other Topics Concern  . Not on  file  Social History Narrative   Marital Status- Widowed    Lives by herself    Employement- Retired Pharmacist, hospital   Exercise hx- Does PT     Family History  Problem Relation Age of Onset  . Breast cancer Mother 58  . Arthritis-Osteo Mother   . Breast cancer Sister 39  . Bladder Cancer Sister   . Bladder Cancer Father   . Tongue cancer Father   . Congenital heart disease Father   . Prostate cancer Brother   . Uterine cancer Maternal Aunt   . Lung cancer Paternal Uncle   . Leukemia Maternal Grandmother   . Liver cancer Paternal Grandmother   . Colon cancer Neg Hx      Current Outpatient Medications:  .  acetaminophen (TYLENOL) 325 MG tablet, Take 650 mg by mouth every 6 (six) hours as needed for  moderate pain or headache. , Disp: , Rfl:  .  ALBUTEROL SULFATE HFA IN, Inhale into the lungs., Disp: , Rfl:  .  calcium carbonate (TUMS EX) 750 MG chewable tablet, Chew by mouth., Disp: , Rfl:  .  Cholecalciferol (VITAMIN D) 2000 UNITS CAPS, Take 2,000 Units by mouth daily., Disp: , Rfl:  .  dexamethasone (DECADRON) 4 MG tablet, Take 2 tablets (8 mg total) by mouth daily. Start the day after chemotherapy for 2 days. Take with food., Disp: 30 tablet, Rfl: 1 .  Diclofenac Sodium 1 % CREA, Apply to left arm as needed for pain three times daily, Disp: 120 g, Rfl: 0 .  fluticasone (FLONASE) 50 MCG/ACT nasal spray, Place 1-2 sprays into both nostrils daily., Disp: 16 g, Rfl: 5 .  hydrocortisone cream 1 %, Apply 1 application topically daily as needed for itching. , Disp: , Rfl:  .  lidocaine-prilocaine (EMLA) cream, Apply to affected area once, Disp: 30 g, Rfl: 3 .  loperamide (IMODIUM) 2 MG capsule, Take 1 capsule (2 mg total) by mouth See admin instructions. With onset of loose stool, take 16m followed by 215mevery 2 hours until 12 hours have passed without loose bowel movement. Maximum: 16 mg/day, Disp: 120 capsule, Rfl: 0 .  loratadine (CLARITIN) 10 MG tablet, Take 10 mg by mouth daily., Disp: ,  Rfl:  .  LYRICA 150 MG capsule, TAKE 1 CAPSULE BY MOUTH TWO TIMES DAILY, Disp: 180 capsule, Rfl: 1 .  metoprolol succinate (TOPROL-XL) 50 MG 24 hr tablet, TAKE 1 TABLET BY MOUTH DAILY, Disp: 90 tablet, Rfl: 3 .  ondansetron (ZOFRAN) 8 MG tablet, Take 1 tablet (8 mg total) by mouth 2 (two) times daily as needed for refractory nausea / vomiting. Start on day 3 after chemotherapy., Disp: 30 tablet, Rfl: 1 .  oxybutynin (DITROPAN) 5 MG tablet, TAKE 1/2 TABLET BY MOUTH IN THE MORNING AND 1 TABLET AT BEDTIME, Disp: , Rfl:  .  Pediatric Multiple Vitamins (FLINTSTONES MULTIVITAMIN PO), Take by mouth., Disp: , Rfl:  .  Polyethyl Glycol-Propyl Glycol (SYSTANE OP), Apply 1 drop to eye daily as needed (dry eyes)., Disp: , Rfl:  .  prochlorperazine (COMPAZINE) 10 MG tablet, Take 1 tablet (10 mg total) by mouth every 6 (six) hours as needed (Nausea or vomiting)., Disp: 30 tablet, Rfl: 1 .  pyridOXINE (VITAMIN B-6) 100 MG tablet, Take 1 tablet (100 mg total) by mouth daily., Disp: 30 tablet, Rfl: 3 .  sucralfate (CARAFATE) 1 g tablet, Take 1 tablet (1 g total) by mouth 3 (three) times daily before meals., Disp: 90 tablet, Rfl: 4 .  traMADol (ULTRAM) 50 MG tablet, Take 1 tablet (50 mg total) by mouth 3 (three) times daily as needed for moderate pain., Disp: 30 tablet, Rfl: 1 .  bisacodyl (DULCOLAX) 10 MG suppository, Place rectally., Disp: , Rfl:  .  cyanocobalamin 500 MCG tablet, Take 500 mcg every other day by mouth., Disp: , Rfl:  .  dronabinol (MARINOL) 5 MG capsule, Take 1 capsule (5 mg total) by mouth 2 (two) times daily before lunch and supper., Disp: 60 capsule, Rfl: 0 .  pantoprazole (PROTONIX) 40 MG tablet, Take 1 tablet (40 mg total) by mouth daily. (Patient not taking: Reported on 07/01/2017), Disp: 30 tablet, Rfl: 1 .  senna-docusate (SENOKOT-S) 8.6-50 MG tablet, Take 1 tablet at bedtime as needed by mouth for mild constipation. (Patient not taking: Reported on 07/01/2017), Disp: , Rfl:  .  traZODone  (DESYREL) 50 MG tablet,  TAKE 1/2 TABLET BY MOUTH AT BEDTIME IF NEEDED FOR SLEEP, Disp: 45 tablet, Rfl: 3 No current facility-administered medications for this visit.   Facility-Administered Medications Ordered in Other Visits:  .  fluorouracil (ADRUCIL) 5,200 mg in sodium chloride 0.9 % 146 mL chemo infusion, 2,400 mg/m2 (Treatment Plan Recorded), Intravenous, 1 day or 1 dose, Earlie Server, MD, 5,200 mg at 07/01/17 1335 .  heparin lock flush 100 unit/mL, 500 Units, Intravenous, Once, Earlie Server, MD .  sodium chloride flush (NS) 0.9 % injection 10 mL, 10 mL, Intravenous, PRN, Earlie Server, MD, 10 mL at 07/01/17 0909  Physical exam:  Vitals:   07/01/17 0936  BP: 122/77  Pulse: 71  Resp: 18  Temp: 98.2 F (36.8 C)  TempSrc: Tympanic  SpO2: 92%  Weight: 216 lb (98 kg)  Height: _0  (1.575 m)   Physical Exam  Constitutional: She is oriented to person, place, and time and well-developed, well-nourished, and in no distress. No distress.  HENT:  Head: Normocephalic and atraumatic.  Mouth/Throat: Oropharynx is clear and moist. No oropharyngeal exudate.  Eyes: Conjunctivae and EOM are normal. Pupils are equal, round, and reactive to light. Left eye exhibits no discharge. No scleral icterus.  Neck: Normal range of motion. Neck supple.  Cardiovascular: Normal rate and regular rhythm.  Murmur heard. Pulmonary/Chest: Effort normal and breath sounds normal. She has no wheezes. She exhibits no tenderness.  Abdominal: Soft. Bowel sounds are normal. She exhibits no distension. There is no tenderness.  Musculoskeletal: Normal range of motion. She exhibits no edema or deformity.  Lymphadenopathy:    She has no cervical adenopathy.  Neurological: She is alert and oriented to person, place, and time. No cranial nerve deficit.  Skin: Skin is warm and dry. No erythema.  Psychiatric: Affect and judgment normal.    CMP Latest Ref Rng & Units 06/23/2017  Glucose 65 - 99 mg/dL 129(H)  BUN 6 - 20 mg/dL 8    Creatinine 0.44 - 1.00 mg/dL 0.81  Sodium 135 - 145 mmol/L 140  Potassium 3.5 - 5.1 mmol/L 3.5  Chloride 101 - 111 mmol/L 106  CO2 22 - 32 mmol/L 27  Calcium 8.9 - 10.3 mg/dL 8.7(L)  Total Protein 6.5 - 8.1 g/dL 6.0(L)  Total Bilirubin 0.3 - 1.2 mg/dL 0.8  Alkaline Phos 38 - 126 U/L 61  AST 15 - 41 U/L 43(H)  ALT 14 - 54 U/L 21   CBC Latest Ref Rng & Units 06/23/2017  WBC 3.6 - 11.0 K/uL 0.9(LL)  Hemoglobin 12.0 - 16.0 g/dL 12.6  Hematocrit 35.0 - 47.0 % 38.3  Platelets 150 - 440 K/uL 101(L)    Assessment and plan  Cancer Staging Malignant neoplasm of lower third of esophagus (HCC) Staging form: Esophagus - Adenocarcinoma, AJCC 8th Edition - Clinical stage from 04/10/2017: Stage IVB (cTX, cNX, pM1) - Signed by Earlie Server, MD on 04/10/2017  1. Malignant neoplasm of lower third of esophagus (HCC)   2. Iron deficiency anemia due to chronic blood loss   3. Encounter for antineoplastic chemotherapy    # Proceed with cycle 6 FOLFOX due to neutropenia. tumor marker is trending down. Plan obtain PET scan after 6 cycles.  #Neutropenia secondary to chemotherapy/Radiation. [place] recovered after Granix daily x 3. Plan Granix 449mg x 1 on Day 4 this cycle.    # Iron deficiency anemia: s/p 4 doses of IV venofer. Ferritin has improved. Hold additional IV iron. I told patient that she can stop taking oral iron tablets.  #  Esophagitis: likely due to RT, Continue sucrafate and Protonix 36m daily. Symptoms improved.   # Pre-existing peripheral neuropathy: continue Lyrica and B6 supplementation, Oxaliplatin dose were reduced to 658mm2. Her neuraopthy appears stable on oxaliplatin, not getting worse. Plan finish 6 cycles of Oxaliplatin, then proceed with single agent 5-FU.   #Heart murmur advised patient to establish care with cardiology, she will ask her new primary care physician for a referral.   Follow up in 2week to discuss PET scan and prior to next cycle of treatment.  Patient knows to  call if any concerns or questions.   ZhEarlie ServerMD, PhD Hematology Oncology [facility]- [phone]/18/19

## 2017-07-01 ENCOUNTER — Inpatient Hospital Stay: Payer: Medicare Other

## 2017-07-01 ENCOUNTER — Inpatient Hospital Stay (HOSPITAL_BASED_OUTPATIENT_CLINIC_OR_DEPARTMENT_OTHER): Payer: Medicare Other | Admitting: Oncology

## 2017-07-01 ENCOUNTER — Encounter: Payer: Self-pay | Admitting: Oncology

## 2017-07-01 VITALS — BP 122/77 | HR 71 | Temp 98.2°F | Resp 18 | Ht 62.0 in | Wt 216.0 lb

## 2017-07-01 DIAGNOSIS — K209 Esophagitis, unspecified: Secondary | ICD-10-CM

## 2017-07-01 DIAGNOSIS — C787 Secondary malignant neoplasm of liver and intrahepatic bile duct: Secondary | ICD-10-CM | POA: Diagnosis not present

## 2017-07-01 DIAGNOSIS — Z5111 Encounter for antineoplastic chemotherapy: Secondary | ICD-10-CM | POA: Diagnosis not present

## 2017-07-01 DIAGNOSIS — D509 Iron deficiency anemia, unspecified: Secondary | ICD-10-CM

## 2017-07-01 DIAGNOSIS — D701 Agranulocytosis secondary to cancer chemotherapy: Secondary | ICD-10-CM

## 2017-07-01 DIAGNOSIS — C155 Malignant neoplasm of lower third of esophagus: Secondary | ICD-10-CM

## 2017-07-01 DIAGNOSIS — G629 Polyneuropathy, unspecified: Secondary | ICD-10-CM | POA: Diagnosis not present

## 2017-07-01 DIAGNOSIS — D500 Iron deficiency anemia secondary to blood loss (chronic): Secondary | ICD-10-CM

## 2017-07-01 DIAGNOSIS — R011 Cardiac murmur, unspecified: Secondary | ICD-10-CM

## 2017-07-01 LAB — COMPREHENSIVE METABOLIC PANEL
ALK PHOS: 77 U/L (ref 38–126)
ALT: 15 U/L (ref 14–54)
AST: 39 U/L (ref 15–41)
Albumin: 3.4 g/dL — ABNORMAL LOW (ref 3.5–5.0)
Anion gap: 9 (ref 5–15)
BUN: 12 mg/dL (ref 6–20)
CALCIUM: 9 mg/dL (ref 8.9–10.3)
CHLORIDE: 103 mmol/L (ref 101–111)
CO2: 29 mmol/L (ref 22–32)
CREATININE: 0.82 mg/dL (ref 0.44–1.00)
Glucose, Bld: 112 mg/dL — ABNORMAL HIGH (ref 65–99)
Potassium: 3.9 mmol/L (ref 3.5–5.1)
Sodium: 141 mmol/L (ref 135–145)
Total Bilirubin: 0.6 mg/dL (ref 0.3–1.2)
Total Protein: 6.7 g/dL (ref 6.5–8.1)

## 2017-07-01 LAB — CBC WITH DIFFERENTIAL/PLATELET
BASOS PCT: 0 %
Basophils Absolute: 0 10*3/uL (ref 0–0.1)
EOS ABS: 0.1 10*3/uL (ref 0–0.7)
EOS PCT: 1 %
HCT: 40.9 % (ref 35.0–47.0)
Hemoglobin: 13.5 g/dL (ref 12.0–16.0)
LYMPHS ABS: 0.5 10*3/uL — AB (ref 1.0–3.6)
Lymphocytes Relative: 6 %
MCH: 30.5 pg (ref 26.0–34.0)
MCHC: 33.1 g/dL (ref 32.0–36.0)
MCV: 92.1 fL (ref 80.0–100.0)
MONOS PCT: 12 %
Monocytes Absolute: 1 10*3/uL — ABNORMAL HIGH (ref 0.2–0.9)
Neutro Abs: 6.7 10*3/uL — ABNORMAL HIGH (ref 1.4–6.5)
Neutrophils Relative %: 81 %
PLATELETS: 107 10*3/uL — AB (ref 150–440)
RBC: 4.44 MIL/uL (ref 3.80–5.20)
RDW: 18.9 % — ABNORMAL HIGH (ref 11.5–14.5)
WBC: 8.3 10*3/uL (ref 3.6–11.0)

## 2017-07-01 MED ORDER — FLUOROURACIL CHEMO INJECTION 2.5 GM/50ML
400.0000 mg/m2 | Freq: Once | INTRAVENOUS | Status: AC
  Administered 2017-07-01: 850 mg via INTRAVENOUS
  Filled 2017-07-01: qty 17

## 2017-07-01 MED ORDER — SODIUM CHLORIDE 0.9 % IV SOLN: 10.0000 mg | Freq: Once | INTRAVENOUS | Status: DC

## 2017-07-01 MED ORDER — DEXTROSE 5 % IV SOLN
65.0000 mg/m2 | Freq: Once | INTRAVENOUS | Status: AC
  Administered 2017-07-01: 140 mg via INTRAVENOUS
  Filled 2017-07-01: qty 20

## 2017-07-01 MED ORDER — DEXAMETHASONE SODIUM PHOSPHATE 10 MG/ML IJ SOLN
10.0000 mg | Freq: Once | INTRAMUSCULAR | Status: AC
  Administered 2017-07-01: 10 mg via INTRAVENOUS
  Filled 2017-07-01: qty 1

## 2017-07-01 MED ORDER — DRONABINOL 5 MG PO CAPS: 5.0000 mg | ORAL_CAPSULE | Freq: Two times a day (BID) | ORAL | 0 refills | Status: DC

## 2017-07-01 MED ORDER — LEUCOVORIN CALCIUM INJECTION 350 MG
850.0000 mg | Freq: Once | INTRAMUSCULAR | Status: AC
  Administered 2017-07-01: 850 mg via INTRAVENOUS
  Filled 2017-07-01: qty 40

## 2017-07-01 MED ORDER — SODIUM CHLORIDE 0.9 % IV SOLN
2400.0000 mg/m2 | INTRAVENOUS | Status: DC
  Administered 2017-07-01: 5200 mg via INTRAVENOUS
  Filled 2017-07-01: qty 20

## 2017-07-01 MED ORDER — DEXTROSE 5 % IV SOLN
Freq: Once | INTRAVENOUS | Status: AC
  Administered 2017-07-01: 11:00:00 via INTRAVENOUS
  Filled 2017-07-01: qty 1000

## 2017-07-01 MED ORDER — HEPARIN SOD (PORK) LOCK FLUSH 100 UNIT/ML IV SOLN
500.0000 [IU] | Freq: Once | INTRAVENOUS | Status: DC
  Filled 2017-07-01: qty 5

## 2017-07-01 MED ORDER — PALONOSETRON HCL INJECTION 0.25 MG/5ML
0.2500 mg | Freq: Once | INTRAVENOUS | Status: AC
  Administered 2017-07-01: 0.25 mg via INTRAVENOUS
  Filled 2017-07-01: qty 5

## 2017-07-01 MED ORDER — SODIUM CHLORIDE 0.9% FLUSH
10.0000 mL | INTRAVENOUS | Status: DC | PRN
  Administered 2017-07-01: 10 mL via INTRAVENOUS
  Filled 2017-07-01: qty 10

## 2017-07-01 NOTE — Progress Notes (Signed)
No new changes noted today 

## 2017-07-01 NOTE — Progress Notes (Signed)
clarified Lecovorin orders with Dr. Tasia Catchings, Pt to receive 400mg /m2 infusion.  Blood return noted before, during and after Adrucil push.

## 2017-07-02 ENCOUNTER — Inpatient Hospital Stay: Payer: Medicare Other

## 2017-07-02 ENCOUNTER — Telehealth: Payer: Self-pay | Admitting: Family Medicine

## 2017-07-02 LAB — CEA: CEA1: 11.8 ng/mL — AB (ref 0.0–4.7)

## 2017-07-02 NOTE — Telephone Encounter (Signed)
Left message on voicemail for patient to call back. 

## 2017-07-02 NOTE — Telephone Encounter (Signed)
Call pt.  Murmur noted by onc clinic.  She can either get an echo done and then come see me, or see me first, or we can refer to cards.  I don't think she has to see cards initially, at least w/o getting the echo done first. Let me know what she wants to do.  Okay to schedule OV if she wants to talk about this.  Thanks.

## 2017-07-03 ENCOUNTER — Telehealth: Payer: Self-pay

## 2017-07-03 ENCOUNTER — Inpatient Hospital Stay: Payer: Medicare Other

## 2017-07-03 DIAGNOSIS — C155 Malignant neoplasm of lower third of esophagus: Secondary | ICD-10-CM

## 2017-07-03 DIAGNOSIS — Z5111 Encounter for antineoplastic chemotherapy: Secondary | ICD-10-CM | POA: Diagnosis not present

## 2017-07-03 MED ORDER — HEPARIN SOD (PORK) LOCK FLUSH 100 UNIT/ML IV SOLN
500.0000 [IU] | Freq: Once | INTRAVENOUS | Status: AC | PRN
  Administered 2017-07-03: 500 [IU]
  Filled 2017-07-03: qty 5

## 2017-07-03 MED ORDER — SODIUM CHLORIDE 0.9% FLUSH
10.0000 mL | INTRAVENOUS | Status: DC | PRN
  Administered 2017-07-03: 10 mL
  Filled 2017-07-03: qty 10

## 2017-07-03 NOTE — Telephone Encounter (Signed)
PA for Dronabinol submitted. Will await determination. (Key: [id number]) - [id number]

## 2017-07-03 NOTE — Telephone Encounter (Signed)
Spoke with patient and scheduled appointment with Dr. Damita Dunnings.

## 2017-07-03 NOTE — Telephone Encounter (Signed)
Dronabinol has been approved. Pharmacy notified and verbalized understanding.

## 2017-07-04 ENCOUNTER — Inpatient Hospital Stay: Payer: Medicare Other

## 2017-07-04 DIAGNOSIS — D500 Iron deficiency anemia secondary to blood loss (chronic): Secondary | ICD-10-CM

## 2017-07-04 DIAGNOSIS — Z5111 Encounter for antineoplastic chemotherapy: Secondary | ICD-10-CM | POA: Diagnosis not present

## 2017-07-04 MED ORDER — TBO-FILGRASTIM 480 MCG/0.8ML ~~LOC~~ SOSY
480.0000 ug | PREFILLED_SYRINGE | Freq: Once | SUBCUTANEOUS | Status: AC
  Administered 2017-07-04: 480 ug via SUBCUTANEOUS

## 2017-07-07 ENCOUNTER — Encounter: Payer: Self-pay | Admitting: Family Medicine

## 2017-07-07 ENCOUNTER — Telehealth: Payer: Self-pay

## 2017-07-07 ENCOUNTER — Ambulatory Visit: Payer: Medicare Other | Admitting: Family Medicine

## 2017-07-07 VITALS — BP 130/72 | HR 55 | Temp 98.3°F | Wt 219.5 lb

## 2017-07-07 DIAGNOSIS — I350 Nonrheumatic aortic (valve) stenosis: Secondary | ICD-10-CM | POA: Diagnosis not present

## 2017-07-07 DIAGNOSIS — R011 Cardiac murmur, unspecified: Secondary | ICD-10-CM

## 2017-07-07 NOTE — Patient Instructions (Signed)
The murmur is likely from a narrowing of your aortic valve.  We'll get you set up with cardiology.  Take care.  Glad to see you. Update me as needed.

## 2017-07-07 NOTE — Progress Notes (Signed)
Murmur f/u.  Prev heard a few years ago per patient report.  Echo prev done 2018, d/w pt.  She had normal EF with mild to mod AS.  She can get a little SOB with exertion, over the last few years, with some occ exertional discomfort.  No pain now.  No pain recently, not since summer of 2018.  Not SOB now. She can't walk as far as she did prev, since she has been on cancer treatment and not exercising as much.   Able to lay flat in the bed w/o getting SOB.  Sleeping on 1 pillow.  Some occ BLE edema, L>R at baseline, noted since she was on lyrica.    She has seen oncology in the meantime with f/u PET scan pending.  She has some nausea but is tolerating treatment.  No vomiting.    PMH and SH reviewed  ROS: Per HPI unless specifically indicated in ROS section   Meds, vitals, and allergies reviewed.   GEN: nad, alert and oriented HEENT: mucous membranes moist NECK: supple w/o LA CV: rrr.  Murmur noted on exam, systolic, radiates to the carotids.   PULM: ctab, no inc wob ABD: soft, +bs EXT: trace, L>R, BLE edema SKIN: no acute rash

## 2017-07-07 NOTE — Assessment & Plan Note (Signed)
Mild to mod on echo.  Systolic but not diastolic murmur noted.  Path/phys d/w pt.  Okay for outpatient f/u.   Refer to cards.   Unclear to me how much of her sx are related to deconditioning and cancer treatment.  Defer to cards about considering of stress testing- this appears to be safe for outpatient consideration.  She doesn't appear to be in failure.  Prev echo d/w pt.  She can update me as needed.  She agrees.

## 2017-07-07 NOTE — Telephone Encounter (Signed)
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  Patient receiving chemotherapy. Radiation completed on 06/09/17.    Spoke with patient via phone.  Patient reports appetite is little bit better.   Noted has been approved for dronabinol for appetite.  Patient reports ate cereal with milk and peach for breakfast and beef and barley soup for lunch.  Reports that she has been drinking boost high protein or ensure protein max (lower calorie, higher protein shakes). Also has been drinking glucerna shakes as well as reports blood glucose is elevated.    Medications: reviewed  Labs: reviewed  Anthropometrics:   Weight increased to 219 lb 8 oz on 2/25 from 216 lb on 2/11 but decreased from wt of 221 lb on 1/28.     NUTRITION DIAGNOSIS: Inadequate oral intake continues   INTERVENTION:  Discussed higher calorie shakes with patient to try and encouraged 2 per day vs 1.  Patient reports that she is tolerating boost and ensure shakes, previously did not want to try them as thought would not be able to tolerate them (lactose intolerant)     MONITORING, EVALUATION, GOAL: weight trends, intake   NEXT VISIT: phone follow-up in 2 weeks as treatment days switched and not on day RD is in clinic  Juno Alers B. Zenia Resides, [place], [place] Registered Dietitian [phone] (pager)

## 2017-07-08 ENCOUNTER — Ambulatory Visit
Admission: RE | Admit: 2017-07-08 | Discharge: 2017-07-08 | Disposition: A | Discharge status: Home / Self Care | Payer: Medicare Other | Source: Ambulatory Visit | Attending: Oncology | Admitting: Oncology

## 2017-07-08 DIAGNOSIS — N200 Calculus of kidney: Secondary | ICD-10-CM | POA: Insufficient documentation

## 2017-07-08 DIAGNOSIS — C155 Malignant neoplasm of lower third of esophagus: Secondary | ICD-10-CM | POA: Insufficient documentation

## 2017-07-08 DIAGNOSIS — I2510 Atherosclerotic heart disease of native coronary artery without angina pectoris: Secondary | ICD-10-CM | POA: Diagnosis not present

## 2017-07-08 DIAGNOSIS — I517 Cardiomegaly: Secondary | ICD-10-CM | POA: Diagnosis not present

## 2017-07-08 DIAGNOSIS — Z9884 Bariatric surgery status: Secondary | ICD-10-CM | POA: Diagnosis not present

## 2017-07-08 DIAGNOSIS — M47896 Other spondylosis, lumbar region: Secondary | ICD-10-CM | POA: Diagnosis not present

## 2017-07-08 DIAGNOSIS — D500 Iron deficiency anemia secondary to blood loss (chronic): Secondary | ICD-10-CM

## 2017-07-08 DIAGNOSIS — Z9221 Personal history of antineoplastic chemotherapy: Secondary | ICD-10-CM | POA: Diagnosis not present

## 2017-07-08 DIAGNOSIS — Z5111 Encounter for antineoplastic chemotherapy: Secondary | ICD-10-CM

## 2017-07-08 DIAGNOSIS — N8189 Other female genital prolapse: Secondary | ICD-10-CM | POA: Insufficient documentation

## 2017-07-08 DIAGNOSIS — I700 Atherosclerosis of aorta: Secondary | ICD-10-CM | POA: Insufficient documentation

## 2017-07-08 LAB — GLUCOSE, CAPILLARY: GLUCOSE-CAPILLARY: 99 mg/dL (ref 65–99)

## 2017-07-08 MED ORDER — FLUDEOXYGLUCOSE F - 18 (FDG) INJECTION
12.9700 | Freq: Once | INTRAVENOUS | Status: AC
  Administered 2017-07-08: 12.97 via INTRAVENOUS

## 2017-07-09 ENCOUNTER — Ambulatory Visit
Admission: RE | Admit: 2017-07-09 | Discharge: 2017-07-09 | Disposition: A | Discharge status: Home / Self Care | Payer: Medicare Other | Source: Ambulatory Visit | Attending: Radiation Oncology | Admitting: Radiation Oncology

## 2017-07-09 ENCOUNTER — Other Ambulatory Visit: Payer: Self-pay

## 2017-07-09 ENCOUNTER — Encounter: Payer: Self-pay | Admitting: Radiation Oncology

## 2017-07-09 DIAGNOSIS — D709 Neutropenia, unspecified: Secondary | ICD-10-CM | POA: Insufficient documentation

## 2017-07-09 DIAGNOSIS — C155 Malignant neoplasm of lower third of esophagus: Secondary | ICD-10-CM | POA: Diagnosis present

## 2017-07-09 DIAGNOSIS — Z923 Personal history of irradiation: Secondary | ICD-10-CM | POA: Diagnosis not present

## 2017-07-09 NOTE — Progress Notes (Signed)
Radiation Oncology Follow up Note  Name: [name]   Date:   07/09/2017 MRN:  [id number] DOB: [birth year]    This 83 y.o. female presents to the clinic today for one-month follow-up status post radiation therapy to her distal esophagus for stage IV adenocarcinoma of the esophagus.  REFERRING PROVIDER: Tonia Ghent, MD  HPI: Patient is an 83 year old female now 1 month out having received palliative radiation therapy to her esophagus for distal esophageal adenocarcinoma. She seen today in routine follow-up is doing well. She continues to have no dysphagia weight is stable.. She recently had a PET CT scan showing marked improvement with dramatic reduction in activity the distal esophageal mass also marked resolution of multiple prior hepatic lesions. She is currently on FOLFOX chemotherapy and tolerating that well except for recent episode of neutropenia.  COMPLICATIONS OF TREATMENT: none  FOLLOW UP COMPLIANCE: keeps appointments   PHYSICAL EXAM:  There were no vitals taken for this visit. Well-developed well-nourished patient in NAD. HEENT reveals PERLA, EOMI, discs not visualized.  Oral cavity is clear. No oral mucosal lesions are identified. Neck is clear without evidence of cervical or supraclavicular adenopathy. Lungs are clear to A&P. Cardiac examination is essentially unremarkable with regular rate and rhythm without murmur rub or thrill. Abdomen is benign with no organomegaly or masses noted. Motor sensory and DTR levels are equal and symmetric in the upper and lower extremities. Cranial nerves II through XII are grossly intact. Proprioception is intact. No peripheral adenopathy or edema is identified. No motor or sensory levels are noted. Crude visual fields are within normal range.  RADIOLOGY RESULTS: PET CT scan reviewed and compatible with the above-stated findings  PLAN: Present time patient is doing extremely well with excellent response to both chemotherapy and radiation  therapy. I am please were overall progress. I've asked to see her back in 4 months for follow-up. She continues treatment with medical oncology. Patient is to call with any concerns. I would like to take this opportunity to thank you for allowing me to participate in the care of your patient.Noreene Filbert, MD

## 2017-07-15 ENCOUNTER — Inpatient Hospital Stay: Payer: Medicare Other

## 2017-07-15 ENCOUNTER — Encounter: Payer: Self-pay | Admitting: Oncology

## 2017-07-15 ENCOUNTER — Inpatient Hospital Stay: Payer: Medicare Other | Attending: Oncology | Admitting: Oncology

## 2017-07-15 ENCOUNTER — Other Ambulatory Visit: Payer: Self-pay

## 2017-07-15 VITALS — BP 134/79 | HR 59 | Temp 98.5°F | Wt 218.2 lb

## 2017-07-15 DIAGNOSIS — D509 Iron deficiency anemia, unspecified: Secondary | ICD-10-CM | POA: Diagnosis not present

## 2017-07-15 DIAGNOSIS — R5381 Other malaise: Secondary | ICD-10-CM | POA: Diagnosis not present

## 2017-07-15 DIAGNOSIS — C787 Secondary malignant neoplasm of liver and intrahepatic bile duct: Secondary | ICD-10-CM | POA: Diagnosis not present

## 2017-07-15 DIAGNOSIS — C155 Malignant neoplasm of lower third of esophagus: Secondary | ICD-10-CM

## 2017-07-15 DIAGNOSIS — Z9989 Dependence on other enabling machines and devices: Secondary | ICD-10-CM | POA: Insufficient documentation

## 2017-07-15 DIAGNOSIS — K209 Esophagitis, unspecified: Secondary | ICD-10-CM | POA: Diagnosis not present

## 2017-07-15 DIAGNOSIS — G4733 Obstructive sleep apnea (adult) (pediatric): Secondary | ICD-10-CM | POA: Insufficient documentation

## 2017-07-15 DIAGNOSIS — T451X5S Adverse effect of antineoplastic and immunosuppressive drugs, sequela: Secondary | ICD-10-CM | POA: Diagnosis not present

## 2017-07-15 DIAGNOSIS — J449 Chronic obstructive pulmonary disease, unspecified: Secondary | ICD-10-CM

## 2017-07-15 DIAGNOSIS — M1990 Unspecified osteoarthritis, unspecified site: Secondary | ICD-10-CM | POA: Insufficient documentation

## 2017-07-15 DIAGNOSIS — Z800 Family history of malignant neoplasm of digestive organs: Secondary | ICD-10-CM | POA: Insufficient documentation

## 2017-07-15 DIAGNOSIS — Z9221 Personal history of antineoplastic chemotherapy: Secondary | ICD-10-CM

## 2017-07-15 DIAGNOSIS — Z5111 Encounter for antineoplastic chemotherapy: Secondary | ICD-10-CM

## 2017-07-15 DIAGNOSIS — D701 Agranulocytosis secondary to cancer chemotherapy: Secondary | ICD-10-CM | POA: Diagnosis not present

## 2017-07-15 DIAGNOSIS — Z79899 Other long term (current) drug therapy: Secondary | ICD-10-CM

## 2017-07-15 DIAGNOSIS — Z8052 Family history of malignant neoplasm of bladder: Secondary | ICD-10-CM | POA: Insufficient documentation

## 2017-07-15 DIAGNOSIS — Z923 Personal history of irradiation: Secondary | ICD-10-CM | POA: Insufficient documentation

## 2017-07-15 DIAGNOSIS — R634 Abnormal weight loss: Secondary | ICD-10-CM

## 2017-07-15 DIAGNOSIS — I10 Essential (primary) hypertension: Secondary | ICD-10-CM | POA: Diagnosis not present

## 2017-07-15 DIAGNOSIS — E701 Other hyperphenylalaninemias: Secondary | ICD-10-CM

## 2017-07-15 DIAGNOSIS — Z87891 Personal history of nicotine dependence: Secondary | ICD-10-CM | POA: Insufficient documentation

## 2017-07-15 DIAGNOSIS — D500 Iron deficiency anemia secondary to blood loss (chronic): Secondary | ICD-10-CM

## 2017-07-15 DIAGNOSIS — R5383 Other fatigue: Secondary | ICD-10-CM | POA: Diagnosis not present

## 2017-07-15 DIAGNOSIS — G629 Polyneuropathy, unspecified: Secondary | ICD-10-CM

## 2017-07-15 DIAGNOSIS — R011 Cardiac murmur, unspecified: Secondary | ICD-10-CM | POA: Diagnosis not present

## 2017-07-15 DIAGNOSIS — Z801 Family history of malignant neoplasm of trachea, bronchus and lung: Secondary | ICD-10-CM | POA: Insufficient documentation

## 2017-07-15 DIAGNOSIS — Z803 Family history of malignant neoplasm of breast: Secondary | ICD-10-CM | POA: Diagnosis not present

## 2017-07-15 DIAGNOSIS — K5900 Constipation, unspecified: Secondary | ICD-10-CM | POA: Insufficient documentation

## 2017-07-15 DIAGNOSIS — Z808 Family history of malignant neoplasm of other organs or systems: Secondary | ICD-10-CM | POA: Insufficient documentation

## 2017-07-15 DIAGNOSIS — Z806 Family history of leukemia: Secondary | ICD-10-CM | POA: Diagnosis not present

## 2017-07-15 LAB — CBC WITH DIFFERENTIAL/PLATELET
Basophils Absolute: 0 10*3/uL (ref 0–0.1)
Basophils Relative: 1 %
EOS ABS: 0.2 10*3/uL (ref 0–0.7)
Eosinophils Relative: 9 %
HEMATOCRIT: 36.3 % (ref 35.0–47.0)
HEMOGLOBIN: 12 g/dL (ref 12.0–16.0)
LYMPHS ABS: 0.3 10*3/uL — AB (ref 1.0–3.6)
LYMPHS PCT: 17 %
MCH: 30.9 pg (ref 26.0–34.0)
MCHC: 33.2 g/dL (ref 32.0–36.0)
MCV: 93.2 fL (ref 80.0–100.0)
MONOS PCT: 20 %
Monocytes Absolute: 0.4 10*3/uL (ref 0.2–0.9)
NEUTROS ABS: 1 10*3/uL — AB (ref 1.4–6.5)
NEUTROS PCT: 53 %
Platelets: 116 10*3/uL — ABNORMAL LOW (ref 150–440)
RBC: 3.89 MIL/uL (ref 3.80–5.20)
RDW: 20 % — ABNORMAL HIGH (ref 11.5–14.5)
WBC: 1.9 10*3/uL — ABNORMAL LOW (ref 3.6–11.0)

## 2017-07-15 LAB — COMPREHENSIVE METABOLIC PANEL
ALT: 17 U/L (ref 14–54)
AST: 36 U/L (ref 15–41)
Albumin: 3.2 g/dL — ABNORMAL LOW (ref 3.5–5.0)
Alkaline Phosphatase: 78 U/L (ref 38–126)
Anion gap: 9 (ref 5–15)
BUN: 10 mg/dL (ref 6–20)
CHLORIDE: 107 mmol/L (ref 101–111)
CO2: 26 mmol/L (ref 22–32)
CREATININE: 0.63 mg/dL (ref 0.44–1.00)
Calcium: 8.7 mg/dL — ABNORMAL LOW (ref 8.9–10.3)
GFR calc non Af Amer: >60 mL/min (ref >60)
Glucose, Bld: 132 mg/dL — ABNORMAL HIGH (ref 65–99)
POTASSIUM: 3.6 mmol/L (ref 3.5–5.1)
SODIUM: 142 mmol/L (ref 135–145)
Total Bilirubin: 0.6 mg/dL (ref 0.3–1.2)
Total Protein: 6.3 g/dL — ABNORMAL LOW (ref 6.5–8.1)

## 2017-07-15 MED ORDER — SODIUM CHLORIDE 0.9 % IV SOLN
2400.0000 mg/m2 | INTRAVENOUS | Status: DC
  Administered 2017-07-15: 5200 mg via INTRAVENOUS
  Filled 2017-07-15: qty 100

## 2017-07-15 MED ORDER — LEUCOVORIN CALCIUM INJECTION 350 MG: 400.0000 mg/m2 | Freq: Once | INTRAMUSCULAR | Status: DC

## 2017-07-15 MED ORDER — SODIUM CHLORIDE 0.9 % IV SOLN
INTRAVENOUS | Status: DC
  Administered 2017-07-15: 10:00:00 via INTRAVENOUS
  Filled 2017-07-15: qty 1000

## 2017-07-15 MED ORDER — LEUCOVORIN CALCIUM INJECTION 100 MG
20.0000 mg/m2 | Freq: Once | INTRAMUSCULAR | Status: AC
  Administered 2017-07-15: 44 mg via INTRAVENOUS
  Filled 2017-07-15: qty 2.2

## 2017-07-15 MED ORDER — PALONOSETRON HCL INJECTION 0.25 MG/5ML: 0.2500 mg | Freq: Once | INTRAVENOUS | Status: DC

## 2017-07-15 MED ORDER — DEXTROSE 5 % IV SOLN
Freq: Once | INTRAVENOUS | Status: DC
  Filled 2017-07-15: qty 1000

## 2017-07-15 NOTE — Progress Notes (Signed)
Hematology/Oncology Follow Up Note [facility]  [phone] [phone]  Patient Care Team: Tonia Ghent, MD as PCP - General (Family Medicine)   Name of the patient: [name]  [id number]  [birth year]   REASON FOR VISIT Follow up of management of esophageal adenocarcinoma and management of iron deficiency anemia.  HISTORY OF PRESENT ILLNESS Patient is a 83 year old female who has metastatic esophageal cancer with liver involvement.  Weight loss 20 pounds for the past year prior to diagnosis.  # EGD during admission showed partially obstructive esophageal mass at the distal part of esophagus. Biopsy was taken. Pathology showed esophageal adenocarcinoma. #US biopsy of liver lesion to proof distant metastasis.  Report family history of breast cancer, but she has been having routine mammograms.  # Lives alone. She's a former smoker.  # Molecular Testing:  #[employer] Cdx: No reportable alternations with companion diagnostic (CDx) claims.  MS stable Tumor mutation burden: Cannot be determined, CCND3 amplification: Equivocal.  Amended reports on 06/04/2017, Tumor mutational burden changed from can not be determined to "TMB -low" on the re-analyzed samples.  # HER 2 negative.   Current Treatment S/p 6 cycles of FOLFOX  INTERVAL HISTORY Patient presents for evaluation prior to Cycle 1 Maintenance 5-FU chemotherapy for esophageal adenocarcinoma. Today feeling well, without any new symptoms.  Reports feeling well, denies any nausea vomiting, or fever. She has pre-existing neuropathy of bilateral toes and fingertips, she feels neuropathy is a little worse than her baseline. Take Lyrica.   She has also gain 2 pounds during interval. Appetite is good. Mild fatigue     Review of systems Review of Systems  Constitutional: Positive for malaise/fatigue. Negative for chills, diaphoresis and fever.  HENT: Negative for hearing loss.   Eyes:  Negative for blurred vision and pain.       Dry eye  Respiratory: Negative for cough, hemoptysis and wheezing.   Cardiovascular: Negative for chest pain, claudication and leg swelling.  Gastrointestinal: Negative for abdominal pain, constipation, diarrhea, heartburn, melena, nausea and vomiting.          Genitourinary: Negative for dysuria, flank pain and urgency.  Musculoskeletal: Negative for back pain, myalgias and neck pain.  Skin: Negative for rash.  Neurological: Positive for tingling and sensory change. Negative for dizziness and tremors.  Endo/Heme/Allergies: Does not bruise/bleed easily.  Psychiatric/Behavioral: Negative for depression. The patient is not nervous/anxious.     Allergies  Allergen Reactions  . Cefuroxime Nausea Only  . Codeine Nausea Only  . Doxycycline Other (See Comments)    Lip swelling  . Gabapentin Other (See Comments)    Swelling.   . Iohexol Itching    07-15-13 pt developed itching on fingers after contrast given. Dr. Irish Elders looked at pt and said to put in system as allergy. BB  . Other Other (See Comments)    Contrast Dye- caused fever, chills, awful feeling Bandages - itching, rash  . Oxycodone Other (See Comments)    nausea  . Quinolones Swelling    Lip swelling  . Synvisc [Hylan G-F 20] Swelling     Past Medical History:  Diagnosis Date  . Arthritis   . Asthma   . COPD (chronic obstructive pulmonary disease) ([place])   . Dysphonia   . Dyspnea   . Esophageal cancer ([place])   . Heart murmur   . History of kidney stones   . Hypertension   . Melanoma ([place])   . OAB (overactive bladder)   . OSA  on CPAP   . 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 Personal history of chemotherapy    esophageal cancer  . Personal history of radiation therapy    esophageal cancer  . Pupil asymmetry    R pupil defect  . Skin cancer   . Sleep apnea   . Spinal cord abscess      Past Surgical History:  Procedure Laterality Date  . ABDOMINAL  HYSTERECTOMY    . ANTERIOR CERVICAL DECOMP/DISCECTOMY FUSION N/A 07/16/2013   Procedure: ANTERIOR CERVICAL DECOMPRESSION/DISCECTOMY FUSION mutlipleLEVELS C4-7;  Surgeon: Erline Levine, MD;  Location: [place] [facility];  Service: Neurosurgery;  Laterality: N/A;  . APPENDECTOMY    . carpel tunn Bilateral   . CATARACT EXTRACTION    . CATARACT EXTRACTION, BILATERAL    . CHOLECYSTECTOMY    . dental implant    . ESOPHAGOGASTRODUODENOSCOPY Left 03/21/2017   Procedure: ESOPHAGOGASTRODUODENOSCOPY (EGD);  Surgeon: Virgel Manifold, MD;  Location[facility] END[facility];  Service: Endoscopy;  Laterality: Left;  Marland Kitchen GASTRIC BYPASS    . HIATAL HERNIA REPAIR    . MELANOMA EXCISION    . PORTA CATH INSERTION N/A 04/07/2017   Procedure: PORTA CATH INSERTION;  Surgeon: Algernon Huxley, MD;  Location: [place] CV LAB;  Service: Cardiovascular;  Laterality: N/A;  . POSTERIOR CERVICAL FUSION/FORAMINOTOMY N/A 07/28/2013   Procedure: Cervical four-seven  posterior cervical fusion;  Surgeon: Erline Levine, MD;  Location: [place] [facility] OR[facility];  Service: Neurosurgery;  Laterality: N/A;  . TONSILLECTOMY    . TOTAL KNEE ARTHROPLASTY      Social History   Socioeconomic History  . Marital status: Widowed    Spouse name: Not on file  . Number of children: 2  . Years of education: Not on file  . Highest education level: Master's degree (e.g., MA, MS, MEng, MEd, MSW, MBA)  Social Needs  . Financial resource strain: Not hard at all  . Food insecurity - worry: Never true  . Food insecurity - inability: Never true  . Transportation needs - medical: No  . Transportation needs - non-medical: No  Occupational History  . Not on file  Tobacco Use  . Smoking status: Former Smoker    Packs/day: 1.00    Years: 20.00    Pack years: 20.00    Types: Cigarettes    Last attempt to quit: 1977    Years since quitting: 42.2  . Smokeless tobacco: Never Used  Substance and Sexual Activity  . Alcohol use: No  . Drug use: No  . Sexual activity:  No  Other Topics Concern  . Not on file  Social History Narrative   Marital Status- Widowed    Lives by herself    Employement- Retired Pharmacist, hospital   Exercise hx- Does PT     Family History  Problem Relation Age of Onset  . Breast cancer Mother 10  . Arthritis-Osteo Mother   . Breast cancer Sister 45  . Bladder Cancer Sister   . Bladder Cancer Father   . Tongue cancer Father   . Congenital heart disease Father   . Prostate cancer Brother   . Uterine cancer Maternal Aunt   . Lung cancer Paternal Uncle   . Leukemia Maternal Grandmother   . Liver cancer Paternal Grandmother   . Colon cancer Neg Hx      Current Outpatient Medications:  .  acetaminophen (TYLENOL) 325 MG tablet, Take 650 mg by mouth every 6 (six) hours as needed for moderate  pain or headache. , Disp: , Rfl:  .  ALBUTEROL SULFATE HFA IN, Inhale into the lungs., Disp: , Rfl:  .  bisacodyl (DULCOLAX) 10 MG suppository, Place rectally., Disp: , Rfl:  .  calcium carbonate (TUMS EX) 750 MG chewable tablet, Chew by mouth., Disp: , Rfl:  .  Cholecalciferol (VITAMIN D) 2000 UNITS CAPS, Take 2,000 Units by mouth daily., Disp: , Rfl:  .  cyanocobalamin 500 MCG tablet, Take 500 mcg every other day by mouth., Disp: , Rfl:  .  dexamethasone (DECADRON) 4 MG tablet, Take 2 tablets (8 mg total) by mouth daily. Start the day after chemotherapy for 2 days. Take with food., Disp: 30 tablet, Rfl: 1 .  Diclofenac Sodium 1 % CREA, Apply to left arm as needed for pain three times daily, Disp: 120 g, Rfl: 0 .  fluticasone (FLONASE) 50 MCG/ACT nasal spray, Place 1-2 sprays into both nostrils daily., Disp: 16 g, Rfl: 5 .  hydrocortisone cream 1 %, Apply 1 application topically daily as needed for itching. , Disp: , Rfl:  .  lidocaine-prilocaine (EMLA) cream, Apply to affected area once, Disp: 30 g, Rfl: 3 .  loperamide (IMODIUM) 2 MG capsule, Take 1 capsule (2 mg total) by mouth See admin instructions. With onset of loose stool, take 60m followed  by 224mevery 2 hours until 12 hours have passed without loose bowel movement. Maximum: 16 mg/day, Disp: 120 capsule, Rfl: 0 .  loratadine (CLARITIN) 10 MG tablet, Take 10 mg by mouth daily., Disp: , Rfl:  .  LYRICA 150 MG capsule, TAKE 1 CAPSULE BY MOUTH TWO TIMES DAILY, Disp: 180 capsule, Rfl: 1 .  metoprolol succinate (TOPROL-XL) 50 MG 24 hr tablet, TAKE 1 TABLET BY MOUTH DAILY, Disp: 90 tablet, Rfl: 3 .  ondansetron (ZOFRAN) 8 MG tablet, Take 1 tablet (8 mg total) by mouth 2 (two) times daily as needed for refractory nausea / vomiting. Start on day 3 after chemotherapy., Disp: 30 tablet, Rfl: 1 .  oxybutynin (DITROPAN) 5 MG tablet, TAKE 1/2 TABLET BY MOUTH IN THE MORNING AND 1 TABLET AT BEDTIME, Disp: , Rfl:  .  pantoprazole (PROTONIX) 40 MG tablet, Take 1 tablet (40 mg total) by mouth daily., Disp: 30 tablet, Rfl: 1 .  Pediatric Multiple Vitamins (FLINTSTONES MULTIVITAMIN PO), Take by mouth., Disp: , Rfl:  .  Polyethyl Glycol-Propyl Glycol (SYSTANE OP), Apply 1 drop to eye daily as needed (dry eyes)., Disp: , Rfl:  .  prochlorperazine (COMPAZINE) 10 MG tablet, Take 1 tablet (10 mg total) by mouth every 6 (six) hours as needed (Nausea or vomiting)., Disp: 30 tablet, Rfl: 1 .  pyridOXINE (VITAMIN B-6) 100 MG tablet, Take 1 tablet (100 mg total) by mouth daily., Disp: 30 tablet, Rfl: 3 .  senna-docusate (SENOKOT-S) 8.6-50 MG tablet, Take 1 tablet at bedtime as needed by mouth for mild constipation., Disp: , Rfl:  .  sucralfate (CARAFATE) 1 g tablet, Take 1 tablet (1 g total) by mouth 3 (three) times daily before meals., Disp: 90 tablet, Rfl: 4 .  traMADol (ULTRAM) 50 MG tablet, Take 1 tablet (50 mg total) by mouth 3 (three) times daily as needed for moderate pain., Disp: 30 tablet, Rfl: 1 .  traZODone (DESYREL) 50 MG tablet, TAKE 1/2 TABLET BY MOUTH AT BEDTIME IF NEEDED FOR SLEEP, Disp: 45 tablet, Rfl: 3  Physical exam:  Vitals:   07/15/17 0934  BP: 134/79  Pulse: (!) 59  Temp: 98.5 F (36.9  C)  TempSrc: Tympanic  Weight: 218 lb 3 oz (99 kg)   Physical Exam  Constitutional: She is oriented to person, place, and time and well-developed, well-nourished, and in no distress. No distress.  HENT:  Head: Normocephalic and atraumatic.  Mouth/Throat: Oropharynx is clear and moist. No oropharyngeal exudate.  Eyes: Conjunctivae and EOM are normal. Pupils are equal, round, and reactive to light. Left eye exhibits no discharge. No scleral icterus.  Neck: Normal range of motion. Neck supple. No JVD present.  Cardiovascular: Normal rate and regular rhythm.  Murmur heard. Pulmonary/Chest: Effort normal and breath sounds normal. No respiratory distress. She has no wheezes. She exhibits no tenderness.  Abdominal: Soft. Bowel sounds are normal. She exhibits no distension. There is no tenderness. There is no guarding.  Musculoskeletal: Normal range of motion. She exhibits no edema or deformity.  Lymphadenopathy:    She has no cervical adenopathy.  Neurological: She is alert and oriented to person, place, and time. No cranial nerve deficit.  Skin: Skin is warm and dry. No erythema.  Psychiatric: Affect and judgment normal.    CMP Latest Ref Rng & Units 07/01/2017  Glucose 65 - 99 mg/dL 112(H)  BUN 6 - 20 mg/dL 12  Creatinine 0.44 - 1.00 mg/dL 0.82  Sodium 135 - 145 mmol/L 141  Potassium 3.5 - 5.1 mmol/L 3.9  Chloride 101 - 111 mmol/L 103  CO2 22 - 32 mmol/L 29  Calcium 8.9 - 10.3 mg/dL 9.0  Total Protein 6.5 - 8.1 g/dL 6.7  Total Bilirubin 0.3 - 1.2 mg/dL 0.6  Alkaline Phos 38 - 126 U/L 77  AST 15 - 41 U/L 39  ALT 14 - 54 U/L 15   CBC Latest Ref Rng & Units 07/15/2017  WBC 3.6 - 11.0 K/uL 1.9(L)  Hemoglobin 12.0 - 16.0 g/dL 12.0  Hematocrit 35.0 - 47.0 % 36.3  Platelets 150 - 440 K/uL 116(L)    Assessment and plan  Cancer Staging Malignant neoplasm of lower third of esophagus (HCC) Staging form: Esophagus - Adenocarcinoma, AJCC 8th Edition - Clinical stage from 04/10/2017:  Stage IVB (cTX, cNX, pM1) - Signed by Earlie Server, MD on 04/10/2017  1. Malignant neoplasm of lower third of esophagus (HCC)   2. Encounter for antineoplastic chemotherapy   3. Iron deficiency anemia due to chronic blood loss   4. Neuropathy   5. Murmur, cardiac    # s/p 6 cycles of FOLFOX due to neutropenia. tumor marker is trending down.PET scan showed excellent response from both chemotherapy and radiation, result was discussed with patient.   #Neutropenia secondary to chemotherapy/Radiation. ANC 1, total WBC 1.9.  Plan omit 5-FU bolus today and just proceed with infusion 5-FU. Discussed with patient.  Repeat CBC in 1 week.    # Iron deficiency anemia: s/p 4 doses of IV venofer. Ferritin has improved. Hold additional IV iron. I told patient that she can stop taking oral iron tablets.  # Esophagitis:Symptoms improved.   # Pre-existing peripheral neuropathy: continue Lyrica and B6 supplementation #Heart murmur advised patient to establish care with cardiology, she has got an appointment to see cardio this month.   Follow up in 2week for assessment prior to next cycle of treatment.  Patient knows to call if any concerns or questions.   Earlie Server, MD, PhD Hematology Oncology [facility] Pager- [phone] 07/15/17

## 2017-07-15 NOTE — Progress Notes (Signed)
Patient here today for follow up.  Patient states no new concerns today  

## 2017-07-17 ENCOUNTER — Inpatient Hospital Stay: Payer: Medicare Other

## 2017-07-17 VITALS — BP 141/80 | HR 66 | Resp 20

## 2017-07-17 DIAGNOSIS — Z5111 Encounter for antineoplastic chemotherapy: Secondary | ICD-10-CM | POA: Diagnosis not present

## 2017-07-17 DIAGNOSIS — C155 Malignant neoplasm of lower third of esophagus: Secondary | ICD-10-CM

## 2017-07-17 MED ORDER — HEPARIN SOD (PORK) LOCK FLUSH 100 UNIT/ML IV SOLN
500.0000 [IU] | Freq: Once | INTRAVENOUS | Status: AC | PRN
  Administered 2017-07-17: 500 [IU]

## 2017-07-21 ENCOUNTER — Telehealth: Payer: Self-pay

## 2017-07-21 NOTE — Telephone Encounter (Signed)
Nutrition  Patient with stage IV esophageal cancer with liver mets.  Patient receiving chemotherapy. Radiation completed on 06/09/17.    Called patient for nutrition follow-up. Patient reports appetite is some better after starting marinol.  Reports can only drink about 1 nutritional shake per day due to sweetness.  "I think my weight has stabilized."  Reports that she is really trying to work on eating more meat and good sources of protein at every meal.    Weight is 218 lb 3 oz noted on 3/5 slight decrease form 219 lb 8 oz on 2/25.    Nutrition Diagnosis:  Inadequate oral intake continues  Intervention:   Encouraged patient to continue to focus on high calorie, high protein foods. Encouraged continued [place] of oral nutrition supplements.   Patient to continue with marinol for appetite stimulation.  Next visit: as needed  Lavontae Cornia B. Zenia Resides, [place], [place] Registered Dietitian [phone] (pager)

## 2017-07-22 ENCOUNTER — Inpatient Hospital Stay: Payer: Medicare Other

## 2017-07-22 DIAGNOSIS — Z5111 Encounter for antineoplastic chemotherapy: Secondary | ICD-10-CM | POA: Diagnosis not present

## 2017-07-22 DIAGNOSIS — C155 Malignant neoplasm of lower third of esophagus: Secondary | ICD-10-CM

## 2017-07-22 LAB — CBC WITH DIFFERENTIAL/PLATELET
BASOS PCT: 1 %
Basophils Absolute: 0 10*3/uL (ref 0–0.1)
EOS ABS: 0.2 10*3/uL (ref 0–0.7)
Eosinophils Relative: 7 %
HCT: 36.3 % (ref 35.0–47.0)
Hemoglobin: 12 g/dL (ref 12.0–16.0)
Lymphocytes Relative: 14 %
Lymphs Abs: 0.4 10*3/uL — ABNORMAL LOW (ref 1.0–3.6)
MCH: 31 pg (ref 26.0–34.0)
MCHC: 33.2 g/dL (ref 32.0–36.0)
MCV: 93.6 fL (ref 80.0–100.0)
MONO ABS: 0.5 10*3/uL (ref 0.2–0.9)
MONOS PCT: 17 %
Neutro Abs: 1.7 10*3/uL (ref 1.4–6.5)
Neutrophils Relative %: 61 %
Platelets: 130 10*3/uL — ABNORMAL LOW (ref 150–440)
RBC: 3.88 MIL/uL (ref 3.80–5.20)
RDW: 19.7 % — ABNORMAL HIGH (ref 11.5–14.5)
WBC: 2.8 10*3/uL — ABNORMAL LOW (ref 3.6–11.0)

## 2017-07-22 LAB — COMPREHENSIVE METABOLIC PANEL
ALBUMIN: 3.1 g/dL — AB (ref 3.5–5.0)
ALK PHOS: 78 U/L (ref 38–126)
ALT: 28 U/L (ref 14–54)
AST: 46 U/L — ABNORMAL HIGH (ref 15–41)
Anion gap: 7 (ref 5–15)
BUN: 12 mg/dL (ref 6–20)
CALCIUM: 8.7 mg/dL — AB (ref 8.9–10.3)
CO2: 29 mmol/L (ref 22–32)
CREATININE: 0.69 mg/dL (ref 0.44–1.00)
Chloride: 104 mmol/L (ref 101–111)
GFR calc Af Amer: >60 mL/min (ref >60)
GFR calc non Af Amer: >60 mL/min (ref >60)
GLUCOSE: 125 mg/dL — AB (ref 65–99)
Potassium: 3.8 mmol/L (ref 3.5–5.1)
SODIUM: 140 mmol/L (ref 135–145)
Total Bilirubin: 0.5 mg/dL (ref 0.3–1.2)
Total Protein: 6.1 g/dL — ABNORMAL LOW (ref 6.5–8.1)

## 2017-07-29 ENCOUNTER — Inpatient Hospital Stay (HOSPITAL_BASED_OUTPATIENT_CLINIC_OR_DEPARTMENT_OTHER): Payer: Medicare Other | Admitting: Oncology

## 2017-07-29 ENCOUNTER — Inpatient Hospital Stay: Payer: Medicare Other

## 2017-07-29 ENCOUNTER — Encounter: Payer: Self-pay | Admitting: Oncology

## 2017-07-29 ENCOUNTER — Other Ambulatory Visit: Payer: Self-pay

## 2017-07-29 VITALS — BP 155/95 | HR 69 | Temp 97.8°F | Resp 18 | Wt 218.5 lb

## 2017-07-29 DIAGNOSIS — R5383 Other fatigue: Secondary | ICD-10-CM | POA: Diagnosis not present

## 2017-07-29 DIAGNOSIS — J449 Chronic obstructive pulmonary disease, unspecified: Secondary | ICD-10-CM | POA: Diagnosis not present

## 2017-07-29 DIAGNOSIS — Z8052 Family history of malignant neoplasm of bladder: Secondary | ICD-10-CM

## 2017-07-29 DIAGNOSIS — T451X5S Adverse effect of antineoplastic and immunosuppressive drugs, sequela: Secondary | ICD-10-CM

## 2017-07-29 DIAGNOSIS — I10 Essential (primary) hypertension: Secondary | ICD-10-CM

## 2017-07-29 DIAGNOSIS — Z79899 Other long term (current) drug therapy: Secondary | ICD-10-CM

## 2017-07-29 DIAGNOSIS — D701 Agranulocytosis secondary to cancer chemotherapy: Secondary | ICD-10-CM

## 2017-07-29 DIAGNOSIS — Z808 Family history of malignant neoplasm of other organs or systems: Secondary | ICD-10-CM

## 2017-07-29 DIAGNOSIS — Z806 Family history of leukemia: Secondary | ICD-10-CM

## 2017-07-29 DIAGNOSIS — Z800 Family history of malignant neoplasm of digestive organs: Secondary | ICD-10-CM

## 2017-07-29 DIAGNOSIS — Z801 Family history of malignant neoplasm of trachea, bronchus and lung: Secondary | ICD-10-CM

## 2017-07-29 DIAGNOSIS — C155 Malignant neoplasm of lower third of esophagus: Secondary | ICD-10-CM | POA: Diagnosis not present

## 2017-07-29 DIAGNOSIS — Z5111 Encounter for antineoplastic chemotherapy: Secondary | ICD-10-CM

## 2017-07-29 DIAGNOSIS — C787 Secondary malignant neoplasm of liver and intrahepatic bile duct: Secondary | ICD-10-CM

## 2017-07-29 DIAGNOSIS — Z803 Family history of malignant neoplasm of breast: Secondary | ICD-10-CM

## 2017-07-29 DIAGNOSIS — Z923 Personal history of irradiation: Secondary | ICD-10-CM

## 2017-07-29 DIAGNOSIS — R634 Abnormal weight loss: Secondary | ICD-10-CM | POA: Diagnosis not present

## 2017-07-29 DIAGNOSIS — K209 Esophagitis, unspecified: Secondary | ICD-10-CM

## 2017-07-29 DIAGNOSIS — G629 Polyneuropathy, unspecified: Secondary | ICD-10-CM

## 2017-07-29 DIAGNOSIS — K5900 Constipation, unspecified: Secondary | ICD-10-CM | POA: Diagnosis not present

## 2017-07-29 DIAGNOSIS — R5381 Other malaise: Secondary | ICD-10-CM | POA: Diagnosis not present

## 2017-07-29 DIAGNOSIS — D509 Iron deficiency anemia, unspecified: Secondary | ICD-10-CM

## 2017-07-29 DIAGNOSIS — G4733 Obstructive sleep apnea (adult) (pediatric): Secondary | ICD-10-CM

## 2017-07-29 DIAGNOSIS — Z9221 Personal history of antineoplastic chemotherapy: Secondary | ICD-10-CM

## 2017-07-29 DIAGNOSIS — R011 Cardiac murmur, unspecified: Secondary | ICD-10-CM

## 2017-07-29 DIAGNOSIS — Z87891 Personal history of nicotine dependence: Secondary | ICD-10-CM

## 2017-07-29 DIAGNOSIS — Z9989 Dependence on other enabling machines and devices: Secondary | ICD-10-CM

## 2017-07-29 DIAGNOSIS — D500 Iron deficiency anemia secondary to blood loss (chronic): Secondary | ICD-10-CM

## 2017-07-29 DIAGNOSIS — M1990 Unspecified osteoarthritis, unspecified site: Secondary | ICD-10-CM

## 2017-07-29 LAB — CBC WITH DIFFERENTIAL/PLATELET
BASOS ABS: 0 10*3/uL (ref 0–0.1)
Basophils Relative: 1 %
Eosinophils Absolute: 0.1 10*3/uL (ref 0–0.7)
Eosinophils Relative: 3 %
HEMATOCRIT: 37.8 % (ref 35.0–47.0)
Hemoglobin: 12.7 g/dL (ref 12.0–16.0)
Lymphocytes Relative: 10 %
Lymphs Abs: 0.5 10*3/uL — ABNORMAL LOW (ref 1.0–3.6)
MCH: 31.3 pg (ref 26.0–34.0)
MCHC: 33.5 g/dL (ref 32.0–36.0)
MCV: 93.4 fL (ref 80.0–100.0)
Monocytes Absolute: 0.8 10*3/uL (ref 0.2–0.9)
Monocytes Relative: 15 %
NEUTROS ABS: 3.6 10*3/uL (ref 1.4–6.5)
Neutrophils Relative %: 71 %
PLATELETS: 125 10*3/uL — AB (ref 150–440)
RBC: 4.05 MIL/uL (ref 3.80–5.20)
RDW: 20.6 % — ABNORMAL HIGH (ref 11.5–14.5)
WBC: 5 10*3/uL (ref 3.6–11.0)

## 2017-07-29 LAB — COMPREHENSIVE METABOLIC PANEL
ALT: 16 U/L (ref 14–54)
AST: 36 U/L (ref 15–41)
Albumin: 3.3 g/dL — ABNORMAL LOW (ref 3.5–5.0)
Alkaline Phosphatase: 75 U/L (ref 38–126)
Anion gap: 9 (ref 5–15)
BILIRUBIN TOTAL: 0.8 mg/dL (ref 0.3–1.2)
BUN: 13 mg/dL (ref 6–20)
CHLORIDE: 104 mmol/L (ref 101–111)
CO2: 28 mmol/L (ref 22–32)
CREATININE: 0.66 mg/dL (ref 0.44–1.00)
Calcium: 9.1 mg/dL (ref 8.9–10.3)
GFR calc Af Amer: >60 mL/min (ref >60)
GLUCOSE: 101 mg/dL — AB (ref 65–99)
Potassium: 3.6 mmol/L (ref 3.5–5.1)
Sodium: 141 mmol/L (ref 135–145)
Total Protein: 6.5 g/dL (ref 6.5–8.1)

## 2017-07-29 MED ORDER — FLUOROURACIL CHEMO INJECTION 2.5 GM/50ML
400.0000 mg/m2 | Freq: Once | INTRAVENOUS | Status: AC
Start: 2017-07-29 — End: 2017-07-29
  Administered 2017-07-29: 850 mg via INTRAVENOUS
  Filled 2017-07-29: qty 17

## 2017-07-29 MED ORDER — LEUCOVORIN CALCIUM INJECTION 100 MG
20.0000 mg/m2 | Freq: Once | INTRAMUSCULAR | Status: AC
  Administered 2017-07-29: 44 mg via INTRAVENOUS
  Filled 2017-07-29: qty 2.2

## 2017-07-29 MED ORDER — SODIUM CHLORIDE 0.9 % IV SOLN
2400.0000 mg/m2 | INTRAVENOUS | Status: DC
  Administered 2017-07-29: 5200 mg via INTRAVENOUS
  Filled 2017-07-29: qty 104

## 2017-07-29 MED ORDER — LEUCOVORIN CALCIUM INJECTION 350 MG: 400.0000 mg/m2 | Freq: Once | INTRAVENOUS | Status: DC

## 2017-07-29 NOTE — Progress Notes (Signed)
Hematology/Oncology Follow Up Note [facility]  [phone] [phone]  Patient Care Team: Tonia Ghent, MD as PCP - General (Family Medicine)   Name of the patient: [name]  [id number]  Apr 27, 1935   REASON FOR VISIT Follow up of management of esophageal adenocarcinoma and management of iron deficiency anemia.  HISTORY OF PRESENT ILLNESS Patient is a 83 year old female who has metastatic esophageal cancer with liver involvement.  Weight loss 20 pounds for the past year prior to diagnosis.  # EGD during admission showed partially obstructive esophageal mass at the distal part of esophagus. Biopsy was taken. Pathology showed esophageal adenocarcinoma. #US biopsy of liver lesion to proof distant metastasis.  Report family history of breast cancer, but she has been having routine mammograms.  # Lives alone. She's a former smoker.  # Molecular Testing:  #[employer] Cdx: No reportable alternations with companion diagnostic (CDx) claims.  MS stable Tumor mutation burden: Cannot be determined, CCND3 amplification: Equivocal.  Amended reports on 06/04/2017, Tumor mutational burden changed from can not be determined to "TMB -low" on the re-analyzed samples.  # HER 2 negative.   Current Treatment S/p 6 cycles of FOLFOX, PET scan showed excellent partial response from both chemotherapy and radiation, result was discussed with patient.  currently on 5-FU maintenance.   INTERVAL HISTORY Patient presents for evaluation prior to Cycle 2 Maintenance 5-FU chemotherapy for esophageal adenocarcinoma. Today feeling well, without any new symptoms.  She has constipation which is relieved by Senokot plus as needed MiraLAX.  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  She takes Lyrica.  Weight has been stable since last visit. athy of bilateral toes and fingertips, she feels neuropathy is a little worse than her  baseline. Take Lyrica.   She has also gain 2 pounds during interval. Appetite is good. Mild fatigue     Review of systems Review of Systems  Constitutional: Positive for malaise/fatigue. Negative for chills, diaphoresis and fever.  HENT: Negative for hearing loss.   Eyes: Negative for blurred vision and pain.  Respiratory: Negative for cough, hemoptysis, sputum production and wheezing.   Cardiovascular: Negative for chest pain, orthopnea, claudication and leg swelling.  Gastrointestinal: Negative for abdominal pain, constipation, diarrhea, heartburn, melena, nausea and vomiting.          Genitourinary: Negative for dysuria, flank pain, frequency and urgency.  Musculoskeletal: Negative for back pain, joint pain, myalgias and neck pain.  Skin: Negative for rash.  Neurological: Positive for tingling and sensory change. Negative for dizziness and tremors.  Endo/Heme/Allergies: Does not bruise/bleed easily.  Psychiatric/Behavioral: Negative for depression and substance abuse. The patient is not nervous/anxious.     Allergies  Allergen Reactions  . Cefuroxime Nausea Only  . Codeine Nausea Only  . Doxycycline Other (See Comments)    Lip swelling  . Gabapentin Other (See Comments)    Swelling.   . Iohexol Itching    07-15-13 pt developed itching on fingers after contrast given. Dr. Irish Elders looked at pt and said to put in system as allergy. BB  . Other Other (See Comments)    Contrast Dye- caused fever, chills, awful feeling Bandages - itching, rash  . Oxycodone Other (See Comments)    nausea  . Quinolones Swelling    Lip swelling  . Synvisc [Hylan G-F 20] Swelling     Past Medical History:  Diagnosis Date  . Arthritis   . Asthma   . COPD (chronic obstructive pulmonary  disease) ([place])   . Dysphonia   . Dyspnea   . Esophageal cancer ([place])   . Heart murmur   . History of kidney stones   . Hypertension   . Melanoma ([place])   . OAB (overactive bladder)   . OSA on CPAP   .  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 Personal history of chemotherapy    esophageal cancer  . Personal history of radiation therapy    esophageal cancer  . Pupil asymmetry    R pupil defect  . Skin cancer   . Sleep apnea   . Spinal cord abscess      Past Surgical History:  Procedure Laterality Date  . ABDOMINAL HYSTERECTOMY    . ANTERIOR CERVICAL DECOMP/DISCECTOMY FUSION N/A 07/16/2013   Procedure: ANTERIOR CERVICAL DECOMPRESSION/DISCECTOMY FUSION mutlipleLEVELS C4-7;  Surgeon: Erline Levine, MD;  Location: [place] [facility]UR[facility] ORS;  Service: Neurosurgery;  Laterality: N/A;  . APPENDECTOMY    . carpel tunn Bilateral   . CATARACT EXTRACTION    . CATARACT EXTRACTION, BILATERAL    . CHOLECYSTECTOMY    . dental implant    . ESOPHAGOGASTRODUODENOSCOPY Left 03/21/2017   Procedure: ESOPHAGOGASTRODUODENOSCOPY (EGD);  Surgeon: Virgel Manifold, MD;  Location: [facility] ENDOSCOPY;  Service: Endoscopy;  Laterality: Left;  Marland Kitchen GASTRIC BYPASS    . HIATAL HERNIA REPAIR    . MELANOMA EXCISION    . PORTA CATH INSERTION N/A 04/07/2017   Procedure: PORTA CATH INSERTION;  Surgeon: Algernon Huxley, MD;  Location: [place] CV LAB;  Service: Cardiovascular;  Laterality: N/A;  . POSTERIOR CERVICAL FUSION/FORAMINOTOMY N/A 07/28/2013   Procedure: Cervical four-seven  posterior cervical fusion;  Surgeon: Erline Levine, MD;  Location: [place] NEURO ORS;  Service: Neurosurgery;  Laterality: N/A;  . TONSILLECTOMY    . TOTAL KNEE ARTHROPLASTY      Social History   Socioeconomic History  . Marital status: Widowed    Spouse name: Not on file  . Number of children: 2  . Years of education: Not on file  . Highest education level: Master's degree (e.g., MA, MS, MEng, MEd, MSW, MBA)  Social Needs  . Financial resource strain: Not hard at all  . Food insecurity - worry: Never true  . Food insecurity - inability: Never true  . Transportation needs - medical: No  . Transportation needs - non-medical: No    Occupational History  . Not on file  Tobacco Use  . Smoking status: Former Smoker    Packs/day: 1.00    Years: 20.00    Pack years: 20.00    Types: Cigarettes    Last attempt to quit: 1977    Years since quitting: 42.2  . Smokeless tobacco: Never Used  Substance and Sexual Activity  . Alcohol use: No  . Drug use: No  . Sexual activity: No  Other Topics Concern  . Not on file  Social History Narrative   Marital Status- Widowed    Lives by herself    Employement- Retired Pharmacist, hospital   Exercise hx- Does PT     Family History  Problem Relation Age of Onset  . Breast cancer Mother 52  . Arthritis-Osteo Mother   . Breast cancer Sister 5  . Bladder Cancer Sister   . Bladder Cancer Father   . Tongue cancer Father   . Congenital heart disease Father   . Prostate cancer Brother   . Uterine cancer Maternal Aunt   . Lung cancer Paternal Uncle   .  Leukemia Maternal Grandmother   . Liver cancer Paternal Grandmother   . Colon cancer Neg Hx      Current Outpatient Medications:  .  acetaminophen (TYLENOL) 325 MG tablet, Take 650 mg by mouth every 6 (six) hours as needed for moderate pain or headache. , Disp: , Rfl:  .  ALBUTEROL SULFATE HFA IN, Inhale into the lungs., Disp: , Rfl:  .  bisacodyl (DULCOLAX) 10 MG suppository, Place rectally., Disp: , Rfl:  .  calcium carbonate (TUMS EX) 750 MG chewable tablet, Chew by mouth., Disp: , Rfl:  .  Cholecalciferol (VITAMIN D) 2000 UNITS CAPS, Take 2,000 Units by mouth daily., Disp: , Rfl:  .  cyanocobalamin 500 MCG tablet, Take 500 mcg every other day by mouth., Disp: , Rfl:  .  dexamethasone (DECADRON) 4 MG tablet, Take 2 tablets (8 mg total) by mouth daily. Start the day after chemotherapy for 2 days. Take with food., Disp: 30 tablet, Rfl: 1 .  Diclofenac Sodium 1 % CREA, Apply to left arm as needed for pain three times daily, Disp: 120 g, Rfl: 0 .  fluticasone (FLONASE) 50 MCG/ACT nasal spray, Place 1-2 sprays into both nostrils daily.,  Disp: 16 g, Rfl: 5 .  hydrocortisone cream 1 %, Apply 1 application topically daily as needed for itching. , Disp: , Rfl:  .  lidocaine-prilocaine (EMLA) cream, Apply to affected area once, Disp: 30 g, Rfl: 3 .  loperamide (IMODIUM) 2 MG capsule, Take 1 capsule (2 mg total) by mouth See admin instructions. With onset of loose stool, take 84m followed by 237mevery 2 hours until 12 hours have passed without loose bowel movement. Maximum: 16 mg/day, Disp: 120 capsule, Rfl: 0 .  loratadine (CLARITIN) 10 MG tablet, Take 10 mg by mouth daily., Disp: , Rfl:  .  LYRICA 150 MG capsule, TAKE 1 CAPSULE BY MOUTH TWO TIMES DAILY, Disp: 180 capsule, Rfl: 1 .  metoprolol succinate (TOPROL-XL) 50 MG 24 hr tablet, TAKE 1 TABLET BY MOUTH DAILY, Disp: 90 tablet, Rfl: 3 .  ondansetron (ZOFRAN) 8 MG tablet, Take 1 tablet (8 mg total) by mouth 2 (two) times daily as needed for refractory nausea / vomiting. Start on day 3 after chemotherapy., Disp: 30 tablet, Rfl: 1 .  oxybutynin (DITROPAN) 5 MG tablet, TAKE 1/2 TABLET BY MOUTH IN THE MORNING AND 1 TABLET AT BEDTIME, Disp: , Rfl:  .  pantoprazole (PROTONIX) 40 MG tablet, Take 1 tablet (40 mg total) by mouth daily., Disp: 30 tablet, Rfl: 1 .  Pediatric Multiple Vitamins (FLINTSTONES MULTIVITAMIN PO), Take by mouth., Disp: , Rfl:  .  Polyethyl Glycol-Propyl Glycol (SYSTANE OP), Apply 1 drop to eye daily as needed (dry eyes)., Disp: , Rfl:  .  prochlorperazine (COMPAZINE) 10 MG tablet, Take 1 tablet (10 mg total) by mouth every 6 (six) hours as needed (Nausea or vomiting)., Disp: 30 tablet, Rfl: 1 .  pyridOXINE (VITAMIN B-6) 100 MG tablet, Take 1 tablet (100 mg total) by mouth daily., Disp: 30 tablet, Rfl: 3 .  senna-docusate (SENOKOT-S) 8.6-50 MG tablet, Take 1 tablet at bedtime as needed by mouth for mild constipation., Disp: , Rfl:  .  sucralfate (CARAFATE) 1 g tablet, Take 1 tablet (1 g total) by mouth 3 (three) times daily before meals., Disp: 90 tablet, Rfl: 4 .   traMADol (ULTRAM) 50 MG tablet, Take 1 tablet (50 mg total) by mouth 3 (three) times daily as needed for moderate pain., Disp: 30 tablet, Rfl: 1 .  traZODone (DESYREL) 50 MG tablet, TAKE 1/2 TABLET BY MOUTH AT BEDTIME IF NEEDED FOR SLEEP, Disp: 45 tablet, Rfl: 3  Physical exam:  Vitals:   07/29/17 0925  BP: (!) 155/95  Pulse: 69  Resp: 18  Temp: 97.8 F (36.6 C)  TempSrc: Tympanic  Weight: 218 lb 8 oz (99.1 kg)   Physical Exam  Constitutional: She is oriented to person, place, and time and well-developed, well-nourished, and in no distress. No distress.  HENT:  Head: Normocephalic and atraumatic.  Mouth/Throat: Oropharynx is clear and moist. No oropharyngeal exudate.  Eyes: Conjunctivae and EOM are normal. Pupils are equal, round, and reactive to light. Left eye exhibits no discharge. No scleral icterus.  Neck: Normal range of motion. Neck supple. No JVD present. No tracheal deviation present.  Cardiovascular: Normal rate and regular rhythm.  Murmur heard. Pulmonary/Chest: Effort normal and breath sounds normal. No respiratory distress. She has no wheezes. She exhibits no tenderness.  Abdominal: Soft. Bowel sounds are normal. She exhibits no distension. There is no tenderness. There is no guarding.  Musculoskeletal: Normal range of motion. She exhibits no edema or deformity.  Lymphadenopathy:    She has no cervical adenopathy.  Neurological: She is alert and oriented to person, place, and time. No cranial nerve deficit. Coordination normal.  Skin: Skin is warm and dry. No rash noted. She is not diaphoretic. No erythema.  Psychiatric: Memory, affect and judgment normal.    CMP Latest Ref Rng & Units 07/22/2017  Glucose 65 - 99 mg/dL 125(H)  BUN 6 - 20 mg/dL 12  Creatinine 0.44 - 1.00 mg/dL 0.69  Sodium 135 - 145 mmol/L 140  Potassium 3.5 - 5.1 mmol/L 3.8  Chloride 101 - 111 mmol/L 104  CO2 22 - 32 mmol/L 29  Calcium 8.9 - 10.3 mg/dL 8.7(L)  Total Protein 6.5 - 8.1 g/dL 6.1(L)    Total Bilirubin 0.3 - 1.2 mg/dL 0.5  Alkaline Phos 38 - 126 U/L 78  AST 15 - 41 U/L 46(H)  ALT 14 - 54 U/L 28   CBC Latest Ref Rng & Units 07/22/2017  WBC 3.6 - 11.0 K/uL 2.8(L)  Hemoglobin 12.0 - 16.0 g/dL 12.0  Hematocrit 35.0 - 47.0 % 36.3  Platelets 150 - 440 K/uL 130(L)    Assessment and plan  Cancer Staging Malignant neoplasm of lower third of esophagus (HCC) Staging form: Esophagus - Adenocarcinoma, AJCC 8th Edition - Clinical stage from 04/10/2017: Stage IVB (cTX, cNX, pM1) - Signed by Earlie Server, MD on 04/10/2017  1. Malignant neoplasm of lower third of esophagus (HCC)   2. Encounter for antineoplastic chemotherapy   3. Iron deficiency anemia due to chronic blood loss   4. Neuropathy    # s/p 6 cycles of FOLFOX due to neutropenia. tumor marker is trending down. #Neutropenia secondary to chemotherapy/Radiation.  Resolved. Proceed with cycle 2 maintenance 5-FU bolus and 48-hour infusion.  # Iron deficiency anemia: We will monitor her iron level periodically. # Esophagitis:Symptoms improved.   # Pre-existing peripheral neuropathy: continue Lyrica and B6 supplementation #Heart murmur advised patient to follow-up with cardiology   Follow up in 2week for assessment prior to next cycle of 5-FU maintenance treatment.  Patient knows to call if any concerns or questions.   Earlie Server, MD, PhD Hematology Oncology [facility] Pager- [phone] 07/29/17

## 2017-07-29 NOTE — Progress Notes (Signed)
Here for follow up. Overall doing well. Having diff swallowing w gagging and food "gets stuck" per pt

## 2017-07-30 LAB — CEA: CEA: 7 ng/mL — ABNORMAL HIGH (ref 0.0–4.7)

## 2017-07-31 ENCOUNTER — Inpatient Hospital Stay: Payer: Medicare Other

## 2017-07-31 VITALS — BP 153/82 | HR 72 | Temp 98.0°F | Resp 20

## 2017-07-31 DIAGNOSIS — Z5111 Encounter for antineoplastic chemotherapy: Secondary | ICD-10-CM | POA: Diagnosis not present

## 2017-07-31 DIAGNOSIS — C155 Malignant neoplasm of lower third of esophagus: Secondary | ICD-10-CM

## 2017-07-31 MED ORDER — HEPARIN SOD (PORK) LOCK FLUSH 100 UNIT/ML IV SOLN
500.0000 [IU] | Freq: Once | INTRAVENOUS | Status: AC | PRN
  Administered 2017-07-31: 500 [IU]
  Filled 2017-07-31: qty 5

## 2017-07-31 MED ORDER — SODIUM CHLORIDE 0.9% FLUSH
10.0000 mL | INTRAVENOUS | Status: DC | PRN
  Administered 2017-07-31: 10 mL
  Filled 2017-07-31: qty 10

## 2017-08-12 ENCOUNTER — Inpatient Hospital Stay: Payer: Medicare Other | Attending: Oncology

## 2017-08-12 ENCOUNTER — Inpatient Hospital Stay: Payer: Medicare Other

## 2017-08-12 ENCOUNTER — Inpatient Hospital Stay: Payer: Medicare Other | Admitting: Oncology

## 2017-08-12 ENCOUNTER — Encounter: Payer: Self-pay | Admitting: Oncology

## 2017-08-12 ENCOUNTER — Other Ambulatory Visit: Payer: Self-pay

## 2017-08-12 VITALS — BP 158/93 | HR 63 | Temp 98.3°F | Wt 220.2 lb

## 2017-08-12 VITALS — BP 154/81 | HR 55

## 2017-08-12 DIAGNOSIS — Z87891 Personal history of nicotine dependence: Secondary | ICD-10-CM | POA: Diagnosis not present

## 2017-08-12 DIAGNOSIS — Z803 Family history of malignant neoplasm of breast: Secondary | ICD-10-CM | POA: Insufficient documentation

## 2017-08-12 DIAGNOSIS — R5383 Other fatigue: Secondary | ICD-10-CM | POA: Diagnosis not present

## 2017-08-12 DIAGNOSIS — R634 Abnormal weight loss: Secondary | ICD-10-CM | POA: Diagnosis not present

## 2017-08-12 DIAGNOSIS — C787 Secondary malignant neoplasm of liver and intrahepatic bile duct: Secondary | ICD-10-CM | POA: Insufficient documentation

## 2017-08-12 DIAGNOSIS — Z79899 Other long term (current) drug therapy: Secondary | ICD-10-CM | POA: Insufficient documentation

## 2017-08-12 DIAGNOSIS — R5381 Other malaise: Secondary | ICD-10-CM

## 2017-08-12 DIAGNOSIS — Z8052 Family history of malignant neoplasm of bladder: Secondary | ICD-10-CM | POA: Diagnosis not present

## 2017-08-12 DIAGNOSIS — Z923 Personal history of irradiation: Secondary | ICD-10-CM | POA: Insufficient documentation

## 2017-08-12 DIAGNOSIS — R011 Cardiac murmur, unspecified: Secondary | ICD-10-CM

## 2017-08-12 DIAGNOSIS — J449 Chronic obstructive pulmonary disease, unspecified: Secondary | ICD-10-CM | POA: Insufficient documentation

## 2017-08-12 DIAGNOSIS — R1319 Other dysphagia: Secondary | ICD-10-CM

## 2017-08-12 DIAGNOSIS — Z5111 Encounter for antineoplastic chemotherapy: Secondary | ICD-10-CM | POA: Diagnosis not present

## 2017-08-12 DIAGNOSIS — D500 Iron deficiency anemia secondary to blood loss (chronic): Secondary | ICD-10-CM

## 2017-08-12 DIAGNOSIS — Z8042 Family history of malignant neoplasm of prostate: Secondary | ICD-10-CM | POA: Insufficient documentation

## 2017-08-12 DIAGNOSIS — C155 Malignant neoplasm of lower third of esophagus: Secondary | ICD-10-CM

## 2017-08-12 DIAGNOSIS — I10 Essential (primary) hypertension: Secondary | ICD-10-CM | POA: Insufficient documentation

## 2017-08-12 DIAGNOSIS — Z800 Family history of malignant neoplasm of digestive organs: Secondary | ICD-10-CM | POA: Diagnosis not present

## 2017-08-12 DIAGNOSIS — Z9989 Dependence on other enabling machines and devices: Secondary | ICD-10-CM

## 2017-08-12 DIAGNOSIS — G4733 Obstructive sleep apnea (adult) (pediatric): Secondary | ICD-10-CM | POA: Insufficient documentation

## 2017-08-12 DIAGNOSIS — Z806 Family history of leukemia: Secondary | ICD-10-CM

## 2017-08-12 DIAGNOSIS — Z808 Family history of malignant neoplasm of other organs or systems: Secondary | ICD-10-CM

## 2017-08-12 DIAGNOSIS — R1310 Dysphagia, unspecified: Secondary | ICD-10-CM | POA: Insufficient documentation

## 2017-08-12 DIAGNOSIS — G629 Polyneuropathy, unspecified: Secondary | ICD-10-CM

## 2017-08-12 LAB — COMPREHENSIVE METABOLIC PANEL
ALK PHOS: 69 U/L (ref 38–126)
ALT: 16 U/L (ref 14–54)
ANION GAP: 8 (ref 5–15)
AST: 35 U/L (ref 15–41)
Albumin: 3.4 g/dL — ABNORMAL LOW (ref 3.5–5.0)
BILIRUBIN TOTAL: 0.8 mg/dL (ref 0.3–1.2)
BUN: 12 mg/dL (ref 6–20)
CALCIUM: 9.1 mg/dL (ref 8.9–10.3)
CO2: 25 mmol/L (ref 22–32)
CREATININE: 0.57 mg/dL (ref 0.44–1.00)
Chloride: 108 mmol/L (ref 101–111)
GFR calc non Af Amer: >60 mL/min (ref >60)
Glucose, Bld: 81 mg/dL (ref 65–99)
Potassium: 3.5 mmol/L (ref 3.5–5.1)
SODIUM: 141 mmol/L (ref 135–145)
TOTAL PROTEIN: 6.4 g/dL — AB (ref 6.5–8.1)

## 2017-08-12 LAB — CBC WITH DIFFERENTIAL/PLATELET
BASOS PCT: 1 %
Basophils Absolute: 0 10*3/uL (ref 0–0.1)
EOS PCT: 6 %
Eosinophils Absolute: 0.2 10*3/uL (ref 0–0.7)
HCT: 35.5 % (ref 35.0–47.0)
Hemoglobin: 12 g/dL (ref 12.0–16.0)
LYMPHS PCT: 10 %
Lymphs Abs: 0.4 10*3/uL — ABNORMAL LOW (ref 1.0–3.6)
MCH: 32.3 pg (ref 26.0–34.0)
MCHC: 33.8 g/dL (ref 32.0–36.0)
MCV: 95.4 fL (ref 80.0–100.0)
MONO ABS: 0.5 10*3/uL (ref 0.2–0.9)
Monocytes Relative: 14 %
Neutro Abs: 2.7 10*3/uL (ref 1.4–6.5)
Neutrophils Relative %: 69 %
PLATELETS: 127 10*3/uL — AB (ref 150–440)
RBC: 3.72 MIL/uL — AB (ref 3.80–5.20)
RDW: 19.5 % — AB (ref 11.5–14.5)
WBC: 3.8 10*3/uL (ref 3.6–11.0)

## 2017-08-12 MED ORDER — LEUCOVORIN CALCIUM INJECTION 100 MG
20.0000 mg/m2 | Freq: Once | INTRAMUSCULAR | Status: AC
  Administered 2017-08-12: 44 mg via INTRAVENOUS
  Filled 2017-08-12: qty 2.2

## 2017-08-12 MED ORDER — LEUCOVORIN CALCIUM INJECTION 350 MG: 400.0000 mg/m2 | Freq: Once | INTRAVENOUS | Status: DC

## 2017-08-12 MED ORDER — DEXTROSE 5 % IV SOLN
Freq: Once | INTRAVENOUS | Status: AC
  Administered 2017-08-12: 10:00:00 via INTRAVENOUS
  Filled 2017-08-12: qty 1000

## 2017-08-12 MED ORDER — FLUOROURACIL CHEMO INJECTION 2.5 GM/50ML
400.0000 mg/m2 | Freq: Once | INTRAVENOUS | Status: AC
  Administered 2017-08-12: 850 mg via INTRAVENOUS
  Filled 2017-08-12: qty 17

## 2017-08-12 MED ORDER — SODIUM CHLORIDE 0.9 % IV SOLN
2400.0000 mg/m2 | INTRAVENOUS | Status: DC
  Administered 2017-08-12: 5200 mg via INTRAVENOUS
  Filled 2017-08-12: qty 100

## 2017-08-12 NOTE — Progress Notes (Signed)
Blood return noted before, during and after Leucovorin and Adrucil push.  

## 2017-08-12 NOTE — Progress Notes (Signed)
Hematology/Oncology Follow Up Note [facility]  [phone] [phone]  Patient Care Team: Tonia Ghent, MD as PCP - General (Family Medicine)   Name of the patient: [name]  [id number]  1935/03/23   REASON FOR VISIT Follow up of management of esophageal adenocarcinoma and management of iron deficiency anemia.  HISTORY OF PRESENT ILLNESS Patient is a 83 year old female who has metastatic esophageal cancer with liver involvement.  Weight loss 20 pounds for the past year prior to diagnosis.  # EGD during admission showed partially obstructive esophageal mass at the distal part of esophagus. Biopsy was taken. Pathology showed esophageal adenocarcinoma. #US biopsy of liver lesion to proof distant metastasis.  Report family history of breast cancer, but she has been having routine mammograms.  # Lives alone. She's a former smoker.  # Molecular Testing:  #[employer] Cdx: No reportable alternations with companion diagnostic (CDx) claims.  MS stable Tumor mutation burden: Cannot be determined, CCND3 amplification: Equivocal.  Amended reports on 06/04/2017, Tumor mutational burden changed from can not be determined to "TMB -low" on the re-analyzed samples.  # HER 2 negative.   Current Treatment S/p 6 cycles of FOLFOX, PET scan showed excellent partial response from both chemotherapy and radiation, result was discussed with patient.  currently on 5-FU maintenance.  S/p palliative radiation in January 2019  INTERVAL HISTORY Patient presents for evaluation prior to Cycle 3 Maintenance 5-FU chemotherapy for esophageal adenocarcinoma. Today feeling well, except that she has felt her sensation of food sticking in her esophagus has get worse.  Reports having to drink fluid while eating solid foods.  Appetite is very good and has gained 2 pounds since last visit. 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   Takes Lyrica.  Denies any diarrhea, skin rash, fever or chills.. Mild fatigue     Review of systems Review of Systems  Constitutional: Positive for malaise/fatigue. Negative for chills, diaphoresis and fever.  HENT: Negative for hearing loss.   Eyes: Negative for blurred vision and pain.  Respiratory: Negative for cough, hemoptysis, sputum production and wheezing.   Cardiovascular: Negative for chest pain, orthopnea, claudication and leg swelling.  Gastrointestinal: Negative for abdominal pain, constipation, diarrhea, heartburn, melena, nausea and vomiting.        Worsening of food stuck in esophagus.  Genitourinary: Negative for dysuria, flank pain, frequency and urgency.  Musculoskeletal: Negative for back pain, joint pain, myalgias and neck pain.  Skin: Negative for rash.  Neurological: Positive for tingling and sensory change. Negative for dizziness and tremors.  Endo/Heme/Allergies: Does not bruise/bleed easily.  Psychiatric/Behavioral: Negative for depression and substance abuse. The patient is not nervous/anxious.     Allergies  Allergen Reactions  . Cefuroxime Nausea Only  . Codeine Nausea Only  . Doxycycline Other (See Comments)    Lip swelling  . Gabapentin Other (See Comments)    Swelling.   . Iohexol Itching    07-15-13 pt developed itching on fingers after contrast given. Dr. Irish Elders looked at pt and said to put in system as allergy. BB  . Other Other (See Comments)    Contrast Dye- caused fever, chills, awful feeling Bandages - itching, rash  . Oxycodone Other (See Comments)    nausea  . Quinolones Swelling    Lip swelling  . Synvisc [Hylan G-F 20] Swelling     Past Medical History:  Diagnosis Date  . Arthritis   . Asthma   . COPD (chronic obstructive  pulmonary disease) ([place])   . Dysphonia   . Dyspnea   . Esophageal cancer ([place])   . Heart murmur   . History of kidney stones   . Hypertension   . Melanoma ([place])   . OAB (overactive bladder)   . OSA on  CPAP   . 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 Personal history of chemotherapy    esophageal cancer  . Personal history of radiation therapy    esophageal cancer  . Pupil asymmetry    R pupil defect  . Skin cancer   . Sleep apnea   . Spinal cord abscess      Past Surgical History:  Procedure Laterality Date  . ABDOMINAL HYSTERECTOMY    . ANTERIOR CERVICAL DECOMP/DISCECTOMY FUSION N/A 07/16/2013   Procedure: ANTERIOR CERVICAL DECOMPRESSION/DISCECTOMY FUSION mutlipleLEVELS C4-7;  Surgeon: Erline Levine, MD;  Location: [place] [facility]URO ORS;  Service: Neurosurgery;  Laterality: N/A;  . APPENDECTOMY    . carpel tunn Bilateral   . CATARACT EXTRACTION    . CATARACT EXTRACTION, BILATERAL    . CHOLECYSTECTOMY    . dental implant    . ESOPHAGOGASTRODUODENOSCOPY Left 03/21/2017   Procedure: ESOPHAGOGASTRODUODENOSCOPY (EGD);  Surgeon: Virgel Manifold, MD;  Location: [facility] END[facility]COPY;  Service: Endoscopy;  Laterality: Left;  Marland Kitchen GASTRIC BYPASS    . HIATAL HERNIA REPAIR    . MELANOMA EXCISION    . PORTA CATH INSERTION N/A 04/07/2017   Procedure: PORTA CATH INSERTION;  Surgeon: Algernon Huxley, MD;  Location: [place] [facility] CV LAB;  Service: Cardiovascular;  Laterality: N/A;  . POSTERIOR CERVICAL FUSION/FORAMINOTOMY N/A 07/28/2013   Procedure: Cervical four-seven  posterior cervical fusion;  Surgeon: Erline Levine, MD;  Location: [place] NEURO ORS;  Service: Neurosurgery;  Laterality: N/A;  . TONSILLECTOMY    . TOTAL KNEE ARTHROPLASTY      Social History   Socioeconomic History  . Marital status: Widowed    Spouse name: Not on file  . Number of children: 2  . Years of education: Not on file  . Highest education level: Master's degree (e.g., MA, MS, MEng, MEd, MSW, MBA)  Occupational History  . Not on file  Social Needs  . Financial resource strain: Not hard at all  . Food insecurity:    Worry: Never true    Inability: Never true  . Transportation needs:    Medical: No     Non-medical: No  Tobacco Use  . Smoking status: Former Smoker    Packs/day: 1.00    Years: 20.00    Pack years: 20.00    Types: Cigarettes    Last attempt to quit: 1977    Years since quitting: 42.2  . Smokeless tobacco: Never Used  Substance and Sexual Activity  . Alcohol use: No  . Drug use: No  . Sexual activity: Never  Lifestyle  . Physical activity:    Days per week: 2 days    Minutes per session: 30 min  . Stress: Not at all  Relationships  . Social connections:    Talks on phone: More than three times a week    Gets together: More than three times a week    Attends religious service: More than 4 times per year    Active member of club or organization: Yes    Attends meetings of clubs or organizations: More than 4 times per year    Relationship status: Widowed  . Intimate partner violence:    Fear of  current or ex partner: No    Emotionally abused: No    Physically abused: No    Forced sexual activity: No  Other Topics Concern  . Not on file  Social History Narrative   Marital Status- Widowed    Lives by herself    Employement- Retired Pharmacist, hospital   Exercise hx- Does PT     Family History  Problem Relation Age of Onset  . Breast cancer Mother 81  . Arthritis-Osteo Mother   . Breast cancer Sister 59  . Bladder Cancer Sister   . Bladder Cancer Father   . Tongue cancer Father   . Congenital heart disease Father   . Prostate cancer Brother   . Uterine cancer Maternal Aunt   . Lung cancer Paternal Uncle   . Leukemia Maternal Grandmother   . Liver cancer Paternal Grandmother   . Colon cancer Neg Hx      Current Outpatient Medications:  .  acetaminophen (TYLENOL) 325 MG tablet, Take 650 mg by mouth every 6 (six) hours as needed for moderate pain or headache. , Disp: , Rfl:  .  ALBUTEROL SULFATE HFA IN, Inhale into the lungs. , Disp: , Rfl:  .  bisacodyl (DULCOLAX) 10 MG suppository, Place rectally., Disp: , Rfl:  .  calcium carbonate (TUMS EX) 750 MG chewable  tablet, Chew by mouth., Disp: , Rfl:  .  Cholecalciferol (VITAMIN D) 2000 UNITS CAPS, Take 2,000 Units by mouth daily., Disp: , Rfl:  .  cyanocobalamin 500 MCG tablet, Take 500 mcg every other day by mouth., Disp: , Rfl:  .  dexamethasone (DECADRON) 4 MG tablet, Take 2 tablets (8 mg total) by mouth daily. Start the day after chemotherapy for 2 days. Take with food. (Patient not taking: Reported on 07/29/2017), Disp: 30 tablet, Rfl: 1 .  Diclofenac Sodium 1 % CREA, Apply to left arm as needed for pain three times daily (Patient not taking: Reported on 07/29/2017), Disp: 120 g, Rfl: 0 .  fluticasone (FLONASE) 50 MCG/ACT nasal spray, Place 1-2 sprays into both nostrils daily., Disp: 16 g, Rfl: 5 .  hydrocortisone cream 1 %, Apply 1 application topically daily as needed for itching. , Disp: , Rfl:  .  lidocaine-prilocaine (EMLA) cream, Apply to affected area once, Disp: 30 g, Rfl: 3 .  loperamide (IMODIUM) 2 MG capsule, Take 1 capsule (2 mg total) by mouth See admin instructions. With onset of loose stool, take 25m followed by 223mevery 2 hours until 12 hours have passed without loose bowel movement. Maximum: 16 mg/day (Patient not taking: Reported on 07/29/2017), Disp: 120 capsule, Rfl: 0 .  loratadine (CLARITIN) 10 MG tablet, Take 10 mg by mouth daily., Disp: , Rfl:  .  LYRICA 150 MG capsule, TAKE 1 CAPSULE BY MOUTH TWO TIMES DAILY, Disp: 180 capsule, Rfl: 1 .  metoprolol succinate (TOPROL-XL) 50 MG 24 hr tablet, TAKE 1 TABLET BY MOUTH DAILY, Disp: 90 tablet, Rfl: 3 .  ondansetron (ZOFRAN) 8 MG tablet, Take 1 tablet (8 mg total) by mouth 2 (two) times daily as needed for refractory nausea / vomiting. Start on day 3 after chemotherapy., Disp: 30 tablet, Rfl: 1 .  oxybutynin (DITROPAN) 5 MG tablet, TAKE 1/2 TABLET BY MOUTH IN THE MORNING AND 1 TABLET AT BEDTIME, Disp: , Rfl:  .  pantoprazole (PROTONIX) 40 MG tablet, Take 1 tablet (40 mg total) by mouth daily., Disp: 30 tablet, Rfl: 1 .  Pediatric Multiple  Vitamins (FLINTSTONES MULTIVITAMIN PO), Take by mouth., Disp: ,  Rfl:  .  Polyethyl Glycol-Propyl Glycol (SYSTANE OP), Apply 1 drop to eye daily as needed (dry eyes)., Disp: , Rfl:  .  prochlorperazine (COMPAZINE) 10 MG tablet, Take 1 tablet (10 mg total) by mouth every 6 (six) hours as needed (Nausea or vomiting). (Patient not taking: Reported on 07/29/2017), Disp: 30 tablet, Rfl: 1 .  pyridOXINE (VITAMIN B-6) 100 MG tablet, Take 1 tablet (100 mg total) by mouth daily., Disp: 30 tablet, Rfl: 3 .  senna-docusate (SENOKOT-S) 8.6-50 MG tablet, Take 1 tablet at bedtime as needed by mouth for mild constipation. (Patient not taking: Reported on 07/29/2017), Disp: , Rfl:  .  sucralfate (CARAFATE) 1 g tablet, Take 1 tablet (1 g total) by mouth 3 (three) times daily before meals., Disp: 90 tablet, Rfl: 4 .  traMADol (ULTRAM) 50 MG tablet, Take 1 tablet (50 mg total) by mouth 3 (three) times daily as needed for moderate pain. (Patient not taking: Reported on 07/29/2017), Disp: 30 tablet, Rfl: 1 .  traZODone (DESYREL) 50 MG tablet, TAKE 1/2 TABLET BY MOUTH AT BEDTIME IF NEEDED FOR SLEEP, Disp: 45 tablet, Rfl: 3  Physical exam:  Vitals:   08/12/17 0920  BP: (!) 158/93  Pulse: 63  Temp: 98.3 F (36.8 C)  TempSrc: Tympanic  Weight: 220 lb 3.2 oz (99.9 kg)   Physical Exam  Constitutional: She is oriented to person, place, and time and well-developed, well-nourished, and in no distress. No distress.  HENT:  Head: Normocephalic and atraumatic.  Mouth/Throat: Oropharynx is clear and moist. No oropharyngeal exudate.  Eyes: Pupils are equal, round, and reactive to light. Conjunctivae and EOM are normal. Left eye exhibits no discharge. No scleral icterus.  Neck: Normal range of motion. Neck supple. No tracheal deviation present.  Cardiovascular: Normal rate and regular rhythm.  Murmur heard. Pulmonary/Chest: Effort normal and breath sounds normal. No respiratory distress. She has no wheezes. She exhibits no  tenderness.  Abdominal: Soft. Bowel sounds are normal. She exhibits no distension. There is no tenderness. There is no guarding.  Musculoskeletal: Normal range of motion. She exhibits no edema or deformity.  Lymphadenopathy:    She has no cervical adenopathy.  Neurological: She is alert and oriented to person, place, and time. No cranial nerve deficit. Coordination normal.  Skin: Skin is warm and dry. No rash noted. She is not diaphoretic. No erythema.  Psychiatric: Affect and judgment normal.    CMP Latest Ref Rng & Units 07/29/2017  Glucose 65 - 99 mg/dL 101(H)  BUN 6 - 20 mg/dL 13  Creatinine 0.44 - 1.00 mg/dL 0.66  Sodium 135 - 145 mmol/L 141  Potassium 3.5 - 5.1 mmol/L 3.6  Chloride 101 - 111 mmol/L 104  CO2 22 - 32 mmol/L 28  Calcium 8.9 - 10.3 mg/dL 9.1  Total Protein 6.5 - 8.1 g/dL 6.5  Total Bilirubin 0.3 - 1.2 mg/dL 0.8  Alkaline Phos 38 - 126 U/L 75  AST 15 - 41 U/L 36  ALT 14 - 54 U/L 16   CBC Latest Ref Rng & Units 07/29/2017  WBC 3.6 - 11.0 K/uL 5.0  Hemoglobin 12.0 - 16.0 g/dL 12.7  Hematocrit 35.0 - 47.0 % 37.8  Platelets 150 - 440 K/uL 125(L)    Assessment and plan  Cancer Staging Malignant neoplasm of lower third of esophagus (HCC) Staging form: Esophagus - Adenocarcinoma, AJCC 8th Edition - Clinical stage from 04/10/2017: Stage IVB (cTX, cNX, pM1) - Signed by Earlie Server, MD on 04/10/2017  1. Malignant neoplasm of  lower third of esophagus ([place])   2. Encounter for antineoplastic chemotherapy   3. Iron deficiency anemia due to chronic blood loss   4. Esophageal dysphagia    # Proceed with cycle 3 maintenance 5-FU bolus and 48-hour infusion. Tolerates well.  # Iron deficiency anemia: We will monitor her iron level periodically. # Esophageal dysphagia: Recent PET scan in February showed good treatment response. I suspect she may have developed stricture as complication from radiation. I will touch base with GI.    # Pre-existing peripheral neuropathy: continue  Lyrica and B6 supplementation #Heart murmur advised patient to follow-up with cardiology   Follow up in 2week for assessment prior to next cycle of 5-FU maintenance treatment.  Patient knows to call if any concerns or questions.   Earlie Server, MD, PhD Hematology Oncology [facility] Pager- [phone] 08/12/17

## 2017-08-13 ENCOUNTER — Encounter: Payer: Self-pay | Admitting: Gastroenterology

## 2017-08-13 ENCOUNTER — Ambulatory Visit: Payer: Medicare Other | Admitting: Gastroenterology

## 2017-08-13 ENCOUNTER — Other Ambulatory Visit: Payer: Self-pay

## 2017-08-13 VITALS — BP 137/72 | HR 62 | Temp 98.1°F | Ht 62.0 in | Wt 221.2 lb

## 2017-08-13 DIAGNOSIS — R1319 Other dysphagia: Secondary | ICD-10-CM

## 2017-08-13 DIAGNOSIS — R1310 Dysphagia, unspecified: Secondary | ICD-10-CM

## 2017-08-13 DIAGNOSIS — C159 Malignant neoplasm of esophagus, unspecified: Secondary | ICD-10-CM

## 2017-08-13 NOTE — Addendum Note (Signed)
Addended by: Earl Lagos on: 08/13/2017 04:22 PM   Modules accepted: Orders

## 2017-08-13 NOTE — Progress Notes (Signed)
[name] Antigua, MD [address]  [place]  [place], [place] [address]  Main: [phone]  Fax: [phone]   Primary Care Physician: Tonia Ghent, MD  Primary Gastroenterologist:  Dr. Vonda [name]  Chief Complaint  Patient presents with  . Establish Care    referral: Esophageal Dysphagia-questionable stricture from radiation    HPI: [name] is a 83 y.o. female With metastatic esophageal cancer with liver involvement. Pt. Reports dysphagia to solids that started 2-3 weeks ago. With multiple meals a day. Drinks water to get it down. No dysphagia with liquids.  Dysphagia is mostly associated with meats, and bread.  No problems swallowing soft foods like pudding, mashed potato.  Able to maintain caloric intake and nutrition.    Seeing oncology and has received palliative radiation in Jan 2019, and is s/p 6 cycles of FOLFOX therapy with as per Dr. Collie Siad note "PET scan showed excellent partial response from both chemotherapy and radiation, result was discussed with patient". No abdominal pain, or Nausea/vomiting or ER visits for food impaction. Pt had denied dysphagia on last visit on May 09 2017 after receiving chemo and radiation.   Previous history:  Admitted to the hospital in Nov 2018 for intermittent dysphagia and 20 lb weight loss with EGD revealing Esophageal Tumor from 35cm to 40cm (5cm mass) extending to the cardia. GE Junction at 37 cm. GJ anastomosis from previous Gastric Bypass n 2004 was at 42cm. Biopsy showed adnocarcinoma. Also was found to have liver lesions and biopsy showed metastatic adenocarcinoma consistent with esophageal origin.   Colonoscopy in 2013 by Dr. Gustavo Lah showed diverticulosis, redundant colon, cecum seen at a distance partly obstructed with watery prep. Repeat recommended in 4 yrs. 2009 Colonoscopy was complete and 2 polyps 20mm and 10 mm removed. 2006 Colonoscopy 94mm and 10 mm polyp removed. Path report from these procedures are  not available.   Current Outpatient Medications  Medication Sig Dispense Refill  . acetaminophen (TYLENOL) 325 MG tablet Take 650 mg by mouth every 6 (six) hours as needed for moderate pain or headache.     . ALBUTEROL SULFATE HFA IN Inhale into the lungs.     . bisacodyl (DULCOLAX) 10 MG suppository Place rectally.    . calcium carbonate (TUMS EX) 750 MG chewable tablet Chew by mouth.    . Cholecalciferol (VITAMIN D) 2000 UNITS CAPS Take 2,000 Units by mouth daily.    . cyanocobalamin 500 MCG tablet Take 500 mcg every other day by mouth.    . Diclofenac Sodium 1 % CREA Apply to left arm as needed for pain three times daily 120 g 0  . fluticasone (FLONASE) 50 MCG/ACT nasal spray Place 1-2 sprays into both nostrils daily. 16 g 5  . hydrocortisone cream 1 % Apply 1 application topically daily as needed for itching.     . lidocaine-prilocaine (EMLA) cream Apply to affected area once 30 g 3  . loperamide (IMODIUM) 2 MG capsule Take 1 capsule (2 mg total) by mouth See admin instructions. With onset of loose stool, take 4mg  followed by 2mg  every 2 hours until 12 hours have passed without loose bowel movement. Maximum: 16 mg/day 120 capsule 0  . loratadine (CLARITIN) 10 MG tablet Take 10 mg by mouth daily.    Marland Kitchen LYRICA 150 MG capsule TAKE 1 CAPSULE BY MOUTH TWO TIMES DAILY 180 capsule 1  . metoprolol succinate (TOPROL-XL) 50 MG 24 hr tablet TAKE 1 TABLET BY MOUTH DAILY 90 tablet 3  .  ondansetron (ZOFRAN) 8 MG tablet Take 1 tablet (8 mg total) by mouth 2 (two) times daily as needed for refractory nausea / vomiting. Start on day 3 after chemotherapy. 30 tablet 1  . oxybutynin (DITROPAN) 5 MG tablet TAKE 1/2 TABLET BY MOUTH IN THE MORNING AND 1 TABLET AT BEDTIME    . Pediatric Multiple Vitamins (FLINTSTONES MULTIVITAMIN PO) Take by mouth.    Vladimir Faster Glycol-Propyl Glycol (SYSTANE OP) Apply 1 drop to eye daily as needed (dry eyes).    . prochlorperazine (COMPAZINE) 10 MG tablet Take 1 tablet (10 mg  total) by mouth every 6 (six) hours as needed (Nausea or vomiting). 30 tablet 1  . pyridOXINE (VITAMIN B-6) 100 MG tablet Take 1 tablet (100 mg total) by mouth daily. 30 tablet 3  . senna-docusate (SENOKOT-S) 8.6-50 MG tablet Take 1 tablet at bedtime as needed by mouth for mild constipation.    . sucralfate (CARAFATE) 1 g tablet Take 1 tablet (1 g total) by mouth 3 (three) times daily before meals. 90 tablet 4  . traMADol (ULTRAM) 50 MG tablet Take 1 tablet (50 mg total) by mouth 3 (three) times daily as needed for moderate pain. 30 tablet 1  . traZODone (DESYREL) 50 MG tablet TAKE 1/2 TABLET BY MOUTH AT BEDTIME IF NEEDED FOR SLEEP 45 tablet 3  . pantoprazole (PROTONIX) 40 MG tablet Take 1 tablet (40 mg total) by mouth daily. (Patient not taking: Reported on 08/13/2017) 30 tablet 1   No current facility-administered medications for this visit.     Allergies as of 08/13/2017 - Review Complete 08/13/2017  Allergen Reaction Noted  . Cefuroxime Nausea Only 07/11/2015  . Codeine Nausea Only 01/22/2011  . Doxycycline Other (See Comments) 12/03/2013  . Gabapentin Other (See Comments) 12/19/2016  . Iohexol Itching 07/15/2013  . Other Other (See Comments) 12/03/2013  . Oxycodone Other (See Comments) 12/19/2016  . Quinolones Swelling 12/03/2013  . Synvisc [hylan g-f 20] Swelling 01/22/2011    ROS:  General: Negative for anorexia, weight loss, fever, chills, fatigue, weakness. ENT: Negative for hoarseness, difficulty swallowing , nasal congestion. CV: Negative for chest pain, angina, palpitations, dyspnea on exertion, peripheral edema.  Respiratory: Negative for dyspnea at rest, dyspnea on exertion, cough, sputum, wheezing.  GI: See history of present illness. GU:  Negative for dysuria, hematuria, urinary incontinence, urinary frequency, nocturnal urination.  Endo: Negative for unusual weight change.    Physical Examination:   BP 137/72   Pulse 62   Temp 98.1 F (36.7 C) (Oral)   Ht 5\' 2"   (1.575 m)   Wt 221 lb 3.2 oz (100.3 kg)   BMI 40.46 kg/m   General: Well-nourished, well-developed in no acute distress.  Eyes: No icterus. Conjunctivae pink. Mouth: Oropharyngeal mucosa moist and pink , no lesions erythema or exudate. Neck: Supple, Trachea midline Abdomen: Bowel sounds are normal, nontender, nondistended, no hepatosplenomegaly or masses, no abdominal bruits or hernia , no rebound or guarding.   Extremities: No lower extremity edema. No clubbing or deformities. Neuro: Alert and oriented x 3.  Grossly intact. Skin: Warm and dry, no jaundice.   Psych: Alert and cooperative, normal mood and affect.   Labs: CMP     Component Value Date/Time   NA 141 08/12/2017 0901   NA 138 07/13/2013 0609   K 3.5 08/12/2017 0901   K 4.1 07/13/2013 0609   CL 108 08/12/2017 0901   CL 104 07/13/2013 0609   CO2 25 08/12/2017 0901   CO2 29 07/13/2013  6808   GLUCOSE 81 08/12/2017 0901   GLUCOSE 115 (H) 07/13/2013 0609   BUN 12 08/12/2017 0901   BUN 15 07/13/2013 0609   CREATININE 0.57 08/12/2017 0901   CREATININE 0.67 07/13/2013 0609   CALCIUM 9.1 08/12/2017 0901   CALCIUM 8.3 (L) 07/13/2013 0609   PROT 6.4 (L) 08/12/2017 0901   ALBUMIN 3.4 (L) 08/12/2017 0901   AST 35 08/12/2017 0901   ALT 16 08/12/2017 0901   ALKPHOS 69 08/12/2017 0901   BILITOT 0.8 08/12/2017 0901   GFRNONAA >60 08/12/2017 0901   GFRNONAA >60 07/13/2013 0609   GFRAA >60 08/12/2017 0901   GFRAA >60 07/13/2013 0609   Lab Results  Component Value Date   WBC 3.8 08/12/2017   HGB 12.0 08/12/2017   HCT 35.5 08/12/2017   MCV 95.4 08/12/2017   PLT 127 (L) 08/12/2017    Imaging Studies: PET scan report reviewed  Assessment and Plan:   CORA BRIERLEY is a 83 y.o. y/o female diagnosed with esophageal adenocarcinoma with liver involvement, seeing oncology for chemotherapy, and has received radiation, last in January 2019, now with dysphagia to solid foods, but good appetite  Due to new dysphagia  despite response on PET scan, endoscopic evaluation is indicated to evaluate for mass size and dilate any radiation induced strictures. Oncology note reviewed, and new dysphagia despite good response to chemotherapy, is concerning for radiation induced stricture and instead of mass induced dysphagia.  I have discussed alternative options, risks & benefits,  which include, but are not limited to, bleeding, infection, perforation,respiratory complication & drug reaction.  The patient agrees with this plan & written consent will be obtained.    Pt was started on PPI by Dr. Tasia Catchings, which would also help any underlying radiation esophagitis Pt. Tolerating soft foods without difficulty. Alarm symptoms to come to ER with discussed and she verbalized understanding.  Dysphagia diet discussed including eating soft foods, small bites, followed by liquids, chew food well and eat slowly  Dr [name]

## 2017-08-13 NOTE — Progress Notes (Signed)
Cardiology Office Note  Date:  08/14/2017   ID:  [name], DOB 06-21-1934, MRN [id number]  PCP:  Tonia Ghent, MD   Chief Complaint  Patient presents with  . other    Ref by Dr. Tasia Catchings for cardiac murmur. Meds reviewed by the pt. verbally. Pt. c/o shortness of breath with LE edema.     HPI:  [name] is a 83 year old woman with past medical history of Smoker, stopped 1977, smoked 20 yr Prior history gastric bypass 2004 November 2018 with dysphasia 20 pound weight loss EGD showing esophageal tumor Esophageal adenocarcinoma, metastatic Liver involvement Dysphasia to solids Palliative radiation January 2019 s/p 6 cycles of FOLFOX  Who presents by referral from Dr. Tasia Catchings for consultation of her murmur  Recent PET CT scan results reviewed with her PET scan showed excellent partial response from both chemotherapy and radiation CT scan images pulled up in the office  This shows mild aortic atherosclerosis, mild coronary calcifications Aortic valve and mitral valve calcification  Echocardiogram November 2018 reviewed with her showing mild to moderate aortic valve stenosis By mean pressure gradient it is mild stenosis  Denies having significant shortness of breath Discussed her recent cancer treatments, recovery Overall feeling relatively well except for some dysphasia  Having worsening lower extremity edema Previously was on HCTZ and Lasix 20 mg daily These were held at the start of her treatments  EKG personally reviewed by myself on todays visit Shows normal sinus rhythm rate 61 bpm no significant ST or T wave changes  Family history discussed with her Father with CAD, CABG (34 yo), CHF   PMH:   has a past medical history of Arthritis, Asthma, COPD (chronic obstructive pulmonary disease) ([place]), Dysphonia, Dyspnea, Esophageal cancer ([place]), Heart murmur, History of kidney stones, Hypertension, Melanoma ([place]), OAB (overactive bladder), OSA on CPAP, Paresthesia,  Personal history of chemotherapy, Personal history of radiation therapy, Pupil asymmetry, Skin cancer, Sleep apnea, and Spinal cord abscess.  PSH:    Past Surgical History:  Procedure Laterality Date  . ABDOMINAL HYSTERECTOMY    . ANTERIOR CERVICAL DECOMP/DISCECTOMY FUSION N/A 07/16/2013   Procedure: ANTERIOR CERVICAL DECOMPRESSION/DISCECTOMY FUSION mutlipleLEVELS C4-7;  Surgeon: Erline Levine, MD;  Location: [facility];  Service: Neurosurgery;  Laterality: N/A;  . APPENDECTOMY    . carpel tunn Bilateral   . CATARACT EXTRACTION    . CATARACT EXTRACTION, BILATERAL    . CHOLECYSTECTOMY    . dental implant    . ESOPHAGOGASTRODUODENOSCOPY Left 03/21/2017   Procedure: ESOPHAGOGASTRODUODENOSCOPY (EGD);  Surgeon: Virgel Manifold, MD;  Location: [facility] ENDOSCOPY;  Service: Endoscopy;  Laterality: Left;  Marland Kitchen GASTRIC BYPASS    . HIATAL HERNIA REPAIR    . MELANOMA EXCISION    . PORTA CATH INSERTION N/A 04/07/2017   Procedure: PORTA CATH INSERTION;  Surgeon: Algernon Huxley, MD;  Location: [place] LAB;  Service: Cardiovascular;  Laterality: N/A;  . POSTERIOR CERVICAL FUSION/FORAMINOTOMY N/A 07/28/2013   Procedure: Cervical four-seven  posterior cervical fusion;  Surgeon: Erline Levine, MD;  Location: [facility];  Service: Neurosurgery;  Laterality: N/A;  . TONSILLECTOMY    . TOTAL KNEE ARTHROPLASTY      Current Outpatient Medications  Medication Sig Dispense Refill  . acetaminophen (TYLENOL) 325 MG tablet Take 650 mg by mouth every 6 (six) hours as needed for moderate pain or headache.     . ALBUTEROL SULFATE HFA IN Inhale into the lungs.     . bisacodyl (DULCOLAX) 10 MG suppository  Place rectally.    . calcium carbonate (TUMS EX) 750 MG chewable tablet Chew by mouth.    . Cholecalciferol (VITAMIN D) 2000 UNITS CAPS Take 2,000 Units by mouth daily.    . cyanocobalamin 500 MCG tablet Take 500 mcg every other day by mouth.    . Diclofenac Sodium 1 % CREA Apply to left arm as needed for pain  three times daily 120 g 0  . fluticasone (FLONASE) 50 MCG/ACT nasal spray Place 1-2 sprays into both nostrils daily. 16 g 5  . hydrocortisone cream 1 % Apply 1 application topically daily as needed for itching.     . lidocaine-prilocaine (EMLA) cream Apply to affected area once 30 g 3  . loperamide (IMODIUM) 2 MG capsule Take 1 capsule (2 mg total) by mouth See admin instructions. With onset of loose stool, take 4mg  followed by 2mg  every 2 hours until 12 hours have passed without loose bowel movement. Maximum: 16 mg/day 120 capsule 0  . loratadine (CLARITIN) 10 MG tablet Take 10 mg by mouth daily.    Marland Kitchen LYRICA 150 MG capsule TAKE 1 CAPSULE BY MOUTH TWO TIMES DAILY 180 capsule 1  . metoprolol succinate (TOPROL-XL) 50 MG 24 hr tablet TAKE 1 TABLET BY MOUTH DAILY 90 tablet 3  . ondansetron (ZOFRAN) 8 MG tablet Take 1 tablet (8 mg total) by mouth 2 (two) times daily as needed for refractory nausea / vomiting. Start on day 3 after chemotherapy. 30 tablet 1  . oxybutynin (DITROPAN) 5 MG tablet TAKE 1/2 TABLET BY MOUTH IN THE MORNING AND 1 TABLET AT BEDTIME    . pantoprazole (PROTONIX) 40 MG tablet Take 1 tablet (40 mg total) by mouth daily. 30 tablet 1  . Pediatric Multiple Vitamins (FLINTSTONES MULTIVITAMIN PO) Take by mouth.    Vladimir Faster Glycol-Propyl Glycol (SYSTANE OP) Apply 1 drop to eye daily as needed (dry eyes).    . prochlorperazine (COMPAZINE) 10 MG tablet Take 1 tablet (10 mg total) by mouth every 6 (six) hours as needed (Nausea or vomiting). 30 tablet 1  . pyridOXINE (VITAMIN B-6) 100 MG tablet Take 1 tablet (100 mg total) by mouth daily. 30 tablet 3  . senna-docusate (SENOKOT-S) 8.6-50 MG tablet Take 1 tablet at bedtime as needed by mouth for mild constipation.    . sucralfate (CARAFATE) 1 g tablet Take 1 tablet (1 g total) by mouth 3 (three) times daily before meals. 90 tablet 4  . traMADol (ULTRAM) 50 MG tablet Take 1 tablet (50 mg total) by mouth 3 (three) times daily as needed for  moderate pain. 30 tablet 1  . traZODone (DESYREL) 50 MG tablet TAKE 1/2 TABLET BY MOUTH AT BEDTIME IF NEEDED FOR SLEEP 45 tablet 3  . furosemide (LASIX) 20 MG tablet Take 1 tablet (20 mg total) by mouth daily. 90 tablet 3  . Potassium Citrate POWD Take 20 mEq by mouth daily as needed. 100 Bottle 1   No current facility-administered medications for this visit.      Allergies:   Cefuroxime; Codeine; Doxycycline; Gabapentin; Iohexol; Other; Oxycodone; Quinolones; and Synvisc [hylan g-f 20]   Social History:  The patient  reports that she quit smoking about 42 years ago. Her smoking use included cigarettes. She has a 20.00 pack-year smoking history. She has never used smokeless tobacco. She reports that she does not drink alcohol or use drugs.   Family History:   family history includes Arthritis-Osteo in her mother; Bladder Cancer in her father and sister; Breast  cancer (age of onset: 77) in her mother; Breast cancer (age of onset: 60) in her sister; Congenital heart disease in her father; Leukemia in her maternal grandmother; Liver cancer in her paternal grandmother; Lung cancer in her paternal uncle; Prostate cancer in her brother; Tongue cancer in her father; Uterine cancer in her maternal aunt.    Review of Systems: Review of Systems  Constitutional: Negative.   Respiratory: Negative.   Cardiovascular: Negative.   Gastrointestinal: Negative.        Dysphasia  Musculoskeletal: Negative.   Neurological: Negative.   Psychiatric/Behavioral: Negative.   All other systems reviewed and are negative.    PHYSICAL EXAM: VS:  BP (!) 141/83 (BP Location: Right Arm, Patient Position: Sitting, Cuff Size: Large)   Pulse 61   Ht 5' 2.5" (1.588 m)   Wt 220 lb (99.8 kg)   BMI 39.60 kg/m  , BMI Body mass index is 39.6 kg/m. GEN: Well nourished, well developed, in no acute distress , obese HEENT: normal  Neck: no JVD, carotid bruits, or masses Cardiac: RRR; no murmurs, rubs, or gallops,no edema   Respiratory:  clear to auscultation bilaterally, normal work of breathing GI: soft, nontender, nondistended, + BS MS: no deformity or atrophy  Skin: warm and dry, no rash Neuro:  Strength and sensation are intact Psych: euthymic mood, full affect    Recent Labs: 12/17/2016: TSH 1.99 03/20/2017: B Natriuretic Peptide 27.0 05/26/2017: Magnesium 1.7 08/12/2017: ALT 16; BUN 12; Creatinine, Ser 0.57; Hemoglobin 12.0; Platelets 127; Potassium 3.5; Sodium 141    Lipid Panel Lab Results  Component Value Date   TRIG 134 07/16/2013      Wt Readings from Last 3 Encounters:  08/14/17 220 lb (99.8 kg)  08/13/17 221 lb 3.2 oz (100.3 kg)  08/12/17 220 lb 3.2 oz (99.9 kg)       ASSESSMENT AND PLAN:  Aortic valve stenosis, etiology of cardiac valve disease unspecified - Plan: EKG 12-Lead Aortic valve stenosis mild, possibly mild to moderate on echocardiogram in late 2018 Details discussed with her, reviewed on CT scan, calcification noted No intervention needed at this time, would recommend repeat echocardiogram in 2 years time  Essential hypertension Blood pressure is well controlled on today's visit. No changes made to the medications.  OSA on CPAP  Malignant neoplasm of lower third of esophagus (HCC) Completing chemotherapy, radiation So far tolerating well, improvement on recent PET scan  Dysphagia, unspecified type Scheduled to see GI Previous radiation treatment  Morbid obesity due to excess calories ([place])  Aortic atherosclerosis Mild diffuse disease on CT scan She does not want a statin  Coronary calcification Seen on CT, denies having anginal symptoms No strong indication for stress testing, she does not want a statin   Disposition:   F/U echocardiogram in 2 years with follow-up at that time We did discuss anginal symptoms to watch for  She does not want cholesterol medication at this time   Total encounter time more than 60 minutes  Greater than 50% was spent in  counseling and coordination of care with the patient    Orders Placed This Encounter  Procedures  . EKG 12-Lead     Signed, Esmond Plants, M.D., Ph.D. 08/14/2017  [place], [place]

## 2017-08-14 ENCOUNTER — Encounter: Payer: Self-pay | Admitting: Cardiovascular Disease

## 2017-08-14 ENCOUNTER — Ambulatory Visit: Payer: Medicare Other | Admitting: Cardiovascular Disease

## 2017-08-14 ENCOUNTER — Inpatient Hospital Stay: Payer: Medicare Other

## 2017-08-14 VITALS — BP 141/83 | HR 61 | Ht 62.5 in | Wt 220.0 lb

## 2017-08-14 VITALS — BP 150/82 | HR 71 | Temp 98.2°F | Resp 20

## 2017-08-14 DIAGNOSIS — I10 Essential (primary) hypertension: Secondary | ICD-10-CM | POA: Diagnosis not present

## 2017-08-14 DIAGNOSIS — I2510 Atherosclerotic heart disease of native coronary artery without angina pectoris: Secondary | ICD-10-CM

## 2017-08-14 DIAGNOSIS — C155 Malignant neoplasm of lower third of esophagus: Secondary | ICD-10-CM

## 2017-08-14 DIAGNOSIS — G4733 Obstructive sleep apnea (adult) (pediatric): Secondary | ICD-10-CM | POA: Diagnosis not present

## 2017-08-14 DIAGNOSIS — I350 Nonrheumatic aortic (valve) stenosis: Secondary | ICD-10-CM | POA: Diagnosis not present

## 2017-08-14 DIAGNOSIS — I700 Atherosclerosis of aorta: Secondary | ICD-10-CM

## 2017-08-14 DIAGNOSIS — Z9989 Dependence on other enabling machines and devices: Secondary | ICD-10-CM

## 2017-08-14 DIAGNOSIS — R1310 Dysphagia, unspecified: Secondary | ICD-10-CM

## 2017-08-14 MED ORDER — POTASSIUM CITRATE POWD: 20.0000 meq | Freq: Every day | 1 refills | Status: DC | PRN

## 2017-08-14 MED ORDER — HEPARIN SOD (PORK) LOCK FLUSH 100 UNIT/ML IV SOLN
500.0000 [IU] | Freq: Once | INTRAVENOUS | Status: AC | PRN
  Administered 2017-08-14: 500 [IU]
  Filled 2017-08-14: qty 5

## 2017-08-14 MED ORDER — SODIUM CHLORIDE 0.9% FLUSH
10.0000 mL | INTRAVENOUS | Status: DC | PRN
  Administered 2017-08-14: 10 mL
  Filled 2017-08-14: qty 10

## 2017-08-14 MED ORDER — POTASSIUM CHLORIDE 20 MEQ PO PACK: 20.0000 meq | PACK | ORAL | 1 refills | Status: DC | PRN

## 2017-08-14 NOTE — Patient Instructions (Addendum)
Medication Instructions:   Take lasix with potassium sparingly  For swelling  Labwork:  No new labs needed  Testing/Procedures:  No further testing at this time   Follow-Up: It was a pleasure seeing you in the office today. Please call us if you have new issues that need to be addressed before your next appt.  [phone]  Your physician wants you to follow-up in:  Echo 2 yrs, F/u in 2 years  If you need a refill on your cardiac medications before your next appointment, please call your pharmacy.  For educational health videos Log in to : [email or web address] Or : [email or web address], password : [email or web address]

## 2017-08-14 NOTE — Progress Notes (Addendum)
Nutrition Follow-up:  Patient with stage IV esophageal cancer with liver mets  Patient receiving chemotherapy. Radiation completed on 06/09/17.  Met with patient today in infusion following pump removal.  Patient reports that she is having a harder time with swallowing meats, breads, tougher foods.  Noted has been seen by GI and planning EGD on 4/10 with possible dilation.  Reports that she is taking marinol and has made her appetite "ravenous"  Reports that she is still drinking shakes but getting tired of them.      Medications: reviewed  Labs: reviewed  Anthropometrics:   Weight has increased to 221 lb 3.2 oz noted on 4/3 from 218 lb on 3/5.     NUTRITION DIAGNOSIS: Inadequate oral intake continues   INTERVENTION:   Encouraged patient to continue to focus on high calorie, high protein foods We discussed breakfast meal options as patient having hard time with picking foods for breakfast, wants toast but getting stuck.   Encouraged patient to continue to use oral nutrition supplements Patient to continue marinol for appetite stimulation    MONITORING, EVALUATION, GOAL: weight trends, intake   NEXT VISIT: April 18 after pump removal  Leyland Kenna B. Zenia Resides, [place], [place] Registered Dietitian [phone] (pager)

## 2017-08-14 NOTE — Addendum Note (Signed)
Addended by: Valora Corporal on: 08/14/2017 02:20 PM   Modules accepted: Orders

## 2017-08-16 ENCOUNTER — Other Ambulatory Visit: Payer: Self-pay | Admitting: Oncology

## 2017-08-20 ENCOUNTER — Ambulatory Visit
Admission: RE | Admit: 2017-08-20 | Discharge: 2017-08-20 | Disposition: A | Discharge status: Home / Self Care | Payer: Medicare Other | Source: Ambulatory Visit | Attending: Gastroenterology | Admitting: Gastroenterology

## 2017-08-20 ENCOUNTER — Encounter
Admission: RE | Disposition: A | Discharge status: Home / Self Care | Payer: Self-pay | Source: Ambulatory Visit | Attending: Gastroenterology

## 2017-08-20 ENCOUNTER — Ambulatory Visit: Payer: Medicare Other | Admitting: Anesthesiology

## 2017-08-20 ENCOUNTER — Encounter: Payer: Self-pay | Admitting: *Deleted

## 2017-08-20 DIAGNOSIS — Z9884 Bariatric surgery status: Secondary | ICD-10-CM | POA: Diagnosis not present

## 2017-08-20 DIAGNOSIS — I10 Essential (primary) hypertension: Secondary | ICD-10-CM | POA: Insufficient documentation

## 2017-08-20 DIAGNOSIS — I2510 Atherosclerotic heart disease of native coronary artery without angina pectoris: Secondary | ICD-10-CM | POA: Insufficient documentation

## 2017-08-20 DIAGNOSIS — Z923 Personal history of irradiation: Secondary | ICD-10-CM | POA: Insufficient documentation

## 2017-08-20 DIAGNOSIS — Z85820 Personal history of malignant melanoma of skin: Secondary | ICD-10-CM | POA: Diagnosis not present

## 2017-08-20 DIAGNOSIS — C159 Malignant neoplasm of esophagus, unspecified: Secondary | ICD-10-CM

## 2017-08-20 DIAGNOSIS — J449 Chronic obstructive pulmonary disease, unspecified: Secondary | ICD-10-CM | POA: Insufficient documentation

## 2017-08-20 DIAGNOSIS — Z7951 Long term (current) use of inhaled steroids: Secondary | ICD-10-CM | POA: Diagnosis not present

## 2017-08-20 DIAGNOSIS — K208 Other esophagitis without bleeding: Secondary | ICD-10-CM

## 2017-08-20 DIAGNOSIS — Z96659 Presence of unspecified artificial knee joint: Secondary | ICD-10-CM | POA: Insufficient documentation

## 2017-08-20 DIAGNOSIS — Y842 Radiological procedure and radiotherapy as the cause of abnormal reaction of the patient, or of later complication, without mention of misadventure at the time of the procedure: Secondary | ICD-10-CM | POA: Diagnosis not present

## 2017-08-20 DIAGNOSIS — Z9989 Dependence on other enabling machines and devices: Secondary | ICD-10-CM | POA: Diagnosis not present

## 2017-08-20 DIAGNOSIS — T66XXXA Radiation sickness, unspecified, initial encounter: Secondary | ICD-10-CM

## 2017-08-20 DIAGNOSIS — G4730 Sleep apnea, unspecified: Secondary | ICD-10-CM | POA: Diagnosis not present

## 2017-08-20 DIAGNOSIS — Z8501 Personal history of malignant neoplasm of esophagus: Secondary | ICD-10-CM | POA: Insufficient documentation

## 2017-08-20 DIAGNOSIS — I739 Peripheral vascular disease, unspecified: Secondary | ICD-10-CM | POA: Diagnosis not present

## 2017-08-20 DIAGNOSIS — R1310 Dysphagia, unspecified: Secondary | ICD-10-CM | POA: Diagnosis present

## 2017-08-20 DIAGNOSIS — Z87891 Personal history of nicotine dependence: Secondary | ICD-10-CM | POA: Insufficient documentation

## 2017-08-20 DIAGNOSIS — Z79899 Other long term (current) drug therapy: Secondary | ICD-10-CM | POA: Diagnosis not present

## 2017-08-20 DIAGNOSIS — K222 Esophageal obstruction: Secondary | ICD-10-CM

## 2017-08-20 DIAGNOSIS — R1319 Other dysphagia: Secondary | ICD-10-CM

## 2017-08-20 DIAGNOSIS — Z9221 Personal history of antineoplastic chemotherapy: Secondary | ICD-10-CM | POA: Diagnosis not present

## 2017-08-20 HISTORY — PX: ESOPHAGOGASTRODUODENOSCOPY (EGD) WITH PROPOFOL: SHX5813

## 2017-08-20 SURGERY — ESOPHAGOGASTRODUODENOSCOPY (EGD) WITH PROPOFOL
Anesthesia: General

## 2017-08-20 MED ORDER — PROPOFOL 500 MG/50ML IV EMUL
INTRAVENOUS | Status: DC | PRN
  Administered 2017-08-20: 100 ug/kg/min via INTRAVENOUS

## 2017-08-20 MED ORDER — SODIUM CHLORIDE 0.9 % IV SOLN
INTRAVENOUS | Status: DC
  Administered 2017-08-20: 1000 mL via INTRAVENOUS

## 2017-08-20 MED ORDER — PANTOPRAZOLE SODIUM 40 MG PO TBEC: 40.0000 mg | DELAYED_RELEASE_TABLET | Freq: Two times a day (BID) | ORAL | 1 refills | Status: DC

## 2017-08-20 MED ORDER — PROPOFOL 10 MG/ML IV BOLUS
INTRAVENOUS | Status: DC | PRN
  Administered 2017-08-20: 10 mg via INTRAVENOUS
  Administered 2017-08-20 (×2): 20 mg via INTRAVENOUS
  Administered 2017-08-20: 10 mg via INTRAVENOUS

## 2017-08-20 MED ORDER — LACTATED RINGERS IV SOLN
INTRAVENOUS | Status: DC | PRN
  Administered 2017-08-20: 08:00:00 via INTRAVENOUS

## 2017-08-20 MED ORDER — LIDOCAINE HCL (CARDIAC) 20 MG/ML IV SOLN
INTRAVENOUS | Status: DC | PRN
  Administered 2017-08-20: 60 mg via INTRAVENOUS

## 2017-08-20 MED ORDER — PROPOFOL 500 MG/50ML IV EMUL
INTRAVENOUS | Status: AC
  Filled 2017-08-20: qty 50

## 2017-08-20 MED ORDER — PHENYLEPHRINE HCL 10 MG/ML IJ SOLN
INTRAMUSCULAR | Status: AC
  Filled 2017-08-20: qty 1

## 2017-08-20 MED ORDER — LIDOCAINE HCL (PF) 2 % IJ SOLN
INTRAMUSCULAR | Status: AC
  Filled 2017-08-20: qty 10

## 2017-08-20 MED ORDER — FENTANYL CITRATE (PF) 100 MCG/2ML IJ SOLN
INTRAMUSCULAR | Status: DC | PRN
  Administered 2017-08-20: 25 ug via INTRAVENOUS

## 2017-08-20 MED ORDER — FENTANYL CITRATE (PF) 100 MCG/2ML IJ SOLN
INTRAMUSCULAR | Status: AC
  Filled 2017-08-20: qty 2

## 2017-08-20 NOTE — Op Note (Signed)
[facility] Gastroenterology Patient Name: [name] Procedure Date: 08/20/2017 7:16 AM MRN: [id number] Account #: [id number] Date of Birth: 1934-11-30 Admit Type: Outpatient Age: 83 Room: [facility] ENDO ROOM 2 Gender: Female Note Status: Finalized Procedure:            Upper GI endoscopy Indications:          Dysphagia, Malignant esophageal adenocarcinoma Providers:            Vivan Agostino B. Bonna Gains MD, MD Referring MD:         Elveria Rising. Damita Dunnings, MD (Referring MD) Medicines:            Monitored Anesthesia Care Complications:        No immediate complications. Procedure:            Pre-Anesthesia Assessment:                       - The risks and benefits of the procedure and the                        sedation options and risks were discussed with the                        patient. All questions were answered and informed                        consent was obtained.                       - Patient identification and proposed procedure were                        verified prior to the procedure.                       - ASA Grade Assessment: III - A patient with severe                        systemic disease.                       After obtaining informed consent, the endoscope was                        passed under direct vision. Throughout the procedure,                        the patient's blood pressure, pulse, and oxygen                        saturations were monitored continuously. The Endoscope                        was introduced through the mouth, and advanced to the                        jejunum. The upper GI endoscopy was accomplished with                        ease. The patient tolerated the procedure well. Findings:      One benign-appearing, intrinsic moderate stenosis was found 34 to 35  cm       from the incisors. This stenosis measured 1 cm (in length). The stenosis       was traversed after dilation. The dilation site was examined and showed        moderate improvement in luminal narrowing, no mucosal tear and no       perforation. The endoscope was unable to be advanced past the narrowing       initially. Dilation to 22mm was performed and the scope was then able to       be traversed past the stricture into the stomach. After examination of       the gastric pouch and jejunum, dilation to 85mm and 89mm were performed       and good heme effect was noted.      LA Grade C (one or more mucosal breaks continuous between tops of 2 or       more mucosal folds, less than 75% circumference) esophagitis with no       bleeding was found at the gastroesophageal junction.      The previously noted esophageal mass in Nov 2018 was much decreased       indicating good response to radiation/chemotherapy.      The entire examined stomach was normal. The gastric pouch extended from       35 at the GE junction to 40 cm at the end of the gastric pouch.      The examined jejunum was normal. Impression:           - Benign-appearing esophageal stenosis. This is a                        result of radiation therapy.                       - LA Grade C radiation esophagitis.                       - The previously noted esophageal mass in Nov 2018 was                        much decreased indicating good response to                        radiation/chemotherapy.                       - Normal stomach.                       - Normal examined jejunum.                       - No specimens collected. Recommendation:       - Use Protonix (pantoprazole) 40 mg PO BID for 4 weeks.                       - Follow an antireflux regimen.                       - Return to my office in 4 weeks.                       - Soft diet.                       -  Return to primary care physician as previously                        scheduled.                       - Continue present medications.                       - Discharge patient to home.                       - The  findings and recommendations were discussed with                        the patient.                       - The findings and recommendations were discussed with                        the designated responsible adult. Procedure Code(s):    --- Professional ---                       [phone], Esophagogastroduodenoscopy, flexible, transoral;                        diagnostic, including collection of specimen(s) by                        brushing or washing, when performed (separate procedure) Diagnosis Code(s):    --- Professional ---                       K22.2, Esophageal obstruction                       K20.8, Other esophagitis                       T66.XXXA, Radiation sickness, unspecified, initial                        encounter                       R13.10, Dysphagia, unspecified                       C15.9, Malignant neoplasm of esophagus, unspecified CPT copyright 2017 American Medical Association. All rights reserved. The codes documented in this report are preliminary and upon coder review may  be revised to meet current compliance requirements.  [name], MD Margretta Sidle B. Bonna Gains MD, MD 08/20/2017 8:53:53 AM This report has been signed electronically. Number of Addenda: 0 Note Initiated On: 08/20/2017 7:16 AM Estimated Blood Loss: Estimated blood loss: none.      [facility]

## 2017-08-20 NOTE — Anesthesia Post-op Follow-up Note (Signed)
Anesthesia QCDR form completed.        

## 2017-08-20 NOTE — Transfer of Care (Signed)
Immediate Anesthesia Transfer of Care Note  Patient: [name]  Procedure(s) Performed: ESOPHAGOGASTRODUODENOSCOPY (EGD) WITH PROPOFOL (N/A )  Patient Location: PACU  Anesthesia Type:General  Level of Consciousness: drowsy  Airway & Oxygen Therapy: Patient Spontanous Breathing and Patient connected to nasal cannula oxygen  Post-op Assessment: Report given to RN and Post -op Vital signs reviewed and stable  Post vital signs: Reviewed and stable  Last Vitals:  Vitals Value Taken Time  BP 118/56 08/20/2017  8:40 AM  Temp 36.3 C 08/20/2017  8:40 AM  Pulse 66 08/20/2017  8:47 AM  Resp 11 08/20/2017  8:47 AM  SpO2 100 % 08/20/2017  8:47 AM  Vitals shown include unvalidated device data.  Last Pain:  Vitals:   08/20/17 0840  TempSrc: Tympanic  PainSc:          Complications: No apparent anesthesia complications

## 2017-08-20 NOTE — H&P (Signed)
[name] Antigua, MD [address] [place], [place], [place], [address] [address], [place], [place], [place], [address] Phone: [phone]  Fax: [phone]  Primary Care Physician:  Tonia Ghent, MD   Pre-Procedure History & Physical: HPI:  [name] is a 83 y.o. female is here for an EGD.   Past Medical History:  Diagnosis Date  . Arthritis   . Asthma   . COPD (chronic obstructive pulmonary disease) ([place])   . Dysphonia   . Dyspnea   . Esophageal cancer ([place])   . Heart murmur   . History of kidney stones   . Hypertension   . Melanoma ([place])   . OAB (overactive bladder)   . OSA on CPAP   . 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 Personal history of chemotherapy    esophageal cancer  . Personal history of radiation therapy    esophageal cancer  . Pupil asymmetry    R pupil defect  . Skin cancer   . Sleep apnea   . Spinal cord abscess     Past Surgical History:  Procedure Laterality Date  . ABDOMINAL HYSTERECTOMY    . ANTERIOR CERVICAL DECOMP/DISCECTOMY FUSION N/A 07/16/2013   Procedure: ANTERIOR CERVICAL DECOMPRESSION/DISCECTOMY FUSION mutlipleLEVELS C4-7;  Surgeon: Erline Levine, MD;  Location: [facility];  Service: Neurosurgery;  Laterality: N/A;  . APPENDECTOMY    . carpel tunn Bilateral   . CATARACT EXTRACTION    . CATARACT EXTRACTION, BILATERAL    . CHOLECYSTECTOMY    . dental implant    . ESOPHAGOGASTRODUODENOSCOPY Left 03/21/2017   Procedure: ESOPHAGOGASTRODUODENOSCOPY (EGD);  Surgeon: Virgel Manifold, MD;  Location: [facility];  Service: Endoscopy;  Laterality: Left;  [facility] GASTRIC BYPASS    . HIATAL HERNIA REPAIR    . MELANOMA EXCISION    . PORTA CATH INSERTION N/A 04/07/2017   Procedure: PORTA CATH INSERTION;  Surgeon: Algernon Huxley, MD;  Location: [facility];  Service: Cardiovascular;  Laterality: N/A;  . POSTERIOR CERVICAL FUSION/FORAMINOTOMY N/A 07/28/2013   Procedure: Cervical four-seven  posterior  cervical fusion;  Surgeon: Erline Levine, MD;  Location: [facility];  Service: Neurosurgery;  Laterality: N/A;  . TONSILLECTOMY    . TOTAL KNEE ARTHROPLASTY      Prior to Admission medications   Medication Sig Start Date End Date Taking? Authorizing Provider  acetaminophen (TYLENOL) 325 MG tablet Take 650 mg by mouth every 6 (six) hours as needed for moderate pain or headache.    Yes [provider]  Cholecalciferol (VITAMIN D) 2000 UNITS CAPS Take 2,000 Units by mouth daily.   Yes [provider]  cyanocobalamin 500 MCG tablet Take 500 mcg every other day by mouth.   Yes [provider]  loratadine (CLARITIN) 10 MG tablet Take 10 mg by mouth daily.   Yes [provider]  LYRICA 150 MG capsule TAKE 1 CAPSULE BY MOUTH TWO TIMES DAILY 01/06/17  Yes Tonia Ghent, MD  metoprolol succinate (TOPROL-XL) 50 MG 24 hr tablet TAKE 1 TABLET BY MOUTH DAILY 01/14/17  Yes Tonia Ghent, MD  oxybutynin (DITROPAN) 5 MG tablet TAKE 1/2 TABLET BY MOUTH IN THE MORNING AND 1 TABLET AT BEDTIME 03/27/17  Yes Tonia Ghent, MD  pyridOXINE (VITAMIN B-6) 100 MG tablet Take 1 tablet (100 mg total) by mouth daily. 04/28/17  Yes Earlie Server, MD  traMADol (ULTRAM) 50 MG tablet Take 1 tablet (50 mg total) by mouth  3 (three) times daily as needed for moderate pain. 04/16/17  Yes Earlie Server, MD  ALBUTEROL SULFATE HFA IN Inhale into the lungs.  07/07/15   [provider]  bisacodyl (DULCOLAX) 10 MG suppository Place rectally.    [provider]  calcium carbonate (TUMS EX) 750 MG chewable tablet Chew by mouth.    [provider]  Diclofenac Sodium 1 % CREA Apply to left arm as needed for pain three times daily 04/23/17   Earlie Server, MD  fluticasone [facility]) 50 MCG/ACT nasal spray Place 1-2 sprays into both nostrils daily. 06/13/17   Tonia Ghent, MD  furosemide (LASIX) 20 MG tablet Take 1 tablet (20 mg total) by mouth daily. 08/14/17   Minna Merritts, MD    hydrocortisone cream 1 % Apply 1 application topically daily as needed for itching.     [provider]  lidocaine-prilocaine (EMLA) cream Apply to affected area once 04/10/17   Earlie Server, MD  loperamide (IMODIUM) 2 MG capsule Take 1 capsule (2 mg total) by mouth See admin instructions. With onset of loose stool, take 4mg  followed by 2mg  every 2 hours until 12 hours have passed without loose bowel movement. Maximum: 16 mg/day 04/14/17   Earlie Server, MD  ondansetron (ZOFRAN) 8 MG tablet Take 1 tablet (8 mg total) by mouth 2 (two) times daily as needed for refractory nausea / vomiting. Start on day 3 after chemotherapy. 06/30/17   Earlie Server, MD  pantoprazole (PROTONIX) 40 MG tablet TAKE ONE TABLET EVERY DAY 08/16/17   Earlie Server, MD  Pediatric Multiple Vitamins (FLINTSTONES MULTIVITAMIN PO) Take by mouth.    [provider]  Polyethyl Glycol-Propyl Glycol (SYSTANE OP) Apply 1 drop to eye daily as needed (dry eyes).    [provider]  potassium chloride (KLOR-CON) 20 MEQ packet Take 20 mEq by mouth as needed (Daily as needed with fluid pill). 08/14/17   Minna Merritts, MD  prochlorperazine (COMPAZINE) 10 MG tablet Take 1 tablet (10 mg total) by mouth every 6 (six) hours as needed (Nausea or vomiting). 06/30/17   Earlie Server, MD  senna-docusate (SENOKOT-S) 8.6-50 MG tablet Take 1 tablet at bedtime as needed by mouth for mild constipation. 03/22/17   Gouru, Illene Silver, MD  sucralfate (CARAFATE) 1 g tablet Take 1 tablet (1 g total) by mouth 3 (three) times daily before meals. 05/05/17   Noreene Filbert, MD  traZODone (DESYREL) 50 MG tablet TAKE 1/2 TABLET BY MOUTH AT BEDTIME IF NEEDED FOR SLEEP 04/17/17   Tonia Ghent, MD    Allergies as of 08/14/2017 - Review Complete 08/14/2017  Allergen Reaction Noted  . Cefuroxime Nausea Only 07/11/2015  . Codeine Nausea Only 01/22/2011  . Doxycycline Other (See Comments) 12/03/2013  . Gabapentin Other (See Comments) 12/19/2016  . Iohexol Itching  07/15/2013  . Other Other (See Comments) 12/03/2013  . Oxycodone Other (See Comments) 12/19/2016  . Quinolones Swelling 12/03/2013  . Synvisc [hylan g-f 20] Swelling 01/22/2011    Family History  Problem Relation Age of Onset  . Breast cancer Mother 29  . Arthritis-Osteo Mother   . Breast cancer Sister 41  . Bladder Cancer Sister   . Bladder Cancer Father   . Tongue cancer Father   . Congenital heart disease Father   . Prostate cancer Brother   . Uterine cancer Maternal Aunt   . Lung cancer Paternal Uncle   . Leukemia Maternal Grandmother   . Liver cancer Paternal Grandmother   . Colon  cancer Neg Hx     Social History   Socioeconomic History  . Marital status: Widowed    Spouse name: Not on file  . Number of children: 2  . Years of education: Not on file  . Highest education level: Master's degree (e.g., MA, MS, MEng, MEd, MSW, MBA)  Occupational History  . Not on file  Social Needs  . Financial resource strain: Not hard at all  . Food insecurity:    Worry: Never true    Inability: Never true  . Transportation needs:    Medical: No    Non-medical: No  Tobacco Use  . Smoking status: Former Smoker    Packs/day: 1.00    Years: 20.00    Pack years: 20.00    Types: Cigarettes    Last attempt to quit: 1977    Years since quitting: 42.2  . Smokeless tobacco: Never Used  Substance and Sexual Activity  . Alcohol use: No  . Drug use: No  . Sexual activity: Never  Lifestyle  . Physical activity:    Days per week: 2 days    Minutes per session: 30 min  . Stress: Not at all  Relationships  . Social connections:    Talks on phone: More than three times a week    Gets together: More than three times a week    Attends religious service: More than 4 times per year    Active member of club or organization: Yes    Attends meetings of clubs or organizations: More than 4 times per year    Relationship status: Widowed  . Intimate partner violence:    Fear of current or  ex partner: No    Emotionally abused: No    Physically abused: No    Forced sexual activity: No  Other Topics Concern  . Not on file  Social History Narrative   Marital Status- Widowed    Lives by herself    Employement- Retired Pharmacist, hospital   Exercise hx- Does PT     Review of Systems: See HPI, otherwise negative ROS  Physical Exam: There were no vitals taken for this visit. General:   Alert,  pleasant and cooperative in NAD Head:  Normocephalic and atraumatic. Neck:  Supple; no masses or thyromegaly. Lungs:  Clear throughout to auscultation, normal respiratory effort.    Heart:  +S1, +S2, Regular rate and rhythm, No edema. Abdomen:  Soft, nontender and nondistended. Normal bowel sounds, without guarding, and without rebound.   Neurologic:  Alert and  oriented x4;  grossly normal neurologically.  Impression/Plan: [name] is here for an EGD for Dysphagia Risks, benefits, limitations, and alternatives regarding the procedure have been reviewed with the patient.  Questions have been answered.  All parties agreeable.   Virgel Manifold, MD  08/20/2017, 7:53 AM

## 2017-08-20 NOTE — Anesthesia Postprocedure Evaluation (Signed)
Anesthesia Post Note  Patient: Janis T Curl  Procedure(s) Performed: ESOPHAGOGASTRODUODENOSCOPY (EGD) WITH PROPOFOL (N/A )  Patient location during evaluation: Endoscopy Anesthesia Type: General Level of consciousness: awake and alert Pain management: pain level controlled Vital Signs Assessment: post-procedure vital signs reviewed and stable Respiratory status: spontaneous breathing, nonlabored ventilation and respiratory function stable Cardiovascular status: blood pressure returned to baseline and stable Postop Assessment: no apparent nausea or vomiting Anesthetic complications: no     Last Vitals:  Vitals:   08/20/17 0910 08/20/17 0916  BP:  (!) 115/98  Pulse: 70   Resp:    Temp:    SpO2: 90%     Last Pain:  Vitals:   08/20/17 0840  TempSrc: Tympanic  PainSc:                  Alphonsus Sias

## 2017-08-20 NOTE — Anesthesia Preprocedure Evaluation (Signed)
Anesthesia Evaluation  Patient identified by MRN, date of birth, ID band Patient awake    Reviewed: Allergy & Precautions, H&P , NPO status , reviewed documented beta blocker date and time   Airway Mallampati: III  TM Distance: >3 FB Neck ROM: full   Comment: Hoarseness Dental  (+) Caps   Pulmonary shortness of breath, asthma , sleep apnea and Continuous Positive Airway Pressure Ventilation , COPD, former smoker,    Pulmonary exam normal        Cardiovascular hypertension, + CAD and + Peripheral Vascular Disease  Normal cardiovascular exam+ Valvular Problems/Murmurs   Study Conclusions  - Left ventricle: The cavity size was normal. Systolic function was   normal. The estimated ejection fraction was in the range of 55%   to 65%. - Aortic valve: There was mild to moderate stenosis. There was   moderate regurgitation. Valve area (VTI): 2.28 cm^2. Valve area   (Vmax): 2.01 cm^2. Valve area (Vmean): 2.27 cm^2. - Mitral valve: There was mild regurgitation. Valve area by   pressure half-time: 2.34 cm^2. Valve area by continuity equation   (using LVOT flow): 2.77 cm^2.   Neuro/Psych  Neuromuscular disease    GI/Hepatic   Endo/Other    Renal/GU      Musculoskeletal  (+) Arthritis ,   Abdominal   Peds  Hematology  (+) anemia ,   Anesthesia Other Findings Smoker, stopped 1977, smoked 20 yr Prior history gastric bypass 2004 November 2018 with dysphasia 20 pound weight loss EGD showing esophageal tumor Esophageal adenocarcinoma, metastatic Liver involvement Dysphasia to solids Palliative radiation January 2019 s/p 6 cycles of FOLFOX   Reproductive/Obstetrics                             Anesthesia Physical Anesthesia Plan  ASA: IV  Anesthesia Plan: General   Post-op Pain Management:    Induction:   PONV Risk Score and Plan: 3 and Propofol infusion  Airway Management Planned:    Additional Equipment:   Intra-op Plan:   Post-operative Plan:   Informed Consent: I have reviewed the patients History and Physical, chart, labs and discussed the procedure including the risks, benefits and alternatives for the proposed anesthesia with the patient or authorized representative who has indicated his/her understanding and acceptance.   Dental Advisory Given  Plan Discussed with: CRNA  Anesthesia Plan Comments:         Anesthesia Quick Evaluation

## 2017-08-21 ENCOUNTER — Encounter: Payer: Self-pay | Admitting: Gastroenterology

## 2017-08-22 ENCOUNTER — Telehealth: Payer: Self-pay | Admitting: *Deleted

## 2017-08-22 NOTE — Telephone Encounter (Signed)
Spoke with pharmacy regarding potassium pills/tablets and advised to check with patient to see which one she would like to use as either would be fine. Patient needs option because of throat problems. Pharmacy said that they would check with the patient to see which one she would like to use and take care of getting it ready for her. They were appreciative for the call with no further questions.

## 2017-08-26 ENCOUNTER — Inpatient Hospital Stay: Payer: Medicare Other

## 2017-08-26 ENCOUNTER — Other Ambulatory Visit: Payer: Self-pay

## 2017-08-26 ENCOUNTER — Encounter: Payer: Self-pay | Admitting: Oncology

## 2017-08-26 ENCOUNTER — Inpatient Hospital Stay: Payer: Medicare Other | Admitting: Oncology

## 2017-08-26 VITALS — BP 187/96 | HR 65 | Temp 97.2°F | Resp 18 | Wt 219.0 lb

## 2017-08-26 DIAGNOSIS — R1319 Other dysphagia: Secondary | ICD-10-CM

## 2017-08-26 DIAGNOSIS — I10 Essential (primary) hypertension: Secondary | ICD-10-CM

## 2017-08-26 DIAGNOSIS — C787 Secondary malignant neoplasm of liver and intrahepatic bile duct: Secondary | ICD-10-CM

## 2017-08-26 DIAGNOSIS — R1310 Dysphagia, unspecified: Secondary | ICD-10-CM

## 2017-08-26 DIAGNOSIS — C155 Malignant neoplasm of lower third of esophagus: Secondary | ICD-10-CM

## 2017-08-26 DIAGNOSIS — R5383 Other fatigue: Secondary | ICD-10-CM

## 2017-08-26 DIAGNOSIS — R5381 Other malaise: Secondary | ICD-10-CM

## 2017-08-26 DIAGNOSIS — Z5111 Encounter for antineoplastic chemotherapy: Secondary | ICD-10-CM

## 2017-08-26 DIAGNOSIS — Z79899 Other long term (current) drug therapy: Secondary | ICD-10-CM

## 2017-08-26 DIAGNOSIS — G629 Polyneuropathy, unspecified: Secondary | ICD-10-CM | POA: Diagnosis not present

## 2017-08-26 DIAGNOSIS — Z923 Personal history of irradiation: Secondary | ICD-10-CM | POA: Diagnosis not present

## 2017-08-26 DIAGNOSIS — Z87891 Personal history of nicotine dependence: Secondary | ICD-10-CM

## 2017-08-26 DIAGNOSIS — D500 Iron deficiency anemia secondary to blood loss (chronic): Secondary | ICD-10-CM | POA: Diagnosis not present

## 2017-08-26 DIAGNOSIS — G4733 Obstructive sleep apnea (adult) (pediatric): Secondary | ICD-10-CM

## 2017-08-26 DIAGNOSIS — Z806 Family history of leukemia: Secondary | ICD-10-CM

## 2017-08-26 DIAGNOSIS — Z808 Family history of malignant neoplasm of other organs or systems: Secondary | ICD-10-CM

## 2017-08-26 DIAGNOSIS — Z9989 Dependence on other enabling machines and devices: Secondary | ICD-10-CM | POA: Diagnosis not present

## 2017-08-26 DIAGNOSIS — Z800 Family history of malignant neoplasm of digestive organs: Secondary | ICD-10-CM

## 2017-08-26 DIAGNOSIS — Z8042 Family history of malignant neoplasm of prostate: Secondary | ICD-10-CM

## 2017-08-26 DIAGNOSIS — R634 Abnormal weight loss: Secondary | ICD-10-CM | POA: Diagnosis not present

## 2017-08-26 DIAGNOSIS — Z803 Family history of malignant neoplasm of breast: Secondary | ICD-10-CM

## 2017-08-26 DIAGNOSIS — Z8052 Family history of malignant neoplasm of bladder: Secondary | ICD-10-CM

## 2017-08-26 DIAGNOSIS — Z7189 Other specified counseling: Secondary | ICD-10-CM

## 2017-08-26 LAB — COMPREHENSIVE METABOLIC PANEL
ALT: 17 U/L (ref 14–54)
ANION GAP: 10 (ref 5–15)
AST: 35 U/L (ref 15–41)
Albumin: 3.6 g/dL (ref 3.5–5.0)
Alkaline Phosphatase: 74 U/L (ref 38–126)
BILIRUBIN TOTAL: 0.8 mg/dL (ref 0.3–1.2)
BUN: 11 mg/dL (ref 6–20)
CHLORIDE: 103 mmol/L (ref 101–111)
CO2: 29 mmol/L (ref 22–32)
Calcium: 9 mg/dL (ref 8.9–10.3)
Creatinine, Ser: 0.71 mg/dL (ref 0.44–1.00)
GFR calc Af Amer: >60 mL/min (ref >60)
Glucose, Bld: 107 mg/dL — ABNORMAL HIGH (ref 65–99)
POTASSIUM: 3.6 mmol/L (ref 3.5–5.1)
Sodium: 142 mmol/L (ref 135–145)
TOTAL PROTEIN: 6.7 g/dL (ref 6.5–8.1)

## 2017-08-26 LAB — CBC WITH DIFFERENTIAL/PLATELET
BASOS ABS: 0 10*3/uL (ref 0–0.1)
Basophils Relative: 1 %
EOS PCT: 4 %
Eosinophils Absolute: 0.2 10*3/uL (ref 0–0.7)
HCT: 36.8 % (ref 35.0–47.0)
HEMOGLOBIN: 12.7 g/dL (ref 12.0–16.0)
LYMPHS ABS: 0.4 10*3/uL — AB (ref 1.0–3.6)
LYMPHS PCT: 10 %
MCH: 33.8 pg (ref 26.0–34.0)
MCHC: 34.6 g/dL (ref 32.0–36.0)
MCV: 97.6 fL (ref 80.0–100.0)
Monocytes Absolute: 0.6 10*3/uL (ref 0.2–0.9)
Monocytes Relative: 13 %
NEUTROS PCT: 72 %
Neutro Abs: 3.3 10*3/uL (ref 1.4–6.5)
PLATELETS: 145 10*3/uL — AB (ref 150–440)
RBC: 3.77 MIL/uL — ABNORMAL LOW (ref 3.80–5.20)
RDW: 18.1 % — ABNORMAL HIGH (ref 11.5–14.5)
WBC: 4.5 10*3/uL (ref 3.6–11.0)

## 2017-08-26 MED ORDER — LEUCOVORIN CALCIUM INJECTION 100 MG
20.0000 mg/m2 | Freq: Once | INTRAMUSCULAR | Status: AC
  Administered 2017-08-26: 44 mg via INTRAVENOUS
  Filled 2017-08-26: qty 2.2

## 2017-08-26 MED ORDER — SODIUM CHLORIDE 0.9 % IV SOLN
INTRAVENOUS | Status: DC
  Administered 2017-08-26: 10:00:00 via INTRAVENOUS
  Filled 2017-08-26: qty 1000

## 2017-08-26 MED ORDER — SODIUM CHLORIDE 0.9 % IV SOLN
2400.0000 mg/m2 | INTRAVENOUS | Status: DC
  Administered 2017-08-26: 5200 mg via INTRAVENOUS
  Filled 2017-08-26: qty 100

## 2017-08-26 MED ORDER — FLUOROURACIL CHEMO INJECTION 2.5 GM/50ML
400.0000 mg/m2 | Freq: Once | INTRAVENOUS | Status: AC
  Administered 2017-08-26: 850 mg via INTRAVENOUS
  Filled 2017-08-26: qty 17

## 2017-08-26 MED ORDER — LEUCOVORIN CALCIUM INJECTION 350 MG: 400.0000 mg/m2 | Freq: Once | INTRAVENOUS | Status: DC

## 2017-08-26 NOTE — Progress Notes (Signed)
Here for follow up.stated overall doing well

## 2017-08-26 NOTE — Progress Notes (Signed)
Hematology/Oncology Follow Up Note [facility]  Telephone:([phone] [phone]  Patient Care Team: Tonia Ghent, MD as PCP - General (Family Medicine)   Name of the patient: [name]  [id number]  1934-11-20   REASON FOR VISIT Follow up of management of esophageal adenocarcinoma and management of iron deficiency anemia.  HISTORY OF PRESENT ILLNESS Patient is a 83 year old female who has metastatic esophageal cancer with liver involvement.  Weight loss 20 pounds for the past year prior to diagnosis.  # EGD during admission showed partially obstructive esophageal mass at the distal part of esophagus. Biopsy was taken. Pathology showed esophageal adenocarcinoma. #US biopsy of liver lesion to proof distant metastasis.  Report family history of breast cancer, but she has been having routine mammograms.  # Lives alone. She's a former smoker.  # Molecular Testing:  #[employer] Cdx: No reportable alternations with companion diagnostic (CDx) claims.  MS stable Tumor mutation burden: Cannot be determined, CCND3 amplification: Equivocal.  Amended reports on 06/04/2017, Tumor mutational burden changed from can not be determined to "TMB -low" on the re-analyzed samples.  # HER 2 negative.   Current Treatment S/p 6 cycles of FOLFOX, PET scan showed excellent partial response from both chemotherapy and radiation, result was discussed with patient.  currently on 5-FU maintenance.  S/p palliative radiation in January 2019 # Developed esophageal stricture secondary to radiation in April and got dilation via endoscopy.   INTERVAL HISTORY Patient presents for evaluation prior to Cycle 4 Maintenance 5-FU chemotherapy for esophageal adenocarcinoma.  She has developed dysphagia and during the interval she had EGD done.  EGD shoed Benign-appearing esophageal stenosis. This is a result of radiation therapy.- LA Grade C radiation esophagitis. - The  previously noted esophageal mass in Nov 2018 was much decreased indicating good response to radiation/chemotherapy. - Normal stomach.- Normal examined jejunum.- No specimens collected.   Patient reports feeling well. Dysphagia has resolved. Feels well today. Has also see cardiology for heart murmur.  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  Denies any diarrhea, skin rash, fever or chills..     Review of systems Review of Systems  Constitutional: Positive for malaise/fatigue. Negative for chills, diaphoresis and fever.  HENT: Negative for hearing loss and nosebleeds.   Eyes: Negative for pain and discharge.  Respiratory: Negative for cough, hemoptysis, sputum production and wheezing.   Cardiovascular: Negative for chest pain, orthopnea, claudication and leg swelling.  Gastrointestinal: Negative for abdominal pain, constipation, diarrhea, heartburn, melena, nausea and vomiting.  Genitourinary: Negative for dysuria, flank pain, frequency and urgency.  Musculoskeletal: Negative for back pain, joint pain, myalgias and neck pain.  Skin: Negative for rash.  Neurological: Positive for tingling and sensory change. Negative for dizziness and tremors.  Endo/Heme/Allergies: Does not bruise/bleed easily.  Psychiatric/Behavioral: Negative for depression and substance abuse. The patient is not nervous/anxious.     Allergies  Allergen Reactions  . Cefuroxime Nausea Only  . Codeine Nausea Only  . Doxycycline Other (See Comments)    Lip swelling  . Gabapentin Other (See Comments)    Swelling.   . Iohexol Itching    07-15-13 pt developed itching on fingers after contrast given. Dr. Irish Elders looked at pt and said to put in system as allergy. BB  . Other Other (See Comments)    Contrast Dye- caused fever, chills, awful feeling Bandages - itching, rash  . Oxycodone Other (See Comments)    nausea  . Quinolones Swelling  Lip swelling  . Synvisc [Hylan  G-F 20] Swelling     Past Medical History:  Diagnosis Date  . Arthritis   . Asthma   . COPD (chronic obstructive pulmonary disease) ([place])   . Dysphonia   . Dyspnea   . Esophageal cancer ([place])   . Heart murmur   . History of kidney stones   . Hypertension   . Melanoma ([place])   . OAB (overactive bladder)   . OSA on CPAP   . 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 Personal history of chemotherapy    esophageal cancer  . Personal history of radiation therapy    esophageal cancer  . Pupil asymmetry    R pupil defect  . Skin cancer   . Sleep apnea   . Spinal cord abscess      Past Surgical History:  Procedure Laterality Date  . ABDOMINAL HYSTERECTOMY    . ANTERIOR CERVICAL DECOMP/DISCECTOMY FUSION N/A 07/16/2013   Procedure: ANTERIOR CERVICAL DECOMPRESSION/DISCECTOMY FUSION mutlipleLEVELS C4-7;  Surgeon: Erline Levine, MD;  Location: [facility];  Service: Neurosurgery;  Laterality: N/A;  . APPENDECTOMY    . carpel tunn Bilateral   . CATARACT EXTRACTION    . CATARACT EXTRACTION, BILATERAL    . CHOLECYSTECTOMY    . dental implant    . ESOPHAGOGASTRODUODENOSCOPY Left 03/21/2017   Procedure: ESOPHAGOGASTRODUODENOSCOPY (EGD);  Surgeon: Virgel Manifold, MD;  Location[facility]COPY;  Service: Endoscopy;  Laterality: Left;  . ESOPHAGOGASTRODUODENOSCOPY (EGD) WITH PROPOFOL N/A 08/20/2017   Procedure: ESOPHAGOGASTRODUODENOSCOPY (EGD) WITH PROPOFOL;  Surgeon: Virgel Manifold, MD;  Location: [facility] ENDOSCOPY;  Service: Endoscopy;  Laterality: N/A;  . GASTRIC BYPASS    . HIATAL HERNIA REPAIR    . MELANOMA EXCISION    . PORTA CATH INSERTION N/A 04/07/2017   Procedure: PORTA CATH INSERTION;  Surgeon: Algernon Huxley, MD;  Location: [place] [facility];  Service: Cardiovascular;  Laterality: N/A;  . POSTERIOR CERVICAL FUSION/FORAMINOTOMY N/A 07/28/2013   Procedure: Cervical four-seven  posterior cervical fusion;  Surgeon: Erline Levine, MD;  Location: [facility];   Service: Neurosurgery;  Laterality: N/A;  . TONSILLECTOMY    . TOTAL KNEE ARTHROPLASTY      Social History   Socioeconomic History  . Marital status: Widowed    Spouse name: Not on file  . Number of children: 2  . Years of education: Not on file  . Highest education level: Master's degree (e.g., MA, MS, MEng, MEd, MSW, MBA)  Occupational History  . Not on file  Social Needs  . Financial resource strain: Not hard at all  . Food insecurity:    Worry: Never true    Inability: Never true  . Transportation needs:    Medical: No    Non-medical: No  Tobacco Use  . Smoking status: Former Smoker    Packs/day: 1.00    Years: 20.00    Pack years: 20.00    Types: Cigarettes    Last attempt to quit: 1977    Years since quitting: 42.3  . Smokeless tobacco: Never Used  Substance and Sexual Activity  . Alcohol use: No  . Drug use: No  . Sexual activity: Never  Lifestyle  . Physical activity:    Days per week: 2 days    Minutes per session: 30 min  . Stress: Not at all  Relationships  . Social connections:    Talks on phone: More than three times a week  Gets together: More than three times a week    Attends religious service: More than 4 times per year    Active member of club or organization: Yes    Attends meetings of clubs or organizations: More than 4 times per year    Relationship status: Widowed  . Intimate partner violence:    Fear of current or ex partner: No    Emotionally abused: No    Physically abused: No    Forced sexual activity: No  Other Topics Concern  . Not on file  Social History Narrative   Marital Status- Widowed    Lives by herself    Employement- Retired Pharmacist, hospital   Exercise hx- Does PT     Family History  Problem Relation Age of Onset  . Breast cancer Mother 52  . Arthritis-Osteo Mother   . Breast cancer Sister 55  . Bladder Cancer Sister   . Bladder Cancer Father   . Tongue cancer Father   . Congenital heart disease Father   . Prostate  cancer Brother   . Uterine cancer Maternal Aunt   . Lung cancer Paternal Uncle   . Leukemia Maternal Grandmother   . Liver cancer Paternal Grandmother   . Colon cancer Neg Hx      Current Outpatient Medications:  .  acetaminophen (TYLENOL) 325 MG tablet, Take 650 mg by mouth every 6 (six) hours as needed for moderate pain or headache. , Disp: , Rfl:  .  ALBUTEROL SULFATE HFA IN, Inhale into the lungs. , Disp: , Rfl:  .  bisacodyl (DULCOLAX) 10 MG suppository, Place rectally., Disp: , Rfl:  .  calcium carbonate (TUMS EX) 750 MG chewable tablet, Chew by mouth., Disp: , Rfl:  .  Cholecalciferol (VITAMIN D) 2000 UNITS CAPS, Take 2,000 Units by mouth daily., Disp: , Rfl:  .  cyanocobalamin 500 MCG tablet, Take 500 mcg every other day by mouth., Disp: , Rfl:  .  Diclofenac Sodium 1 % CREA, Apply to left arm as needed for pain three times daily, Disp: 120 g, Rfl: 0 .  fluticasone (FLONASE) 50 MCG/ACT nasal spray, Place 1-2 sprays into both nostrils daily., Disp: 16 g, Rfl: 5 .  furosemide (LASIX) 20 MG tablet, Take 1 tablet (20 mg total) by mouth daily., Disp: 90 tablet, Rfl: 3 .  hydrocortisone cream 1 %, Apply 1 application topically daily as needed for itching. , Disp: , Rfl:  .  lidocaine-prilocaine (EMLA) cream, Apply to affected area once, Disp: 30 g, Rfl: 3 .  loperamide (IMODIUM) 2 MG capsule, Take 1 capsule (2 mg total) by mouth See admin instructions. With onset of loose stool, take 71m followed by 238mevery 2 hours until 12 hours have passed without loose bowel movement. Maximum: 16 mg/day, Disp: 120 capsule, Rfl: 0 .  loratadine (CLARITIN) 10 MG tablet, Take 10 mg by mouth daily., Disp: , Rfl:  .  LYRICA 150 MG capsule, TAKE 1 CAPSULE BY MOUTH TWO TIMES DAILY, Disp: 180 capsule, Rfl: 1 .  metoprolol succinate (TOPROL-XL) 50 MG 24 hr tablet, TAKE 1 TABLET BY MOUTH DAILY, Disp: 90 tablet, Rfl: 3 .  ondansetron (ZOFRAN) 8 MG tablet, Take 1 tablet (8 mg total) by mouth 2 (two) times daily  as needed for refractory nausea / vomiting. Start on day 3 after chemotherapy., Disp: 30 tablet, Rfl: 1 .  oxybutynin (DITROPAN) 5 MG tablet, TAKE 1/2 TABLET BY MOUTH IN THE MORNING AND 1 TABLET AT BEDTIME, Disp: , Rfl:  .  pantoprazole (PROTONIX) 40 MG tablet, Take 1 tablet (40 mg total) by mouth 2 (two) times daily before a meal., Disp: 30 tablet, Rfl: 1 .  Pediatric Multiple Vitamins (FLINTSTONES MULTIVITAMIN PO), Take by mouth., Disp: , Rfl:  .  Polyethyl Glycol-Propyl Glycol (SYSTANE OP), Apply 1 drop to eye daily as needed (dry eyes)., Disp: , Rfl:  .  potassium chloride (KLOR-CON) 20 MEQ packet, Take 20 mEq by mouth as needed (Daily as needed with fluid pill)., Disp: 30 packet, Rfl: 1 .  prochlorperazine (COMPAZINE) 10 MG tablet, Take 1 tablet (10 mg total) by mouth every 6 (six) hours as needed (Nausea or vomiting)., Disp: 30 tablet, Rfl: 1 .  pyridOXINE (VITAMIN B-6) 100 MG tablet, Take 1 tablet (100 mg total) by mouth daily., Disp: 30 tablet, Rfl: 3 .  senna-docusate (SENOKOT-S) 8.6-50 MG tablet, Take 1 tablet at bedtime as needed by mouth for mild constipation., Disp: , Rfl:  .  sucralfate (CARAFATE) 1 g tablet, Take 1 tablet (1 g total) by mouth 3 (three) times daily before meals., Disp: 90 tablet, Rfl: 4 .  traMADol (ULTRAM) 50 MG tablet, Take 1 tablet (50 mg total) by mouth 3 (three) times daily as needed for moderate pain., Disp: 30 tablet, Rfl: 1 .  traZODone (DESYREL) 50 MG tablet, TAKE 1/2 TABLET BY MOUTH AT BEDTIME IF NEEDED FOR SLEEP, Disp: 45 tablet, Rfl: 3  Physical exam:  Vitals:   08/26/17 0914  BP: (!) 187/96  Pulse: 65  Resp: 18  Temp: (!) 97.2 F (36.2 C)  TempSrc: Tympanic  Weight: 219 lb (99.3 kg)  ECOG 1 Physical Exam  Constitutional: She is oriented to person, place, and time. No distress.  HENT:  Head: Atraumatic.  Mouth/Throat: Oropharynx is clear and moist. No oropharyngeal exudate.  Eyes: Pupils are equal, round, and reactive to light. Conjunctivae and  EOM are normal. Left eye exhibits no discharge. No scleral icterus.  Neck: Normal range of motion. Neck supple.  Cardiovascular: Normal rate and regular rhythm.  Murmur heard. Pulmonary/Chest: Effort normal and breath sounds normal. No respiratory distress. She has no wheezes. She exhibits no tenderness.  Abdominal: Soft. Bowel sounds are normal. She exhibits no distension. There is no tenderness. There is no guarding.  Musculoskeletal: Normal range of motion. She exhibits no edema or deformity.  Lymphadenopathy:    She has no cervical adenopathy.  Neurological: She is alert and oriented to person, place, and time. No cranial nerve deficit. Coordination normal.  Skin: Skin is warm and dry. No rash noted. She is not diaphoretic. No erythema.  Psychiatric: Affect and judgment normal.    CMP Latest Ref Rng & Units 08/12/2017  Glucose 65 - 99 mg/dL 81  BUN 6 - 20 mg/dL 12  Creatinine 0.44 - 1.00 mg/dL 0.57  Sodium 135 - 145 mmol/L 141  Potassium 3.5 - 5.1 mmol/L 3.5  Chloride 101 - 111 mmol/L 108  CO2 22 - 32 mmol/L 25  Calcium 8.9 - 10.3 mg/dL 9.1  Total Protein 6.5 - 8.1 g/dL 6.4(L)  Total Bilirubin 0.3 - 1.2 mg/dL 0.8  Alkaline Phos 38 - 126 U/L 69  AST 15 - 41 U/L 35  ALT 14 - 54 U/L 16   CBC Latest Ref Rng & Units 08/12/2017  WBC 3.6 - 11.0 K/uL 3.8  Hemoglobin 12.0 - 16.0 g/dL 12.0  Hematocrit 35.0 - 47.0 % 35.5  Platelets 150 - 440 K/uL 127(L)    Assessment and plan  Cancer Staging Malignant neoplasm of  lower third of esophagus [facility]) Staging form: Esophagus - Adenocarcinoma, AJCC 8th Edition - Clinical stage from 04/10/2017: Stage IVB (cTX, cNX, pM1) - Signed by Earlie Server, MD on 04/10/2017  1. Encounter for antineoplastic chemotherapy   2. Malignant neoplasm of lower third of esophagus (HCC)   3. Esophageal dysphagia   4. Goals of care, counseling/discussion    # Proceed with cycle 4 maintenance 5-FU bolus and 48-hour infusion. Tolerates well.  # Esophageal Stricture, s/  dilation via endoscopy. Symptom improved. Continue protonix 95m BID x 1 month.   # Iron deficiency anemia:monitor iron level periodically. # Recent PET scan in May to access disease status.  # Pre-existing peripheral neuropathy: continue Lyrica and B6 supplementation # Goal of care: palliative. Discussed with patient and her siser.   Follow up in 2week for assessment prior to next cycle of 5-FU maintenance treatment.  Patient knows to call if any concerns or questions.   ZEarlie Server MD, PhD Hematology Oncology [facility] - [facility] Centerat ABloomington Endoscopy CenterPager- [phone]/16/19

## 2017-08-27 LAB — CEA: CEA1: 3.1 ng/mL (ref 0.0–4.7)

## 2017-08-28 ENCOUNTER — Inpatient Hospital Stay: Payer: Medicare Other

## 2017-08-28 VITALS — BP 154/78 | HR 71 | Temp 97.0°F | Resp 18

## 2017-08-28 DIAGNOSIS — C155 Malignant neoplasm of lower third of esophagus: Secondary | ICD-10-CM | POA: Diagnosis not present

## 2017-08-28 DIAGNOSIS — C801 Malignant (primary) neoplasm, unspecified: Secondary | ICD-10-CM

## 2017-08-28 MED ORDER — HEPARIN SOD (PORK) LOCK FLUSH 100 UNIT/ML IV SOLN
500.0000 [IU] | Freq: Once | INTRAVENOUS | Status: AC
  Administered 2017-08-28: 500 [IU] via INTRAVENOUS
  Filled 2017-08-28: qty 5

## 2017-08-28 MED ORDER — SODIUM CHLORIDE 0.9% FLUSH
10.0000 mL | INTRAVENOUS | Status: DC | PRN
  Administered 2017-08-28: 10 mL via INTRAVENOUS
  Filled 2017-08-28: qty 10

## 2017-08-28 NOTE — Progress Notes (Signed)
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  Patient receiving chemotherapy. Radiation therapy completed on 06/09/17.    Met with patient today in infusion following pump removal.  Patient s/p EGD with dilation and patient reports symptoms of dysphagia are improved.  Reports eating better and continues to drink shakes.    Medications: reviewed  Labs: reviewed  Anthropometrics:   15% weight loss in the last year and 4 months, not significant   NUTRITION DIAGNOSIS: Inadequate oral intake stable  INTERVENTION:   Encouraged patient to continue to focus on good sources of protein (chopping foods, adding gravy/liquids to foods).  Patient has been avoiding breads due to dysphagia.   Encouraged patient to continue to drink oral nutrition supplements as well for added nutrition    MONITORING, EVALUATION, GOAL: weight trends, intake   NEXT VISIT: May 2 after pump removal  Vence Lalor B. Zenia Resides, [place], [place] Registered Dietitian [phone] (pager)

## 2017-09-09 ENCOUNTER — Other Ambulatory Visit: Payer: Self-pay

## 2017-09-09 ENCOUNTER — Inpatient Hospital Stay (HOSPITAL_BASED_OUTPATIENT_CLINIC_OR_DEPARTMENT_OTHER): Payer: Medicare Other | Admitting: Oncology

## 2017-09-09 ENCOUNTER — Inpatient Hospital Stay: Payer: Medicare Other

## 2017-09-09 ENCOUNTER — Encounter: Payer: Self-pay | Admitting: Oncology

## 2017-09-09 VITALS — BP 130/69 | HR 60 | Temp 97.5°F | Resp 18 | Wt 219.5 lb

## 2017-09-09 DIAGNOSIS — Z79899 Other long term (current) drug therapy: Secondary | ICD-10-CM

## 2017-09-09 DIAGNOSIS — I10 Essential (primary) hypertension: Secondary | ICD-10-CM | POA: Diagnosis not present

## 2017-09-09 DIAGNOSIS — C787 Secondary malignant neoplasm of liver and intrahepatic bile duct: Secondary | ICD-10-CM

## 2017-09-09 DIAGNOSIS — R5381 Other malaise: Secondary | ICD-10-CM

## 2017-09-09 DIAGNOSIS — Z923 Personal history of irradiation: Secondary | ICD-10-CM | POA: Diagnosis not present

## 2017-09-09 DIAGNOSIS — R1319 Other dysphagia: Secondary | ICD-10-CM

## 2017-09-09 DIAGNOSIS — C155 Malignant neoplasm of lower third of esophagus: Secondary | ICD-10-CM

## 2017-09-09 DIAGNOSIS — K222 Esophageal obstruction: Secondary | ICD-10-CM

## 2017-09-09 DIAGNOSIS — Z806 Family history of leukemia: Secondary | ICD-10-CM

## 2017-09-09 DIAGNOSIS — R5383 Other fatigue: Secondary | ICD-10-CM | POA: Diagnosis not present

## 2017-09-09 DIAGNOSIS — D500 Iron deficiency anemia secondary to blood loss (chronic): Secondary | ICD-10-CM | POA: Diagnosis not present

## 2017-09-09 DIAGNOSIS — Z9989 Dependence on other enabling machines and devices: Secondary | ICD-10-CM

## 2017-09-09 DIAGNOSIS — R634 Abnormal weight loss: Secondary | ICD-10-CM | POA: Diagnosis not present

## 2017-09-09 DIAGNOSIS — R1310 Dysphagia, unspecified: Secondary | ICD-10-CM | POA: Diagnosis not present

## 2017-09-09 DIAGNOSIS — Z87891 Personal history of nicotine dependence: Secondary | ICD-10-CM

## 2017-09-09 DIAGNOSIS — Z8042 Family history of malignant neoplasm of prostate: Secondary | ICD-10-CM

## 2017-09-09 DIAGNOSIS — J449 Chronic obstructive pulmonary disease, unspecified: Secondary | ICD-10-CM

## 2017-09-09 DIAGNOSIS — Z5111 Encounter for antineoplastic chemotherapy: Secondary | ICD-10-CM

## 2017-09-09 DIAGNOSIS — Z8052 Family history of malignant neoplasm of bladder: Secondary | ICD-10-CM

## 2017-09-09 DIAGNOSIS — G629 Polyneuropathy, unspecified: Secondary | ICD-10-CM

## 2017-09-09 DIAGNOSIS — Z803 Family history of malignant neoplasm of breast: Secondary | ICD-10-CM

## 2017-09-09 DIAGNOSIS — Z800 Family history of malignant neoplasm of digestive organs: Secondary | ICD-10-CM

## 2017-09-09 DIAGNOSIS — G4733 Obstructive sleep apnea (adult) (pediatric): Secondary | ICD-10-CM

## 2017-09-09 DIAGNOSIS — R011 Cardiac murmur, unspecified: Secondary | ICD-10-CM | POA: Diagnosis not present

## 2017-09-09 DIAGNOSIS — Z808 Family history of malignant neoplasm of other organs or systems: Secondary | ICD-10-CM

## 2017-09-09 LAB — COMPREHENSIVE METABOLIC PANEL
ALBUMIN: 3.9 g/dL (ref 3.5–5.0)
ALT: 16 U/L (ref 14–54)
ANION GAP: 11 (ref 5–15)
AST: 41 U/L (ref 15–41)
Alkaline Phosphatase: 76 U/L (ref 38–126)
BUN: 10 mg/dL (ref 6–20)
CO2: 27 mmol/L (ref 22–32)
Calcium: 9.2 mg/dL (ref 8.9–10.3)
Chloride: 104 mmol/L (ref 101–111)
Creatinine, Ser: 0.77 mg/dL (ref 0.44–1.00)
GFR calc Af Amer: >60 mL/min (ref >60)
GFR calc non Af Amer: >60 mL/min (ref >60)
GLUCOSE: 131 mg/dL — AB (ref 65–99)
POTASSIUM: 3.5 mmol/L (ref 3.5–5.1)
SODIUM: 142 mmol/L (ref 135–145)
TOTAL PROTEIN: 6.8 g/dL (ref 6.5–8.1)
Total Bilirubin: 0.8 mg/dL (ref 0.3–1.2)

## 2017-09-09 LAB — CBC WITH DIFFERENTIAL/PLATELET
BASOS ABS: 0 10*3/uL (ref 0–0.1)
Basophils Relative: 1 %
Eosinophils Absolute: 0.2 10*3/uL (ref 0–0.7)
Eosinophils Relative: 5 %
HEMATOCRIT: 35.4 % (ref 35.0–47.0)
HEMOGLOBIN: 12.4 g/dL (ref 12.0–16.0)
LYMPHS PCT: 8 %
Lymphs Abs: 0.4 10*3/uL — ABNORMAL LOW (ref 1.0–3.6)
MCH: 34.4 pg — ABNORMAL HIGH (ref 26.0–34.0)
MCHC: 34.9 g/dL (ref 32.0–36.0)
MCV: 98.6 fL (ref 80.0–100.0)
MONO ABS: 0.6 10*3/uL (ref 0.2–0.9)
MONOS PCT: 14 %
NEUTROS ABS: 3.1 10*3/uL (ref 1.4–6.5)
NEUTROS PCT: 72 %
Platelets: 138 10*3/uL — ABNORMAL LOW (ref 150–440)
RBC: 3.59 MIL/uL — ABNORMAL LOW (ref 3.80–5.20)
RDW: 16.3 % — AB (ref 11.5–14.5)
WBC: 4.3 10*3/uL (ref 3.6–11.0)

## 2017-09-09 MED ORDER — FLUOROURACIL CHEMO INJECTION 2.5 GM/50ML
400.0000 mg/m2 | Freq: Once | INTRAVENOUS | Status: AC
  Administered 2017-09-09: 850 mg via INTRAVENOUS
  Filled 2017-09-09: qty 17

## 2017-09-09 MED ORDER — SODIUM CHLORIDE 0.9 % IV SOLN
INTRAVENOUS | Status: DC
  Administered 2017-09-09: 10:00:00 via INTRAVENOUS
  Filled 2017-09-09: qty 1000

## 2017-09-09 MED ORDER — PANTOPRAZOLE SODIUM 40 MG PO TBEC: 40.0000 mg | DELAYED_RELEASE_TABLET | Freq: Two times a day (BID) | ORAL | 1 refills | Status: DC

## 2017-09-09 MED ORDER — SODIUM CHLORIDE 0.9 % IV SOLN
2400.0000 mg/m2 | INTRAVENOUS | Status: DC
  Administered 2017-09-09: 5200 mg via INTRAVENOUS
  Filled 2017-09-09: qty 100

## 2017-09-09 MED ORDER — LEUCOVORIN CALCIUM INJECTION 100 MG
20.0000 mg/m2 | Freq: Once | INTRAMUSCULAR | Status: AC
  Administered 2017-09-09: 44 mg via INTRAVENOUS
  Filled 2017-09-09: qty 2.2

## 2017-09-09 MED ORDER — LEUCOVORIN CALCIUM INJECTION 350 MG: 400.0000 mg/m2 | Freq: Once | INTRAMUSCULAR | Status: DC

## 2017-09-09 NOTE — Progress Notes (Signed)
Patient here for follow-up. Pt having more trouble swallowing, "everytime I eat I choke."

## 2017-09-09 NOTE — Progress Notes (Signed)
Hematology/Oncology Follow Up Note [facility]  [phone] [phone]  Patient Care Team: Tonia Ghent, MD as PCP - General (Family Medicine)   Name of the patient: [name]  [id number]  Nov 17, 1934   REASON FOR VISIT Follow up of management of esophageal adenocarcinoma   HISTORY OF PRESENT ILLNESS Patient is a 83 year old female who has metastatic esophageal cancer with liver involvement.  Weight loss 20 pounds for the past year prior to diagnosis.  # EGD during admission showed partially obstructive esophageal mass at the distal part of esophagus. Biopsy was taken. Pathology showed esophageal adenocarcinoma. #US biopsy of liver lesion to proof distant metastasis.  Report family history of breast cancer, but she has been having routine mammograms.  # Lives alone. She's a former smoker.  # Molecular Testing:  #[employer] Cdx: No reportable alternations with companion diagnostic (CDx) claims.  MS stable Tumor mutation burden: Cannot be determined, CCND3 amplification: Equivocal.  Amended reports on 06/04/2017, Tumor mutational burden changed from can not be determined to "TMB -low" on the re-analyzed samples.  # HER 2 negative.   Current Treatment S/p 6 cycles of FOLFOX, PET scan showed excellent partial response from both chemotherapy and radiation, result was discussed with patient.  currently on 5-FU maintenance.  S/p palliative radiation in January 2019 # Developed esophageal stricture secondary to radiation in April and got dilation via endoscopy. EGD shoed Benign-appearing esophageal stenosis. This is a result of radiation therapy.- LA Grade C radiation esophagitis. - The previously noted esophageal mass in Nov 2018 was much decreased indicating good response to radiation/chemotherapy. - Normal stomach.- Normal examined jejunum.- No specimens collected.    INTERVAL HISTORY Patient presents for evaluation prior to Cycle 5  Maintenance 5-FU chemotherapy for esophageal adenocarcinoma.  She reports dysphagia was better for a week after dilation by GI. However, since last chemotherapy, she started to feel the sensation of food sticking again and also has pain with swallowing solids. She eats soup which she has no difficulty swallowing.  She takes protonix 4m BID and requests refill.  Continue have mild fatigue but overall doing well. She is able to participate exercise program.  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  Stool is soft and semi formed. Twice a day.   Denies any diarrhea, skin rash, fever or chills..     Review of systems Review of Systems  Constitutional: Positive for malaise/fatigue. Negative for chills, diaphoresis and fever.  HENT: Negative for congestion, ear discharge, ear pain, hearing loss, nosebleeds, sinus pain and sore throat.   Eyes: Negative for double vision, photophobia, pain, discharge and redness.  Respiratory: Negative for cough, hemoptysis, sputum production, shortness of breath and wheezing.   Cardiovascular: Negative for chest pain, palpitations, orthopnea, claudication and leg swelling.  Gastrointestinal: Negative for abdominal pain, blood in stool, diarrhea, heartburn, melena, nausea and vomiting.       Food sticking sensation. dysphagia  Genitourinary: Negative for dysuria, flank pain, frequency, hematuria and urgency.  Musculoskeletal: Negative for back pain, joint pain, myalgias and neck pain.  Skin: Negative for itching and rash.  Neurological: Positive for tingling and sensory change. Negative for dizziness, tremors, focal weakness, weakness and headaches.  Endo/Heme/Allergies: Negative for environmental allergies. Does not bruise/bleed easily.  Psychiatric/Behavioral: Negative for depression, hallucinations, substance abuse and  suicidal ideas. The patient is not nervous/anxious.  Allergies  Allergen Reactions  . Cefuroxime Nausea Only  . Codeine Nausea Only  . Doxycycline Other (See Comments)    Lip swelling  . Gabapentin Other (See Comments)    Swelling.   . Iohexol Itching    07-15-13 pt developed itching on fingers after contrast given. Dr. Irish Elders looked at pt and said to put in system as allergy. BB  . Other Other (See Comments)    Contrast Dye- caused fever, chills, awful feeling Bandages - itching, rash  . Oxycodone Other (See Comments)    nausea  . Quinolones Swelling    Lip swelling  . Synvisc [Hylan G-F 20] Swelling     Past Medical History:  Diagnosis Date  . Arthritis   . Asthma   . COPD (chronic obstructive pulmonary disease) ([place])   . Dysphonia   . Dyspnea   . Esophageal cancer ([place])   . Heart murmur   . History of kidney stones   . Hypertension   . Melanoma ([place])   . OAB (overactive bladder)   . OSA on CPAP   . 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 Personal history of chemotherapy    esophageal cancer  . Personal history of radiation therapy    esophageal cancer  . Pupil asymmetry    R pupil defect  . Skin cancer   . Sleep apnea   . Spinal cord abscess      Past Surgical History:  Procedure Laterality Date  . ABDOMINAL HYSTERECTOMY    . ANTERIOR CERVICAL DECOMP/DISCECTOMY FUSION N/A 07/16/2013   Procedure: ANTERIOR CERVICAL DECOMPRESSION/DISCECTOMY FUSION mutlipleLEVELS C4-7;  Surgeon: Erline Levine, MD;  Location: [place] [facility]URO ORS;  Service: Neurosurgery;  Laterality: N/A;  . APPENDECTOMY    . carpel tunn Bilateral   . CATARACT EXTRACTION    . CATARACT EXTRACTION, BILATERAL    . CHOLECYSTECTOMY    . dental implant    . ESOPHAGOGASTRODUODENOSCOPY Left 03/21/2017   Procedure: ESOPHAGOGASTRODUODENOSCOPY (EGD);  Surgeon: Virgel Manifold, MD;  [facility] ENDOSCOPY;  Service: Endoscopy;  Laterality: Left;  . ESOPHAGOGASTRODUODENOSCOPY (EGD) WITH  PROPOFOL N/A 08/20/2017   Procedure: ESOPHAGOGASTRODUODENOSCOPY (EGD) WITH PROPOFOL;  Surgeon: Virgel Manifold, MD;  Location: [facility] ENDOSCOPY;  Service: Endoscopy;  Laterality: N/A;  . GASTRIC BYPASS    . HIATAL HERNIA REPAIR    . MELANOMA EXCISION    . PORTA CATH INSERTION N/A 04/07/2017   Procedure: PORTA CATH INSERTION;  Surgeon: Algernon Huxley, MD;  Location: [place] CV LAB;  Service: Cardiovascular;  Laterality: N/A;  . POSTERIOR CERVICAL FUSION/FORAMINOTOMY N/A 07/28/2013   Procedure: Cervical four-seven  posterior cervical fusion;  Surgeon: Erline Levine, MD;  Location: [place] NEURO ORS;  Service: Neurosurgery;  Laterality: N/A;  . TONSILLECTOMY    . TOTAL KNEE ARTHROPLASTY      Social History   Socioeconomic History  . Marital status: Widowed    Spouse name: Not on file  . Number of children: 2  . Years of education: Not on file  . Highest education level: Master's degree (e.g., MA, MS, MEng, MEd, MSW, MBA)  Occupational History  . Not on file  Social Needs  . Financial resource strain: Not hard at all  . Food insecurity:    Worry: Never true    Inability: Never true  . Transportation needs:    Medical: No    Non-medical: No  Tobacco Use  . Smoking status: Former Smoker    Packs/day: 1.00  Years: 20.00    Pack years: 20.00    Types: Cigarettes    Last attempt to quit: 1977    Years since quitting: 42.3  . Smokeless tobacco: Never Used  Substance and Sexual Activity  . Alcohol use: No  . Drug use: No  . Sexual activity: Never  Lifestyle  . Physical activity:    Days per week: 2 days    Minutes per session: 30 min  . Stress: Not at all  Relationships  . Social connections:    Talks on phone: More than three times a week    Gets together: More than three times a week    Attends religious service: More than 4 times per year    Active member of club or organization: Yes    Attends meetings of clubs or organizations: More than 4 times per year     Relationship status: Widowed  . Intimate partner violence:    Fear of current or ex partner: No    Emotionally abused: No    Physically abused: No    Forced sexual activity: No  Other Topics Concern  . Not on file  Social History Narrative   Marital Status- Widowed    Lives by herself    Employement- Retired Pharmacist, hospital   Exercise hx- Does PT     Family History  Problem Relation Age of Onset  . Breast cancer Mother 30  . Arthritis-Osteo Mother   . Breast cancer Sister 42  . Bladder Cancer Sister   . Bladder Cancer Father   . Tongue cancer Father   . Congenital heart disease Father   . Prostate cancer Brother   . Uterine cancer Maternal Aunt   . Lung cancer Paternal Uncle   . Leukemia Maternal Grandmother   . Liver cancer Paternal Grandmother   . Colon cancer Neg Hx      Current Outpatient Medications:  .  acetaminophen (TYLENOL) 325 MG tablet, Take 650 mg by mouth every 6 (six) hours as needed for moderate pain or headache. , Disp: , Rfl:  .  ALBUTEROL SULFATE HFA IN, Inhale into the lungs. , Disp: , Rfl:  .  calcium carbonate (TUMS EX) 750 MG chewable tablet, Chew by mouth., Disp: , Rfl:  .  Cholecalciferol (VITAMIN D) 2000 UNITS CAPS, Take 2,000 Units by mouth daily., Disp: , Rfl:  .  cyanocobalamin 500 MCG tablet, Take 500 mcg every other day by mouth., Disp: , Rfl:  .  Diclofenac Sodium 1 % CREA, Apply to left arm as needed for pain three times daily, Disp: 120 g, Rfl: 0 .  fluticasone (FLONASE) 50 MCG/ACT nasal spray, Place 1-2 sprays into both nostrils daily., Disp: 16 g, Rfl: 5 .  furosemide (LASIX) 20 MG tablet, Take 1 tablet (20 mg total) by mouth daily., Disp: 90 tablet, Rfl: 3 .  lidocaine-prilocaine (EMLA) cream, Apply to affected area once, Disp: 30 g, Rfl: 3 .  loratadine (CLARITIN) 10 MG tablet, Take 10 mg by mouth daily., Disp: , Rfl:  .  LYRICA 150 MG capsule, TAKE 1 CAPSULE BY MOUTH TWO TIMES DAILY, Disp: 180 capsule, Rfl: 1 .  metoprolol succinate  (TOPROL-XL) 50 MG 24 hr tablet, TAKE 1 TABLET BY MOUTH DAILY, Disp: 90 tablet, Rfl: 3 .  ondansetron (ZOFRAN) 8 MG tablet, Take 1 tablet (8 mg total) by mouth 2 (two) times daily as needed for refractory nausea / vomiting. Start on day 3 after chemotherapy., Disp: 30 tablet, Rfl: 1 .  oxybutynin (  DITROPAN) 5 MG tablet, TAKE 1/2 TABLET BY MOUTH IN THE MORNING AND 1 TABLET AT BEDTIME, Disp: , Rfl:  .  pantoprazole (PROTONIX) 40 MG tablet, Take 1 tablet (40 mg total) by mouth 2 (two) times daily before a meal., Disp: 60 tablet, Rfl: 1 .  Polyethyl Glycol-Propyl Glycol (SYSTANE OP), Apply 1 drop to eye daily as needed (dry eyes)., Disp: , Rfl:  .  potassium chloride (KLOR-CON) 20 MEQ packet, Take 20 mEq by mouth as needed (Daily as needed with fluid pill)., Disp: 30 packet, Rfl: 1 .  pyridOXINE (VITAMIN B-6) 100 MG tablet, Take 1 tablet (100 mg total) by mouth daily., Disp: 30 tablet, Rfl: 3 .  senna-docusate (SENOKOT-S) 8.6-50 MG tablet, Take 1 tablet at bedtime as needed by mouth for mild constipation., Disp: , Rfl:  .  sucralfate (CARAFATE) 1 g tablet, Take 1 tablet (1 g total) by mouth 3 (three) times daily before meals., Disp: 90 tablet, Rfl: 4 .  traMADol (ULTRAM) 50 MG tablet, Take 1 tablet (50 mg total) by mouth 3 (three) times daily as needed for moderate pain., Disp: 30 tablet, Rfl: 1 .  traZODone (DESYREL) 50 MG tablet, TAKE 1/2 TABLET BY MOUTH AT BEDTIME IF NEEDED FOR SLEEP, Disp: 45 tablet, Rfl: 3 .  bisacodyl (DULCOLAX) 10 MG suppository, Place rectally., Disp: , Rfl:  .  hydrocortisone cream 1 %, Apply 1 application topically daily as needed for itching. , Disp: , Rfl:  .  loperamide (IMODIUM) 2 MG capsule, Take 1 capsule (2 mg total) by mouth See admin instructions. With onset of loose stool, take 48m followed by 239mevery 2 hours until 12 hours have passed without loose bowel movement. Maximum: 16 mg/day (Patient not taking: Reported on 09/09/2017), Disp: 120 capsule, Rfl: 0 .  Pediatric  Multiple Vitamins (FLINTSTONES MULTIVITAMIN PO), Take by mouth., Disp: , Rfl:  .  prochlorperazine (COMPAZINE) 10 MG tablet, Take 1 tablet (10 mg total) by mouth every 6 (six) hours as needed (Nausea or vomiting). (Patient not taking: Reported on 08/26/2017), Disp: 30 tablet, Rfl: 1  Physical exam:  Vitals:   09/09/17 0857  BP: 130/69  Pulse: 60  Resp: 18  Temp: (!) 97.5 F (36.4 C)  TempSrc: Tympanic  Weight: 219 lb 8 oz (99.6 kg)  ECOG 1 Physical Exam  Constitutional: She is oriented to person, place, and time and well-developed, well-nourished, and in no distress. No distress.  HENT:  Head: Normocephalic and atraumatic.  Nose: Nose normal.  Mouth/Throat: Oropharynx is clear and moist. No oropharyngeal exudate.  Eyes: Pupils are equal, round, and reactive to light. Conjunctivae and EOM are normal. Left eye exhibits no discharge. No scleral icterus.  Neck: Normal range of motion. Neck supple. No JVD present.  Cardiovascular: Normal rate and regular rhythm.  Murmur heard. Pulmonary/Chest: Effort normal and breath sounds normal. No respiratory distress. She has no wheezes. She has no rales. She exhibits no tenderness.  Abdominal: Soft. Bowel sounds are normal. She exhibits no distension and no mass. There is no tenderness. There is no rebound and no guarding.  Musculoskeletal: Normal range of motion. She exhibits no edema, tenderness or deformity.  Lymphadenopathy:    She has no cervical adenopathy.  Neurological: She is alert and oriented to person, place, and time. No cranial nerve deficit. She exhibits normal muscle tone. Coordination normal.  Skin: Skin is warm and dry. No rash noted. She is not diaphoretic. No erythema.  Psychiatric: Affect and judgment normal.    CMP  Latest Ref Rng & Units 09/09/2017  Glucose 65 - 99 mg/dL 131(H)  BUN 6 - 20 mg/dL 10  Creatinine 0.44 - 1.00 mg/dL 0.77  Sodium 135 - 145 mmol/L 142  Potassium 3.5 - 5.1 mmol/L 3.5  Chloride 101 - 111 mmol/L  104  CO2 22 - 32 mmol/L 27  Calcium 8.9 - 10.3 mg/dL 9.2  Total Protein 6.5 - 8.1 g/dL 6.8  Total Bilirubin 0.3 - 1.2 mg/dL 0.8  Alkaline Phos 38 - 126 U/L 76  AST 15 - 41 U/L 41  ALT 14 - 54 U/L 16   CBC Latest Ref Rng & Units 09/09/2017  WBC 3.6 - 11.0 K/uL 4.3  Hemoglobin 12.0 - 16.0 g/dL 12.4  Hematocrit 35.0 - 47.0 % 35.4  Platelets 150 - 440 K/uL 138(L)    Assessment and plan  Cancer Staging Malignant neoplasm of lower third of esophagus (HCC) Staging form: Esophagus - Adenocarcinoma, AJCC 8th Edition - Clinical stage from 04/10/2017: Stage IVB (cTX, cNX, pM1) - Signed by Earlie Server, MD on 04/10/2017  1. Malignant neoplasm of lower third of esophagus (HCC)   2. Encounter for antineoplastic chemotherapy   3. Iron deficiency anemia due to chronic blood loss   4. Esophageal dysphagia   5. Esophageal stricture    # Proceed with cycle 5 maintenance 5-FU bolus and 48-hour infusion. Tolerates well. Plan repeat PET in May.  # Esophageal Stricture, s/ dilation via endoscopy. Symptom improved and now worse again. Will touch base with GI for additional intervention.  Continue protonix 3m BID, refilled Rx sent.   # Iron deficiency anemia:monitor iron level periodically. Anemia resolved.  # Pre-existing peripheral neuropathy:Continue lyrica and B6 supplementation  Follow up in 2week for assessment prior to next cycle of 5-FU maintenance treatment.  Patient knows to call if any concerns or questions.   ZEarlie Server MD, PhD Hematology Oncology [facility]at ACarters[facility] Medical CenterPager- [phone]/30/19

## 2017-09-11 ENCOUNTER — Inpatient Hospital Stay: Payer: Medicare Other | Attending: Oncology

## 2017-09-11 VITALS — BP 126/81 | HR 59 | Temp 97.3°F | Resp 20

## 2017-09-11 DIAGNOSIS — R5381 Other malaise: Secondary | ICD-10-CM | POA: Diagnosis not present

## 2017-09-11 DIAGNOSIS — Z806 Family history of leukemia: Secondary | ICD-10-CM | POA: Insufficient documentation

## 2017-09-11 DIAGNOSIS — R5383 Other fatigue: Secondary | ICD-10-CM | POA: Insufficient documentation

## 2017-09-11 DIAGNOSIS — Z87891 Personal history of nicotine dependence: Secondary | ICD-10-CM | POA: Insufficient documentation

## 2017-09-11 DIAGNOSIS — I10 Essential (primary) hypertension: Secondary | ICD-10-CM | POA: Diagnosis not present

## 2017-09-11 DIAGNOSIS — Z85820 Personal history of malignant melanoma of skin: Secondary | ICD-10-CM | POA: Insufficient documentation

## 2017-09-11 DIAGNOSIS — Z8042 Family history of malignant neoplasm of prostate: Secondary | ICD-10-CM | POA: Diagnosis not present

## 2017-09-11 DIAGNOSIS — Z923 Personal history of irradiation: Secondary | ICD-10-CM | POA: Diagnosis not present

## 2017-09-11 DIAGNOSIS — G629 Polyneuropathy, unspecified: Secondary | ICD-10-CM | POA: Diagnosis not present

## 2017-09-11 DIAGNOSIS — Z803 Family history of malignant neoplasm of breast: Secondary | ICD-10-CM | POA: Insufficient documentation

## 2017-09-11 DIAGNOSIS — Z79899 Other long term (current) drug therapy: Secondary | ICD-10-CM | POA: Insufficient documentation

## 2017-09-11 DIAGNOSIS — K219 Gastro-esophageal reflux disease without esophagitis: Secondary | ICD-10-CM | POA: Diagnosis not present

## 2017-09-11 DIAGNOSIS — J449 Chronic obstructive pulmonary disease, unspecified: Secondary | ICD-10-CM | POA: Insufficient documentation

## 2017-09-11 DIAGNOSIS — Z808 Family history of malignant neoplasm of other organs or systems: Secondary | ICD-10-CM | POA: Insufficient documentation

## 2017-09-11 DIAGNOSIS — C787 Secondary malignant neoplasm of liver and intrahepatic bile duct: Secondary | ICD-10-CM | POA: Diagnosis not present

## 2017-09-11 DIAGNOSIS — M1990 Unspecified osteoarthritis, unspecified site: Secondary | ICD-10-CM | POA: Insufficient documentation

## 2017-09-11 DIAGNOSIS — C155 Malignant neoplasm of lower third of esophagus: Secondary | ICD-10-CM | POA: Insufficient documentation

## 2017-09-11 DIAGNOSIS — Z8052 Family history of malignant neoplasm of bladder: Secondary | ICD-10-CM | POA: Insufficient documentation

## 2017-09-11 DIAGNOSIS — D509 Iron deficiency anemia, unspecified: Secondary | ICD-10-CM | POA: Insufficient documentation

## 2017-09-11 DIAGNOSIS — Z800 Family history of malignant neoplasm of digestive organs: Secondary | ICD-10-CM | POA: Diagnosis not present

## 2017-09-11 DIAGNOSIS — Z5111 Encounter for antineoplastic chemotherapy: Secondary | ICD-10-CM | POA: Insufficient documentation

## 2017-09-11 MED ORDER — HEPARIN SOD (PORK) LOCK FLUSH 100 UNIT/ML IV SOLN
500.0000 [IU] | Freq: Once | INTRAVENOUS | Status: AC | PRN
  Administered 2017-09-11: 500 [IU]

## 2017-09-11 MED ORDER — SODIUM CHLORIDE 0.9% FLUSH
10.0000 mL | INTRAVENOUS | Status: DC | PRN
  Administered 2017-09-11: 10 mL
  Filled 2017-09-11: qty 10

## 2017-09-11 MED ORDER — HEPARIN SOD (PORK) LOCK FLUSH 100 UNIT/ML IV SOLN
INTRAVENOUS | Status: AC
  Filled 2017-09-11: qty 5

## 2017-09-16 ENCOUNTER — Ambulatory Visit: Payer: Medicare Other | Admitting: Gastroenterology

## 2017-09-16 ENCOUNTER — Other Ambulatory Visit: Payer: Self-pay

## 2017-09-16 ENCOUNTER — Encounter: Payer: Self-pay | Admitting: Gastroenterology

## 2017-09-16 VITALS — BP 141/80 | HR 56 | Temp 98.1°F | Ht 62.5 in | Wt 221.2 lb

## 2017-09-16 DIAGNOSIS — K228 Other specified diseases of esophagus: Secondary | ICD-10-CM

## 2017-09-16 DIAGNOSIS — C159 Malignant neoplasm of esophagus, unspecified: Secondary | ICD-10-CM

## 2017-09-16 DIAGNOSIS — R1310 Dysphagia, unspecified: Secondary | ICD-10-CM

## 2017-09-16 DIAGNOSIS — R1319 Other dysphagia: Secondary | ICD-10-CM

## 2017-09-16 DIAGNOSIS — K2289 Other specified disease of esophagus: Secondary | ICD-10-CM

## 2017-09-16 NOTE — Patient Instructions (Signed)
F/u 4-6 wks after EGD scheduled for 09/19/17.

## 2017-09-18 ENCOUNTER — Encounter: Payer: Self-pay | Admitting: *Deleted

## 2017-09-19 ENCOUNTER — Ambulatory Visit
Admission: RE | Admit: 2017-09-19 | Discharge: 2017-09-19 | Disposition: A | Discharge status: Home / Self Care | Payer: Medicare Other | Source: Ambulatory Visit | Attending: Gastroenterology | Admitting: Gastroenterology

## 2017-09-19 ENCOUNTER — Ambulatory Visit: Payer: Medicare Other | Admitting: Certified Registered Nurse Anesthetist

## 2017-09-19 ENCOUNTER — Encounter: Payer: Self-pay | Admitting: Student

## 2017-09-19 ENCOUNTER — Encounter
Admission: RE | Disposition: A | Discharge status: Home / Self Care | Payer: Self-pay | Source: Ambulatory Visit | Attending: Gastroenterology

## 2017-09-19 ENCOUNTER — Other Ambulatory Visit: Payer: Self-pay

## 2017-09-19 DIAGNOSIS — Z85820 Personal history of malignant melanoma of skin: Secondary | ICD-10-CM | POA: Diagnosis not present

## 2017-09-19 DIAGNOSIS — I739 Peripheral vascular disease, unspecified: Secondary | ICD-10-CM | POA: Diagnosis not present

## 2017-09-19 DIAGNOSIS — J449 Chronic obstructive pulmonary disease, unspecified: Secondary | ICD-10-CM | POA: Diagnosis not present

## 2017-09-19 DIAGNOSIS — K228 Other specified diseases of esophagus: Secondary | ICD-10-CM

## 2017-09-19 DIAGNOSIS — Z9221 Personal history of antineoplastic chemotherapy: Secondary | ICD-10-CM | POA: Diagnosis not present

## 2017-09-19 DIAGNOSIS — Z87891 Personal history of nicotine dependence: Secondary | ICD-10-CM | POA: Insufficient documentation

## 2017-09-19 DIAGNOSIS — Z96659 Presence of unspecified artificial knee joint: Secondary | ICD-10-CM | POA: Diagnosis not present

## 2017-09-19 DIAGNOSIS — Z8501 Personal history of malignant neoplasm of esophagus: Secondary | ICD-10-CM | POA: Diagnosis not present

## 2017-09-19 DIAGNOSIS — R1310 Dysphagia, unspecified: Secondary | ICD-10-CM | POA: Diagnosis not present

## 2017-09-19 DIAGNOSIS — K222 Esophageal obstruction: Secondary | ICD-10-CM | POA: Diagnosis not present

## 2017-09-19 DIAGNOSIS — G4733 Obstructive sleep apnea (adult) (pediatric): Secondary | ICD-10-CM | POA: Insufficient documentation

## 2017-09-19 DIAGNOSIS — K219 Gastro-esophageal reflux disease without esophagitis: Secondary | ICD-10-CM | POA: Insufficient documentation

## 2017-09-19 DIAGNOSIS — Z7951 Long term (current) use of inhaled steroids: Secondary | ICD-10-CM | POA: Diagnosis not present

## 2017-09-19 DIAGNOSIS — Z9884 Bariatric surgery status: Secondary | ICD-10-CM | POA: Diagnosis not present

## 2017-09-19 DIAGNOSIS — I10 Essential (primary) hypertension: Secondary | ICD-10-CM | POA: Diagnosis not present

## 2017-09-19 DIAGNOSIS — Z79899 Other long term (current) drug therapy: Secondary | ICD-10-CM | POA: Insufficient documentation

## 2017-09-19 DIAGNOSIS — C159 Malignant neoplasm of esophagus, unspecified: Secondary | ICD-10-CM | POA: Diagnosis not present

## 2017-09-19 DIAGNOSIS — Z923 Personal history of irradiation: Secondary | ICD-10-CM | POA: Diagnosis not present

## 2017-09-19 DIAGNOSIS — K2289 Other specified disease of esophagus: Secondary | ICD-10-CM

## 2017-09-19 DIAGNOSIS — R1319 Other dysphagia: Secondary | ICD-10-CM

## 2017-09-19 HISTORY — DX: Gastro-esophageal reflux disease without esophagitis: K21.9

## 2017-09-19 HISTORY — PX: ESOPHAGOGASTRODUODENOSCOPY (EGD) WITH PROPOFOL: SHX5813

## 2017-09-19 SURGERY — ESOPHAGOGASTRODUODENOSCOPY (EGD) WITH PROPOFOL
Anesthesia: General

## 2017-09-19 MED ORDER — PROPOFOL 10 MG/ML IV BOLUS
INTRAVENOUS | Status: DC | PRN
  Administered 2017-09-19: 100 mg via INTRAVENOUS

## 2017-09-19 MED ORDER — LIDOCAINE HCL (PF) 2 % IJ SOLN
INTRAMUSCULAR | Status: AC
  Filled 2017-09-19: qty 10

## 2017-09-19 MED ORDER — SODIUM CHLORIDE 0.9 % IV SOLN
INTRAVENOUS | Status: DC
  Administered 2017-09-19: 1000 mL via INTRAVENOUS

## 2017-09-19 MED ORDER — LIDOCAINE HCL (CARDIAC) PF 100 MG/5ML IV SOSY
PREFILLED_SYRINGE | INTRAVENOUS | Status: DC | PRN
  Administered 2017-09-19: 50 mg via INTRAVENOUS

## 2017-09-19 MED ORDER — PROPOFOL 500 MG/50ML IV EMUL
INTRAVENOUS | Status: DC | PRN
  Administered 2017-09-19: 130 ug/kg/min via INTRAVENOUS

## 2017-09-19 MED ORDER — SUCRALFATE 1 GM/10ML PO SUSP: 1.0000 g | Freq: Four times a day (QID) | ORAL | 1 refills | Status: DC

## 2017-09-19 MED ORDER — PROPOFOL 500 MG/50ML IV EMUL
INTRAVENOUS | Status: AC
  Filled 2017-09-19: qty 50

## 2017-09-19 NOTE — Anesthesia Postprocedure Evaluation (Signed)
Anesthesia Post Note  Patient: [name]  Procedure(s) Performed: ESOPHAGOGASTRODUODENOSCOPY (EGD) WITH PROPOFOL (N/A )  Patient location during evaluation: PACU Anesthesia Type: General Level of consciousness: awake and alert Pain management: pain level controlled Vital Signs Assessment: post-procedure vital signs reviewed and stable Respiratory status: spontaneous breathing, nonlabored ventilation, respiratory function stable and patient connected to nasal cannula oxygen Cardiovascular status: blood pressure returned to baseline and stable Postop Assessment: no apparent nausea or vomiting Anesthetic complications: no     Last Vitals:  Vitals:   09/19/17 0840 09/19/17 0850  BP: (!) 143/63   Pulse: 66 67  Resp: 16 (!) 21  Temp:    SpO2: 98% 99%    Last Pain:  Vitals:   09/19/17 0830  TempSrc: Tympanic  PainSc:                  Molli Barrows

## 2017-09-19 NOTE — Progress Notes (Signed)
[name], MD [address]  [place]  [place], [place] [address]  Main: [phone]  Fax: [phone]   Primary Care Physician: Tonia Ghent, MD  Primary Gastroenterologist:  Dr. [name]  Chief Complaint  Patient presents with  . Follow-up    dysphagia    HPI: [name] is a 83 y.o. female with history of esophageal adenocarcinoma, who has recently received radiation therapy and chemotherapy, here with persistent dysphagia.  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  Previously known esophageal mass was not seen at the time of the EGD.  Patient reports improvement in dysphasia for about 1 week after the procedure and then symptoms have returned.  At the time of the EGD, stenosis was initially traversed after dilation to 10 mm with balloon dilator.  Maximum dilation was done to 12 mm with good heme effect.  Patient is able to tolerate soft foods without difficulty, but has problems with breads and meats.  No weight loss.  Current Outpatient Medications  Medication Sig Dispense Refill  . acetaminophen (TYLENOL) 325 MG tablet Take 650 mg by mouth every 6 (six) hours as needed for moderate pain or headache.     . ALBUTEROL SULFATE HFA IN Inhale into the lungs.     . bisacodyl (DULCOLAX) 10 MG suppository Place rectally.    . calcium carbonate (TUMS EX) 750 MG chewable tablet Chew by mouth.    . Cholecalciferol (VITAMIN D) 2000 UNITS CAPS Take 2,000 Units by mouth daily.    . cyanocobalamin 500 MCG tablet Take 500 mcg every other day by mouth.    . Diclofenac Sodium 1 % CREA Apply to left arm as needed for pain three times daily 120 g 0  . fluticasone (FLONASE) 50 MCG/ACT nasal spray Place 1-2 sprays into both nostrils daily. 16 g 5  . furosemide (LASIX) 20 MG tablet Take 1 tablet (20 mg total) by mouth daily. 90 tablet 3  . hydrocortisone  cream 1 % Apply 1 application topically daily as needed for itching.     . lidocaine-prilocaine (EMLA) cream Apply to affected area once 30 g 3  . loperamide (IMODIUM) 2 MG capsule Take 1 capsule (2 mg total) by mouth See admin instructions. With onset of loose stool, take 4mg  followed by 2mg  every 2 hours until 12 hours have passed without loose bowel movement. Maximum: 16 mg/day 120 capsule 0  . loratadine (CLARITIN) 10 MG tablet Take 10 mg by mouth daily.    Marland Kitchen LYRICA 150 MG capsule TAKE 1 CAPSULE BY MOUTH TWO TIMES DAILY 180 capsule 1  . metoprolol succinate (TOPROL-XL) 50 MG 24 hr tablet TAKE 1 TABLET BY MOUTH DAILY 90 tablet 3  . ondansetron (ZOFRAN) 8 MG tablet Take 1 tablet (8 mg total) by mouth 2 (two) times daily as needed for refractory nausea / vomiting. Start on day 3 after chemotherapy. 30 tablet 1  . oxybutynin (DITROPAN) 5 MG tablet TAKE 1/2 TABLET BY MOUTH IN THE MORNING AND 1 TABLET AT BEDTIME    . pantoprazole (PROTONIX) 40 MG tablet Take 1 tablet (40 mg total) by mouth 2 (two) times daily before a meal. 60 tablet 1  . Pediatric Multiple Vitamins (FLINTSTONES MULTIVITAMIN PO) Take by mouth.    Vladimir Faster Glycol-Propyl Glycol (SYSTANE OP) Apply 1 drop to eye daily as needed (dry eyes).    Marland Kitchen  potassium chloride (KLOR-CON) 20 MEQ packet Take 20 mEq by mouth as needed (Daily as needed with fluid pill). 30 packet 1  . prochlorperazine (COMPAZINE) 10 MG tablet Take 1 tablet (10 mg total) by mouth every 6 (six) hours as needed (Nausea or vomiting). 30 tablet 1  . pyridOXINE (VITAMIN B-6) 100 MG tablet Take 1 tablet (100 mg total) by mouth daily. 30 tablet 3  . senna-docusate (SENOKOT-S) 8.6-50 MG tablet Take 1 tablet at bedtime as needed by mouth for mild constipation.    . sucralfate (CARAFATE) 1 g tablet Take 1 tablet (1 g total) by mouth 3 (three) times daily before meals. 90 tablet 4  . traMADol (ULTRAM) 50 MG tablet Take 1 tablet (50 mg total) by mouth 3 (three) times daily as needed  for moderate pain. 30 tablet 1  . traZODone (DESYREL) 50 MG tablet TAKE 1/2 TABLET BY MOUTH AT BEDTIME IF NEEDED FOR SLEEP 45 tablet 3   No current facility-administered medications for this visit.    Facility-Administered Medications Ordered in Other Visits  Medication Dose Route Frequency Provider Last Rate Last Dose  . 0.9 %  sodium chloride infusion   Intravenous Continuous [name] B, MD 20 mL/hr at 09/19/17 0758      Allergies as of 09/16/2017 - Review Complete 09/16/2017  Allergen Reaction Noted  . Cefuroxime Nausea Only 07/11/2015  . Codeine Nausea Only 01/22/2011  . Doxycycline Other (See Comments) 12/03/2013  . Gabapentin Other (See Comments) 12/19/2016  . Iohexol Itching 07/15/2013  . Other Other (See Comments) 12/03/2013  . Oxycodone Other (See Comments) 12/19/2016  . Quinolones Swelling 12/03/2013  . Synvisc [hylan g-f 20] Swelling 01/22/2011    ROS:  General: Negative for anorexia, weight loss, fever, chills, fatigue, weakness. ENT: Negative for hoarseness, positive for difficulty swallowing , negative for nasal congestion. CV: Negative for chest pain, angina, palpitations, dyspnea on exertion, peripheral edema.  Respiratory: Negative for dyspnea at rest, dyspnea on exertion, cough, sputum, wheezing.  GI: See history of present illness. GU:  Negative for dysuria, hematuria, urinary incontinence, urinary frequency, nocturnal urination.  Endo: Negative for unusual weight change.    Physical Examination:   BP (!) 141/80   Pulse (!) 56   Temp 98.1 F (36.7 C) (Oral)   Ht 5' 2.5" (1.588 m)   Wt 221 lb 3.2 oz (100.3 kg)   BMI 39.81 kg/m   General: Well-nourished, well-developed in no acute distress.  Eyes: No icterus. Conjunctivae pink. Mouth: Oropharyngeal mucosa moist and pink , no lesions erythema or exudate. Neck: Supple, Trachea midline Abdomen: Bowel sounds are normal, nontender, nondistended, no hepatosplenomegaly or masses, no abdominal bruits  or hernia , no rebound or guarding.   Extremities: No lower extremity edema. No clubbing or deformities. Neuro: Alert and oriented x 3.  Grossly intact. Skin: Warm and dry, no jaundice.   Psych: Alert and cooperative, normal mood and affect.   Labs: CMP     Component Value Date/Time   NA 142 09/09/2017 0844   NA 138 07/13/2013 0609   K 3.5 09/09/2017 0844   K 4.1 07/13/2013 0609   CL 104 09/09/2017 0844   CL 104 07/13/2013 0609   CO2 27 09/09/2017 0844   CO2 29 07/13/2013 0609   GLUCOSE 131 (H) 09/09/2017 0844   GLUCOSE 115 (H) 07/13/2013 0609   BUN 10 09/09/2017 0844   BUN 15 07/13/2013 0609   CREATININE 0.77 09/09/2017 0844   CREATININE 0.67 07/13/2013 0609   CALCIUM 9.2  09/09/2017 0844   CALCIUM 8.3 (L) 07/13/2013 0609   PROT 6.8 09/09/2017 0844   ALBUMIN 3.9 09/09/2017 0844   AST 41 09/09/2017 0844   ALT 16 09/09/2017 0844   ALKPHOS 76 09/09/2017 0844   BILITOT 0.8 09/09/2017 0844   GFRNONAA >60 09/09/2017 0844   GFRNONAA >60 07/13/2013 0609   GFRAA >60 09/09/2017 0844   GFRAA >60 07/13/2013 0609   Lab Results  Component Value Date   WBC 4.3 09/09/2017   HGB 12.4 09/09/2017   HCT 35.4 09/09/2017   MCV 98.6 09/09/2017   PLT 138 (L) 09/09/2017    Imaging Studies: No results found.  Assessment and Plan:   [name] is a 82 y.o. y/o female here for follow-up of dysphagia with history of esophageal adenocarcinoma status postchemoradiation and now with radiation esophagitis  Due to return in symptoms, will repeat EGD to evaluate for further need for dilation Continue PPI twice daily Continue sucralfate Patient able to maintain good diet with soft foods Encouraged to avoid meats and hard bread or hard foods Continue liquids Encouraged to eat soft foods to maintain good nutrition No episodes of food impaction  (Risks of PPI use were discussed with patient including bone loss, C. Diff diarrhea, pneumonia, infections, CKD, electrolyte abnormalities.   If clinically possible based on symptoms, goal would be to maintain patient on the lowest dose possible, or discontinue the medication with institution of acid reflux lifestyle modifications over time. Pt. Verbalizes understanding and chooses to continue the medication.) Given severe radiation esophagitis, benefits of PPI at this time would outweigh risks  Dr [name]

## 2017-09-19 NOTE — Anesthesia Preprocedure Evaluation (Signed)
Anesthesia Evaluation  Patient identified by MRN, date of birth, ID band Patient awake    Reviewed: Allergy & Precautions, H&P , NPO status , Patient's Chart, lab work & pertinent test results, reviewed documented beta blocker date and time   Airway Mallampati: II   Neck ROM: full    Dental  (+) Poor Dentition   Pulmonary neg pulmonary ROS, shortness of breath, asthma , sleep apnea , COPD, former smoker,    Pulmonary exam normal        Cardiovascular Exercise Tolerance: Poor hypertension, On Medications + CAD and + Peripheral Vascular Disease  negative cardio ROS Normal cardiovascular exam+ Valvular Problems/Murmurs  Rhythm:regular Rate:Normal     Neuro/Psych  Neuromuscular disease negative neurological ROS  negative psych ROS   GI/Hepatic negative GI ROS, Neg liver ROS, GERD  Medicated,  Endo/Other  negative endocrine ROS  Renal/GU negative Renal ROS  negative genitourinary   Musculoskeletal   Abdominal   Peds  Hematology negative hematology ROS (+) anemia ,   Anesthesia Other Findings Past Medical History: No date: Arthritis No date: Asthma No date: COPD (chronic obstructive pulmonary disease) (HCC) No date: Dysphonia No date: Dyspnea No date: Esophageal cancer (HCC) No date: GERD (gastroesophageal reflux disease) No date: Heart murmur No date: History of kidney stones No date: Hypertension No date: Melanoma ([place]) No date: OAB (overactive bladder) No date: OSA on CPAP No date: Paresthesia     Comment:  from neck down after spinal abscess No date: Personal history of chemotherapy     Comment:  esophageal cancer No date: Personal history of radiation therapy     Comment:  esophageal cancer No date: Pupil asymmetry     Comment:  R pupil defect No date: Skin cancer No date: Sleep apnea 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 Past Surgical History: No date: ABDOMINAL HYSTERECTOMY 07/16/2013: ANTERIOR CERVICAL  DECOMP/DISCECTOMY FUSION; N/A     Comment:  Procedure: ANTERIOR CERVICAL DECOMPRESSION/DISCECTOMY               FUSION mutlipleLEVELS C4-7;  Surgeon: Erline Levine, MD;                Location: [facility]UR[facility];  Service: Neurosurgery;                Laterality: N/A; No date: APPENDECTOMY No date: carpel tunn; Bilateral No date: CATARACT EXTRACTION No date: CATARACT EXTRACTION, BILATERAL No date: CHOLECYSTECTOMY No date: dental implant 03/21/2017: ESOPHAGOGASTRODUODENOSCOPY; Left     Comment:  Procedure: ESOPHAGOGASTRODUODENOSCOPY (EGD);  Surgeon:               Virgel Manifold, MD;  Location: [facility] ENDOSCOPY;                Service: Endoscopy;  Laterality: Left; 08/20/2017: ESOPHAGOGASTRODUODENOSCOPY (EGD) WITH PROPOFOL; N/A     Comment:  Procedure: ESOPHAGOGASTRODUODENOSCOPY (EGD) WITH               PROPOFOL;  Surgeon: Virgel Manifold, MD;  Location:               [facility] ENDOSCOPY;  Service: Endoscopy;  Laterality: N/A; No date: GASTRIC BYPASS No date: HIATAL HERNIA REPAIR No date: MELANOMA EXCISION 04/07/2017: PORTA CATH INSERTION; N/A     Comment:  Procedure: PORTA CATH INSERTION;  Surgeon: Algernon Huxley,              MD;  Location: [facility] LAB;  Service:  Cardiovascular;  Laterality: N/A; 07/28/2013: POSTERIOR CERVICAL FUSION/FORAMINOTOMY; N/A     Comment:  Procedure: Cervical four-seven  posterior cervical               fusion;  Surgeon: Erline Levine, MD;  Location: [facility] NEURO               ORS;  Service: Neurosurgery;  Laterality: N/A; No date: TONSILLECTOMY No date: TOTAL KNEE ARTHROPLASTY BMI    Body Mass Index:  40.06 kg/m     Reproductive/Obstetrics negative OB ROS                             Anesthesia Physical Anesthesia Plan  ASA: III  Anesthesia Plan: General   Post-op Pain Management:    Induction:   PONV Risk Score and Plan:   Airway Management Planned:   Additional Equipment:   Intra-op Plan:    Post-operative Plan:   Informed Consent: I have reviewed the patients History and Physical, chart, labs and discussed the procedure including the risks, benefits and alternatives for the proposed anesthesia with the patient or authorized representative who has indicated his/her understanding and acceptance.   Dental Advisory Given  Plan Discussed with: CRNA  Anesthesia Plan Comments:         Anesthesia Quick Evaluation

## 2017-09-19 NOTE — H&P (Signed)
[name] Antigua, MD [address] [place], [place], [place], [address] [address], [place], [place], [place], [address] Phone: [phone]  Fax: [phone]  Primary Care Physician:  Tonia Ghent, MD   Pre-Procedure History & Physical: HPI:  [name] is a 83 y.o. female is here for an EGD.   Past Medical History:  Diagnosis Date  . Arthritis   . Asthma   . COPD (chronic obstructive pulmonary disease) ([place])   . Dysphonia   . Dyspnea   . Esophageal cancer ([place])   . GERD (gastroesophageal reflux disease)   . Heart murmur   . History of kidney stones   . Hypertension   . Melanoma ([place])   . OAB (overactive bladder)   . OSA on CPAP   . 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 Personal history of chemotherapy    esophageal cancer  . Personal history of radiation therapy    esophageal cancer  . Pupil asymmetry    R pupil defect  . Skin cancer   . Sleep apnea   . Spinal cord abscess     Past Surgical History:  Procedure Laterality Date  . ABDOMINAL HYSTERECTOMY    . ANTERIOR CERVICAL DECOMP/DISCECTOMY FUSION N/A 07/16/2013   Procedure: ANTERIOR CERVICAL DECOMPRESSION/DISCECTOMY FUSION mutlipleLEVELS C4-7;  Surgeon: Erline Levine, MD;  Location: [facility] NEURO ORS;  Service: Neurosurgery;  Laterality: N/A;  . APPENDECTOMY    . carpel tunn Bilateral   . CATARACT EXTRACTION    . CATARACT EXTRACTION, BILATERAL    . CHOLECYSTECTOMY    . dental implant    . ESOPHAGOGASTRODUODENOSCOPY Left 03/21/2017   Procedure: ESOPHAGOGASTRODUODENOSCOPY (EGD);  Surgeon: Virgel Manifold, MD;  Location: [facility] ENDOSCOPY;  Service: Endoscopy;  Laterality: Left;  . ESOPHAGOGASTRODUODENOSCOPY (EGD) WITH PROPOFOL N/A 08/20/2017   Procedure: ESOPHAGOGASTRODUODENOSCOPY (EGD) WITH PROPOFOL;  Surgeon: Virgel Manifold, MD;  Location: [facility] ENDOSCOPY;  Service: Endoscopy;  Laterality: N/A;  . GASTRIC BYPASS    . HIATAL HERNIA REPAIR    . MELANOMA EXCISION    . PORTA  CATH INSERTION N/A 04/07/2017   Procedure: PORTA CATH INSERTION;  Surgeon: Algernon Huxley, MD;  Location: [facility] CV LAB;  Service: Cardiovascular;  Laterality: N/A;  . POSTERIOR CERVICAL FUSION/FORAMINOTOMY N/A 07/28/2013   Procedure: Cervical four-seven  posterior cervical fusion;  Surgeon: Erline Levine, MD;  Location: [facility] NEURO ORS;  Service: Neurosurgery;  Laterality: N/A;  . TONSILLECTOMY    . TOTAL KNEE ARTHROPLASTY      Prior to Admission medications   Medication Sig Start Date End Date Taking? Authorizing Provider  acetaminophen (TYLENOL) 325 MG tablet Take 650 mg by mouth every 6 (six) hours as needed for moderate pain or headache.    Yes [provider]  ALBUTEROL SULFATE HFA IN Inhale into the lungs.  07/07/15  Yes [provider]  bisacodyl (DULCOLAX) 10 MG suppository Place rectally.   Yes [provider]  calcium carbonate (TUMS EX) 750 MG chewable tablet Chew by mouth.   Yes [provider]  Cholecalciferol (VITAMIN D) 2000 UNITS CAPS Take 2,000 Units by mouth daily.   Yes [provider]  cyanocobalamin 500 MCG tablet Take 500 mcg every other day by mouth.   Yes [provider]  Diclofenac Sodium 1 % CREA Apply to left arm as needed for pain three times daily 04/23/17  Yes Earlie Server, MD  fluticasone [facility]) 50 MCG/ACT nasal spray Place 1-2 sprays into both nostrils  daily. 06/13/17  Yes Tonia Ghent, MD  furosemide (LASIX) 20 MG tablet Take 1 tablet (20 mg total) by mouth daily. 08/14/17  Yes Minna Merritts, MD  hydrocortisone cream 1 % Apply 1 application topically daily as needed for itching.    Yes [provider]  lidocaine-prilocaine (EMLA) cream Apply to affected area once 04/10/17  Yes Earlie Server, MD  loperamide (IMODIUM) 2 MG capsule Take 1 capsule (2 mg total) by mouth See admin instructions. With onset of loose stool, take 4mg  followed by 2mg  every 2 hours until 12 hours have passed without loose bowel  movement. Maximum: 16 mg/day 04/14/17  Yes Earlie Server, MD  loratadine (CLARITIN) 10 MG tablet Take 10 mg by mouth daily.   Yes [provider]  LYRICA 150 MG capsule TAKE 1 CAPSULE BY MOUTH TWO TIMES DAILY 01/06/17  Yes Tonia Ghent, MD  metoprolol succinate (TOPROL-XL) 50 MG 24 hr tablet TAKE 1 TABLET BY MOUTH DAILY 01/14/17  Yes Tonia Ghent, MD  ondansetron (ZOFRAN) 8 MG tablet Take 1 tablet (8 mg total) by mouth 2 (two) times daily as needed for refractory nausea / vomiting. Start on day 3 after chemotherapy. 06/30/17  Yes Earlie Server, MD  oxybutynin (DITROPAN) 5 MG tablet TAKE 1/2 TABLET BY MOUTH IN THE MORNING AND 1 TABLET AT BEDTIME 03/27/17  Yes Tonia Ghent, MD  pantoprazole (PROTONIX) 40 MG tablet Take 1 tablet (40 mg total) by mouth 2 (two) times daily before a meal. 09/09/17 10/09/17 Yes Earlie Server, MD  Pediatric Multiple Vitamins (FLINTSTONES MULTIVITAMIN PO) Take by mouth.   Yes [provider]  Polyethyl Glycol-Propyl Glycol (SYSTANE OP) Apply 1 drop to eye daily as needed (dry eyes).   Yes [provider]  potassium chloride (KLOR-CON) 20 MEQ packet Take 20 mEq by mouth as needed (Daily as needed with fluid pill). 08/14/17  Yes Gollan, Kathlene November, MD  prochlorperazine (COMPAZINE) 10 MG tablet Take 1 tablet (10 mg total) by mouth every 6 (six) hours as needed (Nausea or vomiting). 06/30/17  Yes Earlie Server, MD  pyridOXINE (VITAMIN B-6) 100 MG tablet Take 1 tablet (100 mg total) by mouth daily. 04/28/17  Yes Earlie Server, MD  senna-docusate (SENOKOT-S) 8.6-50 MG tablet Take 1 tablet at bedtime as needed by mouth for mild constipation. 03/22/17  Yes Gouru, Illene Silver, MD  sucralfate (CARAFATE) 1 g tablet Take 1 tablet (1 g total) by mouth 3 (three) times daily before meals. 05/05/17  Yes Chrystal, Eulas Post, MD  traMADol (ULTRAM) 50 MG tablet Take 1 tablet (50 mg total) by mouth 3 (three) times daily as needed for moderate pain. 04/16/17  Yes Earlie Server, MD  traZODone (DESYREL) 50 MG  tablet TAKE 1/2 TABLET BY MOUTH AT BEDTIME IF NEEDED FOR SLEEP 04/17/17  Yes Tonia Ghent, MD    Allergies as of 09/16/2017 - Review Complete 09/16/2017  Allergen Reaction Noted  . Cefuroxime Nausea Only 07/11/2015  . Codeine Nausea Only 01/22/2011  . Doxycycline Other (See Comments) 12/03/2013  . Gabapentin Other (See Comments) 12/19/2016  . Iohexol Itching 07/15/2013  . Other Other (See Comments) 12/03/2013  . Oxycodone Other (See Comments) 12/19/2016  . Quinolones Swelling 12/03/2013  . Synvisc [hylan g-f 20] Swelling 01/22/2011    Family History  Problem Relation Age of Onset  . Breast cancer Mother 64  . Arthritis-Osteo Mother   . Breast cancer Sister 3  . Bladder Cancer Sister   . Bladder Cancer Father   .  Tongue cancer Father   . Congenital heart disease Father   . Prostate cancer Brother   . Uterine cancer Maternal Aunt   . Lung cancer Paternal Uncle   . Leukemia Maternal Grandmother   . Liver cancer Paternal Grandmother   . Colon cancer Neg Hx     Social History   Socioeconomic History  . Marital status: Widowed    Spouse name: Not on file  . Number of children: 2  . Years of education: Not on file  . Highest education level: Master's degree (e.g., MA, MS, MEng, MEd, MSW, MBA)  Occupational History  . Not on file  Social Needs  . Financial resource strain: Not hard at all  . Food insecurity:    Worry: Never true    Inability: Never true  . Transportation needs:    Medical: No    Non-medical: No  Tobacco Use  . Smoking status: Former Smoker    Packs/day: 1.00    Years: 20.00    Pack years: 20.00    Types: Cigarettes    Last attempt to quit: 1977    Years since quitting: 42.3  . Smokeless tobacco: Never Used  Substance and Sexual Activity  . Alcohol use: No  . Drug use: No  . Sexual activity: Never  Lifestyle  . Physical activity:    Days per week: 2 days    Minutes per session: 30 min  . Stress: Not at all  Relationships  . Social  connections:    Talks on phone: More than three times a week    Gets together: More than three times a week    Attends religious service: More than 4 times per year    Active member of club or organization: Yes    Attends meetings of clubs or organizations: More than 4 times per year    Relationship status: Widowed  . Intimate partner violence:    Fear of current or ex partner: No    Emotionally abused: No    Physically abused: No    Forced sexual activity: No  Other Topics Concern  . Not on file  Social History Narrative   Marital Status- Widowed    Lives by herself    Employement- Retired Pharmacist, hospital   Exercise hx- Does PT     Review of Systems: See HPI, otherwise negative ROS  Physical Exam: BP (!) 174/74   Pulse 60   Temp (!) 96.7 F (35.9 C) (Tympanic)   Resp 18   Ht 5\' 2"  (1.575 m)   Wt 219 lb (99.3 kg)   SpO2 96%   BMI 40.06 kg/m  General:   Alert,  pleasant and cooperative in NAD Head:  Normocephalic and atraumatic. Neck:  Supple; no masses or thyromegaly. Lungs:  Clear throughout to auscultation, normal respiratory effort.    Heart:  +S1, +S2, Regular rate and rhythm, No edema. Abdomen:  Soft, nontender and nondistended. Normal bowel sounds, without guarding, and without rebound.   Neurologic:  Alert and  oriented x4;  grossly normal neurologically.  Impression/Plan: Larry Sierras Lormand is here for an EGD for dysphagia  Risks, benefits, limitations, and alternatives regarding the procedure have been reviewed with the patient.  Questions have been answered.  All parties agreeable.   Virgel Manifold, MD  09/19/2017, 8:08 AM

## 2017-09-19 NOTE — Transfer of Care (Signed)
Immediate Anesthesia Transfer of Care Note  Patient: [name]  Procedure(s) Performed: ESOPHAGOGASTRODUODENOSCOPY (EGD) WITH PROPOFOL (N/A )  Patient Location: PACU and Endoscopy Unit  Anesthesia Type:General  Level of Consciousness: drowsy  Airway & Oxygen Therapy: Patient Spontanous Breathing and Patient connected to nasal cannula oxygen  Post-op Assessment: Report given to RN and Post -op Vital signs reviewed and stable  Post vital signs: Reviewed and stable  Last Vitals:  Vitals Value Taken Time  BP 141/69 09/19/2017  8:36 AM  Temp 36.2 C 09/19/2017  8:30 AM  Pulse 67 09/19/2017  8:37 AM  Resp 15 09/19/2017  8:37 AM  SpO2 98 % 09/19/2017  8:37 AM  Vitals shown include unvalidated device data.  Last Pain:  Vitals:   09/19/17 0830  TempSrc: Tympanic  PainSc:          Complications: No apparent anesthesia complications

## 2017-09-19 NOTE — Anesthesia Post-op Follow-up Note (Signed)
Anesthesia QCDR form completed.        

## 2017-09-19 NOTE — Op Note (Signed)
[facility] Gastroenterology Patient Name: [name] Procedure Date: 09/19/2017 8:06 AM MRN: [id number] Account #: [id number] Date of Birth: Dec 09, 1934 Admit Type: Outpatient Age: 83 Room: [facility] ENDO ROOM 2 Gender: Female Note Status: Finalized Procedure:            Upper GI endoscopy Indications:          Dysphagia, Malignant esophageal adenocarcinoma Providers:            Angelene Rome B. Bonna Gains MD, MD Referring MD:         Elveria Rising. Damita Dunnings, MD (Referring MD) Medicines:            Monitored Anesthesia Care Complications:        No immediate complications. Procedure:            Pre-Anesthesia Assessment:                       - The risks and benefits of the procedure and the                        sedation options and risks were discussed with the                        patient. All questions were answered and informed                        consent was obtained.                       - Patient identification and proposed procedure were                        verified prior to the procedure.                       - ASA Grade Assessment: III - A patient with severe                        systemic disease.                       After obtaining informed consent, the endoscope was                        passed under direct vision. Throughout the procedure,                        the patient's blood pressure, pulse, and oxygen                        saturations were monitored continuously. The Endoscope                        was introduced through the mouth, and advanced to the                        jejunum. The upper GI endoscopy was accomplished with                        ease. The patient tolerated the procedure well. Findings:      One severe (stenosis; an endoscope cannot pass) stenosis was found  34 to       35 cm from the incisors. This stenosis measured 1 cm (in length). The       stenosis was traversed after dilation. A TTS dilator was passed through      the scope. Dilation with a 02-21-11 mm balloon and a 12-13.5-15 mm       balloon dilator was performed to 13.5 mm. The dilation site was examined       and showed moderate improvement in luminal narrowing. The area was first       traversed with dilation to 37mm and the rest of the exam was completed.       Then the scope was withdrawn proximal to the area and dilation was       performed to 31mm. No mucosal tears or disruptions seen after dilation       to 12 mm. Since dilation was performed to 87mm during her april 10th       endoscopy as well, and no mucosal tears were seen after dilation to 43mm       on today's procedure, it was safe to proceed to dilation to 13.5 mm and       this was performed, with no mucosal tears, but good heme effect seen       after the procedure.      There is no endoscopic evidence of mass in the entire esophagus.      The entire examined stomach was normal.      The examined jejunum was normal. Impression:           - Esophageal stenosis. Dilated.                       - Normal stomach.                       - Normal examined jejunum.                       - No specimens collected. Recommendation:       - Discharge patient to home.                       - Soft diet.                       - Use Protonix (pantoprazole) 40 mg PO BID.                       - Follow an antireflux regimen.                       - Return to my office as previously scheduled.                       - Continue present medications.                       - The findings and recommendations were discussed with                        the patient.                       - The findings and recommendations were discussed with  the patient's family.                       - Return to primary care physician in 4 weeks. Procedure Code(s):    --- Professional ---                       [phone], Esophagogastroduodenoscopy, flexible, transoral;                        with  transendoscopic balloon dilation of esophagus                        (less than 30 mm diameter) Diagnosis Code(s):    --- Professional ---                       K22.2, Esophageal obstruction                       R13.10, Dysphagia, unspecified                       C15.9, Malignant neoplasm of esophagus, unspecified CPT copyright 2017 American Medical Association. All rights reserved. The codes documented in this report are preliminary and upon coder review may  be revised to meet current compliance requirements.  [name], MD Margretta Sidle B. Bonna Gains MD, MD 09/19/2017 8:40:24 AM This report has been signed electronically. Number of Addenda: 0 Note Initiated On: 09/19/2017 8:06 AM      [facility]

## 2017-09-23 ENCOUNTER — Inpatient Hospital Stay (HOSPITAL_BASED_OUTPATIENT_CLINIC_OR_DEPARTMENT_OTHER): Payer: Medicare Other | Admitting: Oncology

## 2017-09-23 ENCOUNTER — Inpatient Hospital Stay: Payer: Medicare Other

## 2017-09-23 ENCOUNTER — Encounter: Payer: Self-pay | Admitting: Oncology

## 2017-09-23 ENCOUNTER — Other Ambulatory Visit: Payer: Self-pay

## 2017-09-23 VITALS — BP 148/79 | HR 58 | Temp 97.4°F | Resp 18 | Wt 221.5 lb

## 2017-09-23 DIAGNOSIS — D509 Iron deficiency anemia, unspecified: Secondary | ICD-10-CM

## 2017-09-23 DIAGNOSIS — Z803 Family history of malignant neoplasm of breast: Secondary | ICD-10-CM

## 2017-09-23 DIAGNOSIS — Z8042 Family history of malignant neoplasm of prostate: Secondary | ICD-10-CM

## 2017-09-23 DIAGNOSIS — G629 Polyneuropathy, unspecified: Secondary | ICD-10-CM | POA: Diagnosis not present

## 2017-09-23 DIAGNOSIS — Z923 Personal history of irradiation: Secondary | ICD-10-CM | POA: Diagnosis not present

## 2017-09-23 DIAGNOSIS — Z808 Family history of malignant neoplasm of other organs or systems: Secondary | ICD-10-CM

## 2017-09-23 DIAGNOSIS — C155 Malignant neoplasm of lower third of esophagus: Secondary | ICD-10-CM | POA: Diagnosis not present

## 2017-09-23 DIAGNOSIS — J449 Chronic obstructive pulmonary disease, unspecified: Secondary | ICD-10-CM

## 2017-09-23 DIAGNOSIS — Z800 Family history of malignant neoplasm of digestive organs: Secondary | ICD-10-CM

## 2017-09-23 DIAGNOSIS — R1319 Other dysphagia: Secondary | ICD-10-CM

## 2017-09-23 DIAGNOSIS — R5383 Other fatigue: Secondary | ICD-10-CM

## 2017-09-23 DIAGNOSIS — M1990 Unspecified osteoarthritis, unspecified site: Secondary | ICD-10-CM | POA: Diagnosis not present

## 2017-09-23 DIAGNOSIS — R5381 Other malaise: Secondary | ICD-10-CM | POA: Diagnosis not present

## 2017-09-23 DIAGNOSIS — Z85820 Personal history of malignant melanoma of skin: Secondary | ICD-10-CM

## 2017-09-23 DIAGNOSIS — R1310 Dysphagia, unspecified: Secondary | ICD-10-CM

## 2017-09-23 DIAGNOSIS — Z5111 Encounter for antineoplastic chemotherapy: Secondary | ICD-10-CM

## 2017-09-23 DIAGNOSIS — Z806 Family history of leukemia: Secondary | ICD-10-CM

## 2017-09-23 DIAGNOSIS — K219 Gastro-esophageal reflux disease without esophagitis: Secondary | ICD-10-CM | POA: Diagnosis not present

## 2017-09-23 DIAGNOSIS — Z8052 Family history of malignant neoplasm of bladder: Secondary | ICD-10-CM

## 2017-09-23 DIAGNOSIS — C787 Secondary malignant neoplasm of liver and intrahepatic bile duct: Secondary | ICD-10-CM | POA: Diagnosis not present

## 2017-09-23 DIAGNOSIS — Z87891 Personal history of nicotine dependence: Secondary | ICD-10-CM

## 2017-09-23 DIAGNOSIS — I10 Essential (primary) hypertension: Secondary | ICD-10-CM | POA: Diagnosis not present

## 2017-09-23 DIAGNOSIS — Z79899 Other long term (current) drug therapy: Secondary | ICD-10-CM | POA: Diagnosis not present

## 2017-09-23 LAB — COMPREHENSIVE METABOLIC PANEL
ALBUMIN: 3.5 g/dL (ref 3.5–5.0)
ALT: 17 U/L (ref 14–54)
AST: 37 U/L (ref 15–41)
Alkaline Phosphatase: 68 U/L (ref 38–126)
Anion gap: 10 (ref 5–15)
BUN: 13 mg/dL (ref 6–20)
CO2: 27 mmol/L (ref 22–32)
CREATININE: 0.77 mg/dL (ref 0.44–1.00)
Calcium: 8.8 mg/dL — ABNORMAL LOW (ref 8.9–10.3)
Chloride: 105 mmol/L (ref 101–111)
GFR calc non Af Amer: >60 mL/min (ref >60)
GLUCOSE: 85 mg/dL (ref 65–99)
Potassium: 3.3 mmol/L — ABNORMAL LOW (ref 3.5–5.1)
SODIUM: 142 mmol/L (ref 135–145)
Total Bilirubin: 0.8 mg/dL (ref 0.3–1.2)
Total Protein: 6.2 g/dL — ABNORMAL LOW (ref 6.5–8.1)

## 2017-09-23 LAB — CBC WITH DIFFERENTIAL/PLATELET
Basophils Absolute: 0 10*3/uL (ref 0–0.1)
Basophils Relative: 1 %
EOS ABS: 0.2 10*3/uL (ref 0–0.7)
Eosinophils Relative: 6 %
HEMATOCRIT: 33.9 % — AB (ref 35.0–47.0)
HEMOGLOBIN: 11.9 g/dL — AB (ref 12.0–16.0)
LYMPHS ABS: 0.4 10*3/uL — AB (ref 1.0–3.6)
Lymphocytes Relative: 15 %
MCH: 34.5 pg — AB (ref 26.0–34.0)
MCHC: 35 g/dL (ref 32.0–36.0)
MCV: 98.5 fL (ref 80.0–100.0)
MONOS PCT: 15 %
Monocytes Absolute: 0.5 10*3/uL (ref 0.2–0.9)
NEUTROS PCT: 63 %
Neutro Abs: 1.9 10*3/uL (ref 1.4–6.5)
Platelets: 127 10*3/uL — ABNORMAL LOW (ref 150–440)
RBC: 3.44 MIL/uL — ABNORMAL LOW (ref 3.80–5.20)
RDW: 15.6 % — ABNORMAL HIGH (ref 11.5–14.5)
WBC: 3 10*3/uL — ABNORMAL LOW (ref 3.6–11.0)

## 2017-09-23 LAB — FERRITIN: Ferritin: 45 ng/mL (ref 11–307)

## 2017-09-23 LAB — IRON AND TIBC
Iron: 76 ug/dL (ref 28–170)
SATURATION RATIOS: 22 % (ref 10.4–31.8)
TIBC: 339 ug/dL (ref 250–450)
UIBC: 263 ug/dL

## 2017-09-23 LAB — FOLATE: FOLATE: 31 ng/mL (ref >5.9)

## 2017-09-23 MED ORDER — LEUCOVORIN CALCIUM INJECTION 350 MG: 400.0000 mg/m2 | Freq: Once | INTRAVENOUS | Status: DC

## 2017-09-23 MED ORDER — SODIUM CHLORIDE 0.9% FLUSH
10.0000 mL | INTRAVENOUS | Status: DC | PRN
  Administered 2017-09-23: 10 mL via INTRAVENOUS
  Filled 2017-09-23: qty 10

## 2017-09-23 MED ORDER — FLUOROURACIL CHEMO INJECTION 2.5 GM/50ML
400.0000 mg/m2 | Freq: Once | INTRAVENOUS | Status: AC
  Administered 2017-09-23: 850 mg via INTRAVENOUS
  Filled 2017-09-23: qty 17

## 2017-09-23 MED ORDER — LEUCOVORIN CALCIUM INJECTION 100 MG
20.0000 mg/m2 | Freq: Once | INTRAMUSCULAR | Status: AC
  Administered 2017-09-23: 44 mg via INTRAVENOUS
  Filled 2017-09-23: qty 2.2

## 2017-09-23 MED ORDER — SODIUM CHLORIDE 0.9 % IV SOLN
2400.0000 mg/m2 | INTRAVENOUS | Status: DC
  Administered 2017-09-23: 5200 mg via INTRAVENOUS
  Filled 2017-09-23: qty 100

## 2017-09-23 NOTE — Progress Notes (Signed)
Pt in for follow up and treatment today.  States doing well except for lower back pain.

## 2017-09-23 NOTE — Progress Notes (Signed)
Hematology/Oncology Follow Up Note [facility]  [phone] [phone]  Patient Care Team: Tonia Ghent, MD as PCP - General (Family Medicine)   Name of the patient: [name]  [id number]  [birth year]   REASON FOR VISIT Follow up of management of esophageal adenocarcinoma, assessment for chemotherapy.   HISTORY OF PRESENT ILLNESS Patient is a 83 year old female who has metastatic esophageal cancer with liver involvement.  Weight loss 20 pounds for the past year prior to diagnosis.  # EGD during admission showed partially obstructive esophageal mass at the distal part of esophagus. Biopsy was taken. Pathology showed esophageal adenocarcinoma. #US biopsy of liver lesion to proof distant metastasis.  Report family history of breast cancer, but she has been having routine mammograms.  # Lives alone. She's a former smoker.  # Molecular Testing:  #[employer] Cdx: No reportable alternations with companion diagnostic (CDx) claims.  MS stable Tumor mutation burden: Cannot be determined, CCND3 amplification: Equivocal.  Amended reports on 06/04/2017, Tumor mutational burden changed from can not be determined to "TMB -low" on the re-analyzed samples.  # HER 2 negative.   Current Treatment S/p 6 cycles of FOLFOX, PET scan showed excellent partial response from both chemotherapy and radiation, result was discussed with patient.  currently on 5-FU maintenance.  S/p palliative radiation in January 2019 # Developed esophageal stricture secondary to radiation in April and got dilation via endoscopy. EGD shoed Benign-appearing esophageal stenosis. This is a result of radiation therapy.- LA Grade C radiation esophagitis. - The previously noted esophageal mass in Nov 2018 was much decreased indicating good response to radiation/chemotherapy. - Normal stomach.- Normal examined jejunum.- No specimens collected.    INTERVAL HISTORY  Patient presents  for evaluation prior to Cycle 6 Maintenance 5-FU chemotherapy for esophageal adenocarcinoma.  During the interval, she has had another EGD and was found to have severe stenosis 34 to 35 cm from the incisors, which was dilated by Dr. Bonna Gains.  Today patient reports dysphagia has resolved. Able to eat well and gained one pounds.   She takes protonix 20m BID. Feels well, mild fatigue. Denies diarrhea.  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     Review of systems Review of Systems  Constitutional: Positive for malaise/fatigue. Negative for chills, diaphoresis, fever and weight loss.  HENT: Negative for congestion, ear discharge, ear pain, hearing loss, nosebleeds, sinus pain and sore throat.   Eyes: Negative for double vision, photophobia, pain, discharge and redness.  Respiratory: Negative for cough, hemoptysis, sputum production, shortness of breath and wheezing.   Cardiovascular: Negative for chest pain, palpitations, orthopnea, claudication and leg swelling.  Gastrointestinal: Negative for abdominal pain, blood in stool, constipation, diarrhea, heartburn, melena, nausea and vomiting.  Genitourinary: Negative for dysuria, flank pain, frequency, hematuria and urgency.  Musculoskeletal: Negative for back pain, joint pain, myalgias and neck pain.  Skin: Negative for itching and rash.  Neurological: Positive for tingling and sensory change. Negative for dizziness, tremors, focal weakness, weakness and headaches.  Endo/Heme/Allergies: Negative for environmental allergies. Does not bruise/bleed easily.  Psychiatric/Behavioral: Negative for depression, hallucinations, substance abuse and suicidal ideas. The patient is not nervous/anxious.     Allergies  Allergen Reactions  . Cefuroxime Nausea Only  . Codeine Nausea Only  . Doxycycline Other (See Comments)    Lip swelling  . Gabapentin Other (See Comments)    Swelling.   . Iohexol Itching      07-15-13 pt developed itching  on fingers after contrast given. Dr. Irish Elders looked at pt and said to put in system as allergy. BB  . Other Other (See Comments)    Contrast Dye- caused fever, chills, awful feeling Bandages - itching, rash  . Oxycodone Other (See Comments)    nausea  . Quinolones Swelling    Lip swelling  . Synvisc [Hylan G-F 20] Swelling     Past Medical History:  Diagnosis Date  . Arthritis   . Asthma   . COPD (chronic obstructive pulmonary disease) ([place])   . Dysphonia   . Dyspnea   . Esophageal cancer ([place])   . GERD (gastroesophageal reflux disease)   . Heart murmur   . History of kidney stones   . Hypertension   . Melanoma ([place])   . OAB (overactive bladder)   . OSA on CPAP   . 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 Personal history of chemotherapy    esophageal cancer  . Personal history of radiation therapy    esophageal cancer  . Pupil asymmetry    R pupil defect  . Skin cancer   . Sleep apnea   . Spinal cord abscess      Past Surgical History:  Procedure Laterality Date  . ABDOMINAL HYSTERECTOMY    . ANTERIOR CERVICAL DECOMP/DISCECTOMY FUSION N/A 07/16/2013   Procedure: ANTERIOR CERVICAL DECOMPRESSION/DISCECTOMY FUSION mutlipleLEVELS C4-7;  Surgeon: Erline Levine, MD;  Location: [place] NEURO ORS;  Service: Neurosurgery;  Laterality: N/A;  . APPENDECTOMY    . carpel tunn Bilateral   . CATARACT EXTRACTION    . CATARACT EXTRACTION, BILATERAL    . CHOLECYSTECTOMY    . dental implant    . ESOPHAGOGASTRODUODENOSCOPY Left 03/21/2017   Procedure: ESOPHAGOGASTRODUODENOSCOPY (EGD);  Surgeon: Virgel Manifold, MD;  Location: [facility] ENDOSCOPY;  Service: Endoscopy;  Laterality: Left;  . ESOPHAGOGASTRODUODENOSCOPY (EGD) WITH PROPOFOL N/A 08/20/2017   Procedure: ESOPHAGOGASTRODUODENOSCOPY (EGD) WITH PROPOFOL;  Surgeon: Virgel Manifold, MD;  Location: [facility] ENDOSCOPY;  Service: Endoscopy;  Laterality: N/A;  . GASTRIC BYPASS    . HIATAL HERNIA  REPAIR    . MELANOMA EXCISION    . PORTA CATH INSERTION N/A 04/07/2017   Procedure: PORTA CATH INSERTION;  Surgeon: Algernon Huxley, MD;  Location: [place] CV LAB;  Service: Cardiovascular;  Laterality: N/A;  . POSTERIOR CERVICAL FUSION/FORAMINOTOMY N/A 07/28/2013   Procedure: Cervical four-seven  posterior cervical fusion;  Surgeon: Erline Levine, MD;  Location: [place] NEURO ORS;  Service: Neurosurgery;  Laterality: N/A;  . TONSILLECTOMY    . TOTAL KNEE ARTHROPLASTY      Social History   Socioeconomic History  . Marital status: Widowed    Spouse name: Not on file  . Number of children: 2  . Years of education: Not on file  . Highest education level: Master's degree (e.g., MA, MS, MEng, MEd, MSW, MBA)  Occupational History  . Not on file  Social Needs  . Financial resource strain: Not hard at all  . Food insecurity:    Worry: Never true    Inability: Never true  . Transportation needs:    Medical: No    Non-medical: No  Tobacco Use  . Smoking status: Former Smoker    Packs/day: 1.00    Years: 20.00    Pack years: 20.00    Types: Cigarettes    Last attempt to quit: 1977    Years since quitting: 42.3  . Smokeless tobacco: Never Used  Substance and Sexual Activity  .  Alcohol use: No  . Drug use: No  . Sexual activity: Never  Lifestyle  . Physical activity:    Days per week: 2 days    Minutes per session: 30 min  . Stress: Not at all  Relationships  . Social connections:    Talks on phone: More than three times a week    Gets together: More than three times a week    Attends religious service: More than 4 times per year    Active member of club or organization: Yes    Attends meetings of clubs or organizations: More than 4 times per year    Relationship status: Widowed  . Intimate partner violence:    Fear of current or ex partner: No    Emotionally abused: No    Physically abused: No    Forced sexual activity: No  Other Topics Concern  . Not on file  Social  History Narrative   Marital Status- Widowed    Lives by herself    Employement- Retired Pharmacist, hospital   Exercise hx- Does PT     Family History  Problem Relation Age of Onset  . Breast cancer Mother 89  . Arthritis-Osteo Mother   . Breast cancer Sister 38  . Bladder Cancer Sister   . Bladder Cancer Father   . Tongue cancer Father   . Congenital heart disease Father   . Prostate cancer Brother   . Uterine cancer Maternal Aunt   . Lung cancer Paternal Uncle   . Leukemia Maternal Grandmother   . Liver cancer Paternal Grandmother   . Colon cancer Neg Hx      Current Outpatient Medications:  .  acetaminophen (TYLENOL) 325 MG tablet, Take 650 mg by mouth every 6 (six) hours as needed for moderate pain or headache. , Disp: , Rfl:  .  bisacodyl (DULCOLAX) 10 MG suppository, Place rectally., Disp: , Rfl:  .  calcium carbonate (TUMS EX) 750 MG chewable tablet, Chew by mouth., Disp: , Rfl:  .  Cholecalciferol (VITAMIN D) 2000 UNITS CAPS, Take 2,000 Units by mouth daily., Disp: , Rfl:  .  cyanocobalamin 500 MCG tablet, Take 500 mcg every other day by mouth., Disp: , Rfl:  .  Diclofenac Sodium 1 % CREA, Apply to left arm as needed for pain three times daily, Disp: 120 g, Rfl: 0 .  fluticasone (FLONASE) 50 MCG/ACT nasal spray, Place 1-2 sprays into both nostrils daily., Disp: 16 g, Rfl: 5 .  furosemide (LASIX) 20 MG tablet, Take 1 tablet (20 mg total) by mouth daily., Disp: 90 tablet, Rfl: 3 .  hydrocortisone cream 1 %, Apply 1 application topically daily as needed for itching. , Disp: , Rfl:  .  lidocaine-prilocaine (EMLA) cream, Apply to affected area once, Disp: 30 g, Rfl: 3 .  loratadine (CLARITIN) 10 MG tablet, Take 10 mg by mouth daily., Disp: , Rfl:  .  LYRICA 150 MG capsule, TAKE 1 CAPSULE BY MOUTH TWO TIMES DAILY, Disp: 180 capsule, Rfl: 1 .  metoprolol succinate (TOPROL-XL) 50 MG 24 hr tablet, TAKE 1 TABLET BY MOUTH DAILY, Disp: 90 tablet, Rfl: 3 .  ondansetron (ZOFRAN) 8 MG tablet, Take  1 tablet (8 mg total) by mouth 2 (two) times daily as needed for refractory nausea / vomiting. Start on day 3 after chemotherapy., Disp: 30 tablet, Rfl: 1 .  oxybutynin (DITROPAN) 5 MG tablet, TAKE 1/2 TABLET BY MOUTH IN THE MORNING AND 1 TABLET AT BEDTIME, Disp: , Rfl:  .  pantoprazole (PROTONIX) 40 MG tablet, Take 1 tablet (40 mg total) by mouth 2 (two) times daily before a meal., Disp: 60 tablet, Rfl: 1 .  Pediatric Multiple Vitamins (FLINTSTONES MULTIVITAMIN PO), Take by mouth., Disp: , Rfl:  .  Polyethyl Glycol-Propyl Glycol (SYSTANE OP), Apply 1 drop to eye daily as needed (dry eyes)., Disp: , Rfl:  .  prochlorperazine (COMPAZINE) 10 MG tablet, Take 1 tablet (10 mg total) by mouth every 6 (six) hours as needed (Nausea or vomiting)., Disp: 30 tablet, Rfl: 1 .  pyridOXINE (VITAMIN B-6) 100 MG tablet, Take 1 tablet (100 mg total) by mouth daily., Disp: 30 tablet, Rfl: 3 .  senna-docusate (SENOKOT-S) 8.6-50 MG tablet, Take 1 tablet at bedtime as needed by mouth for mild constipation., Disp: , Rfl:  .  sucralfate (CARAFATE) 1 GM/10ML suspension, Take 10 mLs (1 g total) by mouth 4 (four) times daily., Disp: 420 mL, Rfl: 1 .  traMADol (ULTRAM) 50 MG tablet, Take 1 tablet (50 mg total) by mouth 3 (three) times daily as needed for moderate pain., Disp: 30 tablet, Rfl: 1 .  traZODone (DESYREL) 50 MG tablet, TAKE 1/2 TABLET BY MOUTH AT BEDTIME IF NEEDED FOR SLEEP, Disp: 45 tablet, Rfl: 3 .  ALBUTEROL SULFATE HFA IN, Inhale into the lungs. , Disp: , Rfl:  .  loperamide (IMODIUM) 2 MG capsule, Take 1 capsule (2 mg total) by mouth See admin instructions. With onset of loose stool, take 29m followed by 239mevery 2 hours until 12 hours have passed without loose bowel movement. Maximum: 16 mg/day (Patient not taking: Reported on 09/23/2017), Disp: 120 capsule, Rfl: 0 .  potassium chloride (KLOR-CON) 20 MEQ packet, Take 20 mEq by mouth as needed (Daily as needed with fluid pill). (Patient not taking: Reported on  09/23/2017), Disp: 30 packet, Rfl: 1 No current facility-administered medications for this visit.   Facility-Administered Medications Ordered in Other Visits:  .  fluorouracil (ADRUCIL) 5,200 mg in sodium chloride 0.9 % 146 mL chemo infusion, 2,400 mg/m2 (Treatment Plan Recorded), Intravenous, 1 day or 1 dose, YuEarlie ServerMD, 5,200 mg at 09/23/17 1025 .  sodium chloride flush (NS) 0.9 % injection 10 mL, 10 mL, Intravenous, PRN, YuEarlie ServerMD, 10 mL at 09/23/17 0818  Physical exam:  Vitals:   09/23/17 0855  BP: (!) 148/79  Pulse: (!) 58  Resp: 18  Temp: (!) 97.4 F (36.3 C)  TempSrc: Tympanic  Weight: 221 lb 8 oz (100.5 kg)  ECOG 1 Physical Exam  Constitutional: She is oriented to person, place, and time and well-developed, well-nourished, and in no distress. No distress.  HENT:  Head: Normocephalic and atraumatic.  Nose: Nose normal.  Mouth/Throat: Oropharynx is clear and moist. No oropharyngeal exudate.  Eyes: Pupils are equal, round, and reactive to light. Conjunctivae and EOM are normal. Left eye exhibits no discharge. No scleral icterus.  Neck: Normal range of motion. Neck supple. No JVD present.  Cardiovascular: Normal rate and regular rhythm.  Murmur heard. Pulmonary/Chest: Effort normal and breath sounds normal. No respiratory distress. She has no wheezes. She has no rales. She exhibits no tenderness.  Abdominal: Soft. Bowel sounds are normal. She exhibits no distension and no mass. There is no tenderness. There is no rebound and no guarding.  Musculoskeletal: Normal range of motion. She exhibits no edema, tenderness or deformity.  Lymphadenopathy:    She has no cervical adenopathy.  Neurological: She is alert and oriented to person, place, and time. No cranial nerve deficit. She  exhibits normal muscle tone. Coordination normal.  Skin: Skin is warm and dry. No rash noted. She is not diaphoretic. No erythema.  Psychiatric: Affect and judgment normal.    CMP Latest Ref Rng &  Units 09/23/2017  Glucose 65 - 99 mg/dL 85  BUN 6 - 20 mg/dL 13  Creatinine 0.44 - 1.00 mg/dL 0.77  Sodium 135 - 145 mmol/L 142  Potassium 3.5 - 5.1 mmol/L 3.3(L)  Chloride 101 - 111 mmol/L 105  CO2 22 - 32 mmol/L 27  Calcium 8.9 - 10.3 mg/dL 8.8(L)  Total Protein 6.5 - 8.1 g/dL 6.2(L)  Total Bilirubin 0.3 - 1.2 mg/dL 0.8  Alkaline Phos 38 - 126 U/L 68  AST 15 - 41 U/L 37  ALT 14 - 54 U/L 17   CBC Latest Ref Rng & Units 09/23/2017  WBC 3.6 - 11.0 K/uL 3.0(L)  Hemoglobin 12.0 - 16.0 g/dL 11.9(L)  Hematocrit 35.0 - 47.0 % 33.9(L)  Platelets 150 - 440 K/uL 127(L)    Assessment and plan  Cancer Staging Malignant neoplasm of lower third of esophagus (HCC) Staging form: Esophagus - Adenocarcinoma, AJCC 8th Edition - Clinical stage from 04/10/2017: Stage IVB (cTX, cNX, pM1) - Signed by Earlie Server, MD on 04/10/2017  1. Malignant neoplasm of lower third of esophagus (HCC)   2. Esophageal dysphagia   3. Encounter for antineoplastic chemotherapy    # Proceed with cycle 6  maintenance 5-FU bolus and 48-hour infusion. Tolerates well. Plan repeat PET scan after next treatment.   # Esophageal Stricture, s/ dilation via endoscopy. Continue protonix 58m BID, r  # Iron deficiency anemia:monitor iron level periodically. Check iron ferritin today.  # Pre-existing peripheral neuropathy:Continue lyrica and B6 supplementation  Follow up in 2 week for assessment prior to next cycle of 5-FU maintenance treatment.  Patient knows to call if any concerns or questions.   ZEarlie Server MD, PhD Hematology Oncology CEncompass Health Rehabilitation Hospital Of Texarkanaat ATripoint Medical CenterPager- [phone]/14/19

## 2017-09-24 ENCOUNTER — Encounter: Payer: Self-pay | Admitting: Gastroenterology

## 2017-09-24 LAB — CEA: CEA1: 2.1 ng/mL (ref 0.0–4.7)

## 2017-09-25 ENCOUNTER — Inpatient Hospital Stay: Payer: Medicare Other

## 2017-09-25 VITALS — BP 149/88 | HR 72 | Temp 97.3°F | Resp 20

## 2017-09-25 DIAGNOSIS — C155 Malignant neoplasm of lower third of esophagus: Secondary | ICD-10-CM

## 2017-09-25 MED ORDER — HEPARIN SOD (PORK) LOCK FLUSH 100 UNIT/ML IV SOLN
500.0000 [IU] | Freq: Once | INTRAVENOUS | Status: AC | PRN
  Administered 2017-09-25: 500 [IU]
  Filled 2017-09-25: qty 5

## 2017-09-25 MED ORDER — SODIUM CHLORIDE 0.9% FLUSH
10.0000 mL | INTRAVENOUS | Status: DC | PRN
  Administered 2017-09-25: 10 mL
  Filled 2017-09-25: qty 10

## 2017-10-02 ENCOUNTER — Telehealth: Payer: Self-pay | Admitting: Gastroenterology

## 2017-10-02 NOTE — Telephone Encounter (Signed)
Pt is calling she states she had EGD  And feels like her esophagist is closing back up she states she was unable to eat apple sauce or yorgurt for breakfast and would like too see if there is any rx she can take to relax the muscle so she take her other rx for athritis or maybe repeat the procedure. Please call pt

## 2017-10-03 ENCOUNTER — Other Ambulatory Visit: Payer: Self-pay

## 2017-10-03 ENCOUNTER — Ambulatory Visit
Admission: RE | Admit: 2017-10-03 | Discharge: 2017-10-03 | Disposition: A | Discharge status: Home / Self Care | Payer: Medicare Other | Source: Ambulatory Visit | Attending: Gastroenterology | Admitting: Gastroenterology

## 2017-10-03 ENCOUNTER — Ambulatory Visit: Payer: Medicare Other | Admitting: Anesthesiology

## 2017-10-03 ENCOUNTER — Encounter
Admission: RE | Disposition: A | Discharge status: Home / Self Care | Payer: Self-pay | Source: Ambulatory Visit | Attending: Gastroenterology

## 2017-10-03 DIAGNOSIS — N3281 Overactive bladder: Secondary | ICD-10-CM | POA: Diagnosis not present

## 2017-10-03 DIAGNOSIS — B3781 Candidal esophagitis: Secondary | ICD-10-CM | POA: Diagnosis not present

## 2017-10-03 DIAGNOSIS — K9289 Other specified diseases of the digestive system: Secondary | ICD-10-CM

## 2017-10-03 DIAGNOSIS — K210 Gastro-esophageal reflux disease with esophagitis: Secondary | ICD-10-CM | POA: Insufficient documentation

## 2017-10-03 DIAGNOSIS — M1990 Unspecified osteoarthritis, unspecified site: Secondary | ICD-10-CM | POA: Insufficient documentation

## 2017-10-03 DIAGNOSIS — I10 Essential (primary) hypertension: Secondary | ICD-10-CM | POA: Insufficient documentation

## 2017-10-03 DIAGNOSIS — Z87891 Personal history of nicotine dependence: Secondary | ICD-10-CM | POA: Diagnosis not present

## 2017-10-03 DIAGNOSIS — K222 Esophageal obstruction: Secondary | ICD-10-CM | POA: Diagnosis not present

## 2017-10-03 DIAGNOSIS — Z8501 Personal history of malignant neoplasm of esophagus: Secondary | ICD-10-CM | POA: Insufficient documentation

## 2017-10-03 DIAGNOSIS — Z79899 Other long term (current) drug therapy: Secondary | ICD-10-CM | POA: Diagnosis not present

## 2017-10-03 DIAGNOSIS — R1310 Dysphagia, unspecified: Secondary | ICD-10-CM | POA: Insufficient documentation

## 2017-10-03 DIAGNOSIS — Z923 Personal history of irradiation: Secondary | ICD-10-CM | POA: Diagnosis not present

## 2017-10-03 DIAGNOSIS — Z9221 Personal history of antineoplastic chemotherapy: Secondary | ICD-10-CM | POA: Insufficient documentation

## 2017-10-03 DIAGNOSIS — J449 Chronic obstructive pulmonary disease, unspecified: Secondary | ICD-10-CM | POA: Diagnosis not present

## 2017-10-03 DIAGNOSIS — I739 Peripheral vascular disease, unspecified: Secondary | ICD-10-CM | POA: Insufficient documentation

## 2017-10-03 DIAGNOSIS — Z7951 Long term (current) use of inhaled steroids: Secondary | ICD-10-CM | POA: Diagnosis not present

## 2017-10-03 DIAGNOSIS — K2289 Other specified disease of esophagus: Secondary | ICD-10-CM

## 2017-10-03 DIAGNOSIS — R1319 Other dysphagia: Secondary | ICD-10-CM

## 2017-10-03 DIAGNOSIS — Z85820 Personal history of malignant melanoma of skin: Secondary | ICD-10-CM | POA: Insufficient documentation

## 2017-10-03 DIAGNOSIS — I2510 Atherosclerotic heart disease of native coronary artery without angina pectoris: Secondary | ICD-10-CM | POA: Diagnosis not present

## 2017-10-03 DIAGNOSIS — K228 Other specified diseases of esophagus: Secondary | ICD-10-CM

## 2017-10-03 DIAGNOSIS — G4730 Sleep apnea, unspecified: Secondary | ICD-10-CM | POA: Insufficient documentation

## 2017-10-03 HISTORY — PX: ESOPHAGOGASTRODUODENOSCOPY (EGD) WITH PROPOFOL: SHX5813

## 2017-10-03 LAB — KOH PREP: Special Requests: NORMAL

## 2017-10-03 SURGERY — ESOPHAGOGASTRODUODENOSCOPY (EGD) WITH PROPOFOL
Anesthesia: General

## 2017-10-03 MED ORDER — PROPOFOL 500 MG/50ML IV EMUL
INTRAVENOUS | Status: AC
  Filled 2017-10-03: qty 50

## 2017-10-03 MED ORDER — SODIUM CHLORIDE 0.9 % IV SOLN
INTRAVENOUS | Status: DC
  Administered 2017-10-03: 14:00:00 via INTRAVENOUS

## 2017-10-03 MED ORDER — ONDANSETRON HCL 4 MG/2ML IJ SOLN
INTRAMUSCULAR | Status: AC
  Filled 2017-10-03: qty 2

## 2017-10-03 MED ORDER — LIDOCAINE HCL (PF) 2 % IJ SOLN
INTRAMUSCULAR | Status: AC
  Filled 2017-10-03: qty 10

## 2017-10-03 MED ORDER — PROPOFOL 10 MG/ML IV BOLUS
INTRAVENOUS | Status: DC | PRN
  Administered 2017-10-03: 30 mg via INTRAVENOUS
  Administered 2017-10-03: 90 mg via INTRAVENOUS
  Administered 2017-10-03: 30 mg via INTRAVENOUS

## 2017-10-03 NOTE — Anesthesia Postprocedure Evaluation (Signed)
Anesthesia Post Note  Patient: [name]  Procedure(s) Performed: ESOPHAGOGASTRODUODENOSCOPY (EGD) WITH PROPOFOL (N/A )  Patient location during evaluation: Endoscopy Anesthesia Type: General Level of consciousness: awake and alert Pain management: pain level controlled Vital Signs Assessment: post-procedure vital signs reviewed and stable Respiratory status: spontaneous breathing, nonlabored ventilation, respiratory function stable and patient connected to nasal cannula oxygen Cardiovascular status: blood pressure returned to baseline and stable Postop Assessment: no apparent nausea or vomiting Anesthetic complications: no     Last Vitals:  Vitals:   10/03/17 1305 10/03/17 1452  BP: 125/60 139/64  Pulse: (!) 54 68  Resp: 18 18  Temp: 36.8 C (!) 36.3 C  SpO2: 96% 97%    Last Pain:  Vitals:   10/03/17 1502  TempSrc:   PainSc: 0-No pain                 Makynli Stills S

## 2017-10-03 NOTE — Anesthesia Preprocedure Evaluation (Signed)
Anesthesia Evaluation  Patient identified by MRN, date of birth, ID band Patient awake    Reviewed: Allergy & Precautions, NPO status , Patient's Chart, lab work & pertinent test results, reviewed documented beta blocker date and time   History of Anesthesia Complications Negative for: history of anesthetic complications  Airway Mallampati: II       Dental   Pulmonary asthma , sleep apnea and Continuous Positive Airway Pressure Ventilation , COPD,  COPD inhaler, former smoker,           Cardiovascular hypertension, Pt. on medications and Pt. on home beta blockers + CAD and + Peripheral Vascular Disease  (-) Past MI and (-) CHF (-) dysrhythmias + Valvular Problems/Murmurs (murmur, no tx)      Neuro/Psych neg Seizures    GI/Hepatic Neg liver ROS, GERD  Medicated,  Endo/Other  neg diabetes  Renal/GU negative Renal ROS     Musculoskeletal   Abdominal   Peds  Hematology   Anesthesia Other Findings   Reproductive/Obstetrics                             Anesthesia Physical Anesthesia Plan  ASA: III  Anesthesia Plan: General   Post-op Pain Management:    Induction:   PONV Risk Score and Plan: 3 and Propofol infusion, TIVA and Treatment may vary due to age or medical condition  Airway Management Planned: Nasal Cannula  Additional Equipment:   Intra-op Plan:   Post-operative Plan:   Informed Consent: I have reviewed the patients History and Physical, chart, labs and discussed the procedure including the risks, benefits and alternatives for the proposed anesthesia with the patient or authorized representative who has indicated his/her understanding and acceptance.     Plan Discussed with:   Anesthesia Plan Comments:         Anesthesia Quick Evaluation

## 2017-10-03 NOTE — Op Note (Signed)
[facility] Gastroenterology Patient Name: [name] Procedure Date: 10/03/2017 2:26 PM MRN: [id number] Account #: [id number] Date of Birth: 1934/09/28 Admit Type: Outpatient Age: 83 Room: [facility] ENDO ROOM 2 Gender: Female Note Status: Finalized Procedure:            Upper GI endoscopy Indications:          Dysphagia, Stenosis of the GI tract Providers:            Thersia Petraglia B. Bonna Gains MD, MD Referring MD:         Elveria Rising. Damita Dunnings, MD (Referring MD) Medicines:            Monitored Anesthesia Care Complications:        No immediate complications. Procedure:            Pre-Anesthesia Assessment:                       - The risks and benefits of the procedure and the                        sedation options and risks were discussed with the                        patient. All questions were answered and informed                        consent was obtained.                       - Patient identification and proposed procedure were                        verified prior to the procedure.                       - ASA Grade Assessment: III - A patient with severe                        systemic disease.                       After obtaining informed consent, the endoscope was                        passed under direct vision. Throughout the procedure,                        the patient's blood pressure, pulse, and oxygen                        saturations were monitored continuously. The                        Colonoscope was introduced through the mouth, and                        advanced to the jejunum. The upper GI endoscopy was                        accomplished with ease. The patient tolerated the  procedure well. Findings:      One severe (stenosis; an endoscope cannot pass) stenosis was found 34 to       35 cm from the incisors. This stenosis measured 1 cm (in length). The       stenosis was traversed after dilation. A TTS dilator was  passed through       the scope. Dilation with a 02-21-11 mm balloon dilator was performed to       12 mm. The dilation site was examined and showed moderate improvement in       luminal narrowing. Good heme effect was noted. Dilation was first       performed to 78mm, and balloon dilator held at 10 mm for 1 minute, and       that did not allow for passage of endoscope. The dilation was then       performed to 12 mm which allowed for passage of endoscope. No further       dilation was performed as good heme effect was noted after dilation to       12 mm.      White nummular lesions were noted in the middle third of the esophagus.       Brushings for KOH prep were obtained in the middle third of the       esophagus.      There is no endoscopic evidence of mass in the entire esophagus.      The entire examined stomach was normal.      The examined jejunum was normal. Impression:           - Esophageal stenosis. Dilated.                       - White nummular lesions in esophageal mucosa.                        Brushings performed.                       - Normal stomach.                       - Normal examined jejunum. Recommendation:       - Discharge patient to home.                       - Soft diet.                       - Continue present medications.                       - Repeat upper endoscopy in 2 weeks for retreatment.                       - Return to GI clinic today.                       - Return to primary care physician as previously                        scheduled.                       - If stricture does not respond well to dilations,  referral for esophageal stenting vs surgery would be                        the next step and this was discussed with the patient                        and family.                       - Follow an antireflux regimen.                       - The findings and recommendations were discussed with                         the patient.                       - The findings and recommendations were discussed with                        the patient's family. Procedure Code(s):    --- Professional ---                       [phone], Esophagogastroduodenoscopy, flexible, transoral;                        with transendoscopic balloon dilation of esophagus                        (less than 30 mm diameter) Diagnosis Code(s):    --- Professional ---                       K22.2, Esophageal obstruction                       K22.8, Other specified diseases of esophagus                       R13.10, Dysphagia, unspecified                       K92.89, Other specified diseases of the digestive system CPT copyright 2017 American Medical Association. All rights reserved. The codes documented in this report are preliminary and upon coder review may  be revised to meet current compliance requirements.  [name], MD Margretta Sidle B. Bonna Gains MD, MD 10/03/2017 2:57:37 PM This report has been signed electronically. Number of Addenda: 0 Note Initiated On: 10/03/2017 2:26 PM      [facility]

## 2017-10-03 NOTE — H&P (Signed)
[name], MD [address] [place], [place], [place], [address] [address], [place], [place], [place], [address] Phone: [phone]  Fax: [phone]  Primary Care Physician:  Tonia Ghent, MD   Pre-Procedure History & Physical: HPI:  [name] is a 83 y.o. female is here for an EGD.   Past Medical History:  Diagnosis Date  . Arthritis   . Asthma   . COPD (chronic obstructive pulmonary disease) ([place])   . Dysphonia   . Dyspnea   . Esophageal cancer ([place])   . GERD (gastroesophageal reflux disease)   . Heart murmur   . History of kidney stones   . Hypertension   . Melanoma ([place])   . OAB (overactive bladder)   . OSA on CPAP   . 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 Personal history of chemotherapy    esophageal cancer  . Personal history of radiation therapy    esophageal cancer  . Pupil asymmetry    R pupil defect  . Skin cancer   . Sleep apnea   . Spinal cord abscess     Past Surgical History:  Procedure Laterality Date  . ABDOMINAL HYSTERECTOMY    . ANTERIOR CERVICAL DECOMP/DISCECTOMY FUSION N/A 07/16/2013   Procedure: ANTERIOR CERVICAL DECOMPRESSION/DISCECTOMY FUSION mutlipleLEVELS C4-7;  Surgeon: Erline Levine, MD;  Location: [facility]UR[facility]S;  Service: Neurosurgery;  Laterality: N/A;  . APPENDECTOMY    . carpel tunn Bilateral   . CATARACT EXTRACTION    . CATARACT EXTRACTION, BILATERAL    . CHOLECYSTECTOMY    . dental implant    . ESOPHAGOGASTRODUODENOSCOPY Left 03/21/2017   Procedure: ESOPHAGOGASTRODUODENOSCOPY (EGD);  Surgeon: Virgel Manifold, MD;  Location: [facility] ENDOSCOPY;  Service: Endoscopy;  Laterality: Left;  . ESOPHAGOGASTRODUODENOSCOPY (EGD) WITH PROPOFOL N/A 08/20/2017   Procedure: ESOPHAGOGASTRODUODENOSCOPY (EGD) WITH PROPOFOL;  Surgeon: Virgel Manifold, MD;  Location: [facility] ENDOSCOPY;  Service: Endoscopy;  Laterality: N/A;  . ESOPHAGOGASTRODUODENOSCOPY (EGD) WITH PROPOFOL N/A 09/19/2017   Procedure:  ESOPHAGOGASTRODUODENOSCOPY (EGD) WITH PROPOFOL;  Surgeon: Virgel Manifold, MD;  Location: [facility] ENDOSCOPY;  Service: Endoscopy;  Laterality: N/A;  . GASTRIC BYPASS    . HIATAL HERNIA REPAIR    . MELANOMA EXCISION    . PORTA CATH INSERTION N/A 04/07/2017   Procedure: PORTA CATH INSERTION;  Surgeon: Algernon Huxley, MD;  Location: [facility] CV LAB;  Service: Cardiovascular;  Laterality: N/A;  . POSTERIOR CERVICAL FUSION/FORAMINOTOMY N/A 07/28/2013   Procedure: Cervical four-seven  posterior cervical fusion;  Surgeon: Erline Levine, MD;  Location: [facility]URO ORS;  Service: Neurosurgery;  Laterality: N/A;  . TONSILLECTOMY    . TOTAL KNEE ARTHROPLASTY      Prior to Admission medications   Medication Sig Start Date End Date Taking? Authorizing Provider  acetaminophen (TYLENOL) 325 MG tablet Take 650 mg by mouth every 6 (six) hours as needed for moderate pain or headache.    Yes [provider]  calcium carbonate (TUMS EX) 750 MG chewable tablet Chew by mouth.   Yes [provider]  Cholecalciferol (VITAMIN D) 2000 UNITS CAPS Take 2,000 Units by mouth daily.   Yes [provider]  cyanocobalamin 500 MCG tablet Take 500 mcg every other day by mouth.   Yes [provider]  Diclofenac Sodium 1 % CREA Apply to left arm as needed for pain three times daily 04/23/17  Yes Earlie Server, MD  fluticasone [facility]) 50 MCG/ACT nasal spray Place 1-2 sprays into both  nostrils daily. 06/13/17  Yes Tonia Ghent, MD  furosemide (LASIX) 20 MG tablet Take 1 tablet (20 mg total) by mouth daily. 08/14/17  Yes Minna Merritts, MD  hydrocortisone cream 1 % Apply 1 application topically daily as needed for itching.    Yes [provider]  lidocaine-prilocaine (EMLA) cream Apply to affected area once 04/10/17  Yes Earlie Server, MD  loperamide (IMODIUM) 2 MG capsule Take 1 capsule (2 mg total) by mouth See admin instructions. With onset of loose stool, take 4mg  followed by 2mg  every 2  hours until 12 hours have passed without loose bowel movement. Maximum: 16 mg/day 04/14/17  Yes Earlie Server, MD  loratadine (CLARITIN) 10 MG tablet Take 10 mg by mouth daily.   Yes [provider]  LYRICA 150 MG capsule TAKE 1 CAPSULE BY MOUTH TWO TIMES DAILY 01/06/17  Yes Tonia Ghent, MD  metoprolol succinate (TOPROL-XL) 50 MG 24 hr tablet TAKE 1 TABLET BY MOUTH DAILY 01/14/17  Yes Tonia Ghent, MD  ondansetron (ZOFRAN) 8 MG tablet Take 1 tablet (8 mg total) by mouth 2 (two) times daily as needed for refractory nausea / vomiting. Start on day 3 after chemotherapy. 06/30/17  Yes Earlie Server, MD  oxybutynin (DITROPAN) 5 MG tablet TAKE 1/2 TABLET BY MOUTH IN THE MORNING AND 1 TABLET AT BEDTIME 03/27/17  Yes Tonia Ghent, MD  pantoprazole (PROTONIX) 40 MG tablet Take 1 tablet (40 mg total) by mouth 2 (two) times daily before a meal. 09/09/17 10/09/17 Yes Earlie Server, MD  Pediatric Multiple Vitamins (FLINTSTONES MULTIVITAMIN PO) Take by mouth.   Yes [provider]  Polyethyl Glycol-Propyl Glycol (SYSTANE OP) Apply 1 drop to eye daily as needed (dry eyes).   Yes [provider]  pyridOXINE (VITAMIN B-6) 100 MG tablet Take 1 tablet (100 mg total) by mouth daily. 04/28/17  Yes Earlie Server, MD  senna-docusate (SENOKOT-S) 8.6-50 MG tablet Take 1 tablet at bedtime as needed by mouth for mild constipation. 03/22/17  Yes Gouru, Aruna, MD  sucralfate (CARAFATE) 1 GM/10ML suspension Take 10 mLs (1 g total) by mouth 4 (four) times daily. 09/19/17  Yes Larenz Frasier, Lennette Bihari, MD  traMADol (ULTRAM) 50 MG tablet Take 1 tablet (50 mg total) by mouth 3 (three) times daily as needed for moderate pain. 04/16/17  Yes Earlie Server, MD  traZODone (DESYREL) 50 MG tablet TAKE 1/2 TABLET BY MOUTH AT BEDTIME IF NEEDED FOR SLEEP 04/17/17  Yes Tonia Ghent, MD  ALBUTEROL SULFATE HFA IN Inhale into the lungs.  07/07/15   [provider]  bisacodyl (DULCOLAX) 10 MG suppository Place rectally.    [provider]  potassium chloride (KLOR-CON) 20 MEQ packet Take 20 mEq by mouth as needed (Daily as needed with fluid pill). Patient not taking: Reported on 09/23/2017 08/14/17   Minna Merritts, MD  prochlorperazine (COMPAZINE) 10 MG tablet Take 1 tablet (10 mg total) by mouth every 6 (six) hours as needed (Nausea or vomiting). Patient not taking: Reported on 10/03/2017 06/30/17   Earlie Server, MD    Allergies as of 10/03/2017 - Review Complete 10/03/2017  Allergen Reaction Noted  . Cefuroxime Nausea Only 07/11/2015  . Codeine Nausea Only 01/22/2011  . Doxycycline Other (See Comments) 12/03/2013  . Gabapentin Other (See Comments) 12/19/2016  . Iohexol Itching 07/15/2013  . Other Other (See Comments) 12/03/2013  . Oxycodone Other (See Comments) 12/19/2016  . Quinolones Swelling 12/03/2013  . Synvisc [hylan g-f 20] Swelling 01/22/2011  Family History  Problem Relation Age of Onset  . Breast cancer Mother 54  . Arthritis-Osteo Mother   . Breast cancer Sister 23  . Bladder Cancer Sister   . Bladder Cancer Father   . Tongue cancer Father   . Congenital heart disease Father   . Prostate cancer Brother   . Uterine cancer Maternal Aunt   . Lung cancer Paternal Uncle   . Leukemia Maternal Grandmother   . Liver cancer Paternal Grandmother   . Colon cancer Neg Hx     Social History   Socioeconomic History  . Marital status: Widowed    Spouse name: Not on file  . Number of children: 2  . Years of education: Not on file  . Highest education level: Master's degree (e.g., MA, MS, MEng, MEd, MSW, MBA)  Occupational History  . Not on file  Social Needs  . Financial resource strain: Not hard at all  . Food insecurity:    Worry: Never true    Inability: Never true  . Transportation needs:    Medical: No    Non-medical: No  Tobacco Use  . Smoking status: Former Smoker    Packs/day: 1.00    Years: 20.00    Pack years: 20.00    Types: Cigarettes    Last attempt to quit: 1977     Years since quitting: 42.4  . Smokeless tobacco: Never Used  Substance and Sexual Activity  . Alcohol use: No  . Drug use: No  . Sexual activity: Never  Lifestyle  . Physical activity:    Days per week: 2 days    Minutes per session: 30 min  . Stress: Not at all  Relationships  . Social connections:    Talks on phone: More than three times a week    Gets together: More than three times a week    Attends religious service: More than 4 times per year    Active member of club or organization: Yes    Attends meetings of clubs or organizations: More than 4 times per year    Relationship status: Widowed  . Intimate partner violence:    Fear of current or ex partner: No    Emotionally abused: No    Physically abused: No    Forced sexual activity: No  Other Topics Concern  . Not on file  Social History Narrative   Marital Status- Widowed    Lives by herself    Employement- Retired Pharmacist, hospital   Exercise hx- Does PT     Review of Systems: See HPI, otherwise negative ROS  Physical Exam: BP 125/60   Pulse (!) 54   Temp 98.3 F (36.8 C) (Tympanic)   Resp 18   Ht 5' 2.5" (1.588 m)   Wt 217 lb (98.4 kg)   SpO2 96%   BMI 39.06 kg/m  General:   Alert,  pleasant and cooperative in NAD Head:  Normocephalic and atraumatic. Neck:  Supple; no masses or thyromegaly. Lungs:  Clear throughout to auscultation, normal respiratory effort.    Heart:  +S1, +S2, Regular rate and rhythm, No edema. Abdomen:  Soft, nontender and nondistended. Normal bowel sounds, without guarding, and without rebound.   Neurologic:  Alert and  oriented x4;  grossly normal neurologically.  Impression/Plan: Larry Sierras Shenker is here for an EGD for dysphagia, esophageal stricture, status post dilation exactly 2 weeks ago, with recurrent dysphagia.  EGD is to evaluate for further evaluation  Risks, benefits, limitations, and alternatives regarding  the procedure have been reviewed with the patient.  Questions have  been answered.  All parties agreeable.   Virgel Manifold, MD  10/03/2017, 1:59 PM

## 2017-10-03 NOTE — Transfer of Care (Signed)
Immediate Anesthesia Transfer of Care Note  Patient: [name]  Procedure(s) Performed: ESOPHAGOGASTRODUODENOSCOPY (EGD) WITH PROPOFOL (N/A )  Patient Location: Endoscopy Unit  Anesthesia Type:General  Level of Consciousness: drowsy and patient cooperative  Airway & Oxygen Therapy: Patient Spontanous Breathing and Patient connected to nasal cannula oxygen  Post-op Assessment: Report given to RN and Post -op Vital signs reviewed and stable  Post vital signs: Reviewed and stable  Last Vitals:  Vitals Value Taken Time  BP 162/76 10/03/2017  3:03 PM  Temp    Pulse 62 10/03/2017  3:11 PM  Resp 22 10/03/2017  3:11 PM  SpO2 99 % 10/03/2017  3:11 PM  Vitals shown include unvalidated device data.  Last Pain:  Vitals:   10/03/17 1502  TempSrc:   PainSc: 0-No pain         Complications: No apparent anesthesia complications

## 2017-10-03 NOTE — Anesthesia Post-op Follow-up Note (Signed)
Anesthesia QCDR form completed.        

## 2017-10-07 ENCOUNTER — Encounter: Payer: Self-pay | Admitting: Oncology

## 2017-10-07 ENCOUNTER — Inpatient Hospital Stay (HOSPITAL_BASED_OUTPATIENT_CLINIC_OR_DEPARTMENT_OTHER): Payer: Medicare Other | Admitting: Oncology

## 2017-10-07 ENCOUNTER — Telehealth: Payer: Self-pay | Admitting: Gastroenterology

## 2017-10-07 ENCOUNTER — Inpatient Hospital Stay: Payer: Medicare Other

## 2017-10-07 ENCOUNTER — Other Ambulatory Visit: Payer: Self-pay

## 2017-10-07 VITALS — BP 120/71 | HR 63 | Temp 97.0°F | Resp 18 | Ht 62.5 in | Wt 215.3 lb

## 2017-10-07 DIAGNOSIS — I10 Essential (primary) hypertension: Secondary | ICD-10-CM | POA: Diagnosis not present

## 2017-10-07 DIAGNOSIS — C155 Malignant neoplasm of lower third of esophagus: Secondary | ICD-10-CM

## 2017-10-07 DIAGNOSIS — Z923 Personal history of irradiation: Secondary | ICD-10-CM | POA: Diagnosis not present

## 2017-10-07 DIAGNOSIS — E876 Hypokalemia: Secondary | ICD-10-CM

## 2017-10-07 DIAGNOSIS — R1319 Other dysphagia: Secondary | ICD-10-CM

## 2017-10-07 DIAGNOSIS — Z87891 Personal history of nicotine dependence: Secondary | ICD-10-CM

## 2017-10-07 DIAGNOSIS — G629 Polyneuropathy, unspecified: Secondary | ICD-10-CM

## 2017-10-07 DIAGNOSIS — R1310 Dysphagia, unspecified: Secondary | ICD-10-CM

## 2017-10-07 DIAGNOSIS — K222 Esophageal obstruction: Secondary | ICD-10-CM

## 2017-10-07 DIAGNOSIS — J449 Chronic obstructive pulmonary disease, unspecified: Secondary | ICD-10-CM

## 2017-10-07 DIAGNOSIS — K219 Gastro-esophageal reflux disease without esophagitis: Secondary | ICD-10-CM | POA: Diagnosis not present

## 2017-10-07 DIAGNOSIS — D509 Iron deficiency anemia, unspecified: Secondary | ICD-10-CM | POA: Diagnosis not present

## 2017-10-07 DIAGNOSIS — C787 Secondary malignant neoplasm of liver and intrahepatic bile duct: Secondary | ICD-10-CM

## 2017-10-07 DIAGNOSIS — Z800 Family history of malignant neoplasm of digestive organs: Secondary | ICD-10-CM

## 2017-10-07 DIAGNOSIS — Z85820 Personal history of malignant melanoma of skin: Secondary | ICD-10-CM

## 2017-10-07 DIAGNOSIS — Z79899 Other long term (current) drug therapy: Secondary | ICD-10-CM

## 2017-10-07 DIAGNOSIS — Z8042 Family history of malignant neoplasm of prostate: Secondary | ICD-10-CM

## 2017-10-07 DIAGNOSIS — Z808 Family history of malignant neoplasm of other organs or systems: Secondary | ICD-10-CM

## 2017-10-07 DIAGNOSIS — M1990 Unspecified osteoarthritis, unspecified site: Secondary | ICD-10-CM

## 2017-10-07 DIAGNOSIS — R5381 Other malaise: Secondary | ICD-10-CM

## 2017-10-07 DIAGNOSIS — R5383 Other fatigue: Secondary | ICD-10-CM | POA: Diagnosis not present

## 2017-10-07 DIAGNOSIS — Z803 Family history of malignant neoplasm of breast: Secondary | ICD-10-CM

## 2017-10-07 DIAGNOSIS — Z5111 Encounter for antineoplastic chemotherapy: Secondary | ICD-10-CM

## 2017-10-07 DIAGNOSIS — Z8052 Family history of malignant neoplasm of bladder: Secondary | ICD-10-CM

## 2017-10-07 DIAGNOSIS — Z806 Family history of leukemia: Secondary | ICD-10-CM

## 2017-10-07 HISTORY — DX: Hypokalemia: E87.6

## 2017-10-07 LAB — CBC WITH DIFFERENTIAL/PLATELET
Basophils Absolute: 0 10*3/uL (ref 0–0.1)
Basophils Relative: 1 %
EOS ABS: 0.2 10*3/uL (ref 0–0.7)
Eosinophils Relative: 6 %
HCT: 36 % (ref 35.0–47.0)
HEMOGLOBIN: 12.5 g/dL (ref 12.0–16.0)
LYMPHS ABS: 0.5 10*3/uL — AB (ref 1.0–3.6)
Lymphocytes Relative: 14 %
MCH: 34.4 pg — ABNORMAL HIGH (ref 26.0–34.0)
MCHC: 34.6 g/dL (ref 32.0–36.0)
MCV: 99.4 fL (ref 80.0–100.0)
MONO ABS: 0.6 10*3/uL (ref 0.2–0.9)
MONOS PCT: 16 %
NEUTROS PCT: 63 %
Neutro Abs: 2.3 10*3/uL (ref 1.4–6.5)
Platelets: 146 10*3/uL — ABNORMAL LOW (ref 150–440)
RBC: 3.63 MIL/uL — ABNORMAL LOW (ref 3.80–5.20)
RDW: 15.4 % — ABNORMAL HIGH (ref 11.5–14.5)
WBC: 3.6 10*3/uL (ref 3.6–11.0)

## 2017-10-07 LAB — COMPREHENSIVE METABOLIC PANEL
ALBUMIN: 3.7 g/dL (ref 3.5–5.0)
ALT: 17 U/L (ref 14–54)
AST: 39 U/L (ref 15–41)
Alkaline Phosphatase: 76 U/L (ref 38–126)
Anion gap: 12 (ref 5–15)
BUN: 15 mg/dL (ref 6–20)
CO2: 27 mmol/L (ref 22–32)
CREATININE: 0.86 mg/dL (ref 0.44–1.00)
Calcium: 9.3 mg/dL (ref 8.9–10.3)
Chloride: 103 mmol/L (ref 101–111)
GFR calc non Af Amer: >60 mL/min (ref >60)
GLUCOSE: 110 mg/dL — AB (ref 65–99)
Potassium: 3.3 mmol/L — ABNORMAL LOW (ref 3.5–5.1)
SODIUM: 142 mmol/L (ref 135–145)
Total Bilirubin: 0.5 mg/dL (ref 0.3–1.2)
Total Protein: 6.6 g/dL (ref 6.5–8.1)

## 2017-10-07 MED ORDER — FLUOROURACIL CHEMO INJECTION 2.5 GM/50ML
400.0000 mg/m2 | Freq: Once | INTRAVENOUS | Status: AC
  Administered 2017-10-07: 850 mg via INTRAVENOUS
  Filled 2017-10-07: qty 17

## 2017-10-07 MED ORDER — SODIUM CHLORIDE 0.9% FLUSH
10.0000 mL | INTRAVENOUS | Status: DC | PRN
  Administered 2017-10-07: 10 mL via INTRAVENOUS
  Filled 2017-10-07: qty 10

## 2017-10-07 MED ORDER — HEPARIN SOD (PORK) LOCK FLUSH 100 UNIT/ML IV SOLN: 500.0000 [IU] | Freq: Once | INTRAVENOUS | Status: DC

## 2017-10-07 MED ORDER — POTASSIUM CHLORIDE 20 MEQ/100ML IV SOLN
20.0000 meq | Freq: Once | INTRAVENOUS | Status: AC
  Administered 2017-10-07: 20 meq via INTRAVENOUS

## 2017-10-07 MED ORDER — LEUCOVORIN CALCIUM INJECTION 100 MG
20.0000 mg/m2 | Freq: Once | INTRAMUSCULAR | Status: AC
  Administered 2017-10-07: 44 mg via INTRAVENOUS
  Filled 2017-10-07: qty 2.2

## 2017-10-07 MED ORDER — SODIUM CHLORIDE 0.9 % IV SOLN
2400.0000 mg/m2 | INTRAVENOUS | Status: DC
  Administered 2017-10-07: 5200 mg via INTRAVENOUS
  Filled 2017-10-07: qty 100

## 2017-10-07 NOTE — Telephone Encounter (Signed)
Patient called and needs to schedule an EGD for 2 weeks but not on Friday June 7th due to a graduation. Please call patient

## 2017-10-07 NOTE — Telephone Encounter (Signed)
Left message than 6/12 should be fine for her EGD and to contact office.  I had spoke with pt earlier and she is okay with the 6/12 or if needed could do 6/6/ or 6/11 ([place]) also. I spoke with Dr Bonna Gains and 6/12 is fine. Procedure not to be any more frequent than 2 wks and prefers to do at the hospital instead of [place] outpatient surgery.

## 2017-10-07 NOTE — Telephone Encounter (Signed)
Left message for pt to contact office and that I will schedule EGD for 10/22/17 at [facility]. If necessary will reschedule pt.

## 2017-10-07 NOTE — Progress Notes (Signed)
No new changes noted today 

## 2017-10-07 NOTE — Progress Notes (Signed)
Hematology/Oncology Follow Up Note [facility]  Telephone:([phone] [phone]  Patient Care Team: Tonia Ghent, MD as PCP - General (Family Medicine)   Name of the patient: [name]  [id number]  1935-01-31   REASON FOR VISIT Follow up of management of esophageal adenocarcinoma, assessment for chemotherapy.   HISTORY OF PRESENT ILLNESS Patient is a 83 year old female who has metastatic esophageal cancer with liver involvement.  Weight loss 20 pounds for the past year prior to diagnosis.  # EGD during admission showed partially obstructive esophageal mass at the distal part of esophagus. Biopsy was taken. Pathology showed esophageal adenocarcinoma. #US biopsy of liver lesion to proof distant metastasis.  Report family history of breast cancer, but she has been having routine mammograms.  # Lives alone. She's a former smoker.  # Molecular Testing:  #[employer] Cdx: No reportable alternations with companion diagnostic (CDx) claims.  MS stable Tumor mutation burden: Cannot be determined, CCND3 amplification: Equivocal.  Amended reports on 06/04/2017, Tumor mutational burden changed from can not be determined to "TMB -low" on the re-analyzed samples.  # HER 2 negative.   Current Treatment S/p 6 cycles of FOLFOX, PET scan showed excellent partial response from both chemotherapy and radiation, result was discussed with patient.  currently on 5-FU maintenance.  S/p palliative radiation in January 2019 # Developed esophageal stricture secondary to radiation in April and got dilation via endoscopy. EGD shoed Benign-appearing esophageal stenosis. This is a result of radiation therapy.- LA Grade C radiation esophagitis. - The previously noted esophageal mass in Nov 2018 was much decreased indicating good response to radiation/chemotherapy. - Normal stomach.- Normal examined jejunum.- No specimens collected.    INTERVAL HISTORY  Patient presents  for evaluation prior to Cycle 7  Maintenance 5-FU chemotherapy for esophageal adenocarcinoma.  During the interval she has recurrent dysphagia and had another EGD dilation by  Dr. Bonna Gains.  She takes protonix 44m BID. Feels well, mild fatigue. Denies diarrhea, nausea, vomiting. Denies dysphagia.  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     Review of systems Review of Systems  Constitutional: Positive for malaise/fatigue. Negative for chills, diaphoresis, fever and weight loss.  HENT: Negative for congestion, ear discharge, ear pain, hearing loss, nosebleeds, sinus pain and sore throat.   Eyes: Negative for double vision, photophobia, pain, discharge and redness.  Respiratory: Negative for cough, hemoptysis, sputum production, shortness of breath and wheezing.   Cardiovascular: Negative for chest pain, palpitations, orthopnea, claudication and leg swelling.  Gastrointestinal: Negative for abdominal pain, blood in stool, constipation, diarrhea, heartburn, melena, nausea and vomiting.  Genitourinary: Negative for dysuria, flank pain, frequency, hematuria and urgency.  Musculoskeletal: Negative for back pain, joint pain, myalgias and neck pain.  Skin: Negative for itching and rash.  Neurological: Positive for tingling and sensory change. Negative for dizziness, tremors, focal weakness, weakness and headaches.  Endo/Heme/Allergies: Negative for environmental allergies. Does not bruise/bleed easily.  Psychiatric/Behavioral: Negative for depression, hallucinations, substance abuse and suicidal ideas. The patient is not nervous/anxious.     Allergies  Allergen Reactions  . Cefuroxime Nausea Only  . Codeine Nausea Only  . Doxycycline Other (See Comments)    Lip swelling  . Gabapentin Other (See Comments)    Swelling.   . Iohexol Itching    07-15-13 pt developed itching on fingers after contrast given. Dr. OIrish Elderslooked at pt and said to put in  system as allergy. BB  . Other Other (See  Comments)    Contrast Dye- caused fever, chills, awful feeling Bandages - itching, rash  . Oxycodone Other (See Comments)    nausea  . Quinolones Swelling    Lip swelling  . Synvisc [Hylan G-F 20] Swelling     Past Medical History:  Diagnosis Date  . Arthritis   . Asthma   . COPD (chronic obstructive pulmonary disease) ([place])   . Dysphonia   . Dyspnea   . Esophageal cancer ([place])   . GERD (gastroesophageal reflux disease)   . Heart murmur   . History of kidney stones   . Hypertension   . Melanoma ([place])   . OAB (overactive bladder)   . OSA on CPAP   . 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 Personal history of chemotherapy    esophageal cancer  . Personal history of radiation therapy    esophageal cancer  . Pupil asymmetry    R pupil defect  . Skin cancer   . Sleep apnea   . Spinal cord abscess      Past Surgical History:  Procedure Laterality Date  . ABDOMINAL HYSTERECTOMY    . ANTERIOR CERVICAL DECOMP/DISCECTOMY FUSION N/A 07/16/2013   Procedure: ANTERIOR CERVICAL DECOMPRESSION/DISCECTOMY FUSION mutlipleLEVELS C4-7;  Surgeon: Erline Levine, MD;  Location: [place] NEURO ORS;  Service: Neurosurgery;  Laterality: N/A;  . APPENDECTOMY    . carpel tunn Bilateral   . CATARACT EXTRACTION    . CATARACT EXTRACTION, BILATERAL    . CHOLECYSTECTOMY    . dental implant    . ESOPHAGOGASTRODUODENOSCOPY Left 03/21/2017   Procedure: ESOPHAGOGASTRODUODENOSCOPY (EGD);  Surgeon: Virgel Manifold, MD;  [facility] END[facility]COPY;  Service: Endoscopy;  Laterality: Left;  . ESOPHAGOGASTRODUODENOSCOPY (EGD) WITH PROPOFOL N/A 08/20/2017   Procedure: ESOPHAGOGASTRODUODENOSCOPY (EGD) WITH PROPOFOL;  Surgeon: Virgel Manifold, MD;  Location: [facility] ENDOSCOPY;  Service: Endoscopy;  Laterality: N/A;  . ESOPHAGOGASTRODUODENOSCOPY (EGD) WITH PROPOFOL N/A 09/19/2017   Procedure: ESOPHAGOGASTRODUODENOSCOPY (EGD) WITH PROPOFOL;  Surgeon:  Virgel Manifold, MD;  Location: [facility] ENDOSCOPY;  Service: Endoscopy;  Laterality: N/A;  . GASTRIC BYPASS    . HIATAL HERNIA REPAIR    . MELANOMA EXCISION    . PORTA CATH INSERTION N/A 04/07/2017   Procedure: PORTA CATH INSERTION;  Surgeon: Algernon Huxley, MD;  Location: [place] CV LAB;  Service: Cardiovascular;  Laterality: N/A;  . POSTERIOR CERVICAL FUSION/FORAMINOTOMY N/A 07/28/2013   Procedure: Cervical four-seven  posterior cervical fusion;  Surgeon: Erline Levine, MD;  Location: [place] NEURO ORS;  Service: Neurosurgery;  Laterality: N/A;  . TONSILLECTOMY    . TOTAL KNEE ARTHROPLASTY      Social History   Socioeconomic History  . Marital status: Widowed    Spouse name: Not on file  . Number of children: 2  . Years of education: Not on file  . Highest education level: Master's degree (e.g., MA, MS, MEng, MEd, MSW, MBA)  Occupational History  . Not on file  Social Needs  . Financial resource strain: Not hard at all  . Food insecurity:    Worry: Never true    Inability: Never true  . Transportation needs:    Medical: No    Non-medical: No  Tobacco Use  . Smoking status: Former Smoker    Packs/day: 1.00    Years: 20.00    Pack years: 20.00    Types: Cigarettes    Last attempt to quit: 1977    Years since quitting: 42.4  . Smokeless tobacco:  Never Used  Substance and Sexual Activity  . Alcohol use: No  . Drug use: No  . Sexual activity: Never  Lifestyle  . Physical activity:    Days per week: 2 days    Minutes per session: 30 min  . Stress: Not at all  Relationships  . Social connections:    Talks on phone: More than three times a week    Gets together: More than three times a week    Attends religious service: More than 4 times per year    Active member of club or organization: Yes    Attends meetings of clubs or organizations: More than 4 times per year    Relationship status: Widowed  . Intimate partner violence:    Fear of current or ex partner: No     Emotionally abused: No    Physically abused: No    Forced sexual activity: No  Other Topics Concern  . Not on file  Social History Narrative   Marital Status- Widowed    Lives by herself    Employement- Retired Pharmacist, hospital   Exercise hx- Does PT     Family History  Problem Relation Age of Onset  . Breast cancer Mother 46  . Arthritis-Osteo Mother   . Breast cancer Sister 50  . Bladder Cancer Sister   . Bladder Cancer Father   . Tongue cancer Father   . Congenital heart disease Father   . Prostate cancer Brother   . Uterine cancer Maternal Aunt   . Lung cancer Paternal Uncle   . Leukemia Maternal Grandmother   . Liver cancer Paternal Grandmother   . Colon cancer Neg Hx      Current Outpatient Medications:  .  acetaminophen (TYLENOL) 325 MG tablet, Take 650 mg by mouth every 6 (six) hours as needed for moderate pain or headache. , Disp: , Rfl:  .  ALBUTEROL SULFATE HFA IN, Inhale into the lungs. , Disp: , Rfl:  .  bisacodyl (DULCOLAX) 10 MG suppository, Place rectally., Disp: , Rfl:  .  calcium carbonate (TUMS EX) 750 MG chewable tablet, Chew by mouth., Disp: , Rfl:  .  Cholecalciferol (VITAMIN D) 2000 UNITS CAPS, Take 2,000 Units by mouth daily., Disp: , Rfl:  .  cyanocobalamin 500 MCG tablet, Take 500 mcg every other day by mouth., Disp: , Rfl:  .  Diclofenac Sodium 1 % CREA, Apply to left arm as needed for pain three times daily, Disp: 120 g, Rfl: 0 .  fluticasone (FLONASE) 50 MCG/ACT nasal spray, Place 1-2 sprays into both nostrils daily., Disp: 16 g, Rfl: 5 .  furosemide (LASIX) 20 MG tablet, Take 1 tablet (20 mg total) by mouth daily., Disp: 90 tablet, Rfl: 3 .  hydrocortisone cream 1 %, Apply 1 application topically daily as needed for itching. , Disp: , Rfl:  .  lidocaine-prilocaine (EMLA) cream, Apply to affected area once, Disp: 30 g, Rfl: 3 .  loperamide (IMODIUM) 2 MG capsule, Take 1 capsule (2 mg total) by mouth See admin instructions. With onset of loose stool, take  19m followed by 225mevery 2 hours until 12 hours have passed without loose bowel movement. Maximum: 16 mg/day, Disp: 120 capsule, Rfl: 0 .  loratadine (CLARITIN) 10 MG tablet, Take 10 mg by mouth daily., Disp: , Rfl:  .  LYRICA 150 MG capsule, TAKE 1 CAPSULE BY MOUTH TWO TIMES DAILY, Disp: 180 capsule, Rfl: 1 .  metoprolol succinate (TOPROL-XL) 50 MG 24 hr tablet, TAKE 1  TABLET BY MOUTH DAILY, Disp: 90 tablet, Rfl: 3 .  ondansetron (ZOFRAN) 8 MG tablet, Take 1 tablet (8 mg total) by mouth 2 (two) times daily as needed for refractory nausea / vomiting. Start on day 3 after chemotherapy., Disp: 30 tablet, Rfl: 1 .  oxybutynin (DITROPAN) 5 MG tablet, TAKE 1/2 TABLET BY MOUTH IN THE MORNING AND 1 TABLET AT BEDTIME, Disp: , Rfl:  .  pantoprazole (PROTONIX) 40 MG tablet, Take 1 tablet (40 mg total) by mouth 2 (two) times daily before a meal., Disp: 60 tablet, Rfl: 1 .  Pediatric Multiple Vitamins (FLINTSTONES MULTIVITAMIN PO), Take by mouth., Disp: , Rfl:  .  Polyethyl Glycol-Propyl Glycol (SYSTANE OP), Apply 1 drop to eye daily as needed (dry eyes)., Disp: , Rfl:  .  potassium chloride (KLOR-CON) 20 MEQ packet, Take 20 mEq by mouth as needed (Daily as needed with fluid pill). (Patient not taking: Reported on 09/23/2017), Disp: 30 packet, Rfl: 1 .  prochlorperazine (COMPAZINE) 10 MG tablet, Take 1 tablet (10 mg total) by mouth every 6 (six) hours as needed (Nausea or vomiting). (Patient not taking: Reported on 10/03/2017), Disp: 30 tablet, Rfl: 1 .  pyridOXINE (VITAMIN B-6) 100 MG tablet, Take 1 tablet (100 mg total) by mouth daily., Disp: 30 tablet, Rfl: 3 .  senna-docusate (SENOKOT-S) 8.6-50 MG tablet, Take 1 tablet at bedtime as needed by mouth for mild constipation., Disp: , Rfl:  .  sucralfate (CARAFATE) 1 GM/10ML suspension, Take 10 mLs (1 g total) by mouth 4 (four) times daily., Disp: 420 mL, Rfl: 1 .  traMADol (ULTRAM) 50 MG tablet, Take 1 tablet (50 mg total) by mouth 3 (three) times daily as needed  for moderate pain., Disp: 30 tablet, Rfl: 1 .  traZODone (DESYREL) 50 MG tablet, TAKE 1/2 TABLET BY MOUTH AT BEDTIME IF NEEDED FOR SLEEP, Disp: 45 tablet, Rfl: 3  Physical exam:  Vitals:   10/07/17 0851  BP: 120/71  Pulse: 63  Resp: 18  Temp: (!) 97 F (36.1 C)  TempSrc: Tympanic  SpO2: 93%  Weight: 215 lb 4.8 oz (97.7 kg)  Height: 5' 2.5" (1.588 m)  ECOG 1 Physical Exam  Constitutional: She is oriented to person, place, and time and well-developed, well-nourished, and in no distress. No distress.  HENT:  Head: Normocephalic and atraumatic.  Nose: Nose normal.  Mouth/Throat: Oropharynx is clear and moist. No oropharyngeal exudate.  Eyes: Pupils are equal, round, and reactive to light. Conjunctivae and EOM are normal. Left eye exhibits no discharge. No scleral icterus.  Neck: Normal range of motion. Neck supple. No JVD present.  Cardiovascular: Normal rate and regular rhythm.  Murmur heard. Pulmonary/Chest: Effort normal and breath sounds normal. No respiratory distress. She has no wheezes. She has no rales. She exhibits no tenderness.  Abdominal: Soft. Bowel sounds are normal. She exhibits no distension and no mass. There is no tenderness. There is no rebound and no guarding.  Musculoskeletal: Normal range of motion. She exhibits no edema, tenderness or deformity.  Lymphadenopathy:    She has no cervical adenopathy.  Neurological: She is alert and oriented to person, place, and time. No cranial nerve deficit. She exhibits normal muscle tone. Coordination normal.  Skin: Skin is warm and dry. No rash noted. She is not diaphoretic. No erythema.  Psychiatric: Affect and judgment normal.    CMP Latest Ref Rng & Units 09/23/2017  Glucose 65 - 99 mg/dL 85  BUN 6 - 20 mg/dL 13  Creatinine 0.44 - 1.00  mg/dL 0.77  Sodium 135 - 145 mmol/L 142  Potassium 3.5 - 5.1 mmol/L 3.3(L)  Chloride 101 - 111 mmol/L 105  CO2 22 - 32 mmol/L 27  Calcium 8.9 - 10.3 mg/dL 8.8(L)  Total Protein 6.5 -  8.1 g/dL 6.2(L)  Total Bilirubin 0.3 - 1.2 mg/dL 0.8  Alkaline Phos 38 - 126 U/L 68  AST 15 - 41 U/L 37  ALT 14 - 54 U/L 17   CBC Latest Ref Rng & Units 09/23/2017  WBC 3.6 - 11.0 K/uL 3.0(L)  Hemoglobin 12.0 - 16.0 g/dL 11.9(L)  Hematocrit 35.0 - 47.0 % 33.9(L)  Platelets 150 - 440 K/uL 127(L)    Assessment and plan  Cancer Staging Malignant neoplasm of lower third of esophagus (HCC) Staging form: Esophagus - Adenocarcinoma, AJCC 8th Edition - Clinical stage from 04/10/2017: Stage IVB (cTX, cNX, pM1) - Signed by Earlie Server, MD on 04/10/2017  1. Malignant neoplasm of lower third of esophagus (HCC)   2. Encounter for antineoplastic chemotherapy   3. Esophageal stricture   4. Hypokalemia    # Proceed with cycle 7 maintenance.5 FU bolus and 48-hour infusion. She tolerates chemotherapy well.  Esophagus cancer stable CEA.  Plan PET scan prior to next treatment.   # Esophageal Stricture, s/ dilation via endoscopy. Continue protonix 2m BID, follow up with GI for repeat procedure if needed.   # Iron deficiency anemia:monitor iron level periodically.Iron panel reviewed, ferritin level has gradually decreased. Plan anther IV Venofer infusion after she finish next chemotherapy treatment.  # Hypokalemia: due to lasix use. will give IV 270m KCL today. Advise patient to take Potassium supplements as instructed by her cardiologist.  # Pre-existing peripheral neuropathy: stable She continues Lyrica and B6 supplementation  Follow up in 2 week for assessment prior to next cycle of 5-FU maintenance treatment.  Patient knows to call if any concerns or questions.   ZhEarlie ServerMD, PhD Hematology Oncology CoGrand Valley Surgical Centert [facility] Campus[facility]- [phone]/28/19

## 2017-10-08 ENCOUNTER — Telehealth: Payer: Self-pay | Admitting: Gastroenterology

## 2017-10-08 ENCOUNTER — Telehealth: Payer: Self-pay

## 2017-10-08 ENCOUNTER — Other Ambulatory Visit: Payer: Self-pay | Admitting: Oncology

## 2017-10-08 ENCOUNTER — Other Ambulatory Visit: Payer: Self-pay | Admitting: Family Medicine

## 2017-10-08 NOTE — Telephone Encounter (Signed)
-----   Message from Virgel Manifold, MD sent at 10/03/2017  2:57 PM EDT -----  Please set up EGD for dysphagia in 2-3 weeks from 10/03/17. Set up clinic appointment in 4-6 weeks if she does not have one. Thank you.

## 2017-10-08 NOTE — Telephone Encounter (Signed)
Pt states that this chemo treatment does not bother her at all. The infusion backpack she can tuck under her right arm and that she does not have any problem with having the EGD on 6/12 if the doctor and hospital does not.  I informed pt that I would let Dr. Bonna Gains know this and get back with her.

## 2017-10-08 NOTE — Telephone Encounter (Signed)
Pt left vm she is  Scheduled for procedure 06/12 she is also going to have chemo therapy 06/11 and will be wearing an infusion backpack for 3 days which will be during procedure she is asking if that will be a problem please call pt

## 2017-10-08 NOTE — Telephone Encounter (Signed)
See note from 10/08/17 for additional information.

## 2017-10-08 NOTE — Telephone Encounter (Signed)
See message of 10/07/17.

## 2017-10-09 ENCOUNTER — Inpatient Hospital Stay: Payer: Medicare Other

## 2017-10-09 ENCOUNTER — Other Ambulatory Visit: Payer: Self-pay | Admitting: Gastroenterology

## 2017-10-09 VITALS — BP 126/78 | HR 61 | Temp 98.0°F | Resp 18

## 2017-10-09 DIAGNOSIS — C155 Malignant neoplasm of lower third of esophagus: Secondary | ICD-10-CM

## 2017-10-09 MED ORDER — HEPARIN SOD (PORK) LOCK FLUSH 100 UNIT/ML IV SOLN
500.0000 [IU] | Freq: Once | INTRAVENOUS | Status: AC | PRN
  Administered 2017-10-09: 500 [IU]
  Filled 2017-10-09: qty 5

## 2017-10-09 MED ORDER — SODIUM CHLORIDE 0.9% FLUSH
10.0000 mL | INTRAVENOUS | Status: DC | PRN
  Administered 2017-10-09: 10 mL
  Filled 2017-10-09: qty 10

## 2017-10-09 MED ORDER — FLUCONAZOLE 200 MG PO TABS: ORAL_TABLET | ORAL | 0 refills | Status: AC

## 2017-10-10 NOTE — Telephone Encounter (Signed)
I spoke with Endo, who asked Dr. Amie Critchley (Anesthesiologist) regarding pt receiving chemo and procedure EGD on 10/22/17 and he does not have any problems with it. They will start a peripheral IV. If there is a different anesthesiologist that day, they may require chemo be paused for 1-2 hrs.  I I have notified pt and she will contact Dr. Tasia Catchings, MD, oncologist to see if this would be okay and call me back.

## 2017-10-13 ENCOUNTER — Telehealth: Payer: Self-pay | Admitting: *Deleted

## 2017-10-13 ENCOUNTER — Telehealth: Payer: Self-pay | Admitting: Gastroenterology

## 2017-10-13 ENCOUNTER — Encounter: Payer: Self-pay | Admitting: Gastroenterology

## 2017-10-13 NOTE — Telephone Encounter (Signed)
Should be fine for me. As long as GI is ok. Thanks.

## 2017-10-13 NOTE — Telephone Encounter (Signed)
Pt left vm she was told to get in touch with  Dr. Jeani Sow For approval of her pack pack [place] to see if she could cut it off for procedure   And  She has not  Been able to get in touch with  Dr. Jeani Sow

## 2017-10-13 NOTE — Telephone Encounter (Signed)
Patient [name] that Dr. Tasia Catchings said it was ok to do the EGD while with her chemo pack. Any questions, please call

## 2017-10-13 NOTE — Telephone Encounter (Signed)
I again attempted to reach patient and reached her. I told her per Dr Tasia Catchings OK to keep pump going and she states she will contact GI office

## 2017-10-13 NOTE — Telephone Encounter (Signed)
Attempted to contact patient to inform of physician response, got no answer

## 2017-10-13 NOTE — Telephone Encounter (Signed)
Patient called and is inquiring if she can have her endoscopy with her chemotherapy pump on. She is scheduled for Tuesday 10/22/17. Please advise of what she is to do about her pump

## 2017-10-15 ENCOUNTER — Ambulatory Visit
Admission: RE | Admit: 2017-10-15 | Discharge: 2017-10-15 | Disposition: A | Discharge status: Home / Self Care | Payer: Medicare Other | Source: Ambulatory Visit | Attending: Oncology | Admitting: Oncology

## 2017-10-15 DIAGNOSIS — I2510 Atherosclerotic heart disease of native coronary artery without angina pectoris: Secondary | ICD-10-CM | POA: Diagnosis not present

## 2017-10-15 DIAGNOSIS — N200 Calculus of kidney: Secondary | ICD-10-CM | POA: Insufficient documentation

## 2017-10-15 DIAGNOSIS — I700 Atherosclerosis of aorta: Secondary | ICD-10-CM | POA: Diagnosis not present

## 2017-10-15 DIAGNOSIS — Z5111 Encounter for antineoplastic chemotherapy: Secondary | ICD-10-CM | POA: Diagnosis present

## 2017-10-15 DIAGNOSIS — K222 Esophageal obstruction: Secondary | ICD-10-CM | POA: Diagnosis present

## 2017-10-15 DIAGNOSIS — C155 Malignant neoplasm of lower third of esophagus: Secondary | ICD-10-CM | POA: Diagnosis present

## 2017-10-15 LAB — GLUCOSE, CAPILLARY: GLUCOSE-CAPILLARY: 100 mg/dL — AB (ref 65–99)

## 2017-10-15 MED ORDER — FLUDEOXYGLUCOSE F - 18 (FDG) INJECTION
11.1000 | Freq: Once | INTRAVENOUS | Status: AC | PRN
  Administered 2017-10-15: 11.38 via INTRAVENOUS

## 2017-10-21 ENCOUNTER — Inpatient Hospital Stay: Payer: Medicare Other | Attending: Oncology

## 2017-10-21 ENCOUNTER — Other Ambulatory Visit: Payer: Self-pay

## 2017-10-21 ENCOUNTER — Encounter: Payer: Self-pay | Admitting: Oncology

## 2017-10-21 ENCOUNTER — Inpatient Hospital Stay (HOSPITAL_BASED_OUTPATIENT_CLINIC_OR_DEPARTMENT_OTHER): Payer: Medicare Other | Admitting: Oncology

## 2017-10-21 ENCOUNTER — Encounter: Payer: Self-pay | Admitting: *Deleted

## 2017-10-21 ENCOUNTER — Inpatient Hospital Stay: Payer: Medicare Other

## 2017-10-21 VITALS — BP 140/76 | HR 64 | Temp 97.5°F | Resp 18 | Wt 210.2 lb

## 2017-10-21 DIAGNOSIS — Z5111 Encounter for antineoplastic chemotherapy: Secondary | ICD-10-CM

## 2017-10-21 DIAGNOSIS — C787 Secondary malignant neoplasm of liver and intrahepatic bile duct: Secondary | ICD-10-CM | POA: Insufficient documentation

## 2017-10-21 DIAGNOSIS — Z803 Family history of malignant neoplasm of breast: Secondary | ICD-10-CM

## 2017-10-21 DIAGNOSIS — K222 Esophageal obstruction: Secondary | ICD-10-CM | POA: Diagnosis not present

## 2017-10-21 DIAGNOSIS — R5383 Other fatigue: Secondary | ICD-10-CM

## 2017-10-21 DIAGNOSIS — Z87891 Personal history of nicotine dependence: Secondary | ICD-10-CM | POA: Insufficient documentation

## 2017-10-21 DIAGNOSIS — R634 Abnormal weight loss: Secondary | ICD-10-CM | POA: Diagnosis not present

## 2017-10-21 DIAGNOSIS — Z79899 Other long term (current) drug therapy: Secondary | ICD-10-CM | POA: Insufficient documentation

## 2017-10-21 DIAGNOSIS — C155 Malignant neoplasm of lower third of esophagus: Secondary | ICD-10-CM | POA: Insufficient documentation

## 2017-10-21 DIAGNOSIS — R1319 Other dysphagia: Secondary | ICD-10-CM | POA: Diagnosis not present

## 2017-10-21 DIAGNOSIS — Z923 Personal history of irradiation: Secondary | ICD-10-CM

## 2017-10-21 DIAGNOSIS — D696 Thrombocytopenia, unspecified: Secondary | ICD-10-CM

## 2017-10-21 DIAGNOSIS — D500 Iron deficiency anemia secondary to blood loss (chronic): Secondary | ICD-10-CM | POA: Diagnosis not present

## 2017-10-21 DIAGNOSIS — G629 Polyneuropathy, unspecified: Secondary | ICD-10-CM

## 2017-10-21 DIAGNOSIS — R5381 Other malaise: Secondary | ICD-10-CM

## 2017-10-21 LAB — CBC WITH DIFFERENTIAL/PLATELET
BASOS ABS: 0 10*3/uL (ref 0–0.1)
BASOS PCT: 1 %
EOS ABS: 0.3 10*3/uL (ref 0–0.7)
EOS PCT: 7 %
HCT: 37.7 % (ref 35.0–47.0)
HEMOGLOBIN: 13 g/dL (ref 12.0–16.0)
Lymphocytes Relative: 14 %
Lymphs Abs: 0.5 10*3/uL — ABNORMAL LOW (ref 1.0–3.6)
MCH: 34 pg (ref 26.0–34.0)
MCHC: 34.4 g/dL (ref 32.0–36.0)
MCV: 98.7 fL (ref 80.0–100.0)
Monocytes Absolute: 0.5 10*3/uL (ref 0.2–0.9)
Monocytes Relative: 15 %
NEUTROS PCT: 63 %
Neutro Abs: 2.3 10*3/uL (ref 1.4–6.5)
PLATELETS: 140 10*3/uL — AB (ref 150–440)
RBC: 3.82 MIL/uL (ref 3.80–5.20)
RDW: 15.2 % — ABNORMAL HIGH (ref 11.5–14.5)
WBC: 3.6 10*3/uL (ref 3.6–11.0)

## 2017-10-21 LAB — COMPREHENSIVE METABOLIC PANEL
ALT: 33 U/L (ref 14–54)
AST: 64 U/L — AB (ref 15–41)
Albumin: 3.9 g/dL (ref 3.5–5.0)
Alkaline Phosphatase: 83 U/L (ref 38–126)
Anion gap: 8 (ref 5–15)
BILIRUBIN TOTAL: 0.9 mg/dL (ref 0.3–1.2)
BUN: 13 mg/dL (ref 6–20)
CHLORIDE: 105 mmol/L (ref 101–111)
CO2: 29 mmol/L (ref 22–32)
CREATININE: 0.83 mg/dL (ref 0.44–1.00)
Calcium: 9.4 mg/dL (ref 8.9–10.3)
GFR calc non Af Amer: >60 mL/min (ref >60)
Glucose, Bld: 107 mg/dL — ABNORMAL HIGH (ref 65–99)
Potassium: 3.8 mmol/L (ref 3.5–5.1)
Sodium: 142 mmol/L (ref 135–145)
TOTAL PROTEIN: 7 g/dL (ref 6.5–8.1)

## 2017-10-21 MED ORDER — HEPARIN SOD (PORK) LOCK FLUSH 100 UNIT/ML IV SOLN: 500.0000 [IU] | Freq: Once | INTRAVENOUS | Status: DC

## 2017-10-21 MED ORDER — FLUOROURACIL CHEMO INJECTION 2.5 GM/50ML
400.0000 mg/m2 | Freq: Once | INTRAVENOUS | Status: AC
  Administered 2017-10-21: 850 mg via INTRAVENOUS
  Filled 2017-10-21: qty 17

## 2017-10-21 MED ORDER — LEUCOVORIN CALCIUM INJECTION 350 MG: 400.0000 mg/m2 | Freq: Once | INTRAVENOUS | Status: DC

## 2017-10-21 MED ORDER — SODIUM CHLORIDE 0.9 % IV SOLN
2400.0000 mg/m2 | INTRAVENOUS | Status: DC
  Administered 2017-10-21: 5200 mg via INTRAVENOUS
  Filled 2017-10-21: qty 100

## 2017-10-21 MED ORDER — SODIUM CHLORIDE 0.9% FLUSH
10.0000 mL | Freq: Once | INTRAVENOUS | Status: AC
  Administered 2017-10-21: 10 mL via INTRAVENOUS
  Filled 2017-10-21: qty 10

## 2017-10-21 MED ORDER — DEXTROSE 5 % IV SOLN
Freq: Once | INTRAVENOUS | Status: AC
  Administered 2017-10-21: 09:00:00 via INTRAVENOUS
  Filled 2017-10-21: qty 1000

## 2017-10-21 MED ORDER — LEUCOVORIN CALCIUM INJECTION 100 MG
20.0000 mg/m2 | Freq: Once | INTRAMUSCULAR | Status: AC
  Administered 2017-10-21: 44 mg via INTRAVENOUS
  Filled 2017-10-21: qty 2.2

## 2017-10-21 NOTE — Progress Notes (Signed)
Hematology/Oncology Follow Up Note [facility]  [phone] [phone]  Patient Care Team: Tonia Ghent, MD as PCP - General (Family Medicine)   Name of the patient: [name]  [id number]  05-Aug-1934   REASON FOR VISIT Follow up of chemotherapy management of esophageal adenocarcinoma  HISTORY OF PRESENT ILLNESS Patient is a 83 year old female who has metastatic esophageal cancer with liver involvement.  Weight loss 20 pounds for the past year prior to diagnosis.  # EGD during admission showed partially obstructive esophageal mass at the distal part of esophagus. Biopsy was taken. Pathology showed esophageal adenocarcinoma. #US biopsy of liver lesion to proof distant metastasis.  Report family history of breast cancer, but she has been having routine mammograms.  # Lives alone. She's a former smoker.  # Molecular Testing:  #[employer] Cdx: No reportable alternations with companion diagnostic (CDx) claims.  MS stable Tumor mutation burden: Cannot be determined, CCND3 amplification: Equivocal.  Amended reports on 06/04/2017, Tumor mutational burden changed from can not be determined to "TMB -low" on the re-analyzed samples.  # HER 2 negative.   Current Treatment S/p 6 cycles of FOLFOX, PET scan showed excellent partial response from both chemotherapy and radiation, result was discussed with patient.  currently on 5-FU maintenance.  S/p palliative radiation in January 2019 # Developed esophageal stricture secondary to radiation in April and got dilation via endoscopy. EGD shoed Benign-appearing esophageal stenosis. This is a result of radiation therapy.- LA Grade C radiation esophagitis. - The previously noted esophageal mass in Nov 2018 was much decreased indicating good response to radiation/chemotherapy. - Normal stomach.- Normal examined jejunum.- No specimens collected.    INTERVAL HISTORY  Patient presents for evaluation  prior to Cycle 8 maintenance 5-FU chemotherapy for esophageal adenocarcinoma.   # Dysphagia: Takes Protonix 40 mg twice daily.  Status post multiple EGD dilation by Dr. Bonna Gains. Has mild dysphagia.  #Fatigue chronic stable. 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  Not worse. # Thrombocytopenia: stable. No bleeding events.   Review of systems Review of Systems  Constitutional: Positive for malaise/fatigue. Negative for chills, diaphoresis, fever and weight loss.  HENT: Negative for congestion, ear discharge, ear pain, hearing loss, nosebleeds, sinus pain and sore throat.   Eyes: Negative for double vision, photophobia, pain, discharge and redness.  Respiratory: Negative for cough, hemoptysis, sputum production, shortness of breath and wheezing.   Cardiovascular: Negative for chest pain, palpitations, orthopnea, claudication and leg swelling.  Gastrointestinal: Negative for abdominal pain, blood in stool, constipation, diarrhea, heartburn, melena, nausea and vomiting.  Genitourinary: Negative for dysuria, flank pain, frequency, hematuria and urgency.  Musculoskeletal: Negative for back pain, joint pain, myalgias and neck pain.  Skin: Negative for itching and rash.  Neurological: Positive for tingling and sensory change. Negative for dizziness, tremors, focal weakness, weakness and headaches.  Endo/Heme/Allergies: Negative for environmental allergies. Does not bruise/bleed easily.  Psychiatric/Behavioral: Negative for depression, hallucinations, substance abuse and suicidal ideas. The patient is not nervous/anxious.     Allergies  Allergen Reactions  . Cefuroxime Nausea Only  . Codeine Nausea Only  . Doxycycline Other (See Comments)    Lip swelling  . Gabapentin Other (See Comments)    Swelling.   . Iohexol Itching    07-15-13 pt developed itching on fingers after contrast given. Dr. Irish Elders looked at pt and said to put in system as allergy. BB    . Other Other (See Comments)    Contrast Dye-  caused fever, chills, awful feeling Bandages - itching, rash  . Oxycodone Other (See Comments)    nausea  . Quinolones Swelling    Lip swelling  . Synvisc [Hylan G-F 20] Swelling     Past Medical History:  Diagnosis Date  . Arthritis   . Asthma   . COPD (chronic obstructive pulmonary disease) ([place])   . Dysphonia   . Dyspnea   . Esophageal cancer ([place])   . GERD (gastroesophageal reflux disease)   . Heart murmur   . History of kidney stones   . Hypertension   . Hypokalemia 10/07/2017  . Melanoma ([place])   . OAB (overactive bladder)   . OSA on CPAP   . 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 Personal history of chemotherapy    esophageal cancer  . Personal history of radiation therapy    esophageal cancer  . Pupil asymmetry    R pupil defect  . Skin cancer   . Sleep apnea   . Spinal cord abscess      Past Surgical History:  Procedure Laterality Date  . ABDOMINAL HYSTERECTOMY    . ANTERIOR CERVICAL DECOMP/DISCECTOMY FUSION N/A 07/16/2013   Procedure: ANTERIOR CERVICAL DECOMPRESSION/DISCECTOMY FUSION mutlipleLEVELS C4-7;  Surgeon: Erline Levine, MD;  Location: [place] NEURO ORS;  Service: Neurosurgery;  Laterality: N/A;  . APPENDECTOMY    . carpel tunn Bilateral   . CATARACT EXTRACTION    . CATARACT EXTRACTION, BILATERAL    . CHOLECYSTECTOMY    . dental implant    . ESOPHAGOGASTRODUODENOSCOPY Left 03/21/2017   Procedure: ESOPHAGOGASTRODUODENOSCOPY (EGD);  Surgeon: Virgel Manifold, MD;  Location[facility]COPY;  Service: Endoscopy;  Laterality: Left;  . ESOPHAGOGASTRODUODENOSCOPY (EGD) WITH PROPOFOL N/A 08/20/2017   Procedure: ESOPHAGOGASTRODUODENOSCOPY (EGD) WITH PROPOFOL;  Surgeon: Virgel Manifold, MD;  Location: [facility] ENDOSCOPY;  Service: Endoscopy;  Laterality: N/A;  . ESOPHAGOGASTRODUODENOSCOPY (EGD) WITH PROPOFOL N/A 09/19/2017   Procedure: ESOPHAGOGASTRODUODENOSCOPY (EGD) WITH PROPOFOL;  Surgeon:  Virgel Manifold, MD;  Location: [facility] ENDOSCOPY;  Service: Endoscopy;  Laterality: N/A;  . ESOPHAGOGASTRODUODENOSCOPY (EGD) WITH PROPOFOL N/A 10/03/2017   Procedure: ESOPHAGOGASTRODUODENOSCOPY (EGD) WITH PROPOFOL;  Surgeon: Virgel Manifold, MD;  Location: [facility] ENDOSCOPY;  Service: Endoscopy;  Laterality: N/A;  . GASTRIC BYPASS    . HIATAL HERNIA REPAIR    . MELANOMA EXCISION    . PORTA CATH INSERTION N/A 04/07/2017   Procedure: PORTA CATH INSERTION;  Surgeon: Algernon Huxley, MD;  Location: [place] CV LAB;  Service: Cardiovascular;  Laterality: N/A;  . POSTERIOR CERVICAL FUSION/FORAMINOTOMY N/A 07/28/2013   Procedure: Cervical four-seven  posterior cervical fusion;  Surgeon: Erline Levine, MD;  Location: [place] NEURO ORS;  Service: Neurosurgery;  Laterality: N/A;  . TONSILLECTOMY    . TOTAL KNEE ARTHROPLASTY      Social History   Socioeconomic History  . Marital status: Widowed    Spouse name: Not on file  . Number of children: 2  . Years of education: Not on file  . Highest education level: Master's degree (e.g., MA, MS, MEng, MEd, MSW, MBA)  Occupational History  . Not on file  Social Needs  . Financial resource strain: Not hard at all  . Food insecurity:    Worry: Never true    Inability: Never true  . Transportation needs:    Medical: No    Non-medical: No  Tobacco Use  . Smoking status: Former Smoker    Packs/day: 1.00    Years: 20.00  Pack years: 20.00    Types: Cigarettes    Last attempt to quit: 1977    Years since quitting: 42.4  . Smokeless tobacco: Never Used  Substance and Sexual Activity  . Alcohol use: No  . Drug use: No  . Sexual activity: Never  Lifestyle  . Physical activity:    Days per week: 2 days    Minutes per session: 30 min  . Stress: Not at all  Relationships  . Social connections:    Talks on phone: More than three times a week    Gets together: More than three times a week    Attends religious service: More than 4 times per year      Active member of club or organization: Yes    Attends meetings of clubs or organizations: More than 4 times per year    Relationship status: Widowed  . Intimate partner violence:    Fear of current or ex partner: No    Emotionally abused: No    Physically abused: No    Forced sexual activity: No  Other Topics Concern  . Not on file  Social History Narrative   Marital Status- Widowed    Lives by herself    Employement- Retired Pharmacist, hospital   Exercise hx- Does PT     Family History  Problem Relation Age of Onset  . Breast cancer Mother 57  . Arthritis-Osteo Mother   . Breast cancer Sister 38  . Bladder Cancer Sister   . Bladder Cancer Father   . Tongue cancer Father   . Congenital heart disease Father   . Prostate cancer Brother   . Uterine cancer Maternal Aunt   . Lung cancer Paternal Uncle   . Leukemia Maternal Grandmother   . Liver cancer Paternal Grandmother   . Colon cancer Neg Hx      Current Outpatient Medications:  .  acetaminophen (TYLENOL) 325 MG tablet, Take 650 mg by mouth every 6 (six) hours as needed for moderate pain or headache. , Disp: , Rfl:  .  ALBUTEROL SULFATE HFA IN, Inhale into the lungs. , Disp: , Rfl:  .  bisacodyl (DULCOLAX) 10 MG suppository, Place rectally., Disp: , Rfl:  .  calcium carbonate (TUMS EX) 750 MG chewable tablet, Chew by mouth., Disp: , Rfl:  .  Cholecalciferol (VITAMIN D) 2000 UNITS CAPS, Take 2,000 Units by mouth daily., Disp: , Rfl:  .  cyanocobalamin 500 MCG tablet, Take 500 mcg every other day by mouth., Disp: , Rfl:  .  Diclofenac Sodium 1 % CREA, Apply to left arm as needed for pain three times daily (Patient not taking: Reported on 10/07/2017), Disp: 120 g, Rfl: 0 .  fluconazole (DIFLUCAN) 200 MG tablet, Take 2 tablets (400 mg total) by mouth daily for 1 day, THEN 1 tablet (200 mg total) daily for 14 days., Disp: 16 tablet, Rfl: 0 .  fluticasone (FLONASE) 50 MCG/ACT nasal spray, Place 1-2 sprays into both nostrils daily.  (Patient not taking: Reported on 10/07/2017), Disp: 16 g, Rfl: 5 .  furosemide (LASIX) 20 MG tablet, Take 1 tablet (20 mg total) by mouth daily., Disp: 90 tablet, Rfl: 3 .  hydrocortisone cream 1 %, Apply 1 application topically daily as needed for itching. , Disp: , Rfl:  .  lidocaine-prilocaine (EMLA) cream, Apply to affected area once (Patient not taking: Reported on 10/07/2017), Disp: 30 g, Rfl: 3 .  loperamide (IMODIUM) 2 MG capsule, Take 1 capsule (2 mg total) by mouth See  admin instructions. With onset of loose stool, take 41m followed by 280mevery 2 hours until 12 hours have passed without loose bowel movement. Maximum: 16 mg/day (Patient not taking: Reported on 10/07/2017), Disp: 120 capsule, Rfl: 0 .  loratadine (CLARITIN) 10 MG tablet, Take 10 mg by mouth daily., Disp: , Rfl:  .  LYRICA 150 MG capsule, TAKE 1 CAPSULE BY MOUTH TWO TIMES DAILY, Disp: 180 capsule, Rfl: 1 .  metoprolol succinate (TOPROL-XL) 50 MG 24 hr tablet, TAKE 1 TABLET BY MOUTH DAILY, Disp: 90 tablet, Rfl: 3 .  ondansetron (ZOFRAN) 8 MG tablet, Take 1 tablet (8 mg total) by mouth 2 (two) times daily as needed for refractory nausea / vomiting. Start on day 3 after chemotherapy. (Patient not taking: Reported on 10/07/2017), Disp: 30 tablet, Rfl: 1 .  oxybutynin (DITROPAN) 5 MG tablet, TAKE 1 TABLET BY MOUTH EVERY MORNING AND1/2 TABLET AT BEDTIME, Disp: 135 tablet, Rfl: 0 .  pantoprazole (PROTONIX) 40 MG tablet, Take 1 tablet (40 mg total) by mouth 2 (two) times daily before a meal., Disp: 60 tablet, Rfl: 1 .  Pediatric Multiple Vitamins (FLINTSTONES MULTIVITAMIN PO), Take by mouth., Disp: , Rfl:  .  Polyethyl Glycol-Propyl Glycol (SYSTANE OP), Apply 1 drop to eye daily as needed (dry eyes)., Disp: , Rfl:  .  potassium chloride (KLOR-CON) 20 MEQ packet, Take 20 mEq by mouth as needed (Daily as needed with fluid pill)., Disp: 30 packet, Rfl: 1 .  prochlorperazine (COMPAZINE) 10 MG tablet, Take 1 tablet (10 mg total) by mouth every  6 (six) hours as needed (Nausea or vomiting). (Patient not taking: Reported on 10/03/2017), Disp: 30 tablet, Rfl: 1 .  pyridOXINE (VITAMIN B-6) 100 MG tablet, Take 1 tablet (100 mg total) by mouth daily., Disp: 30 tablet, Rfl: 3 .  senna-docusate (SENOKOT-S) 8.6-50 MG tablet, Take 1 tablet at bedtime as needed by mouth for mild constipation., Disp: , Rfl:  .  sucralfate (CARAFATE) 1 GM/10ML suspension, Take 10 mLs (1 g total) by mouth 4 (four) times daily., Disp: 420 mL, Rfl: 1 .  traMADol (ULTRAM) 50 MG tablet, TAKE 1 TABLET BY MOUTH 3 TIMES DAILY AS NEEDED FOR PAIN, Disp: 30 tablet, Rfl: 0 .  traZODone (DESYREL) 50 MG tablet, TAKE 1/2 TABLET BY MOUTH AT BEDTIME IF NEEDED FOR SLEEP, Disp: 45 tablet, Rfl: 3 No current facility-administered medications for this visit.   Facility-Administered Medications Ordered in Other Visits:  .  heparin lock flush 100 unit/mL, 500 Units, Intravenous, Once, YuEarlie ServerMD  Physical exam:  There were no vitals filed for this visit.ECOG 1 Physical Exam  Constitutional: She is oriented to person, place, and time and well-developed, well-nourished, and in no distress. No distress.  HENT:  Head: Normocephalic and atraumatic.  Nose: Nose normal.  Mouth/Throat: Oropharynx is clear and moist. No oropharyngeal exudate.  Eyes: Pupils are equal, round, and reactive to light. Conjunctivae and EOM are normal. Left eye exhibits no discharge. No scleral icterus.  Neck: Normal range of motion. Neck supple. No JVD present.  Cardiovascular: Normal rate and regular rhythm.  Murmur heard. Pulmonary/Chest: Effort normal and breath sounds normal. No respiratory distress. She has no wheezes. She has no rales. She exhibits no tenderness.  Abdominal: Soft. Bowel sounds are normal. She exhibits no distension and no mass. There is no tenderness. There is no rebound and no guarding.  Musculoskeletal: Normal range of motion. She exhibits no edema, tenderness or deformity.   Lymphadenopathy:    She has no  cervical adenopathy.  Neurological: She is alert and oriented to person, place, and time. No cranial nerve deficit. She exhibits normal muscle tone. Coordination normal.  Skin: Skin is warm and dry. No rash noted. She is not diaphoretic. No erythema.  Psychiatric: Affect and judgment normal.    CMP Latest Ref Rng & Units 10/07/2017  Glucose 65 - 99 mg/dL 110(H)  BUN 6 - 20 mg/dL 15  Creatinine 0.44 - 1.00 mg/dL 0.86  Sodium 135 - 145 mmol/L 142  Potassium 3.5 - 5.1 mmol/L 3.3(L)  Chloride 101 - 111 mmol/L 103  CO2 22 - 32 mmol/L 27  Calcium 8.9 - 10.3 mg/dL 9.3  Total Protein 6.5 - 8.1 g/dL 6.6  Total Bilirubin 0.3 - 1.2 mg/dL 0.5  Alkaline Phos 38 - 126 U/L 76  AST 15 - 41 U/L 39  ALT 14 - 54 U/L 17   CBC Latest Ref Rng & Units 10/07/2017  WBC 3.6 - 11.0 K/uL 3.6  Hemoglobin 12.0 - 16.0 g/dL 12.5  Hematocrit 35.0 - 47.0 % 36.0  Platelets 150 - 440 K/uL 146(L)     Assessment and plan  Cancer Staging Malignant neoplasm of lower third of esophagus (HCC) Staging form: Esophagus - Adenocarcinoma, AJCC 8th Edition - Clinical stage from 04/10/2017: Stage IVB (cTX, cNX, pM1) - Signed by Earlie Server, MD on 04/10/2017  1. Malignant neoplasm of lower third of esophagus (HCC)   2. Encounter for antineoplastic chemotherapy   3. Esophageal stricture   4. Iron deficiency anemia due to chronic blood loss   5. Neuropathy   6. Hypokalemia    # Proceed with cycle 8 maintenance 5-FU bolus and a 48-hour infusion.  She tolerates chemotherapy well. Esophageal cancer: Interval PET scan Independently reviewed by me and discussed with patient. She has complete remission.  CEA at 2.1. She will be on vacation in 2 weeks.  We will give her a drug break, she will come back in 3 weeks to be started on next cycle of maintenance 5-FU. #Esophageal stricture: Secondary to radiation.  Status post dilation via endoscopy multiple times.  Continue monitor.  Continue Protonix 40  mg twice daily.  Follow-up with GI for repeat procedure if needed.    # Iron deficiency anemia: Iron panel reviewed.  Ferritin gradually decreased.  Continue monitor iron panel periodically.  Plan IV iron if ferritin significantly reduced. # Hypokalemia: due to lasix use.  Continue potassium supplements as instructed by her cardiologist.  Potassium stable today at 3.8. # Pre-existing peripheral neuropathy: Stable.  Continue use of Lyrica and B6 supplementation.  # Thrombocytopenia: slightly worse than last visit. Due to chemotherapy. Continue monitor.   Follow up on 7/1 for  assessment prior to next cycle of 5-FU maintenance treatment.  Patient knows to call if any concerns or questions.  Orders Placed This Encounter  Procedures  . Iron and TIBC    Standing Status:   Future    Standing Expiration Date:   10/22/2018  . Ferritin    Standing Status:   Future    Standing Expiration Date:   10/22/2018    Earlie Server, MD, PhD Hematology Oncology [facility] Pager- [phone] 10/21/17

## 2017-10-21 NOTE — Progress Notes (Signed)
Patient here today for follow up. Pt states that she has increased joint pain.

## 2017-10-22 ENCOUNTER — Encounter: Payer: Self-pay | Admitting: *Deleted

## 2017-10-22 ENCOUNTER — Ambulatory Visit: Payer: Medicare Other | Admitting: Gastroenterology

## 2017-10-22 ENCOUNTER — Ambulatory Visit: Payer: Medicare Other | Admitting: Certified Registered"

## 2017-10-22 ENCOUNTER — Encounter
Admission: RE | Disposition: A | Discharge status: Home / Self Care | Payer: Self-pay | Source: Ambulatory Visit | Attending: Gastroenterology

## 2017-10-22 ENCOUNTER — Ambulatory Visit
Admission: RE | Admit: 2017-10-22 | Discharge: 2017-10-22 | Disposition: A | Discharge status: Home / Self Care | Payer: Medicare Other | Source: Ambulatory Visit | Attending: Gastroenterology | Admitting: Gastroenterology

## 2017-10-22 DIAGNOSIS — Z79899 Other long term (current) drug therapy: Secondary | ICD-10-CM | POA: Insufficient documentation

## 2017-10-22 DIAGNOSIS — I10 Essential (primary) hypertension: Secondary | ICD-10-CM | POA: Diagnosis not present

## 2017-10-22 DIAGNOSIS — K222 Esophageal obstruction: Secondary | ICD-10-CM | POA: Diagnosis present

## 2017-10-22 DIAGNOSIS — K289 Gastrojejunal ulcer, unspecified as acute or chronic, without hemorrhage or perforation: Secondary | ICD-10-CM

## 2017-10-22 DIAGNOSIS — G4733 Obstructive sleep apnea (adult) (pediatric): Secondary | ICD-10-CM | POA: Insufficient documentation

## 2017-10-22 DIAGNOSIS — R1310 Dysphagia, unspecified: Secondary | ICD-10-CM

## 2017-10-22 DIAGNOSIS — K219 Gastro-esophageal reflux disease without esophagitis: Secondary | ICD-10-CM | POA: Diagnosis not present

## 2017-10-22 DIAGNOSIS — J449 Chronic obstructive pulmonary disease, unspecified: Secondary | ICD-10-CM | POA: Insufficient documentation

## 2017-10-22 DIAGNOSIS — Z87891 Personal history of nicotine dependence: Secondary | ICD-10-CM | POA: Diagnosis not present

## 2017-10-22 DIAGNOSIS — I2510 Atherosclerotic heart disease of native coronary artery without angina pectoris: Secondary | ICD-10-CM | POA: Insufficient documentation

## 2017-10-22 DIAGNOSIS — R1319 Other dysphagia: Secondary | ICD-10-CM

## 2017-10-22 HISTORY — PX: ESOPHAGOGASTRODUODENOSCOPY (EGD) WITH PROPOFOL: SHX5813

## 2017-10-22 LAB — CEA: CEA1: 2.3 ng/mL (ref 0.0–4.7)

## 2017-10-22 SURGERY — ESOPHAGOGASTRODUODENOSCOPY (EGD) WITH PROPOFOL
Anesthesia: General

## 2017-10-22 MED ORDER — LIDOCAINE HCL (PF) 2 % IJ SOLN
INTRAMUSCULAR | Status: AC
  Filled 2017-10-22: qty 10

## 2017-10-22 MED ORDER — SODIUM CHLORIDE 0.9 % IV SOLN
INTRAVENOUS | Status: DC
  Administered 2017-10-22: 1000 mL via INTRAVENOUS

## 2017-10-22 MED ORDER — PROPOFOL 500 MG/50ML IV EMUL
INTRAVENOUS | Status: AC
  Filled 2017-10-22: qty 50

## 2017-10-22 MED ORDER — LIDOCAINE HCL (CARDIAC) PF 100 MG/5ML IV SOSY
PREFILLED_SYRINGE | INTRAVENOUS | Status: DC | PRN
  Administered 2017-10-22: 50 mg via INTRAVENOUS

## 2017-10-22 MED ORDER — GLYCOPYRROLATE 0.2 MG/ML IJ SOLN
INTRAMUSCULAR | Status: AC
Start: 2017-10-22 — End: ?
  Filled 2017-10-22: qty 1

## 2017-10-22 MED ORDER — GLYCOPYRROLATE 0.2 MG/ML IJ SOLN
INTRAMUSCULAR | Status: DC | PRN
  Administered 2017-10-22: 0.1 mg via INTRAVENOUS

## 2017-10-22 MED ORDER — PROPOFOL 10 MG/ML IV BOLUS
INTRAVENOUS | Status: DC | PRN
  Administered 2017-10-22: 100 mg via INTRAVENOUS
  Administered 2017-10-22 (×7): 20 mg via INTRAVENOUS
  Administered 2017-10-22: 30 mg via INTRAVENOUS

## 2017-10-22 NOTE — Anesthesia Post-op Follow-up Note (Signed)
Anesthesia QCDR form completed.        

## 2017-10-22 NOTE — Op Note (Signed)
[facility] Gastroenterology Patient Name: [name] Procedure Date: 10/22/2017 7:12 AM MRN: [id number] Account #: [id number] Date of Birth: 1935-01-07 Admit Type: Outpatient Age: 83 Room: [facility] ENDO ROOM 2 Gender: Female Note Status: Finalized Procedure:            Upper GI endoscopy Indications:          Dysphagia, Stricture of the esophagus, history of                        gastric bypass. Pt. reports improvement in dysphagia                        after last EGD two weeks ago, but return of dysphagia                        over the last week. Providers:            Lennette Bihari. Bonna Gains MD, MD Referring MD:         Elveria Rising. Damita Dunnings, MD (Referring MD) Medicines:            Monitored Anesthesia Care Complications:        No immediate complications. Procedure:            Pre-Anesthesia Assessment:                       - The risks and benefits of the procedure and the                        sedation options and risks were discussed with the                        patient. All questions were answered and informed                        consent was obtained.                       - Patient identification and proposed procedure were                        verified prior to the procedure.                       - ASA Grade Assessment: III - A patient with severe                        systemic disease.                       After obtaining informed consent, the endoscope was                        passed under direct vision. Throughout the procedure,                        the patient's blood pressure, pulse, and oxygen                        saturations were monitored continuously. The Endoscope  was introduced through the mouth, and advanced to the                        jejunum. The upper GI endoscopy was accomplished with                        ease. The patient tolerated the procedure well. Findings:      One moderate stenosis was  found. This stenosis measured 1 cm (in       length). The stenosis was traversed after dilation (Scope did not pass       with dilation to 77mm, and had to be dilated to 74mm before scope was       able to be advanced past the stricture). A TTS dilator was passed       through the scope. Dilation with a 02-21-11 mm balloon dilator was       performed to 12 mm. The dilation site was examined and showed moderate       improvement in luminal narrowing.      There is no endoscopic evidence of mass in the entire esophagus.      The entire examined stomach was normal.      One non-bleeding superficial ulcer with no stigmata of bleeding was       found at the anastomosis. The lesion was 6 mm in largest dimension. Impression:           - Esophageal stenosis. Dilated.                       - Previously seen white lesions on her previous EGD                        were not present (pt. is on Fluconazole as koh prep was                        positive on last EGD)                       - Normal stomach.                       - Post gastric bypass anatomy noted.                       - One non-bleeding jejunal ulcer at the anastomosis                        with no stigmata of bleeding.                       - No specimens collected. Recommendation:       - Use Protonix (pantoprazole) 40 mg PO BID.                       - Use sucralfate suspension 1 gram PO QID.                       - Follow an antireflux regimen.                       - Soft diet.                       -  Return to my office in 2 weeks.                       - Return to primary care physician as previously                        scheduled.                       - Continue present medications.                       - The findings and recommendations were discussed with                        the patient.                       - The findings and recommendations were discussed with                        the patient's  family. Procedure Code(s):    --- Professional ---                       [phone], Esophagogastroduodenoscopy, flexible, transoral;                        with transendoscopic balloon dilation of esophagus                        (less than 30 mm diameter) Diagnosis Code(s):    --- Professional ---                       K22.2, Esophageal obstruction                       K28.9, Gastrojejunal ulcer, unspecified as acute or                        chronic, without hemorrhage or perforation                       R13.10, Dysphagia, unspecified CPT copyright 2017 American Medical Association. All rights reserved. The codes documented in this report are preliminary and upon coder review may  be revised to meet current compliance requirements.  [name], MD Margretta Sidle B. Bonna Gains MD, MD 10/22/2017 8:30:24 AM This report has been signed electronically. Number of Addenda: 0 Note Initiated On: 10/22/2017 7:12 AM Estimated Blood Loss: Estimated blood loss: none.      [facility]

## 2017-10-22 NOTE — H&P (Signed)
[name], MD [address], [place], [place], [place], [address] [address], [place], [place], [place], [address] Phone: [phone]  Fax: [phone]  Primary Care Physician:  Tonia Ghent, MD   Pre-Procedure History & Physical: HPI:  [name] is a 83 y.o. female is here for an EGD.   Past Medical History:  Diagnosis Date  . Arthritis   . Asthma   . COPD (chronic obstructive pulmonary disease) ([place])   . Dysphonia   . Dyspnea   . Esophageal cancer ([place])   . GERD (gastroesophageal reflux disease)   . Heart murmur   . History of kidney stones   . Hypertension   . Hypokalemia 10/07/2017  . Melanoma ([place])   . OAB (overactive bladder)   . OSA on CPAP   . 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 Personal history of chemotherapy    esophageal cancer  . Personal history of radiation therapy    esophageal cancer  . Pupil asymmetry    R pupil defect  . Skin cancer   . Sleep apnea   . Spinal cord abscess     Past Surgical History:  Procedure Laterality Date  . ABDOMINAL HYSTERECTOMY    . ANTERIOR CERVICAL DECOMP/DISCECTOMY FUSION N/A 07/16/2013   Procedure: ANTERIOR CERVICAL DECOMPRESSION/DISCECTOMY FUSION mutlipleLEVELS C4-7;  Surgeon: Erline Levine, MD;  Location: [facility];  Service: Neurosurgery;  Laterality: N/A;  . APPENDECTOMY    . carpel tunn Bilateral   . CATARACT EXTRACTION    . CATARACT EXTRACTION, BILATERAL    . CHOLECYSTECTOMY    . dental implant    . ESOPHAGOGASTRODUODENOSCOPY Left 03/21/2017   Procedure: ESOPHAGOGASTRODUODENOSCOPY (EGD);  Surgeon: Virgel Manifold, MD;  Location: [facility] END[facility]COPY;  Service: Endoscopy;  Laterality: Left;  . ESOPHAGOGASTRODUODENOSCOPY (EGD) WITH PROPOFOL N/A 08/20/2017   Procedure: ESOPHAGOGASTRODUODENOSCOPY (EGD) WITH PROPOFOL;  Surgeon: Virgel Manifold, MD;  Location: [facility] ENDOSCOPY;  Service: Endoscopy;  Laterality: N/A;  . ESOPHAGOGASTRODUODENOSCOPY (EGD) WITH PROPOFOL N/A  09/19/2017   Procedure: ESOPHAGOGASTRODUODENOSCOPY (EGD) WITH PROPOFOL;  Surgeon: Virgel Manifold, MD;  Location: [facility] ENDOSCOPY;  Service: Endoscopy;  Laterality: N/A;  . ESOPHAGOGASTRODUODENOSCOPY (EGD) WITH PROPOFOL N/A 10/03/2017   Procedure: ESOPHAGOGASTRODUODENOSCOPY (EGD) WITH PROPOFOL;  Surgeon: Virgel Manifold, MD;  Location: [facility] ENDOSCOPY;  Service: Endoscopy;  Laterality: N/A;  . GASTRIC BYPASS    . HIATAL HERNIA REPAIR    . MELANOMA EXCISION    . PORTA CATH INSERTION N/A 04/07/2017   Procedure: PORTA CATH INSERTION;  Surgeon: Algernon Huxley, MD;  Location: [facility] CV LAB;  Service: Cardiovascular;  Laterality: N/A;  . POSTERIOR CERVICAL FUSION/FORAMINOTOMY N/A 07/28/2013   Procedure: Cervical four-seven  posterior cervical fusion;  Surgeon: Erline Levine, MD;  Location: [facility];  Service: Neurosurgery;  Laterality: N/A;  . TONSILLECTOMY    . TOTAL KNEE ARTHROPLASTY      Prior to Admission medications   Medication Sig Start Date End Date Taking? Authorizing Provider  acetaminophen (TYLENOL) 325 MG tablet Take 650 mg by mouth every 6 (six) hours as needed for moderate pain or headache.    Yes [provider]  metoprolol succinate (TOPROL-XL) 50 MG 24 hr tablet TAKE 1 TABLET BY MOUTH DAILY 01/14/17  Yes Tonia Ghent, MD  oxybutynin (DITROPAN) 5 MG tablet TAKE 1 TABLET BY MOUTH EVERY MORNING AND1/2 TABLET AT BEDTIME 10/08/17  Yes Tonia Ghent, MD  traMADol (ULTRAM) 50 MG tablet TAKE 1 TABLET BY  MOUTH 3 TIMES DAILY AS NEEDED FOR PAIN 10/10/17  Yes Earlie Server, MD  ALBUTEROL SULFATE HFA IN Inhale into the lungs.  07/07/15   [provider]  bisacodyl (DULCOLAX) 10 MG suppository Place rectally.    [provider]  calcium carbonate (TUMS EX) 750 MG chewable tablet Chew by mouth.    [provider]  Cholecalciferol (VITAMIN D) 2000 UNITS CAPS Take 2,000 Units by mouth daily.    [provider]  cyanocobalamin 500 MCG tablet  Take 500 mcg every other day by mouth.    [provider]  Diclofenac Sodium 1 % CREA Apply to left arm as needed for pain three times daily Patient not taking: Reported on 10/07/2017 04/23/17   Earlie Server, MD  fluconazole (DIFLUCAN) 200 MG tablet Take 2 tablets (400 mg total) by mouth daily for 1 day, THEN 1 tablet (200 mg total) daily for 14 days. 10/09/17 10/24/17  Virgel Manifold, MD  fluticasone (FLONASE) 50 MCG/ACT nasal spray Place 1-2 sprays into both nostrils daily. 06/13/17   Tonia Ghent, MD  furosemide (LASIX) 20 MG tablet Take 1 tablet (20 mg total) by mouth daily. 08/14/17   Minna Merritts, MD  hydrocortisone cream 1 % Apply 1 application topically daily as needed for itching.     [provider]  lidocaine-prilocaine (EMLA) cream Apply to affected area once 04/10/17   Earlie Server, MD  loperamide (IMODIUM) 2 MG capsule Take 1 capsule (2 mg total) by mouth See admin instructions. With onset of loose stool, take 4mg  followed by 2mg  every 2 hours until 12 hours have passed without loose bowel movement. Maximum: 16 mg/day 04/14/17   Earlie Server, MD  loratadine (CLARITIN) 10 MG tablet Take 10 mg by mouth daily.    [provider]  LYRICA 150 MG capsule TAKE 1 CAPSULE BY MOUTH TWO TIMES DAILY 01/06/17   Tonia Ghent, MD  ondansetron (ZOFRAN) 8 MG tablet Take 1 tablet (8 mg total) by mouth 2 (two) times daily as needed for refractory nausea / vomiting. Start on day 3 after chemotherapy. 06/30/17   Earlie Server, MD  pantoprazole (PROTONIX) 40 MG tablet Take 1 tablet (40 mg total) by mouth 2 (two) times daily before a meal. 09/09/17 10/09/17  Earlie Server, MD  Pediatric Multiple Vitamins (FLINTSTONES MULTIVITAMIN PO) Take by mouth.    [provider]  Polyethyl Glycol-Propyl Glycol (SYSTANE OP) Apply 1 drop to eye daily as needed (dry eyes).    [provider]  potassium chloride (KLOR-CON) 20 MEQ packet Take 20 mEq by mouth as needed (Daily as needed with fluid  pill). 08/14/17   Minna Merritts, MD  prochlorperazine (COMPAZINE) 10 MG tablet Take 1 tablet (10 mg total) by mouth every 6 (six) hours as needed (Nausea or vomiting). 06/30/17   Earlie Server, MD  pyridOXINE (VITAMIN B-6) 100 MG tablet Take 1 tablet (100 mg total) by mouth daily. 04/28/17   Earlie Server, MD  senna-docusate (SENOKOT-S) 8.6-50 MG tablet Take 1 tablet at bedtime as needed by mouth for mild constipation. 03/22/17   Gouru, Illene Silver, MD  sucralfate (CARAFATE) 1 GM/10ML suspension Take 10 mLs (1 g total) by mouth 4 (four) times daily. 09/19/17   Virgel Manifold, MD  traZODone (DESYREL) 50 MG tablet TAKE 1/2 TABLET BY MOUTH AT BEDTIME IF NEEDED FOR SLEEP 04/17/17   Tonia Ghent, MD    Allergies as of 10/07/2017 - Review Complete 10/07/2017  Allergen Reaction Noted  .  Cefuroxime Nausea Only 07/11/2015  . Codeine Nausea Only 01/22/2011  . Doxycycline Other (See Comments) 12/03/2013  . Gabapentin Other (See Comments) 12/19/2016  . Iohexol Itching 07/15/2013  . Other Other (See Comments) 12/03/2013  . Oxycodone Other (See Comments) 12/19/2016  . Quinolones Swelling 12/03/2013  . Synvisc [hylan g-f 20] Swelling 01/22/2011    Family History  Problem Relation Age of Onset  . Breast cancer Mother 57  . Arthritis-Osteo Mother   . Breast cancer Sister 59  . Bladder Cancer Sister   . Bladder Cancer Father   . Tongue cancer Father   . Congenital heart disease Father   . Prostate cancer Brother   . Uterine cancer Maternal Aunt   . Lung cancer Paternal Uncle   . Leukemia Maternal Grandmother   . Liver cancer Paternal Grandmother   . Colon cancer Neg Hx     Social History   Socioeconomic History  . Marital status: Widowed    Spouse name: Not on file  . Number of children: 2  . Years of education: Not on file  . Highest education level: Master's degree (e.g., MA, MS, MEng, MEd, MSW, MBA)  Occupational History  . Not on file  Social Needs  . Financial resource strain: Not hard  at all  . Food insecurity:    Worry: Never true    Inability: Never true  . Transportation needs:    Medical: No    Non-medical: No  Tobacco Use  . Smoking status: Former Smoker    Packs/day: 1.00    Years: 20.00    Pack years: 20.00    Types: Cigarettes    Last attempt to quit: 1977    Years since quitting: 42.4  . Smokeless tobacco: Never Used  Substance and Sexual Activity  . Alcohol use: No  . Drug use: No  . Sexual activity: Never  Lifestyle  . Physical activity:    Days per week: 2 days    Minutes per session: 30 min  . Stress: Not at all  Relationships  . Social connections:    Talks on phone: More than three times a week    Gets together: More than three times a week    Attends religious service: More than 4 times per year    Active member of club or organization: Yes    Attends meetings of clubs or organizations: More than 4 times per year    Relationship status: Widowed  . Intimate partner violence:    Fear of current or ex partner: No    Emotionally abused: No    Physically abused: No    Forced sexual activity: No  Other Topics Concern  . Not on file  Social History Narrative   Marital Status- Widowed    Lives by herself    Employement- Retired Pharmacist, hospital   Exercise hx- Does PT     Review of Systems: See HPI, otherwise negative ROS  Physical Exam: BP (!) 124/57   Pulse 63   Temp (!) 96.8 F (36 C) (Tympanic)   Resp 18   Ht 5' 2.5" (1.588 m)   Wt 210 lb (95.3 kg)   SpO2 98%   BMI 37.80 kg/m  General:   Alert,  pleasant and cooperative in NAD Head:  Normocephalic and atraumatic. Neck:  Supple; no masses or thyromegaly. Lungs:  Clear throughout to auscultation, normal respiratory effort.    Heart:  +S1, +S2, Regular rate and rhythm, No edema. Abdomen:  Soft, nontender and nondistended.  Normal bowel sounds, without guarding, and without rebound.   Neurologic:  Alert and  oriented x4;  grossly normal neurologically.  Impression/Plan: Larry Sierras  Amb[place]ia is here for an EGD for dysphagia and esophageal stricture  Risks, benefits, limitations, and alternatives regarding the procedure have been reviewed with the patient.  Questions have been answered.  All parties agreeable.   Virgel Manifold, MD  10/22/2017, 7:58 AM

## 2017-10-22 NOTE — Transfer of Care (Signed)
Immediate Anesthesia Transfer of Care Note  Patient: [name]  Procedure(s) Performed: ESOPHAGOGASTRODUODENOSCOPY (EGD) WITH PROPOFOL (N/A )  Patient Location: PACU and Endoscopy Unit  Anesthesia Type:General  Level of Consciousness: drowsy and patient cooperative  Airway & Oxygen Therapy: Patient Spontanous Breathing and Patient connected to nasal cannula oxygen  Post-op Assessment: Report given to RN, Post -op Vital signs reviewed and stable and Patient moving all extremities  Post vital signs: Reviewed and stable  Last Vitals:  Vitals Value Taken Time  BP    Temp    Pulse    Resp    SpO2      Last Pain:  Vitals:   10/22/17 0729  TempSrc: Tympanic  PainSc: 0-No pain         Complications: No apparent anesthesia complications

## 2017-10-22 NOTE — Anesthesia Preprocedure Evaluation (Signed)
Anesthesia Evaluation  Patient identified by MRN, date of birth, ID band Patient awake    Reviewed: Allergy & Precautions, NPO status , Patient's Chart, lab work & pertinent test results, reviewed documented beta blocker date and time   History of Anesthesia Complications Negative for: history of anesthetic complications  Airway Mallampati: II       Dental  (+) Dental Advidsory Given   Pulmonary neg shortness of breath, asthma , sleep apnea and Continuous Positive Airway Pressure Ventilation , COPD,  COPD inhaler, neg recent URI, former smoker,           Cardiovascular hypertension, Pt. on medications and Pt. on home beta blockers + CAD and + Peripheral Vascular Disease  (-) Past MI and (-) CHF (-) dysrhythmias + Valvular Problems/Murmurs (murmur, no tx)      Neuro/Psych neg Seizures    GI/Hepatic Neg liver ROS, GERD  Medicated,  Endo/Other  neg diabetes  Renal/GU negative Renal ROS     Musculoskeletal   Abdominal   Peds  Hematology   Anesthesia Other Findings Past Medical History: No date: Arthritis No date: Asthma No date: COPD (chronic obstructive pulmonary disease) ([facility]) No date: Dysphonia No date: Dyspnea No date: Esophageal cancer ([facility]) No date: GERD (gastroesophageal reflux disease) No date: Heart murmur No date: History of kidney stones No date: Hypertension 10/07/2017: Hypokalemia No date: Melanoma ([facility]) No date: OAB (overactive bladder) No date: OSA on CPAP No date: Paresthesia     Comment:  from neck down after spinal abscess No date: Personal history of chemotherapy     Comment:  esophageal cancer No date: Personal history of radiation therapy     Comment:  esophageal cancer No date: Pupil asymmetry     Comment:  R pupil defect No date: Skin cancer No date: Sleep apnea 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   Reproductive/Obstetrics                             Anesthesia  Physical  Anesthesia Plan  ASA: III  Anesthesia Plan: General   Post-op Pain Management:    Induction: Intravenous  PONV Risk Score and Plan: 3 and Propofol infusion, TIVA and Treatment may vary due to age or medical condition  Airway Management Planned: Nasal Cannula  Additional Equipment:   Intra-op Plan:   Post-operative Plan:   Informed Consent: I have reviewed the patients History and Physical, chart, labs and discussed the procedure including the risks, benefits and alternatives for the proposed anesthesia with the patient or authorized representative who has indicated his/her understanding and acceptance.     Plan Discussed with:   Anesthesia Plan Comments:         Anesthesia Quick Evaluation  

## 2017-10-22 NOTE — Anesthesia Postprocedure Evaluation (Signed)
Anesthesia Post Note  Patient: [name]  Procedure(s) Performed: ESOPHAGOGASTRODUODENOSCOPY (EGD) WITH PROPOFOL (N/A )  Patient location during evaluation: Endoscopy Anesthesia Type: General Level of consciousness: awake and alert, oriented and patient cooperative Pain management: satisfactory to patient Vital Signs Assessment: post-procedure vital signs reviewed and stable Respiratory status: spontaneous breathing and respiratory function stable Cardiovascular status: blood pressure returned to baseline and stable Postop Assessment: no headache, no backache, patient able to bend at knees, no apparent nausea or vomiting, adequate PO intake and able to ambulate Anesthetic complications: no     Last Vitals:  Vitals:   10/22/17 0729 10/22/17 0821  BP: (!) 124/57 (!) 110/49  Pulse: 63 66  Resp: 18 16  Temp: (!) 36 C (!) 36.2 C  SpO2: 98% 93%    Last Pain:  Vitals:   10/22/17 0821  TempSrc:   PainSc: Asleep                 Zale Marcotte H Tashaya Ancrum

## 2017-10-23 ENCOUNTER — Inpatient Hospital Stay: Payer: Medicare Other

## 2017-10-23 VITALS — BP 130/71 | HR 66 | Temp 98.7°F | Resp 20

## 2017-10-23 DIAGNOSIS — C155 Malignant neoplasm of lower third of esophagus: Secondary | ICD-10-CM

## 2017-10-23 MED ORDER — SODIUM CHLORIDE 0.9% FLUSH
10.0000 mL | INTRAVENOUS | Status: DC | PRN
  Administered 2017-10-23: 10 mL
  Filled 2017-10-23: qty 10

## 2017-10-23 MED ORDER — HEPARIN SOD (PORK) LOCK FLUSH 100 UNIT/ML IV SOLN
500.0000 [IU] | Freq: Once | INTRAVENOUS | Status: AC | PRN
  Administered 2017-10-23: 500 [IU]

## 2017-10-23 MED ORDER — HEPARIN SOD (PORK) LOCK FLUSH 100 UNIT/ML IV SOLN
INTRAVENOUS | Status: AC
  Filled 2017-10-23: qty 5

## 2017-10-28 ENCOUNTER — Other Ambulatory Visit: Payer: Self-pay | Admitting: Family Medicine

## 2017-10-28 ENCOUNTER — Other Ambulatory Visit: Payer: Self-pay | Admitting: Oncology

## 2017-10-28 ENCOUNTER — Encounter: Payer: Self-pay | Admitting: Gastroenterology

## 2017-10-28 ENCOUNTER — Other Ambulatory Visit: Payer: Self-pay | Admitting: Gastroenterology

## 2017-11-03 ENCOUNTER — Ambulatory Visit: Payer: Medicare Other | Admitting: Gastroenterology

## 2017-11-10 ENCOUNTER — Inpatient Hospital Stay: Payer: Medicare Other | Attending: Oncology

## 2017-11-10 ENCOUNTER — Inpatient Hospital Stay: Payer: Medicare Other

## 2017-11-10 ENCOUNTER — Encounter: Payer: Self-pay | Admitting: Oncology

## 2017-11-10 ENCOUNTER — Other Ambulatory Visit: Payer: Self-pay

## 2017-11-10 ENCOUNTER — Inpatient Hospital Stay (HOSPITAL_BASED_OUTPATIENT_CLINIC_OR_DEPARTMENT_OTHER): Payer: Medicare Other | Admitting: Oncology

## 2017-11-10 VITALS — BP 158/86 | HR 62 | Temp 97.8°F | Resp 18 | Wt 214.2 lb

## 2017-11-10 DIAGNOSIS — K222 Esophageal obstruction: Secondary | ICD-10-CM | POA: Insufficient documentation

## 2017-11-10 DIAGNOSIS — Z79899 Other long term (current) drug therapy: Secondary | ICD-10-CM | POA: Insufficient documentation

## 2017-11-10 DIAGNOSIS — I10 Essential (primary) hypertension: Secondary | ICD-10-CM | POA: Diagnosis not present

## 2017-11-10 DIAGNOSIS — D509 Iron deficiency anemia, unspecified: Secondary | ICD-10-CM | POA: Insufficient documentation

## 2017-11-10 DIAGNOSIS — R5381 Other malaise: Secondary | ICD-10-CM | POA: Insufficient documentation

## 2017-11-10 DIAGNOSIS — J449 Chronic obstructive pulmonary disease, unspecified: Secondary | ICD-10-CM | POA: Insufficient documentation

## 2017-11-10 DIAGNOSIS — R634 Abnormal weight loss: Secondary | ICD-10-CM | POA: Diagnosis not present

## 2017-11-10 DIAGNOSIS — Z801 Family history of malignant neoplasm of trachea, bronchus and lung: Secondary | ICD-10-CM | POA: Diagnosis not present

## 2017-11-10 DIAGNOSIS — Z8052 Family history of malignant neoplasm of bladder: Secondary | ICD-10-CM | POA: Insufficient documentation

## 2017-11-10 DIAGNOSIS — Z85820 Personal history of malignant melanoma of skin: Secondary | ICD-10-CM | POA: Diagnosis not present

## 2017-11-10 DIAGNOSIS — C155 Malignant neoplasm of lower third of esophagus: Secondary | ICD-10-CM | POA: Diagnosis not present

## 2017-11-10 DIAGNOSIS — Z5111 Encounter for antineoplastic chemotherapy: Secondary | ICD-10-CM

## 2017-11-10 DIAGNOSIS — Z923 Personal history of irradiation: Secondary | ICD-10-CM | POA: Insufficient documentation

## 2017-11-10 DIAGNOSIS — R1319 Other dysphagia: Secondary | ICD-10-CM | POA: Insufficient documentation

## 2017-11-10 DIAGNOSIS — G629 Polyneuropathy, unspecified: Secondary | ICD-10-CM

## 2017-11-10 DIAGNOSIS — E876 Hypokalemia: Secondary | ICD-10-CM | POA: Insufficient documentation

## 2017-11-10 DIAGNOSIS — L271 Localized skin eruption due to drugs and medicaments taken internally: Secondary | ICD-10-CM | POA: Diagnosis not present

## 2017-11-10 DIAGNOSIS — Z803 Family history of malignant neoplasm of breast: Secondary | ICD-10-CM | POA: Insufficient documentation

## 2017-11-10 DIAGNOSIS — R234 Changes in skin texture: Secondary | ICD-10-CM | POA: Insufficient documentation

## 2017-11-10 DIAGNOSIS — Z9221 Personal history of antineoplastic chemotherapy: Secondary | ICD-10-CM | POA: Insufficient documentation

## 2017-11-10 DIAGNOSIS — Z87891 Personal history of nicotine dependence: Secondary | ICD-10-CM | POA: Diagnosis not present

## 2017-11-10 DIAGNOSIS — R5383 Other fatigue: Secondary | ICD-10-CM | POA: Diagnosis not present

## 2017-11-10 DIAGNOSIS — Z8042 Family history of malignant neoplasm of prostate: Secondary | ICD-10-CM | POA: Diagnosis not present

## 2017-11-10 LAB — COMPREHENSIVE METABOLIC PANEL
ALT: 15 U/L (ref 0–44)
ANION GAP: 8 (ref 5–15)
AST: 38 U/L (ref 15–41)
Albumin: 3.3 g/dL — ABNORMAL LOW (ref 3.5–5.0)
Alkaline Phosphatase: 77 U/L (ref 38–126)
BUN: 14 mg/dL (ref 8–23)
CALCIUM: 8.8 mg/dL — AB (ref 8.9–10.3)
CO2: 26 mmol/L (ref 22–32)
Chloride: 111 mmol/L (ref 98–111)
Creatinine, Ser: 0.66 mg/dL (ref 0.44–1.00)
GFR calc non Af Amer: >60 mL/min (ref >60)
Glucose, Bld: 106 mg/dL — ABNORMAL HIGH (ref 70–99)
Potassium: 3.8 mmol/L (ref 3.5–5.1)
Sodium: 145 mmol/L (ref 135–145)
TOTAL PROTEIN: 6.1 g/dL — AB (ref 6.5–8.1)
Total Bilirubin: 0.5 mg/dL (ref 0.3–1.2)

## 2017-11-10 LAB — CBC WITH DIFFERENTIAL/PLATELET
BASOS ABS: 0 10*3/uL (ref 0–0.1)
Basophils Relative: 1 %
Eosinophils Absolute: 0.3 10*3/uL (ref 0–0.7)
Eosinophils Relative: 6 %
HEMATOCRIT: 36.2 % (ref 35.0–47.0)
HEMOGLOBIN: 12.4 g/dL (ref 12.0–16.0)
Lymphocytes Relative: 10 %
Lymphs Abs: 0.4 10*3/uL — ABNORMAL LOW (ref 1.0–3.6)
MCH: 34.3 pg — ABNORMAL HIGH (ref 26.0–34.0)
MCHC: 34.4 g/dL (ref 32.0–36.0)
MCV: 99.7 fL (ref 80.0–100.0)
MONOS PCT: 14 %
Monocytes Absolute: 0.6 10*3/uL (ref 0.2–0.9)
NEUTROS ABS: 3 10*3/uL (ref 1.4–6.5)
NEUTROS PCT: 69 %
Platelets: 164 10*3/uL (ref 150–440)
RBC: 3.63 MIL/uL — AB (ref 3.80–5.20)
RDW: 15.5 % — ABNORMAL HIGH (ref 11.5–14.5)
WBC: 4.2 10*3/uL (ref 3.6–11.0)

## 2017-11-10 LAB — IRON AND TIBC
Iron: 57 ug/dL (ref 28–170)
Saturation Ratios: 19 % (ref 10.4–31.8)
TIBC: 298 ug/dL (ref 250–450)
UIBC: 241 ug/dL

## 2017-11-10 LAB — FERRITIN: Ferritin: 44 ng/mL (ref 11–307)

## 2017-11-10 MED ORDER — SODIUM CHLORIDE 0.9% FLUSH
10.0000 mL | Freq: Once | INTRAVENOUS | Status: AC
  Administered 2017-11-10: 10 mL via INTRAVENOUS
  Filled 2017-11-10: qty 10

## 2017-11-10 MED ORDER — FLUOROURACIL CHEMO INJECTION 2.5 GM/50ML
400.0000 mg/m2 | Freq: Once | INTRAVENOUS | Status: AC
  Administered 2017-11-10: 850 mg via INTRAVENOUS
  Filled 2017-11-10: qty 17

## 2017-11-10 MED ORDER — SODIUM CHLORIDE 0.9 % IV SOLN
2400.0000 mg/m2 | INTRAVENOUS | Status: DC
  Administered 2017-11-10: 5200 mg via INTRAVENOUS
  Filled 2017-11-10: qty 100

## 2017-11-10 MED ORDER — LEUCOVORIN CALCIUM INJECTION 100 MG
20.0000 mg/m2 | Freq: Once | INTRAMUSCULAR | Status: AC
  Administered 2017-11-10: 44 mg via INTRAVENOUS
  Filled 2017-11-10: qty 2.2

## 2017-11-10 MED ORDER — DEXTROSE 5 % IV SOLN
Freq: Once | INTRAVENOUS | Status: AC
Start: 2017-11-10 — End: 2017-11-10
  Administered 2017-11-10: 10:00:00 via INTRAVENOUS
  Filled 2017-11-10: qty 1000

## 2017-11-10 MED ORDER — HEPARIN SOD (PORK) LOCK FLUSH 100 UNIT/ML IV SOLN: 500.0000 [IU] | Freq: Once | INTRAVENOUS | Status: DC

## 2017-11-10 MED ORDER — LEUCOVORIN CALCIUM INJECTION 350 MG: 400.0000 mg/m2 | Freq: Once | INTRAVENOUS | Status: DC

## 2017-11-10 NOTE — Progress Notes (Signed)
Blood return noted before, during and after Leucovorin and Adrucil push.

## 2017-11-10 NOTE — Progress Notes (Signed)
Patient here for follow up. No concerns voiced.  °

## 2017-11-10 NOTE — Progress Notes (Signed)
Hematology/Oncology Follow Up Note [facility]  Telephone:([phone] [phone]  Patient Care Team: Tonia Ghent, MD as PCP - General (Family Medicine)   Name of the patient: [name]  [id number]  1934-07-14   REASON FOR VISIT Follow up of chemotherapy management of esophageal adenocarcinoma  HISTORY OF PRESENT ILLNESS Patient is a 83 year old female who has metastatic esophageal cancer with liver involvement.  Weight loss 20 pounds for the past year prior to diagnosis.  # EGD during admission showed partially obstructive esophageal mass at the distal part of esophagus. Biopsy was taken. Pathology showed esophageal adenocarcinoma. #US biopsy of liver lesion to proof distant metastasis.  Report family history of breast cancer, but she has been having routine mammograms.  # Lives alone. She's a former smoker.  # Molecular Testing:  #[employer] Cdx: No reportable alternations with companion diagnostic (CDx) claims.  MS stable Tumor mutation burden: Cannot be determined, CCND3 amplification: Equivocal.  Amended reports on 06/04/2017, Tumor mutational burden changed from can not be determined to "TMB -low" on the re-analyzed samples.  # HER 2 negative.   Current Treatment S/p 6 cycles of FOLFOX, PET scan showed excellent partial response from both chemotherapy and radiation, result was discussed with patient.  currently on 5-FU maintenance.  S/p palliative radiation in January 2019 # Developed esophageal stricture secondary to radiation in April and got dilation via endoscopy. EGD shoed Benign-appearing esophageal stenosis. This is a result of radiation therapy.- LA Grade C radiation esophagitis. - The previously noted esophageal mass in Nov 2018 was much decreased indicating good response to radiation/chemotherapy. - Normal stomach.- Normal examined jejunum.- No specimens collected.    INTERVAL HISTORY  Patient presents for prior to cycle  9 maintenance 5-FU chemotherapy for esophageal adenocarcinoma treatment.  #Dysphagia, chronic problem for her.  Gets better after dilation however dysphagia recurs usually a week after the dilation.  She takes Protonix 40 mg twice daily.  Today she reports that dysphagia is worse, she had difficulty swallowing tomato soup. #Fatigue: Stable #Pre-existing neuropathy of bilateral toes and fingertips, stable taking Lyrica.  Not worse.  Review of systems Review of Systems  Constitutional: Positive for malaise/fatigue. Negative for chills, diaphoresis, fever and weight loss.  HENT: Negative for congestion, ear discharge, ear pain, hearing loss, nosebleeds, sinus pain and sore throat.   Eyes: Negative for double vision, photophobia, pain, discharge and redness.  Respiratory: Negative for cough, hemoptysis, sputum production, shortness of breath and wheezing.   Cardiovascular: Negative for chest pain, palpitations, orthopnea, claudication and leg swelling.  Gastrointestinal: Negative for abdominal pain, blood in stool, constipation, diarrhea, heartburn, melena, nausea and vomiting.       Dysphagia  Genitourinary: Negative for dysuria, flank pain, frequency, hematuria and urgency.  Musculoskeletal: Negative for back pain, joint pain, myalgias and neck pain.  Skin: Negative for itching and rash.  Neurological: Positive for tingling and sensory change. Negative for dizziness, tremors, focal weakness, weakness and headaches.  Endo/Heme/Allergies: Negative for environmental allergies. Does not bruise/bleed easily.  Psychiatric/Behavioral: Negative for depression, hallucinations, substance abuse and suicidal ideas. The patient is not nervous/anxious.     Allergies  Allergen Reactions  . Cefuroxime Nausea Only  . Codeine Nausea Only  . Doxycycline Other (See Comments)    Lip swelling  . Gabapentin Other (See Comments)    Swelling.   . Iohexol Itching    07-15-13 pt developed itching on fingers after  contrast given. Dr. Irish Elders looked at pt and said to put  in system as allergy. BB  . Other Other (See Comments)    Contrast Dye- caused fever, chills, awful feeling Bandages - itching, rash  . Oxycodone Other (See Comments)    nausea  . Quinolones Swelling    Lip swelling  . Synvisc [Hylan G-F 20] Swelling     Past Medical History:  Diagnosis Date  . Arthritis   . Asthma   . COPD (chronic obstructive pulmonary disease) ([place])   . Dysphonia   . Dyspnea   . Esophageal cancer ([place])   . GERD (gastroesophageal reflux disease)   . Heart murmur   . History of kidney stones   . Hypertension   . Hypokalemia 10/07/2017  . Melanoma ([place])   . OAB (overactive bladder)   . OSA on CPAP   . 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 Personal history of chemotherapy    esophageal cancer  . Personal history of radiation therapy    esophageal cancer  . Pupil asymmetry    R pupil defect  . Skin cancer   . Sleep apnea   . Spinal cord abscess      Past Surgical History:  Procedure Laterality Date  . ABDOMINAL HYSTERECTOMY    . ANTERIOR CERVICAL DECOMP/DISCECTOMY FUSION N/A 07/16/2013   Procedure: ANTERIOR CERVICAL DECOMPRESSION/DISCECTOMY FUSION mutlipleLEVELS C4-7;  Surgeon: Erline Levine, MD;  Location: [place] [facility];  Service: Neurosurgery;  Laterality: N/A;  . APPENDECTOMY    . carpel tunn Bilateral   . CATARACT EXTRACTION    . CATARACT EXTRACTION, BILATERAL    . CHOLECYSTECTOMY    . dental implant    . ESOPHAGOGASTRODUODENOSCOPY Left 03/21/2017   Procedure: ESOPHAGOGASTRODUODENOSCOPY (EGD);  Surgeon: Virgel Manifold, MD;  Location: [facility] ENDOSCOPY;  Service: Endoscopy;  Laterality: Left;  . ESOPHAGOGASTRODUODENOSCOPY (EGD) WITH PROPOFOL N/A 08/20/2017   Procedure: ESOPHAGOGASTRODUODENOSCOPY (EGD) WITH PROPOFOL;  Surgeon: Virgel Manifold, MD;  Location: [facility] ENDOSCOPY;  Service: Endoscopy;  Laterality: N/A;  . ESOPHAGOGASTRODUODENOSCOPY (EGD) WITH PROPOFOL N/A  09/19/2017   Procedure: ESOPHAGOGASTRODUODENOSCOPY (EGD) WITH PROPOFOL;  Surgeon: Virgel Manifold, MD;  Location: [facility] ENDOSCOPY;  Service: Endoscopy;  Laterality: N/A;  . ESOPHAGOGASTRODUODENOSCOPY (EGD) WITH PROPOFOL N/A 10/03/2017   Procedure: ESOPHAGOGASTRODUODENOSCOPY (EGD) WITH PROPOFOL;  Surgeon: Virgel Manifold, MD;  Location: [facility] ENDOSCOPY;  Service: Endoscopy;  Laterality: N/A;  . ESOPHAGOGASTRODUODENOSCOPY (EGD) WITH PROPOFOL N/A 10/22/2017   Procedure: ESOPHAGOGASTRODUODENOSCOPY (EGD) WITH PROPOFOL;  Surgeon: Virgel Manifold, MD;  Location: [facility] ENDOSCOPY;  Service: Endoscopy;  Laterality: N/A;  . GASTRIC BYPASS    . HIATAL HERNIA REPAIR    . MELANOMA EXCISION    . PORTA CATH INSERTION N/A 04/07/2017   Procedure: PORTA CATH INSERTION;  Surgeon: Algernon Huxley, MD;  Location: [place] [facility];  Service: Cardiovascular;  Laterality: N/A;  . POSTERIOR CERVICAL FUSION/FORAMINOTOMY N/A 07/28/2013   Procedure: Cervical four-seven  posterior cervical fusion;  Surgeon: Erline Levine, MD;  Location: [place] [facility];  Service: Neurosurgery;  Laterality: N/A;  . TONSILLECTOMY    . TOTAL KNEE ARTHROPLASTY      Social History   Socioeconomic History  . Marital status: Widowed    Spouse name: Not on file  . Number of children: 2  . Years of education: Not on file  . Highest education level: Master's degree (e.g., MA, MS, MEng, MEd, MSW, MBA)  Occupational History  . Not on file  Social Needs  . Financial resource strain: Not hard at all  . Food  insecurity:    Worry: Never true    Inability: Never true  . Transportation needs:    Medical: No    Non-medical: No  Tobacco Use  . Smoking status: Former Smoker    Packs/day: 1.00    Years: 20.00    Pack years: 20.00    Types: Cigarettes    Last attempt to quit: 1977    Years since quitting: 42.5  . Smokeless tobacco: Never Used  Substance and Sexual Activity  . Alcohol use: No  . Drug use: No  . Sexual activity: Never    Lifestyle  . Physical activity:    Days per week: 2 days    Minutes per session: 30 min  . Stress: Not at all  Relationships  . Social connections:    Talks on phone: More than three times a week    Gets together: More than three times a week    Attends religious service: More than 4 times per year    Active member of club or organization: Yes    Attends meetings of clubs or organizations: More than 4 times per year    Relationship status: Widowed  . Intimate partner violence:    Fear of current or ex partner: No    Emotionally abused: No    Physically abused: No    Forced sexual activity: No  Other Topics Concern  . Not on file  Social History Narrative   Marital Status- Widowed    Lives by herself    Employement- Retired Pharmacist, hospital   Exercise hx- Does PT     Family History  Problem Relation Age of Onset  . Breast cancer Mother 19  . Arthritis-Osteo Mother   . Breast cancer Sister 55  . Bladder Cancer Sister   . Bladder Cancer Father   . Tongue cancer Father   . Congenital heart disease Father   . Prostate cancer Brother   . Uterine cancer Maternal Aunt   . Lung cancer Paternal Uncle   . Leukemia Maternal Grandmother   . Liver cancer Paternal Grandmother   . Colon cancer Neg Hx      Current Outpatient Medications:  .  acetaminophen (TYLENOL) 325 MG tablet, Take 650 mg by mouth every 6 (six) hours as needed for moderate pain or headache. , Disp: , Rfl:  .  ALBUTEROL SULFATE HFA IN, Inhale into the lungs. , Disp: , Rfl:  .  bisacodyl (DULCOLAX) 10 MG suppository, Place rectally., Disp: , Rfl:  .  calcium carbonate (TUMS EX) 750 MG chewable tablet, Chew by mouth., Disp: , Rfl:  .  CARAFATE 1 GM/10ML suspension, TAKE 10MLS BY MOUTH FOUR TIMES DAILY, Disp: 420 mL, Rfl: 1 .  Cholecalciferol (VITAMIN D) 2000 UNITS CAPS, Take 2,000 Units by mouth daily., Disp: , Rfl:  .  cyanocobalamin 500 MCG tablet, Take 500 mcg every other day by mouth., Disp: , Rfl:  .  fluticasone  (FLONASE) 50 MCG/ACT nasal spray, Place 1-2 sprays into both nostrils daily., Disp: 16 g, Rfl: 5 .  furosemide (LASIX) 20 MG tablet, Take 1 tablet (20 mg total) by mouth daily., Disp: 90 tablet, Rfl: 3 .  lidocaine-prilocaine (EMLA) cream, Apply to affected area once, Disp: 30 g, Rfl: 3 .  loperamide (IMODIUM) 2 MG capsule, Take 1 capsule (2 mg total) by mouth See admin instructions. With onset of loose stool, take 39m followed by 277mevery 2 hours until 12 hours have passed without loose bowel movement. Maximum: 16 mg/day, Disp:  120 capsule, Rfl: 0 .  loratadine (CLARITIN) 10 MG tablet, Take 10 mg by mouth daily., Disp: , Rfl:  .  LYRICA 150 MG capsule, TAKE 1 CAPSULE BY MOUTH TWO TIMES DAILY, Disp: 180 capsule, Rfl: 1 .  metoprolol succinate (TOPROL-XL) 50 MG 24 hr tablet, TAKE 1 TABLET BY MOUTH DAILY, Disp: 90 tablet, Rfl: 1 .  oxybutynin (DITROPAN) 5 MG tablet, TAKE 1 TABLET BY MOUTH EVERY MORNING AND1/2 TABLET AT BEDTIME, Disp: 135 tablet, Rfl: 0 .  Polyethyl Glycol-Propyl Glycol (SYSTANE OP), Apply 1 drop to eye daily as needed (dry eyes)., Disp: , Rfl:  .  potassium chloride (KLOR-CON) 20 MEQ packet, Take 20 mEq by mouth as needed (Daily as needed with fluid pill)., Disp: 30 packet, Rfl: 1 .  pyridOXINE (VITAMIN B-6) 100 MG tablet, Take 1 tablet (100 mg total) by mouth daily., Disp: 30 tablet, Rfl: 3 .  senna-docusate (SENOKOT-S) 8.6-50 MG tablet, Take 1 tablet at bedtime as needed by mouth for mild constipation., Disp: , Rfl:  .  traMADol (ULTRAM) 50 MG tablet, TAKE 1 TABLET BY MOUTH 3 TIMES DAILY AS NEEDED FOR PAIN, Disp: 30 tablet, Rfl: 0 .  traZODone (DESYREL) 50 MG tablet, TAKE 1/2 TABLET BY MOUTH AT BEDTIME IF NEEDED FOR SLEEP, Disp: 45 tablet, Rfl: 3 .  Diclofenac Sodium 1 % CREA, Apply to left arm as needed for pain three times daily (Patient not taking: Reported on 10/07/2017), Disp: 120 g, Rfl: 0 .  hydrocortisone cream 1 %, Apply 1 application topically daily as needed for itching. ,  Disp: , Rfl:  .  ondansetron (ZOFRAN) 8 MG tablet, Take 1 tablet (8 mg total) by mouth 2 (two) times daily as needed for refractory nausea / vomiting. Start on day 3 after chemotherapy. (Patient not taking: Reported on 11/10/2017), Disp: 30 tablet, Rfl: 1 .  pantoprazole (PROTONIX) 40 MG tablet, Take 1 tablet (40 mg total) by mouth 2 (two) times daily before a meal., Disp: 60 tablet, Rfl: 1 .  pantoprazole (PROTONIX) 40 MG tablet, Take 40 mg by mouth daily., Disp: , Rfl:  .  Pediatric Multiple Vitamins (FLINTSTONES MULTIVITAMIN PO), Take by mouth., Disp: , Rfl:  .  prochlorperazine (COMPAZINE) 10 MG tablet, Take 1 tablet (10 mg total) by mouth every 6 (six) hours as needed (Nausea or vomiting). (Patient not taking: Reported on 11/10/2017), Disp: 30 tablet, Rfl: 1  Physical exam:  Vitals:   11/10/17 0902  BP: (!) 158/86  Pulse: 62  Resp: 18  Temp: 97.8 F (36.6 C)  TempSrc: Tympanic  Weight: 214 lb 3.2 oz (97.2 kg)  ECOG 1 Physical Exam  Constitutional: She is oriented to person, place, and time and well-developed, well-nourished, and in no distress. No distress.  HENT:  Head: Normocephalic and atraumatic.  Nose: Nose normal.  Mouth/Throat: Oropharynx is clear and moist. No oropharyngeal exudate.  Eyes: Pupils are equal, round, and reactive to light. Conjunctivae and EOM are normal. Left eye exhibits no discharge. No scleral icterus.  Neck: Normal range of motion. Neck supple. No JVD present.  Cardiovascular: Normal rate and regular rhythm.  Murmur heard. Pulmonary/Chest: Effort normal and breath sounds normal. No respiratory distress. She has no wheezes. She has no rales. She exhibits no tenderness.  Abdominal: Soft. Bowel sounds are normal. She exhibits no distension and no mass. There is no tenderness. There is no rebound and no guarding.  Musculoskeletal: Normal range of motion. She exhibits no edema, tenderness or deformity.  Lymphadenopathy:  She has no cervical adenopathy.    Neurological: She is alert and oriented to person, place, and time. No cranial nerve deficit. She exhibits normal muscle tone. Coordination normal.  Skin: Skin is warm and dry. No rash noted. She is not diaphoretic. No erythema.  Psychiatric: Affect and judgment normal.    CMP Latest Ref Rng & Units 11/10/2017  Glucose 70 - 99 mg/dL 106(H)  BUN 8 - 23 mg/dL 14  Creatinine 0.44 - 1.00 mg/dL 0.66  Sodium 135 - 145 mmol/L 145  Potassium 3.5 - 5.1 mmol/L 3.8  Chloride 98 - 111 mmol/L 111  CO2 22 - 32 mmol/L 26  Calcium 8.9 - 10.3 mg/dL 8.8(L)  Total Protein 6.5 - 8.1 g/dL 6.1(L)  Total Bilirubin 0.3 - 1.2 mg/dL 0.5  Alkaline Phos 38 - 126 U/L 77  AST 15 - 41 U/L 38  ALT 0 - 44 U/L 15   CBC Latest Ref Rng & Units 11/10/2017  WBC 3.6 - 11.0 K/uL 4.2  Hemoglobin 12.0 - 16.0 g/dL 12.4  Hematocrit 35.0 - 47.0 % 36.2  Platelets 150 - 440 K/uL 164     Assessment and plan  Cancer Staging Malignant neoplasm of lower third of esophagus (HCC) Staging form: Esophagus - Adenocarcinoma, AJCC 8th Edition - Clinical stage from 04/10/2017: Stage IVB (cTX, cNX, pM1) - Signed by Earlie Server, MD on 04/10/2017  1. Malignant neoplasm of lower third of esophagus (HCC)   2. Encounter for antineoplastic chemotherapy   3. Esophageal stricture   4. Neuropathy    #Esophageal cancer, recent PET scan showed complete remission.   Labs reviewed, counts acceptable.  Proceed with cycle 9 maintenance 5-FU bolus and 48-hour infusion.  She tolerates chemotherapy well.  #Esophageal stricture: Recurrent issue for patient.  Secondary to radiation.  Status post dilation by endoscopy for multiple times.  Continue Protonix 40 mg daily.  We will touch base with Dr. Darene Lamer. ? Need for Stent placement  # Iron deficiency anemia: Iron panel reviewed.  Fairly stable.  Continue monitor.  # Hypokalemia: due to lasix use.  Continue potassium supplements.  Today level stable at 3.8.  # Pre-existing peripheral neuropathy: Stable.   Continue Lyrica and B6 supplementation. # Thrombocytopenia: Resolved.  Follow up in 2 weeks for next cycle of maintenance 5-FU.Marland Kitchen  Patient knows to call if any concerns or questions.  Orders Placed This Encounter  Procedures  . CEA    Standing Status:   Future    Standing Expiration Date:   11/11/2018    Earlie Server, MD, PhD Hematology Oncology [facility] Pager- [phone] 11/10/17

## 2017-11-11 ENCOUNTER — Telehealth: Payer: Self-pay | Admitting: Gastroenterology

## 2017-11-11 ENCOUNTER — Ambulatory Visit: Payer: Medicare Other | Admitting: Gastroenterology

## 2017-11-11 ENCOUNTER — Encounter: Payer: Self-pay | Admitting: Gastroenterology

## 2017-11-11 VITALS — BP 122/69 | HR 85 | Wt 214.2 lb

## 2017-11-11 DIAGNOSIS — K222 Esophageal obstruction: Secondary | ICD-10-CM | POA: Diagnosis not present

## 2017-11-11 NOTE — Telephone Encounter (Signed)
[name] called and can schedule the EGD for July 23rd.

## 2017-11-12 ENCOUNTER — Inpatient Hospital Stay: Payer: Medicare Other

## 2017-11-12 DIAGNOSIS — C155 Malignant neoplasm of lower third of esophagus: Secondary | ICD-10-CM

## 2017-11-12 MED ORDER — RANITIDINE HCL 150 MG/10ML PO SYRP: 150.0000 mg | ORAL_SOLUTION | Freq: Every day | ORAL | 3 refills | Status: DC

## 2017-11-12 MED ORDER — HEPARIN SOD (PORK) LOCK FLUSH 100 UNIT/ML IV SOLN
500.0000 [IU] | Freq: Once | INTRAVENOUS | Status: AC | PRN
  Administered 2017-11-12: 500 [IU]
  Filled 2017-11-12: qty 5

## 2017-11-12 MED ORDER — SODIUM CHLORIDE 0.9% FLUSH
10.0000 mL | INTRAVENOUS | Status: DC | PRN
  Administered 2017-11-12: 10 mL
  Filled 2017-11-12: qty 10

## 2017-11-12 NOTE — Progress Notes (Signed)
[name], MD [address]  [place]  [place], [place] [address]  Main: [phone]  Fax: [phone]   Primary Care Physician: Tonia Ghent, MD  Primary Gastroenterologist:  Dr. [name]  Chief Complaint  Patient presents with  . Follow-up    EGD    HPI: [name] is a 83 y.o. female here for follow-up of dysphagia from radiation induced esophageal stricture.  History of esophageal cancer, with hepatic mets, on chemotherapy, currently in remission.  So far, patient has undergone 4 EGDs with dilation of her radiation stricture April 2019, Sep 19 2017, Oct 03 2016, October 22 2017  Symptoms are better for a week after the dilation, but then recur However, she is able to tolerate solid food, but has difficulty with breads. States was able to eat roast beef that was tender without difficulty.  Tolerating liquids without difficulty Has some problems with pills in the morning and has started to cut them in half.  Is taking PPI twice daily, and Carafate   Current Outpatient Medications  Medication Sig Dispense Refill  . acetaminophen (TYLENOL) 325 MG tablet Take 650 mg by mouth every 6 (six) hours as needed for moderate pain or headache.     . ALBUTEROL SULFATE HFA IN Inhale into the lungs.     . bisacodyl (DULCOLAX) 10 MG suppository Place rectally.    . calcium carbonate (TUMS EX) 750 MG chewable tablet Chew by mouth.    Marland Kitchen CARAFATE 1 GM/10ML suspension TAKE 10MLS BY MOUTH FOUR TIMES DAILY 420 mL 1  . Cholecalciferol (VITAMIN D) 2000 UNITS CAPS Take 2,000 Units by mouth daily.    . cyanocobalamin 500 MCG tablet Take 500 mcg every other day by mouth.    . Diclofenac Sodium 1 % CREA Apply to left arm as needed for pain three times daily 120 g 0  . fluticasone (FLONASE) 50 MCG/ACT nasal spray Place 1-2 sprays into both nostrils daily. 16 g 5  . furosemide (LASIX) 20 MG tablet Take 1 tablet (20 mg total) by mouth daily. 90 tablet 3  .  hydrocortisone cream 1 % Apply 1 application topically daily as needed for itching.     . lidocaine-prilocaine (EMLA) cream Apply to affected area once 30 g 3  . loperamide (IMODIUM) 2 MG capsule Take 1 capsule (2 mg total) by mouth See admin instructions. With onset of loose stool, take 4mg  followed by 2mg  every 2 hours until 12 hours have passed without loose bowel movement. Maximum: 16 mg/day 120 capsule 0  . loratadine (CLARITIN) 10 MG tablet Take 10 mg by mouth daily.    Marland Kitchen LYRICA 150 MG capsule TAKE 1 CAPSULE BY MOUTH TWO TIMES DAILY 180 capsule 1  . metoprolol succinate (TOPROL-XL) 50 MG 24 hr tablet TAKE 1 TABLET BY MOUTH DAILY 90 tablet 1  . ondansetron (ZOFRAN) 8 MG tablet Take 1 tablet (8 mg total) by mouth 2 (two) times daily as needed for refractory nausea / vomiting. Start on day 3 after chemotherapy. 30 tablet 1  . oxybutynin (DITROPAN) 5 MG tablet TAKE 1 TABLET BY MOUTH EVERY MORNING AND1/2 TABLET AT BEDTIME 135 tablet 0  . pantoprazole (PROTONIX) 40 MG tablet Take 40 mg by mouth daily.    . Pediatric Multiple Vitamins (FLINTSTONES MULTIVITAMIN PO) Take by mouth.    Vladimir Faster Glycol-Propyl Glycol (SYSTANE OP) Apply 1 drop to eye daily as needed (dry eyes).    . potassium chloride (KLOR-CON) 20 MEQ packet Take  20 mEq by mouth as needed (Daily as needed with fluid pill). 30 packet 1  . prochlorperazine (COMPAZINE) 10 MG tablet Take 1 tablet (10 mg total) by mouth every 6 (six) hours as needed (Nausea or vomiting). 30 tablet 1  . pyridOXINE (VITAMIN B-6) 100 MG tablet Take 1 tablet (100 mg total) by mouth daily. 30 tablet 3  . senna-docusate (SENOKOT-S) 8.6-50 MG tablet Take 1 tablet at bedtime as needed by mouth for mild constipation.    . traMADol (ULTRAM) 50 MG tablet TAKE 1 TABLET BY MOUTH 3 TIMES DAILY AS NEEDED FOR PAIN 30 tablet 0  . traZODone (DESYREL) 50 MG tablet TAKE 1/2 TABLET BY MOUTH AT BEDTIME IF NEEDED FOR SLEEP 45 tablet 3  . pantoprazole (PROTONIX) 40 MG tablet Take 1  tablet (40 mg total) by mouth 2 (two) times daily before a meal. 60 tablet 1   No current facility-administered medications for this visit.    Facility-Administered Medications Ordered in Other Visits  Medication Dose Route Frequency Provider Last Rate Last Dose  . sodium chloride flush (NS) 0.9 % injection 10 mL  10 mL Intracatheter PRN Earlie Server, MD   10 mL at 11/12/17 1140    Allergies as of 11/11/2017 - Review Complete 11/11/2017  Allergen Reaction Noted  . Cefuroxime Nausea Only 07/11/2015  . Codeine Nausea Only 01/22/2011  . Doxycycline Other (See Comments) 12/03/2013  . Gabapentin Other (See Comments) 12/19/2016  . Iohexol Itching 07/15/2013  . Other Other (See Comments) 12/03/2013  . Oxycodone Other (See Comments) 12/19/2016  . Quinolones Swelling 12/03/2013  . Synvisc [hylan g-f 20] Swelling 01/22/2011    ROS:  General: Negative for anorexia, weight loss, fever, chills, fatigue, weakness. ENT: Negative for hoarseness, difficulty swallowing , nasal congestion. CV: Negative for chest pain, angina, palpitations, dyspnea on exertion, peripheral edema.  Respiratory: Negative for dyspnea at rest, dyspnea on exertion, cough, sputum, wheezing.  GI: See history of present illness. GU:  Negative for dysuria, hematuria, urinary incontinence, urinary frequency, nocturnal urination.  Endo: Negative for unusual weight change.    Physical Examination:   BP 122/69   Pulse 85   Wt 214 lb 3.2 oz (97.2 kg)   BMI 38.55 kg/m   General: Well-nourished, well-developed in no acute distress.  Eyes: No icterus. Conjunctivae pink. Mouth: Oropharyngeal mucosa moist and pink , no lesions erythema or exudate. Neck: Supple, Trachea midline Abdomen: Bowel sounds are normal, nontender, nondistended, no hepatosplenomegaly or masses, no abdominal bruits or hernia , no rebound or guarding.   Extremities: No lower extremity edema. No clubbing or deformities. Neuro: Alert and oriented x 3.  Grossly  intact. Skin: Warm and dry, no jaundice.   Psych: Alert and cooperative, normal mood and affect.   Labs: CMP     Component Value Date/Time   NA 145 11/10/2017 0852   NA 138 07/13/2013 0609   K 3.8 11/10/2017 0852   K 4.1 07/13/2013 0609   CL 111 11/10/2017 0852   CL 104 07/13/2013 0609   CO2 26 11/10/2017 0852   CO2 29 07/13/2013 0609   GLUCOSE 106 (H) 11/10/2017 0852   GLUCOSE 115 (H) 07/13/2013 0609   BUN 14 11/10/2017 0852   BUN 15 07/13/2013 0609   CREATININE 0.66 11/10/2017 0852   CREATININE 0.67 07/13/2013 0609   CALCIUM 8.8 (L) 11/10/2017 0852   CALCIUM 8.3 (L) 07/13/2013 0609   PROT 6.1 (L) 11/10/2017 0852   ALBUMIN 3.3 (L) 11/10/2017 0852   AST 38 11/10/2017  0852   ALT 15 11/10/2017 0852   ALKPHOS 77 11/10/2017 0852   BILITOT 0.5 11/10/2017 0852   GFRNONAA >60 11/10/2017 0852   GFRNONAA >60 07/13/2013 0609   GFRAA >60 11/10/2017 0852   GFRAA >60 07/13/2013 0609   Lab Results  Component Value Date   WBC 4.2 11/10/2017   HGB 12.4 11/10/2017   HCT 36.2 11/10/2017   MCV 99.7 11/10/2017   PLT 164 11/10/2017    Imaging Studies: Nm Pet Image Restag (ps) Skull Base To Thigh  Result Date: 10/15/2017 CLINICAL DATA:  Subsequent treatment strategy for esophageal cancer. EXAM: NUCLEAR MEDICINE PET SKULL BASE TO THIGH TECHNIQUE: 11.38 mCi F-18 FDG was injected intravenously. Full-ring PET imaging was performed from the skull base to thigh after the radiotracer. CT data was obtained and used for attenuation correction and anatomic localization. Fasting blood glucose: 100 mg/dl COMPARISON:  07/08/2017 FINDINGS: Mediastinal blood pool activity: SUV max  2.88 NECK: No hypermetabolic lymph nodes in the neck. Incidental CT findings: none CHEST: No hypermetabolic supraclavicular, axillary, mediastinal or hilar lymph nodes. No hypermetabolic pulmonary nodules. Incidental CT findings: Aortic atherosclerosis. Calcifications in the LAD coronary artery. ABDOMEN/PELVIS: Interval  resolution of previous hypermetabolic liver lesions. No hypermetabolic lymph nodes within the abdomen or pelvis. Incidental CT findings: Postoperative changes involving the stomach noted. Aortic atherosclerosis without aneurysm. Large stone within the inferior pole of left kidney measures 1.7 cm. SKELETON: No focal hypermetabolic activity to suggest skeletal metastasis. Incidental CT findings: none IMPRESSION: 1. Continued interval improvement in multifocal liver metastasis. At this time no abnormal areas of hypermetabolism are identified within the chest, abdomen or pelvis to suggest metabolically active tumor. 2. Aortic atherosclerosis and coronary artery calcifications. Aortic Atherosclerosis (ICD10-I70.0). 3. Right renal calculus. Electronically Signed   By: Kerby Moors M.D.   On: 10/15/2017 11:29    Assessment and Plan:   ALLI JASMER is a 83 y.o. y/o female here for follow-up of dysphagia from radiation induced esophageal stricture, with history of esophageal adenocarcinoma with hepatic mets, currently in remission  She seems to be tolerating more solid foods for a longer period of time since last dilation, however still having some dysphasia We will thus plan for repeat EGD with dilation, and Kenalog/steroid injection at the site, to allow for further time between dilations  Esophageal stent procedure was discussed with her as well, however, given her history of chemotherapy and radiation, and peptic stricture, the adverse effect associated with esophageal stents, including ulceration, and GERD is likely to affect her quality of life adversely.  Therefore, we will continue with EGD with dilations at this time, and patient is agreeable with this plan.  If were not able to make headway with dilations, and especially, with dilation with steroid injection on the next procedure, can consider referral to [facility], to discuss esophageal stent procedure, would in fact be beneficial for her.  We will  also add Zantac at bedtime, as patient states, most of her difficulty swallowing occurs early in the morning with breakfast, and she does not have much problems with eating throughout the day.  Benefits of continued PPI therapy in this patient with distal esophageal stricture, outweigh risks at this time  Dr [name]

## 2017-11-12 NOTE — Telephone Encounter (Signed)
Pt aware I had got her message and was given the phone number again to contact [facility] the day before her procedure on 12/02/2017. She is aware of the prep.

## 2017-11-17 ENCOUNTER — Other Ambulatory Visit: Payer: Self-pay

## 2017-11-17 ENCOUNTER — Telehealth: Payer: Self-pay

## 2017-11-17 DIAGNOSIS — R1310 Dysphagia, unspecified: Secondary | ICD-10-CM

## 2017-11-17 DIAGNOSIS — K222 Esophageal obstruction: Secondary | ICD-10-CM

## 2017-11-17 DIAGNOSIS — R1319 Other dysphagia: Secondary | ICD-10-CM

## 2017-11-17 NOTE — Telephone Encounter (Signed)
Pt calls and states for the last 2-3 days, it has been very difficult to swallow. No solid foods, liquids only.  Is able to take medications with applesauce, which is still difficult. She is spitting out mucous and liquids, but keeping some liquids down.  I contacted Dr. Bonna Gains and she requested that EGD be done tomorrow at [facility] (11:00am-approx), someone from the hospital to contact pt. Regarding arrival time. Pt notified and is working on getting someone to take her and stay with her but does not think this will be an issue.

## 2017-11-18 ENCOUNTER — Ambulatory Visit: Payer: Medicare Other | Admitting: Anesthesiology

## 2017-11-18 ENCOUNTER — Encounter
Admission: RE | Disposition: A | Discharge status: Home / Self Care | Payer: Self-pay | Source: Ambulatory Visit | Attending: Gastroenterology

## 2017-11-18 ENCOUNTER — Encounter: Payer: Self-pay | Admitting: *Deleted

## 2017-11-18 ENCOUNTER — Ambulatory Visit
Admission: RE | Admit: 2017-11-18 | Discharge: 2017-11-18 | Disposition: A | Discharge status: Home / Self Care | Payer: Medicare Other | Source: Ambulatory Visit | Attending: Gastroenterology | Admitting: Gastroenterology

## 2017-11-18 DIAGNOSIS — Z96659 Presence of unspecified artificial knee joint: Secondary | ICD-10-CM | POA: Insufficient documentation

## 2017-11-18 DIAGNOSIS — Z923 Personal history of irradiation: Secondary | ICD-10-CM | POA: Insufficient documentation

## 2017-11-18 DIAGNOSIS — Z9221 Personal history of antineoplastic chemotherapy: Secondary | ICD-10-CM | POA: Diagnosis not present

## 2017-11-18 DIAGNOSIS — Z881 Allergy status to other antibiotic agents status: Secondary | ICD-10-CM | POA: Diagnosis not present

## 2017-11-18 DIAGNOSIS — R1310 Dysphagia, unspecified: Secondary | ICD-10-CM

## 2017-11-18 DIAGNOSIS — Z8501 Personal history of malignant neoplasm of esophagus: Secondary | ICD-10-CM | POA: Diagnosis not present

## 2017-11-18 DIAGNOSIS — K219 Gastro-esophageal reflux disease without esophagitis: Secondary | ICD-10-CM | POA: Diagnosis not present

## 2017-11-18 DIAGNOSIS — I739 Peripheral vascular disease, unspecified: Secondary | ICD-10-CM | POA: Diagnosis not present

## 2017-11-18 DIAGNOSIS — Z9989 Dependence on other enabling machines and devices: Secondary | ICD-10-CM | POA: Diagnosis not present

## 2017-11-18 DIAGNOSIS — Z87891 Personal history of nicotine dependence: Secondary | ICD-10-CM | POA: Diagnosis not present

## 2017-11-18 DIAGNOSIS — K257 Chronic gastric ulcer without hemorrhage or perforation: Secondary | ICD-10-CM

## 2017-11-18 DIAGNOSIS — I2510 Atherosclerotic heart disease of native coronary artery without angina pectoris: Secondary | ICD-10-CM | POA: Insufficient documentation

## 2017-11-18 DIAGNOSIS — J449 Chronic obstructive pulmonary disease, unspecified: Secondary | ICD-10-CM | POA: Insufficient documentation

## 2017-11-18 DIAGNOSIS — Z85820 Personal history of malignant melanoma of skin: Secondary | ICD-10-CM | POA: Insufficient documentation

## 2017-11-18 DIAGNOSIS — T18198A Other foreign object in esophagus causing other injury, initial encounter: Secondary | ICD-10-CM

## 2017-11-18 DIAGNOSIS — I10 Essential (primary) hypertension: Secondary | ICD-10-CM | POA: Insufficient documentation

## 2017-11-18 DIAGNOSIS — R1319 Other dysphagia: Secondary | ICD-10-CM

## 2017-11-18 DIAGNOSIS — Z79899 Other long term (current) drug therapy: Secondary | ICD-10-CM | POA: Diagnosis not present

## 2017-11-18 DIAGNOSIS — Z885 Allergy status to narcotic agent status: Secondary | ICD-10-CM | POA: Diagnosis not present

## 2017-11-18 DIAGNOSIS — G4733 Obstructive sleep apnea (adult) (pediatric): Secondary | ICD-10-CM | POA: Diagnosis not present

## 2017-11-18 DIAGNOSIS — X58XXXA Exposure to other specified factors, initial encounter: Secondary | ICD-10-CM | POA: Insufficient documentation

## 2017-11-18 DIAGNOSIS — M1990 Unspecified osteoarthritis, unspecified site: Secondary | ICD-10-CM | POA: Diagnosis not present

## 2017-11-18 DIAGNOSIS — R011 Cardiac murmur, unspecified: Secondary | ICD-10-CM | POA: Insufficient documentation

## 2017-11-18 DIAGNOSIS — K222 Esophageal obstruction: Secondary | ICD-10-CM | POA: Diagnosis not present

## 2017-11-18 DIAGNOSIS — Z888 Allergy status to other drugs, medicaments and biological substances status: Secondary | ICD-10-CM | POA: Diagnosis not present

## 2017-11-18 HISTORY — PX: ESOPHAGOGASTRODUODENOSCOPY (EGD) WITH PROPOFOL: SHX5813

## 2017-11-18 SURGERY — ESOPHAGOGASTRODUODENOSCOPY (EGD) WITH PROPOFOL
Anesthesia: General

## 2017-11-18 MED ORDER — LIDOCAINE HCL (CARDIAC) PF 100 MG/5ML IV SOSY
PREFILLED_SYRINGE | INTRAVENOUS | Status: DC | PRN
  Administered 2017-11-18: 50 mg via INTRAVENOUS

## 2017-11-18 MED ORDER — PROPOFOL 500 MG/50ML IV EMUL
INTRAVENOUS | Status: DC | PRN
  Administered 2017-11-18: 185 ug/kg/min via INTRAVENOUS

## 2017-11-18 MED ORDER — TRIAMCINOLONE ACETONIDE 40 MG/ML IJ SUSP
40.0000 mg | Freq: Once | INTRAMUSCULAR | Status: AC
  Administered 2017-11-18: 40 mg
  Filled 2017-11-18: qty 1

## 2017-11-18 MED ORDER — SODIUM CHLORIDE 0.9 % IV SOLN
INTRAVENOUS | Status: DC
  Administered 2017-11-18: 11:00:00 via INTRAVENOUS

## 2017-11-18 MED ORDER — PROPOFOL 500 MG/50ML IV EMUL
INTRAVENOUS | Status: AC
  Filled 2017-11-18: qty 50

## 2017-11-18 MED ORDER — PROPOFOL 10 MG/ML IV BOLUS
INTRAVENOUS | Status: DC | PRN
  Administered 2017-11-18: 60 mg via INTRAVENOUS

## 2017-11-18 NOTE — H&P (Signed)
[name] Antigua, MD [address] [place], [place], [place], [address] [address], [place], [place], [place], [address] Phone: [phone]  Fax: [phone]  Primary Care Physician:  Tonia Ghent, MD   Pre-Procedure History & Physical: HPI:  [name] is a 83 y.o. female is here for an EGD.   Past Medical History:  Diagnosis Date  . Arthritis   . Asthma   . COPD (chronic obstructive pulmonary disease) ([place])   . Dysphonia   . Dyspnea   . Esophageal cancer ([place])   . GERD (gastroesophageal reflux disease)   . Heart murmur   . History of kidney stones   . Hypertension   . Hypokalemia 10/07/2017  . Melanoma ([place])   . OAB (overactive bladder)   . OSA on CPAP   . 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 Personal history of chemotherapy    esophageal cancer  . Personal history of radiation therapy    esophageal cancer  . Pupil asymmetry    R pupil defect  . Skin cancer   . Sleep apnea   . Spinal cord abscess     Past Surgical History:  Procedure Laterality Date  . ABDOMINAL HYSTERECTOMY    . ANTERIOR CERVICAL DECOMP/DISCECTOMY FUSION N/A 07/16/2013   Procedure: ANTERIOR CERVICAL DECOMPRESSION/DISCECTOMY FUSION mutlipleLEVELS C4-7;  Surgeon: Erline Levine, MD;  Location: [facility]URO ORS;  Service: Neurosurgery;  Laterality: N/A;  . APPENDECTOMY    . carpel tunn Bilateral   . CATARACT EXTRACTION    . CATARACT EXTRACTION, BILATERAL    . CHOLECYSTECTOMY    . dental implant    . ESOPHAGOGASTRODUODENOSCOPY Left 03/21/2017   Procedure: ESOPHAGOGASTRODUODENOSCOPY (EGD);  Surgeon: Virgel Manifold, MD;  Location: [facility] ENDOSCOPY;  Service: Endoscopy;  Laterality: Left;  . ESOPHAGOGASTRODUODENOSCOPY (EGD) WITH PROPOFOL N/A 08/20/2017   Procedure: ESOPHAGOGASTRODUODENOSCOPY (EGD) WITH PROPOFOL;  Surgeon: Virgel Manifold, MD;  Location: [facility] ENDOSCOPY;  Service: Endoscopy;  Laterality: N/A;  . ESOPHAGOGASTRODUODENOSCOPY (EGD) WITH PROPOFOL N/A  09/19/2017   Procedure: ESOPHAGOGASTRODUODENOSCOPY (EGD) WITH PROPOFOL;  Surgeon: Virgel Manifold, MD;  Location: [facility] ENDOSCOPY;  Service: Endoscopy;  Laterality: N/A;  . ESOPHAGOGASTRODUODENOSCOPY (EGD) WITH PROPOFOL N/A 10/03/2017   Procedure: ESOPHAGOGASTRODUODENOSCOPY (EGD) WITH PROPOFOL;  Surgeon: Virgel Manifold, MD;  Location: [facility] ENDOSCOPY;  Service: Endoscopy;  Laterality: N/A;  . ESOPHAGOGASTRODUODENOSCOPY (EGD) WITH PROPOFOL N/A 10/22/2017   Procedure: ESOPHAGOGASTRODUODENOSCOPY (EGD) WITH PROPOFOL;  Surgeon: Virgel Manifold, MD;  Location: [facility] ENDOSCOPY;  Service: Endoscopy;  Laterality: N/A;  . EYE SURGERY    . GASTRIC BYPASS    . HIATAL HERNIA REPAIR    . JOINT REPLACEMENT    . MELANOMA EXCISION    . PORTA CATH INSERTION N/A 04/07/2017   Procedure: PORTA CATH INSERTION;  Surgeon: Algernon Huxley, MD;  Location: [facility] CV LAB;  Service: Cardiovascular;  Laterality: N/A;  . POSTERIOR CERVICAL FUSION/FORAMINOTOMY N/A 07/28/2013   Procedure: Cervical four-seven  posterior cervical fusion;  Surgeon: Erline Levine, MD;  Location: [facility] NEURO ORS;  Service: Neurosurgery;  Laterality: N/A;  . TONSILLECTOMY    . TOTAL KNEE ARTHROPLASTY      Prior to Admission medications   Medication Sig Start Date End Date Taking? Authorizing Provider  acetaminophen (TYLENOL) 325 MG tablet Take 650 mg by mouth every 6 (six) hours as needed for moderate pain or headache.    Yes [provider]  ALBUTEROL SULFATE HFA IN Inhale into the lungs.  07/07/15  Yes  [provider]  calcium carbonate (TUMS EX) 750 MG chewable tablet Chew by mouth.   Yes [provider]  CARAFATE 1 GM/10ML suspension TAKE 10MLS BY MOUTH FOUR TIMES DAILY 10/28/17  Yes Virgel Manifold, MD  Cholecalciferol (VITAMIN D) 2000 UNITS CAPS Take 2,000 Units by mouth daily.   Yes [provider]  cyanocobalamin 500 MCG tablet Take 500 mcg every other day by mouth.   Yes [provider]  Diclofenac Sodium 1 % CREA Apply to left arm as needed for pain three times daily 04/23/17  Yes Earlie Server, MD  fluticasone [facility]) 50 MCG/ACT nasal spray Place 1-2 sprays into both nostrils daily. 06/13/17  Yes Tonia Ghent, MD  furosemide (LASIX) 20 MG tablet Take 1 tablet (20 mg total) by mouth daily. 08/14/17  Yes Minna Merritts, MD  hydrocortisone cream 1 % Apply 1 application topically daily as needed for itching.    Yes [provider]  loratadine (CLARITIN) 10 MG tablet Take 10 mg by mouth daily.   Yes [provider]  LYRICA 150 MG capsule TAKE 1 CAPSULE BY MOUTH TWO TIMES DAILY 01/06/17  Yes Tonia Ghent, MD  metoprolol succinate (TOPROL-XL) 50 MG 24 hr tablet TAKE 1 TABLET BY MOUTH DAILY 10/28/17  Yes Tonia Ghent, MD  ondansetron (ZOFRAN) 8 MG tablet Take 1 tablet (8 mg total) by mouth 2 (two) times daily as needed for refractory nausea / vomiting. Start on day 3 after chemotherapy. 06/30/17  Yes Earlie Server, MD  oxybutynin (DITROPAN) 5 MG tablet TAKE 1 TABLET BY MOUTH EVERY MORNING AND1/2 TABLET AT BEDTIME 10/08/17  Yes Tonia Ghent, MD  pantoprazole (PROTONIX) 40 MG tablet Take 40 mg by mouth daily. 10/28/17  Yes [provider]  Pediatric Multiple Vitamins (FLINTSTONES MULTIVITAMIN PO) Take by mouth.   Yes [provider]  Polyethyl Glycol-Propyl Glycol (SYSTANE OP) Apply 1 drop to eye daily as needed (dry eyes).   Yes [provider]  potassium chloride (KLOR-CON) 20 MEQ packet Take 20 mEq by mouth as needed (Daily as needed with fluid pill). 08/14/17  Yes Gollan, Kathlene November, MD  prochlorperazine (COMPAZINE) 10 MG tablet Take 1 tablet (10 mg total) by mouth every 6 (six) hours as needed (Nausea or vomiting). 06/30/17  Yes Earlie Server, MD  pyridOXINE (VITAMIN B-6) 100 MG tablet Take 1 tablet (100 mg total) by mouth daily. 04/28/17  Yes Earlie Server, MD  senna-docusate (SENOKOT-S) 8.6-50 MG tablet Take 1 tablet at bedtime as  needed by mouth for mild constipation. 03/22/17  Yes Gouru, Illene Silver, MD  traMADol (ULTRAM) 50 MG tablet TAKE 1 TABLET BY MOUTH 3 TIMES DAILY AS NEEDED FOR PAIN 10/10/17  Yes Earlie Server, MD  traZODone (DESYREL) 50 MG tablet TAKE 1/2 TABLET BY MOUTH AT BEDTIME IF NEEDED FOR SLEEP 04/17/17  Yes Tonia Ghent, MD  bisacodyl (DULCOLAX) 10 MG suppository Place rectally.    [provider]  lidocaine-prilocaine (EMLA) cream Apply to affected area once 04/10/17   Earlie Server, MD  loperamide (IMODIUM) 2 MG capsule Take 1 capsule (2 mg total) by mouth See admin instructions. With onset of loose stool, take 4mg  followed by 2mg  every 2 hours until 12 hours have passed without loose bowel movement. Maximum: 16 mg/day Patient not taking: Reported on 11/18/2017 04/14/17   Earlie Server, MD  pantoprazole (PROTONIX) 40 MG tablet Take 1 tablet (40 mg total) by mouth 2 (two) times daily before a meal. 09/09/17 10/09/17  Earlie Server, MD  ranitidine (ZANTAC) 150 MG/10ML syrup Take 10 mLs (150 mg total) by mouth at bedtime. Patient not taking: Reported on 11/18/2017 11/12/17 12/12/17  Virgel Manifold, MD    Allergies as of 11/17/2017 - Review Complete 11/11/2017  Allergen Reaction Noted  . Cefuroxime Nausea Only 07/11/2015  . Codeine Nausea Only 01/22/2011  . Doxycycline Other (See Comments) 12/03/2013  . Gabapentin Other (See Comments) 12/19/2016  . Iohexol Itching 07/15/2013  . Other Other (See Comments) 12/03/2013  . Oxycodone Other (See Comments) 12/19/2016  . Quinolones Swelling 12/03/2013  . Synvisc [hylan g-f 20] Swelling 01/22/2011    Family History  Problem Relation Age of Onset  . Breast cancer Mother 69  . Arthritis-Osteo Mother   . Breast cancer Sister 73  . Bladder Cancer Sister   . Bladder Cancer Father   . Tongue cancer Father   . Congenital heart disease Father   . Prostate cancer Brother   . Uterine cancer Maternal Aunt   . Lung cancer Paternal Uncle   . Leukemia Maternal Grandmother   . Liver  cancer Paternal Grandmother   . Colon cancer Neg Hx     Social History   Socioeconomic History  . Marital status: Widowed    Spouse name: Not on file  . Number of children: 2  . Years of education: Not on file  . Highest education level: Master's degree (e.g., MA, MS, MEng, MEd, MSW, MBA)  Occupational History  . Not on file  Social Needs  . Financial resource strain: Not hard at all  . Food insecurity:    Worry: Never true    Inability: Never true  . Transportation needs:    Medical: No    Non-medical: No  Tobacco Use  . Smoking status: Former Smoker    Packs/day: 1.00    Years: 20.00    Pack years: 20.00    Types: Cigarettes    Last attempt to quit: 1977    Years since quitting: 42.5  . Smokeless tobacco: Never Used  Substance and Sexual Activity  . Alcohol use: No  . Drug use: No  . Sexual activity: Never  Lifestyle  . Physical activity:    Days per week: 2 days    Minutes per session: 30 min  . Stress: Not at all  Relationships  . Social connections:    Talks on phone: More than three times a week    Gets together: More than three times a week    Attends religious service: More than 4 times per year    Active member of club or organization: Yes    Attends meetings of clubs or organizations: More than 4 times per year    Relationship status: Widowed  . Intimate partner violence:    Fear of current or ex partner: No    Emotionally abused: No    Physically abused: No    Forced sexual activity: No  Other Topics Concern  . Not on file  Social History Narrative   Marital Status- Widowed    Lives by herself    Employement- Retired Pharmacist, hospital   Exercise hx- Does PT     Review of Systems: See HPI, otherwise negative ROS  Physical Exam: BP (!) 134/28   Pulse (!) 58   Temp (!) 97.4 F (36.3 C) (Tympanic)   Resp 18   Ht 5' 2.5" (1.588 m)   Wt 201 lb (91.2 kg)   SpO2 98%   BMI 36.18 kg/m  General:   Alert,  pleasant and cooperative in NAD Head:   Normocephalic and atraumatic. Neck:  Supple; no masses or thyromegaly. Lungs:  Clear throughout to auscultation, normal respiratory effort.    Heart:  +S1, +S2, Regular rate and rhythm, No edema. Abdomen:  Soft, nontender and nondistended. Normal bowel sounds, without guarding, and without rebound.   Neurologic:  Alert and  oriented x4;  grossly normal neurologically.  Impression/Plan: [name] is here for an EGD for dysphagia, and radiation induced esophageal stricture.   Risks, benefits, limitations, and alternatives regarding the procedure have been reviewed with the patient.  Questions have been answered.  All parties agreeable.   Virgel Manifold, MD  11/18/2017, 11:27 AM

## 2017-11-18 NOTE — Transfer of Care (Signed)
Immediate Anesthesia Transfer of Care Note  Patient: [name]  Procedure(s) Performed: ESOPHAGOGASTRODUODENOSCOPY (EGD) WITH PROPOFOL (N/A )  Patient Location: PACU  Anesthesia Type:General  Level of Consciousness: sedated  Airway & Oxygen Therapy: Patient Spontanous Breathing and Patient connected to nasal cannula oxygen  Post-op Assessment: Report given to RN and Post -op Vital signs reviewed and stable  Post vital signs: Reviewed and stable  Last Vitals:  Vitals Value Taken Time  BP 100/44 11/18/2017 12:22 PM  Temp 36.2 C 11/18/2017 12:22 PM  Pulse 68 11/18/2017 12:22 PM  Resp 15 11/18/2017 12:22 PM  SpO2 94 % 11/18/2017 12:22 PM  Vitals shown include unvalidated device data.  Last Pain:  Vitals:   11/18/17 1222  TempSrc: Tympanic  PainSc: 0-No pain         Complications: No apparent anesthesia complications

## 2017-11-18 NOTE — Anesthesia Post-op Follow-up Note (Signed)
Anesthesia QCDR form completed.        

## 2017-11-18 NOTE — Anesthesia Postprocedure Evaluation (Signed)
Anesthesia Post Note  Patient: [name]  Procedure(s) Performed: ESOPHAGOGASTRODUODENOSCOPY (EGD) WITH PROPOFOL (N/A )  Patient location during evaluation: PACU Anesthesia Type: General Level of consciousness: awake and alert and oriented Pain management: pain level controlled Vital Signs Assessment: post-procedure vital signs reviewed and stable Respiratory status: spontaneous breathing Cardiovascular status: blood pressure returned to baseline Anesthetic complications: no     Last Vitals:  Vitals:   11/18/17 1038 11/18/17 1222  BP: (!) 134/28 (!) 100/44  Pulse: (!) 58 69  Resp: 18 15  Temp: (!) 36.3 C (!) 36.2 C  SpO2: 98% 92%    Last Pain:  Vitals:   11/18/17 1222  TempSrc: Tympanic  PainSc: 0-No pain                 Rafaela Dinius

## 2017-11-18 NOTE — Op Note (Signed)
[facility] Gastroenterology Patient Name: [name] Procedure Date: 11/18/2017 11:39 AM MRN: [id number] Account #: [id number] Date of Birth: 1935/05/08 Admit Type: Outpatient Age: 83 Room: [facility] ENDO ROOM 1 Gender: Female Note Status: Finalized Procedure:            Upper GI endoscopy Indications:          Dysphagia, Stricture of the esophagus, For therapy of                        esophageal stricture Providers:            Aundria Bitterman B. Bonna Gains MD, MD Referring MD:         Elveria Rising. Damita Dunnings, MD (Referring MD) Medicines:            Monitored Anesthesia Care Complications:        No immediate complications. Procedure:            Pre-Anesthesia Assessment:                       - The risks and benefits of the procedure and the                        sedation options and risks were discussed with the                        patient. All questions were answered and informed                        consent was obtained.                       - Patient identification and proposed procedure were                        verified prior to the procedure.                       - ASA Grade Assessment: III - A patient with severe                        systemic disease.                       After obtaining informed consent, the endoscope was                        passed under direct vision. Throughout the procedure,                        the patient's blood pressure, pulse, and oxygen                        saturations were monitored continuously. The Endoscope                        was introduced through the mouth, and advanced to the                        afferent and efferent jejunal loops. The upper GI  endoscopy was accomplished with ease. The patient                        tolerated the procedure well. Findings:      One severe (stenosis; an endoscope cannot pass) stenosis was found 34 to       35 cm from the incisors. This stenosis measured  1 cm (in length). The       stenosis was traversed after dilation. The dilation site was examined       and showed mild mucosal disruption and mild improvement in luminal       narrowing. Area was successfully injected with 40mg  i.e. 4 mL of       triamcinolone (10 mg/mL) in 4 quadrants for refractory esophageal       stricture.      Pill were found in the distal esophagus. It was seen at the stricture       site and easily passed the area spontaneously and entered the stomach,       prior to dilation, as the esophagus was insufflated with air for       visualization.      One non-bleeding superficial gastric ulcer with no stigmata of bleeding       was found at the anastomosis. The lesion was 7 mm in largest dimension.      The exam of the stomach was otherwise normal.      The examined jejunum was normal. Impression:           - Esophageal stenosis. Dilated and Injected.                       - Pill were found in the esophagus.                       - Non-bleeding gastric anatomotic ulcer with no                        stigmata of bleeding.                       - Normal examined jejunum.                       - No specimens collected. Recommendation:       - Repeat upper endoscopy in 3-4 weeks to assess disease                        activity.                       - Return to GI clinic as previously scheduled.                       - Follow an antireflux regimen.                       - Continue present medications.                       - Take prescribed proton pump inhibitor or H2 blocker                        (antacid) medications 30 - 60 minutes before meals.                       -  Use sucralfate suspension 1 gram PO QID.                       - The findings and recommendations were discussed with                        the patient.                       - The findings and recommendations were discussed with                        the patient's family. Procedure Code(s):     --- Professional ---                       [phone], Esophagogastroduodenoscopy, flexible, transoral;                        diagnostic, including collection of specimen(s) by                        brushing or washing, when performed (separate procedure) Diagnosis Code(s):    --- Professional ---                       K22.2, Esophageal obstruction                       T18.198A, Other foreign object in esophagus causing                        other injury, initial encounter                       K25.9, Gastric ulcer, unspecified as acute or chronic,                        without hemorrhage or perforation                       R13.10, Dysphagia, unspecified CPT copyright 2017 [employer]. All rights reserved. The codes documented in this report are preliminary and upon coder review may  be revised to meet current compliance requirements.  [name], MD Margretta Sidle B. Bonna Gains MD, MD 11/18/2017 12:29:17 PM This report has been signed electronically. Number of Addenda: 0 Note Initiated On: 11/18/2017 11:39 AM      [facility]

## 2017-11-18 NOTE — Anesthesia Preprocedure Evaluation (Signed)
Anesthesia Evaluation  Patient identified by MRN, date of birth, ID band Patient awake    Reviewed: Allergy & Precautions, NPO status , Patient's Chart, lab work & pertinent test results, reviewed documented beta blocker date and time   History of Anesthesia Complications Negative for: history of anesthetic complications  Airway Mallampati: II       Dental  (+) Dental Advidsory Given   Pulmonary neg shortness of breath, asthma , sleep apnea and Continuous Positive Airway Pressure Ventilation , COPD,  COPD inhaler, neg recent URI, former smoker,           Cardiovascular hypertension, Pt. on medications and Pt. on home beta blockers + CAD and + Peripheral Vascular Disease  (-) Past MI and (-) CHF (-) dysrhythmias + Valvular Problems/Murmurs (murmur, no tx)      Neuro/Psych neg Seizures    GI/Hepatic Neg liver ROS, GERD  Medicated,  Endo/Other  neg diabetes  Renal/GU negative Renal ROS     Musculoskeletal   Abdominal   Peds  Hematology   Anesthesia Other Findings Past Medical History: No date: Arthritis No date: Asthma No date: COPD (chronic obstructive pulmonary disease) (HCC) No date: Dysphonia No date: Dyspnea No date: Esophageal cancer (HCC) No date: GERD (gastroesophageal reflux disease) No date: Heart murmur No date: History of kidney stones No date: Hypertension 10/07/2017: Hypokalemia No date: Melanoma ([place]) No date: OAB (overactive bladder) No date: OSA on CPAP No date: Paresthesia     Comment:  from neck down after spinal abscess No date: Personal history of chemotherapy     Comment:  esophageal cancer No date: Personal history of radiation therapy     Comment:  esophageal cancer No date: Pupil asymmetry     Comment:  R pupil defect No date: Skin cancer No date: Sleep apnea 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   Reproductive/Obstetrics                             Anesthesia  Physical  Anesthesia Plan  ASA: III  Anesthesia Plan: General   Post-op Pain Management:    Induction: Intravenous  PONV Risk Score and Plan: 3 and Propofol infusion, TIVA and Treatment may vary due to age or medical condition  Airway Management Planned: Nasal Cannula  Additional Equipment:   Intra-op Plan:   Post-operative Plan:   Informed Consent: I have reviewed the patients History and Physical, chart, labs and discussed the procedure including the risks, benefits and alternatives for the proposed anesthesia with the patient or authorized representative who has indicated his/her understanding and acceptance.     Plan Discussed with:   Anesthesia Plan Comments:         Anesthesia Quick Evaluation

## 2017-11-18 NOTE — Anesthesia Procedure Notes (Signed)
Date/Time: 11/18/2017 11:50 AM Performed by: Johnna Acosta, CRNA Pre-anesthesia Checklist: Patient identified, Emergency Drugs available, Suction available, Patient being monitored and Timeout performed Patient Re-evaluated:Patient Re-evaluated prior to induction Oxygen Delivery Method: Nasal cannula Preoxygenation: Pre-oxygenation with 100% oxygen

## 2017-11-19 ENCOUNTER — Encounter: Payer: Self-pay | Admitting: Gastroenterology

## 2017-11-21 ENCOUNTER — Other Ambulatory Visit: Payer: Self-pay

## 2017-11-21 ENCOUNTER — Telehealth: Payer: Self-pay

## 2017-11-21 DIAGNOSIS — K222 Esophageal obstruction: Secondary | ICD-10-CM

## 2017-11-21 NOTE — Telephone Encounter (Signed)
I spoke with pt to schedule EGD 4 weeks out from 7/9 of which came to 8/6 (Dr. Bonna Gains in [place]) so this was made for 8/7 because the preference is the hospital to do procedure. Pt will call in the mean time if she has any problems with swallowing.

## 2017-11-24 ENCOUNTER — Other Ambulatory Visit: Payer: Self-pay

## 2017-11-24 ENCOUNTER — Inpatient Hospital Stay (HOSPITAL_BASED_OUTPATIENT_CLINIC_OR_DEPARTMENT_OTHER): Payer: Medicare Other | Admitting: Oncology

## 2017-11-24 ENCOUNTER — Encounter: Payer: Self-pay | Admitting: Radiation Oncology

## 2017-11-24 ENCOUNTER — Encounter: Payer: Self-pay | Admitting: Oncology

## 2017-11-24 ENCOUNTER — Other Ambulatory Visit: Payer: Self-pay | Admitting: *Deleted

## 2017-11-24 ENCOUNTER — Ambulatory Visit
Admission: RE | Admit: 2017-11-24 | Discharge: 2017-11-24 | Disposition: A | Discharge status: Home / Self Care | Payer: Medicare Other | Source: Ambulatory Visit | Attending: Radiation Oncology | Admitting: Radiation Oncology

## 2017-11-24 ENCOUNTER — Inpatient Hospital Stay: Payer: Medicare Other

## 2017-11-24 VITALS — BP 124/70 | HR 58 | Temp 97.5°F | Resp 16 | Wt 207.5 lb

## 2017-11-24 VITALS — BP 151/85 | HR 58 | Resp 20 | Wt 207.7 lb

## 2017-11-24 DIAGNOSIS — R634 Abnormal weight loss: Secondary | ICD-10-CM

## 2017-11-24 DIAGNOSIS — Z87891 Personal history of nicotine dependence: Secondary | ICD-10-CM

## 2017-11-24 DIAGNOSIS — Z8042 Family history of malignant neoplasm of prostate: Secondary | ICD-10-CM

## 2017-11-24 DIAGNOSIS — C155 Malignant neoplasm of lower third of esophagus: Secondary | ICD-10-CM | POA: Diagnosis present

## 2017-11-24 DIAGNOSIS — Z5111 Encounter for antineoplastic chemotherapy: Secondary | ICD-10-CM

## 2017-11-24 DIAGNOSIS — R5383 Other fatigue: Secondary | ICD-10-CM

## 2017-11-24 DIAGNOSIS — K222 Esophageal obstruction: Secondary | ICD-10-CM | POA: Diagnosis not present

## 2017-11-24 DIAGNOSIS — D509 Iron deficiency anemia, unspecified: Secondary | ICD-10-CM | POA: Diagnosis not present

## 2017-11-24 DIAGNOSIS — R1319 Other dysphagia: Secondary | ICD-10-CM

## 2017-11-24 DIAGNOSIS — R1310 Dysphagia, unspecified: Secondary | ICD-10-CM | POA: Insufficient documentation

## 2017-11-24 DIAGNOSIS — E876 Hypokalemia: Secondary | ICD-10-CM | POA: Diagnosis not present

## 2017-11-24 DIAGNOSIS — J449 Chronic obstructive pulmonary disease, unspecified: Secondary | ICD-10-CM

## 2017-11-24 DIAGNOSIS — Z803 Family history of malignant neoplasm of breast: Secondary | ICD-10-CM

## 2017-11-24 DIAGNOSIS — R234 Changes in skin texture: Secondary | ICD-10-CM

## 2017-11-24 DIAGNOSIS — Z923 Personal history of irradiation: Secondary | ICD-10-CM | POA: Diagnosis not present

## 2017-11-24 DIAGNOSIS — I10 Essential (primary) hypertension: Secondary | ICD-10-CM

## 2017-11-24 DIAGNOSIS — Z79899 Other long term (current) drug therapy: Secondary | ICD-10-CM

## 2017-11-24 DIAGNOSIS — Z9221 Personal history of antineoplastic chemotherapy: Secondary | ICD-10-CM

## 2017-11-24 DIAGNOSIS — Z85820 Personal history of malignant melanoma of skin: Secondary | ICD-10-CM

## 2017-11-24 DIAGNOSIS — R5381 Other malaise: Secondary | ICD-10-CM

## 2017-11-24 DIAGNOSIS — Z801 Family history of malignant neoplasm of trachea, bronchus and lung: Secondary | ICD-10-CM

## 2017-11-24 DIAGNOSIS — G629 Polyneuropathy, unspecified: Secondary | ICD-10-CM

## 2017-11-24 DIAGNOSIS — Z8052 Family history of malignant neoplasm of bladder: Secondary | ICD-10-CM

## 2017-11-24 LAB — CBC WITH DIFFERENTIAL/PLATELET
BASOS ABS: 0 10*3/uL (ref 0–0.1)
BASOS PCT: 0 %
Eosinophils Absolute: 0.2 10*3/uL (ref 0–0.7)
Eosinophils Relative: 4 %
HEMATOCRIT: 37 % (ref 35.0–47.0)
HEMOGLOBIN: 12.7 g/dL (ref 12.0–16.0)
Lymphocytes Relative: 9 %
Lymphs Abs: 0.5 10*3/uL — ABNORMAL LOW (ref 1.0–3.6)
MCH: 34.1 pg — ABNORMAL HIGH (ref 26.0–34.0)
MCHC: 34.3 g/dL (ref 32.0–36.0)
MCV: 99.4 fL (ref 80.0–100.0)
Monocytes Absolute: 0.5 10*3/uL (ref 0.2–0.9)
Monocytes Relative: 9 %
NEUTROS ABS: 3.9 10*3/uL (ref 1.4–6.5)
NEUTROS PCT: 78 %
Platelets: 202 10*3/uL (ref 150–440)
RBC: 3.73 MIL/uL — AB (ref 3.80–5.20)
RDW: 15.2 % — ABNORMAL HIGH (ref 11.5–14.5)
WBC: 5.1 10*3/uL (ref 3.6–11.0)

## 2017-11-24 LAB — COMPREHENSIVE METABOLIC PANEL
ALK PHOS: 77 U/L (ref 38–126)
ALT: 15 U/L (ref 0–44)
ANION GAP: 8 (ref 5–15)
AST: 36 U/L (ref 15–41)
Albumin: 3.8 g/dL (ref 3.5–5.0)
BILIRUBIN TOTAL: 0.8 mg/dL (ref 0.3–1.2)
BUN: 14 mg/dL (ref 8–23)
CALCIUM: 8.9 mg/dL (ref 8.9–10.3)
CO2: 29 mmol/L (ref 22–32)
CREATININE: 0.81 mg/dL (ref 0.44–1.00)
Chloride: 103 mmol/L (ref 98–111)
Glucose, Bld: 121 mg/dL — ABNORMAL HIGH (ref 70–99)
Potassium: 4 mmol/L (ref 3.5–5.1)
SODIUM: 140 mmol/L (ref 135–145)
TOTAL PROTEIN: 6.7 g/dL (ref 6.5–8.1)

## 2017-11-24 MED ORDER — FLUOROURACIL CHEMO INJECTION 2.5 GM/50ML
400.0000 mg/m2 | Freq: Once | INTRAVENOUS | Status: AC
  Administered 2017-11-24: 850 mg via INTRAVENOUS
  Filled 2017-11-24: qty 17

## 2017-11-24 MED ORDER — SODIUM CHLORIDE 0.9% FLUSH
10.0000 mL | INTRAVENOUS | Status: DC | PRN
  Administered 2017-11-24: 10 mL via INTRAVENOUS
  Filled 2017-11-24: qty 10

## 2017-11-24 MED ORDER — FLUOROURACIL CHEMO INJECTION 5 GM/100ML
2400.0000 mg/m2 | INTRAVENOUS | Status: DC
  Administered 2017-11-24: 5200 mg via INTRAVENOUS
  Filled 2017-11-24: qty 100

## 2017-11-24 MED ORDER — SODIUM CHLORIDE 0.9 % IV SOLN
INTRAVENOUS | Status: DC
  Administered 2017-11-24: 11:00:00 via INTRAVENOUS
  Filled 2017-11-24: qty 1000

## 2017-11-24 MED ORDER — LEUCOVORIN CALCIUM INJECTION 100 MG
20.0000 mg/m2 | Freq: Once | INTRAMUSCULAR | Status: AC
  Administered 2017-11-24: 44 mg via INTRAVENOUS
  Filled 2017-11-24: qty 2.2

## 2017-11-24 NOTE — Progress Notes (Signed)
Hematology/Oncology Follow Up Note  Hospital  Telephone:([phone] [phone]  Patient Care Team: Tonia Ghent, MD as PCP - General (Family Medicine)   Name of the patient: [name]  [id number]  Sep 12, 1934   REASON FOR VISIT Follow up of chemotherapy management of esophageal adenocarcinoma  HISTORY OF PRESENT ILLNESS Patient is a 83 year old female who has metastatic esophageal cancer with liver involvement.  Weight loss 20 pounds for the past year prior to diagnosis.  # EGD during admission showed partially obstructive esophageal mass at the distal part of esophagus. Biopsy was taken. Pathology showed esophageal adenocarcinoma. #US biopsy of liver lesion to proof distant metastasis.  Report family history of breast cancer, but she has been having routine mammograms.  # Lives alone. She's a former smoker.  # Molecular Testing:  #[employer] Cdx: No reportable alternations with companion diagnostic (CDx) claims.  MS stable Tumor mutation burden: Cannot be determined, CCND3 amplification: Equivocal.  Amended reports on 06/04/2017, Tumor mutational burden changed from can not be determined to "TMB -low" on the re-analyzed samples.  # HER 2 negative.   Current Treatment S/p 6 cycles of FOLFOX, PET scan showed excellent partial response from both chemotherapy and radiation, result was discussed with patient.  currently on 5-FU maintenance.  S/p palliative radiation in January 2019 # Developed esophageal stricture secondary to radiation in April and got dilation via endoscopy. EGD shoed Benign-appearing esophageal stenosis. This is a result of radiation therapy.- LA Grade C radiation esophagitis. - The previously noted esophageal mass in Nov 2018 was much decreased indicating good response to radiation/chemotherapy. - Normal stomach.- Normal examined jejunum.- No specimens collected.    INTERVAL HISTORY  Patient presents for assessment  prior to maintenance 5-FU chemotherapy for esophageal adenocarcinoma treatment.  #Chemotherapy, she tolerates well.  Denies any nausea vomiting. #Dysphagia, chronic problem for her.  Usually gets better after dilation however dysphagia recurs multiple times.  She recently had another EGD and dilation of esophageal stricture.  She takes Protonix 40 mg twice a day.  Today she feels that dysphagia has improved after the EGD and slightly worse as time goes on.  Mild swallowing difficulty.  #Fatigue, stable. #Skin peeling on her hands.  Mild, not associated with any swelling or pain. 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  Stable.  Takes Lyrica.  Not worse or better.   Review of systems Review of Systems  Constitutional: Positive for malaise/fatigue. Negative for chills, diaphoresis, fever and weight loss.  HENT: Negative for congestion, ear discharge, ear pain, hearing loss, nosebleeds, sinus pain and sore throat.   Eyes: Negative for double vision, photophobia, pain, discharge and redness.  Respiratory: Negative for cough, hemoptysis, sputum production, shortness of breath and wheezing.   Cardiovascular: Negative for chest pain, palpitations, orthopnea, claudication and leg swelling.  Gastrointestinal: Negative for abdominal pain, blood in stool, constipation, diarrhea, heartburn, melena, nausea and vomiting.       Dysphagia  Genitourinary: Negative for dysuria, flank pain, frequency, hematuria and urgency.  Musculoskeletal: Negative for back pain, joint pain, myalgias and neck pain.  Skin: Negative for itching and rash.  Neurological: Positive for tingling and sensory change. Negative for dizziness, tremors, focal weakness, weakness and headaches.  Endo/Heme/Allergies: Negative for environmental allergies. Does not bruise/bleed easily.  Psychiatric/Behavioral: Negative for depression, hallucinations, substance abuse and suicidal ideas. The patient is not nervous/anxious.      Allergies  Allergen Reactions  . Cefuroxime Nausea Only  . Codeine Nausea Only  .  Doxycycline Other (See Comments)    Lip swelling  . Gabapentin Other (See Comments)    Swelling.   . Iohexol Itching    07-15-13 pt developed itching on fingers after contrast given. Dr. Irish Elders looked at pt and said to put in system as allergy. BB  . Other Other (See Comments)    Contrast Dye- caused fever, chills, awful feeling Bandages - itching, rash  . Oxycodone Other (See Comments)    nausea  . Quinolones Swelling    Lip swelling  . Synvisc [Hylan G-F 20] Swelling     Past Medical History:  Diagnosis Date  . Arthritis   . Asthma   . COPD (chronic obstructive pulmonary disease) ([place])   . Dysphonia   . Dyspnea   . Esophageal cancer ([place])   . GERD (gastroesophageal reflux disease)   . Heart murmur   . History of kidney stones   . Hypertension   . Hypokalemia 10/07/2017  . Melanoma ([place])   . OAB (overactive bladder)   . OSA on CPAP   . 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 Personal history of chemotherapy    esophageal cancer  . Personal history of radiation therapy    esophageal cancer  . Pupil asymmetry    R pupil defect  . Skin cancer   . Sleep apnea   . Spinal cord abscess      Past Surgical History:  Procedure Laterality Date  . ABDOMINAL HYSTERECTOMY    . ANTERIOR CERVICAL DECOMP/DISCECTOMY FUSION N/A 07/16/2013   Procedure: ANTERIOR CERVICAL DECOMPRESSION/DISCECTOMY FUSION mutlipleLEVELS C4-7;  Surgeon: Erline Levine, MD;  Location: [facility];  Service: Neurosurgery;  Laterality: N/A;  . APPENDECTOMY    . carpel tunn Bilateral   . CATARACT EXTRACTION    . CATARACT EXTRACTION, BILATERAL    . CHOLECYSTECTOMY    . dental implant    . ESOPHAGOGASTRODUODENOSCOPY Left 03/21/2017   Procedure: ESOPHAGOGASTRODUODENOSCOPY (EGD);  Surgeon: Virgel Manifold, MD;  Location: [facility] END[facility]COPY;  Service: Endoscopy;  Laterality: Left;  .  ESOPHAGOGASTRODUODENOSCOPY (EGD) WITH PROPOFOL N/A 08/20/2017   Procedure: ESOPHAGOGASTRODUODENOSCOPY (EGD) WITH PROPOFOL;  Surgeon: Virgel Manifold, MD;  Location: [facility] ENDOSCOPY;  Service: Endoscopy;  Laterality: N/A;  . ESOPHAGOGASTRODUODENOSCOPY (EGD) WITH PROPOFOL N/A 09/19/2017   Procedure: ESOPHAGOGASTRODUODENOSCOPY (EGD) WITH PROPOFOL;  Surgeon: Virgel Manifold, MD;  Location: [facility] ENDOSCOPY;  Service: Endoscopy;  Laterality: N/A;  . ESOPHAGOGASTRODUODENOSCOPY (EGD) WITH PROPOFOL N/A 10/03/2017   Procedure: ESOPHAGOGASTRODUODENOSCOPY (EGD) WITH PROPOFOL;  Surgeon: Virgel Manifold, MD;  Location: [facility] ENDOSCOPY;  Service: Endoscopy;  Laterality: N/A;  . ESOPHAGOGASTRODUODENOSCOPY (EGD) WITH PROPOFOL N/A 10/22/2017   Procedure: ESOPHAGOGASTRODUODENOSCOPY (EGD) WITH PROPOFOL;  Surgeon: Virgel Manifold, MD;  Location: [facility] ENDOSCOPY;  Service: Endoscopy;  Laterality: N/A;  . ESOPHAGOGASTRODUODENOSCOPY (EGD) WITH PROPOFOL N/A 11/18/2017   Procedure: ESOPHAGOGASTRODUODENOSCOPY (EGD) WITH PROPOFOL;  Surgeon: Virgel Manifold, MD;  Location: [facility] ENDOSCOPY;  Service: Endoscopy;  Laterality: N/A;  . EYE SURGERY    . GASTRIC BYPASS    . HIATAL HERNIA REPAIR    . JOINT REPLACEMENT    . MELANOMA EXCISION    . PORTA CATH INSERTION N/A 04/07/2017   Procedure: PORTA CATH INSERTION;  Surgeon: Algernon Huxley, MD;  Location: [facility] CV LAB;  Service: Cardiovascular;  Laterality: N/A;  . POSTERIOR CERVICAL FUSION/FORAMINOTOMY N/A 07/28/2013   Procedure: Cervical four-seven  posterior cervical fusion;  Surgeon: Erline Levine, MD;  Location: [place];  Service: Neurosurgery;  Laterality: N/A;  . TONSILLECTOMY    . TOTAL KNEE ARTHROPLASTY      Social History   Socioeconomic History  . Marital status: Widowed    Spouse name: Not on file  . Number of children: 2  . Years of education: Not on file  . Highest education level: Master's degree (e.g., MA, MS, MEng, MEd, MSW, MBA)   Occupational History  . Not on file  Social Needs  . Financial resource strain: Not hard at all  . Food insecurity:    Worry: Never true    Inability: Never true  . Transportation needs:    Medical: No    Non-medical: No  Tobacco Use  . Smoking status: Former Smoker    Packs/day: 1.00    Years: 20.00    Pack years: 20.00    Types: Cigarettes    Last attempt to quit: 1977    Years since quitting: 42.5  . Smokeless tobacco: Never Used  Substance and Sexual Activity  . Alcohol use: No  . Drug use: No  . Sexual activity: Never  Lifestyle  . Physical activity:    Days per week: 2 days    Minutes per session: 30 min  . Stress: Not at all  Relationships  . Social connections:    Talks on phone: More than three times a week    Gets together: More than three times a week    Attends religious service: More than 4 times per year    Active member of club or organization: Yes    Attends meetings of clubs or organizations: More than 4 times per year    Relationship status: Widowed  . Intimate partner violence:    Fear of current or ex partner: No    Emotionally abused: No    Physically abused: No    Forced sexual activity: No  Other Topics Concern  . Not on file  Social History Narrative   Marital Status- Widowed    Lives by herself    Employement- Retired Pharmacist, hospital   Exercise hx- Does PT     Family History  Problem Relation Age of Onset  . Breast cancer Mother 58  . Arthritis-Osteo Mother   . Breast cancer Sister 49  . Bladder Cancer Sister   . Bladder Cancer Father   . Tongue cancer Father   . Congenital heart disease Father   . Prostate cancer Brother   . Uterine cancer Maternal Aunt   . Lung cancer Paternal Uncle   . Leukemia Maternal Grandmother   . Liver cancer Paternal Grandmother   . Colon cancer Neg Hx      Current Outpatient Medications:  .  acetaminophen (TYLENOL) 325 MG tablet, Take 650 mg by mouth every 6 (six) hours as needed for moderate pain or  headache. , Disp: , Rfl:  .  ALBUTEROL SULFATE HFA IN, Inhale into the lungs. , Disp: , Rfl:  .  bisacodyl (DULCOLAX) 10 MG suppository, Place rectally., Disp: , Rfl:  .  calcium carbonate (TUMS EX) 750 MG chewable tablet, Chew by mouth., Disp: , Rfl:  .  CARAFATE 1 GM/10ML suspension, TAKE 10MLS BY MOUTH FOUR TIMES DAILY, Disp: 420 mL, Rfl: 1 .  Cholecalciferol (VITAMIN D) 2000 UNITS CAPS, Take 2,000 Units by mouth daily., Disp: , Rfl:  .  cyanocobalamin 500 MCG tablet, Take 500 mcg every other day by mouth., Disp: , Rfl:  .  Diclofenac Sodium 1 % CREA, Apply to left arm as needed for  pain three times daily, Disp: 120 g, Rfl: 0 .  fluticasone (FLONASE) 50 MCG/ACT nasal spray, Place 1-2 sprays into both nostrils daily., Disp: 16 g, Rfl: 5 .  furosemide (LASIX) 20 MG tablet, Take 1 tablet (20 mg total) by mouth daily., Disp: 90 tablet, Rfl: 3 .  hydrocortisone cream 1 %, Apply 1 application topically daily as needed for itching. , Disp: , Rfl:  .  lidocaine-prilocaine (EMLA) cream, Apply to affected area once, Disp: 30 g, Rfl: 3 .  loperamide (IMODIUM) 2 MG capsule, Take 1 capsule (2 mg total) by mouth See admin instructions. With onset of loose stool, take 82m followed by 267mevery 2 hours until 12 hours have passed without loose bowel movement. Maximum: 16 mg/day, Disp: 120 capsule, Rfl: 0 .  loratadine (CLARITIN) 10 MG tablet, Take 10 mg by mouth daily., Disp: , Rfl:  .  LYRICA 150 MG capsule, TAKE 1 CAPSULE BY MOUTH TWO TIMES DAILY, Disp: 180 capsule, Rfl: 1 .  metoprolol succinate (TOPROL-XL) 50 MG 24 hr tablet, TAKE 1 TABLET BY MOUTH DAILY, Disp: 90 tablet, Rfl: 1 .  ondansetron (ZOFRAN) 8 MG tablet, Take 1 tablet (8 mg total) by mouth 2 (two) times daily as needed for refractory nausea / vomiting. Start on day 3 after chemotherapy., Disp: 30 tablet, Rfl: 1 .  oxybutynin (DITROPAN) 5 MG tablet, TAKE 1 TABLET BY MOUTH EVERY MORNING AND1/2 TABLET AT BEDTIME, Disp: 135 tablet, Rfl: 0 .   pantoprazole (PROTONIX) 40 MG tablet, Take 40 mg by mouth daily., Disp: , Rfl:  .  Pediatric Multiple Vitamins (FLINTSTONES MULTIVITAMIN PO), Take by mouth., Disp: , Rfl:  .  Polyethyl Glycol-Propyl Glycol (SYSTANE OP), Apply 1 drop to eye daily as needed (dry eyes)., Disp: , Rfl:  .  potassium chloride (KLOR-CON) 20 MEQ packet, Take 20 mEq by mouth as needed (Daily as needed with fluid pill)., Disp: 30 packet, Rfl: 1 .  prochlorperazine (COMPAZINE) 10 MG tablet, Take 1 tablet (10 mg total) by mouth every 6 (six) hours as needed (Nausea or vomiting)., Disp: 30 tablet, Rfl: 1 .  pyridOXINE (VITAMIN B-6) 100 MG tablet, Take 1 tablet (100 mg total) by mouth daily., Disp: 30 tablet, Rfl: 3 .  ranitidine (ZANTAC) 150 MG/10ML syrup, Take 10 mLs (150 mg total) by mouth at bedtime., Disp: 300 mL, Rfl: 3 .  senna-docusate (SENOKOT-S) 8.6-50 MG tablet, Take 1 tablet at bedtime as needed by mouth for mild constipation., Disp: , Rfl:  .  traMADol (ULTRAM) 50 MG tablet, TAKE 1 TABLET BY MOUTH 3 TIMES DAILY AS NEEDED FOR PAIN, Disp: 30 tablet, Rfl: 0 .  traZODone (DESYREL) 50 MG tablet, TAKE 1/2 TABLET BY MOUTH AT BEDTIME IF NEEDED FOR SLEEP, Disp: 45 tablet, Rfl: 3 .  pantoprazole (PROTONIX) 40 MG tablet, Take 1 tablet (40 mg total) by mouth 2 (two) times daily before a meal., Disp: 60 tablet, Rfl: 1 No current facility-administered medications for this visit.   Facility-Administered Medications Ordered in Other Visits:  .  0.9 %  sodium chloride infusion, , Intravenous, Continuous, YuEarlie ServerMD, Stopped at 11/24/17 1120 .  fluorouracil (ADRUCIL) 5,200 mg in sodium chloride 0.9 % 146 mL chemo infusion, 2,400 mg/m2 (Treatment Plan Recorded), Intravenous, 1 day or 1 dose, YuEarlie ServerMD, 5,200 mg at 11/24/17 1118 .  sodium chloride flush (NS) 0.9 % injection 10 mL, 10 mL, Intravenous, PRN, YuEarlie ServerMD, 10 mL at 11/24/17 0900  Physical exam:  Vitals:   11/24/17 [id number]  BP: 124/70  Pulse: (!) 58  Resp: 16  Temp:  (!) 97.5 F (36.4 C)  TempSrc: Tympanic  Weight: 207 lb 8 oz (94.1 kg)  ECOG 1 Physical Exam  Constitutional: She is oriented to person, place, and time and well-developed, well-nourished, and in no distress. No distress.  HENT:  Head: Normocephalic and atraumatic.  Nose: Nose normal.  Mouth/Throat: Oropharynx is clear and moist. No oropharyngeal exudate.  Eyes: Pupils are equal, round, and reactive to light. Conjunctivae and EOM are normal. Left eye exhibits no discharge. No scleral icterus.  Neck: Normal range of motion. Neck supple. No JVD present.  Cardiovascular: Normal rate and regular rhythm.  Murmur heard. Pulmonary/Chest: Effort normal and breath sounds normal. No respiratory distress. She has no wheezes. She has no rales. She exhibits no tenderness.  Abdominal: Soft. Bowel sounds are normal. She exhibits no distension and no mass. There is no tenderness. There is no rebound and no guarding.  Musculoskeletal: Normal range of motion. She exhibits no edema, tenderness or deformity.  Lymphadenopathy:    She has no cervical adenopathy.  Neurological: She is alert and oriented to person, place, and time. No cranial nerve deficit. She exhibits normal muscle tone. Coordination normal.  Skin: Skin is warm and dry. She is not diaphoretic. No erythema.  Dry skin bilateral hands.  Very very mild skin peeling.  Psychiatric: Affect and judgment normal.    CMP Latest Ref Rng & Units 11/24/2017  Glucose 70 - 99 mg/dL 121(H)  BUN 8 - 23 mg/dL 14  Creatinine 0.44 - 1.00 mg/dL 0.81  Sodium 135 - 145 mmol/L 140  Potassium 3.5 - 5.1 mmol/L 4.0  Chloride 98 - 111 mmol/L 103  CO2 22 - 32 mmol/L 29  Calcium 8.9 - 10.3 mg/dL 8.9  Total Protein 6.5 - 8.1 g/dL 6.7  Total Bilirubin 0.3 - 1.2 mg/dL 0.8  Alkaline Phos 38 - 126 U/L 77  AST 15 - 41 U/L 36  ALT 0 - 44 U/L 15   CBC Latest Ref Rng & Units 11/24/2017  WBC 3.6 - 11.0 K/uL 5.1  Hemoglobin 12.0 - 16.0 g/dL 12.7  Hematocrit 35.0 - 47.0 %  37.0  Platelets 150 - 440 K/uL 202     Assessment and plan  Cancer Staging Malignant neoplasm of lower third of esophagus (HCC) Staging form: Esophagus - Adenocarcinoma, AJCC 8th Edition - Clinical stage from 04/10/2017: Stage IVB (cTX, cNX, pM1) - Signed by Earlie Server, MD on 04/10/2017  1. Malignant neoplasm of lower third of esophagus (HCC)   2. Encounter for antineoplastic chemotherapy   3. Esophageal stricture   4. Peeling skin    #Esophageal cancer, last PET scan showed complete remission.  Tolerated chemotherapy well. Labs reviewed and discussed with patient.  Acceptable Proceed with maintenance 5-FU every 2 weeks.  #Esophageal stricture: Recurrent issue for patient.  Secondary to radiation.  Continue follow-up with gastroenterology for dilation.  # Iron deficiency anemia: I stable.  Continue monitor. #Hand skin peeling, discussed with patient that this might be an early sign of hand-and-foot syndrome.  Advised patient to keep her hands and feet moisturized.  She voices understanding.  Follow up in 2 weeks for next cycle of maintenance 5-FU.Marland Kitchen  Patient knows to call if any concerns or questions.  No orders of the defined types were placed in this encounter.   Earlie Server, MD, PhD Hematology Oncology [facility] Pager- [phone] 11/24/17

## 2017-11-24 NOTE — Progress Notes (Signed)
Radiation Oncology Follow up Note  Name: [name]   Date:   11/24/2017 MRN:  [id number] DOB: June 03, 1934    This 83 y.o. female presents to the clinic today for  Month follow-up status post radiation therapy to her distal esophagus for stage IV adenocarcinoma with liver metastasis.Marland Kitchen  REFERRING PROVIDER: Tonia Ghent, MD  HPI: patient is an 83 year old female now out 5 months having completed palliative radiation therapy to her distal esophagus for adenocarcinoma. Seen today in routine follow-up she is doing well. She continues to have some dysphagia with food sticking in the middle of her chest she's had multiple dilatations of her esophagus. Her PET/CT scans continued to show marked improvement with almost complete resolution of hypermetabolic areas and resolution of liver metastasis.Marland Kitchenshe is currently on maintenance 5-FU.her weight is stable.  COMPLICATIONS OF TREATMENT: present esophageal dilatation  FOLLOW UP COMPLIANCE: keeps appointments   PHYSICAL EXAM:  BP (!) 151/85   Pulse (!) 58   Resp 20   Wt 207 lb 10.8 oz (94.2 kg)   BMI 37.38 kg/m  Well-developed well-nourished patient in NAD. HEENT reveals PERLA, EOMI, discs not visualized.  Oral cavity is clear. No oral mucosal lesions are identified. Neck is clear without evidence of cervical or supraclavicular adenopathy. Lungs are clear to A&P. Cardiac examination is essentially unremarkable with regular rate and rhythm without murmur rub or thrill. Abdomen is benign with no organomegaly or masses noted. Motor sensory and DTR levels are equal and symmetric in the upper and lower extremities. Cranial nerves II through XII are grossly intact. Proprioception is intact. No peripheral adenopathy or edema is identified. No motor or sensory levels are noted. Crude visual fields are within normal range.  RADIOLOGY RESULTS: CT scans reviewed showing excellent response to treatment I see no evidence of stricture in her thoracic  esophagus.  PLAN: present time patient continues to do well. Not sure the source of her stricture since this area was not a my original treatment field as I treated only the distal end of her esophagus. Patient's weight continues to be maintained despite her difficulty with dysphagia. At this time I have asked to see her back in 6 months for follow-up. She continues close follow-up care and maintenance 5-FU with medical oncology. Patient is to: Anytime with any concerns.  I would like to take this opportunity to thank you for allowing me to participate in the care of your patient.Noreene Filbert, MD

## 2017-11-24 NOTE — Progress Notes (Signed)
Patient here today for follow up.  Patient states no new concerns today  

## 2017-11-25 ENCOUNTER — Other Ambulatory Visit: Payer: Self-pay | Admitting: Oncology

## 2017-11-25 LAB — CEA: CEA: 2 ng/mL (ref 0.0–4.7)

## 2017-11-26 ENCOUNTER — Inpatient Hospital Stay: Payer: Medicare Other

## 2017-11-26 VITALS — BP 126/75 | HR 66 | Temp 96.8°F | Resp 20

## 2017-11-26 DIAGNOSIS — C155 Malignant neoplasm of lower third of esophagus: Secondary | ICD-10-CM

## 2017-11-26 MED ORDER — HEPARIN SOD (PORK) LOCK FLUSH 100 UNIT/ML IV SOLN
500.0000 [IU] | Freq: Once | INTRAVENOUS | Status: AC | PRN
  Administered 2017-11-26: 500 [IU]
  Filled 2017-11-26: qty 5

## 2017-11-26 MED ORDER — SODIUM CHLORIDE 0.9% FLUSH
10.0000 mL | INTRAVENOUS | Status: DC | PRN
  Administered 2017-11-26: 10 mL
  Filled 2017-11-26: qty 10

## 2017-11-27 ENCOUNTER — Telehealth: Payer: Self-pay | Admitting: *Deleted

## 2017-11-27 NOTE — Telephone Encounter (Signed)
Patient needs refill for tramadol. She stated that her last prescription was only for 10 days.

## 2017-11-28 ENCOUNTER — Other Ambulatory Visit: Payer: Self-pay | Admitting: Oncology

## 2017-11-28 MED ORDER — TRAMADOL HCL 50 MG PO TABS: 50.0000 mg | ORAL_TABLET | Freq: Four times a day (QID) | ORAL | 0 refills | Status: DC | PRN

## 2017-11-28 NOTE — Telephone Encounter (Signed)
Refilled

## 2017-12-08 ENCOUNTER — Inpatient Hospital Stay: Payer: Medicare Other

## 2017-12-08 ENCOUNTER — Inpatient Hospital Stay (HOSPITAL_BASED_OUTPATIENT_CLINIC_OR_DEPARTMENT_OTHER): Payer: Medicare Other | Admitting: Oncology

## 2017-12-08 ENCOUNTER — Other Ambulatory Visit: Payer: Self-pay

## 2017-12-08 ENCOUNTER — Encounter: Payer: Self-pay | Admitting: Oncology

## 2017-12-08 VITALS — Ht 62.5 in

## 2017-12-08 VITALS — BP 112/70 | HR 69 | Temp 97.5°F | Resp 18 | Wt 205.4 lb

## 2017-12-08 DIAGNOSIS — Z803 Family history of malignant neoplasm of breast: Secondary | ICD-10-CM

## 2017-12-08 DIAGNOSIS — D509 Iron deficiency anemia, unspecified: Secondary | ICD-10-CM

## 2017-12-08 DIAGNOSIS — K222 Esophageal obstruction: Secondary | ICD-10-CM | POA: Diagnosis not present

## 2017-12-08 DIAGNOSIS — G629 Polyneuropathy, unspecified: Secondary | ICD-10-CM

## 2017-12-08 DIAGNOSIS — C155 Malignant neoplasm of lower third of esophagus: Secondary | ICD-10-CM

## 2017-12-08 DIAGNOSIS — E876 Hypokalemia: Secondary | ICD-10-CM

## 2017-12-08 DIAGNOSIS — Z79899 Other long term (current) drug therapy: Secondary | ICD-10-CM

## 2017-12-08 DIAGNOSIS — J449 Chronic obstructive pulmonary disease, unspecified: Secondary | ICD-10-CM

## 2017-12-08 DIAGNOSIS — Z923 Personal history of irradiation: Secondary | ICD-10-CM

## 2017-12-08 DIAGNOSIS — I10 Essential (primary) hypertension: Secondary | ICD-10-CM

## 2017-12-08 DIAGNOSIS — R1319 Other dysphagia: Secondary | ICD-10-CM

## 2017-12-08 DIAGNOSIS — R634 Abnormal weight loss: Secondary | ICD-10-CM

## 2017-12-08 DIAGNOSIS — Z9221 Personal history of antineoplastic chemotherapy: Secondary | ICD-10-CM

## 2017-12-08 DIAGNOSIS — Z87891 Personal history of nicotine dependence: Secondary | ICD-10-CM

## 2017-12-08 DIAGNOSIS — Z5111 Encounter for antineoplastic chemotherapy: Secondary | ICD-10-CM

## 2017-12-08 DIAGNOSIS — Z85820 Personal history of malignant melanoma of skin: Secondary | ICD-10-CM

## 2017-12-08 DIAGNOSIS — Z8042 Family history of malignant neoplasm of prostate: Secondary | ICD-10-CM

## 2017-12-08 DIAGNOSIS — Z8052 Family history of malignant neoplasm of bladder: Secondary | ICD-10-CM

## 2017-12-08 DIAGNOSIS — R5381 Other malaise: Secondary | ICD-10-CM

## 2017-12-08 DIAGNOSIS — L271 Localized skin eruption due to drugs and medicaments taken internally: Secondary | ICD-10-CM

## 2017-12-08 DIAGNOSIS — R5383 Other fatigue: Secondary | ICD-10-CM

## 2017-12-08 LAB — CBC WITH DIFFERENTIAL/PLATELET
Basophils Absolute: 0 10*3/uL (ref 0–0.1)
Basophils Relative: 1 %
Eosinophils Absolute: 0.2 10*3/uL (ref 0–0.7)
Eosinophils Relative: 4 %
HEMATOCRIT: 37.6 % (ref 35.0–47.0)
HEMOGLOBIN: 12.7 g/dL (ref 12.0–16.0)
LYMPHS PCT: 9 %
Lymphs Abs: 0.4 10*3/uL — ABNORMAL LOW (ref 1.0–3.6)
MCH: 33.8 pg (ref 26.0–34.0)
MCHC: 33.7 g/dL (ref 32.0–36.0)
MCV: 100.2 fL — AB (ref 80.0–100.0)
MONOS PCT: 12 %
Monocytes Absolute: 0.5 10*3/uL (ref 0.2–0.9)
NEUTROS ABS: 3.4 10*3/uL (ref 1.4–6.5)
NEUTROS PCT: 74 %
Platelets: 163 10*3/uL (ref 150–440)
RBC: 3.75 MIL/uL — ABNORMAL LOW (ref 3.80–5.20)
RDW: 15.8 % — ABNORMAL HIGH (ref 11.5–14.5)
WBC: 4.5 10*3/uL (ref 3.6–11.0)

## 2017-12-08 LAB — COMPREHENSIVE METABOLIC PANEL
ALT: 14 U/L (ref 0–44)
ANION GAP: 12 (ref 5–15)
AST: 36 U/L (ref 15–41)
Albumin: 3.7 g/dL (ref 3.5–5.0)
Alkaline Phosphatase: 82 U/L (ref 38–126)
BUN: 16 mg/dL (ref 8–23)
CO2: 26 mmol/L (ref 22–32)
CREATININE: 0.9 mg/dL (ref 0.44–1.00)
Calcium: 8.8 mg/dL — ABNORMAL LOW (ref 8.9–10.3)
Chloride: 104 mmol/L (ref 98–111)
GFR calc non Af Amer: 58 mL/min — ABNORMAL LOW (ref >60)
Glucose, Bld: 118 mg/dL — ABNORMAL HIGH (ref 70–99)
Potassium: 3.6 mmol/L (ref 3.5–5.1)
SODIUM: 142 mmol/L (ref 135–145)
Total Bilirubin: 0.7 mg/dL (ref 0.3–1.2)
Total Protein: 6.8 g/dL (ref 6.5–8.1)

## 2017-12-08 MED ORDER — LEUCOVORIN CALCIUM INJECTION 100 MG
20.0000 mg/m2 | Freq: Once | INTRAMUSCULAR | Status: AC
  Administered 2017-12-08: 44 mg via INTRAVENOUS
  Filled 2017-12-08: qty 2.2

## 2017-12-08 MED ORDER — DEXTROSE 5 % IV SOLN
Freq: Once | INTRAVENOUS | Status: AC
  Administered 2017-12-08: 12:00:00 via INTRAVENOUS
  Filled 2017-12-08: qty 1000

## 2017-12-08 MED ORDER — FLUOROURACIL CHEMO INJECTION 5 GM/100ML
2400.0000 mg/m2 | INTRAVENOUS | Status: DC
  Administered 2017-12-08: 5200 mg via INTRAVENOUS
  Filled 2017-12-08: qty 100

## 2017-12-08 MED ORDER — FLUOROURACIL CHEMO INJECTION 2.5 GM/50ML
400.0000 mg/m2 | Freq: Once | INTRAVENOUS | Status: AC
  Administered 2017-12-08: 850 mg via INTRAVENOUS
  Filled 2017-12-08: qty 17

## 2017-12-08 MED ORDER — LEUCOVORIN CALCIUM INJECTION 350 MG: 400.0000 mg/m2 | Freq: Once | INTRAVENOUS | Status: DC

## 2017-12-08 NOTE — Progress Notes (Signed)
Hematology/Oncology Follow Up Note [facility]  [phone] [phone]  Patient Care Team: Tonia Ghent, MD as PCP - General (Family Medicine)   Name of the patient: [name]  [id number]  19-Jan-1935   REASON FOR VISIT Follow up of chemotherapy management of esophageal adenocarcinoma  HISTORY OF PRESENT ILLNESS Patient is a 83 year old female who has metastatic esophageal cancer with liver involvement.  Weight loss 20 pounds for the past year prior to diagnosis.  # EGD during admission showed partially obstructive esophageal mass at the distal part of esophagus. Biopsy was taken. Pathology showed esophageal adenocarcinoma. #US biopsy of liver lesion to proof distant metastasis.  Report family history of breast cancer, but she has been having routine mammograms.  # Lives alone. She's a former smoker.  # Molecular Testing:  #[employer] Cdx: No reportable alternations with companion diagnostic (CDx) claims.  MS stable Tumor mutation burden: Cannot be determined, CCND3 amplification: Equivocal.  Amended reports on 06/04/2017, Tumor mutational burden changed from can not be determined to "TMB -low" on the re-analyzed samples.  # HER 2 negative.   Current Treatment S/p 6 cycles of FOLFOX, PET scan showed excellent partial response from both chemotherapy and radiation, result was discussed with patient.  currently on 5-FU maintenance.  S/p palliative radiation in January 2019 # Developed esophageal stricture secondary to radiation in April and got dilation via endoscopy. EGD shoed Benign-appearing esophageal stenosis. This is a result of radiation therapy.- LA Grade C radiation esophagitis. - The previously noted esophageal mass in Nov 2018 was much decreased indicating good response to radiation/chemotherapy. - Normal stomach.- Normal examined jejunum.- No specimens collected.    INTERVAL HISTORY  Patient presents for assessment  prior to maintenance 5-FU treatment. #Chemotherapy, she tolerates really well.  Denies nausea vomiting.  She has formed bowel movement every day. 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  She follows up with gastroenterology and have frequent EGD for dilation of the stricture.  She also takes Protonix 40 mg twice a day. Reports that last week she had been choking sensation and had a projectile vomiting.  Symptoms has resolved since then. #Chronic neuropathy, pre-existing.  Prior to exposure to oxaliplatin.  Currently off oxaliplatin only on 5-FU maintenance treatment.  She takes Lyrica for neuropathy.  Stable. #Dry skin on her hands.  Worsening lately not associated with any skin peeling, pain or swelling.  #Fatigue, stable   Review of systems Review of Systems  Constitutional: Positive for malaise/fatigue. Negative for chills, diaphoresis, fever and weight loss.  HENT: Negative for congestion, ear discharge, ear pain, hearing loss, nosebleeds, sinus pain and sore throat.   Eyes: Negative for double vision, photophobia, pain, discharge and redness.  Respiratory: Negative for cough, hemoptysis, shortness of breath and wheezing.   Cardiovascular: Negative for chest pain, palpitations, orthopnea, claudication and leg swelling.  Gastrointestinal: Negative for abdominal pain, blood in stool, constipation, diarrhea, heartburn, melena, nausea and vomiting.       Dysphagia  Genitourinary: Negative for dysuria, flank pain, frequency, hematuria and urgency.  Musculoskeletal: Negative for back pain, joint pain, myalgias and neck pain.  Skin: Negative for itching and rash.       Dry skin on her hands.  Neurological: Positive for tingling and sensory change. Negative for dizziness, tremors, focal weakness, weakness and headaches.  Endo/Heme/Allergies: Negative for environmental allergies. Does not bruise/bleed easily.  Psychiatric/Behavioral: Negative for depression,  hallucinations, substance abuse and suicidal ideas. The patient is not nervous/anxious.  Allergies  Allergen Reactions  . Cefuroxime Nausea Only  . Codeine Nausea Only  . Doxycycline Other (See Comments)    Lip swelling  . Gabapentin Other (See Comments)    Swelling.   . Iohexol Itching    07-15-13 pt developed itching on fingers after contrast given. Dr. Irish Elders looked at pt and said to put in system as allergy. BB  . Other Other (See Comments)    Contrast Dye- caused fever, chills, awful feeling Bandages - itching, rash  . Oxycodone Other (See Comments)    nausea  . Quinolones Swelling    Lip swelling  . Synvisc [Hylan G-F 20] Swelling     Past Medical History:  Diagnosis Date  . Arthritis   . Asthma   . COPD (chronic obstructive pulmonary disease) ([place])   . Dysphonia   . Dyspnea   . Esophageal cancer ([place])   . GERD (gastroesophageal reflux disease)   . Heart murmur   . History of kidney stones   . Hypertension   . Hypokalemia 10/07/2017  . Melanoma ([place])   . OAB (overactive bladder)   . OSA on CPAP   . 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 Personal history of chemotherapy    esophageal cancer  . Personal history of radiation therapy    esophageal cancer  . Pupil asymmetry    R pupil defect  . Skin cancer   . Sleep apnea   . Spinal cord abscess      Past Surgical History:  Procedure Laterality Date  . ABDOMINAL HYSTERECTOMY    . ANTERIOR CERVICAL DECOMP/DISCECTOMY FUSION N/A 07/16/2013   Procedure: ANTERIOR CERVICAL DECOMPRESSION/DISCECTOMY FUSION mutlipleLEVELS C4-7;  Surgeon: Erline Levine, MD;  Location: [facility];  Service: Neurosurgery;  Laterality: N/A;  . APPENDECTOMY    . carpel tunn Bilateral   . CATARACT EXTRACTION    . CATARACT EXTRACTION, BILATERAL    . CHOLECYSTECTOMY    . dental implant    . ESOPHAGOGASTRODUODENOSCOPY Left 03/21/2017   Procedure: ESOPHAGOGASTRODUODENOSCOPY (EGD);  Surgeon: Virgel Manifold, MD;   Location: [facility] ENDOSCOPY;  Service: Endoscopy;  Laterality: Left;  . ESOPHAGOGASTRODUODENOSCOPY (EGD) WITH PROPOFOL N/A 08/20/2017   Procedure: ESOPHAGOGASTRODUODENOSCOPY (EGD) WITH PROPOFOL;  Surgeon: Virgel Manifold, MD;  Location: [facility] ENDOSCOPY;  Service: Endoscopy;  Laterality: N/A;  . ESOPHAGOGASTRODUODENOSCOPY (EGD) WITH PROPOFOL N/A 09/19/2017   Procedure: ESOPHAGOGASTRODUODENOSCOPY (EGD) WITH PROPOFOL;  Surgeon: Virgel Manifold, MD;  Location: [facility] ENDOSCOPY;  Service: Endoscopy;  Laterality: N/A;  . ESOPHAGOGASTRODUODENOSCOPY (EGD) WITH PROPOFOL N/A 10/03/2017   Procedure: ESOPHAGOGASTRODUODENOSCOPY (EGD) WITH PROPOFOL;  Surgeon: Virgel Manifold, MD;  Location: [facility] ENDOSCOPY;  Service: Endoscopy;  Laterality: N/A;  . ESOPHAGOGASTRODUODENOSCOPY (EGD) WITH PROPOFOL N/A 10/22/2017   Procedure: ESOPHAGOGASTRODUODENOSCOPY (EGD) WITH PROPOFOL;  Surgeon: Virgel Manifold, MD;  Location: [facility] ENDOSCOPY;  Service: Endoscopy;  Laterality: N/A;  . ESOPHAGOGASTRODUODENOSCOPY (EGD) WITH PROPOFOL N/A 11/18/2017   Procedure: ESOPHAGOGASTRODUODENOSCOPY (EGD) WITH PROPOFOL;  Surgeon: Virgel Manifold, MD;  Location: [facility] ENDOSCOPY;  Service: Endoscopy;  Laterality: N/A;  . EYE SURGERY    . GASTRIC BYPASS    . HIATAL HERNIA REPAIR    . JOINT REPLACEMENT    . MELANOMA EXCISION    . PORTA CATH INSERTION N/A 04/07/2017   Procedure: PORTA CATH INSERTION;  Surgeon: Algernon Huxley, MD;  Location: [place] CV LAB;  Service: Cardiovascular;  Laterality: N/A;  . POSTERIOR CERVICAL FUSION/FORAMINOTOMY N/A 07/28/2013   Procedure: Cervical four-seven  posterior cervical fusion;  Surgeon: Erline Levine, MD;  Location: [place] [facility] ORS;  Service: Neurosurgery;  Laterality: N/A;  . TONSILLECTOMY    . TOTAL KNEE ARTHROPLASTY      Social History   Socioeconomic History  . Marital status: Widowed    Spouse name: Not on file  . Number of children: 2  . Years of education: Not on file  . Highest  education level: Master's degree (e.g., MA, MS, MEng, MEd, MSW, MBA)  Occupational History  . Not on file  Social Needs  . Financial resource strain: Not hard at all  . Food insecurity:    Worry: Never true    Inability: Never true  . Transportation needs:    Medical: No    Non-medical: No  Tobacco Use  . Smoking status: Former Smoker    Packs/day: 1.00    Years: 20.00    Pack years: 20.00    Types: Cigarettes    Last attempt to quit: 1977    Years since quitting: 42.6  . Smokeless tobacco: Never Used  Substance and Sexual Activity  . Alcohol use: No  . Drug use: No  . Sexual activity: Never  Lifestyle  . Physical activity:    Days per week: 2 days    Minutes per session: 30 min  . Stress: Not at all  Relationships  . Social connections:    Talks on phone: More than three times a week    Gets together: More than three times a week    Attends religious service: More than 4 times per year    Active member of club or organization: Yes    Attends meetings of clubs or organizations: More than 4 times per year    Relationship status: Widowed  . Intimate partner violence:    Fear of current or ex partner: No    Emotionally abused: No    Physically abused: No    Forced sexual activity: No  Other Topics Concern  . Not on file  Social History Narrative   Marital Status- Widowed    Lives by herself    Employement- Retired Pharmacist, hospital   Exercise hx- Does PT     Family History  Problem Relation Age of Onset  . Breast cancer Mother 61  . Arthritis-Osteo Mother   . Breast cancer Sister 49  . Bladder Cancer Sister   . Bladder Cancer Father   . Tongue cancer Father   . Congenital heart disease Father   . Prostate cancer Brother   . Uterine cancer Maternal Aunt   . Lung cancer Paternal Uncle   . Leukemia Maternal Grandmother   . Liver cancer Paternal Grandmother   . Colon cancer Neg Hx      Current Outpatient Medications:  .  acetaminophen (TYLENOL) 325 MG tablet, Take  650 mg by mouth every 6 (six) hours as needed for moderate pain or headache. , Disp: , Rfl:  .  ALBUTEROL SULFATE HFA IN, Inhale into the lungs. , Disp: , Rfl:  .  CARAFATE 1 GM/10ML suspension, TAKE 10MLS BY MOUTH FOUR TIMES DAILY, Disp: 420 mL, Rfl: 1 .  cyanocobalamin 500 MCG tablet, Take 500 mcg every other day by mouth., Disp: , Rfl:  .  Diclofenac Sodium 1 % CREA, Apply to left arm as needed for pain three times daily, Disp: 120 g, Rfl: 0 .  fluticasone (FLONASE) 50 MCG/ACT nasal spray, Place 1-2 sprays into both nostrils daily., Disp: 16 g, Rfl: 5 .  furosemide (LASIX) 20 MG tablet, Take 1 tablet (20 mg total) by mouth daily., Disp: 90 tablet, Rfl: 3 .  hydrocortisone cream 1 %, Apply 1 application topically daily as needed for itching. , Disp: , Rfl:  .  lidocaine-prilocaine (EMLA) cream, Apply to affected area once, Disp: 30 g, Rfl: 3 .  loperamide (IMODIUM) 2 MG capsule, Take 1 capsule (2 mg total) by mouth See admin instructions. With onset of loose stool, take 34m followed by 21mevery 2 hours until 12 hours have passed without loose bowel movement. Maximum: 16 mg/day, Disp: 120 capsule, Rfl: 0 .  loratadine (CLARITIN) 10 MG tablet, Take 10 mg by mouth daily., Disp: , Rfl:  .  LYRICA 150 MG capsule, TAKE 1 CAPSULE BY MOUTH TWO TIMES DAILY, Disp: 180 capsule, Rfl: 1 .  metoprolol succinate (TOPROL-XL) 50 MG 24 hr tablet, TAKE 1 TABLET BY MOUTH DAILY, Disp: 90 tablet, Rfl: 1 .  oxybutynin (DITROPAN) 5 MG tablet, TAKE 1 TABLET BY MOUTH EVERY MORNING AND1/2 TABLET AT BEDTIME, Disp: 135 tablet, Rfl: 0 .  pantoprazole (PROTONIX) 40 MG tablet, Take 40 mg by mouth daily., Disp: , Rfl:  .  Polyethyl Glycol-Propyl Glycol (SYSTANE OP), Apply 1 drop to eye daily as needed (dry eyes)., Disp: , Rfl:  .  potassium chloride (KLOR-CON) 20 MEQ packet, Take 20 mEq by mouth as needed (Daily as needed with fluid pill)., Disp: 30 packet, Rfl: 1 .  prochlorperazine (COMPAZINE) 10 MG tablet, Take 1 tablet (10  mg total) by mouth every 6 (six) hours as needed (Nausea or vomiting)., Disp: 30 tablet, Rfl: 1 .  pyridOXINE (VITAMIN B-6) 100 MG tablet, Take 1 tablet (100 mg total) by mouth daily., Disp: 30 tablet, Rfl: 3 .  ranitidine (ZANTAC) 150 MG/10ML syrup, Take 10 mLs (150 mg total) by mouth at bedtime., Disp: 300 mL, Rfl: 3 .  senna-docusate (SENOKOT-S) 8.6-50 MG tablet, Take 1 tablet at bedtime as needed by mouth for mild constipation., Disp: , Rfl:  .  traMADol (ULTRAM) 50 MG tablet, Take 1 tablet (50 mg total) by mouth every 6 (six) hours as needed., Disp: 30 tablet, Rfl: 0 .  traZODone (DESYREL) 50 MG tablet, TAKE 1/2 TABLET BY MOUTH AT BEDTIME IF NEEDED FOR SLEEP, Disp: 45 tablet, Rfl: 3 .  bisacodyl (DULCOLAX) 10 MG suppository, Place rectally., Disp: , Rfl:  .  calcium carbonate (TUMS EX) 750 MG chewable tablet, Chew by mouth., Disp: , Rfl:  .  Cholecalciferol (VITAMIN D) 2000 UNITS CAPS, Take 2,000 Units by mouth daily., Disp: , Rfl:  .  ondansetron (ZOFRAN) 8 MG tablet, Take 1 tablet (8 mg total) by mouth 2 (two) times daily as needed for refractory nausea / vomiting. Start on day 3 after chemotherapy. (Patient not taking: Reported on 12/08/2017), Disp: 30 tablet, Rfl: 1 .  pantoprazole (PROTONIX) 40 MG tablet, Take 1 tablet (40 mg total) by mouth 2 (two) times daily before a meal., Disp: 60 tablet, Rfl: 1 .  Pediatric Multiple Vitamins (FLINTSTONES MULTIVITAMIN PO), Take by mouth., Disp: , Rfl:   Physical exam:  Vitals:   12/08/17 1058  BP: 112/70  Pulse: 69  Resp: 18  Temp: (!) 97.5 F (36.4 C)  TempSrc: Tympanic  Weight: 205 lb 6.4 oz (93.2 kg)  ECOG 1 Physical Exam  Constitutional: She is oriented to person, place, and time and well-developed, well-nourished, and in no distress. No distress.  HENT:  Head: Normocephalic and atraumatic.  Nose: Nose normal.  Mouth/Throat: Oropharynx is  clear and moist. No oropharyngeal exudate.  Eyes: Pupils are equal, round, and reactive to light.  Conjunctivae and EOM are normal. Left eye exhibits no discharge. No scleral icterus.  Neck: Normal range of motion. Neck supple. No JVD present.  Cardiovascular: Normal rate and regular rhythm.  Murmur heard. Pulmonary/Chest: Effort normal and breath sounds normal. No respiratory distress. She has no wheezes. She has no rales. She exhibits no tenderness.  Abdominal: Soft. Bowel sounds are normal. She exhibits no distension and no mass. There is no tenderness. There is no rebound and no guarding.  Musculoskeletal: Normal range of motion. She exhibits no edema, tenderness or deformity.  Lymphadenopathy:    She has no cervical adenopathy.  Neurological: She is alert and oriented to person, place, and time. No cranial nerve deficit. She exhibits normal muscle tone. Coordination normal.  Skin: Skin is warm and dry. She is not diaphoretic. No erythema.  Dry skin bilateral hands.  no skin peeling.  Psychiatric: Affect and judgment normal.    CMP Latest Ref Rng & Units 12/08/2017  Glucose 70 - 99 mg/dL 118(H)  BUN 8 - 23 mg/dL 16  Creatinine 0.44 - 1.00 mg/dL 0.90  Sodium 135 - 145 mmol/L 142  Potassium 3.5 - 5.1 mmol/L 3.6  Chloride 98 - 111 mmol/L 104  CO2 22 - 32 mmol/L 26  Calcium 8.9 - 10.3 mg/dL 8.8(L)  Total Protein 6.5 - 8.1 g/dL 6.8  Total Bilirubin 0.3 - 1.2 mg/dL 0.7  Alkaline Phos 38 - 126 U/L 82  AST 15 - 41 U/L 36  ALT 0 - 44 U/L 14   CBC Latest Ref Rng & Units 12/08/2017  WBC 3.6 - 11.0 K/uL 4.5  Hemoglobin 12.0 - 16.0 g/dL 12.7  Hematocrit 35.0 - 47.0 % 37.6  Platelets 150 - 440 K/uL 163     Assessment and plan  Cancer Staging Malignant neoplasm of lower third of esophagus (HCC) Staging form: Esophagus - Adenocarcinoma, AJCC 8th Edition - Clinical stage from 04/10/2017: Stage IVB (cTX, cNX, pM1) - Signed by Earlie Server, MD on 04/10/2017  1. Encounter for antineoplastic chemotherapy   2. Esophageal stricture   3. Neuropathy   4. Palmar plantar erythrodysesthesia     #Esophageal cancer, last PET scan showed complete remission. Tolerates chemotherapy well  Labs reviewed and discussed with patient.  Acceptable to proceed today's 5-FU maintenance treatment.   #Esophageal stricture: Recurrent issue for patient.  Secondary to radiation.  Continue follow-up with gastroenterology for dilation via EGD and steroid injection.  # Iron deficiency anemia: Continue monitor.  Stable.  Continue monitor. # palmar-plantar erythrodysesthesia, Grade 1.  Advise patient to apply [place] Cream to both hands and bottom of feet.  # Chronic neuropathy, continue Lyrica.   Follow up in 2 weeks for 5- FU treatment.    Patient knows to call if any concerns or questions.  Orders Placed This Encounter  Procedures  . CBC with Differential/Platelet    Standing Status:   Future    Standing Expiration Date:   12/09/2018  . Comprehensive metabolic panel    Standing Status:   Future    Standing Expiration Date:   12/09/2018  . CEA    Standing Status:   Future    Standing Expiration Date:   12/09/2018    Earlie Server, MD, PhD Hematology Oncology [facility] Pager- [phone] 12/08/17

## 2017-12-08 NOTE — Progress Notes (Signed)
Patient here for follow up. No concerns voiced today.  

## 2017-12-10 ENCOUNTER — Inpatient Hospital Stay: Payer: Medicare Other

## 2017-12-10 VITALS — BP 140/74 | HR 78 | Temp 97.2°F | Resp 16

## 2017-12-10 DIAGNOSIS — C155 Malignant neoplasm of lower third of esophagus: Secondary | ICD-10-CM | POA: Diagnosis not present

## 2017-12-10 MED ORDER — SODIUM CHLORIDE 0.9% FLUSH
10.0000 mL | INTRAVENOUS | Status: DC | PRN
  Administered 2017-12-10: 10 mL
  Filled 2017-12-10: qty 10

## 2017-12-10 MED ORDER — HEPARIN SOD (PORK) LOCK FLUSH 100 UNIT/ML IV SOLN
500.0000 [IU] | Freq: Once | INTRAVENOUS | Status: AC | PRN
  Administered 2017-12-10: 500 [IU]
  Filled 2017-12-10: qty 5

## 2017-12-15 ENCOUNTER — Telehealth: Payer: Self-pay | Admitting: Gastroenterology

## 2017-12-15 ENCOUNTER — Encounter: Payer: Self-pay | Admitting: Oncology

## 2017-12-15 ENCOUNTER — Telehealth: Payer: Self-pay | Admitting: *Deleted

## 2017-12-15 ENCOUNTER — Other Ambulatory Visit: Payer: Self-pay

## 2017-12-15 ENCOUNTER — Inpatient Hospital Stay: Payer: Medicare Other | Attending: Oncology

## 2017-12-15 ENCOUNTER — Inpatient Hospital Stay (HOSPITAL_BASED_OUTPATIENT_CLINIC_OR_DEPARTMENT_OTHER): Payer: Medicare Other | Admitting: Oncology

## 2017-12-15 VITALS — BP 118/63 | HR 56 | Temp 97.8°F | Resp 18 | Wt 205.0 lb

## 2017-12-15 DIAGNOSIS — K1230 Oral mucositis (ulcerative), unspecified: Secondary | ICD-10-CM | POA: Insufficient documentation

## 2017-12-15 DIAGNOSIS — Z923 Personal history of irradiation: Secondary | ICD-10-CM | POA: Insufficient documentation

## 2017-12-15 DIAGNOSIS — Z803 Family history of malignant neoplasm of breast: Secondary | ICD-10-CM | POA: Diagnosis not present

## 2017-12-15 DIAGNOSIS — Z5111 Encounter for antineoplastic chemotherapy: Secondary | ICD-10-CM | POA: Insufficient documentation

## 2017-12-15 DIAGNOSIS — K222 Esophageal obstruction: Secondary | ICD-10-CM | POA: Insufficient documentation

## 2017-12-15 DIAGNOSIS — Z862 Personal history of diseases of the blood and blood-forming organs and certain disorders involving the immune mechanism: Secondary | ICD-10-CM | POA: Diagnosis not present

## 2017-12-15 DIAGNOSIS — E876 Hypokalemia: Secondary | ICD-10-CM | POA: Diagnosis not present

## 2017-12-15 DIAGNOSIS — Z5189 Encounter for other specified aftercare: Secondary | ICD-10-CM

## 2017-12-15 DIAGNOSIS — K121 Other forms of stomatitis: Secondary | ICD-10-CM

## 2017-12-15 DIAGNOSIS — G629 Polyneuropathy, unspecified: Secondary | ICD-10-CM | POA: Diagnosis not present

## 2017-12-15 DIAGNOSIS — C155 Malignant neoplasm of lower third of esophagus: Secondary | ICD-10-CM | POA: Diagnosis present

## 2017-12-15 DIAGNOSIS — Z87891 Personal history of nicotine dependence: Secondary | ICD-10-CM | POA: Insufficient documentation

## 2017-12-15 LAB — COMPREHENSIVE METABOLIC PANEL
ALT: 15 U/L (ref 0–44)
AST: 32 U/L (ref 15–41)
Albumin: 3.6 g/dL (ref 3.5–5.0)
Alkaline Phosphatase: 76 U/L (ref 38–126)
Anion gap: 8 (ref 5–15)
BUN: 17 mg/dL (ref 8–23)
CHLORIDE: 104 mmol/L (ref 98–111)
CO2: 29 mmol/L (ref 22–32)
CREATININE: 1.03 mg/dL — AB (ref 0.44–1.00)
Calcium: 9 mg/dL (ref 8.9–10.3)
GFR calc Af Amer: 57 mL/min — ABNORMAL LOW (ref >60)
GFR, EST NON AFRICAN AMERICAN: 50 mL/min — AB (ref >60)
Glucose, Bld: 101 mg/dL — ABNORMAL HIGH (ref 70–99)
Potassium: 4.3 mmol/L (ref 3.5–5.1)
SODIUM: 141 mmol/L (ref 135–145)
Total Bilirubin: 0.4 mg/dL (ref 0.3–1.2)
Total Protein: 6.5 g/dL (ref 6.5–8.1)

## 2017-12-15 LAB — CBC WITH DIFFERENTIAL/PLATELET
Basophils Absolute: 0 10*3/uL (ref 0–0.1)
Basophils Relative: 1 %
EOS ABS: 0.1 10*3/uL (ref 0–0.7)
EOS PCT: 4 %
HCT: 36.8 % (ref 35.0–47.0)
HEMOGLOBIN: 12.3 g/dL (ref 12.0–16.0)
LYMPHS ABS: 0.5 10*3/uL — AB (ref 1.0–3.6)
Lymphocytes Relative: 12 %
MCH: 33.7 pg (ref 26.0–34.0)
MCHC: 33.5 g/dL (ref 32.0–36.0)
MCV: 100.7 fL — ABNORMAL HIGH (ref 80.0–100.0)
MONOS PCT: 10 %
Monocytes Absolute: 0.4 10*3/uL (ref 0.2–0.9)
NEUTROS PCT: 73 %
Neutro Abs: 2.8 10*3/uL (ref 1.4–6.5)
Platelets: 184 10*3/uL (ref 150–440)
RBC: 3.65 MIL/uL — AB (ref 3.80–5.20)
RDW: 15 % — ABNORMAL HIGH (ref 11.5–14.5)
WBC: 3.9 10*3/uL (ref 3.6–11.0)

## 2017-12-15 MED ORDER — ACYCLOVIR 400 MG PO TABS: 400.0000 mg | ORAL_TABLET | Freq: Two times a day (BID) | ORAL | 0 refills | Status: DC

## 2017-12-15 NOTE — Telephone Encounter (Signed)
Per Dr Tasia Catchings, patient should be seen ad evaluated and have labs. Patient accepts appointment and is on her way now

## 2017-12-15 NOTE — Telephone Encounter (Addendum)
Patient called to report that she had chemotherapy a week ago with 5 FU and that her mouth is coated in blisters making it difficult to eat and drink/ She is asking for medicine for it. Please advise

## 2017-12-15 NOTE — Progress Notes (Signed)
Symptom Management Consult note [facility]  [phone] [phone]  Patient Care Team: Tonia Ghent, MD as PCP - General (Family Medicine)   Name of the patient: [name]  [id number]  10-14-1934   Date of visit: 12/16/17  Diagnosis-esophageal adenocarcinoma  Chief complaint/ Reason for visit- mouth sores  Heme/Onc history: Patient last seen by Dr. Tasia Catchings on 12/08/2017 for assessment prior to maintenance 5-FU.  She continued to complain of dysphasia due to radiation stricture.  Patient did complain of one remote choking sensation with projectile vomiting that resolved spontaneously.  Complained of chronic neuropathy.  Continues Lyrica.  Admitted to dry skin on her hands but denied skin peeling, pain or swelling. Oncology History   Patient is a 83 year old female who has metastatic esophageal cancer with liver involvement.  Weight loss 20 pounds for the past year prior to diagnosis.  #EGD during admission showed partially obstructive esophageal mass at the distal part of esophagus. Biopsy was taken. Pathology showed esophageal adenocarcinoma. #US biopsy of liver lesion to proof distant metastasis.  Report family history of breast cancer,but she has been having routine mammograms.  # Lives alone.She's a former smoker.  # Molecular Testing:  #[employer] Cdx: No reportable alternations with companion diagnostic (CDx) claims.  MS stable Tumor mutation burden: Cannot be determined, CCND3 amplification: Equivocal.  Amended reports on 06/04/2017, Tumor mutational burden changed from can not be determined to "TMB -low" on the re-analyzed samples.  # HER 2 negative.   Current Treatment S/p 6 cycles of FOLFOX, PET scan showed excellent partial response from both chemotherapy and radiation, result was discussed with patient.  currently on 5-FU maintenance.  S/p palliative radiation in January 2019 # Developed esophageal stricture  secondary to radiation in April and got dilation via endoscopy. EGD shoed Benign-appearing esophageal stenosis. This is a result of radiation therapy.- LA Grade C radiation esophagitis. - The previously noted esophageal mass in Nov 2018 was much decreased indicating good response to radiation/chemotherapy. - Normal stomach.- Normal examined jejunum.- No specimens collected.        Esophageal adenocarcinoma ([place])    Initial Diagnosis    Esophageal adenocarcinoma ([place])       Interval History:  Patient returns to clinic today for painful/irritating mouth sores. Patient noticed mouth sores approximately 4 days ago. She has not tried anything for her mouth sores. Denies worsening dysphagia. Associated symptoms are sensitivity and pain to cold or hot foods/drinks.  No exacerbating or alleviating factors. Continues to brush teeth and provide oral hygiene.  Most recent maintenance 5-FU given on 12/08/2017.   ECOG FS:1 - Symptomatic but completely ambulatory  Review of systems- Review of Systems  Constitutional: Positive for malaise/fatigue. Negative for chills, fever and weight loss.  HENT: Negative for congestion and ear pain.        Mouth sores  Eyes: Negative.  Negative for blurred vision and double vision.  Respiratory: Negative.  Negative for cough, sputum production and shortness of breath.   Cardiovascular: Negative.  Negative for chest pain, palpitations and leg swelling.  Gastrointestinal: Negative.  Negative for abdominal pain, constipation, diarrhea, nausea and vomiting.  Genitourinary: Negative for dysuria, frequency and urgency.  Musculoskeletal: Negative for back pain and falls.  Skin: Negative.  Negative for rash.  Neurological: Negative.  Negative for weakness and headaches.  Endo/Heme/Allergies: Negative.  Does not bruise/bleed easily.  Psychiatric/Behavioral: Negative.  Negative for depression. The patient is not nervous/anxious and does not have insomnia.  Current treatment-maintenance 5-FU last given on 12/08/2017  Allergies  Allergen Reactions  . Cefuroxime Nausea Only  . Codeine Nausea Only  . Doxycycline Other (See Comments)    Lip swelling  . Gabapentin Other (See Comments)    Swelling.   . Iohexol Itching    07-15-13 pt developed itching on fingers after contrast given. Dr. Irish Elders looked at pt and said to put in system as allergy. BB  . Other Other (See Comments)    Contrast Dye- caused fever, chills, awful feeling Bandages - itching, rash  . Oxycodone Other (See Comments)    nausea  . Quinolones Swelling    Lip swelling  . Synvisc [Hylan G-F 20] Swelling     Past Medical History:  Diagnosis Date  . Arthritis   . Asthma   . COPD (chronic obstructive pulmonary disease) ([place])   . Dysphonia   . Dyspnea   . Esophageal cancer ([place])   . GERD (gastroesophageal reflux disease)   . Heart murmur   . History of kidney stones   . Hypertension   . Hypokalemia 10/07/2017  . Melanoma ([place])   . OAB (overactive bladder)   . OSA on CPAP   . 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 Personal history of chemotherapy    esophageal cancer  . Personal history of radiation therapy    esophageal cancer  . Pupil asymmetry    R pupil defect  . Skin cancer   . Sleep apnea   . Spinal cord abscess      Past Surgical History:  Procedure Laterality Date  . ABDOMINAL HYSTERECTOMY    . ANTERIOR CERVICAL DECOMP/DISCECTOMY FUSION N/A 07/16/2013   Procedure: ANTERIOR CERVICAL DECOMPRESSION/DISCECTOMY FUSION mutlipleLEVELS C4-7;  Surgeon: Erline Levine, MD;  Location: [facility]URO [facility]S;  Service: Neurosurgery;  Laterality: N/A;  . APPENDECTOMY    . carpel tunn Bilateral   . CATARACT EXTRACTION    . CATARACT EXTRACTION, BILATERAL    . CHOLECYSTECTOMY    . dental implant    . ESOPHAGOGASTRODUODENOSCOPY Left 03/21/2017   Procedure: ESOPHAGOGASTRODUODENOSCOPY (EGD);  Surgeon: Virgel Manifold, MD;  Location: [facility] ENDOSCOPY;  Service:  Endoscopy;  Laterality: Left;  . ESOPHAGOGASTRODUODENOSCOPY (EGD) WITH PROPOFOL N/A 08/20/2017   Procedure: ESOPHAGOGASTRODUODENOSCOPY (EGD) WITH PROPOFOL;  Surgeon: Virgel Manifold, MD;  Location: [facility] ENDOSCOPY;  Service: Endoscopy;  Laterality: N/A;  . ESOPHAGOGASTRODUODENOSCOPY (EGD) WITH PROPOFOL N/A 09/19/2017   Procedure: ESOPHAGOGASTRODUODENOSCOPY (EGD) WITH PROPOFOL;  Surgeon: Virgel Manifold, MD;  Location: [facility] ENDOSCOPY;  Service: Endoscopy;  Laterality: N/A;  . ESOPHAGOGASTRODUODENOSCOPY (EGD) WITH PROPOFOL N/A 10/03/2017   Procedure: ESOPHAGOGASTRODUODENOSCOPY (EGD) WITH PROPOFOL;  Surgeon: Virgel Manifold, MD;  Location: [facility] ENDOSCOPY;  Service: Endoscopy;  Laterality: N/A;  . ESOPHAGOGASTRODUODENOSCOPY (EGD) WITH PROPOFOL N/A 10/22/2017   Procedure: ESOPHAGOGASTRODUODENOSCOPY (EGD) WITH PROPOFOL;  Surgeon: Virgel Manifold, MD;  Location: [facility] ENDOSCOPY;  Service: Endoscopy;  Laterality: N/A;  . ESOPHAGOGASTRODUODENOSCOPY (EGD) WITH PROPOFOL N/A 11/18/2017   Procedure: ESOPHAGOGASTRODUODENOSCOPY (EGD) WITH PROPOFOL;  Surgeon: Virgel Manifold, MD;  Location: [facility] ENDOSCOPY;  Service: Endoscopy;  Laterality: N/A;  . EYE SURGERY    . GASTRIC BYPASS    . HIATAL HERNIA REPAIR    . JOINT REPLACEMENT    . MELANOMA EXCISION    . PORTA CATH INSERTION N/A 04/07/2017   Procedure: PORTA CATH INSERTION;  Surgeon: Algernon Huxley, MD;  Location: [place] CV LAB;  Service: Cardiovascular;  Laterality: N/A;  . POSTERIOR CERVICAL FUSION/FORAMINOTOMY  N/A 07/28/2013   Procedure: Cervical four-seven  posterior cervical fusion;  Surgeon: Erline Levine, MD;  Location: [place] NEURO ORS;  Service: Neurosurgery;  Laterality: N/A;  . TONSILLECTOMY    . TOTAL KNEE ARTHROPLASTY      Social History   Socioeconomic History  . Marital status: Widowed    Spouse name: Not on file  . Number of children: 2  . Years of education: Not on file  . Highest education level: Master's degree  (e.g., MA, MS, MEng, MEd, MSW, MBA)  Occupational History  . Not on file  Social Needs  . Financial resource strain: Not hard at all  . Food insecurity:    Worry: Never true    Inability: Never true  . Transportation needs:    Medical: No    Non-medical: No  Tobacco Use  . Smoking status: Former Smoker    Packs/day: 1.00    Years: 20.00    Pack years: 20.00    Types: Cigarettes    Last attempt to quit: 1977    Years since quitting: 42.6  . Smokeless tobacco: Never Used  Substance and Sexual Activity  . Alcohol use: No  . Drug use: No  . Sexual activity: Never  Lifestyle  . Physical activity:    Days per week: 2 days    Minutes per session: 30 min  . Stress: Not at all  Relationships  . Social connections:    Talks on phone: More than three times a week    Gets together: More than three times a week    Attends religious service: More than 4 times per year    Active member of club or organization: Yes    Attends meetings of clubs or organizations: More than 4 times per year    Relationship status: Widowed  . Intimate partner violence:    Fear of current or ex partner: No    Emotionally abused: No    Physically abused: No    Forced sexual activity: No  Other Topics Concern  . Not on file  Social History Narrative   Marital Status- Widowed    Lives by herself    Employement- Retired Pharmacist, hospital   Exercise hx- Does PT     Family History  Problem Relation Age of Onset  . Breast cancer Mother 29  . Arthritis-Osteo Mother   . Breast cancer Sister 67  . Bladder Cancer Sister   . Bladder Cancer Father   . Tongue cancer Father   . Congenital heart disease Father   . Prostate cancer Brother   . Uterine cancer Maternal Aunt   . Lung cancer Paternal Uncle   . Leukemia Maternal Grandmother   . Liver cancer Paternal Grandmother   . Colon cancer Neg Hx      Current Outpatient Medications:  .  acetaminophen (TYLENOL) 325 MG tablet, Take 650 mg by mouth every 6 (six)  hours as needed for moderate pain or headache. , Disp: , Rfl:  .  ALBUTEROL SULFATE HFA IN, Inhale into the lungs. , Disp: , Rfl:  .  bisacodyl (DULCOLAX) 10 MG suppository, Place rectally., Disp: , Rfl:  .  calcium carbonate (TUMS EX) 750 MG chewable tablet, Chew by mouth., Disp: , Rfl:  .  CARAFATE 1 GM/10ML suspension, TAKE 10MLS BY MOUTH FOUR TIMES DAILY, Disp: 420 mL, Rfl: 1 .  Cholecalciferol (VITAMIN D) 2000 UNITS CAPS, Take 2,000 Units by mouth daily., Disp: , Rfl:  .  cyanocobalamin 500 MCG tablet, Take  500 mcg every other day by mouth., Disp: , Rfl:  .  Diclofenac Sodium 1 % CREA, Apply to left arm as needed for pain three times daily, Disp: 120 g, Rfl: 0 .  fluticasone (FLONASE) 50 MCG/ACT nasal spray, Place 1-2 sprays into both nostrils daily., Disp: 16 g, Rfl: 5 .  furosemide (LASIX) 20 MG tablet, Take 1 tablet (20 mg total) by mouth daily., Disp: 90 tablet, Rfl: 3 .  hydrocortisone cream 1 %, Apply 1 application topically daily as needed for itching. , Disp: , Rfl:  .  lidocaine-prilocaine (EMLA) cream, Apply to affected area once, Disp: 30 g, Rfl: 3 .  loratadine (CLARITIN) 10 MG tablet, Take 10 mg by mouth daily., Disp: , Rfl:  .  LYRICA 150 MG capsule, TAKE 1 CAPSULE BY MOUTH TWO TIMES DAILY, Disp: 180 capsule, Rfl: 1 .  metoprolol succinate (TOPROL-XL) 50 MG 24 hr tablet, TAKE 1 TABLET BY MOUTH DAILY, Disp: 90 tablet, Rfl: 1 .  oxybutynin (DITROPAN) 5 MG tablet, TAKE 1 TABLET BY MOUTH EVERY MORNING AND1/2 TABLET AT BEDTIME, Disp: 135 tablet, Rfl: 0 .  pantoprazole (PROTONIX) 40 MG tablet, Take 1 tablet (40 mg total) by mouth 2 (two) times daily before a meal., Disp: 60 tablet, Rfl: 1 .  pantoprazole (PROTONIX) 40 MG tablet, Take 40 mg by mouth daily., Disp: , Rfl:  .  Pediatric Multiple Vitamins (FLINTSTONES MULTIVITAMIN PO), Take by mouth., Disp: , Rfl:  .  Polyethyl Glycol-Propyl Glycol (SYSTANE OP), Apply 1 drop to eye daily as needed (dry eyes)., Disp: , Rfl:  .  potassium  chloride (KLOR-CON) 20 MEQ packet, Take 20 mEq by mouth as needed (Daily as needed with fluid pill)., Disp: 30 packet, Rfl: 1 .  prochlorperazine (COMPAZINE) 10 MG tablet, Take 1 tablet (10 mg total) by mouth every 6 (six) hours as needed (Nausea or vomiting)., Disp: 30 tablet, Rfl: 1 .  pyridOXINE (VITAMIN B-6) 100 MG tablet, Take 1 tablet (100 mg total) by mouth daily., Disp: 30 tablet, Rfl: 3 .  ranitidine (ZANTAC) 150 MG/10ML syrup, Take 10 mLs (150 mg total) by mouth at bedtime., Disp: 300 mL, Rfl: 3 .  senna-docusate (SENOKOT-S) 8.6-50 MG tablet, Take 1 tablet at bedtime as needed by mouth for mild constipation., Disp: , Rfl:  .  traMADol (ULTRAM) 50 MG tablet, Take 1 tablet (50 mg total) by mouth every 6 (six) hours as needed., Disp: 30 tablet, Rfl: 0 .  traZODone (DESYREL) 50 MG tablet, TAKE 1/2 TABLET BY MOUTH AT BEDTIME IF NEEDED FOR SLEEP, Disp: 45 tablet, Rfl: 3 .  acyclovir (ZOVIRAX) 400 MG tablet, Take 1 tablet (400 mg total) by mouth 2 (two) times daily., Disp: 30 tablet, Rfl: 0 .  ondansetron (ZOFRAN) 8 MG tablet, Take 1 tablet (8 mg total) by mouth 2 (two) times daily as needed for refractory nausea / vomiting. Start on day 3 after chemotherapy. (Patient not taking: Reported on 12/08/2017), Disp: 30 tablet, Rfl: 1  Physical exam:  Vitals:   12/15/17 1551 12/15/17 1600  BP: 116/71 118/63  Pulse: (!) 56 (!) 56  Resp: 18   Temp: 97.8 F (36.6 C)   TempSrc: Tympanic   Weight: 205 lb (93 kg)    Physical Exam  Constitutional: She is oriented to person, place, and time. Vital signs are normal.  HENT:  Head: Normocephalic and atraumatic.  Mouth/Throat: Mucous membranes are normal. Oral lesions present.    Eyes: Pupils are equal, round, and reactive to light.  Neck:  Normal range of motion.  Cardiovascular: Normal rate, regular rhythm and normal heart sounds.  No murmur heard. Pulmonary/Chest: Effort normal and breath sounds normal. She has no wheezes.  Abdominal: Soft. Normal  appearance and bowel sounds are normal. She exhibits no distension. There is no tenderness.  Musculoskeletal: Normal range of motion. She exhibits no edema.  Neurological: She is alert and oriented to person, place, and time.  Skin: Skin is warm and dry. No rash noted.  Psychiatric: Judgment normal.     CMP Latest Ref Rng & Units 12/15/2017  Glucose 70 - 99 mg/dL 101(H)  BUN 8 - 23 mg/dL 17  Creatinine 0.44 - 1.00 mg/dL 1.03(H)  Sodium 135 - 145 mmol/L 141  Potassium 3.5 - 5.1 mmol/L 4.3  Chloride 98 - 111 mmol/L 104  CO2 22 - 32 mmol/L 29  Calcium 8.9 - 10.3 mg/dL 9.0  Total Protein 6.5 - 8.1 g/dL 6.5  Total Bilirubin 0.3 - 1.2 mg/dL 0.4  Alkaline Phos 38 - 126 U/L 76  AST 15 - 41 U/L 32  ALT 0 - 44 U/L 15   CBC Latest Ref Rng & Units 12/15/2017  WBC 3.6 - 11.0 K/uL 3.9  Hemoglobin 12.0 - 16.0 g/dL 12.3  Hematocrit 35.0 - 47.0 % 36.8  Platelets 150 - 440 K/uL 184    No images are attached to the encounter.  No results found.  Assessment and plan- Patient is a 83 y.o. female who presents for oral lesions x4 days. Oral leisons present on tounge and left buccal mucosa.   1.  Esophageal adenocarcinoma: s/p 6 cycles of FOLFOX (07/01/17).  Had PET scan (10/15/17) showing excellent partial response from both chemo and radiation.  Status post palliative radiation in January 2019.  Began 5-FU plus leucovorin in March 2019.  Tolerating maintenance 5-FU well.  Scheduled to return to clinic on 12/22/2017 for labs, MD assessment and continued 5-FU maintenance.   2. Grade 2 oral Mucocitis: Secondary to maintenance 5-FU. RX Magic mouthwash with lidocaine.  Has history of esophagitis d/t radaition in Jan.  Currently on Carafate.  Will give prophylactic antiviral therapy as well. RX acyclovir 400 mg BID.  Encouraged her to keep her mouth adequately hydrated and clean.  The use of Biotene mouthwash has shown to reduce the risk of secondary infections d/t oral lesions.  Patient in agreement with plan.     Visit Diagnosis 1. Stomatitis and mucositis   2. Malignant neoplasm of lower third of esophagus [facility])     Patient expressed understanding and was in agreement with this plan. She also understands that She can call clinic at any time with any questions, concerns, or complaints.   Greater than 50% was spent in counseling and coordination of care with this patient including but not limited to discussion of the relevant topics above (See A&P) including, but not limited to diagnosis and management of acute and chronic medical conditions.    Faythe Casa, AGNP-C [facility] at [place]- [phone] Pager- [phone] 12/16/2017 12:30 PM

## 2017-12-15 NOTE — Telephone Encounter (Signed)
Pt has procedure 8/7 and has a sore throat she thinks it might be from [facility] please call pt to advise how to soothen her throat

## 2017-12-16 NOTE — Telephone Encounter (Signed)
Pt better today, vomiting x1 (projectile) yesterday and has been better since. At the time she had a little choking "spell" but not like before. No sore throat.

## 2017-12-17 ENCOUNTER — Encounter
Admission: RE | Disposition: A | Discharge status: Home / Self Care | Payer: Self-pay | Source: Ambulatory Visit | Attending: Gastroenterology

## 2017-12-17 ENCOUNTER — Ambulatory Visit: Payer: Medicare Other | Admitting: Registered Nurse

## 2017-12-17 ENCOUNTER — Ambulatory Visit
Admission: RE | Admit: 2017-12-17 | Discharge: 2017-12-17 | Disposition: A | Discharge status: Home / Self Care | Payer: Medicare Other | Source: Ambulatory Visit | Attending: Gastroenterology | Admitting: Gastroenterology

## 2017-12-17 ENCOUNTER — Encounter: Payer: Self-pay | Admitting: Gastroenterology

## 2017-12-17 DIAGNOSIS — E876 Hypokalemia: Secondary | ICD-10-CM | POA: Diagnosis not present

## 2017-12-17 DIAGNOSIS — I739 Peripheral vascular disease, unspecified: Secondary | ICD-10-CM | POA: Insufficient documentation

## 2017-12-17 DIAGNOSIS — I10 Essential (primary) hypertension: Secondary | ICD-10-CM | POA: Insufficient documentation

## 2017-12-17 DIAGNOSIS — G4733 Obstructive sleep apnea (adult) (pediatric): Secondary | ICD-10-CM | POA: Insufficient documentation

## 2017-12-17 DIAGNOSIS — Z981 Arthrodesis status: Secondary | ICD-10-CM | POA: Insufficient documentation

## 2017-12-17 DIAGNOSIS — Z8501 Personal history of malignant neoplasm of esophagus: Secondary | ICD-10-CM | POA: Insufficient documentation

## 2017-12-17 DIAGNOSIS — Z79899 Other long term (current) drug therapy: Secondary | ICD-10-CM | POA: Diagnosis not present

## 2017-12-17 DIAGNOSIS — Z8711 Personal history of peptic ulcer disease: Secondary | ICD-10-CM | POA: Diagnosis not present

## 2017-12-17 DIAGNOSIS — Z888 Allergy status to other drugs, medicaments and biological substances status: Secondary | ICD-10-CM | POA: Diagnosis not present

## 2017-12-17 DIAGNOSIS — Z885 Allergy status to narcotic agent status: Secondary | ICD-10-CM | POA: Insufficient documentation

## 2017-12-17 DIAGNOSIS — R1310 Dysphagia, unspecified: Secondary | ICD-10-CM | POA: Diagnosis not present

## 2017-12-17 DIAGNOSIS — Z9884 Bariatric surgery status: Secondary | ICD-10-CM | POA: Diagnosis not present

## 2017-12-17 DIAGNOSIS — Z791 Long term (current) use of non-steroidal anti-inflammatories (NSAID): Secondary | ICD-10-CM | POA: Insufficient documentation

## 2017-12-17 DIAGNOSIS — K219 Gastro-esophageal reflux disease without esophagitis: Secondary | ICD-10-CM | POA: Diagnosis not present

## 2017-12-17 DIAGNOSIS — K222 Esophageal obstruction: Secondary | ICD-10-CM | POA: Insufficient documentation

## 2017-12-17 DIAGNOSIS — Z881 Allergy status to other antibiotic agents status: Secondary | ICD-10-CM | POA: Diagnosis not present

## 2017-12-17 DIAGNOSIS — J449 Chronic obstructive pulmonary disease, unspecified: Secondary | ICD-10-CM | POA: Insufficient documentation

## 2017-12-17 DIAGNOSIS — Z87891 Personal history of nicotine dependence: Secondary | ICD-10-CM | POA: Insufficient documentation

## 2017-12-17 DIAGNOSIS — Z7951 Long term (current) use of inhaled steroids: Secondary | ICD-10-CM | POA: Diagnosis not present

## 2017-12-17 DIAGNOSIS — Z9221 Personal history of antineoplastic chemotherapy: Secondary | ICD-10-CM | POA: Insufficient documentation

## 2017-12-17 DIAGNOSIS — I2510 Atherosclerotic heart disease of native coronary artery without angina pectoris: Secondary | ICD-10-CM | POA: Diagnosis not present

## 2017-12-17 DIAGNOSIS — Z6835 Body mass index (BMI) 35.0-35.9, adult: Secondary | ICD-10-CM | POA: Diagnosis not present

## 2017-12-17 DIAGNOSIS — Z9842 Cataract extraction status, left eye: Secondary | ICD-10-CM | POA: Insufficient documentation

## 2017-12-17 DIAGNOSIS — Z85828 Personal history of other malignant neoplasm of skin: Secondary | ICD-10-CM | POA: Diagnosis not present

## 2017-12-17 DIAGNOSIS — G4730 Sleep apnea, unspecified: Secondary | ICD-10-CM | POA: Insufficient documentation

## 2017-12-17 DIAGNOSIS — Z9841 Cataract extraction status, right eye: Secondary | ICD-10-CM | POA: Insufficient documentation

## 2017-12-17 HISTORY — PX: ESOPHAGOGASTRODUODENOSCOPY (EGD) WITH PROPOFOL: SHX5813

## 2017-12-17 SURGERY — ESOPHAGOGASTRODUODENOSCOPY (EGD) WITH PROPOFOL
Anesthesia: General

## 2017-12-17 MED ORDER — FENTANYL CITRATE (PF) 100 MCG/2ML IJ SOLN: 25.0000 ug | INTRAMUSCULAR | Status: DC | PRN

## 2017-12-17 MED ORDER — PROPOFOL 500 MG/50ML IV EMUL
INTRAVENOUS | Status: DC | PRN
  Administered 2017-12-17: 50 ug/kg/min via INTRAVENOUS

## 2017-12-17 MED ORDER — PROPOFOL 10 MG/ML IV BOLUS
INTRAVENOUS | Status: DC | PRN
  Administered 2017-12-17 (×3): 10 mg via INTRAVENOUS
  Administered 2017-12-17: 20 mg via INTRAVENOUS

## 2017-12-17 MED ORDER — ONDANSETRON HCL 4 MG/2ML IJ SOLN: 4.0000 mg | Freq: Once | INTRAMUSCULAR | Status: DC | PRN

## 2017-12-17 MED ORDER — SUCCINYLCHOLINE CHLORIDE 20 MG/ML IJ SOLN
INTRAMUSCULAR | Status: AC
  Filled 2017-12-17: qty 1

## 2017-12-17 MED ORDER — TRIAMCINOLONE ACETONIDE 40 MG/ML IJ SUSP
40.0000 mg | Freq: Once | INTRAMUSCULAR | Status: AC
  Administered 2017-12-17: 40 mg
  Filled 2017-12-17: qty 1

## 2017-12-17 MED ORDER — SODIUM CHLORIDE 0.9 % IV SOLN
INTRAVENOUS | Status: DC
  Administered 2017-12-17: 1000 mL via INTRAVENOUS

## 2017-12-17 MED ORDER — LIDOCAINE HCL (PF) 2 % IJ SOLN
INTRAMUSCULAR | Status: AC
  Filled 2017-12-17: qty 10

## 2017-12-17 MED ORDER — LIDOCAINE HCL (PF) 2 % IJ SOLN
INTRAMUSCULAR | Status: DC | PRN
  Administered 2017-12-17: 50 mg via INTRADERMAL

## 2017-12-17 NOTE — Anesthesia Postprocedure Evaluation (Signed)
Anesthesia Post Note  Patient: [name]  Procedure(s) Performed: ESOPHAGOGASTRODUODENOSCOPY (EGD) WITH PROPOFOL (N/A )  Patient location during evaluation: Endoscopy Anesthesia Type: General Level of consciousness: awake and alert Pain management: pain level controlled Vital Signs Assessment: post-procedure vital signs reviewed and stable Respiratory status: spontaneous breathing, nonlabored ventilation and respiratory function stable Cardiovascular status: blood pressure returned to baseline and stable Postop Assessment: no apparent nausea or vomiting Anesthetic complications: no     Last Vitals:  Vitals:   12/17/17 1120 12/17/17 1130  BP: (!) 131/106 (!) 161/76  Pulse: (!) 54 (!) 58  Resp: 19 (!) 22  Temp:    SpO2: 98% 92%    Last Pain:  Vitals:   12/17/17 1050  TempSrc: Tympanic  PainSc:                  Alphonsus Sias

## 2017-12-17 NOTE — H&P (Signed)
[name], MD [address], [place], [place], [place], [address] [address], [place], [place], [place], [address] Phone: [phone]  Fax: [phone]  Primary Care Physician:  Tonia Ghent, MD   Pre-Procedure History & Physical: HPI:  [name] is a 83 y.o. female is here for an EGD.   Past Medical History:  Diagnosis Date  . Arthritis   . Asthma   . COPD (chronic obstructive pulmonary disease) ([place])   . Dysphonia   . Dyspnea   . Esophageal cancer ([place])   . GERD (gastroesophageal reflux disease)   . Heart murmur   . History of kidney stones   . Hypertension   . Hypokalemia 10/07/2017  . Melanoma ([place])   . OAB (overactive bladder)   . OSA on CPAP   . 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 Personal history of chemotherapy    esophageal cancer  . Personal history of radiation therapy    esophageal cancer  . Pupil asymmetry    R pupil defect  . Skin cancer   . Sleep apnea   . Spinal cord abscess     Past Surgical History:  Procedure Laterality Date  . ABDOMINAL HYSTERECTOMY    . ANTERIOR CERVICAL DECOMP/DISCECTOMY FUSION N/A 07/16/2013   Procedure: ANTERIOR CERVICAL DECOMPRESSION/DISCECTOMY FUSION mutlipleLEVELS C4-7;  Surgeon: Erline Levine, MD;  Location: [place] NEURO ORS;  Service: Neurosurgery;  Laterality: N/A;  . APPENDECTOMY    . carpel tunn Bilateral   . CATARACT EXTRACTION    . CATARACT EXTRACTION, BILATERAL    . CHOLECYSTECTOMY    . dental implant    . ESOPHAGOGASTRODUODENOSCOPY Left 03/21/2017   Procedure: ESOPHAGOGASTRODUODENOSCOPY (EGD);  Surgeon: Virgel Manifold, MD;  Location[facility];  Service: Endoscopy;  Laterality: Left;  . ESOPHAGOGASTRODUODENOSCOPY (EGD) WITH PROPOFOL N/A 08/20/2017   Procedure: ESOPHAGOGASTRODUODENOSCOPY (EGD) WITH PROPOFOL;  Surgeon: Virgel Manifold, MD;  Location: [facility] ENDOSCOPY;  Service: Endoscopy;  Laterality: N/A;  . ESOPHAGOGASTRODUODENOSCOPY (EGD) WITH PROPOFOL N/A  09/19/2017   Procedure: ESOPHAGOGASTRODUODENOSCOPY (EGD) WITH PROPOFOL;  Surgeon: Virgel Manifold, MD;  Location: [facility] ENDOSCOPY;  Service: Endoscopy;  Laterality: N/A;  . ESOPHAGOGASTRODUODENOSCOPY (EGD) WITH PROPOFOL N/A 10/03/2017   Procedure: ESOPHAGOGASTRODUODENOSCOPY (EGD) WITH PROPOFOL;  Surgeon: Virgel Manifold, MD;  Location: [facility] ENDOSCOPY;  Service: Endoscopy;  Laterality: N/A;  . ESOPHAGOGASTRODUODENOSCOPY (EGD) WITH PROPOFOL N/A 10/22/2017   Procedure: ESOPHAGOGASTRODUODENOSCOPY (EGD) WITH PROPOFOL;  Surgeon: Virgel Manifold, MD;  Location: [facility] ENDOSCOPY;  Service: Endoscopy;  Laterality: N/A;  . ESOPHAGOGASTRODUODENOSCOPY (EGD) WITH PROPOFOL N/A 11/18/2017   Procedure: ESOPHAGOGASTRODUODENOSCOPY (EGD) WITH PROPOFOL;  Surgeon: Virgel Manifold, MD;  Location: [facility] ENDOSCOPY;  Service: Endoscopy;  Laterality: N/A;  . EYE SURGERY    . GASTRIC BYPASS    . HIATAL HERNIA REPAIR    . JOINT REPLACEMENT    . MELANOMA EXCISION    . PORTA CATH INSERTION N/A 04/07/2017   Procedure: PORTA CATH INSERTION;  Surgeon: Algernon Huxley, MD;  Location: [place] CV LAB;  Service: Cardiovascular;  Laterality: N/A;  . POSTERIOR CERVICAL FUSION/FORAMINOTOMY N/A 07/28/2013   Procedure: Cervical four-seven  posterior cervical fusion;  Surgeon: Erline Levine, MD;  Location: [facility] NEURO ORS;  Service: Neurosurgery;  Laterality: N/A;  . TONSILLECTOMY    . TOTAL KNEE ARTHROPLASTY      Prior to Admission medications   Medication Sig Start Date End Date Taking? Authorizing Provider  acetaminophen (TYLENOL) 325 MG tablet Take 650 mg by mouth  every 6 (six) hours as needed for moderate pain or headache.    Yes [provider]  acyclovir (ZOVIRAX) 400 MG tablet Take 1 tablet (400 mg total) by mouth 2 (two) times daily. 12/15/17  Yes Jacquelin Hawking, NP  ALBUTEROL SULFATE HFA IN Inhale into the lungs.  07/07/15  Yes [provider]  bisacodyl (DULCOLAX) 10 MG suppository Place  rectally.   Yes [provider]  calcium carbonate (TUMS EX) 750 MG chewable tablet Chew by mouth.   Yes [provider]  CARAFATE 1 GM/10ML suspension TAKE 10MLS BY MOUTH FOUR TIMES DAILY 10/28/17  Yes Virgel Manifold, MD  Cholecalciferol (VITAMIN D) 2000 UNITS CAPS Take 2,000 Units by mouth daily.   Yes [provider]  cyanocobalamin 500 MCG tablet Take 500 mcg every other day by mouth.   Yes [provider]  Diclofenac Sodium 1 % CREA Apply to left arm as needed for pain three times daily 04/23/17  Yes Earlie Server, MD  fluticasone [facility]) 50 MCG/ACT nasal spray Place 1-2 sprays into both nostrils daily. 06/13/17  Yes Tonia Ghent, MD  furosemide (LASIX) 20 MG tablet Take 1 tablet (20 mg total) by mouth daily. 08/14/17  Yes Minna Merritts, MD  hydrocortisone cream 1 % Apply 1 application topically daily as needed for itching.    Yes [provider]  lidocaine-prilocaine (EMLA) cream Apply to affected area once 04/10/17  Yes Earlie Server, MD  loratadine (CLARITIN) 10 MG tablet Take 10 mg by mouth daily.   Yes [provider]  LYRICA 150 MG capsule TAKE 1 CAPSULE BY MOUTH TWO TIMES DAILY 01/06/17  Yes Tonia Ghent, MD  metoprolol succinate (TOPROL-XL) 50 MG 24 hr tablet TAKE 1 TABLET BY MOUTH DAILY 10/28/17  Yes Tonia Ghent, MD  ondansetron (ZOFRAN) 8 MG tablet Take 1 tablet (8 mg total) by mouth 2 (two) times daily as needed for refractory nausea / vomiting. Start on day 3 after chemotherapy. 06/30/17  Yes Earlie Server, MD  oxybutynin (DITROPAN) 5 MG tablet TAKE 1 TABLET BY MOUTH EVERY MORNING AND1/2 TABLET AT BEDTIME 10/08/17  Yes Tonia Ghent, MD  pantoprazole (PROTONIX) 40 MG tablet Take 40 mg by mouth daily. 10/28/17  Yes [provider]  Pediatric Multiple Vitamins (FLINTSTONES MULTIVITAMIN PO) Take by mouth.   Yes [provider]  Polyethyl Glycol-Propyl Glycol (SYSTANE OP) Apply 1 drop to eye daily as needed (dry  eyes).   Yes [provider]  potassium chloride (KLOR-CON) 20 MEQ packet Take 20 mEq by mouth as needed (Daily as needed with fluid pill). 08/14/17  Yes Gollan, Kathlene November, MD  prochlorperazine (COMPAZINE) 10 MG tablet Take 1 tablet (10 mg total) by mouth every 6 (six) hours as needed (Nausea or vomiting). 06/30/17  Yes Earlie Server, MD  pyridOXINE (VITAMIN B-6) 100 MG tablet Take 1 tablet (100 mg total) by mouth daily. 04/28/17  Yes Earlie Server, MD  senna-docusate (SENOKOT-S) 8.6-50 MG tablet Take 1 tablet at bedtime as needed by mouth for mild constipation. 03/22/17  Yes Gouru, Illene Silver, MD  traMADol (ULTRAM) 50 MG tablet Take 1 tablet (50 mg total) by mouth every 6 (six) hours as needed. 11/28/17  Yes Earlie Server, MD  traZODone (DESYREL) 50 MG tablet TAKE 1/2 TABLET BY MOUTH AT BEDTIME IF NEEDED FOR SLEEP 04/17/17  Yes Tonia Ghent, MD  pantoprazole (PROTONIX) 40 MG tablet Take 1 tablet (40 mg total) by mouth 2 (two) times daily before  a meal. 09/09/17 12/15/17  Earlie Server, MD  ranitidine (ZANTAC) 150 MG/10ML syrup Take 10 mLs (150 mg total) by mouth at bedtime. 11/12/17 12/15/17  Virgel Manifold, MD    Allergies as of 11/21/2017 - Review Complete 11/18/2017  Allergen Reaction Noted  . Cefuroxime Nausea Only 07/11/2015  . Codeine Nausea Only 01/22/2011  . Doxycycline Other (See Comments) 12/03/2013  . Gabapentin Other (See Comments) 12/19/2016  . Iohexol Itching 07/15/2013  . Other Other (See Comments) 12/03/2013  . Oxycodone Other (See Comments) 12/19/2016  . Quinolones Swelling 12/03/2013  . Synvisc [hylan g-f 20] Swelling 01/22/2011    Family History  Problem Relation Age of Onset  . Breast cancer Mother 73  . Arthritis-Osteo Mother   . Breast cancer Sister 58  . Bladder Cancer Sister   . Bladder Cancer Father   . Tongue cancer Father   . Congenital heart disease Father   . Prostate cancer Brother   . Uterine cancer Maternal Aunt   . Lung cancer Paternal Uncle   . Leukemia Maternal  Grandmother   . Liver cancer Paternal Grandmother   . Colon cancer Neg Hx     Social History   Socioeconomic History  . Marital status: Widowed    Spouse name: Not on file  . Number of children: 2  . Years of education: Not on file  . Highest education level: Master's degree (e.g., MA, MS, MEng, MEd, MSW, MBA)  Occupational History  . Not on file  Social Needs  . Financial resource strain: Not hard at all  . Food insecurity:    Worry: Never true    Inability: Never true  . Transportation needs:    Medical: No    Non-medical: No  Tobacco Use  . Smoking status: Former Smoker    Packs/day: 1.00    Years: 20.00    Pack years: 20.00    Types: Cigarettes    Last attempt to quit: 1977    Years since quitting: 42.6  . Smokeless tobacco: Never Used  Substance and Sexual Activity  . Alcohol use: No  . Drug use: No  . Sexual activity: Never  Lifestyle  . Physical activity:    Days per week: 2 days    Minutes per session: 30 min  . Stress: Not at all  Relationships  . Social connections:    Talks on phone: More than three times a week    Gets together: More than three times a week    Attends religious service: More than 4 times per year    Active member of club or organization: Yes    Attends meetings of clubs or organizations: More than 4 times per year    Relationship status: Widowed  . Intimate partner violence:    Fear of current or ex partner: No    Emotionally abused: No    Physically abused: No    Forced sexual activity: No  Other Topics Concern  . Not on file  Social History Narrative   Marital Status- Widowed    Lives by herself    Employement- Retired Pharmacist, hospital   Exercise hx- Does PT     Review of Systems: See HPI, otherwise negative ROS  Physical Exam: BP 114/84   Pulse (!) 56   Temp (!) 96.4 F (35.8 C) (Tympanic)   Resp 20   Ht 5' 2.5" (1.588 m)   Wt 198 lb (89.8 kg)   SpO2 98%   BMI 35.64 kg/m  General:  Alert,  pleasant and cooperative in  NAD Head:  Normocephalic and atraumatic. Neck:  Supple; no masses or thyromegaly. Lungs:  Clear throughout to auscultation, normal respiratory effort.    Heart:  +S1, +S2, Regular rate and rhythm, No edema. Abdomen:  Soft, nontender and nondistended. Normal bowel sounds, without guarding, and without rebound.   Neurologic:  Alert and  oriented x4;  grossly normal neurologically.  Impression/Plan: [name] is here for an EGD for radiation esophagitis stricture and dysphagia.  Risks, benefits, limitations, and alternatives regarding the procedure have been reviewed with the patient.  Questions have been answered.  All parties agreeable.   Virgel Manifold, MD  12/17/2017, 10:14 AM

## 2017-12-17 NOTE — Anesthesia Preprocedure Evaluation (Signed)
Anesthesia Evaluation  Patient identified by MRN, date of birth, ID band Patient awake    Reviewed: Allergy & Precautions, H&P , NPO status , reviewed documented beta blocker date and time   Airway Mallampati: III  TM Distance: >3 FB Neck ROM: limited    Dental  (+) Caps, Missing   Pulmonary shortness of breath, asthma , sleep apnea , COPD, former smoker,    Pulmonary exam normal        Cardiovascular hypertension, + CAD and + Peripheral Vascular Disease  + Valvular Problems/Murmurs  + Systolic murmurs 67/8938 ECHO Study Conclusions  - Left ventricle: The cavity size was normal. Systolic function was   normal. The estimated ejection fraction was in the range of 55%   to 65%. - Aortic valve: There was mild to moderate stenosis. There was   moderate regurgitation. Valve area (VTI): 2.28 cm^2. Valve area   (Vmax): 2.01 cm^2. Valve area (Vmean): 2.27 cm^2. - Mitral valve: There was mild regurgitation. Valve area by   pressure half-time: 2.34 cm^2. Valve area by continuity equation   (using LVOT flow): 2.77 cm^2.   Neuro/Psych  Neuromuscular disease    GI/Hepatic PUD, GERD  ,  Endo/Other  Morbid obesity  Renal/GU      Musculoskeletal  (+) Arthritis ,   Abdominal   Peds  Hematology  (+) anemia ,   Anesthesia Other Findings Past Medical History: No date: Arthritis No date: Asthma No date: COPD (chronic obstructive pulmonary disease) ([facility]) No date: Dysphonia No date: Dyspnea No date: Esophageal cancer ([facility]) No date: GERD (gastroesophageal reflux disease) No date: Heart murmur No date: History of kidney stones No date: Hypertension 10/07/2017: Hypokalemia No date: Melanoma ([place]) No date: OAB (overactive bladder) No date: OSA on CPAP No date: Paresthesia     Comment:  from neck down after spinal abscess No date: Personal history of chemotherapy     Comment:  esophageal cancer No date: Personal history of  radiation therapy     Comment:  esophageal cancer No date: Pupil asymmetry     Comment:  R pupil defect No date: Skin cancer No date: Sleep apnea 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  Past Surgical History: No date: ABDOMINAL HYSTERECTOMY 07/16/2013: ANTERIOR CERVICAL DECOMP/DISCECTOMY FUSION; N/A     Comment:  Procedure: ANTERIOR CERVICAL DECOMPRESSION/DISCECTOMY               FUSION mutlipleLEVELS C4-7;  Surgeon: Erline Levine, MD;                Location: [facility] NEURO ORS;  Service: Neurosurgery;                Laterality: N/A; No date: APPENDECTOMY No date: carpel tunn; Bilateral No date: CATARACT EXTRACTION No date: CATARACT EXTRACTION, BILATERAL No date: CHOLECYSTECTOMY No date: dental implant 03/21/2017: ESOPHAGOGASTRODUODENOSCOPY; Left     Comment:  Procedure: ESOPHAGOGASTRODUODENOSCOPY (EGD);  Surgeon:               Virgel Manifold, MD;  Location: [facility] ENDOSCOPY;                Service: Endoscopy;  Laterality: Left; 08/20/2017: ESOPHAGOGASTRODUODENOSCOPY (EGD) WITH PROPOFOL; N/A     Comment:  Procedure: ESOPHAGOGASTRODUODENOSCOPY (EGD) WITH               PROPOFOL;  Surgeon: Virgel Manifold, MD;  Location:               [facility] ENDOSCOPY;  Service: Endoscopy;  Laterality: N/A; 09/19/2017:  ESOPHAGOGASTRODUODENOSCOPY (EGD) WITH PROPOFOL; N/A     Comment:  Procedure: ESOPHAGOGASTRODUODENOSCOPY (EGD) WITH               PROPOFOL;  Surgeon: Virgel Manifold, MD;  Location:               [facility] ENDOSCOPY;  Service: Endoscopy;  Laterality: N/A; 10/03/2017: ESOPHAGOGASTRODUODENOSCOPY (EGD) WITH PROPOFOL; N/A     Comment:  Procedure: ESOPHAGOGASTRODUODENOSCOPY (EGD) WITH               PROPOFOL;  Surgeon: Virgel Manifold, MD;  Location:               [facility] ENDOSCOPY;  Service: Endoscopy;  Laterality: N/A; 10/22/2017: ESOPHAGOGASTRODUODENOSCOPY (EGD) WITH PROPOFOL; N/A     Comment:  Procedure: ESOPHAGOGASTRODUODENOSCOPY (EGD) WITH               PROPOFOL;  Surgeon: Virgel Manifold,  MD;  Location:               [facility] ENDOSCOPY;  Service: Endoscopy;  Laterality: N/A; 11/18/2017: ESOPHAGOGASTRODUODENOSCOPY (EGD) WITH PROPOFOL; N/A     Comment:  Procedure: ESOPHAGOGASTRODUODENOSCOPY (EGD) WITH               PROPOFOL;  Surgeon: Virgel Manifold, MD;  Location:               [facility] ENDOSCOPY;  Service: Endoscopy;  Laterality: N/A; No date: EYE SURGERY No date: GASTRIC BYPASS No date: HIATAL HERNIA REPAIR No date: JOINT REPLACEMENT No date: MELANOMA EXCISION 04/07/2017: PORTA CATH INSERTION; N/A     Comment:  Procedure: PORTA CATH INSERTION;  Surgeon: Algernon Huxley,              MD;  Location: [place] CV LAB;  Service:               Cardiovascular;  Laterality: N/A; 07/28/2013: POSTERIOR CERVICAL FUSION/FORAMINOTOMY; N/A     Comment:  Procedure: Cervical four-seven  posterior cervical               fusion;  Surgeon: Erline Levine, MD;  Location: [place] NEURO               ORS;  Service: Neurosurgery;  Laterality: N/A; No date: TONSILLECTOMY No date: TOTAL KNEE ARTHROPLASTY  BMI    Body Mass Index:  35.64 kg/m      Reproductive/Obstetrics                             Anesthesia Physical Anesthesia Plan  ASA: IV  Anesthesia Plan: General   Post-op Pain Management:    Induction: Intravenous  PONV Risk Score and Plan: 3 and Treatment may vary due to age or medical condition and TIVA  Airway Management Planned: Nasal Cannula and Natural Airway  Additional Equipment:   Intra-op Plan:   Post-operative Plan:   Informed Consent: I have reviewed the patients History and Physical, chart, labs and discussed the procedure including the risks, benefits and alternatives for the proposed anesthesia with the patient or authorized representative who has indicated his/her understanding and acceptance.   Dental Advisory Given  Plan Discussed with: CRNA  Anesthesia Plan Comments:         Anesthesia Quick Evaluation

## 2017-12-17 NOTE — Transfer of Care (Signed)
Immediate Anesthesia Transfer of Care Note  Patient: [name]  Procedure(s) Performed: ESOPHAGOGASTRODUODENOSCOPY (EGD) WITH PROPOFOL (N/A )  Patient Location: PACU  Anesthesia Type:General  Level of Consciousness: sedated  Airway & Oxygen Therapy: Patient Spontanous Breathing and Patient connected to nasal cannula oxygen  Post-op Assessment: Report given to RN and Post -op Vital signs reviewed and stable  Post vital signs: Reviewed and stable  Last Vitals:  Vitals Value Taken Time  BP 146/75 12/17/2017 10:56 AM  Temp 36.1 C 12/17/2017 10:56 AM  Pulse 60 12/17/2017 10:56 AM  Resp 11 12/17/2017 10:56 AM  SpO2 99 % 12/17/2017 10:56 AM  Vitals shown include unvalidated device data.  Last Pain:  Vitals:   12/17/17 0941  TempSrc: Tympanic  PainSc: 0-No pain         Complications: No apparent anesthesia complications

## 2017-12-17 NOTE — Op Note (Signed)
[facility] Gastroenterology Patient Name: [name] Procedure Date: 12/17/2017 10:27 AM MRN: [id number] Account #: [id number] Date of Birth: 12-18-1934 Admit Type: Outpatient Age: 83 Room: [facility] ENDO ROOM 2 Gender: Female Note Status: Finalized Procedure:            Upper GI endoscopy Indications:          Dysphagia, Follow-up of esophageal stricture, For                        therapy of esophageal stricture Providers:            Javyon Fontan B. Bonna Gains MD, MD Referring MD:         Elveria Rising. Damita Dunnings, MD (Referring MD) Medicines:            Monitored Anesthesia Care Complications:        No immediate complications. Procedure:            Pre-Anesthesia Assessment:                       - The risks and benefits of the procedure and the                        sedation options and risks were discussed with the                        patient. All questions were answered and informed                        consent was obtained.                       - Patient identification and proposed procedure were                        verified prior to the procedure.                       - ASA Grade Assessment: III - A patient with severe                        systemic disease.                       After obtaining informed consent, the endoscope was                        passed under direct vision. Throughout the procedure,                        the patient's blood pressure, pulse, and oxygen                        saturations were monitored continuously. The Endoscope                        was introduced through the mouth, and advanced to the                        jejunum. The upper GI endoscopy was accomplished with  ease. The patient tolerated the procedure well. Findings:      One moderate stenosis was found in the distal esophagus. This stenosis       measured 1 cm (in length). The stenosis was traversed. A TTS dilator was       passed through  the scope. Dilation with a 12-13.5-15 mm balloon dilator       was performed to 13.5 mm which lead to good heme effect and no further       dilation was necessary. This is the first procedure since the diagnosis       of her stricture where the scope was able to be advanced past the       stricture without dilating first. The stricture was more open than her       previous procedures. Area was successfully injected with 5 mL of       triamcinolone (40 mg/mL) for in 4 quadrants with 1.25 cc in each       quadrant.      The entire examined stomach was normal.      The anastomosis was normal. Previously seen anastomotic stricture was       not present.      The examined jejunum was normal. Impression:           - Esophageal stenosis. Dilated. Injected.                       - Normal stomach.                       - Normal anastomosis.                       - Normal examined jejunum.                       - No specimens collected. Recommendation:       - Discharge patient to home.                       - Follow an antireflux regimen.                       - Continue present medications.                       - Repeat upper endoscopy in 3 weeks to assess disease                        activity.                       - Soft diet.                       - The findings and recommendations were discussed with                        the patient.                       - The findings and recommendations were discussed with                        the patient's family. Procedure Code(s):    --- Professional ---  [phone], Esophagogastroduodenoscopy, flexible, transoral;                        with transendoscopic balloon dilation of esophagus                        (less than 30 mm diameter) Diagnosis Code(s):    --- Professional ---                       K22.2, Esophageal obstruction                       R13.10, Dysphagia, unspecified CPT copyright 2017 American Medical  Association. All rights reserved. The codes documented in this report are preliminary and upon coder review may  be revised to meet current compliance requirements.  [name], MD Margretta Sidle B. Bonna Gains MD, MD 12/17/2017 11:05:40 AM This report has been signed electronically. Number of Addenda: 0 Note Initiated On: 12/17/2017 10:27 AM      [facility]

## 2017-12-17 NOTE — Anesthesia Post-op Follow-up Note (Signed)
Anesthesia QCDR form completed.        

## 2017-12-22 ENCOUNTER — Inpatient Hospital Stay: Payer: Medicare Other

## 2017-12-22 ENCOUNTER — Encounter: Payer: Self-pay | Admitting: Oncology

## 2017-12-22 ENCOUNTER — Inpatient Hospital Stay (HOSPITAL_BASED_OUTPATIENT_CLINIC_OR_DEPARTMENT_OTHER): Payer: Medicare Other | Admitting: Oncology

## 2017-12-22 ENCOUNTER — Other Ambulatory Visit: Payer: Self-pay

## 2017-12-22 VITALS — BP 122/80 | HR 69 | Temp 97.8°F | Resp 18 | Wt 206.6 lb

## 2017-12-22 DIAGNOSIS — E876 Hypokalemia: Secondary | ICD-10-CM

## 2017-12-22 DIAGNOSIS — K222 Esophageal obstruction: Secondary | ICD-10-CM

## 2017-12-22 DIAGNOSIS — Z803 Family history of malignant neoplasm of breast: Secondary | ICD-10-CM

## 2017-12-22 DIAGNOSIS — K1230 Oral mucositis (ulcerative), unspecified: Secondary | ICD-10-CM | POA: Diagnosis not present

## 2017-12-22 DIAGNOSIS — C155 Malignant neoplasm of lower third of esophagus: Secondary | ICD-10-CM

## 2017-12-22 DIAGNOSIS — Z923 Personal history of irradiation: Secondary | ICD-10-CM

## 2017-12-22 DIAGNOSIS — K121 Other forms of stomatitis: Secondary | ICD-10-CM

## 2017-12-22 DIAGNOSIS — Z87891 Personal history of nicotine dependence: Secondary | ICD-10-CM

## 2017-12-22 DIAGNOSIS — Z5111 Encounter for antineoplastic chemotherapy: Secondary | ICD-10-CM

## 2017-12-22 DIAGNOSIS — Z862 Personal history of diseases of the blood and blood-forming organs and certain disorders involving the immune mechanism: Secondary | ICD-10-CM

## 2017-12-22 DIAGNOSIS — G629 Polyneuropathy, unspecified: Secondary | ICD-10-CM

## 2017-12-22 DIAGNOSIS — L271 Localized skin eruption due to drugs and medicaments taken internally: Secondary | ICD-10-CM

## 2017-12-22 LAB — CBC WITH DIFFERENTIAL/PLATELET
BASOS ABS: 0 10*3/uL (ref 0–0.1)
Basophils Relative: 0 %
EOS PCT: 3 %
Eosinophils Absolute: 0.2 10*3/uL (ref 0–0.7)
HEMATOCRIT: 38 % (ref 35.0–47.0)
Hemoglobin: 12.9 g/dL (ref 12.0–16.0)
LYMPHS ABS: 0.5 10*3/uL — AB (ref 1.0–3.6)
LYMPHS PCT: 9 %
MCH: 34 pg (ref 26.0–34.0)
MCHC: 33.9 g/dL (ref 32.0–36.0)
MCV: 100.5 fL — AB (ref 80.0–100.0)
MONO ABS: 0.4 10*3/uL (ref 0.2–0.9)
MONOS PCT: 7 %
NEUTROS ABS: 4.1 10*3/uL (ref 1.4–6.5)
Neutrophils Relative %: 81 %
Platelets: 191 10*3/uL (ref 150–440)
RBC: 3.78 MIL/uL — ABNORMAL LOW (ref 3.80–5.20)
RDW: 15.6 % — AB (ref 11.5–14.5)
WBC: 5.2 10*3/uL (ref 3.6–11.0)

## 2017-12-22 LAB — COMPREHENSIVE METABOLIC PANEL
ALBUMIN: 3.7 g/dL (ref 3.5–5.0)
ALT: 14 U/L (ref 0–44)
ANION GAP: 12 (ref 5–15)
AST: 32 U/L (ref 15–41)
Alkaline Phosphatase: 83 U/L (ref 38–126)
BUN: 15 mg/dL (ref 8–23)
CHLORIDE: 107 mmol/L (ref 98–111)
CO2: 25 mmol/L (ref 22–32)
Calcium: 8.9 mg/dL (ref 8.9–10.3)
Creatinine, Ser: 0.81 mg/dL (ref 0.44–1.00)
GFR calc Af Amer: >60 mL/min (ref >60)
GFR calc non Af Amer: >60 mL/min (ref >60)
GLUCOSE: 121 mg/dL — AB (ref 70–99)
POTASSIUM: 3.3 mmol/L — AB (ref 3.5–5.1)
SODIUM: 144 mmol/L (ref 135–145)
TOTAL PROTEIN: 6.8 g/dL (ref 6.5–8.1)
Total Bilirubin: 0.7 mg/dL (ref 0.3–1.2)

## 2017-12-22 MED ORDER — HEPARIN SOD (PORK) LOCK FLUSH 100 UNIT/ML IV SOLN: 500.0000 [IU] | Freq: Once | INTRAVENOUS | Status: DC

## 2017-12-22 MED ORDER — FLUOROURACIL CHEMO INJECTION 5 GM/100ML
2400.0000 mg/m2 | INTRAVENOUS | Status: DC
  Administered 2017-12-22: 5200 mg via INTRAVENOUS
  Filled 2017-12-22: qty 100

## 2017-12-22 MED ORDER — FLUOROURACIL CHEMO INJECTION 2.5 GM/50ML
400.0000 mg/m2 | Freq: Once | INTRAVENOUS | Status: AC
  Administered 2017-12-22: 850 mg via INTRAVENOUS
  Filled 2017-12-22: qty 17

## 2017-12-22 MED ORDER — LEUCOVORIN CALCIUM INJECTION 350 MG: 400.0000 mg/m2 | Freq: Once | INTRAVENOUS | Status: DC

## 2017-12-22 MED ORDER — LEUCOVORIN CALCIUM INJECTION 100 MG
20.0000 mg/m2 | Freq: Once | INTRAMUSCULAR | Status: AC
  Administered 2017-12-22: 44 mg via INTRAVENOUS
  Filled 2017-12-22: qty 2.2

## 2017-12-22 MED ORDER — DEXTROSE 5 % IV SOLN
Freq: Once | INTRAVENOUS | Status: AC
  Administered 2017-12-22: 12:00:00 via INTRAVENOUS
  Filled 2017-12-22: qty 1000

## 2017-12-22 MED ORDER — SODIUM CHLORIDE 0.9% FLUSH
10.0000 mL | Freq: Once | INTRAVENOUS | Status: AC
  Administered 2017-12-22: 10 mL via INTRAVENOUS
  Filled 2017-12-22: qty 10

## 2017-12-22 NOTE — Progress Notes (Signed)
Patient here for follow up. C/o sore mouth.

## 2017-12-22 NOTE — Progress Notes (Signed)
Hematology/Oncology Follow Up Note [facility]  Telephone:([phone] [phone]  Patient Care Team: Tonia Ghent, MD as PCP - General (Family Medicine)   Name of the patient: [name]  [id number]  Sep 16, 1934   REASON FOR VISIT Follow up of chemotherapy management of esophageal adenocarcinoma  HISTORY OF PRESENT ILLNESS Patient is a 83 year old female who has metastatic esophageal cancer with liver involvement.  Weight loss 20 pounds for the past year prior to diagnosis.  # EGD during admission showed partially obstructive esophageal mass at the distal part of esophagus. Biopsy was taken. Pathology showed esophageal adenocarcinoma. #US biopsy of liver lesion to proof distant metastasis.  Report family history of breast cancer, but she has been having routine mammograms.  # Lives alone. She's a former smoker.  # Molecular Testing:  #[employer] Cdx: No reportable alternations with companion diagnostic (CDx) claims.  MS stable Tumor mutation burden: Cannot be determined, CCND3 amplification: Equivocal.  Amended reports on 06/04/2017, Tumor mutational burden changed from can not be determined to "TMB -low" on the re-analyzed samples.  # HER 2 negative.   Current Treatment S/p 6 cycles of FOLFOX, PET scan showed excellent partial response from both chemotherapy and radiation, result was discussed with patient.  currently on 5-FU maintenance.  S/p palliative radiation in January 2019 # Developed esophageal stricture secondary to radiation in April and got dilation via endoscopy. EGD shoed Benign-appearing esophageal stenosis. This is a result of radiation therapy.- LA Grade C radiation esophagitis. - The previously noted esophageal mass in Nov 2018 was much decreased indicating good response to radiation/chemotherapy. - Normal stomach.- Normal examined jejunum.- No specimens collected.    INTERVAL HISTORY  Patient presents for assessment  prior to maintenance 5-FU treatment.  #Chemotherapy, tolerates well.  Denies nausea vomiting.  Formed bowel movement every day.  No diarrhea. #Mouth sore, she was seen by nurse practitioner Anderson Malta during interval.  Improving with acid closure 400 mg twice daily and biotene mouthwash.  She still has 1 oral lesion on the left side of her mouth.  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  She has had multiple EGD for dilation and recently just had one.  Dysphagia is better after the EGD.  She has mild dysphagia  #Chronic neuropathy, pre-existing.  Stable.  Pre-existing prior to the exposure to oxaliplatin.   #Dry skin on her hands.  Getting better. #Fatigue, stable   Review of systems Review of Systems  Constitutional: Positive for malaise/fatigue. Negative for chills, diaphoresis, fever and weight loss.  HENT: Negative for ear discharge, ear pain, hearing loss, nosebleeds and sore throat.        Mouth sore getting better, less painful.  Eyes: Negative for double vision, photophobia, pain, discharge and redness.  Respiratory: Negative for cough, hemoptysis, shortness of breath and wheezing.   Cardiovascular: Negative for chest pain, palpitations, orthopnea, claudication and leg swelling.  Gastrointestinal: Negative for abdominal pain, blood in stool, constipation, diarrhea, heartburn, melena, nausea and vomiting.       Dysphagia  Genitourinary: Negative for dysuria, flank pain, frequency, hematuria and urgency.  Musculoskeletal: Negative for back pain, joint pain, myalgias and neck pain.  Skin: Negative for itching and rash.       Dry skin on her hands.  Neurological: Positive for tingling and sensory change. Negative for dizziness, tremors, focal weakness, weakness and headaches.  Endo/Heme/Allergies: Negative for environmental allergies. Does not bruise/bleed easily.  Psychiatric/Behavioral: Negative for depression, hallucinations, substance abuse and suicidal  ideas. The patient is not nervous/anxious.     Allergies  Allergen Reactions  . Cefuroxime Nausea Only  . Codeine Nausea Only  . Doxycycline Other (See Comments)    Lip swelling  . Gabapentin Other (See Comments)    Swelling.   . Iohexol Itching    07-15-13 pt developed itching on fingers after contrast given. Dr. Irish Elders looked at pt and said to put in system as allergy. BB  . Other Other (See Comments)    Contrast Dye- caused fever, chills, awful feeling Bandages - itching, rash  . Oxycodone Other (See Comments)    nausea  . Quinolones Swelling    Lip swelling  . Synvisc [Hylan G-F 20] Swelling     Past Medical History:  Diagnosis Date  . Arthritis   . Asthma   . COPD (chronic obstructive pulmonary disease) ([place])   . Dysphonia   . Dyspnea   . Esophageal cancer ([place])   . GERD (gastroesophageal reflux disease)   . Heart murmur   . History of kidney stones   . Hypertension   . Hypokalemia 10/07/2017  . Melanoma ([place])   . OAB (overactive bladder)   . OSA on CPAP   . 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 Personal history of chemotherapy    esophageal cancer  . Personal history of radiation therapy    esophageal cancer  . Pupil asymmetry    R pupil defect  . Skin cancer   . Sleep apnea   . Spinal cord abscess      Past Surgical History:  Procedure Laterality Date  . ABDOMINAL HYSTERECTOMY    . ANTERIOR CERVICAL DECOMP/DISCECTOMY FUSION N/A 07/16/2013   Procedure: ANTERIOR CERVICAL DECOMPRESSION/DISCECTOMY FUSION mutlipleLEVELS C4-7;  Surgeon: Erline Levine, MD;  Location: [facility]S;  Service: Neurosurgery;  Laterality: N/A;  . APPENDECTOMY    . carpel tunn Bilateral   . CATARACT EXTRACTION    . CATARACT EXTRACTION, BILATERAL    . CHOLECYSTECTOMY    . dental implant    . ESOPHAGOGASTRODUODENOSCOPY Left 03/21/2017   Procedure: ESOPHAGOGASTRODUODENOSCOPY (EGD);  Surgeon: Virgel Manifold, MD;  Location: [facility] ENDOSCOPY;  Service: Endoscopy;   Laterality: Left;  . ESOPHAGOGASTRODUODENOSCOPY (EGD) WITH PROPOFOL N/A 08/20/2017   Procedure: ESOPHAGOGASTRODUODENOSCOPY (EGD) WITH PROPOFOL;  Surgeon: Virgel Manifold, MD;  Location: [facility] ENDOSCOPY;  Service: Endoscopy;  Laterality: N/A;  . ESOPHAGOGASTRODUODENOSCOPY (EGD) WITH PROPOFOL N/A 09/19/2017   Procedure: ESOPHAGOGASTRODUODENOSCOPY (EGD) WITH PROPOFOL;  Surgeon: Virgel Manifold, MD;  Location: [facility] ENDOSCOPY;  Service: Endoscopy;  Laterality: N/A;  . ESOPHAGOGASTRODUODENOSCOPY (EGD) WITH PROPOFOL N/A 10/03/2017   Procedure: ESOPHAGOGASTRODUODENOSCOPY (EGD) WITH PROPOFOL;  Surgeon: Virgel Manifold, MD;  Location: [facility] ENDOSCOPY;  Service: Endoscopy;  Laterality: N/A;  . ESOPHAGOGASTRODUODENOSCOPY (EGD) WITH PROPOFOL N/A 10/22/2017   Procedure: ESOPHAGOGASTRODUODENOSCOPY (EGD) WITH PROPOFOL;  Surgeon: Virgel Manifold, MD;  Location: [facility] ENDOSCOPY;  Service: Endoscopy;  Laterality: N/A;  . ESOPHAGOGASTRODUODENOSCOPY (EGD) WITH PROPOFOL N/A 11/18/2017   Procedure: ESOPHAGOGASTRODUODENOSCOPY (EGD) WITH PROPOFOL;  Surgeon: Virgel Manifold, MD;  Location: [facility] ENDOSCOPY;  Service: Endoscopy;  Laterality: N/A;  . ESOPHAGOGASTRODUODENOSCOPY (EGD) WITH PROPOFOL N/A 12/17/2017   Procedure: ESOPHAGOGASTRODUODENOSCOPY (EGD) WITH PROPOFOL;  Surgeon: Virgel Manifold, MD;  Location: [facility] ENDOSCOPY;  Service: Endoscopy;  Laterality: N/A;  . EYE SURGERY    . GASTRIC BYPASS    . HIATAL HERNIA REPAIR    . JOINT REPLACEMENT    . MELANOMA EXCISION    . PORTA  CATH INSERTION N/A 04/07/2017   Procedure: PORTA CATH INSERTION;  Surgeon: Algernon Huxley, MD;  Location: [place] CV LAB;  Service: Cardiovascular;  Laterality: N/A;  . POSTERIOR CERVICAL FUSION/FORAMINOTOMY N/A 07/28/2013   Procedure: Cervical four-seven  posterior cervical fusion;  Surgeon: Erline Levine, MD;  Location: [place] NEURO ORS;  Service: Neurosurgery;  Laterality: N/A;  . TONSILLECTOMY    . TOTAL KNEE ARTHROPLASTY        Social History   Socioeconomic History  . Marital status: Widowed    Spouse name: Not on file  . Number of children: 2  . Years of education: Not on file  . Highest education level: Master's degree (e.g., MA, MS, MEng, MEd, MSW, MBA)  Occupational History  . Not on file  Social Needs  . Financial resource strain: Not hard at all  . Food insecurity:    Worry: Never true    Inability: Never true  . Transportation needs:    Medical: No    Non-medical: No  Tobacco Use  . Smoking status: Former Smoker    Packs/day: 1.00    Years: 20.00    Pack years: 20.00    Types: Cigarettes    Last attempt to quit: 1977    Years since quitting: 42.6  . Smokeless tobacco: Never Used  Substance and Sexual Activity  . Alcohol use: No  . Drug use: No  . Sexual activity: Never  Lifestyle  . Physical activity:    Days per week: 2 days    Minutes per session: 30 min  . Stress: Not at all  Relationships  . Social connections:    Talks on phone: More than three times a week    Gets together: More than three times a week    Attends religious service: More than 4 times per year    Active member of club or organization: Yes    Attends meetings of clubs or organizations: More than 4 times per year    Relationship status: Widowed  . Intimate partner violence:    Fear of current or ex partner: No    Emotionally abused: No    Physically abused: No    Forced sexual activity: No  Other Topics Concern  . Not on file  Social History Narrative   Marital Status- Widowed    Lives by herself    Employement- Retired Pharmacist, hospital   Exercise hx- Does PT     Family History  Problem Relation Age of Onset  . Breast cancer Mother 63  . Arthritis-Osteo Mother   . Breast cancer Sister 89  . Bladder Cancer Sister   . Bladder Cancer Father   . Tongue cancer Father   . Congenital heart disease Father   . Prostate cancer Brother   . Uterine cancer Maternal Aunt   . Lung cancer Paternal Uncle   .  Leukemia Maternal Grandmother   . Liver cancer Paternal Grandmother   . Colon cancer Neg Hx      Current Outpatient Medications:  .  acetaminophen (TYLENOL) 325 MG tablet, Take 650 mg by mouth every 6 (six) hours as needed for moderate pain or headache. , Disp: , Rfl:  .  acyclovir (ZOVIRAX) 400 MG tablet, Take 1 tablet (400 mg total) by mouth 2 (two) times daily., Disp: 30 tablet, Rfl: 0 .  ALBUTEROL SULFATE HFA IN, Inhale into the lungs. , Disp: , Rfl:  .  bisacodyl (DULCOLAX) 10 MG suppository, Place rectally., Disp: , Rfl:  .  calcium carbonate (TUMS EX) 750 MG chewable tablet, Chew by mouth., Disp: , Rfl:  .  CARAFATE 1 GM/10ML suspension, TAKE 10MLS BY MOUTH FOUR TIMES DAILY, Disp: 420 mL, Rfl: 1 .  Cholecalciferol (VITAMIN D) 2000 UNITS CAPS, Take 2,000 Units by mouth daily., Disp: , Rfl:  .  cyanocobalamin 500 MCG tablet, Take 500 mcg every other day by mouth., Disp: , Rfl:  .  fluticasone (FLONASE) 50 MCG/ACT nasal spray, Place 1-2 sprays into both nostrils daily., Disp: 16 g, Rfl: 5 .  furosemide (LASIX) 20 MG tablet, Take 1 tablet (20 mg total) by mouth daily., Disp: 90 tablet, Rfl: 3 .  hydrocortisone cream 1 %, Apply 1 application topically daily as needed for itching. , Disp: , Rfl:  .  lidocaine-prilocaine (EMLA) cream, Apply to affected area once, Disp: 30 g, Rfl: 3 .  loratadine (CLARITIN) 10 MG tablet, Take 10 mg by mouth daily., Disp: , Rfl:  .  LYRICA 150 MG capsule, TAKE 1 CAPSULE BY MOUTH TWO TIMES DAILY, Disp: 180 capsule, Rfl: 1 .  metoprolol succinate (TOPROL-XL) 50 MG 24 hr tablet, TAKE 1 TABLET BY MOUTH DAILY, Disp: 90 tablet, Rfl: 1 .  ondansetron (ZOFRAN) 8 MG tablet, Take 1 tablet (8 mg total) by mouth 2 (two) times daily as needed for refractory nausea / vomiting. Start on day 3 after chemotherapy., Disp: 30 tablet, Rfl: 1 .  oxybutynin (DITROPAN) 5 MG tablet, TAKE 1 TABLET BY MOUTH EVERY MORNING AND1/2 TABLET AT BEDTIME, Disp: 135 tablet, Rfl: 0 .  pantoprazole  (PROTONIX) 40 MG tablet, Take 40 mg by mouth daily., Disp: , Rfl:  .  Pediatric Multiple Vitamins (FLINTSTONES MULTIVITAMIN PO), Take by mouth., Disp: , Rfl:  .  Polyethyl Glycol-Propyl Glycol (SYSTANE OP), Apply 1 drop to eye daily as needed (dry eyes)., Disp: , Rfl:  .  potassium chloride (KLOR-CON) 20 MEQ packet, Take 20 mEq by mouth as needed (Daily as needed with fluid pill)., Disp: 30 packet, Rfl: 1 .  prochlorperazine (COMPAZINE) 10 MG tablet, Take 1 tablet (10 mg total) by mouth every 6 (six) hours as needed (Nausea or vomiting)., Disp: 30 tablet, Rfl: 1 .  pyridOXINE (VITAMIN B-6) 100 MG tablet, Take 1 tablet (100 mg total) by mouth daily., Disp: 30 tablet, Rfl: 3 .  senna-docusate (SENOKOT-S) 8.6-50 MG tablet, Take 1 tablet at bedtime as needed by mouth for mild constipation., Disp: , Rfl:  .  traMADol (ULTRAM) 50 MG tablet, Take 1 tablet (50 mg total) by mouth every 6 (six) hours as needed., Disp: 30 tablet, Rfl: 0 .  traZODone (DESYREL) 50 MG tablet, TAKE 1/2 TABLET BY MOUTH AT BEDTIME IF NEEDED FOR SLEEP, Disp: 45 tablet, Rfl: 3 .  Diclofenac Sodium 1 % CREA, Apply to left arm as needed for pain three times daily (Patient not taking: Reported on 12/22/2017), Disp: 120 g, Rfl: 0 .  pantoprazole (PROTONIX) 40 MG tablet, Take 1 tablet (40 mg total) by mouth 2 (two) times daily before a meal., Disp: 60 tablet, Rfl: 1 .  ranitidine (ZANTAC) 150 MG/10ML syrup, Take 10 mLs (150 mg total) by mouth at bedtime., Disp: 300 mL, Rfl: 3  Physical exam:  Vitals:   12/22/17 1054  BP: 122/80  Pulse: 69  Resp: 18  Temp: 97.8 F (36.6 C)  TempSrc: Tympanic  Weight: 206 lb 9.6 oz (93.7 kg)  ECOG 1 Physical Exam  Constitutional: She is oriented to person, place, and time and well-developed, well-nourished, and in no distress.  No distress.  HENT:  Head: Normocephalic and atraumatic.  Nose: Nose normal.  Mouth/Throat: Oropharynx is clear and moist. No oropharyngeal exudate.  Eyes: Pupils are  equal, round, and reactive to light. Conjunctivae and EOM are normal. Left eye exhibits no discharge. No scleral icterus.  Neck: Normal range of motion. Neck supple. No JVD present.  Cardiovascular: Normal rate and regular rhythm.  Murmur heard. Pulmonary/Chest: Effort normal and breath sounds normal. No respiratory distress. She has no wheezes. She has no rales. She exhibits no tenderness.  Abdominal: Soft. Bowel sounds are normal. She exhibits no distension and no mass. There is no tenderness. There is no rebound and no guarding.  Musculoskeletal: Normal range of motion. She exhibits no edema, tenderness or deformity.  Lymphadenopathy:    She has no cervical adenopathy.  Neurological: She is alert and oriented to person, place, and time. No cranial nerve deficit. She exhibits normal muscle tone. Coordination normal.  Skin: Skin is warm and dry. She is not diaphoretic. No erythema.  Dry skin bilateral hands.  no skin peeling.  Psychiatric: Affect and judgment normal.    CMP Latest Ref Rng & Units 12/22/2017  Glucose 70 - 99 mg/dL 121(H)  BUN 8 - 23 mg/dL 15  Creatinine 0.44 - 1.00 mg/dL 0.81  Sodium 135 - 145 mmol/L 144  Potassium 3.5 - 5.1 mmol/L 3.3(L)  Chloride 98 - 111 mmol/L 107  CO2 22 - 32 mmol/L 25  Calcium 8.9 - 10.3 mg/dL 8.9  Total Protein 6.5 - 8.1 g/dL 6.8  Total Bilirubin 0.3 - 1.2 mg/dL 0.7  Alkaline Phos 38 - 126 U/L 83  AST 15 - 41 U/L 32  ALT 0 - 44 U/L 14   CBC Latest Ref Rng & Units 12/22/2017  WBC 3.6 - 11.0 K/uL 5.2  Hemoglobin 12.0 - 16.0 g/dL 12.9  Hematocrit 35.0 - 47.0 % 38.0  Platelets 150 - 440 K/uL 191     Assessment and plan  Cancer Staging Malignant neoplasm of lower third of esophagus (HCC) Staging form: Esophagus - Adenocarcinoma, AJCC 8th Edition - Clinical stage from 04/10/2017: Stage IVB (cTX, cNX, pM1) - Signed by Earlie Server, MD on 04/10/2017  1. Malignant neoplasm of lower third of esophagus (HCC)   2. Encounter for antineoplastic  chemotherapy   3. Neuropathy   4. Stomatitis and mucositis   5. Esophageal stricture    #Esophageal cancer Labs reviewed and discussed with patient.  Acceptable to proceed with today's 5-FU maintenance treatment. Patient request 1 week chemo break after this cycle which I think is reasonable given that she was in complete remission on most recent PET scan. Follow-up in 3 weeks for assessment prior to next 5-FU maintenance treatment.   #Esophageal stricture: Secondary to radiation.  Recurrent issue for her.  Recently had EGD again for dilation.  Follow-up with gastroenterology. Continue Protonix.  .  # Iron deficiency anemia: Stable continue to monitor. # palmar-plantar erythrodysesthesia, Grade 1.  Improved.  Continue to use  [place] Cream to both hands and bottom of feet.  # Chronic neuropathy, continue Lyrica.  Stable. # Mucositis stomatitis, improved.  Continue and finish course of Acyclovir.  # hypokalemia, advise patient to restart Klor-con 58mq daily.   Follow up in 3 weeks for 5- FU treatment.  Patient knows to call if any concerns or questions.   Total face to face encounter time for this patient visit was 25 min. >50% of the time was  spent in counseling and coordination of care.  Earlie Server, MD, PhD Hematology Oncology [facility] at [facility] Pager- [phone] 12/22/17

## 2017-12-23 LAB — CEA: CEA1: 2 ng/mL (ref 0.0–4.7)

## 2017-12-24 ENCOUNTER — Telehealth: Payer: Self-pay

## 2017-12-24 ENCOUNTER — Inpatient Hospital Stay: Payer: Medicare Other

## 2017-12-24 VITALS — BP 129/76 | HR 62 | Temp 97.0°F | Resp 20

## 2017-12-24 DIAGNOSIS — Z5111 Encounter for antineoplastic chemotherapy: Secondary | ICD-10-CM | POA: Diagnosis not present

## 2017-12-24 DIAGNOSIS — C155 Malignant neoplasm of lower third of esophagus: Secondary | ICD-10-CM

## 2017-12-24 DIAGNOSIS — K222 Esophageal obstruction: Secondary | ICD-10-CM

## 2017-12-24 MED ORDER — HEPARIN SOD (PORK) LOCK FLUSH 100 UNIT/ML IV SOLN
500.0000 [IU] | Freq: Once | INTRAVENOUS | Status: AC | PRN
  Administered 2017-12-24: 500 [IU]
  Filled 2017-12-24: qty 5

## 2017-12-24 MED ORDER — SODIUM CHLORIDE 0.9% FLUSH
10.0000 mL | INTRAVENOUS | Status: DC | PRN
  Administered 2017-12-24: 10 mL
  Filled 2017-12-24: qty 10

## 2017-12-24 NOTE — Telephone Encounter (Signed)
lmtco

## 2017-12-24 NOTE — Telephone Encounter (Signed)
-----   Message from Virgel Manifold, MD sent at 12/17/2017 11:50 AM EDT ----- Please schedule EGD in 3 weeks

## 2017-12-25 ENCOUNTER — Other Ambulatory Visit: Payer: Self-pay | Admitting: Family Medicine

## 2017-12-25 ENCOUNTER — Other Ambulatory Visit: Payer: Self-pay | Admitting: Oncology

## 2017-12-25 NOTE — Telephone Encounter (Signed)
Patient left a voice message that she is returning your call to schedule an EGD. Please call

## 2017-12-25 NOTE — Telephone Encounter (Signed)
Pt is calling Debbie back

## 2017-12-26 ENCOUNTER — Other Ambulatory Visit: Payer: Self-pay

## 2017-12-26 DIAGNOSIS — K222 Esophageal obstruction: Secondary | ICD-10-CM

## 2017-12-26 NOTE — Telephone Encounter (Signed)
Pt contacted and EGD is scheduled for 01/08/2018.

## 2018-01-07 ENCOUNTER — Other Ambulatory Visit: Payer: Self-pay | Admitting: Oncology

## 2018-01-08 ENCOUNTER — Ambulatory Visit
Admission: RE | Admit: 2018-01-08 | Discharge: 2018-01-08 | Disposition: A | Discharge status: Home / Self Care | Payer: Medicare Other | Source: Ambulatory Visit | Attending: Gastroenterology | Admitting: Gastroenterology

## 2018-01-08 ENCOUNTER — Ambulatory Visit: Payer: Medicare Other | Admitting: Anesthesiology

## 2018-01-08 ENCOUNTER — Encounter: Payer: Self-pay | Admitting: *Deleted

## 2018-01-08 ENCOUNTER — Encounter
Admission: RE | Disposition: A | Discharge status: Home / Self Care | Payer: Self-pay | Source: Ambulatory Visit | Attending: Gastroenterology

## 2018-01-08 DIAGNOSIS — K222 Esophageal obstruction: Secondary | ICD-10-CM | POA: Insufficient documentation

## 2018-01-08 DIAGNOSIS — J449 Chronic obstructive pulmonary disease, unspecified: Secondary | ICD-10-CM | POA: Insufficient documentation

## 2018-01-08 DIAGNOSIS — K257 Chronic gastric ulcer without hemorrhage or perforation: Secondary | ICD-10-CM

## 2018-01-08 DIAGNOSIS — G4730 Sleep apnea, unspecified: Secondary | ICD-10-CM | POA: Diagnosis not present

## 2018-01-08 DIAGNOSIS — Z87891 Personal history of nicotine dependence: Secondary | ICD-10-CM | POA: Diagnosis not present

## 2018-01-08 DIAGNOSIS — D649 Anemia, unspecified: Secondary | ICD-10-CM | POA: Diagnosis not present

## 2018-01-08 DIAGNOSIS — K2289 Other specified disease of esophagus: Secondary | ICD-10-CM

## 2018-01-08 DIAGNOSIS — K219 Gastro-esophageal reflux disease without esophagitis: Secondary | ICD-10-CM | POA: Diagnosis not present

## 2018-01-08 DIAGNOSIS — I10 Essential (primary) hypertension: Secondary | ICD-10-CM | POA: Diagnosis not present

## 2018-01-08 DIAGNOSIS — K259 Gastric ulcer, unspecified as acute or chronic, without hemorrhage or perforation: Secondary | ICD-10-CM | POA: Insufficient documentation

## 2018-01-08 DIAGNOSIS — K228 Other specified diseases of esophagus: Secondary | ICD-10-CM | POA: Diagnosis not present

## 2018-01-08 HISTORY — PX: ESOPHAGOGASTRODUODENOSCOPY (EGD) WITH PROPOFOL: SHX5813

## 2018-01-08 LAB — KOH PREP

## 2018-01-08 SURGERY — ESOPHAGOGASTRODUODENOSCOPY (EGD) WITH PROPOFOL
Anesthesia: General

## 2018-01-08 MED ORDER — TRIAMCINOLONE ACETONIDE 40 MG/ML IJ SUSP
40.0000 mg | Freq: Once | INTRAMUSCULAR | Status: AC
  Administered 2018-01-08: 40 mg via INTRAMUSCULAR
  Filled 2018-01-08: qty 1

## 2018-01-08 MED ORDER — PROPOFOL 10 MG/ML IV BOLUS
INTRAVENOUS | Status: DC | PRN
  Administered 2018-01-08: 30 mg via INTRAVENOUS
  Administered 2018-01-08: 20 mg via INTRAVENOUS
  Administered 2018-01-08 (×2): 30 mg via INTRAVENOUS
  Administered 2018-01-08: 50 mg via INTRAVENOUS
  Administered 2018-01-08 (×2): 20 mg via INTRAVENOUS

## 2018-01-08 MED ORDER — SODIUM CHLORIDE 0.9 % IJ SOLN
INTRAMUSCULAR | Status: DC | PRN
  Administered 2018-01-08: 5 mL

## 2018-01-08 MED ORDER — PROPOFOL 10 MG/ML IV BOLUS
INTRAVENOUS | Status: AC
  Filled 2018-01-08: qty 20

## 2018-01-08 MED ORDER — SODIUM CHLORIDE 0.9 % IV SOLN
INTRAVENOUS | Status: DC
  Administered 2018-01-08: 1000 mL via INTRAVENOUS

## 2018-01-08 NOTE — Anesthesia Postprocedure Evaluation (Signed)
Anesthesia Post Note  Patient: [name]  Procedure(s) Performed: ESOPHAGOGASTRODUODENOSCOPY (EGD) WITH PROPOFOL (N/A )  Patient location during evaluation: Endoscopy Anesthesia Type: General Level of consciousness: awake and alert Pain management: pain level controlled Vital Signs Assessment: post-procedure vital signs reviewed and stable Respiratory status: spontaneous breathing and respiratory function stable Cardiovascular status: stable Anesthetic complications: no     Last Vitals:  Vitals:   01/08/18 0847 01/08/18 1110  BP: 107/81 136/74  Pulse: (!) 56 63  Resp: 18 18  Temp: (!) 36.1 C (!) 36.1 C  SpO2: 100% 96%    Last Pain:  Vitals:   01/08/18 1110  TempSrc: Tympanic  PainSc: 0-No pain                 [name] K

## 2018-01-08 NOTE — Anesthesia Preprocedure Evaluation (Signed)
Anesthesia Evaluation  Patient identified by MRN, date of birth, ID band Patient awake    Reviewed: Allergy & Precautions, NPO status , Patient's Chart, lab work & pertinent test results  History of Anesthesia Complications Negative for: history of anesthetic complications  Airway Mallampati: III       Dental   Pulmonary asthma , sleep apnea and Continuous Positive Airway Pressure Ventilation , COPD,  COPD inhaler, former smoker,           Cardiovascular hypertension, Pt. on medications and Pt. on home beta blockers (-) Past MI and (-) CHF (-) dysrhythmias + Valvular Problems/Murmurs (murmur, no tx)      Neuro/Psych neg Seizures    GI/Hepatic Neg liver ROS, PUD, GERD  Medicated and Controlled,  Endo/Other  neg diabetes  Renal/GU negative Renal ROS     Musculoskeletal   Abdominal   Peds  Hematology  (+) anemia ,   Anesthesia Other Findings   Reproductive/Obstetrics                             Anesthesia Physical Anesthesia Plan  ASA: III  Anesthesia Plan: General   Post-op Pain Management:    Induction: Intravenous  PONV Risk Score and Plan: 3 and Propofol infusion, TIVA and Treatment may vary due to age or medical condition  Airway Management Planned: Nasal Cannula  Additional Equipment:   Intra-op Plan:   Post-operative Plan:   Informed Consent: I have reviewed the patients History and Physical, chart, labs and discussed the procedure including the risks, benefits and alternatives for the proposed anesthesia with the patient or authorized representative who has indicated his/her understanding and acceptance.     Plan Discussed with:   Anesthesia Plan Comments:         Anesthesia Quick Evaluation

## 2018-01-08 NOTE — Anesthesia Post-op Follow-up Note (Signed)
Anesthesia QCDR form completed.        

## 2018-01-08 NOTE — Op Note (Signed)
[facility] Gastroenterology Patient Name: [name] Procedure Date: 01/08/2018 10:32 AM MRN: [id number] Account #: [id number] Date of Birth: 01-Aug-1934 Admit Type: Outpatient Age: 83 Room: [facility]  ENDO ROOM 3 Gender: Female Note Status: Finalized Procedure:            Upper GI endoscopy Indications:          Stricture of the esophagus, Follow-up of esophageal                        stricture, For therapy of esophageal stricture Providers:            Varnita B. Bonna Gains MD, MD Referring MD:         Elveria Rising. Damita Dunnings, MD (Referring MD) Medicines:            Monitored Anesthesia Care Complications:        No immediate complications. Procedure:            Pre-Anesthesia Assessment:                       - The risks and benefits of the procedure and the                        sedation options and risks were discussed with the                        patient. All questions were answered and informed                        consent was obtained.                       - Patient identification and proposed procedure were                        verified prior to the procedure.                       - ASA Grade Assessment: III - A patient with severe                        systemic disease.                       After obtaining informed consent, the endoscope was                        passed under direct vision. Throughout the procedure,                        the patient's blood pressure, pulse, and oxygen                        saturations were monitored continuously. The Endoscope                        was introduced through the mouth, and advanced to the                        afferent and efferent jejunal loops. The upper GI  endoscopy was accomplished with ease. The patient                        tolerated the procedure well. Findings:      One benign-appearing, intrinsic mild stenosis was found. This stenosis       measured 1 cm (in  length). The stenosis was traversed. A TTS dilator was       passed through the scope. Dilation with a 15-16.5-18 mm balloon dilator       was performed to 15 mm. The dilation site was examined and showed       moderate improvement in luminal narrowing. Area was successfully       injected with 4 mL of triamcinolone (10 mg/mL) for muscle relaxation.       Good heme effect noted after 15 mm balloon dilation and thus further       dilation not performed to prevent complications such as perforation.      White nummular lesions were noted in the distal esophagus. Brushings for       KOH prep were obtained.      One non-bleeding linear gastric ulcer with no stigmata of bleeding was       found at the anastomosis. The lesion was 5 mm in largest dimension.      There is no endoscopic evidence of mass in the entire examined stomach.      The examined jejunum was normal. Impression:           - Benign-appearing esophageal stenosis. Dilated.                        Injected.                       - White nummular lesions in esophageal mucosa.                        Brushings performed.                       - Non-bleeding gastric ulcer with no stigmata of                        bleeding.                       - Normal examined jejunum. Recommendation:       - Repeat upper endoscopy in 2 weeks to assess disease                        activity.                       - Return to GI clinic in 4 weeks.                       - Follow an antireflux regimen.                       - Continue present medications.                       - The findings and recommendations were discussed with  the patient.                       - The findings and recommendations were discussed with                        the patient's family.                       - Avoid NSAIDs except Aspirin if medically indicated Procedure Code(s):    --- Professional ---                       [phone],  Esophagogastroduodenoscopy, flexible, transoral;                        with transendoscopic balloon dilation of esophagus                        (less than 30 mm diameter)                       43236, 59, Esophagogastroduodenoscopy, flexible,                        transoral; with directed submucosal injection(s), any                        substance Diagnosis Code(s):    --- Professional ---                       K22.2, Esophageal obstruction                       K22.8, Other specified diseases of esophagus                       K25.9, Gastric ulcer, unspecified as acute or chronic,                        without hemorrhage or perforation CPT copyright 2017 American Medical Association. All rights reserved. The codes documented in this report are preliminary and upon coder review may  be revised to meet current compliance requirements.  [name], MD Margretta Sidle B. Bonna Gains MD, MD 01/08/2018 11:18:17 AM This report has been signed electronically. Number of Addenda: 0 Note Initiated On: 01/08/2018 10:32 AM Estimated Blood Loss: Estimated blood loss: none.      [facility]

## 2018-01-08 NOTE — Transfer of Care (Signed)
Immediate Anesthesia Transfer of Care Note  Patient: [name]  Procedure(s) Performed: ESOPHAGOGASTRODUODENOSCOPY (EGD) WITH PROPOFOL (N/A )  Patient Location: Endoscopy Unit  Anesthesia Type:General  Level of Consciousness: drowsy and patient cooperative  Airway & Oxygen Therapy: Patient Spontanous Breathing and Patient connected to nasal cannula oxygen  Post-op Assessment: Report given to RN and Post -op Vital signs reviewed and stable  Post vital signs: Reviewed and stable  Last Vitals:  Vitals Value Taken Time  BP 136/74 01/08/2018 11:10 AM  Temp    Pulse 68 01/08/2018 11:10 AM  Resp 16 01/08/2018 11:10 AM  SpO2 96 % 01/08/2018 11:10 AM    Last Pain:  Vitals:   01/08/18 0847  TempSrc: Tympanic  PainSc: 0-No pain         Complications: No apparent anesthesia complications

## 2018-01-09 ENCOUNTER — Other Ambulatory Visit: Payer: Self-pay | Admitting: Gastroenterology

## 2018-01-09 ENCOUNTER — Other Ambulatory Visit: Payer: Self-pay | Admitting: Oncology

## 2018-01-09 DIAGNOSIS — C155 Malignant neoplasm of lower third of esophagus: Secondary | ICD-10-CM

## 2018-01-09 MED ORDER — FLUCONAZOLE 200 MG PO TABS: ORAL_TABLET | ORAL | 0 refills | Status: AC

## 2018-01-13 ENCOUNTER — Inpatient Hospital Stay: Payer: Medicare Other

## 2018-01-13 ENCOUNTER — Inpatient Hospital Stay (HOSPITAL_BASED_OUTPATIENT_CLINIC_OR_DEPARTMENT_OTHER): Payer: Medicare Other | Admitting: Oncology

## 2018-01-13 ENCOUNTER — Inpatient Hospital Stay: Payer: Medicare Other | Attending: Oncology

## 2018-01-13 ENCOUNTER — Other Ambulatory Visit: Payer: Self-pay

## 2018-01-13 ENCOUNTER — Encounter: Payer: Self-pay | Admitting: Gastroenterology

## 2018-01-13 VITALS — BP 129/83 | HR 58 | Temp 98.1°F | Resp 16 | Wt 205.1 lb

## 2018-01-13 DIAGNOSIS — M1990 Unspecified osteoarthritis, unspecified site: Secondary | ICD-10-CM | POA: Insufficient documentation

## 2018-01-13 DIAGNOSIS — R634 Abnormal weight loss: Secondary | ICD-10-CM

## 2018-01-13 DIAGNOSIS — C155 Malignant neoplasm of lower third of esophagus: Secondary | ICD-10-CM

## 2018-01-13 DIAGNOSIS — R1032 Left lower quadrant pain: Secondary | ICD-10-CM | POA: Insufficient documentation

## 2018-01-13 DIAGNOSIS — R5381 Other malaise: Secondary | ICD-10-CM | POA: Diagnosis not present

## 2018-01-13 DIAGNOSIS — Z923 Personal history of irradiation: Secondary | ICD-10-CM

## 2018-01-13 DIAGNOSIS — G4733 Obstructive sleep apnea (adult) (pediatric): Secondary | ICD-10-CM | POA: Diagnosis not present

## 2018-01-13 DIAGNOSIS — G629 Polyneuropathy, unspecified: Secondary | ICD-10-CM | POA: Diagnosis not present

## 2018-01-13 DIAGNOSIS — R630 Anorexia: Secondary | ICD-10-CM | POA: Diagnosis not present

## 2018-01-13 DIAGNOSIS — Z803 Family history of malignant neoplasm of breast: Secondary | ICD-10-CM

## 2018-01-13 DIAGNOSIS — C787 Secondary malignant neoplasm of liver and intrahepatic bile duct: Secondary | ICD-10-CM | POA: Insufficient documentation

## 2018-01-13 DIAGNOSIS — Z9989 Dependence on other enabling machines and devices: Secondary | ICD-10-CM | POA: Diagnosis not present

## 2018-01-13 DIAGNOSIS — D509 Iron deficiency anemia, unspecified: Secondary | ICD-10-CM | POA: Insufficient documentation

## 2018-01-13 DIAGNOSIS — R188 Other ascites: Secondary | ICD-10-CM | POA: Diagnosis not present

## 2018-01-13 DIAGNOSIS — R5383 Other fatigue: Secondary | ICD-10-CM | POA: Diagnosis not present

## 2018-01-13 DIAGNOSIS — Z87891 Personal history of nicotine dependence: Secondary | ICD-10-CM

## 2018-01-13 DIAGNOSIS — K222 Esophageal obstruction: Secondary | ICD-10-CM | POA: Insufficient documentation

## 2018-01-13 DIAGNOSIS — Y842 Radiological procedure and radiotherapy as the cause of abnormal reaction of the patient, or of later complication, without mention of misadventure at the time of the procedure: Secondary | ICD-10-CM | POA: Insufficient documentation

## 2018-01-13 DIAGNOSIS — J449 Chronic obstructive pulmonary disease, unspecified: Secondary | ICD-10-CM

## 2018-01-13 DIAGNOSIS — K1230 Oral mucositis (ulcerative), unspecified: Secondary | ICD-10-CM | POA: Insufficient documentation

## 2018-01-13 DIAGNOSIS — Z85820 Personal history of malignant melanoma of skin: Secondary | ICD-10-CM | POA: Insufficient documentation

## 2018-01-13 DIAGNOSIS — R011 Cardiac murmur, unspecified: Secondary | ICD-10-CM | POA: Diagnosis not present

## 2018-01-13 DIAGNOSIS — L271 Localized skin eruption due to drugs and medicaments taken internally: Secondary | ICD-10-CM | POA: Diagnosis not present

## 2018-01-13 DIAGNOSIS — Z5111 Encounter for antineoplastic chemotherapy: Secondary | ICD-10-CM | POA: Insufficient documentation

## 2018-01-13 DIAGNOSIS — Z5112 Encounter for antineoplastic immunotherapy: Secondary | ICD-10-CM | POA: Diagnosis not present

## 2018-01-13 DIAGNOSIS — K219 Gastro-esophageal reflux disease without esophagitis: Secondary | ICD-10-CM

## 2018-01-13 DIAGNOSIS — Z9884 Bariatric surgery status: Secondary | ICD-10-CM | POA: Diagnosis not present

## 2018-01-13 DIAGNOSIS — Z87442 Personal history of urinary calculi: Secondary | ICD-10-CM | POA: Insufficient documentation

## 2018-01-13 DIAGNOSIS — Z79899 Other long term (current) drug therapy: Secondary | ICD-10-CM | POA: Diagnosis not present

## 2018-01-13 DIAGNOSIS — I10 Essential (primary) hypertension: Secondary | ICD-10-CM | POA: Diagnosis not present

## 2018-01-13 LAB — COMPREHENSIVE METABOLIC PANEL
ALK PHOS: 83 U/L (ref 38–126)
ALT: 13 U/L (ref 0–44)
ANION GAP: 10 (ref 5–15)
AST: 30 U/L (ref 15–41)
Albumin: 3.5 g/dL (ref 3.5–5.0)
BILIRUBIN TOTAL: 0.6 mg/dL (ref 0.3–1.2)
BUN: 19 mg/dL (ref 8–23)
CALCIUM: 8.9 mg/dL (ref 8.9–10.3)
CO2: 29 mmol/L (ref 22–32)
CREATININE: 0.89 mg/dL (ref 0.44–1.00)
Chloride: 103 mmol/L (ref 98–111)
GFR calc Af Amer: >60 mL/min (ref >60)
GFR, EST NON AFRICAN AMERICAN: 59 mL/min — AB (ref >60)
Glucose, Bld: 95 mg/dL (ref 70–99)
Potassium: 4.2 mmol/L (ref 3.5–5.1)
SODIUM: 142 mmol/L (ref 135–145)
TOTAL PROTEIN: 6.8 g/dL (ref 6.5–8.1)

## 2018-01-13 LAB — CBC WITH DIFFERENTIAL/PLATELET
BASOS ABS: 0 10*3/uL (ref 0–0.1)
BASOS PCT: 1 %
EOS ABS: 0.1 10*3/uL (ref 0–0.7)
Eosinophils Relative: 2 %
HCT: 36.1 % (ref 35.0–47.0)
Hemoglobin: 12.4 g/dL (ref 12.0–16.0)
Lymphocytes Relative: 12 %
Lymphs Abs: 0.5 10*3/uL — ABNORMAL LOW (ref 1.0–3.6)
MCH: 34.3 pg — ABNORMAL HIGH (ref 26.0–34.0)
MCHC: 34.3 g/dL (ref 32.0–36.0)
MCV: 99.9 fL (ref 80.0–100.0)
MONOS PCT: 16 %
Monocytes Absolute: 0.7 10*3/uL (ref 0.2–0.9)
Neutro Abs: 2.8 10*3/uL (ref 1.4–6.5)
Neutrophils Relative %: 69 %
Platelets: 223 10*3/uL (ref 150–440)
RBC: 3.62 MIL/uL — ABNORMAL LOW (ref 3.80–5.20)
RDW: 15.7 % — AB (ref 11.5–14.5)
WBC: 4 10*3/uL (ref 3.6–11.0)

## 2018-01-13 MED ORDER — DEXTROSE 5 % IV SOLN
Freq: Once | INTRAVENOUS | Status: AC
  Administered 2018-01-13: 09:00:00 via INTRAVENOUS
  Filled 2018-01-13: qty 250

## 2018-01-13 MED ORDER — SODIUM CHLORIDE 0.9% FLUSH
10.0000 mL | Freq: Once | INTRAVENOUS | Status: AC
  Administered 2018-01-13: 10 mL via INTRAVENOUS
  Filled 2018-01-13: qty 10

## 2018-01-13 MED ORDER — LEUCOVORIN CALCIUM INJECTION 350 MG: 400.0000 mg/m2 | Freq: Once | INTRAVENOUS | Status: DC

## 2018-01-13 MED ORDER — FLUOROURACIL CHEMO INJECTION 2.5 GM/50ML
400.0000 mg/m2 | Freq: Once | INTRAVENOUS | Status: AC
  Administered 2018-01-13: 850 mg via INTRAVENOUS
  Filled 2018-01-13: qty 17

## 2018-01-13 MED ORDER — LEUCOVORIN CALCIUM INJECTION 100 MG
20.0000 mg/m2 | Freq: Once | INTRAMUSCULAR | Status: AC
  Administered 2018-01-13: 44 mg via INTRAVENOUS
  Filled 2018-01-13: qty 2.2

## 2018-01-13 MED ORDER — SODIUM CHLORIDE 0.9 % IV SOLN
2400.0000 mg/m2 | INTRAVENOUS | Status: DC
  Administered 2018-01-13: 5200 mg via INTRAVENOUS
  Filled 2018-01-13: qty 100

## 2018-01-13 MED ORDER — HEPARIN SOD (PORK) LOCK FLUSH 100 UNIT/ML IV SOLN: 500.0000 [IU] | Freq: Once | INTRAVENOUS | Status: DC

## 2018-01-13 NOTE — Progress Notes (Signed)
Patient here today for follow up and chemotherapy.  

## 2018-01-13 NOTE — Progress Notes (Signed)
Hematology/Oncology Follow Up Note [facility]  Telephone:([phone] [phone]  Patient Care Team: Tonia Ghent, MD as PCP - General (Family Medicine)   Name of the patient: [name]  [id number]  1934/06/20   REASON FOR VISIT Follow up of chemotherapy management of esophageal adenocarcinoma  HISTORY OF PRESENT ILLNESS Patient is a 83 year old female who has metastatic esophageal cancer with liver involvement.  Weight loss 20 pounds for the past year prior to diagnosis.  # EGD during admission showed partially obstructive esophageal mass at the distal part of esophagus. Biopsy was taken. Pathology showed esophageal adenocarcinoma. #US biopsy of liver lesion to proof distant metastasis.  Report family history of breast cancer, but she has been having routine mammograms.  # Lives alone. She's a former smoker.  # Molecular Testing:  #[employer] Cdx: No reportable alternations with companion diagnostic (CDx) claims.  MS stable Tumor mutation burden: Cannot be determined, CCND3 amplification: Equivocal.  Amended reports on 06/04/2017, Tumor mutational burden changed from can not be determined to "TMB -low" on the re-analyzed samples.  # HER 2 negative.   Current Treatment S/p 6 cycles of FOLFOX, PET scan showed excellent partial response from both chemotherapy and radiation, result was discussed with patient.  currently on 5-FU maintenance.  S/p palliative radiation in January 2019 # Developed esophageal stricture secondary to radiation in April and got dilation via endoscopy. EGD shoed Benign-appearing esophageal stenosis. This is a result of radiation therapy.- LA Grade C radiation esophagitis. - The previously noted esophageal mass in Nov 2018 was much decreased indicating good response to radiation/chemotherapy. - Normal stomach.- Normal examined jejunum.- No specimens collected.    INTERVAL HISTORY  Patient presents for assessment  prior to maintenance 5-FU treatment.  #Chemotherapy, tolerates well except that she had developed "mouth sores" after last cycles of treatment.  Symptoms resolved already.  She uses Magic mouthwash as needed.  Denies nausea vomiting.  She has formed bowel movement every day.  No diarrhea.  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  She has had multiple EGD for dilation and recently just had one.  Dysphagia is better and she just had a EGD for dilation recently.  Denies any swallowing difficulty.  #Chronic neuropathy, pre-existing.  Stable.  Pre-existing prior to the exposure to oxaliplatin.   #Dry skin on her hands.  Getting better. #Fatigue, improved.   Review of systems Review of Systems  Constitutional: Positive for malaise/fatigue. Negative for chills, diaphoresis, fever and weight loss.  HENT: Negative for ear discharge, ear pain, hearing loss, nosebleeds and sore throat.        Mouth sore getting better, less painful.  Eyes: Negative for double vision, photophobia, pain, discharge and redness.  Respiratory: Negative for cough, hemoptysis, shortness of breath and wheezing.   Cardiovascular: Negative for chest pain, palpitations, orthopnea, claudication and leg swelling.  Gastrointestinal: Negative for abdominal pain, blood in stool, constipation, diarrhea, heartburn, melena, nausea and vomiting.       Dysphagia is better  Genitourinary: Negative for dysuria, flank pain, frequency, hematuria and urgency.  Musculoskeletal: Negative for back pain, joint pain, myalgias and neck pain.  Skin: Negative for itching and rash.       Dry skin on her hands.  Neurological: Positive for tingling and sensory change. Negative for dizziness, tremors, focal weakness, weakness and headaches.  Endo/Heme/Allergies: Negative for environmental allergies. Does not bruise/bleed easily.  Psychiatric/Behavioral: Negative for depression, hallucinations, substance abuse and suicidal ideas.  The patient  is not nervous/anxious.     Allergies  Allergen Reactions  . Cefuroxime Nausea Only  . Codeine Nausea Only  . Doxycycline Other (See Comments)    Lip swelling  . Gabapentin Other (See Comments)    Swelling.   . Iohexol Itching    07-15-13 pt developed itching on fingers after contrast given. Dr. Irish Elders looked at pt and said to put in system as allergy. BB  . Other Other (See Comments)    Contrast Dye- caused fever, chills, awful feeling Bandages - itching, rash  . Oxycodone Other (See Comments)    nausea  . Quinolones Swelling    Lip swelling  . Synvisc [Hylan G-F 20] Swelling     Past Medical History:  Diagnosis Date  . Arthritis   . Asthma   . COPD (chronic obstructive pulmonary disease) ([place])   . Dysphonia   . Dyspnea   . Esophageal cancer ([place])   . GERD (gastroesophageal reflux disease)   . Heart murmur   . History of kidney stones   . Hypertension   . Hypokalemia 10/07/2017  . Melanoma ([place])   . OAB (overactive bladder)   . OSA on CPAP   . 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 Personal history of chemotherapy    esophageal cancer  . Personal history of radiation therapy    esophageal cancer  . Pupil asymmetry    R pupil defect  . Skin cancer   . Sleep apnea   . Spinal cord abscess      Past Surgical History:  Procedure Laterality Date  . ABDOMINAL HYSTERECTOMY    . ANTERIOR CERVICAL DECOMP/DISCECTOMY FUSION N/A 07/16/2013   Procedure: ANTERIOR CERVICAL DECOMPRESSION/DISCECTOMY FUSION mutlipleLEVELS C4-7;  Surgeon: Erline Levine, MD;  Location: [place] [facility]O [facility]S;  Service: Neurosurgery;  Laterality: N/A;  . APPENDECTOMY    . carpel tunn Bilateral   . CATARACT EXTRACTION    . CATARACT EXTRACTION, BILATERAL    . CHOLECYSTECTOMY    . dental implant    . ESOPHAGOGASTRODUODENOSCOPY Left 03/21/2017   Procedure: ESOPHAGOGASTRODUODENOSCOPY (EGD);  Surgeon: Virgel Manifold, MD;  Location: [facility] ENDOSCOPY;  Service: Endoscopy;   Laterality: Left;  . ESOPHAGOGASTRODUODENOSCOPY (EGD) WITH PROPOFOL N/A 08/20/2017   Procedure: ESOPHAGOGASTRODUODENOSCOPY (EGD) WITH PROPOFOL;  Surgeon: Virgel Manifold, MD;  Location: [facility] ENDOSCOPY;  Service: Endoscopy;  Laterality: N/A;  . ESOPHAGOGASTRODUODENOSCOPY (EGD) WITH PROPOFOL N/A 09/19/2017   Procedure: ESOPHAGOGASTRODUODENOSCOPY (EGD) WITH PROPOFOL;  Surgeon: Virgel Manifold, MD;  Location: [facility] ENDOSCOPY;  Service: Endoscopy;  Laterality: N/A;  . ESOPHAGOGASTRODUODENOSCOPY (EGD) WITH PROPOFOL N/A 10/03/2017   Procedure: ESOPHAGOGASTRODUODENOSCOPY (EGD) WITH PROPOFOL;  Surgeon: Virgel Manifold, MD;  Location: [facility] ENDOSCOPY;  Service: Endoscopy;  Laterality: N/A;  . ESOPHAGOGASTRODUODENOSCOPY (EGD) WITH PROPOFOL N/A 10/22/2017   Procedure: ESOPHAGOGASTRODUODENOSCOPY (EGD) WITH PROPOFOL;  Surgeon: Virgel Manifold, MD;  Location: [facility] ENDOSCOPY;  Service: Endoscopy;  Laterality: N/A;  . ESOPHAGOGASTRODUODENOSCOPY (EGD) WITH PROPOFOL N/A 11/18/2017   Procedure: ESOPHAGOGASTRODUODENOSCOPY (EGD) WITH PROPOFOL;  Surgeon: Virgel Manifold, MD;  Location: [facility] ENDOSCOPY;  Service: Endoscopy;  Laterality: N/A;  . ESOPHAGOGASTRODUODENOSCOPY (EGD) WITH PROPOFOL N/A 12/17/2017   Procedure: ESOPHAGOGASTRODUODENOSCOPY (EGD) WITH PROPOFOL;  Surgeon: Virgel Manifold, MD;  Location: [facility] ENDOSCOPY;  Service: Endoscopy;  Laterality: N/A;  . ESOPHAGOGASTRODUODENOSCOPY (EGD) WITH PROPOFOL N/A 01/08/2018   Procedure: ESOPHAGOGASTRODUODENOSCOPY (EGD) WITH PROPOFOL;  Surgeon: Virgel Manifold, MD;  Location: [facility] ENDOSCOPY;  Service: Endoscopy;  Laterality: N/A;  . EYE SURGERY    .  GASTRIC BYPASS    . HERNIA REPAIR    . HIATAL HERNIA REPAIR    . JOINT REPLACEMENT    . MELANOMA EXCISION    . PORTA CATH INSERTION N/A 04/07/2017   Procedure: PORTA CATH INSERTION;  Surgeon: Algernon Huxley, MD;  Location: [place] CV LAB;  Service: Cardiovascular;  Laterality: N/A;  . POSTERIOR  CERVICAL FUSION/FORAMINOTOMY N/A 07/28/2013   Procedure: Cervical four-seven  posterior cervical fusion;  Surgeon: Erline Levine, MD;  Location: [place]URO ORS;  Service: Neurosurgery;  Laterality: N/A;  . TONSILLECTOMY    . TOTAL KNEE ARTHROPLASTY      Social History   Socioeconomic History  . Marital status: Widowed    Spouse name: Not on file  . Number of children: 2  . Years of education: Not on file  . Highest education level: Master's degree (e.g., MA, MS, MEng, MEd, MSW, MBA)  Occupational History  . Not on file  Social Needs  . Financial resource strain: Not hard at all  . Food insecurity:    Worry: Never true    Inability: Never true  . Transportation needs:    Medical: No    Non-medical: No  Tobacco Use  . Smoking status: Former Smoker    Packs/day: 1.00    Years: 20.00    Pack years: 20.00    Types: Cigarettes    Last attempt to quit: 1977    Years since quitting: 42.6  . Smokeless tobacco: Never Used  Substance and Sexual Activity  . Alcohol use: No  . Drug use: Never  . Sexual activity: Never  Lifestyle  . Physical activity:    Days per week: 2 days    Minutes per session: 30 min  . Stress: Not at all  Relationships  . Social connections:    Talks on phone: More than three times a week    Gets together: More than three times a week    Attends religious service: More than 4 times per year    Active member of club or organization: Yes    Attends meetings of clubs or organizations: More than 4 times per year    Relationship status: Widowed  . Intimate partner violence:    Fear of current or ex partner: No    Emotionally abused: No    Physically abused: No    Forced sexual activity: No  Other Topics Concern  . Not on file  Social History Narrative   Marital Status- Widowed    Lives by herself    Employement- Retired Pharmacist, hospital   Exercise hx- Does PT     Family History  Problem Relation Age of Onset  . Breast cancer Mother 64  . Arthritis-Osteo  Mother   . Breast cancer Sister 61  . Bladder Cancer Sister   . Bladder Cancer Father   . Tongue cancer Father   . Congenital heart disease Father   . Prostate cancer Brother   . Uterine cancer Maternal Aunt   . Lung cancer Paternal Uncle   . Leukemia Maternal Grandmother   . Liver cancer Paternal Grandmother   . Colon cancer Neg Hx      Current Outpatient Medications:  .  acetaminophen (TYLENOL) 325 MG tablet, Take 650 mg by mouth every 6 (six) hours as needed for moderate pain or headache. , Disp: , Rfl:  .  acyclovir (ZOVIRAX) 400 MG tablet, Take 1 tablet (400 mg total) by mouth 2 (two) times daily., Disp: 30 tablet, Rfl:  0 .  ALBUTEROL SULFATE HFA IN, Inhale into the lungs. , Disp: , Rfl:  .  bisacodyl (DULCOLAX) 10 MG suppository, Place rectally., Disp: , Rfl:  .  calcium carbonate (TUMS EX) 750 MG chewable tablet, Chew by mouth., Disp: , Rfl:  .  CARAFATE 1 GM/10ML suspension, TAKE 10MLS BY MOUTH FOUR TIMES DAILY, Disp: 420 mL, Rfl: 1 .  Cholecalciferol (VITAMIN D) 2000 UNITS CAPS, Take 2,000 Units by mouth daily., Disp: , Rfl:  .  cyanocobalamin 500 MCG tablet, Take 500 mcg every other day by mouth., Disp: , Rfl:  .  Diclofenac Sodium 1 % CREA, Apply to left arm as needed for pain three times daily, Disp: 120 g, Rfl: 0 .  fluconazole (DIFLUCAN) 200 MG tablet, Take 2 tablets (400 mg total) by mouth daily for 1 day, THEN 1 tablet (200 mg total) daily for 14 days., Disp: 16 tablet, Rfl: 0 .  fluticasone (FLONASE) 50 MCG/ACT nasal spray, Place 1-2 sprays into both nostrils daily., Disp: 16 g, Rfl: 5 .  furosemide (LASIX) 20 MG tablet, Take 1 tablet (20 mg total) by mouth daily., Disp: 90 tablet, Rfl: 3 .  hydrocortisone cream 1 %, Apply 1 application topically daily as needed for itching. , Disp: , Rfl:  .  lidocaine-prilocaine (EMLA) cream, Apply to affected area once, Disp: 30 g, Rfl: 3 .  loratadine (CLARITIN) 10 MG tablet, Take 10 mg by mouth daily., Disp: , Rfl:  .  LYRICA 150  MG capsule, TAKE 1 CAPSULE BY MOUTH TWO TIMES DAILY, Disp: 180 capsule, Rfl: 1 .  metoprolol succinate (TOPROL-XL) 50 MG 24 hr tablet, TAKE 1 TABLET BY MOUTH DAILY, Disp: 90 tablet, Rfl: 1 .  ondansetron (ZOFRAN) 8 MG tablet, Take 1 tablet (8 mg total) by mouth 2 (two) times daily as needed for refractory nausea / vomiting. Start on day 3 after chemotherapy., Disp: 30 tablet, Rfl: 1 .  oxybutynin (DITROPAN) 5 MG tablet, TAKE ONE TABLET BY MOUTH EVERY MORNING AND TAKE ONE-HALF TABLET AT BEDTIME, Disp: 135 tablet, Rfl: 0 .  pantoprazole (PROTONIX) 40 MG tablet, Take 40 mg by mouth daily., Disp: , Rfl:  .  Pediatric Multiple Vitamins (FLINTSTONES MULTIVITAMIN PO), Take by mouth., Disp: , Rfl:  .  Polyethyl Glycol-Propyl Glycol (SYSTANE OP), Apply 1 drop to eye daily as needed (dry eyes)., Disp: , Rfl:  .  potassium chloride (KLOR-CON) 20 MEQ packet, Take 20 mEq by mouth as needed (Daily as needed with fluid pill)., Disp: 30 packet, Rfl: 1 .  prochlorperazine (COMPAZINE) 10 MG tablet, Take 1 tablet (10 mg total) by mouth every 6 (six) hours as needed (Nausea or vomiting)., Disp: 30 tablet, Rfl: 1 .  pyridOXINE (VITAMIN B-6) 100 MG tablet, Take 1 tablet (100 mg total) by mouth daily., Disp: 30 tablet, Rfl: 3 .  senna-docusate (SENOKOT-S) 8.6-50 MG tablet, Take 1 tablet at bedtime as needed by mouth for mild constipation., Disp: , Rfl:  .  traMADol (ULTRAM) 50 MG tablet, Take 1 tablet (50 mg total) by mouth every 6 (six) hours as needed., Disp: 30 tablet, Rfl: 0 .  traZODone (DESYREL) 50 MG tablet, TAKE 1/2 TABLET BY MOUTH AT BEDTIME IF NEEDED FOR SLEEP, Disp: 45 tablet, Rfl: 3 .  pantoprazole (PROTONIX) 40 MG tablet, Take 1 tablet (40 mg total) by mouth 2 (two) times daily before a meal., Disp: 60 tablet, Rfl: 1 .  ranitidine (ZANTAC) 150 MG/10ML syrup, Take 10 mLs (150 mg total) by mouth at bedtime., Disp:  300 mL, Rfl: 3 No current facility-administered medications for this visit.    Facility-Administered Medications Ordered in Other Visits:  .  fluorouracil (ADRUCIL) 5,200 mg in sodium chloride 0.9 % 146 mL chemo infusion, 2,400 mg/m2 (Treatment Plan Recorded), Intravenous, 1 day or 1 dose, Earlie Server, MD, 5,200 mg at 01/13/18 1025 .  fluorouracil (ADRUCIL) chemo injection 850 mg, 400 mg/m2 (Treatment Plan Recorded), Intravenous, Once, Earlie Server, MD, 850 mg at 01/13/18 1020 .  heparin lock flush 100 unit/mL, 500 Units, Intravenous, Once, Earlie Server, MD  Physical exam:  Vitals:   01/13/18 0859  BP: 129/83  Pulse: (!) 58  Resp: 16  Temp: 98.1 F (36.7 C)  TempSrc: Tympanic  Weight: 205 lb 2 oz (93 kg)  ECOG 1 Physical Exam  Constitutional: She is oriented to person, place, and time and well-developed, well-nourished, and in no distress. No distress.  HENT:  Head: Normocephalic and atraumatic.  Nose: Nose normal.  Mouth/Throat: Oropharynx is clear and moist. No oropharyngeal exudate.  Eyes: Pupils are equal, round, and reactive to light. Conjunctivae and EOM are normal. Left eye exhibits no discharge. No scleral icterus.  Neck: Normal range of motion. Neck supple. No JVD present.  Cardiovascular: Normal rate and regular rhythm.  Murmur heard. Pulmonary/Chest: Effort normal and breath sounds normal. No respiratory distress. She has no wheezes. She has no rales. She exhibits no tenderness.  Abdominal: Soft. Bowel sounds are normal. She exhibits no distension and no mass. There is no tenderness. There is no rebound and no guarding.  Musculoskeletal: Normal range of motion. She exhibits no edema, tenderness or deformity.  Lymphadenopathy:    She has no cervical adenopathy.  Neurological: She is alert and oriented to person, place, and time. No cranial nerve deficit. She exhibits normal muscle tone. Coordination normal.  Skin: Skin is warm and dry. She is not diaphoretic. No erythema.  Dry skin bilateral hands.  no skin peeling.  Psychiatric: Affect and judgment normal.     CMP Latest Ref Rng & Units 01/13/2018  Glucose 70 - 99 mg/dL 95  BUN 8 - 23 mg/dL 19  Creatinine 0.44 - 1.00 mg/dL 0.89  Sodium 135 - 145 mmol/L 142  Potassium 3.5 - 5.1 mmol/L 4.2  Chloride 98 - 111 mmol/L 103  CO2 22 - 32 mmol/L 29  Calcium 8.9 - 10.3 mg/dL 8.9  Total Protein 6.5 - 8.1 g/dL 6.8  Total Bilirubin 0.3 - 1.2 mg/dL 0.6  Alkaline Phos 38 - 126 U/L 83  AST 15 - 41 U/L 30  ALT 0 - 44 U/L 13   CBC Latest Ref Rng & Units 01/13/2018  WBC 3.6 - 11.0 K/uL 4.0  Hemoglobin 12.0 - 16.0 g/dL 12.4  Hematocrit 35.0 - 47.0 % 36.1  Platelets 150 - 440 K/uL 223     Assessment and plan  Cancer Staging Malignant neoplasm of lower third of esophagus (HCC) Staging form: Esophagus - Adenocarcinoma, AJCC 8th Edition - Clinical stage from 04/10/2017: Stage IVB (cTX, cNX, pM1) - Signed by Earlie Server, MD on 04/10/2017  1. Malignant neoplasm of lower third of esophagus (HCC)   2. Encounter for antineoplastic chemotherapy   3. Esophageal stricture    #Esophageal cancer Labs reviewed and discussed with patient.  Acceptable to proceed with today's 5-FU maintenance treatment. This treatment was delayed for 1 week as patient request to have a chemo break for family activity/vacation. recent PET scan prior to next visit. Follow-up in 2 weeks for assessment prior to next  5-FU maintenance treatment.   #Esophageal stricture: Secondary to radiation.  Recurrent issue for her.  Recently had EGD again for dilation.  Follow-up with gastroenterology.  Continue Protonix.  Appears improving.  # Iron deficiency anemia: Stable continue to monitor.   # palmar-plantar erythrodysesthesia, Grade 1.  Improved.  Continue use [place] Cream to both hands and bottom of feet.  Discussed with patient. # Chronic neuropathy, .  Stable.  Continue Lyrica. # hypokalemia, resolved.  Advised patient to continue potassium supplements 20 mEq daily.  Follow up in 2 weeks for 5- FU treatment.  Patient knows to call if any  concerns or questions.   Total face to face encounter time for this patient visit was 25 min. >50% of the time was  spent in counseling and coordination of care.    Earlie Server, MD, PhD Hematology Oncology [facility] Pager- [phone] 01/13/18

## 2018-01-14 ENCOUNTER — Other Ambulatory Visit: Payer: Self-pay

## 2018-01-14 ENCOUNTER — Telehealth: Payer: Self-pay

## 2018-01-14 DIAGNOSIS — K222 Esophageal obstruction: Secondary | ICD-10-CM

## 2018-01-14 LAB — CEA: CEA: 1.9 ng/mL (ref 0.0–4.7)

## 2018-01-14 NOTE — Telephone Encounter (Signed)
Pt called and scheduled her EGD 3 weeks, per Dr. Bonna Gains. I did let pt know that the doctor's note said 2 weeks but 3 weeks was good for her. Scheduled for 01/22/18.

## 2018-01-14 NOTE — Telephone Encounter (Signed)
-----   Message from Virgel Manifold, MD sent at 01/08/2018 11:50 AM EDT ----- Schedule for repeat EGD in 2 weeks from today. Thank you

## 2018-01-15 ENCOUNTER — Inpatient Hospital Stay: Payer: Medicare Other

## 2018-01-15 VITALS — BP 109/68 | HR 69 | Temp 98.5°F | Resp 18

## 2018-01-15 DIAGNOSIS — C155 Malignant neoplasm of lower third of esophagus: Secondary | ICD-10-CM | POA: Diagnosis not present

## 2018-01-15 MED ORDER — HEPARIN SOD (PORK) LOCK FLUSH 100 UNIT/ML IV SOLN
500.0000 [IU] | Freq: Once | INTRAVENOUS | Status: AC | PRN
  Administered 2018-01-15: 500 [IU]
  Filled 2018-01-15: qty 5

## 2018-01-15 MED ORDER — SODIUM CHLORIDE 0.9% FLUSH
10.0000 mL | INTRAVENOUS | Status: DC | PRN
  Administered 2018-01-15: 10 mL
  Filled 2018-01-15: qty 10

## 2018-01-21 ENCOUNTER — Encounter: Payer: Self-pay | Admitting: *Deleted

## 2018-01-22 ENCOUNTER — Ambulatory Visit: Payer: Medicare Other | Admitting: Registered Nurse

## 2018-01-22 ENCOUNTER — Ambulatory Visit
Admission: RE | Admit: 2018-01-22 | Discharge: 2018-01-22 | Disposition: A | Discharge status: Home / Self Care | Payer: Medicare Other | Source: Ambulatory Visit | Attending: Gastroenterology | Admitting: Gastroenterology

## 2018-01-22 ENCOUNTER — Encounter
Admission: RE | Disposition: A | Discharge status: Home / Self Care | Payer: Self-pay | Source: Ambulatory Visit | Attending: Gastroenterology

## 2018-01-22 ENCOUNTER — Encounter: Payer: Self-pay | Admitting: *Deleted

## 2018-01-22 DIAGNOSIS — K219 Gastro-esophageal reflux disease without esophagitis: Secondary | ICD-10-CM | POA: Diagnosis not present

## 2018-01-22 DIAGNOSIS — G4733 Obstructive sleep apnea (adult) (pediatric): Secondary | ICD-10-CM | POA: Insufficient documentation

## 2018-01-22 DIAGNOSIS — M1990 Unspecified osteoarthritis, unspecified site: Secondary | ICD-10-CM | POA: Diagnosis not present

## 2018-01-22 DIAGNOSIS — I10 Essential (primary) hypertension: Secondary | ICD-10-CM | POA: Insufficient documentation

## 2018-01-22 DIAGNOSIS — Z885 Allergy status to narcotic agent status: Secondary | ICD-10-CM | POA: Diagnosis not present

## 2018-01-22 DIAGNOSIS — Z888 Allergy status to other drugs, medicaments and biological substances status: Secondary | ICD-10-CM | POA: Insufficient documentation

## 2018-01-22 DIAGNOSIS — R1310 Dysphagia, unspecified: Secondary | ICD-10-CM | POA: Diagnosis present

## 2018-01-22 DIAGNOSIS — Z87891 Personal history of nicotine dependence: Secondary | ICD-10-CM | POA: Insufficient documentation

## 2018-01-22 DIAGNOSIS — R011 Cardiac murmur, unspecified: Secondary | ICD-10-CM | POA: Insufficient documentation

## 2018-01-22 DIAGNOSIS — Z981 Arthrodesis status: Secondary | ICD-10-CM | POA: Diagnosis not present

## 2018-01-22 DIAGNOSIS — Z923 Personal history of irradiation: Secondary | ICD-10-CM | POA: Diagnosis not present

## 2018-01-22 DIAGNOSIS — J449 Chronic obstructive pulmonary disease, unspecified: Secondary | ICD-10-CM | POA: Diagnosis not present

## 2018-01-22 DIAGNOSIS — E876 Hypokalemia: Secondary | ICD-10-CM | POA: Insufficient documentation

## 2018-01-22 DIAGNOSIS — Z79899 Other long term (current) drug therapy: Secondary | ICD-10-CM | POA: Diagnosis not present

## 2018-01-22 DIAGNOSIS — Z9842 Cataract extraction status, left eye: Secondary | ICD-10-CM | POA: Insufficient documentation

## 2018-01-22 DIAGNOSIS — Z800 Family history of malignant neoplasm of digestive organs: Secondary | ICD-10-CM | POA: Insufficient documentation

## 2018-01-22 DIAGNOSIS — R0600 Dyspnea, unspecified: Secondary | ICD-10-CM | POA: Insufficient documentation

## 2018-01-22 DIAGNOSIS — Z96659 Presence of unspecified artificial knee joint: Secondary | ICD-10-CM | POA: Insufficient documentation

## 2018-01-22 DIAGNOSIS — N3281 Overactive bladder: Secondary | ICD-10-CM | POA: Insufficient documentation

## 2018-01-22 DIAGNOSIS — Z8052 Family history of malignant neoplasm of bladder: Secondary | ICD-10-CM | POA: Insufficient documentation

## 2018-01-22 DIAGNOSIS — Z85820 Personal history of malignant melanoma of skin: Secondary | ICD-10-CM | POA: Insufficient documentation

## 2018-01-22 DIAGNOSIS — Z9841 Cataract extraction status, right eye: Secondary | ICD-10-CM | POA: Diagnosis not present

## 2018-01-22 DIAGNOSIS — I2510 Atherosclerotic heart disease of native coronary artery without angina pectoris: Secondary | ICD-10-CM | POA: Insufficient documentation

## 2018-01-22 DIAGNOSIS — Z807 Family history of other malignant neoplasms of lymphoid, hematopoietic and related tissues: Secondary | ICD-10-CM | POA: Insufficient documentation

## 2018-01-22 DIAGNOSIS — Z803 Family history of malignant neoplasm of breast: Secondary | ICD-10-CM | POA: Insufficient documentation

## 2018-01-22 DIAGNOSIS — Z87442 Personal history of urinary calculi: Secondary | ICD-10-CM | POA: Diagnosis not present

## 2018-01-22 DIAGNOSIS — I739 Peripheral vascular disease, unspecified: Secondary | ICD-10-CM | POA: Insufficient documentation

## 2018-01-22 DIAGNOSIS — Z9221 Personal history of antineoplastic chemotherapy: Secondary | ICD-10-CM | POA: Insufficient documentation

## 2018-01-22 DIAGNOSIS — Z7951 Long term (current) use of inhaled steroids: Secondary | ICD-10-CM | POA: Insufficient documentation

## 2018-01-22 DIAGNOSIS — K222 Esophageal obstruction: Secondary | ICD-10-CM | POA: Insufficient documentation

## 2018-01-22 DIAGNOSIS — Z8501 Personal history of malignant neoplasm of esophagus: Secondary | ICD-10-CM | POA: Diagnosis not present

## 2018-01-22 DIAGNOSIS — Z801 Family history of malignant neoplasm of trachea, bronchus and lung: Secondary | ICD-10-CM | POA: Insufficient documentation

## 2018-01-22 DIAGNOSIS — Z8049 Family history of malignant neoplasm of other genital organs: Secondary | ICD-10-CM | POA: Insufficient documentation

## 2018-01-22 DIAGNOSIS — Z8042 Family history of malignant neoplasm of prostate: Secondary | ICD-10-CM | POA: Insufficient documentation

## 2018-01-22 HISTORY — PX: ESOPHAGOGASTRODUODENOSCOPY (EGD) WITH PROPOFOL: SHX5813

## 2018-01-22 SURGERY — ESOPHAGOGASTRODUODENOSCOPY (EGD) WITH PROPOFOL
Anesthesia: General

## 2018-01-22 MED ORDER — GLYCOPYRROLATE 0.2 MG/ML IJ SOLN
INTRAMUSCULAR | Status: DC | PRN
  Administered 2018-01-22: 0.2 mg via INTRAVENOUS

## 2018-01-22 MED ORDER — PROPOFOL 10 MG/ML IV BOLUS
INTRAVENOUS | Status: DC | PRN
  Administered 2018-01-22: 20 mg via INTRAVENOUS
  Administered 2018-01-22: 80 mg via INTRAVENOUS
  Administered 2018-01-22 (×2): 20 mg via INTRAVENOUS

## 2018-01-22 MED ORDER — TRIAMCINOLONE ACETONIDE 40 MG/ML IJ SUSP
INTRAMUSCULAR | Status: DC | PRN
  Administered 2018-01-22: 40 mg via INTRAMUSCULAR

## 2018-01-22 MED ORDER — TRIAMCINOLONE ACETONIDE 40 MG/ML IJ SUSP
40.0000 mg | INTRAMUSCULAR | Status: AC
  Administered 2018-01-22: 40 mg via INTRAMUSCULAR
  Filled 2018-01-22: qty 1

## 2018-01-22 MED ORDER — SODIUM CHLORIDE 0.9 % IV SOLN
INTRAVENOUS | Status: DC
  Administered 2018-01-22: 12:00:00 via INTRAVENOUS

## 2018-01-22 MED ORDER — PROPOFOL 10 MG/ML IV BOLUS
INTRAVENOUS | Status: AC
  Filled 2018-01-22: qty 20

## 2018-01-22 MED ORDER — SODIUM CHLORIDE 0.9 % IJ SOLN
INTRAMUSCULAR | Status: DC | PRN
  Administered 2018-01-22: 3 mL via INTRAVENOUS

## 2018-01-22 MED ORDER — PROPOFOL 500 MG/50ML IV EMUL
INTRAVENOUS | Status: DC | PRN
  Administered 2018-01-22: 150 ug/kg/min via INTRAVENOUS

## 2018-01-22 NOTE — Anesthesia Preprocedure Evaluation (Signed)
Anesthesia Evaluation  Patient identified by MRN, date of birth, ID band Patient awake    Reviewed: Allergy & Precautions, NPO status , Patient's Chart, lab work & pertinent test results, reviewed documented beta blocker date and time   History of Anesthesia Complications Negative for: history of anesthetic complications  Airway Mallampati: II       Dental  (+) Dental Advidsory Given   Pulmonary neg shortness of breath, asthma , sleep apnea and Continuous Positive Airway Pressure Ventilation , COPD,  COPD inhaler, neg recent URI, former smoker,           Cardiovascular hypertension, Pt. on medications and Pt. on home beta blockers + CAD and + Peripheral Vascular Disease  (-) Past MI and (-) CHF (-) dysrhythmias + Valvular Problems/Murmurs (murmur, no tx)      Neuro/Psych neg Seizures    GI/Hepatic Neg liver ROS, GERD  Medicated,  Endo/Other  neg diabetes  Renal/GU negative Renal ROS     Musculoskeletal   Abdominal   Peds  Hematology   Anesthesia Other Findings Past Medical History: No date: Arthritis No date: Asthma No date: COPD (chronic obstructive pulmonary disease) (HCC) No date: Dysphonia No date: Dyspnea No date: Esophageal cancer (HCC) No date: GERD (gastroesophageal reflux disease) No date: Heart murmur No date: History of kidney stones No date: Hypertension 10/07/2017: Hypokalemia No date: Melanoma ([place]) No date: OAB (overactive bladder) No date: OSA on CPAP No date: Paresthesia     Comment:  from neck down after spinal abscess No date: Personal history of chemotherapy     Comment:  esophageal cancer No date: Personal history of radiation therapy     Comment:  esophageal cancer No date: Pupil asymmetry     Comment:  R pupil defect No date: Skin cancer No date: Sleep apnea 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   Reproductive/Obstetrics                             Anesthesia  Physical  Anesthesia Plan  ASA: III  Anesthesia Plan: General   Post-op Pain Management:    Induction: Intravenous  PONV Risk Score and Plan: 3 and Propofol infusion, TIVA and Treatment may vary due to age or medical condition  Airway Management Planned: Nasal Cannula and Natural Airway  Additional Equipment:   Intra-op Plan:   Post-operative Plan:   Informed Consent: I have reviewed the patients History and Physical, chart, labs and discussed the procedure including the risks, benefits and alternatives for the proposed anesthesia with the patient or authorized representative who has indicated his/her understanding and acceptance.     Plan Discussed with:   Anesthesia Plan Comments:         Anesthesia Quick Evaluation

## 2018-01-22 NOTE — Anesthesia Procedure Notes (Signed)
Date/Time: 01/22/2018 12:44 PM Performed by: Doreen Salvage, CRNA Pre-anesthesia Checklist: Patient identified, Emergency Drugs available, Suction available and Patient being monitored Patient Re-evaluated:Patient Re-evaluated prior to induction Oxygen Delivery Method: Nasal cannula Induction Type: IV induction Dental Injury: Teeth and Oropharynx as per pre-operative assessment  Comments: Nasal cannula with etCO2 monitoring

## 2018-01-22 NOTE — Anesthesia Postprocedure Evaluation (Signed)
Anesthesia Post Note  Patient: Lelani T Sundstrom  Procedure(s) Performed: ESOPHAGOGASTRODUODENOSCOPY (EGD) WITH PROPOFOL (N/A )  Patient location during evaluation: Endoscopy Anesthesia Type: General Level of consciousness: awake and alert Pain management: pain level controlled Vital Signs Assessment: post-procedure vital signs reviewed and stable Respiratory status: spontaneous breathing, nonlabored ventilation, respiratory function stable and patient connected to nasal cannula oxygen Cardiovascular status: blood pressure returned to baseline and stable Postop Assessment: no apparent nausea or vomiting Anesthetic complications: no     Last Vitals:  Vitals:   01/22/18 1326 01/22/18 1336  BP: 123/61 (!) 149/59  Pulse: 68 69  Resp: 16 (!) 22  Temp:    SpO2: 100% 98%    Last Pain:  Vitals:   01/22/18 1336  TempSrc:   PainSc: 0-No pain                 Martha Clan

## 2018-01-22 NOTE — H&P (Signed)
[name], MD [address] [place], [place], [place], [address] [address], [place], [place], [place], [address] Phone: [phone]  Fax: [phone]  Primary Care Physician:  Tonia Ghent, MD   Pre-Procedure History & Physical: HPI:  [name] is a 83 y.o. female is here for an EGD.   Past Medical History:  Diagnosis Date  . Arthritis   . Asthma   . COPD (chronic obstructive pulmonary disease) ([place])   . Dysphonia   . Dyspnea   . Esophageal cancer ([place])   . GERD (gastroesophageal reflux disease)   . Heart murmur   . History of kidney stones   . Hypertension   . Hypokalemia 10/07/2017  . Melanoma ([place])   . OAB (overactive bladder)   . OSA on CPAP   . 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 Personal history of chemotherapy    esophageal cancer  . Personal history of radiation therapy    esophageal cancer  . Pupil asymmetry    R pupil defect  . Skin cancer   . Sleep apnea   . Spinal cord abscess     Past Surgical History:  Procedure Laterality Date  . ABDOMINAL HYSTERECTOMY    . ANTERIOR CERVICAL DECOMP/DISCECTOMY FUSION N/A 07/16/2013   Procedure: ANTERIOR CERVICAL DECOMPRESSION/DISCECTOMY FUSION mutlipleLEVELS C4-7;  Surgeon: Erline Levine, MD;  Location: [facility];  Service: Neurosurgery;  Laterality: N/A;  . APPENDECTOMY    . carpel tunn Bilateral   . CATARACT EXTRACTION    . CATARACT EXTRACTION, BILATERAL    . CHOLECYSTECTOMY    . dental implant    . ESOPHAGOGASTRODUODENOSCOPY Left 03/21/2017   Procedure: ESOPHAGOGASTRODUODENOSCOPY (EGD);  Surgeon: Virgel Manifold, MD;  Location: [facility] ENDOSCOPY;  Service: Endoscopy;  Laterality: Left;  . ESOPHAGOGASTRODUODENOSCOPY (EGD) WITH PROPOFOL N/A 08/20/2017   Procedure: ESOPHAGOGASTRODUODENOSCOPY (EGD) WITH PROPOFOL;  Surgeon: Virgel Manifold, MD;  Location: [facility] ENDOSCOPY;  Service: Endoscopy;  Laterality: N/A;  . ESOPHAGOGASTRODUODENOSCOPY (EGD) WITH PROPOFOL N/A  09/19/2017   Procedure: ESOPHAGOGASTRODUODENOSCOPY (EGD) WITH PROPOFOL;  Surgeon: Virgel Manifold, MD;  Location: [facility] ENDOSCOPY;  Service: Endoscopy;  Laterality: N/A;  . ESOPHAGOGASTRODUODENOSCOPY (EGD) WITH PROPOFOL N/A 10/03/2017   Procedure: ESOPHAGOGASTRODUODENOSCOPY (EGD) WITH PROPOFOL;  Surgeon: Virgel Manifold, MD;  Location: [facility] ENDOSCOPY;  Service: Endoscopy;  Laterality: N/A;  . ESOPHAGOGASTRODUODENOSCOPY (EGD) WITH PROPOFOL N/A 10/22/2017   Procedure: ESOPHAGOGASTRODUODENOSCOPY (EGD) WITH PROPOFOL;  Surgeon: Virgel Manifold, MD;  Location: [facility] ENDOSCOPY;  Service: Endoscopy;  Laterality: N/A;  . ESOPHAGOGASTRODUODENOSCOPY (EGD) WITH PROPOFOL N/A 11/18/2017   Procedure: ESOPHAGOGASTRODUODENOSCOPY (EGD) WITH PROPOFOL;  Surgeon: Virgel Manifold, MD;  Location: [facility] ENDOSCOPY;  Service: Endoscopy;  Laterality: N/A;  . ESOPHAGOGASTRODUODENOSCOPY (EGD) WITH PROPOFOL N/A 12/17/2017   Procedure: ESOPHAGOGASTRODUODENOSCOPY (EGD) WITH PROPOFOL;  Surgeon: Virgel Manifold, MD;  Location: [facility] ENDOSCOPY;  Service: Endoscopy;  Laterality: N/A;  . ESOPHAGOGASTRODUODENOSCOPY (EGD) WITH PROPOFOL N/A 01/08/2018   Procedure: ESOPHAGOGASTRODUODENOSCOPY (EGD) WITH PROPOFOL;  Surgeon: Virgel Manifold, MD;  Location: [facility] ENDOSCOPY;  Service: Endoscopy;  Laterality: N/A;  . EYE SURGERY    . GASTRIC BYPASS    . HERNIA REPAIR    . HIATAL HERNIA REPAIR    . JOINT REPLACEMENT    . MELANOMA EXCISION    . PORTA CATH INSERTION N/A 04/07/2017   Procedure: PORTA CATH INSERTION;  Surgeon: Algernon Huxley, MD;  Location: [facility];  Service: Cardiovascular;  Laterality: N/A;  . POSTERIOR CERVICAL FUSION/FORAMINOTOMY  N/A 07/28/2013   Procedure: Cervical four-seven  posterior cervical fusion;  Surgeon: Erline Levine, MD;  Location: [facility];  Service: Neurosurgery;  Laterality: N/A;  . TONSILLECTOMY    . TOTAL KNEE ARTHROPLASTY      Prior to Admission medications   Medication  Sig Start Date End Date Taking? Authorizing Provider  acetaminophen (TYLENOL) 325 MG tablet Take 650 mg by mouth every 6 (six) hours as needed for moderate pain or headache.    Yes [provider]  calcium carbonate (TUMS EX) 750 MG chewable tablet Chew by mouth.   Yes [provider]  CARAFATE 1 GM/10ML suspension TAKE 10MLS BY MOUTH FOUR TIMES DAILY 10/28/17  Yes Virgel Manifold, MD  Cholecalciferol (VITAMIN D) 2000 UNITS CAPS Take 2,000 Units by mouth daily.   Yes [provider]  cyanocobalamin 500 MCG tablet Take 500 mcg every other day by mouth.   Yes [provider]  fluconazole (DIFLUCAN) 200 MG tablet Take 2 tablets (400 mg total) by mouth daily for 1 day, THEN 1 tablet (200 mg total) daily for 14 days. 01/09/18 01/24/18 Yes Virgel Manifold, MD  fluticasone (FLONASE) 50 MCG/ACT nasal spray Place 1-2 sprays into both nostrils daily. 06/13/17  Yes Tonia Ghent, MD  furosemide (LASIX) 20 MG tablet Take 1 tablet (20 mg total) by mouth daily. 08/14/17  Yes Minna Merritts, MD  loratadine (CLARITIN) 10 MG tablet Take 10 mg by mouth daily.   Yes [provider]  LYRICA 150 MG capsule TAKE 1 CAPSULE BY MOUTH TWO TIMES DAILY 01/06/17  Yes Tonia Ghent, MD  metoprolol succinate (TOPROL-XL) 50 MG 24 hr tablet TAKE 1 TABLET BY MOUTH DAILY 10/28/17  Yes Tonia Ghent, MD  oxybutynin (DITROPAN) 5 MG tablet TAKE ONE TABLET BY MOUTH EVERY MORNING AND TAKE ONE-HALF TABLET AT BEDTIME 12/25/17  Yes Tonia Ghent, MD  pantoprazole (PROTONIX) 40 MG tablet Take 40 mg by mouth daily. 10/28/17  Yes [provider]  Polyethyl Glycol-Propyl Glycol (SYSTANE OP) Apply 1 drop to eye daily as needed (dry eyes).   Yes [provider]  potassium chloride (KLOR-CON) 20 MEQ packet Take 20 mEq by mouth as needed (Daily as needed with fluid pill). 08/14/17  Yes Minna Merritts, MD  pyridOXINE (VITAMIN B-6) 100 MG tablet Take 1 tablet (100 mg total)  by mouth daily. 04/28/17  Yes Earlie Server, MD  senna-docusate (SENOKOT-S) 8.6-50 MG tablet Take 1 tablet at bedtime as needed by mouth for mild constipation. 03/22/17  Yes Gouru, Illene Silver, MD  traMADol (ULTRAM) 50 MG tablet Take 1 tablet (50 mg total) by mouth every 6 (six) hours as needed. 11/28/17  Yes Earlie Server, MD  traZODone (DESYREL) 50 MG tablet TAKE 1/2 TABLET BY MOUTH AT BEDTIME IF NEEDED FOR SLEEP 04/17/17  Yes Tonia Ghent, MD  acyclovir (ZOVIRAX) 400 MG tablet Take 1 tablet (400 mg total) by mouth 2 (two) times daily. Patient not taking: Reported on 01/22/2018 12/15/17   Jacquelin Hawking, NP  ALBUTEROL SULFATE HFA IN Inhale into the lungs.  07/07/15   [provider]  bisacodyl (DULCOLAX) 10 MG suppository Place rectally.    [provider]  Diclofenac Sodium 1 % CREA Apply to left arm as needed for pain three times daily Patient not taking: Reported on 01/22/2018 04/23/17   Earlie Server, MD  hydrocortisone cream 1 % Apply 1 application topically daily as needed for itching.     [provider]  lidocaine-prilocaine (EMLA) cream Apply to affected area once Patient not taking: Reported on 01/22/2018 04/10/17   Earlie Server, MD  ondansetron (ZOFRAN) 8 MG tablet Take 1 tablet (8 mg total) by mouth 2 (two) times daily as needed for refractory nausea / vomiting. Start on day 3 after chemotherapy. Patient not taking: Reported on 01/22/2018 06/30/17   Earlie Server, MD  pantoprazole (PROTONIX) 40 MG tablet Take 1 tablet (40 mg total) by mouth 2 (two) times daily before a meal. 09/09/17 12/15/17  Earlie Server, MD  Pediatric Multiple Vitamins (FLINTSTONES MULTIVITAMIN PO) Take by mouth.    [provider]  prochlorperazine (COMPAZINE) 10 MG tablet Take 1 tablet (10 mg total) by mouth every 6 (six) hours as needed (Nausea or vomiting). Patient not taking: Reported on 01/22/2018 06/30/17   Earlie Server, MD  ranitidine (ZANTAC) 150 MG/10ML syrup Take 10 mLs (150 mg total) by mouth at bedtime. 11/12/17  12/15/17  Virgel Manifold, MD    Allergies as of 01/14/2018 - Review Complete 01/13/2018  Allergen Reaction Noted  . Cefuroxime Nausea Only 07/11/2015  . Codeine Nausea Only 01/22/2011  . Doxycycline Other (See Comments) 12/03/2013  . Gabapentin Other (See Comments) 12/19/2016  . Iohexol Itching 07/15/2013  . Other Other (See Comments) 12/03/2013  . Oxycodone Other (See Comments) 12/19/2016  . Quinolones Swelling 12/03/2013  . Synvisc [hylan g-f 20] Swelling 01/22/2011    Family History  Problem Relation Age of Onset  . Breast cancer Mother 77  . Arthritis-Osteo Mother   . Breast cancer Sister 20  . Bladder Cancer Sister   . Bladder Cancer Father   . Tongue cancer Father   . Congenital heart disease Father   . Prostate cancer Brother   . Uterine cancer Maternal Aunt   . Lung cancer Paternal Uncle   . Leukemia Maternal Grandmother   . Liver cancer Paternal Grandmother   . Colon cancer Neg Hx     Social History   Socioeconomic History  . Marital status: Widowed    Spouse name: Not on file  . Number of children: 2  . Years of education: Not on file  . Highest education level: Master's degree (e.g., MA, MS, MEng, MEd, MSW, MBA)  Occupational History  . Not on file  Social Needs  . Financial resource strain: Not hard at all  . Food insecurity:    Worry: Never true    Inability: Never true  . Transportation needs:    Medical: No    Non-medical: No  Tobacco Use  . Smoking status: Former Smoker    Packs/day: 1.00    Years: 20.00    Pack years: 20.00    Types: Cigarettes    Last attempt to quit: 1977    Years since quitting: 42.7  . Smokeless tobacco: Never Used  Substance and Sexual Activity  . Alcohol use: No  . Drug use: Never  . Sexual activity: Never  Lifestyle  . Physical activity:    Days per week: 2 days    Minutes per session: 30 min  . Stress: Not at all  Relationships  . Social connections:    Talks on phone: More than three times a week     Gets together: More than three times a week    Attends religious service: More than 4 times per year    Active member of club or organization: Yes    Attends meetings of clubs or organizations: More than 4 times per year  Relationship status: Widowed  . Intimate partner violence:    Fear of current or ex partner: No    Emotionally abused: No    Physically abused: No    Forced sexual activity: No  Other Topics Concern  . Not on file  Social History Narrative   Marital Status- Widowed    Lives by herself    Employement- Retired Pharmacist, hospital   Exercise hx- Does PT     Review of Systems: See HPI, otherwise negative ROS  Physical Exam: BP 126/72   Pulse 60   Temp (!) 97.3 F (36.3 C) (Tympanic)   Resp 20   Ht 5\' 2"  (1.575 m)   Wt 92.5 kg   SpO2 100%   BMI 37.31 kg/m  General:   Alert,  pleasant and cooperative in NAD Head:  Normocephalic and atraumatic. Neck:  Supple; no masses or thyromegaly. Lungs:  Clear throughout to auscultation, normal respiratory effort.    Heart:  +S1, +S2, Regular rate and rhythm, No edema. Abdomen:  Soft, nontender and nondistended. Normal bowel sounds, without guarding, and without rebound.   Neurologic:  Alert and  oriented x4;  grossly normal neurologically.  Impression/Plan: [name] is here for an EGD for dysphagia  Risks, benefits, limitations, and alternatives regarding the procedure have been reviewed with the patient.  Questions have been answered.  All parties agreeable.   Virgel Manifold, MD  01/22/2018, 12:46 PM

## 2018-01-22 NOTE — Op Note (Addendum)
[facility] Gastroenterology Patient Name: [name] Procedure Date: 01/22/2018 12:34 PM MRN: [id number] Account #: [id number] Date of Birth: 1934-08-24 Admit Type: Outpatient Age: 83 Room: [facility] ENDO ROOM 3 Gender: Female Note Status: Finalized Procedure:            Upper GI endoscopy Indications:          Dysphagia, Stricture of the esophagus, For therapy of                        esophageal stricture Providers:            Naveyah Iacovelli B. Bonna Gains MD, MD Referring MD:         Elveria Rising. Damita Dunnings, MD (Referring MD) Medicines:            Monitored Anesthesia Care Complications:        No immediate complications. Procedure:            Pre-Anesthesia Assessment:                       - The risks and benefits of the procedure and the                        sedation options and risks were discussed with the                        patient. All questions were answered and informed                        consent was obtained.                       - Patient identification and proposed procedure were                        verified prior to the procedure.                       - ASA Grade Assessment: III - A patient with severe                        systemic disease.                       After obtaining informed consent, the endoscope was                        passed under direct vision. Throughout the procedure,                        the patient's blood pressure, pulse, and oxygen                        saturations were monitored continuously. The Endoscope                        was introduced through the mouth, and advanced to the                        jejunum. The upper GI endoscopy was accomplished with  ease. The patient tolerated the procedure well. Findings:      One moderate stenosis was found in the distal esophagus. This stenosis       measured 1 cm (in length). The stenosis was traversed. A TTS dilator was       passed through the  scope. Dilation with a 15-16.5-18 mm balloon dilator       was performed to 18 mm. The dilation site was examined and showed       moderate improvement in luminal narrowing and good heme effect was noted       after dilation to 77mm. Area was successfully injected with 4 mL of       triamcinolone (10 mg/mL) for muscle relaxation. Total of 40mg  of Kenalog       injected. No signs of perforation or crepitus present post dilation.      The entire examined stomach and anastomosis were normal.      The examined jejunum was normal. Impression:           - Esophageal stenosis. Dilated. Injected. (Since                        patient's radiation induced esophageal stricture has                        been refractory to dilation in the past and has only                        had improvement after steroid injections, the benefits                        of steroid injection during this procedure was                        considered higher than the risks. The lumen could be                        traversed with the EGD scope but was still narrow.                        Despite steroid injections in the past, the stricture                        is still present, although improved, and patient's                        dysphagia is also improved but not resolved. She still                        has dysphagia with pills. Since luminal improvement in                        this case has only been seen when steroid injection is                        performed at the time of the dilations, and the                        stricture still exists, is tight due to it being from  her previous radiation and thus associated fibrosis, it                        was considered beneficial to repeat steroid injection                        at this time. Patient is likely a poor candidate for                        esophageal stent placement since the stricture site is                         ulcerated from the radiation and stenting at that site                        would entail risk of further ulceration and bleeding.                        Studies have shown improved results with repeated                        dilations with repeated intralesional steroid                        injections. For example Kochhar and Danna Hefty used                        intralesional steroids at each session, in their study                        published in gastrointestinal endoscopy, including for                        radiation induced strictures [id number]).                       - Normal stomach and anastomosis.                       - Normal examined jejunum.                       - No specimens collected. Recommendation:       - Mechanical soft diet.                       - Discharge patient to home.                       - Continue present medications.                       - Repeat upper endoscopy in 2 weeks.                       - Return to GI clinic as previously scheduled.                       - The findings and recommendations were discussed with                        the patient.                       -  The findings and recommendations were discussed with                        the patient's family. Procedure Code(s):    --- Professional ---                       [phone], Esophagogastroduodenoscopy, flexible, transoral;                        with transendoscopic balloon dilation of esophagus                        (less than 30 mm diameter)                       43236, 59, Esophagogastroduodenoscopy, flexible,                        transoral; with directed submucosal injection(s), any                        substance Diagnosis Code(s):    --- Professional ---                       K22.2, Esophageal obstruction                       R13.10, Dysphagia, unspecified CPT copyright 2017 American Medical Association. All rights reserved. The codes documented in this report  are preliminary and upon coder review may  be revised to meet current compliance requirements.  [name], MD Margretta Sidle B. Bonna Gains MD, MD 01/22/2018 1:16:48 PM This report has been signed electronically. Number of Addenda: 0 Note Initiated On: 01/22/2018 12:34 PM Estimated Blood Loss: Estimated blood loss: none.      [facility]

## 2018-01-22 NOTE — Transfer of Care (Signed)
Immediate Anesthesia Transfer of Care Note  Patient: [name]  Procedure(s) Performed: Procedure(s): ESOPHAGOGASTRODUODENOSCOPY (EGD) WITH PROPOFOL (N/A)  Patient Location: PACU and Endoscopy Unit  Anesthesia Type:General  Level of Consciousness: sedated  Airway & Oxygen Therapy: Patient Spontanous Breathing and Patient connected to nasal cannula oxygen  Post-op Assessment: Report given to RN and Post -op Vital signs reviewed and stable  Post vital signs: Reviewed and stable  Last Vitals:  Vitals:   01/22/18 1129 01/22/18 1316  BP: 126/72 (!) 116/55  Pulse: 60 76  Resp: 20 16  Temp: (!) 36.3 C (!) 36.3 C  SpO2: 338% 32%    Complications: No apparent anesthesia complications

## 2018-01-22 NOTE — Anesthesia Post-op Follow-up Note (Signed)
Anesthesia QCDR form completed.        

## 2018-01-23 ENCOUNTER — Ambulatory Visit
Admission: RE | Admit: 2018-01-23 | Discharge: 2018-01-23 | Disposition: A | Discharge status: Home / Self Care | Payer: Medicare Other | Source: Ambulatory Visit | Attending: Oncology | Admitting: Oncology

## 2018-01-23 DIAGNOSIS — R188 Other ascites: Secondary | ICD-10-CM | POA: Diagnosis not present

## 2018-01-23 DIAGNOSIS — C155 Malignant neoplasm of lower third of esophagus: Secondary | ICD-10-CM | POA: Diagnosis present

## 2018-01-23 DIAGNOSIS — R590 Localized enlarged lymph nodes: Secondary | ICD-10-CM | POA: Diagnosis not present

## 2018-01-23 DIAGNOSIS — K222 Esophageal obstruction: Secondary | ICD-10-CM | POA: Diagnosis not present

## 2018-01-23 DIAGNOSIS — Z5111 Encounter for antineoplastic chemotherapy: Secondary | ICD-10-CM

## 2018-01-23 LAB — GLUCOSE, CAPILLARY: GLUCOSE-CAPILLARY: 119 mg/dL — AB (ref 70–99)

## 2018-01-23 MED ORDER — FLUDEOXYGLUCOSE F - 18 (FDG) INJECTION
11.4800 | Freq: Once | INTRAVENOUS | Status: AC | PRN
  Administered 2018-01-23: 11.48 via INTRAVENOUS

## 2018-01-26 ENCOUNTER — Inpatient Hospital Stay (HOSPITAL_BASED_OUTPATIENT_CLINIC_OR_DEPARTMENT_OTHER): Payer: Medicare Other | Admitting: Oncology

## 2018-01-26 ENCOUNTER — Inpatient Hospital Stay: Payer: Medicare Other

## 2018-01-26 ENCOUNTER — Other Ambulatory Visit: Payer: Self-pay

## 2018-01-26 ENCOUNTER — Encounter: Payer: Self-pay | Admitting: Gastroenterology

## 2018-01-26 VITALS — BP 144/85 | HR 66 | Temp 98.0°F | Resp 18 | Wt 199.7 lb

## 2018-01-26 DIAGNOSIS — C155 Malignant neoplasm of lower third of esophagus: Secondary | ICD-10-CM

## 2018-01-26 DIAGNOSIS — R634 Abnormal weight loss: Secondary | ICD-10-CM

## 2018-01-26 DIAGNOSIS — K1230 Oral mucositis (ulcerative), unspecified: Secondary | ICD-10-CM

## 2018-01-26 DIAGNOSIS — K219 Gastro-esophageal reflux disease without esophagitis: Secondary | ICD-10-CM

## 2018-01-26 DIAGNOSIS — Z9884 Bariatric surgery status: Secondary | ICD-10-CM

## 2018-01-26 DIAGNOSIS — Z79899 Other long term (current) drug therapy: Secondary | ICD-10-CM

## 2018-01-26 DIAGNOSIS — Z7189 Other specified counseling: Secondary | ICD-10-CM

## 2018-01-26 DIAGNOSIS — G629 Polyneuropathy, unspecified: Secondary | ICD-10-CM

## 2018-01-26 DIAGNOSIS — R188 Other ascites: Secondary | ICD-10-CM | POA: Diagnosis not present

## 2018-01-26 DIAGNOSIS — Z5111 Encounter for antineoplastic chemotherapy: Secondary | ICD-10-CM

## 2018-01-26 DIAGNOSIS — Y842 Radiological procedure and radiotherapy as the cause of abnormal reaction of the patient, or of later complication, without mention of misadventure at the time of the procedure: Secondary | ICD-10-CM

## 2018-01-26 DIAGNOSIS — J449 Chronic obstructive pulmonary disease, unspecified: Secondary | ICD-10-CM

## 2018-01-26 DIAGNOSIS — R1032 Left lower quadrant pain: Secondary | ICD-10-CM

## 2018-01-26 DIAGNOSIS — Z85820 Personal history of malignant melanoma of skin: Secondary | ICD-10-CM

## 2018-01-26 DIAGNOSIS — C787 Secondary malignant neoplasm of liver and intrahepatic bile duct: Secondary | ICD-10-CM | POA: Diagnosis not present

## 2018-01-26 DIAGNOSIS — K121 Other forms of stomatitis: Secondary | ICD-10-CM

## 2018-01-26 DIAGNOSIS — K222 Esophageal obstruction: Secondary | ICD-10-CM

## 2018-01-26 DIAGNOSIS — Z87442 Personal history of urinary calculi: Secondary | ICD-10-CM

## 2018-01-26 DIAGNOSIS — R5383 Other fatigue: Secondary | ICD-10-CM

## 2018-01-26 DIAGNOSIS — G4733 Obstructive sleep apnea (adult) (pediatric): Secondary | ICD-10-CM

## 2018-01-26 DIAGNOSIS — R5381 Other malaise: Secondary | ICD-10-CM

## 2018-01-26 DIAGNOSIS — D509 Iron deficiency anemia, unspecified: Secondary | ICD-10-CM | POA: Diagnosis not present

## 2018-01-26 DIAGNOSIS — Z803 Family history of malignant neoplasm of breast: Secondary | ICD-10-CM

## 2018-01-26 DIAGNOSIS — I10 Essential (primary) hypertension: Secondary | ICD-10-CM

## 2018-01-26 DIAGNOSIS — Z923 Personal history of irradiation: Secondary | ICD-10-CM

## 2018-01-26 DIAGNOSIS — Z87891 Personal history of nicotine dependence: Secondary | ICD-10-CM

## 2018-01-26 LAB — COMPREHENSIVE METABOLIC PANEL
ALK PHOS: 83 U/L (ref 38–126)
ALT: 17 U/L (ref 0–44)
ANION GAP: 9 (ref 5–15)
AST: 36 U/L (ref 15–41)
Albumin: 3.7 g/dL (ref 3.5–5.0)
BILIRUBIN TOTAL: 0.6 mg/dL (ref 0.3–1.2)
BUN: 20 mg/dL (ref 8–23)
CO2: 28 mmol/L (ref 22–32)
Calcium: 9 mg/dL (ref 8.9–10.3)
Chloride: 104 mmol/L (ref 98–111)
Creatinine, Ser: 0.89 mg/dL (ref 0.44–1.00)
GFR calc Af Amer: >60 mL/min (ref >60)
GFR calc non Af Amer: 59 mL/min — ABNORMAL LOW (ref >60)
Glucose, Bld: 131 mg/dL — ABNORMAL HIGH (ref 70–99)
POTASSIUM: 4.3 mmol/L (ref 3.5–5.1)
Sodium: 141 mmol/L (ref 135–145)
TOTAL PROTEIN: 6.8 g/dL (ref 6.5–8.1)

## 2018-01-26 LAB — CBC WITH DIFFERENTIAL/PLATELET
BASOS ABS: 0 10*3/uL (ref 0–0.1)
Basophils Relative: 0 %
Eosinophils Absolute: 0.1 10*3/uL (ref 0–0.7)
Eosinophils Relative: 1 %
HEMATOCRIT: 38.4 % (ref 35.0–47.0)
Hemoglobin: 12.9 g/dL (ref 12.0–16.0)
LYMPHS PCT: 9 %
Lymphs Abs: 0.5 10*3/uL — ABNORMAL LOW (ref 1.0–3.6)
MCH: 33.5 pg (ref 26.0–34.0)
MCHC: 33.6 g/dL (ref 32.0–36.0)
MCV: 99.9 fL (ref 80.0–100.0)
Monocytes Absolute: 0.5 10*3/uL (ref 0.2–0.9)
Monocytes Relative: 8 %
NEUTROS ABS: 4.4 10*3/uL (ref 1.4–6.5)
Neutrophils Relative %: 82 %
PLATELETS: 217 10*3/uL (ref 150–440)
RBC: 3.84 MIL/uL (ref 3.80–5.20)
RDW: 15.9 % — AB (ref 11.5–14.5)
WBC: 5.4 10*3/uL (ref 3.6–11.0)

## 2018-01-26 MED ORDER — SODIUM CHLORIDE 0.9% FLUSH
10.0000 mL | INTRAVENOUS | Status: AC | PRN
  Administered 2018-01-26: 10 mL via INTRAVENOUS
  Filled 2018-01-26: qty 10

## 2018-01-26 MED ORDER — HEPARIN SOD (PORK) LOCK FLUSH 100 UNIT/ML IV SOLN
500.0000 [IU] | Freq: Once | INTRAVENOUS | Status: AC
  Administered 2018-01-26: 500 [IU] via INTRAVENOUS

## 2018-01-26 MED ORDER — DRONABINOL 5 MG PO CAPS: 5.0000 mg | ORAL_CAPSULE | Freq: Two times a day (BID) | ORAL | 0 refills | Status: DC

## 2018-01-26 NOTE — Progress Notes (Signed)
Patient here for follow up. No concerns voiced.  °

## 2018-01-26 NOTE — Progress Notes (Signed)
Hematology/Oncology Follow Up Note [facility]  Telephone:([phone] [phone]  Patient Care Team: Tonia Ghent, MD as PCP - General (Family Medicine)   Name of the patient: [name]  [id number]  February 16, 1935   REASON FOR VISIT Follow up of chemotherapy management of esophageal adenocarcinoma  HISTORY OF PRESENT ILLNESS Patient is a 83 year old female who has metastatic esophageal cancer with liver involvement.  Weight loss 20 pounds for the past year prior to diagnosis.  # EGD during admission showed partially obstructive esophageal mass at the distal part of esophagus. Biopsy was taken. Pathology showed esophageal adenocarcinoma. #US biopsy of liver lesion to proof distant metastasis.  Report family history of breast cancer, but she has been having routine mammograms.  # Lives alone. She's a former smoker.  # Molecular Testing:  #[employer] Cdx: No reportable alternations with companion diagnostic (CDx) claims.  MS stable Tumor mutation burden: Cannot be determined, CCND3 amplification: Equivocal.  Amended reports on 06/04/2017, Tumor mutational burden changed from can not be determined to "TMB -low" on the re-analyzed samples.  # HER 2 negative.   Current Treatment S/p 6 cycles of FOLFOX, PET scan showed excellent partial response from both chemotherapy and radiation, result was discussed with patient.  currently on 5-FU maintenance.  S/p palliative radiation in January 2019 # Developed esophageal stricture secondary to radiation in April and got dilation via endoscopy. EGD shoed Benign-appearing esophageal stenosis. This is a result of radiation therapy.- LA Grade C radiation esophagitis. - The previously noted esophageal mass in Nov 2018 was much decreased indicating good response to radiation/chemotherapy. - Normal stomach.- Normal examined jejunum.- No specimens collected.    INTERVAL HISTORY  Patient presents to discuss about  PET scan results and further management plan.   # Chemotherapy: she tolerates well excepting that she has developed mouth sore for the past 3 cycles. # Abdominal Pain: during the interval, she had felt' Intermittent stomach pain", dull, left lower quadrant. No aggravating or alleviating factor.  # Dysphagia, chronic problem.multiple dilatation with EGD.  # Chronic neuropathy, pre-existing. She takes Lyrica # Weight loss and lack of appetite: she has lost 5 pounds during the interval. Don't feel any appetite for food.   Review of systems Review of Systems  Constitutional: Positive for malaise/fatigue. Negative for chills, diaphoresis, fever and weight loss.  HENT: Negative for ear discharge, ear pain, hearing loss, nosebleeds and sore throat.        Mouth sore resolved.  Eyes: Negative for double vision, photophobia, pain, discharge and redness.  Respiratory: Negative for cough, hemoptysis, shortness of breath and wheezing.   Cardiovascular: Negative for chest pain, palpitations, orthopnea, claudication and leg swelling.  Gastrointestinal: Negative for abdominal pain, blood in stool, constipation, diarrhea, heartburn, melena, nausea and vomiting.       Dysphagia is better  Genitourinary: Negative for dysuria, flank pain, frequency, hematuria and urgency.  Musculoskeletal: Negative for back pain, joint pain, myalgias and neck pain.  Skin: Negative for itching and rash.       Dry skin on her hands.  Neurological: Positive for tingling and sensory change. Negative for dizziness, tremors, focal weakness, weakness and headaches.  Endo/Heme/Allergies: Negative for environmental allergies. Does not bruise/bleed easily.  Psychiatric/Behavioral: Negative for depression, hallucinations, substance abuse and suicidal ideas. The patient is not nervous/anxious.     Allergies  Allergen Reactions  . Cefuroxime Nausea Only  . Codeine Nausea Only  . Doxycycline Other (See Comments)    Lip swelling  .  Gabapentin Other (See Comments)    Swelling.   . Iohexol Itching    07-15-13 pt developed itching on fingers after contrast given. Dr. Irish Elders looked at pt and said to put in system as allergy. BB  . Other Other (See Comments)    Contrast Dye- caused fever, chills, awful feeling Bandages - itching, rash  . Oxycodone Other (See Comments)    nausea  . Quinolones Swelling    Lip swelling  . Synvisc [Hylan G-F 20] Swelling     Past Medical History:  Diagnosis Date  . Arthritis   . Asthma   . COPD (chronic obstructive pulmonary disease) ([place])   . Dysphonia   . Dyspnea   . Esophageal cancer ([place])   . GERD (gastroesophageal reflux disease)   . Heart murmur   . History of kidney stones   . Hypertension   . Hypokalemia 10/07/2017  . Melanoma ([place])   . OAB (overactive bladder)   . OSA on CPAP   . 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 Personal history of chemotherapy    esophageal cancer  . Personal history of radiation therapy    esophageal cancer  . Pupil asymmetry    R pupil defect  . Skin cancer   . Sleep apnea   . Spinal cord abscess      Past Surgical History:  Procedure Laterality Date  . ABDOMINAL HYSTERECTOMY    . ANTERIOR CERVICAL DECOMP/DISCECTOMY FUSION N/A 07/16/2013   Procedure: ANTERIOR CERVICAL DECOMPRESSION/DISCECTOMY FUSION mutlipleLEVELS C4-7;  Surgeon: Erline Levine, MD;  Location: [facility];  Service: Neurosurgery;  Laterality: N/A;  . APPENDECTOMY    . carpel tunn Bilateral   . CATARACT EXTRACTION    . CATARACT EXTRACTION, BILATERAL    . CHOLECYSTECTOMY    . dental implant    . ESOPHAGOGASTRODUODENOSCOPY Left 03/21/2017   Procedure: ESOPHAGOGASTRODUODENOSCOPY (EGD);  Surgeon: Virgel Manifold, MD;  Location: [facility] ENDOSCOPY;  Service: Endoscopy;  Laterality: Left;  . ESOPHAGOGASTRODUODENOSCOPY (EGD) WITH PROPOFOL N/A 08/20/2017   Procedure: ESOPHAGOGASTRODUODENOSCOPY (EGD) WITH PROPOFOL;  Surgeon: Virgel Manifold, MD;  Location:  [facility] ENDOSCOPY;  Service: Endoscopy;  Laterality: N/A;  . ESOPHAGOGASTRODUODENOSCOPY (EGD) WITH PROPOFOL N/A 09/19/2017   Procedure: ESOPHAGOGASTRODUODENOSCOPY (EGD) WITH PROPOFOL;  Surgeon: Virgel Manifold, MD;  Location: [facility] ENDOSCOPY;  Service: Endoscopy;  Laterality: N/A;  . ESOPHAGOGASTRODUODENOSCOPY (EGD) WITH PROPOFOL N/A 10/03/2017   Procedure: ESOPHAGOGASTRODUODENOSCOPY (EGD) WITH PROPOFOL;  Surgeon: Virgel Manifold, MD;  Location: [facility] ENDOSCOPY;  Service: Endoscopy;  Laterality: N/A;  . ESOPHAGOGASTRODUODENOSCOPY (EGD) WITH PROPOFOL N/A 10/22/2017   Procedure: ESOPHAGOGASTRODUODENOSCOPY (EGD) WITH PROPOFOL;  Surgeon: Virgel Manifold, MD;  Location: [facility] ENDOSCOPY;  Service: Endoscopy;  Laterality: N/A;  . ESOPHAGOGASTRODUODENOSCOPY (EGD) WITH PROPOFOL N/A 11/18/2017   Procedure: ESOPHAGOGASTRODUODENOSCOPY (EGD) WITH PROPOFOL;  Surgeon: Virgel Manifold, MD;  Location: [facility] ENDOSCOPY;  Service: Endoscopy;  Laterality: N/A;  . ESOPHAGOGASTRODUODENOSCOPY (EGD) WITH PROPOFOL N/A 12/17/2017   Procedure: ESOPHAGOGASTRODUODENOSCOPY (EGD) WITH PROPOFOL;  Surgeon: Virgel Manifold, MD;  Location: [facility] ENDOSCOPY;  Service: Endoscopy;  Laterality: N/A;  . ESOPHAGOGASTRODUODENOSCOPY (EGD) WITH PROPOFOL N/A 01/08/2018   Procedure: ESOPHAGOGASTRODUODENOSCOPY (EGD) WITH PROPOFOL;  Surgeon: Virgel Manifold, MD;  Location: [facility] ENDOSCOPY;  Service: Endoscopy;  Laterality: N/A;  . ESOPHAGOGASTRODUODENOSCOPY (EGD) WITH PROPOFOL N/A 01/22/2018   Procedure: ESOPHAGOGASTRODUODENOSCOPY (EGD) WITH PROPOFOL;  Surgeon: Virgel Manifold, MD;  Location: [facility] ENDOSCOPY;  Service: Endoscopy;  Laterality: N/A;  . EYE SURGERY    . GASTRIC  BYPASS    . HERNIA REPAIR    . HIATAL HERNIA REPAIR    . JOINT REPLACEMENT    . MELANOMA EXCISION    . PORTA CATH INSERTION N/A 04/07/2017   Procedure: PORTA CATH INSERTION;  Surgeon: Algernon Huxley, MD;  Location: [facility];  Service:  Cardiovascular;  Laterality: N/A;  . POSTERIOR CERVICAL FUSION/FORAMINOTOMY N/A 07/28/2013   Procedure: Cervical four-seven  posterior cervical fusion;  Surgeon: Erline Levine, MD;  Location: [place] [facility];  Service: Neurosurgery;  Laterality: N/A;  . TONSILLECTOMY    . TOTAL KNEE ARTHROPLASTY      Social History   Socioeconomic History  . Marital status: Widowed    Spouse name: Not on file  . Number of children: 2  . Years of education: Not on file  . Highest education level: Master's degree (e.g., MA, MS, MEng, MEd, MSW, MBA)  Occupational History  . Not on file  Social Needs  . Financial resource strain: Not hard at all  . Food insecurity:    Worry: Never true    Inability: Never true  . Transportation needs:    Medical: No    Non-medical: No  Tobacco Use  . Smoking status: Former Smoker    Packs/day: 1.00    Years: 20.00    Pack years: 20.00    Types: Cigarettes    Last attempt to quit: 1977    Years since quitting: 42.7  . Smokeless tobacco: Never Used  Substance and Sexual Activity  . Alcohol use: No  . Drug use: Never  . Sexual activity: Never  Lifestyle  . Physical activity:    Days per week: 2 days    Minutes per session: 30 min  . Stress: Not at all  Relationships  . Social connections:    Talks on phone: More than three times a week    Gets together: More than three times a week    Attends religious service: More than 4 times per year    Active member of club or organization: Yes    Attends meetings of clubs or organizations: More than 4 times per year    Relationship status: Widowed  . Intimate partner violence:    Fear of current or ex partner: No    Emotionally abused: No    Physically abused: No    Forced sexual activity: No  Other Topics Concern  . Not on file  Social History Narrative   Marital Status- Widowed    Lives by herself    Employement- Retired Pharmacist, hospital   Exercise hx- Does PT     Family History  Problem Relation Age of Onset  .  Breast cancer Mother 74  . Arthritis-Osteo Mother   . Breast cancer Sister 44  . Bladder Cancer Sister   . Bladder Cancer Father   . Tongue cancer Father   . Congenital heart disease Father   . Prostate cancer Brother   . Uterine cancer Maternal Aunt   . Lung cancer Paternal Uncle   . Leukemia Maternal Grandmother   . Liver cancer Paternal Grandmother   . Colon cancer Neg Hx      Current Outpatient Medications:  .  acetaminophen (TYLENOL) 325 MG tablet, Take 650 mg by mouth every 6 (six) hours as needed for moderate pain or headache. , Disp: , Rfl:  .  ALBUTEROL SULFATE HFA IN, Inhale into the lungs. , Disp: , Rfl:  .  calcium carbonate (TUMS EX) 750 MG chewable  tablet, Chew by mouth., Disp: , Rfl:  .  CARAFATE 1 GM/10ML suspension, TAKE 10MLS BY MOUTH FOUR TIMES DAILY, Disp: 420 mL, Rfl: 1 .  Cholecalciferol (VITAMIN D) 2000 UNITS CAPS, Take 2,000 Units by mouth daily., Disp: , Rfl:  .  cyanocobalamin 500 MCG tablet, Take 500 mcg every other day by mouth., Disp: , Rfl:  .  fluticasone (FLONASE) 50 MCG/ACT nasal spray, Place 1-2 sprays into both nostrils daily., Disp: 16 g, Rfl: 5 .  furosemide (LASIX) 20 MG tablet, Take 1 tablet (20 mg total) by mouth daily., Disp: 90 tablet, Rfl: 3 .  loratadine (CLARITIN) 10 MG tablet, Take 10 mg by mouth daily., Disp: , Rfl:  .  LYRICA 150 MG capsule, TAKE 1 CAPSULE BY MOUTH TWO TIMES DAILY, Disp: 180 capsule, Rfl: 1 .  metoprolol succinate (TOPROL-XL) 50 MG 24 hr tablet, TAKE 1 TABLET BY MOUTH DAILY, Disp: 90 tablet, Rfl: 1 .  oxybutynin (DITROPAN) 5 MG tablet, TAKE ONE TABLET BY MOUTH EVERY MORNING AND TAKE ONE-HALF TABLET AT BEDTIME, Disp: 135 tablet, Rfl: 0 .  pantoprazole (PROTONIX) 40 MG tablet, Take 40 mg by mouth daily., Disp: , Rfl:  .  Pediatric Multiple Vitamins (FLINTSTONES MULTIVITAMIN PO), Take by mouth., Disp: , Rfl:  .  Polyethyl Glycol-Propyl Glycol (SYSTANE OP), Apply 1 drop to eye daily as needed (dry eyes)., Disp: , Rfl:  .   potassium chloride (KLOR-CON) 20 MEQ packet, Take 20 mEq by mouth as needed (Daily as needed with fluid pill)., Disp: 30 packet, Rfl: 1 .  pyridOXINE (VITAMIN B-6) 100 MG tablet, Take 1 tablet (100 mg total) by mouth daily., Disp: 30 tablet, Rfl: 3 .  senna-docusate (SENOKOT-S) 8.6-50 MG tablet, Take 1 tablet at bedtime as needed by mouth for mild constipation., Disp: , Rfl:  .  traMADol (ULTRAM) 50 MG tablet, Take 1 tablet (50 mg total) by mouth every 6 (six) hours as needed., Disp: 30 tablet, Rfl: 0 .  traZODone (DESYREL) 50 MG tablet, TAKE 1/2 TABLET BY MOUTH AT BEDTIME IF NEEDED FOR SLEEP, Disp: 45 tablet, Rfl: 3 .  acyclovir (ZOVIRAX) 400 MG tablet, Take 1 tablet (400 mg total) by mouth 2 (two) times daily. (Patient not taking: Reported on 01/22/2018), Disp: 30 tablet, Rfl: 0 .  bisacodyl (DULCOLAX) 10 MG suppository, Place rectally., Disp: , Rfl:  .  Diclofenac Sodium 1 % CREA, Apply to left arm as needed for pain three times daily (Patient not taking: Reported on 01/22/2018), Disp: 120 g, Rfl: 0 .  dronabinol (MARINOL) 5 MG capsule, Take 1 capsule (5 mg total) by mouth 2 (two) times daily before a meal., Disp: 60 capsule, Rfl: 0 .  hydrocortisone cream 1 %, Apply 1 application topically daily as needed for itching. , Disp: , Rfl:  .  pantoprazole (PROTONIX) 40 MG tablet, Take 1 tablet (40 mg total) by mouth 2 (two) times daily before a meal., Disp: 60 tablet, Rfl: 1 .  ranitidine (ZANTAC) 150 MG/10ML syrup, Take 10 mLs (150 mg total) by mouth at bedtime., Disp: 300 mL, Rfl: 3 No current facility-administered medications for this visit.   Facility-Administered Medications Ordered in Other Visits:  .  sodium chloride flush (NS) 0.9 % injection 10 mL, 10 mL, Intravenous, PRN, Earlie Server, MD, 10 mL at 01/26/18 0904  Physical exam:  Vitals:   01/26/18 0917  BP: (!) 144/85  Pulse: 66  Resp: 18  Temp: 98 F (36.7 C)  TempSrc: Tympanic  Weight: 199 lb 11.2 oz (  90.6 kg)  ECOG 1 Physical Exam    Constitutional: She is oriented to person, place, and time and well-developed, well-nourished, and in no distress. No distress.  HENT:  Head: Normocephalic and atraumatic.  Nose: Nose normal.  Mouth/Throat: Oropharynx is clear and moist. No oropharyngeal exudate.  Eyes: Pupils are equal, round, and reactive to light. Conjunctivae and EOM are normal. Left eye exhibits no discharge. No scleral icterus.  Neck: Normal range of motion. Neck supple. No JVD present.  Cardiovascular: Normal rate and regular rhythm.  Murmur heard. Pulmonary/Chest: Effort normal and breath sounds normal. No respiratory distress. She has no wheezes. She has no rales. She exhibits no tenderness.  Abdominal: Soft. Bowel sounds are normal. She exhibits no distension and no mass. There is no tenderness. There is no rebound and no guarding.  Musculoskeletal: Normal range of motion. She exhibits no edema, tenderness or deformity.  Lymphadenopathy:    She has no cervical adenopathy.  Neurological: She is alert and oriented to person, place, and time. No cranial nerve deficit. She exhibits normal muscle tone. Coordination normal.  Skin: Skin is warm and dry. She is not diaphoretic. No erythema.  Dry skin bilateral hands.  no skin peeling.  Psychiatric: Affect and judgment normal.    CMP Latest Ref Rng & Units 01/26/2018  Glucose 70 - 99 mg/dL 131(H)  BUN 8 - 23 mg/dL 20  Creatinine 0.44 - 1.00 mg/dL 0.89  Sodium 135 - 145 mmol/L 141  Potassium 3.5 - 5.1 mmol/L 4.3  Chloride 98 - 111 mmol/L 104  CO2 22 - 32 mmol/L 28  Calcium 8.9 - 10.3 mg/dL 9.0  Total Protein 6.5 - 8.1 g/dL 6.8  Total Bilirubin 0.3 - 1.2 mg/dL 0.6  Alkaline Phos 38 - 126 U/L 83  AST 15 - 41 U/L 36  ALT 0 - 44 U/L 17   CBC Latest Ref Rng & Units 01/26/2018  WBC 3.6 - 11.0 K/uL 5.4  Hemoglobin 12.0 - 16.0 g/dL 12.9  Hematocrit 35.0 - 47.0 % 38.4  Platelets 150 - 440 K/uL 217    RADIOGRAPHIC STUDIES: I have personally reviewed the radiological  images as listed and agreed with the findings in the report. PET scan 01/23/2018  Interval development of loculated ascites within the upper abdomen which overlies the liver and spleen and exhibits increased radiotracer uptake. Findings worrisome for peritoneal carcinomatosis. Consider further evaluation with diagnostic paracentesis. 2. Soft tissue infiltration within the omentum with increased radiotracer uptake noted. Also worrisome for peritoneal carcinomatosis. 3. New hypermetabolic internal mammary lymph nodes and right CP angle lymph node. Suspicious for metastatic adenopathy. 4. Mild FDG uptake is identified localizing to the lower third of the esophagus within SUV max of 5.15.  Assessment and plan  Cancer Staging Malignant neoplasm of lower third of esophagus [facility]) Staging form: Esophagus - Adenocarcinoma, AJCC 8th Edition - Clinical stage from 04/10/2017: Stage IVB (cTX, cNX, pM1) - Signed by Earlie Server, MD on 04/10/2017  1. Malignant neoplasm of lower third of esophagus (HCC)   2. Encounter for antineoplastic chemotherapy   3. Esophageal stricture   4. Neuropathy   5. Stomatitis and mucositis   6. Goals of care, counseling/discussion    #Esophageal cancer, on first line maitainence 5-FU treatment. Now with disease progression.  PET scan was independently reviewed and discussed with patient and her sister.  Unfortunately PET scan is consistent with disease progression.  Recommend start second line treatment. Plan Ramucirumab and Paclitaxel.  Rationale and side effects were discussed  with patient in details.   The care plan were discussed with patient in detail.  NCCN guidelines were reviewed and shared with patient.  The goal of treatment which is to palliate disease, disease related symptoms, improve quality of life and hopefully prolong life was highlighted in our discussion.  Chemotherapy education was provided.  We had discussed the composition of chemotherapy regimen, length of  chemo cycle, duration of treatment and the time to assess response to treatment.  Supportive care measures are necessary for patient well-being and will be provided as necessary.  Labs are reviewed and discussed with patient. Acceptable to proceed with cycle 1 Ramucitumab and Paclitaxel treatment.    # Esophageal Stricture, radiation induced. Follow up with GI.  # Chronic neuropathy: continue Lyrica. Patient understands that second line chemotherapy may exacerbate her pre-excising neuropathy.  # Weight loss/ lack of appetite: Start marinol 16m BID. Rx sent to pharmacy.  # Mucositis, continue mouth wash.  Follow up in 1 week for assessment of tolerability and Day 8 chemotherapy.  We spent sufficient time to discuss many aspect of care, questions were answered to patient's satisfaction. Total face to face encounter time for this patient visit was 45 min. >50% of the time was  spent in counseling and coordination of care.   ZEarlie Server MD, PhD Hematology Oncology CNorth East Alliance Surgery Centerat AMonadnock Community HospitalPager- [phone]/16/19

## 2018-01-26 NOTE — Progress Notes (Signed)
DISCONTINUE ON PATHWAY REGIMEN - Gastroesophageal     A cycle is every 14 days:     Oxaliplatin      Leucovorin      5-Fluorouracil      5-Fluorouracil   **Always confirm dose/schedule in your pharmacy ordering system**  REASON: Disease Progression PRIOR TREATMENT: GEOS3: mFOLFOX6 q14 Days Until Progression or Unacceptable Toxicity TREATMENT RESPONSE: Progressive Disease (PD)  START ON PATHWAY REGIMEN - Gastroesophageal     A cycle is every 28 days:     Ramucirumab      Paclitaxel   **Always confirm dose/schedule in your pharmacy ordering system**  Patient Characteristics: Distant Metastases (cM1/pM1) / Locally Recurrent Disease, Adenocarcinoma - Esophageal, GE Junction, and Gastric, Second Line, MSS / pMMR or MSI Unknown Histology: Adenocarcinoma Disease Classification: Esophageal Therapeutic Status: Distant Metastases (No Additional Staging) Line of Therapy: Second Line Microsatellite/Mismatch Repair Status: MSS/pMMR Intent of Therapy: Non-Curative / Palliative Intent, Discussed with Patient

## 2018-01-27 ENCOUNTER — Other Ambulatory Visit: Payer: Medicare Other

## 2018-01-27 ENCOUNTER — Inpatient Hospital Stay: Payer: Medicare Other

## 2018-01-27 ENCOUNTER — Ambulatory Visit: Payer: Medicare Other

## 2018-01-27 ENCOUNTER — Ambulatory Visit: Payer: Medicare Other | Admitting: Oncology

## 2018-01-27 VITALS — BP 110/66 | HR 67 | Temp 99.0°F | Resp 18 | Wt 199.7 lb

## 2018-01-27 DIAGNOSIS — Z5111 Encounter for antineoplastic chemotherapy: Secondary | ICD-10-CM

## 2018-01-27 DIAGNOSIS — C155 Malignant neoplasm of lower third of esophagus: Secondary | ICD-10-CM | POA: Diagnosis not present

## 2018-01-27 LAB — CEA: CEA1: 1.8 ng/mL (ref 0.0–4.7)

## 2018-01-27 MED ORDER — SODIUM CHLORIDE 0.9 % IV SOLN
Freq: Once | INTRAVENOUS | Status: AC
  Administered 2018-01-27: 10:00:00 via INTRAVENOUS
  Filled 2018-01-27: qty 250

## 2018-01-27 MED ORDER — ACETAMINOPHEN 325 MG PO TABS
650.0000 mg | ORAL_TABLET | Freq: Once | ORAL | Status: DC
  Filled 2018-01-27: qty 2

## 2018-01-27 MED ORDER — HEPARIN SOD (PORK) LOCK FLUSH 100 UNIT/ML IV SOLN
500.0000 [IU] | Freq: Once | INTRAVENOUS | Status: AC | PRN
  Administered 2018-01-27: 500 [IU]
  Filled 2018-01-27: qty 5

## 2018-01-27 MED ORDER — SODIUM CHLORIDE 0.9 % IV SOLN: 10.0000 mg | Freq: Once | INTRAVENOUS | Status: DC

## 2018-01-27 MED ORDER — SODIUM CHLORIDE 0.9 % IV SOLN
80.0000 mg/m2 | Freq: Once | INTRAVENOUS | Status: AC
  Administered 2018-01-27: 162 mg via INTRAVENOUS
  Filled 2018-01-27: qty 27

## 2018-01-27 MED ORDER — SODIUM CHLORIDE 0.9 % IV SOLN
8.0000 mg/kg | Freq: Once | INTRAVENOUS | Status: AC
  Administered 2018-01-27: 700 mg via INTRAVENOUS
  Filled 2018-01-27: qty 50

## 2018-01-27 MED ORDER — DEXAMETHASONE SODIUM PHOSPHATE 10 MG/ML IJ SOLN
10.0000 mg | Freq: Once | INTRAMUSCULAR | Status: AC
  Administered 2018-01-27: 10 mg via INTRAVENOUS
  Filled 2018-01-27: qty 1

## 2018-01-27 MED ORDER — FAMOTIDINE IN NACL 20-0.9 MG/50ML-% IV SOLN
20.0000 mg | Freq: Once | INTRAVENOUS | Status: AC
  Administered 2018-01-27: 20 mg via INTRAVENOUS
  Filled 2018-01-27: qty 50

## 2018-01-27 MED ORDER — DIPHENHYDRAMINE HCL 50 MG/ML IJ SOLN
50.0000 mg | Freq: Once | INTRAMUSCULAR | Status: AC
  Administered 2018-01-27: 50 mg via INTRAVENOUS
  Filled 2018-01-27: qty 1

## 2018-02-03 ENCOUNTER — Encounter: Payer: Self-pay | Admitting: Oncology

## 2018-02-03 ENCOUNTER — Other Ambulatory Visit: Payer: Self-pay

## 2018-02-03 ENCOUNTER — Inpatient Hospital Stay (HOSPITAL_BASED_OUTPATIENT_CLINIC_OR_DEPARTMENT_OTHER): Payer: Medicare Other | Admitting: Oncology

## 2018-02-03 ENCOUNTER — Inpatient Hospital Stay: Payer: Medicare Other

## 2018-02-03 VITALS — BP 115/74 | HR 61 | Temp 97.6°F | Resp 16 | Wt 200.3 lb

## 2018-02-03 DIAGNOSIS — R5383 Other fatigue: Secondary | ICD-10-CM

## 2018-02-03 DIAGNOSIS — R634 Abnormal weight loss: Secondary | ICD-10-CM

## 2018-02-03 DIAGNOSIS — I10 Essential (primary) hypertension: Secondary | ICD-10-CM

## 2018-02-03 DIAGNOSIS — C155 Malignant neoplasm of lower third of esophagus: Secondary | ICD-10-CM

## 2018-02-03 DIAGNOSIS — Z9884 Bariatric surgery status: Secondary | ICD-10-CM

## 2018-02-03 DIAGNOSIS — K219 Gastro-esophageal reflux disease without esophagitis: Secondary | ICD-10-CM

## 2018-02-03 DIAGNOSIS — D509 Iron deficiency anemia, unspecified: Secondary | ICD-10-CM

## 2018-02-03 DIAGNOSIS — R5381 Other malaise: Secondary | ICD-10-CM

## 2018-02-03 DIAGNOSIS — R630 Anorexia: Secondary | ICD-10-CM

## 2018-02-03 DIAGNOSIS — C787 Secondary malignant neoplasm of liver and intrahepatic bile duct: Secondary | ICD-10-CM | POA: Diagnosis not present

## 2018-02-03 DIAGNOSIS — G629 Polyneuropathy, unspecified: Secondary | ICD-10-CM

## 2018-02-03 DIAGNOSIS — Z923 Personal history of irradiation: Secondary | ICD-10-CM

## 2018-02-03 DIAGNOSIS — J449 Chronic obstructive pulmonary disease, unspecified: Secondary | ICD-10-CM

## 2018-02-03 DIAGNOSIS — M1990 Unspecified osteoarthritis, unspecified site: Secondary | ICD-10-CM

## 2018-02-03 DIAGNOSIS — Z5111 Encounter for antineoplastic chemotherapy: Secondary | ICD-10-CM

## 2018-02-03 DIAGNOSIS — Y842 Radiological procedure and radiotherapy as the cause of abnormal reaction of the patient, or of later complication, without mention of misadventure at the time of the procedure: Secondary | ICD-10-CM

## 2018-02-03 DIAGNOSIS — Z9989 Dependence on other enabling machines and devices: Secondary | ICD-10-CM

## 2018-02-03 DIAGNOSIS — K222 Esophageal obstruction: Secondary | ICD-10-CM

## 2018-02-03 DIAGNOSIS — Z79899 Other long term (current) drug therapy: Secondary | ICD-10-CM

## 2018-02-03 DIAGNOSIS — Z87442 Personal history of urinary calculi: Secondary | ICD-10-CM

## 2018-02-03 DIAGNOSIS — Z87891 Personal history of nicotine dependence: Secondary | ICD-10-CM

## 2018-02-03 DIAGNOSIS — Z85820 Personal history of malignant melanoma of skin: Secondary | ICD-10-CM

## 2018-02-03 DIAGNOSIS — Z803 Family history of malignant neoplasm of breast: Secondary | ICD-10-CM

## 2018-02-03 DIAGNOSIS — R188 Other ascites: Secondary | ICD-10-CM

## 2018-02-03 DIAGNOSIS — G4733 Obstructive sleep apnea (adult) (pediatric): Secondary | ICD-10-CM

## 2018-02-03 LAB — CBC WITH DIFFERENTIAL/PLATELET
BASOS ABS: 0 10*3/uL (ref 0–0.1)
BASOS PCT: 1 %
Eosinophils Absolute: 0.2 10*3/uL (ref 0–0.7)
Eosinophils Relative: 5 %
HEMATOCRIT: 36.3 % (ref 35.0–47.0)
HEMOGLOBIN: 12.4 g/dL (ref 12.0–16.0)
LYMPHS PCT: 18 %
Lymphs Abs: 0.6 10*3/uL — ABNORMAL LOW (ref 1.0–3.6)
MCH: 33.9 pg (ref 26.0–34.0)
MCHC: 34.1 g/dL (ref 32.0–36.0)
MCV: 99.6 fL (ref 80.0–100.0)
MONO ABS: 0.3 10*3/uL (ref 0.2–0.9)
Monocytes Relative: 10 %
NEUTROS ABS: 2.3 10*3/uL (ref 1.4–6.5)
NEUTROS PCT: 66 %
Platelets: 239 10*3/uL (ref 150–440)
RBC: 3.65 MIL/uL — AB (ref 3.80–5.20)
RDW: 15.1 % — ABNORMAL HIGH (ref 11.5–14.5)
WBC: 3.4 10*3/uL — AB (ref 3.6–11.0)

## 2018-02-03 LAB — COMPREHENSIVE METABOLIC PANEL
ALBUMIN: 3.4 g/dL — AB (ref 3.5–5.0)
ALT: 23 U/L (ref 0–44)
ANION GAP: 8 (ref 5–15)
AST: 45 U/L — ABNORMAL HIGH (ref 15–41)
Alkaline Phosphatase: 73 U/L (ref 38–126)
BILIRUBIN TOTAL: 0.5 mg/dL (ref 0.3–1.2)
BUN: 20 mg/dL (ref 8–23)
CO2: 27 mmol/L (ref 22–32)
Calcium: 8.9 mg/dL (ref 8.9–10.3)
Chloride: 108 mmol/L (ref 98–111)
Creatinine, Ser: 0.68 mg/dL (ref 0.44–1.00)
GFR calc non Af Amer: >60 mL/min (ref >60)
GLUCOSE: 107 mg/dL — AB (ref 70–99)
Potassium: 3.5 mmol/L (ref 3.5–5.1)
Sodium: 143 mmol/L (ref 135–145)
TOTAL PROTEIN: 6.4 g/dL — AB (ref 6.5–8.1)

## 2018-02-03 MED ORDER — SODIUM CHLORIDE 0.9% FLUSH
10.0000 mL | Freq: Once | INTRAVENOUS | Status: AC
  Administered 2018-02-03: 10 mL via INTRAVENOUS
  Filled 2018-02-03: qty 10

## 2018-02-03 MED ORDER — HEPARIN SOD (PORK) LOCK FLUSH 100 UNIT/ML IV SOLN
500.0000 [IU] | Freq: Once | INTRAVENOUS | Status: AC
  Administered 2018-02-03: 500 [IU] via INTRAVENOUS
  Filled 2018-02-03: qty 5

## 2018-02-03 MED ORDER — SODIUM CHLORIDE 0.9 % IV SOLN
80.0000 mg/m2 | Freq: Once | INTRAVENOUS | Status: AC
  Administered 2018-02-03: 162 mg via INTRAVENOUS
  Filled 2018-02-03: qty 27

## 2018-02-03 MED ORDER — FAMOTIDINE IN NACL 20-0.9 MG/50ML-% IV SOLN
20.0000 mg | Freq: Once | INTRAVENOUS | Status: AC
  Administered 2018-02-03: 20 mg via INTRAVENOUS
  Filled 2018-02-03: qty 50

## 2018-02-03 MED ORDER — DIPHENHYDRAMINE HCL 50 MG/ML IJ SOLN
50.0000 mg | Freq: Once | INTRAMUSCULAR | Status: AC
  Administered 2018-02-03: 50 mg via INTRAVENOUS
  Filled 2018-02-03: qty 1

## 2018-02-03 MED ORDER — DEXAMETHASONE SODIUM PHOSPHATE 10 MG/ML IJ SOLN
10.0000 mg | Freq: Once | INTRAMUSCULAR | Status: AC
  Administered 2018-02-03: 10 mg via INTRAVENOUS
  Filled 2018-02-03: qty 1

## 2018-02-03 MED ORDER — SODIUM CHLORIDE 0.9 % IV SOLN
Freq: Once | INTRAVENOUS | Status: AC
  Administered 2018-02-03: 10:00:00 via INTRAVENOUS
  Filled 2018-02-03: qty 250

## 2018-02-03 NOTE — Progress Notes (Signed)
Patient here today for follow up and chemotherapy.  Patient c/o constipation and fatigue.

## 2018-02-03 NOTE — Progress Notes (Signed)
Hematology/Oncology Follow Up Note [facility]  [phone] [phone]  Patient Care Team: Tonia Ghent, MD as PCP - General (Family Medicine)   Name of the patient: [name]  [id number]  08-Jan-1935   REASON FOR VISIT Follow up of chemotherapy management of esophageal adenocarcinoma  HISTORY OF PRESENT ILLNESS Patient is a 83 year old female who has metastatic esophageal cancer with liver involvement.  Weight loss 20 pounds for the past year prior to diagnosis.  # EGD during admission showed partially obstructive esophageal mass at the distal part of esophagus. Biopsy was taken. Pathology showed esophageal adenocarcinoma. #US biopsy of liver lesion to proof distant metastasis.  Report family history of breast cancer, but she has been having routine mammograms.  # Lives alone. She's a former smoker.  # Molecular Testing:  #[employer] Cdx: No reportable alternations with companion diagnostic (CDx) claims.  MS stable Tumor mutation burden: Cannot be determined, CCND3 amplification: Equivocal.  Amended reports on 06/04/2017, Tumor mutational burden changed from can not be determined to "TMB -low" on the re-analyzed samples.  # HER 2 negative.   Current Treatment S/p 6 cycles of FOLFOX, PET scan showed excellent partial response from both chemotherapy and radiation, result was discussed with patient.  currently on 5-FU maintenance.  S/p palliative radiation in January 2019 # Developed esophageal stricture secondary to radiation in April and got dilation via endoscopy. EGD shoed Benign-appearing esophageal stenosis. This is a result of radiation therapy.- LA Grade C radiation esophagitis. - The previously noted esophageal mass in Nov 2018 was much decreased indicating good response to radiation/chemotherapy. - Normal stomach.- Normal examined jejunum.- No specimens collected.    INTERVAL HISTORY  Patient with above history presents  for assessment prior to second line chemotherapy for treatment of metastatic esophageal cancer.  # Chemotherapy: She has been started on second line Cyramza and Taxol treatment on 01/20/2018.  Reports tolerating well except feeling tired.  # Abdominal Pain: Resolved.   # Dysphagia, chronic problem.she has had multiple dilatation with EGD.  Follows up with GI..  # Chronic neuropathy, pre-existing.  She takes Lyrica.  Stable. # Weight loss and lack of appetite: Gained 1 pound since last visit.  Also she was started on Marinol 5 mg twice daily.  Patient reports that she does not take it every day.  Review of systems Review of Systems  Constitutional: Positive for malaise/fatigue. Negative for chills, diaphoresis, fever and weight loss.  HENT: Negative for ear discharge, ear pain, hearing loss, nosebleeds and sore throat.        Mouth sore resolved.  Eyes: Negative for double vision, photophobia, pain, discharge and redness.  Respiratory: Negative for cough, hemoptysis, shortness of breath and wheezing.   Cardiovascular: Negative for chest pain, palpitations, orthopnea, claudication and leg swelling.  Gastrointestinal: Negative for abdominal pain, blood in stool, constipation, diarrhea, heartburn, melena, nausea and vomiting.       Dysphagia is better  Genitourinary: Negative for dysuria, flank pain, frequency, hematuria and urgency.  Musculoskeletal: Negative for back pain, joint pain, myalgias and neck pain.  Skin: Negative for itching and rash.       Dry skin on her hands.  Neurological: Positive for tingling and sensory change. Negative for dizziness, tremors, focal weakness, weakness and headaches.  Endo/Heme/Allergies: Negative for environmental allergies. Does not bruise/bleed easily.  Psychiatric/Behavioral: Negative for depression, hallucinations, substance abuse and suicidal ideas. The patient is not nervous/anxious.     Allergies  Allergen Reactions  . Cefuroxime  Nausea Only  .  Codeine Nausea Only  . Doxycycline Other (See Comments)    Lip swelling  . Gabapentin Other (See Comments)    Swelling.   . Iohexol Itching    07-15-13 pt developed itching on fingers after contrast given. Dr. Irish Elders looked at pt and said to put in system as allergy. BB  . Other Other (See Comments)    Contrast Dye- caused fever, chills, awful feeling Bandages - itching, rash  . Oxycodone Other (See Comments)    nausea  . Quinolones Swelling    Lip swelling  . Synvisc [Hylan G-F 20] Swelling     Past Medical History:  Diagnosis Date  . Arthritis   . Asthma   . COPD (chronic obstructive pulmonary disease) ([place])   . Dysphonia   . Dyspnea   . Esophageal cancer ([place])   . GERD (gastroesophageal reflux disease)   . Heart murmur   . History of kidney stones   . Hypertension   . Hypokalemia 10/07/2017  . Melanoma ([place])   . OAB (overactive bladder)   . OSA on CPAP   . 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 Personal history of chemotherapy    esophageal cancer  . Personal history of radiation therapy    esophageal cancer  . Pupil asymmetry    R pupil defect  . Skin cancer   . Sleep apnea   . Spinal cord abscess      Past Surgical History:  Procedure Laterality Date  . ABDOMINAL HYSTERECTOMY    . ANTERIOR CERVICAL DECOMP/DISCECTOMY FUSION N/A 07/16/2013   Procedure: ANTERIOR CERVICAL DECOMPRESSION/DISCECTOMY FUSION mutlipleLEVELS C4-7;  Surgeon: Erline Levine, MD;  Location: [place] [facility]UR[facility]S;  Service: Neurosurgery;  Laterality: N/A;  . APPENDECTOMY    . carpel tunn Bilateral   . CATARACT EXTRACTION    . CATARACT EXTRACTION, BILATERAL    . CHOLECYSTECTOMY    . dental implant    . ESOPHAGOGASTRODUODENOSCOPY Left 03/21/2017   Procedure: ESOPHAGOGASTRODUODENOSCOPY (EGD);  Surgeon: Virgel Manifold, MD;  Location: [facility] ENDOSCOPY;  Service: Endoscopy;  Laterality: Left;  . ESOPHAGOGASTRODUODENOSCOPY (EGD) WITH PROPOFOL N/A 08/20/2017   Procedure:  ESOPHAGOGASTRODUODENOSCOPY (EGD) WITH PROPOFOL;  Surgeon: Virgel Manifold, MD;  Location: [facility] ENDOSCOPY;  Service: Endoscopy;  Laterality: N/A;  . ESOPHAGOGASTRODUODENOSCOPY (EGD) WITH PROPOFOL N/A 09/19/2017   Procedure: ESOPHAGOGASTRODUODENOSCOPY (EGD) WITH PROPOFOL;  Surgeon: Virgel Manifold, MD;  Location: [facility] ENDOSCOPY;  Service: Endoscopy;  Laterality: N/A;  . ESOPHAGOGASTRODUODENOSCOPY (EGD) WITH PROPOFOL N/A 10/03/2017   Procedure: ESOPHAGOGASTRODUODENOSCOPY (EGD) WITH PROPOFOL;  Surgeon: Virgel Manifold, MD;  Location: [facility] ENDOSCOPY;  Service: Endoscopy;  Laterality: N/A;  . ESOPHAGOGASTRODUODENOSCOPY (EGD) WITH PROPOFOL N/A 10/22/2017   Procedure: ESOPHAGOGASTRODUODENOSCOPY (EGD) WITH PROPOFOL;  Surgeon: Virgel Manifold, MD;  Location: [facility] ENDOSCOPY;  Service: Endoscopy;  Laterality: N/A;  . ESOPHAGOGASTRODUODENOSCOPY (EGD) WITH PROPOFOL N/A 11/18/2017   Procedure: ESOPHAGOGASTRODUODENOSCOPY (EGD) WITH PROPOFOL;  Surgeon: Virgel Manifold, MD;  Location: [facility] ENDOSCOPY;  Service: Endoscopy;  Laterality: N/A;  . ESOPHAGOGASTRODUODENOSCOPY (EGD) WITH PROPOFOL N/A 12/17/2017   Procedure: ESOPHAGOGASTRODUODENOSCOPY (EGD) WITH PROPOFOL;  Surgeon: Virgel Manifold, MD;  Location: [facility] ENDOSCOPY;  Service: Endoscopy;  Laterality: N/A;  . ESOPHAGOGASTRODUODENOSCOPY (EGD) WITH PROPOFOL N/A 01/08/2018   Procedure: ESOPHAGOGASTRODUODENOSCOPY (EGD) WITH PROPOFOL;  Surgeon: Virgel Manifold, MD;  Location: [facility] ENDOSCOPY;  Service: Endoscopy;  Laterality: N/A;  . ESOPHAGOGASTRODUODENOSCOPY (EGD) WITH PROPOFOL N/A 01/22/2018   Procedure: ESOPHAGOGASTRODUODENOSCOPY (EGD) WITH PROPOFOL;  Surgeon: [name]  B, MD;  Location: [facility] ENDOSCOPY;  Service: Endoscopy;  Laterality: N/A;  . EYE SURGERY    . GASTRIC BYPASS    . HERNIA REPAIR    . HIATAL HERNIA REPAIR    . JOINT REPLACEMENT    . MELANOMA EXCISION    . PORTA CATH INSERTION N/A 04/07/2017   Procedure: PORTA CATH  INSERTION;  Surgeon: Algernon Huxley, MD;  Location: [facility];  Service: Cardiovascular;  Laterality: N/A;  . POSTERIOR CERVICAL FUSION/FORAMINOTOMY N/A 07/28/2013   Procedure: Cervical four-seven  posterior cervical fusion;  Surgeon: Erline Levine, MD;  Location: [place] NEURO ORS;  Service: Neurosurgery;  Laterality: N/A;  . TONSILLECTOMY    . TOTAL KNEE ARTHROPLASTY      Social History   Socioeconomic History  . Marital status: Widowed    Spouse name: Not on file  . Number of children: 2  . Years of education: Not on file  . Highest education level: Master's degree (e.g., MA, MS, MEng, MEd, MSW, MBA)  Occupational History  . Not on file  Social Needs  . Financial resource strain: Not hard at all  . Food insecurity:    Worry: Never true    Inability: Never true  . Transportation needs:    Medical: No    Non-medical: No  Tobacco Use  . Smoking status: Former Smoker    Packs/day: 1.00    Years: 20.00    Pack years: 20.00    Types: Cigarettes    Last attempt to quit: 1977    Years since quitting: 42.7  . Smokeless tobacco: Never Used  Substance and Sexual Activity  . Alcohol use: No  . Drug use: Never  . Sexual activity: Never  Lifestyle  . Physical activity:    Days per week: 2 days    Minutes per session: 30 min  . Stress: Not at all  Relationships  . Social connections:    Talks on phone: More than three times a week    Gets together: More than three times a week    Attends religious service: More than 4 times per year    Active member of club or organization: Yes    Attends meetings of clubs or organizations: More than 4 times per year    Relationship status: Widowed  . Intimate partner violence:    Fear of current or ex partner: No    Emotionally abused: No    Physically abused: No    Forced sexual activity: No  Other Topics Concern  . Not on file  Social History Narrative   Marital Status- Widowed    Lives by herself    Employement- Retired Pharmacist, hospital     Exercise hx- Does PT     Family History  Problem Relation Age of Onset  . Breast cancer Mother 43  . Arthritis-Osteo Mother   . Breast cancer Sister 29  . Bladder Cancer Sister   . Bladder Cancer Father   . Tongue cancer Father   . Congenital heart disease Father   . Prostate cancer Brother   . Uterine cancer Maternal Aunt   . Lung cancer Paternal Uncle   . Leukemia Maternal Grandmother   . Liver cancer Paternal Grandmother   . Colon cancer Neg Hx      Current Outpatient Medications:  .  acetaminophen (TYLENOL) 325 MG tablet, Take 650 mg by mouth every 6 (six) hours as needed for moderate pain or headache. , Disp: , Rfl:  .  acyclovir (ZOVIRAX) 400 MG tablet, Take 1 tablet (400 mg total) by mouth 2 (two) times daily., Disp: 30 tablet, Rfl: 0 .  ALBUTEROL SULFATE HFA IN, Inhale into the lungs. , Disp: , Rfl:  .  bisacodyl (DULCOLAX) 10 MG suppository, Place rectally., Disp: , Rfl:  .  calcium carbonate (TUMS EX) 750 MG chewable tablet, Chew by mouth., Disp: , Rfl:  .  CARAFATE 1 GM/10ML suspension, TAKE 10MLS BY MOUTH FOUR TIMES DAILY, Disp: 420 mL, Rfl: 1 .  Cholecalciferol (VITAMIN D) 2000 UNITS CAPS, Take 2,000 Units by mouth daily., Disp: , Rfl:  .  cyanocobalamin 500 MCG tablet, Take 500 mcg every other day by mouth., Disp: , Rfl:  .  Diclofenac Sodium 1 % CREA, Apply to left arm as needed for pain three times daily, Disp: 120 g, Rfl: 0 .  dronabinol (MARINOL) 5 MG capsule, Take 1 capsule (5 mg total) by mouth 2 (two) times daily before a meal., Disp: 60 capsule, Rfl: 0 .  fluticasone (FLONASE) 50 MCG/ACT nasal spray, Place 1-2 sprays into both nostrils daily., Disp: 16 g, Rfl: 5 .  furosemide (LASIX) 20 MG tablet, Take 1 tablet (20 mg total) by mouth daily., Disp: 90 tablet, Rfl: 3 .  hydrocortisone cream 1 %, Apply 1 application topically daily as needed for itching. , Disp: , Rfl:  .  loratadine (CLARITIN) 10 MG tablet, Take 10 mg by mouth daily., Disp: , Rfl:  .  LYRICA  150 MG capsule, TAKE 1 CAPSULE BY MOUTH TWO TIMES DAILY, Disp: 180 capsule, Rfl: 1 .  metoprolol succinate (TOPROL-XL) 50 MG 24 hr tablet, TAKE 1 TABLET BY MOUTH DAILY, Disp: 90 tablet, Rfl: 1 .  oxybutynin (DITROPAN) 5 MG tablet, TAKE ONE TABLET BY MOUTH EVERY MORNING AND TAKE ONE-HALF TABLET AT BEDTIME, Disp: 135 tablet, Rfl: 0 .  pantoprazole (PROTONIX) 40 MG tablet, Take 40 mg by mouth daily., Disp: , Rfl:  .  Pediatric Multiple Vitamins (FLINTSTONES MULTIVITAMIN PO), Take by mouth., Disp: , Rfl:  .  Polyethyl Glycol-Propyl Glycol (SYSTANE OP), Apply 1 drop to eye daily as needed (dry eyes)., Disp: , Rfl:  .  potassium chloride (KLOR-CON) 20 MEQ packet, Take 20 mEq by mouth as needed (Daily as needed with fluid pill)., Disp: 30 packet, Rfl: 1 .  pyridOXINE (VITAMIN B-6) 100 MG tablet, Take 1 tablet (100 mg total) by mouth daily., Disp: 30 tablet, Rfl: 3 .  senna-docusate (SENOKOT-S) 8.6-50 MG tablet, Take 1 tablet at bedtime as needed by mouth for mild constipation., Disp: , Rfl:  .  traMADol (ULTRAM) 50 MG tablet, Take 1 tablet (50 mg total) by mouth every 6 (six) hours as needed., Disp: 30 tablet, Rfl: 0 .  traZODone (DESYREL) 50 MG tablet, TAKE 1/2 TABLET BY MOUTH AT BEDTIME IF NEEDED FOR SLEEP, Disp: 45 tablet, Rfl: 3 .  pantoprazole (PROTONIX) 40 MG tablet, Take 1 tablet (40 mg total) by mouth 2 (two) times daily before a meal., Disp: 60 tablet, Rfl: 1 .  ranitidine (ZANTAC) 150 MG/10ML syrup, Take 10 mLs (150 mg total) by mouth at bedtime., Disp: 300 mL, Rfl: 3 No current facility-administered medications for this visit.   Facility-Administered Medications Ordered in Other Visits:  .  sodium chloride flush (NS) 0.9 % injection 10 mL, 10 mL, Intravenous, PRN, Earlie Server, MD, 10 mL at 01/26/18 0904  Physical exam:  Vitals:   02/03/18 0921  BP: 115/74  Pulse: 61  Resp: 16  Temp: 97.6 F (36.4 C)  TempSrc: Tympanic  Weight: 200 lb 5 oz (90.9 kg)  ECOG 1 Physical Exam    Constitutional: She is oriented to person, place, and time. No distress.  HENT:  Head: Normocephalic and atraumatic.  Nose: Nose normal.  Mouth/Throat: Oropharynx is clear and moist. No oropharyngeal exudate.  Eyes: Pupils are equal, round, and reactive to light. Conjunctivae and EOM are normal. Left eye exhibits no discharge. No scleral icterus.  Neck: Normal range of motion. Neck supple. No JVD present.  Cardiovascular: Normal rate and regular rhythm.  Murmur heard. Pulmonary/Chest: Effort normal and breath sounds normal. No respiratory distress. She has no wheezes. She has no rales. She exhibits no tenderness.  Abdominal: Soft. Bowel sounds are normal. She exhibits no distension and no mass. There is no tenderness. There is no rebound and no guarding.  Musculoskeletal: Normal range of motion. She exhibits no edema, tenderness or deformity.  Lymphadenopathy:    She has no cervical adenopathy.  Neurological: She is alert and oriented to person, place, and time. No cranial nerve deficit. She exhibits normal muscle tone. Coordination normal.  Skin: Skin is warm and dry. She is not diaphoretic. No erythema.  Psychiatric: Affect and judgment normal.    CMP Latest Ref Rng & Units 02/03/2018  Glucose 70 - 99 mg/dL 107(H)  BUN 8 - 23 mg/dL 20  Creatinine 0.44 - 1.00 mg/dL 0.68  Sodium 135 - 145 mmol/L 143  Potassium 3.5 - 5.1 mmol/L 3.5  Chloride 98 - 111 mmol/L 108  CO2 22 - 32 mmol/L 27  Calcium 8.9 - 10.3 mg/dL 8.9  Total Protein 6.5 - 8.1 g/dL 6.4(L)  Total Bilirubin 0.3 - 1.2 mg/dL 0.5  Alkaline Phos 38 - 126 U/L 73  AST 15 - 41 U/L 45(H)  ALT 0 - 44 U/L 23   CBC Latest Ref Rng & Units 02/03/2018  WBC 3.6 - 11.0 K/uL 3.4(L)  Hemoglobin 12.0 - 16.0 g/dL 12.4  Hematocrit 35.0 - 47.0 % 36.3  Platelets 150 - 440 K/uL 239    RADIOGRAPHIC STUDIES: I have personally reviewed the radiological images as listed and agreed with the findings in the report. PET scan 01/23/2018  Interval  development of loculated ascites within the upper abdomen which overlies the liver and spleen and exhibits increased radiotracer uptake. Findings worrisome for peritoneal carcinomatosis. Consider further evaluation with diagnostic paracentesis. 2. Soft tissue infiltration within the omentum with increased radiotracer uptake noted. Also worrisome for peritoneal carcinomatosis. 3. New hypermetabolic internal mammary lymph nodes and right CP angle lymph node. Suspicious for metastatic adenopathy. 4. Mild FDG uptake is identified localizing to the lower third of the esophagus within SUV max of 5.15.  Assessment and plan  Cancer Staging Malignant neoplasm of lower third of esophagus [facility]) Staging form: Esophagus - Adenocarcinoma, AJCC 8th Edition - Clinical stage from 04/10/2017: Stage IVB (cTX, cNX, pM1) - Signed by Earlie Server, MD on 04/10/2017  1. Encounter for antineoplastic chemotherapy   2. Malignant neoplasm of lower third of esophagus (HCC)   3. Esophageal stricture   4. Neuropathy    #Esophageal cancer, progressed.  Now on second line treatment with Cyramza and paclitaxel. She tolerated day 1 treatment.  Tolerates well. Labs were reviewed and discussed with patient and her sister.  Acceptable counts to proceed with today's paclitaxel. Plan repeat PET scan after 3-4 cycles.  # Esophageal Stricture, radiation induced.  Stable follows up with GI. # Chronic neuropathy: Continue Lyrica.  Patient understands that second line chemotherapy may exacerbate  her pre-excising neuropathy.  # Weight loss/ lack of appetite: Discussed with patient and encouraged her to continue to use Marinol 55m BID #Fatigue, worsened by chemotherapy.  Continue to monitor # Mucositis, resolved. Follow up in 1 week for assessment prior to day 15 treatment.  We spent sufficient time to discuss many aspect of care, questions were answered to patient's satisfaction. Total face to face encounter time for this patient visit  was 25 min. >50% of the time was  spent in counseling and coordination of care.   ZEarlie Server MD, PhD Hematology Oncology [facility] Centerat AKindred Hospitals-DaytonPager- [id number]

## 2018-02-04 LAB — CEA: CEA1: 2.5 ng/mL (ref 0.0–4.7)

## 2018-02-05 ENCOUNTER — Ambulatory Visit (INDEPENDENT_AMBULATORY_CARE_PROVIDER_SITE_OTHER): Payer: Medicare Other | Admitting: Gastroenterology

## 2018-02-05 ENCOUNTER — Encounter: Payer: Self-pay | Admitting: Gastroenterology

## 2018-02-05 VITALS — BP 110/74 | HR 74 | Wt 198.8 lb

## 2018-02-05 DIAGNOSIS — K222 Esophageal obstruction: Secondary | ICD-10-CM

## 2018-02-05 NOTE — Progress Notes (Signed)
[name], MD [address]  [place], [place] [address]  Main: [phone]  Fax: [phone]   Primary Care Physician: Tonia Ghent, MD  Primary Gastroenterologist:  Dr. [name]  Chief Complaint  Patient presents with  . Follow-up    dysphagia    HPI: [name] is a 83 y.o. female here for follow up of dysphagia. Pt. has undergone several EGD with dilation and steroid injections with good results.  Last procedure was on January 22, 2018 and patient reported resolution of dysphagia since then.  Able to tolerate solid food diet without any dysphagia to pills, solids, Or liquids.  No melena or hematochezia.  No nausea or vomiting.  No emesis.  Has history of esophageal cancer, adenocarcinoma, stage IVb, now with progression per PET scan and on second line treatment per oncology.   Current Outpatient Medications  Medication Sig Dispense Refill  . acetaminophen (TYLENOL) 325 MG tablet Take 650 mg by mouth every 6 (six) hours as needed for moderate pain or headache.     Marland Kitchen acyclovir (ZOVIRAX) 400 MG tablet Take 1 tablet (400 mg total) by mouth 2 (two) times daily. 30 tablet 0  . ALBUTEROL SULFATE HFA IN Inhale into the lungs.     . bisacodyl (DULCOLAX) 10 MG suppository Place rectally.    . calcium carbonate (TUMS EX) 750 MG chewable tablet Chew by mouth.    Marland Kitchen CARAFATE 1 GM/10ML suspension TAKE 10MLS BY MOUTH FOUR TIMES DAILY 420 mL 1  . Cholecalciferol (VITAMIN D) 2000 UNITS CAPS Take 2,000 Units by mouth daily.    . cyanocobalamin 500 MCG tablet Take 500 mcg every other day by mouth.    . Diclofenac Sodium 1 % CREA Apply to left arm as needed for pain three times daily 120 g 0  . dronabinol (MARINOL) 5 MG capsule Take 1 capsule (5 mg total) by mouth 2 (two) times daily before a meal. 60 capsule 0  . fluticasone (FLONASE) 50 MCG/ACT nasal spray Place 1-2 sprays into both nostrils daily. 16 g 5  . furosemide (LASIX) 20 MG tablet Take  1 tablet (20 mg total) by mouth daily. 90 tablet 3  . hydrocortisone cream 1 % Apply 1 application topically daily as needed for itching.     . loratadine (CLARITIN) 10 MG tablet Take 10 mg by mouth daily.    Marland Kitchen LYRICA 150 MG capsule TAKE 1 CAPSULE BY MOUTH TWO TIMES DAILY 180 capsule 1  . metoprolol succinate (TOPROL-XL) 50 MG 24 hr tablet TAKE 1 TABLET BY MOUTH DAILY 90 tablet 1  . oxybutynin (DITROPAN) 5 MG tablet TAKE ONE TABLET BY MOUTH EVERY MORNING AND TAKE ONE-HALF TABLET AT BEDTIME 135 tablet 0  . pantoprazole (PROTONIX) 40 MG tablet Take 40 mg by mouth daily.    . Pediatric Multiple Vitamins (FLINTSTONES MULTIVITAMIN PO) Take by mouth.    Vladimir Faster Glycol-Propyl Glycol (SYSTANE OP) Apply 1 drop to eye daily as needed (dry eyes).    . potassium chloride (KLOR-CON) 20 MEQ packet Take 20 mEq by mouth as needed (Daily as needed with fluid pill). 30 packet 1  . pyridOXINE (VITAMIN B-6) 100 MG tablet Take 1 tablet (100 mg total) by mouth daily. 30 tablet 3  . senna-docusate (SENOKOT-S) 8.6-50 MG tablet Take 1 tablet at bedtime as needed by mouth for mild constipation.    . traMADol (ULTRAM) 50 MG tablet Take 1 tablet (50 mg total) by mouth every 6 (six) hours  as needed. 30 tablet 0  . traZODone (DESYREL) 50 MG tablet TAKE 1/2 TABLET BY MOUTH AT BEDTIME IF NEEDED FOR SLEEP 45 tablet 3  . pantoprazole (PROTONIX) 40 MG tablet Take 1 tablet (40 mg total) by mouth 2 (two) times daily before a meal. 60 tablet 1  . ranitidine (ZANTAC) 150 MG/10ML syrup Take 10 mLs (150 mg total) by mouth at bedtime. 300 mL 3   No current facility-administered medications for this visit.    Facility-Administered Medications Ordered in Other Visits  Medication Dose Route Frequency Provider Last Rate Last Dose  . sodium chloride flush (NS) 0.9 % injection 10 mL  10 mL Intravenous PRN Earlie Server, MD   10 mL at 01/26/18 0904    Allergies as of 02/05/2018 - Review Complete 02/05/2018  Allergen Reaction Noted  .  Cefuroxime Nausea Only 07/11/2015  . Codeine Nausea Only 01/22/2011  . Doxycycline Other (See Comments) 12/03/2013  . Gabapentin Other (See Comments) 12/19/2016  . Iohexol Itching 07/15/2013  . Other Other (See Comments) 12/03/2013  . Oxycodone Other (See Comments) 12/19/2016  . Quinolones Swelling 12/03/2013  . Synvisc [hylan g-f 20] Swelling 01/22/2011    ROS:  General: Negative for anorexia, weight loss, fever, chills, fatigue, weakness. ENT: Negative for hoarseness, difficulty swallowing , nasal congestion. CV: Negative for chest pain, angina, palpitations, dyspnea on exertion, peripheral edema.  Respiratory: Negative for dyspnea at rest, dyspnea on exertion, cough, sputum, wheezing.  GI: See history of present illness. GU:  Negative for dysuria, hematuria, urinary incontinence, urinary frequency, nocturnal urination.  Endo: Negative for unusual weight change.    Physical Examination:   BP 110/74   Pulse 74   Wt 198 lb 12.8 oz (90.2 kg)   BMI 36.36 kg/m   General: Well-nourished, well-developed in no acute distress.  Eyes: No icterus. Conjunctivae pink. Mouth: Oropharyngeal mucosa moist and pink , no lesions erythema or exudate. Neck: Supple, Trachea midline Abdomen: Bowel sounds are normal, nontender, nondistended, no hepatosplenomegaly or masses, no abdominal bruits or hernia , no rebound or guarding.   Extremities: No lower extremity edema. No clubbing or deformities. Neuro: Alert and oriented x 3.  Grossly intact. Skin: Warm and dry, no jaundice.   Psych: Alert and cooperative, normal mood and affect.   Labs: CMP     Component Value Date/Time   NA 143 02/03/2018 0847   NA 138 07/13/2013 0609   K 3.5 02/03/2018 0847   K 4.1 07/13/2013 0609   CL 108 02/03/2018 0847   CL 104 07/13/2013 0609   CO2 27 02/03/2018 0847   CO2 29 07/13/2013 0609   GLUCOSE 107 (H) 02/03/2018 0847   GLUCOSE 115 (H) 07/13/2013 0609   BUN 20 02/03/2018 0847   BUN 15 07/13/2013 0609     CREATININE 0.68 02/03/2018 0847   CREATININE 0.67 07/13/2013 0609   CALCIUM 8.9 02/03/2018 0847   CALCIUM 8.3 (L) 07/13/2013 0609   PROT 6.4 (L) 02/03/2018 0847   ALBUMIN 3.4 (L) 02/03/2018 0847   AST 45 (H) 02/03/2018 0847   ALT 23 02/03/2018 0847   ALKPHOS 73 02/03/2018 0847   BILITOT 0.5 02/03/2018 0847   GFRNONAA >60 02/03/2018 0847   GFRNONAA >60 07/13/2013 0609   GFRAA >60 02/03/2018 0847   GFRAA >60 07/13/2013 0609   Lab Results  Component Value Date   WBC 3.4 (L) 02/03/2018   HGB 12.4 02/03/2018   HCT 36.3 02/03/2018   MCV 99.6 02/03/2018   PLT 239 02/03/2018  Imaging Studies: Nm Pet Image Restag (ps) Skull Base To Thigh  Result Date: 01/23/2018 CLINICAL DATA:  Subsequent treatment strategy for esophageal cancer. EXAM: NUCLEAR MEDICINE PET SKULL BASE TO THIGH TECHNIQUE: 11.48 mCi F-18 FDG was injected intravenously. Full-ring PET imaging was performed from the skull base to thigh after the radiotracer. CT data was obtained and used for attenuation correction and anatomic localization. Fasting blood glucose: 119 mg/dl COMPARISON:  10/15/2017 FINDINGS: Mediastinal blood pool activity: SUV max 2.69 NECK: No hypermetabolic lymph nodes in the neck. Incidental CT findings: none CHEST: There is a which has an SUV max of 7.76. This is new compared with the previous exam. Mild FDG uptake within left internal mammary lymph node has an SUV max of 3.37. No hypermetabolic mediastinal or hilar lymph nodes. Mild FDG uptake within right CP angle lymph node measuring 9 mm is new from the previous exam. Mild FDG uptake localizing to the lower third of the esophagus has an SUV max of 5.15. No hypermetabolic mediastinal or hilar lymph nodes. No hypermetabolic pulmonary nodules. Incidental CT findings: Cardiac enlargement with aortic atherosclerosis noted. Calcifications in the LAD and RCA noted. ABDOMEN/PELVIS: No focal areas of increased uptake identified within the liver parenchyma. No abnormal  uptake within the pancreas or spleen. The adrenal glands appear normal. Interval development of loculated upper abdominal ascites which overlies the spleen and liver. There is corresponding increased radiotracer uptake within the fluid and along the surfaces of the liver and spleen. SUV max along the inferior surface of the right lobe of liver is equal to 5.6. Along the inferior surface of the spleen the SUV max is equal to 6.18. There is soft tissue infiltration within the omentum with corresponding increased radiotracer uptake. SUV max within the omentum is equal to 5.17. Incidental CT findings: none SKELETON: No focal hypermetabolic activity to suggest skeletal metastasis. Incidental CT findings: none IMPRESSION: 1. Interval development of loculated ascites within the upper abdomen which overlies the liver and spleen and exhibits increased radiotracer uptake. Findings worrisome for peritoneal carcinomatosis. Consider further evaluation with diagnostic paracentesis. 2. Soft tissue infiltration within the omentum with increased radiotracer uptake noted. Also worrisome for peritoneal carcinomatosis. 3. New hypermetabolic internal mammary lymph nodes and right CP angle lymph node. Suspicious for metastatic adenopathy. 4. Mild FDG uptake is identified localizing to the lower third of the esophagus within SUV max of 5.15. Electronically Signed   By: Kerby Moors M.D.   On: 01/23/2018 13:00    Assessment and Plan:   [name] is a 83 y.o. 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

## 2018-02-09 ENCOUNTER — Inpatient Hospital Stay: Payer: Medicare Other

## 2018-02-09 ENCOUNTER — Encounter: Payer: Self-pay | Admitting: Oncology

## 2018-02-09 ENCOUNTER — Other Ambulatory Visit: Payer: Self-pay

## 2018-02-09 ENCOUNTER — Inpatient Hospital Stay (HOSPITAL_BASED_OUTPATIENT_CLINIC_OR_DEPARTMENT_OTHER): Payer: Medicare Other | Admitting: Oncology

## 2018-02-09 VITALS — BP 112/71 | HR 73 | Temp 97.9°F | Resp 18 | Wt 198.9 lb

## 2018-02-09 DIAGNOSIS — G4733 Obstructive sleep apnea (adult) (pediatric): Secondary | ICD-10-CM

## 2018-02-09 DIAGNOSIS — K219 Gastro-esophageal reflux disease without esophagitis: Secondary | ICD-10-CM

## 2018-02-09 DIAGNOSIS — K1230 Oral mucositis (ulcerative), unspecified: Secondary | ICD-10-CM

## 2018-02-09 DIAGNOSIS — Z5111 Encounter for antineoplastic chemotherapy: Secondary | ICD-10-CM

## 2018-02-09 DIAGNOSIS — Z803 Family history of malignant neoplasm of breast: Secondary | ICD-10-CM

## 2018-02-09 DIAGNOSIS — D509 Iron deficiency anemia, unspecified: Secondary | ICD-10-CM

## 2018-02-09 DIAGNOSIS — C787 Secondary malignant neoplasm of liver and intrahepatic bile duct: Secondary | ICD-10-CM

## 2018-02-09 DIAGNOSIS — R188 Other ascites: Secondary | ICD-10-CM | POA: Diagnosis not present

## 2018-02-09 DIAGNOSIS — G629 Polyneuropathy, unspecified: Secondary | ICD-10-CM

## 2018-02-09 DIAGNOSIS — R5381 Other malaise: Secondary | ICD-10-CM

## 2018-02-09 DIAGNOSIS — Z87442 Personal history of urinary calculi: Secondary | ICD-10-CM

## 2018-02-09 DIAGNOSIS — K222 Esophageal obstruction: Secondary | ICD-10-CM

## 2018-02-09 DIAGNOSIS — Y842 Radiological procedure and radiotherapy as the cause of abnormal reaction of the patient, or of later complication, without mention of misadventure at the time of the procedure: Secondary | ICD-10-CM

## 2018-02-09 DIAGNOSIS — R011 Cardiac murmur, unspecified: Secondary | ICD-10-CM

## 2018-02-09 DIAGNOSIS — Z923 Personal history of irradiation: Secondary | ICD-10-CM

## 2018-02-09 DIAGNOSIS — Z87891 Personal history of nicotine dependence: Secondary | ICD-10-CM

## 2018-02-09 DIAGNOSIS — C155 Malignant neoplasm of lower third of esophagus: Secondary | ICD-10-CM | POA: Diagnosis not present

## 2018-02-09 DIAGNOSIS — I10 Essential (primary) hypertension: Secondary | ICD-10-CM

## 2018-02-09 DIAGNOSIS — M1990 Unspecified osteoarthritis, unspecified site: Secondary | ICD-10-CM

## 2018-02-09 DIAGNOSIS — Z79899 Other long term (current) drug therapy: Secondary | ICD-10-CM

## 2018-02-09 DIAGNOSIS — R634 Abnormal weight loss: Secondary | ICD-10-CM

## 2018-02-09 DIAGNOSIS — Z9989 Dependence on other enabling machines and devices: Secondary | ICD-10-CM

## 2018-02-09 DIAGNOSIS — Z9884 Bariatric surgery status: Secondary | ICD-10-CM

## 2018-02-09 DIAGNOSIS — R5383 Other fatigue: Secondary | ICD-10-CM

## 2018-02-09 DIAGNOSIS — Z85820 Personal history of malignant melanoma of skin: Secondary | ICD-10-CM

## 2018-02-09 DIAGNOSIS — R630 Anorexia: Secondary | ICD-10-CM

## 2018-02-09 DIAGNOSIS — J449 Chronic obstructive pulmonary disease, unspecified: Secondary | ICD-10-CM

## 2018-02-09 LAB — URINALYSIS, DIPSTICK ONLY
Bilirubin Urine: NEGATIVE
Glucose, UA: NEGATIVE mg/dL
Hgb urine dipstick: NEGATIVE
Ketones, ur: NEGATIVE mg/dL
NITRITE: NEGATIVE
PH: 5 (ref 5.0–8.0)
Protein, ur: NEGATIVE mg/dL
SPECIFIC GRAVITY, URINE: 1.018 (ref 1.005–1.030)

## 2018-02-09 LAB — COMPREHENSIVE METABOLIC PANEL
ALT: 23 U/L (ref 0–44)
AST: 34 U/L (ref 15–41)
Albumin: 3.8 g/dL (ref 3.5–5.0)
Alkaline Phosphatase: 77 U/L (ref 38–126)
Anion gap: 10 (ref 5–15)
BILIRUBIN TOTAL: 0.7 mg/dL (ref 0.3–1.2)
BUN: 17 mg/dL (ref 8–23)
CHLORIDE: 106 mmol/L (ref 98–111)
CO2: 28 mmol/L (ref 22–32)
CREATININE: 0.74 mg/dL (ref 0.44–1.00)
Calcium: 9.2 mg/dL (ref 8.9–10.3)
GFR calc Af Amer: >60 mL/min (ref >60)
Glucose, Bld: 165 mg/dL — ABNORMAL HIGH (ref 70–99)
Potassium: 3.6 mmol/L (ref 3.5–5.1)
Sodium: 144 mmol/L (ref 135–145)
TOTAL PROTEIN: 7 g/dL (ref 6.5–8.1)

## 2018-02-09 LAB — CBC WITH DIFFERENTIAL/PLATELET
Basophils Absolute: 0 10*3/uL (ref 0–0.1)
Basophils Relative: 1 %
EOS PCT: 3 %
Eosinophils Absolute: 0.1 10*3/uL (ref 0–0.7)
HCT: 39.7 % (ref 35.0–47.0)
Hemoglobin: 13.3 g/dL (ref 12.0–16.0)
LYMPHS ABS: 0.7 10*3/uL — AB (ref 1.0–3.6)
Lymphocytes Relative: 18 %
MCH: 33.6 pg (ref 26.0–34.0)
MCHC: 33.5 g/dL (ref 32.0–36.0)
MCV: 100.3 fL — ABNORMAL HIGH (ref 80.0–100.0)
Monocytes Absolute: 0.1 10*3/uL — ABNORMAL LOW (ref 0.2–0.9)
Monocytes Relative: 4 %
Neutro Abs: 2.8 10*3/uL (ref 1.4–6.5)
Neutrophils Relative %: 74 %
PLATELETS: 216 10*3/uL (ref 150–440)
RBC: 3.96 MIL/uL (ref 3.80–5.20)
RDW: 15.2 % — AB (ref 11.5–14.5)
WBC: 3.7 10*3/uL (ref 3.6–11.0)

## 2018-02-09 MED ORDER — SODIUM CHLORIDE 0.9 % IV SOLN
Freq: Once | INTRAVENOUS | Status: AC
  Administered 2018-02-09: 11:00:00 via INTRAVENOUS
  Filled 2018-02-09: qty 250

## 2018-02-09 MED ORDER — HEPARIN SOD (PORK) LOCK FLUSH 100 UNIT/ML IV SOLN
500.0000 [IU] | Freq: Once | INTRAVENOUS | Status: AC | PRN
  Administered 2018-02-09: 500 [IU]

## 2018-02-09 MED ORDER — DIPHENHYDRAMINE HCL 50 MG/ML IJ SOLN
50.0000 mg | Freq: Once | INTRAMUSCULAR | Status: AC
  Administered 2018-02-09: 50 mg via INTRAVENOUS
  Filled 2018-02-09: qty 1

## 2018-02-09 MED ORDER — SODIUM CHLORIDE 0.9 % IV SOLN
80.0000 mg/m2 | Freq: Once | INTRAVENOUS | Status: AC
  Administered 2018-02-09: 162 mg via INTRAVENOUS
  Filled 2018-02-09: qty 27

## 2018-02-09 MED ORDER — DEXAMETHASONE SODIUM PHOSPHATE 10 MG/ML IJ SOLN
10.0000 mg | Freq: Once | INTRAMUSCULAR | Status: AC
  Administered 2018-02-09: 10 mg via INTRAVENOUS
  Filled 2018-02-09: qty 1

## 2018-02-09 MED ORDER — ACETAMINOPHEN 325 MG PO TABS
650.0000 mg | ORAL_TABLET | Freq: Once | ORAL | Status: AC
  Administered 2018-02-09: 650 mg via ORAL
  Filled 2018-02-09: qty 2

## 2018-02-09 MED ORDER — FAMOTIDINE IN NACL 20-0.9 MG/50ML-% IV SOLN
20.0000 mg | Freq: Once | INTRAVENOUS | Status: AC
  Administered 2018-02-09: 20 mg via INTRAVENOUS
  Filled 2018-02-09: qty 50

## 2018-02-09 MED ORDER — SODIUM CHLORIDE 0.9 % IV SOLN
8.0000 mg/kg | Freq: Once | INTRAVENOUS | Status: AC
  Administered 2018-02-09: 700 mg via INTRAVENOUS
  Filled 2018-02-09: qty 50

## 2018-02-09 NOTE — Progress Notes (Signed)
Hematology/Oncology Follow Up Note [facility]  [phone] [phone]  Patient Care Team: Tonia Ghent, MD as PCP - General (Family Medicine)   Name of the patient: [name]  [id number]  [birth year]   REASON FOR VISIT Follow up of chemotherapy management of esophageal adenocarcinoma  HISTORY OF PRESENT ILLNESS Patient is a 83 year old female who has metastatic esophageal cancer with liver involvement.  Weight loss 20 pounds for the past year prior to diagnosis.  # EGD during admission showed partially obstructive esophageal mass at the distal part of esophagus. Biopsy was taken. Pathology showed esophageal adenocarcinoma. #US biopsy of liver lesion to proof distant metastasis.  Report family history of breast cancer, but she has been having routine mammograms.  # Lives alone. She's a former smoker.  # Molecular Testing:  #[employer] Cdx: No reportable alternations with companion diagnostic (CDx) claims.  MS stable Tumor mutation burden: Cannot be determined, CCND3 amplification: Equivocal.  Amended reports on 06/04/2017, Tumor mutational burden changed from can not be determined to "TMB -low" on the re-analyzed samples.  # HER 2 negative.   Current Treatment S/p 6 cycles of FOLFOX, PET scan showed excellent partial response from both chemotherapy and radiation, result was discussed with patient.  currently on 5-FU maintenance.  S/p palliative radiation in January 2019 # Developed esophageal stricture secondary to radiation in April and got dilation via endoscopy. EGD shoed Benign-appearing esophageal stenosis. This is a result of radiation therapy.- LA Grade C radiation esophagitis. - The previously noted esophageal mass in Nov 2018 was much decreased indicating good response to radiation/chemotherapy. - Normal stomach.- Normal examined jejunum.- No specimens collected.    INTERVAL HISTORY  Patient with above history presents  for assessment prior to second line chemotherapy for treatment of metastatic esophageal cancer.  #For chemotherapy she reports tolerating well.  Denies any nausea vomiting diarrhea.  No abdominal pain as well. #Fatigue, she reports fatigue at worst right after the chemotherapy and then gradually returned to baseline. #Dysphagia, chronic problem for her.  She has had multiple dilatation with EGD.  Follows up with GI.  Recently she denies any worsening of swallowing difficulty. #Chronic neuropathy, pre-existing.  She takes Lyrica.  Denies any exacerbation or worsening of her neuropathy symptoms.  Stable. #Weight loss and lack of appetite.  She takes Marinol 5 mg twice daily.  Reports that appetite has been better.  Weight has been stable. ite: Gained 1 pound since last visit.  Also she was started on Marinol 5 mg twice daily.  Patient reports that she does not take it every day.  Review of systems Review of Systems  Constitutional: Positive for malaise/fatigue. Negative for chills, diaphoresis, fever and weight loss.  HENT: Negative for ear discharge, ear pain, hearing loss, nosebleeds and sore throat.        Mouth sore resolved.  Eyes: Negative for double vision, photophobia, pain, discharge and redness.  Respiratory: Negative for cough, hemoptysis, shortness of breath and wheezing.   Cardiovascular: Negative for chest pain, palpitations, orthopnea, claudication and leg swelling.  Gastrointestinal: Negative for abdominal pain, blood in stool, constipation, diarrhea, heartburn, melena, nausea and vomiting.       Dysphagia is better  Genitourinary: Negative for dysuria, flank pain, frequency, hematuria and urgency.  Musculoskeletal: Negative for back pain, joint pain, myalgias and neck pain.  Skin: Negative for itching and rash.  Neurological: Positive for tingling and sensory change. Negative for dizziness, tremors, focal weakness, weakness and headaches.  Endo/Heme/Allergies: Negative  for  environmental allergies. Does not bruise/bleed easily.  Psychiatric/Behavioral: Negative for depression, hallucinations, substance abuse and suicidal ideas. The patient is not nervous/anxious.     Allergies  Allergen Reactions  . Cefuroxime Nausea Only  . Codeine Nausea Only  . Doxycycline Other (See Comments)    Lip swelling  . Gabapentin Other (See Comments)    Swelling.   . Iohexol Itching    07-15-13 pt developed itching on fingers after contrast given. Dr. Irish Elders looked at pt and said to put in system as allergy. BB  . Other Other (See Comments)    Contrast Dye- caused fever, chills, awful feeling Bandages - itching, rash  . Oxycodone Other (See Comments)    nausea  . Quinolones Swelling    Lip swelling  . Synvisc [Hylan G-F 20] Swelling     Past Medical History:  Diagnosis Date  . Arthritis   . Asthma   . COPD (chronic obstructive pulmonary disease) ([place])   . Dysphonia   . Dyspnea   . Esophageal cancer ([place])   . GERD (gastroesophageal reflux disease)   . Heart murmur   . History of kidney stones   . Hypertension   . Hypokalemia 10/07/2017  . Melanoma ([place])   . OAB (overactive bladder)   . OSA on CPAP   . 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 Personal history of chemotherapy    esophageal cancer  . Personal history of radiation therapy    esophageal cancer  . Pupil asymmetry    R pupil defect  . Skin cancer   . Sleep apnea   . Spinal cord abscess      Past Surgical History:  Procedure Laterality Date  . ABDOMINAL HYSTERECTOMY    . ANTERIOR CERVICAL DECOMP/DISCECTOMY FUSION N/A 07/16/2013   Procedure: ANTERIOR CERVICAL DECOMPRESSION/DISCECTOMY FUSION mutlipleLEVELS C4-7;  Surgeon: Erline Levine, MD;  Location: [place] [facility]URO ORS;  Service: Neurosurgery;  Laterality: N/A;  . APPENDECTOMY    . carpel tunn Bilateral   . CATARACT EXTRACTION    . CATARACT EXTRACTION, BILATERAL    . CHOLECYSTECTOMY    . dental implant    . ESOPHAGOGASTRODUODENOSCOPY  Left 03/21/2017   Procedure: ESOPHAGOGASTRODUODENOSCOPY (EGD);  Surgeon: Virgel Manifold, MD;  Location: [facility] ENDOSCOPY;  Service: Endoscopy;  Laterality: Left;  . ESOPHAGOGASTRODUODENOSCOPY (EGD) WITH PROPOFOL N/A 08/20/2017   Procedure: ESOPHAGOGASTRODUODENOSCOPY (EGD) WITH PROPOFOL;  Surgeon: Virgel Manifold, MD;  Location: [facility] ENDOSCOPY;  Service: Endoscopy;  Laterality: N/A;  . ESOPHAGOGASTRODUODENOSCOPY (EGD) WITH PROPOFOL N/A 09/19/2017   Procedure: ESOPHAGOGASTRODUODENOSCOPY (EGD) WITH PROPOFOL;  Surgeon: Virgel Manifold, MD;  Location: [facility] ENDOSCOPY;  Service: Endoscopy;  Laterality: N/A;  . ESOPHAGOGASTRODUODENOSCOPY (EGD) WITH PROPOFOL N/A 10/03/2017   Procedure: ESOPHAGOGASTRODUODENOSCOPY (EGD) WITH PROPOFOL;  Surgeon: Virgel Manifold, MD;  Location: [facility] ENDOSCOPY;  Service: Endoscopy;  Laterality: N/A;  . ESOPHAGOGASTRODUODENOSCOPY (EGD) WITH PROPOFOL N/A 10/22/2017   Procedure: ESOPHAGOGASTRODUODENOSCOPY (EGD) WITH PROPOFOL;  Surgeon: Virgel Manifold, MD;  Location: [facility] ENDOSCOPY;  Service: Endoscopy;  Laterality: N/A;  . ESOPHAGOGASTRODUODENOSCOPY (EGD) WITH PROPOFOL N/A 11/18/2017   Procedure: ESOPHAGOGASTRODUODENOSCOPY (EGD) WITH PROPOFOL;  Surgeon: Virgel Manifold, MD;  Location: [facility] ENDOSCOPY;  Service: Endoscopy;  Laterality: N/A;  . ESOPHAGOGASTRODUODENOSCOPY (EGD) WITH PROPOFOL N/A 12/17/2017   Procedure: ESOPHAGOGASTRODUODENOSCOPY (EGD) WITH PROPOFOL;  Surgeon: Virgel Manifold, MD;  Location: [facility] ENDOSCOPY;  Service: Endoscopy;  Laterality: N/A;  . ESOPHAGOGASTRODUODENOSCOPY (EGD) WITH PROPOFOL N/A 01/08/2018   Procedure: ESOPHAGOGASTRODUODENOSCOPY (EGD) WITH PROPOFOL;  Surgeon: Virgel Manifold, MD;  Location: [facility] ENDOSCOPY;  Service: Endoscopy;  Laterality: N/A;  . ESOPHAGOGASTRODUODENOSCOPY (EGD) WITH PROPOFOL N/A 01/22/2018   Procedure: ESOPHAGOGASTRODUODENOSCOPY (EGD) WITH PROPOFOL;  Surgeon: Virgel Manifold, MD;  Location: [facility]  ENDOSCOPY;  Service: Endoscopy;  Laterality: N/A;  . EYE SURGERY    . GASTRIC BYPASS    . HERNIA REPAIR    . HIATAL HERNIA REPAIR    . JOINT REPLACEMENT    . MELANOMA EXCISION    . PORTA CATH INSERTION N/A 04/07/2017   Procedure: PORTA CATH INSERTION;  Surgeon: Algernon Huxley, MD;  Location: [place] CV LAB;  Service: Cardiovascular;  Laterality: N/A;  . POSTERIOR CERVICAL FUSION/FORAMINOTOMY N/A 07/28/2013   Procedure: Cervical four-seven  posterior cervical fusion;  Surgeon: Erline Levine, MD;  Location: [place] NEURO ORS;  Service: Neurosurgery;  Laterality: N/A;  . TONSILLECTOMY    . TOTAL KNEE ARTHROPLASTY      Social History   Socioeconomic History  . Marital status: Widowed    Spouse name: Not on file  . Number of children: 2  . Years of education: Not on file  . Highest education level: Master's degree (e.g., MA, MS, MEng, MEd, MSW, MBA)  Occupational History  . Not on file  Social Needs  . Financial resource strain: Not hard at all  . Food insecurity:    Worry: Never true    Inability: Never true  . Transportation needs:    Medical: No    Non-medical: No  Tobacco Use  . Smoking status: Former Smoker    Packs/day: 1.00    Years: 20.00    Pack years: 20.00    Types: Cigarettes    Last attempt to quit: 1977    Years since quitting: 42.7  . Smokeless tobacco: Never Used  Substance and Sexual Activity  . Alcohol use: No  . Drug use: Never  . Sexual activity: Never  Lifestyle  . Physical activity:    Days per week: 2 days    Minutes per session: 30 min  . Stress: Not at all  Relationships  . Social connections:    Talks on phone: More than three times a week    Gets together: More than three times a week    Attends religious service: More than 4 times per year    Active member of club or organization: Yes    Attends meetings of clubs or organizations: More than 4 times per year    Relationship status: Widowed  . Intimate partner violence:    Fear of current  or ex partner: No    Emotionally abused: No    Physically abused: No    Forced sexual activity: No  Other Topics Concern  . Not on file  Social History Narrative   Marital Status- Widowed    Lives by herself    Employement- Retired Pharmacist, hospital   Exercise hx- Does PT     Family History  Problem Relation Age of Onset  . Breast cancer Mother 8  . Arthritis-Osteo Mother   . Breast cancer Sister 6  . Bladder Cancer Sister   . Bladder Cancer Father   . Tongue cancer Father   . Congenital heart disease Father   . Prostate cancer Brother   . Uterine cancer Maternal Aunt   . Lung cancer Paternal Uncle   . Leukemia Maternal Grandmother   . Liver cancer Paternal Grandmother   . Colon cancer Neg Hx      Current  Outpatient Medications:  .  acetaminophen (TYLENOL) 325 MG tablet, Take 650 mg by mouth every 6 (six) hours as needed for moderate pain or headache. , Disp: , Rfl:  .  acyclovir (ZOVIRAX) 400 MG tablet, Take 1 tablet (400 mg total) by mouth 2 (two) times daily., Disp: 30 tablet, Rfl: 0 .  ALBUTEROL SULFATE HFA IN, Inhale into the lungs. , Disp: , Rfl:  .  bisacodyl (DULCOLAX) 10 MG suppository, Place rectally., Disp: , Rfl:  .  calcium carbonate (TUMS EX) 750 MG chewable tablet, Chew by mouth., Disp: , Rfl:  .  CARAFATE 1 GM/10ML suspension, TAKE 10MLS BY MOUTH FOUR TIMES DAILY, Disp: 420 mL, Rfl: 1 .  Cholecalciferol (VITAMIN D) 2000 UNITS CAPS, Take 2,000 Units by mouth daily., Disp: , Rfl:  .  cyanocobalamin 500 MCG tablet, Take 500 mcg every other day by mouth., Disp: , Rfl:  .  Diclofenac Sodium 1 % CREA, Apply to left arm as needed for pain three times daily, Disp: 120 g, Rfl: 0 .  dronabinol (MARINOL) 5 MG capsule, Take 1 capsule (5 mg total) by mouth 2 (two) times daily before a meal., Disp: 60 capsule, Rfl: 0 .  fluticasone (FLONASE) 50 MCG/ACT nasal spray, Place 1-2 sprays into both nostrils daily., Disp: 16 g, Rfl: 5 .  furosemide (LASIX) 20 MG tablet, Take 1 tablet (20  mg total) by mouth daily., Disp: 90 tablet, Rfl: 3 .  hydrocortisone cream 1 %, Apply 1 application topically daily as needed for itching. , Disp: , Rfl:  .  loratadine (CLARITIN) 10 MG tablet, Take 10 mg by mouth daily., Disp: , Rfl:  .  LYRICA 150 MG capsule, TAKE 1 CAPSULE BY MOUTH TWO TIMES DAILY, Disp: 180 capsule, Rfl: 1 .  metoprolol succinate (TOPROL-XL) 50 MG 24 hr tablet, TAKE 1 TABLET BY MOUTH DAILY, Disp: 90 tablet, Rfl: 1 .  Multiple Vitamins-Minerals (MULTIVITAMIN ADULT) CHEW, Chew by mouth daily., Disp: , Rfl:  .  oxybutynin (DITROPAN) 5 MG tablet, TAKE ONE TABLET BY MOUTH EVERY MORNING AND TAKE ONE-HALF TABLET AT BEDTIME, Disp: 135 tablet, Rfl: 0 .  pantoprazole (PROTONIX) 40 MG tablet, Take 40 mg by mouth daily., Disp: , Rfl:  .  Polyethyl Glycol-Propyl Glycol (SYSTANE OP), Apply 1 drop to eye daily as needed (dry eyes)., Disp: , Rfl:  .  potassium chloride (KLOR-CON) 20 MEQ packet, Take 20 mEq by mouth as needed (Daily as needed with fluid pill)., Disp: 30 packet, Rfl: 1 .  pyridOXINE (VITAMIN B-6) 100 MG tablet, Take 1 tablet (100 mg total) by mouth daily., Disp: 30 tablet, Rfl: 3 .  senna-docusate (SENOKOT-S) 8.6-50 MG tablet, Take 1 tablet at bedtime as needed by mouth for mild constipation., Disp: , Rfl:  .  traZODone (DESYREL) 50 MG tablet, TAKE 1/2 TABLET BY MOUTH AT BEDTIME IF NEEDED FOR SLEEP, Disp: 45 tablet, Rfl: 3 .  pantoprazole (PROTONIX) 40 MG tablet, Take 1 tablet (40 mg total) by mouth 2 (two) times daily before a meal., Disp: 60 tablet, Rfl: 1 .  ranitidine (ZANTAC) 150 MG/10ML syrup, Take 10 mLs (150 mg total) by mouth at bedtime., Disp: 300 mL, Rfl: 3 .  traMADol (ULTRAM) 50 MG tablet, Take 1 tablet (50 mg total) by mouth every 6 (six) hours as needed. (Patient not taking: Reported on 02/09/2018), Disp: 30 tablet, Rfl: 0 No current facility-administered medications for this visit.   Facility-Administered Medications Ordered in Other Visits:  .  PACLitaxel  (TAXOL) 162 mg in  sodium chloride 0.9 % 250 mL chemo infusion (</= '80mg'$ /m2), 80 mg/m2 (Treatment Plan Recorded), Intravenous, Once, Earlie Server, MD, Last Rate: 277 mL/hr at 02/09/18 1227, 162 mg at 02/09/18 1227 .  sodium chloride flush (NS) 0.9 % injection 10 mL, 10 mL, Intravenous, PRN, Earlie Server, MD, 10 mL at 01/26/18 0904  Physical exam:  Vitals:   02/09/18 0906  BP: 112/71  Pulse: 73  Resp: 18  Temp: 97.9 F (36.6 C)  TempSrc: Tympanic  Weight: 198 lb 14.4 oz (90.2 kg)  ECOG 1 Physical Exam  Constitutional: She is oriented to person, place, and time. No distress.  HENT:  Head: Normocephalic and atraumatic.  Nose: Nose normal.  Mouth/Throat: Oropharynx is clear and moist. No oropharyngeal exudate.  Eyes: Pupils are equal, round, and reactive to light. Conjunctivae and EOM are normal. Left eye exhibits no discharge. No scleral icterus.  Neck: Normal range of motion. Neck supple. No JVD present.  Cardiovascular: Normal rate and regular rhythm.  Murmur heard. Pulmonary/Chest: Effort normal and breath sounds normal. No respiratory distress. She has no wheezes. She has no rales. She exhibits no tenderness.  Abdominal: Soft. Bowel sounds are normal. She exhibits no distension and no mass. There is no tenderness. There is no rebound and no guarding.  Musculoskeletal: Normal range of motion. She exhibits no edema, tenderness or deformity.  Lymphadenopathy:    She has no cervical adenopathy.  Neurological: She is alert and oriented to person, place, and time. No cranial nerve deficit. She exhibits normal muscle tone. Coordination normal.  Skin: Skin is warm and dry. She is not diaphoretic. No erythema.  Psychiatric: Affect and judgment normal.    CMP Latest Ref Rng & Units 02/09/2018  Glucose 70 - 99 mg/dL 165(H)  BUN 8 - 23 mg/dL 17  Creatinine 0.44 - 1.00 mg/dL 0.74  Sodium 135 - 145 mmol/L 144  Potassium 3.5 - 5.1 mmol/L 3.6  Chloride 98 - 111 mmol/L 106  CO2 22 - 32 mmol/L 28    Calcium 8.9 - 10.3 mg/dL 9.2  Total Protein 6.5 - 8.1 g/dL 7.0  Total Bilirubin 0.3 - 1.2 mg/dL 0.7  Alkaline Phos 38 - 126 U/L 77  AST 15 - 41 U/L 34  ALT 0 - 44 U/L 23   CBC Latest Ref Rng & Units 02/09/2018  WBC 3.6 - 11.0 K/uL 3.7  Hemoglobin 12.0 - 16.0 g/dL 13.3  Hematocrit 35.0 - 47.0 % 39.7  Platelets 150 - 440 K/uL 216    RADIOGRAPHIC STUDIES: I have personally reviewed the radiological images as listed and agreed with the findings in the report. PET scan 01/23/2018  Interval development of loculated ascites within the upper abdomen which overlies the liver and spleen and exhibits increased radiotracer uptake. Findings worrisome for peritoneal carcinomatosis. Consider further evaluation with diagnostic paracentesis. 2. Soft tissue infiltration within the omentum with increased radiotracer uptake noted. Also worrisome for peritoneal carcinomatosis. 3. New hypermetabolic internal mammary lymph nodes and right CP angle lymph node. Suspicious for metastatic adenopathy. 4. Mild FDG uptake is identified localizing to the lower third of the esophagus within SUV max of 5.15.  Assessment and plan  Cancer Staging Malignant neoplasm of lower third of esophagus [facility]) Staging form: Esophagus - Adenocarcinoma, AJCC 8th Edition - Clinical stage from 04/10/2017: Stage IVB (cTX, cNX, pM1) - Signed by Earlie Server, MD on 04/10/2017  1. Encounter for antineoplastic chemotherapy   2. Esophageal stricture   3. Malignant neoplasm of lower third of esophagus (HCC)  4. Neuropathy   5. Heart murmur    #Esophageal cancer, progressed.  Now on second line treatment with Cyramza and paclitaxel. Tolerates Cyramza and paclitaxel well.   Labs were reviewed and discussed with patient and her sister.  Acceptable counts to proceed with today's paclitaxel and Cyramza.  # Esophageal Stricture, radiation induced.  Stable follows up with GI.   #  Chronic neuropathy: Continue Lyrica.  Symptoms have been  stable..   Follow-up in 2 weeks for assessment prior to cycle 2 Cyramza and paclitaxel.  We spent sufficient time to discuss many aspect of care, questions were answered to patient's satisfaction. Total face to face encounter time for this patient visit was 56mn. >50% of the time was  spent in counseling and coordination of care.   ZEarlie Server MD, PhD Hematology Oncology [facility] LLCat ACarolina Bone And Joint Surgery CenterPager- [phone]/30/19

## 2018-02-09 NOTE — Progress Notes (Signed)
Patient here for follow up. No concerns voiced.  °

## 2018-02-10 ENCOUNTER — Other Ambulatory Visit: Payer: Medicare Other

## 2018-02-10 ENCOUNTER — Ambulatory Visit: Payer: Medicare Other | Admitting: Oncology

## 2018-02-10 ENCOUNTER — Ambulatory Visit: Payer: Medicare Other

## 2018-02-10 LAB — CEA: CEA: 2.8 ng/mL (ref 0.0–4.7)

## 2018-02-18 ENCOUNTER — Other Ambulatory Visit: Payer: Self-pay | Admitting: Oncology

## 2018-02-23 ENCOUNTER — Other Ambulatory Visit: Payer: Self-pay

## 2018-02-23 ENCOUNTER — Inpatient Hospital Stay: Payer: Medicare Other

## 2018-02-23 ENCOUNTER — Inpatient Hospital Stay (HOSPITAL_BASED_OUTPATIENT_CLINIC_OR_DEPARTMENT_OTHER): Payer: Medicare Other | Admitting: Oncology

## 2018-02-23 ENCOUNTER — Encounter: Payer: Self-pay | Admitting: Oncology

## 2018-02-23 ENCOUNTER — Inpatient Hospital Stay: Payer: Medicare Other | Attending: Oncology

## 2018-02-23 VITALS — BP 152/84 | HR 62 | Temp 96.9°F | Resp 18 | Wt 203.7 lb

## 2018-02-23 DIAGNOSIS — R5381 Other malaise: Secondary | ICD-10-CM | POA: Diagnosis not present

## 2018-02-23 DIAGNOSIS — M1990 Unspecified osteoarthritis, unspecified site: Secondary | ICD-10-CM

## 2018-02-23 DIAGNOSIS — C155 Malignant neoplasm of lower third of esophagus: Secondary | ICD-10-CM | POA: Diagnosis present

## 2018-02-23 DIAGNOSIS — G4733 Obstructive sleep apnea (adult) (pediatric): Secondary | ICD-10-CM | POA: Diagnosis not present

## 2018-02-23 DIAGNOSIS — Z5112 Encounter for antineoplastic immunotherapy: Secondary | ICD-10-CM | POA: Diagnosis not present

## 2018-02-23 DIAGNOSIS — I10 Essential (primary) hypertension: Secondary | ICD-10-CM | POA: Diagnosis not present

## 2018-02-23 DIAGNOSIS — Z87442 Personal history of urinary calculi: Secondary | ICD-10-CM | POA: Insufficient documentation

## 2018-02-23 DIAGNOSIS — Z79899 Other long term (current) drug therapy: Secondary | ICD-10-CM

## 2018-02-23 DIAGNOSIS — Z803 Family history of malignant neoplasm of breast: Secondary | ICD-10-CM | POA: Diagnosis not present

## 2018-02-23 DIAGNOSIS — Z85820 Personal history of malignant melanoma of skin: Secondary | ICD-10-CM | POA: Insufficient documentation

## 2018-02-23 DIAGNOSIS — C787 Secondary malignant neoplasm of liver and intrahepatic bile duct: Secondary | ICD-10-CM

## 2018-02-23 DIAGNOSIS — Z9989 Dependence on other enabling machines and devices: Secondary | ICD-10-CM | POA: Diagnosis not present

## 2018-02-23 DIAGNOSIS — K219 Gastro-esophageal reflux disease without esophagitis: Secondary | ICD-10-CM | POA: Insufficient documentation

## 2018-02-23 DIAGNOSIS — E785 Hyperlipidemia, unspecified: Secondary | ICD-10-CM | POA: Insufficient documentation

## 2018-02-23 DIAGNOSIS — R5382 Chronic fatigue, unspecified: Secondary | ICD-10-CM | POA: Insufficient documentation

## 2018-02-23 DIAGNOSIS — Z5111 Encounter for antineoplastic chemotherapy: Secondary | ICD-10-CM | POA: Diagnosis not present

## 2018-02-23 DIAGNOSIS — K222 Esophageal obstruction: Secondary | ICD-10-CM

## 2018-02-23 DIAGNOSIS — J449 Chronic obstructive pulmonary disease, unspecified: Secondary | ICD-10-CM | POA: Diagnosis not present

## 2018-02-23 DIAGNOSIS — E876 Hypokalemia: Secondary | ICD-10-CM | POA: Diagnosis not present

## 2018-02-23 DIAGNOSIS — G629 Polyneuropathy, unspecified: Secondary | ICD-10-CM | POA: Diagnosis not present

## 2018-02-23 DIAGNOSIS — Z87891 Personal history of nicotine dependence: Secondary | ICD-10-CM | POA: Diagnosis not present

## 2018-02-23 LAB — COMPREHENSIVE METABOLIC PANEL
ALBUMIN: 3.5 g/dL (ref 3.5–5.0)
ALK PHOS: 64 U/L (ref 38–126)
ALT: 20 U/L (ref 0–44)
AST: 29 U/L (ref 15–41)
Anion gap: 8 (ref 5–15)
BUN: 13 mg/dL (ref 8–23)
CHLORIDE: 106 mmol/L (ref 98–111)
CO2: 30 mmol/L (ref 22–32)
CREATININE: 0.61 mg/dL (ref 0.44–1.00)
Calcium: 9 mg/dL (ref 8.9–10.3)
GFR calc Af Amer: >60 mL/min (ref >60)
GFR calc non Af Amer: >60 mL/min (ref >60)
GLUCOSE: 102 mg/dL — AB (ref 70–99)
Potassium: 3.9 mmol/L (ref 3.5–5.1)
SODIUM: 144 mmol/L (ref 135–145)
Total Bilirubin: 0.6 mg/dL (ref 0.3–1.2)
Total Protein: 6.2 g/dL — ABNORMAL LOW (ref 6.5–8.1)

## 2018-02-23 LAB — CBC WITH DIFFERENTIAL/PLATELET
ABS IMMATURE GRANULOCYTES: 0.05 10*3/uL (ref 0.00–0.07)
Basophils Absolute: 0 10*3/uL (ref 0.0–0.1)
Basophils Relative: 1 %
EOS PCT: 3 %
Eosinophils Absolute: 0.1 10*3/uL (ref 0.0–0.5)
HEMATOCRIT: 37.4 % (ref 36.0–46.0)
HEMOGLOBIN: 12.1 g/dL (ref 12.0–15.0)
Immature Granulocytes: 2 %
LYMPHS ABS: 0.8 10*3/uL (ref 0.7–4.0)
Lymphocytes Relative: 30 %
MCH: 32.7 pg (ref 26.0–34.0)
MCHC: 32.4 g/dL (ref 30.0–36.0)
MCV: 101.1 fL — ABNORMAL HIGH (ref 80.0–100.0)
MONO ABS: 0.5 10*3/uL (ref 0.1–1.0)
MONOS PCT: 17 %
NEUTROS ABS: 1.3 10*3/uL — AB (ref 1.7–7.7)
Neutrophils Relative %: 47 %
Platelets: 176 10*3/uL (ref 150–400)
RBC: 3.7 MIL/uL — ABNORMAL LOW (ref 3.87–5.11)
RDW: 14.7 % (ref 11.5–15.5)
WBC: 2.7 10*3/uL — ABNORMAL LOW (ref 4.0–10.5)
nRBC: 0 % (ref 0.0–0.2)

## 2018-02-23 LAB — URINALYSIS, DIPSTICK ONLY
Bilirubin Urine: NEGATIVE
Glucose, UA: NEGATIVE mg/dL
Hgb urine dipstick: NEGATIVE
Ketones, ur: NEGATIVE mg/dL
NITRITE: NEGATIVE
PROTEIN: NEGATIVE mg/dL
SPECIFIC GRAVITY, URINE: 1.017 (ref 1.005–1.030)
pH: 5 (ref 5.0–8.0)

## 2018-02-23 MED ORDER — HEPARIN SOD (PORK) LOCK FLUSH 100 UNIT/ML IV SOLN
500.0000 [IU] | Freq: Once | INTRAVENOUS | Status: AC
  Administered 2018-02-23: 500 [IU] via INTRAVENOUS
  Filled 2018-02-23: qty 5

## 2018-02-23 MED ORDER — DEXAMETHASONE SODIUM PHOSPHATE 10 MG/ML IJ SOLN
10.0000 mg | Freq: Once | INTRAMUSCULAR | Status: AC
  Administered 2018-02-23: 10 mg via INTRAVENOUS
  Filled 2018-02-23: qty 1

## 2018-02-23 MED ORDER — SODIUM CHLORIDE 0.9 % IV SOLN
Freq: Once | INTRAVENOUS | Status: AC
  Administered 2018-02-23: 10:00:00 via INTRAVENOUS
  Filled 2018-02-23: qty 250

## 2018-02-23 MED ORDER — ACETAMINOPHEN 325 MG PO TABS
650.0000 mg | ORAL_TABLET | Freq: Once | ORAL | Status: AC
  Administered 2018-02-23: 650 mg via ORAL
  Filled 2018-02-23: qty 2

## 2018-02-23 MED ORDER — DIPHENHYDRAMINE HCL 50 MG/ML IJ SOLN
50.0000 mg | Freq: Once | INTRAMUSCULAR | Status: AC
  Administered 2018-02-23: 50 mg via INTRAVENOUS
  Filled 2018-02-23: qty 1

## 2018-02-23 MED ORDER — SODIUM CHLORIDE 0.9 % IV SOLN
8.0000 mg/kg | Freq: Once | INTRAVENOUS | Status: AC
  Administered 2018-02-23: 700 mg via INTRAVENOUS
  Filled 2018-02-23: qty 50

## 2018-02-23 MED ORDER — SODIUM CHLORIDE 0.9 % IV SOLN
80.0000 mg/m2 | Freq: Once | INTRAVENOUS | Status: AC
  Administered 2018-02-23: 162 mg via INTRAVENOUS
  Filled 2018-02-23: qty 27

## 2018-02-23 MED ORDER — FAMOTIDINE IN NACL 20-0.9 MG/50ML-% IV SOLN
20.0000 mg | Freq: Once | INTRAVENOUS | Status: AC
  Administered 2018-02-23: 20 mg via INTRAVENOUS
  Filled 2018-02-23: qty 50

## 2018-02-23 MED ORDER — SODIUM CHLORIDE 0.9% FLUSH
10.0000 mL | Freq: Once | INTRAVENOUS | Status: AC
  Administered 2018-02-23: 10 mL via INTRAVENOUS
  Filled 2018-02-23: qty 10

## 2018-02-23 NOTE — Progress Notes (Signed)
Hematology/Oncology Follow Up Note [facility]  [phone] [phone]  Patient Care Team: Tonia Ghent, MD as PCP - General (Family Medicine)   Name of the patient: [name]  [id number]  09/17/1934   REASON FOR VISIT Follow up of chemotherapy management of esophageal adenocarcinoma  HISTORY OF PRESENT ILLNESS Patient is a 83 year old female who has metastatic esophageal cancer with liver involvement.  Weight loss 20 pounds for the past year prior to diagnosis.  # EGD during admission showed partially obstructive esophageal mass at the distal part of esophagus. Biopsy was taken. Pathology showed esophageal adenocarcinoma. #US biopsy of liver lesion to proof distant metastasis.  Report family history of breast cancer, but she has been having routine mammograms.  # Lives alone. She's a former smoker.  # Molecular Testing:  #[employer] Cdx: No reportable alternations with companion diagnostic (CDx) claims.  MS stable Tumor mutation burden: Cannot be determined, CCND3 amplification: Equivocal.  Amended reports on 06/04/2017, Tumor mutational burden changed from can not be determined to "TMB -low" on the re-analyzed samples.  # HER 2 negative.   Current Treatment S/p 6 cycles of FOLFOX, PET scan showed excellent partial response from both chemotherapy and radiation, result was discussed with patient.  currently on 5-FU maintenance.  S/p palliative radiation in January 2019 # Developed esophageal stricture secondary to radiation in April and got dilation via endoscopy. EGD shoed Benign-appearing esophageal stenosis. This is a result of radiation therapy.- LA Grade C radiation esophagitis. - The previously noted esophageal mass in Nov 2018 was much decreased indicating good response to radiation/chemotherapy. - Normal stomach.- Normal examined jejunum.- No specimens collected.    INTERVAL HISTORY  Patient with above history presents  for assessment prior to second line chemotherapy for treatment of metastatic esophageal cancer.  #Chemotherapy, she reports tolerating well.  Denies any nausea vomiting diarrhea.  No abdominal pains as well. # Fatigue: reports worsening fatigue. Chronic onset, perisistent, no aggravating or improving factors, no associated symptoms.  #Dysphagia, chronic problem for her.  Status post multiple dilatation with EGD and a steroid injection.  Follows up with GI.  She reports dysphagia problem has been improved recently.  #Chronic neuropathy, pre-existing.  She takes Lyrica.  Denies any exacerbation or worsening of her neuropathy symptoms.  Stable.   #Weight loss and lack of appetite.  She takes Marinol 5 mg twice daily.  She has gained weight since last visit.  Review of systems Review of Systems  Constitutional: Positive for malaise/fatigue. Negative for chills, diaphoresis, fever and weight loss.  HENT: Negative for ear discharge, ear pain, hearing loss, nosebleeds and sore throat.        Mouth sore resolved.  Eyes: Negative for double vision, photophobia, pain, discharge and redness.  Respiratory: Negative for cough, hemoptysis, shortness of breath and wheezing.   Cardiovascular: Negative for chest pain, palpitations, orthopnea, claudication and leg swelling.  Gastrointestinal: Negative for abdominal pain, blood in stool, constipation, diarrhea, heartburn, melena, nausea and vomiting.       Dysphagia is better  Genitourinary: Negative for dysuria, flank pain, frequency, hematuria and urgency.  Musculoskeletal: Negative for back pain, joint pain, myalgias and neck pain.  Skin: Negative for itching and rash.  Neurological: Positive for tingling and sensory change. Negative for dizziness, tremors, focal weakness, weakness and headaches.  Endo/Heme/Allergies: Negative for environmental allergies. Does not bruise/bleed easily.  Psychiatric/Behavioral: Negative for depression, hallucinations,  substance abuse and suicidal ideas. The patient is not nervous/anxious.  Allergies  Allergen Reactions  . Cefuroxime Nausea Only  . Codeine Nausea Only  . Doxycycline Other (See Comments)    Lip swelling  . Gabapentin Other (See Comments)    Swelling.   . Iohexol Itching    07-15-13 pt developed itching on fingers after contrast given. Dr. Irish Elders looked at pt and said to put in system as allergy. BB  . Other Other (See Comments)    Contrast Dye- caused fever, chills, awful feeling Bandages - itching, rash  . Oxycodone Other (See Comments)    nausea  . Quinolones Swelling    Lip swelling  . Synvisc [Hylan G-F 20] Swelling     Past Medical History:  Diagnosis Date  . Arthritis   . Asthma   . COPD (chronic obstructive pulmonary disease) ([place])   . Dysphonia   . Dyspnea   . Esophageal cancer ([place])   . GERD (gastroesophageal reflux disease)   . Heart murmur   . History of kidney stones   . Hypertension   . Hypokalemia 10/07/2017  . Melanoma ([place])   . OAB (overactive bladder)   . OSA on CPAP   . 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 Personal history of chemotherapy    esophageal cancer  . Personal history of radiation therapy    esophageal cancer  . Pupil asymmetry    R pupil defect  . Skin cancer   . Sleep apnea   . Spinal cord abscess      Past Surgical History:  Procedure Laterality Date  . ABDOMINAL HYSTERECTOMY    . ANTERIOR CERVICAL DECOMP/DISCECTOMY FUSION N/A 07/16/2013   Procedure: ANTERIOR CERVICAL DECOMPRESSION/DISCECTOMY FUSION mutlipleLEVELS C4-7;  Surgeon: Erline Levine, MD;  Location: [facility] [facility];  Service: Neurosurgery;  Laterality: N/A;  . APPENDECTOMY    . carpel tunn Bilateral   . CATARACT EXTRACTION    . CATARACT EXTRACTION, BILATERAL    . CHOLECYSTECTOMY    . dental implant    . ESOPHAGOGASTRODUODENOSCOPY Left 03/21/2017   Procedure: ESOPHAGOGASTRODUODENOSCOPY (EGD);  Surgeon: Virgel Manifold, MD;  Location: [facility]  ENDOSCOPY;  Service: Endoscopy;  Laterality: Left;  . ESOPHAGOGASTRODUODENOSCOPY (EGD) WITH PROPOFOL N/A 08/20/2017   Procedure: ESOPHAGOGASTRODUODENOSCOPY (EGD) WITH PROPOFOL;  Surgeon: Virgel Manifold, MD;  Location: [facility] ENDOSCOPY;  Service: Endoscopy;  Laterality: N/A;  . ESOPHAGOGASTRODUODENOSCOPY (EGD) WITH PROPOFOL N/A 09/19/2017   Procedure: ESOPHAGOGASTRODUODENOSCOPY (EGD) WITH PROPOFOL;  Surgeon: Virgel Manifold, MD;  Location: [facility] ENDOSCOPY;  Service: Endoscopy;  Laterality: N/A;  . ESOPHAGOGASTRODUODENOSCOPY (EGD) WITH PROPOFOL N/A 10/03/2017   Procedure: ESOPHAGOGASTRODUODENOSCOPY (EGD) WITH PROPOFOL;  Surgeon: Virgel Manifold, MD;  Location: [facility] ENDOSCOPY;  Service: Endoscopy;  Laterality: N/A;  . ESOPHAGOGASTRODUODENOSCOPY (EGD) WITH PROPOFOL N/A 10/22/2017   Procedure: ESOPHAGOGASTRODUODENOSCOPY (EGD) WITH PROPOFOL;  Surgeon: Virgel Manifold, MD;  Location: [facility] ENDOSCOPY;  Service: Endoscopy;  Laterality: N/A;  . ESOPHAGOGASTRODUODENOSCOPY (EGD) WITH PROPOFOL N/A 11/18/2017   Procedure: ESOPHAGOGASTRODUODENOSCOPY (EGD) WITH PROPOFOL;  Surgeon: Virgel Manifold, MD;  Location: [facility] ENDOSCOPY;  Service: Endoscopy;  Laterality: N/A;  . ESOPHAGOGASTRODUODENOSCOPY (EGD) WITH PROPOFOL N/A 12/17/2017   Procedure: ESOPHAGOGASTRODUODENOSCOPY (EGD) WITH PROPOFOL;  Surgeon: Virgel Manifold, MD;  Location: [facility] ENDOSCOPY;  Service: Endoscopy;  Laterality: N/A;  . ESOPHAGOGASTRODUODENOSCOPY (EGD) WITH PROPOFOL N/A 01/08/2018   Procedure: ESOPHAGOGASTRODUODENOSCOPY (EGD) WITH PROPOFOL;  Surgeon: Virgel Manifold, MD;  Location: [facility] ENDOSCOPY;  Service: Endoscopy;  Laterality: N/A;  . ESOPHAGOGASTRODUODENOSCOPY (EGD) WITH PROPOFOL N/A 01/22/2018   Procedure: ESOPHAGOGASTRODUODENOSCOPY (EGD)  WITH PROPOFOL;  Surgeon: Virgel Manifold, MD;  Location: [facility] ENDOSCOPY;  Service: Endoscopy;  Laterality: N/A;  . EYE SURGERY    . GASTRIC BYPASS    . HERNIA REPAIR    .  HIATAL HERNIA REPAIR    . JOINT REPLACEMENT    . MELANOMA EXCISION    . PORTA CATH INSERTION N/A 04/07/2017   Procedure: PORTA CATH INSERTION;  Surgeon: Algernon Huxley, MD;  Location: [place] CV LAB;  Service: Cardiovascular;  Laterality: N/A;  . POSTERIOR CERVICAL FUSION/FORAMINOTOMY N/A 07/28/2013   Procedure: Cervical four-seven  posterior cervical fusion;  Surgeon: Erline Levine, MD;  Location: [place] [facility];  Service: Neurosurgery;  Laterality: N/A;  . TONSILLECTOMY    . TOTAL KNEE ARTHROPLASTY      Social History   Socioeconomic History  . Marital status: Widowed    Spouse name: Not on file  . Number of children: 2  . Years of education: Not on file  . Highest education level: Master's degree (e.g., MA, MS, MEng, MEd, MSW, MBA)  Occupational History  . Not on file  Social Needs  . Financial resource strain: Not hard at all  . Food insecurity:    Worry: Never true    Inability: Never true  . Transportation needs:    Medical: No    Non-medical: No  Tobacco Use  . Smoking status: Former Smoker    Packs/day: 1.00    Years: 20.00    Pack years: 20.00    Types: Cigarettes    Last attempt to quit: 1977    Years since quitting: 42.8  . Smokeless tobacco: Never Used  Substance and Sexual Activity  . Alcohol use: No  . Drug use: Never  . Sexual activity: Never  Lifestyle  . Physical activity:    Days per week: 2 days    Minutes per session: 30 min  . Stress: Not at all  Relationships  . Social connections:    Talks on phone: More than three times a week    Gets together: More than three times a week    Attends religious service: More than 4 times per year    Active member of club or organization: Yes    Attends meetings of clubs or organizations: More than 4 times per year    Relationship status: Widowed  . Intimate partner violence:    Fear of current or ex partner: No    Emotionally abused: No    Physically abused: No    Forced sexual activity: No  Other  Topics Concern  . Not on file  Social History Narrative   Marital Status- Widowed    Lives by herself    Employement- Retired Pharmacist, hospital   Exercise hx- Does PT     Family History  Problem Relation Age of Onset  . Breast cancer Mother 66  . Arthritis-Osteo Mother   . Breast cancer Sister 76  . Bladder Cancer Sister   . Bladder Cancer Father   . Tongue cancer Father   . Congenital heart disease Father   . Prostate cancer Brother   . Uterine cancer Maternal Aunt   . Lung cancer Paternal Uncle   . Leukemia Maternal Grandmother   . Liver cancer Paternal Grandmother   . Colon cancer Neg Hx      Current Outpatient Medications:  .  acetaminophen (TYLENOL) 325 MG tablet, Take 650 mg by mouth every 6 (six) hours as needed for moderate pain or headache. , Disp: ,  Rfl:  .  acyclovir (ZOVIRAX) 400 MG tablet, Take 1 tablet (400 mg total) by mouth 2 (two) times daily., Disp: 30 tablet, Rfl: 0 .  ALBUTEROL SULFATE HFA IN, Inhale into the lungs. , Disp: , Rfl:  .  bisacodyl (DULCOLAX) 10 MG suppository, Place rectally., Disp: , Rfl:  .  calcium carbonate (TUMS EX) 750 MG chewable tablet, Chew by mouth., Disp: , Rfl:  .  CARAFATE 1 GM/10ML suspension, TAKE 10MLS BY MOUTH FOUR TIMES DAILY, Disp: 420 mL, Rfl: 1 .  Cholecalciferol (VITAMIN D) 2000 UNITS CAPS, Take 2,000 Units by mouth daily., Disp: , Rfl:  .  cyanocobalamin 500 MCG tablet, Take 500 mcg every other day by mouth., Disp: , Rfl:  .  Diclofenac Sodium 1 % CREA, Apply to left arm as needed for pain three times daily, Disp: 120 g, Rfl: 0 .  dronabinol (MARINOL) 5 MG capsule, Take 1 capsule (5 mg total) by mouth 2 (two) times daily before a meal., Disp: 60 capsule, Rfl: 0 .  fluticasone (FLONASE) 50 MCG/ACT nasal spray, Place 1-2 sprays into both nostrils daily., Disp: 16 g, Rfl: 5 .  furosemide (LASIX) 20 MG tablet, Take 1 tablet (20 mg total) by mouth daily., Disp: 90 tablet, Rfl: 3 .  hydrocortisone cream 1 %, Apply 1 application  topically daily as needed for itching. , Disp: , Rfl:  .  loratadine (CLARITIN) 10 MG tablet, Take 10 mg by mouth daily., Disp: , Rfl:  .  LYRICA 150 MG capsule, TAKE 1 CAPSULE BY MOUTH TWO TIMES DAILY, Disp: 180 capsule, Rfl: 1 .  metoprolol succinate (TOPROL-XL) 50 MG 24 hr tablet, TAKE 1 TABLET BY MOUTH DAILY, Disp: 90 tablet, Rfl: 1 .  Multiple Vitamins-Minerals (MULTIVITAMIN ADULT) CHEW, Chew by mouth daily., Disp: , Rfl:  .  oxybutynin (DITROPAN) 5 MG tablet, TAKE ONE TABLET BY MOUTH EVERY MORNING AND TAKE ONE-HALF TABLET AT BEDTIME, Disp: 135 tablet, Rfl: 0 .  pantoprazole (PROTONIX) 40 MG tablet, Take 40 mg by mouth daily., Disp: , Rfl:  .  Polyethyl Glycol-Propyl Glycol (SYSTANE OP), Apply 1 drop to eye daily as needed (dry eyes)., Disp: , Rfl:  .  potassium chloride (KLOR-CON) 20 MEQ packet, Take 20 mEq by mouth as needed (Daily as needed with fluid pill)., Disp: 30 packet, Rfl: 1 .  pyridOXINE (VITAMIN B-6) 100 MG tablet, Take 1 tablet (100 mg total) by mouth daily., Disp: 30 tablet, Rfl: 3 .  senna-docusate (SENOKOT-S) 8.6-50 MG tablet, Take 1 tablet at bedtime as needed by mouth for mild constipation., Disp: , Rfl:  .  traMADol (ULTRAM) 50 MG tablet, Take 1 tablet (50 mg total) by mouth every 6 (six) hours as needed., Disp: 30 tablet, Rfl: 0 .  traZODone (DESYREL) 50 MG tablet, TAKE 1/2 TABLET BY MOUTH AT BEDTIME IF NEEDED FOR SLEEP, Disp: 45 tablet, Rfl: 3 .  pantoprazole (PROTONIX) 40 MG tablet, Take 1 tablet (40 mg total) by mouth 2 (two) times daily before a meal., Disp: 60 tablet, Rfl: 1 .  ranitidine (ZANTAC) 150 MG/10ML syrup, Take 10 mLs (150 mg total) by mouth at bedtime., Disp: 300 mL, Rfl: 3 No current facility-administered medications for this visit.   Facility-Administered Medications Ordered in Other Visits:  .  famotidine (PEPCID) IVPB 20 mg premix, 20 mg, Intravenous, Once, Earlie Server, MD, Last Rate: 100 mL/hr at 02/23/18 1031, 20 mg at 02/23/18 1031 .  heparin lock  flush 100 unit/mL, 500 Units, Intravenous, Once, Earlie Server, MD .  PACLitaxel (TAXOL) 162 mg in sodium chloride 0.9 % 250 mL chemo infusion (</= 76m/m2), 80 mg/m2 (Treatment Plan Recorded), Intravenous, Once, YEarlie Server MD .  ramucirumab ([facility] 700 mg in sodium chloride 0.9 % 180 mL chemo infusion, 8 mg/kg (Treatment Plan Recorded), Intravenous, Once, YEarlie Server MD .  sodium chloride flush (NS) 0.9 % injection 10 mL, 10 mL, Intravenous, PRN, YEarlie Server MD, 10 mL at 01/26/18 0904  Physical exam:  Vitals:   02/23/18 0835  BP: (!) 152/84  Pulse: 62  Resp: 18  Temp: (!) 96.9 F (36.1 C)  TempSrc: Tympanic  Weight: 203 lb 11.2 oz (92.4 kg)  ECOG 1 Physical Exam  Constitutional: She is oriented to person, place, and time. No distress.  HENT:  Head: Normocephalic and atraumatic.  Nose: Nose normal.  Mouth/Throat: Oropharynx is clear and moist. No oropharyngeal exudate.  Eyes: Pupils are equal, round, and reactive to light. Conjunctivae and EOM are normal. Left eye exhibits no discharge. No scleral icterus.  Neck: Normal range of motion. Neck supple. No JVD present.  Cardiovascular: Normal rate and regular rhythm.  Murmur heard. Pulmonary/Chest: Effort normal and breath sounds normal. No respiratory distress. She has no wheezes. She has no rales. She exhibits no tenderness.  Abdominal: Soft. Bowel sounds are normal. She exhibits no distension and no mass. There is no tenderness. There is no rebound and no guarding.  Musculoskeletal: Normal range of motion. She exhibits no edema, tenderness or deformity.  Lymphadenopathy:    She has no cervical adenopathy.  Neurological: She is alert and oriented to person, place, and time. No cranial nerve deficit. She exhibits normal muscle tone. Coordination normal.  Skin: Skin is warm and dry. She is not diaphoretic. No erythema.  Psychiatric: Affect and judgment normal.    CMP Latest Ref Rng & Units 02/23/2018  Glucose 70 - 99 mg/dL 102(H)  BUN 8 -  23 mg/dL 13  Creatinine 0.44 - 1.00 mg/dL 0.61  Sodium 135 - 145 mmol/L 144  Potassium 3.5 - 5.1 mmol/L 3.9  Chloride 98 - 111 mmol/L 106  CO2 22 - 32 mmol/L 30  Calcium 8.9 - 10.3 mg/dL 9.0  Total Protein 6.5 - 8.1 g/dL 6.2(L)  Total Bilirubin 0.3 - 1.2 mg/dL 0.6  Alkaline Phos 38 - 126 U/L 64  AST 15 - 41 U/L 29  ALT 0 - 44 U/L 20   CBC Latest Ref Rng & Units 02/23/2018  WBC 4.0 - 10.5 K/uL 2.7(L)  Hemoglobin 12.0 - 15.0 g/dL 12.1  Hematocrit 36.0 - 46.0 % 37.4  Platelets 150 - 400 K/uL 176    RADIOGRAPHIC STUDIES: I have personally reviewed the radiological images as listed and agreed with the findings in the report. PET scan 01/23/2018  Interval development of loculated ascites within the upper abdomen which overlies the liver and spleen and exhibits increased radiotracer uptake. Findings worrisome for peritoneal carcinomatosis. Consider further evaluation with diagnostic paracentesis. 2. Soft tissue infiltration within the omentum with increased radiotracer uptake noted. Also worrisome for peritoneal carcinomatosis. 3. New hypermetabolic internal mammary lymph nodes and right CP angle lymph node. Suspicious for metastatic adenopathy. 4. Mild FDG uptake is identified localizing to the lower third of the esophagus within SUV max of 5.15.  Assessment and plan  Cancer Staging Malignant neoplasm of lower third of esophagus ([facility] Staging form: Esophagus - Adenocarcinoma, AJCC 8th Edition - Clinical stage from 04/10/2017: Stage IVB (cTX, cNX, pM1) - Signed by YEarlie Server MD on 04/10/2017  1. Encounter  for antineoplastic chemotherapy   2. Malignant neoplasm of lower third of esophagus (HCC)   3. Esophageal stricture   4. Neuropathy    #Esophageal cancer, progressed.  Currently on second line treatment with Cyramza and paclitaxel.  Tolerates well.  Labs were reviewed and discussed with patient and her sister.  Acceptable counts to proceed with today's paclitaxel and Cyramza.  #   Chronic neuropathy: Continue Lyrica.  Stable symptoms...  #Chronic fatigue, likely due to chemotherapy and disease.  Continue to monitor. #Neutropenia, grade 1.  ANC 1.3.  Acceptable to proceed with today's treatment.  Continue weekly labs for monitoring. She will proceed with day 8 paclitaxel next week Follow-up in 2 weeks for assessment prior to day 15 Cyramza and paclitaxel. We spent sufficient time to discuss many aspect of care, questions were answered to patient's satisfaction. Total face to face encounter time for this patient visit was 25 min. >50% of the time was  spent in counseling and coordination of care.   Earlie Server, MD, PhD Hematology Oncology [facility] at [facility] Pager- [phone] 02/23/18

## 2018-02-23 NOTE — Progress Notes (Signed)
Nutrition Follow-up:   Patient with stage IV esophageal cancer with liver mets.  Patient receiving chemotherapy.    Met with patient during infusion.  Patient sleepy during visit.  Reports that appetite is good. Reports that she can tolerate all consistency of foods.  Drinks boost plus shakes about 2 per day. Likes frosted flakes for breakfast, lunch is sometimes soup or goes out for chicken salad.  Regularly goes out to eat for supper.  Usually has meat and vegetables.    No nutrition impact symptoms reported    Medications: reviewed  Labs: glucose 102  Anthropometrics:   Weight 203 lb today increased from last visit on 9/30 (198lb)   NUTRITION DIAGNOSIS: Inadequate oral intake   INTERVENTION:  Encouraged patient to continue to focus on high calorie, high protein foods. Discussed examples of foods that meet this criteria. Encouraged continuing high calorie shakes for added nutrition    MONITORING, EVALUATION, GOAL: weight trends, intake   NEXT VISIT: October 28 during infusion (Monday)  [name] B. Zenia Resides, [place], [place] Registered Dietitian [phone] (pager)

## 2018-02-23 NOTE — Progress Notes (Signed)
Patient here for follow up. She states feeling very tired.

## 2018-02-24 LAB — CEA: CEA1: 4.1 ng/mL (ref 0.0–4.7)

## 2018-02-26 ENCOUNTER — Other Ambulatory Visit: Payer: Self-pay | Admitting: Oncology

## 2018-03-02 ENCOUNTER — Inpatient Hospital Stay: Payer: Medicare Other

## 2018-03-02 ENCOUNTER — Inpatient Hospital Stay: Payer: Medicare Other | Admitting: Oncology

## 2018-03-02 VITALS — BP 123/81 | HR 71 | Temp 97.9°F | Resp 18 | Wt 202.0 lb

## 2018-03-02 DIAGNOSIS — Z5111 Encounter for antineoplastic chemotherapy: Secondary | ICD-10-CM

## 2018-03-02 DIAGNOSIS — C155 Malignant neoplasm of lower third of esophagus: Secondary | ICD-10-CM | POA: Diagnosis not present

## 2018-03-02 LAB — CBC WITH DIFFERENTIAL/PLATELET
Abs Immature Granulocytes: 0.23 10*3/uL — ABNORMAL HIGH (ref 0.00–0.07)
BASOS PCT: 1 %
Basophils Absolute: 0.1 10*3/uL (ref 0.0–0.1)
EOS ABS: 0.1 10*3/uL (ref 0.0–0.5)
EOS PCT: 3 %
HEMATOCRIT: 38 % (ref 36.0–46.0)
Hemoglobin: 12.3 g/dL (ref 12.0–15.0)
Immature Granulocytes: 5 %
Lymphocytes Relative: 15 %
Lymphs Abs: 0.7 10*3/uL (ref 0.7–4.0)
MCH: 32.7 pg (ref 26.0–34.0)
MCHC: 32.4 g/dL (ref 30.0–36.0)
MCV: 101.1 fL — AB (ref 80.0–100.0)
MONOS PCT: 5 %
Monocytes Absolute: 0.2 10*3/uL (ref 0.1–1.0)
NEUTROS ABS: 3.4 10*3/uL (ref 1.7–7.7)
NRBC: 0.6 % — AB (ref 0.0–0.2)
Neutrophils Relative %: 71 %
PLATELETS: 200 10*3/uL (ref 150–400)
RBC: 3.76 MIL/uL — ABNORMAL LOW (ref 3.87–5.11)
RDW: 14.4 % (ref 11.5–15.5)
WBC: 4.8 10*3/uL (ref 4.0–10.5)

## 2018-03-02 LAB — COMPREHENSIVE METABOLIC PANEL
ALBUMIN: 3.8 g/dL (ref 3.5–5.0)
ALK PHOS: 61 U/L (ref 38–126)
ALT: 21 U/L (ref 0–44)
AST: 34 U/L (ref 15–41)
Anion gap: 8 (ref 5–15)
BUN: 14 mg/dL (ref 8–23)
CALCIUM: 9.3 mg/dL (ref 8.9–10.3)
CHLORIDE: 106 mmol/L (ref 98–111)
CO2: 30 mmol/L (ref 22–32)
CREATININE: 0.65 mg/dL (ref 0.44–1.00)
GFR calc non Af Amer: >60 mL/min (ref >60)
GLUCOSE: 174 mg/dL — AB (ref 70–99)
Potassium: 3.5 mmol/L (ref 3.5–5.1)
SODIUM: 144 mmol/L (ref 135–145)
Total Bilirubin: 0.7 mg/dL (ref 0.3–1.2)
Total Protein: 6.8 g/dL (ref 6.5–8.1)

## 2018-03-02 MED ORDER — SODIUM CHLORIDE 0.9 % IV SOLN
Freq: Once | INTRAVENOUS | Status: AC
  Administered 2018-03-02: 10:00:00 via INTRAVENOUS
  Filled 2018-03-02: qty 250

## 2018-03-02 MED ORDER — DIPHENHYDRAMINE HCL 50 MG/ML IJ SOLN
50.0000 mg | Freq: Once | INTRAMUSCULAR | Status: AC
  Administered 2018-03-02: 50 mg via INTRAVENOUS
  Filled 2018-03-02: qty 1

## 2018-03-02 MED ORDER — SODIUM CHLORIDE 0.9 % IV SOLN
80.0000 mg/m2 | Freq: Once | INTRAVENOUS | Status: AC
  Administered 2018-03-02: 162 mg via INTRAVENOUS
  Filled 2018-03-02: qty 27

## 2018-03-02 MED ORDER — FAMOTIDINE IN NACL 20-0.9 MG/50ML-% IV SOLN
20.0000 mg | Freq: Once | INTRAVENOUS | Status: AC
  Administered 2018-03-02: 20 mg via INTRAVENOUS
  Filled 2018-03-02: qty 50

## 2018-03-02 MED ORDER — HEPARIN SOD (PORK) LOCK FLUSH 100 UNIT/ML IV SOLN
500.0000 [IU] | Freq: Once | INTRAVENOUS | Status: AC | PRN
  Administered 2018-03-02: 500 [IU]
  Filled 2018-03-02: qty 5

## 2018-03-02 MED ORDER — SODIUM CHLORIDE 0.9% FLUSH
10.0000 mL | INTRAVENOUS | Status: DC | PRN
  Filled 2018-03-02: qty 10

## 2018-03-02 MED ORDER — DEXAMETHASONE SODIUM PHOSPHATE 10 MG/ML IJ SOLN
10.0000 mg | Freq: Once | INTRAMUSCULAR | Status: AC
  Administered 2018-03-02: 10 mg via INTRAVENOUS
  Filled 2018-03-02: qty 1

## 2018-03-03 LAB — CEA: CEA: 4.3 ng/mL (ref 0.0–4.7)

## 2018-03-04 ENCOUNTER — Encounter: Payer: Self-pay | Admitting: Family Medicine

## 2018-03-04 ENCOUNTER — Ambulatory Visit (INDEPENDENT_AMBULATORY_CARE_PROVIDER_SITE_OTHER): Payer: Medicare Other | Admitting: Family Medicine

## 2018-03-04 DIAGNOSIS — C159 Malignant neoplasm of esophagus, unspecified: Secondary | ICD-10-CM | POA: Diagnosis not present

## 2018-03-04 DIAGNOSIS — J0100 Acute maxillary sinusitis, unspecified: Secondary | ICD-10-CM

## 2018-03-04 DIAGNOSIS — R32 Unspecified urinary incontinence: Secondary | ICD-10-CM

## 2018-03-04 MED ORDER — AMOXICILLIN-POT CLAVULANATE 875-125 MG PO TABS: 1.0000 | ORAL_TABLET | Freq: Two times a day (BID) | ORAL | 0 refills | Status: DC

## 2018-03-04 NOTE — Progress Notes (Signed)
History of esophageal cancer.  Her hair has fallen out and she got a new wig.  She had has mult endoscopies and her dysphagia is better now.  She is still on chemo with abnormal PET noted.  D/w pt.    Her appetite is still lower than normal.  She has seen nutrition in the meantime.  D/w pt about trying to limit weight loss.    "I'm trying to make the best of it."  She is putting up with fatigue.    Sinus pain.  Going on for weeks.  H/o similar in the past, usually in the fall of the year.  Intermittent R maxillary pain.  Rhinorrhea, discolored some of the time, can be bloody.  No fevers.  No vomiting.  No cough, no sputum.  Already using flonse.    She is taking oxybutynin for urinary incontinence with a little relief  She was asking about other options.  She leaks with cough/laugh/sneeze.  She has urinary urgency.   Meds, vitals, and allergies reviewed.   ROS: Per HPI unless specifically indicated in ROS section   GEN: nad, alert and oriented HEENT: mucous membranes moist, tm w/o erythema, nasal exam w/o erythema, discolored discharge noted,  OP with cobblestoning, right maxillary sinus tender to palpation.  Sinuses not tender to palpation otherwise NECK: supple w/o LA CV: rrr.   PULM: ctab, no inc wob EXT: no edema SKIN: no acute rash

## 2018-03-04 NOTE — Patient Instructions (Addendum)
Start augmentin, keep using flonase and update me as needed.  Start doing kegel exercises in the meantime.   Take care.  Glad to see you.

## 2018-03-05 DIAGNOSIS — R32 Unspecified urinary incontinence: Secondary | ICD-10-CM | POA: Insufficient documentation

## 2018-03-05 DIAGNOSIS — J019 Acute sinusitis, unspecified: Secondary | ICD-10-CM | POA: Insufficient documentation

## 2018-03-05 NOTE — Assessment & Plan Note (Signed)
Start Augmentin.  She should be able to tolerate that.  Routine cautions given.  Update me as needed.  Still okay for outpatient follow-up.  Lungs are clear.

## 2018-03-05 NOTE — Assessment & Plan Note (Signed)
Per oncology and GI.  I appreciate the help of all involved.

## 2018-03-05 NOTE — Assessment & Plan Note (Signed)
Not a new issue.  Likely a mixed picture with some stress symptoms and some overactive/overflow symptoms.  Oxybutynin helped but she was asking about other options.  Discussed with patient about starting Kegel exercises in the meantime.  I would like to consider medication options and I will get back with the patient.  She agrees with plan.

## 2018-03-09 ENCOUNTER — Other Ambulatory Visit: Payer: Self-pay

## 2018-03-09 ENCOUNTER — Inpatient Hospital Stay: Payer: Medicare Other

## 2018-03-09 ENCOUNTER — Inpatient Hospital Stay (HOSPITAL_BASED_OUTPATIENT_CLINIC_OR_DEPARTMENT_OTHER): Payer: Medicare Other | Admitting: Oncology

## 2018-03-09 ENCOUNTER — Encounter: Payer: Self-pay | Admitting: Oncology

## 2018-03-09 VITALS — BP 119/64 | HR 66 | Temp 97.0°F | Resp 18 | Wt 204.7 lb

## 2018-03-09 DIAGNOSIS — E785 Hyperlipidemia, unspecified: Secondary | ICD-10-CM

## 2018-03-09 DIAGNOSIS — C787 Secondary malignant neoplasm of liver and intrahepatic bile duct: Secondary | ICD-10-CM

## 2018-03-09 DIAGNOSIS — Z803 Family history of malignant neoplasm of breast: Secondary | ICD-10-CM

## 2018-03-09 DIAGNOSIS — C155 Malignant neoplasm of lower third of esophagus: Secondary | ICD-10-CM

## 2018-03-09 DIAGNOSIS — G629 Polyneuropathy, unspecified: Secondary | ICD-10-CM

## 2018-03-09 DIAGNOSIS — K222 Esophageal obstruction: Secondary | ICD-10-CM

## 2018-03-09 DIAGNOSIS — R5382 Chronic fatigue, unspecified: Secondary | ICD-10-CM

## 2018-03-09 DIAGNOSIS — K219 Gastro-esophageal reflux disease without esophagitis: Secondary | ICD-10-CM

## 2018-03-09 DIAGNOSIS — Z79899 Other long term (current) drug therapy: Secondary | ICD-10-CM

## 2018-03-09 DIAGNOSIS — Z5111 Encounter for antineoplastic chemotherapy: Secondary | ICD-10-CM

## 2018-03-09 DIAGNOSIS — J449 Chronic obstructive pulmonary disease, unspecified: Secondary | ICD-10-CM

## 2018-03-09 DIAGNOSIS — Z87442 Personal history of urinary calculi: Secondary | ICD-10-CM

## 2018-03-09 DIAGNOSIS — G4733 Obstructive sleep apnea (adult) (pediatric): Secondary | ICD-10-CM

## 2018-03-09 DIAGNOSIS — E876 Hypokalemia: Secondary | ICD-10-CM

## 2018-03-09 DIAGNOSIS — Z9989 Dependence on other enabling machines and devices: Secondary | ICD-10-CM

## 2018-03-09 DIAGNOSIS — M1990 Unspecified osteoarthritis, unspecified site: Secondary | ICD-10-CM

## 2018-03-09 DIAGNOSIS — I10 Essential (primary) hypertension: Secondary | ICD-10-CM

## 2018-03-09 DIAGNOSIS — Z85820 Personal history of malignant melanoma of skin: Secondary | ICD-10-CM

## 2018-03-09 DIAGNOSIS — R5381 Other malaise: Secondary | ICD-10-CM

## 2018-03-09 DIAGNOSIS — Z87891 Personal history of nicotine dependence: Secondary | ICD-10-CM

## 2018-03-09 LAB — COMPREHENSIVE METABOLIC PANEL
ALBUMIN: 3.6 g/dL (ref 3.5–5.0)
ALK PHOS: 57 U/L (ref 38–126)
ALT: 20 U/L (ref 0–44)
ANION GAP: 6 (ref 5–15)
AST: 31 U/L (ref 15–41)
BUN: 12 mg/dL (ref 8–23)
CO2: 28 mmol/L (ref 22–32)
Calcium: 9.1 mg/dL (ref 8.9–10.3)
Chloride: 108 mmol/L (ref 98–111)
Creatinine, Ser: 0.69 mg/dL (ref 0.44–1.00)
GFR calc non Af Amer: >60 mL/min (ref >60)
GLUCOSE: 105 mg/dL — AB (ref 70–99)
Potassium: 3.1 mmol/L — ABNORMAL LOW (ref 3.5–5.1)
Sodium: 142 mmol/L (ref 135–145)
Total Bilirubin: 0.5 mg/dL (ref 0.3–1.2)
Total Protein: 6.7 g/dL (ref 6.5–8.1)

## 2018-03-09 LAB — URINALYSIS, DIPSTICK ONLY
BILIRUBIN URINE: NEGATIVE
Glucose, UA: NEGATIVE mg/dL
HGB URINE DIPSTICK: NEGATIVE
KETONES UR: NEGATIVE mg/dL
NITRITE: NEGATIVE
Protein, ur: NEGATIVE mg/dL
SPECIFIC GRAVITY, URINE: 1.01 (ref 1.005–1.030)
pH: 5.5 (ref 5.0–8.0)

## 2018-03-09 LAB — CBC WITH DIFFERENTIAL/PLATELET
ABS IMMATURE GRANULOCYTES: 0.05 10*3/uL (ref 0.00–0.07)
BASOS ABS: 0 10*3/uL (ref 0.0–0.1)
Basophils Relative: 1 %
Eosinophils Absolute: 0.1 10*3/uL (ref 0.0–0.5)
Eosinophils Relative: 3 %
HEMATOCRIT: 36.1 % (ref 36.0–46.0)
Hemoglobin: 11.6 g/dL — ABNORMAL LOW (ref 12.0–15.0)
IMMATURE GRANULOCYTES: 2 %
LYMPHS ABS: 0.5 10*3/uL — AB (ref 0.7–4.0)
Lymphocytes Relative: 16 %
MCH: 32.7 pg (ref 26.0–34.0)
MCHC: 32.1 g/dL (ref 30.0–36.0)
MCV: 101.7 fL — AB (ref 80.0–100.0)
MONOS PCT: 7 %
Monocytes Absolute: 0.2 10*3/uL (ref 0.1–1.0)
NEUTROS ABS: 2.3 10*3/uL (ref 1.7–7.7)
NEUTROS PCT: 71 %
NRBC: 0 % (ref 0.0–0.2)
PLATELETS: 202 10*3/uL (ref 150–400)
RBC: 3.55 MIL/uL — ABNORMAL LOW (ref 3.87–5.11)
RDW: 14.5 % (ref 11.5–15.5)
WBC: 3.3 10*3/uL — ABNORMAL LOW (ref 4.0–10.5)

## 2018-03-09 MED ORDER — HEPARIN SOD (PORK) LOCK FLUSH 100 UNIT/ML IV SOLN
500.0000 [IU] | Freq: Once | INTRAVENOUS | Status: AC
  Administered 2018-03-09: 500 [IU] via INTRAVENOUS
  Filled 2018-03-09: qty 5

## 2018-03-09 MED ORDER — POTASSIUM CHLORIDE 20 MEQ PO PACK: 20.0000 meq | PACK | Freq: Every day | ORAL | 1 refills | Status: DC

## 2018-03-09 MED ORDER — SODIUM CHLORIDE 0.9 % IV SOLN
65.0000 mg/m2 | Freq: Once | INTRAVENOUS | Status: AC
  Administered 2018-03-09: 132 mg via INTRAVENOUS
  Filled 2018-03-09: qty 22

## 2018-03-09 MED ORDER — SODIUM CHLORIDE 0.9 % IV SOLN
Freq: Once | INTRAVENOUS | Status: AC
  Administered 2018-03-09: 11:00:00 via INTRAVENOUS
  Filled 2018-03-09: qty 250

## 2018-03-09 MED ORDER — SODIUM CHLORIDE 0.9 % IV SOLN
Freq: Once | INTRAVENOUS | Status: AC
  Administered 2018-03-09: 13:00:00 via INTRAVENOUS
  Filled 2018-03-09: qty 100

## 2018-03-09 MED ORDER — DEXAMETHASONE SODIUM PHOSPHATE 10 MG/ML IJ SOLN
10.0000 mg | Freq: Once | INTRAMUSCULAR | Status: AC
  Administered 2018-03-09: 10 mg via INTRAVENOUS
  Filled 2018-03-09: qty 1

## 2018-03-09 MED ORDER — FAMOTIDINE IN NACL 20-0.9 MG/50ML-% IV SOLN
20.0000 mg | Freq: Once | INTRAVENOUS | Status: AC
  Administered 2018-03-09: 20 mg via INTRAVENOUS
  Filled 2018-03-09: qty 50

## 2018-03-09 MED ORDER — DIPHENHYDRAMINE HCL 50 MG/ML IJ SOLN
50.0000 mg | Freq: Once | INTRAMUSCULAR | Status: AC
  Administered 2018-03-09: 50 mg via INTRAVENOUS
  Filled 2018-03-09: qty 1

## 2018-03-09 MED ORDER — HEPARIN SOD (PORK) LOCK FLUSH 100 UNIT/ML IV SOLN
INTRAVENOUS | Status: AC
  Filled 2018-03-09: qty 5

## 2018-03-09 MED ORDER — SODIUM CHLORIDE 0.9 % IV SOLN
8.0000 mg/kg | Freq: Once | INTRAVENOUS | Status: AC
  Administered 2018-03-09: 700 mg via INTRAVENOUS
  Filled 2018-03-09: qty 50

## 2018-03-09 MED ORDER — POTASSIUM CHLORIDE 10 MEQ/100ML IV SOLN
10.0000 meq | Freq: Once | INTRAVENOUS | Status: DC
  Filled 2018-03-09: qty 100

## 2018-03-09 MED ORDER — SODIUM CHLORIDE 0.9% FLUSH
10.0000 mL | INTRAVENOUS | Status: DC | PRN
  Administered 2018-03-09: 10 mL via INTRAVENOUS
  Filled 2018-03-09: qty 10

## 2018-03-09 MED ORDER — ACETAMINOPHEN 325 MG PO TABS
650.0000 mg | ORAL_TABLET | Freq: Once | ORAL | Status: AC
  Administered 2018-03-09: 650 mg via ORAL
  Filled 2018-03-09: qty 2

## 2018-03-09 NOTE — Progress Notes (Signed)
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  Patient receiving chemotherapy, followed by Dr. Tasia Catchings.  Met with patient during infusion.  Sister with patient today.  Patient sleepy during visit due to benadryl.  Unable to tell RD what she has been eating.  Reports she is drinking shakes but not sure what type.  Reports has a hard time swallow meats.    Medications: reviewed  Labs: K 3.1, glucose 105  Anthropometrics:   Weight stable at 204 lb  11.2 oz today.     NUTRITION DIAGNOSIS: Inadequate oral intake   INTERVENTION:  Wrote down examples of high calorie shakes to include to maintain weight Encouraged chopping meats well and adding gravy to improve swallowing.      MONITORING, EVALUATION, GOAL: weight trends, intake   NEXT VISIT: as needed  Amarisa Wilinski B. Zenia Resides, [place], [place] Registered Dietitian [phone] (pager)

## 2018-03-09 NOTE — Progress Notes (Signed)
Hematology/Oncology Follow Up Note [facility]  [phone] [phone]  Patient Care Team: Tonia Ghent, MD as PCP - General (Family Medicine)   Name of the patient: [name]  [id number]  [birth year]   REASON FOR VISIT Follow up of chemotherapy management of esophageal adenocarcinoma  HISTORY OF PRESENT ILLNESS Patient is a 83 year old female who has metastatic esophageal cancer with liver involvement.  Weight loss 20 pounds for the past year prior to diagnosis.  # EGD during admission showed partially obstructive esophageal mass at the distal part of esophagus. Biopsy was taken. Pathology showed esophageal adenocarcinoma. #US biopsy of liver lesion to proof distant metastasis.  Report family history of breast cancer, but she has been having routine mammograms.  # Lives alone. She's a former smoker.  # Molecular Testing:  #[employer] Cdx: No reportable alternations with companion diagnostic (CDx) claims.  MS stable Tumor mutation burden: Cannot be determined, CCND3 amplification: Equivocal.  Amended reports on 06/04/2017, Tumor mutational burden changed from can not be determined to "TMB -low" on the re-analyzed samples.  # HER 2 negative.   Current Treatment S/p 6 cycles of FOLFOX, PET scan showed excellent partial response from both chemotherapy and radiation, result was discussed with patient.  currently on 5-FU maintenance.  S/p palliative radiation in January 2019 # Developed esophageal stricture secondary to radiation in April and got dilation via endoscopy. EGD shoed Benign-appearing esophageal stenosis. This is a result of radiation therapy.- LA Grade C radiation esophagitis. - The previously noted esophageal mass in Nov 2018 was much decreased indicating good response to radiation/chemotherapy. - Normal stomach.- Normal examined jejunum.- No specimens collected.    INTERVAL HISTORY  Patient with above history presents  for assessment prior to second line chemotherapy for treatment of metastatic esophageal cancer.   # Chemotherapy, reports tolerates well. No nausea, vomiting, diarrhea.  # Neuropathy is chronic at baseline.  # Recent Sinusitis. Felt sinus pressure. pcp started patient Augmentin BID x 10 days, feels better. No fever.  # Dysphagia, reports no swallowing difficulty.  # Weight loss: weight has been stable during the interval. Appetite is fair.   Review of systems Review of Systems  Constitutional: Positive for malaise/fatigue. Negative for chills, diaphoresis, fever and weight loss.  HENT: Negative for ear discharge, ear pain, hearing loss, nosebleeds and sore throat.        Mouth sore resolved.  Eyes: Negative for double vision, photophobia, pain, discharge and redness.  Respiratory: Negative for cough, hemoptysis, shortness of breath and wheezing.   Cardiovascular: Negative for chest pain, palpitations, orthopnea, claudication and leg swelling.  Gastrointestinal: Negative for abdominal pain, blood in stool, constipation, diarrhea, heartburn, melena, nausea and vomiting.       Dysphagia is better  Genitourinary: Negative for dysuria, flank pain, frequency, hematuria and urgency.  Musculoskeletal: Negative for back pain, joint pain, myalgias and neck pain.  Skin: Negative for itching and rash.  Neurological: Positive for tingling and sensory change. Negative for dizziness, tremors, focal weakness, weakness and headaches.  Endo/Heme/Allergies: Negative for environmental allergies. Does not bruise/bleed easily.  Psychiatric/Behavioral: Negative for depression, hallucinations, substance abuse and suicidal ideas. The patient is not nervous/anxious.     Allergies  Allergen Reactions  . Cefuroxime Nausea Only  . Codeine Nausea Only  . Doxycycline Other (See Comments)    Lip swelling  . Gabapentin Other (See Comments)    Swelling.   . Iohexol Itching    07-15-13 pt developed itching on fingers  after contrast given. Dr. Irish Elders looked at pt and said to put in system as allergy. BB  . Other Other (See Comments)    Contrast Dye- caused fever, chills, awful feeling Bandages - itching, rash  . Oxycodone Other (See Comments)    nausea  . Quinolones Swelling    Lip swelling  . Synvisc [Hylan G-F 20] Swelling     Past Medical History:  Diagnosis Date  . Arthritis   . Asthma   . COPD (chronic obstructive pulmonary disease) ([place])   . Dysphonia   . Dyspnea   . Esophageal cancer ([place])   . GERD (gastroesophageal reflux disease)   . Heart murmur   . History of kidney stones   . Hypertension   . Hypokalemia 10/07/2017  . Melanoma ([place])   . OAB (overactive bladder)   . OSA on CPAP   . 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 Personal history of chemotherapy    esophageal cancer  . Personal history of radiation therapy    esophageal cancer  . Pupil asymmetry    R pupil defect  . Skin cancer   . Sleep apnea   . Spinal cord abscess      Past Surgical History:  Procedure Laterality Date  . ABDOMINAL HYSTERECTOMY    . ANTERIOR CERVICAL DECOMP/DISCECTOMY FUSION N/A 07/16/2013   Procedure: ANTERIOR CERVICAL DECOMPRESSION/DISCECTOMY FUSION mutlipleLEVELS C4-7;  Surgeon: Erline Levine, MD;  Location: [place] [facility]S;  Service: Neurosurgery;  Laterality: N/A;  . APPENDECTOMY    . carpel tunn Bilateral   . CATARACT EXTRACTION    . CATARACT EXTRACTION, BILATERAL    . CHOLECYSTECTOMY    . dental implant    . ESOPHAGOGASTRODUODENOSCOPY Left 03/21/2017   Procedure: ESOPHAGOGASTRODUODENOSCOPY (EGD);  Surgeon: Virgel Manifold, MD;  Location[facility] END[facility]COPY;  Service: Endoscopy;  Laterality: Left;  . ESOPHAGOGASTRODUODENOSCOPY (EGD) WITH PROPOFOL N/A 08/20/2017   Procedure: ESOPHAGOGASTRODUODENOSCOPY (EGD) WITH PROPOFOL;  Surgeon: Virgel Manifold, MD;  Location: [facility] ENDOSCOPY;  Service: Endoscopy;  Laterality: N/A;  . ESOPHAGOGASTRODUODENOSCOPY (EGD) WITH PROPOFOL N/A  09/19/2017   Procedure: ESOPHAGOGASTRODUODENOSCOPY (EGD) WITH PROPOFOL;  Surgeon: Virgel Manifold, MD;  Location: [facility] ENDOSCOPY;  Service: Endoscopy;  Laterality: N/A;  . ESOPHAGOGASTRODUODENOSCOPY (EGD) WITH PROPOFOL N/A 10/03/2017   Procedure: ESOPHAGOGASTRODUODENOSCOPY (EGD) WITH PROPOFOL;  Surgeon: Virgel Manifold, MD;  Location: [facility] ENDOSCOPY;  Service: Endoscopy;  Laterality: N/A;  . ESOPHAGOGASTRODUODENOSCOPY (EGD) WITH PROPOFOL N/A 10/22/2017   Procedure: ESOPHAGOGASTRODUODENOSCOPY (EGD) WITH PROPOFOL;  Surgeon: Virgel Manifold, MD;  Location: [facility] ENDOSCOPY;  Service: Endoscopy;  Laterality: N/A;  . ESOPHAGOGASTRODUODENOSCOPY (EGD) WITH PROPOFOL N/A 11/18/2017   Procedure: ESOPHAGOGASTRODUODENOSCOPY (EGD) WITH PROPOFOL;  Surgeon: Virgel Manifold, MD;  Location: [facility] ENDOSCOPY;  Service: Endoscopy;  Laterality: N/A;  . ESOPHAGOGASTRODUODENOSCOPY (EGD) WITH PROPOFOL N/A 12/17/2017   Procedure: ESOPHAGOGASTRODUODENOSCOPY (EGD) WITH PROPOFOL;  Surgeon: Virgel Manifold, MD;  Location: [facility] ENDOSCOPY;  Service: Endoscopy;  Laterality: N/A;  . ESOPHAGOGASTRODUODENOSCOPY (EGD) WITH PROPOFOL N/A 01/08/2018   Procedure: ESOPHAGOGASTRODUODENOSCOPY (EGD) WITH PROPOFOL;  Surgeon: Virgel Manifold, MD;  Location: [facility] ENDOSCOPY;  Service: Endoscopy;  Laterality: N/A;  . ESOPHAGOGASTRODUODENOSCOPY (EGD) WITH PROPOFOL N/A 01/22/2018   Procedure: ESOPHAGOGASTRODUODENOSCOPY (EGD) WITH PROPOFOL;  Surgeon: Virgel Manifold, MD;  Location: [facility] ENDOSCOPY;  Service: Endoscopy;  Laterality: N/A;  . EYE SURGERY    . GASTRIC BYPASS    . HERNIA REPAIR    . HIATAL HERNIA REPAIR    . JOINT REPLACEMENT    .  MELANOMA EXCISION    . PORTA CATH INSERTION N/A 04/07/2017   Procedure: PORTA CATH INSERTION;  Surgeon: Algernon Huxley, MD;  Location: [place] CV LAB;  Service: Cardiovascular;  Laterality: N/A;  . POSTERIOR CERVICAL FUSION/FORAMINOTOMY N/A 07/28/2013   Procedure: Cervical  four-seven  posterior cervical fusion;  Surgeon: Erline Levine, MD;  Location: [place] NEURO ORS;  Service: Neurosurgery;  Laterality: N/A;  . TONSILLECTOMY    . TOTAL KNEE ARTHROPLASTY      Social History   Socioeconomic History  . Marital status: Widowed    Spouse name: Not on file  . Number of children: 2  . Years of education: Not on file  . Highest education level: Master's degree (e.g., MA, MS, MEng, MEd, MSW, MBA)  Occupational History  . Not on file  Social Needs  . Financial resource strain: Not hard at all  . Food insecurity:    Worry: Never true    Inability: Never true  . Transportation needs:    Medical: No    Non-medical: No  Tobacco Use  . Smoking status: Former Smoker    Packs/day: 1.00    Years: 20.00    Pack years: 20.00    Types: Cigarettes    Last attempt to quit: 1977    Years since quitting: 42.8  . Smokeless tobacco: Never Used  Substance and Sexual Activity  . Alcohol use: No  . Drug use: Never  . Sexual activity: Never  Lifestyle  . Physical activity:    Days per week: 2 days    Minutes per session: 30 min  . Stress: Not at all  Relationships  . Social connections:    Talks on phone: More than three times a week    Gets together: More than three times a week    Attends religious service: More than 4 times per year    Active member of club or organization: Yes    Attends meetings of clubs or organizations: More than 4 times per year    Relationship status: Widowed  . Intimate partner violence:    Fear of current or ex partner: No    Emotionally abused: No    Physically abused: No    Forced sexual activity: No  Other Topics Concern  . Not on file  Social History Narrative   Marital Status- Widowed    Lives by herself    Employement- Retired Pharmacist, hospital   Exercise hx- Does PT     Family History  Problem Relation Age of Onset  . Breast cancer Mother 38  . Arthritis-Osteo Mother   . Breast cancer Sister 70  . Bladder Cancer Sister   .  Bladder Cancer Father   . Tongue cancer Father   . Congenital heart disease Father   . Prostate cancer Brother   . Uterine cancer Maternal Aunt   . Lung cancer Paternal Uncle   . Leukemia Maternal Grandmother   . Liver cancer Paternal Grandmother   . Colon cancer Neg Hx      Current Outpatient Medications:  .  acetaminophen (TYLENOL) 325 MG tablet, Take 650 mg by mouth every 6 (six) hours as needed for moderate pain or headache. , Disp: , Rfl:  .  ALBUTEROL SULFATE HFA IN, Inhale into the lungs. , Disp: , Rfl:  .  amoxicillin-clavulanate (AUGMENTIN) 875-125 MG tablet, Take 1 tablet by mouth 2 (two) times daily., Disp: 20 tablet, Rfl: 0 .  bisacodyl (DULCOLAX) 10 MG suppository, Place rectally., Disp: , Rfl:  .  calcium carbonate (TUMS EX) 750 MG chewable tablet, Chew by mouth., Disp: , Rfl:  .  CARAFATE 1 GM/10ML suspension, TAKE 10MLS BY MOUTH FOUR TIMES DAILY, Disp: 420 mL, Rfl: 1 .  Cholecalciferol (VITAMIN D) 2000 UNITS CAPS, Take 2,000 Units by mouth daily., Disp: , Rfl:  .  cyanocobalamin 500 MCG tablet, Take 500 mcg every other day by mouth., Disp: , Rfl:  .  Diclofenac Sodium 1 % CREA, Apply to left arm as needed for pain three times daily, Disp: 120 g, Rfl: 0 .  dronabinol (MARINOL) 5 MG capsule, Take 1 capsule (5 mg total) by mouth 2 (two) times daily before a meal., Disp: 60 capsule, Rfl: 0 .  fluticasone (FLONASE) 50 MCG/ACT nasal spray, Place 1-2 sprays into both nostrils daily., Disp: 16 g, Rfl: 5 .  furosemide (LASIX) 20 MG tablet, Take 1 tablet (20 mg total) by mouth daily., Disp: 90 tablet, Rfl: 3 .  hydrocortisone cream 1 %, Apply 1 application topically daily as needed for itching. , Disp: , Rfl:  .  loratadine (CLARITIN) 10 MG tablet, Take 10 mg by mouth daily., Disp: , Rfl:  .  LYRICA 150 MG capsule, TAKE 1 CAPSULE BY MOUTH TWO TIMES DAILY, Disp: 180 capsule, Rfl: 1 .  metoprolol succinate (TOPROL-XL) 50 MG 24 hr tablet, TAKE 1 TABLET BY MOUTH DAILY, Disp: 90 tablet,  Rfl: 1 .  Multiple Vitamins-Minerals (MULTIVITAMIN ADULT) CHEW, Chew by mouth daily., Disp: , Rfl:  .  oxybutynin (DITROPAN) 5 MG tablet, TAKE ONE TABLET BY MOUTH EVERY MORNING AND TAKE ONE-HALF TABLET AT BEDTIME, Disp: 135 tablet, Rfl: 0 .  Polyethyl Glycol-Propyl Glycol (SYSTANE OP), Apply 1 drop to eye daily as needed (dry eyes)., Disp: , Rfl:  .  potassium chloride (KLOR-CON) 20 MEQ packet, Take 20 mEq by mouth as needed (Daily as needed with fluid pill)., Disp: 30 packet, Rfl: 1 .  pyridOXINE (VITAMIN B-6) 100 MG tablet, Take 1 tablet (100 mg total) by mouth daily., Disp: 30 tablet, Rfl: 3 .  senna-docusate (SENOKOT-S) 8.6-50 MG tablet, Take 1 tablet at bedtime as needed by mouth for mild constipation., Disp: , Rfl:  .  traMADol (ULTRAM) 50 MG tablet, Take 1 tablet (50 mg total) by mouth every 6 (six) hours as needed., Disp: 30 tablet, Rfl: 0 .  traZODone (DESYREL) 50 MG tablet, TAKE 1/2 TABLET BY MOUTH AT BEDTIME IF NEEDED FOR SLEEP, Disp: 45 tablet, Rfl: 3 .  pantoprazole (PROTONIX) 40 MG tablet, Take 1 tablet (40 mg total) by mouth 2 (two) times daily before a meal., Disp: 60 tablet, Rfl: 1 .  ranitidine (ZANTAC) 150 MG/10ML syrup, Take 10 mLs (150 mg total) by mouth at bedtime., Disp: 300 mL, Rfl: 3 No current facility-administered medications for this visit.   Facility-Administered Medications Ordered in Other Visits:  .  sodium chloride flush (NS) 0.9 % injection 10 mL, 10 mL, Intravenous, PRN, Earlie Server, MD, 10 mL at 01/26/18 0904  Physical exam:  Vitals:   03/09/18 0921  BP: 119/64  Pulse: 66  Resp: 18  Temp: (!) 97 F (36.1 C)  TempSrc: Tympanic  SpO2: 96%  Weight: 204 lb 11.2 oz (92.9 kg)  ECOG 1 Physical Exam  Constitutional: She is oriented to person, place, and time. No distress.  HENT:  Head: Normocephalic and atraumatic.  Nose: Nose normal.  Mouth/Throat: Oropharynx is clear and moist. No oropharyngeal exudate.  Eyes: Pupils are equal, round, and reactive to  light. Conjunctivae and EOM are normal.  Left eye exhibits no discharge. No scleral icterus.  Neck: Normal range of motion. Neck supple. No JVD present.  Cardiovascular: Normal rate and regular rhythm.  Murmur heard. Pulmonary/Chest: Effort normal and breath sounds normal. No respiratory distress. She has no wheezes. She has no rales. She exhibits no tenderness.  Abdominal: Soft. Bowel sounds are normal. She exhibits no distension and no mass. There is no tenderness. There is no rebound and no guarding.  Musculoskeletal: Normal range of motion. She exhibits no edema, tenderness or deformity.  Lymphadenopathy:    She has no cervical adenopathy.  Neurological: She is alert and oriented to person, place, and time. No cranial nerve deficit. She exhibits normal muscle tone. Coordination normal.  Skin: Skin is warm and dry. She is not diaphoretic. No erythema.  Psychiatric: Affect and judgment normal.    CMP Latest Ref Rng & Units 03/09/2018  Glucose 70 - 99 mg/dL 105(H)  BUN 8 - 23 mg/dL 12  Creatinine 0.44 - 1.00 mg/dL 0.69  Sodium 135 - 145 mmol/L 142  Potassium 3.5 - 5.1 mmol/L 3.1(L)  Chloride 98 - 111 mmol/L 108  CO2 22 - 32 mmol/L 28  Calcium 8.9 - 10.3 mg/dL 9.1  Total Protein 6.5 - 8.1 g/dL 6.7  Total Bilirubin 0.3 - 1.2 mg/dL 0.5  Alkaline Phos 38 - 126 U/L 57  AST 15 - 41 U/L 31  ALT 0 - 44 U/L 20   CBC Latest Ref Rng & Units 03/09/2018  WBC 4.0 - 10.5 K/uL 3.3(L)  Hemoglobin 12.0 - 15.0 g/dL 11.6(L)  Hematocrit 36.0 - 46.0 % 36.1  Platelets 150 - 400 K/uL 202    RADIOGRAPHIC STUDIES: I have personally reviewed the radiological images as listed and agreed with the findings in the report. PET scan 01/23/2018  Interval development of loculated ascites within the upper abdomen which overlies the liver and spleen and exhibits increased radiotracer uptake. Findings worrisome for peritoneal carcinomatosis. Consider further evaluation with diagnostic paracentesis. 2. Soft tissue  infiltration within the omentum with increased radiotracer uptake noted. Also worrisome for peritoneal carcinomatosis. 3. New hypermetabolic internal mammary lymph nodes and right CP angle lymph node. Suspicious for metastatic adenopathy. 4. Mild FDG uptake is identified localizing to the lower third of the esophagus within SUV max of 5.15.  Assessment and plan  Cancer Staging Malignant neoplasm of lower third of esophagus [facility]) Staging form: Esophagus - Adenocarcinoma, AJCC 8th Edition - Clinical stage from 04/10/2017: Stage IVB (cTX, cNX, pM1) - Signed by Earlie Server, MD on 04/10/2017  1. Malignant neoplasm of lower third of esophagus (HCC)   2. Encounter for antineoplastic chemotherapy   3. Neuropathy   4. Hypokalemia    #Esophageal cancer, progressed.  Currently on second line treatment with Cyramza and paclitaxel.   Tolerates well. Labs are reviewed and discussed with patient.  Acceptable counts to proceed with paclitaxel and Cyramza.   #  Chronic neuropathy: continue Lyrica  Stable symptoms...  # Sinusitis, continue Augmentin. I have reduced today's Paclitaxel dose given that she is still on antibiotics. If she feels sinus symptoms not fully resolved after finishing current course. Can consider extend. Patient will call and let me know.   # Hypokalemia, proceed with IV potassium chlorid 108mq x 1. Advise patient to take potassium 269m daily.   Follow-up in 2 weeks for assessment prior to next cycle of Cyramza and paclitaxel. We spent sufficient time to discuss many aspect of care, questions were answered to patient's satisfaction.   ZhTalbert Cage  Tasia Catchings, MD, PhD Hematology Oncology [facility] at [facility] Pager- [phone] 03/09/18

## 2018-03-09 NOTE — Progress Notes (Signed)
Pt here for follow up. She states she has been having a little trouble walking

## 2018-03-10 LAB — CEA: CEA: 6.1 ng/mL — ABNORMAL HIGH (ref 0.0–4.7)

## 2018-03-15 ENCOUNTER — Other Ambulatory Visit: Payer: Self-pay | Admitting: Family Medicine

## 2018-03-15 MED ORDER — MIRABEGRON ER 25 MG PO TB24: 25.0000 mg | ORAL_TABLET | Freq: Every day | ORAL | 2 refills | Status: DC

## 2018-03-15 NOTE — Progress Notes (Signed)
Call patient about urinary medication options.  See if she can get prescription filled for Myrbetriq.  Prescription sent.  It may work a little better than the oxybutynin.  If she does get this prescription, then stop oxybutynin.  Update me as needed.  Thanks.

## 2018-03-16 ENCOUNTER — Telehealth: Payer: Self-pay | Admitting: Family Medicine

## 2018-03-16 NOTE — Progress Notes (Signed)
Apparently, the patient is not continuing to have urinary problems, it is sinus problems once I finally was able to talk to her.  Patient made appointment 03/18/18.

## 2018-03-16 NOTE — Telephone Encounter (Signed)
Apparently the patient is continuing to have problems with her sinuses,not urinary problems.  Appointment scheduled 03/18/18.

## 2018-03-16 NOTE — Telephone Encounter (Signed)
Copied from [place] [id number]. Topic: Quick Communication - See Telephone Encounter >> Mar 16, 2018 12:51 PM Antonieta Iba C wrote: CRM for notification. See Telephone encounter for: 03/16/18.  Pt called in to [employer] trying to reach her office [facility]) pt says the phone line is down. Pt would like to reach her provider to make him aware that she has completed her round of amoxicillin-clavulanate (AUGMENTIN) 875-125 MG tablet and still isnt feeling better. Pt would like to be advised further.    CB: [phone] OR [phone]

## 2018-03-16 NOTE — Telephone Encounter (Signed)
Please try to see what other details you can get, what sx are still going on, etc.  Thanks.

## 2018-03-16 NOTE — Telephone Encounter (Signed)
Pt was seen 03/04/18;pt has finished augmentin and is not feeling any better. Unable to reach pt for additional info.Please advise.

## 2018-03-18 ENCOUNTER — Ambulatory Visit (INDEPENDENT_AMBULATORY_CARE_PROVIDER_SITE_OTHER): Payer: Medicare Other | Admitting: Family Medicine

## 2018-03-18 ENCOUNTER — Encounter: Payer: Self-pay | Admitting: Family Medicine

## 2018-03-18 VITALS — BP 122/66 | HR 68 | Temp 97.7°F | Wt 208.5 lb

## 2018-03-18 DIAGNOSIS — J0100 Acute maxillary sinusitis, unspecified: Secondary | ICD-10-CM

## 2018-03-18 MED ORDER — AMOXICILLIN-POT CLAVULANATE 875-125 MG PO TABS: 1.0000 | ORAL_TABLET | Freq: Two times a day (BID) | ORAL | 0 refills | Status: DC

## 2018-03-18 NOTE — Patient Instructions (Signed)
I'm glad you are improving but I think you need 7 more days of augmentin   Continue current medicines for symptoms  Saline may help also   Update if not starting to improve in a week or if worsening

## 2018-03-18 NOTE — Progress Notes (Signed)
Subjective:    Patient ID: [name], female    DOB: [birth year], 83 y.o.   MRN: [id number]  HPI Here for sinus c/o   83 yo pt of Dr Damita Dunnings with hx of asthma and esophageal cancer   Diagnosed with sinusitis on 10/23 with pcp and tx with augmentin 10 d   Mucous has changed from colored to mostly clear with some green  Still congested and blows nose a lot  Some nose bleeding occ and face is still a little painful (cheeks)   A lot of dust exp from construction   Tried a 12 hour nasal spray- it made her face hurt more   Still using claritin and flonase  No fever  Does not feel bad   Patient Active Problem List   Diagnosis Date Noted  . Acute sinusitis 03/05/2018  . Urinary incontinence 03/05/2018  . Glycogenic acanthosis of the esophagus   . Chronic peptic ulcer of stomach   . Problems with swallowing and mastication   . Other foreign object in esophagus causing other injury, initial encounter   . Chronic gastric ulcer without hemorrhage and without perforation   . Anastomotic ulcer   . Hypokalemia 10/07/2017  . Thickening of esophagus   . Other specified diseases of the digestive system   . Stricture and stenosis of esophagus   . Radiation esophagitis   . Adverse effect of radiation   . Esophageal adenocarcinoma ([place])   . Coronary artery calcification seen on CT scan 08/14/2017  . Aortic atherosclerosis ([place]) 08/14/2017  . Aortic stenosis 07/07/2017  . Goals of care, counseling/discussion 05/13/2017  . Encounter for antineoplastic chemotherapy 05/12/2017  . Tenosynovitis, nodular 05/08/2017  . Malignant neoplasm of lower third of esophagus ([place]) 03/29/2017  . Liver lesion   . Iron deficiency anemia   . Dysphagia   . Loss of weight   . Abdominal pain 03/20/2017  . Insomnia 12/19/2016  . Advance care planning 12/19/2016  . Hoarseness 07/11/2015  . Tinnitus of both ears 07/11/2015  . Vocal cord paralysis, unilateral complete 07/11/2015  . Knee joint  replaced by other means 03/26/2015  . Paresthesia 08/26/2014  . Asthma without status asthmaticus 12/03/2013  . Borderline diabetes 12/03/2013  . Essential hypertension 12/03/2013  . OA (osteoarthritis) 12/03/2013  . Morbid obesity due to excess calories ([place]) 12/03/2013  . Overactive bladder 12/03/2013  . RLS (restless legs syndrome) 12/03/2013  . Sleep apnea 12/03/2013  . C4 spinal cord injury ([place]) 12/03/2013  . Dysphonia 11/24/2013  . Neuropathy 10/22/2013  . Polyneuropathy associated with critical illness ([place]) 10/22/2013  . 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 OSA on CPAP 07/16/2013   Past Medical History:  Diagnosis Date  . Arthritis   . Asthma   . COPD (chronic obstructive pulmonary disease) ([place])   . Dysphonia   . Dyspnea   . Esophageal cancer ([place])   . GERD (gastroesophageal reflux disease)   . Heart murmur   . History of kidney stones   . Hypertension   . Hypokalemia 10/07/2017  . Melanoma ([place])   . OAB (overactive bladder)   . OSA on CPAP   . 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 Personal history of chemotherapy    esophageal cancer  . Personal history of radiation therapy    esophageal cancer  . Pupil asymmetry    R pupil defect  . Skin cancer   . Sleep apnea   . Spinal cord abscess  Past Surgical History:  Procedure Laterality Date  . ABDOMINAL HYSTERECTOMY    . ANTERIOR CERVICAL DECOMP/DISCECTOMY FUSION N/A 07/16/2013   Procedure: ANTERIOR CERVICAL DECOMPRESSION/DISCECTOMY FUSION mutlipleLEVELS C4-7;  Surgeon: Erline Levine, MD;  Location: [facility];  Service: Neurosurgery;  Laterality: N/A;  . APPENDECTOMY    . carpel tunn Bilateral   . CATARACT EXTRACTION    . CATARACT EXTRACTION, BILATERAL    . CHOLECYSTECTOMY    . dental implant    . ESOPHAGOGASTRODUODENOSCOPY Left 03/21/2017   Procedure: ESOPHAGOGASTRODUODENOSCOPY (EGD);  Surgeon: Virgel Manifold, MD;  Location: [facility] ENDOSCOPY;  Service: Endoscopy;  Laterality: Left;  .  ESOPHAGOGASTRODUODENOSCOPY (EGD) WITH PROPOFOL N/A 08/20/2017   Procedure: ESOPHAGOGASTRODUODENOSCOPY (EGD) WITH PROPOFOL;  Surgeon: Virgel Manifold, MD;  Location: [facility] ENDOSCOPY;  Service: Endoscopy;  Laterality: N/A;  . ESOPHAGOGASTRODUODENOSCOPY (EGD) WITH PROPOFOL N/A 09/19/2017   Procedure: ESOPHAGOGASTRODUODENOSCOPY (EGD) WITH PROPOFOL;  Surgeon: Virgel Manifold, MD;  Location: [facility] ENDOSCOPY;  Service: Endoscopy;  Laterality: N/A;  . ESOPHAGOGASTRODUODENOSCOPY (EGD) WITH PROPOFOL N/A 10/03/2017   Procedure: ESOPHAGOGASTRODUODENOSCOPY (EGD) WITH PROPOFOL;  Surgeon: Virgel Manifold, MD;  Location: [facility] ENDOSCOPY;  Service: Endoscopy;  Laterality: N/A;  . ESOPHAGOGASTRODUODENOSCOPY (EGD) WITH PROPOFOL N/A 10/22/2017   Procedure: ESOPHAGOGASTRODUODENOSCOPY (EGD) WITH PROPOFOL;  Surgeon: Virgel Manifold, MD;  Location: [facility] ENDOSCOPY;  Service: Endoscopy;  Laterality: N/A;  . ESOPHAGOGASTRODUODENOSCOPY (EGD) WITH PROPOFOL N/A 11/18/2017   Procedure: ESOPHAGOGASTRODUODENOSCOPY (EGD) WITH PROPOFOL;  Surgeon: Virgel Manifold, MD;  Location: [facility] ENDOSCOPY;  Service: Endoscopy;  Laterality: N/A;  . ESOPHAGOGASTRODUODENOSCOPY (EGD) WITH PROPOFOL N/A 12/17/2017   Procedure: ESOPHAGOGASTRODUODENOSCOPY (EGD) WITH PROPOFOL;  Surgeon: Virgel Manifold, MD;  Location: [facility] ENDOSCOPY;  Service: Endoscopy;  Laterality: N/A;  . ESOPHAGOGASTRODUODENOSCOPY (EGD) WITH PROPOFOL N/A 01/08/2018   Procedure: ESOPHAGOGASTRODUODENOSCOPY (EGD) WITH PROPOFOL;  Surgeon: Virgel Manifold, MD;  Location: [facility] ENDOSCOPY;  Service: Endoscopy;  Laterality: N/A;  . ESOPHAGOGASTRODUODENOSCOPY (EGD) WITH PROPOFOL N/A 01/22/2018   Procedure: ESOPHAGOGASTRODUODENOSCOPY (EGD) WITH PROPOFOL;  Surgeon: Virgel Manifold, MD;  Location: [facility] ENDOSCOPY;  Service: Endoscopy;  Laterality: N/A;  . EYE SURGERY    . GASTRIC BYPASS    . HERNIA REPAIR    . HIATAL HERNIA REPAIR    . JOINT REPLACEMENT    . MELANOMA  EXCISION    . PORTA CATH INSERTION N/A 04/07/2017   Procedure: PORTA CATH INSERTION;  Surgeon: Algernon Huxley, MD;  Location: [facility] CV LAB;  Service: Cardiovascular;  Laterality: N/A;  . POSTERIOR CERVICAL FUSION/FORAMINOTOMY N/A 07/28/2013   Procedure: Cervical four-seven  posterior cervical fusion;  Surgeon: Erline Levine, MD;  Location: [facility]UR[facility] ORS;  Service: Neurosurgery;  Laterality: N/A;  . TONSILLECTOMY    . TOTAL KNEE ARTHROPLASTY     Social History   Tobacco Use  . Smoking status: Former Smoker    Packs/day: 1.00    Years: 20.00    Pack years: 20.00    Types: Cigarettes    Last attempt to quit: 1977    Years since quitting: 42.8  . Smokeless tobacco: Never Used  Substance Use Topics  . Alcohol use: No  . Drug use: Never   Family History  Problem Relation Age of Onset  . Breast cancer Mother 30  . Arthritis-Osteo Mother   . Breast cancer Sister 69  . Bladder Cancer Sister   . Bladder Cancer Father   . Tongue cancer Father   . Congenital heart disease Father   . Prostate cancer Brother   . Uterine  cancer Maternal Aunt   . Lung cancer Paternal Uncle   . Leukemia Maternal Grandmother   . Liver cancer Paternal Grandmother   . Colon cancer Neg Hx    Allergies  Allergen Reactions  . Cefuroxime Nausea Only  . Codeine Nausea Only  . Doxycycline Other (See Comments)    Lip swelling  . Gabapentin Other (See Comments)    Swelling.   . Iohexol Itching    07-15-13 pt developed itching on fingers after contrast given. Dr. Irish Elders looked at pt and said to put in system as allergy. BB  . Other Other (See Comments)    Contrast Dye- caused fever, chills, awful feeling Bandages - itching, rash  . Oxycodone Other (See Comments)    nausea  . Quinolones Swelling    Lip swelling  . Synvisc [Hylan G-F 20] Swelling   Current Outpatient Medications on File Prior to Visit  Medication Sig Dispense Refill  . acetaminophen (TYLENOL) 325 MG tablet Take 650 mg by mouth every 6  (six) hours as needed for moderate pain or headache.     . ALBUTEROL SULFATE HFA IN Inhale into the lungs.     . bisacodyl (DULCOLAX) 10 MG suppository Place rectally.    . calcium carbonate (TUMS EX) 750 MG chewable tablet Chew by mouth.    Marland Kitchen CARAFATE 1 GM/10ML suspension TAKE 10MLS BY MOUTH FOUR TIMES DAILY 420 mL 1  . Cholecalciferol (VITAMIN D) 2000 UNITS CAPS Take 2,000 Units by mouth daily.    . cyanocobalamin 500 MCG tablet Take 500 mcg every other day by mouth.    . Diclofenac Sodium 1 % CREA Apply to left arm as needed for pain three times daily 120 g 0  . dronabinol (MARINOL) 5 MG capsule Take 1 capsule (5 mg total) by mouth 2 (two) times daily before a meal. 60 capsule 0  . fluticasone (FLONASE) 50 MCG/ACT nasal spray Place 1-2 sprays into both nostrils daily. 16 g 5  . furosemide (LASIX) 20 MG tablet Take 1 tablet (20 mg total) by mouth daily. 90 tablet 3  . hydrocortisone cream 1 % Apply 1 application topically daily as needed for itching.     . loratadine (CLARITIN) 10 MG tablet Take 10 mg by mouth daily.    Marland Kitchen LYRICA 150 MG capsule TAKE 1 CAPSULE BY MOUTH TWO TIMES DAILY 180 capsule 1  . metoprolol succinate (TOPROL-XL) 50 MG 24 hr tablet TAKE 1 TABLET BY MOUTH DAILY 90 tablet 1  . mirabegron ER (MYRBETRIQ) 25 MG TB24 tablet Take 1 tablet (25 mg total) by mouth daily. 30 tablet 2  . Multiple Vitamins-Minerals (MULTIVITAMIN ADULT) CHEW Chew by mouth daily.    Vladimir Faster Glycol-Propyl Glycol (SYSTANE OP) Apply 1 drop to eye daily as needed (dry eyes).    . potassium chloride (KLOR-CON) 20 MEQ packet Take 20 mEq by mouth daily. 30 packet 1  . pyridOXINE (VITAMIN B-6) 100 MG tablet Take 1 tablet (100 mg total) by mouth daily. 30 tablet 3  . senna-docusate (SENOKOT-S) 8.6-50 MG tablet Take 1 tablet at bedtime as needed by mouth for mild constipation.    . traMADol (ULTRAM) 50 MG tablet Take 1 tablet (50 mg total) by mouth every 6 (six) hours as needed. 30 tablet 0  . traZODone  (DESYREL) 50 MG tablet TAKE 1/2 TABLET BY MOUTH AT BEDTIME IF NEEDED FOR SLEEP 45 tablet 3  . pantoprazole (PROTONIX) 40 MG tablet Take 1 tablet (40 mg total) by mouth 2 (two)  times daily before a meal. 60 tablet 1  . ranitidine (ZANTAC) 150 MG/10ML syrup Take 10 mLs (150 mg total) by mouth at bedtime. 300 mL 3  . [DISCONTINUED] prochlorperazine (COMPAZINE) 10 MG tablet Take 1 tablet (10 mg total) by mouth every 6 (six) hours as needed (Nausea or vomiting). (Patient not taking: Reported on 01/22/2018) 30 tablet 1   Current Facility-Administered Medications on File Prior to Visit  Medication Dose Route Frequency Provider Last Rate Last Dose  . sodium chloride flush (NS) 0.9 % injection 10 mL  10 mL Intravenous PRN Earlie Server, MD   10 mL at 01/26/18 3220     Review of Systems  Constitutional: Negative for appetite change, fatigue and fever.  HENT: Positive for congestion, postnasal drip, rhinorrhea, sinus pressure, sinus pain and sore throat. Negative for ear pain and nosebleeds.   Eyes: Negative for pain, redness and itching.  Respiratory: Negative for cough, shortness of breath and wheezing.        Little to no cough or chest congestion   Cardiovascular: Negative for chest pain.  Gastrointestinal: Negative for abdominal pain, diarrhea, nausea and vomiting.  Endocrine: Negative for polyuria.  Genitourinary: Negative for dysuria, frequency and urgency.  Musculoskeletal: Negative for arthralgias and myalgias.  Allergic/Immunologic: Negative for immunocompromised state.  Neurological: Positive for headaches. Negative for dizziness, tremors, syncope, weakness and numbness.  Hematological: Negative for adenopathy. Does not bruise/bleed easily.  Psychiatric/Behavioral: Negative for dysphoric mood. The patient is not nervous/anxious.        Objective:   Physical Exam  Constitutional: She appears well-developed and well-nourished. No distress.  obese and well appearing   HENT:  Head:  Normocephalic and atraumatic.  Right Ear: External ear normal.  Left Ear: External ear normal.  Mouth/Throat: Oropharynx is clear and moist.  Nares are injected and congested   Bilateral maxillary sinus tenderness  Throat is clear (some clear pnd)   Eyes: Pupils are equal, round, and reactive to light. Conjunctivae and EOM are normal. Right eye exhibits no discharge. Left eye exhibits no discharge. No scleral icterus.  Neck: Normal range of motion. Neck supple.  Cardiovascular: Normal rate and regular rhythm.  Murmur heard. Pulmonary/Chest: Effort normal and breath sounds normal. No stridor. No respiratory distress. She has no wheezes. She has no rales.  Good air exch   Lymphadenopathy:    She has no cervical adenopathy.  Neurological: She is alert. No cranial nerve deficit.  Skin: Skin is warm and dry. No rash noted.  Psychiatric: She has a normal mood and affect.  Pleasant           Assessment & Plan:   Problem List Items Addressed This Visit      Respiratory   Acute sinusitis - Primary    In immunocompromised cancer pt - s/p tx with 10 d of augmentin Overall improved but not resolved Will extend augmentin another 7d  Disc poss side eff Continue flonase Add saline if helpful Update if not starting to improve in a week or if worsening    Meds ordered this encounter  Medications  . amoxicillin-clavulanate (AUGMENTIN) 875-125 MG tablet    Sig: Take 1 tablet by mouth 2 (two) times daily.    Dispense:  14 tablet    Refill:  0          Relevant Medications   amoxicillin-clavulanate (AUGMENTIN) 875-125 MG tablet

## 2018-03-18 NOTE — Assessment & Plan Note (Signed)
In immunocompromised cancer pt - s/p tx with 10 d of augmentin Overall improved but not resolved Will extend augmentin another 7d  Disc poss side eff Continue flonase Add saline if helpful Update if not starting to improve in a week or if worsening    Meds ordered this encounter  Medications  . amoxicillin-clavulanate (AUGMENTIN) 875-125 MG tablet    Sig: Take 1 tablet by mouth 2 (two) times daily.    Dispense:  14 tablet    Refill:  0

## 2018-03-23 ENCOUNTER — Inpatient Hospital Stay: Payer: Medicare Other | Attending: Oncology

## 2018-03-23 ENCOUNTER — Inpatient Hospital Stay (HOSPITAL_BASED_OUTPATIENT_CLINIC_OR_DEPARTMENT_OTHER): Payer: Medicare Other | Admitting: Oncology

## 2018-03-23 ENCOUNTER — Encounter: Payer: Self-pay | Admitting: Oncology

## 2018-03-23 ENCOUNTER — Other Ambulatory Visit: Payer: Self-pay

## 2018-03-23 ENCOUNTER — Inpatient Hospital Stay: Payer: Medicare Other

## 2018-03-23 VITALS — BP 138/78 | HR 66 | Resp 18

## 2018-03-23 VITALS — BP 151/86 | HR 69 | Temp 97.0°F | Resp 16 | Wt 207.0 lb

## 2018-03-23 DIAGNOSIS — Z5111 Encounter for antineoplastic chemotherapy: Secondary | ICD-10-CM | POA: Diagnosis not present

## 2018-03-23 DIAGNOSIS — Z923 Personal history of irradiation: Secondary | ICD-10-CM | POA: Insufficient documentation

## 2018-03-23 DIAGNOSIS — C155 Malignant neoplasm of lower third of esophagus: Secondary | ICD-10-CM

## 2018-03-23 DIAGNOSIS — Z79899 Other long term (current) drug therapy: Secondary | ICD-10-CM | POA: Diagnosis not present

## 2018-03-23 DIAGNOSIS — Z85820 Personal history of malignant melanoma of skin: Secondary | ICD-10-CM | POA: Insufficient documentation

## 2018-03-23 DIAGNOSIS — G629 Polyneuropathy, unspecified: Secondary | ICD-10-CM

## 2018-03-23 DIAGNOSIS — K5900 Constipation, unspecified: Secondary | ICD-10-CM | POA: Insufficient documentation

## 2018-03-23 DIAGNOSIS — J329 Chronic sinusitis, unspecified: Secondary | ICD-10-CM | POA: Diagnosis not present

## 2018-03-23 DIAGNOSIS — R5381 Other malaise: Secondary | ICD-10-CM | POA: Diagnosis not present

## 2018-03-23 DIAGNOSIS — R634 Abnormal weight loss: Secondary | ICD-10-CM | POA: Diagnosis not present

## 2018-03-23 DIAGNOSIS — R1319 Other dysphagia: Secondary | ICD-10-CM | POA: Insufficient documentation

## 2018-03-23 DIAGNOSIS — Z8042 Family history of malignant neoplasm of prostate: Secondary | ICD-10-CM | POA: Insufficient documentation

## 2018-03-23 DIAGNOSIS — I10 Essential (primary) hypertension: Secondary | ICD-10-CM | POA: Diagnosis not present

## 2018-03-23 DIAGNOSIS — C787 Secondary malignant neoplasm of liver and intrahepatic bile duct: Secondary | ICD-10-CM | POA: Diagnosis not present

## 2018-03-23 DIAGNOSIS — Z803 Family history of malignant neoplasm of breast: Secondary | ICD-10-CM | POA: Insufficient documentation

## 2018-03-23 DIAGNOSIS — K1230 Oral mucositis (ulcerative), unspecified: Secondary | ICD-10-CM

## 2018-03-23 DIAGNOSIS — Z8052 Family history of malignant neoplasm of bladder: Secondary | ICD-10-CM | POA: Insufficient documentation

## 2018-03-23 DIAGNOSIS — M1990 Unspecified osteoarthritis, unspecified site: Secondary | ICD-10-CM

## 2018-03-23 DIAGNOSIS — Z800 Family history of malignant neoplasm of digestive organs: Secondary | ICD-10-CM | POA: Insufficient documentation

## 2018-03-23 DIAGNOSIS — Z85828 Personal history of other malignant neoplasm of skin: Secondary | ICD-10-CM | POA: Insufficient documentation

## 2018-03-23 DIAGNOSIS — R5383 Other fatigue: Secondary | ICD-10-CM | POA: Insufficient documentation

## 2018-03-23 DIAGNOSIS — K219 Gastro-esophageal reflux disease without esophagitis: Secondary | ICD-10-CM | POA: Diagnosis not present

## 2018-03-23 DIAGNOSIS — R1012 Left upper quadrant pain: Secondary | ICD-10-CM | POA: Diagnosis not present

## 2018-03-23 DIAGNOSIS — J449 Chronic obstructive pulmonary disease, unspecified: Secondary | ICD-10-CM

## 2018-03-23 DIAGNOSIS — E876 Hypokalemia: Secondary | ICD-10-CM

## 2018-03-23 DIAGNOSIS — Z801 Family history of malignant neoplasm of trachea, bronchus and lung: Secondary | ICD-10-CM | POA: Insufficient documentation

## 2018-03-23 DIAGNOSIS — Z806 Family history of leukemia: Secondary | ICD-10-CM | POA: Insufficient documentation

## 2018-03-23 DIAGNOSIS — K222 Esophageal obstruction: Secondary | ICD-10-CM

## 2018-03-23 DIAGNOSIS — Z87891 Personal history of nicotine dependence: Secondary | ICD-10-CM

## 2018-03-23 LAB — COMPREHENSIVE METABOLIC PANEL
ALK PHOS: 56 U/L (ref 38–126)
ALT: 16 U/L (ref 0–44)
AST: 27 U/L (ref 15–41)
Albumin: 3.6 g/dL (ref 3.5–5.0)
Anion gap: 7 (ref 5–15)
BILIRUBIN TOTAL: 0.5 mg/dL (ref 0.3–1.2)
BUN: 13 mg/dL (ref 8–23)
CALCIUM: 8.7 mg/dL — AB (ref 8.9–10.3)
CO2: 29 mmol/L (ref 22–32)
CREATININE: 0.61 mg/dL (ref 0.44–1.00)
Chloride: 107 mmol/L (ref 98–111)
Glucose, Bld: 106 mg/dL — ABNORMAL HIGH (ref 70–99)
Potassium: 3.7 mmol/L (ref 3.5–5.1)
Sodium: 143 mmol/L (ref 135–145)
TOTAL PROTEIN: 6.6 g/dL (ref 6.5–8.1)

## 2018-03-23 LAB — URINALYSIS, DIPSTICK ONLY
Bilirubin Urine: NEGATIVE
GLUCOSE, UA: NEGATIVE mg/dL
Hgb urine dipstick: NEGATIVE
Ketones, ur: NEGATIVE mg/dL
LEUKOCYTES UA: NEGATIVE
NITRITE: NEGATIVE
PH: 5 (ref 5.0–8.0)
PROTEIN: NEGATIVE mg/dL
Specific Gravity, Urine: 1.012 (ref 1.005–1.030)

## 2018-03-23 LAB — CBC WITH DIFFERENTIAL/PLATELET
ABS IMMATURE GRANULOCYTES: 0.05 10*3/uL (ref 0.00–0.07)
Basophils Absolute: 0.1 10*3/uL (ref 0.0–0.1)
Basophils Relative: 1 %
Eosinophils Absolute: 0.1 10*3/uL (ref 0.0–0.5)
Eosinophils Relative: 3 %
HEMATOCRIT: 37.7 % (ref 36.0–46.0)
Hemoglobin: 11.8 g/dL — ABNORMAL LOW (ref 12.0–15.0)
IMMATURE GRANULOCYTES: 1 %
LYMPHS ABS: 0.6 10*3/uL — AB (ref 0.7–4.0)
LYMPHS PCT: 16 %
MCH: 32.5 pg (ref 26.0–34.0)
MCHC: 31.3 g/dL (ref 30.0–36.0)
MCV: 103.9 fL — AB (ref 80.0–100.0)
MONOS PCT: 16 %
Monocytes Absolute: 0.6 10*3/uL (ref 0.1–1.0)
NEUTROS ABS: 2.2 10*3/uL (ref 1.7–7.7)
NEUTROS PCT: 63 %
Platelets: 200 10*3/uL (ref 150–400)
RBC: 3.63 MIL/uL — ABNORMAL LOW (ref 3.87–5.11)
RDW: 15.7 % — ABNORMAL HIGH (ref 11.5–15.5)
WBC: 3.5 10*3/uL — ABNORMAL LOW (ref 4.0–10.5)
nRBC: 0 % (ref 0.0–0.2)

## 2018-03-23 MED ORDER — ACETAMINOPHEN 325 MG PO TABS
650.0000 mg | ORAL_TABLET | Freq: Once | ORAL | Status: AC
  Administered 2018-03-23: 650 mg via ORAL
  Filled 2018-03-23: qty 2

## 2018-03-23 MED ORDER — SODIUM CHLORIDE 0.9 % IV SOLN
80.0000 mg/m2 | Freq: Once | INTRAVENOUS | Status: AC
  Administered 2018-03-23: 162 mg via INTRAVENOUS
  Filled 2018-03-23: qty 27

## 2018-03-23 MED ORDER — HEPARIN SOD (PORK) LOCK FLUSH 100 UNIT/ML IV SOLN
500.0000 [IU] | Freq: Once | INTRAVENOUS | Status: AC | PRN
  Administered 2018-03-23: 500 [IU]
  Filled 2018-03-23: qty 5

## 2018-03-23 MED ORDER — SODIUM CHLORIDE 0.9 % IV SOLN
Freq: Once | INTRAVENOUS | Status: AC
  Administered 2018-03-23: 09:00:00 via INTRAVENOUS
  Filled 2018-03-23: qty 250

## 2018-03-23 MED ORDER — SODIUM CHLORIDE 0.9 % IV SOLN
8.0000 mg/kg | Freq: Once | INTRAVENOUS | Status: AC
  Administered 2018-03-23: 700 mg via INTRAVENOUS
  Filled 2018-03-23: qty 70

## 2018-03-23 MED ORDER — DIPHENHYDRAMINE HCL 50 MG/ML IJ SOLN
50.0000 mg | Freq: Once | INTRAMUSCULAR | Status: AC
  Administered 2018-03-23: 50 mg via INTRAVENOUS
  Filled 2018-03-23: qty 1

## 2018-03-23 MED ORDER — CHLORHEXIDINE GLUCONATE 0.12 % MT SOLN: 15.0000 mL | Freq: Two times a day (BID) | OROMUCOSAL | 0 refills | Status: DC

## 2018-03-23 MED ORDER — FAMOTIDINE IN NACL 20-0.9 MG/50ML-% IV SOLN
20.0000 mg | Freq: Once | INTRAVENOUS | Status: AC
  Administered 2018-03-23: 20 mg via INTRAVENOUS
  Filled 2018-03-23: qty 50

## 2018-03-23 MED ORDER — DEXAMETHASONE SODIUM PHOSPHATE 10 MG/ML IJ SOLN
10.0000 mg | Freq: Once | INTRAMUSCULAR | Status: AC
  Administered 2018-03-23: 10 mg via INTRAVENOUS
  Filled 2018-03-23: qty 1

## 2018-03-23 NOTE — Progress Notes (Signed)
Hematology/Oncology Follow Up Note [facility]  Telephone:([phone] [phone]  Patient Care Team: Tonia Ghent, MD as PCP - General (Family Medicine)   Name of the patient: [name]  [id number]  15-Feb-1935   REASON FOR VISIT Follow up of chemotherapy management of esophageal adenocarcinoma  HISTORY OF PRESENT ILLNESS Patient is a 83 year old female who has metastatic esophageal cancer with liver involvement.  Weight loss 20 pounds for the past year prior to diagnosis.  # EGD during admission showed partially obstructive esophageal mass at the distal part of esophagus. Biopsy was taken. Pathology showed esophageal adenocarcinoma. #US biopsy of liver lesion to proof distant metastasis.  Report family history of breast cancer, but she has been having routine mammograms.  # Lives alone. She's a former smoker.  # Molecular Testing:  #[employer] Cdx: No reportable alternations with companion diagnostic (CDx) claims.  MS stable Tumor mutation burden: Cannot be determined, CCND3 amplification: Equivocal.  Amended reports on 06/04/2017, Tumor mutational burden changed from can not be determined to "TMB -low" on the re-analyzed samples.  # HER 2 negative.   Current Treatment S/p 6 cycles of FOLFOX, PET scan showed excellent partial response from both chemotherapy and radiation, result was discussed with patient.  currently on 5-FU maintenance.  S/p palliative radiation in January 2019 # Developed esophageal stricture secondary to radiation in April and got dilation via endoscopy. EGD shoed Benign-appearing esophageal stenosis. This is a result of radiation therapy.- LA Grade C radiation esophagitis. - The previously noted esophageal mass in Nov 2018 was much decreased indicating good response to radiation/chemotherapy. - Normal stomach.- Normal examined jejunum.- No specimens collected.    INTERVAL HISTORY  Patient with above history presents  for assessment prior to second line chemotherapy for treatment of metastatic esophageal cancer.  #Chemotherapy, reports tolerating well, denies nausea vomiting and diarrhea. #Mouth sore.  She reports having a few mouth sores after last treatment.  She used Magic mouthwash. Symptoms improved.  Currently she does not have any mouth sore today. #Chronic neuropathy is at baseline.  She takes Lyrica.Marland Kitchen #Sinusitis: Symptoms improved.  She was given the extended course of Augmentin. #Dysphagia, no swallowing difficulty. #Constipation, started to take stool softener.  Symptoms improved.  Review of systems Review of Systems  Constitutional: Positive for malaise/fatigue. Negative for chills, diaphoresis, fever and weight loss.  HENT: Negative for ear discharge, ear pain, hearing loss, nosebleeds and sore throat.        Mouth sore resolved.  Eyes: Negative for double vision, photophobia, pain, discharge and redness.  Respiratory: Negative for cough, hemoptysis, shortness of breath and wheezing.   Cardiovascular: Negative for chest pain, palpitations, orthopnea, claudication and leg swelling.  Gastrointestinal: Positive for constipation. Negative for abdominal pain, blood in stool, diarrhea, heartburn, melena, nausea and vomiting.       Dysphagia is better  Genitourinary: Negative for dysuria, flank pain, frequency, hematuria and urgency.  Musculoskeletal: Negative for back pain, joint pain, myalgias and neck pain.  Skin: Negative for itching and rash.  Neurological: Positive for tingling and sensory change. Negative for dizziness, tremors, focal weakness, weakness and headaches.  Endo/Heme/Allergies: Negative for environmental allergies. Does not bruise/bleed easily.  Psychiatric/Behavioral: Negative for depression, hallucinations, substance abuse and suicidal ideas. The patient is not nervous/anxious.     Allergies  Allergen Reactions  . Cefuroxime Nausea Only  . Codeine Nausea Only  .  Doxycycline Other (See Comments)    Lip swelling  . Gabapentin Other (See Comments)  Swelling.   . Iohexol Itching    07-15-13 pt developed itching on fingers after contrast given. Dr. Irish Elders looked at pt and said to put in system as allergy. BB  . Other Other (See Comments)    Contrast Dye- caused fever, chills, awful feeling Bandages - itching, rash  . Oxycodone Other (See Comments)    nausea  . Quinolones Swelling    Lip swelling  . Synvisc [Hylan G-F 20] Swelling     Past Medical History:  Diagnosis Date  . Arthritis   . Asthma   . COPD (chronic obstructive pulmonary disease) ([place])   . Dysphonia   . Dyspnea   . Esophageal cancer ([place])   . GERD (gastroesophageal reflux disease)   . Heart murmur   . History of kidney stones   . Hypertension   . Hypokalemia 10/07/2017  . Melanoma ([place])   . OAB (overactive bladder)   . OSA on CPAP   . 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 Personal history of chemotherapy    esophageal cancer  . Personal history of radiation therapy    esophageal cancer  . Pupil asymmetry    R pupil defect  . Skin cancer   . Sleep apnea   . Spinal cord abscess      Past Surgical History:  Procedure Laterality Date  . ABDOMINAL HYSTERECTOMY    . ANTERIOR CERVICAL DECOMP/DISCECTOMY FUSION N/A 07/16/2013   Procedure: ANTERIOR CERVICAL DECOMPRESSION/DISCECTOMY FUSION mutlipleLEVELS C4-7;  Surgeon: Erline Levine, MD;  Location: [place] NEURO ORS;  Service: Neurosurgery;  Laterality: N/A;  . APPENDECTOMY    . carpel tunn Bilateral   . CATARACT EXTRACTION    . CATARACT EXTRACTION, BILATERAL    . CHOLECYSTECTOMY    . dental implant    . ESOPHAGOGASTRODUODENOSCOPY Left 03/21/2017   Procedure: ESOPHAGOGASTRODUODENOSCOPY (EGD);  Surgeon: Virgel Manifold, MD;  Location: [facility] ENDOSCOPY;  Service: Endoscopy;  Laterality: Left;  . ESOPHAGOGASTRODUODENOSCOPY (EGD) WITH PROPOFOL N/A 08/20/2017   Procedure: ESOPHAGOGASTRODUODENOSCOPY (EGD) WITH  PROPOFOL;  Surgeon: Virgel Manifold, MD;  Location: [facility] ENDOSCOPY;  Service: Endoscopy;  Laterality: N/A;  . ESOPHAGOGASTRODUODENOSCOPY (EGD) WITH PROPOFOL N/A 09/19/2017   Procedure: ESOPHAGOGASTRODUODENOSCOPY (EGD) WITH PROPOFOL;  Surgeon: Virgel Manifold, MD;  Location: [facility] ENDOSCOPY;  Service: Endoscopy;  Laterality: N/A;  . ESOPHAGOGASTRODUODENOSCOPY (EGD) WITH PROPOFOL N/A 10/03/2017   Procedure: ESOPHAGOGASTRODUODENOSCOPY (EGD) WITH PROPOFOL;  Surgeon: Virgel Manifold, MD;  Location: [facility] ENDOSCOPY;  Service: Endoscopy;  Laterality: N/A;  . ESOPHAGOGASTRODUODENOSCOPY (EGD) WITH PROPOFOL N/A 10/22/2017   Procedure: ESOPHAGOGASTRODUODENOSCOPY (EGD) WITH PROPOFOL;  Surgeon: Virgel Manifold, MD;  Location: [facility] ENDOSCOPY;  Service: Endoscopy;  Laterality: N/A;  . ESOPHAGOGASTRODUODENOSCOPY (EGD) WITH PROPOFOL N/A 11/18/2017   Procedure: ESOPHAGOGASTRODUODENOSCOPY (EGD) WITH PROPOFOL;  Surgeon: Virgel Manifold, MD;  Location: [facility] ENDOSCOPY;  Service: Endoscopy;  Laterality: N/A;  . ESOPHAGOGASTRODUODENOSCOPY (EGD) WITH PROPOFOL N/A 12/17/2017   Procedure: ESOPHAGOGASTRODUODENOSCOPY (EGD) WITH PROPOFOL;  Surgeon: Virgel Manifold, MD;  Location: [facility] ENDOSCOPY;  Service: Endoscopy;  Laterality: N/A;  . ESOPHAGOGASTRODUODENOSCOPY (EGD) WITH PROPOFOL N/A 01/08/2018   Procedure: ESOPHAGOGASTRODUODENOSCOPY (EGD) WITH PROPOFOL;  Surgeon: Virgel Manifold, MD;  Location: [facility] ENDOSCOPY;  Service: Endoscopy;  Laterality: N/A;  . ESOPHAGOGASTRODUODENOSCOPY (EGD) WITH PROPOFOL N/A 01/22/2018   Procedure: ESOPHAGOGASTRODUODENOSCOPY (EGD) WITH PROPOFOL;  Surgeon: Virgel Manifold, MD;  Location: [facility] ENDOSCOPY;  Service: Endoscopy;  Laterality: N/A;  . EYE SURGERY    . GASTRIC BYPASS    . HERNIA REPAIR    .  HIATAL HERNIA REPAIR    . JOINT REPLACEMENT    . MELANOMA EXCISION    . PORTA CATH INSERTION N/A 04/07/2017   Procedure: PORTA CATH INSERTION;  Surgeon: Algernon Huxley,  MD;  Location: [place] CV LAB;  Service: Cardiovascular;  Laterality: N/A;  . POSTERIOR CERVICAL FUSION/FORAMINOTOMY N/A 07/28/2013   Procedure: Cervical four-seven  posterior cervical fusion;  Surgeon: Erline Levine, MD;  Location: [place] NEURO ORS;  Service: Neurosurgery;  Laterality: N/A;  . TONSILLECTOMY    . TOTAL KNEE ARTHROPLASTY      Social History   Socioeconomic History  . Marital status: Widowed    Spouse name: Not on file  . Number of children: 2  . Years of education: Not on file  . Highest education level: Master's degree (e.g., MA, MS, MEng, MEd, MSW, MBA)  Occupational History  . Not on file  Social Needs  . Financial resource strain: Not hard at all  . Food insecurity:    Worry: Never true    Inability: Never true  . Transportation needs:    Medical: No    Non-medical: No  Tobacco Use  . Smoking status: Former Smoker    Packs/day: 1.00    Years: 20.00    Pack years: 20.00    Types: Cigarettes    Last attempt to quit: 1977    Years since quitting: 42.8  . Smokeless tobacco: Never Used  Substance and Sexual Activity  . Alcohol use: No  . Drug use: Never  . Sexual activity: Never  Lifestyle  . Physical activity:    Days per week: 2 days    Minutes per session: 30 min  . Stress: Not at all  Relationships  . Social connections:    Talks on phone: More than three times a week    Gets together: More than three times a week    Attends religious service: More than 4 times per year    Active member of club or organization: Yes    Attends meetings of clubs or organizations: More than 4 times per year    Relationship status: Widowed  . Intimate partner violence:    Fear of current or ex partner: No    Emotionally abused: No    Physically abused: No    Forced sexual activity: No  Other Topics Concern  . Not on file  Social History Narrative   Marital Status- Widowed    Lives by herself    Employement- Retired Pharmacist, hospital   Exercise hx- Does PT      Family History  Problem Relation Age of Onset  . Breast cancer Mother 53  . Arthritis-Osteo Mother   . Breast cancer Sister 20  . Bladder Cancer Sister   . Bladder Cancer Father   . Tongue cancer Father   . Congenital heart disease Father   . Prostate cancer Brother   . Uterine cancer Maternal Aunt   . Lung cancer Paternal Uncle   . Leukemia Maternal Grandmother   . Liver cancer Paternal Grandmother   . Colon cancer Neg Hx      Current Outpatient Medications:  .  acetaminophen (TYLENOL) 325 MG tablet, Take 650 mg by mouth every 6 (six) hours as needed for moderate pain or headache. , Disp: , Rfl:  .  ALBUTEROL SULFATE HFA IN, Inhale into the lungs. , Disp: , Rfl:  .  amoxicillin-clavulanate (AUGMENTIN) 875-125 MG tablet, Take 1 tablet by mouth 2 (two) times daily., Disp: 14 tablet, Rfl:  0 .  bisacodyl (DULCOLAX) 10 MG suppository, Place rectally., Disp: , Rfl:  .  calcium carbonate (TUMS EX) 750 MG chewable tablet, Chew by mouth., Disp: , Rfl:  .  CARAFATE 1 GM/10ML suspension, TAKE 10MLS BY MOUTH FOUR TIMES DAILY, Disp: 420 mL, Rfl: 1 .  Cholecalciferol (VITAMIN D) 2000 UNITS CAPS, Take 2,000 Units by mouth daily., Disp: , Rfl:  .  cyanocobalamin 500 MCG tablet, Take 500 mcg every other day by mouth., Disp: , Rfl:  .  Diclofenac Sodium 1 % CREA, Apply to left arm as needed for pain three times daily, Disp: 120 g, Rfl: 0 .  dronabinol (MARINOL) 5 MG capsule, Take 1 capsule (5 mg total) by mouth 2 (two) times daily before a meal., Disp: 60 capsule, Rfl: 0 .  fluticasone (FLONASE) 50 MCG/ACT nasal spray, Place 1-2 sprays into both nostrils daily., Disp: 16 g, Rfl: 5 .  furosemide (LASIX) 20 MG tablet, Take 1 tablet (20 mg total) by mouth daily., Disp: 90 tablet, Rfl: 3 .  hydrocortisone cream 1 %, Apply 1 application topically daily as needed for itching. , Disp: , Rfl:  .  loratadine (CLARITIN) 10 MG tablet, Take 10 mg by mouth daily., Disp: , Rfl:  .  LYRICA 150 MG capsule,  TAKE 1 CAPSULE BY MOUTH TWO TIMES DAILY, Disp: 180 capsule, Rfl: 1 .  metoprolol succinate (TOPROL-XL) 50 MG 24 hr tablet, TAKE 1 TABLET BY MOUTH DAILY, Disp: 90 tablet, Rfl: 1 .  mirabegron ER (MYRBETRIQ) 25 MG TB24 tablet, Take 1 tablet (25 mg total) by mouth daily., Disp: 30 tablet, Rfl: 2 .  Multiple Vitamins-Minerals (MULTIVITAMIN ADULT) CHEW, Chew by mouth daily., Disp: , Rfl:  .  pantoprazole (PROTONIX) 40 MG tablet, Take 1 tablet (40 mg total) by mouth 2 (two) times daily before a meal., Disp: 60 tablet, Rfl: 1 .  Polyethyl Glycol-Propyl Glycol (SYSTANE OP), Apply 1 drop to eye daily as needed (dry eyes)., Disp: , Rfl:  .  potassium chloride (KLOR-CON) 20 MEQ packet, Take 20 mEq by mouth daily., Disp: 30 packet, Rfl: 1 .  pyridOXINE (VITAMIN B-6) 100 MG tablet, Take 1 tablet (100 mg total) by mouth daily., Disp: 30 tablet, Rfl: 3 .  ranitidine (ZANTAC) 150 MG/10ML syrup, Take 10 mLs (150 mg total) by mouth at bedtime., Disp: 300 mL, Rfl: 3 .  senna-docusate (SENOKOT-S) 8.6-50 MG tablet, Take 1 tablet at bedtime as needed by mouth for mild constipation., Disp: , Rfl:  .  traMADol (ULTRAM) 50 MG tablet, Take 1 tablet (50 mg total) by mouth every 6 (six) hours as needed., Disp: 30 tablet, Rfl: 0 .  traZODone (DESYREL) 50 MG tablet, TAKE 1/2 TABLET BY MOUTH AT BEDTIME IF NEEDED FOR SLEEP, Disp: 45 tablet, Rfl: 3 No current facility-administered medications for this visit.   Facility-Administered Medications Ordered in Other Visits:  .  sodium chloride flush (NS) 0.9 % injection 10 mL, 10 mL, Intravenous, PRN, Earlie Server, MD, 10 mL at 01/26/18 0904  Physical exam:  Vitals:   03/23/18 0849  BP: (!) 151/86  Pulse: 69  Resp: 16  Temp: (!) 97 F (36.1 C)  TempSrc: Tympanic  Weight: 207 lb (93.9 kg)  ECOG 1 Physical Exam  Constitutional: She is oriented to person, place, and time. No distress.  HENT:  Head: Normocephalic and atraumatic.  Nose: Nose normal.  Mouth/Throat: Oropharynx is  clear and moist. No oropharyngeal exudate.  Eyes: Pupils are equal, round, and reactive to light. Conjunctivae and  EOM are normal. Left eye exhibits no discharge. No scleral icterus.  Neck: Normal range of motion. Neck supple. No JVD present.  Cardiovascular: Normal rate and regular rhythm.  Murmur heard. Pulmonary/Chest: Effort normal and breath sounds normal. No respiratory distress. She has no wheezes. She has no rales. She exhibits no tenderness.  Abdominal: Soft. Bowel sounds are normal. She exhibits no distension and no mass. There is no tenderness. There is no rebound and no guarding.  Musculoskeletal: Normal range of motion. She exhibits no edema, tenderness or deformity.  Lymphadenopathy:    She has no cervical adenopathy.  Neurological: She is alert and oriented to person, place, and time. No cranial nerve deficit. She exhibits normal muscle tone. Coordination normal.  Skin: Skin is warm and dry. She is not diaphoretic. No erythema.  Psychiatric: Affect and judgment normal.    CMP Latest Ref Rng & Units 03/09/2018  Glucose 70 - 99 mg/dL 105(H)  BUN 8 - 23 mg/dL 12  Creatinine 0.44 - 1.00 mg/dL 0.69  Sodium 135 - 145 mmol/L 142  Potassium 3.5 - 5.1 mmol/L 3.1(L)  Chloride 98 - 111 mmol/L 108  CO2 22 - 32 mmol/L 28  Calcium 8.9 - 10.3 mg/dL 9.1  Total Protein 6.5 - 8.1 g/dL 6.7  Total Bilirubin 0.3 - 1.2 mg/dL 0.5  Alkaline Phos 38 - 126 U/L 57  AST 15 - 41 U/L 31  ALT 0 - 44 U/L 20   CBC Latest Ref Rng & Units 03/23/2018  WBC 4.0 - 10.5 K/uL 3.5(L)  Hemoglobin 12.0 - 15.0 g/dL 11.8(L)  Hematocrit 36.0 - 46.0 % 37.7  Platelets 150 - 400 K/uL 200    RADIOGRAPHIC STUDIES: I have personally reviewed the radiological images as listed and agreed with the findings in the report.  PET scan 01/23/2018  Interval development of loculated ascites within the upper abdomen which overlies the liver and spleen and exhibits increased radiotracer uptake. Findings worrisome for  peritoneal carcinomatosis. Consider further evaluation with diagnostic paracentesis. 2. Soft tissue infiltration within the omentum with increased radiotracer uptake noted. Also worrisome for peritoneal carcinomatosis. 3. New hypermetabolic internal mammary lymph nodes and right CP angle lymph node. Suspicious for metastatic adenopathy. 4. Mild FDG uptake is identified localizing to the lower third of the esophagus within SUV max of 5.15.  Assessment and plan  Cancer Staging Malignant neoplasm of lower third of esophagus [facility]) Staging form: Esophagus - Adenocarcinoma, AJCC 8th Edition - Clinical stage from 04/10/2017: Stage IVB (cTX, cNX, pM1) - Signed by Earlie Server, MD on 04/10/2017  1. Encounter for antineoplastic chemotherapy   2. Esophageal stricture   3. Neuropathy    #Esophageal cancer, progressed.  Currently on second line treatment with Cyramza and paclitaxel.   Tolerates well.  Labs reviewed and discussed with patient.  Acceptable counts to proceed with paclitaxel and Cyramza.  Taxol dose will go back to 47m/m2 [last cycle dose was temporarily reduced due to acute sinusitis infection] She will follow-up to have Taxol next week.  #  Chronic neuropathy: Continue Lyrica.  Stable symptoms.   # Sinusitis, continue Augmentin.  Symptoms improved, not completely resolved yet.  Her course of antibiotics has been extended by PCP.  # Hypokalemia, advised patient to continue take oral potassium 20 mEq daily.  Potassium level today has improved. #Constipation continue stool softener. #Mucositis, continue Magic mouthwash.  I will also send patient a prescription of Peridex to reduce oral bacterial load.  Follow-up in 2 weeks for assessment prior to  Day 15 Cyramza and paclitaxel. We spent sufficient time to discuss many aspect of care, questions were answered to patient's satisfaction.   Earlie Server, MD, PhD Hematology Oncology [facility] Pager-  [phone] 03/23/18

## 2018-03-23 NOTE — Progress Notes (Signed)
Patient here today for follow up.  Patient states no new concerns today  

## 2018-03-23 NOTE — Progress Notes (Signed)
Taxol dose 80mg /m2 today (last dose was 65mg /m2).  Proceed with 80mg /m2 per MD>

## 2018-03-24 LAB — CEA: CEA1: 3.9 ng/mL (ref 0.0–4.7)

## 2018-03-30 ENCOUNTER — Inpatient Hospital Stay: Payer: Medicare Other

## 2018-03-30 VITALS — BP 125/76 | HR 66 | Temp 97.5°F | Resp 18 | Wt 207.0 lb

## 2018-03-30 DIAGNOSIS — C155 Malignant neoplasm of lower third of esophagus: Secondary | ICD-10-CM | POA: Diagnosis not present

## 2018-03-30 DIAGNOSIS — Z5111 Encounter for antineoplastic chemotherapy: Secondary | ICD-10-CM

## 2018-03-30 LAB — CBC WITH DIFFERENTIAL/PLATELET
Abs Immature Granulocytes: 0.15 10*3/uL — ABNORMAL HIGH (ref 0.00–0.07)
Basophils Absolute: 0.1 10*3/uL (ref 0.0–0.1)
Basophils Relative: 1 %
EOS ABS: 0.1 10*3/uL (ref 0.0–0.5)
EOS PCT: 2 %
HCT: 36.1 % (ref 36.0–46.0)
HEMOGLOBIN: 11.5 g/dL — AB (ref 12.0–15.0)
Immature Granulocytes: 3 %
LYMPHS PCT: 12 %
Lymphs Abs: 0.7 10*3/uL (ref 0.7–4.0)
MCH: 32.4 pg (ref 26.0–34.0)
MCHC: 31.9 g/dL (ref 30.0–36.0)
MCV: 101.7 fL — AB (ref 80.0–100.0)
MONOS PCT: 8 %
Monocytes Absolute: 0.5 10*3/uL (ref 0.1–1.0)
Neutro Abs: 4.3 10*3/uL (ref 1.7–7.7)
Neutrophils Relative %: 74 %
Platelets: 201 10*3/uL (ref 150–400)
RBC: 3.55 MIL/uL — ABNORMAL LOW (ref 3.87–5.11)
RDW: 14.7 % (ref 11.5–15.5)
WBC: 5.8 10*3/uL (ref 4.0–10.5)
nRBC: 0 % (ref 0.0–0.2)

## 2018-03-30 LAB — COMPREHENSIVE METABOLIC PANEL
ALBUMIN: 3.5 g/dL (ref 3.5–5.0)
ALK PHOS: 57 U/L (ref 38–126)
ALT: 15 U/L (ref 0–44)
AST: 29 U/L (ref 15–41)
Anion gap: 7 (ref 5–15)
BUN: 14 mg/dL (ref 8–23)
CALCIUM: 8.9 mg/dL (ref 8.9–10.3)
CHLORIDE: 106 mmol/L (ref 98–111)
CO2: 29 mmol/L (ref 22–32)
CREATININE: 0.57 mg/dL (ref 0.44–1.00)
GFR calc Af Amer: >60 mL/min (ref >60)
GFR calc non Af Amer: >60 mL/min (ref >60)
GLUCOSE: 111 mg/dL — AB (ref 70–99)
Potassium: 3.8 mmol/L (ref 3.5–5.1)
Sodium: 142 mmol/L (ref 135–145)
Total Bilirubin: 0.4 mg/dL (ref 0.3–1.2)
Total Protein: 6.5 g/dL (ref 6.5–8.1)

## 2018-03-30 MED ORDER — SODIUM CHLORIDE 0.9 % IV SOLN
80.0000 mg/m2 | Freq: Once | INTRAVENOUS | Status: AC
  Administered 2018-03-30: 162 mg via INTRAVENOUS
  Filled 2018-03-30: qty 27

## 2018-03-30 MED ORDER — FAMOTIDINE IN NACL 20-0.9 MG/50ML-% IV SOLN: 20.0000 mg | Freq: Once | INTRAVENOUS | Status: DC

## 2018-03-30 MED ORDER — SODIUM CHLORIDE 0.9 % IV SOLN
Freq: Once | INTRAVENOUS | Status: AC
  Administered 2018-03-30: 10:00:00 via INTRAVENOUS
  Filled 2018-03-30: qty 250

## 2018-03-30 MED ORDER — HEPARIN SOD (PORK) LOCK FLUSH 100 UNIT/ML IV SOLN
500.0000 [IU] | Freq: Once | INTRAVENOUS | Status: AC | PRN
  Administered 2018-03-30: 500 [IU]
  Filled 2018-03-30: qty 5

## 2018-03-30 MED ORDER — DIPHENHYDRAMINE HCL 50 MG/ML IJ SOLN
50.0000 mg | Freq: Once | INTRAMUSCULAR | Status: AC
  Administered 2018-03-30: 50 mg via INTRAVENOUS
  Filled 2018-03-30: qty 1

## 2018-03-30 MED ORDER — DEXAMETHASONE SODIUM PHOSPHATE 10 MG/ML IJ SOLN
10.0000 mg | Freq: Once | INTRAMUSCULAR | Status: AC
  Administered 2018-03-30: 10 mg via INTRAVENOUS
  Filled 2018-03-30: qty 1

## 2018-03-30 MED ORDER — SODIUM CHLORIDE 0.9 % IV SOLN
Freq: Once | INTRAVENOUS | Status: AC
  Administered 2018-03-30: 10:00:00 via INTRAVENOUS
  Filled 2018-03-30: qty 50

## 2018-03-31 ENCOUNTER — Ambulatory Visit (INDEPENDENT_AMBULATORY_CARE_PROVIDER_SITE_OTHER): Payer: Medicare Other | Admitting: Family Medicine

## 2018-03-31 ENCOUNTER — Other Ambulatory Visit: Payer: Self-pay | Admitting: Oncology

## 2018-03-31 ENCOUNTER — Encounter: Payer: Self-pay | Admitting: Family Medicine

## 2018-03-31 VITALS — BP 152/84 | HR 58 | Temp 98.0°F | Resp 14 | Ht 62.5 in | Wt 206.0 lb

## 2018-03-31 DIAGNOSIS — R0981 Nasal congestion: Secondary | ICD-10-CM

## 2018-03-31 LAB — CEA: CEA1: 3.1 ng/mL (ref 0.0–4.7)

## 2018-03-31 NOTE — Progress Notes (Addendum)
Subjective:     [name] is a 83 y.o. female presenting for Sinus Problem (Feels like she has a sinus infection since beginning of October. No fever. She has been using Flonase and Loratadine daily. She has taking Augmentin. Saw Dr Damita Dunnings amd Dr Glori Bickers for this issue and it is not resolving 100%.)     Sinusitis  This is a recurrent problem. The current episode started more than 1 month ago. The problem has been gradually improving since onset. There has been no fever. The pain is mild. Associated symptoms include congestion, headaches (improving) and sneezing. Pertinent negatives include no chills, coughing, diaphoresis, ear pain, hoarse voice, shortness of breath, sinus pressure, sore throat or swollen glands. Past treatments include antibiotics, spray decongestants, saline sprays and acetaminophen. The treatment provided moderate relief.   Sinus drainage was green with some blood, but improving today  Reports facial HA has improved  Improved symptoms - pain in head, general congestion is improving  Using Flonase - 2 spray  Using neti pot occasionally Continues to take allergy medication  Last day of Abx 03/25/18 -- Getting better since then    Esophageal Cancer  - currently getting Taxol for chemotherapy treatment  Chart review 03/04/2018: Clinic - sinusitis and treated with 10 d augmentin 03/18/2018: Clinic - immunocompromised cancer pt - extended augmentin x 7 days. Cont flonase, add saline   Review of Systems  Constitutional: Negative for chills and diaphoresis.  HENT: Positive for congestion and sneezing. Negative for ear pain, hoarse voice, sinus pressure and sore throat.   Respiratory: Negative for cough and shortness of breath.   Neurological: Positive for headaches (improving).      Social History   Tobacco Use  Smoking Status Former Smoker  . Packs/day: 1.00  . Years: 20.00  . Pack years: 20.00  . Types: Cigarettes  . Last attempt to quit: 1977    . Years since quitting: 42.9  Smokeless Tobacco Never Used        Objective:    BP Readings from Last 3 Encounters:  03/31/18 (!) 152/94  03/30/18 125/76  03/23/18 138/78   Wt Readings from Last 3 Encounters:  03/31/18 206 lb (93.4 kg)  03/30/18 207 lb (93.9 kg)  03/23/18 207 lb (93.9 kg)    BP (!) 152/94   Pulse (!) 58   Temp 98 F (36.7 C)   Resp 14   Ht 5' 2.5" (1.588 m)   Wt 206 lb (93.4 kg)   SpO2 97%   BMI 37.08 kg/m    Physical Exam  Constitutional: She appears well-developed and well-nourished. No distress.  HENT:  Head: Normocephalic and atraumatic.  Right Ear: Tympanic membrane and ear canal normal.  Left Ear: Tympanic membrane and ear canal normal.  Nose: Mucosal edema and rhinorrhea present. Right sinus exhibits no maxillary sinus tenderness and no frontal sinus tenderness. Left sinus exhibits no maxillary sinus tenderness and no frontal sinus tenderness.  Mouth/Throat: Uvula is midline and mucous membranes are normal. Posterior oropharyngeal erythema present. No oropharyngeal exudate. Tonsils are 0 on the right. Tonsils are 0 on the left.  Eyes: Conjunctivae and EOM are normal. No scleral icterus.  Neck: Neck supple.  Cardiovascular: Normal rate, regular rhythm and normal heart sounds.  No murmur heard. Pulmonary/Chest: Effort normal and breath sounds normal. No respiratory distress.  Lymphadenopathy:    She has no cervical adenopathy.  Neurological: She is alert.  Skin: Skin is warm and dry. Capillary refill takes less than 2  seconds. She is not diaphoretic.  Psychiatric: She has a normal mood and affect.          Assessment & Plan:   Problem List Items Addressed This Visit    None    Visit Diagnoses    Sinus congestion    -  Primary     1 week out from course of Abx with resolution of sinus pressure and HA, no fevers  Discussed that it may take time for congestion to clear due to immunocompromised state but do not feel further Abx are  necessary given overall improvement.   Patient Instructions   Sinus Congestion 1) Neti Pot (Saline rinse) -- 2 times day -- if tolerated - if you do not tolerate do the saline 2) Flonase (Store Brand ok) - once daily 3) Over the counter congestion medications  You are getting better off antibiotics - unfortunately you are not back to normal.    If you develop fevers (Temperature >100.4), chills, worsening symptoms return to clinic as you may need a different antibiotic or more testing.     Would consider course of Levofloxacin vs CT imaging if symptoms worsen or develop fevers   BP elevated today - recommend follow-up with PCP  Return if symptoms worsen or fail to improve.  Lesleigh Noe, MD

## 2018-03-31 NOTE — Patient Instructions (Signed)
  Sinus Congestion 1) Neti Pot (Saline rinse) -- 2 times day -- if tolerated - if you do not tolerate do the saline 2) Flonase (Store Brand ok) - once daily 3) Over the counter congestion medications  You are getting better off antibiotics - unfortunately you are not back to normal.    If you develop fevers (Temperature >100.4), chills, worsening symptoms return to clinic as you may need a different antibiotic or more testing.

## 2018-04-06 ENCOUNTER — Inpatient Hospital Stay: Payer: Medicare Other

## 2018-04-06 ENCOUNTER — Inpatient Hospital Stay (HOSPITAL_BASED_OUTPATIENT_CLINIC_OR_DEPARTMENT_OTHER): Payer: Medicare Other | Admitting: Oncology

## 2018-04-06 ENCOUNTER — Other Ambulatory Visit: Payer: Self-pay

## 2018-04-06 ENCOUNTER — Encounter: Payer: Self-pay | Admitting: Oncology

## 2018-04-06 VITALS — BP 133/74 | HR 68 | Resp 18

## 2018-04-06 VITALS — BP 115/71 | HR 75 | Temp 98.0°F | Wt 203.4 lb

## 2018-04-06 DIAGNOSIS — I10 Essential (primary) hypertension: Secondary | ICD-10-CM

## 2018-04-06 DIAGNOSIS — C155 Malignant neoplasm of lower third of esophagus: Secondary | ICD-10-CM

## 2018-04-06 DIAGNOSIS — K1230 Oral mucositis (ulcerative), unspecified: Secondary | ICD-10-CM

## 2018-04-06 DIAGNOSIS — Z87891 Personal history of nicotine dependence: Secondary | ICD-10-CM

## 2018-04-06 DIAGNOSIS — R1319 Other dysphagia: Secondary | ICD-10-CM

## 2018-04-06 DIAGNOSIS — K5900 Constipation, unspecified: Secondary | ICD-10-CM

## 2018-04-06 DIAGNOSIS — R1012 Left upper quadrant pain: Secondary | ICD-10-CM | POA: Diagnosis not present

## 2018-04-06 DIAGNOSIS — C787 Secondary malignant neoplasm of liver and intrahepatic bile duct: Secondary | ICD-10-CM | POA: Diagnosis not present

## 2018-04-06 DIAGNOSIS — K121 Other forms of stomatitis: Secondary | ICD-10-CM

## 2018-04-06 DIAGNOSIS — J329 Chronic sinusitis, unspecified: Secondary | ICD-10-CM

## 2018-04-06 DIAGNOSIS — J449 Chronic obstructive pulmonary disease, unspecified: Secondary | ICD-10-CM

## 2018-04-06 DIAGNOSIS — K219 Gastro-esophageal reflux disease without esophagitis: Secondary | ICD-10-CM

## 2018-04-06 DIAGNOSIS — Z85828 Personal history of other malignant neoplasm of skin: Secondary | ICD-10-CM

## 2018-04-06 DIAGNOSIS — R5383 Other fatigue: Secondary | ICD-10-CM

## 2018-04-06 DIAGNOSIS — R5381 Other malaise: Secondary | ICD-10-CM

## 2018-04-06 DIAGNOSIS — Z923 Personal history of irradiation: Secondary | ICD-10-CM | POA: Diagnosis not present

## 2018-04-06 DIAGNOSIS — G629 Polyneuropathy, unspecified: Secondary | ICD-10-CM

## 2018-04-06 DIAGNOSIS — Z85820 Personal history of malignant melanoma of skin: Secondary | ICD-10-CM

## 2018-04-06 DIAGNOSIS — Z5111 Encounter for antineoplastic chemotherapy: Secondary | ICD-10-CM

## 2018-04-06 DIAGNOSIS — Z79899 Other long term (current) drug therapy: Secondary | ICD-10-CM

## 2018-04-06 DIAGNOSIS — E786 Lipoprotein deficiency: Secondary | ICD-10-CM

## 2018-04-06 DIAGNOSIS — R634 Abnormal weight loss: Secondary | ICD-10-CM

## 2018-04-06 DIAGNOSIS — M1990 Unspecified osteoarthritis, unspecified site: Secondary | ICD-10-CM

## 2018-04-06 LAB — COMPREHENSIVE METABOLIC PANEL
ALK PHOS: 66 U/L (ref 38–126)
ALT: 22 U/L (ref 0–44)
AST: 33 U/L (ref 15–41)
Albumin: 3.7 g/dL (ref 3.5–5.0)
Anion gap: 9 (ref 5–15)
BUN: 12 mg/dL (ref 8–23)
CALCIUM: 8.9 mg/dL (ref 8.9–10.3)
CO2: 28 mmol/L (ref 22–32)
CREATININE: 0.64 mg/dL (ref 0.44–1.00)
Chloride: 107 mmol/L (ref 98–111)
Glucose, Bld: 177 mg/dL — ABNORMAL HIGH (ref 70–99)
Potassium: 3.6 mmol/L (ref 3.5–5.1)
Sodium: 144 mmol/L (ref 135–145)
Total Bilirubin: 0.6 mg/dL (ref 0.3–1.2)
Total Protein: 6.9 g/dL (ref 6.5–8.1)

## 2018-04-06 LAB — CBC WITH DIFFERENTIAL/PLATELET
Abs Immature Granulocytes: 0.03 10*3/uL (ref 0.00–0.07)
BASOS ABS: 0 10*3/uL (ref 0.0–0.1)
BASOS PCT: 1 %
EOS ABS: 0.1 10*3/uL (ref 0.0–0.5)
EOS PCT: 3 %
HCT: 37.3 % (ref 36.0–46.0)
Hemoglobin: 11.9 g/dL — ABNORMAL LOW (ref 12.0–15.0)
Immature Granulocytes: 1 %
Lymphocytes Relative: 20 %
Lymphs Abs: 0.7 10*3/uL (ref 0.7–4.0)
MCH: 32.6 pg (ref 26.0–34.0)
MCHC: 31.9 g/dL (ref 30.0–36.0)
MCV: 102.2 fL — ABNORMAL HIGH (ref 80.0–100.0)
Monocytes Absolute: 0.2 10*3/uL (ref 0.1–1.0)
Monocytes Relative: 7 %
NRBC: 0 % (ref 0.0–0.2)
Neutro Abs: 2.4 10*3/uL (ref 1.7–7.7)
Neutrophils Relative %: 68 %
Platelets: 226 10*3/uL (ref 150–400)
RBC: 3.65 MIL/uL — AB (ref 3.87–5.11)
RDW: 14.9 % (ref 11.5–15.5)
WBC: 3.5 10*3/uL — AB (ref 4.0–10.5)

## 2018-04-06 LAB — URINALYSIS, DIPSTICK ONLY
Bilirubin Urine: NEGATIVE
Glucose, UA: NEGATIVE mg/dL
Hgb urine dipstick: NEGATIVE
Ketones, ur: NEGATIVE mg/dL
NITRITE: NEGATIVE
PROTEIN: NEGATIVE mg/dL
Specific Gravity, Urine: 1.015 (ref 1.005–1.030)
pH: 5 (ref 5.0–8.0)

## 2018-04-06 MED ORDER — SODIUM CHLORIDE 0.9 % IV SOLN
Freq: Once | INTRAVENOUS | Status: AC
  Administered 2018-04-06: 10:00:00 via INTRAVENOUS
  Filled 2018-04-06: qty 250

## 2018-04-06 MED ORDER — SODIUM CHLORIDE 0.9% FLUSH
10.0000 mL | Freq: Once | INTRAVENOUS | Status: DC
  Filled 2018-04-06: qty 10

## 2018-04-06 MED ORDER — HEPARIN SOD (PORK) LOCK FLUSH 100 UNIT/ML IV SOLN
500.0000 [IU] | Freq: Once | INTRAVENOUS | Status: AC | PRN
  Administered 2018-04-06: 500 [IU]

## 2018-04-06 MED ORDER — FAMOTIDINE IN NACL 20-0.9 MG/50ML-% IV SOLN
20.0000 mg | Freq: Once | INTRAVENOUS | Status: AC
  Administered 2018-04-06: 20 mg via INTRAVENOUS
  Filled 2018-04-06: qty 50

## 2018-04-06 MED ORDER — DIPHENHYDRAMINE HCL 50 MG/ML IJ SOLN
50.0000 mg | Freq: Once | INTRAMUSCULAR | Status: AC
  Administered 2018-04-06: 50 mg via INTRAVENOUS
  Filled 2018-04-06: qty 1

## 2018-04-06 MED ORDER — HEPARIN SOD (PORK) LOCK FLUSH 100 UNIT/ML IV SOLN
500.0000 [IU] | Freq: Once | INTRAVENOUS | Status: DC
  Filled 2018-04-06: qty 5

## 2018-04-06 MED ORDER — SODIUM CHLORIDE 0.9 % IV SOLN
80.0000 mg/m2 | Freq: Once | INTRAVENOUS | Status: AC
  Administered 2018-04-06: 162 mg via INTRAVENOUS
  Filled 2018-04-06: qty 27

## 2018-04-06 MED ORDER — ACETAMINOPHEN 325 MG PO TABS
650.0000 mg | ORAL_TABLET | Freq: Once | ORAL | Status: AC
  Administered 2018-04-06: 650 mg via ORAL
  Filled 2018-04-06: qty 2

## 2018-04-06 MED ORDER — SODIUM CHLORIDE 0.9 % IV SOLN
8.0000 mg/kg | Freq: Once | INTRAVENOUS | Status: AC
  Administered 2018-04-06: 700 mg via INTRAVENOUS
  Filled 2018-04-06: qty 70

## 2018-04-06 MED ORDER — DEXAMETHASONE SODIUM PHOSPHATE 10 MG/ML IJ SOLN
10.0000 mg | Freq: Once | INTRAMUSCULAR | Status: AC
  Administered 2018-04-06: 10 mg via INTRAVENOUS
  Filled 2018-04-06: qty 1

## 2018-04-06 NOTE — Progress Notes (Signed)
Hematology/Oncology Follow Up Note [facility]  Telephone:([phone] [phone]  Patient Care Team: Tonia Ghent, MD as PCP - General (Family Medicine)   Name of the patient: [name]  [id number]  20-Sep-1934   REASON FOR VISIT Follow up of chemotherapy management of esophageal adenocarcinoma  HISTORY OF PRESENT ILLNESS Patient is a 83 year old female who has metastatic esophageal cancer with liver involvement.  Weight loss 20 pounds for the past year prior to diagnosis.  # EGD during admission showed partially obstructive esophageal mass at the distal part of esophagus. Biopsy was taken. Pathology showed esophageal adenocarcinoma. #US biopsy of liver lesion to proof distant metastasis.  Report family history of breast cancer, but she has been having routine mammograms.  # Lives alone. She's a former smoker.  # Molecular Testing:  #[employer] Cdx: No reportable alternations with companion diagnostic (CDx) claims.  MS stable Tumor mutation burden: Cannot be determined, CCND3 amplification: Equivocal.  Amended reports on 06/04/2017, Tumor mutational burden changed from can not be determined to "TMB -low" on the re-analyzed samples.  # HER 2 negative.   Current Treatment S/p 6 cycles of FOLFOX, PET scan showed excellent partial response from both chemotherapy and radiation, result was discussed with patient.  currently on 5-FU maintenance.  S/p palliative radiation in January 2019 # Developed esophageal stricture secondary to radiation in April and got dilation via endoscopy. EGD shoed Benign-appearing esophageal stenosis. This is a result of radiation therapy.- LA Grade C radiation esophagitis. - The previously noted esophageal mass in Nov 2018 was much decreased indicating good response to radiation/chemotherapy. - Normal stomach.- Normal examined jejunum.- No specimens collected.    INTERVAL HISTORY  Patient with above history presents  for assessment prior to second line chemotherapy for treatment of metastatic esophageal cancer.  #Chemotherapy, reports tolerates well.  Denies any nausea, vomiting or diarrhea. #Mucositis, reports that her tongue and mouth has burning sensation when drinking cold water. #Occasional abdominal pain, left upper quadrant, relieved spontaneously. #Chronic neuropathy, stable at baseline.  Takes Lyrica. #Sinusitis, symptoms have improved.  Finished Augmentin. #Dysphagia, no swallowing difficulty so far. #Constipation, improved while taking stool softener  Review of systems Review of Systems  Constitutional: Positive for malaise/fatigue and weight loss. Negative for chills, diaphoresis and fever.  HENT: Negative for ear discharge, ear pain, hearing loss, nosebleeds and sore throat.        Mouth sore resolved.  Eyes: Negative for double vision, photophobia, pain, discharge and redness.  Respiratory: Negative for cough, hemoptysis, shortness of breath and wheezing.   Cardiovascular: Negative for chest pain, palpitations, orthopnea, claudication and leg swelling.  Gastrointestinal: Negative for abdominal pain, blood in stool, constipation, diarrhea, heartburn, melena, nausea and vomiting.       Dysphagia is better  Genitourinary: Negative for dysuria, flank pain, frequency, hematuria and urgency.  Musculoskeletal: Negative for back pain, joint pain, myalgias and neck pain.  Skin: Negative for itching and rash.  Neurological: Positive for tingling and sensory change. Negative for dizziness, tremors, focal weakness, weakness and headaches.  Endo/Heme/Allergies: Negative for environmental allergies. Does not bruise/bleed easily.  Psychiatric/Behavioral: Negative for depression, hallucinations, substance abuse and suicidal ideas. The patient is not nervous/anxious.     Allergies  Allergen Reactions  . Cefuroxime Nausea Only  . Codeine Nausea Only  . Doxycycline Other (See Comments)    Lip swelling   . Gabapentin Other (See Comments)    Swelling.   . Iohexol Itching    07-15-13 pt developed  itching on fingers after contrast given. Dr. Irish Elders looked at pt and said to put in system as allergy. BB  . Other Other (See Comments)    Contrast Dye- caused fever, chills, awful feeling Bandages - itching, rash  . Oxycodone Other (See Comments)    nausea  . Quinolones Swelling    Lip swelling  . Synvisc [Hylan G-F 20] Swelling     Past Medical History:  Diagnosis Date  . Arthritis   . Asthma   . COPD (chronic obstructive pulmonary disease) ([place])   . Dysphonia   . Dyspnea   . Esophageal cancer ([place])   . GERD (gastroesophageal reflux disease)   . Heart murmur   . History of kidney stones   . Hypertension   . Hypokalemia 10/07/2017  . Melanoma ([place])   . OAB (overactive bladder)   . OSA on CPAP   . 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 Personal history of chemotherapy    esophageal cancer  . Personal history of radiation therapy    esophageal cancer  . Pupil asymmetry    R pupil defect  . Skin cancer   . Sleep apnea   . Spinal cord abscess      Past Surgical History:  Procedure Laterality Date  . ABDOMINAL HYSTERECTOMY    . ANTERIOR CERVICAL DECOMP/DISCECTOMY FUSION N/A 07/16/2013   Procedure: ANTERIOR CERVICAL DECOMPRESSION/DISCECTOMY FUSION mutlipleLEVELS C4-7;  Surgeon: Erline Levine, MD;  Location: [place] [facility]UR[facility]S;  Service: Neurosurgery;  Laterality: N/A;  . APPENDECTOMY    . carpel tunn Bilateral   . CATARACT EXTRACTION    . CATARACT EXTRACTION, BILATERAL    . CHOLECYSTECTOMY    . dental implant    . ESOPHAGOGASTRODUODENOSCOPY Left 03/21/2017   Procedure: ESOPHAGOGASTRODUODENOSCOPY (EGD);  Surgeon: Virgel Manifold, MD;  Location: [facility] ENDOSCOPY;  Service: Endoscopy;  Laterality: Left;  . ESOPHAGOGASTRODUODENOSCOPY (EGD) WITH PROPOFOL N/A 08/20/2017   Procedure: ESOPHAGOGASTRODUODENOSCOPY (EGD) WITH PROPOFOL;  Surgeon: Virgel Manifold, MD;   Location: [facility] ENDOSCOPY;  Service: Endoscopy;  Laterality: N/A;  . ESOPHAGOGASTRODUODENOSCOPY (EGD) WITH PROPOFOL N/A 09/19/2017   Procedure: ESOPHAGOGASTRODUODENOSCOPY (EGD) WITH PROPOFOL;  Surgeon: Virgel Manifold, MD;  Location: [facility] ENDOSCOPY;  Service: Endoscopy;  Laterality: N/A;  . ESOPHAGOGASTRODUODENOSCOPY (EGD) WITH PROPOFOL N/A 10/03/2017   Procedure: ESOPHAGOGASTRODUODENOSCOPY (EGD) WITH PROPOFOL;  Surgeon: Virgel Manifold, MD;  Location: [facility] ENDOSCOPY;  Service: Endoscopy;  Laterality: N/A;  . ESOPHAGOGASTRODUODENOSCOPY (EGD) WITH PROPOFOL N/A 10/22/2017   Procedure: ESOPHAGOGASTRODUODENOSCOPY (EGD) WITH PROPOFOL;  Surgeon: Virgel Manifold, MD;  Location: [facility] ENDOSCOPY;  Service: Endoscopy;  Laterality: N/A;  . ESOPHAGOGASTRODUODENOSCOPY (EGD) WITH PROPOFOL N/A 11/18/2017   Procedure: ESOPHAGOGASTRODUODENOSCOPY (EGD) WITH PROPOFOL;  Surgeon: Virgel Manifold, MD;  Location: [facility] ENDOSCOPY;  Service: Endoscopy;  Laterality: N/A;  . ESOPHAGOGASTRODUODENOSCOPY (EGD) WITH PROPOFOL N/A 12/17/2017   Procedure: ESOPHAGOGASTRODUODENOSCOPY (EGD) WITH PROPOFOL;  Surgeon: Virgel Manifold, MD;  Location: [facility] ENDOSCOPY;  Service: Endoscopy;  Laterality: N/A;  . ESOPHAGOGASTRODUODENOSCOPY (EGD) WITH PROPOFOL N/A 01/08/2018   Procedure: ESOPHAGOGASTRODUODENOSCOPY (EGD) WITH PROPOFOL;  Surgeon: Virgel Manifold, MD;  Location: [facility] ENDOSCOPY;  Service: Endoscopy;  Laterality: N/A;  . ESOPHAGOGASTRODUODENOSCOPY (EGD) WITH PROPOFOL N/A 01/22/2018   Procedure: ESOPHAGOGASTRODUODENOSCOPY (EGD) WITH PROPOFOL;  Surgeon: Virgel Manifold, MD;  Location: [facility] ENDOSCOPY;  Service: Endoscopy;  Laterality: N/A;  . EYE SURGERY    . GASTRIC BYPASS    . HERNIA REPAIR    . HIATAL HERNIA REPAIR    . JOINT  REPLACEMENT    . MELANOMA EXCISION    . PORTA CATH INSERTION N/A 04/07/2017   Procedure: PORTA CATH INSERTION;  Surgeon: Algernon Huxley, MD;  Location: [place] CV LAB;  Service:  Cardiovascular;  Laterality: N/A;  . POSTERIOR CERVICAL FUSION/FORAMINOTOMY N/A 07/28/2013   Procedure: Cervical four-seven  posterior cervical fusion;  Surgeon: Erline Levine, MD;  Location: [place] [facility]URO ORS;  Service: Neurosurgery;  Laterality: N/A;  . TONSILLECTOMY    . TOTAL KNEE ARTHROPLASTY      Social History   Socioeconomic History  . Marital status: Widowed    Spouse name: Not on file  . Number of children: 2  . Years of education: Not on file  . Highest education level: Master's degree (e.g., MA, MS, MEng, MEd, MSW, MBA)  Occupational History  . Not on file  Social Needs  . Financial resource strain: Not hard at all  . Food insecurity:    Worry: Never true    Inability: Never true  . Transportation needs:    Medical: No    Non-medical: No  Tobacco Use  . Smoking status: Former Smoker    Packs/day: 1.00    Years: 20.00    Pack years: 20.00    Types: Cigarettes    Last attempt to quit: 1977    Years since quitting: 42.9  . Smokeless tobacco: Never Used  Substance and Sexual Activity  . Alcohol use: No  . Drug use: Never  . Sexual activity: Never  Lifestyle  . Physical activity:    Days per week: 2 days    Minutes per session: 30 min  . Stress: Not at all  Relationships  . Social connections:    Talks on phone: More than three times a week    Gets together: More than three times a week    Attends religious service: More than 4 times per year    Active member of club or organization: Yes    Attends meetings of clubs or organizations: More than 4 times per year    Relationship status: Widowed  . Intimate partner violence:    Fear of current or ex partner: No    Emotionally abused: No    Physically abused: No    Forced sexual activity: No  Other Topics Concern  . Not on file  Social History Narrative   Marital Status- Widowed    Lives by herself    Employement- Retired Pharmacist, hospital   Exercise hx- Does PT     Family History  Problem Relation Age of Onset  .  Breast cancer Mother 48  . Arthritis-Osteo Mother   . Breast cancer Sister 57  . Bladder Cancer Sister   . Bladder Cancer Father   . Tongue cancer Father   . Congenital heart disease Father   . Prostate cancer Brother   . Uterine cancer Maternal Aunt   . Lung cancer Paternal Uncle   . Leukemia Maternal Grandmother   . Liver cancer Paternal Grandmother   . Colon cancer Neg Hx      Current Outpatient Medications:  .  acetaminophen (TYLENOL) 325 MG tablet, Take 650 mg by mouth every 6 (six) hours as needed for moderate pain or headache. , Disp: , Rfl:  .  ALBUTEROL SULFATE HFA IN, Inhale into the lungs. , Disp: , Rfl:  .  calcium carbonate (TUMS EX) 750 MG chewable tablet, Chew by mouth., Disp: , Rfl:  .  CARAFATE 1 GM/10ML suspension, TAKE 10MLS BY MOUTH FOUR  TIMES DAILY, Disp: 420 mL, Rfl: 1 .  chlorhexidine (PERIDEX) 0.12 % solution, Use as directed 15 mLs in the mouth or throat 2 (two) times daily., Disp: , Rfl:  .  Cholecalciferol (VITAMIN D) 2000 UNITS CAPS, Take 2,000 Units by mouth daily., Disp: , Rfl:  .  cyanocobalamin 500 MCG tablet, Take 500 mcg every other day by mouth., Disp: , Rfl:  .  Diclofenac Sodium 1 % CREA, Apply to left arm as needed for pain three times daily, Disp: 120 g, Rfl: 0 .  dronabinol (MARINOL) 5 MG capsule, Take 1 capsule (5 mg total) by mouth 2 (two) times daily before a meal., Disp: 60 capsule, Rfl: 0 .  fluticasone (FLONASE) 50 MCG/ACT nasal spray, Place 1-2 sprays into both nostrils daily., Disp: 16 g, Rfl: 5 .  furosemide (LASIX) 20 MG tablet, Take 1 tablet (20 mg total) by mouth daily., Disp: 90 tablet, Rfl: 3 .  hydrocortisone cream 1 %, Apply 1 application topically daily as needed for itching. , Disp: , Rfl:  .  loratadine (CLARITIN) 10 MG tablet, Take 10 mg by mouth daily., Disp: , Rfl:  .  LYRICA 150 MG capsule, TAKE 1 CAPSULE BY MOUTH TWO TIMES DAILY, Disp: 180 capsule, Rfl: 1 .  metoprolol succinate (TOPROL-XL) 50 MG 24 hr tablet, TAKE 1  TABLET BY MOUTH DAILY, Disp: 90 tablet, Rfl: 1 .  mirabegron ER (MYRBETRIQ) 25 MG TB24 tablet, Take 1 tablet (25 mg total) by mouth daily., Disp: 30 tablet, Rfl: 2 .  Multiple Vitamins-Minerals (MULTIVITAMIN ADULT) CHEW, Chew by mouth daily., Disp: , Rfl:  .  pantoprazole (PROTONIX) 40 MG tablet, TAKE ONE TABLET TWICE DAILY BEFORE A MEAL, Disp: 60 tablet, Rfl: 1 .  Polyethyl Glycol-Propyl Glycol (SYSTANE OP), Apply 1 drop to eye daily as needed (dry eyes)., Disp: , Rfl:  .  potassium chloride (KLOR-CON) 20 MEQ packet, Take 20 mEq by mouth daily., Disp: 30 packet, Rfl: 1 .  pyridOXINE (VITAMIN B-6) 100 MG tablet, Take 1 tablet (100 mg total) by mouth daily., Disp: 30 tablet, Rfl: 3 .  senna-docusate (SENOKOT-S) 8.6-50 MG tablet, Take 1 tablet at bedtime as needed by mouth for mild constipation., Disp: , Rfl:  .  traMADol (ULTRAM) 50 MG tablet, Take 1 tablet (50 mg total) by mouth every 6 (six) hours as needed., Disp: 30 tablet, Rfl: 0 .  traZODone (DESYREL) 50 MG tablet, TAKE 1/2 TABLET BY MOUTH AT BEDTIME IF NEEDED FOR SLEEP, Disp: 45 tablet, Rfl: 3 No current facility-administered medications for this visit.   Facility-Administered Medications Ordered in Other Visits:  .  heparin lock flush 100 unit/mL, 500 Units, Intravenous, Once, Earlie Server, MD .  heparin lock flush 100 unit/mL, 500 Units, Intracatheter, Once PRN, Earlie Server, MD .  PACLitaxel (TAXOL) 162 mg in sodium chloride 0.9 % 250 mL chemo infusion (</= 37m/m2), 80 mg/m2 (Treatment Plan Recorded), Intravenous, Once, YEarlie Server MD .  ramucirumab ([facility] 700 mg in sodium chloride 0.9 % 180 mL chemo infusion, 8 mg/kg (Treatment Plan Recorded), Intravenous, Once, YEarlie Server MD, Stopped at 04/06/18 1141 .  sodium chloride flush (NS) 0.9 % injection 10 mL, 10 mL, Intravenous, PRN, YEarlie Server MD, 10 mL at 01/26/18 0904 .  sodium chloride flush (NS) 0.9 % injection 10 mL, 10 mL, Intravenous, Once, YEarlie Server MD  Physical exam:  Vitals:   04/06/18  0940  BP: 115/71  Pulse: 75  Temp: 98 F (36.7 C)  TempSrc: Tympanic  Weight: 203 lb  6 oz (92.3 kg)  ECOG 1 Physical Exam  Constitutional: She is oriented to person, place, and time. No distress.  HENT:  Head: Normocephalic and atraumatic.  Nose: Nose normal.  Mouth/Throat: Oropharynx is clear and moist. No oropharyngeal exudate.  Eyes: Pupils are equal, round, and reactive to light. Conjunctivae and EOM are normal. Left eye exhibits no discharge. No scleral icterus.  Neck: Normal range of motion. Neck supple. No JVD present.  Cardiovascular: Normal rate and regular rhythm.  Murmur heard. Pulmonary/Chest: Effort normal and breath sounds normal. No respiratory distress. She has no wheezes. She has no rales. She exhibits no tenderness.  Abdominal: Soft. Bowel sounds are normal. She exhibits no distension. There is no tenderness.  Musculoskeletal: Normal range of motion. She exhibits no edema, tenderness or deformity.  Lymphadenopathy:    She has no cervical adenopathy.  Neurological: She is alert and oriented to person, place, and time. No cranial nerve deficit. She exhibits normal muscle tone. Coordination normal.  Skin: Skin is warm and dry. She is not diaphoretic. No erythema.  Psychiatric: Affect and judgment normal.    CMP Latest Ref Rng & Units 04/06/2018  Glucose 70 - 99 mg/dL 177(H)  BUN 8 - 23 mg/dL 12  Creatinine 0.44 - 1.00 mg/dL 0.64  Sodium 135 - 145 mmol/L 144  Potassium 3.5 - 5.1 mmol/L 3.6  Chloride 98 - 111 mmol/L 107  CO2 22 - 32 mmol/L 28  Calcium 8.9 - 10.3 mg/dL 8.9  Total Protein 6.5 - 8.1 g/dL 6.9  Total Bilirubin 0.3 - 1.2 mg/dL 0.6  Alkaline Phos 38 - 126 U/L 66  AST 15 - 41 U/L 33  ALT 0 - 44 U/L 22   CBC Latest Ref Rng & Units 04/06/2018  WBC 4.0 - 10.5 K/uL 3.5(L)  Hemoglobin 12.0 - 15.0 g/dL 11.9(L)  Hematocrit 36.0 - 46.0 % 37.3  Platelets 150 - 400 K/uL 226    RADIOGRAPHIC STUDIES: I have personally reviewed the radiological images as  listed and agreed with the findings in the report.  PET scan 01/23/2018  Interval development of loculated ascites within the upper abdomen which overlies the liver and spleen and exhibits increased radiotracer uptake. Findings worrisome for peritoneal carcinomatosis. Consider further evaluation with diagnostic paracentesis. 2. Soft tissue infiltration within the omentum with increased radiotracer uptake noted. Also worrisome for peritoneal carcinomatosis. 3. New hypermetabolic internal mammary lymph nodes and right CP angle lymph node. Suspicious for metastatic adenopathy. 4. Mild FDG uptake is identified localizing to the lower third of the esophagus within SUV max of 5.15.  Assessment and plan  Cancer Staging Malignant neoplasm of lower third of esophagus [facility]) Staging form: Esophagus - Adenocarcinoma, AJCC 8th Edition - Clinical stage from 04/10/2017: Stage IVB (cTX, cNX, pM1) - Signed by Earlie Server, MD on 04/10/2017  1. Malignant neoplasm of lower third of esophagus (HCC)   2. Encounter for antineoplastic chemotherapy   3. Neuropathy   4. Stomatitis and mucositis    #Esophageal cancer, Currently on second line treatment with Cyramza and paclitaxel.   Overall tolerates well.  Labs reviewed and discussed with patient. Acceptable counts to proceed with day 15 paclitaxel and Cyramza. Plan repeat image scan in December. CEA stable.  #  Chronic neuropathy: Continue Lyrica.  stable symptoms.   #Stomatitis and mucositis, continue Peridex mouthwash as instructed. #Occasional abdominal pain, suspect gas pain from constipation.  Continue to monitor closely.  Follow-up in 2 weeks for assessment prior to cycle 4 Cyramza and paclitaxel.  Total face to face encounter time for this patient visit was 25 min. >50% of the time was  spent in counseling and coordination of care.   Earlie Server, MD, PhD Hematology Oncology [facility] Pager- [phone] 04/06/18

## 2018-04-06 NOTE — Progress Notes (Signed)
Patient here today for follow up and chemo therapy.  Patient c/o occasional abdominal pains, relieved by eating.  C/o burning in mouth when drinking water

## 2018-04-07 LAB — CEA: CEA: 3.2 ng/mL (ref 0.0–4.7)

## 2018-04-12 ENCOUNTER — Other Ambulatory Visit: Payer: Self-pay

## 2018-04-12 ENCOUNTER — Emergency Department: Payer: Medicare Other

## 2018-04-12 ENCOUNTER — Encounter: Payer: Self-pay | Admitting: *Deleted

## 2018-04-12 ENCOUNTER — Inpatient Hospital Stay
Admission: EM | Admit: 2018-04-12 | Discharge: 2018-04-14 | DRG: 871 | Disposition: A | Discharge status: Home Health | Payer: Medicare Other | Attending: Internal Medicine | Admitting: Internal Medicine

## 2018-04-12 DIAGNOSIS — Z66 Do not resuscitate: Secondary | ICD-10-CM | POA: Diagnosis present

## 2018-04-12 DIAGNOSIS — Z85820 Personal history of malignant melanoma of skin: Secondary | ICD-10-CM

## 2018-04-12 DIAGNOSIS — R7989 Other specified abnormal findings of blood chemistry: Secondary | ICD-10-CM

## 2018-04-12 DIAGNOSIS — Z9989 Dependence on other enabling machines and devices: Secondary | ICD-10-CM | POA: Diagnosis not present

## 2018-04-12 DIAGNOSIS — G9341 Metabolic encephalopathy: Secondary | ICD-10-CM | POA: Diagnosis present

## 2018-04-12 DIAGNOSIS — Z91041 Radiographic dye allergy status: Secondary | ICD-10-CM | POA: Diagnosis not present

## 2018-04-12 DIAGNOSIS — K838 Other specified diseases of biliary tract: Secondary | ICD-10-CM | POA: Diagnosis present

## 2018-04-12 DIAGNOSIS — A419 Sepsis, unspecified organism: Principal | ICD-10-CM | POA: Diagnosis present

## 2018-04-12 DIAGNOSIS — Z981 Arthrodesis status: Secondary | ICD-10-CM

## 2018-04-12 DIAGNOSIS — G4733 Obstructive sleep apnea (adult) (pediatric): Secondary | ICD-10-CM | POA: Diagnosis present

## 2018-04-12 DIAGNOSIS — C155 Malignant neoplasm of lower third of esophagus: Secondary | ICD-10-CM | POA: Diagnosis not present

## 2018-04-12 DIAGNOSIS — D72819 Decreased white blood cell count, unspecified: Secondary | ICD-10-CM | POA: Diagnosis not present

## 2018-04-12 DIAGNOSIS — N390 Urinary tract infection, site not specified: Secondary | ICD-10-CM | POA: Diagnosis present

## 2018-04-12 DIAGNOSIS — Z87891 Personal history of nicotine dependence: Secondary | ICD-10-CM | POA: Diagnosis not present

## 2018-04-12 DIAGNOSIS — T451X5A Adverse effect of antineoplastic and immunosuppressive drugs, initial encounter: Secondary | ICD-10-CM | POA: Diagnosis present

## 2018-04-12 DIAGNOSIS — Z9884 Bariatric surgery status: Secondary | ICD-10-CM

## 2018-04-12 DIAGNOSIS — Z923 Personal history of irradiation: Secondary | ICD-10-CM

## 2018-04-12 DIAGNOSIS — Z881 Allergy status to other antibiotic agents status: Secondary | ICD-10-CM

## 2018-04-12 DIAGNOSIS — R945 Abnormal results of liver function studies: Secondary | ICD-10-CM | POA: Diagnosis not present

## 2018-04-12 DIAGNOSIS — C787 Secondary malignant neoplasm of liver and intrahepatic bile duct: Secondary | ICD-10-CM | POA: Diagnosis present

## 2018-04-12 DIAGNOSIS — I10 Essential (primary) hypertension: Secondary | ICD-10-CM | POA: Diagnosis present

## 2018-04-12 DIAGNOSIS — N3000 Acute cystitis without hematuria: Secondary | ICD-10-CM

## 2018-04-12 DIAGNOSIS — J449 Chronic obstructive pulmonary disease, unspecified: Secondary | ICD-10-CM | POA: Diagnosis present

## 2018-04-12 DIAGNOSIS — Z515 Encounter for palliative care: Secondary | ICD-10-CM | POA: Diagnosis not present

## 2018-04-12 DIAGNOSIS — R7401 Elevation of levels of liver transaminase levels: Secondary | ICD-10-CM | POA: Diagnosis present

## 2018-04-12 DIAGNOSIS — R4182 Altered mental status, unspecified: Secondary | ICD-10-CM

## 2018-04-12 DIAGNOSIS — Z96659 Presence of unspecified artificial knee joint: Secondary | ICD-10-CM | POA: Diagnosis present

## 2018-04-12 DIAGNOSIS — Z7951 Long term (current) use of inhaled steroids: Secondary | ICD-10-CM | POA: Diagnosis not present

## 2018-04-12 DIAGNOSIS — R740 Nonspecific elevation of levels of transaminase and lactic acid dehydrogenase [LDH]: Secondary | ICD-10-CM

## 2018-04-12 DIAGNOSIS — I2510 Atherosclerotic heart disease of native coronary artery without angina pectoris: Secondary | ICD-10-CM | POA: Diagnosis present

## 2018-04-12 DIAGNOSIS — Z888 Allergy status to other drugs, medicaments and biological substances status: Secondary | ICD-10-CM | POA: Diagnosis not present

## 2018-04-12 DIAGNOSIS — K219 Gastro-esophageal reflux disease without esophagitis: Secondary | ICD-10-CM | POA: Diagnosis present

## 2018-04-12 DIAGNOSIS — Z5112 Encounter for antineoplastic immunotherapy: Secondary | ICD-10-CM

## 2018-04-12 DIAGNOSIS — C799 Secondary malignant neoplasm of unspecified site: Secondary | ICD-10-CM | POA: Diagnosis not present

## 2018-04-12 DIAGNOSIS — Z885 Allergy status to narcotic agent status: Secondary | ICD-10-CM

## 2018-04-12 DIAGNOSIS — Z79899 Other long term (current) drug therapy: Secondary | ICD-10-CM

## 2018-04-12 DIAGNOSIS — C159 Malignant neoplasm of esophagus, unspecified: Secondary | ICD-10-CM | POA: Diagnosis not present

## 2018-04-12 LAB — COMPREHENSIVE METABOLIC PANEL
ALK PHOS: 215 U/L — AB (ref 38–126)
ALT: 367 U/L — AB (ref 0–44)
AST: 643 U/L — ABNORMAL HIGH (ref 15–41)
Albumin: 3.1 g/dL — ABNORMAL LOW (ref 3.5–5.0)
Anion gap: 9 (ref 5–15)
BUN: 12 mg/dL (ref 8–23)
CO2: 27 mmol/L (ref 22–32)
Calcium: 8.3 mg/dL — ABNORMAL LOW (ref 8.9–10.3)
Chloride: 104 mmol/L (ref 98–111)
Creatinine, Ser: 0.58 mg/dL (ref 0.44–1.00)
GFR calc Af Amer: >60 mL/min (ref >60)
GFR calc non Af Amer: >60 mL/min (ref >60)
Glucose, Bld: 117 mg/dL — ABNORMAL HIGH (ref 70–99)
Potassium: 3.5 mmol/L (ref 3.5–5.1)
Sodium: 140 mmol/L (ref 135–145)
Total Bilirubin: 1.4 mg/dL — ABNORMAL HIGH (ref 0.3–1.2)
Total Protein: 6.2 g/dL — ABNORMAL LOW (ref 6.5–8.1)

## 2018-04-12 LAB — URINALYSIS, COMPLETE (UACMP) WITH MICROSCOPIC
Bilirubin Urine: NEGATIVE
Glucose, UA: NEGATIVE mg/dL
Ketones, ur: 5 mg/dL — AB
Nitrite: NEGATIVE
Protein, ur: 100 mg/dL — AB
Specific Gravity, Urine: 1.016 (ref 1.005–1.030)
WBC, UA: >50 WBC/hpf — ABNORMAL HIGH (ref 0–5)
pH: 7 (ref 5.0–8.0)

## 2018-04-12 LAB — CBC
HCT: 34.7 % — ABNORMAL LOW (ref 36.0–46.0)
HEMOGLOBIN: 11.4 g/dL — AB (ref 12.0–15.0)
MCH: 33.6 pg (ref 26.0–34.0)
MCHC: 32.9 g/dL (ref 30.0–36.0)
MCV: 102.4 fL — ABNORMAL HIGH (ref 80.0–100.0)
Platelets: 157 10*3/uL (ref 150–400)
RBC: 3.39 MIL/uL — ABNORMAL LOW (ref 3.87–5.11)
RDW: 15.6 % — ABNORMAL HIGH (ref 11.5–15.5)
WBC: 2.3 10*3/uL — ABNORMAL LOW (ref 4.0–10.5)
nRBC: 0 % (ref 0.0–0.2)

## 2018-04-12 LAB — CG4 I-STAT (LACTIC ACID): Lactic Acid, Venous: 1.36 mmol/L (ref 0.5–1.9)

## 2018-04-12 MED ORDER — ONDANSETRON HCL 4 MG/2ML IJ SOLN: 4.0000 mg | Freq: Four times a day (QID) | INTRAMUSCULAR | Status: DC | PRN

## 2018-04-12 MED ORDER — SODIUM CHLORIDE 0.9 % IV BOLUS
1000.0000 mL | Freq: Once | INTRAVENOUS | Status: AC
  Administered 2018-04-12: 1000 mL via INTRAVENOUS

## 2018-04-12 MED ORDER — METOPROLOL SUCCINATE ER 50 MG PO TB24
50.0000 mg | ORAL_TABLET | Freq: Every day | ORAL | Status: DC
  Administered 2018-04-13 – 2018-04-14 (×2): 50 mg via ORAL
  Filled 2018-04-12 (×2): qty 1

## 2018-04-12 MED ORDER — MORPHINE SULFATE (PF) 2 MG/ML IV SOLN
2.0000 mg | INTRAVENOUS | Status: DC | PRN
  Administered 2018-04-13 (×2): 2 mg via INTRAVENOUS
  Filled 2018-04-12 (×2): qty 1

## 2018-04-12 MED ORDER — SENNOSIDES-DOCUSATE SODIUM 8.6-50 MG PO TABS: 1.0000 | ORAL_TABLET | Freq: Every evening | ORAL | Status: DC | PRN

## 2018-04-12 MED ORDER — SODIUM CHLORIDE 0.9 % IV SOLN
1.0000 g | INTRAVENOUS | Status: DC
  Administered 2018-04-13: 1 g via INTRAVENOUS
  Filled 2018-04-12: qty 1
  Filled 2018-04-12: qty 10

## 2018-04-12 MED ORDER — ENOXAPARIN SODIUM 40 MG/0.4ML ~~LOC~~ SOLN
40.0000 mg | SUBCUTANEOUS | Status: DC
  Administered 2018-04-13: 20:00:00 40 mg via SUBCUTANEOUS
  Filled 2018-04-12: qty 0.4

## 2018-04-12 MED ORDER — PANTOPRAZOLE SODIUM 20 MG PO TBEC
20.0000 mg | DELAYED_RELEASE_TABLET | Freq: Two times a day (BID) | ORAL | Status: DC
  Administered 2018-04-13 – 2018-04-14 (×3): 20 mg via ORAL
  Filled 2018-04-12 (×4): qty 1

## 2018-04-12 MED ORDER — TRAZODONE HCL 50 MG PO TABS
50.0000 mg | ORAL_TABLET | Freq: Every evening | ORAL | Status: DC | PRN
  Administered 2018-04-13 (×2): 50 mg via ORAL
  Filled 2018-04-12 (×2): qty 1

## 2018-04-12 MED ORDER — SODIUM CHLORIDE 0.9 % IV SOLN
1.0000 g | Freq: Once | INTRAVENOUS | Status: AC
  Administered 2018-04-12: 1 g via INTRAVENOUS
  Filled 2018-04-12: qty 10

## 2018-04-12 MED ORDER — LABETALOL HCL 5 MG/ML IV SOLN
10.0000 mg | INTRAVENOUS | Status: DC | PRN
  Administered 2018-04-13: 10 mg via INTRAVENOUS
  Filled 2018-04-12: qty 4

## 2018-04-12 MED ORDER — ONDANSETRON HCL 4 MG PO TABS: 4.0000 mg | ORAL_TABLET | Freq: Four times a day (QID) | ORAL | Status: DC | PRN

## 2018-04-12 MED ORDER — PREGABALIN 75 MG PO CAPS
150.0000 mg | ORAL_CAPSULE | Freq: Two times a day (BID) | ORAL | Status: DC
  Administered 2018-04-12 – 2018-04-14 (×4): 150 mg via ORAL
  Filled 2018-04-12 (×4): qty 2

## 2018-04-12 NOTE — ED Provider Notes (Signed)
[facility] Emergency Department Provider Note   ____________________________________________   I have reviewed the triage vital signs and the nursing notes.   HISTORY  Chief Complaint Altered Mental Status   History limited by and level 5 caveat due to AMS   HPI [name] is a 83 y.o. female who presents to the emergency department today via EMS because of AMS.  Patient is unable to give any meaningful history.   Apparently EMS was alerted that something might be wrong after welfare check when son had not heard from her.  She denies any pain.  Per medical record review patient has a history of COPD, dyspnea  Past Medical History:  Diagnosis Date  . Arthritis   . Asthma   . COPD (chronic obstructive pulmonary disease) ([place])   . Dysphonia   . Dyspnea   . Esophageal cancer ([place])   . GERD (gastroesophageal reflux disease)   . Heart murmur   . History of kidney stones   . Hypertension   . Hypokalemia 10/07/2017  . Melanoma ([place])   . OAB (overactive bladder)   . OSA on CPAP   . 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 Personal history of chemotherapy    esophageal cancer  . Personal history of radiation therapy    esophageal cancer  . Pupil asymmetry    R pupil defect  . Skin cancer   . Sleep apnea   . Spinal cord abscess     Patient Active Problem List   Diagnosis Date Noted  . Acute sinusitis 03/05/2018  . Urinary incontinence 03/05/2018  . Glycogenic acanthosis of the esophagus   . Chronic peptic ulcer of stomach   . Problems with swallowing and mastication   . Other foreign object in esophagus causing other injury, initial encounter   . Chronic gastric ulcer without hemorrhage and without perforation   . Anastomotic ulcer   . Hypokalemia 10/07/2017  . Thickening of esophagus   . Other specified diseases of the digestive system   . Stricture and stenosis of esophagus   . Radiation esophagitis   . Adverse effect  of radiation   . Esophageal adenocarcinoma ([place])   . Coronary artery calcification seen on CT scan 08/14/2017  . Aortic atherosclerosis ([place]) 08/14/2017  . Aortic stenosis 07/07/2017  . Goals of care, counseling/discussion 05/13/2017  . Encounter for antineoplastic chemotherapy 05/12/2017  . Tenosynovitis, nodular 05/08/2017  . Malignant neoplasm of lower third of esophagus ([place]) 03/29/2017  . Liver lesion   . Iron deficiency anemia   . Dysphagia   . Loss of weight   . Abdominal pain 03/20/2017  . Insomnia 12/19/2016  . Advance care planning 12/19/2016  . Hoarseness 07/11/2015  . Tinnitus of both ears 07/11/2015  . Vocal cord paralysis, unilateral complete 07/11/2015  . Knee joint replaced by other means 03/26/2015  . Paresthesia 08/26/2014  . Asthma without status asthmaticus 12/03/2013  . Borderline diabetes 12/03/2013  . Essential hypertension 12/03/2013  . OA (osteoarthritis) 12/03/2013  . Morbid obesity due to excess calories ([place]) 12/03/2013  . Overactive bladder 12/03/2013  . RLS (restless legs syndrome) 12/03/2013  . Sleep apnea 12/03/2013  . C4 spinal cord injury ([place]) 12/03/2013  . Dysphonia 11/24/2013  . Neuropathy 10/22/2013  . 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 OSA on CPAP 07/16/2013    Past Surgical History:  Procedure Laterality Date  . ABDOMINAL HYSTERECTOMY    .  ANTERIOR CERVICAL DECOMP/DISCECTOMY FUSION N/A 07/16/2013   Procedure: ANTERIOR CERVICAL DECOMPRESSION/DISCECTOMY FUSION mutlipleLEVELS C4-7;  Surgeon: Erline Levine, MD;  Location: [place] [facility]URO ORS;  Service: Neurosurgery;  Laterality: N/A;  . APPENDECTOMY    . carpel tunn Bilateral   . CATARACT EXTRACTION    . CATARACT EXTRACTION, BILATERAL    . CHOLECYSTECTOMY    . dental implant    . ESOPHAGOGASTRODUODENOSCOPY Left 03/21/2017   Procedure: ESOPHAGOGASTRODUODENOSCOPY (EGD);  Surgeon: Virgel Manifold, MD;  Location: [facility]  ENDOSCOPY;  Service: Endoscopy;  Laterality: Left;  . ESOPHAGOGASTRODUODENOSCOPY (EGD) WITH PROPOFOL N/A 08/20/2017   Procedure: ESOPHAGOGASTRODUODENOSCOPY (EGD) WITH PROPOFOL;  Surgeon: Virgel Manifold, MD;  Location: [facility] ENDOSCOPY;  Service: Endoscopy;  Laterality: N/A;  . ESOPHAGOGASTRODUODENOSCOPY (EGD) WITH PROPOFOL N/A 09/19/2017   Procedure: ESOPHAGOGASTRODUODENOSCOPY (EGD) WITH PROPOFOL;  Surgeon: Virgel Manifold, MD;  Location: [facility] ENDOSCOPY;  Service: Endoscopy;  Laterality: N/A;  . ESOPHAGOGASTRODUODENOSCOPY (EGD) WITH PROPOFOL N/A 10/03/2017   Procedure: ESOPHAGOGASTRODUODENOSCOPY (EGD) WITH PROPOFOL;  Surgeon: Virgel Manifold, MD;  Location: [facility] ENDOSCOPY;  Service: Endoscopy;  Laterality: N/A;  . ESOPHAGOGASTRODUODENOSCOPY (EGD) WITH PROPOFOL N/A 10/22/2017   Procedure: ESOPHAGOGASTRODUODENOSCOPY (EGD) WITH PROPOFOL;  Surgeon: Virgel Manifold, MD;  Location: [facility] ENDOSCOPY;  Service: Endoscopy;  Laterality: N/A;  . ESOPHAGOGASTRODUODENOSCOPY (EGD) WITH PROPOFOL N/A 11/18/2017   Procedure: ESOPHAGOGASTRODUODENOSCOPY (EGD) WITH PROPOFOL;  Surgeon: Virgel Manifold, MD;  Location: [facility] ENDOSCOPY;  Service: Endoscopy;  Laterality: N/A;  . ESOPHAGOGASTRODUODENOSCOPY (EGD) WITH PROPOFOL N/A 12/17/2017   Procedure: ESOPHAGOGASTRODUODENOSCOPY (EGD) WITH PROPOFOL;  Surgeon: Virgel Manifold, MD;  Location: [facility] ENDOSCOPY;  Service: Endoscopy;  Laterality: N/A;  . ESOPHAGOGASTRODUODENOSCOPY (EGD) WITH PROPOFOL N/A 01/08/2018   Procedure: ESOPHAGOGASTRODUODENOSCOPY (EGD) WITH PROPOFOL;  Surgeon: Virgel Manifold, MD;  Location: [facility] ENDOSCOPY;  Service: Endoscopy;  Laterality: N/A;  . ESOPHAGOGASTRODUODENOSCOPY (EGD) WITH PROPOFOL N/A 01/22/2018   Procedure: ESOPHAGOGASTRODUODENOSCOPY (EGD) WITH PROPOFOL;  Surgeon: Virgel Manifold, MD;  Location: [facility] ENDOSCOPY;  Service: Endoscopy;  Laterality: N/A;  . EYE SURGERY    . GASTRIC BYPASS    . HERNIA REPAIR    .  HIATAL HERNIA REPAIR    . JOINT REPLACEMENT    . MELANOMA EXCISION    . PORTA CATH INSERTION N/A 04/07/2017   Procedure: PORTA CATH INSERTION;  Surgeon: Algernon Huxley, MD;  Location: [place] CV LAB;  Service: Cardiovascular;  Laterality: N/A;  . POSTERIOR CERVICAL FUSION/FORAMINOTOMY N/A 07/28/2013   Procedure: Cervical four-seven  posterior cervical fusion;  Surgeon: Erline Levine, MD;  Location: [place] NEURO ORS;  Service: Neurosurgery;  Laterality: N/A;  . TONSILLECTOMY    . TOTAL KNEE ARTHROPLASTY      Prior to Admission medications   Medication Sig Start Date End Date Taking? Authorizing Provider  acetaminophen (TYLENOL) 325 MG tablet Take 650 mg by mouth every 6 (six) hours as needed for moderate pain or headache.     [provider]  ALBUTEROL SULFATE HFA IN Inhale into the lungs.  07/07/15   [provider]  calcium carbonate (TUMS EX) 750 MG chewable tablet Chew by mouth.    [provider]  CARAFATE 1 GM/10ML suspension TAKE 10MLS BY MOUTH FOUR TIMES DAILY 10/28/17   [name] B, MD  chlorhexidine (PERIDEX) 0.12 % solution Use as directed 15 mLs in the mouth or throat 2 (two) times daily.    [provider]  Cholecalciferol (VITAMIN D) 2000 UNITS CAPS Take 2,000 Units by mouth daily.    [provider]  cyanocobalamin 500 MCG tablet Take 500 mcg every other day by mouth.    [provider]  Diclofenac Sodium 1 % CREA Apply to left arm as needed for pain three times daily 04/23/17   Earlie Server, MD  dronabinol (MARINOL) 5 MG capsule Take 1 capsule (5 mg total) by mouth 2 (two) times daily before a meal. 01/26/18   Earlie Server, MD  fluticasone [facility]) 50 MCG/ACT nasal spray Place 1-2 sprays into both nostrils daily. 06/13/17   Tonia Ghent, MD  furosemide (LASIX) 20 MG tablet Take 1 tablet (20 mg total) by mouth daily. 08/14/17   Minna Merritts, MD  hydrocortisone cream 1 % Apply 1 application topically daily as needed for  itching.     [provider]  loratadine (CLARITIN) 10 MG tablet Take 10 mg by mouth daily.    [provider]  LYRICA 150 MG capsule TAKE 1 CAPSULE BY MOUTH TWO TIMES DAILY 01/06/17   Tonia Ghent, MD  metoprolol succinate (TOPROL-XL) 50 MG 24 hr tablet TAKE 1 TABLET BY MOUTH DAILY 10/28/17   Tonia Ghent, MD  mirabegron ER (MYRBETRIQ) 25 MG TB24 tablet Take 1 tablet (25 mg total) by mouth daily. 03/15/18   Tonia Ghent, MD  Multiple Vitamins-Minerals (MULTIVITAMIN ADULT) CHEW Chew by mouth daily.    [provider]  pantoprazole (PROTONIX) 40 MG tablet TAKE ONE TABLET TWICE DAILY BEFORE A MEAL 03/31/18   Earlie Server, MD  Polyethyl Glycol-Propyl Glycol (SYSTANE OP) Apply 1 drop to eye daily as needed (dry eyes).    [provider]  potassium chloride (KLOR-CON) 20 MEQ packet Take 20 mEq by mouth daily. 03/09/18   Earlie Server, MD  pyridOXINE (VITAMIN B-6) 100 MG tablet Take 1 tablet (100 mg total) by mouth daily. 04/28/17   Earlie Server, MD  senna-docusate (SENOKOT-S) 8.6-50 MG tablet Take 1 tablet at bedtime as needed by mouth for mild constipation. 03/22/17   Gouru, Illene Silver, MD  traMADol (ULTRAM) 50 MG tablet Take 1 tablet (50 mg total) by mouth every 6 (six) hours as needed. 11/28/17   Earlie Server, MD  traZODone (DESYREL) 50 MG tablet TAKE 1/2 TABLET BY MOUTH AT BEDTIME IF NEEDED FOR SLEEP 04/17/17   Tonia Ghent, MD  prochlorperazine (COMPAZINE) 10 MG tablet Take 1 tablet (10 mg total) by mouth every 6 (six) hours as needed (Nausea or vomiting). Patient not taking: Reported on 01/22/2018 06/30/17 01/26/18  Earlie Server, MD    Allergies Cefuroxime; Codeine; Doxycycline; Gabapentin; Iohexol; Other; Oxycodone; Quinolones; and Synvisc [hylan g-f 20]  Family History  Problem Relation Age of Onset  . Breast cancer Mother 32  . Arthritis-Osteo Mother   . Breast cancer Sister 50  . Bladder Cancer Sister   . Bladder Cancer Father   . Tongue cancer Father   . Congenital  heart disease Father   . Prostate cancer Brother   . Uterine cancer Maternal Aunt   . Lung cancer Paternal Uncle   . Leukemia Maternal Grandmother   . Liver cancer Paternal Grandmother   . Colon cancer Neg Hx     Social History Social History   Tobacco Use  . Smoking status: Former Smoker    Packs/day: 1.00    Years: 20.00    Pack years: 20.00    Types: Cigarettes    Last attempt to quit: 1977    Years since quitting: 42.9  . Smokeless tobacco: Never Used  Substance Use Topics  . Alcohol  use: No  . Drug use: Never    Review of Systems Unable to obtain reliable ROS secondary to dementia.  ____________________________________________   PHYSICAL EXAM:  VITAL SIGNS: ED Triage Vitals  Enc Vitals Group     BP 04/12/18 1856 (!) 161/72     Pulse Rate 04/12/18 1856 84     Resp 04/12/18 1856 18     Temp 04/12/18 1856 (!) 102.9 F (39.4 C)     Temp Source 04/12/18 1856 Oral     SpO2 04/12/18 1845 95 %     Weight 04/12/18 1857 203 lb 4.2 oz (92.2 kg)     Height 04/12/18 1857 '5\' 2"'  (1.575 m)   Constitutional: Awake and alert. Not oriented to events.  Eyes: Conjunctivae are normal.  ENT      Head: Normocephalic and atraumatic.      Nose: No congestion/rhinnorhea.      Mouth/Throat: Mucous membranes are moist.      Neck: No stridor. Hematological/Lymphatic/Immunilogical: No cervical lymphadenopathy. Cardiovascular: Normal rate, regular rhythm.  No murmurs, rubs, or gallops. Respiratory: Normal respiratory effort without tachypnea nor retractions. Breath sounds are clear and equal bilaterally. No wheezes/rales/rhonchi. Gastrointestinal: Soft and non tender. No rebound. No guarding.  Genitourinary: Deferred Musculoskeletal: Normal range of motion in all extremities. No lower extremity edema. Neurologic:  Awake and alert. Not oriented to events. Does not answer all questions appropriately. Moving all extremities. Following commands.  Skin:  Skin is warm, dry and intact. No  rash noted.  ____________________________________________    LABS (pertinent positives/negatives)  CBC wbc 2.3, hgb 11.4, plt 157 Lactic 1.36 CMP na 140, k 3.5, glu 117, cr 0.58, ast 643, alt 367, alk phos 215, t bili 1.4 ____________________________________________   EKG  I, Nance Pear, attending physician, personally viewed and interpreted this EKG  EKG Time: 1854 Rate: 86 Rhythm: sinus rhythm with PVC Axis: normal Intervals: qtc 444 QRS: narrow ST changes: no st elevation Impression: abnormal ekg   ____________________________________________    RADIOLOGY  CXR No acute findings  ____________________________________________   PROCEDURES  Procedures  ____________________________________________   INITIAL IMPRESSION / ASSESSMENT AND PLAN / ED COURSE  Pertinent labs & imaging results that were available during my care of the patient were reviewed by me and considered in my medical decision making (see chart for details).   Patient presented to the emergency department today because of concerns for altered mental status.  Differential would be broad including intracranial lesion, infection, electrolyte abnormality, drug reaction amongst other etiologies.  Initial vital signs was concerning for a fever.  Because of this I had more concern for possible infectious.  Chest x-ray without any pneumonia however urine was concerning for urinary tract infection.  Additionally liver function tests were noted to be elevated.  Patient is status post cholecystectomy.  Will plan on admission for urosepsis however discussed elevated LFTs with hospitalist. Discussed findings and plan with patient and famly.  ____________________________________________   FINAL CLINICAL IMPRESSION(S) / ED DIAGNOSES  Final diagnoses:  Elevated LFTs  Lower urinary tract infection  Sepsis, due to unspecified organism, unspecified whether acute organ dysfunction present ([place])  Altered mental  status, unspecified altered mental status type     Note: This dictation was prepared with Dragon dictation. Any transcriptional errors that result from this process are unintentional     Nance Pear, MD 04/13/18 0003

## 2018-04-12 NOTE — H&P (Signed)
[facility] [place] NAME: [name]    MR#:  [id number]  DATE OF BIRTH:  [birth year]  DATE OF ADMISSION:  04/12/2018  PRIMARY CARE PHYSICIAN: Tonia Ghent, MD   REQUESTING/REFERRING PHYSICIAN: Archie Balboa, MD  CHIEF COMPLAINT:   Chief Complaint  Patient presents with  . Altered Mental Status    HISTORY OF PRESENT ILLNESS:  [name]  is a 83 y.o. female who presents with chief complaint as above.  Patient is confused and unable to contribute to her HPI.  Family at bedside states that they found her at home in her bed 5:00 in the afternoon in this state.  They tried to call her when she did not respond they went to her house.  She is normally active and her activities of daily living.  Son at bedside states that yesterday she also did not do as much as she normally does and was probably not feeling well.  She is also complained of some epigastric abdominal discomfort transiently, couple of times over the past week.  Here in the ED tonight she is found to have UTI, met sepsis criteria.  She does have a history of esophageal adenocarcinoma with liver involvement, currently being treated on chemotherapy.  She is also found to have significant transaminitis in the ED tonight, compared to normal values about 6 to 7 days ago.  Abdominal ultrasound shows dilated common bile duct, patient is status post cholecystectomy.  Hospitalist were called for admission and further treatment  PAST MEDICAL HISTORY:   Past Medical History:  Diagnosis Date  . Arthritis   . Asthma   . COPD (chronic obstructive pulmonary disease) ([place])   . Dysphonia   . Dyspnea   . Esophageal cancer ([place])   . GERD (gastroesophageal reflux disease)   . Heart murmur   . History of kidney stones   . Hypertension   . Hypokalemia 10/07/2017  . Melanoma ([place])   . OAB (overactive bladder)   . OSA on CPAP   . 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  Personal history of chemotherapy    esophageal cancer  . Personal history of radiation therapy    esophageal cancer  . Pupil asymmetry    R pupil defect  . Skin cancer   . Sleep apnea   . Spinal cord abscess      PAST SURGICAL HISTORY:   Past Surgical History:  Procedure Laterality Date  . ABDOMINAL HYSTERECTOMY    . ANTERIOR CERVICAL DECOMP/DISCECTOMY FUSION N/A 07/16/2013   Procedure: ANTERIOR CERVICAL DECOMPRESSION/DISCECTOMY FUSION mutlipleLEVELS C4-7;  Surgeon: Erline Levine, MD;  Location: [facility];  Service: Neurosurgery;  Laterality: N/A;  . APPENDECTOMY    . carpel tunn Bilateral   . CATARACT EXTRACTION    . CATARACT EXTRACTION, BILATERAL    . CHOLECYSTECTOMY    . dental implant    . ESOPHAGOGASTRODUODENOSCOPY Left 03/21/2017   Procedure: ESOPHAGOGASTRODUODENOSCOPY (EGD);  Surgeon: Virgel Manifold, MD;  Location: [facility] ENDOSCOPY;  Service: Endoscopy;  Laterality: Left;  . ESOPHAGOGASTRODUODENOSCOPY (EGD) WITH PROPOFOL N/A 08/20/2017   Procedure: ESOPHAGOGASTRODUODENOSCOPY (EGD) WITH PROPOFOL;  Surgeon: Virgel Manifold, MD;  Location: [facility] ENDOSCOPY;  Service: Endoscopy;  Laterality: N/A;  . ESOPHAGOGASTRODUODENOSCOPY (EGD) WITH PROPOFOL N/A 09/19/2017   Procedure: ESOPHAGOGASTRODUODENOSCOPY (EGD) WITH PROPOFOL;  Surgeon: Virgel Manifold, MD;  Location: [facility] ENDOSCOPY;  Service: Endoscopy;  Laterality: N/A;  . ESOPHAGOGASTRODUODENOSCOPY (EGD) WITH PROPOFOL N/A 10/03/2017  Procedure: ESOPHAGOGASTRODUODENOSCOPY (EGD) WITH PROPOFOL;  Surgeon: Virgel Manifold, MD;  Location: [facility] ENDOSCOPY;  Service: Endoscopy;  Laterality: N/A;  . ESOPHAGOGASTRODUODENOSCOPY (EGD) WITH PROPOFOL N/A 10/22/2017   Procedure: ESOPHAGOGASTRODUODENOSCOPY (EGD) WITH PROPOFOL;  Surgeon: Virgel Manifold, MD;  Location: [facility] ENDOSCOPY;  Service: Endoscopy;  Laterality: N/A;  . ESOPHAGOGASTRODUODENOSCOPY (EGD) WITH PROPOFOL N/A 11/18/2017   Procedure: ESOPHAGOGASTRODUODENOSCOPY (EGD)  WITH PROPOFOL;  Surgeon: Virgel Manifold, MD;  Location: [facility] ENDOSCOPY;  Service: Endoscopy;  Laterality: N/A;  . ESOPHAGOGASTRODUODENOSCOPY (EGD) WITH PROPOFOL N/A 12/17/2017   Procedure: ESOPHAGOGASTRODUODENOSCOPY (EGD) WITH PROPOFOL;  Surgeon: Virgel Manifold, MD;  Location: [facility] ENDOSCOPY;  Service: Endoscopy;  Laterality: N/A;  . ESOPHAGOGASTRODUODENOSCOPY (EGD) WITH PROPOFOL N/A 01/08/2018   Procedure: ESOPHAGOGASTRODUODENOSCOPY (EGD) WITH PROPOFOL;  Surgeon: Virgel Manifold, MD;  Location: [facility] ENDOSCOPY;  Service: Endoscopy;  Laterality: N/A;  . ESOPHAGOGASTRODUODENOSCOPY (EGD) WITH PROPOFOL N/A 01/22/2018   Procedure: ESOPHAGOGASTRODUODENOSCOPY (EGD) WITH PROPOFOL;  Surgeon: Virgel Manifold, MD;  Location: [facility] ENDOSCOPY;  Service: Endoscopy;  Laterality: N/A;  . EYE SURGERY    . GASTRIC BYPASS    . HERNIA REPAIR    . HIATAL HERNIA REPAIR    . JOINT REPLACEMENT    . MELANOMA EXCISION    . PORTA CATH INSERTION N/A 04/07/2017   Procedure: PORTA CATH INSERTION;  Surgeon: Algernon Huxley, MD;  Location: [facility];  Service: Cardiovascular;  Laterality: N/A;  . POSTERIOR CERVICAL FUSION/FORAMINOTOMY N/A 07/28/2013   Procedure: Cervical four-seven  posterior cervical fusion;  Surgeon: Erline Levine, MD;  Location: [facility]S;  Service: Neurosurgery;  Laterality: N/A;  . TONSILLECTOMY    . TOTAL KNEE ARTHROPLASTY       SOCIAL HISTORY:   Social History   Tobacco Use  . Smoking status: Former Smoker    Packs/day: 1.00    Years: 20.00    Pack years: 20.00    Types: Cigarettes    Last attempt to quit: 1977    Years since quitting: 42.9  . Smokeless tobacco: Never Used  Substance Use Topics  . Alcohol use: No     FAMILY HISTORY:   Family History  Problem Relation Age of Onset  . Breast cancer Mother 75  . Arthritis-Osteo Mother   . Breast cancer Sister 65  . Bladder Cancer Sister   . Bladder Cancer Father   . Tongue cancer Father   . Congenital  heart disease Father   . Prostate cancer Brother   . Uterine cancer Maternal Aunt   . Lung cancer Paternal Uncle   . Leukemia Maternal Grandmother   . Liver cancer Paternal Grandmother   . Colon cancer Neg Hx      DRUG ALLERGIES:   Allergies  Allergen Reactions  . Cefuroxime Nausea Only  . Codeine Nausea Only  . Doxycycline Other (See Comments)    Lip swelling  . Gabapentin Other (See Comments)    Swelling.   . Iohexol Itching    07-15-13 pt developed itching on fingers after contrast given. Dr. Irish Elders looked at pt and said to put in system as allergy. BB  . Other Other (See Comments)    Contrast Dye- caused fever, chills, awful feeling Bandages - itching, rash  . Oxycodone Other (See Comments)    nausea  . Quinolones Swelling    Lip swelling  . Synvisc [Hylan G-F 20] Swelling    MEDICATIONS AT HOME:   Prior to Admission medications   Medication Sig Start Date End Date Taking?  Authorizing Provider  acetaminophen (TYLENOL) 325 MG tablet Take 650 mg by mouth every 6 (six) hours as needed for moderate pain or headache.     [provider]  ALBUTEROL SULFATE HFA IN Inhale into the lungs.  07/07/15   [provider]  calcium carbonate (TUMS EX) 750 MG chewable tablet Chew by mouth.    [provider]  CARAFATE 1 GM/10ML suspension TAKE 10MLS BY MOUTH FOUR TIMES DAILY 10/28/17   [name] B, MD  chlorhexidine (PERIDEX) 0.12 % solution Use as directed 15 mLs in the mouth or throat 2 (two) times daily.    [provider]  Cholecalciferol (VITAMIN D) 2000 UNITS CAPS Take 2,000 Units by mouth daily.    [provider]  cyanocobalamin 500 MCG tablet Take 500 mcg every other day by mouth.    [provider]  Diclofenac Sodium 1 % CREA Apply to left arm as needed for pain three times daily 04/23/17   Earlie Server, MD  dronabinol (MARINOL) 5 MG capsule Take 1 capsule (5 mg total) by mouth 2 (two) times daily before a meal. 01/26/18    Earlie Server, MD  fluticasone [facility]) 50 MCG/ACT nasal spray Place 1-2 sprays into both nostrils daily. 06/13/17   Tonia Ghent, MD  furosemide (LASIX) 20 MG tablet Take 1 tablet (20 mg total) by mouth daily. 08/14/17   Minna Merritts, MD  hydrocortisone cream 1 % Apply 1 application topically daily as needed for itching.     [provider]  loratadine (CLARITIN) 10 MG tablet Take 10 mg by mouth daily.    [provider]  LYRICA 150 MG capsule TAKE 1 CAPSULE BY MOUTH TWO TIMES DAILY 01/06/17   Tonia Ghent, MD  metoprolol succinate (TOPROL-XL) 50 MG 24 hr tablet TAKE 1 TABLET BY MOUTH DAILY 10/28/17   Tonia Ghent, MD  mirabegron ER (MYRBETRIQ) 25 MG TB24 tablet Take 1 tablet (25 mg total) by mouth daily. 03/15/18   Tonia Ghent, MD  Multiple Vitamins-Minerals (MULTIVITAMIN ADULT) CHEW Chew by mouth daily.    [provider]  pantoprazole (PROTONIX) 40 MG tablet TAKE ONE TABLET TWICE DAILY BEFORE A MEAL 03/31/18   Earlie Server, MD  Polyethyl Glycol-Propyl Glycol (SYSTANE OP) Apply 1 drop to eye daily as needed (dry eyes).    [provider]  potassium chloride (KLOR-CON) 20 MEQ packet Take 20 mEq by mouth daily. 03/09/18   Earlie Server, MD  pyridOXINE (VITAMIN B-6) 100 MG tablet Take 1 tablet (100 mg total) by mouth daily. 04/28/17   Earlie Server, MD  senna-docusate (SENOKOT-S) 8.6-50 MG tablet Take 1 tablet at bedtime as needed by mouth for mild constipation. 03/22/17   Gouru, Illene Silver, MD  traMADol (ULTRAM) 50 MG tablet Take 1 tablet (50 mg total) by mouth every 6 (six) hours as needed. 11/28/17   Earlie Server, MD  traZODone (DESYREL) 50 MG tablet TAKE 1/2 TABLET BY MOUTH AT BEDTIME IF NEEDED FOR SLEEP 04/17/17   Tonia Ghent, MD  prochlorperazine (COMPAZINE) 10 MG tablet Take 1 tablet (10 mg total) by mouth every 6 (six) hours as needed (Nausea or vomiting). Patient not taking: Reported on 01/22/2018 06/30/17 01/26/18  Earlie Server, MD    REVIEW OF SYSTEMS:  Review of  Systems  Unable to perform ROS: Acuity of condition     VITAL SIGNS:   Vitals:   04/12/18 1857 04/12/18 1959 04/12/18 2105 04/12/18 2107  BP:  (!) 166/94  Marland Kitchen)  161/83  Pulse:  87  86  Resp:  18  18  Temp:    98.3 F (36.8 C)  TempSrc:    Oral  SpO2:  95%  97%  Weight: 92.2 kg  93 kg   Height: 5' 2" (1.575 m)  5' 2" (1.575 m)    Wt Readings from Last 3 Encounters:  04/12/18 93 kg  04/06/18 92.3 kg  03/31/18 93.4 kg    PHYSICAL EXAMINATION:  Physical Exam  Vitals reviewed. Constitutional: She appears well-developed and well-nourished. No distress.  HENT:  Head: Normocephalic and atraumatic.  Mouth/Throat: Oropharynx is clear and moist.  Eyes: Pupils are equal, round, and reactive to light. Conjunctivae and EOM are normal. No scleral icterus.  Neck: Normal range of motion. Neck supple. No JVD present. No thyromegaly present.  Cardiovascular: Normal rate, regular rhythm and intact distal pulses. Exam reveals no gallop and no friction rub.  No murmur heard. Respiratory: Effort normal and breath sounds normal. No respiratory distress. She has no wheezes. She has no rales.  GI: Soft. Bowel sounds are normal. She exhibits no distension. There is tenderness.  Musculoskeletal: Normal range of motion. She exhibits no edema.  No arthritis, no gout  Lymphadenopathy:    She has no cervical adenopathy.  Neurological: She is alert. No cranial nerve deficit.  No dysarthria, no aphasia, unable to fully assess due to patient's confusion  Skin: Skin is warm and dry. No rash noted. No erythema.  Psychiatric:  Unable to fully assess due to confusion    LABORATORY PANEL:   CBC Recent Labs  Lab 04/12/18 1859  WBC 2.3*  HGB 11.4*  HCT 34.7*  PLT 157   ------------------------------------------------------------------------------------------------------------------  Chemistries  Recent Labs  Lab 04/12/18 1859  NA 140  K 3.5  CL 104  CO2 27  GLUCOSE 117*  BUN 12  CREATININE  0.58  CALCIUM 8.3*  AST 643*  ALT 367*  ALKPHOS 215*  BILITOT 1.4*   ------------------------------------------------------------------------------------------------------------------  Cardiac Enzymes No results for input(s): TROPONINI in the last 168 hours. ------------------------------------------------------------------------------------------------------------------  RADIOLOGY:  Dg Chest 1 View  Result Date: 04/12/2018 CLINICAL DATA:  Acute mental status change EXAM: CHEST  1 VIEW COMPARISON:  March 20, 2017 FINDINGS: A right Port-A-Cath terminates in the central SVC. No pneumothorax. Cardiomegaly. Atelectasis or scar in the left base. No pulmonary nodules, masses, or suspicious infiltrate. IMPRESSION: No active disease. Electronically Signed   By: Dorise Bullion III M.D   On: 04/12/2018 19:55    EKG:   Orders placed or performed during the hospital encounter of 04/12/18  . ED EKG  . ED EKG    IMPRESSION AND PLAN:  Principal Problem:   Sepsis ([place]) -IV antibiotics given, lactic acid within normal limits, cultures sent from the ED, blood pressure stable Active Problems:   UTI (urinary tract infection) -antibiotics, culture as above   Transaminitis -unclear etiology, perhaps bile duct pathology, feel it is less likely related to her cancer, though she does have history of liver involvement.  We will get a GI consult and MRCP initially to better evaluate her biliary tract, can get oncology consult if felt necessary   Essential hypertension -home dose antihypertensives   Malignant neoplasm of lower third of esophagus ([place]) -actively getting chemo, following with oncology, see above  Chart review performed and case discussed with ED provider. Labs, imaging and/or ECG reviewed by provider and discussed with patient/family. Management plans discussed with the patient and/or family.  DVT PROPHYLAXIS: SubQ lovenox  GI PROPHYLAXIS:  PPI   ADMISSION STATUS: Inpatient      CODE STATUS: Full Code Status History    Date Active Date Inactive Code Status Order ID Comments User Context   03/20/2017 0635 03/22/2017 1758 Full Code [id number]  Saundra Shelling, MD ED   07/31/2013 1854 08/26/2013 2106 Full Code [id number]  Meredith Staggers, MD Inpatient   07/30/2013 1945 07/31/2013 1854 Full Code [id number]  Cathlyn Parsons, PA-C Inpatient   07/28/2013 1811 07/30/2013 1945 Full Code [id number]  Erline Levine, MD Inpatient   07/22/2013 1506 07/28/2013 1414 Full Code [id number]  Cathlyn Parsons, PA-C Inpatient   07/22/2013 1506 07/22/2013 1506 Full Code [id number]  Elizabeth Sauer Inpatient   07/16/2013 0545 07/22/2013 1506 Full Code [id number]  Erline Levine, MD Inpatient    Advance Directive Documentation     Most Recent Value  Type of Advance Directive  Healthcare Power of Attorney  Pre-existing out of facility DNR order (yellow form or pink MOST form)  -  "MOST" Form in Place?  -      TOTAL TIME TAKING CARE OF THIS PATIENT: 45 minutes.   WILLIS, DAVID [place] 04/12/2018, 9:48 PM  [facility]  Office  [phone]  CC: Primary care physician; Tonia Ghent, MD  Note:  This document was prepared using Dragon voice recognition software and may include unintentional dictation errors.

## 2018-04-12 NOTE — ED Notes (Signed)
Patient is still confused, but alert. Patient states she is hungry. Patient took off pulse ox and blood pressure cuff. Family in the waiting room

## 2018-04-12 NOTE — ED Notes (Signed)
Patient remains alert, can answer some simple questions appropriately and make appropriate comments. Patient cannot answer complex questions and often searches for words.

## 2018-04-12 NOTE — ED Notes (Signed)
Ultrasound at bedside. Family at bedside.

## 2018-04-12 NOTE — ED Notes (Signed)
Patient's sons are at bedside.

## 2018-04-12 NOTE — ED Triage Notes (Signed)
Per EMS report, Patient's son called EMS for a well check because he couldn't get in touch with her today. EMS found the patient in bed. Son states patient is usually up and about. Patient is alert, but confused, unable to assess orientation. Patient was febrile at 102.8 axillary for EMS. Patient is 90 % on room air upon arrival. Patient has a strong odor of urine.

## 2018-04-13 ENCOUNTER — Inpatient Hospital Stay: Payer: Medicare Other

## 2018-04-13 DIAGNOSIS — Z87891 Personal history of nicotine dependence: Secondary | ICD-10-CM

## 2018-04-13 DIAGNOSIS — C799 Secondary malignant neoplasm of unspecified site: Secondary | ICD-10-CM

## 2018-04-13 DIAGNOSIS — C155 Malignant neoplasm of lower third of esophagus: Secondary | ICD-10-CM

## 2018-04-13 DIAGNOSIS — R7989 Other specified abnormal findings of blood chemistry: Secondary | ICD-10-CM

## 2018-04-13 DIAGNOSIS — A419 Sepsis, unspecified organism: Principal | ICD-10-CM

## 2018-04-13 DIAGNOSIS — Z5112 Encounter for antineoplastic immunotherapy: Secondary | ICD-10-CM

## 2018-04-13 DIAGNOSIS — Z515 Encounter for palliative care: Secondary | ICD-10-CM

## 2018-04-13 DIAGNOSIS — D72819 Decreased white blood cell count, unspecified: Secondary | ICD-10-CM

## 2018-04-13 DIAGNOSIS — N390 Urinary tract infection, site not specified: Secondary | ICD-10-CM

## 2018-04-13 DIAGNOSIS — R4182 Altered mental status, unspecified: Secondary | ICD-10-CM

## 2018-04-13 DIAGNOSIS — R945 Abnormal results of liver function studies: Secondary | ICD-10-CM

## 2018-04-13 LAB — CBC
HCT: 34.6 % — ABNORMAL LOW (ref 36.0–46.0)
Hemoglobin: 11.2 g/dL — ABNORMAL LOW (ref 12.0–15.0)
MCH: 33.1 pg (ref 26.0–34.0)
MCHC: 32.4 g/dL (ref 30.0–36.0)
MCV: 102.4 fL — AB (ref 80.0–100.0)
NRBC: 0 % (ref 0.0–0.2)
PLATELETS: 137 10*3/uL — AB (ref 150–400)
RBC: 3.38 MIL/uL — ABNORMAL LOW (ref 3.87–5.11)
RDW: 15.7 % — ABNORMAL HIGH (ref 11.5–15.5)
WBC: 2.5 10*3/uL — ABNORMAL LOW (ref 4.0–10.5)

## 2018-04-13 LAB — COMPREHENSIVE METABOLIC PANEL
ALT: 250 U/L — ABNORMAL HIGH (ref 0–44)
AST: 329 U/L — ABNORMAL HIGH (ref 15–41)
Albumin: 2.8 g/dL — ABNORMAL LOW (ref 3.5–5.0)
Alkaline Phosphatase: 152 U/L — ABNORMAL HIGH (ref 38–126)
Anion gap: 9 (ref 5–15)
BUN: 12 mg/dL (ref 8–23)
CO2: 28 mmol/L (ref 22–32)
Calcium: 8 mg/dL — ABNORMAL LOW (ref 8.9–10.3)
Chloride: 103 mmol/L (ref 98–111)
Creatinine, Ser: 0.64 mg/dL (ref 0.44–1.00)
GFR calc Af Amer: >60 mL/min (ref >60)
GFR calc non Af Amer: >60 mL/min (ref >60)
Glucose, Bld: 110 mg/dL — ABNORMAL HIGH (ref 70–99)
Potassium: 3.4 mmol/L — ABNORMAL LOW (ref 3.5–5.1)
Sodium: 140 mmol/L (ref 135–145)
Total Bilirubin: 1 mg/dL (ref 0.3–1.2)
Total Protein: 5.7 g/dL — ABNORMAL LOW (ref 6.5–8.1)

## 2018-04-13 LAB — DIFFERENTIAL
Basophils Absolute: 0 10*3/uL (ref 0.0–0.1)
Basophils Relative: 1 %
Eosinophils Absolute: 0 10*3/uL (ref 0.0–0.5)
Eosinophils Relative: 0 %
Lymphocytes Relative: 13 %
Lymphs Abs: 0.3 10*3/uL — ABNORMAL LOW (ref 0.7–4.0)
MONOS PCT: 13 %
Monocytes Absolute: 0.3 10*3/uL (ref 0.1–1.0)
Neutro Abs: 1.8 10*3/uL (ref 1.7–7.7)
Neutrophils Relative %: 71 %

## 2018-04-13 MED ORDER — ACETAMINOPHEN 325 MG PO TABS
650.0000 mg | ORAL_TABLET | Freq: Four times a day (QID) | ORAL | Status: DC | PRN
  Administered 2018-04-13: 20:00:00 650 mg via ORAL
  Filled 2018-04-13: qty 2

## 2018-04-13 MED ORDER — GADOBUTROL 1 MMOL/ML IV SOLN
9.0000 mL | Freq: Once | INTRAVENOUS | Status: AC | PRN
  Administered 2018-04-13: 18:00:00 9 mL via INTRAVENOUS

## 2018-04-13 MED ORDER — POTASSIUM CHLORIDE 20 MEQ PO PACK
20.0000 meq | PACK | Freq: Once | ORAL | Status: AC
  Administered 2018-04-13: 20 meq via ORAL
  Filled 2018-04-13: qty 1

## 2018-04-13 NOTE — Progress Notes (Signed)
[facility] [place] NAME: Esti Demello    MR#:  [id number]  DATE OF BIRTH:  01/19/1935  SUBJECTIVE:  CHIEF COMPLAINT:   Chief Complaint  Patient presents with  . Altered Mental Status  lethargic REVIEW OF SYSTEMS:  Review of Systems  Unable to perform ROS: Mental acuity    DRUG ALLERGIES:   Allergies  Allergen Reactions  . Cefuroxime Nausea Only  . Codeine Nausea Only  . Doxycycline Other (See Comments)    Lip swelling  . Gabapentin Other (See Comments)    Swelling.   . Iohexol Itching    07-15-13 pt developed itching on fingers after contrast given. Dr. Irish Elders looked at pt and said to put in system as allergy. BB  . Other Other (See Comments)    Contrast Dye- caused fever, chills, awful feeling Bandages - itching, rash  . Oxycodone Other (See Comments)    nausea  . Quinolones Swelling    Lip swelling  . Synvisc [Hylan G-F 20] Swelling   VITALS:  Blood pressure (!) 168/84, pulse 93, temperature (!) 101.8 F (38.8 C), temperature source Oral, resp. rate 20, height 5\' 2"  (1.575 m), weight 93 kg, SpO2 97 %. PHYSICAL EXAMINATION:  Physical Exam  Constitutional: She appears lethargic.  HENT:  Head: Normocephalic and atraumatic.  Eyes: Pupils are equal, round, and reactive to light. Conjunctivae and EOM are normal.  Neck: Normal range of motion. Neck supple. No tracheal deviation present. No thyromegaly present.  Cardiovascular: Normal rate, regular rhythm and normal heart sounds.  Pulmonary/Chest: Effort normal and breath sounds normal. No respiratory distress. She has no wheezes. She exhibits no tenderness.  Abdominal: Soft. Bowel sounds are normal. She exhibits no distension. There is no tenderness.  Musculoskeletal: Normal range of motion.  Neurological: She appears lethargic. No cranial nerve deficit.  Skin: Skin is warm and dry. No rash noted.  Psychiatric:  Difficult to evaluate due to lethargy   LABORATORY  PANEL:  Female CBC Recent Labs  Lab 04/13/18 0603  WBC 2.5*  HGB 11.2*  HCT 34.6*  PLT 137*   ------------------------------------------------------------------------------------------------------------------ Chemistries  Recent Labs  Lab 04/13/18 0603  NA 140  K 3.4*  CL 103  CO2 28  GLUCOSE 110*  BUN 12  CREATININE 0.64  CALCIUM 8.0*  AST 329*  ALT 250*  ALKPHOS 152*  BILITOT 1.0   RADIOLOGY:  Dg Chest 1 View  Result Date: 04/12/2018 CLINICAL DATA:  Acute mental status change EXAM: CHEST  1 VIEW COMPARISON:  March 20, 2017 FINDINGS: A right Port-A-Cath terminates in the central SVC. No pneumothorax. Cardiomegaly. Atelectasis or scar in the left base. No pulmonary nodules, masses, or suspicious infiltrate. IMPRESSION: No active disease. Electronically Signed   By: Dorise Bullion III M.D   On: 04/12/2018 19:55   Ct Head Wo Contrast  Result Date: 04/13/2018 CLINICAL DATA:  83 y/o F; history of metastatic esophageal cancer. Patient presents with altered mental status. EXAM: CT HEAD WITHOUT CONTRAST TECHNIQUE: Contiguous axial images were obtained from the base of the skull through the vertex without intravenous contrast. COMPARISON:  01/23/2018 PET-CT. FINDINGS: Brain: No evidence of acute infarction, hemorrhage, hydrocephalus, extra-axial collection or mass lesion/mass effect. Few nonspecific foci of hypodensity within white matter likely represent mild chronic microvascular ischemic changes and there is mild volume loss of the brain. Vascular: Calcific atherosclerosis of the carotid siphons. No hyperdense vessel identified. Skull: Normal. Negative for fracture or focal lesion. Sinuses/Orbits: Small fluid  level within the left sphenoid sinus. Paranasal sinuses and mastoid air cells are otherwise normally aerated. Bilateral intra-ocular lens replacement. Other: None. IMPRESSION: 1. No acute intracranial abnormality identified. 2. Mild chronic microvascular ischemic changes and mild  volume loss of the brain. 3. Small fluid level within left sphenoid sinus may represent acute sinusitis in the appropriate clinical setting. 4. No gross mass effect to indicate metastatic disease. Small metastasis may not be visible with noncontrast CT. MRI of the brain with and without contrast is the study of choice to evaluate for intracranial metastatic disease. Electronically Signed   By: Kristine Garbe M.D.   On: 04/13/2018 14:27   US Abdomen Limited Ruq  Result Date: 04/12/2018 CLINICAL DATA:  Elevated LFTs EXAM: ULTRASOUND ABDOMEN LIMITED RIGHT UPPER QUADRANT COMPARISON:  CT 01/23/2018 FINDINGS: Gallbladder: Prior cholecystectomy Common bile duct: Diameter: Dilated, 10 mm. The distal duct cannot be visualized due to overlying bowel gas. Liver: Intrahepatic biliary ductal dilatation. There is a 2 cm cyst in the right lobe of the liver. Increased heterogeneous echotexture throughout the liver suggesting fatty infiltration. Possible solid lesion in the right hepatic lobe measuring up to 2.4 cm which is isoechoic. It is difficult to definitively confirm given its isoechoic appearance. Portal vein is patent on color Doppler imaging with normal direction of blood flow towards the liver. IMPRESSION: Dilated common bile duct, measuring 10 mm. This may be related to post cholecystectomy state. If there is a concern for biliary obstruction, MRCP or ERCP may be beneficial. Heterogeneous, increased echotexture throughout the liver suggesting fatty infiltration or intrinsic liver disease. Possible isoechoic solid lesion in the right hepatic lobe measuring 2.4 cm versus an area of heterogeneity. This could be further evaluated with contrast-enhanced CT or MRI to exclude focal hepatic lesion. Electronically Signed   By: Rolm Baptise M.D.   On: 04/12/2018 22:05   ASSESSMENT AND PLAN:  Patient is a 83 y.o. female with history of metastatic esophageal cancer, currently on second line chemotherapy, history of  melanoma, heart murmur, COPD, OSA, admitted for altered mental status.  * Acute Metabolic Encephalopathy - likely due to sepsis. Improving with treatment - CT head neg for acute patho  * Sepsis: present on admission, due to UTI  * UTI: based on UA. pending blood culture and urine culture.  Continue IV ceftriaxone  * Leukopenia, likely chemotherapy induced. Onco following  * Transaminitis/hyperbilirubinemia/dilated CBD - could be from mets Pending MRCP abdomen Appreciate GI input.   * Metastatic Esophageal cancer - f/by Onco - d/w Dr Tasia Catchings and appreciate her input  * Essential hypertension -home dose antihypertensives        All the records are reviewed and case discussed with Care Management/Social Worker. Management plans discussed with the patient, family (son at bedside) and they are in agreement.  CODE STATUS: DNR  TOTAL TIME TAKING CARE OF THIS PATIENT: 35 minutes.   More than 50% of the time was spent in counseling/coordination of care: YES  POSSIBLE D/C IN 1-2 DAYS, DEPENDING ON CLINICAL CONDITION.   Max Sane M.D on 04/13/2018 at 6:11 PM  Between 7am to 6pm - Pager - [phone]  After 6pm go to [email or web address] - password [email or web address] [facility]  [facility]  Office  [phone]  CC: Primary care physician; Tonia Ghent, MD  Note: This dictation was prepared with Dragon dictation along with smaller phrase technology. Any transcriptional errors that result from this process are unintentional.

## 2018-04-13 NOTE — Progress Notes (Signed)
Hematology/Oncology Consult note [facility] [phone] [phone]  Patient Care Team: Tonia Ghent, MD as PCP - General (Family Medicine)   Name of the patient: [name]  [id number]  1934-07-03   Date of visit: 04/13/18 REASON FOR COSULTATION:   History of presenting illness-  83 y.o. female with PMH listed at below, known to me for metastatic esophageal cancer, was sent to ER via EMS because of altered mental status.  Patient is not able to provide any medical history.  Family members reports that patient has been in her usual state of health on Thanksgiving day.  family numbers called patient on Sunday morning and the patient was not answering her phone calls.  5 PM family members found her at home with altered mental status and called EMS.  Here in emergency room, patient was found to meet sepsis criteria.  UA was positive.  Patient follows up with me for treatment of metastatic esophageal cancer, currently on second line chemotherapy with Taxol and Cyramza.  She was last seen by me approximately 1 week ago.  Last chemotherapy was on 04/06/2018.  Patient has been started on IV ceftriaxone for treatment of UTI. Labs also showed sudden increase of bilirubin, transaminitis.  Ultrasound abdomen showed dilated common bile duct, measuring 10 mm. MRI MRCP of abdomen pending.  Patient was seen and evaluated by me.  She remains lethargic not responding to questions.  1 of her sons sitting at the bedside.   Review of Systems  Unable to perform ROS: Mental status change    Allergies  Allergen Reactions  . Cefuroxime Nausea Only  . Codeine Nausea Only  . Doxycycline Other (See Comments)    Lip swelling  . Gabapentin Other (See Comments)    Swelling.   . Iohexol Itching    07-15-13 pt developed itching on fingers after contrast given. Dr. Irish Elders looked at pt and said to put in system as allergy. BB  . Other Other (See Comments)   Contrast Dye- caused fever, chills, awful feeling Bandages - itching, rash  . Oxycodone Other (See Comments)    nausea  . Quinolones Swelling    Lip swelling  . Synvisc [[place] G-F 20] Swelling    Patient Active Problem List   Diagnosis Date Noted  . Sepsis ([place]) 04/12/2018  . UTI (urinary tract infection) 04/12/2018  . Transaminitis 04/12/2018  . Acute sinusitis 03/05/2018  . Urinary incontinence 03/05/2018  . Glycogenic acanthosis of the esophagus   . Chronic peptic ulcer of stomach   . Problems with swallowing and mastication   . Other foreign object in esophagus causing other injury, initial encounter   . Chronic gastric ulcer without hemorrhage and without perforation   . Anastomotic ulcer   . Hypokalemia 10/07/2017  . Thickening of esophagus   . Other specified diseases of the digestive system   . Stricture and stenosis of esophagus   . Radiation esophagitis   . Adverse effect of radiation   . Esophageal adenocarcinoma ([place])   . Coronary artery calcification seen on CT scan 08/14/2017  . Aortic atherosclerosis ([place]) 08/14/2017  . Aortic stenosis 07/07/2017  . Goals of care, counseling/discussion 05/13/2017  . Encounter for antineoplastic chemotherapy 05/12/2017  . Tenosynovitis, nodular 05/08/2017  . Malignant neoplasm of lower third of esophagus ([place]) 03/29/2017  . Liver lesion   . Iron deficiency anemia   . Dysphagia   . Loss of weight   . Abdominal pain 03/20/2017  . Insomnia 12/19/2016  .  Advance care planning 12/19/2016  . Hoarseness 07/11/2015  . Tinnitus of both ears 07/11/2015  . Vocal cord paralysis, unilateral complete 07/11/2015  . Knee joint replaced by other means 03/26/2015  . Paresthesia 08/26/2014  . Asthma without status asthmaticus 12/03/2013  . Borderline diabetes 12/03/2013  . Essential hypertension 12/03/2013  . OA (osteoarthritis) 12/03/2013  . Morbid obesity due to excess calories ([place]) 12/03/2013  . Overactive bladder 12/03/2013  .  RLS (restless legs syndrome) 12/03/2013  . Sleep apnea 12/03/2013  . C4 spinal cord injury ([place]) 12/03/2013  . Dysphonia 11/24/2013  . Neuropathy 10/22/2013  . Polyneuropathy associated with critical illness ([place]) 10/22/2013  . 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 OSA on CPAP 07/16/2013     Past Medical History:  Diagnosis Date  . Arthritis   . Asthma   . COPD (chronic obstructive pulmonary disease) ([place])   . Dysphonia   . Dyspnea   . Esophageal cancer ([place])   . GERD (gastroesophageal reflux disease)   . Heart murmur   . History of kidney stones   . Hypertension   . Hypokalemia 10/07/2017  . Melanoma ([place])   . OAB (overactive bladder)   . OSA on CPAP   . 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 Personal history of chemotherapy    esophageal cancer  . Personal history of radiation therapy    esophageal cancer  . Pupil asymmetry    R pupil defect  . Skin cancer   . Sleep apnea   . Spinal cord abscess      Past Surgical History:  Procedure Laterality Date  . ABDOMINAL HYSTERECTOMY    . ANTERIOR CERVICAL DECOMP/DISCECTOMY FUSION N/A 07/16/2013   Procedure: ANTERIOR CERVICAL DECOMPRESSION/DISCECTOMY FUSION mutlipleLEVELS C4-7;  Surgeon: Erline Levine, MD;  Location: [facility]URO ORS;  Service: Neurosurgery;  Laterality: N/A;  . APPENDECTOMY    . carpel tunn Bilateral   . CATARACT EXTRACTION    . CATARACT EXTRACTION, BILATERAL    . CHOLECYSTECTOMY    . dental implant    . ESOPHAGOGASTRODUODENOSCOPY Left 03/21/2017   Procedure: ESOPHAGOGASTRODUODENOSCOPY (EGD);  Surgeon: Virgel Manifold, MD;  Location: [facility] ENDOSCOPY;  Service: Endoscopy;  Laterality: Left;  . ESOPHAGOGASTRODUODENOSCOPY (EGD) WITH PROPOFOL N/A 08/20/2017   Procedure: ESOPHAGOGASTRODUODENOSCOPY (EGD) WITH PROPOFOL;  Surgeon: Virgel Manifold, MD;  Location: [facility] ENDOSCOPY;  Service: Endoscopy;  Laterality: N/A;  . ESOPHAGOGASTRODUODENOSCOPY (EGD) WITH PROPOFOL N/A 09/19/2017   Procedure:  ESOPHAGOGASTRODUODENOSCOPY (EGD) WITH PROPOFOL;  Surgeon: Virgel Manifold, MD;  Location: [facility] ENDOSCOPY;  Service: Endoscopy;  Laterality: N/A;  . ESOPHAGOGASTRODUODENOSCOPY (EGD) WITH PROPOFOL N/A 10/03/2017   Procedure: ESOPHAGOGASTRODUODENOSCOPY (EGD) WITH PROPOFOL;  Surgeon: Virgel Manifold, MD;  Location: [facility] ENDOSCOPY;  Service: Endoscopy;  Laterality: N/A;  . ESOPHAGOGASTRODUODENOSCOPY (EGD) WITH PROPOFOL N/A 10/22/2017   Procedure: ESOPHAGOGASTRODUODENOSCOPY (EGD) WITH PROPOFOL;  Surgeon: Virgel Manifold, MD;  Location: [facility] ENDOSCOPY;  Service: Endoscopy;  Laterality: N/A;  . ESOPHAGOGASTRODUODENOSCOPY (EGD) WITH PROPOFOL N/A 11/18/2017   Procedure: ESOPHAGOGASTRODUODENOSCOPY (EGD) WITH PROPOFOL;  Surgeon: Virgel Manifold, MD;  Location: [facility] ENDOSCOPY;  Service: Endoscopy;  Laterality: N/A;  . ESOPHAGOGASTRODUODENOSCOPY (EGD) WITH PROPOFOL N/A 12/17/2017   Procedure: ESOPHAGOGASTRODUODENOSCOPY (EGD) WITH PROPOFOL;  Surgeon: Virgel Manifold, MD;  Location: [facility] ENDOSCOPY;  Service: Endoscopy;  Laterality: N/A;  . ESOPHAGOGASTRODUODENOSCOPY (EGD) WITH PROPOFOL N/A 01/08/2018   Procedure: ESOPHAGOGASTRODUODENOSCOPY (EGD) WITH PROPOFOL;  Surgeon: Virgel Manifold, MD;  Location: [facility] ENDOSCOPY;  Service: Endoscopy;  Laterality: N/A;  .  ESOPHAGOGASTRODUODENOSCOPY (EGD) WITH PROPOFOL N/A 01/22/2018   Procedure: ESOPHAGOGASTRODUODENOSCOPY (EGD) WITH PROPOFOL;  Surgeon: Virgel Manifold, MD;  Location: [facility] ENDOSCOPY;  Service: Endoscopy;  Laterality: N/A;  . EYE SURGERY    . GASTRIC BYPASS    . HERNIA REPAIR    . HIATAL HERNIA REPAIR    . JOINT REPLACEMENT    . MELANOMA EXCISION    . PORTA CATH INSERTION N/A 04/07/2017   Procedure: PORTA CATH INSERTION;  Surgeon: Algernon Huxley, MD;  Location: [facility] CV LAB;  Service: Cardiovascular;  Laterality: N/A;  . POSTERIOR CERVICAL FUSION/FORAMINOTOMY N/A 07/28/2013   Procedure: Cervical four-seven  posterior cervical  fusion;  Surgeon: Erline Levine, MD;  Location: [facility] NEURO ORS;  Service: Neurosurgery;  Laterality: N/A;  . TONSILLECTOMY    . TOTAL KNEE ARTHROPLASTY      Social History   Socioeconomic History  . Marital status: Widowed    Spouse name: Not on file  . Number of children: 2  . Years of education: Not on file  . Highest education level: Master's degree (e.g., MA, MS, MEng, MEd, MSW, MBA)  Occupational History  . Not on file  Social Needs  . Financial resource strain: Not hard at all  . Food insecurity:    Worry: Never true    Inability: Never true  . Transportation needs:    Medical: No    Non-medical: No  Tobacco Use  . Smoking status: Former Smoker    Packs/day: 1.00    Years: 20.00    Pack years: 20.00    Types: Cigarettes    Last attempt to quit: 1977    Years since quitting: 42.9  . Smokeless tobacco: Never Used  Substance and Sexual Activity  . Alcohol use: No  . Drug use: Never  . Sexual activity: Not Currently  Lifestyle  . Physical activity:    Days per week: 2 days    Minutes per session: 30 min  . Stress: Not at all  Relationships  . Social connections:    Talks on phone: More than three times a week    Gets together: More than three times a week    Attends religious service: More than 4 times per year    Active member of club or organization: Yes    Attends meetings of clubs or organizations: More than 4 times per year    Relationship status: Widowed  . Intimate partner violence:    Fear of current or ex partner: No    Emotionally abused: No    Physically abused: No    Forced sexual activity: No  Other Topics Concern  . Not on file  Social History Narrative   Marital Status- Widowed    Lives by herself    Employement- Retired Pharmacist, hospital   Exercise hx- Does PT      Family History  Problem Relation Age of Onset  . Breast cancer Mother 70  . Arthritis-Osteo Mother   . Breast cancer Sister 75  . Bladder Cancer Sister   . Bladder Cancer Father     . Tongue cancer Father   . Congenital heart disease Father   . Prostate cancer Brother   . Uterine cancer Maternal Aunt   . Lung cancer Paternal Uncle   . Leukemia Maternal Grandmother   . Liver cancer Paternal Grandmother   . Colon cancer Neg Hx      Current Facility-Administered Medications:  .  cefTRIAXone (ROCEPHIN) 1 g in sodium chloride 0.9 %  100 mL IVPB, 1 g, Intravenous, Q24H, Lance Coon, MD .  enoxaparin (LOVENOX) injection 40 mg, 40 mg, Subcutaneous, Q24H, Lance Coon, MD .  labetalol (NORMODYNE,TRANDATE) injection 10 mg, 10 mg, Intravenous, Q2H PRN, Lance Coon, MD, 10 mg at 04/13/18 0003 .  metoprolol succinate (TOPROL-XL) 24 hr tablet 50 mg, 50 mg, Oral, Daily, Lance Coon, MD, 50 mg at 04/13/18 1100 .  morphine 2 MG/ML injection 2 mg, 2 mg, Intravenous, Q4H PRN, Lance Coon, MD, 2 mg at 04/13/18 0542 .  ondansetron (ZOFRAN) tablet 4 mg, 4 mg, Oral, Q6H PRN **OR** ondansetron (ZOFRAN) injection 4 mg, 4 mg, Intravenous, Q6H PRN, Lance Coon, MD .  pantoprazole (PROTONIX) EC tablet 20 mg, 20 mg, Oral, BID Kenn File, MD, 20 mg at 04/13/18 1100 .  pregabalin (LYRICA) capsule 150 mg, 150 mg, Oral, BID, Lance Coon, MD, 150 mg at 04/13/18 1100 .  senna-docusate (Senokot-S) tablet 1 tablet, 1 tablet, Oral, QHS PRN, Lance Coon, MD .  traZODone (DESYREL) tablet 50 mg, 50 mg, Oral, QHS PRN, Lance Coon, MD, 50 mg at 04/13/18 0007  Facility-Administered Medications Ordered in Other Encounters:  .  sodium chloride flush (NS) 0.9 % injection 10 mL, 10 mL, Intravenous, PRN, Earlie Server, MD, 10 mL at 01/26/18 0904   Physical exam:  Vitals:   04/12/18 2208 04/12/18 2331 04/13/18 0531 04/13/18 0824  BP: (!) 178/78 (!) 172/91 (!) 126/51 (!) 168/84  Pulse: 89 74 85 93  Resp: 18 16 (!) 24 20  Temp:  98.6 F (37 C) 99.1 F (37.3 C) (!) 101.8 F (38.8 C)  TempSrc:  Oral Oral Oral  SpO2: 96% 96% 100% 97%  Weight:      Height:       Physical Exam   Constitutional:  Lethargic, not responding to questions.  HENT:  Head: Normocephalic and atraumatic.  Mouth/Throat: No oropharyngeal exudate.  Eyes: Pupils are equal, round, and reactive to light. EOM are normal.  Neck: Normal range of motion. Neck supple.  Cardiovascular: Normal rate and regular rhythm.  Murmur heard. Pulmonary/Chest: Effort normal. No respiratory distress.  Abdominal: Soft. She exhibits no distension.  Musculoskeletal: Normal range of motion. She exhibits no edema.  Neurological:  Lethargic and confused.  Skin: Skin is warm.  Psychiatric:  Deferred        CMP Latest Ref Rng & Units 04/13/2018  Glucose 70 - 99 mg/dL 110(H)  BUN 8 - 23 mg/dL 12  Creatinine 0.44 - 1.00 mg/dL 0.64  Sodium 135 - 145 mmol/L 140  Potassium 3.5 - 5.1 mmol/L 3.4(L)  Chloride 98 - 111 mmol/L 103  CO2 22 - 32 mmol/L 28  Calcium 8.9 - 10.3 mg/dL 8.0(L)  Total Protein 6.5 - 8.1 g/dL 5.7(L)  Total Bilirubin 0.3 - 1.2 mg/dL 1.0  Alkaline Phos 38 - 126 U/L 152(H)  AST 15 - 41 U/L 329(H)  ALT 0 - 44 U/L 250(H)   CBC Latest Ref Rng & Units 04/13/2018  WBC 4.0 - 10.5 K/uL 2.5(L)  Hemoglobin 12.0 - 15.0 g/dL 11.2(L)  Hematocrit 36.0 - 46.0 % 34.6(L)  Platelets 150 - 400 K/uL 137(L)   RADIOGRAPHIC STUDIES: I have personally reviewed the radiological images as listed and agreed with the findings in the report.  Dg Chest 1 View  Result Date: 04/12/2018 CLINICAL DATA:  Acute mental status change EXAM: CHEST  1 VIEW COMPARISON:  March 20, 2017 FINDINGS: A right Port-A-Cath terminates in the central SVC. No pneumothorax. Cardiomegaly. Atelectasis or scar in  the left base. No pulmonary nodules, masses, or suspicious infiltrate. IMPRESSION: No active disease. Electronically Signed   By: Dorise Bullion III M.D   On: 04/12/2018 19:55   US Abdomen Limited Ruq  Result Date: 04/12/2018 CLINICAL DATA:  Elevated LFTs EXAM: ULTRASOUND ABDOMEN LIMITED RIGHT UPPER QUADRANT COMPARISON:  CT  01/23/2018 FINDINGS: Gallbladder: Prior cholecystectomy Common bile duct: Diameter: Dilated, 10 mm. The distal duct cannot be visualized due to overlying bowel gas. Liver: Intrahepatic biliary ductal dilatation. There is a 2 cm cyst in the right lobe of the liver. Increased heterogeneous echotexture throughout the liver suggesting fatty infiltration. Possible solid lesion in the right hepatic lobe measuring up to 2.4 cm which is isoechoic. It is difficult to definitively confirm given its isoechoic appearance. Portal vein is patent on color Doppler imaging with normal direction of blood flow towards the liver. IMPRESSION: Dilated common bile duct, measuring 10 mm. This may be related to post cholecystectomy state. If there is a concern for biliary obstruction, MRCP or ERCP may be beneficial. Heterogeneous, increased echotexture throughout the liver suggesting fatty infiltration or intrinsic liver disease. Possible isoechoic solid lesion in the right hepatic lobe measuring 2.4 cm versus an area of heterogeneity. This could be further evaluated with contrast-enhanced CT or MRI to exclude focal hepatic lesion. Electronically Signed   By: Rolm Baptise M.D.   On: 04/12/2018 22:05    Assessment and plan- Patient is a 83 y.o. female with history of metastatic esophageal cancer, currently on second line chemotherapy, history of melanoma, heart murmur, COPD, OSA, or sent to emergency room due to altered mental status.  #UTI, pending blood culture and urine culture.  Continue IV ceftriaxone #Altered mental status, ? acute infection/metabolic encephalopathy.  On antibiotics since last evening, mental status has not improved.  Recommend check CT brain wo.   #  Leukopenia, likely chemotherapy induced. Will check differential. If ANC is low, will start her on G-CSF support # Transaminitis/hyperbilirubinemia/dilated CBD Pending MR MRCP abdomen Appreciate GI input.   # Metastatic Esophageal cancer, follow up  outpatient after current admission. Awaiting MRI results.   Code status: DNI/DNI.   Thank you for allowing me to participate in the care of this patient.  Total face to face encounter time for this patient visit was 70 min. >50% of the time was  spent in counseling and coordination of care.    Earlie Server, MD, PhD Hematology Oncology [facility] at [facility] Pager- [phone] 04/13/2018

## 2018-04-13 NOTE — Consult Note (Signed)
[facility]  [phone] [phone]   Name: [name] Date: 04/13/2018 MRN: [id number]  DOB: 1935-01-27  Patient Care Team: Tonia Ghent, MD as PCP - General (Family Medicine)    REASON FOR CONSULTATION: Palliative Care consult requested for this 83 y.o. 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  Patient was also found to have increased bilirubin and transaminases with abdominal ultrasound revealing dilated CBD.  Work-up is ongoing.  Palliative care was consulted to help address goals.   SOCIAL HISTORY:    Patient is widowed.  She lives at home alone.  She has 2 sons, 1 of whom lives in [place] and the other in [place].  Patient is a retired [employer] account executive.  ADVANCE DIRECTIVES:  Patient's son, [name], is her healthcare power of attorney.  CODE STATUS: DNR  PAST MEDICAL HISTORY: Past Medical History:  Diagnosis Date  . Arthritis   . Asthma   . COPD (chronic obstructive pulmonary disease) ([place])   . Dysphonia   . Dyspnea   . Esophageal cancer ([place])   . GERD (gastroesophageal reflux disease)   . Heart murmur   . History of kidney stones   . Hypertension   . Hypokalemia 10/07/2017  . Melanoma ([place])   . OAB (overactive bladder)   . OSA on CPAP   . 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 Personal history of chemotherapy    esophageal cancer  . Personal history of radiation therapy    esophageal cancer  . Pupil asymmetry    R pupil defect  . Skin cancer   . Sleep apnea   . Spinal cord abscess     PAST SURGICAL HISTORY:  Past Surgical History:  Procedure Laterality Date  . ABDOMINAL HYSTERECTOMY    . ANTERIOR CERVICAL DECOMP/DISCECTOMY FUSION  N/A 07/16/2013   Procedure: ANTERIOR CERVICAL DECOMPRESSION/DISCECTOMY FUSION mutlipleLEVELS C4-7;  Surgeon: Erline Levine, MD;  Location: [facility];  Service: Neurosurgery;  Laterality: N/A;  . APPENDECTOMY    . carpel tunn Bilateral   . CATARACT EXTRACTION    . CATARACT EXTRACTION, BILATERAL    . CHOLECYSTECTOMY    . dental implant    . ESOPHAGOGASTRODUODENOSCOPY Left 03/21/2017   Procedure: ESOPHAGOGASTRODUODENOSCOPY (EGD);  Surgeon: Virgel Manifold, MD;  Location: [facility];  Service: Endoscopy;  Laterality: Left;  . ESOPHAGOGASTRODUODENOSCOPY (EGD) WITH PROPOFOL N/A 08/20/2017   Procedure: ESOPHAGOGASTRODUODENOSCOPY (EGD) WITH PROPOFOL;  Surgeon: Virgel Manifold, MD;  Location: [facility] ENDOSCOPY;  Service: Endoscopy;  Laterality: N/A;  . ESOPHAGOGASTRODUODENOSCOPY (EGD) WITH PROPOFOL N/A 09/19/2017   Procedure: ESOPHAGOGASTRODUODENOSCOPY (EGD) WITH PROPOFOL;  Surgeon: Virgel Manifold, MD;  Location: [facility] END[facility]COPY;  Service: Endoscopy;  Laterality: N/A;  . ESOPHAGOGASTRODUODENOSCOPY (EGD) WITH PROPOFOL N/A 10/03/2017   Procedure: ESOPHAGOGASTRODUODENOSCOPY (EGD) WITH PROPOFOL;  Surgeon: Virgel Manifold, MD;  Location: [facility] ENDOSCOPY;  Service: Endoscopy;  Laterality: N/A;  . ESOPHAGOGASTRODUODENOSCOPY (EGD) WITH PROPOFOL N/A 10/22/2017   Procedure: ESOPHAGOGASTRODUODENOSCOPY (EGD) WITH PROPOFOL;  Surgeon: Virgel Manifold, MD;  Location: [facility] ENDOSCOPY;  Service: Endoscopy;  Laterality: N/A;  . ESOPHAGOGASTRODUODENOSCOPY (EGD) WITH PROPOFOL N/A 11/18/2017   Procedure: ESOPHAGOGASTRODUODENOSCOPY (EGD) WITH PROPOFOL;  Surgeon: Virgel Manifold, MD;  Location: [facility] END[facility]COPY;  Service: Endoscopy;  Laterality:  N/A;  . ESOPHAGOGASTRODUODENOSCOPY (EGD) WITH PROPOFOL N/A 12/17/2017   Procedure: ESOPHAGOGASTRODUODENOSCOPY (EGD) WITH PROPOFOL;  Surgeon: Virgel Manifold, MD;  Location: [facility];  Service: Endoscopy;  Laterality: N/A;  . ESOPHAGOGASTRODUODENOSCOPY  (EGD) WITH PROPOFOL N/A 01/08/2018   Procedure: ESOPHAGOGASTRODUODENOSCOPY (EGD) WITH PROPOFOL;  Surgeon: Virgel Manifold, MD;  Location: [facility];  Service: Endoscopy;  Laterality: N/A;  . ESOPHAGOGASTRODUODENOSCOPY (EGD) WITH PROPOFOL N/A 01/22/2018   Procedure: ESOPHAGOGASTRODUODENOSCOPY (EGD) WITH PROPOFOL;  Surgeon: Virgel Manifold, MD;  Location: [facility] END[facility]COPY;  Service: Endoscopy;  Laterality: N/A;  . EYE SURGERY    . GASTRIC BYPASS    . HERNIA REPAIR    . HIATAL HERNIA REPAIR    . JOINT REPLACEMENT    . MELANOMA EXCISION    . PORTA CATH INSERTION N/A 04/07/2017   Procedure: PORTA CATH INSERTION;  Surgeon: Algernon Huxley, MD;  Location: [facility];  Service: Cardiovascular;  Laterality: N/A;  . POSTERIOR CERVICAL FUSION/FORAMINOTOMY N/A 07/28/2013   Procedure: Cervical four-seven  posterior cervical fusion;  Surgeon: Erline Levine, MD;  Location: [facility];  Service: Neurosurgery;  Laterality: N/A;  . TONSILLECTOMY    . TOTAL KNEE ARTHROPLASTY      HEMATOLOGY/ONCOLOGY HISTORY:  Oncology History   Patient is a 84 year old female who has metastatic esophageal cancer with liver involvement.  Weight loss 20 pounds for the past year prior to diagnosis.  #EGD during admission showed partially obstructive esophageal mass at the distal part of esophagus. Biopsy was taken. Pathology showed esophageal adenocarcinoma. #US biopsy of liver lesion to proof distant metastasis.  Report family history of breast cancer,but she has been having routine mammograms.  # Lives alone.She's a former smoker.  # Molecular Testing:  #[employer] Cdx: No reportable alternations with companion diagnostic (CDx) claims.  MS stable Tumor mutation burden: Cannot be determined, CCND3 amplification: Equivocal.  Amended reports on 06/04/2017, Tumor mutational burden changed from can not be determined to "TMB -low" on the re-analyzed samples.  # HER 2 negative.   Current Treatment S/p 6  cycles of FOLFOX, PET scan showed excellent partial response from both chemotherapy and radiation, result was discussed with patient.  currently on 5-FU maintenance.  S/p palliative radiation in January 2019 # Developed esophageal stricture secondary to radiation in April and got dilation via endoscopy. EGD shoed Benign-appearing esophageal stenosis. This is a result of radiation therapy.- LA Grade C radiation esophagitis. - The previously noted esophageal mass in Nov 2018 was much decreased indicating good response to radiation/chemotherapy. - Normal stomach.- Normal examined jejunum.- No specimens collected.        Malignant neoplasm of lower third of esophagus (HCC)   03/29/2017 Initial Diagnosis    Malignant neoplasm of lower third of esophagus ([place])    01/26/2018 -  Chemotherapy    The patient had PACLitaxel (TAXOL) 162 mg in sodium chloride 0.9 % 250 mL chemo infusion (</= 32m/m2), 80 mg/m2 = 162 mg, Intravenous,  Once, 3 of 6 cycles Dose modification: 65 mg/m2 (original dose 80 mg/m2, Cycle 2, Reason: Provider Judgment) Administration: 162 mg (01/27/2018), 162 mg (03/02/2018), 132 mg (03/09/2018), 162 mg (02/03/2018), 162 mg (02/09/2018), 162 mg (02/23/2018), 162 mg (03/23/2018), 162 mg (03/30/2018), 162 mg (04/06/2018) ramucirumab (CYRAMZA) 700 mg in sodium chloride 0.9 % 180 mL chemo infusion, 8 mg/kg = 700 mg, Intravenous, Once, 3 of 6 cycles Administration: 700 mg (01/27/2018), 700 mg (03/09/2018), 700 mg (02/09/2018), 700 mg (02/23/2018), 700 mg (03/23/2018), 700 mg (04/06/2018)  for chemotherapy treatment.  Esophageal adenocarcinoma ([place])    Initial Diagnosis    Esophageal adenocarcinoma (HCC)     ALLERGIES:  is allergic to cefuroxime; codeine; doxycycline; gabapentin; iohexol; other; oxycodone; quinolones; and synvisc [hylan g-f 20].  MEDICATIONS:  Current Facility-Administered Medications  Medication Dose Route Frequency Provider Last Rate Last Dose  . cefTRIAXone  (ROCEPHIN) 1 g in sodium chloride 0.9 % 100 mL IVPB  1 g Intravenous Q24H Lance Coon, MD      . enoxaparin (LOVENOX) injection 40 mg  40 mg Subcutaneous Q24H Lance Coon, MD      . labetalol (NORMODYNE,TRANDATE) injection 10 mg  10 mg Intravenous Q2H PRN Lance Coon, MD   10 mg at 04/13/18 0003  . metoprolol succinate (TOPROL-XL) 24 hr tablet 50 mg  50 mg Oral Daily Lance Coon, MD   50 mg at 04/13/18 1100  . morphine 2 MG/ML injection 2 mg  2 mg Intravenous Q4H PRN Lance Coon, MD   2 mg at 04/13/18 0542  . ondansetron (ZOFRAN) tablet 4 mg  4 mg Oral Q6H PRN Lance Coon, MD       Or  . ondansetron [facility]) injection 4 mg  4 mg Intravenous Q6H PRN Lance Coon, MD      . pantoprazole (PROTONIX) EC tablet 20 mg  20 mg Oral BID Kenn File, MD   20 mg at 04/13/18 1100  . pregabalin (LYRICA) capsule 150 mg  150 mg Oral BID Lance Coon, MD   150 mg at 04/13/18 1100  . senna-docusate (Senokot-S) tablet 1 tablet  1 tablet Oral QHS PRN Lance Coon, MD      . traZODone (DESYREL) tablet 50 mg  50 mg Oral QHS PRN Lance Coon, MD   50 mg at 04/13/18 0007   Facility-Administered Medications Ordered in Other Encounters  Medication Dose Route Frequency Provider Last Rate Last Dose  . sodium chloride flush (NS) 0.9 % injection 10 mL  10 mL Intravenous PRN Earlie Server, MD   10 mL at 01/26/18 0904    VITAL SIGNS: BP (!) 168/84   Pulse 93   Temp (!) 101.8 F (38.8 C) (Oral)   Resp 20   Ht '5\' 2"'  (1.575 m)   Wt 205 lb (93 kg)   SpO2 97%   BMI 37.49 kg/m  Filed Weights   04/12/18 1857 04/12/18 2105  Weight: 203 lb 4.2 oz (92.2 kg) 205 lb (93 kg)    Estimated body mass index is 37.49 kg/m as calculated from the following:   Height as of this encounter: '5\' 2"'  (1.575 m).   Weight as of this encounter: 205 lb (93 kg).  LABS: CBC:    Component Value Date/Time   WBC 2.5 (L) 04/13/2018 0603   HGB 11.2 (L) 04/13/2018 0603   HGB 12.9 07/13/2013 0609   HCT 34.6 (L) 04/13/2018 0603    HCT 38.8 07/13/2013 0609   PLT 137 (L) 04/13/2018 0603   PLT 212 07/13/2013 0609   MCV 102.4 (H) 04/13/2018 0603   MCV 93 07/13/2013 0609   NEUTROABS 1.8 04/13/2018 0603   LYMPHSABS 0.3 (L) 04/13/2018 0603   MONOABS 0.3 04/13/2018 0603   EOSABS 0.0 04/13/2018 0603   BASOSABS 0.0 04/13/2018 0603   Comprehensive Metabolic Panel:    Component Value Date/Time   NA 140 04/13/2018 0603   NA 138 07/13/2013 0609   K 3.4 (L) 04/13/2018 0603   K 4.1 07/13/2013 0609   CL 103 04/13/2018 0603   CL 104 07/13/2013 5176  CO2 28 04/13/2018 0603   CO2 29 07/13/2013 0609   BUN 12 04/13/2018 0603   BUN 15 07/13/2013 0609   CREATININE 0.64 04/13/2018 0603   CREATININE 0.67 07/13/2013 0609   GLUCOSE 110 (H) 04/13/2018 0603   GLUCOSE 115 (H) 07/13/2013 0609   CALCIUM 8.0 (L) 04/13/2018 0603   CALCIUM 8.3 (L) 07/13/2013 0609   AST 329 (H) 04/13/2018 0603   ALT 250 (H) 04/13/2018 0603   ALKPHOS 152 (H) 04/13/2018 0603   BILITOT 1.0 04/13/2018 0603   PROT 5.7 (L) 04/13/2018 0603   ALBUMIN 2.8 (L) 04/13/2018 0603    RADIOGRAPHIC STUDIES: Dg Chest 1 View  Result Date: 04/12/2018 CLINICAL DATA:  Acute mental status change EXAM: CHEST  1 VIEW COMPARISON:  March 20, 2017 FINDINGS: A right Port-A-Cath terminates in the central SVC. No pneumothorax. Cardiomegaly. Atelectasis or scar in the left base. No pulmonary nodules, masses, or suspicious infiltrate. IMPRESSION: No active disease. Electronically Signed   By: Dorise Bullion III M.D   On: 04/12/2018 19:55   Ct Head Wo Contrast  Result Date: 04/13/2018 CLINICAL DATA:  83 y/o F; history of metastatic esophageal cancer. Patient presents with altered mental status. EXAM: CT HEAD WITHOUT CONTRAST TECHNIQUE: Contiguous axial images were obtained from the base of the skull through the vertex without intravenous contrast. COMPARISON:  01/23/2018 PET-CT. FINDINGS: Brain: No evidence of acute infarction, hemorrhage, hydrocephalus, extra-axial collection  or mass lesion/mass effect. Few nonspecific foci of hypodensity within white matter likely represent mild chronic microvascular ischemic changes and there is mild volume loss of the brain. Vascular: Calcific atherosclerosis of the carotid siphons. No hyperdense vessel identified. Skull: Normal. Negative for fracture or focal lesion. Sinuses/Orbits: Small fluid level within the left sphenoid sinus. Paranasal sinuses and mastoid air cells are otherwise normally aerated. Bilateral intra-ocular lens replacement. Other: None. IMPRESSION: 1. No acute intracranial abnormality identified. 2. Mild chronic microvascular ischemic changes and mild volume loss of the brain. 3. Small fluid level within left sphenoid sinus may represent acute sinusitis in the appropriate clinical setting. 4. No gross mass effect to indicate metastatic disease. Small metastasis may not be visible with noncontrast CT. MRI of the brain with and without contrast is the study of choice to evaluate for intracranial metastatic disease. Electronically Signed   By: Kristine Garbe M.D.   On: 04/13/2018 14:27   US Abdomen Limited Ruq  Result Date: 04/12/2018 CLINICAL DATA:  Elevated LFTs EXAM: ULTRASOUND ABDOMEN LIMITED RIGHT UPPER QUADRANT COMPARISON:  CT 01/23/2018 FINDINGS: Gallbladder: Prior cholecystectomy Common bile duct: Diameter: Dilated, 10 mm. The distal duct cannot be visualized due to overlying bowel gas. Liver: Intrahepatic biliary ductal dilatation. There is a 2 cm cyst in the right lobe of the liver. Increased heterogeneous echotexture throughout the liver suggesting fatty infiltration. Possible solid lesion in the right hepatic lobe measuring up to 2.4 cm which is isoechoic. It is difficult to definitively confirm given its isoechoic appearance. Portal vein is patent on color Doppler imaging with normal direction of blood flow towards the liver. IMPRESSION: Dilated common bile duct, measuring 10 mm. This may be related to post  cholecystectomy state. If there is a concern for biliary obstruction, MRCP or ERCP may be beneficial. Heterogeneous, increased echotexture throughout the liver suggesting fatty infiltration or intrinsic liver disease. Possible isoechoic solid lesion in the right hepatic lobe measuring 2.4 cm versus an area of heterogeneity. This could be further evaluated with contrast-enhanced CT or MRI to exclude focal hepatic lesion. Electronically Signed  By: Rolm Baptise M.D.   On: 04/12/2018 22:05    PERFORMANCE STATUS (ECOG) : 1 - Symptomatic but completely ambulatory  Review of Systems As noted above. Otherwise, a complete review of systems is negative.  Physical Exam General: Frail appearing, obese Pulmonary: Unlabored Extremities: no edema Skin: no rashes Neurological: Weakness, some confusion, alert, answers questions  IMPRESSION: Patient appears to have some clinical improvement with less confusion this afternoon.  Confusion likely reflects acute delirium from the infection.  Transaminases seem to be down trending.  Patient is pending MRCP.  I met in private with patient's son, Clare Gandy.  Patient's other son, [name], who is her healthcare power of attorney, was not present.  Clare Gandy says that he feels patient is doing much better this afternoon compared to this morning.  He verbalized feeling cautiously optimistic regarding her prognosis.  However, he says that he knows that patient will "not live forever".  He was tearful as he described not being emotionally ready for her to decline.  He cites examples in the past of previous catastrophic healthcare events from which patient did remarkably well.  He hopes that she will continue doing well in the future.  Prior to this hospitalization, patient was living at home alone and was fully independent.  She still drove.  Patient went white water rafting in [place] over the summer.  She was also still actively engaged in her Spanish club.  Son describes quality of life  as being very important to the patient.  We discussed how quality of life can be impacted by treatment decisions and the importance of assessing patient's goals as we move forward with care.  PLAN: Continue medical treatment Recommend PT consult Will follow  Time Total: 50 minutes  Visit consisted of counseling and education dealing with the complex and emotionally intense issues of symptom management and palliative care in the setting of serious and potentially life-threatening illness.Greater than 50%  of this time was spent counseling and coordinating care related to the above assessment and plan.  Signed by: Altha Harm, PhD, NP-C [phone] (Work Cell)

## 2018-04-13 NOTE — Consult Note (Signed)
Cephas Darby, MD [address]  [place]  [place], [place] [address]  Main: [phone]  Fax: [phone] Pager: [phone]   Consultation  Referring Provider:     No ref. provider found Primary Care Physician:  Tonia Ghent, MD Primary Gastroenterologist:  Dr. Bonna Gains         Reason for Consultation:     Elevated LFTs  Date of Admission:  04/12/2018 Date of Consultation:  04/13/2018         HPI:   [name] is a 83 y.o. female with metastatic esophageal cancer with liver involvement on palliative chemo, radiation treatment, complicated by radiation-induced esophageal stricture status post dilation admitted with altered mental status.  Patient is admitted with sepsis secondary to UTI and was found to have elevated LFTs, ultrasound abdomen revealed CBD 10 mm, status post cholecystectomy.  Her bilirubin is mildly elevated to 1.4, decreased to 1 today.  Her transaminases have improved since yesterday.  Currently, alkaline phosphatase 152, AST 329, ALT 250.  Patient received chemotherapy on 04/06/2018 at cancer center.  Patient son and sister-in-law are bedside who provided most of the history as patient was altered.  She is awaiting MRCP to evaluate for any bile duct obstruction.  Patient had cholecystectomy more than 30 years ago.   NSAIDs: None  Antiplts/Anticoagulants/Anti thrombotics: None  GI Procedures: Reviewed  Past Medical History:  Diagnosis Date  . Arthritis   . Asthma   . COPD (chronic obstructive pulmonary disease) ([place])   . Dysphonia   . Dyspnea   . Esophageal cancer ([place])   . GERD (gastroesophageal reflux disease)   . Heart murmur   . History of kidney stones   . Hypertension   . Hypokalemia 10/07/2017  . Melanoma ([place])   . OAB (overactive bladder)   . OSA on CPAP   . 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 Personal history of chemotherapy    esophageal cancer  . Personal history of radiation therapy    esophageal  cancer  . Pupil asymmetry    R pupil defect  . Skin cancer   . Sleep apnea   . Spinal cord abscess     Past Surgical History:  Procedure Laterality Date  . ABDOMINAL HYSTERECTOMY    . ANTERIOR CERVICAL DECOMP/DISCECTOMY FUSION N/A 07/16/2013   Procedure: ANTERIOR CERVICAL DECOMPRESSION/DISCECTOMY FUSION mutlipleLEVELS C4-7;  Surgeon: Erline Levine, MD;  Location: [facility];  Service: Neurosurgery;  Laterality: N/A;  . APPENDECTOMY    . carpel tunn Bilateral   . CATARACT EXTRACTION    . CATARACT EXTRACTION, BILATERAL    . CHOLECYSTECTOMY    . dental implant    . ESOPHAGOGASTRODUODENOSCOPY Left 03/21/2017   Procedure: ESOPHAGOGASTRODUODENOSCOPY (EGD);  Surgeon: Virgel Manifold, MD;  Location: [facility];  Service: Endoscopy;  Laterality: Left;  . ESOPHAGOGASTRODUODENOSCOPY (EGD) WITH PROPOFOL N/A 08/20/2017   Procedure: ESOPHAGOGASTRODUODENOSCOPY (EGD) WITH PROPOFOL;  Surgeon: Virgel Manifold, MD;  Location: [facility] ENDOSCOPY;  Service: Endoscopy;  Laterality: N/A;  . ESOPHAGOGASTRODUODENOSCOPY (EGD) WITH PROPOFOL N/A 09/19/2017   Procedure: ESOPHAGOGASTRODUODENOSCOPY (EGD) WITH PROPOFOL;  Surgeon: Virgel Manifold, MD;  Location: [facility] ENDOSCOPY;  Service: Endoscopy;  Laterality: N/A;  . ESOPHAGOGASTRODUODENOSCOPY (EGD) WITH PROPOFOL N/A 10/03/2017   Procedure: ESOPHAGOGASTRODUODENOSCOPY (EGD) WITH PROPOFOL;  Surgeon: Virgel Manifold, MD;  Location: [facility] ENDOSCOPY;  Service: Endoscopy;  Laterality: N/A;  . ESOPHAGOGASTRODUODENOSCOPY (EGD) WITH PROPOFOL N/A 10/22/2017   Procedure: ESOPHAGOGASTRODUODENOSCOPY (EGD) WITH PROPOFOL;  Surgeon: Virgel Manifold, MD;  Location: [facility] ENDOSCOPY;  Service: Endoscopy;  Laterality: N/A;  . ESOPHAGOGASTRODUODENOSCOPY (EGD) WITH PROPOFOL N/A 11/18/2017   Procedure: ESOPHAGOGASTRODUODENOSCOPY (EGD) WITH PROPOFOL;  Surgeon: Virgel Manifold, MD;  Location: [facility] ENDOSCOPY;  Service: Endoscopy;  Laterality: N/A;  .  ESOPHAGOGASTRODUODENOSCOPY (EGD) WITH PROPOFOL N/A 12/17/2017   Procedure: ESOPHAGOGASTRODUODENOSCOPY (EGD) WITH PROPOFOL;  Surgeon: Virgel Manifold, MD;  Location: [facility] ENDOSCOPY;  Service: Endoscopy;  Laterality: N/A;  . ESOPHAGOGASTRODUODENOSCOPY (EGD) WITH PROPOFOL N/A 01/08/2018   Procedure: ESOPHAGOGASTRODUODENOSCOPY (EGD) WITH PROPOFOL;  Surgeon: Virgel Manifold, MD;  Location: [facility] ENDOSCOPY;  Service: Endoscopy;  Laterality: N/A;  . ESOPHAGOGASTRODUODENOSCOPY (EGD) WITH PROPOFOL N/A 01/22/2018   Procedure: ESOPHAGOGASTRODUODENOSCOPY (EGD) WITH PROPOFOL;  Surgeon: Virgel Manifold, MD;  Location: [facility] ENDOSCOPY;  Service: Endoscopy;  Laterality: N/A;  . EYE SURGERY    . GASTRIC BYPASS    . HERNIA REPAIR    . HIATAL HERNIA REPAIR    . JOINT REPLACEMENT    . MELANOMA EXCISION    . PORTA CATH INSERTION N/A 04/07/2017   Procedure: PORTA CATH INSERTION;  Surgeon: Algernon Huxley, MD;  Location: [place] [facility] CV LAB;  Service: Cardiovascular;  Laterality: N/A;  . POSTERIOR CERVICAL FUSION/FORAMINOTOMY N/A 07/28/2013   Procedure: Cervical four-seven  posterior cervical fusion;  Surgeon: Erline Levine, MD;  Location: [facility]UR[facility];  Service: Neurosurgery;  Laterality: N/A;  . TONSILLECTOMY    . TOTAL KNEE ARTHROPLASTY      Prior to Admission medications   Medication Sig Start Date End Date Taking? Authorizing Provider  acetaminophen (TYLENOL) 325 MG tablet Take 650 mg by mouth every 6 (six) hours as needed for moderate pain or headache.    Yes [provider]  albuterol (PROVENTIL HFA;VENTOLIN HFA) 108 (90 Base) MCG/ACT inhaler Inhale 2 puffs into the lungs every 4 (four) hours as needed for wheezing or shortness of breath.   Yes [provider]  calcium carbonate (TUMS EX) 750 MG chewable tablet Chew 1 tablet by mouth as needed for heartburn.    Yes [provider]  CARAFATE 1 GM/10ML suspension TAKE 10MLS BY MOUTH FOUR TIMES DAILY Patient taking differently:  Take 1 g by mouth 4 (four) times daily.  10/28/17  Yes Virgel Manifold, MD  chlorhexidine (PERIDEX) 0.12 % solution Use as directed 15 mLs in the mouth or throat 2 (two) times daily.   Yes [provider]  Cholecalciferol (VITAMIN D) 2000 UNITS CAPS Take 2,000 Units by mouth daily.   Yes [provider]  cyanocobalamin 500 MCG tablet Take 500 mcg every other day by mouth.   Yes [provider]  Diclofenac Sodium 1 % CREA Apply to left arm as needed for pain three times daily 04/23/17  Yes Earlie Server, MD  dronabinol (MARINOL) 5 MG capsule Take 1 capsule (5 mg total) by mouth 2 (two) times daily before a meal. 01/26/18  Yes Earlie Server, MD  fluticasone [facility]) 50 MCG/ACT nasal spray Place 1-2 sprays into both nostrils daily. 06/13/17  Yes Tonia Ghent, MD  furosemide (LASIX) 20 MG tablet Take 1 tablet (20 mg total) by mouth daily. 08/14/17  Yes Minna Merritts, MD  hydrocortisone cream 1 % Apply 1 application topically daily as needed for itching.    Yes [provider]  loratadine (CLARITIN) 10 MG tablet Take 10 mg by mouth daily.   Yes [provider]  LYRICA 150 MG capsule TAKE 1 CAPSULE BY MOUTH TWO TIMES DAILY  Patient taking differently: Take 150 mg by mouth 2 (two) times daily.  01/06/17  Yes Tonia Ghent, MD  metoprolol succinate (TOPROL-XL) 50 MG 24 hr tablet TAKE 1 TABLET BY MOUTH DAILY 10/28/17  Yes Tonia Ghent, MD  mirabegron ER (MYRBETRIQ) 25 MG TB24 tablet Take 1 tablet (25 mg total) by mouth daily. 03/15/18  Yes Tonia Ghent, MD  Multiple Vitamins-Minerals (MULTIVITAMIN ADULT) CHEW Chew 1 tablet by mouth daily.    Yes [provider]  pantoprazole (PROTONIX) 40 MG tablet TAKE ONE TABLET TWICE DAILY BEFORE A MEAL Patient taking differently: Take 40 mg by mouth 2 (two) times daily before a meal.  03/31/18  Yes Earlie Server, MD  Polyethyl Glycol-Propyl Glycol (SYSTANE OP) Apply 1 drop to eye daily as needed (dry eyes).   Yes  [provider]  potassium chloride (KLOR-CON) 20 MEQ packet Take 20 mEq by mouth daily. 03/09/18  Yes Earlie Server, MD  pyridOXINE (VITAMIN B-6) 100 MG tablet Take 1 tablet (100 mg total) by mouth daily. 04/28/17  Yes Earlie Server, MD  senna-docusate (SENOKOT-S) 8.6-50 MG tablet Take 1 tablet at bedtime as needed by mouth for mild constipation. 03/22/17  Yes Gouru, Illene Silver, MD  traMADol (ULTRAM) 50 MG tablet Take 1 tablet (50 mg total) by mouth every 6 (six) hours as needed. 11/28/17  Yes Earlie Server, MD  traZODone (DESYREL) 50 MG tablet TAKE 1/2 TABLET BY MOUTH AT BEDTIME IF NEEDED FOR SLEEP Patient taking differently: Take 25 mg by mouth at bedtime as needed for sleep.  04/17/17  Yes Tonia Ghent, MD  prochlorperazine (COMPAZINE) 10 MG tablet Take 1 tablet (10 mg total) by mouth every 6 (six) hours as needed (Nausea or vomiting). Patient not taking: Reported on 01/22/2018 06/30/17 01/26/18  Earlie Server, MD    Current Facility-Administered Medications:  .  cefTRIAXone (ROCEPHIN) 1 g in sodium chloride 0.9 % 100 mL IVPB, 1 g, Intravenous, Q24H, Lance Coon, MD .  enoxaparin (LOVENOX) injection 40 mg, 40 mg, Subcutaneous, Q24H, Lance Coon, MD .  labetalol (NORMODYNE,TRANDATE) injection 10 mg, 10 mg, Intravenous, Q2H PRN, Lance Coon, MD, 10 mg at 04/13/18 0003 .  metoprolol succinate (TOPROL-XL) 24 hr tablet 50 mg, 50 mg, Oral, Daily, Lance Coon, MD, 50 mg at 04/13/18 1100 .  morphine 2 MG/ML injection 2 mg, 2 mg, Intravenous, Q4H PRN, Lance Coon, MD, 2 mg at 04/13/18 0542 .  ondansetron (ZOFRAN) tablet 4 mg, 4 mg, Oral, Q6H PRN **OR** ondansetron (ZOFRAN) injection 4 mg, 4 mg, Intravenous, Q6H PRN, Lance Coon, MD .  pantoprazole (PROTONIX) EC tablet 20 mg, 20 mg, Oral, BID Kenn File, MD, 20 mg at 04/13/18 1100 .  pregabalin (LYRICA) capsule 150 mg, 150 mg, Oral, BID, Lance Coon, MD, 150 mg at 04/13/18 1100 .  senna-docusate (Senokot-S) tablet 1 tablet, 1 tablet, Oral, QHS PRN,  Lance Coon, MD .  traZODone (DESYREL) tablet 50 mg, 50 mg, Oral, QHS PRN, Lance Coon, MD, 50 mg at 04/13/18 0007  Facility-Administered Medications Ordered in Other Encounters:  .  sodium chloride flush (NS) 0.9 % injection 10 mL, 10 mL, Intravenous, PRN, Earlie Server, MD, 10 mL at 01/26/18 7829  Family History  Problem Relation Age of Onset  . Breast cancer Mother 59  . Arthritis-Osteo Mother   . Breast cancer Sister 31  . Bladder Cancer Sister   . Bladder Cancer Father   . Tongue cancer Father   . Congenital heart disease Father   .  Prostate cancer Brother   . Uterine cancer Maternal Aunt   . Lung cancer Paternal Uncle   . Leukemia Maternal Grandmother   . Liver cancer Paternal Grandmother   . Colon cancer Neg Hx      Social History   Tobacco Use  . Smoking status: Former Smoker    Packs/day: 1.00    Years: 20.00    Pack years: 20.00    Types: Cigarettes    Last attempt to quit: 1977    Years since quitting: 42.9  . Smokeless tobacco: Never Used  Substance Use Topics  . Alcohol use: No  . Drug use: Never    Allergies as of 04/12/2018 - Review Complete 04/12/2018  Allergen Reaction Noted  . Cefuroxime Nausea Only 07/11/2015  . Codeine Nausea Only 01/22/2011  . Doxycycline Other (See Comments) 12/03/2013  . Gabapentin Other (See Comments) 12/19/2016  . Iohexol Itching 07/15/2013  . Other Other (See Comments) 12/03/2013  . Oxycodone Other (See Comments) 12/19/2016  . Quinolones Swelling 12/03/2013  . Synvisc [hylan g-f 20] Swelling 01/22/2011    Review of Systems:    All systems reviewed and negative except where noted in HPI.   Physical Exam:  Vital signs in last 24 hours: Temp:  [98.3 F (36.8 C)-102.9 F (39.4 C)] 101.8 F (38.8 C) (12/02 0824) Pulse Rate:  [74-93] 93 (12/02 0824) Resp:  [14-29] 20 (12/02 0824) BP: (126-178)/(51-94) 168/84 (12/02 0824) SpO2:  [94 %-100 %] 97 % (12/02 0824) Weight:  [92.2 kg-93 kg] 93 kg (12/01 2105) Last BM Date:  04/12/18 General:   Pleasant, cooperative in NAD Head:  Normocephalic and atraumatic. Eyes:   No icterus.   Conjunctiva pink. PERRLA. Ears:  Normal auditory acuity. Neck:  Supple; no masses or thyroidomegaly Lungs: Respirations even and unlabored. Lungs clear to auscultation bilaterally.   No wheezes, crackles, or rhonchi.  Heart:  Regular rate and rhythm;  Without murmur, clicks, rubs or gallops Abdomen:  Soft, nondistended, nontender. Normal bowel sounds. No appreciable masses or hepatomegaly.  No rebound or guarding.  Rectal:  Not performed. Msk:  Symmetrical without gross deformities.  Strength generalized weakness Extremities:  Without edema, cyanosis or clubbing. Neurologic:  Alert and oriented x3;  grossly normal neurologically. Skin:  Intact without significant lesions or rashes. Psych:  Alert and cooperative. Normal affect.  LAB RESULTS: CBC Latest Ref Rng & Units 04/13/2018 04/12/2018 04/06/2018  WBC 4.0 - 10.5 K/uL 2.5(L) 2.3(L) 3.5(L)  Hemoglobin 12.0 - 15.0 g/dL 11.2(L) 11.4(L) 11.9(L)  Hematocrit 36.0 - 46.0 % 34.6(L) 34.7(L) 37.3  Platelets 150 - 400 K/uL 137(L) 157 226    BMET BMP Latest Ref Rng & Units 04/13/2018 04/12/2018 04/06/2018  Glucose 70 - 99 mg/dL 110(H) 117(H) 177(H)  BUN 8 - 23 mg/dL _0 Creatinine 0.44 - 1.00 mg/dL 0.64 0.58 0.64  Sodium 135 - 145 mmol/L 140 140 144  Potassium 3.5 - 5.1 mmol/L 3.4(L) 3.5 3.6  Chloride 98 - 111 mmol/L 103 104 107  CO2 22 - 32 mmol/L _1 Calcium 8.9 - 10.3 mg/dL 8.0(L) 8.3(L) 8.9    LFT Hepatic Function Latest Ref Rng & Units 04/13/2018 04/12/2018 04/06/2018  Total Protein 6.5 - 8.1 g/dL 5.7(L) 6.2(L) 6.9  Albumin 3.5 - 5.0 g/dL 2.8(L) 3.1(L) 3.7  AST 15 - 41 U/L 329(H) 643(H) 33  ALT 0 - 44 U/L 250(H) 367(H) 22  Alk Phosphatase 38 - 126 U/L 152(H) 215(H) 66  Total Bilirubin 0.3 - 1.2 mg/dL  1.0 1.4(H) 0.6     STUDIES: Dg Chest 1 View  Result Date: 04/12/2018 CLINICAL DATA:  Acute mental status change  EXAM: CHEST  1 VIEW COMPARISON:  March 20, 2017 FINDINGS: A right Port-A-Cath terminates in the central SVC. No pneumothorax. Cardiomegaly. Atelectasis or scar in the left base. No pulmonary nodules, masses, or suspicious infiltrate. IMPRESSION: No active disease. Electronically Signed   By: Dorise Bullion III M.D   On: 04/12/2018 19:55   Ct Head Wo Contrast  Result Date: 04/13/2018 CLINICAL DATA:  83 y/o F; history of metastatic esophageal cancer. Patient presents with altered mental status. EXAM: CT HEAD WITHOUT CONTRAST TECHNIQUE: Contiguous axial images were obtained from the base of the skull through the vertex without intravenous contrast. COMPARISON:  01/23/2018 PET-CT. FINDINGS: Brain: No evidence of acute infarction, hemorrhage, hydrocephalus, extra-axial collection or mass lesion/mass effect. Few nonspecific foci of hypodensity within white matter likely represent mild chronic microvascular ischemic changes and there is mild volume loss of the brain. Vascular: Calcific atherosclerosis of the carotid siphons. No hyperdense vessel identified. Skull: Normal. Negative for fracture or focal lesion. Sinuses/Orbits: Small fluid level within the left sphenoid sinus. Paranasal sinuses and mastoid air cells are otherwise normally aerated. Bilateral intra-ocular lens replacement. Other: None. IMPRESSION: 1. No acute intracranial abnormality identified. 2. Mild chronic microvascular ischemic changes and mild volume loss of the brain. 3. Small fluid level within left sphenoid sinus may represent acute sinusitis in the appropriate clinical setting. 4. No gross mass effect to indicate metastatic disease. Small metastasis may not be visible with noncontrast CT. MRI of the brain with and without contrast is the study of choice to evaluate for intracranial metastatic disease. Electronically Signed   By: Kristine Garbe M.D.   On: 04/13/2018 14:27   US Abdomen Limited Ruq  Result Date: 04/12/2018 CLINICAL  DATA:  Elevated LFTs EXAM: ULTRASOUND ABDOMEN LIMITED RIGHT UPPER QUADRANT COMPARISON:  CT 01/23/2018 FINDINGS: Gallbladder: Prior cholecystectomy Common bile duct: Diameter: Dilated, 10 mm. The distal duct cannot be visualized due to overlying bowel gas. Liver: Intrahepatic biliary ductal dilatation. There is a 2 cm cyst in the right lobe of the liver. Increased heterogeneous echotexture throughout the liver suggesting fatty infiltration. Possible solid lesion in the right hepatic lobe measuring up to 2.4 cm which is isoechoic. It is difficult to definitively confirm given its isoechoic appearance. Portal vein is patent on color Doppler imaging with normal direction of blood flow towards the liver. IMPRESSION: Dilated common bile duct, measuring 10 mm. This may be related to post cholecystectomy state. If there is a concern for biliary obstruction, MRCP or ERCP may be beneficial. Heterogeneous, increased echotexture throughout the liver suggesting fatty infiltration or intrinsic liver disease. Possible isoechoic solid lesion in the right hepatic lobe measuring 2.4 cm versus an area of heterogeneity. This could be further evaluated with contrast-enhanced CT or MRI to exclude focal hepatic lesion. Electronically Signed   By: Rolm Baptise M.D.   On: 04/12/2018 22:05      Impression / Plan:   [name] is a 83 y.o. female with stage IV esophageal adenocarcinoma status post chemo and XRT, radiation-induced esophageal stricture status post dilation admitted with sepsis and alimental status secondary to UTI.  Patient is currently being treated for UTI with ceftriaxone.  She is found to have elevated transaminases and GI is consulted for further evaluation.  LFTs are already improving. Elevated LFTs are most likely in the setting of sepsis and recent chemotherapy.  Mildly  elevated common bile duct is secondary to cholecystectomy.  Her bilirubin is normal today.  We will follow-up on the MRCP results to assess  the need for ERCP.  Recommend checking acute viral hepatitis panel including HSV  Thank you for involving me in the care of this patient.  Will follow along with you    LOS: 1 day   Sherri Sear, MD  04/13/2018, 4:34 PM   Note: This dictation was prepared with Dragon dictation along with smaller phrase technology. Any transcriptional errors that result from this process are unintentional.

## 2018-04-13 NOTE — Progress Notes (Signed)
Family Meeting Note  Advance Directive:yes  Today a meeting took place with the Patient and son at bedside.  Patient is unable to participate due NZ:VJKQAS capacity Mental status   The following clinical team members were present during this meeting:MD  The following were discussed:Patient's diagnosis: 83 y.o.femalewith history of metastatic esophageal cancer, currently on second line chemotherapy, history of melanoma, heart murmur, COPD, OSA,admitted for altered mental status   Past Medical History:  Diagnosis Date  . Arthritis   . Asthma   . COPD (chronic obstructive pulmonary disease) ([place])   . Dysphonia   . Dyspnea   . Esophageal cancer ([place])   . GERD (gastroesophageal reflux disease)   . Heart murmur   . History of kidney stones   . Hypertension   . Hypokalemia 10/07/2017  . Melanoma ([place])   . OAB (overactive bladder)   . OSA on CPAP   . 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 Personal history of chemotherapy    esophageal cancer  . Personal history of radiation therapy    esophageal cancer  . Pupil asymmetry    R pupil defect  . Skin cancer   . Sleep apnea   . Spinal cord abscess      , Patient's progosis: > 12 months and Goals for treatment: DNR  Additional follow-up to be provided: Palliative care c/s  Time spent during discussion:20 minutes  Max Sane, MD

## 2018-04-13 NOTE — Evaluation (Signed)
Clinical/Bedside Swallow Evaluation Patient Details  Name: [name] MRN: [id number] Date of Birth: 1934-06-29  Today's Date: 04/13/2018 Time: SLP Start Time (ACUTE ONLY): 0945 SLP Stop Time (ACUTE ONLY): 1045 SLP Time Calculation (min) (ACUTE ONLY): 60 min  Past Medical History:  Past Medical History:  Diagnosis Date  . Arthritis   . Asthma   . COPD (chronic obstructive pulmonary disease) ([place])   . Dysphonia   . Dyspnea   . Esophageal cancer ([place])   . GERD (gastroesophageal reflux disease)   . Heart murmur   . History of kidney stones   . Hypertension   . Hypokalemia 10/07/2017  . Melanoma ([place])   . OAB (overactive bladder)   . OSA on CPAP   . 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 Personal history of chemotherapy    esophageal cancer  . Personal history of radiation therapy    esophageal cancer  . Pupil asymmetry    R pupil defect  . Skin cancer   . Sleep apnea   . Spinal cord abscess    Past Surgical History:  Past Surgical History:  Procedure Laterality Date  . ABDOMINAL HYSTERECTOMY    . ANTERIOR CERVICAL DECOMP/DISCECTOMY FUSION N/A 07/16/2013   Procedure: ANTERIOR CERVICAL DECOMPRESSION/DISCECTOMY FUSION mutlipleLEVELS C4-7;  Surgeon: Erline Levine, MD;  Location: [place] [facility];  Service: Neurosurgery;  Laterality: N/A;  . APPENDECTOMY    . carpel tunn Bilateral   . CATARACT EXTRACTION    . CATARACT EXTRACTION, BILATERAL    . CHOLECYSTECTOMY    . dental implant    . ESOPHAGOGASTRODUODENOSCOPY Left 03/21/2017   Procedure: ESOPHAGOGASTRODUODENOSCOPY (EGD);  Surgeon: Virgel Manifold, MD;  Location: [facility] ENDOSCOPY;  Service: Endoscopy;  Laterality: Left;  . ESOPHAGOGASTRODUODENOSCOPY (EGD) WITH PROPOFOL N/A 08/20/2017   Procedure: ESOPHAGOGASTRODUODENOSCOPY (EGD) WITH PROPOFOL;  Surgeon: Virgel Manifold, MD;  Location: [facility] ENDOSCOPY;  Service: Endoscopy;  Laterality: N/A;  . ESOPHAGOGASTRODUODENOSCOPY (EGD) WITH PROPOFOL N/A 09/19/2017    Procedure: ESOPHAGOGASTRODUODENOSCOPY (EGD) WITH PROPOFOL;  Surgeon: Virgel Manifold, MD;  Location: [facility] ENDOSCOPY;  Service: Endoscopy;  Laterality: N/A;  . ESOPHAGOGASTRODUODENOSCOPY (EGD) WITH PROPOFOL N/A 10/03/2017   Procedure: ESOPHAGOGASTRODUODENOSCOPY (EGD) WITH PROPOFOL;  Surgeon: Virgel Manifold, MD;  Location: [facility] ENDOSCOPY;  Service: Endoscopy;  Laterality: N/A;  . ESOPHAGOGASTRODUODENOSCOPY (EGD) WITH PROPOFOL N/A 10/22/2017   Procedure: ESOPHAGOGASTRODUODENOSCOPY (EGD) WITH PROPOFOL;  Surgeon: Virgel Manifold, MD;  Location: [facility] ENDOSCOPY;  Service: Endoscopy;  Laterality: N/A;  . ESOPHAGOGASTRODUODENOSCOPY (EGD) WITH PROPOFOL N/A 11/18/2017   Procedure: ESOPHAGOGASTRODUODENOSCOPY (EGD) WITH PROPOFOL;  Surgeon: Virgel Manifold, MD;  Location: [facility] ENDOSCOPY;  Service: Endoscopy;  Laterality: N/A;  . ESOPHAGOGASTRODUODENOSCOPY (EGD) WITH PROPOFOL N/A 12/17/2017   Procedure: ESOPHAGOGASTRODUODENOSCOPY (EGD) WITH PROPOFOL;  Surgeon: Virgel Manifold, MD;  Location: [facility] ENDOSCOPY;  Service: Endoscopy;  Laterality: N/A;  . ESOPHAGOGASTRODUODENOSCOPY (EGD) WITH PROPOFOL N/A 01/08/2018   Procedure: ESOPHAGOGASTRODUODENOSCOPY (EGD) WITH PROPOFOL;  Surgeon: Virgel Manifold, MD;  Location: [facility] ENDOSCOPY;  Service: Endoscopy;  Laterality: N/A;  . ESOPHAGOGASTRODUODENOSCOPY (EGD) WITH PROPOFOL N/A 01/22/2018   Procedure: ESOPHAGOGASTRODUODENOSCOPY (EGD) WITH PROPOFOL;  Surgeon: Virgel Manifold, MD;  Location: [facility] ENDOSCOPY;  Service: Endoscopy;  Laterality: N/A;  . EYE SURGERY    . GASTRIC BYPASS    . HERNIA REPAIR    . HIATAL HERNIA REPAIR    . JOINT REPLACEMENT    . MELANOMA EXCISION    . PORTA CATH INSERTION N/A 04/07/2017   Procedure: PORTA CATH INSERTION;  Surgeon: Algernon Huxley, MD;  Location: [place] CV LAB;  Service: Cardiovascular;  Laterality: N/A;  . POSTERIOR CERVICAL FUSION/FORAMINOTOMY N/A 07/28/2013   Procedure: Cervical four-seven   posterior cervical fusion;  Surgeon: Erline Levine, MD;  Location: [place] [facility];  Service: Neurosurgery;  Laterality: N/A;  . TONSILLECTOMY    . TOTAL KNEE ARTHROPLASTY     HPI:  Pt is a 83 y.o. female w/ multiple PMH who presents with chief complaint of AMS.  Patient is confused and unable to contribute to her HPI.  Family at bedside states that they found her at home in her bed in the afternoon in this state.  They tried to call her when she did not respond they went to her house.  She is normally active and her activities of daily living.  Son at bedside states that yesterday she also did not do as much as she normally does and was probably not feeling well.  She is also complained of some epigastric abdominal discomfort transiently, couple of times over the past week.  Here in the ED tonight, she is found to have UTI, met sepsis criteria.  She does have a history of esophageal adenocarcinoma with liver involvement, currently being treated on chemotherapy and followed by Oncology.  She is also found to have significant transaminitis in the ED compared to normal values about 6 to 7 days ago.  Abdominal ultrasound shows dilated common bile duct, patient is status post cholecystectomy.  Son denied pt having any Esophageal dysmotility deficits when eating foods/solids or liquids. He stated she ate a Regular diet "like normal" at home though she has lost weight over past few months. Pt is followed by Oncology for Esophageal CA.    Assessment / Plan / Recommendation Clinical Impression  Pt appeared to present w/ adequate oropharyngeal phase swallowing function w/ no overt s/s of aspiration noted during few po trials - pt NPO except for meds and sips of liquids for a test today. CXR revealed No active disease. She consumed trials of thin liquids via Cup/Straw and small amount of applesauce w/ no coughing or throat clearing noted; no decline in respiratory effort noted during/post trials. Laryngeal excusion  appeared [facility]; vocal quality clear post trials. Oral phase appeared [facility] for bolus management and oral clearing. OM exam revealed no umilateral lingual/labial weakness. Pt fed self w/ min+ assist given. She was easily distracted and required verbal cues to attend and follow through w/ po tasks. Recommend a fairly Regular diet w/ Meats Cut well, moistened w/ Gravy; Thin liquids w/ aspiratoin precautions. Recommended Pills Whole in puree for safer swallowing. Recommend feeding support and monitoring during all meals.  ST services will f/u w/ pt's toleration of diet and Esophageal comfort w/ the Regular diet consistency once she is able to take an oral diet per MD. NSG updated.  SLP Visit Diagnosis: Dysphagia, unspecified (R13.10)    Aspiration Risk  (reduced following precautions)    Diet Recommendation  Regular diet w/ Meats cut well, moistened foods; Thin liquids. General aspiration precautions; Supervision at meals to assist  Medication Administration: Whole meds with puree(as needed for safer swallowing)    Other  Recommendations Recommended Consults: (Dietician f/u as needed) Oral Care Recommendations: Oral care BID;Staff/trained caregiver to provide oral care Other Recommendations: (n/a)   Follow up Recommendations None      Frequency and Duration min 2x/week  1 week       Prognosis Prognosis for Safe Diet Advancement: Fair(-Good) Barriers to Reach  Goals: Cognitive deficits(min)      Swallow Study   General Date of Onset: 04/12/18 HPI: Pt is a 83 y.o. female w/ multiple PMH who presents with chief complaint of AMS.  Patient is confused and unable to contribute to her HPI.  Family at bedside states that they found her at home in her bed in the afternoon in this state.  They tried to call her when she did not respond they went to her house.  She is normally active and her activities of daily living.  Son at bedside states that yesterday she also did not do as much as she normally does  and was probably not feeling well.  She is also complained of some epigastric abdominal discomfort transiently, couple of times over the past week.  Here in the ED tonight, she is found to have UTI, met sepsis criteria.  She does have a history of esophageal adenocarcinoma with liver involvement, currently being treated on chemotherapy and followed by Oncology.  She is also found to have significant transaminitis in the ED compared to normal values about 6 to 7 days ago.  Abdominal ultrasound shows dilated common bile duct, patient is status post cholecystectomy.  Son denied pt having any Esophageal dysmotility deficits when eating foods/solids or liquids. He stated she ate a Regular diet "like normal" at home though she has lost weight over past few months. Pt is followed by Oncology for Esophageal CA.  Type of Study: Bedside Swallow Evaluation Previous Swallow Assessment: none reported Diet Prior to this Study: NPO(regular diet at home per Son) Temperature Spikes Noted: (wbc 2.5; temp 102.9 at admission) Respiratory Status: Nasal cannula(3 liters) History of Recent Intubation: No Behavior/Cognition: Alert;Cooperative;Pleasant mood;Confused;Distractible;Requires cueing Oral Cavity Assessment: Dry(sticky) Oral Care Completed by SLP: Yes Oral Cavity - Dentition: Adequate natural dentition Vision: Functional for self-feeding Self-Feeding Abilities: Able to feed self;Needs assist;Needs set up;Total assist Patient Positioning: Upright in bed(needed positioning - moving about often) Baseline Vocal Quality: Normal(adequate) Volitional Cough: Strong(cued) Volitional Swallow: Able to elicit(cued)    Oral/Motor/Sensory Function Overall Oral Motor/Sensory Function: Within functional limits(appeared WFL during bolus management, clearing)   Ice Chips Ice chips: Within functional limits Presentation: Spoon(fed; 6 trials) Other Comments: pt both masticated and sucked on the chips w/ appropriate management    Thin Liquid Thin Liquid: Within functional limits Presentation: Cup;Self Fed;Straw(assisted; 6 trials (2 cup, 4 straw)) Other Comments: min impulsive    Nectar Thick Nectar Thick Liquid: Not tested   Honey Thick Honey Thick Liquid: Not tested   Puree Puree: Within functional limits Presentation: Spoon(fed; 2 trials (1/2 tsp each))   Solid     Solid: Not tested Other Comments: d/t tests       Orinda Kenner, MS, CCC-SLP , 04/13/2018,2:29 PM

## 2018-04-14 DIAGNOSIS — C159 Malignant neoplasm of esophagus, unspecified: Secondary | ICD-10-CM

## 2018-04-14 DIAGNOSIS — N3000 Acute cystitis without hematuria: Secondary | ICD-10-CM

## 2018-04-14 DIAGNOSIS — G4733 Obstructive sleep apnea (adult) (pediatric): Secondary | ICD-10-CM

## 2018-04-14 DIAGNOSIS — J449 Chronic obstructive pulmonary disease, unspecified: Secondary | ICD-10-CM

## 2018-04-14 LAB — CBC
HCT: 33.9 % — ABNORMAL LOW (ref 36.0–46.0)
Hemoglobin: 10.9 g/dL — ABNORMAL LOW (ref 12.0–15.0)
MCH: 33.1 pg (ref 26.0–34.0)
MCHC: 32.2 g/dL (ref 30.0–36.0)
MCV: 103 fL — ABNORMAL HIGH (ref 80.0–100.0)
NRBC: 0 % (ref 0.0–0.2)
Platelets: 132 10*3/uL — ABNORMAL LOW (ref 150–400)
RBC: 3.29 MIL/uL — ABNORMAL LOW (ref 3.87–5.11)
RDW: 15.9 % — ABNORMAL HIGH (ref 11.5–15.5)
WBC: 2.1 10*3/uL — AB (ref 4.0–10.5)

## 2018-04-14 LAB — COMPREHENSIVE METABOLIC PANEL
ALBUMIN: 2.7 g/dL — AB (ref 3.5–5.0)
ALT: 152 U/L — ABNORMAL HIGH (ref 0–44)
AST: 135 U/L — ABNORMAL HIGH (ref 15–41)
Alkaline Phosphatase: 132 U/L — ABNORMAL HIGH (ref 38–126)
Anion gap: 8 (ref 5–15)
BUN: 17 mg/dL (ref 8–23)
CO2: 28 mmol/L (ref 22–32)
Calcium: 8.2 mg/dL — ABNORMAL LOW (ref 8.9–10.3)
Chloride: 103 mmol/L (ref 98–111)
Creatinine, Ser: 0.54 mg/dL (ref 0.44–1.00)
GFR calc Af Amer: >60 mL/min (ref >60)
GFR calc non Af Amer: >60 mL/min (ref >60)
GLUCOSE: 92 mg/dL (ref 70–99)
Potassium: 3.6 mmol/L (ref 3.5–5.1)
SODIUM: 139 mmol/L (ref 135–145)
TOTAL PROTEIN: 5.6 g/dL — AB (ref 6.5–8.1)
Total Bilirubin: 0.9 mg/dL (ref 0.3–1.2)

## 2018-04-14 MED ORDER — CEPHALEXIN 250 MG PO CAPS: 250.0000 mg | ORAL_CAPSULE | Freq: Two times a day (BID) | ORAL | 0 refills | Status: AC

## 2018-04-14 NOTE — Discharge Instructions (Signed)

## 2018-04-14 NOTE — Plan of Care (Signed)
  Problem: Education: Goal: Knowledge of General Education information will improve Description Including pain rating scale, medication(s)/side effects and non-pharmacologic comfort measures Outcome: Progressing   Problem: Health Behavior/Discharge Planning: Goal: Ability to manage health-related needs will improve Outcome: Progressing   

## 2018-04-14 NOTE — Discharge Summary (Signed)
[facility] NAME: [name]    MR#:  [id number]  DATE OF BIRTH:  06-28-1934  DATE OF ADMISSION:  04/12/2018   ADMITTING PHYSICIAN: Lance Coon, MD  DATE OF DISCHARGE: 04/14/2018  2:18 PM  PRIMARY CARE PHYSICIAN: Tonia Ghent, MD   ADMISSION DIAGNOSIS:  Lower urinary tract infection [N39.0] Elevated LFTs [R94.5] Altered mental status, unspecified altered mental status type [R41.82] AMS (altered mental status) [R41.82] Sepsis, due to unspecified organism, unspecified whether acute organ dysfunction present ([place]) [A41.9] DISCHARGE DIAGNOSIS:  Principal Problem:   Sepsis ([place]) Active Problems:   Essential hypertension   Malignant neoplasm of lower third of esophagus ([place])   UTI (urinary tract infection)   Transaminitis   Palliative care encounter   Abnormal LFTs (liver function tests)   Altered mental status  SECONDARY DIAGNOSIS:   Past Medical History:  Diagnosis Date  . Arthritis   . Asthma   . COPD (chronic obstructive pulmonary disease) ([place])   . Dysphonia   . Dyspnea   . Esophageal cancer ([place])   . GERD (gastroesophageal reflux disease)   . Heart murmur   . History of kidney stones   . Hypertension   . Hypokalemia 10/07/2017  . Melanoma ([place])   . OAB (overactive bladder)   . OSA on CPAP   . 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 Personal history of chemotherapy    esophageal cancer  . Personal history of radiation therapy    esophageal cancer  . Pupil asymmetry    R pupil defect  . Skin cancer   . Sleep apnea   . Spinal cord abscess    HOSPITAL COURSE:  Patient is a83 y.o.femalewith history of metastatic esophageal cancer, currently on second line chemotherapy, history of melanoma, heart murmur, COPD, OSA,admitted for altered mental status.  * Acute Metabolic Encephalopathy - due to sepsis. Resolved with treatment - CT head neg for acute patho  * Sepsis: present on  admission, due to UTI  * Klebsiella UTI: per urine culture, improving with Abx  *Leukopenia, likely chemotherapy induced  * Transaminitis/hyperbilirubinemia/dilated CBD - could be from mets normal MRCP abdomen  * Metastatic Esophageal cancer - f/by Onco Dr Tasia Catchings as an outpt. May need PET as scheduled  * Essential hypertension -stable on home dose antihypertensives DISCHARGE CONDITIONS:  stable CONSULTS OBTAINED:  Treatment Team:  Earlie Server, MD Lin Landsman, MD DRUG ALLERGIES:   Allergies  Allergen Reactions  . Cefuroxime Nausea Only  . Codeine Nausea Only  . Doxycycline Other (See Comments)    Lip swelling  . Gabapentin Other (See Comments)    Swelling.   . Iohexol Itching    07-15-13 pt developed itching on fingers after contrast given. Dr. Irish Elders looked at pt and said to put in system as allergy. BB  . Other Other (See Comments)    Contrast Dye- caused fever, chills, awful feeling Bandages - itching, rash  . Oxycodone Other (See Comments)    nausea  . Quinolones Swelling    Lip swelling  . Synvisc [Hylan G-F 20] Swelling   DISCHARGE MEDICATIONS:   Allergies as of 04/14/2018      Reactions   Cefuroxime Nausea Only   Codeine Nausea Only   Doxycycline Other (See Comments)   Lip swelling   Gabapentin Other (See Comments)   Swelling.    Iohexol Itching   07-15-13 pt developed itching on fingers after contrast  given. Dr. Irish Elders looked at pt and said to put in system as allergy. BB   Other Other (See Comments)   Contrast Dye- caused fever, chills, awful feeling Bandages - itching, rash   Oxycodone Other (See Comments)   nausea   Quinolones Swelling   Lip swelling   Synvisc [hylan G-f 20] Swelling      Medication List    STOP taking these medications   acetaminophen 325 MG tablet Commonly known as:  TYLENOL     TAKE these medications   albuterol 108 (90 Base) MCG/ACT inhaler Commonly known as:  PROVENTIL HFA;VENTOLIN HFA Inhale 2 puffs into the lungs  every 4 (four) hours as needed for wheezing or shortness of breath.   calcium carbonate 750 MG chewable tablet Commonly known as:  TUMS EX Chew 1 tablet by mouth as needed for heartburn.   CARAFATE 1 GM/10ML suspension Generic drug:  sucralfate TAKE 10MLS BY MOUTH FOUR TIMES DAILY What changed:  See the new instructions.   cephALEXin 250 MG capsule Commonly known as:  KEFLEX Take 1 capsule (250 mg total) by mouth 2 (two) times daily for 3 days.   chlorhexidine 0.12 % solution Commonly known as:  PERIDEX Use as directed 15 mLs in the mouth or throat 2 (two) times daily.   Diclofenac Sodium 1 % Crea Apply to left arm as needed for pain three times daily   dronabinol 5 MG capsule Commonly known as:  MARINOL Take 1 capsule (5 mg total) by mouth 2 (two) times daily before a meal.   fluticasone 50 MCG/ACT nasal spray Commonly known as:  FLONASE Place 1-2 sprays into both nostrils daily.   furosemide 20 MG tablet Commonly known as:  LASIX Take 1 tablet (20 mg total) by mouth daily.   hydrocortisone cream 1 % Apply 1 application topically daily as needed for itching.   loratadine 10 MG tablet Commonly known as:  CLARITIN Take 10 mg by mouth daily.   LYRICA 150 MG capsule Generic drug:  pregabalin TAKE 1 CAPSULE BY MOUTH TWO TIMES DAILY What changed:  See the new instructions.   metoprolol succinate 50 MG 24 hr tablet Commonly known as:  TOPROL-XL TAKE 1 TABLET BY MOUTH DAILY   mirabegron ER 25 MG Tb24 tablet Commonly known as:  MYRBETRIQ Take 1 tablet (25 mg total) by mouth daily.   MULTIVITAMIN ADULT Chew Chew 1 tablet by mouth daily.   pantoprazole 40 MG tablet Commonly known as:  PROTONIX TAKE ONE TABLET TWICE DAILY BEFORE A MEAL What changed:  See the new instructions.   potassium chloride 20 MEQ packet Commonly known as:  KLOR-CON Take 20 mEq by mouth daily.   pyridOXINE 100 MG tablet Commonly known as:  VITAMIN B-6 Take 1 tablet (100 mg total) by  mouth daily.   senna-docusate 8.6-50 MG tablet Commonly known as:  Senokot-S Take 1 tablet at bedtime as needed by mouth for mild constipation.   SYSTANE OP Apply 1 drop to eye daily as needed (dry eyes).   traMADol 50 MG tablet Commonly known as:  ULTRAM Take 1 tablet (50 mg total) by mouth every 6 (six) hours as needed.   traZODone 50 MG tablet Commonly known as:  DESYREL TAKE 1/2 TABLET BY MOUTH AT BEDTIME IF NEEDED FOR SLEEP What changed:  See the new instructions.   vitamin B-12 500 MCG tablet Commonly known as:  CYANOCOBALAMIN Take 500 mcg every other day by mouth.   Vitamin D 50 MCG (2000 UT)  Caps Take 2,000 Units by mouth daily.        DISCHARGE INSTRUCTIONS:   DIET:  Regular diet DISCHARGE CONDITION:  Good ACTIVITY:  Activity as tolerated OXYGEN:  Home Oxygen: No.  Oxygen Delivery: room air DISCHARGE LOCATION:  home with Home health, PT, RN, Palliative care to follow  If you experience worsening of your admission symptoms, develop shortness of breath, life threatening emergency, suicidal or homicidal thoughts you must seek medical attention immediately by calling 911 or calling your MD immediately  if symptoms less severe.  You Must read complete instructions/literature along with all the possible adverse reactions/side effects for all the Medicines you take and that have been prescribed to you. Take any new Medicines after you have completely understood and accpet all the possible adverse reactions/side effects.   Please note  You were cared for by a hospitalist during your hospital stay. If you have any questions about your discharge medications or the care you received while you were in the hospital after you are discharged, you can call the unit and asked to speak with the hospitalist on call if the hospitalist that took care of you is not available. Once you are discharged, your primary care physician will handle any further medical issues. Please note  that NO REFILLS for any discharge medications will be authorized once you are discharged, as it is imperative that you return to your primary care physician (or establish a relationship with a primary care physician if you do not have one) for your aftercare needs so that they can reassess your need for medications and monitor your lab values.    On the day of Discharge:  VITAL SIGNS:  Blood pressure (!) 152/73, pulse 80, temperature 99.3 F (37.4 C), temperature source Oral, resp. rate 18, height 5\' 2"  (1.575 m), weight 93 kg, SpO2 93 %. PHYSICAL EXAMINATION:  GENERAL:  83 y.o.-year-old patient lying in the bed with no acute distress.  EYES: Pupils equal, round, reactive to light and accommodation. No scleral icterus. Extraocular muscles intact.  HEENT: Head atraumatic, normocephalic. Oropharynx and nasopharynx clear.  NECK:  Supple, no jugular venous distention. No thyroid enlargement, no tenderness.  LUNGS: Normal breath sounds bilaterally, no wheezing, rales,rhonchi or crepitation. No use of accessory muscles of respiration.  CARDIOVASCULAR: S1, S2 normal. No murmurs, rubs, or gallops.  ABDOMEN: Soft, non-tender, non-distended. Bowel sounds present. No organomegaly or mass.  EXTREMITIES: No pedal edema, cyanosis, or clubbing.  NEUROLOGIC: Cranial nerves II through XII are intact. Muscle strength 5/5 in all extremities. Sensation intact. Gait not checked.  PSYCHIATRIC: The patient is alert and oriented x 3.  SKIN: No obvious rash, lesion, or ulcer.  DATA REVIEW:   CBC Recent Labs  Lab 04/14/18 0520  WBC 2.1*  HGB 10.9*  HCT 33.9*  PLT 132*    Chemistries  Recent Labs  Lab 04/14/18 0520  NA 139  K 3.6  CL 103  CO2 28  GLUCOSE 92  BUN 17  CREATININE 0.54  CALCIUM 8.2*  AST 135*  ALT 152*  ALKPHOS 132*  BILITOT 0.9     Microbiology Results  Results for orders placed or performed during the hospital encounter of 04/12/18  Urine Culture     Status: Abnormal  (Preliminary result)   Collection Time: 04/12/18  7:36 PM  Result Value Ref Range Status   Specimen Description   Final    URINE, CATHETERIZED Performed at [facility], [address], [place], [place] [address]  Special Requests   Final    NONE Performed at [facility], [address] [place], [place] [address]    Culture >=100,000 COLONIES/mL KLEBSIELLA PNEUMONIAE (A)  Final   Report Status PENDING  Incomplete  Culture, blood (Routine X 2) w Reflex to ID Panel     Status: None (Preliminary result)   Collection Time: 04/12/18 11:09 PM  Result Value Ref Range Status   Specimen Description BLOOD LEFT ANTECUBITAL  Final   Special Requests   Final    BOTTLES DRAWN AEROBIC AND ANAEROBIC Blood Culture adequate volume   Culture   Final    NO GROWTH 2 DAYS Performed at [facility], [address], [place], [place] [address]    Report Status PENDING  Incomplete  Culture, blood (Routine X 2) w Reflex to ID Panel     Status: None (Preliminary result)   Collection Time: 04/12/18 11:16 PM  Result Value Ref Range Status   Specimen Description BLOOD LEFT HAND  Final   Special Requests   Final    BOTTLES DRAWN AEROBIC AND ANAEROBIC Blood Culture adequate volume   Culture   Final    NO GROWTH 2 DAYS Performed at [facility], [address] [place],  [address]    Report Status PENDING  Incomplete    RADIOLOGY:  Mr 3d Recon At Scanner  Result Date: 04/14/2018 CLINICAL DATA:  Epigastric abdominal. History of esophageal carcinoma with liver metastasis, currently being treated with chemotherapy. EXAM: MRI ABDOMEN WITHOUT AND WITH CONTRAST (INCLUDING MRCP) TECHNIQUE: Multiplanar multisequence MR imaging of the abdomen was performed both before and after the administration of intravenous contrast. Heavily T2-weighted images of the biliary and pancreatic ducts were obtained, and three-dimensional MRCP images were rendered by post processing.  CONTRAST:  9 cc Gadavist COMPARISON:  Abdominal ultrasound 04/12/2018 and PET-CT 01/23/2018. FINDINGS: Lower chest: The lung bases are grossly clear. No obvious pulmonary lesions. No pleural or pericardial effusion. Hepatobiliary: Numerous hepatic lesions consistent with treated metastasis. Two of the larger lesions demonstrate mild rim-like enhancement. This includes a 2.4 cm lesion in segment 5 and a 2.4 cm lesion in segment 6. Mild intra and extrahepatic biliary dilatation likely due to prior cholecystectomy and the patient's age. No common bile duct stone or obstructing pancreatic or ampullary mass. Pancreas:  No mass, inflammation or ductal dilatation. Spleen:  Normal size.  No worrisome lesions. Adrenals/Urinary Tract:  The adrenal glands are unremarkable. Abnormal enhancement pattern of the left kidney suspicious for pyelonephritis. No renal abscess. Stomach/Bowel: The stomach, duodenum, visualized small bowel and visualized colon are unremarkable. Vascular/Lymphatic: The aorta and branch vessels are patent. The major venous structures are patent. No mesenteric or retroperitoneal mass or adenopathy. Other: Small amount of free fluid but no overt ascites. No obvious omental disease. Musculoskeletal: No significant bony findings. IMPRESSION: 1. Numerous small liver lesions consistent with treated metastasis. The 2 larger lesions demonstrate rim-like enhancement. 2. Mild intra and extrahepatic biliary dilatation likely due to the patient's age and prior cholecystectomy. No obstructing common bile duct stones, pancreatic mass or ampullary lesion. 3. Suspect pyelonephritis involving the left kidney. 4. No abdominal lymphadenopathy. 5. Small amount of free abdominal fluid but no overt ascites or obvious omental disease. Electronically Signed   By: Marijo Sanes M.D.   On: 04/14/2018 07:33   Mr Abdomen Mrcp Moise Boring Contast  Result Date: 04/14/2018 CLINICAL DATA:  Epigastric abdominal. History of esophageal  carcinoma with liver metastasis, currently being treated with chemotherapy. EXAM:  MRI ABDOMEN WITHOUT AND WITH CONTRAST (INCLUDING MRCP) TECHNIQUE: Multiplanar multisequence MR imaging of the abdomen was performed both before and after the administration of intravenous contrast. Heavily T2-weighted images of the biliary and pancreatic ducts were obtained, and three-dimensional MRCP images were rendered by post processing. CONTRAST:  9 cc Gadavist COMPARISON:  Abdominal ultrasound 04/12/2018 and PET-CT 01/23/2018. FINDINGS: Lower chest: The lung bases are grossly clear. No obvious pulmonary lesions. No pleural or pericardial effusion. Hepatobiliary: Numerous hepatic lesions consistent with treated metastasis. Two of the larger lesions demonstrate mild rim-like enhancement. This includes a 2.4 cm lesion in segment 5 and a 2.4 cm lesion in segment 6. Mild intra and extrahepatic biliary dilatation likely due to prior cholecystectomy and the patient's age. No common bile duct stone or obstructing pancreatic or ampullary mass. Pancreas:  No mass, inflammation or ductal dilatation. Spleen:  Normal size.  No worrisome lesions. Adrenals/Urinary Tract:  The adrenal glands are unremarkable. Abnormal enhancement pattern of the left kidney suspicious for pyelonephritis. No renal abscess. Stomach/Bowel: The stomach, duodenum, visualized small bowel and visualized colon are unremarkable. Vascular/Lymphatic: The aorta and branch vessels are patent. The major venous structures are patent. No mesenteric or retroperitoneal mass or adenopathy. Other: Small amount of free fluid but no overt ascites. No obvious omental disease. Musculoskeletal: No significant bony findings. IMPRESSION: 1. Numerous small liver lesions consistent with treated metastasis. The 2 larger lesions demonstrate rim-like enhancement. 2. Mild intra and extrahepatic biliary dilatation likely due to the patient's age and prior cholecystectomy. No obstructing common  bile duct stones, pancreatic mass or ampullary lesion. 3. Suspect pyelonephritis involving the left kidney. 4. No abdominal lymphadenopathy. 5. Small amount of free abdominal fluid but no overt ascites or obvious omental disease. Electronically Signed   By: Marijo Sanes M.D.   On: 04/14/2018 07:33     Management plans discussed with the patient, family and they are in agreement.  CODE STATUS: DNR   TOTAL TIME TAKING CARE OF THIS PATIENT: 45 minutes.    Max Sane M.D on 04/14/2018 at 4:01 PM  Between 7am to 6pm - Pager - [phone]  After 6pm go to [email or web address] - password [email or web address] [facility]  [facility]  Office  [phone]  CC: Primary care physician; Tonia Ghent, MD   Note: This dictation was prepared with Dragon dictation along with smaller phrase technology. Any transcriptional errors that result from this process are unintentional.

## 2018-04-14 NOTE — Care Management Note (Signed)
Case Management Note  Patient Details  Name: [name] MRN: [id number] Date of Birth: [birth year]  Subjective/Objective: Admitted to [facility] with the diagnosis of sepsis. Lives alone. Seen Dr. Damita Dunnings several weeks ago. Prescrriptions are filled at [facility], Son is Yvone Neu [phone]).  Takes care of all basic activities of daily living herself, drives. [place] in the past, [place] in the past. No home oxygen. 3-1  Shower chair, rolling walker, and transport chair in the home. Last fall was in the garden in August 2019.Good appetite. Family will transport..                    Action/Plan: Registered nurse, physical therapy and occupational therapy ordered. Discussed home health agencies, [place]. Floydene Flock,. [facility] representative updated. Can accept this case   Expected Discharge Date:  04/14/18               Expected Discharge Plan:     In-House Referral:   yes  Discharge planning Services   yes  Post Acute Care Choice:   yes Choice offered to:   patient and son.  DME Arranged:    DME Agency:     HH Arranged:   yes HH Agency:     Status of Service:   [place]  If discussed at [place] of Stay Meetings, dates discussed:    Additional Comments:  Shelbie Ammons, RN MSN CCM Care Management [phone] 04/14/2018, 9:17 AM

## 2018-04-14 NOTE — Progress Notes (Signed)
Speech Language Pathology Treatment: Dysphagia  Patient Details Name: [name] MRN: [id number] DOB: 1934/09/24 Today's Date: 04/14/2018 Time: 0850-0930 SLP Time Calculation (min) (ACUTE ONLY): 40 min  Assessment / Plan / Recommendation Clinical Impression  Pt seen today for assessment of toleration of diet - she was started on the oral diet yesterday post tests per MD order w/ recommendation for cut meats, soft foods for easy mastication/eating. Son was present in room initially then left for work. He stated he felt she was "back to about 90%" Mental Status wise. Noted pt to laugh/chuckly often and be easily distracted - unsure of her baseline Cognitive status as she lives alone. Pt has dx of metastatic esophageal cancer but denies difficulty swallowing or clearing foods Esophageally.  Pt consumed po trials of thin liquids, puree and softened foods at breakfast meal w/ no overt s/s of aspiration noted or reported by pt/Son. Pt consumed all items on the tray feeding self w/ setup assistance given. Oral phase appeared wfl. No reports of Esophageal dysmotility or discomfort w/ the meal items this morning. Education on general aspiration precautions; foods that are easier to masticate/eat to conserve energy; foods of interest including flavored waters or lemonade to encourage hydration. This information was given briefly to Son before he left. Handouts left in room on general aspiration precautions. No further skilled ST services indicated at this time as she appears at her baseline for swallowing. Recommend consideration of f/u w/ Neurologist for a formal Cognitive assessment as pt lives alone; safety w/ ADLs in the home. NSG and Care Manager updated.      HPI HPI: Pt is a 83 y.o. female w/ multiple PMH who presents with chief complaint of AMS.  Patient is confused and unable to contribute to her HPI.  Family at bedside states that they found her at home in her bed in the afternoon in this  state.  They tried to call her when she did not respond they went to her house.  She is normally active and her activities of daily living.  Son at bedside states that yesterday she also did not do as much as she normally does and was probably not feeling well.  She is also complained of some epigastric abdominal discomfort transiently, couple of times over the past week.  Here in the ED tonight, she is found to have UTI, met sepsis criteria.  She does have a history of esophageal adenocarcinoma with liver involvement, currently being treated on chemotherapy and followed by Oncology.  She is also found to have significant transaminitis in the ED compared to normal values about 6 to 7 days ago.  Abdominal ultrasound shows dilated common bile duct, patient is status post cholecystectomy.  Son denied pt having any Esophageal dysmotility deficits when eating foods/solids or liquids. He stated she ate a Regular diet "like normal" at home though she has lost weight over past few months. Pt is followed by Oncology for Esophageal CA.       SLP Plan  All goals met       Recommendations  Diet recommendations: Dysphagia 3 (mechanical soft);Thin liquid Liquids provided via: Cup;Straw Medication Administration: Whole meds with puree(as needed for safer swallowing) Supervision: Patient able to self feed;Intermittent supervision to cue for compensatory strategies((for set up and positioning upright)) Compensations: Minimize environmental distractions;Slow rate;Small sips/bites;Lingual sweep for clearance of pocketing;Follow solids with liquid Postural Changes and/or Swallow Maneuvers: Seated upright 90 degrees;Upright 30-60 min after meal  General recommendations: (Dietician f/u as needed) Oral Care Recommendations: Oral care BID;Staff/trained caregiver to provide oral care Follow up Recommendations: None SLP Visit Diagnosis: Dysphagia, unspecified (R13.10) Plan: All goals met        GO                Orinda Kenner, MS, CCC-SLP Watson,Katherine 04/14/2018, 10:16 AM

## 2018-04-14 NOTE — Progress Notes (Signed)
Pt discharged at this time via w/c. No c/o or distress. Instructions discussed with pt and sister. meds diet activity and f/u discussed. Verbalized understanding.sl d/cd.

## 2018-04-14 NOTE — Progress Notes (Signed)
Hematology/Oncology Progress Note [facility] Telephone:([phone] [phone]  Patient Care Team: Tonia Ghent, MD as PCP - General (Family Medicine)   Name of the patient: [name]  [id number]  April 16, 1935  Date of visit: 04/19/18   INTERVAL HISTORY-  No acute overnight events. Mental status improved. Sitting in the chair. Feels better. Sister at bedside.     Review of systems- Review of Systems  Constitutional: Positive for fatigue. Negative for appetite change, chills and fever.  HENT:   Negative for hearing loss and voice change.   Eyes: Negative for eye problems.  Respiratory: Negative for chest tightness and cough.   Cardiovascular: Negative for chest pain.  Gastrointestinal: Negative for abdominal distention, abdominal pain and blood in stool.  Endocrine: Negative for hot flashes.  Genitourinary: Negative for difficulty urinating and frequency.   Musculoskeletal: Negative for arthralgias.  Skin: Negative for itching and rash.  Neurological: Negative for extremity weakness.  Hematological: Negative for adenopathy.  Psychiatric/Behavioral: Negative for confusion.    Allergies  Allergen Reactions  . Cefuroxime Nausea Only  . Codeine Nausea Only  . Doxycycline Other (See Comments)    Lip swelling  . Gabapentin Other (See Comments)    Swelling.   . Iohexol Itching    07-15-13 pt developed itching on fingers after contrast given. Dr. Irish Elders looked at pt and said to put in system as allergy. BB  . Other Other (See Comments)    Contrast Dye- caused fever, chills, awful feeling Bandages - itching, rash  . Oxycodone Other (See Comments)    nausea  . Quinolones Swelling    Lip swelling  . Synvisc [[place] G-F 20] Swelling    Patient Active Problem List   Diagnosis Date Noted  . Palliative care encounter   . Abnormal LFTs (liver function tests)   . Altered mental status   . Sepsis ([place]) 04/12/2018  . UTI (urinary tract  infection) 04/12/2018  . Transaminitis 04/12/2018  . Acute sinusitis 03/05/2018  . Urinary incontinence 03/05/2018  . Glycogenic acanthosis of the esophagus   . Chronic peptic ulcer of stomach   . Problems with swallowing and mastication   . Other foreign object in esophagus causing other injury, initial encounter   . Chronic gastric ulcer without hemorrhage and without perforation   . Anastomotic ulcer   . Hypokalemia 10/07/2017  . Thickening of esophagus   . Other specified diseases of the digestive system   . Stricture and stenosis of esophagus   . Radiation esophagitis   . Adverse effect of radiation   . Esophageal adenocarcinoma ([place])   . Coronary artery calcification seen on CT scan 08/14/2017  . Aortic atherosclerosis ([place]) 08/14/2017  . Aortic stenosis 07/07/2017  . Goals of care, counseling/discussion 05/13/2017  . Encounter for antineoplastic chemotherapy 05/12/2017  . Tenosynovitis, nodular 05/08/2017  . Malignant neoplasm of lower third of esophagus ([place]) 03/29/2017  . Liver lesion   . Iron deficiency anemia   . Dysphagia   . Loss of weight   . Abdominal pain 03/20/2017  . Insomnia 12/19/2016  . Advance care planning 12/19/2016  . Hoarseness 07/11/2015  . Tinnitus of both ears 07/11/2015  . Vocal cord paralysis, unilateral complete 07/11/2015  . Knee joint replaced by other means 03/26/2015  . Paresthesia 08/26/2014  . Asthma without status asthmaticus 12/03/2013  . Borderline diabetes 12/03/2013  . Essential hypertension 12/03/2013  . OA (osteoarthritis) 12/03/2013  . Morbid obesity due to excess calories ([place]) 12/03/2013  .  Overactive bladder 12/03/2013  . RLS (restless legs syndrome) 12/03/2013  . Sleep apnea 12/03/2013  . C4 spinal cord injury ([place]) 12/03/2013  . Dysphonia 11/24/2013  . Neuropathy 10/22/2013  . Polyneuropathy associated with critical illness ([place]) 10/22/2013  . 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 OSA on CPAP 07/16/2013     Past  Medical History:  Diagnosis Date  . Arthritis   . Asthma   . COPD (chronic obstructive pulmonary disease) ([place])   . Dysphonia   . Dyspnea   . Esophageal cancer ([place])   . GERD (gastroesophageal reflux disease)   . Heart murmur   . History of kidney stones   . Hypertension   . Hypokalemia 10/07/2017  . Melanoma ([place])   . OAB (overactive bladder)   . OSA on CPAP   . 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 Personal history of chemotherapy    esophageal cancer  . Personal history of radiation therapy    esophageal cancer  . Pupil asymmetry    R pupil defect  . Skin cancer   . Sleep apnea   . Spinal cord abscess      Past Surgical History:  Procedure Laterality Date  . ABDOMINAL HYSTERECTOMY    . ANTERIOR CERVICAL DECOMP/DISCECTOMY FUSION N/A 07/16/2013   Procedure: ANTERIOR CERVICAL DECOMPRESSION/DISCECTOMY FUSION mutlipleLEVELS C4-7;  Surgeon: Erline Levine, MD;  Location: [facility];  Service: Neurosurgery;  Laterality: N/A;  . APPENDECTOMY    . carpel tunn Bilateral   . CATARACT EXTRACTION    . CATARACT EXTRACTION, BILATERAL    . CHOLECYSTECTOMY    . dental implant    . ESOPHAGOGASTRODUODENOSCOPY Left 03/21/2017   Procedure: ESOPHAGOGASTRODUODENOSCOPY (EGD);  Surgeon: Virgel Manifold, MD;  Location: [facility] ENDOSCOPY;  Service: Endoscopy;  Laterality: Left;  . ESOPHAGOGASTRODUODENOSCOPY (EGD) WITH PROPOFOL N/A 08/20/2017   Procedure: ESOPHAGOGASTRODUODENOSCOPY (EGD) WITH PROPOFOL;  Surgeon: Virgel Manifold, MD;  Location: [facility] ENDOSCOPY;  Service: Endoscopy;  Laterality: N/A;  . ESOPHAGOGASTRODUODENOSCOPY (EGD) WITH PROPOFOL N/A 09/19/2017   Procedure: ESOPHAGOGASTRODUODENOSCOPY (EGD) WITH PROPOFOL;  Surgeon: Virgel Manifold, MD;  Location: [facility] ENDOSCOPY;  Service: Endoscopy;  Laterality: N/A;  . ESOPHAGOGASTRODUODENOSCOPY (EGD) WITH PROPOFOL N/A 10/03/2017   Procedure: ESOPHAGOGASTRODUODENOSCOPY (EGD) WITH PROPOFOL;  Surgeon: Virgel Manifold,  MD;  Location: [facility] ENDOSCOPY;  Service: Endoscopy;  Laterality: N/A;  . ESOPHAGOGASTRODUODENOSCOPY (EGD) WITH PROPOFOL N/A 10/22/2017   Procedure: ESOPHAGOGASTRODUODENOSCOPY (EGD) WITH PROPOFOL;  Surgeon: Virgel Manifold, MD;  Location: [facility] ENDOSCOPY;  Service: Endoscopy;  Laterality: N/A;  . ESOPHAGOGASTRODUODENOSCOPY (EGD) WITH PROPOFOL N/A 11/18/2017   Procedure: ESOPHAGOGASTRODUODENOSCOPY (EGD) WITH PROPOFOL;  Surgeon: Virgel Manifold, MD;  Location: [facility] ENDOSCOPY;  Service: Endoscopy;  Laterality: N/A;  . ESOPHAGOGASTRODUODENOSCOPY (EGD) WITH PROPOFOL N/A 12/17/2017   Procedure: ESOPHAGOGASTRODUODENOSCOPY (EGD) WITH PROPOFOL;  Surgeon: Virgel Manifold, MD;  Location: [facility] ENDOSCOPY;  Service: Endoscopy;  Laterality: N/A;  . ESOPHAGOGASTRODUODENOSCOPY (EGD) WITH PROPOFOL N/A 01/08/2018   Procedure: ESOPHAGOGASTRODUODENOSCOPY (EGD) WITH PROPOFOL;  Surgeon: Virgel Manifold, MD;  Location: [facility] ENDOSCOPY;  Service: Endoscopy;  Laterality: N/A;  . ESOPHAGOGASTRODUODENOSCOPY (EGD) WITH PROPOFOL N/A 01/22/2018   Procedure: ESOPHAGOGASTRODUODENOSCOPY (EGD) WITH PROPOFOL;  Surgeon: Virgel Manifold, MD;  Location: [facility] ENDOSCOPY;  Service: Endoscopy;  Laterality: N/A;  . EYE SURGERY    . GASTRIC BYPASS    . HERNIA REPAIR    . HIATAL HERNIA REPAIR    . JOINT REPLACEMENT    . MELANOMA EXCISION    . PORTA CATH  INSERTION N/A 04/07/2017   Procedure: PORTA CATH INSERTION;  Surgeon: Algernon Huxley, MD;  Location: [facility] CV LAB;  Service: Cardiovascular;  Laterality: N/A;  . POSTERIOR CERVICAL FUSION/FORAMINOTOMY N/A 07/28/2013   Procedure: Cervical four-seven  posterior cervical fusion;  Surgeon: Erline Levine, MD;  Location: [facility]URO [facility]S;  Service: Neurosurgery;  Laterality: N/A;  . TONSILLECTOMY    . TOTAL KNEE ARTHROPLASTY      Social History   Socioeconomic History  . Marital status: Widowed    Spouse name: Not on file  . Number of children: 2  . Years of education: Not  on file  . Highest education level: Master's degree (e.g., MA, MS, MEng, MEd, MSW, MBA)  Occupational History  . Not on file  Social Needs  . Financial resource strain: Not hard at all  . Food insecurity:    Worry: Never true    Inability: Never true  . Transportation needs:    Medical: No    Non-medical: No  Tobacco Use  . Smoking status: Former Smoker    Packs/day: 1.00    Years: 20.00    Pack years: 20.00    Types: Cigarettes    Last attempt to quit: 1977    Years since quitting: 42.9  . Smokeless tobacco: Never Used  Substance and Sexual Activity  . Alcohol use: No  . Drug use: Never  . Sexual activity: Not Currently  Lifestyle  . Physical activity:    Days per week: 2 days    Minutes per session: 30 min  . Stress: Not at all  Relationships  . Social connections:    Talks on phone: More than three times a week    Gets together: More than three times a week    Attends religious service: More than 4 times per year    Active member of club or organization: Yes    Attends meetings of clubs or organizations: More than 4 times per year    Relationship status: Widowed  . Intimate partner violence:    Fear of current or ex partner: No    Emotionally abused: No    Physically abused: No    Forced sexual activity: No  Other Topics Concern  . Not on file  Social History Narrative   Marital Status- Widowed    Lives by herself    Employement- Retired Pharmacist, hospital   Exercise hx- Does PT      Family History  Problem Relation Age of Onset  . Breast cancer Mother 76  . Arthritis-Osteo Mother   . Breast cancer Sister 48  . Bladder Cancer Sister   . Bladder Cancer Father   . Tongue cancer Father   . Congenital heart disease Father   . Prostate cancer Brother   . Uterine cancer Maternal Aunt   . Lung cancer Paternal Uncle   . Leukemia Maternal Grandmother   . Liver cancer Paternal Grandmother   . Colon cancer Neg Hx     No current facility-administered medications for  this encounter.   Current Outpatient Medications:  .  albuterol (PROVENTIL HFA;VENTOLIN HFA) 108 (90 Base) MCG/ACT inhaler, Inhale 2 puffs into the lungs every 4 (four) hours as needed for wheezing or shortness of breath., Disp: , Rfl:  .  calcium carbonate (TUMS EX) 750 MG chewable tablet, Chew 1 tablet by mouth as needed for heartburn. , Disp: , Rfl:  .  CARAFATE 1 GM/10ML suspension, TAKE 10MLS BY MOUTH FOUR TIMES DAILY (Patient taking differently: Take  1 g by mouth 4 (four) times daily. ), Disp: 420 mL, Rfl: 1 .  chlorhexidine (PERIDEX) 0.12 % solution, Use as directed 15 mLs in the mouth or throat 2 (two) times daily., Disp: , Rfl:  .  Cholecalciferol (VITAMIN D) 2000 UNITS CAPS, Take 2,000 Units by mouth daily., Disp: , Rfl:  .  cyanocobalamin 500 MCG tablet, Take 500 mcg every other day by mouth., Disp: , Rfl:  .  Diclofenac Sodium 1 % CREA, Apply to left arm as needed for pain three times daily, Disp: 120 g, Rfl: 0 .  dronabinol (MARINOL) 5 MG capsule, Take 1 capsule (5 mg total) by mouth 2 (two) times daily before a meal., Disp: 60 capsule, Rfl: 0 .  fluticasone (FLONASE) 50 MCG/ACT nasal spray, Place 1-2 sprays into both nostrils daily., Disp: 16 g, Rfl: 5 .  furosemide (LASIX) 20 MG tablet, Take 1 tablet (20 mg total) by mouth daily., Disp: 90 tablet, Rfl: 3 .  hydrocortisone cream 1 %, Apply 1 application topically daily as needed for itching. , Disp: , Rfl:  .  loratadine (CLARITIN) 10 MG tablet, Take 10 mg by mouth daily., Disp: , Rfl:  .  LYRICA 150 MG capsule, TAKE 1 CAPSULE BY MOUTH TWO TIMES DAILY (Patient taking differently: Take 150 mg by mouth 2 (two) times daily. ), Disp: 180 capsule, Rfl: 1 .  metoprolol succinate (TOPROL-XL) 50 MG 24 hr tablet, TAKE 1 TABLET BY MOUTH DAILY, Disp: 90 tablet, Rfl: 1 .  mirabegron ER (MYRBETRIQ) 25 MG TB24 tablet, Take 1 tablet (25 mg total) by mouth daily., Disp: 30 tablet, Rfl: 2 .  Multiple Vitamins-Minerals (MULTIVITAMIN ADULT) CHEW, Chew 1  tablet by mouth daily. , Disp: , Rfl:  .  pantoprazole (PROTONIX) 40 MG tablet, TAKE ONE TABLET TWICE DAILY BEFORE A MEAL (Patient taking differently: Take 40 mg by mouth 2 (two) times daily before a meal. ), Disp: 60 tablet, Rfl: 1 .  Polyethyl Glycol-Propyl Glycol (SYSTANE OP), Apply 1 drop to eye daily as needed (dry eyes)., Disp: , Rfl:  .  potassium chloride (KLOR-CON) 20 MEQ packet, Take 20 mEq by mouth daily., Disp: 30 packet, Rfl: 1 .  pyridOXINE (VITAMIN B-6) 100 MG tablet, Take 1 tablet (100 mg total) by mouth daily., Disp: 30 tablet, Rfl: 3 .  senna-docusate (SENOKOT-S) 8.6-50 MG tablet, Take 1 tablet at bedtime as needed by mouth for mild constipation., Disp: , Rfl:  .  traMADol (ULTRAM) 50 MG tablet, Take 1 tablet (50 mg total) by mouth every 6 (six) hours as needed., Disp: 30 tablet, Rfl: 0 .  traZODone (DESYREL) 50 MG tablet, TAKE 1/2 TABLET BY MOUTH AT BEDTIME IF NEEDED FOR SLEEP (Patient taking differently: Take 25 mg by mouth at bedtime as needed for sleep. ), Disp: 45 tablet, Rfl: 3  Facility-Administered Medications Ordered in Other Encounters:  .  sodium chloride flush (NS) 0.9 % injection 10 mL, 10 mL, Intravenous, PRN, Earlie Server, MD, 10 mL at 01/26/18 0904   Physical exam:  Vitals:   04/13/18 0824 04/13/18 1932 04/14/18 0419 04/14/18 0849  BP: (!) 168/84 127/64 (!) 110/59 (!) 152/73  Pulse: 93 76 78 80  Resp: 20 20 20 18   Temp: (!) 101.8 F (38.8 C) 98.7 F (37.1 C) 99 F (37.2 C) 99.3 F (37.4 C)  TempSrc: Oral Oral Oral Oral  SpO2: 97% 98% 96% 93%  Weight:      Height:       Physical Exam  Constitutional:  She is oriented to person, place, and time. No distress.  HENT:  Head: Normocephalic and atraumatic.  Nose: Nose normal.  Mouth/Throat: Oropharynx is clear and moist. No oropharyngeal exudate.  Eyes: Pupils are equal, round, and reactive to light. EOM are normal. No scleral icterus.  Neck: Normal range of motion. Neck supple.  Cardiovascular: Normal rate  and regular rhythm.  Murmur heard. Pulmonary/Chest: Effort normal. No respiratory distress. She has no rales. She exhibits no tenderness.  Abdominal: Soft. She exhibits no distension. There is no tenderness.  Musculoskeletal: Normal range of motion. She exhibits no edema.  Neurological: She is alert and oriented to person, place, and time. No cranial nerve deficit.  Skin: Skin is warm and dry. She is not diaphoretic. No erythema.  Psychiatric: Affect normal.       CMP Latest Ref Rng & Units 04/14/2018  Glucose 70 - 99 mg/dL 92  BUN 8 - 23 mg/dL 17  Creatinine 0.44 - 1.00 mg/dL 0.54  Sodium 135 - 145 mmol/L 139  Potassium 3.5 - 5.1 mmol/L 3.6  Chloride 98 - 111 mmol/L 103  CO2 22 - 32 mmol/L 28  Calcium 8.9 - 10.3 mg/dL 8.2(L)  Total Protein 6.5 - 8.1 g/dL 5.6(L)  Total Bilirubin 0.3 - 1.2 mg/dL 0.9  Alkaline Phos 38 - 126 U/L 132(H)  AST 15 - 41 U/L 135(H)  ALT 0 - 44 U/L 152(H)   CBC Latest Ref Rng & Units 04/14/2018  WBC 4.0 - 10.5 K/uL 2.1(L)  Hemoglobin 12.0 - 15.0 g/dL 10.9(L)  Hematocrit 36.0 - 46.0 % 33.9(L)  Platelets 150 - 400 K/uL 132(L)   RADIOGRAPHIC STUDIES: I have personally reviewed the radiological images as listed and agreed with the findings in the report. Dg Chest 1 View  Result Date: 04/12/2018 CLINICAL DATA:  Acute mental status change EXAM: CHEST  1 VIEW COMPARISON:  March 20, 2017 FINDINGS: A right Port-A-Cath terminates in the central SVC. No pneumothorax. Cardiomegaly. Atelectasis or scar in the left base. No pulmonary nodules, masses, or suspicious infiltrate. IMPRESSION: No active disease. Electronically Signed   By: Dorise Bullion III M.D   On: 04/12/2018 19:55   Ct Head Wo Contrast  Result Date: 04/13/2018 CLINICAL DATA:  83 y/o F; history of metastatic esophageal cancer. Patient presents with altered mental status. EXAM: CT HEAD WITHOUT CONTRAST TECHNIQUE: Contiguous axial images were obtained from the base of the skull through the vertex  without intravenous contrast. COMPARISON:  01/23/2018 PET-CT. FINDINGS: Brain: No evidence of acute infarction, hemorrhage, hydrocephalus, extra-axial collection or mass lesion/mass effect. Few nonspecific foci of hypodensity within white matter likely represent mild chronic microvascular ischemic changes and there is mild volume loss of the brain. Vascular: Calcific atherosclerosis of the carotid siphons. No hyperdense vessel identified. Skull: Normal. Negative for fracture or focal lesion. Sinuses/Orbits: Small fluid level within the left sphenoid sinus. Paranasal sinuses and mastoid air cells are otherwise normally aerated. Bilateral intra-ocular lens replacement. Other: None. IMPRESSION: 1. No acute intracranial abnormality identified. 2. Mild chronic microvascular ischemic changes and mild volume loss of the brain. 3. Small fluid level within left sphenoid sinus may represent acute sinusitis in the appropriate clinical setting. 4. No gross mass effect to indicate metastatic disease. Small metastasis may not be visible with noncontrast CT. MRI of the brain with and without contrast is the study of choice to evaluate for intracranial metastatic disease. Electronically Signed   By: Kristine Garbe M.D.   On: 04/13/2018 14:27   Mr 3d Recon  At Scanner  Result Date: 04/14/2018 CLINICAL DATA:  Epigastric abdominal. History of esophageal carcinoma with liver metastasis, currently being treated with chemotherapy. EXAM: MRI ABDOMEN WITHOUT AND WITH CONTRAST (INCLUDING MRCP) TECHNIQUE: Multiplanar multisequence MR imaging of the abdomen was performed both before and after the administration of intravenous contrast. Heavily T2-weighted images of the biliary and pancreatic ducts were obtained, and three-dimensional MRCP images were rendered by post processing. CONTRAST:  9 cc Gadavist COMPARISON:  Abdominal ultrasound 04/12/2018 and PET-CT 01/23/2018. FINDINGS: Lower chest: The lung bases are grossly clear. No  obvious pulmonary lesions. No pleural or pericardial effusion. Hepatobiliary: Numerous hepatic lesions consistent with treated metastasis. Two of the larger lesions demonstrate mild rim-like enhancement. This includes a 2.4 cm lesion in segment 5 and a 2.4 cm lesion in segment 6. Mild intra and extrahepatic biliary dilatation likely due to prior cholecystectomy and the patient's age. No common bile duct stone or obstructing pancreatic or ampullary mass. Pancreas:  No mass, inflammation or ductal dilatation. Spleen:  Normal size.  No worrisome lesions. Adrenals/Urinary Tract:  The adrenal glands are unremarkable. Abnormal enhancement pattern of the left kidney suspicious for pyelonephritis. No renal abscess. Stomach/Bowel: The stomach, duodenum, visualized small bowel and visualized colon are unremarkable. Vascular/Lymphatic: The aorta and branch vessels are patent. The major venous structures are patent. No mesenteric or retroperitoneal mass or adenopathy. Other: Small amount of free fluid but no overt ascites. No obvious omental disease. Musculoskeletal: No significant bony findings. IMPRESSION: 1. Numerous small liver lesions consistent with treated metastasis. The 2 larger lesions demonstrate rim-like enhancement. 2. Mild intra and extrahepatic biliary dilatation likely due to the patient's age and prior cholecystectomy. No obstructing common bile duct stones, pancreatic mass or ampullary lesion. 3. Suspect pyelonephritis involving the left kidney. 4. No abdominal lymphadenopathy. 5. Small amount of free abdominal fluid but no overt ascites or obvious omental disease. Electronically Signed   By: Marijo Sanes M.D.   On: 04/14/2018 07:33   Mr Abdomen Mrcp Moise Boring Contast  Result Date: 04/14/2018 CLINICAL DATA:  Epigastric abdominal. History of esophageal carcinoma with liver metastasis, currently being treated with chemotherapy. EXAM: MRI ABDOMEN WITHOUT AND WITH CONTRAST (INCLUDING MRCP) TECHNIQUE: Multiplanar  multisequence MR imaging of the abdomen was performed both before and after the administration of intravenous contrast. Heavily T2-weighted images of the biliary and pancreatic ducts were obtained, and three-dimensional MRCP images were rendered by post processing. CONTRAST:  9 cc Gadavist COMPARISON:  Abdominal ultrasound 04/12/2018 and PET-CT 01/23/2018. FINDINGS: Lower chest: The lung bases are grossly clear. No obvious pulmonary lesions. No pleural or pericardial effusion. Hepatobiliary: Numerous hepatic lesions consistent with treated metastasis. Two of the larger lesions demonstrate mild rim-like enhancement. This includes a 2.4 cm lesion in segment 5 and a 2.4 cm lesion in segment 6. Mild intra and extrahepatic biliary dilatation likely due to prior cholecystectomy and the patient's age. No common bile duct stone or obstructing pancreatic or ampullary mass. Pancreas:  No mass, inflammation or ductal dilatation. Spleen:  Normal size.  No worrisome lesions. Adrenals/Urinary Tract:  The adrenal glands are unremarkable. Abnormal enhancement pattern of the left kidney suspicious for pyelonephritis. No renal abscess. Stomach/Bowel: The stomach, duodenum, visualized small bowel and visualized colon are unremarkable. Vascular/Lymphatic: The aorta and branch vessels are patent. The major venous structures are patent. No mesenteric or retroperitoneal mass or adenopathy. Other: Small amount of free fluid but no overt ascites. No obvious omental disease. Musculoskeletal: No significant bony findings. IMPRESSION: 1. Numerous small liver lesions  consistent with treated metastasis. The 2 larger lesions demonstrate rim-like enhancement. 2. Mild intra and extrahepatic biliary dilatation likely due to the patient's age and prior cholecystectomy. No obstructing common bile duct stones, pancreatic mass or ampullary lesion. 3. Suspect pyelonephritis involving the left kidney. 4. No abdominal lymphadenopathy. 5. Small amount of  free abdominal fluid but no overt ascites or obvious omental disease. Electronically Signed   By: Marijo Sanes M.D.   On: 04/14/2018 07:33   US Abdomen Limited Ruq  Result Date: 04/12/2018 CLINICAL DATA:  Elevated LFTs EXAM: ULTRASOUND ABDOMEN LIMITED RIGHT UPPER QUADRANT COMPARISON:  CT 01/23/2018 FINDINGS: Gallbladder: Prior cholecystectomy Common bile duct: Diameter: Dilated, 10 mm. The distal duct cannot be visualized due to overlying bowel gas. Liver: Intrahepatic biliary ductal dilatation. There is a 2 cm cyst in the right lobe of the liver. Increased heterogeneous echotexture throughout the liver suggesting fatty infiltration. Possible solid lesion in the right hepatic lobe measuring up to 2.4 cm which is isoechoic. It is difficult to definitively confirm given its isoechoic appearance. Portal vein is patent on color Doppler imaging with normal direction of blood flow towards the liver. IMPRESSION: Dilated common bile duct, measuring 10 mm. This may be related to post cholecystectomy state. If there is a concern for biliary obstruction, MRCP or ERCP may be beneficial. Heterogeneous, increased echotexture throughout the liver suggesting fatty infiltration or intrinsic liver disease. Possible isoechoic solid lesion in the right hepatic lobe measuring 2.4 cm versus an area of heterogeneity. This could be further evaluated with contrast-enhanced CT or MRI to exclude focal hepatic lesion. Electronically Signed   By: Rolm Baptise M.D.   On: 04/12/2018 22:05    Assessment and plan-  Patient is a 83 y.o. female with history of metastatic esophageal cancer, currently on second line chemotherapy, history of melanoma, heart murmur, COPD, OSA, or sent to emergency room due to altered mental status.  #Altered mental status, CT head negative for acute process. Mental status improved after UTI being treated.  # Transaminitis trending down.  # Metastatic Esophageal cancer, follow up outpatient   Thank you  for allowing me to participate in the care of this patient.   Earlie Server, MD, PhD Hematology Oncology [facility] Pager- [phone] 04/19/2018

## 2018-04-15 ENCOUNTER — Telehealth: Payer: Self-pay

## 2018-04-15 LAB — HEPATITIS PANEL, ACUTE
HCV Ab: <0.1 s/co ratio (ref 0.0–0.9)
Hep A IgM: NEGATIVE
Hep B C IgM: NEGATIVE
Hepatitis B Surface Ag: NEGATIVE

## 2018-04-15 LAB — HSV 1 AND 2 IGM ABS, INDIRECT
HSV 1 IgM Antibodies: <1:10 {titer}
HSV 2 IgM Antibodies: <1:10 {titer}

## 2018-04-15 LAB — URINE CULTURE: Culture: >=100000 — AB

## 2018-04-15 NOTE — Telephone Encounter (Signed)
Transition Care Management Follow-up Telephone Call   Date discharged? 04/14/2018   How have you been since you were released from the hospital? Doing better. Does have some issue with dizzy feeling. Using cane for support.   Do you understand why you were in the hospital? Yes.   Do you understand the discharge instructions? Yes   Where were you discharged to? Home   Items Reviewed:  Medications reviewed: Yes  Allergies reviewed: Yes  Dietary changes reviewed: Yes  Referrals reviewed: Yes-none were made except to see oncology for routine visit on 04/20/18   Functional Questionnaire:   Activities of Daily Living (ADLs):   She states they are independent in the following: feeding, dressing, hygiene, toileting. States they require assistance with the following: ambulation, uses bars in the shower to bath, some incontinence.   Any transportation issues/concerns?: No   Any patient concerns? Not at this time.   Confirmed importance and date/time of follow-up visits scheduled Yes  Provider Appointment booked with Dr Damita Dunnings 04/17/2018 at 2 pm.  Confirmed with patient if condition begins to worsen call PCP or go to the ER.  Patient was given the office number and encouraged to call back with question or concerns.  : Yes

## 2018-04-16 NOTE — Telephone Encounter (Signed)
Noted. Thanks.

## 2018-04-17 ENCOUNTER — Telehealth: Payer: Self-pay | Admitting: Family Medicine

## 2018-04-17 ENCOUNTER — Ambulatory Visit: Payer: Medicare Other | Admitting: Family Medicine

## 2018-04-17 LAB — CULTURE, BLOOD (ROUTINE X 2)
Culture: NO GROWTH
Culture: NO GROWTH
Special Requests: ADEQUATE
Special Requests: ADEQUATE

## 2018-04-17 NOTE — Telephone Encounter (Signed)
Stacey/[facility] called requesting verbal orders for PT for 2 times a week for 2 weeks. Best cb [phone]

## 2018-04-19 NOTE — Telephone Encounter (Signed)
Please give the order.  Thanks.   

## 2018-04-20 ENCOUNTER — Other Ambulatory Visit: Payer: Self-pay

## 2018-04-20 ENCOUNTER — Inpatient Hospital Stay: Payer: Medicare Other | Admitting: Hospice and Palliative Medicine

## 2018-04-20 ENCOUNTER — Encounter: Payer: Self-pay | Admitting: Oncology

## 2018-04-20 ENCOUNTER — Inpatient Hospital Stay: Payer: Medicare Other

## 2018-04-20 ENCOUNTER — Inpatient Hospital Stay: Payer: Medicare Other | Attending: Oncology

## 2018-04-20 ENCOUNTER — Inpatient Hospital Stay (HOSPITAL_BASED_OUTPATIENT_CLINIC_OR_DEPARTMENT_OTHER): Payer: Medicare Other | Admitting: Oncology

## 2018-04-20 VITALS — BP 120/78 | HR 66 | Temp 98.1°F | Resp 18 | Wt 200.7 lb

## 2018-04-20 DIAGNOSIS — R740 Nonspecific elevation of levels of transaminase and lactic acid dehydrogenase [LDH]: Secondary | ICD-10-CM

## 2018-04-20 DIAGNOSIS — Z5111 Encounter for antineoplastic chemotherapy: Secondary | ICD-10-CM

## 2018-04-20 DIAGNOSIS — K219 Gastro-esophageal reflux disease without esophagitis: Secondary | ICD-10-CM

## 2018-04-20 DIAGNOSIS — M1990 Unspecified osteoarthritis, unspecified site: Secondary | ICD-10-CM

## 2018-04-20 DIAGNOSIS — Z800 Family history of malignant neoplasm of digestive organs: Secondary | ICD-10-CM

## 2018-04-20 DIAGNOSIS — N10 Acute tubulo-interstitial nephritis: Secondary | ICD-10-CM | POA: Diagnosis not present

## 2018-04-20 DIAGNOSIS — M25552 Pain in left hip: Secondary | ICD-10-CM | POA: Diagnosis not present

## 2018-04-20 DIAGNOSIS — C7951 Secondary malignant neoplasm of bone: Secondary | ICD-10-CM | POA: Diagnosis not present

## 2018-04-20 DIAGNOSIS — Z85820 Personal history of malignant melanoma of skin: Secondary | ICD-10-CM

## 2018-04-20 DIAGNOSIS — R040 Epistaxis: Secondary | ICD-10-CM | POA: Insufficient documentation

## 2018-04-20 DIAGNOSIS — R509 Fever, unspecified: Secondary | ICD-10-CM | POA: Diagnosis not present

## 2018-04-20 DIAGNOSIS — C787 Secondary malignant neoplasm of liver and intrahepatic bile duct: Secondary | ICD-10-CM

## 2018-04-20 DIAGNOSIS — R109 Unspecified abdominal pain: Secondary | ICD-10-CM | POA: Insufficient documentation

## 2018-04-20 DIAGNOSIS — G8929 Other chronic pain: Secondary | ICD-10-CM | POA: Diagnosis not present

## 2018-04-20 DIAGNOSIS — Z87891 Personal history of nicotine dependence: Secondary | ICD-10-CM

## 2018-04-20 DIAGNOSIS — J449 Chronic obstructive pulmonary disease, unspecified: Secondary | ICD-10-CM | POA: Diagnosis not present

## 2018-04-20 DIAGNOSIS — I10 Essential (primary) hypertension: Secondary | ICD-10-CM

## 2018-04-20 DIAGNOSIS — R5382 Chronic fatigue, unspecified: Secondary | ICD-10-CM

## 2018-04-20 DIAGNOSIS — Z9884 Bariatric surgery status: Secondary | ICD-10-CM

## 2018-04-20 DIAGNOSIS — Z801 Family history of malignant neoplasm of trachea, bronchus and lung: Secondary | ICD-10-CM | POA: Insufficient documentation

## 2018-04-20 DIAGNOSIS — Z87440 Personal history of urinary (tract) infections: Secondary | ICD-10-CM

## 2018-04-20 DIAGNOSIS — Z803 Family history of malignant neoplasm of breast: Secondary | ICD-10-CM | POA: Insufficient documentation

## 2018-04-20 DIAGNOSIS — J329 Chronic sinusitis, unspecified: Secondary | ICD-10-CM | POA: Diagnosis not present

## 2018-04-20 DIAGNOSIS — Z9989 Dependence on other enabling machines and devices: Secondary | ICD-10-CM

## 2018-04-20 DIAGNOSIS — G629 Polyneuropathy, unspecified: Secondary | ICD-10-CM

## 2018-04-20 DIAGNOSIS — N3000 Acute cystitis without hematuria: Secondary | ICD-10-CM | POA: Diagnosis not present

## 2018-04-20 DIAGNOSIS — Z79899 Other long term (current) drug therapy: Secondary | ICD-10-CM

## 2018-04-20 DIAGNOSIS — Z5112 Encounter for antineoplastic immunotherapy: Secondary | ICD-10-CM | POA: Insufficient documentation

## 2018-04-20 DIAGNOSIS — C155 Malignant neoplasm of lower third of esophagus: Secondary | ICD-10-CM | POA: Diagnosis not present

## 2018-04-20 DIAGNOSIS — Z8042 Family history of malignant neoplasm of prostate: Secondary | ICD-10-CM | POA: Insufficient documentation

## 2018-04-20 DIAGNOSIS — G4733 Obstructive sleep apnea (adult) (pediatric): Secondary | ICD-10-CM | POA: Diagnosis not present

## 2018-04-20 DIAGNOSIS — Z923 Personal history of irradiation: Secondary | ICD-10-CM | POA: Diagnosis not present

## 2018-04-20 DIAGNOSIS — Z8052 Family history of malignant neoplasm of bladder: Secondary | ICD-10-CM

## 2018-04-20 LAB — COMPREHENSIVE METABOLIC PANEL
ALT: 36 U/L (ref 0–44)
ANION GAP: 9 (ref 5–15)
AST: 31 U/L (ref 15–41)
Albumin: 3.2 g/dL — ABNORMAL LOW (ref 3.5–5.0)
Alkaline Phosphatase: 86 U/L (ref 38–126)
BUN: 8 mg/dL (ref 8–23)
CO2: 29 mmol/L (ref 22–32)
Calcium: 8.9 mg/dL (ref 8.9–10.3)
Chloride: 105 mmol/L (ref 98–111)
Creatinine, Ser: 0.76 mg/dL (ref 0.44–1.00)
GFR calc Af Amer: >60 mL/min (ref >60)
GFR calc non Af Amer: >60 mL/min (ref >60)
Glucose, Bld: 119 mg/dL — ABNORMAL HIGH (ref 70–99)
Potassium: 3.7 mmol/L (ref 3.5–5.1)
Sodium: 143 mmol/L (ref 135–145)
Total Bilirubin: 0.6 mg/dL (ref 0.3–1.2)
Total Protein: 6.7 g/dL (ref 6.5–8.1)

## 2018-04-20 LAB — URINALYSIS, COMPLETE (UACMP) WITH MICROSCOPIC
Bilirubin Urine: NEGATIVE
Glucose, UA: NEGATIVE mg/dL
Hgb urine dipstick: NEGATIVE
Ketones, ur: NEGATIVE mg/dL
Leukocytes, UA: NEGATIVE
Nitrite: NEGATIVE
Protein, ur: NEGATIVE mg/dL
Specific Gravity, Urine: 1.005 (ref 1.005–1.030)
pH: 5 (ref 5.0–8.0)

## 2018-04-20 LAB — CBC WITH DIFFERENTIAL/PLATELET
Abs Immature Granulocytes: 0.44 10*3/uL — ABNORMAL HIGH (ref 0.00–0.07)
Basophils Absolute: 0.1 10*3/uL (ref 0.0–0.1)
Basophils Relative: 1 %
Eosinophils Absolute: 0.1 10*3/uL (ref 0.0–0.5)
Eosinophils Relative: 2 %
HCT: 34.9 % — ABNORMAL LOW (ref 36.0–46.0)
HEMOGLOBIN: 11.2 g/dL — AB (ref 12.0–15.0)
Immature Granulocytes: 8 %
LYMPHS ABS: 0.8 10*3/uL (ref 0.7–4.0)
Lymphocytes Relative: 15 %
MCH: 33.3 pg (ref 26.0–34.0)
MCHC: 32.1 g/dL (ref 30.0–36.0)
MCV: 103.9 fL — AB (ref 80.0–100.0)
Monocytes Absolute: 0.8 10*3/uL (ref 0.1–1.0)
Monocytes Relative: 14 %
Neutro Abs: 3.5 10*3/uL (ref 1.7–7.7)
Neutrophils Relative %: 60 %
Platelets: 346 10*3/uL (ref 150–400)
RBC: 3.36 MIL/uL — ABNORMAL LOW (ref 3.87–5.11)
RDW: 16.1 % — ABNORMAL HIGH (ref 11.5–15.5)
WBC: 5.8 10*3/uL (ref 4.0–10.5)
nRBC: 0.3 % — ABNORMAL HIGH (ref 0.0–0.2)

## 2018-04-20 MED ORDER — SULFAMETHOXAZOLE-TRIMETHOPRIM 800-160 MG PO TABS: 1.0000 | ORAL_TABLET | Freq: Two times a day (BID) | ORAL | 0 refills | Status: DC

## 2018-04-20 MED ORDER — HEPARIN SOD (PORK) LOCK FLUSH 100 UNIT/ML IV SOLN
500.0000 [IU] | Freq: Once | INTRAVENOUS | Status: AC
  Administered 2018-04-20: 500 [IU] via INTRAVENOUS

## 2018-04-20 MED ORDER — SODIUM CHLORIDE 0.9% FLUSH
10.0000 mL | INTRAVENOUS | Status: DC | PRN
  Administered 2018-04-20: 10 mL via INTRAVENOUS
  Filled 2018-04-20: qty 10

## 2018-04-20 MED ORDER — HEPARIN SOD (PORK) LOCK FLUSH 100 UNIT/ML IV SOLN
INTRAVENOUS | Status: AC
  Filled 2018-04-20: qty 5

## 2018-04-20 NOTE — Progress Notes (Addendum)
Hematology/Oncology Follow Up Note [facility]  [phone] [phone]  Patient Care Team: Tonia Ghent, MD as PCP - General (Family Medicine)   Name of the patient: [name]  [id number]  [birth year]   REASON FOR VISIT Follow up of chemotherapy management of esophageal adenocarcinoma  HISTORY OF PRESENT ILLNESS Patient is a 83 year old female who has metastatic esophageal cancer with liver involvement.  Weight loss 20 pounds for the past year prior to diagnosis.  # EGD during admission showed partially obstructive esophageal mass at the distal part of esophagus. Biopsy was taken. Pathology showed esophageal adenocarcinoma. #US biopsy of liver lesion to proof distant metastasis.  Report family history of breast cancer, but she has been having routine mammograms.  # Lives alone. She's a former smoker.  # Molecular Testing:  #[employer] Cdx: No reportable alternations with companion diagnostic (CDx) claims.  MS stable Tumor mutation burden: Cannot be determined, CCND3 amplification: Equivocal.  Amended reports on 06/04/2017, Tumor mutational burden changed from can not be determined to "TMB -low" on the re-analyzed samples.  # HER 2 negative.   Current Treatment S/p 6 cycles of FOLFOX, PET scan showed excellent partial response from both chemotherapy and radiation, result was discussed with patient.  currently on 5-FU maintenance.  S/p palliative radiation in January 2019 # Developed esophageal stricture secondary to radiation in April and got dilation via endoscopy. EGD shoed Benign-appearing esophageal stenosis. This is a result of radiation therapy.- LA Grade C radiation esophagitis. - The previously noted esophageal mass in Nov 2018 was much decreased indicating good response to radiation/chemotherapy. - Normal stomach.- Normal examined jejunum.- No specimens collected.    INTERVAL HISTORY  Patient with above history presents  for assessment prior to second line chemotherapy for treatment of metastatic esophageal cancer. # Recent admission due to AMS, Klebsiella UTI, Sepsis, pyelonephritis. Treated with IV Ceftriaxone and symptoms improved. Discharged home with 3 days of Keflex. Also has transaminitis, seen by GI.   # Today she reports feeling well, but not back her baseline yet.  Chronic fatigue, slightly worsened.  Reports left flank pain, constant, not associated with fever or chills.  Denies dysuria, increased frequency or urgency. She never had urinary symptoms when she was diagnosed with UTI.   # Chronic abdominal pain, band like wrapping about her stomach area, no alleviating or exacerbating symptoms.  #Chronic neuropathy, stable at baseline.  Takes Lyrica. #Sinusitis, symptoms have improved with Augmentin, however, worsened while off antibiotics.  She has been having bloody nose discharges from nostril.  No headache or sinus pain.    Review of Systems  Constitutional: Positive for appetite change and fatigue. Negative for chills, fever and unexpected weight change.  HENT:   Negative for hearing loss and voice change.        Chronic sinus congestion.   Eyes: Negative for eye problems.  Respiratory: Negative for chest tightness and cough.   Cardiovascular: Negative for chest pain.  Gastrointestinal: Negative for abdominal distention, abdominal pain and blood in stool.  Endocrine: Negative for hot flashes.  Genitourinary: Negative for difficulty urinating and frequency.   Musculoskeletal: Positive for flank pain. Negative for arthralgias.  Skin: Negative for itching and rash.  Neurological: Positive for numbness. Negative for extremity weakness.  Hematological: Negative for adenopathy.  Psychiatric/Behavioral: Negative for confusion.     Allergies  Allergen Reactions  . Cefuroxime Nausea Only  . Codeine Nausea Only  . Doxycycline Other (See Comments)    Lip swelling  .  Gabapentin Other (See  Comments)    Swelling.   . Iohexol Itching    07-15-13 pt developed itching on fingers after contrast given. Dr. Irish Elders looked at pt and said to put in system as allergy. BB  . Other Other (See Comments)    Contrast Dye- caused fever, chills, awful feeling Bandages - itching, rash  . Oxycodone Other (See Comments)    nausea  . Quinolones Swelling    Lip swelling  . Synvisc [Hylan G-F 20] Swelling     Past Medical History:  Diagnosis Date  . Arthritis   . Asthma   . COPD (chronic obstructive pulmonary disease) ([place])   . Dysphonia   . Dyspnea   . Esophageal cancer ([place])   . GERD (gastroesophageal reflux disease)   . Heart murmur   . History of kidney stones   . Hypertension   . Hypokalemia 10/07/2017  . Melanoma ([place])   . OAB (overactive bladder)   . OSA on CPAP   . 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 Personal history of chemotherapy    esophageal cancer  . Personal history of radiation therapy    esophageal cancer  . Pupil asymmetry    R pupil defect  . Skin cancer   . Sleep apnea   . Spinal cord abscess      Past Surgical History:  Procedure Laterality Date  . ABDOMINAL HYSTERECTOMY    . ANTERIOR CERVICAL DECOMP/DISCECTOMY FUSION N/A 07/16/2013   Procedure: ANTERIOR CERVICAL DECOMPRESSION/DISCECTOMY FUSION mutlipleLEVELS C4-7;  Surgeon: Erline Levine, MD;  Location: [place] NEURO ORS;  Service: Neurosurgery;  Laterality: N/A;  . APPENDECTOMY    . carpel tunn Bilateral   . CATARACT EXTRACTION    . CATARACT EXTRACTION, BILATERAL    . CHOLECYSTECTOMY    . dental implant    . ESOPHAGOGASTRODUODENOSCOPY Left 03/21/2017   Procedure: ESOPHAGOGASTRODUODENOSCOPY (EGD);  Surgeon: Virgel Manifold, MD;  Location: [facility] ENDOSCOPY;  Service: Endoscopy;  Laterality: Left;  . ESOPHAGOGASTRODUODENOSCOPY (EGD) WITH PROPOFOL N/A 08/20/2017   Procedure: ESOPHAGOGASTRODUODENOSCOPY (EGD) WITH PROPOFOL;  Surgeon: Virgel Manifold, MD;  Location: [facility] ENDOSCOPY;   Service: Endoscopy;  Laterality: N/A;  . ESOPHAGOGASTRODUODENOSCOPY (EGD) WITH PROPOFOL N/A 09/19/2017   Procedure: ESOPHAGOGASTRODUODENOSCOPY (EGD) WITH PROPOFOL;  Surgeon: Virgel Manifold, MD;  Location: [facility] ENDOSCOPY;  Service: Endoscopy;  Laterality: N/A;  . ESOPHAGOGASTRODUODENOSCOPY (EGD) WITH PROPOFOL N/A 10/03/2017   Procedure: ESOPHAGOGASTRODUODENOSCOPY (EGD) WITH PROPOFOL;  Surgeon: Virgel Manifold, MD;  Location: [facility] ENDOSCOPY;  Service: Endoscopy;  Laterality: N/A;  . ESOPHAGOGASTRODUODENOSCOPY (EGD) WITH PROPOFOL N/A 10/22/2017   Procedure: ESOPHAGOGASTRODUODENOSCOPY (EGD) WITH PROPOFOL;  Surgeon: Virgel Manifold, MD;  Location: [facility] ENDOSCOPY;  Service: Endoscopy;  Laterality: N/A;  . ESOPHAGOGASTRODUODENOSCOPY (EGD) WITH PROPOFOL N/A 11/18/2017   Procedure: ESOPHAGOGASTRODUODENOSCOPY (EGD) WITH PROPOFOL;  Surgeon: Virgel Manifold, MD;  Location: [facility] ENDOSCOPY;  Service: Endoscopy;  Laterality: N/A;  . ESOPHAGOGASTRODUODENOSCOPY (EGD) WITH PROPOFOL N/A 12/17/2017   Procedure: ESOPHAGOGASTRODUODENOSCOPY (EGD) WITH PROPOFOL;  Surgeon: Virgel Manifold, MD;  Location: [facility] ENDOSCOPY;  Service: Endoscopy;  Laterality: N/A;  . ESOPHAGOGASTRODUODENOSCOPY (EGD) WITH PROPOFOL N/A 01/08/2018   Procedure: ESOPHAGOGASTRODUODENOSCOPY (EGD) WITH PROPOFOL;  Surgeon: Virgel Manifold, MD;  Location: [facility] ENDOSCOPY;  Service: Endoscopy;  Laterality: N/A;  . ESOPHAGOGASTRODUODENOSCOPY (EGD) WITH PROPOFOL N/A 01/22/2018   Procedure: ESOPHAGOGASTRODUODENOSCOPY (EGD) WITH PROPOFOL;  Surgeon: Virgel Manifold, MD;  Location: [facility] ENDOSCOPY;  Service: Endoscopy;  Laterality: N/A;  . EYE SURGERY    .  GASTRIC BYPASS    . HERNIA REPAIR    . HIATAL HERNIA REPAIR    . JOINT REPLACEMENT    . MELANOMA EXCISION    . PORTA CATH INSERTION N/A 04/07/2017   Procedure: PORTA CATH INSERTION;  Surgeon: Algernon Huxley, MD;  Location: [place] CV LAB;  Service: Cardiovascular;  Laterality:  N/A;  . POSTERIOR CERVICAL FUSION/FORAMINOTOMY N/A 07/28/2013   Procedure: Cervical four-seven  posterior cervical fusion;  Surgeon: Erline Levine, MD;  Location: [place] [facility]URO ORS;  Service: Neurosurgery;  Laterality: N/A;  . TONSILLECTOMY    . TOTAL KNEE ARTHROPLASTY      Social History   Socioeconomic History  . Marital status: Widowed    Spouse name: Not on file  . Number of children: 2  . Years of education: Not on file  . Highest education level: Master's degree (e.g., MA, MS, MEng, MEd, MSW, MBA)  Occupational History  . Not on file  Social Needs  . Financial resource strain: Not hard at all  . Food insecurity:    Worry: Never true    Inability: Never true  . Transportation needs:    Medical: No    Non-medical: No  Tobacco Use  . Smoking status: Former Smoker    Packs/day: 1.00    Years: 20.00    Pack years: 20.00    Types: Cigarettes    Last attempt to quit: 1977    Years since quitting: 42.9  . Smokeless tobacco: Never Used  Substance and Sexual Activity  . Alcohol use: No  . Drug use: Never  . Sexual activity: Not Currently  Lifestyle  . Physical activity:    Days per week: 2 days    Minutes per session: 30 min  . Stress: Not at all  Relationships  . Social connections:    Talks on phone: More than three times a week    Gets together: More than three times a week    Attends religious service: More than 4 times per year    Active member of club or organization: Yes    Attends meetings of clubs or organizations: More than 4 times per year    Relationship status: Widowed  . Intimate partner violence:    Fear of current or ex partner: No    Emotionally abused: No    Physically abused: No    Forced sexual activity: No  Other Topics Concern  . Not on file  Social History Narrative   Marital Status- Widowed    Lives by herself    Employement- Retired Pharmacist, hospital   Exercise hx- Does PT     Family History  Problem Relation Age of Onset  . Breast cancer Mother  81  . Arthritis-Osteo Mother   . Breast cancer Sister 57  . Bladder Cancer Sister   . Bladder Cancer Father   . Tongue cancer Father   . Congenital heart disease Father   . Prostate cancer Brother   . Uterine cancer Maternal Aunt   . Lung cancer Paternal Uncle   . Leukemia Maternal Grandmother   . Liver cancer Paternal Grandmother   . Colon cancer Neg Hx      Current Outpatient Medications:  .  albuterol (PROVENTIL HFA;VENTOLIN HFA) 108 (90 Base) MCG/ACT inhaler, Inhale 2 puffs into the lungs every 4 (four) hours as needed for wheezing or shortness of breath., Disp: , Rfl:  .  calcium carbonate (TUMS EX) 750 MG chewable tablet, Chew 1 tablet by mouth as needed  for heartburn. , Disp: , Rfl:  .  CARAFATE 1 GM/10ML suspension, TAKE 10MLS BY MOUTH FOUR TIMES DAILY (Patient taking differently: Take 1 g by mouth 4 (four) times daily. ), Disp: 420 mL, Rfl: 1 .  chlorhexidine (PERIDEX) 0.12 % solution, Use as directed 15 mLs in the mouth or throat 2 (two) times daily., Disp: , Rfl:  .  Cholecalciferol (VITAMIN D) 2000 UNITS CAPS, Take 2,000 Units by mouth daily., Disp: , Rfl:  .  cyanocobalamin 500 MCG tablet, Take 500 mcg every other day by mouth., Disp: , Rfl:  .  Diclofenac Sodium 1 % CREA, Apply to left arm as needed for pain three times daily, Disp: 120 g, Rfl: 0 .  dronabinol (MARINOL) 5 MG capsule, Take 1 capsule (5 mg total) by mouth 2 (two) times daily before a meal., Disp: 60 capsule, Rfl: 0 .  fluticasone (FLONASE) 50 MCG/ACT nasal spray, Place 1-2 sprays into both nostrils daily., Disp: 16 g, Rfl: 5 .  furosemide (LASIX) 20 MG tablet, Take 1 tablet (20 mg total) by mouth daily., Disp: 90 tablet, Rfl: 3 .  hydrocortisone cream 1 %, Apply 1 application topically daily as needed for itching. , Disp: , Rfl:  .  loratadine (CLARITIN) 10 MG tablet, Take 10 mg by mouth daily., Disp: , Rfl:  .  LYRICA 150 MG capsule, TAKE 1 CAPSULE BY MOUTH TWO TIMES DAILY (Patient taking differently: Take  150 mg by mouth 2 (two) times daily. ), Disp: 180 capsule, Rfl: 1 .  metoprolol succinate (TOPROL-XL) 50 MG 24 hr tablet, TAKE 1 TABLET BY MOUTH DAILY, Disp: 90 tablet, Rfl: 1 .  mirabegron ER (MYRBETRIQ) 25 MG TB24 tablet, Take 1 tablet (25 mg total) by mouth daily., Disp: 30 tablet, Rfl: 2 .  Multiple Vitamins-Minerals (MULTIVITAMIN ADULT) CHEW, Chew 1 tablet by mouth daily. , Disp: , Rfl:  .  pantoprazole (PROTONIX) 40 MG tablet, TAKE ONE TABLET TWICE DAILY BEFORE A MEAL (Patient taking differently: Take 40 mg by mouth 2 (two) times daily before a meal. ), Disp: 60 tablet, Rfl: 1 .  Polyethyl Glycol-Propyl Glycol (SYSTANE OP), Apply 1 drop to eye daily as needed (dry eyes)., Disp: , Rfl:  .  potassium chloride (KLOR-CON) 20 MEQ packet, Take 20 mEq by mouth daily., Disp: 30 packet, Rfl: 1 .  pyridOXINE (VITAMIN B-6) 100 MG tablet, Take 1 tablet (100 mg total) by mouth daily., Disp: 30 tablet, Rfl: 3 .  senna-docusate (SENOKOT-S) 8.6-50 MG tablet, Take 1 tablet at bedtime as needed by mouth for mild constipation., Disp: , Rfl:  .  traMADol (ULTRAM) 50 MG tablet, Take 1 tablet (50 mg total) by mouth every 6 (six) hours as needed., Disp: 30 tablet, Rfl: 0 .  traZODone (DESYREL) 50 MG tablet, TAKE 1/2 TABLET BY MOUTH AT BEDTIME IF NEEDED FOR SLEEP (Patient taking differently: Take 25 mg by mouth at bedtime as needed for sleep. ), Disp: 45 tablet, Rfl: 3 No current facility-administered medications for this visit.   Facility-Administered Medications Ordered in Other Visits:  .  sodium chloride flush (NS) 0.9 % injection 10 mL, 10 mL, Intravenous, PRN, Earlie Server, MD, 10 mL at 01/26/18 0904  Physical exam:  Vitals:   04/20/18 0916  BP: 120/78  Pulse: 66  Resp: 18  Temp: 98.1 F (36.7 C)  TempSrc: Tympanic  Weight: 200 lb 11.2 oz (91 kg)  ECOG 1 Physical Exam  Constitutional: She is oriented to person, place, and time. No distress.  HENT:  Head: Normocephalic and atraumatic.  Nose: Nose  normal.  Mouth/Throat: Oropharynx is clear and moist. No oropharyngeal exudate.  Eyes: Pupils are equal, round, and reactive to light. Conjunctivae and EOM are normal. No scleral icterus.  Neck: Normal range of motion. Neck supple. No JVD present.  Cardiovascular: Normal rate and regular rhythm.  Murmur heard. Pulmonary/Chest: Effort normal and breath sounds normal. No respiratory distress. She has no wheezes. She has no rales. She exhibits no tenderness.  Abdominal: Soft. Bowel sounds are normal. She exhibits no distension. There is no tenderness.  Musculoskeletal: Normal range of motion. She exhibits no edema, tenderness or deformity.  Lymphadenopathy:    She has no cervical adenopathy.  Neurological: She is alert and oriented to person, place, and time. No cranial nerve deficit. She exhibits normal muscle tone. Coordination normal.  Skin: Skin is warm and dry. She is not diaphoretic. No erythema.  Psychiatric: Affect and judgment normal.    CMP Latest Ref Rng & Units 04/14/2018  Glucose 70 - 99 mg/dL 92  BUN 8 - 23 mg/dL 17  Creatinine 0.44 - 1.00 mg/dL 0.54  Sodium 135 - 145 mmol/L 139  Potassium 3.5 - 5.1 mmol/L 3.6  Chloride 98 - 111 mmol/L 103  CO2 22 - 32 mmol/L 28  Calcium 8.9 - 10.3 mg/dL 8.2(L)  Total Protein 6.5 - 8.1 g/dL 5.6(L)  Total Bilirubin 0.3 - 1.2 mg/dL 0.9  Alkaline Phos 38 - 126 U/L 132(H)  AST 15 - 41 U/L 135(H)  ALT 0 - 44 U/L 152(H)   CBC Latest Ref Rng & Units 04/14/2018  WBC 4.0 - 10.5 K/uL 2.1(L)  Hemoglobin 12.0 - 15.0 g/dL 10.9(L)  Hematocrit 36.0 - 46.0 % 33.9(L)  Platelets 150 - 400 K/uL 132(L)    RADIOGRAPHIC STUDIES: I have personally reviewed the radiological images as listed and agreed with the findings in the report.  PET scan 01/23/2018  Interval development of loculated ascites within the upper abdomen which overlies the liver and spleen and exhibits increased radiotracer uptake. Findings worrisome for peritoneal carcinomatosis. Consider  further evaluation with diagnostic paracentesis. 2. Soft tissue infiltration within the omentum with increased radiotracer uptake noted. Also worrisome for peritoneal carcinomatosis. 3. New hypermetabolic internal mammary lymph nodes and right CP angle lymph node. Suspicious for metastatic adenopathy. 4. Mild FDG uptake is identified localizing to the lower third of the esophagus within SUV max of 5.15.  Assessment and plan  Cancer Staging Malignant neoplasm of lower third of esophagus [facility]) Staging form: Esophagus - Adenocarcinoma, AJCC 8th Edition - Clinical stage from 04/10/2017: Stage IVB (cTX, cNX, pM1) - Signed by Earlie Server, MD on 04/10/2017  1. Acute pyelonephritis   2. Chronic sinusitis, unspecified location   3. Malignant neoplasm of lower third of esophagus (HCC)   4. Neuropathy    # Left flank pain, recent UTI/left pyelonephritis,  She finished 3 days of Keflex outpatient.  Still having left flank pain.  Repeat UTI and urine culture. Hold chemotherapy.  I will restart patient on empiric antibiotics with Bactrim DS. Rx sent to pharmacy.   #Esophageal cancer, currently on second line treatment with Cyramza and paclitaxel.  Today she may have ongoing infection, and feels weaker than her baseline.  I will hold chemotherapy and postponed to next week. MRI abdomen with and without, MRCP on 04/13/2018 independently reviewed by me.  #Transaminitis, completely resolved. #Chronic sinusitis, bloody nasal discharge, refer to ENT. #  Chronic neuropathy: Continue Lyrica.  stable symptoms.    Follow-up  1 week Total face to face encounter time for this patient visit was 25 min. >50% of the time was  spent in counseling and coordination of care.   Earlie Server, MD, PhD Hematology Oncology [facility] Pager- [phone] 04/20/18

## 2018-04-20 NOTE — Addendum Note (Signed)
Addended by: Earlie Server on: 04/20/2018 12:47 PM   Modules accepted: Orders

## 2018-04-20 NOTE — Telephone Encounter (Signed)
Left detailed message on voicemail of [name] with [facility].

## 2018-04-20 NOTE — Progress Notes (Signed)
Patient here for follow up. Pt with sinus problems.

## 2018-04-21 ENCOUNTER — Telehealth: Payer: Self-pay

## 2018-04-21 LAB — CEA: CEA: 2.8 ng/mL (ref 0.0–4.7)

## 2018-04-21 NOTE — Telephone Encounter (Signed)
[name] OT with [facility] said that pt had declined OT eval but pt does want to continue PT. FYI to Dr Damita Dunnings. [name] does not need cb.

## 2018-04-22 LAB — URINE CULTURE: CULTURE: NO GROWTH

## 2018-04-22 NOTE — Telephone Encounter (Signed)
Noted. Thanks.

## 2018-04-24 ENCOUNTER — Encounter: Payer: Self-pay | Admitting: Family Medicine

## 2018-04-24 ENCOUNTER — Ambulatory Visit (INDEPENDENT_AMBULATORY_CARE_PROVIDER_SITE_OTHER): Payer: Medicare Other | Admitting: Family Medicine

## 2018-04-24 VITALS — BP 144/70 | HR 62 | Temp 97.6°F | Ht 62.0 in | Wt 202.2 lb

## 2018-04-24 DIAGNOSIS — R109 Unspecified abdominal pain: Secondary | ICD-10-CM

## 2018-04-24 DIAGNOSIS — R945 Abnormal results of liver function studies: Secondary | ICD-10-CM

## 2018-04-24 DIAGNOSIS — A419 Sepsis, unspecified organism: Secondary | ICD-10-CM

## 2018-04-24 DIAGNOSIS — R7989 Other specified abnormal findings of blood chemistry: Secondary | ICD-10-CM

## 2018-04-24 MED ORDER — SUCRALFATE 1 GM/10ML PO SUSP: 1.0000 g | Freq: Four times a day (QID) | ORAL | Status: DC

## 2018-04-24 MED ORDER — PANTOPRAZOLE SODIUM 40 MG PO TBEC: 40.0000 mg | DELAYED_RELEASE_TABLET | Freq: Two times a day (BID) | ORAL | Status: DC

## 2018-04-24 MED ORDER — TRAZODONE HCL 50 MG PO TABS: 25.0000 mg | ORAL_TABLET | Freq: Every evening | ORAL | Status: DC | PRN

## 2018-04-24 MED ORDER — TRAMADOL HCL 50 MG PO TABS: 50.0000 mg | ORAL_TABLET | Freq: Four times a day (QID) | ORAL | 1 refills | Status: DC | PRN

## 2018-04-24 MED ORDER — PREGABALIN 150 MG PO CAPS: 150.0000 mg | ORAL_CAPSULE | Freq: Two times a day (BID) | ORAL | Status: DC

## 2018-04-24 NOTE — Patient Instructions (Signed)
No labs today.  Update me as needed.  Try to restart carafate.  I'll await the notes from the oncology clinic.  Take care.  Glad to see you.

## 2018-04-24 NOTE — Progress Notes (Signed)
DATE OF ADMISSION:  04/12/2018      ADMITTING PHYSICIAN: Lance Coon, MD  DATE OF DISCHARGE: 04/14/2018  2:18 PM  PRIMARY CARE PHYSICIAN: Tonia Ghent, MD   ADMISSION DIAGNOSIS:  Lower urinary tract infection [N39.0] Elevated LFTs [R94.5] Altered mental status, unspecified altered mental status type [R41.82] AMS (altered mental status) [R41.82] Sepsis, due to unspecified organism, unspecified whether acute organ dysfunction present ([place]) [A41.9] DISCHARGE DIAGNOSIS:  Principal Problem:   Sepsis ([place]) Active Problems:   Essential hypertension   Malignant neoplasm of lower third of esophagus ([place])   UTI (urinary tract infection)   Transaminitis   Palliative care encounter   Abnormal LFTs (liver function tests)   Altered mental status  SECONDARY DIAGNOSIS:       Past Medical History:  Diagnosis Date  . Arthritis   . Asthma   . COPD (chronic obstructive pulmonary disease) ([place])   . Dysphonia   . Dyspnea   . Esophageal cancer ([place])   . GERD (gastroesophageal reflux disease)   . Heart murmur   . History of kidney stones   . Hypertension   . Hypokalemia 10/07/2017  . Melanoma ([place])   . OAB (overactive bladder)   . OSA on CPAP   . 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 Personal history of chemotherapy    esophageal cancer  . Personal history of radiation therapy    esophageal cancer  . Pupil asymmetry    R pupil defect  . Skin cancer   . Sleep apnea   . Spinal cord abscess    HOSPITAL COURSE:  Patient is a38 y.o.femalewith history of metastatic esophageal cancer, currently on second line chemotherapy, history of melanoma, heart murmur, COPD, OSA,admitted foraltered mental status.  * Acute Metabolic Encephalopathy - due to sepsis. Resolved with treatment - CT head neg for acute patho  * Sepsis: present on admission, due to UTI  *Klebsiella UTI: per urine culture, improving with Abx  *Leukopenia,  likely chemotherapy induced  *Transaminitis/hyperbilirubinemia/dilated CBD - could be from mets normal MRCP abdomen  *Metastatic Esophageal cancer - f/by Onco Dr Tasia Catchings as an outpt. May need PET as scheduled  * Essential hypertension -stable on home dose antihypertensives ==================================================  Inpatient course discussed with patient.  Past medical history noted.  She was admitted with encephalopathy presumed to be due to sepsis.  She was treated with antibiotics.  Sepsis pathophysiology and rationale for treatment.  Discussed with patient.  She has metastatic esophageal cancer and she has follow-up PET scan scheduled.  She still on her baseline antihypertensive otherwise.  Transaminitis discussed with patient.  She feels pretty well back to normal except for some fatigue.  She also has some upper abdominal pain.  No suprapubic pain.  The upper abdominal pain is not severe and she has not been consistent with taking Carafate.  It is difficult to take the medication 4 times a day, in terms of scheduling that.  Discussed with patient.  She is going to try to be more compliant with that.  She is not throwing up blood or passing blood.  No fevers or chills otherwise.  She needed a refill on tramadol as she uses that occasionally. No adverse effect on medication.  PMH and SH reviewed  ROS: Per HPI unless specifically indicated in ROS section   Meds, vitals, and allergies reviewed.   GEN: nad, alert and oriented HEENT: mucous membranes moist NECK: supple w/o LA CV: rrr. PULM: ctab, no inc wob  ABD: soft, +bs, upper abdomen minimally tender to palpation in the midline but not tender to palpation otherwise.  No rebound. EXT: no edema SKIN: no acute rash but she has some resolving bruising on the extremities related to phlebotomy as inpatient  Recent labs discussed with patient.

## 2018-04-26 NOTE — Assessment & Plan Note (Signed)
Discussed with patient.  She has oncology follow-up pending along with PET scan.  I will defer at this point.  I do not think this is related to her upper abdominal pain as she does not have any discomfort in the right upper quadrant.

## 2018-04-26 NOTE — Assessment & Plan Note (Addendum)
This is more midline and it could be GERD related.  Discussed with patient about using Carafate up to 4 times a day to see if this makes a difference.  No rebound and still okay for outpatient follow-up.  She does not have suprapubic pain that this does not appear to be related to her previous UTI.

## 2018-04-26 NOTE — Assessment & Plan Note (Signed)
Pathophysiology and rationale for care discussed with patient.  Clearly improved in the meantime.  At this point still okay for outpatient follow-up.  Given the recent labs she had collected, no repeat labs done today.  Okay for outpatient follow-up and update me as needed.  She agrees to plan.  See after visit summary. >25 minutes spent in face to face time with patient, >50% spent in counselling or coordination of care.

## 2018-04-27 ENCOUNTER — Other Ambulatory Visit: Payer: Self-pay

## 2018-04-27 ENCOUNTER — Inpatient Hospital Stay: Payer: Medicare Other

## 2018-04-27 ENCOUNTER — Inpatient Hospital Stay (HOSPITAL_BASED_OUTPATIENT_CLINIC_OR_DEPARTMENT_OTHER): Payer: Medicare Other | Admitting: Oncology

## 2018-04-27 ENCOUNTER — Ambulatory Visit
Admission: RE | Admit: 2018-04-27 | Discharge: 2018-04-27 | Disposition: A | Discharge status: Home / Self Care | Payer: Medicare Other | Source: Ambulatory Visit | Attending: Radiation Oncology | Admitting: Radiation Oncology

## 2018-04-27 ENCOUNTER — Encounter: Payer: Self-pay | Admitting: Oncology

## 2018-04-27 VITALS — BP 125/79 | HR 71 | Temp 97.0°F | Wt 199.4 lb

## 2018-04-27 DIAGNOSIS — C155 Malignant neoplasm of lower third of esophagus: Secondary | ICD-10-CM | POA: Diagnosis not present

## 2018-04-27 DIAGNOSIS — Z9884 Bariatric surgery status: Secondary | ICD-10-CM

## 2018-04-27 DIAGNOSIS — Z87440 Personal history of urinary (tract) infections: Secondary | ICD-10-CM

## 2018-04-27 DIAGNOSIS — J449 Chronic obstructive pulmonary disease, unspecified: Secondary | ICD-10-CM

## 2018-04-27 DIAGNOSIS — Z800 Family history of malignant neoplasm of digestive organs: Secondary | ICD-10-CM

## 2018-04-27 DIAGNOSIS — Z96659 Presence of unspecified artificial knee joint: Secondary | ICD-10-CM

## 2018-04-27 DIAGNOSIS — R740 Nonspecific elevation of levels of transaminase and lactic acid dehydrogenase [LDH]: Secondary | ICD-10-CM

## 2018-04-27 DIAGNOSIS — Z5111 Encounter for antineoplastic chemotherapy: Secondary | ICD-10-CM

## 2018-04-27 DIAGNOSIS — M1990 Unspecified osteoarthritis, unspecified site: Secondary | ICD-10-CM

## 2018-04-27 DIAGNOSIS — K219 Gastro-esophageal reflux disease without esophagitis: Secondary | ICD-10-CM

## 2018-04-27 DIAGNOSIS — C787 Secondary malignant neoplasm of liver and intrahepatic bile duct: Secondary | ICD-10-CM | POA: Diagnosis not present

## 2018-04-27 DIAGNOSIS — N390 Urinary tract infection, site not specified: Secondary | ICD-10-CM | POA: Diagnosis not present

## 2018-04-27 DIAGNOSIS — I10 Essential (primary) hypertension: Secondary | ICD-10-CM

## 2018-04-27 DIAGNOSIS — R188 Other ascites: Secondary | ICD-10-CM | POA: Diagnosis not present

## 2018-04-27 DIAGNOSIS — J45909 Unspecified asthma, uncomplicated: Secondary | ICD-10-CM

## 2018-04-27 DIAGNOSIS — K769 Liver disease, unspecified: Secondary | ICD-10-CM | POA: Insufficient documentation

## 2018-04-27 DIAGNOSIS — Z87891 Personal history of nicotine dependence: Secondary | ICD-10-CM

## 2018-04-27 DIAGNOSIS — Z85820 Personal history of malignant melanoma of skin: Secondary | ICD-10-CM

## 2018-04-27 DIAGNOSIS — Z923 Personal history of irradiation: Secondary | ICD-10-CM | POA: Insufficient documentation

## 2018-04-27 DIAGNOSIS — G629 Polyneuropathy, unspecified: Secondary | ICD-10-CM

## 2018-04-27 DIAGNOSIS — K222 Esophageal obstruction: Secondary | ICD-10-CM | POA: Diagnosis not present

## 2018-04-27 DIAGNOSIS — Z6837 Body mass index (BMI) 37.0-37.9, adult: Secondary | ICD-10-CM

## 2018-04-27 DIAGNOSIS — R5382 Chronic fatigue, unspecified: Secondary | ICD-10-CM

## 2018-04-27 DIAGNOSIS — R109 Unspecified abdominal pain: Secondary | ICD-10-CM

## 2018-04-27 DIAGNOSIS — G4733 Obstructive sleep apnea (adult) (pediatric): Secondary | ICD-10-CM

## 2018-04-27 DIAGNOSIS — Z79891 Long term (current) use of opiate analgesic: Secondary | ICD-10-CM

## 2018-04-27 DIAGNOSIS — Z801 Family history of malignant neoplasm of trachea, bronchus and lung: Secondary | ICD-10-CM

## 2018-04-27 DIAGNOSIS — Z9221 Personal history of antineoplastic chemotherapy: Secondary | ICD-10-CM | POA: Insufficient documentation

## 2018-04-27 DIAGNOSIS — Z981 Arthrodesis status: Secondary | ICD-10-CM

## 2018-04-27 DIAGNOSIS — E669 Obesity, unspecified: Secondary | ICD-10-CM

## 2018-04-27 DIAGNOSIS — Z9989 Dependence on other enabling machines and devices: Secondary | ICD-10-CM

## 2018-04-27 DIAGNOSIS — Z79899 Other long term (current) drug therapy: Secondary | ICD-10-CM

## 2018-04-27 DIAGNOSIS — G8929 Other chronic pain: Secondary | ICD-10-CM

## 2018-04-27 DIAGNOSIS — Z8052 Family history of malignant neoplasm of bladder: Secondary | ICD-10-CM

## 2018-04-27 DIAGNOSIS — Z803 Family history of malignant neoplasm of breast: Secondary | ICD-10-CM

## 2018-04-27 DIAGNOSIS — M4322 Fusion of spine, cervical region: Secondary | ICD-10-CM

## 2018-04-27 DIAGNOSIS — Z8042 Family history of malignant neoplasm of prostate: Secondary | ICD-10-CM

## 2018-04-27 DIAGNOSIS — M25552 Pain in left hip: Secondary | ICD-10-CM

## 2018-04-27 LAB — URINALYSIS, DIPSTICK ONLY
Bilirubin Urine: NEGATIVE
Glucose, UA: NEGATIVE mg/dL
Hgb urine dipstick: NEGATIVE
Ketones, ur: NEGATIVE mg/dL
NITRITE: NEGATIVE
PROTEIN: NEGATIVE mg/dL
Specific Gravity, Urine: 1.012 (ref 1.005–1.030)
pH: 5 (ref 5.0–8.0)

## 2018-04-27 LAB — CBC WITH DIFFERENTIAL/PLATELET
Abs Immature Granulocytes: 0.07 10*3/uL (ref 0.00–0.07)
Basophils Absolute: 0.1 10*3/uL (ref 0.0–0.1)
Basophils Relative: 1 %
Eosinophils Absolute: 0.1 10*3/uL (ref 0.0–0.5)
Eosinophils Relative: 2 %
HCT: 38.4 % (ref 36.0–46.0)
HEMOGLOBIN: 11.7 g/dL — AB (ref 12.0–15.0)
Immature Granulocytes: 1 %
LYMPHS PCT: 10 %
Lymphs Abs: 0.8 10*3/uL (ref 0.7–4.0)
MCH: 31.9 pg (ref 26.0–34.0)
MCHC: 30.5 g/dL (ref 30.0–36.0)
MCV: 104.6 fL — ABNORMAL HIGH (ref 80.0–100.0)
Monocytes Absolute: 0.7 10*3/uL (ref 0.1–1.0)
Monocytes Relative: 10 %
Neutro Abs: 5.6 10*3/uL (ref 1.7–7.7)
Neutrophils Relative %: 76 %
Platelets: 240 10*3/uL (ref 150–400)
RBC: 3.67 MIL/uL — ABNORMAL LOW (ref 3.87–5.11)
RDW: 15.6 % — ABNORMAL HIGH (ref 11.5–15.5)
WBC: 7.4 10*3/uL (ref 4.0–10.5)
nRBC: 0 % (ref 0.0–0.2)

## 2018-04-27 LAB — COMPREHENSIVE METABOLIC PANEL
ALT: 16 U/L (ref 0–44)
ANION GAP: 8 (ref 5–15)
AST: 25 U/L (ref 15–41)
Albumin: 3.1 g/dL — ABNORMAL LOW (ref 3.5–5.0)
Alkaline Phosphatase: 78 U/L (ref 38–126)
BUN: 12 mg/dL (ref 8–23)
CHLORIDE: 106 mmol/L (ref 98–111)
CO2: 28 mmol/L (ref 22–32)
Calcium: 8.7 mg/dL — ABNORMAL LOW (ref 8.9–10.3)
Creatinine, Ser: 0.78 mg/dL (ref 0.44–1.00)
GFR calc Af Amer: >60 mL/min (ref >60)
GFR calc non Af Amer: >60 mL/min (ref >60)
GLUCOSE: 87 mg/dL (ref 70–99)
Potassium: 4 mmol/L (ref 3.5–5.1)
Sodium: 142 mmol/L (ref 135–145)
Total Bilirubin: 0.4 mg/dL (ref 0.3–1.2)
Total Protein: 6.8 g/dL (ref 6.5–8.1)

## 2018-04-27 MED ORDER — DIPHENHYDRAMINE HCL 50 MG/ML IJ SOLN
50.0000 mg | Freq: Once | INTRAMUSCULAR | Status: AC
  Administered 2018-04-27: 50 mg via INTRAVENOUS
  Filled 2018-04-27: qty 1

## 2018-04-27 MED ORDER — SODIUM CHLORIDE 0.9 % IV SOLN
8.0000 mg/kg | Freq: Once | INTRAVENOUS | Status: AC
  Administered 2018-04-27: 700 mg via INTRAVENOUS
  Filled 2018-04-27: qty 70

## 2018-04-27 MED ORDER — SODIUM CHLORIDE 0.9 % IV SOLN
Freq: Once | INTRAVENOUS | Status: AC
  Administered 2018-04-27: 11:00:00 via INTRAVENOUS
  Filled 2018-04-27: qty 250

## 2018-04-27 MED ORDER — DEXAMETHASONE SODIUM PHOSPHATE 10 MG/ML IJ SOLN
10.0000 mg | Freq: Once | INTRAMUSCULAR | Status: AC
  Administered 2018-04-27: 10 mg via INTRAVENOUS
  Filled 2018-04-27: qty 1

## 2018-04-27 MED ORDER — SODIUM CHLORIDE 0.9 % IV SOLN
70.0000 mg/m2 | Freq: Once | INTRAVENOUS | Status: AC
  Administered 2018-04-27: 138 mg via INTRAVENOUS
  Filled 2018-04-27: qty 23

## 2018-04-27 MED ORDER — HEPARIN SOD (PORK) LOCK FLUSH 100 UNIT/ML IV SOLN
500.0000 [IU] | Freq: Once | INTRAVENOUS | Status: AC | PRN
  Administered 2018-04-27: 500 [IU]
  Filled 2018-04-27: qty 5

## 2018-04-27 MED ORDER — FAMOTIDINE IN NACL 20-0.9 MG/50ML-% IV SOLN
20.0000 mg | Freq: Once | INTRAVENOUS | Status: AC
  Administered 2018-04-27: 20 mg via INTRAVENOUS
  Filled 2018-04-27: qty 50

## 2018-04-27 MED ORDER — ACETAMINOPHEN 325 MG PO TABS
650.0000 mg | ORAL_TABLET | Freq: Once | ORAL | Status: AC
  Administered 2018-04-27: 650 mg via ORAL
  Filled 2018-04-27: qty 2

## 2018-04-27 NOTE — Progress Notes (Signed)
Patient here for follow up. Pt complaining of pain to abdomen.

## 2018-04-27 NOTE — Progress Notes (Signed)
Radiation Oncology Follow up Note  Name: [name]   Date:   04/27/2018 MRN:  [id number] DOB: 1935/04/25    This 83 y.o. female presents to the clinic today for 11 month follow-up status post concurrent chemoradiation therapy for distal esophageal adenocarcinoma.  REFERRING PROVIDER: Tonia Ghent, MD  HPI: patient is a 83 year old female now out 11 months having completed concurrent chemoradiation therapy for distal esophageal adenocarcinoma..she's had an excellent response in that area did develop some tricture which is been dilated multiple times. PET CT scan previously showed excellent response in this region she has been on FOLFOX chemotherapy for palliation she has developed probable carcinomatosisand small liver metastasis.she has recently been hospitalized for IV antibiotic therapy secondary to UTI.her most recent PET CT scan performed in September showed loculated ascites in the upper abdomen worrisome for peritoneal carcinomatosis she also had soft tissue infiltration of the omentum. There is only mild hypermetabolic activity in the distal third of the esophagus.  COMPLICATIONS OF TREATMENT: none  FOLLOW UP COMPLIANCE: keeps appointments   PHYSICAL EXAM:  There were no vitals taken for this visit. Well-developed well-nourished patient in NAD. HEENT reveals PERLA, EOMI, discs not visualized.  Oral cavity is clear. No oral mucosal lesions are identified. Neck is clear without evidence of cervical or supraclavicular adenopathy. Lungs are clear to A&P. Cardiac examination is essentially unremarkable with regular rate and rhythm without murmur rub or thrill. Abdomen is benign with no organomegaly or masses noted. Motor sensory and DTR levels are equal and symmetric in the upper and lower extremities. Cranial nerves II through XII are grossly intact. Proprioception is intact. No peripheral adenopathy or edema is identified. No motor or sensory levels are noted. Crude visual fields  are within normal range.  RADIOLOGY RESULTS: PET CT scan is reviewed and compatible with the above-stated findings  PLAN: the present time she is currently on second line treatment with Cyramza and paclitaxel. She is tolerating that well. I see no role for further radiation therapy at this time. I've asked to see her back in 6 months for follow-up. Be happy to reevaluate her any time should further palliative treatment be indicated.  I would like to take this opportunity to thank you for allowing me to participate in the care of your patient.Noreene Filbert, MD

## 2018-04-27 NOTE — Progress Notes (Signed)
Hematology/Oncology Follow Up Note [facility]  [phone] [phone]  Patient Care Team: Tonia Ghent, MD as PCP - General (Family Medicine)   Name of the patient: [name]  [id number]  [birth year]   REASON FOR VISIT Follow up of chemotherapy management of esophageal adenocarcinoma  HISTORY OF PRESENT ILLNESS Patient is a 83 year old female who has metastatic esophageal cancer with liver involvement.  Weight loss 20 pounds for the past year prior to diagnosis.  # EGD during admission showed partially obstructive esophageal mass at the distal part of esophagus. Biopsy was taken. Pathology showed esophageal adenocarcinoma. #US biopsy of liver lesion to proof distant metastasis.  Report family history of breast cancer, but she has been having routine mammograms.  # Lives alone. She's a former smoker.  # Molecular Testing:  #[employer] Cdx: No reportable alternations with companion diagnostic (CDx) claims.  MS stable Tumor mutation burden: Cannot be determined, CCND3 amplification: Equivocal.  Amended reports on 06/04/2017, Tumor mutational burden changed from can not be determined to "TMB -low" on the re-analyzed samples.  # HER 2 negative.   Current Treatment S/p 6 cycles of FOLFOX, PET scan showed excellent partial response from both chemotherapy and radiation, result was discussed with patient.  currently on 5-FU maintenance.  S/p palliative radiation in January 2019 # Developed esophageal stricture secondary to radiation in April and got dilation via endoscopy. EGD shoed Benign-appearing esophageal stenosis. This is a result of radiation therapy.- LA Grade C radiation esophagitis. - The previously noted esophageal mass in Nov 2018 was much decreased indicating good response to radiation/chemotherapy. - Normal stomach.- Normal examined jejunum.- No specimens collected.    INTERVAL HISTORY  Patient with above history presents  for assessment prior to second line chemotherapy for treatment of metastatic esophageal cancer  # Fatigue: reports worsening fatigue. Chronic onset, perisistent, no aggravating or improving factors, no associated symptoms.   # chronic abdominal pain, intermittent, she takes Tylenol, do not feel pain is controlled well.  # appetite is poor, reports Marinol 17m occasionally and appetite improves, but she forgets to take it.  # Left flank pain has resolved, she feels more left hip pain. Non radiating, pain is dull in nature.  # Chronic numbness, taking Lyrica, unchanged.    Review of Systems  Constitutional: Positive for appetite change and fatigue. Negative for chills, fever and unexpected weight change.  HENT:   Negative for hearing loss and voice change.        Chronic sinus congestion.   Eyes: Negative for eye problems.  Respiratory: Negative for chest tightness and cough.   Cardiovascular: Negative for chest pain.  Gastrointestinal: Negative for abdominal distention, abdominal pain and blood in stool.  Endocrine: Negative for hot flashes.  Genitourinary: Negative for difficulty urinating and frequency.   Musculoskeletal: Negative for arthralgias and flank pain.  Skin: Negative for itching and rash.  Neurological: Positive for numbness. Negative for extremity weakness.  Hematological: Negative for adenopathy.  Psychiatric/Behavioral: Negative for confusion.     Allergies  Allergen Reactions  . Cefuroxime Nausea Only  . Codeine Nausea Only  . Doxycycline Other (See Comments)    Lip swelling  . Gabapentin Other (See Comments)    Swelling.   . Iohexol Itching    07-15-13 pt developed itching on fingers after contrast given. Dr. OIrish Elderslooked at pt and said to put in system as allergy. BB  . Other Other (See Comments)    Contrast Dye- caused fever, chills, awful  feeling Bandages - itching, rash  . Oxycodone Other (See Comments)    nausea  . Quinolones Swelling    Lip swelling  .  Synvisc [Hylan G-F 20] Swelling     Past Medical History:  Diagnosis Date  . Arthritis   . Asthma   . COPD (chronic obstructive pulmonary disease) ([place])   . Dysphonia   . Dyspnea   . Esophageal cancer ([place])   . GERD (gastroesophageal reflux disease)   . Heart murmur   . History of kidney stones   . Hypertension   . Hypokalemia 10/07/2017  . Melanoma ([place])   . OAB (overactive bladder)   . OSA on CPAP   . 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 Personal history of chemotherapy    esophageal cancer  . Personal history of radiation therapy    esophageal cancer  . Pupil asymmetry    R pupil defect  . Skin cancer   . Sleep apnea   . Spinal cord abscess      Past Surgical History:  Procedure Laterality Date  . ABDOMINAL HYSTERECTOMY    . ANTERIOR CERVICAL DECOMP/DISCECTOMY FUSION N/A 07/16/2013   Procedure: ANTERIOR CERVICAL DECOMPRESSION/DISCECTOMY FUSION mutlipleLEVELS C4-7;  Surgeon: Erline Levine, MD;  Location: [place] [facility];  Service: Neurosurgery;  Laterality: N/A;  . APPENDECTOMY    . carpel tunn Bilateral   . CATARACT EXTRACTION    . CATARACT EXTRACTION, BILATERAL    . CHOLECYSTECTOMY    . dental implant    . ESOPHAGOGASTRODUODENOSCOPY Left 03/21/2017   Procedure: ESOPHAGOGASTRODUODENOSCOPY (EGD);  Surgeon: Virgel Manifold, MD;  Location: [facility];  Service: Endoscopy;  Laterality: Left;  . ESOPHAGOGASTRODUODENOSCOPY (EGD) WITH PROPOFOL N/A 08/20/2017   Procedure: ESOPHAGOGASTRODUODENOSCOPY (EGD) WITH PROPOFOL;  Surgeon: Virgel Manifold, MD;  Location: [facility] ENDOSCOPY;  Service: Endoscopy;  Laterality: N/A;  . ESOPHAGOGASTRODUODENOSCOPY (EGD) WITH PROPOFOL N/A 09/19/2017   Procedure: ESOPHAGOGASTRODUODENOSCOPY (EGD) WITH PROPOFOL;  Surgeon: Virgel Manifold, MD;  Location: [facility] ENDOSCOPY;  Service: Endoscopy;  Laterality: N/A;  . ESOPHAGOGASTRODUODENOSCOPY (EGD) WITH PROPOFOL N/A 10/03/2017   Procedure: ESOPHAGOGASTRODUODENOSCOPY (EGD)  WITH PROPOFOL;  Surgeon: Virgel Manifold, MD;  Location: [facility] ENDOSCOPY;  Service: Endoscopy;  Laterality: N/A;  . ESOPHAGOGASTRODUODENOSCOPY (EGD) WITH PROPOFOL N/A 10/22/2017   Procedure: ESOPHAGOGASTRODUODENOSCOPY (EGD) WITH PROPOFOL;  Surgeon: Virgel Manifold, MD;  Location: [facility] ENDOSCOPY;  Service: Endoscopy;  Laterality: N/A;  . ESOPHAGOGASTRODUODENOSCOPY (EGD) WITH PROPOFOL N/A 11/18/2017   Procedure: ESOPHAGOGASTRODUODENOSCOPY (EGD) WITH PROPOFOL;  Surgeon: Virgel Manifold, MD;  Location: [facility] ENDOSCOPY;  Service: Endoscopy;  Laterality: N/A;  . ESOPHAGOGASTRODUODENOSCOPY (EGD) WITH PROPOFOL N/A 12/17/2017   Procedure: ESOPHAGOGASTRODUODENOSCOPY (EGD) WITH PROPOFOL;  Surgeon: Virgel Manifold, MD;  Location: [facility] ENDOSCOPY;  Service: Endoscopy;  Laterality: N/A;  . ESOPHAGOGASTRODUODENOSCOPY (EGD) WITH PROPOFOL N/A 01/08/2018   Procedure: ESOPHAGOGASTRODUODENOSCOPY (EGD) WITH PROPOFOL;  Surgeon: Virgel Manifold, MD;  Location: [facility] ENDOSCOPY;  Service: Endoscopy;  Laterality: N/A;  . ESOPHAGOGASTRODUODENOSCOPY (EGD) WITH PROPOFOL N/A 01/22/2018   Procedure: ESOPHAGOGASTRODUODENOSCOPY (EGD) WITH PROPOFOL;  Surgeon: Virgel Manifold, MD;  Location: [facility] ENDOSCOPY;  Service: Endoscopy;  Laterality: N/A;  . EYE SURGERY    . GASTRIC BYPASS    . HERNIA REPAIR    . HIATAL HERNIA REPAIR    . JOINT REPLACEMENT    . MELANOMA EXCISION    . PORTA CATH INSERTION N/A 04/07/2017   Procedure: PORTA CATH INSERTION;  Surgeon: Algernon Huxley, MD;  Location: [place] CV LAB;  Service: Cardiovascular;  Laterality: N/A;  . POSTERIOR CERVICAL FUSION/FORAMINOTOMY N/A 07/28/2013   Procedure: Cervical four-seven  posterior cervical fusion;  Surgeon: Erline Levine, MD;  Location: [place] [facility]UR[facility] ORS;  Service: Neurosurgery;  Laterality: N/A;  . TONSILLECTOMY    . TOTAL KNEE ARTHROPLASTY      Social History   Socioeconomic History  . Marital status: Widowed    Spouse name: Not on file  .  Number of children: 2  . Years of education: Not on file  . Highest education level: Master's degree (e.g., MA, MS, MEng, MEd, MSW, MBA)  Occupational History  . Not on file  Social Needs  . Financial resource strain: Not hard at all  . Food insecurity:    Worry: Never true    Inability: Never true  . Transportation needs:    Medical: No    Non-medical: No  Tobacco Use  . Smoking status: Former Smoker    Packs/day: 1.00    Years: 20.00    Pack years: 20.00    Types: Cigarettes    Last attempt to quit: 1977    Years since quitting: 42.9  . Smokeless tobacco: Never Used  Substance and Sexual Activity  . Alcohol use: No  . Drug use: Never  . Sexual activity: Not Currently  Lifestyle  . Physical activity:    Days per week: 2 days    Minutes per session: 30 min  . Stress: Not at all  Relationships  . Social connections:    Talks on phone: More than three times a week    Gets together: More than three times a week    Attends religious service: More than 4 times per year    Active member of club or organization: Yes    Attends meetings of clubs or organizations: More than 4 times per year    Relationship status: Widowed  . Intimate partner violence:    Fear of current or ex partner: No    Emotionally abused: No    Physically abused: No    Forced sexual activity: No  Other Topics Concern  . Not on file  Social History Narrative   Marital Status- Widowed    Lives by herself    Employement- Retired Pharmacist, hospital   Exercise hx- Does PT     Family History  Problem Relation Age of Onset  . Breast cancer Mother 54  . Arthritis-Osteo Mother   . Breast cancer Sister 34  . Bladder Cancer Sister   . Bladder Cancer Father   . Tongue cancer Father   . Congenital heart disease Father   . Prostate cancer Brother   . Uterine cancer Maternal Aunt   . Lung cancer Paternal Uncle   . Leukemia Maternal Grandmother   . Liver cancer Paternal Grandmother   . Colon cancer Neg Hx       Current Outpatient Medications:  .  albuterol (PROVENTIL HFA;VENTOLIN HFA) 108 (90 Base) MCG/ACT inhaler, Inhale 2 puffs into the lungs every 4 (four) hours as needed for wheezing or shortness of breath., Disp: , Rfl:  .  calcium carbonate (TUMS EX) 750 MG chewable tablet, Chew 1 tablet by mouth as needed for heartburn. , Disp: , Rfl:  .  chlorhexidine (PERIDEX) 0.12 % solution, Use as directed 15 mLs in the mouth or throat 2 (two) times daily., Disp: , Rfl:  .  Cholecalciferol (VITAMIN D) 2000 UNITS CAPS, Take 2,000 Units by mouth daily., Disp: , Rfl:  .  cyanocobalamin 500 MCG  tablet, Take 500 mcg every other day by mouth., Disp: , Rfl:  .  Diclofenac Sodium 1 % CREA, Apply to left arm as needed for pain three times daily, Disp: 120 g, Rfl: 0 .  dronabinol (MARINOL) 5 MG capsule, Take 1 capsule (5 mg total) by mouth 2 (two) times daily before a meal., Disp: 60 capsule, Rfl: 0 .  fluticasone (FLONASE) 50 MCG/ACT nasal spray, Place 1-2 sprays into both nostrils daily., Disp: 16 g, Rfl: 5 .  furosemide (LASIX) 20 MG tablet, Take 1 tablet (20 mg total) by mouth daily., Disp: 90 tablet, Rfl: 3 .  hydrocortisone cream 1 %, Apply 1 application topically daily as needed for itching. , Disp: , Rfl:  .  loratadine (CLARITIN) 10 MG tablet, Take 10 mg by mouth daily., Disp: , Rfl:  .  metoprolol succinate (TOPROL-XL) 50 MG 24 hr tablet, TAKE 1 TABLET BY MOUTH DAILY, Disp: 90 tablet, Rfl: 1 .  mirabegron ER (MYRBETRIQ) 25 MG TB24 tablet, Take 1 tablet (25 mg total) by mouth daily., Disp: 30 tablet, Rfl: 2 .  Multiple Vitamins-Minerals (MULTIVITAMIN ADULT) CHEW, Chew 1 tablet by mouth daily. , Disp: , Rfl:  .  pantoprazole (PROTONIX) 40 MG tablet, Take 1 tablet (40 mg total) by mouth 2 (two) times daily before a meal., Disp: , Rfl:  .  Polyethyl Glycol-Propyl Glycol (SYSTANE OP), Apply 1 drop to eye daily as needed (dry eyes)., Disp: , Rfl:  .  potassium chloride (KLOR-CON) 20 MEQ packet, Take 20 mEq by  mouth daily., Disp: 30 packet, Rfl: 1 .  pregabalin (LYRICA) 150 MG capsule, Take 1 capsule (150 mg total) by mouth 2 (two) times daily., Disp: , Rfl:  .  pyridOXINE (VITAMIN B-6) 100 MG tablet, Take 1 tablet (100 mg total) by mouth daily., Disp: 30 tablet, Rfl: 3 .  senna-docusate (SENOKOT-S) 8.6-50 MG tablet, Take 1 tablet at bedtime as needed by mouth for mild constipation., Disp: , Rfl:  .  sucralfate (CARAFATE) 1 GM/10ML suspension, Take 10 mLs (1 g total) by mouth 4 (four) times daily., Disp: , Rfl:  .  traMADol (ULTRAM) 50 MG tablet, Take 1 tablet (50 mg total) by mouth every 6 (six) hours as needed., Disp: 30 tablet, Rfl: 1 .  traZODone (DESYREL) 50 MG tablet, Take 0.5 tablets (25 mg total) by mouth at bedtime as needed for sleep., Disp: , Rfl:  No current facility-administered medications for this visit.   Facility-Administered Medications Ordered in Other Visits:  .  sodium chloride flush (NS) 0.9 % injection 10 mL, 10 mL, Intravenous, PRN, Earlie Server, MD, 10 mL at 01/26/18 0904  Physical exam:  Vitals:   04/27/18 0923  BP: 125/79  Pulse: 71  Temp: (!) 97 F (36.1 C)  TempSrc: Tympanic  Weight: 199 lb 6.4 oz (90.4 kg)  ECOG 1 Physical Exam  Constitutional: She is oriented to person, place, and time. No distress.  HENT:  Head: Normocephalic and atraumatic.  Nose: Nose normal.  Mouth/Throat: Oropharynx is clear and moist. No oropharyngeal exudate.  Eyes: Pupils are equal, round, and reactive to light. Conjunctivae and EOM are normal. No scleral icterus.  Neck: Normal range of motion. Neck supple. No JVD present.  Cardiovascular: Normal rate and regular rhythm.  Murmur heard. Pulmonary/Chest: Effort normal and breath sounds normal. No respiratory distress. She has no wheezes. She has no rales. She exhibits no tenderness.  Abdominal: Soft. Bowel sounds are normal. She exhibits no distension. There is no abdominal tenderness.  Musculoskeletal: Normal range of motion.         General: No tenderness, deformity or edema.  Lymphadenopathy:    She has no cervical adenopathy.  Neurological: She is alert and oriented to person, place, and time. No cranial nerve deficit. She exhibits normal muscle tone. Coordination normal.  Skin: Skin is warm and dry. She is not diaphoretic. No erythema.  Psychiatric: Affect and judgment normal.    CMP Latest Ref Rng & Units 04/27/2018  Glucose 70 - 99 mg/dL 87  BUN 8 - 23 mg/dL 12  Creatinine 0.44 - 1.00 mg/dL 0.78  Sodium 135 - 145 mmol/L 142  Potassium 3.5 - 5.1 mmol/L 4.0  Chloride 98 - 111 mmol/L 106  CO2 22 - 32 mmol/L 28  Calcium 8.9 - 10.3 mg/dL 8.7(L)  Total Protein 6.5 - 8.1 g/dL 6.8  Total Bilirubin 0.3 - 1.2 mg/dL 0.4  Alkaline Phos 38 - 126 U/L 78  AST 15 - 41 U/L 25  ALT 0 - 44 U/L 16   CBC Latest Ref Rng & Units 04/27/2018  WBC 4.0 - 10.5 K/uL 7.4  Hemoglobin 12.0 - 15.0 g/dL 11.7(L)  Hematocrit 36.0 - 46.0 % 38.4  Platelets 150 - 400 K/uL 240    RADIOGRAPHIC STUDIES: I have personally reviewed the radiological images as listed and agreed with the findings in the report.  PET scan 01/23/2018  Interval development of loculated ascites within the upper abdomen which overlies the liver and spleen and exhibits increased radiotracer uptake. Findings worrisome for peritoneal carcinomatosis. Consider further evaluation with diagnostic paracentesis. 2. Soft tissue infiltration within the omentum with increased radiotracer uptake noted. Also worrisome for peritoneal carcinomatosis. 3. New hypermetabolic internal mammary lymph nodes and right CP angle lymph node. Suspicious for metastatic adenopathy. 4. Mild FDG uptake is identified localizing to the lower third of the esophagus within SUV max of 5.15.  Assessment and plan  Cancer Staging Malignant neoplasm of lower third of esophagus [facility]) Staging form: Esophagus - Adenocarcinoma, AJCC 8th Edition - Clinical stage from 04/10/2017: Stage IVB (cTX, cNX, pM1) -  Signed by Earlie Server, MD on 04/10/2017  1. Malignant neoplasm of lower third of esophagus (HCC)   2. Left hip pain   3. Neuropathy   4. Encounter for antineoplastic chemotherapy   5. Abdominal pain, unspecified abdominal location    #Esophageal cancer, currently on second line treatment with Cyramza and paclitaxel.   Labs are reviewed and discussed with patient. Counts are acceptable to proceed with chemotherapy.  She is quite fatigued, I will reduce Taxol to 55m/m2.   Plan to obtain CT chest abdominal pelvis to access treatment response after this cycle.   # Left hip pain, obtain bone scan.  # Abdominal pain,likely neoplasm related pain: advise patient to use Tramadol 567mQ6 hours as needed.  #  Chronic neuropathy: Continue Lyrica.  stable symptoms.    Follow-up 1 week for Taxol treatments. Follow up in 2 weeks for Day 15 Taxol and Cyramza.  Total face to face encounter time for this patient visit was 25 min. >50% of the time was  spent in counseling and coordination of care.   ZhEarlie ServerMD, PhD Hematology Oncology [facility]- [phone]/16/19

## 2018-04-28 LAB — CEA: CEA1: 2.4 ng/mL (ref 0.0–4.7)

## 2018-04-28 MED ORDER — PREDNISONE & DIPHENHYDRAMINE 3 X 50 MG & 1 X 50 MG PO KIT: 50.0000 mg | PACK | ORAL | 0 refills | Status: DC

## 2018-04-28 NOTE — Addendum Note (Signed)
Addended by: Earlie Server on: 04/28/2018 12:21 PM   Modules accepted: Orders

## 2018-05-04 ENCOUNTER — Inpatient Hospital Stay: Payer: Medicare Other

## 2018-05-04 VITALS — BP 117/78 | HR 61 | Temp 95.1°F | Wt 197.0 lb

## 2018-05-04 DIAGNOSIS — Z5111 Encounter for antineoplastic chemotherapy: Secondary | ICD-10-CM

## 2018-05-04 DIAGNOSIS — C155 Malignant neoplasm of lower third of esophagus: Secondary | ICD-10-CM | POA: Diagnosis not present

## 2018-05-04 LAB — COMPREHENSIVE METABOLIC PANEL
ALT: 14 U/L (ref 0–44)
AST: 28 U/L (ref 15–41)
Albumin: 3.2 g/dL — ABNORMAL LOW (ref 3.5–5.0)
Alkaline Phosphatase: 66 U/L (ref 38–126)
Anion gap: 8 (ref 5–15)
BILIRUBIN TOTAL: 0.6 mg/dL (ref 0.3–1.2)
BUN: 10 mg/dL (ref 8–23)
CALCIUM: 8.5 mg/dL — AB (ref 8.9–10.3)
CO2: 29 mmol/L (ref 22–32)
CREATININE: 0.68 mg/dL (ref 0.44–1.00)
Chloride: 106 mmol/L (ref 98–111)
GFR calc Af Amer: >60 mL/min (ref >60)
GFR calc non Af Amer: >60 mL/min (ref >60)
Glucose, Bld: 101 mg/dL — ABNORMAL HIGH (ref 70–99)
Potassium: 3.8 mmol/L (ref 3.5–5.1)
Sodium: 143 mmol/L (ref 135–145)
Total Protein: 6.8 g/dL (ref 6.5–8.1)

## 2018-05-04 LAB — CBC WITH DIFFERENTIAL/PLATELET
Abs Immature Granulocytes: 0.04 10*3/uL (ref 0.00–0.07)
Basophils Absolute: 0 10*3/uL (ref 0.0–0.1)
Basophils Relative: 1 %
Eosinophils Absolute: 0.2 10*3/uL (ref 0.0–0.5)
Eosinophils Relative: 3 %
HCT: 35.6 % — ABNORMAL LOW (ref 36.0–46.0)
HEMOGLOBIN: 11.2 g/dL — AB (ref 12.0–15.0)
Immature Granulocytes: 1 %
LYMPHS PCT: 12 %
Lymphs Abs: 0.7 10*3/uL (ref 0.7–4.0)
MCH: 32.2 pg (ref 26.0–34.0)
MCHC: 31.5 g/dL (ref 30.0–36.0)
MCV: 102.3 fL — ABNORMAL HIGH (ref 80.0–100.0)
Monocytes Absolute: 0.4 10*3/uL (ref 0.1–1.0)
Monocytes Relative: 6 %
Neutro Abs: 4.4 10*3/uL (ref 1.7–7.7)
Neutrophils Relative %: 77 %
Platelets: 197 10*3/uL (ref 150–400)
RBC: 3.48 MIL/uL — AB (ref 3.87–5.11)
RDW: 15.1 % (ref 11.5–15.5)
WBC: 5.7 10*3/uL (ref 4.0–10.5)
nRBC: 0 % (ref 0.0–0.2)

## 2018-05-04 LAB — URINALYSIS, DIPSTICK ONLY
Bilirubin Urine: NEGATIVE
Glucose, UA: NEGATIVE mg/dL
Ketones, ur: NEGATIVE mg/dL
Nitrite: POSITIVE — AB
Protein, ur: 30 mg/dL — AB
Specific Gravity, Urine: 1.014 (ref 1.005–1.030)
pH: 5 (ref 5.0–8.0)

## 2018-05-04 MED ORDER — SODIUM CHLORIDE 0.9 % IV SOLN
70.0000 mg/m2 | Freq: Once | INTRAVENOUS | Status: AC
  Administered 2018-05-04: 138 mg via INTRAVENOUS
  Filled 2018-05-04: qty 23

## 2018-05-04 MED ORDER — DIPHENHYDRAMINE HCL 50 MG/ML IJ SOLN
50.0000 mg | Freq: Once | INTRAMUSCULAR | Status: AC
  Administered 2018-05-04: 50 mg via INTRAVENOUS
  Filled 2018-05-04: qty 1

## 2018-05-04 MED ORDER — SODIUM CHLORIDE 0.9 % IV SOLN: 80.0000 mg/m2 | Freq: Once | INTRAVENOUS | Status: DC

## 2018-05-04 MED ORDER — SODIUM CHLORIDE 0.9 % IV SOLN
Freq: Once | INTRAVENOUS | Status: AC
  Administered 2018-05-04: 09:00:00 via INTRAVENOUS
  Filled 2018-05-04: qty 250

## 2018-05-04 MED ORDER — SODIUM CHLORIDE 0.9% FLUSH
10.0000 mL | INTRAVENOUS | Status: DC | PRN
  Filled 2018-05-04: qty 10

## 2018-05-04 MED ORDER — DEXAMETHASONE SODIUM PHOSPHATE 10 MG/ML IJ SOLN
10.0000 mg | Freq: Once | INTRAMUSCULAR | Status: AC
  Administered 2018-05-04: 10 mg via INTRAVENOUS
  Filled 2018-05-04: qty 1

## 2018-05-04 MED ORDER — FAMOTIDINE IN NACL 20-0.9 MG/50ML-% IV SOLN
20.0000 mg | Freq: Once | INTRAVENOUS | Status: AC
  Administered 2018-05-04: 20 mg via INTRAVENOUS
  Filled 2018-05-04: qty 50

## 2018-05-04 MED ORDER — HEPARIN SOD (PORK) LOCK FLUSH 100 UNIT/ML IV SOLN
500.0000 [IU] | Freq: Once | INTRAVENOUS | Status: AC | PRN
  Administered 2018-05-04: 500 [IU]
  Filled 2018-05-04: qty 5

## 2018-05-04 NOTE — Progress Notes (Signed)
Dose last time was 70mg /m2.  Todays dose was 80mg /m2, contacted MD to make sure what dose was correct.  Change to 70mg /m2 per MD

## 2018-05-05 LAB — CEA: CEA: 2.1 ng/mL (ref 0.0–4.7)

## 2018-05-07 ENCOUNTER — Ambulatory Visit: Admission: RE | Admit: 2018-05-07 | Payer: Medicare Other | Source: Ambulatory Visit

## 2018-05-08 ENCOUNTER — Encounter
Admission: RE | Admit: 2018-05-08 | Discharge: 2018-05-08 | Disposition: A | Discharge status: Home / Self Care | Payer: Medicare Other | Source: Ambulatory Visit | Attending: Oncology | Admitting: Oncology

## 2018-05-08 DIAGNOSIS — C155 Malignant neoplasm of lower third of esophagus: Secondary | ICD-10-CM

## 2018-05-08 DIAGNOSIS — M25552 Pain in left hip: Secondary | ICD-10-CM | POA: Diagnosis present

## 2018-05-08 MED ORDER — TECHNETIUM TC 99M MEDRONATE IV KIT
22.0000 | PACK | Freq: Once | INTRAVENOUS | Status: AC | PRN
  Administered 2018-05-08: 22.702 via INTRAVENOUS

## 2018-05-11 ENCOUNTER — Inpatient Hospital Stay (HOSPITAL_BASED_OUTPATIENT_CLINIC_OR_DEPARTMENT_OTHER): Payer: Medicare Other | Admitting: Oncology

## 2018-05-11 ENCOUNTER — Inpatient Hospital Stay: Payer: Medicare Other

## 2018-05-11 ENCOUNTER — Other Ambulatory Visit: Payer: Self-pay

## 2018-05-11 ENCOUNTER — Encounter: Payer: Self-pay | Admitting: Oncology

## 2018-05-11 VITALS — BP 117/84 | HR 71 | Temp 98.6°F | Resp 18 | Wt 198.5 lb

## 2018-05-11 DIAGNOSIS — C155 Malignant neoplasm of lower third of esophagus: Secondary | ICD-10-CM | POA: Diagnosis not present

## 2018-05-11 DIAGNOSIS — G629 Polyneuropathy, unspecified: Secondary | ICD-10-CM

## 2018-05-11 DIAGNOSIS — C7951 Secondary malignant neoplasm of bone: Secondary | ICD-10-CM

## 2018-05-11 DIAGNOSIS — Z79899 Other long term (current) drug therapy: Secondary | ICD-10-CM

## 2018-05-11 DIAGNOSIS — Z9884 Bariatric surgery status: Secondary | ICD-10-CM

## 2018-05-11 DIAGNOSIS — I10 Essential (primary) hypertension: Secondary | ICD-10-CM

## 2018-05-11 DIAGNOSIS — Z87440 Personal history of urinary (tract) infections: Secondary | ICD-10-CM

## 2018-05-11 DIAGNOSIS — Z923 Personal history of irradiation: Secondary | ICD-10-CM

## 2018-05-11 DIAGNOSIS — C787 Secondary malignant neoplasm of liver and intrahepatic bile duct: Secondary | ICD-10-CM | POA: Diagnosis not present

## 2018-05-11 DIAGNOSIS — Z9989 Dependence on other enabling machines and devices: Secondary | ICD-10-CM

## 2018-05-11 DIAGNOSIS — N3000 Acute cystitis without hematuria: Secondary | ICD-10-CM

## 2018-05-11 DIAGNOSIS — R590 Localized enlarged lymph nodes: Secondary | ICD-10-CM

## 2018-05-11 DIAGNOSIS — Z5111 Encounter for antineoplastic chemotherapy: Secondary | ICD-10-CM

## 2018-05-11 DIAGNOSIS — G4733 Obstructive sleep apnea (adult) (pediatric): Secondary | ICD-10-CM

## 2018-05-11 DIAGNOSIS — J449 Chronic obstructive pulmonary disease, unspecified: Secondary | ICD-10-CM

## 2018-05-11 DIAGNOSIS — Z85820 Personal history of malignant melanoma of skin: Secondary | ICD-10-CM

## 2018-05-11 DIAGNOSIS — M1990 Unspecified osteoarthritis, unspecified site: Secondary | ICD-10-CM

## 2018-05-11 DIAGNOSIS — R5382 Chronic fatigue, unspecified: Secondary | ICD-10-CM

## 2018-05-11 DIAGNOSIS — Z87891 Personal history of nicotine dependence: Secondary | ICD-10-CM

## 2018-05-11 DIAGNOSIS — M25552 Pain in left hip: Secondary | ICD-10-CM

## 2018-05-11 DIAGNOSIS — K219 Gastro-esophageal reflux disease without esophagitis: Secondary | ICD-10-CM

## 2018-05-11 LAB — COMPREHENSIVE METABOLIC PANEL
ALT: 11 U/L (ref 0–44)
AST: 24 U/L (ref 15–41)
Albumin: 3 g/dL — ABNORMAL LOW (ref 3.5–5.0)
Alkaline Phosphatase: 60 U/L (ref 38–126)
Anion gap: 10 (ref 5–15)
BUN: 13 mg/dL (ref 8–23)
CO2: 28 mmol/L (ref 22–32)
Calcium: 8.5 mg/dL — ABNORMAL LOW (ref 8.9–10.3)
Chloride: 101 mmol/L (ref 98–111)
Creatinine, Ser: 0.98 mg/dL (ref 0.44–1.00)
GFR calc Af Amer: >60 mL/min (ref >60)
GFR calc non Af Amer: 54 mL/min — ABNORMAL LOW (ref >60)
Glucose, Bld: 105 mg/dL — ABNORMAL HIGH (ref 70–99)
POTASSIUM: 4.2 mmol/L (ref 3.5–5.1)
Sodium: 139 mmol/L (ref 135–145)
Total Bilirubin: 0.6 mg/dL (ref 0.3–1.2)
Total Protein: 6.6 g/dL (ref 6.5–8.1)

## 2018-05-11 LAB — URINALYSIS, DIPSTICK ONLY
Bilirubin Urine: NEGATIVE
Glucose, UA: NEGATIVE mg/dL
Ketones, ur: NEGATIVE mg/dL
Nitrite: NEGATIVE
Protein, ur: NEGATIVE mg/dL
Specific Gravity, Urine: 1.004 — ABNORMAL LOW (ref 1.005–1.030)
pH: 7 (ref 5.0–8.0)

## 2018-05-11 LAB — CBC WITH DIFFERENTIAL/PLATELET
Abs Immature Granulocytes: 0.03 10*3/uL (ref 0.00–0.07)
BASOS ABS: 0 10*3/uL (ref 0.0–0.1)
Basophils Relative: 1 %
Eosinophils Absolute: 0.1 10*3/uL (ref 0.0–0.5)
Eosinophils Relative: 3 %
HCT: 33.3 % — ABNORMAL LOW (ref 36.0–46.0)
Hemoglobin: 10.6 g/dL — ABNORMAL LOW (ref 12.0–15.0)
Immature Granulocytes: 1 %
Lymphocytes Relative: 13 %
Lymphs Abs: 0.4 10*3/uL — ABNORMAL LOW (ref 0.7–4.0)
MCH: 32.6 pg (ref 26.0–34.0)
MCHC: 31.8 g/dL (ref 30.0–36.0)
MCV: 102.5 fL — ABNORMAL HIGH (ref 80.0–100.0)
Monocytes Absolute: 0.4 10*3/uL (ref 0.1–1.0)
Monocytes Relative: 14 %
NEUTROS ABS: 2.1 10*3/uL (ref 1.7–7.7)
Neutrophils Relative %: 68 %
Platelets: 229 10*3/uL (ref 150–400)
RBC: 3.25 MIL/uL — ABNORMAL LOW (ref 3.87–5.11)
RDW: 15.7 % — ABNORMAL HIGH (ref 11.5–15.5)
WBC: 3.1 10*3/uL — ABNORMAL LOW (ref 4.0–10.5)
nRBC: 0 % (ref 0.0–0.2)

## 2018-05-11 MED ORDER — DEXAMETHASONE SODIUM PHOSPHATE 10 MG/ML IJ SOLN
10.0000 mg | Freq: Once | INTRAMUSCULAR | Status: AC
  Administered 2018-05-11: 10 mg via INTRAVENOUS
  Filled 2018-05-11: qty 1

## 2018-05-11 MED ORDER — SODIUM CHLORIDE 0.9 % IV SOLN
Freq: Once | INTRAVENOUS | Status: AC
  Administered 2018-05-11: 10:00:00 via INTRAVENOUS
  Filled 2018-05-11: qty 250

## 2018-05-11 MED ORDER — FAMOTIDINE IN NACL 20-0.9 MG/50ML-% IV SOLN
20.0000 mg | Freq: Once | INTRAVENOUS | Status: AC
  Administered 2018-05-11: 20 mg via INTRAVENOUS
  Filled 2018-05-11: qty 50

## 2018-05-11 MED ORDER — ACETAMINOPHEN 325 MG PO TABS
650.0000 mg | ORAL_TABLET | Freq: Once | ORAL | Status: AC
  Administered 2018-05-11: 650 mg via ORAL
  Filled 2018-05-11: qty 2

## 2018-05-11 MED ORDER — HEPARIN SOD (PORK) LOCK FLUSH 100 UNIT/ML IV SOLN
500.0000 [IU] | Freq: Once | INTRAVENOUS | Status: AC | PRN
  Administered 2018-05-11: 500 [IU]
  Filled 2018-05-11: qty 5

## 2018-05-11 MED ORDER — SODIUM CHLORIDE 0.9 % IV SOLN
70.0000 mg/m2 | Freq: Once | INTRAVENOUS | Status: AC
  Administered 2018-05-11: 138 mg via INTRAVENOUS
  Filled 2018-05-11: qty 23

## 2018-05-11 MED ORDER — DIPHENHYDRAMINE HCL 50 MG/ML IJ SOLN
50.0000 mg | Freq: Once | INTRAMUSCULAR | Status: AC
  Administered 2018-05-11: 50 mg via INTRAVENOUS
  Filled 2018-05-11: qty 1

## 2018-05-11 MED ORDER — SODIUM CHLORIDE 0.9 % IV SOLN
8.0000 mg/kg | Freq: Once | INTRAVENOUS | Status: AC
  Administered 2018-05-11: 700 mg via INTRAVENOUS
  Filled 2018-05-11: qty 70

## 2018-05-11 NOTE — Progress Notes (Signed)
Hematology/Oncology Follow Up Note [facility]  [phone] [phone]  Patient Care Team: Tonia Ghent, MD as PCP - General (Family Medicine)   Name of the patient: [name]  [id number]  [birth year]   REASON FOR VISIT Follow up of chemotherapy management of esophageal adenocarcinoma  HISTORY OF PRESENT ILLNESS Patient is a 83 year old female who has metastatic esophageal cancer with liver involvement.  Weight loss 20 pounds for the past year prior to diagnosis.  # EGD during admission showed partially obstructive esophageal mass at the distal part of esophagus. Biopsy was taken. Pathology showed esophageal adenocarcinoma. #US biopsy of liver lesion to proof distant metastasis.  Report family history of breast cancer, but she has been having routine mammograms.  # Lives alone. She's a former smoker.  # Molecular Testing:  #[employer] Cdx: No reportable alternations with companion diagnostic (CDx) claims.  MS stable Tumor mutation burden: Cannot be determined, CCND3 amplification: Equivocal.  Amended reports on 06/04/2017, Tumor mutational burden changed from can not be determined to "TMB -low" on the re-analyzed samples.  # HER 2 negative.   Current Treatment S/p 6 cycles of FOLFOX, PET scan showed excellent partial response from both chemotherapy and radiation, result was discussed with patient.  currently on 5-FU maintenance.  S/p palliative radiation in January 2019 # Developed esophageal stricture secondary to radiation in April and got dilation via endoscopy. EGD shoed Benign-appearing esophageal stenosis. This is a result of radiation therapy.- LA Grade C radiation esophagitis. - The previously noted esophageal mass in Nov 2018 was much decreased indicating good response to radiation/chemotherapy. - Normal stomach.- Normal examined jejunum.- No specimens collected.    INTERVAL HISTORY  Patient with above history presents  for assessment prior to second line chemotherapy for treatment of metastatic esophageal cancer.  Accompanied by sister and son.  #Patient reports having fever during the weekend, up to 101.  Patient's family members want to send patient to ER for further evaluation.  Patient not willing to go.  She took antibiotics Bactrim DS for 3 days.  No fever today. Denies any dysuria, increased frequency or urgency.  Denies any cough.  #Fatigue is at baseline. #Left flank pain has resolved. During interval she has had bone scan as evaluation for worsening of left hip pain.  Present to discuss results. # Chronic numbness,  unchanged.  Takes Lyrica.   Review of Systems  Constitutional: Positive for fatigue and fever. Negative for chills and unexpected weight change.  HENT:   Negative for hearing loss and voice change.        Chronic sinus congestion.   Eyes: Negative for eye problems.  Respiratory: Negative for chest tightness and cough.   Cardiovascular: Negative for chest pain.  Gastrointestinal: Negative for abdominal distention, abdominal pain and blood in stool.  Endocrine: Negative for hot flashes.  Genitourinary: Negative for difficulty urinating and frequency.   Musculoskeletal: Negative for arthralgias and flank pain.  Skin: Negative for itching and rash.  Neurological: Positive for numbness. Negative for extremity weakness.  Hematological: Negative for adenopathy.  Psychiatric/Behavioral: Negative for confusion.     Allergies  Allergen Reactions  . Cefuroxime Nausea Only  . Codeine Nausea Only  . Doxycycline Other (See Comments)    Lip swelling  . Gabapentin Other (See Comments)    Swelling.   . Iohexol Itching    07-15-13 pt developed itching on fingers after contrast given. Dr. Irish Elders looked at pt and said to put in system as allergy.  BB  . Other Other (See Comments)    Contrast Dye- caused fever, chills, awful feeling Bandages - itching, rash  . Oxycodone Other (See Comments)     nausea  . Quinolones Swelling    Lip swelling  . Synvisc [Hylan G-F 20] Swelling     Past Medical History:  Diagnosis Date  . Arthritis   . Asthma   . COPD (chronic obstructive pulmonary disease) ([place])   . Dysphonia   . Dyspnea   . Esophageal cancer ([place])   . GERD (gastroesophageal reflux disease)   . Heart murmur   . History of kidney stones   . Hypertension   . Hypokalemia 10/07/2017  . Melanoma ([place])   . OAB (overactive bladder)   . OSA on CPAP   . 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 Personal history of chemotherapy    esophageal cancer  . Personal history of radiation therapy    esophageal cancer  . Pupil asymmetry    R pupil defect  . Skin cancer   . Sleep apnea   . Spinal cord abscess      Past Surgical History:  Procedure Laterality Date  . ABDOMINAL HYSTERECTOMY    . ANTERIOR CERVICAL DECOMP/DISCECTOMY FUSION N/A 07/16/2013   Procedure: ANTERIOR CERVICAL DECOMPRESSION/DISCECTOMY FUSION mutlipleLEVELS C4-7;  Surgeon: Erline Levine, MD;  Location: [place] [facility];  Service: Neurosurgery;  Laterality: N/A;  . APPENDECTOMY    . carpel tunn Bilateral   . CATARACT EXTRACTION    . CATARACT EXTRACTION, BILATERAL    . CHOLECYSTECTOMY    . dental implant    . ESOPHAGOGASTRODUODENOSCOPY Left 03/21/2017   Procedure: ESOPHAGOGASTRODUODENOSCOPY (EGD);  Surgeon: Virgel Manifold, MD;  Location: [facility];  Service: Endoscopy;  Laterality: Left;  . ESOPHAGOGASTRODUODENOSCOPY (EGD) WITH PROPOFOL N/A 08/20/2017   Procedure: ESOPHAGOGASTRODUODENOSCOPY (EGD) WITH PROPOFOL;  Surgeon: Virgel Manifold, MD;  Location: [facility] ENDOSCOPY;  Service: Endoscopy;  Laterality: N/A;  . ESOPHAGOGASTRODUODENOSCOPY (EGD) WITH PROPOFOL N/A 09/19/2017   Procedure: ESOPHAGOGASTRODUODENOSCOPY (EGD) WITH PROPOFOL;  Surgeon: Virgel Manifold, MD;  Location: [facility] ENDOSCOPY;  Service: Endoscopy;  Laterality: N/A;  . ESOPHAGOGASTRODUODENOSCOPY (EGD) WITH PROPOFOL N/A  10/03/2017   Procedure: ESOPHAGOGASTRODUODENOSCOPY (EGD) WITH PROPOFOL;  Surgeon: Virgel Manifold, MD;  Location: [facility] ENDOSCOPY;  Service: Endoscopy;  Laterality: N/A;  . ESOPHAGOGASTRODUODENOSCOPY (EGD) WITH PROPOFOL N/A 10/22/2017   Procedure: ESOPHAGOGASTRODUODENOSCOPY (EGD) WITH PROPOFOL;  Surgeon: Virgel Manifold, MD;  Location: [facility] ENDOSCOPY;  Service: Endoscopy;  Laterality: N/A;  . ESOPHAGOGASTRODUODENOSCOPY (EGD) WITH PROPOFOL N/A 11/18/2017   Procedure: ESOPHAGOGASTRODUODENOSCOPY (EGD) WITH PROPOFOL;  Surgeon: Virgel Manifold, MD;  Location: [facility] ENDOSCOPY;  Service: Endoscopy;  Laterality: N/A;  . ESOPHAGOGASTRODUODENOSCOPY (EGD) WITH PROPOFOL N/A 12/17/2017   Procedure: ESOPHAGOGASTRODUODENOSCOPY (EGD) WITH PROPOFOL;  Surgeon: Virgel Manifold, MD;  Location: [facility] ENDOSCOPY;  Service: Endoscopy;  Laterality: N/A;  . ESOPHAGOGASTRODUODENOSCOPY (EGD) WITH PROPOFOL N/A 01/08/2018   Procedure: ESOPHAGOGASTRODUODENOSCOPY (EGD) WITH PROPOFOL;  Surgeon: Virgel Manifold, MD;  Location: [facility] ENDOSCOPY;  Service: Endoscopy;  Laterality: N/A;  . ESOPHAGOGASTRODUODENOSCOPY (EGD) WITH PROPOFOL N/A 01/22/2018   Procedure: ESOPHAGOGASTRODUODENOSCOPY (EGD) WITH PROPOFOL;  Surgeon: Virgel Manifold, MD;  Location: [facility] ENDOSCOPY;  Service: Endoscopy;  Laterality: N/A;  . EYE SURGERY    . GASTRIC BYPASS    . HERNIA REPAIR    . HIATAL HERNIA REPAIR    . JOINT REPLACEMENT    . MELANOMA EXCISION    . PORTA CATH INSERTION N/A 04/07/2017  Procedure: PORTA CATH INSERTION;  Surgeon: Algernon Huxley, MD;  Location: [facility];  Service: Cardiovascular;  Laterality: N/A;  . POSTERIOR CERVICAL FUSION/FORAMINOTOMY N/A 07/28/2013   Procedure: Cervical four-seven  posterior cervical fusion;  Surgeon: Erline Levine, MD;  Location: [place] [facility];  Service: Neurosurgery;  Laterality: N/A;  . TONSILLECTOMY    . TOTAL KNEE ARTHROPLASTY      Social History   Socioeconomic History  .  Marital status: Widowed    Spouse name: Not on file  . Number of children: 2  . Years of education: Not on file  . Highest education level: Master's degree (e.g., MA, MS, MEng, MEd, MSW, MBA)  Occupational History  . Not on file  Social Needs  . Financial resource strain: Not hard at all  . Food insecurity:    Worry: Never true    Inability: Never true  . Transportation needs:    Medical: No    Non-medical: No  Tobacco Use  . Smoking status: Former Smoker    Packs/day: 1.00    Years: 20.00    Pack years: 20.00    Types: Cigarettes    Last attempt to quit: 1977    Years since quitting: 43.0  . Smokeless tobacco: Never Used  Substance and Sexual Activity  . Alcohol use: No  . Drug use: Never  . Sexual activity: Not Currently  Lifestyle  . Physical activity:    Days per week: 2 days    Minutes per session: 30 min  . Stress: Not at all  Relationships  . Social connections:    Talks on phone: More than three times a week    Gets together: More than three times a week    Attends religious service: More than 4 times per year    Active member of club or organization: Yes    Attends meetings of clubs or organizations: More than 4 times per year    Relationship status: Widowed  . Intimate partner violence:    Fear of current or ex partner: No    Emotionally abused: No    Physically abused: No    Forced sexual activity: No  Other Topics Concern  . Not on file  Social History Narrative   Marital Status- Widowed    Lives by herself    Employement- Retired Pharmacist, hospital   Exercise hx- Does PT     Family History  Problem Relation Age of Onset  . Breast cancer Mother 70  . Arthritis-Osteo Mother   . Breast cancer Sister 61  . Bladder Cancer Sister   . Bladder Cancer Father   . Tongue cancer Father   . Congenital heart disease Father   . Prostate cancer Brother   . Uterine cancer Maternal Aunt   . Lung cancer Paternal Uncle   . Leukemia Maternal Grandmother   . Liver  cancer Paternal Grandmother   . Colon cancer Neg Hx      Current Outpatient Medications:  .  albuterol (PROVENTIL HFA;VENTOLIN HFA) 108 (90 Base) MCG/ACT inhaler, Inhale 2 puffs into the lungs every 4 (four) hours as needed for wheezing or shortness of breath., Disp: , Rfl:  .  calcium carbonate (TUMS EX) 750 MG chewable tablet, Chew 1 tablet by mouth as needed for heartburn. , Disp: , Rfl:  .  chlorhexidine (PERIDEX) 0.12 % solution, Use as directed 15 mLs in the mouth or throat 2 (two) times daily., Disp: , Rfl:  .  Cholecalciferol (VITAMIN D) 2000  UNITS CAPS, Take 2,000 Units by mouth daily., Disp: , Rfl:  .  cyanocobalamin 500 MCG tablet, Take 500 mcg every other day by mouth., Disp: , Rfl:  .  dronabinol (MARINOL) 5 MG capsule, Take 1 capsule (5 mg total) by mouth 2 (two) times daily before a meal., Disp: 60 capsule, Rfl: 0 .  fluticasone (FLONASE) 50 MCG/ACT nasal spray, Place 1-2 sprays into both nostrils daily., Disp: 16 g, Rfl: 5 .  furosemide (LASIX) 20 MG tablet, Take 1 tablet (20 mg total) by mouth daily., Disp: 90 tablet, Rfl: 3 .  hydrocortisone cream 1 %, Apply 1 application topically daily as needed for itching. , Disp: , Rfl:  .  loratadine (CLARITIN) 10 MG tablet, Take 10 mg by mouth daily., Disp: , Rfl:  .  metoprolol succinate (TOPROL-XL) 50 MG 24 hr tablet, TAKE 1 TABLET BY MOUTH DAILY, Disp: 90 tablet, Rfl: 1 .  mirabegron ER (MYRBETRIQ) 25 MG TB24 tablet, Take 1 tablet (25 mg total) by mouth daily., Disp: 30 tablet, Rfl: 2 .  Multiple Vitamins-Minerals (MULTIVITAMIN ADULT) CHEW, Chew 1 tablet by mouth daily. , Disp: , Rfl:  .  pantoprazole (PROTONIX) 40 MG tablet, Take 1 tablet (40 mg total) by mouth 2 (two) times daily before a meal., Disp: , Rfl:  .  Polyethyl Glycol-Propyl Glycol (SYSTANE OP), Apply 1 drop to eye daily as needed (dry eyes)., Disp: , Rfl:  .  potassium chloride (KLOR-CON) 20 MEQ packet, Take 20 mEq by mouth daily., Disp: 30 packet, Rfl: 1 .  pregabalin  (LYRICA) 150 MG capsule, Take 1 capsule (150 mg total) by mouth 2 (two) times daily., Disp: , Rfl:  .  pyridOXINE (VITAMIN B-6) 100 MG tablet, Take 1 tablet (100 mg total) by mouth daily., Disp: 30 tablet, Rfl: 3 .  senna-docusate (SENOKOT-S) 8.6-50 MG tablet, Take 1 tablet at bedtime as needed by mouth for mild constipation., Disp: , Rfl:  .  sucralfate (CARAFATE) 1 GM/10ML suspension, Take 10 mLs (1 g total) by mouth 4 (four) times daily., Disp: , Rfl:  .  traMADol (ULTRAM) 50 MG tablet, Take 1 tablet (50 mg total) by mouth every 6 (six) hours as needed., Disp: 30 tablet, Rfl: 1 .  traZODone (DESYREL) 50 MG tablet, Take 0.5 tablets (25 mg total) by mouth at bedtime as needed for sleep., Disp: , Rfl:  .  Diclofenac Sodium 1 % CREA, Apply to left arm as needed for pain three times daily (Patient not taking: Reported on 05/11/2018), Disp: 120 g, Rfl: 0 .  predniSONE & diphenhydrAMINE (CONTRAST ALLERGY PREMED PACK) 3 x 50 MG & 1 x 50 MG KIT, Take 50 mg by mouth See admin instructions. Take 15m prednisone at 13, 7 , 1 hours before to contrast study, 545mbenadryl 1 hour before. (Patient not taking: Reported on 05/11/2018), Disp: 1 kit, Rfl: 0 No current facility-administered medications for this visit.   Facility-Administered Medications Ordered in Other Visits:  .  0.9 %  sodium chloride infusion, , Intravenous, Once, YuEarlie ServerMD, Stopped at 05/11/18 1315 .  [COMPLETED] heparin lock flush 100 unit/mL, 500 Units, Intracatheter, Once PRN, YuEarlie ServerMD, 500 Units at 05/11/18 1315 .  sodium chloride flush (NS) 0.9 % injection 10 mL, 10 mL, Intravenous, PRN, YuEarlie ServerMD, 10 mL at 01/26/18 0904  Physical exam:  Vitals:   05/11/18 0912  BP: 117/84  Pulse: 71  Resp: 18  Temp: 98.6 F (37 C)  TempSrc: Tympanic  Weight: 198 lb  8 oz (90 kg)  ECOG 1 Physical Exam  Constitutional: She is oriented to person, place, and time. No distress.  HENT:  Head: Normocephalic and atraumatic.  Nose: Nose  normal.  Mouth/Throat: Oropharynx is clear and moist. No oropharyngeal exudate.  Eyes: Pupils are equal, round, and reactive to light. Conjunctivae and EOM are normal. No scleral icterus.  Neck: Normal range of motion. Neck supple. No JVD present.  Cardiovascular: Normal rate and regular rhythm.  Murmur heard. Pulmonary/Chest: Effort normal and breath sounds normal. No respiratory distress. She has no wheezes. She has no rales. She exhibits no tenderness.  Abdominal: Soft. Bowel sounds are normal. She exhibits no distension. There is no abdominal tenderness.  Musculoskeletal: Normal range of motion.        General: No tenderness, deformity or edema.  Lymphadenopathy:    She has no cervical adenopathy.  Neurological: She is alert and oriented to person, place, and time. No cranial nerve deficit. She exhibits normal muscle tone. Coordination normal.  Skin: Skin is warm and dry. She is not diaphoretic. No erythema.  Psychiatric: Affect and judgment normal.    CMP Latest Ref Rng & Units 05/11/2018  Glucose 70 - 99 mg/dL 105(H)  BUN 8 - 23 mg/dL 13  Creatinine 0.44 - 1.00 mg/dL 0.98  Sodium 135 - 145 mmol/L 139  Potassium 3.5 - 5.1 mmol/L 4.2  Chloride 98 - 111 mmol/L 101  CO2 22 - 32 mmol/L 28  Calcium 8.9 - 10.3 mg/dL 8.5(L)  Total Protein 6.5 - 8.1 g/dL 6.6  Total Bilirubin 0.3 - 1.2 mg/dL 0.6  Alkaline Phos 38 - 126 U/L 60  AST 15 - 41 U/L 24  ALT 0 - 44 U/L 11   CBC Latest Ref Rng & Units 05/11/2018  WBC 4.0 - 10.5 K/uL 3.1(L)  Hemoglobin 12.0 - 15.0 g/dL 10.6(L)  Hematocrit 36.0 - 46.0 % 33.3(L)  Platelets 150 - 400 K/uL 229   RADIOGRAPHIC STUDIES: I have personally reviewed the radiological images as listed and agreed with the findings in the report.  PET scan 01/23/2018  Interval development of loculated ascites within the upper abdomen which overlies the liver and spleen and exhibits increased radiotracer uptake. Findings worrisome for peritoneal carcinomatosis. Consider  further evaluation with diagnostic paracentesis. 2. Soft tissue infiltration within the omentum with increased radiotracer uptake noted. Also worrisome for peritoneal carcinomatosis. 3. New hypermetabolic internal mammary lymph nodes and right CP angle lymph node. Suspicious for metastatic adenopathy. 4. Mild FDG uptake is identified localizing to the lower third of the esophagus within SUV max of 5.15.  Bone Scan 05/08/2018  1. Several small foci of activity in anterior ribs as noted above, and metastatic involvement can not be excluded. 2. Somewhat mottled activity within the midthoracic and upper lumbar spine and metastatic involvement can not be excluded. Correlate with plain films or MRI preferably.  Assessment and plan  Cancer Staging Malignant neoplasm of lower third of esophagus [facility]) Staging form: Esophagus - Adenocarcinoma, AJCC 8th Edition - Clinical stage from 04/10/2017: Stage IVB (cTX, cNX, pM1) - Signed by Earlie Server, MD on 04/10/2017  1. Encounter for antineoplastic chemotherapy   2. Malignant neoplasm of lower third of esophagus (HCC)   3. Neuropathy   4. Acute cystitis without hematuria   5. Left hip pain   6. Bone metastasis ([place])    #Esophageal cancer, currently on second line treatment with Cyramza and paclitaxel.   Labs are reviewed and discussed with patient. Bone scan was independently  reviewed by me and discussed with patient and her family members.  Small area on ribs and thoracic/lumbar spine concerning for metastatic disease.  No activity of her left hip. Her CBC and CMP counts acceptable to proceed with chemotherapy. UA showed no protein last week and this week however it did show moderate leukocyte and positive nitrate 1 week ago. This may corresponding to her febrile illness.  Although patient never had any urinary symptoms, she has a history of recurrent UTI/pyelonephritis without having any urinary symptoms.  She has self treated with Bactrim for 3 days  and fever has resolved. I encourage patient to continue another 2 days of Bactrim treatment.  Since she is already on antibiotics, I will hold checking urine culture.  Advised patient to call if worsening of symptoms, recurrence of fever, etc Proceed with today's Taxol and Cyramza treatment.  Taxol has been reduced to 70 mg/m. Plan to obtain CT chest abdominal pelvis to access treatment response after this cycle.   # Left hip pain, see above.  Likely osteoarthritis. #  Chronic neuropathy: Continue Lyrica, symptoms are stable.  Follow-up 2 weeks for Taxol and Cyramza treatments.   Earlie Server, MD, PhD Hematology Oncology [facility] at [facility] Pager- [phone] 05/11/18

## 2018-05-11 NOTE — Progress Notes (Signed)
Pt here for follow. States he had "fevers and confusion this past weekend."  No urinary signs and symptoms.

## 2018-05-12 ENCOUNTER — Ambulatory Visit
Admission: RE | Admit: 2018-05-12 | Discharge: 2018-05-12 | Disposition: A | Discharge status: Home / Self Care | Payer: Medicare Other | Source: Ambulatory Visit | Attending: Oncology | Admitting: Oncology

## 2018-05-12 ENCOUNTER — Other Ambulatory Visit: Payer: Self-pay | Admitting: Family Medicine

## 2018-05-12 ENCOUNTER — Other Ambulatory Visit: Payer: Self-pay | Admitting: Radiation Oncology

## 2018-05-12 DIAGNOSIS — C155 Malignant neoplasm of lower third of esophagus: Secondary | ICD-10-CM | POA: Diagnosis present

## 2018-05-12 DIAGNOSIS — M25552 Pain in left hip: Secondary | ICD-10-CM | POA: Insufficient documentation

## 2018-05-12 LAB — CEA: CEA: 1.9 ng/mL (ref 0.0–4.7)

## 2018-05-12 MED ORDER — IOHEXOL 300 MG/ML  SOLN
100.0000 mL | Freq: Once | INTRAMUSCULAR | Status: AC | PRN
  Administered 2018-05-12: 100 mL via INTRAVENOUS

## 2018-05-12 NOTE — Telephone Encounter (Signed)
Electronic refill request. Trazodone Last office visit:   04/24/18 Acute Last Filled:   #45 with 3 RF on 04/17/17 Please advise.

## 2018-05-13 NOTE — Telephone Encounter (Signed)
Sent. Thanks.   

## 2018-05-18 ENCOUNTER — Telehealth: Payer: Self-pay | Admitting: Gastroenterology

## 2018-05-18 NOTE — Telephone Encounter (Signed)
Pt left vm she states she has problems with her esophagus she has been having chocking spasms and might need another Endoscopy

## 2018-05-18 NOTE — Telephone Encounter (Signed)
Pt is calling  about prev. message °

## 2018-05-19 NOTE — Telephone Encounter (Signed)
Pt states she is able to drink and swallow but can tell it is getting more difficult. She states she had CT scan on 06/12/17 but has not gotten any results yet. Will relay message to Dr. Bonna Gains and pt aware that I will get in touch with her with her reply.

## 2018-05-20 ENCOUNTER — Telehealth: Payer: Self-pay | Admitting: Gastroenterology

## 2018-05-20 NOTE — Telephone Encounter (Signed)
Pt left vm to see if Dr. Darene Lamer was able to schedule her an EGD please call pt

## 2018-05-21 ENCOUNTER — Telehealth: Payer: Self-pay

## 2018-05-21 ENCOUNTER — Ambulatory Visit: Payer: Medicare Other | Admitting: Gastroenterology

## 2018-05-21 ENCOUNTER — Encounter: Payer: Self-pay | Admitting: Gastroenterology

## 2018-05-21 VITALS — BP 111/71 | HR 87 | Ht 62.0 in | Wt 198.4 lb

## 2018-05-21 DIAGNOSIS — R1310 Dysphagia, unspecified: Secondary | ICD-10-CM | POA: Diagnosis not present

## 2018-05-21 DIAGNOSIS — R1319 Other dysphagia: Secondary | ICD-10-CM

## 2018-05-21 NOTE — Telephone Encounter (Signed)
Referral sent to [facility] for office visit to discuss espohageal stenting. I will notify Dr. Bonna Gains once it is scheuduled.

## 2018-05-21 NOTE — Progress Notes (Signed)
[name] Antigua, MD [address]  [place]  [place], [place] [address]  Main: [phone]  Fax: [phone]   Primary Care Physician: Tonia Ghent, MD   Chief complaint: Dysphagia  HPI: [name] is a 84 y.o. female with history of radiation-induced esophageal stricture requiring dilation and steroid injection in the past, last done in September 2019 here with recurrence of dysphagia.  Patient reports symptoms have reoccurred as of the last 3 days.  Is still able to eat and drink but has noted some difficulty with both liquids and solids.  No episodes of food impaction.  Able to swallow secretions well.  Able to swallow pills.  Also reports some constipation.  No nausea or vomiting.  No blood in stool.  Current Outpatient Medications  Medication Sig Dispense Refill  . albuterol (PROVENTIL HFA;VENTOLIN HFA) 108 (90 Base) MCG/ACT inhaler Inhale 2 puffs into the lungs every 4 (four) hours as needed for wheezing or shortness of breath.    . calcium carbonate (TUMS EX) 750 MG chewable tablet Chew 1 tablet by mouth as needed for heartburn.     . chlorhexidine (PERIDEX) 0.12 % solution Use as directed 15 mLs in the mouth or throat 2 (two) times daily.    . Cholecalciferol (VITAMIN D) 2000 UNITS CAPS Take 2,000 Units by mouth daily.    . cyanocobalamin 500 MCG tablet Take 500 mcg every other day by mouth.    . Diclofenac Sodium 1 % CREA Apply to left arm as needed for pain three times daily (Patient not taking: Reported on 05/11/2018) 120 g 0  . dronabinol (MARINOL) 5 MG capsule Take 1 capsule (5 mg total) by mouth 2 (two) times daily before a meal. 60 capsule 0  . fluticasone (FLONASE) 50 MCG/ACT nasal spray Place 1-2 sprays into both nostrils daily. 16 g 5  . furosemide (LASIX) 20 MG tablet TAKE 1-2 TABLETS BY MOUTH DAILY AS NEEDED FOR EDEMA 180 tablet 1  . hydrocortisone cream 1 % Apply 1 application topically daily as needed for itching.     . loratadine (CLARITIN)  10 MG tablet Take 10 mg by mouth daily.    . metoprolol succinate (TOPROL-XL) 50 MG 24 hr tablet TAKE 1 TABLET BY MOUTH DAILY 90 tablet 1  . mirabegron ER (MYRBETRIQ) 25 MG TB24 tablet Take 1 tablet (25 mg total) by mouth daily. 30 tablet 2  . Multiple Vitamins-Minerals (MULTIVITAMIN ADULT) CHEW Chew 1 tablet by mouth daily.     . pantoprazole (PROTONIX) 40 MG tablet Take 1 tablet (40 mg total) by mouth 2 (two) times daily before a meal.    . Polyethyl Glycol-Propyl Glycol (SYSTANE OP) Apply 1 drop to eye daily as needed (dry eyes).    . potassium chloride (KLOR-CON) 20 MEQ packet Take 20 mEq by mouth daily. 30 packet 1  . predniSONE & diphenhydrAMINE (CONTRAST ALLERGY PREMED PACK) 3 x 50 MG & 1 x 50 MG KIT Take 50 mg by mouth See admin instructions. Take 12m prednisone at 13, 7 , 1 hours before to contrast study, 552mbenadryl 1 hour before. (Patient not taking: Reported on 05/11/2018) 1 kit 0  . pregabalin (LYRICA) 150 MG capsule Take 1 capsule (150 mg total) by mouth 2 (two) times daily.    . Marland KitchenyridOXINE (VITAMIN B-6) 100 MG tablet Take 1 tablet (100 mg total) by mouth daily. 30 tablet 3  . senna-docusate (SENOKOT-S) 8.6-50 MG tablet Take 1 tablet at bedtime as needed by mouth  for mild constipation.    . sucralfate (CARAFATE) 1 GM/10ML suspension Take 10 mLs (1 g total) by mouth 4 (four) times daily.    . traMADol (ULTRAM) 50 MG tablet Take 1 tablet (50 mg total) by mouth every 6 (six) hours as needed. 30 tablet 1  . traZODone (DESYREL) 50 MG tablet TAKE 1/2 TABLET BY MOUTH AT BEDTIME IF NEEDED FOR SLEEP 45 tablet 3   No current facility-administered medications for this visit.    Facility-Administered Medications Ordered in Other Visits  Medication Dose Route Frequency Provider Last Rate Last Dose  . sodium chloride flush (NS) 0.9 % injection 10 mL  10 mL Intravenous PRN Earlie Server, MD   10 mL at 01/26/18 0904    Allergies as of 05/21/2018 - Review Complete 05/12/2018  Allergen Reaction  Noted  . Cefuroxime Nausea Only 07/11/2015  . Codeine Nausea Only 01/22/2011  . Doxycycline Other (See Comments) 12/03/2013  . Gabapentin Other (See Comments) 12/19/2016  . Iohexol Itching 07/15/2013  . Other Other (See Comments) 12/03/2013  . Oxycodone Other (See Comments) 12/19/2016  . Quinolones Swelling 12/03/2013  . Synvisc [hylan g-f 20] Swelling 01/22/2011    ROS:  General: Negative for anorexia, weight loss, fever, chills, fatigue, weakness. ENT: Negative for hoarseness, difficulty swallowing , nasal congestion. CV: Negative for chest pain, angina, palpitations, dyspnea on exertion, peripheral edema.  Respiratory: Negative for dyspnea at rest, dyspnea on exertion, cough, sputum, wheezing.  GI: See history of present illness. GU:  Negative for dysuria, hematuria, urinary incontinence, urinary frequency, nocturnal urination.  Endo: Negative for unusual weight change.    Physical Examination: Vitals:   05/21/18 1324  BP: 111/71  Pulse: 87  Weight: 198 lb 6.4 oz (90 kg)  Height: _0  (1.575 m)    General: Well-nourished, well-developed in no acute distress.  Eyes: No icterus. Conjunctivae pink. Mouth: Oropharyngeal mucosa moist and pink , no lesions erythema or exudate. Neck: Supple, Trachea midline Abdomen: Bowel sounds are normal, nontender, nondistended, no hepatosplenomegaly or masses, no abdominal bruits or hernia , no rebound or guarding.   Extremities: No lower extremity edema. No clubbing or deformities. Neuro: Alert and oriented x 3.  Grossly intact. Skin: Warm and dry, no jaundice.   Psych: Alert and cooperative, normal mood and affect.   Labs: CMP     Component Value Date/Time   NA 139 05/11/2018 0838   NA 138 07/13/2013 0609   K 4.2 05/11/2018 0838   K 4.1 07/13/2013 0609   CL 101 05/11/2018 0838   CL 104 07/13/2013 0609   CO2 28 05/11/2018 0838   CO2 29 07/13/2013 0609   GLUCOSE 105 (H) 05/11/2018 0838   GLUCOSE 115 (H) 07/13/2013 0609   BUN  13 05/11/2018 0838   BUN 15 07/13/2013 0609   CREATININE 0.98 05/11/2018 0838   CREATININE 0.67 07/13/2013 0609   CALCIUM 8.5 (L) 05/11/2018 0838   CALCIUM 8.3 (L) 07/13/2013 0609   PROT 6.6 05/11/2018 0838   ALBUMIN 3.0 (L) 05/11/2018 0838   AST 24 05/11/2018 0838   ALT 11 05/11/2018 0838   ALKPHOS 60 05/11/2018 0838   BILITOT 0.6 05/11/2018 0838   GFRNONAA 54 (L) 05/11/2018 0838   GFRNONAA >60 07/13/2013 0609   GFRAA >60 05/11/2018 0838   GFRAA >60 07/13/2013 0609   Lab Results  Component Value Date   WBC 3.1 (L) 05/11/2018   HGB 10.6 (L) 05/11/2018   HCT 33.3 (L) 05/11/2018   MCV 102.5 (H) 05/11/2018  PLT 229 05/11/2018    Imaging Studies: Ct Chest W Contrast  Result Date: 05/12/2018 CLINICAL DATA:  Patient with history of esophageal cancer. Follow-up exam. EXAM: CT CHEST, ABDOMEN, AND PELVIS WITH CONTRAST TECHNIQUE: Multidetector CT imaging of the chest, abdomen and pelvis was performed following the standard protocol during bolus administration of intravenous contrast. CONTRAST:  169m OMNIPAQUE IOHEXOL 300 MG/ML  SOLN COMPARISON:  PET-CT 01/23/2018 MR abdomen pelvis 04/13/2018 FINDINGS: CT CHEST FINDINGS Cardiovascular: Right anterior chest wall Port-A-Cath is present with tip terminating in the superior vena cava. Heart is mildly enlarged. Trace fluid superior pericardial recess. Thoracic aortic and coronary arterial vascular calcifications. Mediastinum/Nodes: No enlarged axillary, mediastinal or hilar lymphadenopathy. Normal appearance of the esophagus. Interval decrease in size of 0.4 cm right internal mammary node (image 24; series 2), previously 1.0 cm. Interval decrease in size of 0.4 cm right CP angle node (image 38; series 2), previously 0.9 cm. Lungs/Pleura: Central airways are patent. Dependent atelectasis within the left lower lobe. No large area pulmonary consolidation. No pleural effusion or pneumothorax. Musculoskeletal: Thoracic spine degenerative changes. No  aggressive or acute appearing osseous lesions. CT ABDOMEN PELVIS FINDINGS Hepatobiliary: Interval increase in size of low-attenuation lesion within the right hepatic lobe measuring 2.5 x 2.8 cm (image 47; series 2), previously 1.6 x 1.6 cm. Interval increase in size of right hepatic lobe lesion measuring 3.0 x 3.1 cm (image 58; series 2), previously 2.0 x 1.7 cm. Interval increase in size of right hepatic lobe lesion measuring 2.0 x 1.5 cm (image 60; series 2), previously 1.2 x 0.7 cm. Interval increase in size of left hepatic lobe lesion measuring 1.4 cm (image 46; series 2), previously 0.7 cm. Pancreas: Unremarkable Spleen: Unremarkable Adrenals/Urinary Tract: Normal adrenal glands. Kidneys enhance symmetrically with contrast. Stones within the inferior pole of the left kidney. No hydronephrosis. Urinary bladder is unremarkable. Stable small low-attenuation lesions within the kidneys bilaterally. Stomach/Bowel: Stool throughout the colon. No abnormal bowel wall thickening or evidence for bowel obstruction. No free fluid or free intraperitoneal air. Stable postsurgical changes involving the stomach and small bowel. Vascular/Lymphatic: Normal caliber abdominal aorta. Peripheral calcified atherosclerotic plaque. No retroperitoneal lymphadenopathy. Reproductive: Status post hysterectomy. Other: Interval decrease in loculated ascites within the upper abdomen about the liver and left upper quadrant. Interval decrease in soft tissue nodularity involving the omentum (image 51; series 2). Musculoskeletal: Lumbar spine degenerative changes. No aggressive or acute appearing osseous lesions. IMPRESSION: 1. Interval increase in size of multiple low-attenuation lesions within the liver concerning for metastatic disease. 2. Interval decrease in ascites within the upper abdomen with decreased omental and peritoneal nodularity. 3. Interval decrease in size of right CP angle lymph node and internal mammary lymph nodes.  Electronically Signed   By: DLovey NewcomerM.D.   On: 05/12/2018 10:32   Nm Bone Scan Whole Body  Result Date: 05/08/2018 CLINICAL DATA:  History of esophageal carcinoma, left hip pain EXAM: NUCLEAR MEDICINE WHOLE BODY BONE SCAN TECHNIQUE: Whole body anterior and posterior images were obtained approximately 3 hours after intravenous injection of radiopharmaceutical. RADIOPHARMACEUTICALS:  22.7 mCi Technetium-936mDP IV COMPARISON:  None. FINDINGS: There are small foci of activity corresponding to the anterior right sixth, ninth, and left anterior sixth ribs. Metastatic lesions could not be excluded. Also there is inhomogeneous activity within the midthoracic and upper lumbar spine and metastatic involvement can not be excluded. Photopenic areas are noted within the knee secondary to knee replacements. No other abnormal focus of activity is seen. IMPRESSION: 1. Several  small foci of activity in anterior ribs as noted above, and metastatic involvement can not be excluded. 2. Somewhat mottled activity within the midthoracic and upper lumbar spine and metastatic involvement can not be excluded. Correlate with plain films or MRI preferably. Electronically Signed   By: Ivar Drape M.D.   On: 05/08/2018 14:57   Ct Abdomen Pelvis W Contrast  Result Date: 05/12/2018 CLINICAL DATA:  Patient with history of esophageal cancer. Follow-up exam. EXAM: CT CHEST, ABDOMEN, AND PELVIS WITH CONTRAST TECHNIQUE: Multidetector CT imaging of the chest, abdomen and pelvis was performed following the standard protocol during bolus administration of intravenous contrast. CONTRAST:  184m OMNIPAQUE IOHEXOL 300 MG/ML  SOLN COMPARISON:  PET-CT 01/23/2018 MR abdomen pelvis 04/13/2018 FINDINGS: CT CHEST FINDINGS Cardiovascular: Right anterior chest wall Port-A-Cath is present with tip terminating in the superior vena cava. Heart is mildly enlarged. Trace fluid superior pericardial recess. Thoracic aortic and coronary arterial vascular  calcifications. Mediastinum/Nodes: No enlarged axillary, mediastinal or hilar lymphadenopathy. Normal appearance of the esophagus. Interval decrease in size of 0.4 cm right internal mammary node (image 24; series 2), previously 1.0 cm. Interval decrease in size of 0.4 cm right CP angle node (image 38; series 2), previously 0.9 cm. Lungs/Pleura: Central airways are patent. Dependent atelectasis within the left lower lobe. No large area pulmonary consolidation. No pleural effusion or pneumothorax. Musculoskeletal: Thoracic spine degenerative changes. No aggressive or acute appearing osseous lesions. CT ABDOMEN PELVIS FINDINGS Hepatobiliary: Interval increase in size of low-attenuation lesion within the right hepatic lobe measuring 2.5 x 2.8 cm (image 47; series 2), previously 1.6 x 1.6 cm. Interval increase in size of right hepatic lobe lesion measuring 3.0 x 3.1 cm (image 58; series 2), previously 2.0 x 1.7 cm. Interval increase in size of right hepatic lobe lesion measuring 2.0 x 1.5 cm (image 60; series 2), previously 1.2 x 0.7 cm. Interval increase in size of left hepatic lobe lesion measuring 1.4 cm (image 46; series 2), previously 0.7 cm. Pancreas: Unremarkable Spleen: Unremarkable Adrenals/Urinary Tract: Normal adrenal glands. Kidneys enhance symmetrically with contrast. Stones within the inferior pole of the left kidney. No hydronephrosis. Urinary bladder is unremarkable. Stable small low-attenuation lesions within the kidneys bilaterally. Stomach/Bowel: Stool throughout the colon. No abnormal bowel wall thickening or evidence for bowel obstruction. No free fluid or free intraperitoneal air. Stable postsurgical changes involving the stomach and small bowel. Vascular/Lymphatic: Normal caliber abdominal aorta. Peripheral calcified atherosclerotic plaque. No retroperitoneal lymphadenopathy. Reproductive: Status post hysterectomy. Other: Interval decrease in loculated ascites within the upper abdomen about the  liver and left upper quadrant. Interval decrease in soft tissue nodularity involving the omentum (image 51; series 2). Musculoskeletal: Lumbar spine degenerative changes. No aggressive or acute appearing osseous lesions. IMPRESSION: 1. Interval increase in size of multiple low-attenuation lesions within the liver concerning for metastatic disease. 2. Interval decrease in ascites within the upper abdomen with decreased omental and peritoneal nodularity. 3. Interval decrease in size of right CP angle lymph node and internal mammary lymph nodes. Electronically Signed   By: DLovey NewcomerM.D.   On: 05/12/2018 10:32    Assessment and Plan:   [name] a 84y.o. y/o female with history of radiation-induced esophageal stricture requiring multiple EGD with steroid injection and dilations, last in September 2019  Given her refractory stricture, we discussed this with [facility] physicians, Dr. BMont Duttonand they would like to see her in clinic to discuss possible esophageal stenting.  I have discussed this with the patient  in clinic today and she is agreeable to proceeding with clinic visit to discuss the same.  She is able to eat and drink otherwise to maintain nutrition until further dilation or stenting.  However, I have messaged [name] and Dr. Mont Dutton, and inquired if her appointment at [facility] will be anytime soon.  If not, we can consider an EGD with dilation prior to her clinic visit to allow her to eat better.  However, if her appointment very soon, luminal evaluation is better done by the endoscopist who will then be performing the stenting so they can evaluate the esophagus themselves.  We will await their response.  I have asked patient to follow-up with her oncologist closely.  When she asked about her CT scan results, I discussed with her that her latest CT scan showed interval increase in liver lesions.  She will follow-up with Dr. Tasia Catchings in this regard.  Dr [name]

## 2018-05-21 NOTE — Telephone Encounter (Signed)
Referral sent to [facility] to arrange for office visit to discuss esophageal stent. I will notify Dr. Bonna Gains once scheduled.

## 2018-05-21 NOTE — Telephone Encounter (Signed)
-----   Message from Virgel Manifold, MD sent at 05/20/2018 10:53 AM EST ----- [name], can you let patient know that since her stricture keeps recurring, it is time to send her for evaluation for esophageal stent and if she is agreeable, message Steffanie Dunn so she can set up for clinic visit with Dr. Mont Dutton.  ----- Message ----- From: Clent Jacks, RN Sent: 05/20/2018   8:20 AM EST To: Virgel Manifold, MD  Regarding the esophageal stent....we should see this patient in our clinic first to discuss pros versus cons of esophageal stent placement  [name] ----- Message ----- From: Virgel Manifold, MD Sent: 05/19/2018   1:50 PM EST To: Clent Jacks, RN  Steffanie Dunn,  Can you have the [facility] providers review this patient's chart to evaluate for possible esophageal stent.   History of esophageal adenocarcinoma with mets and dysphagia from radiation-induced stricture.  She has had multiple dilations with multiple rounds of steroid injections, last one was in September 2019.  She did well after the September procedure until now, she is now reporting dysphagia again.  If they can evaluate her for possible esophageal stent that would be helpful.  This may be better done at [facility] itself.  Thank you

## 2018-05-22 ENCOUNTER — Other Ambulatory Visit: Payer: Self-pay | Admitting: Oncology

## 2018-05-22 ENCOUNTER — Telehealth: Payer: Self-pay

## 2018-05-22 DIAGNOSIS — C159 Malignant neoplasm of esophagus, unspecified: Secondary | ICD-10-CM

## 2018-05-22 MED ORDER — ONDANSETRON HCL 8 MG PO TABS: 8.0000 mg | ORAL_TABLET | Freq: Two times a day (BID) | ORAL | 1 refills | Status: DC | PRN

## 2018-05-22 MED ORDER — PROCHLORPERAZINE MALEATE 10 MG PO TABS: 10.0000 mg | ORAL_TABLET | Freq: Four times a day (QID) | ORAL | 1 refills | Status: DC | PRN

## 2018-05-22 NOTE — Progress Notes (Signed)
DISCONTINUE ON PATHWAY REGIMEN - Gastroesophageal     A cycle is every 28 days:     Ramucirumab      Paclitaxel   **Always confirm dose/schedule in your pharmacy ordering system**  REASON: Disease Progression PRIOR TREATMENT: GEOS14: Ramucirumab 8 mg/kg Days 1, 15 + Paclitaxel 80 mg/m2 Days 1, 8, 15 q28 Days Until Progression or Unacceptable Toxicity TREATMENT RESPONSE: Progressive Disease (PD)  START ON PATHWAY REGIMEN - Gastroesophageal     A cycle is 21 days:     Pembrolizumab   **Always confirm dose/schedule in your pharmacy ordering system**  Patient Characteristics: Distant Metastases (cM1/pM1) / Locally Recurrent Disease, Adenocarcinoma - Esophageal, GE Junction, and Gastric, Third Line and Beyond, MSS/pMMR or MSI Unknown, PD?L1 Expression  Positive(CPS ? 1) Histology: Adenocarcinoma Disease Classification: Esophageal Therapeutic Status: Distant Metastases (No Additional Staging) Line of Therapy: Third Engineer, civil (consulting) Status: MSS/pMMR PD-L1 Expression Status: PD-L1 Positive (CPS ? 1) Prior Immunotherapy Status: No Prior PD-1/PD-L1 Inhibitor Intent of Therapy: Non-Curative / Palliative Intent, Discussed with Patient

## 2018-05-22 NOTE — Telephone Encounter (Signed)
Ms. Dame has been scheduled to see Roseanne Reno at [facility] to further discuss esophageal stenting. 05/28/18 at [place].

## 2018-05-22 NOTE — Progress Notes (Signed)
ON PATHWAY REGIMEN - Gastroesophageal  No Change  Continue With Treatment as Ordered.     A cycle is every 28 days:     Ramucirumab      Paclitaxel   **Always confirm dose/schedule in your pharmacy ordering system**  Patient Characteristics: Distant Metastases (cM1/pM1) / Locally Recurrent Disease, Adenocarcinoma - Esophageal, GE Junction, and Gastric, Second Line, MSS / pMMR or MSI Unknown Histology: Adenocarcinoma Disease Classification: Esophageal Therapeutic Status: Distant Metastases (No Additional Staging) Line of Therapy: Second Line Microsatellite/Mismatch Repair Status: MSS/pMMR Intent of Therapy: Non-Curative / Palliative Intent, Discussed with Patient

## 2018-05-25 ENCOUNTER — Other Ambulatory Visit: Payer: Self-pay

## 2018-05-25 ENCOUNTER — Telehealth: Payer: Self-pay

## 2018-05-25 ENCOUNTER — Inpatient Hospital Stay: Payer: Medicare Other | Attending: Oncology

## 2018-05-25 ENCOUNTER — Encounter: Payer: Self-pay | Admitting: Oncology

## 2018-05-25 ENCOUNTER — Inpatient Hospital Stay: Payer: Medicare Other

## 2018-05-25 ENCOUNTER — Inpatient Hospital Stay: Payer: Medicare Other | Admitting: Oncology

## 2018-05-25 VITALS — BP 132/85 | HR 66 | Temp 97.0°F | Resp 18 | Wt 192.7 lb

## 2018-05-25 DIAGNOSIS — Z803 Family history of malignant neoplasm of breast: Secondary | ICD-10-CM | POA: Diagnosis not present

## 2018-05-25 DIAGNOSIS — R188 Other ascites: Secondary | ICD-10-CM

## 2018-05-25 DIAGNOSIS — R0981 Nasal congestion: Secondary | ICD-10-CM

## 2018-05-25 DIAGNOSIS — R0982 Postnasal drip: Secondary | ICD-10-CM | POA: Insufficient documentation

## 2018-05-25 DIAGNOSIS — Z8052 Family history of malignant neoplasm of bladder: Secondary | ICD-10-CM | POA: Insufficient documentation

## 2018-05-25 DIAGNOSIS — Z79899 Other long term (current) drug therapy: Secondary | ICD-10-CM

## 2018-05-25 DIAGNOSIS — M1990 Unspecified osteoarthritis, unspecified site: Secondary | ICD-10-CM | POA: Insufficient documentation

## 2018-05-25 DIAGNOSIS — C787 Secondary malignant neoplasm of liver and intrahepatic bile duct: Secondary | ICD-10-CM

## 2018-05-25 DIAGNOSIS — Z87891 Personal history of nicotine dependence: Secondary | ICD-10-CM | POA: Diagnosis not present

## 2018-05-25 DIAGNOSIS — C7951 Secondary malignant neoplasm of bone: Secondary | ICD-10-CM

## 2018-05-25 DIAGNOSIS — R946 Abnormal results of thyroid function studies: Secondary | ICD-10-CM | POA: Insufficient documentation

## 2018-05-25 DIAGNOSIS — Z5112 Encounter for antineoplastic immunotherapy: Secondary | ICD-10-CM | POA: Insufficient documentation

## 2018-05-25 DIAGNOSIS — Z7189 Other specified counseling: Secondary | ICD-10-CM

## 2018-05-25 DIAGNOSIS — G629 Polyneuropathy, unspecified: Secondary | ICD-10-CM | POA: Insufficient documentation

## 2018-05-25 DIAGNOSIS — K5900 Constipation, unspecified: Secondary | ICD-10-CM | POA: Insufficient documentation

## 2018-05-25 DIAGNOSIS — Z85820 Personal history of malignant melanoma of skin: Secondary | ICD-10-CM

## 2018-05-25 DIAGNOSIS — G4733 Obstructive sleep apnea (adult) (pediatric): Secondary | ICD-10-CM | POA: Insufficient documentation

## 2018-05-25 DIAGNOSIS — J449 Chronic obstructive pulmonary disease, unspecified: Secondary | ICD-10-CM

## 2018-05-25 DIAGNOSIS — F329 Major depressive disorder, single episode, unspecified: Secondary | ICD-10-CM | POA: Insufficient documentation

## 2018-05-25 DIAGNOSIS — R109 Unspecified abdominal pain: Secondary | ICD-10-CM

## 2018-05-25 DIAGNOSIS — C159 Malignant neoplasm of esophagus, unspecified: Secondary | ICD-10-CM

## 2018-05-25 DIAGNOSIS — C155 Malignant neoplasm of lower third of esophagus: Secondary | ICD-10-CM | POA: Insufficient documentation

## 2018-05-25 DIAGNOSIS — Z800 Family history of malignant neoplasm of digestive organs: Secondary | ICD-10-CM | POA: Insufficient documentation

## 2018-05-25 DIAGNOSIS — R5383 Other fatigue: Secondary | ICD-10-CM

## 2018-05-25 DIAGNOSIS — R634 Abnormal weight loss: Secondary | ICD-10-CM

## 2018-05-25 DIAGNOSIS — Z830 Family history of human immunodeficiency virus [HIV] disease: Secondary | ICD-10-CM

## 2018-05-25 DIAGNOSIS — Z923 Personal history of irradiation: Secondary | ICD-10-CM | POA: Insufficient documentation

## 2018-05-25 DIAGNOSIS — Z9989 Dependence on other enabling machines and devices: Secondary | ICD-10-CM | POA: Insufficient documentation

## 2018-05-25 DIAGNOSIS — Z5111 Encounter for antineoplastic chemotherapy: Secondary | ICD-10-CM

## 2018-05-25 DIAGNOSIS — R7989 Other specified abnormal findings of blood chemistry: Secondary | ICD-10-CM

## 2018-05-25 DIAGNOSIS — Z801 Family history of malignant neoplasm of trachea, bronchus and lung: Secondary | ICD-10-CM | POA: Insufficient documentation

## 2018-05-25 LAB — CBC WITH DIFFERENTIAL/PLATELET
Abs Immature Granulocytes: 0.1 10*3/uL — ABNORMAL HIGH (ref 0.00–0.07)
BASOS ABS: 0.1 10*3/uL (ref 0.0–0.1)
BASOS PCT: 1 %
Eosinophils Absolute: 0.1 10*3/uL (ref 0.0–0.5)
Eosinophils Relative: 2 %
HCT: 35.3 % — ABNORMAL LOW (ref 36.0–46.0)
Hemoglobin: 11.1 g/dL — ABNORMAL LOW (ref 12.0–15.0)
Immature Granulocytes: 2 %
Lymphocytes Relative: 16 %
Lymphs Abs: 0.8 10*3/uL (ref 0.7–4.0)
MCH: 32 pg (ref 26.0–34.0)
MCHC: 31.4 g/dL (ref 30.0–36.0)
MCV: 101.7 fL — ABNORMAL HIGH (ref 80.0–100.0)
Monocytes Absolute: 1.2 10*3/uL — ABNORMAL HIGH (ref 0.1–1.0)
Monocytes Relative: 25 %
Neutro Abs: 2.4 10*3/uL (ref 1.7–7.7)
Neutrophils Relative %: 54 %
PLATELETS: 259 10*3/uL (ref 150–400)
RBC: 3.47 MIL/uL — ABNORMAL LOW (ref 3.87–5.11)
RDW: 15.7 % — ABNORMAL HIGH (ref 11.5–15.5)
WBC: 4.6 10*3/uL (ref 4.0–10.5)
nRBC: 0 % (ref 0.0–0.2)

## 2018-05-25 LAB — TSH: TSH: 7.789 u[IU]/mL — ABNORMAL HIGH (ref 0.350–4.500)

## 2018-05-25 LAB — COMPREHENSIVE METABOLIC PANEL
ALT: 11 U/L (ref 0–44)
AST: 26 U/L (ref 15–41)
Albumin: 3 g/dL — ABNORMAL LOW (ref 3.5–5.0)
Alkaline Phosphatase: 66 U/L (ref 38–126)
Anion gap: 9 (ref 5–15)
BUN: 14 mg/dL (ref 8–23)
CO2: 28 mmol/L (ref 22–32)
Calcium: 8.7 mg/dL — ABNORMAL LOW (ref 8.9–10.3)
Chloride: 104 mmol/L (ref 98–111)
Creatinine, Ser: 0.76 mg/dL (ref 0.44–1.00)
GFR calc Af Amer: >60 mL/min (ref >60)
GFR calc non Af Amer: >60 mL/min (ref >60)
Glucose, Bld: 103 mg/dL — ABNORMAL HIGH (ref 70–99)
Potassium: 4.1 mmol/L (ref 3.5–5.1)
Sodium: 141 mmol/L (ref 135–145)
Total Bilirubin: 0.6 mg/dL (ref 0.3–1.2)
Total Protein: 6.7 g/dL (ref 6.5–8.1)

## 2018-05-25 NOTE — Telephone Encounter (Signed)
I spoke with Yvone Neu (DPR signed) pt was not seen over the weekend; pt refused to go. Pt began to feel better and pt has appt to see Dr Damita Dunnings 05/25/18 @ 2:15. I do not see appt scheduled with Dr Damita Dunnings. Yvone Neu said he would try to contact pt to see what is going on. Yvone Neu does not want to schedule appt until speaks with pt. FYI to Dr Damita Dunnings.

## 2018-05-25 NOTE — Progress Notes (Signed)
Patient here for follow up. Pt became a little confused and "couldn't put words together."

## 2018-05-25 NOTE — Progress Notes (Addendum)
Hematology/Oncology Follow Up Note [facility]  [phone] [phone]  Patient Care Team: Tonia Ghent, MD as PCP - General (Family Medicine)   Name of the patient: [name]  [id number]  1934/06/22   REASON FOR VISIT Follow up of chemotherapy management of esophageal adenocarcinoma, discussion of results of interval CT scan.  HISTORY OF PRESENT ILLNESS Patient is a 51-old female who has metastatic esophageal cancer with liver involvement.  Weight loss 20 pounds for the past year prior to diagnosis.  # EGD during admission showed partially obstructive esophageal mass at the distal part of esophagus. Biopsy was taken. Pathology showed esophageal adenocarcinoma. #US biopsy of liver lesion to proof distant metastasis.  Report family history of breast cancer, but she has been having routine mammograms.  # Lives alone. She's a former smoker.  # Molecular Testing:  #[employer] Cdx: No reportable alternations with companion diagnostic (CDx) claims.  MS stable Tumor mutation burden: Cannot be determined, CCND3 amplification: Equivocal.  Amended reports on 06/04/2017, Tumor mutational burden changed from "can not be determined" to "TMB -low" on the re-analyzed samples.  Tumor Proportion Score [TPS] 20%. # HER 2 negative.   Antineoplasm Treatments S/p 6 cycles of FOLFOX, PET scan showed excellent partial response from both chemotherapy and radiation, result was discussed with patient.  Patient was switched to 5-FU maintenance and to September 2019. S/p palliative radiation in January 2019 # April 2019 Developed esophageal stricture secondary to radiation in April and got dilation via endoscopy. EGD shoed Benign-appearing esophageal stenosis. This is a result of radiation therapy.- LA Grade C radiation esophagitis. - The previously noted esophageal mass in Nov 2018 was much decreased indicating good response to radiation/chemotherapy. -  Normal stomach.- Normal examined jejunum.- No specimens collected.   #01/23/2018 PET scan showed interval development of loculated ascites within the upper abdomen with overlies the liver and the spleen and exhibited increased radio tracer uptake.  Worrisome for peritoneal carcinomatosis.  Soft tissue infiltration within the omentum with increased radiotracer uptake noted.  Worrisome for peritoneal carcinomatosis.  New hypermetabolic internal mammary lymph nodes and a right CP angle lymph node.  Suspicious for metastatic adenopathy.  Mild FDG uptake is identified localizing to the lower third of the esophagus raising SUV max of 5.15.  01/26/2018 patient was switched to Taxol and Cyramza treatments.  INTERVAL HISTORY  Patient with above history presents for assessment prior to second line chemotherapy for treatment of metastatic esophageal cancer.  Accompanied by sister.   #During the interval, patient has got CT chest abdomen pelvis to assess treatment response to Taxol and Cyramza. #Dysphagia symptoms has gotten worse so patient was evaluated by gastroenterologist.  Patient has an appointment for second opinion at [name] for esophageal stent.  #Abdominal pain, patient has mid to left vague abdominal pain which is being chronic.  She takes 1-2 tabs tramadol as instructed.  Report pain is adequately controlled.  #Constipation, patient takes Senokot 17.6 mg daily with laxatives as needed.  Reports a few days ago, patient overdid laxatives and had a lot of diarrhea.  Today bowel movement has normalized.  #Patient reports having fever during the weekend, up to 101.  Patient's family members want to send patient to ER for further evaluation.  Patient not willing to go.  She took antibiotics Bactrim DS for 3 days.  No fever today. Denies any dysuria, increased frequency or urgency.  Denies any cough.  # Chronic numbness,  unchanged.  Takes Lyrica. #Chronic fatigue  at baseline.   Review of Systems    Constitutional: Positive for fatigue. Negative for chills, fever and unexpected weight change.  HENT:   Negative for hearing loss and voice change.        Chronic sinus congestion.   Eyes: Negative for eye problems.  Respiratory: Negative for chest tightness and cough.   Cardiovascular: Negative for chest pain.  Gastrointestinal: Positive for abdominal pain. Negative for abdominal distention and blood in stool.  Endocrine: Negative for hot flashes.  Genitourinary: Negative for difficulty urinating and frequency.   Musculoskeletal: Negative for arthralgias and flank pain.  Skin: Negative for itching and rash.  Neurological: Positive for numbness. Negative for extremity weakness.  Hematological: Negative for adenopathy.  Psychiatric/Behavioral: Negative for confusion.     Allergies  Allergen Reactions  . Cefuroxime Nausea Only  . Codeine Nausea Only  . Doxycycline Other (See Comments)    Lip swelling  . Gabapentin Other (See Comments)    Swelling.   . Iohexol Itching    07-15-13 pt developed itching on fingers after contrast given. Dr. Irish Elders looked at pt and said to put in system as allergy. BB  . Other Other (See Comments)    Contrast Dye- caused fever, chills, awful feeling Bandages - itching, rash  . Oxycodone Other (See Comments)    nausea  . Quinolones Swelling    Lip swelling  . Synvisc [Hylan G-F 20] Swelling     Past Medical History:  Diagnosis Date  . Arthritis   . Asthma   . COPD (chronic obstructive pulmonary disease) ([place])   . Dysphonia   . Dyspnea   . Esophageal cancer ([place])   . GERD (gastroesophageal reflux disease)   . Heart murmur   . History of kidney stones   . Hypertension   . Hypokalemia 10/07/2017  . Melanoma ([place])   . OAB (overactive bladder)   . OSA on CPAP   . 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 Personal history of chemotherapy    esophageal cancer  . Personal history of radiation therapy    esophageal cancer  . Pupil  asymmetry    R pupil defect  . Skin cancer   . Sleep apnea   . Spinal cord abscess      Past Surgical History:  Procedure Laterality Date  . ABDOMINAL HYSTERECTOMY    . ANTERIOR CERVICAL DECOMP/DISCECTOMY FUSION N/A 07/16/2013   Procedure: ANTERIOR CERVICAL DECOMPRESSION/DISCECTOMY FUSION mutlipleLEVELS C4-7;  Surgeon: Erline Levine, MD;  Location: [place] [facility];  Service: Neurosurgery;  Laterality: N/A;  . APPENDECTOMY    . carpel tunn Bilateral   . CATARACT EXTRACTION    . CATARACT EXTRACTION, BILATERAL    . CHOLECYSTECTOMY    . dental implant    . ESOPHAGOGASTRODUODENOSCOPY Left 03/21/2017   Procedure: ESOPHAGOGASTRODUODENOSCOPY (EGD);  Surgeon: Virgel Manifold, MD;  Location[facility] ENDOSCOPY;  Service: Endoscopy;  Laterality: Left;  . ESOPHAGOGASTRODUODENOSCOPY (EGD) WITH PROPOFOL N/A 08/20/2017   Procedure: ESOPHAGOGASTRODUODENOSCOPY (EGD) WITH PROPOFOL;  Surgeon: Virgel Manifold, MD;  Location: [facility] ENDOSCOPY;  Service: Endoscopy;  Laterality: N/A;  . ESOPHAGOGASTRODUODENOSCOPY (EGD) WITH PROPOFOL N/A 09/19/2017   Procedure: ESOPHAGOGASTRODUODENOSCOPY (EGD) WITH PROPOFOL;  Surgeon: Virgel Manifold, MD;  Location: [facility] ENDOSCOPY;  Service: Endoscopy;  Laterality: N/A;  . ESOPHAGOGASTRODUODENOSCOPY (EGD) WITH PROPOFOL N/A 10/03/2017   Procedure: ESOPHAGOGASTRODUODENOSCOPY (EGD) WITH PROPOFOL;  Surgeon: Virgel Manifold, MD;  Location: [facility] ENDOSCOPY;  Service: Endoscopy;  Laterality: N/A;  . ESOPHAGOGASTRODUODENOSCOPY (EGD) WITH  PROPOFOL N/A 10/22/2017   Procedure: ESOPHAGOGASTRODUODENOSCOPY (EGD) WITH PROPOFOL;  Surgeon: Virgel Manifold, MD;  Location: [facility] ENDOSCOPY;  Service: Endoscopy;  Laterality: N/A;  . ESOPHAGOGASTRODUODENOSCOPY (EGD) WITH PROPOFOL N/A 11/18/2017   Procedure: ESOPHAGOGASTRODUODENOSCOPY (EGD) WITH PROPOFOL;  Surgeon: Virgel Manifold, MD;  Location: [facility] ENDOSCOPY;  Service: Endoscopy;  Laterality: N/A;  . ESOPHAGOGASTRODUODENOSCOPY (EGD)  WITH PROPOFOL N/A 12/17/2017   Procedure: ESOPHAGOGASTRODUODENOSCOPY (EGD) WITH PROPOFOL;  Surgeon: Virgel Manifold, MD;  Location: [facility] ENDOSCOPY;  Service: Endoscopy;  Laterality: N/A;  . ESOPHAGOGASTRODUODENOSCOPY (EGD) WITH PROPOFOL N/A 01/08/2018   Procedure: ESOPHAGOGASTRODUODENOSCOPY (EGD) WITH PROPOFOL;  Surgeon: Virgel Manifold, MD;  Location: [facility] ENDOSCOPY;  Service: Endoscopy;  Laterality: N/A;  . ESOPHAGOGASTRODUODENOSCOPY (EGD) WITH PROPOFOL N/A 01/22/2018   Procedure: ESOPHAGOGASTRODUODENOSCOPY (EGD) WITH PROPOFOL;  Surgeon: Virgel Manifold, MD;  Location: [facility] ENDOSCOPY;  Service: Endoscopy;  Laterality: N/A;  . EYE SURGERY    . GASTRIC BYPASS    . HERNIA REPAIR    . HIATAL HERNIA REPAIR    . JOINT REPLACEMENT    . MELANOMA EXCISION    . PORTA CATH INSERTION N/A 04/07/2017   Procedure: PORTA CATH INSERTION;  Surgeon: Algernon Huxley, MD;  Location: [place] CV LAB;  Service: Cardiovascular;  Laterality: N/A;  . POSTERIOR CERVICAL FUSION/FORAMINOTOMY N/A 07/28/2013   Procedure: Cervical four-seven  posterior cervical fusion;  Surgeon: Erline Levine, MD;  Location: [facility] NEURO ORS;  Service: Neurosurgery;  Laterality: N/A;  . TONSILLECTOMY    . TOTAL KNEE ARTHROPLASTY      Social History   Socioeconomic History  . Marital status: Widowed    Spouse name: Not on file  . Number of children: 2  . Years of education: Not on file  . Highest education level: Master's degree (e.g., MA, MS, MEng, MEd, MSW, MBA)  Occupational History  . Not on file  Social Needs  . Financial resource strain: Not hard at all  . Food insecurity:    Worry: Never true    Inability: Never true  . Transportation needs:    Medical: No    Non-medical: No  Tobacco Use  . Smoking status: Former Smoker    Packs/day: 1.00    Years: 20.00    Pack years: 20.00    Types: Cigarettes    Last attempt to quit: 1977    Years since quitting: 43.0  . Smokeless tobacco: Never Used  Substance and  Sexual Activity  . Alcohol use: No  . Drug use: Never  . Sexual activity: Not Currently  Lifestyle  . Physical activity:    Days per week: 2 days    Minutes per session: 30 min  . Stress: Not at all  Relationships  . Social connections:    Talks on phone: More than three times a week    Gets together: More than three times a week    Attends religious service: More than 4 times per year    Active member of club or organization: Yes    Attends meetings of clubs or organizations: More than 4 times per year    Relationship status: Widowed  . Intimate partner violence:    Fear of current or ex partner: No    Emotionally abused: No    Physically abused: No    Forced sexual activity: No  Other Topics Concern  . Not on file  Social History Narrative   Marital Status- Widowed    Lives by herself    Architectural technologist- Retired Pharmacist, hospital  Exercise hx- Does PT     Family History  Problem Relation Age of Onset  . Breast cancer Mother 39  . Arthritis-Osteo Mother   . Breast cancer Sister 71  . Bladder Cancer Sister   . Bladder Cancer Father   . Tongue cancer Father   . Congenital heart disease Father   . Prostate cancer Brother   . Uterine cancer Maternal Aunt   . Lung cancer Paternal Uncle   . Leukemia Maternal Grandmother   . Liver cancer Paternal Grandmother   . Colon cancer Neg Hx      Current Outpatient Medications:  .  albuterol (PROVENTIL HFA;VENTOLIN HFA) 108 (90 Base) MCG/ACT inhaler, Inhale 2 puffs into the lungs every 4 (four) hours as needed for wheezing or shortness of breath., Disp: , Rfl:  .  calcium carbonate (TUMS EX) 750 MG chewable tablet, Chew 1 tablet by mouth as needed for heartburn. , Disp: , Rfl:  .  chlorhexidine (PERIDEX) 0.12 % solution, Use as directed 15 mLs in the mouth or throat 2 (two) times daily., Disp: , Rfl:  .  Cholecalciferol (VITAMIN D) 2000 UNITS CAPS, Take 2,000 Units by mouth daily., Disp: , Rfl:  .  cyanocobalamin 500 MCG tablet, Take 500 mcg  every other day by mouth., Disp: , Rfl:  .  Diclofenac Sodium 1 % CREA, Apply to left arm as needed for pain three times daily, Disp: 120 g, Rfl: 0 .  dronabinol (MARINOL) 5 MG capsule, Take 1 capsule (5 mg total) by mouth 2 (two) times daily before a meal., Disp: 60 capsule, Rfl: 0 .  fluticasone (FLONASE) 50 MCG/ACT nasal spray, Place 1-2 sprays into both nostrils daily., Disp: 16 g, Rfl: 5 .  furosemide (LASIX) 20 MG tablet, TAKE 1-2 TABLETS BY MOUTH DAILY AS NEEDED FOR EDEMA, Disp: 180 tablet, Rfl: 1 .  hydrocortisone cream 1 %, Apply 1 application topically daily as needed for itching. , Disp: , Rfl:  .  loratadine (CLARITIN) 10 MG tablet, Take 10 mg by mouth daily., Disp: , Rfl:  .  metoprolol succinate (TOPROL-XL) 50 MG 24 hr tablet, TAKE 1 TABLET BY MOUTH DAILY, Disp: 90 tablet, Rfl: 1 .  mirabegron ER (MYRBETRIQ) 25 MG TB24 tablet, Take 1 tablet (25 mg total) by mouth daily., Disp: 30 tablet, Rfl: 2 .  Multiple Vitamins-Minerals (MULTIVITAMIN ADULT) CHEW, Chew 1 tablet by mouth daily. , Disp: , Rfl:  .  ondansetron (ZOFRAN) 8 MG tablet, Take 1 tablet (8 mg total) by mouth 2 (two) times daily as needed (Nausea or vomiting)., Disp: 30 tablet, Rfl: 1 .  pantoprazole (PROTONIX) 40 MG tablet, Take 1 tablet (40 mg total) by mouth 2 (two) times daily before a meal., Disp: , Rfl:  .  Polyethyl Glycol-Propyl Glycol (SYSTANE OP), Apply 1 drop to eye daily as needed (dry eyes)., Disp: , Rfl:  .  potassium chloride (KLOR-CON) 20 MEQ packet, Take 20 mEq by mouth daily., Disp: 30 packet, Rfl: 1 .  pregabalin (LYRICA) 150 MG capsule, Take 1 capsule (150 mg total) by mouth 2 (two) times daily., Disp: , Rfl:  .  prochlorperazine (COMPAZINE) 10 MG tablet, Take 1 tablet (10 mg total) by mouth every 6 (six) hours as needed (Nausea or vomiting)., Disp: 30 tablet, Rfl: 1 .  pyridOXINE (VITAMIN B-6) 100 MG tablet, Take 1 tablet (100 mg total) by mouth daily., Disp: 30 tablet, Rfl: 3 .  senna-docusate (SENOKOT-S)  8.6-50 MG tablet, Take 1 tablet at bedtime as needed  by mouth for mild constipation., Disp: , Rfl:  .  sucralfate (CARAFATE) 1 GM/10ML suspension, Take 10 mLs (1 g total) by mouth 4 (four) times daily., Disp: , Rfl:  .  traMADol (ULTRAM) 50 MG tablet, Take 1 tablet (50 mg total) by mouth every 6 (six) hours as needed., Disp: 30 tablet, Rfl: 1 .  traZODone (DESYREL) 50 MG tablet, TAKE 1/2 TABLET BY MOUTH AT BEDTIME IF NEEDED FOR SLEEP, Disp: 45 tablet, Rfl: 3 No current facility-administered medications for this visit.   Facility-Administered Medications Ordered in Other Visits:  .  sodium chloride flush (NS) 0.9 % injection 10 mL, 10 mL, Intravenous, PRN, Earlie Server, MD, 10 mL at 01/26/18 0904  Physical exam:  Vitals:   05/25/18 1018  BP: 132/85  Pulse: 66  Resp: 18  Temp: (!) 97 F (36.1 C)  TempSrc: Tympanic  Weight: 192 lb 11.2 oz (87.4 kg)  ECOG 1 Physical Exam  Constitutional: She is oriented to person, place, and time. No distress.  HENT:  Head: Normocephalic and atraumatic.  Nose: Nose normal.  Mouth/Throat: Oropharynx is clear and moist. No oropharyngeal exudate.  Eyes: Pupils are equal, round, and reactive to light. Conjunctivae and EOM are normal. No scleral icterus.  Neck: Normal range of motion. Neck supple. No JVD present.  Cardiovascular: Normal rate and regular rhythm.  Murmur heard. Pulmonary/Chest: Effort normal and breath sounds normal. No respiratory distress. She has no wheezes. She has no rales. She exhibits no tenderness.  Abdominal: Soft. Bowel sounds are normal. She exhibits no distension. There is no abdominal tenderness.  Musculoskeletal: Normal range of motion.        General: No tenderness, deformity or edema.  Lymphadenopathy:    She has no cervical adenopathy.  Neurological: She is alert and oriented to person, place, and time. No cranial nerve deficit. She exhibits normal muscle tone. Coordination normal.  Skin: Skin is warm and dry. She is not  diaphoretic. No erythema.  Psychiatric: Affect and judgment normal.    CMP Latest Ref Rng & Units 05/25/2018  Glucose 70 - 99 mg/dL 103(H)  BUN 8 - 23 mg/dL 14  Creatinine 0.44 - 1.00 mg/dL 0.76  Sodium 135 - 145 mmol/L 141  Potassium 3.5 - 5.1 mmol/L 4.1  Chloride 98 - 111 mmol/L 104  CO2 22 - 32 mmol/L 28  Calcium 8.9 - 10.3 mg/dL 8.7(L)  Total Protein 6.5 - 8.1 g/dL 6.7  Total Bilirubin 0.3 - 1.2 mg/dL 0.6  Alkaline Phos 38 - 126 U/L 66  AST 15 - 41 U/L 26  ALT 0 - 44 U/L 11   CBC Latest Ref Rng & Units 05/25/2018  WBC 4.0 - 10.5 K/uL 4.6  Hemoglobin 12.0 - 15.0 g/dL 11.1(L)  Hematocrit 36.0 - 46.0 % 35.3(L)  Platelets 150 - 400 K/uL 259   RADIOGRAPHIC STUDIES: I have personally reviewed the radiological images as listed and agreed with the findings in the report.  PET scan 01/23/2018  Interval development of loculated ascites within the upper abdomen which overlies the liver and spleen and exhibits increased radiotracer uptake. Findings worrisome for peritoneal carcinomatosis. Consider further evaluation with diagnostic paracentesis. 2. Soft tissue infiltration within the omentum with increased radiotracer uptake noted. Also worrisome for peritoneal carcinomatosis. 3. New hypermetabolic internal mammary lymph nodes and right CP angle lymph node. Suspicious for metastatic adenopathy. 4. Mild FDG uptake is identified localizing to the lower third of the esophagus within SUV max of 5.15.  Bone Scan 05/08/2018  1.  Several small foci of activity in anterior ribs as noted above, and metastatic involvement can not be excluded. 2. Somewhat mottled activity within the midthoracic and upper lumbar spine and metastatic involvement can not be excluded. Correlate with plain films or MRI preferably.  Assessment and plan  Cancer Staging Malignant neoplasm of lower third of esophagus [facility]) Staging form: Esophagus - Adenocarcinoma, AJCC 8th Edition - Clinical stage from 04/10/2017:  Stage IVB (cTX, cNX, pM1) - Signed by Earlie Server, MD on 04/10/2017  1. Esophageal adenocarcinoma ([place])   2. Encounter for antineoplastic immunotherapy   3. Neuropathy   4. Abdominal pain, unspecified abdominal location   5. Goals of care, counseling/discussion   6. Bone metastasis ([place])    #Esophageal cancer, has been on second line treatment with Cyramza and paclitaxel since September 2019. Interval CT chest abdomen pelvis unfortunately showed mixed response. CT image was independently reviewed by me and discussed with patient. While there is interval decreasing ascites within the upper abdomen with decrease omental and peritoneal nodularity, Multiple liver metastasis have increased the size concerning for progression of disease.  Patient also had a bone scan which was independent reviewed by me and discussed with her.  She has small area on ribs in the thoracic lumbar spine concerning for metastatic disease.  Discussed with patient about switching to third line treatment with immunotherapy [Keytruda].  Patient's PDL 1 showed low expression, tumor proportion 20%. Discussed with patient that overall response rate is about 20%  I discussed the mechanism of action and rationale of using immunotherapy.  The goal of therapy is palliative; and length of treatments are likely ongoing/based upon the results of the scans. Discussed the potential side effects of immunotherapy including but not limited to infusion reactions, diarrhea; skin rash; respiratory failure, neurotoxicity, elevated LFTs/endocrine abnormalities etc. Patient voices understanding and willing to proceed with treatment. Currently we are awaiting insurance approval for [place].  She is scheduled to have Keytruda treatment later this week. Baseline TSH was checked which showed slightly elevated TSH, last TSH was done about 1 year ago which was normal. I will repeat TSH in 1 week and then decide whether she will need starting thyroid  replacement.  #Bone metastasis, I have discussed about obtaining dental clearance for starting Zometa.  #  Chronic neuropathy: Continue Lyrica, symptoms are stable.  Follow-up in 1 week to assess tolerability of immunotherapy.  Check CBC CMP. Total face to face encounter time for this patient visit was 40 min. >50% of the time was  spent in counseling and coordination of care.  Earlie Server, MD, PhD Hematology Oncology [facility] Pager- [phone] 05/25/18

## 2018-05-25 NOTE — Telephone Encounter (Signed)
Patient says she thinks she is still doing better and does not want to schedule an appointment right now.  She will be starting a new medication from Oncology on Monday and feels she has enough going on right now.

## 2018-05-25 NOTE — Addendum Note (Signed)
Addended by: Earlie Server on: 05/25/2018 12:32 PM   Modules accepted: Orders

## 2018-05-25 NOTE — Telephone Encounter (Signed)
It looks like she is being seen at oncology today.  Please check on patient later today.

## 2018-05-25 NOTE — Telephone Encounter (Signed)
[facility] Patient Name: [name] Gender: Female DOB: 01-06-1935 Age: 84 Y 23 M 22 D Return Phone Number: [phone] (Primary) Address: City/State/Zip: [place] [address] Client [facility] Night - Client Client Site [place] Physician Renford Dills - MD Contact Type Call Who Is Calling Patient / Member / Family / Caregiver Call Type Triage / Clinical Caller Name Chrissie Noa Tillis Relationship To Patient Son Return Phone Number [phone] (Primary) Chief Complaint Dehydration (>1 YEAR) Reason for Call Symptomatic / Request for [place] states his mother may have a UTI and he wants to know where she can go to be tested today. He doesn't want to take her to the emergency room because he doesn't thinks she will go. She also seems dehydrated. Translation No Nurse Assessment Nurse: Harlow Mares, RN, Suanne Marker Date/Time Eilene Ghazi Time): 05/23/2018 11:57:01 AM Confirm and document reason for call. If symptomatic, describe symptoms. ---Caller states his mother may have a UTI and he wants to know where she can go to be tested today. He doesn't want to take her to the emergency room because he doesn't thinks she will go. She also seems dehydrated. Awoke with diarrhea, she is awake but confused. Is having problems making words, currently getting chemo. She is normally independent and lucid. Does the patient have any new or worsening symptoms? ---Yes Will a triage be completed? ---Yes Related visit to physician within the last 2 weeks? ---Yes Does the PT have any chronic conditions? (i.e. diabetes, asthma, this includes High risk factors for pregnancy, etc.) ---Yes List chronic conditions. ---esophageal cancer with mets; Is this a behavioral health or substance abuse call? ---No Guidelines Guideline Title  Affirmed Question Affirmed Notes Nurse Date/Time (Eastern Time) Confusion - Delirium [1] Loss of speech or garbled speech AND [2] new onset Ferd Glassing 05/23/2018 12:00:29 PM Disp. Time Eilene Ghazi Time) Disposition Final User 05/23/2018 12:06:50 PM 911 Outcome Documentation Harlow Mares, RN, Suanne Marker PLEASE NOTE: All timestamps contained within this report are represented as [place] Standard Time. CONFIDENTIALTY NOTICE: This fax transmission is intended only for the addressee. It contains information that is legally privileged, confidential or otherwise protected from use or disclosure. If you are not the intended recipient, you are strictly prohibited from reviewing, disclosing, copying using or disseminating any of this information or taking any action in reliance on or regarding this information. If you have received this fax in error, please notify us immediately by telephone so that we can arrange for its return to [place]. Phone: [phone], Toll-Free: [phone], Fax: [phone] Page: 2 of 2 Call Id: [id number] [place]. Time (Eastern Time) Disposition Final User Reason: Caller reports that the patient refuses to go to the ED but he will try to get her to the [facility] 05/23/2018 12:06:13 PM Call EMS 911 Now Yes Harlow Mares, RN, Suanne Marker Caller Disagree/Comply Disagree Caller Understands Yes PreDisposition InappropriateToAsk Care Advice Given Per Guideline CALL EMS 911 NOW: * Immediate medical attention is needed. You need to hang up and call 911 (or an ambulance). * Triager Discretion: I'll call you back in a few minutes to be sure you were able to reach them. CARE ADVICE given per Confusion-Delirium (Adult) guideline. Referrals GO TO FACILITY REFUSED

## 2018-05-26 LAB — CEA: CEA: 1.9 ng/mL (ref 0.0–4.7)

## 2018-05-26 NOTE — Telephone Encounter (Signed)
Noted. Thanks.

## 2018-05-27 ENCOUNTER — Inpatient Hospital Stay: Payer: Medicare Other

## 2018-05-27 VITALS — BP 125/75 | HR 60 | Temp 98.2°F | Resp 18

## 2018-05-27 DIAGNOSIS — C155 Malignant neoplasm of lower third of esophagus: Secondary | ICD-10-CM | POA: Diagnosis not present

## 2018-05-27 DIAGNOSIS — C159 Malignant neoplasm of esophagus, unspecified: Secondary | ICD-10-CM

## 2018-05-27 MED ORDER — SODIUM CHLORIDE 0.9 % IV SOLN
Freq: Once | INTRAVENOUS | Status: AC
  Administered 2018-05-27: 12:00:00 via INTRAVENOUS
  Filled 2018-05-27: qty 250

## 2018-05-27 MED ORDER — SODIUM CHLORIDE 0.9 % IV SOLN
200.0000 mg | Freq: Once | INTRAVENOUS | Status: AC
  Administered 2018-05-27: 200 mg via INTRAVENOUS
  Filled 2018-05-27: qty 8

## 2018-05-27 MED ORDER — HEPARIN SOD (PORK) LOCK FLUSH 100 UNIT/ML IV SOLN
500.0000 [IU] | Freq: Once | INTRAVENOUS | Status: AC | PRN
  Administered 2018-05-27: 500 [IU]
  Filled 2018-05-27: qty 5

## 2018-05-27 NOTE — Progress Notes (Signed)
Pt tolerated infusion well. Pt stable at discharge. 

## 2018-05-28 DIAGNOSIS — C159 Malignant neoplasm of esophagus, unspecified: Secondary | ICD-10-CM

## 2018-06-02 ENCOUNTER — Telehealth: Payer: Self-pay | Admitting: Gastroenterology

## 2018-06-02 NOTE — Telephone Encounter (Signed)
Patient called and states she has appointment at [facility] for EGD on 06-10-18.Ddoes she still need her appointment with Dr Bonna Gains on 06-11-18?

## 2018-06-03 ENCOUNTER — Other Ambulatory Visit: Payer: Self-pay

## 2018-06-03 ENCOUNTER — Inpatient Hospital Stay (HOSPITAL_BASED_OUTPATIENT_CLINIC_OR_DEPARTMENT_OTHER): Payer: Medicare Other | Admitting: Oncology

## 2018-06-03 ENCOUNTER — Encounter: Payer: Self-pay | Admitting: Oncology

## 2018-06-03 ENCOUNTER — Telehealth: Payer: Self-pay | Admitting: Family Medicine

## 2018-06-03 ENCOUNTER — Inpatient Hospital Stay: Payer: Medicare Other

## 2018-06-03 VITALS — BP 122/86 | HR 66 | Temp 97.2°F | Resp 18 | Wt 195.4 lb

## 2018-06-03 DIAGNOSIS — Z8052 Family history of malignant neoplasm of bladder: Secondary | ICD-10-CM

## 2018-06-03 DIAGNOSIS — R7989 Other specified abnormal findings of blood chemistry: Secondary | ICD-10-CM

## 2018-06-03 DIAGNOSIS — C155 Malignant neoplasm of lower third of esophagus: Secondary | ICD-10-CM

## 2018-06-03 DIAGNOSIS — Z801 Family history of malignant neoplasm of trachea, bronchus and lung: Secondary | ICD-10-CM

## 2018-06-03 DIAGNOSIS — Z923 Personal history of irradiation: Secondary | ICD-10-CM | POA: Diagnosis not present

## 2018-06-03 DIAGNOSIS — G629 Polyneuropathy, unspecified: Secondary | ICD-10-CM

## 2018-06-03 DIAGNOSIS — C159 Malignant neoplasm of esophagus, unspecified: Secondary | ICD-10-CM

## 2018-06-03 DIAGNOSIS — Z800 Family history of malignant neoplasm of digestive organs: Secondary | ICD-10-CM

## 2018-06-03 DIAGNOSIS — R0982 Postnasal drip: Secondary | ICD-10-CM

## 2018-06-03 DIAGNOSIS — C787 Secondary malignant neoplasm of liver and intrahepatic bile duct: Secondary | ICD-10-CM | POA: Diagnosis not present

## 2018-06-03 DIAGNOSIS — F32A Depression, unspecified: Secondary | ICD-10-CM

## 2018-06-03 DIAGNOSIS — J449 Chronic obstructive pulmonary disease, unspecified: Secondary | ICD-10-CM

## 2018-06-03 DIAGNOSIS — C7951 Secondary malignant neoplasm of bone: Secondary | ICD-10-CM

## 2018-06-03 DIAGNOSIS — F329 Major depressive disorder, single episode, unspecified: Secondary | ICD-10-CM

## 2018-06-03 DIAGNOSIS — M1990 Unspecified osteoarthritis, unspecified site: Secondary | ICD-10-CM

## 2018-06-03 DIAGNOSIS — K5900 Constipation, unspecified: Secondary | ICD-10-CM

## 2018-06-03 DIAGNOSIS — R5383 Other fatigue: Secondary | ICD-10-CM

## 2018-06-03 DIAGNOSIS — Z85820 Personal history of malignant melanoma of skin: Secondary | ICD-10-CM

## 2018-06-03 DIAGNOSIS — Z803 Family history of malignant neoplasm of breast: Secondary | ICD-10-CM

## 2018-06-03 DIAGNOSIS — Z79899 Other long term (current) drug therapy: Secondary | ICD-10-CM

## 2018-06-03 DIAGNOSIS — R634 Abnormal weight loss: Secondary | ICD-10-CM

## 2018-06-03 DIAGNOSIS — Z9989 Dependence on other enabling machines and devices: Secondary | ICD-10-CM

## 2018-06-03 DIAGNOSIS — R188 Other ascites: Secondary | ICD-10-CM

## 2018-06-03 DIAGNOSIS — R946 Abnormal results of thyroid function studies: Secondary | ICD-10-CM

## 2018-06-03 DIAGNOSIS — G4733 Obstructive sleep apnea (adult) (pediatric): Secondary | ICD-10-CM

## 2018-06-03 DIAGNOSIS — Z87891 Personal history of nicotine dependence: Secondary | ICD-10-CM

## 2018-06-03 DIAGNOSIS — R109 Unspecified abdominal pain: Secondary | ICD-10-CM

## 2018-06-03 LAB — CBC WITH DIFFERENTIAL/PLATELET
Abs Immature Granulocytes: 0.2 10*3/uL — ABNORMAL HIGH (ref 0.00–0.07)
Basophils Absolute: 0 10*3/uL (ref 0.0–0.1)
Basophils Relative: 0 %
EOS ABS: 0.2 10*3/uL (ref 0.0–0.5)
EOS PCT: 2 %
HCT: 37.9 % (ref 36.0–46.0)
Hemoglobin: 11.7 g/dL — ABNORMAL LOW (ref 12.0–15.0)
Immature Granulocytes: 2 %
Lymphocytes Relative: 7 %
Lymphs Abs: 0.7 10*3/uL (ref 0.7–4.0)
MCH: 31.2 pg (ref 26.0–34.0)
MCHC: 30.9 g/dL (ref 30.0–36.0)
MCV: 101.1 fL — ABNORMAL HIGH (ref 80.0–100.0)
MONO ABS: 0.9 10*3/uL (ref 0.1–1.0)
Monocytes Relative: 9 %
Neutro Abs: 7.9 10*3/uL — ABNORMAL HIGH (ref 1.7–7.7)
Neutrophils Relative %: 80 %
Platelets: 281 10*3/uL (ref 150–400)
RBC: 3.75 MIL/uL — ABNORMAL LOW (ref 3.87–5.11)
RDW: 15.6 % — AB (ref 11.5–15.5)
WBC: 9.9 10*3/uL (ref 4.0–10.5)
nRBC: 0 % (ref 0.0–0.2)

## 2018-06-03 LAB — COMPREHENSIVE METABOLIC PANEL
ALT: 11 U/L (ref 0–44)
AST: 24 U/L (ref 15–41)
Albumin: 2.8 g/dL — ABNORMAL LOW (ref 3.5–5.0)
Alkaline Phosphatase: 72 U/L (ref 38–126)
Anion gap: 8 (ref 5–15)
BUN: 14 mg/dL (ref 8–23)
CO2: 30 mmol/L (ref 22–32)
Calcium: 8.8 mg/dL — ABNORMAL LOW (ref 8.9–10.3)
Chloride: 101 mmol/L (ref 98–111)
Creatinine, Ser: 0.83 mg/dL (ref 0.44–1.00)
GFR calc non Af Amer: >60 mL/min (ref >60)
Glucose, Bld: 116 mg/dL — ABNORMAL HIGH (ref 70–99)
Potassium: 4.3 mmol/L (ref 3.5–5.1)
Sodium: 139 mmol/L (ref 135–145)
Total Bilirubin: 0.5 mg/dL (ref 0.3–1.2)
Total Protein: 6.9 g/dL (ref 6.5–8.1)

## 2018-06-03 LAB — T4, FREE: Free T4: 1.21 ng/dL (ref 0.82–1.77)

## 2018-06-03 LAB — TSH: TSH: 4.881 u[IU]/mL — AB (ref 0.350–4.500)

## 2018-06-03 MED ORDER — TRAZODONE HCL 50 MG PO TABS: 25.0000 mg | ORAL_TABLET | Freq: Every evening | ORAL | Status: AC | PRN

## 2018-06-03 NOTE — Telephone Encounter (Signed)
Please call patient.  I saw the notes from hematology/oncology.  Reasonable to try increasing trazodone to 50 mg at night.  She can see if that helps with sleep and with her mood.  If she is not doing a lot better on that dose then we can talk about discontinuing the medication and potentially changing to Remeron.  Please update me as needed.  Thanks.

## 2018-06-03 NOTE — Progress Notes (Signed)
Patient here for follow up. Pt complains of pain to left abdomen. Per son, Pt's thought process is still not at baseline since UTI.

## 2018-06-03 NOTE — Progress Notes (Signed)
Hematology/Oncology Follow Up Note [facility]  [phone] [phone]  Patient Care Team: Tonia Ghent, MD as PCP - General (Family Medicine)   Name of the patient: [name]  [id number]  [birth year]   REASON FOR VISIT Follow up of chemotherapy management of esophageal adenocarcinoma, discussion of results of interval CT scan.  HISTORY OF PRESENT ILLNESS Patient is a 8-old female who has metastatic esophageal cancer with liver involvement.  Weight loss 20 pounds for the past year prior to diagnosis.  # EGD during admission showed partially obstructive esophageal mass at the distal part of esophagus. Biopsy was taken. Pathology showed esophageal adenocarcinoma. #US biopsy of liver lesion to proof distant metastasis.  Report family history of breast cancer, but she has been having routine mammograms.  # Lives alone. She's a former smoker.  # Molecular Testing:  #[employer] Cdx: No reportable alternations with companion diagnostic (CDx) claims.  MS stable Tumor mutation burden: Cannot be determined, CCND3 amplification: Equivocal.  Amended reports on 06/04/2017, Tumor mutational burden changed from "can not be determined" to "TMB -low" on the re-analyzed samples.  Tumor Proportion Score [TPS] 20%. # HER 2 negative.   Antineoplasm Treatments S/p 6 cycles of FOLFOX, PET scan showed excellent partial response from both chemotherapy and radiation, result was discussed with patient.  Patient was switched to 5-FU maintenance and to September 2019. S/p palliative radiation in January 2019 # April 2019 Developed esophageal stricture secondary to radiation in April and got dilation via endoscopy. EGD shoed Benign-appearing esophageal stenosis. This is a result of radiation therapy.- LA Grade C radiation esophagitis. - The previously noted esophageal mass in Nov 2018 was much decreased indicating good response to radiation/chemotherapy. -  Normal stomach.- Normal examined jejunum.- No specimens collected.   #01/23/2018 PET scan showed interval development of loculated ascites within the upper abdomen with overlies the liver and the spleen and exhibited increased radio tracer uptake.  Worrisome for peritoneal carcinomatosis.  Soft tissue infiltration within the omentum with increased radiotracer uptake noted.  Worrisome for peritoneal carcinomatosis.  New hypermetabolic internal mammary lymph nodes and a right CP angle lymph node.  Suspicious for metastatic adenopathy.  Mild FDG uptake is identified localizing to the lower third of the esophagus raising SUV max of 5.15.  01/26/2018 patient was switched to Taxol and Cyramza treatments.  INTERVAL HISTORY  Patient with above history presents for assessment prior to second line chemotherapy for treatment of metastatic esophageal cancer.  Accompanied by her son.   #Patient is currently on third line treatment for metastatic esophageal adenocarcinoma.  She is on Keytruda and received last dose about 1 week ago.  Reports no concerning side effects so far.  Denies any skin rash, nausea, diarrhea.  Son reports that patient's fatigue level varies a lot.  There will work couple of days that she just sitting her couch doing nothing, recently energy level perked up again.  #Continues to have postnasal drip, bringing up thick lowish mucus.  Denies any nasal congestion, facial pain. She had been referred to ENT in December 2019 and was evaluated.  We are trying to obtain records from ENT clinic.  She completed course of antibiotics and sinusitis improved at that time. Patient did not remember that she had been seen by ENT physician. #Abdominal pain, location "bandlike" across her abdomen.  She takes tramadol for pain.  Only takes 50 mg once a day on average.  Reports pain is 6-7 out of 10. #Fatigue, chronic and  intermittent. #Depression, using has been started on 25 mg trazodone nightly by primary care  provider Dr. Damita Dunnings. # Chronic numbness,  unchanged.  Takes Lyrica. #Chronic fatigue at baseline.   Review of Systems  Constitutional: Positive for fatigue. Negative for chills, fever and unexpected weight change.  HENT:   Negative for hearing loss and voice change.        Chronic sinus congestion.   Eyes: Negative for eye problems.  Respiratory: Negative for chest tightness and cough.   Cardiovascular: Negative for chest pain.  Gastrointestinal: Positive for abdominal pain. Negative for abdominal distention and blood in stool.  Endocrine: Negative for hot flashes.  Genitourinary: Negative for difficulty urinating and frequency.   Musculoskeletal: Negative for arthralgias and flank pain.  Skin: Negative for itching and rash.  Neurological: Positive for numbness. Negative for extremity weakness.  Hematological: Negative for adenopathy.  Psychiatric/Behavioral: Negative for confusion.     Allergies  Allergen Reactions  . Cefuroxime Nausea Only  . Codeine Nausea Only  . Doxycycline Other (See Comments)    Lip swelling  . Gabapentin Other (See Comments)    Swelling.   . Iohexol Itching    07-15-13 pt developed itching on fingers after contrast given. Dr. Irish Elders looked at pt and said to put in system as allergy. BB  . Other Other (See Comments)    Contrast Dye- caused fever, chills, awful feeling Bandages - itching, rash  . Oxycodone Other (See Comments)    nausea  . Quinolones Swelling    Lip swelling  . Synvisc [Hylan G-F 20] Swelling     Past Medical History:  Diagnosis Date  . Arthritis   . Asthma   . COPD (chronic obstructive pulmonary disease) ([place])   . Dysphonia   . Dyspnea   . Esophageal cancer ([place])   . GERD (gastroesophageal reflux disease)   . Heart murmur   . History of kidney stones   . Hypertension   . Hypokalemia 10/07/2017  . Melanoma ([place])   . OAB (overactive bladder)   . OSA on CPAP   . 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  Personal history of chemotherapy    esophageal cancer  . Personal history of radiation therapy    esophageal cancer  . Pupil asymmetry    R pupil defect  . Skin cancer   . Sleep apnea   . Spinal cord abscess      Past Surgical History:  Procedure Laterality Date  . ABDOMINAL HYSTERECTOMY    . ANTERIOR CERVICAL DECOMP/DISCECTOMY FUSION N/A 07/16/2013   Procedure: ANTERIOR CERVICAL DECOMPRESSION/DISCECTOMY FUSION mutlipleLEVELS C4-7;  Surgeon: Erline Levine, MD;  Location: [place] [facility];  Service: Neurosurgery;  Laterality: N/A;  . APPENDECTOMY    . carpel tunn Bilateral   . CATARACT EXTRACTION    . CATARACT EXTRACTION, BILATERAL    . CHOLECYSTECTOMY    . dental implant    . ESOPHAGOGASTRODUODENOSCOPY Left 03/21/2017   Procedure: ESOPHAGOGASTRODUODENOSCOPY (EGD);  Surgeon: Virgel Manifold, MD;  Location[facility] END[facility]COPY;  Service: Endoscopy;  Laterality: Left;  . ESOPHAGOGASTRODUODENOSCOPY (EGD) WITH PROPOFOL N/A 08/20/2017   Procedure: ESOPHAGOGASTRODUODENOSCOPY (EGD) WITH PROPOFOL;  Surgeon: Virgel Manifold, MD;  Location: [facility] ENDOSCOPY;  Service: Endoscopy;  Laterality: N/A;  . ESOPHAGOGASTRODUODENOSCOPY (EGD) WITH PROPOFOL N/A 09/19/2017   Procedure: ESOPHAGOGASTRODUODENOSCOPY (EGD) WITH PROPOFOL;  Surgeon: Virgel Manifold, MD;  Location: [facility] ENDOSCOPY;  Service: Endoscopy;  Laterality: N/A;  . ESOPHAGOGASTRODUODENOSCOPY (EGD) WITH PROPOFOL N/A 10/03/2017  Procedure: ESOPHAGOGASTRODUODENOSCOPY (EGD) WITH PROPOFOL;  Surgeon: Virgel Manifold, MD;  Location: [facility] ENDOSCOPY;  Service: Endoscopy;  Laterality: N/A;  . ESOPHAGOGASTRODUODENOSCOPY (EGD) WITH PROPOFOL N/A 10/22/2017   Procedure: ESOPHAGOGASTRODUODENOSCOPY (EGD) WITH PROPOFOL;  Surgeon: Virgel Manifold, MD;  Location: [facility] ENDOSCOPY;  Service: Endoscopy;  Laterality: N/A;  . ESOPHAGOGASTRODUODENOSCOPY (EGD) WITH PROPOFOL N/A 11/18/2017   Procedure: ESOPHAGOGASTRODUODENOSCOPY (EGD) WITH PROPOFOL;  Surgeon:  Virgel Manifold, MD;  Location: [facility] ENDOSCOPY;  Service: Endoscopy;  Laterality: N/A;  . ESOPHAGOGASTRODUODENOSCOPY (EGD) WITH PROPOFOL N/A 12/17/2017   Procedure: ESOPHAGOGASTRODUODENOSCOPY (EGD) WITH PROPOFOL;  Surgeon: Virgel Manifold, MD;  Location: [facility] ENDOSCOPY;  Service: Endoscopy;  Laterality: N/A;  . ESOPHAGOGASTRODUODENOSCOPY (EGD) WITH PROPOFOL N/A 01/08/2018   Procedure: ESOPHAGOGASTRODUODENOSCOPY (EGD) WITH PROPOFOL;  Surgeon: Virgel Manifold, MD;  Location: [facility] ENDOSCOPY;  Service: Endoscopy;  Laterality: N/A;  . ESOPHAGOGASTRODUODENOSCOPY (EGD) WITH PROPOFOL N/A 01/22/2018   Procedure: ESOPHAGOGASTRODUODENOSCOPY (EGD) WITH PROPOFOL;  Surgeon: Virgel Manifold, MD;  Location: [facility] ENDOSCOPY;  Service: Endoscopy;  Laterality: N/A;  . EYE SURGERY    . GASTRIC BYPASS    . HERNIA REPAIR    . HIATAL HERNIA REPAIR    . JOINT REPLACEMENT    . MELANOMA EXCISION    . PORTA CATH INSERTION N/A 04/07/2017   Procedure: PORTA CATH INSERTION;  Surgeon: Algernon Huxley, MD;  Location: [place] CV LAB;  Service: Cardiovascular;  Laterality: N/A;  . POSTERIOR CERVICAL FUSION/FORAMINOTOMY N/A 07/28/2013   Procedure: Cervical four-seven  posterior cervical fusion;  Surgeon: Erline Levine, MD;  Location: [place] NEURO ORS;  Service: Neurosurgery;  Laterality: N/A;  . TONSILLECTOMY    . TOTAL KNEE ARTHROPLASTY      Social History   Socioeconomic History  . Marital status: Widowed    Spouse name: Not on file  . Number of children: 2  . Years of education: Not on file  . Highest education level: Master's degree (e.g., MA, MS, MEng, MEd, MSW, MBA)  Occupational History  . Not on file  Social Needs  . Financial resource strain: Not hard at all  . Food insecurity:    Worry: Never true    Inability: Never true  . Transportation needs:    Medical: No    Non-medical: No  Tobacco Use  . Smoking status: Former Smoker    Packs/day: 1.00    Years: 20.00    Pack years: 20.00     Types: Cigarettes    Last attempt to quit: 1977    Years since quitting: 43.0  . Smokeless tobacco: Never Used  Substance and Sexual Activity  . Alcohol use: No  . Drug use: Never  . Sexual activity: Not Currently  Lifestyle  . Physical activity:    Days per week: 2 days    Minutes per session: 30 min  . Stress: Not at all  Relationships  . Social connections:    Talks on phone: More than three times a week    Gets together: More than three times a week    Attends religious service: More than 4 times per year    Active member of club or organization: Yes    Attends meetings of clubs or organizations: More than 4 times per year    Relationship status: Widowed  . Intimate partner violence:    Fear of current or ex partner: No    Emotionally abused: No    Physically abused: No    Forced sexual activity: No  Other Topics Concern  .  Not on file  Social History Narrative   Marital Status- Widowed    Lives by herself    Employement- Retired Pharmacist, hospital   Exercise hx- Does PT     Family History  Problem Relation Age of Onset  . Breast cancer Mother 69  . Arthritis-Osteo Mother   . Breast cancer Sister 69  . Bladder Cancer Sister   . Bladder Cancer Father   . Tongue cancer Father   . Congenital heart disease Father   . Prostate cancer Brother   . Uterine cancer Maternal Aunt   . Lung cancer Paternal Uncle   . Leukemia Maternal Grandmother   . Liver cancer Paternal Grandmother   . Colon cancer Neg Hx      Current Outpatient Medications:  .  albuterol (PROVENTIL HFA;VENTOLIN HFA) 108 (90 Base) MCG/ACT inhaler, Inhale 2 puffs into the lungs every 4 (four) hours as needed for wheezing or shortness of breath., Disp: , Rfl:  .  calcium carbonate (TUMS EX) 750 MG chewable tablet, Chew 1 tablet by mouth as needed for heartburn. , Disp: , Rfl:  .  chlorhexidine (PERIDEX) 0.12 % solution, Use as directed 15 mLs in the mouth or throat 2 (two) times daily., Disp: , Rfl:  .   Cholecalciferol (VITAMIN D) 2000 UNITS CAPS, Take 2,000 Units by mouth daily., Disp: , Rfl:  .  cyanocobalamin 500 MCG tablet, Take 500 mcg every other day by mouth., Disp: , Rfl:  .  Diclofenac Sodium 1 % CREA, Apply to left arm as needed for pain three times daily, Disp: 120 g, Rfl: 0 .  dronabinol (MARINOL) 5 MG capsule, Take 1 capsule (5 mg total) by mouth 2 (two) times daily before a meal., Disp: 60 capsule, Rfl: 0 .  fluticasone (FLONASE) 50 MCG/ACT nasal spray, Place 1-2 sprays into both nostrils daily., Disp: 16 g, Rfl: 5 .  furosemide (LASIX) 20 MG tablet, TAKE 1-2 TABLETS BY MOUTH DAILY AS NEEDED FOR EDEMA, Disp: 180 tablet, Rfl: 1 .  hydrocortisone cream 1 %, Apply 1 application topically daily as needed for itching. , Disp: , Rfl:  .  loratadine (CLARITIN) 10 MG tablet, Take 10 mg by mouth daily., Disp: , Rfl:  .  metoprolol succinate (TOPROL-XL) 50 MG 24 hr tablet, TAKE 1 TABLET BY MOUTH DAILY, Disp: 90 tablet, Rfl: 1 .  mirabegron ER (MYRBETRIQ) 25 MG TB24 tablet, Take 1 tablet (25 mg total) by mouth daily., Disp: 30 tablet, Rfl: 2 .  Multiple Vitamins-Minerals (MULTIVITAMIN ADULT) CHEW, Chew 1 tablet by mouth daily. , Disp: , Rfl:  .  ondansetron (ZOFRAN) 8 MG tablet, Take 1 tablet (8 mg total) by mouth 2 (two) times daily as needed (Nausea or vomiting)., Disp: 30 tablet, Rfl: 1 .  pantoprazole (PROTONIX) 40 MG tablet, Take 1 tablet (40 mg total) by mouth 2 (two) times daily before a meal., Disp: , Rfl:  .  Polyethyl Glycol-Propyl Glycol (SYSTANE OP), Apply 1 drop to eye daily as needed (dry eyes)., Disp: , Rfl:  .  potassium chloride (KLOR-CON) 20 MEQ packet, Take 20 mEq by mouth daily., Disp: 30 packet, Rfl: 1 .  pregabalin (LYRICA) 150 MG capsule, Take 1 capsule (150 mg total) by mouth 2 (two) times daily., Disp: , Rfl:  .  prochlorperazine (COMPAZINE) 10 MG tablet, Take 1 tablet (10 mg total) by mouth every 6 (six) hours as needed (Nausea or vomiting)., Disp: 30 tablet, Rfl: 1 .   pyridOXINE (VITAMIN B-6) 100 MG tablet, Take 1  tablet (100 mg total) by mouth daily., Disp: 30 tablet, Rfl: 3 .  senna-docusate (SENOKOT-S) 8.6-50 MG tablet, Take 1 tablet at bedtime as needed by mouth for mild constipation., Disp: , Rfl:  .  sucralfate (CARAFATE) 1 GM/10ML suspension, Take 10 mLs (1 g total) by mouth 4 (four) times daily., Disp: , Rfl:  .  traMADol (ULTRAM) 50 MG tablet, Take 1 tablet (50 mg total) by mouth every 6 (six) hours as needed., Disp: 30 tablet, Rfl: 1 .  traZODone (DESYREL) 50 MG tablet, TAKE 1/2 TABLET BY MOUTH AT BEDTIME IF NEEDED FOR SLEEP, Disp: 45 tablet, Rfl: 3 No current facility-administered medications for this visit.   Facility-Administered Medications Ordered in Other Visits:  .  sodium chloride flush (NS) 0.9 % injection 10 mL, 10 mL, Intravenous, PRN, Earlie Server, MD, 10 mL at 01/26/18 0904  Physical exam:  Vitals:   06/03/18 0937  BP: 122/86  Pulse: 66  Resp: 18  Temp: (!) 97.2 F (36.2 C)  TempSrc: Tympanic  Weight: 195 lb 6.4 oz (88.6 kg)  ECOG 2 Physical Exam  Constitutional: She is oriented to person, place, and time. No distress.  HENT:  Head: Normocephalic and atraumatic.  Nose: Nose normal.  Mouth/Throat: Oropharynx is clear and moist. No oropharyngeal exudate.  Eyes: Pupils are equal, round, and reactive to light. Conjunctivae and EOM are normal. No scleral icterus.  Neck: Normal range of motion. Neck supple. No JVD present.  Cardiovascular: Normal rate and regular rhythm.  Murmur heard. Pulmonary/Chest: Effort normal and breath sounds normal. No respiratory distress. She has no wheezes. She has no rales. She exhibits no tenderness.  Abdominal: Soft. Bowel sounds are normal. She exhibits no distension. There is no abdominal tenderness.  Musculoskeletal: Normal range of motion.        General: No tenderness, deformity or edema.  Lymphadenopathy:    She has no cervical adenopathy.  Neurological: She is alert and oriented to person,  place, and time. No cranial nerve deficit. She exhibits normal muscle tone. Coordination normal.  Skin: Skin is warm and dry. She is not diaphoretic. No erythema.  Psychiatric: Affect and judgment normal.    CMP Latest Ref Rng & Units 06/03/2018  Glucose 70 - 99 mg/dL 116(H)  BUN 8 - 23 mg/dL 14  Creatinine 0.44 - 1.00 mg/dL 0.83  Sodium 135 - 145 mmol/L 139  Potassium 3.5 - 5.1 mmol/L 4.3  Chloride 98 - 111 mmol/L 101  CO2 22 - 32 mmol/L 30  Calcium 8.9 - 10.3 mg/dL 8.8(L)  Total Protein 6.5 - 8.1 g/dL 6.9  Total Bilirubin 0.3 - 1.2 mg/dL 0.5  Alkaline Phos 38 - 126 U/L 72  AST 15 - 41 U/L 24  ALT 0 - 44 U/L 11   CBC Latest Ref Rng & Units 06/03/2018  WBC 4.0 - 10.5 K/uL 9.9  Hemoglobin 12.0 - 15.0 g/dL 11.7(L)  Hematocrit 36.0 - 46.0 % 37.9  Platelets 150 - 400 K/uL 281   RADIOGRAPHIC STUDIES: I have personally reviewed the radiological images as listed and agreed with the findings in the report.  PET scan 01/23/2018  Interval development of loculated ascites within the upper abdomen which overlies the liver and spleen and exhibits increased radiotracer uptake. Findings worrisome for peritoneal carcinomatosis. Consider further evaluation with diagnostic paracentesis. 2. Soft tissue infiltration within the omentum with increased radiotracer uptake noted. Also worrisome for peritoneal carcinomatosis. 3. New hypermetabolic internal mammary lymph nodes and right CP angle lymph node. Suspicious for metastatic  adenopathy. 4. Mild FDG uptake is identified localizing to the lower third of the esophagus within SUV max of 5.15.  Bone Scan 05/08/2018  1. Several small foci of activity in anterior ribs as noted above, and metastatic involvement can not be excluded. 2. Somewhat mottled activity within the midthoracic and upper lumbar spine and metastatic involvement can not be excluded. Correlate with plain films or MRI preferably.  Assessment and plan  Cancer Staging Malignant  neoplasm of lower third of esophagus [facility]) Staging form: Esophagus - Adenocarcinoma, AJCC 8th Edition - Clinical stage from 04/10/2017: Stage IVB (cTX, cNX, pM1) - Signed by Earlie Server, MD on 04/10/2017  1. Esophageal adenocarcinoma ([place])   2. Abdominal pain, unspecified abdominal location   3. Bone metastasis ([facility])   4. Elevated TSH   5. Depression, unspecified depression type    #Esophageal cancer, metastatic Currently on third line treatment with immunotherapy Keytruda. Tolerating well.  Patient son was not here during last visit when we discussed about the CT findings.  I discussed the findings in details with patient's son again today.  Baseline TSH elevated.  Repeating TSH today to confirm.  Will also obtain  free T4.  #Abdominal pain, advised patient to use tramadol 50 mg every 6 hours as needed.  Pain regimen can be further titrated if current dose not helping.  #Depression, patient feels depressed due to cancer progressing.  Son also is concerned that patient does not want to do anything. Denies any suicidal ideation.  We discussed about starting Remeron.  After reviewing patient's medication, patient has been started on 25 mg trazodone nightly by primary care physician.  We will touch base with Dr. Damita Dunnings to increase to 50 mg nightly to see if that will help her mood.  I will hold starting Remeron at this point.  #Bone metastasis, I have discussed about obtaining dental clearance for starting Zometa.  #  Chronic neuropathy: Continue Lyrica.  Symptoms are stable.  Follow-up in 2 weeks to assess tolerability of immunotherapy.  Check CBC CMP. We spent sufficient time to discuss many aspect of care, questions were answered to patient's satisfaction. Total face to face encounter time for this patient visit was 25 min. >50% of the time was  spent in counseling and coordination of care.  Earlie Server, MD, PhD Hematology Oncology [facility] at [facility] Pager-  [phone] 06/03/18

## 2018-06-04 ENCOUNTER — Telehealth: Payer: Self-pay | Admitting: Gastroenterology

## 2018-06-04 NOTE — Telephone Encounter (Signed)
Left message on patient's voicemail to return call

## 2018-06-04 NOTE — Telephone Encounter (Signed)
Patient had called in earlier wanting to know if she needed to keep her appointment for Dr Bonna Gains on 06-11-18. Per DR Bonna Gains Please call and schedule her clinic appointment for end of February so we can check on how she is doing then.

## 2018-06-05 NOTE — Telephone Encounter (Signed)
I called & r/s patient to 07-06-2018 with Dr Bonna Gains.

## 2018-06-05 NOTE — Telephone Encounter (Signed)
Patient says she is not having any problems sleeping except on the day that she takes her chemo from the hospital.  It was advised that perhaps she could take the 50 mg Trazodone on that evening to see if that helped.  She agreed.  Patient was advised to continue as is unless Dr. Damita Dunnings has other instructions and if so, I will get back with her.  Patient agreed.

## 2018-06-05 NOTE — Telephone Encounter (Signed)
Pt returned call to [place].

## 2018-06-05 NOTE — Telephone Encounter (Signed)
Agreed, I'll defer to her.  Thanks.

## 2018-06-08 ENCOUNTER — Emergency Department
Admission: EM | Admit: 2018-06-08 | Discharge: 2018-06-08 | Disposition: A | Discharge status: Home / Self Care | Payer: Medicare Other | Attending: Emergency Medicine | Admitting: Emergency Medicine

## 2018-06-08 ENCOUNTER — Other Ambulatory Visit: Payer: Self-pay

## 2018-06-08 ENCOUNTER — Telehealth: Payer: Self-pay | Admitting: *Deleted

## 2018-06-08 ENCOUNTER — Emergency Department: Payer: Medicare Other

## 2018-06-08 ENCOUNTER — Encounter: Payer: Self-pay | Admitting: Emergency Medicine

## 2018-06-08 DIAGNOSIS — R531 Weakness: Secondary | ICD-10-CM | POA: Diagnosis present

## 2018-06-08 DIAGNOSIS — Z87891 Personal history of nicotine dependence: Secondary | ICD-10-CM | POA: Insufficient documentation

## 2018-06-08 DIAGNOSIS — N3001 Acute cystitis with hematuria: Secondary | ICD-10-CM | POA: Insufficient documentation

## 2018-06-08 DIAGNOSIS — E860 Dehydration: Secondary | ICD-10-CM | POA: Diagnosis not present

## 2018-06-08 DIAGNOSIS — Z79899 Other long term (current) drug therapy: Secondary | ICD-10-CM | POA: Diagnosis not present

## 2018-06-08 DIAGNOSIS — I10 Essential (primary) hypertension: Secondary | ICD-10-CM | POA: Insufficient documentation

## 2018-06-08 DIAGNOSIS — J45909 Unspecified asthma, uncomplicated: Secondary | ICD-10-CM | POA: Diagnosis not present

## 2018-06-08 DIAGNOSIS — K5900 Constipation, unspecified: Secondary | ICD-10-CM | POA: Diagnosis not present

## 2018-06-08 LAB — URINALYSIS, COMPLETE (UACMP) WITH MICROSCOPIC
Bilirubin Urine: NEGATIVE
Glucose, UA: NEGATIVE mg/dL
Ketones, ur: NEGATIVE mg/dL
Nitrite: POSITIVE — AB
Protein, ur: 30 mg/dL — AB
Specific Gravity, Urine: 1.009 (ref 1.005–1.030)
WBC, UA: >50 WBC/hpf — ABNORMAL HIGH (ref 0–5)
pH: 6 (ref 5.0–8.0)

## 2018-06-08 LAB — COMPREHENSIVE METABOLIC PANEL
ALK PHOS: 68 U/L (ref 38–126)
ALT: 11 U/L (ref 0–44)
AST: 25 U/L (ref 15–41)
Albumin: 2.7 g/dL — ABNORMAL LOW (ref 3.5–5.0)
Anion gap: 7 (ref 5–15)
BUN: 20 mg/dL (ref 8–23)
CALCIUM: 8.3 mg/dL — AB (ref 8.9–10.3)
CO2: 30 mmol/L (ref 22–32)
Chloride: 97 mmol/L — ABNORMAL LOW (ref 98–111)
Creatinine, Ser: 0.96 mg/dL (ref 0.44–1.00)
GFR calc Af Amer: >60 mL/min (ref >60)
GFR, EST NON AFRICAN AMERICAN: 55 mL/min — AB (ref >60)
Glucose, Bld: 107 mg/dL — ABNORMAL HIGH (ref 70–99)
Potassium: 4.6 mmol/L (ref 3.5–5.1)
Sodium: 134 mmol/L — ABNORMAL LOW (ref 135–145)
TOTAL PROTEIN: 6.6 g/dL (ref 6.5–8.1)
Total Bilirubin: 0.7 mg/dL (ref 0.3–1.2)

## 2018-06-08 LAB — INFLUENZA PANEL BY PCR (TYPE A & B)
Influenza A By PCR: NEGATIVE
Influenza B By PCR: NEGATIVE

## 2018-06-08 LAB — CBC
HCT: 38 % (ref 36.0–46.0)
Hemoglobin: 11.7 g/dL — ABNORMAL LOW (ref 12.0–15.0)
MCH: 31.1 pg (ref 26.0–34.0)
MCHC: 30.8 g/dL (ref 30.0–36.0)
MCV: 101.1 fL — ABNORMAL HIGH (ref 80.0–100.0)
NRBC: 0 % (ref 0.0–0.2)
Platelets: 329 10*3/uL (ref 150–400)
RBC: 3.76 MIL/uL — ABNORMAL LOW (ref 3.87–5.11)
RDW: 15.8 % — ABNORMAL HIGH (ref 11.5–15.5)
WBC: 10.3 10*3/uL (ref 4.0–10.5)

## 2018-06-08 LAB — TROPONIN I: Troponin I: <0.03 ng/mL (ref <0.03)

## 2018-06-08 MED ORDER — SODIUM CHLORIDE 0.9% FLUSH
3.0000 mL | Freq: Once | INTRAVENOUS | Status: AC
  Administered 2018-06-08: 3 mL via INTRAVENOUS

## 2018-06-08 MED ORDER — SODIUM CHLORIDE 0.9 % IV BOLUS
1000.0000 mL | Freq: Once | INTRAVENOUS | Status: AC
  Administered 2018-06-08: 1000 mL via INTRAVENOUS

## 2018-06-08 MED ORDER — CEPHALEXIN 500 MG PO CAPS: 500.0000 mg | ORAL_CAPSULE | Freq: Three times a day (TID) | ORAL | 0 refills | Status: AC

## 2018-06-08 MED ORDER — CEPHALEXIN 500 MG PO CAPS
500.0000 mg | ORAL_CAPSULE | Freq: Once | ORAL | Status: DC
  Filled 2018-06-08: qty 1

## 2018-06-08 NOTE — Discharge Instructions (Addendum)
Constipation: Take colace twice a day everyday. Take senna once a day at bedtime. Take daily probiotics. Drink plenty of fluids and eat a diet rich in fiber. If you go more than 3 days without a bowel movement, take 1 cap full of milk of magnesium in the morning and one in the evening up to 5 days.    You have been seen in the Emergency Department (ED)  today for a urinary tract infection.  Most UTIs are caused by bacteria and need to be treated with antibiotics. It is important to complete your treatment so that the infection does not get worse. Take your antibiotics fully even if your symptoms start to get better after the first few doses. Drink PLENTY of fluids to help clear the infection.  Follow-up with your doctor or return to the ER immediately if your symptoms are getting worse, if you develop a fever, if you develop abdominal or flank pain, or if you start to vomit. Otherwise follow up with your doctor in 1 week if your symptoms are improving.   When should you call for help?  Call your doctor now or seek immediate medical care if:  Symptoms such as a fever, chills, nausea, or vomiting get worse or happen for the first time.  You have new pain in your back just below your rib cage. This is called flank pain.  There is new blood or pus in your urine.  You are not able to take or keep down your antibiotics. Your symptoms are not getting better after 48 hours of antibiotic treatment  Watch closely for changes in your health, and be sure to contact your doctor if:  You are not getting better after taking an antibiotic for 2 days.  Your symptoms go away but then come back.   How can you care for yourself at home?  Take your antibiotics as prescribed. Do not stop taking them just because you feel better. You need to take the full course of antibiotics.  Take your medicines exactly as prescribed. Your doctor may have prescribed a medicine, such as phenazopyridine (Pyridium), to help relieve  pain when you urinate. This turns your urine orange. You may stop taking it when your symptoms get better. But be sure to take all of your antibiotics, which treat the infection.  Drink extra water and juices such as cranberry and blueberry juices for the next day or two. This will help make the urine less concentrated and help wash out the bacteria causing the infection. (If you have kidney, heart, or liver disease and have to limit your fluids, talk with your doctor before you increase your fluid intake.)  Avoid drinks that are carbonated or have caffeine. They can irritate the bladder.  Urinate often. Try to empty your bladder each time.  To relieve pain, take a hot bath or lay a heating pad (set on low) over your lower belly or genital area. Never go to sleep with a heating pad in place.  To help prevent UTIs  Drink plenty of fluids, enough so that your urine is light yellow or clear like water. If you have kidney, heart, or liver disease and have to limit fluids, talk with your doctor before you increase the amount of fluids you drink.  Urinate when you have the urge. Do not hold your urine for a long time. Urinate before you go to sleep.  Keep your vagina/ penis clean.

## 2018-06-08 NOTE — Telephone Encounter (Signed)
If she is so weak that cannot pick up fork, I recommend patient to go to ER for evaluation.  Otherwise, please arrange patient to see me with lab [cbc, cmp, B12, TSH, free T4] thanks.

## 2018-06-08 NOTE — ED Triage Notes (Signed)
States being treated for esophageal cancer. Last infusion 3 weeks ago. Weakness started today.

## 2018-06-08 NOTE — ED Provider Notes (Signed)
[facility] Emergency Department Provider Note  ____________________________________________  Time seen: Approximately 7:41 PM  I have reviewed the triage vital signs and the nursing notes.   HISTORY  Chief Complaint Weakness   HPI [name] is a 84 y.o. female with history of metastatic esophageal cancer currently on Keytruda (last dose 3 weeks ago) who presents for evaluation of generalized weakness. Patient reports progressively worsening generalized weakness for one week but markedly worse since this morning. No fever, cough, URI sx, sore throat, vomiting, diarrhea, or dysuria. According to son, patient had a UTI last month and since then has not returned to baseline. No flank pain or abdominal pain. Patient reports constipation which is chronic for her. Does not remember when her last BM was. She is on Senakot and colace at home. Passing flatus, no prior h/o SOB. Patient has had a poor appetite for quite sometime now. She feels dehydrated.    Past Medical History:  Diagnosis Date  . Arthritis   . Asthma   . COPD (chronic obstructive pulmonary disease) ([place])   . Dysphonia   . Dyspnea   . Esophageal cancer ([place])   . GERD (gastroesophageal reflux disease)   . Heart murmur   . History of kidney stones   . Hypertension   . Hypokalemia 10/07/2017  . Melanoma ([place])   . OAB (overactive bladder)   . OSA on CPAP   . 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 Personal history of chemotherapy    esophageal cancer  . Personal history of radiation therapy    esophageal cancer  . Pupil asymmetry    R pupil defect  . Skin cancer   . Sleep apnea   . Spinal cord abscess     Patient Active Problem List   Diagnosis Date Noted  . Encounter for antineoplastic immunotherapy   . Abnormal LFTs (liver function tests)   . Altered mental status   . Sepsis ([place]) 04/12/2018  . UTI (urinary tract infection) 04/12/2018  . Transaminitis  04/12/2018  . Acute sinusitis 03/05/2018  . Urinary incontinence 03/05/2018  . Glycogenic acanthosis of the esophagus   . Chronic peptic ulcer of stomach   . Problems with swallowing and mastication   . Other foreign object in esophagus causing other injury, initial encounter   . Chronic gastric ulcer without hemorrhage and without perforation   . Anastomotic ulcer   . Hypokalemia 10/07/2017  . Thickening of esophagus   . Other specified diseases of the digestive system   . Stricture and stenosis of esophagus   . Radiation esophagitis   . Adverse effect of radiation   . Esophageal adenocarcinoma ([place])   . Coronary artery calcification seen on CT scan 08/14/2017  . Aortic atherosclerosis ([place]) 08/14/2017  . Aortic stenosis 07/07/2017  . Goals of care, counseling/discussion 05/13/2017  . Encounter for antineoplastic chemotherapy 05/12/2017  . Tenosynovitis, nodular 05/08/2017  . Malignant neoplasm of lower third of esophagus ([place]) 03/29/2017  . Liver lesion   . Iron deficiency anemia   . Dysphagia   . Loss of weight   . Abdominal pain 03/20/2017  . Insomnia 12/19/2016  . Advance care planning 12/19/2016  . Hoarseness 07/11/2015  . Tinnitus of both ears 07/11/2015  . Vocal cord paralysis, unilateral complete 07/11/2015  . Knee joint replaced by other means 03/26/2015  . Paresthesia 08/26/2014  . Asthma without status asthmaticus 12/03/2013  . Borderline diabetes 12/03/2013  . Essential hypertension 12/03/2013  .  OA (osteoarthritis) 12/03/2013  . Morbid obesity due to excess calories ([place]) 12/03/2013  . Overactive bladder 12/03/2013  . RLS (restless legs syndrome) 12/03/2013  . Sleep apnea 12/03/2013  . C4 spinal cord injury ([place]) 12/03/2013  . Dysphonia 11/24/2013  . Neuropathy 10/22/2013  . 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 OSA on CPAP 07/16/2013    Past Surgical History:  Procedure Laterality Date    . ABDOMINAL HYSTERECTOMY    . ANTERIOR CERVICAL DECOMP/DISCECTOMY FUSION N/A 07/16/2013   Procedure: ANTERIOR CERVICAL DECOMPRESSION/DISCECTOMY FUSION mutlipleLEVELS C4-7;  Surgeon: Erline Levine, MD;  Location: [facility];  Service: Neurosurgery;  Laterality: N/A;  . APPENDECTOMY    . carpel tunn Bilateral   . CATARACT EXTRACTION    . CATARACT EXTRACTION, BILATERAL    . CHOLECYSTECTOMY    . dental implant    . ESOPHAGOGASTRODUODENOSCOPY Left 03/21/2017   Procedure: ESOPHAGOGASTRODUODENOSCOPY (EGD);  Surgeon: Virgel Manifold, MD;  Location: [facility] ENDOSCOPY;  Service: Endoscopy;  Laterality: Left;  . ESOPHAGOGASTRODUODENOSCOPY (EGD) WITH PROPOFOL N/A 08/20/2017   Procedure: ESOPHAGOGASTRODUODENOSCOPY (EGD) WITH PROPOFOL;  Surgeon: Virgel Manifold, MD;  Location: [facility] ENDOSCOPY;  Service: Endoscopy;  Laterality: N/A;  . ESOPHAGOGASTRODUODENOSCOPY (EGD) WITH PROPOFOL N/A 09/19/2017   Procedure: ESOPHAGOGASTRODUODENOSCOPY (EGD) WITH PROPOFOL;  Surgeon: Virgel Manifold, MD;  Location: [facility] ENDOSCOPY;  Service: Endoscopy;  Laterality: N/A;  . ESOPHAGOGASTRODUODENOSCOPY (EGD) WITH PROPOFOL N/A 10/03/2017   Procedure: ESOPHAGOGASTRODUODENOSCOPY (EGD) WITH PROPOFOL;  Surgeon: Virgel Manifold, MD;  Location: [facility] ENDOSCOPY;  Service: Endoscopy;  Laterality: N/A;  . ESOPHAGOGASTRODUODENOSCOPY (EGD) WITH PROPOFOL N/A 10/22/2017   Procedure: ESOPHAGOGASTRODUODENOSCOPY (EGD) WITH PROPOFOL;  Surgeon: Virgel Manifold, MD;  Location: [facility] ENDOSCOPY;  Service: Endoscopy;  Laterality: N/A;  . ESOPHAGOGASTRODUODENOSCOPY (EGD) WITH PROPOFOL N/A 11/18/2017   Procedure: ESOPHAGOGASTRODUODENOSCOPY (EGD) WITH PROPOFOL;  Surgeon: Virgel Manifold, MD;  Location: [facility] ENDOSCOPY;  Service: Endoscopy;  Laterality: N/A;  . ESOPHAGOGASTRODUODENOSCOPY (EGD) WITH PROPOFOL N/A 12/17/2017   Procedure: ESOPHAGOGASTRODUODENOSCOPY (EGD) WITH PROPOFOL;  Surgeon: Virgel Manifold, MD;  Location: [facility]  ENDOSCOPY;  Service: Endoscopy;  Laterality: N/A;  . ESOPHAGOGASTRODUODENOSCOPY (EGD) WITH PROPOFOL N/A 01/08/2018   Procedure: ESOPHAGOGASTRODUODENOSCOPY (EGD) WITH PROPOFOL;  Surgeon: Virgel Manifold, MD;  Location: [facility] ENDOSCOPY;  Service: Endoscopy;  Laterality: N/A;  . ESOPHAGOGASTRODUODENOSCOPY (EGD) WITH PROPOFOL N/A 01/22/2018   Procedure: ESOPHAGOGASTRODUODENOSCOPY (EGD) WITH PROPOFOL;  Surgeon: Virgel Manifold, MD;  Location: [facility] ENDOSCOPY;  Service: Endoscopy;  Laterality: N/A;  . EYE SURGERY    . GASTRIC BYPASS    . HERNIA REPAIR    . HIATAL HERNIA REPAIR    . JOINT REPLACEMENT    . MELANOMA EXCISION    . PORTA CATH INSERTION N/A 04/07/2017   Procedure: PORTA CATH INSERTION;  Surgeon: Algernon Huxley, MD;  Location: [facility] CV LAB;  Service: Cardiovascular;  Laterality: N/A;  . POSTERIOR CERVICAL FUSION/FORAMINOTOMY N/A 07/28/2013   Procedure: Cervical four-seven  posterior cervical fusion;  Surgeon: Erline Levine, MD;  Location: [facility] ORS;  Service: Neurosurgery;  Laterality: N/A;  . TONSILLECTOMY    . TOTAL KNEE ARTHROPLASTY      Prior to Admission medications   Medication Sig Start Date End Date Taking? Authorizing Provider  albuterol (PROVENTIL HFA;VENTOLIN HFA) 108 (90 Base) MCG/ACT inhaler Inhale 2 puffs into the lungs every 4 (four) hours as needed for wheezing or shortness of breath.    [provider]  calcium carbonate (TUMS EX) 750 MG chewable  tablet Chew 1 tablet by mouth as needed for heartburn.     [provider]  cephALEXin (KEFLEX) 500 MG capsule Take 1 capsule (500 mg total) by mouth 3 (three) times daily for 7 days. 06/08/18 06/15/18  Rudene Re, MD  chlorhexidine (PERIDEX) 0.12 % solution Use as directed 15 mLs in the mouth or throat 2 (two) times daily.    [provider]  Cholecalciferol (VITAMIN D) 2000 UNITS CAPS Take 2,000 Units by mouth daily.    [provider]  cyanocobalamin 500 MCG tablet Take  500 mcg every other day by mouth.    [provider]  Diclofenac Sodium 1 % CREA Apply to left arm as needed for pain three times daily 04/23/17   Earlie Server, MD  dronabinol (MARINOL) 5 MG capsule Take 1 capsule (5 mg total) by mouth 2 (two) times daily before a meal. 01/26/18   Earlie Server, MD  fluticasone [facility]) 50 MCG/ACT nasal spray Place 1-2 sprays into both nostrils daily. 06/13/17   Tonia Ghent, MD  furosemide (LASIX) 20 MG tablet TAKE 1-2 TABLETS BY MOUTH DAILY AS NEEDED FOR EDEMA 05/12/18   Tonia Ghent, MD  hydrocortisone cream 1 % Apply 1 application topically daily as needed for itching.     [provider]  loratadine (CLARITIN) 10 MG tablet Take 10 mg by mouth daily.    [provider]  metoprolol succinate (TOPROL-XL) 50 MG 24 hr tablet TAKE 1 TABLET BY MOUTH DAILY 10/28/17   Tonia Ghent, MD  mirabegron ER (MYRBETRIQ) 25 MG TB24 tablet Take 1 tablet (25 mg total) by mouth daily. 03/15/18   Tonia Ghent, MD  Multiple Vitamins-Minerals (MULTIVITAMIN ADULT) CHEW Chew 1 tablet by mouth daily.     [provider]  ondansetron (ZOFRAN) 8 MG tablet Take 1 tablet (8 mg total) by mouth 2 (two) times daily as needed (Nausea or vomiting). 05/22/18   Earlie Server, MD  pantoprazole (PROTONIX) 40 MG tablet Take 1 tablet (40 mg total) by mouth 2 (two) times daily before a meal. 04/24/18   Tonia Ghent, MD  Polyethyl Glycol-Propyl Glycol (SYSTANE OP) Apply 1 drop to eye daily as needed (dry eyes).    [provider]  potassium chloride (KLOR-CON) 20 MEQ packet Take 20 mEq by mouth daily. 03/09/18   Earlie Server, MD  pregabalin (LYRICA) 150 MG capsule Take 1 capsule (150 mg total) by mouth 2 (two) times daily. 04/24/18   Tonia Ghent, MD  prochlorperazine (COMPAZINE) 10 MG tablet Take 1 tablet (10 mg total) by mouth every 6 (six) hours as needed (Nausea or vomiting). 05/22/18   Earlie Server, MD  pyridOXINE (VITAMIN B-6) 100 MG tablet Take 1 tablet (100  mg total) by mouth daily. 04/28/17   Earlie Server, MD  senna-docusate (SENOKOT-S) 8.6-50 MG tablet Take 1 tablet at bedtime as needed by mouth for mild constipation. 03/22/17   Gouru, Illene Silver, MD  sucralfate (CARAFATE) 1 GM/10ML suspension Take 10 mLs (1 g total) by mouth 4 (four) times daily. 04/24/18   Tonia Ghent, MD  traMADol (ULTRAM) 50 MG tablet Take 1 tablet (50 mg total) by mouth every 6 (six) hours as needed. 04/24/18   Tonia Ghent, MD  traZODone (DESYREL) 50 MG tablet Take 0.5-1 tablets (25-50 mg total) by mouth at bedtime as needed for sleep. 06/03/18   Tonia Ghent, MD    Allergies Cefuroxime; Codeine; Doxycycline; Gabapentin; Iohexol; Other; Oxycodone; Quinolones; and Synvisc Hoyt Koch  g-f 20]  Family History  Problem Relation Age of Onset  . Breast cancer Mother 69  . Arthritis-Osteo Mother   . Breast cancer Sister 13  . Bladder Cancer Sister   . Bladder Cancer Father   . Tongue cancer Father   . Congenital heart disease Father   . Prostate cancer Brother   . Uterine cancer Maternal Aunt   . Lung cancer Paternal Uncle   . Leukemia Maternal Grandmother   . Liver cancer Paternal Grandmother   . Colon cancer Neg Hx     Social History Social History   Tobacco Use  . Smoking status: Former Smoker    Packs/day: 1.00    Years: 20.00    Pack years: 20.00    Types: Cigarettes    Last attempt to quit: 1977    Years since quitting: 43.0  . Smokeless tobacco: Never Used  Substance Use Topics  . Alcohol use: No  . Drug use: Never    Review of Systems  Constitutional: Negative for fever. + generalized weakness Eyes: Negative for visual changes. ENT: Negative for sore throat. Neck: No neck pain  Cardiovascular: Negative for chest pain. Respiratory: Negative for shortness of breath. Gastrointestinal: Negative for abdominal pain, vomiting or diarrhea. Genitourinary: Negative for dysuria. Musculoskeletal: Negative for back pain. Skin: Negative for  rash. Neurological: Negative for headaches, weakness or numbness. Psych: No SI or HI  ____________________________________________   PHYSICAL EXAM:  VITAL SIGNS: ED Triage Vitals  Enc Vitals Group     BP 06/08/18 1633 (!) 108/49     Pulse Rate 06/08/18 1633 76     Resp 06/08/18 1633 16     Temp 06/08/18 1633 99.3 F (37.4 C)     Temp Source 06/08/18 1633 Oral     SpO2 06/08/18 1633 98 %     Weight 06/08/18 1634 184 lb (83.5 kg)     Height 06/08/18 1634 5\' 2"  (1.575 m)     Head Circumference --      Peak Flow --      Pain Score 06/08/18 1638 0     Pain Loc --      Pain Edu? --      Excl. in [place]? --     Constitutional: Alert and oriented. Well appearing and in no apparent distress. HEENT:      Head: Normocephalic and atraumatic.         Eyes: Conjunctivae are normal. Sclera is non-icteric.       Mouth/Throat: Mucous membranes are dry.       Neck: Supple with no signs of meningismus. Cardiovascular: Regular rate and rhythm. No murmurs, gallops, or rubs. 2+ symmetrical distal pulses are present in all extremities. No JVD. Respiratory: Normal respiratory effort. Lungs are clear to auscultation bilaterally. No wheezes, crackles, or rhonchi.  Gastrointestinal: Soft, non tender, and non distended with positive bowel sounds. No rebound or guarding. Musculoskeletal: Nontender with normal range of motion in all extremities. No edema, cyanosis, or erythema of extremities. Neurologic: Normal speech and language. Face is symmetric. Moving all extremities. No gross focal neurologic deficits are appreciated. Skin: Skin is warm, dry and intact. No rash noted. Psychiatric: Mood and affect are normal. Speech and behavior are normal.  ____________________________________________   LABS (all labs ordered are listed, but only abnormal results are displayed)  Labs Reviewed  CBC - Abnormal; Notable for the following components:      Result Value   RBC 3.76 (*)    Hemoglobin 11.7 (*)  MCV 101.1 (*)    RDW 15.8 (*)    All other components within normal limits  URINALYSIS, COMPLETE (UACMP) WITH MICROSCOPIC - Abnormal; Notable for the following components:   Color, Urine YELLOW (*)    APPearance CLOUDY (*)    Hgb urine dipstick SMALL (*)    Protein, ur 30 (*)    Nitrite POSITIVE (*)    Leukocytes, UA LARGE (*)    WBC, UA >50 (*)    Bacteria, UA RARE (*)    All other components within normal limits  COMPREHENSIVE METABOLIC PANEL - Abnormal; Notable for the following components:   Sodium 134 (*)    Chloride 97 (*)    Glucose, Bld 107 (*)    Calcium 8.3 (*)    Albumin 2.7 (*)    GFR calc non Af Amer 55 (*)    All other components within normal limits  TROPONIN I  INFLUENZA PANEL BY PCR (TYPE A & B)   ____________________________________________  EKG  ED ECG REPORT I, Rudene Re, the attending physician, personally viewed and interpreted this ECG.  Normal sinus rhythm, rate of 75, normal intervals, normal axis, no ST elevations or depressions.  No significant changes when compared to prior. ____________________________________________  RADIOLOGY  I have personally reviewed the images performed during this visit and I agree with the Radiologist's read.   Interpretation by Radiologist:  Dg Abdomen 1 View  Result Date: 06/08/2018 CLINICAL DATA:  No bowel movement for 1 week. Currently being treated for esophageal cancer. EXAM: ABDOMEN - 1 VIEW COMPARISON:  05/12/2018 CT FINDINGS: There is a nonspecific bowel gas pattern with scattered air containing small large bowel loops noted but without obstructive appearance. Average amount of fecal retention is seen within the ascending and proximal descending colon. No significant stool burden is identified. No free air is noted. Thoracolumbar degenerative change consistent with spondylosis is noted. No acute nor aggressive osseous abnormality is seen. IMPRESSION: Nonspecific bowel gas pattern without obstructive  appearance. No significant stool retention. Electronically Signed   By: Ashley Royalty M.D.   On: 06/08/2018 19:22     ____________________________________________   PROCEDURES  Procedure(s) performed: None Procedures Critical Care performed:  None ____________________________________________   INITIAL IMPRESSION / ASSESSMENT AND PLAN / ED COURSE  84 y.o. female with history of metastatic esophageal cancer currently on Keytruda (last dose 3 weeks ago) who presents for evaluation of generalized weakness.  Patient is well-appearing, looks slightly dry on exam but has normal vital signs, lungs are clear to auscultation, abdomen is soft with positive bowel sounds.  Differential diagnoses including dehydration versus UTI versus anemia versus influenza.  EKG with no ischemic changes. CBC and CMP with no acute changes. KUB showing mild stool burden. UA pending. Flu pending. Will give IVF    _________________________ 10:32 PM on 06/08/2018 -----------------------------------------  UA positive for UTI.  No signs of sepsis.  Flu negative.  Patient was started on Keflex and was discharged home on a 7-day course.  She looked markedly improved after fluids and felt better as well.  Recommended increase oral intake.  Discussed standard return precautions and close follow-up with patient's son.   As part of my medical decision making, I reviewed the following data within the [place] History obtained from family, Nursing notes reviewed and incorporated, Labs reviewed , EKG interpreted , Old EKG reviewed, Old chart reviewed, Radiograph reviewed , Notes from prior ED visits and [place] Controlled Substance Database    Pertinent labs &  imaging results that were available during my care of the patient were reviewed by me and considered in my medical decision making (see chart for details).    ____________________________________________   FINAL CLINICAL IMPRESSION(S) / ED  DIAGNOSES  Final diagnoses:  Generalized weakness  Dehydration  Constipation, unspecified constipation type  Acute cystitis with hematuria      NEW MEDICATIONS STARTED DURING THIS VISIT:  ED Discharge Orders         Ordered    cephALEXin (KEFLEX) 500 MG capsule  3 times daily     06/08/18 2228           Note:  This document was prepared using Dragon voice recognition software and may include unintentional dictation errors.    Rudene Re, MD 06/08/18 2233

## 2018-06-08 NOTE — ED Notes (Signed)
Patient transported to X-ray 

## 2018-06-08 NOTE — Telephone Encounter (Signed)
Patient sister called stating that patient needs B 12 injection or something because she is so weak she can hardly pick up a fork. Please advise

## 2018-06-08 NOTE — ED Triage Notes (Signed)
First Nurse Note:  Arrives for ED evaluation for weakness for several days.  Patient currently receiving Chemo and was sent to ED by Dr. Shaune Pollack.  Mask placed on patient and patient placed in Family Waiting area

## 2018-06-08 NOTE — Telephone Encounter (Signed)
Patient and son agree to go to ER

## 2018-06-11 ENCOUNTER — Ambulatory Visit: Payer: Medicare Other | Admitting: Gastroenterology

## 2018-06-17 ENCOUNTER — Inpatient Hospital Stay: Payer: Medicare Other

## 2018-06-17 ENCOUNTER — Inpatient Hospital Stay: Payer: Medicare Other | Attending: Oncology

## 2018-06-17 ENCOUNTER — Other Ambulatory Visit: Payer: Self-pay

## 2018-06-17 ENCOUNTER — Encounter: Payer: Self-pay | Admitting: Oncology

## 2018-06-17 ENCOUNTER — Inpatient Hospital Stay (HOSPITAL_BASED_OUTPATIENT_CLINIC_OR_DEPARTMENT_OTHER): Payer: Medicare Other | Admitting: Oncology

## 2018-06-17 VITALS — BP 134/79 | HR 93 | Temp 97.7°F | Resp 16 | Ht 62.0 in | Wt 199.1 lb

## 2018-06-17 DIAGNOSIS — Z79899 Other long term (current) drug therapy: Secondary | ICD-10-CM | POA: Insufficient documentation

## 2018-06-17 DIAGNOSIS — N3281 Overactive bladder: Secondary | ICD-10-CM | POA: Insufficient documentation

## 2018-06-17 DIAGNOSIS — G629 Polyneuropathy, unspecified: Secondary | ICD-10-CM | POA: Insufficient documentation

## 2018-06-17 DIAGNOSIS — Z9221 Personal history of antineoplastic chemotherapy: Secondary | ICD-10-CM

## 2018-06-17 DIAGNOSIS — R634 Abnormal weight loss: Secondary | ICD-10-CM | POA: Insufficient documentation

## 2018-06-17 DIAGNOSIS — N3000 Acute cystitis without hematuria: Secondary | ICD-10-CM | POA: Diagnosis not present

## 2018-06-17 DIAGNOSIS — R5383 Other fatigue: Secondary | ICD-10-CM

## 2018-06-17 DIAGNOSIS — K5900 Constipation, unspecified: Secondary | ICD-10-CM | POA: Diagnosis not present

## 2018-06-17 DIAGNOSIS — C159 Malignant neoplasm of esophagus, unspecified: Secondary | ICD-10-CM

## 2018-06-17 DIAGNOSIS — G4709 Other insomnia: Secondary | ICD-10-CM | POA: Diagnosis not present

## 2018-06-17 DIAGNOSIS — K219 Gastro-esophageal reflux disease without esophagitis: Secondary | ICD-10-CM

## 2018-06-17 DIAGNOSIS — R0981 Nasal congestion: Secondary | ICD-10-CM

## 2018-06-17 DIAGNOSIS — R109 Unspecified abdominal pain: Secondary | ICD-10-CM | POA: Insufficient documentation

## 2018-06-17 DIAGNOSIS — R946 Abnormal results of thyroid function studies: Secondary | ICD-10-CM | POA: Insufficient documentation

## 2018-06-17 DIAGNOSIS — G4733 Obstructive sleep apnea (adult) (pediatric): Secondary | ICD-10-CM | POA: Insufficient documentation

## 2018-06-17 DIAGNOSIS — F329 Major depressive disorder, single episode, unspecified: Secondary | ICD-10-CM | POA: Insufficient documentation

## 2018-06-17 DIAGNOSIS — Z9884 Bariatric surgery status: Secondary | ICD-10-CM | POA: Diagnosis not present

## 2018-06-17 DIAGNOSIS — C155 Malignant neoplasm of lower third of esophagus: Secondary | ICD-10-CM | POA: Insufficient documentation

## 2018-06-17 DIAGNOSIS — R531 Weakness: Secondary | ICD-10-CM | POA: Insufficient documentation

## 2018-06-17 DIAGNOSIS — Z5112 Encounter for antineoplastic immunotherapy: Secondary | ICD-10-CM

## 2018-06-17 DIAGNOSIS — C7951 Secondary malignant neoplasm of bone: Secondary | ICD-10-CM

## 2018-06-17 DIAGNOSIS — Z7984 Long term (current) use of oral hypoglycemic drugs: Secondary | ICD-10-CM

## 2018-06-17 DIAGNOSIS — J449 Chronic obstructive pulmonary disease, unspecified: Secondary | ICD-10-CM | POA: Diagnosis not present

## 2018-06-17 DIAGNOSIS — I10 Essential (primary) hypertension: Secondary | ICD-10-CM

## 2018-06-17 DIAGNOSIS — Z85820 Personal history of malignant melanoma of skin: Secondary | ICD-10-CM | POA: Insufficient documentation

## 2018-06-17 DIAGNOSIS — Z923 Personal history of irradiation: Secondary | ICD-10-CM | POA: Insufficient documentation

## 2018-06-17 DIAGNOSIS — Z9989 Dependence on other enabling machines and devices: Secondary | ICD-10-CM | POA: Insufficient documentation

## 2018-06-17 DIAGNOSIS — R600 Localized edema: Secondary | ICD-10-CM | POA: Diagnosis not present

## 2018-06-17 DIAGNOSIS — C787 Secondary malignant neoplasm of liver and intrahepatic bile duct: Secondary | ICD-10-CM | POA: Diagnosis not present

## 2018-06-17 DIAGNOSIS — Z87440 Personal history of urinary (tract) infections: Secondary | ICD-10-CM | POA: Insufficient documentation

## 2018-06-17 DIAGNOSIS — R7989 Other specified abnormal findings of blood chemistry: Secondary | ICD-10-CM

## 2018-06-17 DIAGNOSIS — M1990 Unspecified osteoarthritis, unspecified site: Secondary | ICD-10-CM | POA: Insufficient documentation

## 2018-06-17 DIAGNOSIS — Z803 Family history of malignant neoplasm of breast: Secondary | ICD-10-CM | POA: Insufficient documentation

## 2018-06-17 DIAGNOSIS — Z87891 Personal history of nicotine dependence: Secondary | ICD-10-CM | POA: Insufficient documentation

## 2018-06-17 LAB — CBC WITH DIFFERENTIAL/PLATELET
Abs Immature Granulocytes: 0.05 10*3/uL (ref 0.00–0.07)
Basophils Absolute: 0.1 10*3/uL (ref 0.0–0.1)
Basophils Relative: 1 %
Eosinophils Absolute: 0.3 10*3/uL (ref 0.0–0.5)
Eosinophils Relative: 3 %
HCT: 38.4 % (ref 36.0–46.0)
Hemoglobin: 11.8 g/dL — ABNORMAL LOW (ref 12.0–15.0)
IMMATURE GRANULOCYTES: 1 %
Lymphocytes Relative: 8 %
Lymphs Abs: 0.8 10*3/uL (ref 0.7–4.0)
MCH: 30.6 pg (ref 26.0–34.0)
MCHC: 30.7 g/dL (ref 30.0–36.0)
MCV: 99.7 fL (ref 80.0–100.0)
Monocytes Absolute: 0.9 10*3/uL (ref 0.1–1.0)
Monocytes Relative: 9 %
NEUTROS PCT: 78 %
Neutro Abs: 8.1 10*3/uL — ABNORMAL HIGH (ref 1.7–7.7)
Platelets: 401 10*3/uL — ABNORMAL HIGH (ref 150–400)
RBC: 3.85 MIL/uL — ABNORMAL LOW (ref 3.87–5.11)
RDW: 16 % — ABNORMAL HIGH (ref 11.5–15.5)
WBC: 10.1 10*3/uL (ref 4.0–10.5)
nRBC: 0 % (ref 0.0–0.2)

## 2018-06-17 LAB — COMPREHENSIVE METABOLIC PANEL
ALT: 12 U/L (ref 0–44)
AST: 29 U/L (ref 15–41)
Albumin: 2.7 g/dL — ABNORMAL LOW (ref 3.5–5.0)
Alkaline Phosphatase: 78 U/L (ref 38–126)
Anion gap: 7 (ref 5–15)
BUN: 13 mg/dL (ref 8–23)
CHLORIDE: 102 mmol/L (ref 98–111)
CO2: 28 mmol/L (ref 22–32)
Calcium: 8.3 mg/dL — ABNORMAL LOW (ref 8.9–10.3)
Creatinine, Ser: 0.86 mg/dL (ref 0.44–1.00)
GFR calc Af Amer: >60 mL/min (ref >60)
Glucose, Bld: 147 mg/dL — ABNORMAL HIGH (ref 70–99)
Potassium: 3.6 mmol/L (ref 3.5–5.1)
Sodium: 137 mmol/L (ref 135–145)
Total Bilirubin: 0.3 mg/dL (ref 0.3–1.2)
Total Protein: 6.9 g/dL (ref 6.5–8.1)

## 2018-06-17 MED ORDER — TRAMADOL HCL 50 MG PO TABS: 50.0000 mg | ORAL_TABLET | Freq: Four times a day (QID) | ORAL | 0 refills | Status: DC | PRN

## 2018-06-17 MED ORDER — HEPARIN SOD (PORK) LOCK FLUSH 100 UNIT/ML IV SOLN
500.0000 [IU] | Freq: Once | INTRAVENOUS | Status: AC
  Administered 2018-06-17: 500 [IU] via INTRAVENOUS
  Filled 2018-06-17: qty 5

## 2018-06-17 MED ORDER — SODIUM CHLORIDE 0.9 % IV SOLN
200.0000 mg | Freq: Once | INTRAVENOUS | Status: AC
  Administered 2018-06-17: 200 mg via INTRAVENOUS
  Filled 2018-06-17: qty 8

## 2018-06-17 MED ORDER — SODIUM CHLORIDE 0.9 % IV SOLN
Freq: Once | INTRAVENOUS | Status: AC
  Administered 2018-06-17: 11:00:00 via INTRAVENOUS
  Filled 2018-06-17: qty 250

## 2018-06-17 MED ORDER — SODIUM CHLORIDE 0.9% FLUSH
10.0000 mL | Freq: Once | INTRAVENOUS | Status: AC
  Administered 2018-06-17: 10 mL via INTRAVENOUS
  Filled 2018-06-17: qty 10

## 2018-06-17 MED ORDER — SENNOSIDES-DOCUSATE SODIUM 8.6-50 MG PO TABS: 2.0000 | ORAL_TABLET | Freq: Every day | ORAL | 0 refills | Status: DC

## 2018-06-17 MED ORDER — DRONABINOL 5 MG PO CAPS: 5.0000 mg | ORAL_CAPSULE | Freq: Two times a day (BID) | ORAL | 0 refills | Status: DC

## 2018-06-17 NOTE — Progress Notes (Signed)
Patient reports that she has had a fever, and a few episodes of nausea since her last visit.

## 2018-06-17 NOTE — Progress Notes (Signed)
Hematology/Oncology Follow Up Note [facility]  [phone] [phone]  Patient Care Team: Tonia Ghent, MD as PCP - General (Family Medicine)   Name of the patient: [name]  [id number]  November 08, 1934   REASON FOR VISIT Follow up of chemotherapy management of esophageal adenocarcinoma, discussion of results of interval CT scan.  HISTORY OF PRESENT ILLNESS Patient is a 26-old female who has metastatic esophageal cancer with liver involvement.  Weight loss 20 pounds for the past year prior to diagnosis.  # EGD during admission showed partially obstructive esophageal mass at the distal part of esophagus. Biopsy was taken. Pathology showed esophageal adenocarcinoma. #US biopsy of liver lesion to proof distant metastasis.  Report family history of breast cancer, but she has been having routine mammograms.  # Lives alone. She's a former smoker.  # Molecular Testing:  #[facility] Cdx: No reportable alternations with companion diagnostic (CDx) claims.  MS stable Tumor mutation burden: Cannot be determined, CCND3 amplification: Equivocal.  Amended reports on 06/04/2017, Tumor mutational burden changed from "can not be determined" to "TMB -low" on the re-analyzed samples.  Tumor Proportion Score [TPS] 20%. # HER 2 negative.   Antineoplasm Treatments S/p 6 cycles of FOLFOX, PET scan showed excellent partial response from both chemotherapy and radiation, result was discussed with patient.  Patient was switched to 5-FU maintenance and to September 2019. S/p palliative radiation in January 2019 # April 2019 Developed esophageal stricture secondary to radiation in April and got dilation via endoscopy. EGD shoed Benign-appearing esophageal stenosis. This is a result of radiation therapy.- LA Grade C radiation esophagitis. - The previously noted esophageal mass in Nov 2018 was much decreased indicating good response to radiation/chemotherapy. -  Normal stomach.- Normal examined jejunum.- No specimens collected.   #01/23/2018 PET scan showed interval development of loculated ascites within the upper abdomen with overlies the liver and the spleen and exhibited increased radio tracer uptake.  Worrisome for peritoneal carcinomatosis.  Soft tissue infiltration within the omentum with increased radiotracer uptake noted.  Worrisome for peritoneal carcinomatosis.  New hypermetabolic internal mammary lymph nodes and a right CP angle lymph node.  Suspicious for metastatic adenopathy.  Mild FDG uptake is identified localizing to the lower third of the esophagus raising SUV max of 5.15.  01/26/2018 patient was switched to Taxol and Cyramza treatments.  INTERVAL HISTORY  Patient with above history presents for assessment prior to second line chemotherapy for treatment of metastatic esophageal cancer.  Accompanied by her son.   #Patient is currently on third line treatment for metastatic esophageal adenocarcinoma.  During the interval, she had one ED visit due to profound weakness and fatigue.  Was found to have UTI.  Patient was given a course of Keflex for 7 days.  She also got IV fluid for hydration and symptom improved.  Today patient reports feeling " about the same".  Accompanied by a female friend. Appetite is not good.  She takes Marinol 5 mg daily. Constipation is a chronic problem for her.  On Senokot 8.6-50 mg daily. Reports that abdominal pain is relatively well controlled.  Taking on average tramadol 50 mg once a day. Right now pain level is 0-1 out of 10. Patient has gained 4 pounds weight since 3 weeks ago.   Review of Systems  Constitutional: Positive for fatigue. Negative for chills, fever and unexpected weight change.  HENT:   Negative for hearing loss and voice change.        Chronic sinus  congestion.   Eyes: Negative for eye problems.  Respiratory: Negative for chest tightness and cough.   Cardiovascular: Negative for chest  pain.  Gastrointestinal: Positive for abdominal pain and constipation. Negative for abdominal distention and blood in stool.  Endocrine: Negative for hot flashes.  Genitourinary: Negative for difficulty urinating and frequency.   Musculoskeletal: Negative for arthralgias and flank pain.  Skin: Negative for itching and rash.  Neurological: Positive for numbness. Negative for extremity weakness.  Hematological: Negative for adenopathy.  Psychiatric/Behavioral: Negative for confusion.     Allergies  Allergen Reactions  . Cefuroxime Nausea Only  . Codeine Nausea Only  . Doxycycline Other (See Comments)    Lip swelling  . Gabapentin Other (See Comments)    Swelling.   . Iohexol Itching    07-15-13 pt developed itching on fingers after contrast given. Dr. Irish Elders looked at pt and said to put in system as allergy. BB  . Other Other (See Comments)    Contrast Dye- caused fever, chills, awful feeling Bandages - itching, rash  . Oxycodone Other (See Comments)    nausea  . Quinolones Swelling    Lip swelling  . Synvisc [Hylan G-F 20] Swelling     Past Medical History:  Diagnosis Date  . Arthritis   . Asthma   . COPD (chronic obstructive pulmonary disease) ([place])   . Dysphonia   . Dyspnea   . Esophageal cancer ([place])   . GERD (gastroesophageal reflux disease)   . Heart murmur   . History of kidney stones   . Hypertension   . Hypokalemia 10/07/2017  . Melanoma ([place])   . OAB (overactive bladder)   . OSA on CPAP   . 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 Personal history of chemotherapy    esophageal cancer  . Personal history of radiation therapy    esophageal cancer  . Pupil asymmetry    R pupil defect  . Skin cancer   . Sleep apnea   . Spinal cord abscess      Past Surgical History:  Procedure Laterality Date  . ABDOMINAL HYSTERECTOMY    . ANTERIOR CERVICAL DECOMP/DISCECTOMY FUSION N/A 07/16/2013   Procedure: ANTERIOR CERVICAL DECOMPRESSION/DISCECTOMY  FUSION mutlipleLEVELS C4-7;  Surgeon: Erline Levine, MD;  Location: [place] [facility];  Service: Neurosurgery;  Laterality: N/A;  . APPENDECTOMY    . carpel tunn Bilateral   . CATARACT EXTRACTION    . CATARACT EXTRACTION, BILATERAL    . CHOLECYSTECTOMY    . dental implant    . ESOPHAGOGASTRODUODENOSCOPY Left 03/21/2017   Procedure: ESOPHAGOGASTRODUODENOSCOPY (EGD);  Surgeon: Virgel Manifold, MD;  Location: [facility] ENDOSCOPY;  Service: Endoscopy;  Laterality: Left;  . ESOPHAGOGASTRODUODENOSCOPY (EGD) WITH PROPOFOL N/A 08/20/2017   Procedure: ESOPHAGOGASTRODUODENOSCOPY (EGD) WITH PROPOFOL;  Surgeon: Virgel Manifold, MD;  Location: [facility] ENDOSCOPY;  Service: Endoscopy;  Laterality: N/A;  . ESOPHAGOGASTRODUODENOSCOPY (EGD) WITH PROPOFOL N/A 09/19/2017   Procedure: ESOPHAGOGASTRODUODENOSCOPY (EGD) WITH PROPOFOL;  Surgeon: Virgel Manifold, MD;  Location: [facility] ENDOSCOPY;  Service: Endoscopy;  Laterality: N/A;  . ESOPHAGOGASTRODUODENOSCOPY (EGD) WITH PROPOFOL N/A 10/03/2017   Procedure: ESOPHAGOGASTRODUODENOSCOPY (EGD) WITH PROPOFOL;  Surgeon: Virgel Manifold, MD;  Location: [facility] ENDOSCOPY;  Service: Endoscopy;  Laterality: N/A;  . ESOPHAGOGASTRODUODENOSCOPY (EGD) WITH PROPOFOL N/A 10/22/2017   Procedure: ESOPHAGOGASTRODUODENOSCOPY (EGD) WITH PROPOFOL;  Surgeon: Virgel Manifold, MD;  Location: [facility] ENDOSCOPY;  Service: Endoscopy;  Laterality: N/A;  . ESOPHAGOGASTRODUODENOSCOPY (EGD) WITH PROPOFOL N/A 11/18/2017   Procedure: ESOPHAGOGASTRODUODENOSCOPY (EGD)  WITH PROPOFOL;  Surgeon: Virgel Manifold, MD;  Location: [facility] ENDOSCOPY;  Service: Endoscopy;  Laterality: N/A;  . ESOPHAGOGASTRODUODENOSCOPY (EGD) WITH PROPOFOL N/A 12/17/2017   Procedure: ESOPHAGOGASTRODUODENOSCOPY (EGD) WITH PROPOFOL;  Surgeon: Virgel Manifold, MD;  Location: [facility] ENDOSCOPY;  Service: Endoscopy;  Laterality: N/A;  . ESOPHAGOGASTRODUODENOSCOPY (EGD) WITH PROPOFOL N/A 01/08/2018   Procedure: ESOPHAGOGASTRODUODENOSCOPY  (EGD) WITH PROPOFOL;  Surgeon: Virgel Manifold, MD;  Location: [facility] ENDOSCOPY;  Service: Endoscopy;  Laterality: N/A;  . ESOPHAGOGASTRODUODENOSCOPY (EGD) WITH PROPOFOL N/A 01/22/2018   Procedure: ESOPHAGOGASTRODUODENOSCOPY (EGD) WITH PROPOFOL;  Surgeon: Virgel Manifold, MD;  Location: [facility] ENDOSCOPY;  Service: Endoscopy;  Laterality: N/A;  . EYE SURGERY    . GASTRIC BYPASS    . HERNIA REPAIR    . HIATAL HERNIA REPAIR    . JOINT REPLACEMENT    . MELANOMA EXCISION    . PORTA CATH INSERTION N/A 04/07/2017   Procedure: PORTA CATH INSERTION;  Surgeon: Algernon Huxley, MD;  Location: [place] CV LAB;  Service: Cardiovascular;  Laterality: N/A;  . POSTERIOR CERVICAL FUSION/FORAMINOTOMY N/A 07/28/2013   Procedure: Cervical four-seven  posterior cervical fusion;  Surgeon: Erline Levine, MD;  Location: [place] [facility]UR[facility]S;  Service: Neurosurgery;  Laterality: N/A;  . TONSILLECTOMY    . TOTAL KNEE ARTHROPLASTY      Social History   Socioeconomic History  . Marital status: Widowed    Spouse name: Not on file  . Number of children: 2  . Years of education: Not on file  . Highest education level: Master's degree (e.g., MA, MS, MEng, MEd, MSW, MBA)  Occupational History  . Not on file  Social Needs  . Financial resource strain: Not hard at all  . Food insecurity:    Worry: Never true    Inability: Never true  . Transportation needs:    Medical: No    Non-medical: No  Tobacco Use  . Smoking status: Former Smoker    Packs/day: 1.00    Years: 20.00    Pack years: 20.00    Types: Cigarettes    Last attempt to quit: 1977    Years since quitting: 43.1  . Smokeless tobacco: Never Used  Substance and Sexual Activity  . Alcohol use: No  . Drug use: Never  . Sexual activity: Not Currently  Lifestyle  . Physical activity:    Days per week: 2 days    Minutes per session: 30 min  . Stress: Not at all  Relationships  . Social connections:    Talks on phone: More than three times a week     Gets together: More than three times a week    Attends religious service: More than 4 times per year    Active member of club or organization: Yes    Attends meetings of clubs or organizations: More than 4 times per year    Relationship status: Widowed  . Intimate partner violence:    Fear of current or ex partner: No    Emotionally abused: No    Physically abused: No    Forced sexual activity: No  Other Topics Concern  . Not on file  Social History Narrative   Marital Status- Widowed    Lives by herself    Employement- Retired Pharmacist, hospital   Exercise hx- Does PT     Family History  Problem Relation Age of Onset  . Breast cancer Mother 59  . Arthritis-Osteo Mother   . Breast cancer Sister 84  . Bladder Cancer  Sister   . Bladder Cancer Father   . Tongue cancer Father   . Congenital heart disease Father   . Prostate cancer Brother   . Uterine cancer Maternal Aunt   . Lung cancer Paternal Uncle   . Leukemia Maternal Grandmother   . Liver cancer Paternal Grandmother   . Colon cancer Neg Hx      Current Outpatient Medications:  .  albuterol (PROVENTIL HFA;VENTOLIN HFA) 108 (90 Base) MCG/ACT inhaler, Inhale 2 puffs into the lungs every 4 (four) hours as needed for wheezing or shortness of breath., Disp: , Rfl:  .  calcium carbonate (TUMS EX) 750 MG chewable tablet, Chew 1 tablet by mouth as needed for heartburn. , Disp: , Rfl:  .  chlorhexidine (PERIDEX) 0.12 % solution, Use as directed 15 mLs in the mouth or throat 2 (two) times daily., Disp: , Rfl:  .  Cholecalciferol (VITAMIN D) 2000 UNITS CAPS, Take 2,000 Units by mouth daily., Disp: , Rfl:  .  cyanocobalamin 500 MCG tablet, Take 500 mcg every other day by mouth., Disp: , Rfl:  .  Diclofenac Sodium 1 % CREA, Apply to left arm as needed for pain three times daily, Disp: 120 g, Rfl: 0 .  dronabinol (MARINOL) 5 MG capsule, Take 1 capsule (5 mg total) by mouth 2 (two) times daily before a meal., Disp: 60 capsule, Rfl: 0 .   fluticasone (FLONASE) 50 MCG/ACT nasal spray, Place 1-2 sprays into both nostrils daily., Disp: 16 g, Rfl: 5 .  furosemide (LASIX) 20 MG tablet, TAKE 1-2 TABLETS BY MOUTH DAILY AS NEEDED FOR EDEMA, Disp: 180 tablet, Rfl: 1 .  hydrocortisone cream 1 %, Apply 1 application topically daily as needed for itching. , Disp: , Rfl:  .  loratadine (CLARITIN) 10 MG tablet, Take 10 mg by mouth daily., Disp: , Rfl:  .  metoprolol succinate (TOPROL-XL) 50 MG 24 hr tablet, TAKE 1 TABLET BY MOUTH DAILY, Disp: 90 tablet, Rfl: 1 .  mirabegron ER (MYRBETRIQ) 25 MG TB24 tablet, Take 1 tablet (25 mg total) by mouth daily., Disp: 30 tablet, Rfl: 2 .  Multiple Vitamins-Minerals (MULTIVITAMIN ADULT) CHEW, Chew 1 tablet by mouth daily. , Disp: , Rfl:  .  ondansetron (ZOFRAN) 8 MG tablet, Take 1 tablet (8 mg total) by mouth 2 (two) times daily as needed (Nausea or vomiting)., Disp: 30 tablet, Rfl: 1 .  pantoprazole (PROTONIX) 40 MG tablet, Take 1 tablet (40 mg total) by mouth 2 (two) times daily before a meal., Disp: , Rfl:  .  Polyethyl Glycol-Propyl Glycol (SYSTANE OP), Apply 1 drop to eye daily as needed (dry eyes)., Disp: , Rfl:  .  potassium chloride (KLOR-CON) 20 MEQ packet, Take 20 mEq by mouth daily., Disp: 30 packet, Rfl: 1 .  pregabalin (LYRICA) 150 MG capsule, Take 1 capsule (150 mg total) by mouth 2 (two) times daily., Disp: , Rfl:  .  prochlorperazine (COMPAZINE) 10 MG tablet, Take 1 tablet (10 mg total) by mouth every 6 (six) hours as needed (Nausea or vomiting)., Disp: 30 tablet, Rfl: 1 .  pyridOXINE (VITAMIN B-6) 100 MG tablet, Take 1 tablet (100 mg total) by mouth daily., Disp: 30 tablet, Rfl: 3 .  senna-docusate (SENOKOT-S) 8.6-50 MG tablet, Take 1 tablet at bedtime as needed by mouth for mild constipation., Disp: , Rfl:  .  sucralfate (CARAFATE) 1 GM/10ML suspension, Take 10 mLs (1 g total) by mouth 4 (four) times daily., Disp: , Rfl:  .  traMADol (ULTRAM) 50 MG  tablet, Take 1 tablet (50 mg total) by mouth  every 6 (six) hours as needed., Disp: 30 tablet, Rfl: 1 .  traZODone (DESYREL) 50 MG tablet, Take 0.5-1 tablets (25-50 mg total) by mouth at bedtime as needed for sleep., Disp: , Rfl:  No current facility-administered medications for this visit.   Facility-Administered Medications Ordered in Other Visits:  .  heparin lock flush 100 unit/mL, 500 Units, Intravenous, Once, Earlie Server, MD .  sodium chloride flush (NS) 0.9 % injection 10 mL, 10 mL, Intravenous, PRN, Earlie Server, MD, 10 mL at 01/26/18 0904 .  sodium chloride flush (NS) 0.9 % injection 10 mL, 10 mL, Intravenous, Once, Earlie Server, MD  Physical exam:  Vitals:   06/17/18 0944 06/17/18 0953  BP:  134/79  Pulse:  93  Resp: 16   Temp:  97.7 F (36.5 C)  TempSrc:  Tympanic  Weight: 199 lb 1.6 oz (90.3 kg)   Height: '5\' 2"'$  (1.575 m)   ECOG 2 Physical Exam  Constitutional: She is oriented to person, place, and time. No distress.  HENT:  Head: Normocephalic and atraumatic.  Nose: Nose normal.  Mouth/Throat: Oropharynx is clear and moist. No oropharyngeal exudate.  Eyes: Pupils are equal, round, and reactive to light. Conjunctivae and EOM are normal. No scleral icterus.  Neck: Normal range of motion. Neck supple. No JVD present.  Cardiovascular: Normal rate and regular rhythm.  Murmur heard. Pulmonary/Chest: Effort normal and breath sounds normal. No respiratory distress. She has no wheezes. She has no rales. She exhibits no tenderness.  Abdominal: Soft. Bowel sounds are normal. She exhibits no distension. There is no abdominal tenderness.  Musculoskeletal: Normal range of motion.        General: No tenderness, deformity or edema.  Lymphadenopathy:    She has no cervical adenopathy.  Neurological: She is alert and oriented to person, place, and time. No cranial nerve deficit. She exhibits normal muscle tone. Coordination normal.  Skin: Skin is warm and dry. She is not diaphoretic. No erythema.  Psychiatric: Affect and judgment normal.      CMP Latest Ref Rng & Units 06/08/2018  Glucose 70 - 99 mg/dL 107(H)  BUN 8 - 23 mg/dL 20  Creatinine 0.44 - 1.00 mg/dL 0.96  Sodium 135 - 145 mmol/L 134(L)  Potassium 3.5 - 5.1 mmol/L 4.6  Chloride 98 - 111 mmol/L 97(L)  CO2 22 - 32 mmol/L 30  Calcium 8.9 - 10.3 mg/dL 8.3(L)  Total Protein 6.5 - 8.1 g/dL 6.6  Total Bilirubin 0.3 - 1.2 mg/dL 0.7  Alkaline Phos 38 - 126 U/L 68  AST 15 - 41 U/L 25  ALT 0 - 44 U/L 11   CBC Latest Ref Rng & Units 06/08/2018  WBC 4.0 - 10.5 K/uL 10.3  Hemoglobin 12.0 - 15.0 g/dL 11.7(L)  Hematocrit 36.0 - 46.0 % 38.0  Platelets 150 - 400 K/uL 329   RADIOGRAPHIC STUDIES: I have personally reviewed the radiological images as listed and agreed with the findings in the report.  PET scan 01/23/2018  Interval development of loculated ascites within the upper abdomen which overlies the liver and spleen and exhibits increased radiotracer uptake. Findings worrisome for peritoneal carcinomatosis. Consider further evaluation with diagnostic paracentesis. 2. Soft tissue infiltration within the omentum with increased radiotracer uptake noted. Also worrisome for peritoneal carcinomatosis. 3. New hypermetabolic internal mammary lymph nodes and right CP angle lymph node. Suspicious for metastatic adenopathy. 4. Mild FDG uptake is identified localizing to the lower third of the  esophagus within SUV max of 5.15.  Bone Scan 05/08/2018  1. Several small foci of activity in anterior ribs as noted above, and metastatic involvement can not be excluded. 2. Somewhat mottled activity within the midthoracic and upper lumbar spine and metastatic involvement can not be excluded. Correlate with plain films or MRI preferably.  Assessment and plan  Cancer Staging Malignant neoplasm of lower third of esophagus [facility]) Staging form: Esophagus - Adenocarcinoma, AJCC 8th Edition - Clinical stage from 04/10/2017: Stage IVB (cTX, cNX, pM1) - Signed by Earlie Server, MD on 04/10/2017  1.  Esophageal adenocarcinoma ([place])   2. Bone metastasis ([place])   3. Encounter for antineoplastic immunotherapy   4. Elevated TSH    #Esophageal cancer, metastatic Currently on third line treatment with immunotherapy Keytruda. TPS 20% Tolerating well.  Labs are reviewed and discussed. Proceed with cycle 2 Keytruda.   Baseline TSH elevated.  Free T4 normal. Repeat TSH in 3 weeks.   # Abdominal pain, on Tramadol 16m Q6h PRN. Pain is well controlled.  # Depression/insominia, continue Trazodone 542mQHS.  # Bone metastasis, discussed about obtaining dental clearance for Zometa.  # Constipation, continue Senokot 2 tabs daily.  # Weight loss, recommend patient to use Marinol 25m225mID instead of daily.   Follow-up in 3 weeks.  We spent sufficient time to discuss many aspect of care, questions were answered to patient's satisfaction. Total face to face encounter time for this patient visit was 25 min. >50% of the time was  spent in counseling and coordination of care.    ZhoEarlie ServerD, PhD Hematology Oncology [facility] [id number]/05/20

## 2018-06-18 LAB — CEA: CEA: 2 ng/mL (ref 0.0–4.7)

## 2018-07-03 ENCOUNTER — Other Ambulatory Visit: Payer: Self-pay | Admitting: Family Medicine

## 2018-07-06 ENCOUNTER — Other Ambulatory Visit: Payer: Self-pay

## 2018-07-06 ENCOUNTER — Encounter: Payer: Self-pay | Admitting: Gastroenterology

## 2018-07-06 ENCOUNTER — Ambulatory Visit: Payer: Medicare Other | Admitting: Gastroenterology

## 2018-07-06 VITALS — BP 112/70 | HR 71 | Ht 62.0 in | Wt 202.2 lb

## 2018-07-06 DIAGNOSIS — K222 Esophageal obstruction: Secondary | ICD-10-CM | POA: Diagnosis not present

## 2018-07-06 NOTE — Progress Notes (Signed)
[name] Antigua, MD [address]  [place]  [place],  [address]  Main: [phone]  Fax: [phone]   Primary Care Physician: Tonia Ghent, MD   Chief Complaint  Patient presents with  . Follow-up    Esophageal adenocarcinoma, dysphagia    HPI: [name] is a 84 y.o. female with history of metastatic esophageal carcinoma with radiation-induced esophageal stricture here for follow-up.  Patient was referred to [facility] due to recurrent esophageal stricture despite steroid injections and dilation multiple times.  [facility] referral was to evaluate for esophageal stent placement.  Patient states she had a recent UTI and could not go to do.  In addition she is hesitant to make that appointment as she is not interested in stent placement and states her swallowing is better and she as of recently ate steak without difficulty.  No nausea or vomiting or episodes of food impaction.  She has even been swallowing multiple pills at the same time without an impaction.  Current Outpatient Medications  Medication Sig Dispense Refill  . albuterol (PROVENTIL HFA;VENTOLIN HFA) 108 (90 Base) MCG/ACT inhaler Inhale 2 puffs into the lungs every 4 (four) hours as needed for wheezing or shortness of breath.    . calcium carbonate (TUMS EX) 750 MG chewable tablet Chew 1 tablet by mouth as needed for heartburn.     . chlorhexidine (PERIDEX) 0.12 % solution Use as directed 15 mLs in the mouth or throat 2 (two) times daily.    . Cholecalciferol (VITAMIN D) 2000 UNITS CAPS Take 2,000 Units by mouth daily.    . cyanocobalamin 500 MCG tablet Take 500 mcg every other day by mouth.    . Diclofenac Sodium 1 % CREA Apply to left arm as needed for pain three times daily 120 g 0  . dronabinol (MARINOL) 5 MG capsule Take 1 capsule (5 mg total) by mouth 2 (two) times daily before a meal. 60 capsule 0  . fluticasone (FLONASE) 50 MCG/ACT nasal spray Place 1-2 sprays into both nostrils daily. 16 g 5  .  furosemide (LASIX) 20 MG tablet TAKE 1-2 TABLETS BY MOUTH DAILY AS NEEDED FOR EDEMA 180 tablet 1  . hydrocortisone cream 1 % Apply 1 application topically daily as needed for itching.     . loratadine (CLARITIN) 10 MG tablet Take 10 mg by mouth daily.    . metoprolol succinate (TOPROL-XL) 50 MG 24 hr tablet TAKE 1 TABLET BY MOUTH DAILY 90 tablet 1  . Multiple Vitamins-Minerals (MULTIVITAMIN ADULT) CHEW Chew 1 tablet by mouth daily.     Marland Kitchen MYRBETRIQ 25 MG TB24 tablet TAKE ONE TABLET EVERY DAY 30 tablet 2  . ondansetron (ZOFRAN) 8 MG tablet Take 1 tablet (8 mg total) by mouth 2 (two) times daily as needed (Nausea or vomiting). 30 tablet 1  . pantoprazole (PROTONIX) 40 MG tablet Take 1 tablet (40 mg total) by mouth 2 (two) times daily before a meal.    . Polyethyl Glycol-Propyl Glycol (SYSTANE OP) Apply 1 drop to eye daily as needed (dry eyes).    . potassium chloride (KLOR-CON) 20 MEQ packet Take 20 mEq by mouth daily. 30 packet 1  . pregabalin (LYRICA) 150 MG capsule Take 1 capsule (150 mg total) by mouth 2 (two) times daily.    . prochlorperazine (COMPAZINE) 10 MG tablet Take 1 tablet (10 mg total) by mouth every 6 (six) hours as needed (Nausea or vomiting). 30 tablet 1  . pyridOXINE (VITAMIN B-6) 100 MG  tablet Take 1 tablet (100 mg total) by mouth daily. 30 tablet 3  . senna-docusate (SENOKOT-S) 8.6-50 MG tablet Take 2 tablets by mouth daily. 60 tablet 0  . sucralfate (CARAFATE) 1 GM/10ML suspension Take 10 mLs (1 g total) by mouth 4 (four) times daily.    . traMADol (ULTRAM) 50 MG tablet Take 1 tablet (50 mg total) by mouth every 6 (six) hours as needed. 60 tablet 0  . traZODone (DESYREL) 50 MG tablet Take 0.5-1 tablets (25-50 mg total) by mouth at bedtime as needed for sleep.     No current facility-administered medications for this visit.    Facility-Administered Medications Ordered in Other Visits  Medication Dose Route Frequency Provider Last Rate Last Dose  . sodium chloride flush (NS)  0.9 % injection 10 mL  10 mL Intravenous PRN Earlie Server, MD   10 mL at 01/26/18 0904    Allergies as of 07/06/2018 - Review Complete 07/06/2018  Allergen Reaction Noted  . Cefuroxime Nausea Only 07/11/2015  . Codeine Nausea Only 01/22/2011  . Doxycycline Other (See Comments) 12/03/2013  . Gabapentin Other (See Comments) 12/19/2016  . Iohexol Itching 07/15/2013  . Other Other (See Comments) 12/03/2013  . Oxycodone Other (See Comments) 12/19/2016  . Quinolones Swelling 12/03/2013  . Synvisc [hylan g-f 20] Swelling 01/22/2011    ROS:  General: Negative for anorexia, weight loss, fever, chills, fatigue, weakness. ENT: Negative for hoarseness, difficulty swallowing , nasal congestion. CV: Negative for chest pain, angina, palpitations, dyspnea on exertion, peripheral edema.  Respiratory: Negative for dyspnea at rest, dyspnea on exertion, cough, sputum, wheezing.  GI: See history of present illness. GU:  Negative for dysuria, hematuria, urinary incontinence, urinary frequency, nocturnal urination.  Endo: Negative for unusual weight change.    Physical Examination:   BP 112/70   Pulse 71   Ht 5\' 2"  (1.575 m)   Wt 202 lb 3.2 oz (91.7 kg)   BMI 36.98 kg/m   General: Well-nourished, well-developed in no acute distress.  Eyes: No icterus. Conjunctivae pink. Mouth: Oropharyngeal mucosa moist and pink , no lesions erythema or exudate. Neck: Supple, Trachea midline Abdomen: Bowel sounds are normal, nontender, nondistended, no hepatosplenomegaly or masses, no abdominal bruits or hernia , no rebound or guarding.   Extremities: No lower extremity edema. No clubbing or deformities. Neuro: Alert and oriented x 3.  Grossly intact. Skin: Warm and dry, no jaundice.   Psych: Alert and cooperative, normal mood and affect.   Labs: CMP     Component Value Date/Time   NA 137 06/17/2018 0932   NA 138 07/13/2013 0609   K 3.6 06/17/2018 0932   K 4.1 07/13/2013 0609   CL 102 06/17/2018 0932   CL  104 07/13/2013 0609   CO2 28 06/17/2018 0932   CO2 29 07/13/2013 0609   GLUCOSE 147 (H) 06/17/2018 0932   GLUCOSE 115 (H) 07/13/2013 0609   BUN 13 06/17/2018 0932   BUN 15 07/13/2013 0609   CREATININE 0.86 06/17/2018 0932   CREATININE 0.67 07/13/2013 0609   CALCIUM 8.3 (L) 06/17/2018 0932   CALCIUM 8.3 (L) 07/13/2013 0609   PROT 6.9 06/17/2018 0932   ALBUMIN 2.7 (L) 06/17/2018 0932   AST 29 06/17/2018 0932   ALT 12 06/17/2018 0932   ALKPHOS 78 06/17/2018 0932   BILITOT 0.3 06/17/2018 0932   GFRNONAA >60 06/17/2018 0932   GFRNONAA >60 07/13/2013 0609   GFRAA >60 06/17/2018 0932   GFRAA >60 07/13/2013 5465   Lab Results  Component Value Date   WBC 10.1 06/17/2018   HGB 11.8 (L) 06/17/2018   HCT 38.4 06/17/2018   MCV 99.7 06/17/2018   PLT 401 (H) 06/17/2018    Imaging Studies: Dg Abdomen 1 View  Result Date: 06/08/2018 CLINICAL DATA:  No bowel movement for 1 week. Currently being treated for esophageal cancer. EXAM: ABDOMEN - 1 VIEW COMPARISON:  05/12/2018 CT FINDINGS: There is a nonspecific bowel gas pattern with scattered air containing small large bowel loops noted but without obstructive appearance. Average amount of fecal retention is seen within the ascending and proximal descending colon. No significant stool burden is identified. No free air is noted. Thoracolumbar degenerative change consistent with spondylosis is noted. No acute nor aggressive osseous abnormality is seen. IMPRESSION: Nonspecific bowel gas pattern without obstructive appearance. No significant stool retention. Electronically Signed   By: Ashley Royalty M.D.   On: 06/08/2018 19:22    Assessment and Plan:   AVAMAE DEHAAN is a 84 y.o. y/o female with history of radiation-induced esophageal stricture with history of metastatic esophageal adenocarcinoma here for follow-up  Patient is hesitant to make [place] appointment as she states she is not interested in esophageal stent placement I reassured her that her  first appointment would just be to discuss the details of esophageal stenting and risks and benefits.  However she is hesitant at this time and would rather continue esophageal dilation as needed as she states her swallowing is better.  We discussed risks and benefits of repeat dilation including risks of perforation. Since her swallowing is better we can proceed with EGD at this time to evaluate the tightness of the stricture and if still narrowed, can dilate with balloon dilation during the procedure. However, I have discussed that if the stricture remains tight as it has before, I would still recommend that she see [facility] to discuss stent placement given the refractory stricture.  She is agreeable to the above.  I have discussed alternative options, risks & benefits,  which include, but are not limited to, bleeding, infection, perforation,respiratory complication & drug reaction.  The patient agrees with this plan & written consent will be obtained.    She is on senna for constipation and this is working well but she states she has small bowel movements.  I have asked her to start taking MiraLAX daily as well.    Dr [name]

## 2018-07-06 NOTE — Patient Instructions (Signed)
-   Miralax daily

## 2018-07-07 ENCOUNTER — Telehealth: Payer: Self-pay | Admitting: Family Medicine

## 2018-07-07 NOTE — Telephone Encounter (Signed)
Please call and triage patient about dysuria, may need OV/repeat u/a.  Thanks.

## 2018-07-07 NOTE — Telephone Encounter (Signed)
Noted.  Thanks.  I will defer. 

## 2018-07-07 NOTE — Telephone Encounter (Signed)
I spoke with pt and she gave phone to Yvone Neu; pt having problem with burning and pain upon urination; cognitive changes and Yvone Neu said pt has appt with Dr Tasia Catchings on 07/08/18 and will have Dr Rudi Rummage U/A. Yvone Neu declines appt at [facility] at this time and Yvone Neu will cb if needed. FYI to Dr Damita Dunnings.

## 2018-07-08 ENCOUNTER — Inpatient Hospital Stay (HOSPITAL_BASED_OUTPATIENT_CLINIC_OR_DEPARTMENT_OTHER): Payer: Medicare Other | Admitting: Oncology

## 2018-07-08 ENCOUNTER — Inpatient Hospital Stay: Payer: Medicare Other

## 2018-07-08 ENCOUNTER — Other Ambulatory Visit: Payer: Self-pay

## 2018-07-08 ENCOUNTER — Encounter: Payer: Self-pay | Admitting: Oncology

## 2018-07-08 VITALS — BP 126/60 | HR 70 | Temp 97.9°F | Resp 18 | Wt 202.9 lb

## 2018-07-08 DIAGNOSIS — C7951 Secondary malignant neoplasm of bone: Secondary | ICD-10-CM

## 2018-07-08 DIAGNOSIS — G629 Polyneuropathy, unspecified: Secondary | ICD-10-CM

## 2018-07-08 DIAGNOSIS — Z5112 Encounter for antineoplastic immunotherapy: Secondary | ICD-10-CM

## 2018-07-08 DIAGNOSIS — Z87440 Personal history of urinary (tract) infections: Secondary | ICD-10-CM

## 2018-07-08 DIAGNOSIS — R634 Abnormal weight loss: Secondary | ICD-10-CM

## 2018-07-08 DIAGNOSIS — M1990 Unspecified osteoarthritis, unspecified site: Secondary | ICD-10-CM

## 2018-07-08 DIAGNOSIS — M7989 Other specified soft tissue disorders: Secondary | ICD-10-CM

## 2018-07-08 DIAGNOSIS — J449 Chronic obstructive pulmonary disease, unspecified: Secondary | ICD-10-CM

## 2018-07-08 DIAGNOSIS — R300 Dysuria: Secondary | ICD-10-CM

## 2018-07-08 DIAGNOSIS — C155 Malignant neoplasm of lower third of esophagus: Secondary | ICD-10-CM

## 2018-07-08 DIAGNOSIS — I10 Essential (primary) hypertension: Secondary | ICD-10-CM

## 2018-07-08 DIAGNOSIS — N3281 Overactive bladder: Secondary | ICD-10-CM

## 2018-07-08 DIAGNOSIS — N3000 Acute cystitis without hematuria: Secondary | ICD-10-CM

## 2018-07-08 DIAGNOSIS — R109 Unspecified abdominal pain: Secondary | ICD-10-CM

## 2018-07-08 DIAGNOSIS — Z923 Personal history of irradiation: Secondary | ICD-10-CM

## 2018-07-08 DIAGNOSIS — Z87891 Personal history of nicotine dependence: Secondary | ICD-10-CM

## 2018-07-08 DIAGNOSIS — F32A Depression, unspecified: Secondary | ICD-10-CM

## 2018-07-08 DIAGNOSIS — G4733 Obstructive sleep apnea (adult) (pediatric): Secondary | ICD-10-CM

## 2018-07-08 DIAGNOSIS — Z9221 Personal history of antineoplastic chemotherapy: Secondary | ICD-10-CM | POA: Diagnosis not present

## 2018-07-08 DIAGNOSIS — C787 Secondary malignant neoplasm of liver and intrahepatic bile duct: Secondary | ICD-10-CM

## 2018-07-08 DIAGNOSIS — Z85820 Personal history of malignant melanoma of skin: Secondary | ICD-10-CM

## 2018-07-08 DIAGNOSIS — F329 Major depressive disorder, single episode, unspecified: Secondary | ICD-10-CM

## 2018-07-08 DIAGNOSIS — K219 Gastro-esophageal reflux disease without esophagitis: Secondary | ICD-10-CM

## 2018-07-08 DIAGNOSIS — Z9989 Dependence on other enabling machines and devices: Secondary | ICD-10-CM

## 2018-07-08 DIAGNOSIS — Z79899 Other long term (current) drug therapy: Secondary | ICD-10-CM

## 2018-07-08 DIAGNOSIS — Z803 Family history of malignant neoplasm of breast: Secondary | ICD-10-CM

## 2018-07-08 DIAGNOSIS — R600 Localized edema: Secondary | ICD-10-CM

## 2018-07-08 DIAGNOSIS — C159 Malignant neoplasm of esophagus, unspecified: Secondary | ICD-10-CM

## 2018-07-08 DIAGNOSIS — G4709 Other insomnia: Secondary | ICD-10-CM

## 2018-07-08 DIAGNOSIS — Z9884 Bariatric surgery status: Secondary | ICD-10-CM

## 2018-07-08 LAB — URINALYSIS, COMPLETE (UACMP) WITH MICROSCOPIC
Bilirubin Urine: NEGATIVE
Glucose, UA: NEGATIVE mg/dL
Hgb urine dipstick: NEGATIVE
Ketones, ur: NEGATIVE mg/dL
Nitrite: NEGATIVE
PROTEIN: NEGATIVE mg/dL
Specific Gravity, Urine: 1.006 (ref 1.005–1.030)
WBC, UA: >50 WBC/hpf — ABNORMAL HIGH (ref 0–5)
pH: 7 (ref 5.0–8.0)

## 2018-07-08 LAB — CBC WITH DIFFERENTIAL/PLATELET
Abs Immature Granulocytes: 0.04 10*3/uL (ref 0.00–0.07)
BASOS ABS: 0.1 10*3/uL (ref 0.0–0.1)
Basophils Relative: 1 %
EOS ABS: 0.2 10*3/uL (ref 0.0–0.5)
Eosinophils Relative: 2 %
HCT: 37.3 % (ref 36.0–46.0)
Hemoglobin: 11.7 g/dL — ABNORMAL LOW (ref 12.0–15.0)
Immature Granulocytes: 1 %
Lymphocytes Relative: 12 %
Lymphs Abs: 0.9 10*3/uL (ref 0.7–4.0)
MCH: 30.3 pg (ref 26.0–34.0)
MCHC: 31.4 g/dL (ref 30.0–36.0)
MCV: 96.6 fL (ref 80.0–100.0)
Monocytes Absolute: 0.7 10*3/uL (ref 0.1–1.0)
Monocytes Relative: 10 %
NRBC: 0 % (ref 0.0–0.2)
Neutro Abs: 5.2 10*3/uL (ref 1.7–7.7)
Neutrophils Relative %: 74 %
Platelets: 266 10*3/uL (ref 150–400)
RBC: 3.86 MIL/uL — ABNORMAL LOW (ref 3.87–5.11)
RDW: 16.9 % — ABNORMAL HIGH (ref 11.5–15.5)
WBC: 7 10*3/uL (ref 4.0–10.5)

## 2018-07-08 LAB — COMPREHENSIVE METABOLIC PANEL
ALT: 15 U/L (ref 0–44)
AST: 33 U/L (ref 15–41)
Albumin: 2.9 g/dL — ABNORMAL LOW (ref 3.5–5.0)
Alkaline Phosphatase: 96 U/L (ref 38–126)
Anion gap: 8 (ref 5–15)
BILIRUBIN TOTAL: 0.6 mg/dL (ref 0.3–1.2)
BUN: 16 mg/dL (ref 8–23)
CO2: 30 mmol/L (ref 22–32)
Calcium: 8.4 mg/dL — ABNORMAL LOW (ref 8.9–10.3)
Chloride: 101 mmol/L (ref 98–111)
Creatinine, Ser: 0.97 mg/dL (ref 0.44–1.00)
GFR calc Af Amer: >60 mL/min (ref >60)
GFR calc non Af Amer: 55 mL/min — ABNORMAL LOW (ref >60)
Glucose, Bld: 98 mg/dL (ref 70–99)
Potassium: 4.1 mmol/L (ref 3.5–5.1)
Sodium: 139 mmol/L (ref 135–145)
Total Protein: 6.9 g/dL (ref 6.5–8.1)

## 2018-07-08 LAB — TSH: TSH: 2.501 u[IU]/mL (ref 0.350–4.500)

## 2018-07-08 MED ORDER — HEPARIN SOD (PORK) LOCK FLUSH 100 UNIT/ML IV SOLN
INTRAVENOUS | Status: AC
  Filled 2018-07-08: qty 5

## 2018-07-08 MED ORDER — ONDANSETRON HCL 8 MG PO TABS: 8.0000 mg | ORAL_TABLET | Freq: Two times a day (BID) | ORAL | 3 refills | Status: DC | PRN

## 2018-07-08 MED ORDER — SODIUM CHLORIDE 0.9 % IV SOLN
Freq: Once | INTRAVENOUS | Status: AC
  Administered 2018-07-08: 11:00:00 via INTRAVENOUS
  Filled 2018-07-08: qty 250

## 2018-07-08 MED ORDER — CEPHALEXIN 500 MG PO CAPS: 500.0000 mg | ORAL_CAPSULE | Freq: Four times a day (QID) | ORAL | 0 refills | Status: DC

## 2018-07-08 MED ORDER — SODIUM CHLORIDE 0.9 % IV SOLN
200.0000 mg | Freq: Once | INTRAVENOUS | Status: AC
  Administered 2018-07-08: 200 mg via INTRAVENOUS
  Filled 2018-07-08: qty 8

## 2018-07-08 MED ORDER — HEPARIN SOD (PORK) LOCK FLUSH 100 UNIT/ML IV SOLN
500.0000 [IU] | Freq: Once | INTRAVENOUS | Status: AC
  Administered 2018-07-08: 500 [IU] via INTRAVENOUS

## 2018-07-08 MED ORDER — SODIUM CHLORIDE 0.9% FLUSH
10.0000 mL | Freq: Once | INTRAVENOUS | Status: AC
  Administered 2018-07-08: 10 mL via INTRAVENOUS
  Filled 2018-07-08: qty 10

## 2018-07-08 NOTE — Progress Notes (Addendum)
Hematology/Oncology Follow Up Note [facility]  [phone] [phone]  Patient Care Team: Tonia Ghent, MD as PCP - General (Family Medicine)   Name of the patient: [name]  [id number]  Mar 22, 1935   REASON FOR VISIT Follow up of chemotherapy management of esophageal adenocarcinoma, discussion of results of interval CT scan.  HISTORY OF PRESENT ILLNESS Patient is a 40-old female who has metastatic esophageal cancer with liver involvement.  Weight loss 20 pounds for the past year prior to diagnosis.  # EGD during admission showed partially obstructive esophageal mass at the distal part of esophagus. Biopsy was taken. Pathology showed esophageal adenocarcinoma. #US biopsy of liver lesion to proof distant metastasis.  Report family history of breast cancer, but she has been having routine mammograms.  # Lives alone. She's a former smoker.  # Molecular Testing:  #[employer] Cdx: No reportable alternations with companion diagnostic (CDx) claims.  MS stable Tumor mutation burden: Cannot be determined, CCND3 amplification: Equivocal.  Amended reports on 06/04/2017, Tumor mutational burden changed from "can not be determined" to "TMB -low" on the re-analyzed samples.  Tumor Proportion Score [TPS] 20%. # HER 2 negative.   Antineoplasm Treatments S/p 6 cycles of FOLFOX, PET scan showed excellent partial response from both chemotherapy and radiation, result was discussed with patient.  Patient was switched to 5-FU maintenance and to September 2019. S/p palliative radiation in January 2019 # April 2019 Developed esophageal stricture secondary to radiation in April and got dilation via endoscopy. EGD shoed Benign-appearing esophageal stenosis. This is a result of radiation therapy.- LA Grade C radiation esophagitis. - The previously noted esophageal mass in Nov 2018 was much decreased indicating good response to radiation/chemotherapy. -  Normal stomach.- Normal examined jejunum.- No specimens collected.   #01/23/2018 PET scan showed interval development of loculated ascites within the upper abdomen with overlies the liver and the spleen and exhibited increased radio tracer uptake.  Worrisome for peritoneal carcinomatosis.  Soft tissue infiltration within the omentum with increased radiotracer uptake noted.  Worrisome for peritoneal carcinomatosis.  New hypermetabolic internal mammary lymph nodes and a right CP angle lymph node.  Suspicious for metastatic adenopathy.  Mild FDG uptake is identified localizing to the lower third of the esophagus raising SUV max of 5.15.  01/26/2018 patient was switched to Taxol and Cyramza treatments.  INTERVAL HISTORY  Patient with above history presents for assessment prior to second line chemotherapy for treatment of metastatic esophageal cancer.  Accompanied by her sister   #Patient is currently on third line treatment for metastatic esophageal adenocarcinoma.  Tolerates well.  Reports dysphagia symptoms have improved. Follows up with GI.  Fatigue has improved. Appetite is good, like tomato soup.   She wants to have urine tested as family is worried that she may have UTI. She sometimes forgets the topics she was talking, "sundowner". Denies any focal weakness, facial droop, slurr speech.  With further questioning, she endorses intermittent dysuria, increased frequency. Not associated with fever or chills.  Weight has been stable. Takes Marinol 26m BID.   Lower extremities edema have gotten worse during the interval, worse in the evening and better in the morning.  No calf tenderness.  Chronic neuropathy, unchanged.  Abdominal pain has improved.    Review of Systems  Constitutional: Negative for chills, fatigue, fever and unexpected weight change.  HENT:   Negative for hearing loss and voice change.   Eyes: Negative for eye problems.  Respiratory: Negative for chest tightness and  cough.     Cardiovascular: Positive for leg swelling. Negative for chest pain.  Gastrointestinal: Positive for constipation. Negative for abdominal distention, abdominal pain and blood in stool.  Endocrine: Negative for hot flashes.  Genitourinary: Positive for dysuria and frequency. Negative for difficulty urinating.   Musculoskeletal: Negative for arthralgias and flank pain.  Skin: Negative for itching and rash.  Neurological: Positive for numbness. Negative for extremity weakness.  Hematological: Negative for adenopathy.  Psychiatric/Behavioral: Negative for confusion.     Allergies  Allergen Reactions  . Cefuroxime Nausea Only  . Codeine Nausea Only  . Doxycycline Other (See Comments)    Lip swelling  . Gabapentin Other (See Comments)    Swelling.   . Iohexol Itching    07-15-13 pt developed itching on fingers after contrast given. Dr. Irish Elders looked at pt and said to put in system as allergy. BB  . Other Other (See Comments)    Contrast Dye- caused fever, chills, awful feeling Bandages - itching, rash  . Oxycodone Other (See Comments)    nausea  . Quinolones Swelling    Lip swelling  . Synvisc [Hylan G-F 20] Swelling     Past Medical History:  Diagnosis Date  . Arthritis   . Asthma   . COPD (chronic obstructive pulmonary disease) ([place])   . Dysphonia   . Dyspnea   . Esophageal cancer ([place])   . GERD (gastroesophageal reflux disease)   . Heart murmur   . History of kidney stones   . Hypertension   . Hypokalemia 10/07/2017  . Melanoma ([place])   . OAB (overactive bladder)   . OSA on CPAP   . 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 Personal history of chemotherapy    esophageal cancer  . Personal history of radiation therapy    esophageal cancer  . Pupil asymmetry    R pupil defect  . Skin cancer   . Sleep apnea   . Spinal cord abscess      Past Surgical History:  Procedure Laterality Date  . ABDOMINAL HYSTERECTOMY    . ANTERIOR CERVICAL  DECOMP/DISCECTOMY FUSION N/A 07/16/2013   Procedure: ANTERIOR CERVICAL DECOMPRESSION/DISCECTOMY FUSION mutlipleLEVELS C4-7;  Surgeon: Erline Levine, MD;  Location: [facility];  Service: Neurosurgery;  Laterality: N/A;  . APPENDECTOMY    . carpel tunn Bilateral   . CATARACT EXTRACTION    . CATARACT EXTRACTION, BILATERAL    . CHOLECYSTECTOMY    . dental implant    . ESOPHAGOGASTRODUODENOSCOPY Left 03/21/2017   Procedure: ESOPHAGOGASTRODUODENOSCOPY (EGD);  Surgeon: Virgel Manifold, MD;  Location: [facility] ENDOSCOPY;  Service: Endoscopy;  Laterality: Left;  . ESOPHAGOGASTRODUODENOSCOPY (EGD) WITH PROPOFOL N/A 08/20/2017   Procedure: ESOPHAGOGASTRODUODENOSCOPY (EGD) WITH PROPOFOL;  Surgeon: Virgel Manifold, MD;  Location: [facility] ENDOSCOPY;  Service: Endoscopy;  Laterality: N/A;  . ESOPHAGOGASTRODUODENOSCOPY (EGD) WITH PROPOFOL N/A 09/19/2017   Procedure: ESOPHAGOGASTRODUODENOSCOPY (EGD) WITH PROPOFOL;  Surgeon: Virgel Manifold, MD;  Location: [facility] ENDOSCOPY;  Service: Endoscopy;  Laterality: N/A;  . ESOPHAGOGASTRODUODENOSCOPY (EGD) WITH PROPOFOL N/A 10/03/2017   Procedure: ESOPHAGOGASTRODUODENOSCOPY (EGD) WITH PROPOFOL;  Surgeon: Virgel Manifold, MD;  Location: [facility] ENDOSCOPY;  Service: Endoscopy;  Laterality: N/A;  . ESOPHAGOGASTRODUODENOSCOPY (EGD) WITH PROPOFOL N/A 10/22/2017   Procedure: ESOPHAGOGASTRODUODENOSCOPY (EGD) WITH PROPOFOL;  Surgeon: Virgel Manifold, MD;  Location: [facility] ENDOSCOPY;  Service: Endoscopy;  Laterality: N/A;  . ESOPHAGOGASTRODUODENOSCOPY (EGD) WITH PROPOFOL N/A 11/18/2017   Procedure: ESOPHAGOGASTRODUODENOSCOPY (EGD) WITH PROPOFOL;  Surgeon: Virgel Manifold, MD;  Location: [facility] ENDOSCOPY;  Service: Endoscopy;  Laterality: N/A;  . ESOPHAGOGASTRODUODENOSCOPY (EGD) WITH PROPOFOL N/A 12/17/2017   Procedure: ESOPHAGOGASTRODUODENOSCOPY (EGD) WITH PROPOFOL;  Surgeon: Virgel Manifold, MD;  Location: [facility] ENDOSCOPY;  Service: Endoscopy;  Laterality: N/A;  .  ESOPHAGOGASTRODUODENOSCOPY (EGD) WITH PROPOFOL N/A 01/08/2018   Procedure: ESOPHAGOGASTRODUODENOSCOPY (EGD) WITH PROPOFOL;  Surgeon: Virgel Manifold, MD;  Location: [facility] ENDOSCOPY;  Service: Endoscopy;  Laterality: N/A;  . ESOPHAGOGASTRODUODENOSCOPY (EGD) WITH PROPOFOL N/A 01/22/2018   Procedure: ESOPHAGOGASTRODUODENOSCOPY (EGD) WITH PROPOFOL;  Surgeon: Virgel Manifold, MD;  Location: [facility] ENDOSCOPY;  Service: Endoscopy;  Laterality: N/A;  . EYE SURGERY    . GASTRIC BYPASS    . HERNIA REPAIR    . HIATAL HERNIA REPAIR    . JOINT REPLACEMENT    . MELANOMA EXCISION    . PORTA CATH INSERTION N/A 04/07/2017   Procedure: PORTA CATH INSERTION;  Surgeon: Algernon Huxley, MD;  Location: [facility] CV LAB;  Service: Cardiovascular;  Laterality: N/A;  . POSTERIOR CERVICAL FUSION/FORAMINOTOMY N/A 07/28/2013   Procedure: Cervical four-seven  posterior cervical fusion;  Surgeon: Erline Levine, MD;  Location: [facility]S;  Service: Neurosurgery;  Laterality: N/A;  . TONSILLECTOMY    . TOTAL KNEE ARTHROPLASTY      Social History   Socioeconomic History  . Marital status: Widowed    Spouse name: Not on file  . Number of children: 2  . Years of education: Not on file  . Highest education level: Master's degree (e.g., MA, MS, MEng, MEd, MSW, MBA)  Occupational History  . Not on file  Social Needs  . Financial resource strain: Not hard at all  . Food insecurity:    Worry: Never true    Inability: Never true  . Transportation needs:    Medical: No    Non-medical: No  Tobacco Use  . Smoking status: Former Smoker    Packs/day: 1.00    Years: 20.00    Pack years: 20.00    Types: Cigarettes    Last attempt to quit: 1977    Years since quitting: 43.1  . Smokeless tobacco: Never Used  Substance and Sexual Activity  . Alcohol use: No  . Drug use: Never  . Sexual activity: Not Currently  Lifestyle  . Physical activity:    Days per week: 2 days    Minutes per session: 30 min  . Stress:  Not at all  Relationships  . Social connections:    Talks on phone: More than three times a week    Gets together: More than three times a week    Attends religious service: More than 4 times per year    Active member of club or organization: Yes    Attends meetings of clubs or organizations: More than 4 times per year    Relationship status: Widowed  . Intimate partner violence:    Fear of current or ex partner: No    Emotionally abused: No    Physically abused: No    Forced sexual activity: No  Other Topics Concern  . Not on file  Social History Narrative   Marital Status- Widowed    Lives by herself    Employement- Retired Pharmacist, hospital   Exercise hx- Does PT     Family History  Problem Relation Age of Onset  . Breast cancer Mother 68  . Arthritis-Osteo Mother   . Breast cancer Sister 61  . Bladder Cancer Sister   . Bladder Cancer Father   .  Tongue cancer Father   . Congenital heart disease Father   . Prostate cancer Brother   . Uterine cancer Maternal Aunt   . Lung cancer Paternal Uncle   . Leukemia Maternal Grandmother   . Liver cancer Paternal Grandmother   . Colon cancer Neg Hx      Current Outpatient Medications:  .  albuterol (PROVENTIL HFA;VENTOLIN HFA) 108 (90 Base) MCG/ACT inhaler, Inhale 2 puffs into the lungs every 4 (four) hours as needed for wheezing or shortness of breath., Disp: , Rfl:  .  calcium carbonate (TUMS EX) 750 MG chewable tablet, Chew 1 tablet by mouth as needed for heartburn. , Disp: , Rfl:  .  chlorhexidine (PERIDEX) 0.12 % solution, Use as directed 15 mLs in the mouth or throat 2 (two) times daily., Disp: , Rfl:  .  Cholecalciferol (VITAMIN D) 2000 UNITS CAPS, Take 2,000 Units by mouth daily., Disp: , Rfl:  .  cyanocobalamin 500 MCG tablet, Take 500 mcg every other day by mouth., Disp: , Rfl:  .  Diclofenac Sodium 1 % CREA, Apply to left arm as needed for pain three times daily, Disp: 120 g, Rfl: 0 .  dronabinol (MARINOL) 5 MG capsule, Take 1  capsule (5 mg total) by mouth 2 (two) times daily before a meal., Disp: 60 capsule, Rfl: 0 .  fluticasone (FLONASE) 50 MCG/ACT nasal spray, Place 1-2 sprays into both nostrils daily., Disp: 16 g, Rfl: 5 .  furosemide (LASIX) 20 MG tablet, TAKE 1-2 TABLETS BY MOUTH DAILY AS NEEDED FOR EDEMA, Disp: 180 tablet, Rfl: 1 .  hydrocortisone cream 1 %, Apply 1 application topically daily as needed for itching. , Disp: , Rfl:  .  loratadine (CLARITIN) 10 MG tablet, Take 10 mg by mouth daily., Disp: , Rfl:  .  metoprolol succinate (TOPROL-XL) 50 MG 24 hr tablet, TAKE 1 TABLET BY MOUTH DAILY, Disp: 90 tablet, Rfl: 1 .  Multiple Vitamins-Minerals (MULTIVITAMIN ADULT) CHEW, Chew 1 tablet by mouth daily. , Disp: , Rfl:  .  MYRBETRIQ 25 MG TB24 tablet, TAKE ONE TABLET EVERY DAY, Disp: 30 tablet, Rfl: 2 .  ondansetron (ZOFRAN) 8 MG tablet, Take 1 tablet (8 mg total) by mouth 2 (two) times daily as needed (Nausea or vomiting)., Disp: 30 tablet, Rfl: 3 .  pantoprazole (PROTONIX) 40 MG tablet, Take 1 tablet (40 mg total) by mouth 2 (two) times daily before a meal., Disp: , Rfl:  .  Polyethyl Glycol-Propyl Glycol (SYSTANE OP), Apply 1 drop to eye daily as needed (dry eyes)., Disp: , Rfl:  .  potassium chloride (KLOR-CON) 20 MEQ packet, Take 20 mEq by mouth daily., Disp: 30 packet, Rfl: 1 .  pregabalin (LYRICA) 150 MG capsule, Take 1 capsule (150 mg total) by mouth 2 (two) times daily., Disp: , Rfl:  .  prochlorperazine (COMPAZINE) 10 MG tablet, Take 1 tablet (10 mg total) by mouth every 6 (six) hours as needed (Nausea or vomiting)., Disp: 30 tablet, Rfl: 1 .  pyridOXINE (VITAMIN B-6) 100 MG tablet, Take 1 tablet (100 mg total) by mouth daily., Disp: 30 tablet, Rfl: 3 .  senna-docusate (SENOKOT-S) 8.6-50 MG tablet, Take 2 tablets by mouth daily., Disp: 60 tablet, Rfl: 0 .  sucralfate (CARAFATE) 1 GM/10ML suspension, Take 10 mLs (1 g total) by mouth 4 (four) times daily., Disp: , Rfl:  .  traMADol (ULTRAM) 50 MG tablet,  Take 1 tablet (50 mg total) by mouth every 6 (six) hours as needed., Disp: 60 tablet, Rfl: 0 .  traZODone (DESYREL) 50 MG tablet, Take 0.5-1 tablets (25-50 mg total) by mouth at bedtime as needed for sleep., Disp: , Rfl:  No current facility-administered medications for this visit.   Facility-Administered Medications Ordered in Other Visits:  .  sodium chloride flush (NS) 0.9 % injection 10 mL, 10 mL, Intravenous, PRN, Earlie Server, MD, 10 mL at 01/26/18 0904  Physical exam:  Vitals:   07/08/18 1008  BP: 126/60  Pulse: 70  Resp: 18  Temp: 97.9 F (36.6 C)  TempSrc: Tympanic  Weight: 202 lb 14.4 oz (92 kg)  ECOG 2 Physical Exam  Constitutional: She is oriented to person, place, and time. No distress.  HENT:  Head: Normocephalic and atraumatic.  Nose: Nose normal.  Mouth/Throat: Oropharynx is clear and moist. No oropharyngeal exudate.  Eyes: Pupils are equal, round, and reactive to light. Conjunctivae and EOM are normal. No scleral icterus.  Neck: Normal range of motion. Neck supple. No JVD present.  Cardiovascular: Normal rate and regular rhythm.  Murmur heard. Pulmonary/Chest: Effort normal and breath sounds normal. No respiratory distress. She has no wheezes. She has no rales. She exhibits no tenderness.  Abdominal: Soft. Bowel sounds are normal. She exhibits no distension. There is no abdominal tenderness.  Musculoskeletal: Normal range of motion.        General: Edema present. No tenderness or deformity.  Lymphadenopathy:    She has no cervical adenopathy.  Neurological: She is alert and oriented to person, place, and time. No cranial nerve deficit. She exhibits normal muscle tone. Coordination normal.  Skin: Skin is warm and dry. She is not diaphoretic. No erythema.  Psychiatric: Affect and judgment normal.    CMP Latest Ref Rng & Units 07/08/2018  Glucose 70 - 99 mg/dL 98  BUN 8 - 23 mg/dL 16  Creatinine 0.44 - 1.00 mg/dL 0.97  Sodium 135 - 145 mmol/L 139  Potassium 3.5 -  5.1 mmol/L 4.1  Chloride 98 - 111 mmol/L 101  CO2 22 - 32 mmol/L 30  Calcium 8.9 - 10.3 mg/dL 8.4(L)  Total Protein 6.5 - 8.1 g/dL 6.9  Total Bilirubin 0.3 - 1.2 mg/dL 0.6  Alkaline Phos 38 - 126 U/L 96  AST 15 - 41 U/L 33  ALT 0 - 44 U/L 15   CBC Latest Ref Rng & Units 07/08/2018  WBC 4.0 - 10.5 K/uL 7.0  Hemoglobin 12.0 - 15.0 g/dL 11.7(L)  Hematocrit 36.0 - 46.0 % 37.3  Platelets 150 - 400 K/uL 266   RADIOGRAPHIC STUDIES: I have personally reviewed the radiological images as listed and agreed with the findings in the report.  PET scan 01/23/2018  Interval development of loculated ascites within the upper abdomen which overlies the liver and spleen and exhibits increased radiotracer uptake. Findings worrisome for peritoneal carcinomatosis. Consider further evaluation with diagnostic paracentesis. 2. Soft tissue infiltration within the omentum with increased radiotracer uptake noted. Also worrisome for peritoneal carcinomatosis. 3. New hypermetabolic internal mammary lymph nodes and right CP angle lymph node. Suspicious for metastatic adenopathy. 4. Mild FDG uptake is identified localizing to the lower third of the esophagus within SUV max of 5.15.  Bone Scan 05/08/2018  1. Several small foci of activity in anterior ribs as noted above, and metastatic involvement can not be excluded. 2. Somewhat mottled activity within the midthoracic and upper lumbar spine and metastatic involvement can not be excluded. Correlate with plain films or MRI preferably.  Assessment and plan  Cancer Staging Malignant neoplasm of lower third of esophagus ([place])  Staging form: Esophagus - Adenocarcinoma, AJCC 8th Edition - Clinical stage from 04/10/2017: Stage IVB (cTX, cNX, pM1) - Signed by Earlie Server, MD on 04/10/2017  1. Esophageal adenocarcinoma ([place])   2. Dysuria   3. Bilateral lower extremity edema   4. Bone metastasis ([place])   5. Encounter for antineoplastic immunotherapy   6. Depression,  unspecified depression type   7. Acute cystitis without hematuria    #Esophageal cancer, metastatic Currently on third line treatment with immunotherapy Keytruda. TPS 20% Tolerating well.  Labs are reviewed and discussed. Proceed with cycle 3 Keytruda.   Baseline TSH elevated. Today's TSH normalized. .  # Dysuria, will obtain clean catch UA.  Labs reviewed, positive for large leukocyte, negative nitrate. Will add urine culture.  Treatment empirically with Keflex 552m daily for 5 days.  # Nausea, improved with Zofran. Wants refill. Rx sent to pharmacy. # Depression/insominia, continue Trazodone 568mQHS.  # Bone metastasis, discussed about obtaining dental clearance for Zometa.  # Constipation, continue Senokot 2 tabs daily.  # Leg swelling bilateral. Will check [place]ous r/o DVT. Advise low salt diet continue lasix   Follow-up in 3 weeks.   ZhEarlie ServerMD, PhD Hematology Oncology [facility]- [phone]/26/20

## 2018-07-08 NOTE — Progress Notes (Signed)
Patient here for follow up. States she has been taking PRN antiemetic for nausea. Pt with urinary frequency, burning, and peeing small amounts of urine. Pt states that she has been sundowning lately.

## 2018-07-09 ENCOUNTER — Other Ambulatory Visit: Payer: Self-pay

## 2018-07-09 DIAGNOSIS — R300 Dysuria: Secondary | ICD-10-CM

## 2018-07-09 LAB — CEA: CEA: 2 ng/mL (ref 0.0–4.7)

## 2018-07-10 ENCOUNTER — Ambulatory Visit
Admission: RE | Admit: 2018-07-10 | Discharge: 2018-07-10 | Disposition: A | Discharge status: Home / Self Care | Payer: Medicare Other | Source: Ambulatory Visit | Attending: Oncology | Admitting: Oncology

## 2018-07-10 DIAGNOSIS — R600 Localized edema: Secondary | ICD-10-CM | POA: Diagnosis present

## 2018-07-11 LAB — URINE CULTURE: Culture: >=100000 — AB

## 2018-07-15 ENCOUNTER — Ambulatory Visit: Payer: Medicare Other | Admitting: Anesthesiology

## 2018-07-15 ENCOUNTER — Ambulatory Visit
Admission: RE | Admit: 2018-07-15 | Discharge: 2018-07-15 | Disposition: A | Discharge status: Home / Self Care | Payer: Medicare Other | Attending: Gastroenterology | Admitting: Gastroenterology

## 2018-07-15 ENCOUNTER — Encounter
Admission: RE | Disposition: A | Discharge status: Home / Self Care | Payer: Self-pay | Source: Home / Self Care | Attending: Gastroenterology

## 2018-07-15 ENCOUNTER — Other Ambulatory Visit: Payer: Self-pay

## 2018-07-15 DIAGNOSIS — Z981 Arthrodesis status: Secondary | ICD-10-CM | POA: Diagnosis not present

## 2018-07-15 DIAGNOSIS — Z8052 Family history of malignant neoplasm of bladder: Secondary | ICD-10-CM | POA: Insufficient documentation

## 2018-07-15 DIAGNOSIS — Z803 Family history of malignant neoplasm of breast: Secondary | ICD-10-CM | POA: Insufficient documentation

## 2018-07-15 DIAGNOSIS — Z79899 Other long term (current) drug therapy: Secondary | ICD-10-CM | POA: Diagnosis not present

## 2018-07-15 DIAGNOSIS — Z90710 Acquired absence of both cervix and uterus: Secondary | ICD-10-CM | POA: Insufficient documentation

## 2018-07-15 DIAGNOSIS — K219 Gastro-esophageal reflux disease without esophagitis: Secondary | ICD-10-CM | POA: Diagnosis not present

## 2018-07-15 DIAGNOSIS — Z885 Allergy status to narcotic agent status: Secondary | ICD-10-CM | POA: Insufficient documentation

## 2018-07-15 DIAGNOSIS — G4733 Obstructive sleep apnea (adult) (pediatric): Secondary | ICD-10-CM | POA: Diagnosis not present

## 2018-07-15 DIAGNOSIS — Z9884 Bariatric surgery status: Secondary | ICD-10-CM | POA: Insufficient documentation

## 2018-07-15 DIAGNOSIS — I10 Essential (primary) hypertension: Secondary | ICD-10-CM | POA: Diagnosis not present

## 2018-07-15 DIAGNOSIS — Z9841 Cataract extraction status, right eye: Secondary | ICD-10-CM | POA: Insufficient documentation

## 2018-07-15 DIAGNOSIS — Z9221 Personal history of antineoplastic chemotherapy: Secondary | ICD-10-CM | POA: Insufficient documentation

## 2018-07-15 DIAGNOSIS — Z87891 Personal history of nicotine dependence: Secondary | ICD-10-CM | POA: Insufficient documentation

## 2018-07-15 DIAGNOSIS — Z7951 Long term (current) use of inhaled steroids: Secondary | ICD-10-CM | POA: Insufficient documentation

## 2018-07-15 DIAGNOSIS — Z9842 Cataract extraction status, left eye: Secondary | ICD-10-CM | POA: Insufficient documentation

## 2018-07-15 DIAGNOSIS — Z806 Family history of leukemia: Secondary | ICD-10-CM | POA: Insufficient documentation

## 2018-07-15 DIAGNOSIS — M1990 Unspecified osteoarthritis, unspecified site: Secondary | ICD-10-CM | POA: Insufficient documentation

## 2018-07-15 DIAGNOSIS — E876 Hypokalemia: Secondary | ICD-10-CM | POA: Insufficient documentation

## 2018-07-15 DIAGNOSIS — C159 Malignant neoplasm of esophagus, unspecified: Secondary | ICD-10-CM | POA: Diagnosis not present

## 2018-07-15 DIAGNOSIS — N3281 Overactive bladder: Secondary | ICD-10-CM | POA: Insufficient documentation

## 2018-07-15 DIAGNOSIS — Z85820 Personal history of malignant melanoma of skin: Secondary | ICD-10-CM | POA: Diagnosis not present

## 2018-07-15 DIAGNOSIS — R1310 Dysphagia, unspecified: Secondary | ICD-10-CM | POA: Diagnosis not present

## 2018-07-15 DIAGNOSIS — K222 Esophageal obstruction: Secondary | ICD-10-CM | POA: Diagnosis present

## 2018-07-15 DIAGNOSIS — Z87442 Personal history of urinary calculi: Secondary | ICD-10-CM | POA: Diagnosis not present

## 2018-07-15 DIAGNOSIS — Z8249 Family history of ischemic heart disease and other diseases of the circulatory system: Secondary | ICD-10-CM | POA: Insufficient documentation

## 2018-07-15 DIAGNOSIS — Z9049 Acquired absence of other specified parts of digestive tract: Secondary | ICD-10-CM | POA: Diagnosis not present

## 2018-07-15 DIAGNOSIS — R011 Cardiac murmur, unspecified: Secondary | ICD-10-CM | POA: Diagnosis not present

## 2018-07-15 DIAGNOSIS — Z96653 Presence of artificial knee joint, bilateral: Secondary | ICD-10-CM | POA: Insufficient documentation

## 2018-07-15 DIAGNOSIS — Z8049 Family history of malignant neoplasm of other genital organs: Secondary | ICD-10-CM | POA: Insufficient documentation

## 2018-07-15 DIAGNOSIS — Z800 Family history of malignant neoplasm of digestive organs: Secondary | ICD-10-CM | POA: Insufficient documentation

## 2018-07-15 DIAGNOSIS — Z8042 Family history of malignant neoplasm of prostate: Secondary | ICD-10-CM | POA: Insufficient documentation

## 2018-07-15 DIAGNOSIS — J449 Chronic obstructive pulmonary disease, unspecified: Secondary | ICD-10-CM | POA: Insufficient documentation

## 2018-07-15 DIAGNOSIS — Z801 Family history of malignant neoplasm of trachea, bronchus and lung: Secondary | ICD-10-CM | POA: Insufficient documentation

## 2018-07-15 DIAGNOSIS — Z8261 Family history of arthritis: Secondary | ICD-10-CM | POA: Insufficient documentation

## 2018-07-15 DIAGNOSIS — Z888 Allergy status to other drugs, medicaments and biological substances status: Secondary | ICD-10-CM | POA: Insufficient documentation

## 2018-07-15 HISTORY — PX: ESOPHAGOGASTRODUODENOSCOPY (EGD) WITH PROPOFOL: SHX5813

## 2018-07-15 SURGERY — ESOPHAGOGASTRODUODENOSCOPY (EGD) WITH PROPOFOL
Anesthesia: General

## 2018-07-15 MED ORDER — PROPOFOL 500 MG/50ML IV EMUL
INTRAVENOUS | Status: DC | PRN
  Administered 2018-07-15: 130 ug/kg/min via INTRAVENOUS

## 2018-07-15 MED ORDER — LIDOCAINE HCL (PF) 2 % IJ SOLN
INTRAMUSCULAR | Status: AC
  Filled 2018-07-15: qty 10

## 2018-07-15 MED ORDER — SODIUM CHLORIDE 0.9 % IV SOLN
INTRAVENOUS | Status: DC
  Administered 2018-07-15: 10:00:00 via INTRAVENOUS

## 2018-07-15 MED ORDER — LIDOCAINE HCL (CARDIAC) PF 100 MG/5ML IV SOSY
PREFILLED_SYRINGE | INTRAVENOUS | Status: DC | PRN
  Administered 2018-07-15: 100 mg via INTRAVENOUS

## 2018-07-15 MED ORDER — PROPOFOL 10 MG/ML IV BOLUS
INTRAVENOUS | Status: DC | PRN
  Administered 2018-07-15: 40 mg via INTRAVENOUS

## 2018-07-15 NOTE — Anesthesia Procedure Notes (Signed)
Performed by: Jaryn Rosko, CRNA Pre-anesthesia Checklist: Patient identified, Emergency Drugs available, Suction available, Patient being monitored and Timeout performed Patient Re-evaluated:Patient Re-evaluated prior to induction Oxygen Delivery Method: Nasal cannula Induction Type: IV induction       

## 2018-07-15 NOTE — Anesthesia Post-op Follow-up Note (Signed)
Anesthesia QCDR form completed.        

## 2018-07-15 NOTE — Transfer of Care (Signed)
Immediate Anesthesia Transfer of Care Note  Patient: [name]  Procedure(s) Performed: ESOPHAGOGASTRODUODENOSCOPY (EGD) WITH PROPOFOL (N/A )  Patient Location: PACU  Anesthesia Type:General  Level of Consciousness: drowsy  Airway & Oxygen Therapy: Patient Spontanous Breathing and Patient connected to nasal cannula oxygen  Post-op Assessment: Report given to RN and Post -op Vital signs reviewed and stable  Post vital signs: Reviewed and stable  Last Vitals:  Vitals Value Taken Time  BP    Temp    Pulse 64 07/15/2018 10:19 AM  Resp 14 07/15/2018 10:19 AM  SpO2 95 % 07/15/2018 10:19 AM  Vitals shown include unvalidated device data.  Last Pain:  Vitals:   07/15/18 0933  TempSrc: Oral         Complications: No apparent anesthesia complications

## 2018-07-15 NOTE — Op Note (Signed)
[name] Procedure Date: 07/15/2018 9:57 AM MRN: [id number] Account #: [id number] Date of Birth: June 05, 1934 Admit Type: Outpatient Age: 84 Room: [name] Note Status: Finalized Procedure:            Upper GI endoscopy Indications:          Dysphagia, Stricture of the esophagus, For therapy of                        esophageal stricture Providers:            Jon Kasparek B. Bonna Gains MD, MD Referring MD:         Elveria Rising. Damita Dunnings, MD (Referring MD) Medicines:            Monitored Anesthesia Care Complications:        No immediate complications. Procedure:            Pre-Anesthesia Assessment:                       - The risks and benefits of the procedure and the                        sedation options and risks were discussed with the                        patient. All questions were answered and informed                        consent was obtained.                       - Patient identification and proposed procedure were                        verified prior to the procedure.                       - ASA Grade Assessment: III - A patient with severe                        systemic disease.                       After obtaining informed consent, the endoscope was                        passed under direct vision. Throughout the procedure,                        the patient's blood pressure, pulse, and oxygen                        saturations were monitored continuously. The Endoscope                        was introduced through the mouth, and advanced to the                        jejunum. The upper GI endoscopy was accomplished with  ease. The patient tolerated the procedure well. Findings:      One mild stenosis was found at the gastroesophageal junction. The       stenosis was traversed. A TTS dilator was passed through the scope.       Dilation with an 18-19-20 mm  balloon dilator was performed to 19 mm. The       stricture appears significantly improved since her previous procedures.       Scope was able to be past the site without need for dilation. Dilation       was initially done to 60mm and held for 1 min with no heme effect.       Dilation was then done to 15mm with good heme effect.      There is no endoscopic evidence of mass in the entire esophagus.      The entire examined stomach was normal.      The anastomosis was normal.      The examined jejunum was normal. Impression:           - Esophageal stenosis. Dilated.                       - Normal stomach.                       - Normal anastomosis.                       - Normal examined jejunum.                       - No specimens collected. Recommendation:       - Continue PPI BID                       Stop Sucralfate                       Follow up in GI clinic in 4-6 weeks or as scheduled                       - Continue present medications.                       - Soft diet today.                       - The findings and recommendations were discussed with                        the patient.                       - The findings and recommendations were discussed with                        the patient's family. Procedure Code(s):    --- Professional ---                       [id number], Esophagogastroduodenoscopy, flexible, transoral;                        with transendoscopic balloon dilation of esophagus                        (  less than 30 mm diameter) Diagnosis Code(s):    --- Professional ---                       K22.2, Esophageal obstruction                       R13.10, Dysphagia, unspecified CPT copyright 2018 American Medical Association. All rights reserved. The codes documented in this report are preliminary and upon coder review may  be revised to meet current compliance requirements.  [name], MD Margretta Sidle B. Bonna Gains MD, MD 07/15/2018 10:20:55 AM This report  has been signed electronically. Number of Addenda: 0 Note Initiated On: 07/15/2018 9:57 AM Estimated Blood Loss: Estimated blood loss: none.      [facility]

## 2018-07-15 NOTE — Anesthesia Preprocedure Evaluation (Signed)
Anesthesia Evaluation  Patient identified by MRN, date of birth, ID band Patient awake    Reviewed: Allergy & Precautions, H&P , NPO status , Patient's Chart, lab work & pertinent test results, reviewed documented beta blocker date and time   Airway Mallampati: II   Neck ROM: full    Dental  (+) Poor Dentition   Pulmonary shortness of breath and with exertion, asthma , sleep apnea and Continuous Positive Airway Pressure Ventilation , COPD, former smoker,    Pulmonary exam normal        Cardiovascular Exercise Tolerance: Poor hypertension, On Medications + CAD  Normal cardiovascular exam+ Valvular Problems/Murmurs  Rhythm:regular Rate:Normal     Neuro/Psych  Neuromuscular disease negative psych ROS   GI/Hepatic Neg liver ROS, PUD, GERD  ,  Endo/Other  negative endocrine ROS  Renal/GU negative Renal ROS  negative genitourinary   Musculoskeletal   Abdominal   Peds  Hematology  (+) Blood dyscrasia, anemia ,   Anesthesia Other Findings Past Medical History: No date: Arthritis No date: Asthma No date: COPD (chronic obstructive pulmonary disease) (HCC) No date: Dysphonia No date: Dyspnea No date: Esophageal cancer (HCC) No date: GERD (gastroesophageal reflux disease) No date: Heart murmur No date: History of kidney stones No date: Hypertension 10/07/2017: Hypokalemia No date: Melanoma ([place]) No date: OAB (overactive bladder) No date: OSA on CPAP No date: Paresthesia     Comment:  from neck down after spinal abscess No date: Personal history of chemotherapy     Comment:  esophageal cancer No date: Personal history of radiation therapy     Comment:  esophageal cancer No date: Pupil asymmetry     Comment:  R pupil defect No date: Skin cancer No date: Sleep apnea 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 Past Surgical History: No date: ABDOMINAL HYSTERECTOMY 07/16/2013: ANTERIOR CERVICAL DECOMP/DISCECTOMY FUSION; N/A  Comment:  Procedure: ANTERIOR CERVICAL DECOMPRESSION/DISCECTOMY               FUSION mutlipleLEVELS C4-7;  Surgeon: Erline Levine, MD;                Location: [facility];  Service: Neurosurgery;                Laterality: N/A; No date: APPENDECTOMY No date: carpel tunn; Bilateral No date: CATARACT EXTRACTION No date: CATARACT EXTRACTION, BILATERAL No date: CHOLECYSTECTOMY No date: dental implant 03/21/2017: ESOPHAGOGASTRODUODENOSCOPY; Left     Comment:  Procedure: ESOPHAGOGASTRODUODENOSCOPY (EGD);  Surgeon:               Virgel Manifold, MD;  Location: [facility] ENDOSCOPY;                Service: Endoscopy;  Laterality: Left; 08/20/2017: ESOPHAGOGASTRODUODENOSCOPY (EGD) WITH PROPOFOL; N/A     Comment:  Procedure: ESOPHAGOGASTRODUODENOSCOPY (EGD) WITH               PROPOFOL;  Surgeon: Virgel Manifold, MD;  Location:               [facility] ENDOSCOPY;  Service: Endoscopy;  Laterality: N/A; 09/19/2017: ESOPHAGOGASTRODUODENOSCOPY (EGD) WITH PROPOFOL; N/A     Comment:  Procedure: ESOPHAGOGASTRODUODENOSCOPY (EGD) WITH               PROPOFOL;  Surgeon: Virgel Manifold, MD;  Location:               [facility] ENDOSCOPY;  Service: Endoscopy;  Laterality: N/A; 10/03/2017: ESOPHAGOGASTRODUODENOSCOPY (EGD) WITH PROPOFOL; N/A     Comment:  Procedure: ESOPHAGOGASTRODUODENOSCOPY (  EGD) WITH               PROPOFOL;  Surgeon: Virgel Manifold, MD;  Location:               [facility] ENDOSCOPY;  Service: Endoscopy;  Laterality: N/A; 10/22/2017: ESOPHAGOGASTRODUODENOSCOPY (EGD) WITH PROPOFOL; N/A     Comment:  Procedure: ESOPHAGOGASTRODUODENOSCOPY (EGD) WITH               PROPOFOL;  Surgeon: Virgel Manifold, MD;  Location:               [facility] ENDOSCOPY;  Service: Endoscopy;  Laterality: N/A; 11/18/2017: ESOPHAGOGASTRODUODENOSCOPY (EGD) WITH PROPOFOL; N/A     Comment:  Procedure: ESOPHAGOGASTRODUODENOSCOPY (EGD) WITH               PROPOFOL;  Surgeon: Virgel Manifold, MD;  Location:               [facility]  ENDOSCOPY;  Service: Endoscopy;  Laterality: N/A; 12/17/2017: ESOPHAGOGASTRODUODENOSCOPY (EGD) WITH PROPOFOL; N/A     Comment:  Procedure: ESOPHAGOGASTRODUODENOSCOPY (EGD) WITH               PROPOFOL;  Surgeon: Virgel Manifold, MD;  Location:               [facility] ENDOSCOPY;  Service: Endoscopy;  Laterality: N/A; 01/08/2018: ESOPHAGOGASTRODUODENOSCOPY (EGD) WITH PROPOFOL; N/A     Comment:  Procedure: ESOPHAGOGASTRODUODENOSCOPY (EGD) WITH               PROPOFOL;  Surgeon: Virgel Manifold, MD;  Location:               [facility] ENDOSCOPY;  Service: Endoscopy;  Laterality: N/A; 01/22/2018: ESOPHAGOGASTRODUODENOSCOPY (EGD) WITH PROPOFOL; N/A     Comment:  Procedure: ESOPHAGOGASTRODUODENOSCOPY (EGD) WITH               PROPOFOL;  Surgeon: Virgel Manifold, MD;  Location:               [facility] ENDOSCOPY;  Service: Endoscopy;  Laterality: N/A; No date: EYE SURGERY No date: GASTRIC BYPASS No date: HERNIA REPAIR No date: HIATAL HERNIA REPAIR No date: JOINT REPLACEMENT No date: MELANOMA EXCISION 04/07/2017: PORTA CATH INSERTION; N/A     Comment:  Procedure: PORTA CATH INSERTION;  Surgeon: Algernon Huxley,              MD;  Location: [place] CV LAB;  Service:               Cardiovascular;  Laterality: N/A; 07/28/2013: POSTERIOR CERVICAL FUSION/FORAMINOTOMY; N/A     Comment:  Procedure: Cervical four-seven  posterior cervical               fusion;  Surgeon: Erline Levine, MD;  Location: [place] NEURO               ORS;  Service: Neurosurgery;  Laterality: N/A; No date: TONSILLECTOMY No date: TOTAL KNEE ARTHROPLASTY   Reproductive/Obstetrics negative OB ROS                             Anesthesia Physical Anesthesia Plan  ASA: IV  Anesthesia Plan: General   Post-op Pain Management:    Induction:   PONV Risk Score and Plan:   Airway Management Planned:   Additional Equipment:   Intra-op Plan:   Post-operative Plan:   Informed Consent: I have reviewed the  patients History and Physical, chart,  labs and discussed the procedure including the risks, benefits and alternatives for the proposed anesthesia with the patient or authorized representative who has indicated his/her understanding and acceptance.     Dental Advisory Given  Plan Discussed with: CRNA  Anesthesia Plan Comments:         Anesthesia Quick Evaluation

## 2018-07-15 NOTE — H&P (Signed)
[name], MD [address], [place], [place], [place], [address] [address], [place], [place], [place], [address] Phone: [phone]  Fax: [phone]  Primary Care Physician:  Tonia Ghent, MD   Pre-Procedure History & Physical: HPI:  [name] is a 84 y.o. female is here for an EGD.   Past Medical History:  Diagnosis Date  . Arthritis   . Asthma   . COPD (chronic obstructive pulmonary disease) ([place])   . Dysphonia   . Dyspnea   . Esophageal cancer ([place])   . GERD (gastroesophageal reflux disease)   . Heart murmur   . History of kidney stones   . Hypertension   . Hypokalemia 10/07/2017  . Melanoma ([place])   . OAB (overactive bladder)   . OSA on CPAP   . 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 Personal history of chemotherapy    esophageal cancer  . Personal history of radiation therapy    esophageal cancer  . Pupil asymmetry    R pupil defect  . Skin cancer   . Sleep apnea   . Spinal cord abscess     Past Surgical History:  Procedure Laterality Date  . ABDOMINAL HYSTERECTOMY    . ANTERIOR CERVICAL DECOMP/DISCECTOMY FUSION N/A 07/16/2013   Procedure: ANTERIOR CERVICAL DECOMPRESSION/DISCECTOMY FUSION mutlipleLEVELS C4-7;  Surgeon: Erline Levine, MD;  Location: [facility];  Service: Neurosurgery;  Laterality: N/A;  . APPENDECTOMY    . carpel tunn Bilateral   . CATARACT EXTRACTION    . CATARACT EXTRACTION, BILATERAL    . CHOLECYSTECTOMY    . dental implant    . ESOPHAGOGASTRODUODENOSCOPY Left 03/21/2017   Procedure: ESOPHAGOGASTRODUODENOSCOPY (EGD);  Surgeon: Virgel Manifold, MD;  Location: [facility] ENDOSCOPY;  Service: Endoscopy;  Laterality: Left;  . ESOPHAGOGASTRODUODENOSCOPY (EGD) WITH PROPOFOL N/A 08/20/2017   Procedure: ESOPHAGOGASTRODUODENOSCOPY (EGD) WITH PROPOFOL;  Surgeon: Virgel Manifold, MD;  Location: [facility] ENDOSCOPY;  Service: Endoscopy;  Laterality: N/A;  . ESOPHAGOGASTRODUODENOSCOPY (EGD) WITH PROPOFOL N/A  09/19/2017   Procedure: ESOPHAGOGASTRODUODENOSCOPY (EGD) WITH PROPOFOL;  Surgeon: Virgel Manifold, MD;  Location: [facility] ENDOSCOPY;  Service: Endoscopy;  Laterality: N/A;  . ESOPHAGOGASTRODUODENOSCOPY (EGD) WITH PROPOFOL N/A 10/03/2017   Procedure: ESOPHAGOGASTRODUODENOSCOPY (EGD) WITH PROPOFOL;  Surgeon: Virgel Manifold, MD;  Location: [facility] ENDOSCOPY;  Service: Endoscopy;  Laterality: N/A;  . ESOPHAGOGASTRODUODENOSCOPY (EGD) WITH PROPOFOL N/A 10/22/2017   Procedure: ESOPHAGOGASTRODUODENOSCOPY (EGD) WITH PROPOFOL;  Surgeon: Virgel Manifold, MD;  Location: [facility] ENDOSCOPY;  Service: Endoscopy;  Laterality: N/A;  . ESOPHAGOGASTRODUODENOSCOPY (EGD) WITH PROPOFOL N/A 11/18/2017   Procedure: ESOPHAGOGASTRODUODENOSCOPY (EGD) WITH PROPOFOL;  Surgeon: Virgel Manifold, MD;  Location: [facility] ENDOSCOPY;  Service: Endoscopy;  Laterality: N/A;  . ESOPHAGOGASTRODUODENOSCOPY (EGD) WITH PROPOFOL N/A 12/17/2017   Procedure: ESOPHAGOGASTRODUODENOSCOPY (EGD) WITH PROPOFOL;  Surgeon: Virgel Manifold, MD;  Location: [facility] ENDOSCOPY;  Service: Endoscopy;  Laterality: N/A;  . ESOPHAGOGASTRODUODENOSCOPY (EGD) WITH PROPOFOL N/A 01/08/2018   Procedure: ESOPHAGOGASTRODUODENOSCOPY (EGD) WITH PROPOFOL;  Surgeon: Virgel Manifold, MD;  Location: [facility] ENDOSCOPY;  Service: Endoscopy;  Laterality: N/A;  . ESOPHAGOGASTRODUODENOSCOPY (EGD) WITH PROPOFOL N/A 01/22/2018   Procedure: ESOPHAGOGASTRODUODENOSCOPY (EGD) WITH PROPOFOL;  Surgeon: Virgel Manifold, MD;  Location: [facility] ENDOSCOPY;  Service: Endoscopy;  Laterality: N/A;  . EYE SURGERY    . GASTRIC BYPASS    . HERNIA REPAIR    . HIATAL HERNIA REPAIR    . JOINT REPLACEMENT Bilateral    knee  . MELANOMA EXCISION    .  PORTA CATH INSERTION N/A 04/07/2017   Procedure: PORTA CATH INSERTION;  Surgeon: Algernon Huxley, MD;  Location: [facility] CV LAB;  Service: Cardiovascular;  Laterality: N/A;  . POSTERIOR CERVICAL FUSION/FORAMINOTOMY N/A 07/28/2013   Procedure:  Cervical four-seven  posterior cervical fusion;  Surgeon: Erline Levine, MD;  Location: [facility] NEUR[facility] ORS;  Service: Neurosurgery;  Laterality: N/A;  . TONSILLECTOMY    . TOTAL KNEE ARTHROPLASTY      Prior to Admission medications   Medication Sig Start Date End Date Taking? Authorizing Provider  albuterol (PROVENTIL HFA;VENTOLIN HFA) 108 (90 Base) MCG/ACT inhaler Inhale 2 puffs into the lungs every 4 (four) hours as needed for wheezing or shortness of breath.   Yes [provider]  calcium carbonate (TUMS EX) 750 MG chewable tablet Chew 1 tablet by mouth as needed for heartburn.    Yes [provider]  cephALEXin (KEFLEX) 500 MG capsule Take 1 capsule (500 mg total) by mouth 4 (four) times daily. 07/08/18  Yes Earlie Server, MD  Cholecalciferol (VITAMIN D) 2000 UNITS CAPS Take 2,000 Units by mouth daily.   Yes [provider]  cyanocobalamin 500 MCG tablet Take 500 mcg every other day by mouth.   Yes [provider]  dronabinol (MARINOL) 5 MG capsule Take 1 capsule (5 mg total) by mouth 2 (two) times daily before a meal. 06/17/18  Yes Earlie Server, MD  fluticasone [facility]) 50 MCG/ACT nasal spray Place 1-2 sprays into both nostrils daily. 06/13/17  Yes Tonia Ghent, MD  furosemide (LASIX) 20 MG tablet TAKE 1-2 TABLETS BY MOUTH DAILY AS NEEDED FOR EDEMA 05/12/18  Yes Tonia Ghent, MD  hydrocortisone cream 1 % Apply 1 application topically daily as needed for itching.    Yes [provider]  loratadine (CLARITIN) 10 MG tablet Take 10 mg by mouth daily.   Yes [provider]  metoprolol succinate (TOPROL-XL) 50 MG 24 hr tablet TAKE 1 TABLET BY MOUTH DAILY 10/28/17  Yes Tonia Ghent, MD  Multiple Vitamins-Minerals (MULTIVITAMIN ADULT) CHEW Chew 1 tablet by mouth daily.    Yes [provider]  MYRBETRIQ 25 MG TB24 tablet TAKE ONE TABLET EVERY DAY 07/03/18  Yes Tonia Ghent, MD  ondansetron (ZOFRAN) 8 MG tablet Take 1 tablet (8 mg total) by  mouth 2 (two) times daily as needed (Nausea or vomiting). 07/08/18  Yes Earlie Server, MD  pantoprazole (PROTONIX) 40 MG tablet Take 1 tablet (40 mg total) by mouth 2 (two) times daily before a meal. 04/24/18  Yes Tonia Ghent, MD  Polyethyl Glycol-Propyl Glycol (SYSTANE OP) Apply 1 drop to eye daily as needed (dry eyes).   Yes [provider]  pregabalin (LYRICA) 150 MG capsule Take 1 capsule (150 mg total) by mouth 2 (two) times daily. 04/24/18  Yes Tonia Ghent, MD  prochlorperazine (COMPAZINE) 10 MG tablet Take 1 tablet (10 mg total) by mouth every 6 (six) hours as needed (Nausea or vomiting). 05/22/18  Yes Earlie Server, MD  pyridOXINE (VITAMIN B-6) 100 MG tablet Take 1 tablet (100 mg total) by mouth daily. 04/28/17  Yes Earlie Server, MD  senna-docusate (SENOKOT-S) 8.6-50 MG tablet Take 2 tablets by mouth daily. 06/17/18  Yes Earlie Server, MD  sucralfate (CARAFATE) 1 GM/10ML suspension Take 10 mLs (1 g total) by mouth 4 (four) times daily. 04/24/18  Yes Tonia Ghent, MD  traMADol (ULTRAM) 50 MG tablet Take 1 tablet (50 mg total) by mouth every 6 (six) hours as  needed. 06/17/18  Yes Earlie Server, MD  traZODone (DESYREL) 50 MG tablet Take 0.5-1 tablets (25-50 mg total) by mouth at bedtime as needed for sleep. 06/03/18  Yes Tonia Ghent, MD  chlorhexidine (PERIDEX) 0.12 % solution Use as directed 15 mLs in the mouth or throat 2 (two) times daily.    [provider]  Diclofenac Sodium 1 % CREA Apply to left arm as needed for pain three times daily Patient not taking: Reported on 07/15/2018 04/23/17   Earlie Server, MD  potassium chloride (KLOR-CON) 20 MEQ packet Take 20 mEq by mouth daily. 03/09/18   Earlie Server, MD    Allergies as of 07/07/2018 - Review Complete 07/06/2018  Allergen Reaction Noted  . Cefuroxime Nausea Only 07/11/2015  . Codeine Nausea Only 01/22/2011  . Doxycycline Other (See Comments) 12/03/2013  . Gabapentin Other (See Comments) 12/19/2016  . Iohexol Itching 07/15/2013  . Other  Other (See Comments) 12/03/2013  . Oxycodone Other (See Comments) 12/19/2016  . Quinolones Swelling 12/03/2013  . Synvisc [hylan g-f 20] Swelling 01/22/2011    Family History  Problem Relation Age of Onset  . Breast cancer Mother 77  . Arthritis-Osteo Mother   . Breast cancer Sister 10  . Bladder Cancer Sister   . Bladder Cancer Father   . Tongue cancer Father   . Congenital heart disease Father   . Prostate cancer Brother   . Uterine cancer Maternal Aunt   . Lung cancer Paternal Uncle   . Leukemia Maternal Grandmother   . Liver cancer Paternal Grandmother   . Colon cancer Neg Hx     Social History   Socioeconomic History  . Marital status: Widowed    Spouse name: Not on file  . Number of children: 2  . Years of education: Not on file  . Highest education level: Master's degree (e.g., MA, MS, MEng, MEd, MSW, MBA)  Occupational History  . Not on file  Social Needs  . Financial resource strain: Not hard at all  . Food insecurity:    Worry: Never true    Inability: Never true  . Transportation needs:    Medical: No    Non-medical: No  Tobacco Use  . Smoking status: Former Smoker    Packs/day: 1.00    Years: 20.00    Pack years: 20.00    Types: Cigarettes    Last attempt to quit: 1977    Years since quitting: 43.2  . Smokeless tobacco: Never Used  Substance and Sexual Activity  . Alcohol use: No  . Drug use: Never  . Sexual activity: Not Currently  Lifestyle  . Physical activity:    Days per week: 2 days    Minutes per session: 30 min  . Stress: Not at all  Relationships  . Social connections:    Talks on phone: More than three times a week    Gets together: More than three times a week    Attends religious service: More than 4 times per year    Active member of club or organization: Yes    Attends meetings of clubs or organizations: More than 4 times per year    Relationship status: Widowed  . Intimate partner violence:    Fear of current or ex  partner: No    Emotionally abused: No    Physically abused: No    Forced sexual activity: No  Other Topics Concern  . Not on file  Social History Narrative   Marital Status- Widowed  Lives by herself    Employement- Retired Pharmacist, hospital   Exercise hx- Does PT     Review of Systems: See HPI, otherwise negative ROS  Physical Exam: There were no vitals taken for this visit. General:   Alert,  pleasant and cooperative in NAD Head:  Normocephalic and atraumatic. Neck:  Supple; no masses or thyromegaly. Lungs:  Clear throughout to auscultation, normal respiratory effort.    Heart:  +S1, +S2, Regular rate and rhythm, No edema. Abdomen:  Soft, nontender and nondistended. Normal bowel sounds, without guarding, and without rebound.   Neurologic:  Alert and  oriented x4;  grossly normal neurologically.  Impression/Plan: Larry Sierras Gabrielsen is here for an EGD for dysphagia  Risks, benefits, limitations, and alternatives regarding the procedure have been reviewed with the patient.  Questions have been answered.  All parties agreeable.   Virgel Manifold, MD  07/15/2018, 9:42 AM

## 2018-07-16 ENCOUNTER — Other Ambulatory Visit: Payer: Self-pay

## 2018-07-16 ENCOUNTER — Encounter: Payer: Self-pay | Admitting: Gastroenterology

## 2018-07-16 ENCOUNTER — Inpatient Hospital Stay: Payer: Medicare Other | Attending: Nurse Practitioner | Admitting: Nurse Practitioner

## 2018-07-16 ENCOUNTER — Other Ambulatory Visit: Payer: Self-pay | Admitting: Oncology

## 2018-07-16 VITALS — BP 126/72 | HR 69

## 2018-07-16 DIAGNOSIS — Z87891 Personal history of nicotine dependence: Secondary | ICD-10-CM | POA: Diagnosis not present

## 2018-07-16 DIAGNOSIS — J449 Chronic obstructive pulmonary disease, unspecified: Secondary | ICD-10-CM | POA: Diagnosis not present

## 2018-07-16 DIAGNOSIS — Z79899 Other long term (current) drug therapy: Secondary | ICD-10-CM

## 2018-07-16 DIAGNOSIS — G629 Polyneuropathy, unspecified: Secondary | ICD-10-CM | POA: Diagnosis not present

## 2018-07-16 DIAGNOSIS — E46 Unspecified protein-calorie malnutrition: Secondary | ICD-10-CM | POA: Insufficient documentation

## 2018-07-16 DIAGNOSIS — I952 Hypotension due to drugs: Secondary | ICD-10-CM | POA: Insufficient documentation

## 2018-07-16 DIAGNOSIS — C155 Malignant neoplasm of lower third of esophagus: Secondary | ICD-10-CM | POA: Insufficient documentation

## 2018-07-16 DIAGNOSIS — Z9221 Personal history of antineoplastic chemotherapy: Secondary | ICD-10-CM | POA: Diagnosis not present

## 2018-07-16 DIAGNOSIS — R740 Nonspecific elevation of levels of transaminase and lactic acid dehydrogenase [LDH]: Secondary | ICD-10-CM | POA: Diagnosis not present

## 2018-07-16 DIAGNOSIS — Z7982 Long term (current) use of aspirin: Secondary | ICD-10-CM | POA: Diagnosis not present

## 2018-07-16 DIAGNOSIS — Z5112 Encounter for antineoplastic immunotherapy: Secondary | ICD-10-CM | POA: Diagnosis present

## 2018-07-16 DIAGNOSIS — C7951 Secondary malignant neoplasm of bone: Secondary | ICD-10-CM | POA: Diagnosis not present

## 2018-07-16 DIAGNOSIS — K5900 Constipation, unspecified: Secondary | ICD-10-CM | POA: Diagnosis not present

## 2018-07-16 DIAGNOSIS — T451X5A Adverse effect of antineoplastic and immunosuppressive drugs, initial encounter: Secondary | ICD-10-CM | POA: Insufficient documentation

## 2018-07-16 DIAGNOSIS — Z923 Personal history of irradiation: Secondary | ICD-10-CM | POA: Insufficient documentation

## 2018-07-16 DIAGNOSIS — N390 Urinary tract infection, site not specified: Secondary | ICD-10-CM | POA: Diagnosis not present

## 2018-07-16 DIAGNOSIS — Z803 Family history of malignant neoplasm of breast: Secondary | ICD-10-CM | POA: Insufficient documentation

## 2018-07-16 DIAGNOSIS — R600 Localized edema: Secondary | ICD-10-CM | POA: Insufficient documentation

## 2018-07-16 DIAGNOSIS — I10 Essential (primary) hypertension: Secondary | ICD-10-CM | POA: Diagnosis not present

## 2018-07-16 DIAGNOSIS — Z9884 Bariatric surgery status: Secondary | ICD-10-CM | POA: Insufficient documentation

## 2018-07-16 DIAGNOSIS — C159 Malignant neoplasm of esophagus, unspecified: Secondary | ICD-10-CM

## 2018-07-16 MED ORDER — SODIUM CHLORIDE 0.9 % IV SOLN
Freq: Once | INTRAVENOUS | Status: AC
  Administered 2018-07-16: 15:00:00 via INTRAVENOUS
  Filled 2018-07-16: qty 250

## 2018-07-16 MED ORDER — HEPARIN SOD (PORK) LOCK FLUSH 100 UNIT/ML IV SOLN: 250.0000 [IU] | Freq: Once | INTRAVENOUS | Status: DC | PRN

## 2018-07-16 MED ORDER — SODIUM CHLORIDE 0.9% FLUSH
10.0000 mL | Freq: Once | INTRAVENOUS | Status: DC | PRN
  Filled 2018-07-16: qty 10

## 2018-07-16 NOTE — Patient Instructions (Signed)
Hold lasix and metoprolol tonight and tomorrow morning. We will see you back tomorrow for re-evaluation. It was a pleasure meeting you today and thank you for allowing me to participate in your care. -Beckey Rutter, NP

## 2018-07-16 NOTE — Progress Notes (Signed)
Symptom Management [place]  Telephone:([phone] [phone]  Patient Care Team: Tonia Ghent, MD as PCP - General (Family Medicine)   Name of the patient: [name]  [id number]  04-09-1935   Date of visit: 07/16/18  Diagnosis- Metastatic Esophageal Cancer  Chief complaint/ Reason for visit- Dizziness/Lightheadedness  Heme/Onc history:  Oncology History   Patient is a 84 year old female who has metastatic esophageal cancer with liver involvement.  Weight loss 20 pounds for the past year prior to diagnosis.  #EGD during admission showed partially obstructive esophageal mass at the distal part of esophagus. Biopsy was taken. Pathology showed esophageal adenocarcinoma. #US biopsy of liver lesion to proof distant metastasis.  Report family history of breast cancer,but she has been having routine mammograms.  # Lives alone.She's a former smoker.  # Molecular Testing:  #[facility] Cdx: No reportable alternations with companion diagnostic (CDx) claims.  MS stable Tumor mutation burden: Cannot be determined, CCND3 amplification: Equivocal.  Amended reports on 06/04/2017, Tumor mutational burden changed from can not be determined to "TMB -low" on the re-analyzed samples.  # HER 2 negative.   Current Treatment S/p 6 cycles of FOLFOX, PET scan showed excellent partial response from both chemotherapy and radiation, result was discussed with patient.  currently on 5-FU maintenance.  S/p palliative radiation in January 2019 # Developed esophageal stricture secondary to radiation in April and got dilation via endoscopy. EGD shoed Benign-appearing esophageal stenosis. This is a result of radiation therapy.- LA Grade C radiation esophagitis. - The previously noted esophageal mass in Nov 2018 was much decreased indicating good response to radiation/chemotherapy. - Normal stomach.- Normal examined jejunum.- No specimens collected.        Malignant neoplasm of lower third of esophagus (HCC)   03/29/2017 Initial Diagnosis    Malignant neoplasm of lower third of esophagus ([place])    01/27/2018 - 05/24/2018 Chemotherapy    The patient had PACLitaxel (TAXOL) 162 mg in sodium chloride 0.9 % 250 mL chemo infusion (</= 49m/m2), 80 mg/m2 = 162 mg, Intravenous,  Once, 4 of 7 cycles Dose modification: 65 mg/m2 (original dose 80 mg/m2, Cycle 2, Reason: Provider Judgment), 70 mg/m2 (original dose 80 mg/m2, Cycle 4, Reason: Provider Judgment), 70 mg/m2 (original dose 80 mg/m2, Cycle 4, Reason: Provider Judgment) Administration: 162 mg (01/27/2018), 162 mg (03/02/2018), 132 mg (03/09/2018), 162 mg (02/03/2018), 162 mg (02/09/2018), 162 mg (02/23/2018), 162 mg (03/23/2018), 162 mg (03/30/2018), 162 mg (04/06/2018), 138 mg (04/27/2018), 138 mg (05/04/2018), 138 mg (05/11/2018) ramucirumab (CYRAMZA) 700 mg in sodium chloride 0.9 % 180 mL chemo infusion, 8 mg/kg = 700 mg, Intravenous, Once, 4 of 7 cycles Administration: 700 mg (01/27/2018), 700 mg (03/09/2018), 700 mg (02/09/2018), 700 mg (02/23/2018), 700 mg (03/23/2018), 700 mg (04/06/2018), 700 mg (04/27/2018), 700 mg (05/11/2018)  for chemotherapy treatment.      Esophageal adenocarcinoma ([place]    Initial Diagnosis    Esophageal adenocarcinoma ([place]    05/27/2018 -  Chemotherapy    The patient had pembrolizumab (KEYTRUDA) 200 mg in sodium chloride 0.9 % 50 mL chemo infusion, 200 mg, Intravenous, Once, 3 of 5 cycles Administration: 200 mg (05/27/2018), 200 mg (06/17/2018), 200 mg (07/08/2018)  for chemotherapy treatment.      Interval history- [name], 84year old female, with above history of metastatic esophageal cancer, currently on second line Keytruda, presents to symptom management clinic for lightheadedness.  Symptoms occurred today while patient was participating in care program physical therapy.  She describes symptoms  as seeing black spots and feeling as though the room was spinning.   Symptoms resolved when she sat down.  No symptoms currently. No loss of consciousness. No shortness of breath or chest pain.  She did take her blood pressure medication, metoprolol-xr 77m, this morning. Ate breakfast this morning.   Oral intake has been significantly reduced lately.  Patient states she has poor appetite and is not interested in eating.  Currently on Marinol for appetite stimulation but has not noted significant effect.  She becomes tired preparing meals. Weight has been generally stable since time of diagnosis.   She underwent EGD with dilation yesterday.  Stricture appears significantly improved from previous.  She denies palpitations, fever, shortness of breath.  Denies any voice changes or emesis.  Has difficulty swallowing, nausea, reflux, or pain.  Additionally, she complains that her peripheral neuropathy has been worse recently.   ECOG FS:2 - Symptomatic, <50% confined to bed  Review of systems- Review of Systems  Constitutional: Positive for malaise/fatigue and weight loss. Negative for chills, diaphoresis and fever.  HENT: Negative for congestion, hearing loss, sinus pain, sore throat and tinnitus.   Eyes: Negative for blurred vision, double vision, photophobia and pain.  Respiratory: Negative for cough, hemoptysis, sputum production, shortness of breath and wheezing.   Cardiovascular: Negative for chest pain, palpitations, orthopnea, claudication, leg swelling and PND.  Gastrointestinal: Negative for abdominal pain, blood in stool, constipation, diarrhea, heartburn, melena, nausea and vomiting.  Genitourinary: Negative.   Musculoskeletal: Negative for back pain, falls, joint pain, myalgias and neck pain.  Skin: Negative.   Neurological: Positive for dizziness, tingling (slightly worse) and weakness. Negative for seizures, loss of consciousness and headaches.  Psychiatric/Behavioral: Positive for depression. The patient is not nervous/anxious and does not have  insomnia.     Current treatment-second line Keytruda 05/27/2018.  Most recent treatment on 07/08/2018.  Allergies  Allergen Reactions  . Cefuroxime Nausea Only  . Codeine Nausea Only  . Doxycycline Other (See Comments)    Lip swelling  . Gabapentin Other (See Comments)    Swelling.   . Iohexol Itching    07-15-13 pt developed itching on fingers after contrast given. Dr. OIrish Elderslooked at pt and said to put in system as allergy. BB  . Other Other (See Comments)    Contrast Dye- caused fever, chills, awful feeling Bandages - itching, rash  . Oxycodone Other (See Comments)    nausea  . Quinolones Swelling    Lip swelling  . Synvisc [Hylan G-F 20] Swelling    Past Medical History:  Diagnosis Date  . Arthritis   . Asthma   . COPD (chronic obstructive pulmonary disease) ([place]   . Dysphonia   . Dyspnea   . Esophageal cancer ([place]   . GERD (gastroesophageal reflux disease)   . Heart murmur   . History of kidney stones   . Hypertension   . Hypokalemia 10/07/2017  . Melanoma ([place]   . OAB (overactive bladder)   . OSA on CPAP   . 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 Personal history of chemotherapy    esophageal cancer  . Personal history of radiation therapy    esophageal cancer  . Pupil asymmetry    R pupil defect  . Skin cancer   . Sleep apnea   . Spinal cord abscess     Past Surgical History:  Procedure Laterality Date  . ABDOMINAL HYSTERECTOMY    . ANTERIOR CERVICAL DECOMP/DISCECTOMY FUSION  N/A 07/16/2013   Procedure: ANTERIOR CERVICAL DECOMPRESSION/DISCECTOMY FUSION mutlipleLEVELS C4-7;  Surgeon: Erline Levine, MD;  Location: [place] [facility] ORS;  Service: Neurosurgery;  Laterality: N/A;  . APPENDECTOMY    . carpel tunn Bilateral   . CATARACT EXTRACTION    . CATARACT EXTRACTION, BILATERAL    . CHOLECYSTECTOMY    . dental implant    . ESOPHAGOGASTRODUODENOSCOPY Left 03/21/2017   Procedure: ESOPHAGOGASTRODUODENOSCOPY (EGD);  Surgeon: Virgel Manifold, MD;   [facility];  Service: Endoscopy;  Laterality: Left;  . ESOPHAGOGASTRODUODENOSCOPY (EGD) WITH PROPOFOL N/A 08/20/2017   Procedure: ESOPHAGOGASTRODUODENOSCOPY (EGD) WITH PROPOFOL;  Surgeon: Virgel Manifold, MD;  Location: [facility] ENDOSCOPY;  Service: Endoscopy;  Laterality: N/A;  . ESOPHAGOGASTRODUODENOSCOPY (EGD) WITH PROPOFOL N/A 09/19/2017   Procedure: ESOPHAGOGASTRODUODENOSCOPY (EGD) WITH PROPOFOL;  Surgeon: Virgel Manifold, MD;  Location: [facility] ENDOSCOPY;  Service: Endoscopy;  Laterality: N/A;  . ESOPHAGOGASTRODUODENOSCOPY (EGD) WITH PROPOFOL N/A 10/03/2017   Procedure: ESOPHAGOGASTRODUODENOSCOPY (EGD) WITH PROPOFOL;  Surgeon: Virgel Manifold, MD;  Location: [facility] ENDOSCOPY;  Service: Endoscopy;  Laterality: N/A;  . ESOPHAGOGASTRODUODENOSCOPY (EGD) WITH PROPOFOL N/A 10/22/2017   Procedure: ESOPHAGOGASTRODUODENOSCOPY (EGD) WITH PROPOFOL;  Surgeon: Virgel Manifold, MD;  Location: [facility] ENDOSCOPY;  Service: Endoscopy;  Laterality: N/A;  . ESOPHAGOGASTRODUODENOSCOPY (EGD) WITH PROPOFOL N/A 11/18/2017   Procedure: ESOPHAGOGASTRODUODENOSCOPY (EGD) WITH PROPOFOL;  Surgeon: Virgel Manifold, MD;  Location: [facility] ENDOSCOPY;  Service: Endoscopy;  Laterality: N/A;  . ESOPHAGOGASTRODUODENOSCOPY (EGD) WITH PROPOFOL N/A 12/17/2017   Procedure: ESOPHAGOGASTRODUODENOSCOPY (EGD) WITH PROPOFOL;  Surgeon: Virgel Manifold, MD;  Location: [facility] ENDOSCOPY;  Service: Endoscopy;  Laterality: N/A;  . ESOPHAGOGASTRODUODENOSCOPY (EGD) WITH PROPOFOL N/A 01/08/2018   Procedure: ESOPHAGOGASTRODUODENOSCOPY (EGD) WITH PROPOFOL;  Surgeon: Virgel Manifold, MD;  Location: [facility] ENDOSCOPY;  Service: Endoscopy;  Laterality: N/A;  . ESOPHAGOGASTRODUODENOSCOPY (EGD) WITH PROPOFOL N/A 01/22/2018   Procedure: ESOPHAGOGASTRODUODENOSCOPY (EGD) WITH PROPOFOL;  Surgeon: Virgel Manifold, MD;  Location: [facility] ENDOSCOPY;  Service: Endoscopy;  Laterality: N/A;  . EYE SURGERY    . GASTRIC BYPASS    . HERNIA  REPAIR    . HIATAL HERNIA REPAIR    . JOINT REPLACEMENT Bilateral    knee  . MELANOMA EXCISION    . PORTA CATH INSERTION N/A 04/07/2017   Procedure: PORTA CATH INSERTION;  Surgeon: Algernon Huxley, MD;  Location: [place] CV LAB;  Service: Cardiovascular;  Laterality: N/A;  . POSTERIOR CERVICAL FUSION/FORAMINOTOMY N/A 07/28/2013   Procedure: Cervical four-seven  posterior cervical fusion;  Surgeon: Erline Levine, MD;  Location: [place] NEURO ORS;  Service: Neurosurgery;  Laterality: N/A;  . TONSILLECTOMY    . TOTAL KNEE ARTHROPLASTY      Social History   Socioeconomic History  . Marital status: Widowed    Spouse name: Not on file  . Number of children: 2  . Years of education: Not on file  . Highest education level: Master's degree (e.g., MA, MS, MEng, MEd, MSW, MBA)  Occupational History  . Not on file  Social Needs  . Financial resource strain: Not hard at all  . Food insecurity:    Worry: Never true    Inability: Never true  . Transportation needs:    Medical: No    Non-medical: No  Tobacco Use  . Smoking status: Former Smoker    Packs/day: 1.00    Years: 20.00    Pack years: 20.00    Types: Cigarettes    Last attempt to quit: 1977    Years since quitting: 43.2  .  Smokeless tobacco: Never Used  Substance and Sexual Activity  . Alcohol use: No  . Drug use: Never  . Sexual activity: Not Currently  Lifestyle  . Physical activity:    Days per week: 2 days    Minutes per session: 30 min  . Stress: Not at all  Relationships  . Social connections:    Talks on phone: More than three times a week    Gets together: More than three times a week    Attends religious service: More than 4 times per year    Active member of club or organization: Yes    Attends meetings of clubs or organizations: More than 4 times per year    Relationship status: Widowed  . Intimate partner violence:    Fear of current or ex partner: No    Emotionally abused: No    Physically abused: No     Forced sexual activity: No  Other Topics Concern  . Not on file  Social History Narrative   Marital Status- Widowed    Lives by herself    Employement- Retired Pharmacist, hospital   Exercise hx- Does PT     Family History  Problem Relation Age of Onset  . Breast cancer Mother 7  . Arthritis-Osteo Mother   . Breast cancer Sister 38  . Bladder Cancer Sister   . Bladder Cancer Father   . Tongue cancer Father   . Congenital heart disease Father   . Prostate cancer Brother   . Uterine cancer Maternal Aunt   . Lung cancer Paternal Uncle   . Leukemia Maternal Grandmother   . Liver cancer Paternal Grandmother   . Colon cancer Neg Hx      Current Outpatient Medications:  .  albuterol (PROVENTIL HFA;VENTOLIN HFA) 108 (90 Base) MCG/ACT inhaler, Inhale 2 puffs into the lungs every 4 (four) hours as needed for wheezing or shortness of breath., Disp: , Rfl:  .  calcium carbonate (TUMS EX) 750 MG chewable tablet, Chew 1 tablet by mouth as needed for heartburn. , Disp: , Rfl:  .  cephALEXin (KEFLEX) 500 MG capsule, Take 1 capsule (500 mg total) by mouth 4 (four) times daily., Disp: 20 capsule, Rfl: 0 .  chlorhexidine (PERIDEX) 0.12 % solution, Use as directed 15 mLs in the mouth or throat 2 (two) times daily., Disp: , Rfl:  .  Cholecalciferol (VITAMIN D) 2000 UNITS CAPS, Take 2,000 Units by mouth daily., Disp: , Rfl:  .  cyanocobalamin 500 MCG tablet, Take 500 mcg every other day by mouth., Disp: , Rfl:  .  Diclofenac Sodium 1 % CREA, Apply to left arm as needed for pain three times daily, Disp: 120 g, Rfl: 0 .  dronabinol (MARINOL) 5 MG capsule, Take 1 capsule (5 mg total) by mouth 2 (two) times daily before a meal., Disp: 60 capsule, Rfl: 0 .  fluticasone (FLONASE) 50 MCG/ACT nasal spray, Place 1-2 sprays into both nostrils daily., Disp: 16 g, Rfl: 5 .  furosemide (LASIX) 20 MG tablet, TAKE 1-2 TABLETS BY MOUTH DAILY AS NEEDED FOR EDEMA, Disp: 180 tablet, Rfl: 1 .  hydrocortisone cream 1 %, Apply 1  application topically daily as needed for itching. , Disp: , Rfl:  .  loratadine (CLARITIN) 10 MG tablet, Take 10 mg by mouth daily., Disp: , Rfl:  .  metoprolol succinate (TOPROL-XL) 50 MG 24 hr tablet, TAKE 1 TABLET BY MOUTH DAILY, Disp: 90 tablet, Rfl: 1 .  Multiple Vitamins-Minerals (MULTIVITAMIN ADULT) CHEW, Chew 1  tablet by mouth daily. , Disp: , Rfl:  .  MYRBETRIQ 25 MG TB24 tablet, TAKE ONE TABLET EVERY DAY, Disp: 30 tablet, Rfl: 2 .  ondansetron (ZOFRAN) 8 MG tablet, Take 1 tablet (8 mg total) by mouth 2 (two) times daily as needed (Nausea or vomiting)., Disp: 30 tablet, Rfl: 3 .  pantoprazole (PROTONIX) 40 MG tablet, Take 1 tablet (40 mg total) by mouth 2 (two) times daily before a meal., Disp: , Rfl:  .  Polyethyl Glycol-Propyl Glycol (SYSTANE OP), Apply 1 drop to eye daily as needed (dry eyes)., Disp: , Rfl:  .  potassium chloride (KLOR-CON) 20 MEQ packet, Take 20 mEq by mouth daily., Disp: 30 packet, Rfl: 1 .  pregabalin (LYRICA) 150 MG capsule, Take 1 capsule (150 mg total) by mouth 2 (two) times daily., Disp: , Rfl:  .  prochlorperazine (COMPAZINE) 10 MG tablet, Take 1 tablet (10 mg total) by mouth every 6 (six) hours as needed (Nausea or vomiting)., Disp: 30 tablet, Rfl: 1 .  pyridOXINE (VITAMIN B-6) 100 MG tablet, Take 1 tablet (100 mg total) by mouth daily., Disp: 30 tablet, Rfl: 3 .  senna-docusate (SENOKOT-S) 8.6-50 MG tablet, Take 2 tablets by mouth daily., Disp: 60 tablet, Rfl: 0 .  traMADol (ULTRAM) 50 MG tablet, Take 1 tablet (50 mg total) by mouth every 6 (six) hours as needed., Disp: 60 tablet, Rfl: 0 .  traZODone (DESYREL) 50 MG tablet, Take 0.5-1 tablets (25-50 mg total) by mouth at bedtime as needed for sleep., Disp: , Rfl:  No current facility-administered medications for this visit.   Facility-Administered Medications Ordered in Other Visits:  .  sodium chloride flush (NS) 0.9 % injection 10 mL, 10 mL, Intravenous, PRN, Earlie Server, MD, 10 mL at 01/26/18 0904  Physical  exam:  Vitals:   07/16/18 1406 07/16/18 1412 07/16/18 1558  BP: 108/65 100/70 126/72  Pulse: 76 72 69   Physical Exam Constitutional:      General: She is not in acute distress.    Comments: Fatigued appearing  HENT:     Head: Normocephalic and atraumatic.     Nose: No rhinorrhea.     Mouth/Throat:     Mouth: Mucous membranes are dry.     Pharynx: Oropharynx is clear.  Eyes:     General: Scleral icterus present.     Conjunctiva/sclera: Conjunctivae normal.  Neck:     Musculoskeletal: Neck supple.  Cardiovascular:     Rate and Rhythm: Normal rate and regular rhythm.     Heart sounds: Murmur present.  Pulmonary:     Effort: Pulmonary effort is normal.     Breath sounds: Normal breath sounds. No wheezing.  Abdominal:     General: There is no distension.     Tenderness: There is no abdominal tenderness.  Musculoskeletal:     Right lower leg: Edema present.     Left lower leg: Edema present.  Skin:    General: Skin is warm and dry.  Neurological:     Mental Status: She is alert and oriented to person, place, and time.     Comments: Altered word finding  Psychiatric:        Mood and Affect: Mood normal.        Behavior: Behavior normal.        Cognition and Memory: Memory is impaired.     Comments: Altered word finding     CMP Latest Ref Rng & Units 07/08/2018  Glucose 70 - 99 mg/dL 98  BUN 8 - 23 mg/dL 16  Creatinine 0.44 - 1.00 mg/dL 0.97  Sodium 135 - 145 mmol/L 139  Potassium 3.5 - 5.1 mmol/L 4.1  Chloride 98 - 111 mmol/L 101  CO2 22 - 32 mmol/L 30  Calcium 8.9 - 10.3 mg/dL 8.4(L)  Total Protein 6.5 - 8.1 g/dL 6.9  Total Bilirubin 0.3 - 1.2 mg/dL 0.6  Alkaline Phos 38 - 126 U/L 96  AST 15 - 41 U/L 33  ALT 0 - 44 U/L 15   CBC Latest Ref Rng & Units 07/08/2018  WBC 4.0 - 10.5 K/uL 7.0  Hemoglobin 12.0 - 15.0 g/dL 11.7(L)  Hematocrit 36.0 - 46.0 % 37.3  Platelets 150 - 400 K/uL 266    No images are attached to the encounter.  US Venous Img Lower  Bilateral  Result Date: 07/10/2018 CLINICAL DATA:  Bilateral lower extremity edema. History of esophageal carcinoma. EXAM: BILATERAL LOWER EXTREMITY VENOUS DOPPLER ULTRASOUND TECHNIQUE: Gray-scale sonography with graded compression, as well as color Doppler and duplex ultrasound were performed to evaluate the lower extremity deep venous systems from the level of the common femoral vein and including the common femoral, femoral, profunda femoral, popliteal and calf veins including the posterior tibial, peroneal and gastrocnemius veins when visible. The superficial great saphenous vein was also interrogated. Spectral Doppler was utilized to evaluate flow at rest and with distal augmentation maneuvers in the common femoral, femoral and popliteal veins. COMPARISON:  None. FINDINGS: RIGHT LOWER EXTREMITY Common Femoral Vein: No evidence of thrombus. Normal compressibility, respiratory phasicity and response to augmentation. Saphenofemoral Junction: No evidence of thrombus. Normal compressibility and flow on color Doppler imaging. Profunda Femoral Vein: No evidence of thrombus. Normal compressibility and flow on color Doppler imaging. Femoral Vein: No evidence of thrombus. Normal compressibility, respiratory phasicity and response to augmentation. Popliteal Vein: No evidence of thrombus. Normal compressibility, respiratory phasicity and response to augmentation. Calf Veins: No evidence of thrombus. Normal compressibility and flow on color Doppler imaging. Superficial Great Saphenous Vein: No evidence of thrombus. Normal compressibility. Venous Reflux:  None. Other Findings: No evidence of superficial thrombophlebitis or abnormal fluid collection. LEFT LOWER EXTREMITY Common Femoral Vein: No evidence of thrombus. Normal compressibility, respiratory phasicity and response to augmentation. Saphenofemoral Junction: No evidence of thrombus. Normal compressibility and flow on color Doppler imaging. Profunda Femoral Vein: No  evidence of thrombus. Normal compressibility and flow on color Doppler imaging. Femoral Vein: No evidence of thrombus. Normal compressibility, respiratory phasicity and response to augmentation. Popliteal Vein: No evidence of thrombus. Normal compressibility, respiratory phasicity and response to augmentation. Calf Veins: No evidence of thrombus. Normal compressibility and flow on color Doppler imaging. Superficial Great Saphenous Vein: No evidence of thrombus. Normal compressibility. Venous Reflux:  None. Other Findings: No evidence of superficial thrombophlebitis or abnormal fluid collection. IMPRESSION: No evidence of bilateral lower extremity deep venous thrombosis. Electronically Signed   By: Aletta Edouard M.D.   On: 07/10/2018 17:46    Assessment and plan- Patient is a 84 y.o. female diagnosed with metastatic esophageal cancer who presents to symptom management clinic for lightheadedness.  1.  Hypotension- suspect secondary to lasix and metoprolol and poor oral intake. Hypotensive in clinic. Gave 1L IV fluids with improvement in symptoms and blood pressure readings. I have reached out to her PCP for input regarding blood pressure medication. Given her age and frailty, I will have her return to clinic tomorrow for labs and re-evaluation, possibly more IV fluids.   rtc tomorrow for labs & re-evaluation in  Symptom Management    Visit Diagnosis 1. Esophageal adenocarcinoma ([place])   2. Hypotension due to drugs     Patient expressed understanding and was in agreement with this plan. She also understands that She can call clinic at any time with any questions, concerns, or complaints.   Thank you for allowing me to participate in the care of this very pleasant patient.   Beckey Rutter, DNP, AGNP-C [place] at [facility] [phone] (work cell) [phone] (office)  CC: Dr. Tasia Catchings

## 2018-07-17 ENCOUNTER — Other Ambulatory Visit: Payer: Self-pay

## 2018-07-17 ENCOUNTER — Encounter: Payer: Self-pay | Admitting: Nurse Practitioner

## 2018-07-17 ENCOUNTER — Telehealth: Payer: Self-pay | Admitting: Family Medicine

## 2018-07-17 ENCOUNTER — Inpatient Hospital Stay (HOSPITAL_BASED_OUTPATIENT_CLINIC_OR_DEPARTMENT_OTHER): Payer: Medicare Other | Admitting: Nurse Practitioner

## 2018-07-17 ENCOUNTER — Inpatient Hospital Stay: Payer: Medicare Other

## 2018-07-17 VITALS — BP 156/77 | HR 76 | Temp 98.1°F | Resp 18

## 2018-07-17 DIAGNOSIS — C155 Malignant neoplasm of lower third of esophagus: Secondary | ICD-10-CM | POA: Diagnosis not present

## 2018-07-17 DIAGNOSIS — R609 Edema, unspecified: Secondary | ICD-10-CM

## 2018-07-17 DIAGNOSIS — C159 Malignant neoplasm of esophagus, unspecified: Secondary | ICD-10-CM

## 2018-07-17 DIAGNOSIS — I9589 Other hypotension: Secondary | ICD-10-CM

## 2018-07-17 DIAGNOSIS — Z79899 Other long term (current) drug therapy: Secondary | ICD-10-CM

## 2018-07-17 DIAGNOSIS — C7951 Secondary malignant neoplasm of bone: Secondary | ICD-10-CM

## 2018-07-17 DIAGNOSIS — R29818 Other symptoms and signs involving the nervous system: Secondary | ICD-10-CM

## 2018-07-17 DIAGNOSIS — Z87891 Personal history of nicotine dependence: Secondary | ICD-10-CM

## 2018-07-17 DIAGNOSIS — I10 Essential (primary) hypertension: Secondary | ICD-10-CM

## 2018-07-17 DIAGNOSIS — Z9221 Personal history of antineoplastic chemotherapy: Secondary | ICD-10-CM

## 2018-07-17 DIAGNOSIS — I959 Hypotension, unspecified: Secondary | ICD-10-CM

## 2018-07-17 DIAGNOSIS — Z5112 Encounter for antineoplastic immunotherapy: Secondary | ICD-10-CM | POA: Diagnosis not present

## 2018-07-17 DIAGNOSIS — N3000 Acute cystitis without hematuria: Secondary | ICD-10-CM

## 2018-07-17 DIAGNOSIS — N390 Urinary tract infection, site not specified: Secondary | ICD-10-CM | POA: Diagnosis not present

## 2018-07-17 DIAGNOSIS — Z9884 Bariatric surgery status: Secondary | ICD-10-CM

## 2018-07-17 DIAGNOSIS — E876 Hypokalemia: Secondary | ICD-10-CM

## 2018-07-17 DIAGNOSIS — Z803 Family history of malignant neoplasm of breast: Secondary | ICD-10-CM

## 2018-07-17 DIAGNOSIS — Z7982 Long term (current) use of aspirin: Secondary | ICD-10-CM

## 2018-07-17 DIAGNOSIS — Z923 Personal history of irradiation: Secondary | ICD-10-CM

## 2018-07-17 DIAGNOSIS — J449 Chronic obstructive pulmonary disease, unspecified: Secondary | ICD-10-CM

## 2018-07-17 LAB — COMPREHENSIVE METABOLIC PANEL
ALT: 19 U/L (ref 0–44)
AST: 41 U/L (ref 15–41)
Albumin: 2.7 g/dL — ABNORMAL LOW (ref 3.5–5.0)
Alkaline Phosphatase: 97 U/L (ref 38–126)
Anion gap: 4 — ABNORMAL LOW (ref 5–15)
BILIRUBIN TOTAL: 0.7 mg/dL (ref 0.3–1.2)
BUN: 12 mg/dL (ref 8–23)
CO2: 30 mmol/L (ref 22–32)
Calcium: 8.1 mg/dL — ABNORMAL LOW (ref 8.9–10.3)
Chloride: 104 mmol/L (ref 98–111)
Creatinine, Ser: 0.57 mg/dL (ref 0.44–1.00)
GFR calc Af Amer: >60 mL/min (ref >60)
GFR calc non Af Amer: >60 mL/min (ref >60)
Glucose, Bld: 98 mg/dL (ref 70–99)
Potassium: 4.1 mmol/L (ref 3.5–5.1)
Sodium: 138 mmol/L (ref 135–145)
Total Protein: 6.6 g/dL (ref 6.5–8.1)

## 2018-07-17 LAB — URINALYSIS, COMPLETE (UACMP) WITH MICROSCOPIC
Bilirubin Urine: NEGATIVE
GLUCOSE, UA: NEGATIVE mg/dL
Hgb urine dipstick: NEGATIVE
Ketones, ur: NEGATIVE mg/dL
Nitrite: NEGATIVE
Protein, ur: NEGATIVE mg/dL
Specific Gravity, Urine: 1.014 (ref 1.005–1.030)
pH: 7 (ref 5.0–8.0)

## 2018-07-17 LAB — CBC WITH DIFFERENTIAL/PLATELET
Abs Immature Granulocytes: 0.06 10*3/uL (ref 0.00–0.07)
Basophils Absolute: 0 10*3/uL (ref 0.0–0.1)
Basophils Relative: 0 %
Eosinophils Absolute: 0.1 10*3/uL (ref 0.0–0.5)
Eosinophils Relative: 1 %
HCT: 32.7 % — ABNORMAL LOW (ref 36.0–46.0)
Hemoglobin: 10.1 g/dL — ABNORMAL LOW (ref 12.0–15.0)
Immature Granulocytes: 1 %
Lymphocytes Relative: 8 %
Lymphs Abs: 0.6 10*3/uL — ABNORMAL LOW (ref 0.7–4.0)
MCH: 29.7 pg (ref 26.0–34.0)
MCHC: 30.9 g/dL (ref 30.0–36.0)
MCV: 96.2 fL (ref 80.0–100.0)
Monocytes Absolute: 0.8 10*3/uL (ref 0.1–1.0)
Monocytes Relative: 10 %
Neutro Abs: 6.1 10*3/uL (ref 1.7–7.7)
Neutrophils Relative %: 80 %
Platelets: 224 10*3/uL (ref 150–400)
RBC: 3.4 MIL/uL — ABNORMAL LOW (ref 3.87–5.11)
RDW: 17 % — ABNORMAL HIGH (ref 11.5–15.5)
WBC: 7.7 10*3/uL (ref 4.0–10.5)
nRBC: 0 % (ref 0.0–0.2)

## 2018-07-17 MED ORDER — FOSFOMYCIN TROMETHAMINE 3 G PO PACK: 3.0000 g | PACK | Freq: Once | ORAL | 0 refills | Status: AC

## 2018-07-17 MED ORDER — HEPARIN SOD (PORK) LOCK FLUSH 100 UNIT/ML IV SOLN
500.0000 [IU] | Freq: Once | INTRAVENOUS | Status: AC | PRN
  Administered 2018-07-17: 500 [IU]

## 2018-07-17 MED ORDER — SODIUM CHLORIDE 0.9 % IV SOLN
Freq: Once | INTRAVENOUS | Status: AC
  Administered 2018-07-17: 12:00:00 via INTRAVENOUS
  Filled 2018-07-17: qty 250

## 2018-07-17 NOTE — Telephone Encounter (Signed)
I am glad to continue as her attending with or without palliative care.  Please verify the patient is desiring this first.  Thanks.

## 2018-07-17 NOTE — Telephone Encounter (Signed)
Stacy advised 

## 2018-07-17 NOTE — Telephone Encounter (Signed)
[name] with [facility] called. Pt was in the hospital and they are recommending palliative care and [name] wanted to know if you were on board with this. She can be reached at [phone].

## 2018-07-17 NOTE — Progress Notes (Signed)
Symptom Management [place]  Telephone:([phone] [phone]  Patient Care Team: Tonia Ghent, MD as PCP - General (Family Medicine)   Name of the patient: [name]  [id number]  Jun 20, 1934   Date of visit: 07/17/18  Diagnosis-metastatic esophageal cancer  Chief complaint/ Reason for visit- Symptomatic Hypotension  Heme/Onc history:  Oncology History   Patient is a 84 year old female who has metastatic esophageal cancer with liver involvement.  Weight loss 20 pounds for the past year prior to diagnosis.  #EGD during admission showed partially obstructive esophageal mass at the distal part of esophagus. Biopsy was taken. Pathology showed esophageal adenocarcinoma. #US biopsy of liver lesion to proof distant metastasis.  Report family history of breast cancer,but she has been having routine mammograms.  # Lives alone.She's a former smoker.  # Molecular Testing:  #[employer] Cdx: No reportable alternations with companion diagnostic (CDx) claims.  MS stable Tumor mutation burden: Cannot be determined, CCND3 amplification: Equivocal.  Amended reports on 06/04/2017, Tumor mutational burden changed from can not be determined to "TMB -low" on the re-analyzed samples.  # HER 2 negative.   Current Treatment S/p 6 cycles of FOLFOX, PET scan showed excellent partial response from both chemotherapy and radiation, result was discussed with patient.  currently on 5-FU maintenance.  S/p palliative radiation in January 2019 # Developed esophageal stricture secondary to radiation in April and got dilation via endoscopy. EGD shoed Benign-appearing esophageal stenosis. This is a result of radiation therapy.- LA Grade C radiation esophagitis. - The previously noted esophageal mass in Nov 2018 was much decreased indicating good response to radiation/chemotherapy. - Normal stomach.- Normal examined jejunum.- No specimens collected.          Malignant neoplasm of lower third of esophagus (HCC)   03/29/2017 Initial Diagnosis    Malignant neoplasm of lower third of esophagus ([place])    01/27/2018 - 05/24/2018 Chemotherapy    The patient had PACLitaxel (TAXOL) 162 mg in sodium chloride 0.9 % 250 mL chemo infusion (</= 35m/m2), 80 mg/m2 = 162 mg, Intravenous,  Once, 4 of 7 cycles Dose modification: 65 mg/m2 (original dose 80 mg/m2, Cycle 2, Reason: Provider Judgment), 70 mg/m2 (original dose 80 mg/m2, Cycle 4, Reason: Provider Judgment), 70 mg/m2 (original dose 80 mg/m2, Cycle 4, Reason: Provider Judgment) Administration: 162 mg (01/27/2018), 162 mg (03/02/2018), 132 mg (03/09/2018), 162 mg (02/03/2018), 162 mg (02/09/2018), 162 mg (02/23/2018), 162 mg (03/23/2018), 162 mg (03/30/2018), 162 mg (04/06/2018), 138 mg (04/27/2018), 138 mg (05/04/2018), 138 mg (05/11/2018) ramucirumab (CYRAMZA) 700 mg in sodium chloride 0.9 % 180 mL chemo infusion, 8 mg/kg = 700 mg, Intravenous, Once, 4 of 7 cycles Administration: 700 mg (01/27/2018), 700 mg (03/09/2018), 700 mg (02/09/2018), 700 mg (02/23/2018), 700 mg (03/23/2018), 700 mg (04/06/2018), 700 mg (04/27/2018), 700 mg (05/11/2018)  for chemotherapy treatment.      Esophageal adenocarcinoma ([place]    Initial Diagnosis    Esophageal adenocarcinoma ([place]    05/27/2018 -  Chemotherapy    The patient had pembrolizumab (KEYTRUDA) 200 mg in sodium chloride 0.9 % 50 mL chemo infusion, 200 mg, Intravenous, Once, 3 of 5 cycles Administration: 200 mg (05/27/2018), 200 mg (06/17/2018), 200 mg (07/08/2018)  for chemotherapy treatment.      Interval history- [name], 84year old female, with above history of metastatic esophageal cancer, currently on second line Keytruda, presents to symptom management clinic for symptomatic hypotension.  She was seen in clinic yesterday after feeling lightheaded and dizzy while participating  in physical therapy.  She received IV fluids and was asked to hold extended release  metoprolol and Lasix.  Today, she reports that she felt better last night but continues to have dizziness.  She states that symptoms occur both at rest and during movement. She was having these episodes prior to her EGD/dilation. Denies syncope or falls. She ate this morning. No diarrhea. Concerned of persistent leg swelling bilaterally.  ECOG FS:2 - Symptomatic, <50% confined to bed  Review of systems- Review of Systems  Constitutional: Positive for malaise/fatigue. Negative for chills, diaphoresis, fever and weight loss.  HENT: Negative for congestion, ear discharge, ear pain, hearing loss, nosebleeds, sinus pain, sore throat and tinnitus.   Eyes: Negative for blurred vision, double vision, photophobia and pain.  Respiratory: Negative for cough, sputum production, shortness of breath, wheezing and stridor.   Cardiovascular: Positive for leg swelling. Negative for chest pain, palpitations and orthopnea.  Gastrointestinal: Negative for abdominal pain, blood in stool, constipation, diarrhea, heartburn, melena, nausea and vomiting.  Genitourinary: Negative for dysuria, flank pain, frequency, hematuria and urgency.  Musculoskeletal: Negative for back pain, falls, joint pain, myalgias and neck pain.  Skin: Negative for itching and rash.  Neurological: Positive for dizziness and weakness. Negative for loss of consciousness and headaches.       Difficulty finding words- not new (several months)  Psychiatric/Behavioral: Positive for memory loss.     Current treatment- second line Keytruda started 05/27/2018. Most Recent treatment on 07/08/2018  Allergies  Allergen Reactions  . Cefuroxime Nausea Only  . Codeine Nausea Only  . Doxycycline Other (See Comments)    Lip swelling  . Gabapentin Other (See Comments)    Swelling.   . Iohexol Itching    07-15-13 pt developed itching on fingers after contrast given. Dr. Irish Elders looked at pt and said to put in system as allergy. BB  . Other Other (See Comments)     Contrast Dye- caused fever, chills, awful feeling Bandages - itching, rash  . Oxycodone Other (See Comments)    nausea  . Quinolones Swelling    Lip swelling  . Synvisc [Hylan G-F 20] Swelling    Past Medical History:  Diagnosis Date  . Arthritis   . Asthma   . COPD (chronic obstructive pulmonary disease) ([place])   . Dysphonia   . Dyspnea   . Esophageal cancer ([place])   . GERD (gastroesophageal reflux disease)   . Heart murmur   . History of kidney stones   . Hypertension   . Hypokalemia 10/07/2017  . Melanoma ([place])   . OAB (overactive bladder)   . OSA on CPAP   . 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 Personal history of chemotherapy    esophageal cancer  . Personal history of radiation therapy    esophageal cancer  . Pupil asymmetry    R pupil defect  . Skin cancer   . Sleep apnea   . Spinal cord abscess     Past Surgical History:  Procedure Laterality Date  . ABDOMINAL HYSTERECTOMY    . ANTERIOR CERVICAL DECOMP/DISCECTOMY FUSION N/A 07/16/2013   Procedure: ANTERIOR CERVICAL DECOMPRESSION/DISCECTOMY FUSION mutlipleLEVELS C4-7;  Surgeon: Erline Levine, MD;  Location: [place]UR[facility];  Service: Neurosurgery;  Laterality: N/A;  . APPENDECTOMY    . carpel tunn Bilateral   . CATARACT EXTRACTION    . CATARACT EXTRACTION, BILATERAL    . CHOLECYSTECTOMY    . dental implant    . ESOPHAGOGASTRODUODENOSCOPY Left  03/21/2017   Procedure: ESOPHAGOGASTRODUODENOSCOPY (EGD);  Surgeon: Virgel Manifold, MD;  Location: [facility] ENDOSCOPY;  Service: Endoscopy;  Laterality: Left;  . ESOPHAGOGASTRODUODENOSCOPY (EGD) WITH PROPOFOL N/A 08/20/2017   Procedure: ESOPHAGOGASTRODUODENOSCOPY (EGD) WITH PROPOFOL;  Surgeon: Virgel Manifold, MD;  Location: [facility] ENDOSCOPY;  Service: Endoscopy;  Laterality: N/A;  . ESOPHAGOGASTRODUODENOSCOPY (EGD) WITH PROPOFOL N/A 09/19/2017   Procedure: ESOPHAGOGASTRODUODENOSCOPY (EGD) WITH PROPOFOL;  Surgeon: Virgel Manifold, MD;  Location:  [facility] ENDOSCOPY;  Service: Endoscopy;  Laterality: N/A;  . ESOPHAGOGASTRODUODENOSCOPY (EGD) WITH PROPOFOL N/A 10/03/2017   Procedure: ESOPHAGOGASTRODUODENOSCOPY (EGD) WITH PROPOFOL;  Surgeon: Virgel Manifold, MD;  Location: [facility] ENDOSCOPY;  Service: Endoscopy;  Laterality: N/A;  . ESOPHAGOGASTRODUODENOSCOPY (EGD) WITH PROPOFOL N/A 10/22/2017   Procedure: ESOPHAGOGASTRODUODENOSCOPY (EGD) WITH PROPOFOL;  Surgeon: Virgel Manifold, MD;  Location: [facility] ENDOSCOPY;  Service: Endoscopy;  Laterality: N/A;  . ESOPHAGOGASTRODUODENOSCOPY (EGD) WITH PROPOFOL N/A 11/18/2017   Procedure: ESOPHAGOGASTRODUODENOSCOPY (EGD) WITH PROPOFOL;  Surgeon: Virgel Manifold, MD;  Location: [facility] ENDOSCOPY;  Service: Endoscopy;  Laterality: N/A;  . ESOPHAGOGASTRODUODENOSCOPY (EGD) WITH PROPOFOL N/A 12/17/2017   Procedure: ESOPHAGOGASTRODUODENOSCOPY (EGD) WITH PROPOFOL;  Surgeon: Virgel Manifold, MD;  Location: [facility] ENDOSCOPY;  Service: Endoscopy;  Laterality: N/A;  . ESOPHAGOGASTRODUODENOSCOPY (EGD) WITH PROPOFOL N/A 01/08/2018   Procedure: ESOPHAGOGASTRODUODENOSCOPY (EGD) WITH PROPOFOL;  Surgeon: Virgel Manifold, MD;  Location: [facility] ENDOSCOPY;  Service: Endoscopy;  Laterality: N/A;  . ESOPHAGOGASTRODUODENOSCOPY (EGD) WITH PROPOFOL N/A 01/22/2018   Procedure: ESOPHAGOGASTRODUODENOSCOPY (EGD) WITH PROPOFOL;  Surgeon: Virgel Manifold, MD;  Location: [facility] ENDOSCOPY;  Service: Endoscopy;  Laterality: N/A;  . ESOPHAGOGASTRODUODENOSCOPY (EGD) WITH PROPOFOL N/A 07/15/2018   Procedure: ESOPHAGOGASTRODUODENOSCOPY (EGD) WITH PROPOFOL;  Surgeon: Virgel Manifold, MD;  Location: [facility] ENDOSCOPY;  Service: Endoscopy;  Laterality: N/A;  . EYE SURGERY    . GASTRIC BYPASS    . HERNIA REPAIR    . HIATAL HERNIA REPAIR    . JOINT REPLACEMENT Bilateral    knee  . MELANOMA EXCISION    . PORTA CATH INSERTION N/A 04/07/2017   Procedure: PORTA CATH INSERTION;  Surgeon: Algernon Huxley, MD;  Location: [facility] CV LAB;   Service: Cardiovascular;  Laterality: N/A;  . POSTERIOR CERVICAL FUSION/FORAMINOTOMY N/A 07/28/2013   Procedure: Cervical four-seven  posterior cervical fusion;  Surgeon: Erline Levine, MD;  Location: [facility]UR[facility] ORS;  Service: Neurosurgery;  Laterality: N/A;  . TONSILLECTOMY    . TOTAL KNEE ARTHROPLASTY      Social History   Socioeconomic History  . Marital status: Widowed    Spouse name: Not on file  . Number of children: 2  . Years of education: Not on file  . Highest education level: Master's degree (e.g., MA, MS, MEng, MEd, MSW, MBA)  Occupational History  . Not on file  Social Needs  . Financial resource strain: Not hard at all  . Food insecurity:    Worry: Never true    Inability: Never true  . Transportation needs:    Medical: No    Non-medical: No  Tobacco Use  . Smoking status: Former Smoker    Packs/day: 1.00    Years: 20.00    Pack years: 20.00    Types: Cigarettes    Last attempt to quit: 1977    Years since quitting: 43.2  . Smokeless tobacco: Never Used  Substance and Sexual Activity  . Alcohol use: No  . Drug use: Never  . Sexual activity: Not Currently  Lifestyle  . Physical activity:    Days  per week: 2 days    Minutes per session: 30 min  . Stress: Not at all  Relationships  . Social connections:    Talks on phone: More than three times a week    Gets together: More than three times a week    Attends religious service: More than 4 times per year    Active member of club or organization: Yes    Attends meetings of clubs or organizations: More than 4 times per year    Relationship status: Widowed  . Intimate partner violence:    Fear of current or ex partner: No    Emotionally abused: No    Physically abused: No    Forced sexual activity: No  Other Topics Concern  . Not on file  Social History Narrative   Marital Status- Widowed    Lives by herself    Employement- Retired Pharmacist, hospital   Exercise hx- Does PT     Family History  Problem Relation  Age of Onset  . Breast cancer Mother 94  . Arthritis-Osteo Mother   . Breast cancer Sister 27  . Bladder Cancer Sister   . Bladder Cancer Father   . Tongue cancer Father   . Congenital heart disease Father   . Prostate cancer Brother   . Uterine cancer Maternal Aunt   . Lung cancer Paternal Uncle   . Leukemia Maternal Grandmother   . Liver cancer Paternal Grandmother   . Colon cancer Neg Hx      Current Outpatient Medications:  .  albuterol (PROVENTIL HFA;VENTOLIN HFA) 108 (90 Base) MCG/ACT inhaler, Inhale 2 puffs into the lungs every 4 (four) hours as needed for wheezing or shortness of breath., Disp: , Rfl:  .  calcium carbonate (TUMS EX) 750 MG chewable tablet, Chew 1 tablet by mouth as needed for heartburn. , Disp: , Rfl:  .  cephALEXin (KEFLEX) 500 MG capsule, Take 1 capsule (500 mg total) by mouth 4 (four) times daily., Disp: 20 capsule, Rfl: 0 .  chlorhexidine (PERIDEX) 0.12 % solution, Use as directed 15 mLs in the mouth or throat 2 (two) times daily., Disp: , Rfl:  .  Cholecalciferol (VITAMIN D) 2000 UNITS CAPS, Take 2,000 Units by mouth daily., Disp: , Rfl:  .  cyanocobalamin 500 MCG tablet, Take 500 mcg every other day by mouth., Disp: , Rfl:  .  Diclofenac Sodium 1 % CREA, Apply to left arm as needed for pain three times daily, Disp: 120 g, Rfl: 0 .  dronabinol (MARINOL) 5 MG capsule, Take 1 capsule (5 mg total) by mouth 2 (two) times daily before a meal., Disp: 60 capsule, Rfl: 0 .  fluticasone (FLONASE) 50 MCG/ACT nasal spray, Place 1-2 sprays into both nostrils daily., Disp: 16 g, Rfl: 5 .  furosemide (LASIX) 20 MG tablet, TAKE 1-2 TABLETS BY MOUTH DAILY AS NEEDED FOR EDEMA, Disp: 180 tablet, Rfl: 1 .  hydrocortisone cream 1 %, Apply 1 application topically daily as needed for itching. , Disp: , Rfl:  .  loratadine (CLARITIN) 10 MG tablet, Take 10 mg by mouth daily., Disp: , Rfl:  .  metoprolol succinate (TOPROL-XL) 50 MG 24 hr tablet, TAKE 1 TABLET BY MOUTH DAILY, Disp:  90 tablet, Rfl: 1 .  Multiple Vitamins-Minerals (MULTIVITAMIN ADULT) CHEW, Chew 1 tablet by mouth daily. , Disp: , Rfl:  .  MYRBETRIQ 25 MG TB24 tablet, TAKE ONE TABLET EVERY DAY, Disp: 30 tablet, Rfl: 2 .  ondansetron (ZOFRAN) 8 MG tablet, Take 1  tablet (8 mg total) by mouth 2 (two) times daily as needed (Nausea or vomiting)., Disp: 30 tablet, Rfl: 3 .  pantoprazole (PROTONIX) 40 MG tablet, TAKE 1 TABLET BY MOUTH TWICE DAILY BEFORE A MEAL, Disp: 60 tablet, Rfl: 3 .  Polyethyl Glycol-Propyl Glycol (SYSTANE OP), Apply 1 drop to eye daily as needed (dry eyes)., Disp: , Rfl:  .  potassium chloride (KLOR-CON) 20 MEQ packet, Take 20 mEq by mouth daily., Disp: 30 packet, Rfl: 1 .  pregabalin (LYRICA) 150 MG capsule, Take 1 capsule (150 mg total) by mouth 2 (two) times daily., Disp: , Rfl:  .  prochlorperazine (COMPAZINE) 10 MG tablet, Take 1 tablet (10 mg total) by mouth every 6 (six) hours as needed (Nausea or vomiting)., Disp: 30 tablet, Rfl: 1 .  pyridOXINE (VITAMIN B-6) 100 MG tablet, Take 1 tablet (100 mg total) by mouth daily., Disp: 30 tablet, Rfl: 3 .  SENNA-PLUS 8.6-50 MG tablet, TAKE TWO TABLETS BY MOUTH EVERY DAY, Disp: 60 tablet, Rfl: 0 .  traMADol (ULTRAM) 50 MG tablet, Take 1 tablet (50 mg total) by mouth every 6 (six) hours as needed., Disp: 60 tablet, Rfl: 0 .  traZODone (DESYREL) 50 MG tablet, Take 0.5-1 tablets (25-50 mg total) by mouth at bedtime as needed for sleep., Disp: , Rfl:  No current facility-administered medications for this visit.   Facility-Administered Medications Ordered in Other Visits:  .  heparin lock flush 100 unit/mL, 250 Units, Intracatheter, Once PRN, Earlie Server, MD .  sodium chloride flush (NS) 0.9 % injection 10 mL, 10 mL, Intravenous, PRN, Earlie Server, MD, 10 mL at 01/26/18 0904 .  sodium chloride flush (NS) 0.9 % injection 10 mL, 10 mL, Intracatheter, Once PRN, Earlie Server, MD  Physical exam:  Vitals:   07/17/18 1035  BP: 101/70  Pulse: 88  Resp: 18  Temp: 98.1  F (36.7 C)  TempSrc: Tympanic   Physical Exam Constitutional:      General: She is not in acute distress.    Comments: Accompanied; appears stated age  HENT:     Head: Normocephalic.     Right Ear: Tympanic membrane, ear canal and external ear normal.     Left Ear: Tympanic membrane, ear canal and external ear normal.     Nose: No congestion.     Mouth/Throat:     Mouth: Mucous membranes are moist.     Pharynx: Oropharynx is clear.  Eyes:     General: No scleral icterus.    Extraocular Movements: Extraocular movements intact.     Conjunctiva/sclera: Conjunctivae normal.     Pupils: Pupils are equal, round, and reactive to light.  Neck:     Musculoskeletal: Normal range of motion and neck supple. No muscular tenderness.  Cardiovascular:     Rate and Rhythm: Normal rate and regular rhythm.  Pulmonary:     Effort: Pulmonary effort is normal.     Breath sounds: Normal breath sounds.  Abdominal:     General: There is no distension.     Palpations: Abdomen is soft.     Tenderness: There is no abdominal tenderness.  Musculoskeletal:     Right lower leg: Edema present.     Left lower leg: Edema present.  Skin:    General: Skin is warm and dry.  Neurological:     Mental Status: She is alert and oriented to person, place, and time.     Motor: Weakness present.     Gait: Gait abnormal.  Comments: Altered word finding  Psychiatric:        Attention and Perception: Attention normal.        Mood and Affect: Mood normal.        Cognition and Memory: Memory is impaired.      CMP Latest Ref Rng & Units 07/17/2018  Glucose 70 - 99 mg/dL 98  BUN 8 - 23 mg/dL 12  Creatinine 0.44 - 1.00 mg/dL 0.57  Sodium 135 - 145 mmol/L 138  Potassium 3.5 - 5.1 mmol/L 4.1  Chloride 98 - 111 mmol/L 104  CO2 22 - 32 mmol/L 30  Calcium 8.9 - 10.3 mg/dL 8.1(L)  Total Protein 6.5 - 8.1 g/dL 6.6  Total Bilirubin 0.3 - 1.2 mg/dL 0.7  Alkaline Phos 38 - 126 U/L 97  AST 15 - 41 U/L 41  ALT 0 - 44  U/L 19   CBC Latest Ref Rng & Units 07/17/2018  WBC 4.0 - 10.5 K/uL 7.7  Hemoglobin 12.0 - 15.0 g/dL 10.1(L)  Hematocrit 36.0 - 46.0 % 32.7(L)  Platelets 150 - 400 K/uL 224   Urinalysis    Component Value Date/Time   COLORURINE YELLOW (A) 07/17/2018 1258   APPEARANCEUR HAZY (A) 07/17/2018 1258   APPEARANCEUR Cloudy 05/14/2012 1436   LABSPEC 1.014 07/17/2018 1258   LABSPEC 1.028 05/14/2012 1436   PHURINE 7.0 07/17/2018 1258   GLUCOSEU NEGATIVE 07/17/2018 1258   GLUCOSEU Negative 05/14/2012 1436   HGBUR NEGATIVE 07/17/2018 [place] 07/17/2018 1258   BILIRUBINUR Negative 05/14/2012 1436   KETONESUR NEGATIVE 07/17/2018 1258   PROTEINUR NEGATIVE 07/17/2018 1258   UROBILINOGEN 0.2 07/31/2013 2049   NITRITE NEGATIVE 07/17/2018 1258   LEUKOCYTESUR TRACE (A) 07/17/2018 1258   LEUKOCYTESUR 2+ 05/14/2012 1436    No images are attached to the encounter.  US Venous Img Lower Bilateral  Result Date: 07/10/2018 CLINICAL DATA:  Bilateral lower extremity edema. History of esophageal carcinoma. EXAM: BILATERAL LOWER EXTREMITY VENOUS DOPPLER ULTRASOUND TECHNIQUE: Gray-scale sonography with graded compression, as well as color Doppler and duplex ultrasound were performed to evaluate the lower extremity deep venous systems from the level of the common femoral vein and including the common femoral, femoral, profunda femoral, popliteal and calf veins including the posterior tibial, peroneal and gastrocnemius veins when visible. The superficial great saphenous vein was also interrogated. Spectral Doppler was utilized to evaluate flow at rest and with distal augmentation maneuvers in the common femoral, femoral and popliteal veins. COMPARISON:  None. FINDINGS: RIGHT LOWER EXTREMITY Common Femoral Vein: No evidence of thrombus. Normal compressibility, respiratory phasicity and response to augmentation. Saphenofemoral Junction: No evidence of thrombus. Normal compressibility and flow on color  Doppler imaging. Profunda Femoral Vein: No evidence of thrombus. Normal compressibility and flow on color Doppler imaging. Femoral Vein: No evidence of thrombus. Normal compressibility, respiratory phasicity and response to augmentation. Popliteal Vein: No evidence of thrombus. Normal compressibility, respiratory phasicity and response to augmentation. Calf Veins: No evidence of thrombus. Normal compressibility and flow on color Doppler imaging. Superficial Great Saphenous Vein: No evidence of thrombus. Normal compressibility. Venous Reflux:  None. Other Findings: No evidence of superficial thrombophlebitis or abnormal fluid collection. LEFT LOWER EXTREMITY Common Femoral Vein: No evidence of thrombus. Normal compressibility, respiratory phasicity and response to augmentation. Saphenofemoral Junction: No evidence of thrombus. Normal compressibility and flow on color Doppler imaging. Profunda Femoral Vein: No evidence of thrombus. Normal compressibility and flow on color Doppler imaging. Femoral Vein: No evidence of thrombus. Normal compressibility, respiratory phasicity and  response to augmentation. Popliteal Vein: No evidence of thrombus. Normal compressibility, respiratory phasicity and response to augmentation. Calf Veins: No evidence of thrombus. Normal compressibility and flow on color Doppler imaging. Superficial Great Saphenous Vein: No evidence of thrombus. Normal compressibility. Venous Reflux:  None. Other Findings: No evidence of superficial thrombophlebitis or abnormal fluid collection. IMPRESSION: No evidence of bilateral lower extremity deep venous thrombosis. Electronically Signed   By: Aletta Edouard M.D.   On: 07/10/2018 17:46    Assessment and plan- Patient is a 84 y.o. female diagnosed with metastatic esophageal cancer who presents to symptom management clinic for symptomatic hypotension.  1. Hypotension-hypotensive in clinic again today.  Symptomatic.  Discussed with Dr. Tasia Catchings who recommended  infectious work-up given patient's atypical presentation previously.  Blood cultures negative.  UA consistent with UTI (see below).  IV fluids given in clinic with resolution of symptoms.  Advised patient to monitor blood pressures at home and to hold blood pressure medication if pressures < 120/80. Follow up with PCP.   2. UTI- Previously, had episode of profound weakness and fatigue and was found to have UTI and treated with Keflex for 7 days; no dysuria at that time. UA today in clinic consistent with UTI. Afebrile. no dysuria. Hypotension may be related. Start fosfomycin.   3. Leg Swelling- Venous duplex bilateral for lower leg swelling performed on 07/10/2018 negative for dvt. LVEF 55-65% on echo 03/2017. Suspect related to limited mobility complicated by peripheral neuropathy and dizziness. Start compression stockings which were provided today. Follow up with PCP.   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 Overall stable. Concerning for fall risk and driving hazard. Recommend further evaluation by pcp and consider referral to neurology for nerve conduction studies. Consider continuing home physical/occupational therapy.   4. Forgetfulness/Dizziness/Altered word finding- CT from December 2019 for altered mental status did not reveal metastatic disease or acute abnormality. Mild chronic microvascular ischemic changes and mild volume loss. Discussed with Dr. Tasia Catchings who recommends MRI brain in setting of metastatic esophageal cancer to evaluate for brain metastases (see below).    5. Subacute infarct-MRI revealed small subacute white matter infarct in posterior left frontal lobe.  No evidence of metastatic disease.  Mild to moderate chronic small vessel ischemic disease.  Dr. Tasia Catchings recommends starting aspirin 81 mg daily and following up with PCP for discussion of continued risk reduction including lipid panel and statin.  Follow-up with PCP next week.  Follow-up with Dr. Tasia Catchings for consideration of Keytruda and reevaluation on  07/29/2018 as scheduled.  Return to clinic sooner if symptoms persist or worsen.   Visit Diagnosis 1. Symptomatic hypotension   2. Malignant neoplasm of esophagus, unspecified location ([place])   3. Other symptoms and signs involving the nervous system   4. Acute cystitis without hematuria   5. Dependent edema     Patient expressed understanding and was in agreement with this plan. She also understands that She can call clinic at any time with any questions, concerns, or complaints.   Thank you for allowing me to participate in the care of this very pleasant patient.   Beckey Rutter, DNP, AGNP-C [facility] [phone] (work cell) [phone] (office)   CC: Dr. Tasia Catchings Dr. Damita Dunnings

## 2018-07-18 NOTE — Anesthesia Postprocedure Evaluation (Signed)
Anesthesia Post Note  Patient: [name]  Procedure(s) Performed: ESOPHAGOGASTRODUODENOSCOPY (EGD) WITH PROPOFOL (N/A )  Patient location during evaluation: PACU Anesthesia Type: General Level of consciousness: awake and alert Pain management: pain level controlled Vital Signs Assessment: post-procedure vital signs reviewed and stable Respiratory status: spontaneous breathing, nonlabored ventilation, respiratory function stable and patient connected to nasal cannula oxygen Cardiovascular status: blood pressure returned to baseline and stable Postop Assessment: no apparent nausea or vomiting Anesthetic complications: no     Last Vitals:  Vitals:   07/15/18 1038 07/15/18 1048  BP: (!) 121/55 135/62  Pulse: 62 63  Resp: 15 14  Temp:    SpO2: 90% 92%    Last Pain:  Vitals:   07/16/18 0732  TempSrc:   PainSc: 0-No pain                 Molli Barrows

## 2018-07-21 ENCOUNTER — Ambulatory Visit
Admission: RE | Admit: 2018-07-21 | Discharge: 2018-07-21 | Disposition: A | Discharge status: Home / Self Care | Payer: Medicare Other | Source: Ambulatory Visit | Attending: Nurse Practitioner | Admitting: Nurse Practitioner

## 2018-07-21 ENCOUNTER — Other Ambulatory Visit: Payer: Self-pay

## 2018-07-21 DIAGNOSIS — C159 Malignant neoplasm of esophagus, unspecified: Secondary | ICD-10-CM | POA: Diagnosis not present

## 2018-07-21 DIAGNOSIS — R29818 Other symptoms and signs involving the nervous system: Secondary | ICD-10-CM | POA: Insufficient documentation

## 2018-07-21 MED ORDER — GADOBUTROL 1 MMOL/ML IV SOLN
9.0000 mL | Freq: Once | INTRAVENOUS | Status: AC | PRN
  Administered 2018-07-21: 9 mL via INTRAVENOUS

## 2018-07-22 LAB — CULTURE, BLOOD (ROUTINE X 2)
Culture: NO GROWTH
Culture: NO GROWTH
Special Requests: ADEQUATE

## 2018-07-23 ENCOUNTER — Encounter: Payer: Self-pay | Admitting: Nurse Practitioner

## 2018-07-23 ENCOUNTER — Telehealth: Payer: Self-pay | Admitting: Nurse Practitioner

## 2018-07-23 DIAGNOSIS — I952 Hypotension due to drugs: Secondary | ICD-10-CM | POA: Insufficient documentation

## 2018-07-23 MED ORDER — ASPIRIN EC 81 MG PO TBEC: 81.0000 mg | DELAYED_RELEASE_TABLET | Freq: Every day | ORAL | 3 refills | Status: DC

## 2018-07-23 NOTE — Telephone Encounter (Signed)
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  Per Dr. Tasia Catchings recommended starting aspirin 81 mg daily and following up with Dr. Damita Dunnings for continued discussion of results, implications, and consideration of risk reduction including lipid panel and consideration of statin.  Patient verbalizes understanding. Attempted to call her sister, Dub Mikes, at her request but no voicemail. Called son, Yvone Neu to discuss. Message left to return call.

## 2018-07-24 ENCOUNTER — Encounter: Payer: Self-pay | Admitting: Family Medicine

## 2018-07-24 ENCOUNTER — Other Ambulatory Visit: Payer: Self-pay

## 2018-07-24 ENCOUNTER — Telehealth: Payer: Self-pay | Admitting: Nurse Practitioner

## 2018-07-24 ENCOUNTER — Ambulatory Visit: Payer: Medicare Other | Admitting: Family Medicine

## 2018-07-24 VITALS — BP 110/58 | HR 84 | Temp 98.5°F | Ht 62.0 in | Wt 198.0 lb

## 2018-07-24 DIAGNOSIS — N3000 Acute cystitis without hematuria: Secondary | ICD-10-CM

## 2018-07-24 DIAGNOSIS — R300 Dysuria: Secondary | ICD-10-CM

## 2018-07-24 DIAGNOSIS — I10 Essential (primary) hypertension: Secondary | ICD-10-CM | POA: Diagnosis not present

## 2018-07-24 DIAGNOSIS — I639 Cerebral infarction, unspecified: Secondary | ICD-10-CM | POA: Diagnosis not present

## 2018-07-24 MED ORDER — ASPIRIN EC 81 MG PO TBEC: 81.0000 mg | DELAYED_RELEASE_TABLET | ORAL | Status: AC

## 2018-07-24 NOTE — Telephone Encounter (Signed)
Spoke to patient's son, Yvone Neu regarding results of MRI and recommendations. He will discuss again with his mother and help to facilitate visit to PCP. Also discussed role of [facility] at [facility].

## 2018-07-24 NOTE — Progress Notes (Signed)
She is a little lightheaded.  D/w pt.  2+ BLE edema, improved with lasix. She did worse off lasix.    Prev MRI done, along with IVF.  She wasn't taking a lot of fluids at baseline.  Possible stroke noted.  This was an incidental finding.  She had previously stopped ASA 81mg  to due to bruising and bleeding.  She has no history of atrial fibrillation.  She has a lower blood pressure noted.  She is not on a statin.  She has known cancer with liver metastasis.She has some B leg weakness since starting [place] and I don't want to potentially exacerbate that with statin, discussed.  She has had persistent dysuria previously.  tx'd with fosfomycin last week.    PMH and SH reviewed  ROS: Per HPI unless specifically indicated in ROS section   Meds, vitals, and allergies reviewed.   nad ncat OP wnl Neck supple, no stridor.  Voice is slightly hoarse.  rrr ctab abd soft, not tender to palpation.  Normal bowel sounds. 2+ BLE edema.

## 2018-07-24 NOTE — Patient Instructions (Addendum)
Try to increase protein with each meal.  Stop metoprolol for now.  Try taking aspirin every other day, if no bruising or bleeding.   Update me about how you feel next week.  We'll go from there.   We'll contact you with your lab report. Take care.  Glad to see you.

## 2018-07-26 ENCOUNTER — Other Ambulatory Visit: Payer: Self-pay | Admitting: Family Medicine

## 2018-07-26 ENCOUNTER — Encounter: Payer: Self-pay | Admitting: Family Medicine

## 2018-07-26 DIAGNOSIS — I639 Cerebral infarction, unspecified: Secondary | ICD-10-CM | POA: Insufficient documentation

## 2018-07-26 LAB — URINE CULTURE
MICRO NUMBER:: 317297
SPECIMEN QUALITY:: ADEQUATE

## 2018-07-26 MED ORDER — SULFAMETHOXAZOLE-TRIMETHOPRIM 400-80 MG PO TABS: 1.0000 | ORAL_TABLET | Freq: Two times a day (BID) | ORAL | 0 refills | Status: DC

## 2018-07-26 NOTE — Assessment & Plan Note (Signed)
Lesion noted on MRI concerning for previous stroke.  Reasonable to try aspirin every other day if she can tolerate that without significant bleeding or bruising.  I do not want to induce hypotension by adding on another blood pressure medication.  She already has a relatively low blood pressure.  Stop metoprolol in the meantime.  Defer statin start at this point.  I did not check her cholesterol because regardless of her cholesterol level, we would normally consider a statin anyway.  However, in her particular case given her liver metastases and other medical conditions it may make sense to defer statin at this point, regardless of her cholesterol levels.  Discussed with patient and family.  All in agreement.  >25 minutes spent in face to face time with patient, >50% spent in counselling or coordination of care.

## 2018-07-26 NOTE — Assessment & Plan Note (Signed)
History of.  Treated with fosfomycin last week.  Reasonable to recheck urine culture today.  Hold on no antibiotics at this point.  Repeating antibiotics at this point.  She does not have active dysuria.

## 2018-07-26 NOTE — Assessment & Plan Note (Addendum)
See above.  Stop metoprolol.  Continue Lasix for now.  Discussed adequate protein intake in her diet.

## 2018-07-29 ENCOUNTER — Inpatient Hospital Stay: Payer: Medicare Other

## 2018-07-29 ENCOUNTER — Other Ambulatory Visit: Payer: Self-pay

## 2018-07-29 ENCOUNTER — Inpatient Hospital Stay: Payer: Medicare Other | Admitting: Oncology

## 2018-07-29 ENCOUNTER — Encounter: Payer: Self-pay | Admitting: Oncology

## 2018-07-29 VITALS — BP 114/64 | HR 90 | Temp 98.4°F | Resp 18 | Wt 194.3 lb

## 2018-07-29 DIAGNOSIS — K5900 Constipation, unspecified: Secondary | ICD-10-CM

## 2018-07-29 DIAGNOSIS — Z5112 Encounter for antineoplastic immunotherapy: Secondary | ICD-10-CM

## 2018-07-29 DIAGNOSIS — Z923 Personal history of irradiation: Secondary | ICD-10-CM

## 2018-07-29 DIAGNOSIS — Z803 Family history of malignant neoplasm of breast: Secondary | ICD-10-CM

## 2018-07-29 DIAGNOSIS — G629 Polyneuropathy, unspecified: Secondary | ICD-10-CM

## 2018-07-29 DIAGNOSIS — C7951 Secondary malignant neoplasm of bone: Secondary | ICD-10-CM

## 2018-07-29 DIAGNOSIS — Z9884 Bariatric surgery status: Secondary | ICD-10-CM

## 2018-07-29 DIAGNOSIS — Z87891 Personal history of nicotine dependence: Secondary | ICD-10-CM

## 2018-07-29 DIAGNOSIS — C159 Malignant neoplasm of esophagus, unspecified: Secondary | ICD-10-CM

## 2018-07-29 DIAGNOSIS — R600 Localized edema: Secondary | ICD-10-CM

## 2018-07-29 DIAGNOSIS — N390 Urinary tract infection, site not specified: Secondary | ICD-10-CM | POA: Diagnosis not present

## 2018-07-29 DIAGNOSIS — J449 Chronic obstructive pulmonary disease, unspecified: Secondary | ICD-10-CM

## 2018-07-29 DIAGNOSIS — R740 Nonspecific elevation of levels of transaminase and lactic acid dehydrogenase [LDH]: Secondary | ICD-10-CM

## 2018-07-29 DIAGNOSIS — Z79899 Other long term (current) drug therapy: Secondary | ICD-10-CM

## 2018-07-29 DIAGNOSIS — I10 Essential (primary) hypertension: Secondary | ICD-10-CM

## 2018-07-29 DIAGNOSIS — C155 Malignant neoplasm of lower third of esophagus: Secondary | ICD-10-CM

## 2018-07-29 DIAGNOSIS — N3000 Acute cystitis without hematuria: Secondary | ICD-10-CM

## 2018-07-29 DIAGNOSIS — E46 Unspecified protein-calorie malnutrition: Secondary | ICD-10-CM

## 2018-07-29 DIAGNOSIS — Z9221 Personal history of antineoplastic chemotherapy: Secondary | ICD-10-CM

## 2018-07-29 DIAGNOSIS — Z7982 Long term (current) use of aspirin: Secondary | ICD-10-CM

## 2018-07-29 LAB — COMPREHENSIVE METABOLIC PANEL
ALK PHOS: 141 U/L — AB (ref 38–126)
ALT: 23 U/L (ref 0–44)
AST: 58 U/L — ABNORMAL HIGH (ref 15–41)
Albumin: 2.8 g/dL — ABNORMAL LOW (ref 3.5–5.0)
Anion gap: 9 (ref 5–15)
BUN: 15 mg/dL (ref 8–23)
CO2: 29 mmol/L (ref 22–32)
Calcium: 8.3 mg/dL — ABNORMAL LOW (ref 8.9–10.3)
Chloride: 99 mmol/L (ref 98–111)
Creatinine, Ser: 0.94 mg/dL (ref 0.44–1.00)
GFR calc Af Amer: >60 mL/min (ref >60)
GFR calc non Af Amer: 57 mL/min — ABNORMAL LOW (ref >60)
Glucose, Bld: 88 mg/dL (ref 70–99)
Potassium: 3.8 mmol/L (ref 3.5–5.1)
Sodium: 137 mmol/L (ref 135–145)
Total Bilirubin: 0.8 mg/dL (ref 0.3–1.2)
Total Protein: 7.2 g/dL (ref 6.5–8.1)

## 2018-07-29 LAB — CBC WITH DIFFERENTIAL/PLATELET
Abs Immature Granulocytes: 0.1 10*3/uL — ABNORMAL HIGH (ref 0.00–0.07)
Basophils Absolute: 0 10*3/uL (ref 0.0–0.1)
Basophils Relative: 0 %
Eosinophils Absolute: 0.3 10*3/uL (ref 0.0–0.5)
Eosinophils Relative: 3 %
HCT: 33.5 % — ABNORMAL LOW (ref 36.0–46.0)
Hemoglobin: 10.3 g/dL — ABNORMAL LOW (ref 12.0–15.0)
Immature Granulocytes: 1 %
Lymphocytes Relative: 9 %
Lymphs Abs: 0.8 10*3/uL (ref 0.7–4.0)
MCH: 29.3 pg (ref 26.0–34.0)
MCHC: 30.7 g/dL (ref 30.0–36.0)
MCV: 95.2 fL (ref 80.0–100.0)
Monocytes Absolute: 0.8 10*3/uL (ref 0.1–1.0)
Monocytes Relative: 9 %
Neutro Abs: 6.9 10*3/uL (ref 1.7–7.7)
Neutrophils Relative %: 78 %
Platelets: 309 10*3/uL (ref 150–400)
RBC: 3.52 MIL/uL — ABNORMAL LOW (ref 3.87–5.11)
RDW: 17.1 % — AB (ref 11.5–15.5)
WBC: 8.8 10*3/uL (ref 4.0–10.5)
nRBC: 0 % (ref 0.0–0.2)

## 2018-07-29 LAB — TSH: TSH: 2.177 u[IU]/mL (ref 0.350–4.500)

## 2018-07-29 MED ORDER — HEPARIN SOD (PORK) LOCK FLUSH 100 UNIT/ML IV SOLN
500.0000 [IU] | Freq: Once | INTRAVENOUS | Status: AC
  Administered 2018-07-29: 500 [IU] via INTRAVENOUS
  Filled 2018-07-29: qty 5

## 2018-07-29 MED ORDER — SODIUM CHLORIDE 0.9 % IV SOLN
Freq: Once | INTRAVENOUS | Status: AC
  Administered 2018-07-29: 10:00:00 via INTRAVENOUS
  Filled 2018-07-29: qty 250

## 2018-07-29 MED ORDER — HEPARIN SOD (PORK) LOCK FLUSH 100 UNIT/ML IV SOLN: 500.0000 [IU] | Freq: Once | INTRAVENOUS | Status: DC | PRN

## 2018-07-29 MED ORDER — SODIUM CHLORIDE 0.9% FLUSH
10.0000 mL | Freq: Once | INTRAVENOUS | Status: AC
  Administered 2018-07-29: 10 mL via INTRAVENOUS
  Filled 2018-07-29: qty 10

## 2018-07-29 MED ORDER — SODIUM CHLORIDE 0.9 % IV SOLN
200.0000 mg | Freq: Once | INTRAVENOUS | Status: AC
  Administered 2018-07-29: 200 mg via INTRAVENOUS
  Filled 2018-07-29: qty 8

## 2018-07-29 NOTE — Progress Notes (Signed)
Hematology/Oncology Follow Up Note [facility]  Telephone:([phone] [phone]  Patient Care Team: Tonia Ghent, MD as PCP - General (Family Medicine)   Name of the patient: [name]  [id number]  [birth year]   REASON FOR VISIT Follow up of chemotherapy management of esophageal adenocarcinoma,   HISTORY OF PRESENT ILLNESS Patient is a 29-old female who has metastatic esophageal cancer with liver involvement.  Weight loss 20 pounds for the past year prior to diagnosis.  # EGD during admission showed partially obstructive esophageal mass at the distal part of esophagus. Biopsy was taken. Pathology showed esophageal adenocarcinoma. #US biopsy of liver lesion to proof distant metastasis.  Report family history of breast cancer, but she has been having routine mammograms.  # Lives alone. She's a former smoker.  # Molecular Testing:  #[employer] Cdx: No reportable alternations with companion diagnostic (CDx) claims.  MS stable Tumor mutation burden: Cannot be determined, CCND3 amplification: Equivocal.  Amended reports on 06/04/2017, Tumor mutational burden changed from "can not be determined" to "TMB -low" on the re-analyzed samples.  Tumor Proportion Score [TPS] 20%. # HER 2 negative.   Antineoplasm Treatments S/p 6 cycles of FOLFOX, PET scan showed excellent partial response from both chemotherapy and radiation, result was discussed with patient.  Patient was switched to 5-FU maintenance and to September 2019. S/p palliative radiation in January 2019 # April 2019 Developed esophageal stricture secondary to radiation in April and got dilation via endoscopy. EGD shoed Benign-appearing esophageal stenosis. This is a result of radiation therapy.- LA Grade C radiation esophagitis. - The previously noted esophageal mass in Nov 2018 was much decreased indicating good response to radiation/chemotherapy. - Normal stomach.- Normal examined jejunum.- No  specimens collected.   #01/23/2018 PET scan showed interval development of loculated ascites within the upper abdomen with overlies the liver and the spleen and exhibited increased radio tracer uptake.  Worrisome for peritoneal carcinomatosis.  Soft tissue infiltration within the omentum with increased radiotracer uptake noted.  Worrisome for peritoneal carcinomatosis.  New hypermetabolic internal mammary lymph nodes and a right CP angle lymph node.  Suspicious for metastatic adenopathy.  Mild FDG uptake is identified localizing to the lower third of the esophagus raising SUV max of 5.15.  01/26/2018 patient was switched to Taxol and Cyramza treatments.  INTERVAL HISTORY  Patient with above history presents for assessment prior to second line chemotherapy for treatment of metastatic esophageal cancer.  She is accompanied by her sister.  Patient is currently on antibiotics for recurrent UTI.  Finishing antibiotics tomorrow. Also recent MRI of brain showed a subacute stroke. Patient was advised to restart on aspirin 81 mg.  And to follow-up with primary care physician. Patient has been seen by PCP and had a discussion about subacute stroke.  Patient stopped using aspirin because of increased bruising.  Decision was made to take aspirin 81 mg every other day.  Statin was not started given patient's metastatic esophageal cancer setting and a limited life span.  Metoprolol was discontinued due to low blood pressure.  Today patient reports cough/congestion.  Cough is productive with phlegm the past 2 weeks.  Patient is on Bactrim for UTI this week and she also feels her cough has been improving as well.  Lower extremity swelling and edema.  On Lasix 20 to 40 mg as needed for edema. She reports her abdominal pain is well controlled with taking Tylenol as needed.  She has not been using any narcotics for pain.  Review of Systems  Constitutional: Negative for chills, fatigue, fever and unexpected weight  change.  HENT:   Negative for hearing loss and voice change.   Eyes: Negative for eye problems.  Respiratory: Negative for chest tightness and cough.   Cardiovascular: Positive for leg swelling. Negative for chest pain.  Gastrointestinal: Negative for abdominal distention, abdominal pain and blood in stool.  Endocrine: Negative for hot flashes.  Genitourinary: Negative for difficulty urinating, dysuria and frequency.   Musculoskeletal: Negative for arthralgias and flank pain.  Skin: Negative for itching and rash.  Neurological: Positive for numbness. Negative for extremity weakness.  Hematological: Negative for adenopathy.  Psychiatric/Behavioral: Negative for confusion.     Allergies  Allergen Reactions  . Cefuroxime Nausea Only  . Codeine Nausea Only  . Doxycycline Other (See Comments)    Lip swelling  . Gabapentin Other (See Comments)    Swelling.   . Iohexol Itching    07-15-13 pt developed itching on fingers after contrast given. Dr. Irish Elders looked at pt and said to put in system as allergy. BB  . Other Other (See Comments)    Contrast Dye- caused fever, chills, awful feeling Bandages - itching, rash  . Oxycodone Other (See Comments)    nausea  . Quinolones Swelling    Lip swelling  . Synvisc [Hylan G-F 20] Swelling     Past Medical History:  Diagnosis Date  . Arthritis   . Asthma   . COPD (chronic obstructive pulmonary disease) ([place])   . Dysphonia   . Dyspnea   . Esophageal cancer ([place])   . GERD (gastroesophageal reflux disease)   . Heart murmur   . History of kidney stones   . Hypertension   . Hypokalemia 10/07/2017  . Melanoma ([place])   . OAB (overactive bladder)   . OSA on CPAP   . 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 Personal history of chemotherapy    esophageal cancer  . Personal history of radiation therapy    esophageal cancer  . Pupil asymmetry    R pupil defect  . Skin cancer   . Sleep apnea   . Spinal cord abscess      Past  Surgical History:  Procedure Laterality Date  . ABDOMINAL HYSTERECTOMY    . ANTERIOR CERVICAL DECOMP/DISCECTOMY FUSION N/A 07/16/2013   Procedure: ANTERIOR CERVICAL DECOMPRESSION/DISCECTOMY FUSION mutlipleLEVELS C4-7;  Surgeon: Erline Levine, MD;  Location: [facility] NEURO ORS;  Service: Neurosurgery;  Laterality: N/A;  . APPENDECTOMY    . carpel tunn Bilateral   . CATARACT EXTRACTION    . CATARACT EXTRACTION, BILATERAL    . CHOLECYSTECTOMY    . dental implant    . ESOPHAGOGASTRODUODENOSCOPY Left 03/21/2017   Procedure: ESOPHAGOGASTRODUODENOSCOPY (EGD);  Surgeon: Virgel Manifold, MD;  Location: [facility] ENDOSCOPY;  Service: Endoscopy;  Laterality: Left;  . ESOPHAGOGASTRODUODENOSCOPY (EGD) WITH PROPOFOL N/A 08/20/2017   Procedure: ESOPHAGOGASTRODUODENOSCOPY (EGD) WITH PROPOFOL;  Surgeon: Virgel Manifold, MD;  Location: [facility] ENDOSCOPY;  Service: Endoscopy;  Laterality: N/A;  . ESOPHAGOGASTRODUODENOSCOPY (EGD) WITH PROPOFOL N/A 09/19/2017   Procedure: ESOPHAGOGASTRODUODENOSCOPY (EGD) WITH PROPOFOL;  Surgeon: Virgel Manifold, MD;  Location: [facility] ENDOSCOPY;  Service: Endoscopy;  Laterality: N/A;  . ESOPHAGOGASTRODUODENOSCOPY (EGD) WITH PROPOFOL N/A 10/03/2017   Procedure: ESOPHAGOGASTRODUODENOSCOPY (EGD) WITH PROPOFOL;  Surgeon: Virgel Manifold, MD;  Location: [facility] ENDOSCOPY;  Service: Endoscopy;  Laterality: N/A;  . ESOPHAGOGASTRODUODENOSCOPY (EGD) WITH PROPOFOL N/A 10/22/2017   Procedure: ESOPHAGOGASTRODUODENOSCOPY (EGD) WITH PROPOFOL;  Surgeon: [name]  B, MD;  Location: [facility] ENDOSCOPY;  Service: Endoscopy;  Laterality: N/A;  . ESOPHAGOGASTRODUODENOSCOPY (EGD) WITH PROPOFOL N/A 11/18/2017   Procedure: ESOPHAGOGASTRODUODENOSCOPY (EGD) WITH PROPOFOL;  Surgeon: Virgel Manifold, MD;  Location: [facility] ENDOSCOPY;  Service: Endoscopy;  Laterality: N/A;  . ESOPHAGOGASTRODUODENOSCOPY (EGD) WITH PROPOFOL N/A 12/17/2017   Procedure: ESOPHAGOGASTRODUODENOSCOPY (EGD) WITH PROPOFOL;  Surgeon:  Virgel Manifold, MD;  Location: [facility] ENDOSCOPY;  Service: Endoscopy;  Laterality: N/A;  . ESOPHAGOGASTRODUODENOSCOPY (EGD) WITH PROPOFOL N/A 01/08/2018   Procedure: ESOPHAGOGASTRODUODENOSCOPY (EGD) WITH PROPOFOL;  Surgeon: Virgel Manifold, MD;  Location: [facility] ENDOSCOPY;  Service: Endoscopy;  Laterality: N/A;  . ESOPHAGOGASTRODUODENOSCOPY (EGD) WITH PROPOFOL N/A 01/22/2018   Procedure: ESOPHAGOGASTRODUODENOSCOPY (EGD) WITH PROPOFOL;  Surgeon: Virgel Manifold, MD;  Location: [facility] ENDOSCOPY;  Service: Endoscopy;  Laterality: N/A;  . ESOPHAGOGASTRODUODENOSCOPY (EGD) WITH PROPOFOL N/A 07/15/2018   Procedure: ESOPHAGOGASTRODUODENOSCOPY (EGD) WITH PROPOFOL;  Surgeon: Virgel Manifold, MD;  Location: [facility] ENDOSCOPY;  Service: Endoscopy;  Laterality: N/A;  . EYE SURGERY    . GASTRIC BYPASS    . HERNIA REPAIR    . HIATAL HERNIA REPAIR    . JOINT REPLACEMENT Bilateral    knee  . MELANOMA EXCISION    . PORTA CATH INSERTION N/A 04/07/2017   Procedure: PORTA CATH INSERTION;  Surgeon: Algernon Huxley, MD;  Location: [place] [facility] CV LAB;  Service: Cardiovascular;  Laterality: N/A;  . POSTERIOR CERVICAL FUSION/FORAMINOTOMY N/A 07/28/2013   Procedure: Cervical four-seven  posterior cervical fusion;  Surgeon: Erline Levine, MD;  Location: [place] NEURO ORS;  Service: Neurosurgery;  Laterality: N/A;  . TONSILLECTOMY    . TOTAL KNEE ARTHROPLASTY      Social History   Socioeconomic History  . Marital status: Widowed    Spouse name: Not on file  . Number of children: 2  . Years of education: Not on file  . Highest education level: Master's degree (e.g., MA, MS, MEng, MEd, MSW, MBA)  Occupational History  . Not on file  Social Needs  . Financial resource strain: Not hard at all  . Food insecurity:    Worry: Never true    Inability: Never true  . Transportation needs:    Medical: No    Non-medical: No  Tobacco Use  . Smoking status: Former Smoker    Packs/day: 1.00    Years: 20.00    Pack  years: 20.00    Types: Cigarettes    Last attempt to quit: 1977    Years since quitting: 43.2  . Smokeless tobacco: Never Used  Substance and Sexual Activity  . Alcohol use: No  . Drug use: Never  . Sexual activity: Not Currently  Lifestyle  . Physical activity:    Days per week: 2 days    Minutes per session: 30 min  . Stress: Not at all  Relationships  . Social connections:    Talks on phone: More than three times a week    Gets together: More than three times a week    Attends religious service: More than 4 times per year    Active member of club or organization: Yes    Attends meetings of clubs or organizations: More than 4 times per year    Relationship status: Widowed  . Intimate partner violence:    Fear of current or ex partner: No    Emotionally abused: No    Physically abused: No    Forced sexual activity: No  Other Topics Concern  . Not on file  Social History Narrative   Marital Status- Widowed    Lives by herself    Employement- Retired Pharmacist, hospital   Exercise hx- Does PT     Family History  Problem Relation Age of Onset  . Breast cancer Mother 73  . Arthritis-Osteo Mother   . Breast cancer Sister 63  . Bladder Cancer Sister   . Bladder Cancer Father   . Tongue cancer Father   . Congenital heart disease Father   . Prostate cancer Brother   . Uterine cancer Maternal Aunt   . Lung cancer Paternal Uncle   . Leukemia Maternal Grandmother   . Liver cancer Paternal Grandmother   . Colon cancer Neg Hx      Current Outpatient Medications:  .  albuterol (PROVENTIL HFA;VENTOLIN HFA) 108 (90 Base) MCG/ACT inhaler, Inhale 2 puffs into the lungs every 4 (four) hours as needed for wheezing or shortness of breath., Disp: , Rfl:  .  aspirin EC 81 MG tablet, Take 1 tablet (81 mg total) by mouth every other day., Disp: , Rfl:  .  calcium carbonate (TUMS EX) 750 MG chewable tablet, Chew 1 tablet by mouth as needed for heartburn. , Disp: , Rfl:  .  chlorhexidine  (PERIDEX) 0.12 % solution, Use as directed 15 mLs in the mouth or throat 2 (two) times daily., Disp: , Rfl:  .  Cholecalciferol (VITAMIN D) 2000 UNITS CAPS, Take 2,000 Units by mouth daily., Disp: , Rfl:  .  cyanocobalamin 500 MCG tablet, Take 500 mcg every other day by mouth., Disp: , Rfl:  .  Diclofenac Sodium 1 % CREA, Apply to left arm as needed for pain three times daily, Disp: 120 g, Rfl: 0 .  dronabinol (MARINOL) 5 MG capsule, Take 1 capsule (5 mg total) by mouth 2 (two) times daily before a meal., Disp: 60 capsule, Rfl: 0 .  fluticasone (FLONASE) 50 MCG/ACT nasal spray, Place 1-2 sprays into both nostrils daily., Disp: 16 g, Rfl: 5 .  furosemide (LASIX) 20 MG tablet, TAKE 1-2 TABLETS BY MOUTH DAILY AS NEEDED FOR EDEMA, Disp: 180 tablet, Rfl: 1 .  hydrocortisone cream 1 %, Apply 1 application topically daily as needed for itching. , Disp: , Rfl:  .  loratadine (CLARITIN) 10 MG tablet, Take 10 mg by mouth daily., Disp: , Rfl:  .  Multiple Vitamins-Minerals (MULTIVITAMIN ADULT) CHEW, Chew 1 tablet by mouth daily. , Disp: , Rfl:  .  MYRBETRIQ 25 MG TB24 tablet, TAKE ONE TABLET EVERY DAY, Disp: 30 tablet, Rfl: 2 .  ondansetron (ZOFRAN) 8 MG tablet, Take 1 tablet (8 mg total) by mouth 2 (two) times daily as needed (Nausea or vomiting)., Disp: 30 tablet, Rfl: 3 .  pantoprazole (PROTONIX) 40 MG tablet, TAKE 1 TABLET BY MOUTH TWICE DAILY BEFORE A MEAL, Disp: 60 tablet, Rfl: 3 .  Polyethyl Glycol-Propyl Glycol (SYSTANE OP), Apply 1 drop to eye daily as needed (dry eyes)., Disp: , Rfl:  .  potassium chloride (KLOR-CON) 20 MEQ packet, Take 20 mEq by mouth daily., Disp: 30 packet, Rfl: 1 .  pregabalin (LYRICA) 150 MG capsule, Take 1 capsule (150 mg total) by mouth 2 (two) times daily., Disp: , Rfl:  .  prochlorperazine (COMPAZINE) 10 MG tablet, Take 1 tablet (10 mg total) by mouth every 6 (six) hours as needed (Nausea or vomiting)., Disp: 30 tablet, Rfl: 1 .  pyridOXINE (VITAMIN B-6) 100 MG tablet, Take  1 tablet (100 mg total) by mouth daily., Disp: 30 tablet, Rfl: 3 .  SENNA-PLUS 8.6-50 MG tablet, TAKE TWO TABLETS BY MOUTH EVERY DAY, Disp: 60 tablet, Rfl: 0 .  sulfamethoxazole-trimethoprim (BACTRIM) 400-80 MG tablet, Take 1 tablet by mouth 2 (two) times daily., Disp: 10 tablet, Rfl: 0 .  traMADol (ULTRAM) 50 MG tablet, Take 1 tablet (50 mg total) by mouth every 6 (six) hours as needed., Disp: 60 tablet, Rfl: 0 .  traZODone (DESYREL) 50 MG tablet, Take 0.5-1 tablets (25-50 mg total) by mouth at bedtime as needed for sleep., Disp: , Rfl:  .  MONUROL 3 g PACK, , Disp: , Rfl:  No current facility-administered medications for this visit.   Facility-Administered Medications Ordered in Other Visits:  .  heparin lock flush 100 unit/mL, 500 Units, Intravenous, Once, Earlie Server, MD .  heparin lock flush 100 unit/mL, 500 Units, Intracatheter, Once PRN, Earlie Server, MD .  sodium chloride flush (NS) 0.9 % injection 10 mL, 10 mL, Intravenous, PRN, Earlie Server, MD, 10 mL at 01/26/18 0904  Physical exam:  Vitals:   07/29/18 0857  BP: 114/64  Pulse: 90  Resp: 18  Temp: 98.4 F (36.9 C)  TempSrc: Tympanic  SpO2: 93%  Weight: 194 lb 4.8 oz (88.1 kg)  ECOG 2 Physical Exam  Constitutional: She is oriented to person, place, and time. No distress.  HENT:  Head: Normocephalic and atraumatic.  Nose: Nose normal.  Mouth/Throat: Oropharynx is clear and moist. No oropharyngeal exudate.  Eyes: Pupils are equal, round, and reactive to light. Conjunctivae and EOM are normal. No scleral icterus.  Neck: Normal range of motion. Neck supple. No JVD present.  Cardiovascular: Normal rate and regular rhythm.  Murmur heard. Pulmonary/Chest: Effort normal and breath sounds normal. No respiratory distress. She has no wheezes. She has no rales. She exhibits no tenderness.  Abdominal: Soft. Bowel sounds are normal. She exhibits no distension. There is no abdominal tenderness.  Musculoskeletal: Normal range of motion.         General: Edema present. No tenderness or deformity.  Lymphadenopathy:    She has no cervical adenopathy.  Neurological: She is alert and oriented to person, place, and time. No cranial nerve deficit. She exhibits normal muscle tone. Coordination normal.  Skin: Skin is warm and dry. She is not diaphoretic. No erythema.  Psychiatric: Affect and judgment normal.    CMP Latest Ref Rng & Units 07/29/2018  Glucose 70 - 99 mg/dL 88  BUN 8 - 23 mg/dL 15  Creatinine 0.44 - 1.00 mg/dL 0.94  Sodium 135 - 145 mmol/L 137  Potassium 3.5 - 5.1 mmol/L 3.8  Chloride 98 - 111 mmol/L 99  CO2 22 - 32 mmol/L 29  Calcium 8.9 - 10.3 mg/dL 8.3(L)  Total Protein 6.5 - 8.1 g/dL 7.2  Total Bilirubin 0.3 - 1.2 mg/dL 0.8  Alkaline Phos 38 - 126 U/L 141(H)  AST 15 - 41 U/L 58(H)  ALT 0 - 44 U/L 23   CBC Latest Ref Rng & Units 07/29/2018  WBC 4.0 - 10.5 K/uL 8.8  Hemoglobin 12.0 - 15.0 g/dL 10.3(L)  Hematocrit 36.0 - 46.0 % 33.5(L)  Platelets 150 - 400 K/uL 309   RADIOGRAPHIC STUDIES: I have personally reviewed the radiological images as listed and agreed with the findings in the report.  PET scan 01/23/2018  Interval development of loculated ascites within the upper abdomen which overlies the liver and spleen and exhibits increased radiotracer uptake. Findings worrisome for peritoneal carcinomatosis. Consider further evaluation with diagnostic paracentesis. 2. Soft tissue infiltration within the omentum with increased  radiotracer uptake noted. Also worrisome for peritoneal carcinomatosis. 3. New hypermetabolic internal mammary lymph nodes and right CP angle lymph node. Suspicious for metastatic adenopathy. 4. Mild FDG uptake is identified localizing to the lower third of the esophagus within SUV max of 5.15.  Bone Scan 05/08/2018  1. Several small foci of activity in anterior ribs as noted above, and metastatic involvement can not be excluded. 2. Somewhat mottled activity within the midthoracic and upper  lumbar spine and metastatic involvement can not be excluded. Correlate with plain films or MRI preferably.  Assessment and plan  Cancer Staging Malignant neoplasm of lower third of esophagus [facility]) Staging form: Esophagus - Adenocarcinoma, AJCC 8th Edition - Clinical stage from 04/10/2017: Stage IVB (cTX, cNX, pM1) - Signed by Earlie Server, MD on 04/10/2017  1. Esophageal adenocarcinoma ([place])   2. Encounter for antineoplastic immunotherapy   3. Bone metastasis ([place])   4. Acute cystitis without hematuria   5. Bilateral lower extremity edema   6. Protein-calorie malnutrition, unspecified severity ([place])    #Esophageal cancer, metastatic Currently on third line treatment with immunotherapy Keytruda. TPS 20% She tolerated treatment well.  Labs are reviewed and discussed with patient. Proceed with cycle 4 Keytruda today. We will obtain CT chest abdomen pelvis prior to next cycle of Keytruda.  #Recurrent UTI, finish oral antibiotics course. #Depression/insomnia, continue trazodone 50 mg nightly. #Bone metastasis, discussed with patient about obtaining dental clearance for Zometa use. #Constipation, continue Senokot 2 tabs daily. #Transaminitis, slight elevated AST at 58.  Slightly increased since last visit.  Continue to monitor.  # Leg swelling bilateral.US venous negative for DVT. Advise low salt diet continue lasix as needed for lower extremity edema. No extremity swelling possibly due to general anasarca.   # Weight loss/ Protein calorie malnutrition.  Albumin 2.8.  Refer to dietitian   Follow-up in 3 weeks.  Orders Placed This Encounter  Procedures  . CT Abdomen Pelvis W Contrast    Standing Status:   Future    Standing Expiration Date:   07/29/2019    Order Specific Question:   ** REASON FOR EXAM (FREE TEXT)    Answer:   esophageal cancer    Order Specific Question:   If indicated for the ordered procedure, I authorize the administration of contrast media per Radiology protocol     Answer:   Yes    Order Specific Question:   Preferred imaging location?    Answer:   [facility]    Order Specific Question:   Is Oral Contrast requested for this exam?    Answer:   Yes, Per Radiology protocol    Order Specific Question:   Radiology Contrast Protocol - do NOT remove file path    Answer:   \\[email or web address]  . CT Chest W Contrast    Standing Status:   Future    Standing Expiration Date:   07/29/2019    Order Specific Question:   ** REASON FOR EXAM (FREE TEXT)    Answer:   Esophageal cancer    Order Specific Question:   If indicated for the ordered procedure, I authorize the administration of contrast media per Radiology protocol    Answer:   Yes    Order Specific Question:   Preferred imaging location?    Answer:   [facility]    Order Specific Question:   Radiology Contrast Protocol - do NOT remove file path    Answer:   \\[email or web address]  . Ambulatory Referral to Palliative Care  Referral Priority:   Routine    Referral Type:   Consultation    Referred to Provider:   Borders, Kirt Boys, NP    Number of Visits Requested:   1    Earlie Server, MD, PhD Hematology [place] [facility] Pager- [phone] 07/29/18

## 2018-07-29 NOTE — Progress Notes (Signed)
Patient here for follow up. Pt has had cough/congestion with Phelgm for the past 2 weeks, she states it is getting. Pt started on antibiotic (bactrium) for UTI this week. Dr. Damita Dunnings removed metoprolol from medications.

## 2018-07-30 LAB — CEA: CEA: 2.4 ng/mL (ref 0.0–4.7)

## 2018-07-31 ENCOUNTER — Ambulatory Visit: Payer: Self-pay

## 2018-07-31 ENCOUNTER — Ambulatory Visit: Payer: Medicare Other | Admitting: Internal Medicine

## 2018-07-31 NOTE — Telephone Encounter (Signed)
Pt called to say the she has has a cough for at least 2 weeks.  She states she has coughed so much that her abdomin is sore.  She is coughing up blood tinged mucus. She states phlegm is a brown color.  She has fever of 100 now.  She has a sore throat. She has not traveled. She is just finishing an Rx bactrum for bladder infection. Appointment was scheduled. Care advice read to patient. Pt verbalized understanding. Per Rena Patient call was transferred to office.  Reason for Disposition . Wheezing is present  Answer Assessment - Initial Assessment Questions 1. ONSET: "When did the cough begin?"      2 weeks ago 2. SEVERITY: "How bad is the cough today?"      severe 3. RESPIRATORY DISTRESS: "Describe your breathing."      Wheezing SOB 4. FEVER: "Do you have a fever?" If so, ask: "What is your temperature, how was it measured, and when did it start?"     100 a few minutes ago 5. SPUTUM: "Describe the color of your sputum" (clear, white, yellow, green)     Bloodybrown little balls 6. HEMOPTYSIS: "Are you coughing up any blood?" If so ask: "How much?" (flecks, streaks, tablespoons, etc.)     flakes 7. CARDIAC HISTORY: "Do you have any history of heart disease?" (e.g., heart attack, congestive heart failure)      no 8. LUNG HISTORY: "Do you have any history of lung disease?"  (e.g., pulmonary embolus, asthma, emphysema)     Cpap 9. PE RISK FACTORS: "Do you have a history of blood clots?" (or: recent major surgery, recent prolonged travel, bedridden)    No 10. OTHER SYMPTOMS: "Do you have any other symptoms?" (e.g., runny nose, wheezing, chest pain)      Sore throat Soreness from cough 11. PREGNANCY: "Is there any chance you are pregnant?" "When was your last menstrual period?"       N/A 12. TRAVEL: "Have you traveled out of the country in the last month?" (e.g., travel history, exposures)       No  Protocols used: [place]

## 2018-07-31 NOTE — Telephone Encounter (Signed)
Fransisca Connors transferred pt to me. I spoke with pt; for 2 wks or more has had what pt thought was terrible allergies. On 07/30/18 pt had prod cough with pinkish red blood clots in brown phlegm. Today pt has temp of 100.Pt is wheezing and SOB and when pt coughed on phone had deep wet sounding cough.when blows nose has yellow brown mucus. Pt has done no traveling and no known exposure to corona virus or flu. Also pt was seen 07/24/18  And pt still not voiding a lot of urine but also voiding q 30 mins.pt finished bactrim  today. Avie Echevaria NP said pt needed to go to ED for eval. Pt said she had bad experience in ED before but would go to [facility] walk in. Pt will go to [facility] walkin now. FYI to Avie Echevaria NP.

## 2018-08-02 ENCOUNTER — Other Ambulatory Visit: Payer: Self-pay

## 2018-08-02 NOTE — Telephone Encounter (Signed)
Please get update on patient Monday.  Thanks.

## 2018-08-03 ENCOUNTER — Inpatient Hospital Stay: Payer: Medicare Other

## 2018-08-03 ENCOUNTER — Telehealth: Payer: Self-pay

## 2018-08-03 NOTE — Telephone Encounter (Signed)
Patient went to [facility] and was given medication and feels much better now.

## 2018-08-03 NOTE — Telephone Encounter (Signed)
Noted. Thanks. I'll defer.  

## 2018-08-03 NOTE — Telephone Encounter (Signed)
Nutrition Follow-up:  Patient with metastatic esophageal adenocarcinoma currently on 3rd line treatment of keytruda.    Last seen by RD on 03/09/2018.  RD has had multiple visits with patient in the past.   Called patient today vs having inpatient clinic visit due to COVID-19 pandemic.  Patient did not have any other visit in cancer center today and did not want to patient patient at increased risk.    Patient reports that she has taste alterations and she does not know what to eat.  Reports that she is able to swallow all consistency of foods.  Reports that she is taking her appetite stimulant.  Noted cough and congestion over the phone which she has gotten treatment for per patient.  Reports reports that she has been eating soups. Does not like oral nutrition supplements anymore as too sweet.  Likes the taste of lemonade.   Medications: reviewed  Labs: reviewed  Anthropometrics:   Weight 194 lb noted on 07/28/2017 and 204 lb  11.2 oz on 03/09/2018.  5% weight loss in the last 5 months   NUTRITION DIAGNOSIS: Inadequate oral intake continues   INTERVENTION:  Reviewed strategies to help with taste changes.  Will email handout.       MONITORING, EVALUATION, GOAL: weight trends, intake   NEXT VISIT: phone f/u in 1 month  Laycee Fitzsimmons B. Zenia Resides, [place], [place] Registered Dietitian [phone] (pager)

## 2018-08-04 ENCOUNTER — Other Ambulatory Visit: Payer: Self-pay | Admitting: Family Medicine

## 2018-08-04 ENCOUNTER — Other Ambulatory Visit: Payer: Self-pay | Admitting: Oncology

## 2018-08-04 MED ORDER — PREGABALIN 150 MG PO CAPS: 150.0000 mg | ORAL_CAPSULE | Freq: Two times a day (BID) | ORAL | 0 refills | Status: DC

## 2018-08-04 NOTE — Telephone Encounter (Signed)
I would try using albuterol specifically for the cough.  See if that helps.  If not, then let me know.  Thanks.

## 2018-08-04 NOTE — Telephone Encounter (Signed)
Patient called to get her Lyrica refilled.  Patient said her Neurologist usually refills it, but because she hasn't seen him in over a year he won't refill the medication.  Patient said she has 7 pills left and that's 3 1/2 days worth.  Patient said her pharmacy told her to try and request the medication from [place].  Patient uses [facility].

## 2018-08-04 NOTE — Telephone Encounter (Signed)
Sent. Thanks.  But I would like her to contact the neurology clinc and see what options they have about follow up- evisits, etc.  Thanks.

## 2018-08-04 NOTE — Telephone Encounter (Signed)
Patient advised.

## 2018-08-04 NOTE — Telephone Encounter (Signed)
Patient advised and understands 

## 2018-08-04 NOTE — Telephone Encounter (Signed)
Pt left v/m; pt was given Azithromycin, benzonatate, mucinex and albuterol inhaler when see at [facility] on 07/31/18.Marland Kitchen Pt is feeling a lot better except for cough. Pt still has streaks of pinkish red in phlegm. Pt is worn out from coughing; benzonatate is not really helping cough. Pt is able to sleep during the night but during the day the prod cough with brown, blood tinged phlegm is most of the day. No fever to pts knowledge and pt checked temp while on phone; temp now is 99.6 (pt just drank some ice tea) and pt has taken the last abx last night. Please advise. [facility].

## 2018-08-07 ENCOUNTER — Telehealth: Payer: Self-pay

## 2018-08-07 NOTE — Telephone Encounter (Signed)
Telephone call to check on patient.  Patient reports she is feeling better.  Patient reports she has seen Dr. Damita Dunnings, Dr. Duanne Moron and provider Zenia Resides.  Patient reports she has been receiving "good care."  Patient states she does have a smart phone but does not feel comfortable with using phone other than making calls.  Patient requests palliative visit be scheduled at a later date.  Patient verbalizes appreciation for call.  Patient informed to call with questions or concerns.

## 2018-08-12 ENCOUNTER — Telehealth: Payer: Self-pay

## 2018-08-12 ENCOUNTER — Other Ambulatory Visit: Payer: Self-pay

## 2018-08-12 ENCOUNTER — Other Ambulatory Visit: Payer: Medicare Other | Admitting: Student

## 2018-08-12 DIAGNOSIS — Z515 Encounter for palliative care: Secondary | ICD-10-CM

## 2018-08-12 NOTE — Telephone Encounter (Signed)
Patient contacted for Palliative Care visit.  Due to the current COVID-19 infection/crises, the patient and family prefer, and have given their verbal consent for, a provider visit via telemedicine. HIPPA policies of confidentially were discussed and patient/family expressed understanding. Scheduled for 08/12/2018 @ 2pm

## 2018-08-12 NOTE — Progress Notes (Signed)
[facility] Consult Note Telephone: [phone]  Fax: [phone]  PATIENT NAME: [name] DOB: [birth year] MRN: [id number]  PRIMARY CARE PROVIDER:   Tonia Ghent, MD  REFERRING PROVIDER:  Tonia Ghent, MD [place], [place] [address]  RESPONSIBLE PARTY:  Self   ASSESSMENT: Due to the COVID-19 crisis, this visit was done via telemedicine from my office and it was initiated and consent by this patient and or family. Ms. Incorvaia also gives verbal consent to Palliative Medicine services. Ms. Dower is alert and oriented x 3; some forgetfulness and difficulty with word finding noted during visit. NP explained role of Palliative Medicine. Education provided on Palliative vs. Hospice as Ms. Tripoli expressed "palliative meaning the end." We discussed goals of care; she plans to continue her Keytruda. We also discussed symptom management. We discussed her pain, dyspnea, appetite and edema. Education provided on taking the furosemide 20mg  1-2 tablets as ordered. We discussed code status; she states she does have Advanced Directives, but needs to locate them. She is unsure of what her directives state and also expresses not being sure if she would want CPR. This will be an ongoing discussion.     RECOMMENDATIONS and PLAN:  1. Code status: Full Code; this will be an ongoing discussion and will readdress next visit. She is unsure if she wants CPR. 2. Medical goals of therapy: Ms. Ury will continue Keytruda; she is to follow up with Oncologist as scheduled. 3. Symptom management: Pain-continue acetaminophen prn, start taking tramadol 50mg  every 6 hours prn. Appetite-continue marinol twice daily before meals; follow up with registered dietician as scheduled. Dyspnea-continue albuterol 2 puffs every 4 hours prn, edema-continue furosemide 20mg  1-2 tablets daily prn; elevate legs while sitting in  chair or bed. Urinary symptoms-Dr. Damita Dunnings to be notified; she is encouraged to drink water, limit caffeine.  4. Discharge Planning: Ms. Munley will continue to reside at home.  5. Emotional support: Discussed with Ms. Canby.  She is encouraged to call with questions.   Palliative Medicine will follow up in 4 weeks or sooner, if needed.  I spent 45 minutes providing this consultation,  from 1:00pm to 1: 45pm. More than 50% of the time in this consultation was spent coordinating communication.   HISTORY OF PRESENT ILLNESS:  [name] is a 84 y.o. female with multiple medical problems including arthritis, asthma, esophageal adenocarcinoma, bone metastasis, protein calorie malnutrition, COPD, CVA, edema, gerd, hypertension, overactive bladder, OSA . Palliative Care was asked to help address goals of care. Ms. Kosa currently resides at home. She states she is able to complete adl's, ambulates with a cane when going out of the home. She denies weakness or any recent falls. She states her son will help with chores around the house. He also picks up take out food for her; she is able to prepare small meals. She states her appetite is still not good, but is attempting to eat foods that she enjoys. She reports pain to lower back and shoulders; she has been taking acetaminophen prn. She has tramadol on hand but states she has not been taking. She reports dyspnea with exertion, occasionally at rest. Her cough is much better; completed antibiotics for bronchitis. She denies nausea, constipation or diarrhea. She reports edema to lower extremities; she states she has been taking 3 furosemide tablets and felt this would help improve the edema. She does report burning with urination x  3 days; she recently completed antibiotics for urinary tract infection. She states she does not drink as much water as she should. She does drink 1 coke a day, tea. She denies dysuria, fever or any other urinary complaints.  No sleep difficulty reported. She states she is still drives some; her sister has been taking her to appointments. She denies equipment needs. She states she does have Advanced Directives but is unable to locate them.  CODE STATUS: Full Code   PPS: 70% HOSPICE ELIGIBILITY/DIAGNOSIS: TBD  PAST MEDICAL HISTORY:  Past Medical History:  Diagnosis Date   Arthritis    Asthma    COPD (chronic obstructive pulmonary disease) (HCC)    Dysphonia    Dyspnea    Esophageal cancer (HCC)    GERD (gastroesophageal reflux disease)    Heart murmur    History of kidney stones    Hypertension    Hypokalemia 10/07/2017   Melanoma (HCC)    OAB (overactive bladder)    OSA on CPAP    Paresthesia    from neck down after spinal abscess   Personal history of chemotherapy    esophageal cancer   Personal history of radiation therapy    esophageal cancer   Pupil asymmetry    R pupil defect   Skin cancer    Sleep apnea    Spinal cord abscess     SOCIAL HX:  Social History   Tobacco Use   Smoking status: Former Smoker    Packs/day: 1.00    Years: 20.00    Pack years: 20.00    Types: Cigarettes    Last attempt to quit: 1977    Years since quitting: 43.2   Smokeless tobacco: Never Used  Substance Use Topics   Alcohol use: No    ALLERGIES:  Allergies  Allergen Reactions   Cefuroxime Nausea Only   Codeine Nausea Only   Doxycycline Other (See Comments)    Lip swelling   Gabapentin Other (See Comments)    Swelling.    Iohexol Itching    07-15-13 pt developed itching on fingers after contrast given. Dr. Irish Elders looked at pt and said to put in system as allergy. BB   Other Other (See Comments)    Contrast Dye- caused fever, chills, awful feeling Bandages - itching, rash   Oxycodone Other (See Comments)    nausea   Quinolones Swelling    Lip swelling   Synvisc [Hylan G-F 20] Swelling     PERTINENT MEDICATIONS:  Outpatient Encounter Medications as of 08/12/2018   Medication Sig   albuterol (PROVENTIL HFA;VENTOLIN HFA) 108 (90 Base) MCG/ACT inhaler Inhale 2 puffs into the lungs every 4 (four) hours as needed for wheezing or shortness of breath.   aspirin EC 81 MG tablet Take 1 tablet (81 mg total) by mouth every other day.   calcium carbonate (TUMS EX) 750 MG chewable tablet Chew 1 tablet by mouth as needed for heartburn.    Cholecalciferol (VITAMIN D) 2000 UNITS CAPS Take 2,000 Units by mouth daily.   cyanocobalamin 500 MCG tablet Take 500 mcg every other day by mouth.   Diclofenac Sodium 1 % CREA Apply to left arm as needed for pain three times daily   dronabinol (MARINOL) 5 MG capsule TAKE 1 CAPSULE BY MOUTH TWICE DAILY BEFORE A MEAL   fluticasone (FLONASE) 50 MCG/ACT nasal spray Place 1-2 sprays into both nostrils daily.   furosemide (LASIX) 20 MG tablet TAKE 1-2 TABLETS BY MOUTH DAILY AS NEEDED FOR EDEMA  hydrocortisone cream 1 % Apply 1 application topically daily as needed for itching.    loratadine (CLARITIN) 10 MG tablet Take 10 mg by mouth daily.   Multiple Vitamins-Minerals (MULTIVITAMIN ADULT) CHEW Chew 1 tablet by mouth daily.    MYRBETRIQ 25 MG TB24 tablet TAKE ONE TABLET EVERY DAY   ondansetron (ZOFRAN) 8 MG tablet Take 1 tablet (8 mg total) by mouth 2 (two) times daily as needed (Nausea or vomiting).   pantoprazole (PROTONIX) 40 MG tablet TAKE 1 TABLET BY MOUTH TWICE DAILY BEFORE A MEAL   Polyethyl Glycol-Propyl Glycol (SYSTANE OP) Apply 1 drop to eye daily as needed (dry eyes).   potassium chloride (KLOR-CON) 20 MEQ packet Take 20 mEq by mouth daily.   pregabalin (LYRICA) 150 MG capsule Take 1 capsule (150 mg total) by mouth 2 (two) times daily.   prochlorperazine (COMPAZINE) 10 MG tablet Take 1 tablet (10 mg total) by mouth every 6 (six) hours as needed (Nausea or vomiting).   pyridOXINE (VITAMIN B-6) 100 MG tablet Take 1 tablet (100 mg total) by mouth daily.   SENNA-PLUS 8.6-50 MG tablet TAKE TWO TABLETS BY MOUTH  EVERY DAY   traMADol (ULTRAM) 50 MG tablet Take 1 tablet (50 mg total) by mouth every 6 (six) hours as needed.   traZODone (DESYREL) 50 MG tablet Take 0.5-1 tablets (25-50 mg total) by mouth at bedtime as needed for sleep.   chlorhexidine (PERIDEX) 0.12 % solution Use as directed 15 mLs in the mouth or throat 2 (two) times daily.   MONUROL 3 g PACK    [DISCONTINUED] sulfamethoxazole-trimethoprim (BACTRIM) 400-80 MG tablet Take 1 tablet by mouth 2 (two) times daily.   Facility-Administered Encounter Medications as of 08/12/2018  Medication   sodium chloride flush (NS) 0.9 % injection 10 mL    PHYSICAL EXAM:   General: NAD, frail appearing Extremities: 2+ edema to bilateral lower extremities  Ezekiel Slocumb, NP

## 2018-08-17 ENCOUNTER — Ambulatory Visit: Admission: RE | Admit: 2018-08-17 | Payer: Medicare Other | Source: Ambulatory Visit

## 2018-08-18 ENCOUNTER — Other Ambulatory Visit: Payer: Self-pay

## 2018-08-18 ENCOUNTER — Ambulatory Visit
Admission: RE | Admit: 2018-08-18 | Discharge: 2018-08-18 | Disposition: A | Discharge status: Home / Self Care | Payer: Medicare Other | Source: Ambulatory Visit | Attending: Oncology | Admitting: Oncology

## 2018-08-18 DIAGNOSIS — C159 Malignant neoplasm of esophagus, unspecified: Secondary | ICD-10-CM | POA: Insufficient documentation

## 2018-08-18 MED ORDER — IOHEXOL 300 MG/ML  SOLN
100.0000 mL | Freq: Once | INTRAMUSCULAR | Status: AC | PRN
  Administered 2018-08-18: 100 mL via INTRAVENOUS

## 2018-08-19 ENCOUNTER — Other Ambulatory Visit: Payer: Self-pay

## 2018-08-19 ENCOUNTER — Encounter: Payer: Self-pay | Admitting: Oncology

## 2018-08-19 ENCOUNTER — Inpatient Hospital Stay: Payer: Medicare Other

## 2018-08-19 ENCOUNTER — Telehealth: Payer: Self-pay | Admitting: Pharmacist

## 2018-08-19 ENCOUNTER — Inpatient Hospital Stay: Payer: Medicare Other | Attending: Oncology

## 2018-08-19 ENCOUNTER — Inpatient Hospital Stay (HOSPITAL_BASED_OUTPATIENT_CLINIC_OR_DEPARTMENT_OTHER): Payer: Medicare Other | Admitting: Hospice and Palliative Medicine

## 2018-08-19 ENCOUNTER — Inpatient Hospital Stay (HOSPITAL_BASED_OUTPATIENT_CLINIC_OR_DEPARTMENT_OTHER): Payer: Medicare Other | Admitting: Oncology

## 2018-08-19 VITALS — BP 139/76 | HR 86 | Temp 97.5°F | Resp 18 | Wt 193.3 lb

## 2018-08-19 DIAGNOSIS — M25519 Pain in unspecified shoulder: Secondary | ICD-10-CM | POA: Diagnosis not present

## 2018-08-19 DIAGNOSIS — Z85820 Personal history of malignant melanoma of skin: Secondary | ICD-10-CM

## 2018-08-19 DIAGNOSIS — I10 Essential (primary) hypertension: Secondary | ICD-10-CM

## 2018-08-19 DIAGNOSIS — R300 Dysuria: Secondary | ICD-10-CM

## 2018-08-19 DIAGNOSIS — J449 Chronic obstructive pulmonary disease, unspecified: Secondary | ICD-10-CM

## 2018-08-19 DIAGNOSIS — D6481 Anemia due to antineoplastic chemotherapy: Secondary | ICD-10-CM | POA: Diagnosis not present

## 2018-08-19 DIAGNOSIS — C7951 Secondary malignant neoplasm of bone: Secondary | ICD-10-CM | POA: Diagnosis not present

## 2018-08-19 DIAGNOSIS — Z803 Family history of malignant neoplasm of breast: Secondary | ICD-10-CM

## 2018-08-19 DIAGNOSIS — Z87440 Personal history of urinary (tract) infections: Secondary | ICD-10-CM

## 2018-08-19 DIAGNOSIS — K219 Gastro-esophageal reflux disease without esophagitis: Secondary | ICD-10-CM

## 2018-08-19 DIAGNOSIS — Z7982 Long term (current) use of aspirin: Secondary | ICD-10-CM | POA: Insufficient documentation

## 2018-08-19 DIAGNOSIS — N3000 Acute cystitis without hematuria: Secondary | ICD-10-CM

## 2018-08-19 DIAGNOSIS — G4733 Obstructive sleep apnea (adult) (pediatric): Secondary | ICD-10-CM

## 2018-08-19 DIAGNOSIS — Z7984 Long term (current) use of oral hypoglycemic drugs: Secondary | ICD-10-CM

## 2018-08-19 DIAGNOSIS — C155 Malignant neoplasm of lower third of esophagus: Secondary | ICD-10-CM

## 2018-08-19 DIAGNOSIS — K5900 Constipation, unspecified: Secondary | ICD-10-CM | POA: Insufficient documentation

## 2018-08-19 DIAGNOSIS — I2510 Atherosclerotic heart disease of native coronary artery without angina pectoris: Secondary | ICD-10-CM | POA: Diagnosis not present

## 2018-08-19 DIAGNOSIS — Z79899 Other long term (current) drug therapy: Secondary | ICD-10-CM | POA: Insufficient documentation

## 2018-08-19 DIAGNOSIS — C159 Malignant neoplasm of esophagus, unspecified: Secondary | ICD-10-CM

## 2018-08-19 DIAGNOSIS — Z923 Personal history of irradiation: Secondary | ICD-10-CM

## 2018-08-19 DIAGNOSIS — Z87442 Personal history of urinary calculi: Secondary | ICD-10-CM | POA: Insufficient documentation

## 2018-08-19 DIAGNOSIS — Z5111 Encounter for antineoplastic chemotherapy: Secondary | ICD-10-CM

## 2018-08-19 DIAGNOSIS — Z9225 Personal history of immunosupression therapy: Secondary | ICD-10-CM | POA: Diagnosis not present

## 2018-08-19 DIAGNOSIS — T451X5S Adverse effect of antineoplastic and immunosuppressive drugs, sequela: Secondary | ICD-10-CM | POA: Insufficient documentation

## 2018-08-19 DIAGNOSIS — Z95828 Presence of other vascular implants and grafts: Secondary | ICD-10-CM

## 2018-08-19 DIAGNOSIS — N3281 Overactive bladder: Secondary | ICD-10-CM

## 2018-08-19 DIAGNOSIS — M1990 Unspecified osteoarthritis, unspecified site: Secondary | ICD-10-CM

## 2018-08-19 DIAGNOSIS — Z515 Encounter for palliative care: Secondary | ICD-10-CM

## 2018-08-19 DIAGNOSIS — R740 Nonspecific elevation of levels of transaminase and lactic acid dehydrogenase [LDH]: Secondary | ICD-10-CM | POA: Insufficient documentation

## 2018-08-19 DIAGNOSIS — Z7951 Long term (current) use of inhaled steroids: Secondary | ICD-10-CM

## 2018-08-19 DIAGNOSIS — Z9884 Bariatric surgery status: Secondary | ICD-10-CM | POA: Insufficient documentation

## 2018-08-19 DIAGNOSIS — D72829 Elevated white blood cell count, unspecified: Secondary | ICD-10-CM | POA: Insufficient documentation

## 2018-08-19 DIAGNOSIS — Z9221 Personal history of antineoplastic chemotherapy: Secondary | ICD-10-CM | POA: Diagnosis not present

## 2018-08-19 DIAGNOSIS — Z87891 Personal history of nicotine dependence: Secondary | ICD-10-CM

## 2018-08-19 DIAGNOSIS — Z9989 Dependence on other enabling machines and devices: Secondary | ICD-10-CM

## 2018-08-19 DIAGNOSIS — R600 Localized edema: Secondary | ICD-10-CM | POA: Insufficient documentation

## 2018-08-19 DIAGNOSIS — Z7189 Other specified counseling: Secondary | ICD-10-CM

## 2018-08-19 LAB — URINALYSIS, COMPLETE (UACMP) WITH MICROSCOPIC
Bilirubin Urine: NEGATIVE
Glucose, UA: NEGATIVE mg/dL
Hgb urine dipstick: NEGATIVE
Ketones, ur: NEGATIVE mg/dL
Nitrite: NEGATIVE
Protein, ur: NEGATIVE mg/dL
Specific Gravity, Urine: 1.017 (ref 1.005–1.030)
pH: 6 (ref 5.0–8.0)

## 2018-08-19 LAB — COMPREHENSIVE METABOLIC PANEL WITH GFR
ALT: 22 U/L (ref 0–44)
AST: 49 U/L — ABNORMAL HIGH (ref 15–41)
Albumin: 2.9 g/dL — ABNORMAL LOW (ref 3.5–5.0)
Alkaline Phosphatase: 162 U/L — ABNORMAL HIGH (ref 38–126)
Anion gap: 8 (ref 5–15)
BUN: 20 mg/dL (ref 8–23)
CO2: 25 mmol/L (ref 22–32)
Calcium: 8.8 mg/dL — ABNORMAL LOW (ref 8.9–10.3)
Chloride: 104 mmol/L (ref 98–111)
Creatinine, Ser: 0.99 mg/dL (ref 0.44–1.00)
GFR calc Af Amer: >60 mL/min
GFR calc non Af Amer: 53 mL/min — ABNORMAL LOW
Glucose, Bld: 112 mg/dL — ABNORMAL HIGH (ref 70–99)
Potassium: 3.4 mmol/L — ABNORMAL LOW (ref 3.5–5.1)
Sodium: 137 mmol/L (ref 135–145)
Total Bilirubin: 0.5 mg/dL (ref 0.3–1.2)
Total Protein: 7.3 g/dL (ref 6.5–8.1)

## 2018-08-19 LAB — CBC WITH DIFFERENTIAL/PLATELET
Abs Immature Granulocytes: 0.19 10*3/uL — ABNORMAL HIGH (ref 0.00–0.07)
Basophils Absolute: 0 10*3/uL (ref 0.0–0.1)
Basophils Relative: 0 %
Eosinophils Absolute: 0 10*3/uL (ref 0.0–0.5)
Eosinophils Relative: 0 %
HCT: 30 % — ABNORMAL LOW (ref 36.0–46.0)
Hemoglobin: 9.1 g/dL — ABNORMAL LOW (ref 12.0–15.0)
Immature Granulocytes: 2 %
Lymphocytes Relative: 8 %
Lymphs Abs: 0.9 10*3/uL (ref 0.7–4.0)
MCH: 28.8 pg (ref 26.0–34.0)
MCHC: 30.3 g/dL (ref 30.0–36.0)
MCV: 94.9 fL (ref 80.0–100.0)
Monocytes Absolute: 0.8 10*3/uL (ref 0.1–1.0)
Monocytes Relative: 7 %
Neutro Abs: 9.9 10*3/uL — ABNORMAL HIGH (ref 1.7–7.7)
Neutrophils Relative %: 83 %
Platelets: 431 10*3/uL — ABNORMAL HIGH (ref 150–400)
RBC: 3.16 MIL/uL — ABNORMAL LOW (ref 3.87–5.11)
RDW: 17.2 % — ABNORMAL HIGH (ref 11.5–15.5)
WBC: 11.8 10*3/uL — ABNORMAL HIGH (ref 4.0–10.5)
nRBC: 0 % (ref 0.0–0.2)

## 2018-08-19 LAB — TSH: TSH: 2.532 u[IU]/mL (ref 0.350–4.500)

## 2018-08-19 MED ORDER — TRIFLURIDINE-TIPIRACIL 20-8.19 MG PO TABS: 40.0000 mg | ORAL_TABLET | Freq: Two times a day (BID) | ORAL | 3 refills | Status: DC

## 2018-08-19 MED ORDER — SODIUM CHLORIDE 0.9% FLUSH
10.0000 mL | Freq: Once | INTRAVENOUS | Status: AC
  Administered 2018-08-19: 10 mL via INTRAVENOUS
  Filled 2018-08-19: qty 10

## 2018-08-19 MED ORDER — TRIFLURIDINE-TIPIRACIL 15-6.14 MG PO TABS: ORAL_TABLET | ORAL | 3 refills | Status: AC

## 2018-08-19 MED ORDER — TRIFLURIDINE-TIPIRACIL 15-6.14 MG PO TABS: 30.0000 mg | ORAL_TABLET | Freq: Two times a day (BID) | ORAL | 3 refills | Status: DC

## 2018-08-19 MED ORDER — TRIFLURIDINE-TIPIRACIL 20-8.19 MG PO TABS: ORAL_TABLET | ORAL | 3 refills | Status: AC

## 2018-08-19 MED ORDER — HEPARIN SOD (PORK) LOCK FLUSH 100 UNIT/ML IV SOLN
500.0000 [IU] | Freq: Once | INTRAVENOUS | Status: AC
  Administered 2018-08-19: 500 [IU] via INTRAVENOUS

## 2018-08-19 MED ORDER — FOSFOMYCIN TROMETHAMINE 3 G PO PACK: 3.0000 g | PACK | Freq: Once | ORAL | 0 refills | Status: AC

## 2018-08-19 MED ORDER — HEPARIN SOD (PORK) LOCK FLUSH 100 UNIT/ML IV SOLN
INTRAVENOUS | Status: AC
  Filled 2018-08-19: qty 5

## 2018-08-19 NOTE — Telephone Encounter (Signed)
Oral Oncology Pharmacist Encounter   Prior Authorization for Frankey Poot has been approved.     PA# [id number] Effective dates: 08/19/2018 through 05/13/2019   Oral Oncology Clinic will continue to follow.   Darl Pikes, PharmD, BCPS. BCOP Hematology/Oncology Clinical Pharmacist [facility] [phone]  08/19/2018 2:13 PM

## 2018-08-19 NOTE — Progress Notes (Signed)
Patient here for follow up. Pt complains of burning when urinating.

## 2018-08-19 NOTE — Telephone Encounter (Signed)
Oral Oncology Pharmacist Encounter   Received notification from [employer] that prior authorization for [place] is required.   PA submitted on CMM Key [id number]  Status is pending   Oral Oncology Clinic will continue to follow.   Darl Pikes, PharmD, BCPS, [facility] Hematology/Oncology Clinical Pharmacist [facility] [place] [facility] [phone]  08/19/2018 2:12 PM

## 2018-08-19 NOTE — Progress Notes (Signed)
Hematology/Oncology Follow Up Note [facility]  [phone] [phone]  Patient Care Team: Tonia Ghent, MD as PCP - General (Family Medicine)   Name of the patient: [name]  [id number]  1935/02/04   REASON FOR VISIT Follow up of chemotherapy management of esophageal adenocarcinoma,   HISTORY OF PRESENT ILLNESS Patient is a 64-old female who has metastatic esophageal cancer with liver involvement.  Weight loss 20 pounds for the past year prior to diagnosis.  # EGD during admission showed partially obstructive esophageal mass at the distal part of esophagus. Biopsy was taken. Pathology showed esophageal adenocarcinoma. #US biopsy of liver lesion to proof distant metastasis.  Report family history of breast cancer, but she has been having routine mammograms.  # Lives alone. She's a former smoker.  # Molecular Testing:  #[employer] Cdx: No reportable alternations with companion diagnostic (CDx) claims.  MS stable Tumor mutation burden: Cannot be determined, CCND3 amplification: Equivocal.  Amended reports on 06/04/2017, Tumor mutational burden changed from "can not be determined" to "TMB -low" on the re-analyzed samples.  Tumor Proportion Score [TPS] 20%. # HER 2 negative.   Antineoplasm Treatments S/p 6 cycles of FOLFOX, PET scan showed excellent partial response from both chemotherapy and radiation, result was discussed with patient.  Patient was switched to 5-FU maintenance and to September 2019. S/p palliative radiation in January 2019 # April 2019 Developed esophageal stricture secondary to radiation in April and got dilation via endoscopy. EGD shoed Benign-appearing esophageal stenosis. This is a result of radiation therapy.- LA Grade C radiation esophagitis. - The previously noted esophageal mass in Nov 2018 was much decreased indicating good response to radiation/chemotherapy. - Normal stomach.- Normal examined jejunum.- No  specimens collected.   #01/23/2018 PET scan showed interval development of loculated ascites within the upper abdomen with overlies the liver and the spleen and exhibited increased radio tracer uptake.  Worrisome for peritoneal carcinomatosis.  Soft tissue infiltration within the omentum with increased radiotracer uptake noted.  Worrisome for peritoneal carcinomatosis.  New hypermetabolic internal mammary lymph nodes and a right CP angle lymph node.  Suspicious for metastatic adenopathy.  Mild FDG uptake is identified localizing to the lower third of the esophagus raising SUV max of 5.15.  # 01/26/2018 2nd line treatment Taxol and Cyramza  12/31/20219  CT chest abdomen pelvis unfortunately showed mixed response. # 05/27/2018 3nd line treatment with pembrolizumab.  07/21/2018 MRI of brain showed a subacute stroke. Patient was advised to restart on aspirin 81 mg.  And to follow-up with primary care physician. Patient has been seen by PCP and had a discussion about subacute stroke.  Patient stopped using aspirin because of increased bruising.  Decision was made to take aspirin 81 mg every other day.  Statin was not started given patient's metastatic esophageal cancer setting and a limited life span.  Metoprolol was discontinued due to low blood pressure.  # 08/18/2018 CT chest abdomen pelvis showed progression.   INTERVAL HISTORY  Patient with above history presents for discussion of interval CT scan results, discussion and evaluation for  Reports feeling well.  Denies any pain today.  Her sinusitis symptoms has resolved. Report having burning sensations during urination since beginning of the week.  She self medicated with leftover antibiotics for 3 days.-Reports antibiotic starting with "B", possible Bactrim.  Burning sensation has improved however not completely resolved.  Denies any diarrhea, rash, chest pain, cough, abdominal pain. She has established care with palliative care.  Appetite is  fair,  lost 1 pound since 3 weeks ago. Mild shortness of breath with exertion, no exacerbation or alleviating factors Occasionally she has back and shoulder pain and she takes Tylenol as needed.  She has tramadol at home as well. Chronic lower extremity swelling, stable.  she uses Lasix 20 mg 1 to 2 tablets as needed for edema. Dysphagia symptoms have resolved. Denies any difficulty in swallowing.    Review of Systems  Constitutional: Negative for appetite change, chills, fatigue, fever and unexpected weight change.  HENT:   Negative for hearing loss and voice change.   Eyes: Negative for eye problems.  Respiratory: Negative for chest tightness and cough.   Cardiovascular: Positive for leg swelling. Negative for chest pain.  Gastrointestinal: Negative for abdominal distention, abdominal pain and blood in stool.  Endocrine: Negative for hot flashes.  Genitourinary: Negative for difficulty urinating, dysuria and frequency.   Musculoskeletal: Negative for arthralgias and flank pain.  Skin: Negative for itching and rash.  Neurological: Positive for numbness. Negative for extremity weakness.  Hematological: Negative for adenopathy.  Psychiatric/Behavioral: Negative for confusion.     Allergies  Allergen Reactions  . Cefuroxime Nausea Only  . Codeine Nausea Only  . Doxycycline Other (See Comments)    Lip swelling  . Gabapentin Other (See Comments)    Swelling.   . Iohexol Itching    07-15-13 pt developed itching on fingers after contrast given. Dr. Irish Elders looked at pt and said to put in system as allergy. BB  . Other Other (See Comments)    Contrast Dye- caused fever, chills, awful feeling Bandages - itching, rash  . Oxycodone Other (See Comments)    nausea  . Quinolones Swelling    Lip swelling  . Synvisc [Hylan G-F 20] Swelling     Past Medical History:  Diagnosis Date  . Arthritis   . Asthma   . COPD (chronic obstructive pulmonary disease) ([place])   . Dysphonia   . Dyspnea   .  Esophageal cancer ([place])   . GERD (gastroesophageal reflux disease)   . Heart murmur   . History of kidney stones   . Hypertension   . Hypokalemia 10/07/2017  . Melanoma ([place])   . OAB (overactive bladder)   . OSA on CPAP   . 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 Personal history of chemotherapy    esophageal cancer  . Personal history of radiation therapy    esophageal cancer  . Pupil asymmetry    R pupil defect  . Skin cancer   . Sleep apnea   . Spinal cord abscess      Past Surgical History:  Procedure Laterality Date  . ABDOMINAL HYSTERECTOMY    . ANTERIOR CERVICAL DECOMP/DISCECTOMY FUSION N/A 07/16/2013   Procedure: ANTERIOR CERVICAL DECOMPRESSION/DISCECTOMY FUSION mutlipleLEVELS C4-7;  Surgeon: Erline Levine, MD;  Location: [place] [facility];  Service: Neurosurgery;  Laterality: N/A;  . APPENDECTOMY    . carpel tunn Bilateral   . CATARACT EXTRACTION    . CATARACT EXTRACTION, BILATERAL    . CHOLECYSTECTOMY    . dental implant    . ESOPHAGOGASTRODUODENOSCOPY Left 03/21/2017   Procedure: ESOPHAGOGASTRODUODENOSCOPY (EGD);  Surgeon: Virgel Manifold, MD;  Location: [facility] ENDOSCOPY;  Service: Endoscopy;  Laterality: Left;  . ESOPHAGOGASTRODUODENOSCOPY (EGD) WITH PROPOFOL N/A 08/20/2017   Procedure: ESOPHAGOGASTRODUODENOSCOPY (EGD) WITH PROPOFOL;  Surgeon: Virgel Manifold, MD;  Location: [facility] ENDOSCOPY;  Service: Endoscopy;  Laterality: N/A;  . ESOPHAGOGASTRODUODENOSCOPY (EGD) WITH PROPOFOL N/A 09/19/2017  Procedure: ESOPHAGOGASTRODUODENOSCOPY (EGD) WITH PROPOFOL;  Surgeon: Virgel Manifold, MD;  Location: [facility] ENDOSCOPY;  Service: Endoscopy;  Laterality: N/A;  . ESOPHAGOGASTRODUODENOSCOPY (EGD) WITH PROPOFOL N/A 10/03/2017   Procedure: ESOPHAGOGASTRODUODENOSCOPY (EGD) WITH PROPOFOL;  Surgeon: Virgel Manifold, MD;  Location: [facility] ENDOSCOPY;  Service: Endoscopy;  Laterality: N/A;  . ESOPHAGOGASTRODUODENOSCOPY (EGD) WITH PROPOFOL N/A 10/22/2017   Procedure:  ESOPHAGOGASTRODUODENOSCOPY (EGD) WITH PROPOFOL;  Surgeon: Virgel Manifold, MD;  Location: [facility] ENDOSCOPY;  Service: Endoscopy;  Laterality: N/A;  . ESOPHAGOGASTRODUODENOSCOPY (EGD) WITH PROPOFOL N/A 11/18/2017   Procedure: ESOPHAGOGASTRODUODENOSCOPY (EGD) WITH PROPOFOL;  Surgeon: Virgel Manifold, MD;  Location: [facility] ENDOSCOPY;  Service: Endoscopy;  Laterality: N/A;  . ESOPHAGOGASTRODUODENOSCOPY (EGD) WITH PROPOFOL N/A 12/17/2017   Procedure: ESOPHAGOGASTRODUODENOSCOPY (EGD) WITH PROPOFOL;  Surgeon: Virgel Manifold, MD;  Location: [facility] ENDOSCOPY;  Service: Endoscopy;  Laterality: N/A;  . ESOPHAGOGASTRODUODENOSCOPY (EGD) WITH PROPOFOL N/A 01/08/2018   Procedure: ESOPHAGOGASTRODUODENOSCOPY (EGD) WITH PROPOFOL;  Surgeon: Virgel Manifold, MD;  Location: [facility] ENDOSCOPY;  Service: Endoscopy;  Laterality: N/A;  . ESOPHAGOGASTRODUODENOSCOPY (EGD) WITH PROPOFOL N/A 01/22/2018   Procedure: ESOPHAGOGASTRODUODENOSCOPY (EGD) WITH PROPOFOL;  Surgeon: Virgel Manifold, MD;  Location: [facility] ENDOSCOPY;  Service: Endoscopy;  Laterality: N/A;  . ESOPHAGOGASTRODUODENOSCOPY (EGD) WITH PROPOFOL N/A 07/15/2018   Procedure: ESOPHAGOGASTRODUODENOSCOPY (EGD) WITH PROPOFOL;  Surgeon: Virgel Manifold, MD;  Location: [facility] ENDOSCOPY;  Service: Endoscopy;  Laterality: N/A;  . EYE SURGERY    . GASTRIC BYPASS    . HERNIA REPAIR    . HIATAL HERNIA REPAIR    . JOINT REPLACEMENT Bilateral    knee  . MELANOMA EXCISION    . PORTA CATH INSERTION N/A 04/07/2017   Procedure: PORTA CATH INSERTION;  Surgeon: Algernon Huxley, MD;  Location: [facility];  Service: Cardiovascular;  Laterality: N/A;  . POSTERIOR CERVICAL FUSION/FORAMINOTOMY N/A 07/28/2013   Procedure: Cervical four-seven  posterior cervical fusion;  Surgeon: Erline Levine, MD;  Location: [facility];  Service: Neurosurgery;  Laterality: N/A;  . TONSILLECTOMY    . TOTAL KNEE ARTHROPLASTY      Social History   Socioeconomic History  . Marital  status: Widowed    Spouse name: Not on file  . Number of children: 2  . Years of education: Not on file  . Highest education level: Master's degree (e.g., MA, MS, MEng, MEd, MSW, MBA)  Occupational History  . Not on file  Social Needs  . Financial resource strain: Not hard at all  . Food insecurity:    Worry: Never true    Inability: Never true  . Transportation needs:    Medical: No    Non-medical: No  Tobacco Use  . Smoking status: Former Smoker    Packs/day: 1.00    Years: 20.00    Pack years: 20.00    Types: Cigarettes    Last attempt to quit: 1977    Years since quitting: 43.2  . Smokeless tobacco: Never Used  Substance and Sexual Activity  . Alcohol use: No  . Drug use: Never  . Sexual activity: Not Currently  Lifestyle  . Physical activity:    Days per week: 2 days    Minutes per session: 30 min  . Stress: Not at all  Relationships  . Social connections:    Talks on phone: More than three times a week    Gets together: More than three times a week    Attends religious service: More than 4 times per year    Active  member of club or organization: Yes    Attends meetings of clubs or organizations: More than 4 times per year    Relationship status: Widowed  . Intimate partner violence:    Fear of current or ex partner: No    Emotionally abused: No    Physically abused: No    Forced sexual activity: No  Other Topics Concern  . Not on file  Social History Narrative   Marital Status- Widowed    Lives by herself    Employement- Retired Pharmacist, hospital   Exercise hx- Does PT     Family History  Problem Relation Age of Onset  . Breast cancer Mother 20  . Arthritis-Osteo Mother   . Breast cancer Sister 6  . Bladder Cancer Sister   . Bladder Cancer Father   . Tongue cancer Father   . Congenital heart disease Father   . Prostate cancer Brother   . Uterine cancer Maternal Aunt   . Lung cancer Paternal Uncle   . Leukemia Maternal Grandmother   . Liver cancer  Paternal Grandmother   . Colon cancer Neg Hx      Current Outpatient Medications:  .  albuterol (PROVENTIL HFA;VENTOLIN HFA) 108 (90 Base) MCG/ACT inhaler, Inhale 2 puffs into the lungs every 4 (four) hours as needed for wheezing or shortness of breath., Disp: , Rfl:  .  aspirin EC 81 MG tablet, Take 1 tablet (81 mg total) by mouth every other day., Disp: , Rfl:  .  calcium carbonate (TUMS EX) 750 MG chewable tablet, Chew 1 tablet by mouth as needed for heartburn. , Disp: , Rfl:  .  chlorhexidine (PERIDEX) 0.12 % solution, Use as directed 15 mLs in the mouth or throat 2 (two) times daily., Disp: , Rfl:  .  Cholecalciferol (VITAMIN D) 2000 UNITS CAPS, Take 2,000 Units by mouth daily., Disp: , Rfl:  .  dronabinol (MARINOL) 5 MG capsule, TAKE 1 CAPSULE BY MOUTH TWICE DAILY BEFORE A MEAL, Disp: 60 capsule, Rfl: 1 .  fluticasone (FLONASE) 50 MCG/ACT nasal spray, Place 1-2 sprays into both nostrils daily., Disp: 16 g, Rfl: 5 .  furosemide (LASIX) 20 MG tablet, TAKE 1-2 TABLETS BY MOUTH DAILY AS NEEDED FOR EDEMA, Disp: 180 tablet, Rfl: 1 .  hydrocortisone cream 1 %, Apply 1 application topically daily as needed for itching. , Disp: , Rfl:  .  loratadine (CLARITIN) 10 MG tablet, Take 10 mg by mouth daily., Disp: , Rfl:  .  Multiple Vitamins-Minerals (MULTIVITAMIN ADULT) CHEW, Chew 1 tablet by mouth daily. , Disp: , Rfl:  .  MYRBETRIQ 25 MG TB24 tablet, TAKE ONE TABLET EVERY DAY, Disp: 30 tablet, Rfl: 2 .  pantoprazole (PROTONIX) 40 MG tablet, TAKE 1 TABLET BY MOUTH TWICE DAILY BEFORE A MEAL, Disp: 60 tablet, Rfl: 3 .  Polyethyl Glycol-Propyl Glycol (SYSTANE OP), Apply 1 drop to eye daily as needed (dry eyes)., Disp: , Rfl:  .  potassium chloride (KLOR-CON) 20 MEQ packet, Take 20 mEq by mouth daily., Disp: 30 packet, Rfl: 1 .  pregabalin (LYRICA) 150 MG capsule, Take 1 capsule (150 mg total) by mouth 2 (two) times daily., Disp: 60 capsule, Rfl: 0 .  pyridOXINE (VITAMIN B-6) 100 MG tablet, Take 1 tablet  (100 mg total) by mouth daily., Disp: 30 tablet, Rfl: 3 .  SENNA-PLUS 8.6-50 MG tablet, TAKE TWO TABLETS BY MOUTH EVERY DAY, Disp: 60 tablet, Rfl: 0 .  traMADol (ULTRAM) 50 MG tablet, Take 1 tablet (50 mg total) by mouth  every 6 (six) hours as needed., Disp: 60 tablet, Rfl: 0 .  traZODone (DESYREL) 50 MG tablet, Take 0.5-1 tablets (25-50 mg total) by mouth at bedtime as needed for sleep., Disp: , Rfl:  .  Diclofenac Sodium 1 % CREA, Apply to left arm as needed for pain three times daily (Patient not taking: Reported on 08/19/2018), Disp: 120 g, Rfl: 0 .  fosfomycin (MONUROL) 3 g PACK, Take 3 g by mouth once for 1 dose., Disp: 3 g, Rfl: 0 .  oxybutynin (DITROPAN) 5 MG tablet, , Disp: , Rfl:  .  potassium chloride SA (K-DUR,KLOR-CON) 20 MEQ tablet, , Disp: , Rfl:  .  trifluridine-tipiracil (LONSURF) 15-6.14 MG tablet, Take 2 tablets by mouth two (2) times daily after a meal. Take with two '20mg'$  tablets. Take only on days 1-5, 8-12. Repeat every 28days., Disp: 40 tablet, Rfl: 3 .  trifluridine-tipiracil (LONSURF) 20-8.19 MG tablet, Take 2 tablets by mouth two (2) times daily after a meal. Take with two '15mg'$  tablets. Take only on days 1-5, 8-12. Repeat every 28days., Disp: 40 tablet, Rfl: 3 No current facility-administered medications for this visit.   Facility-Administered Medications Ordered in Other Visits:  .  sodium chloride flush (NS) 0.9 % injection 10 mL, 10 mL, Intravenous, PRN, Earlie Server, MD, 10 mL at 01/26/18 0904  Physical exam:  Vitals:   08/19/18 0857  BP: 139/76  Pulse: 86  Resp: 18  Temp: (!) 97.5 F (36.4 C)  Weight: 193 lb 4.8 oz (87.7 kg)  ECOG 2 Physical Exam  Constitutional: She is oriented to person, place, and time. No distress.  HENT:  Head: Normocephalic and atraumatic.  Nose: Nose normal.  Mouth/Throat: Oropharynx is clear and moist. No oropharyngeal exudate.  Eyes: Pupils are equal, round, and reactive to light. Conjunctivae and EOM are normal. No scleral icterus.   Neck: Normal range of motion. Neck supple. No JVD present.  Cardiovascular: Normal rate and regular rhythm.  Murmur heard. Pulmonary/Chest: Effort normal and breath sounds normal. No respiratory distress. She has no wheezes. She has no rales. She exhibits no tenderness.  Abdominal: Soft. Bowel sounds are normal. She exhibits no distension. There is no abdominal tenderness.  Musculoskeletal: Normal range of motion.        General: Edema present. No tenderness or deformity.  Lymphadenopathy:    She has no cervical adenopathy.  Neurological: She is alert and oriented to person, place, and time. No cranial nerve deficit. She exhibits normal muscle tone. Coordination normal.  Skin: Skin is warm and dry. She is not diaphoretic. No erythema.  Psychiatric: Affect and judgment normal.    CMP Latest Ref Rng & Units 08/19/2018  Glucose 70 - 99 mg/dL 112(H)  BUN 8 - 23 mg/dL 20  Creatinine 0.44 - 1.00 mg/dL 0.99  Sodium 135 - 145 mmol/L 137  Potassium 3.5 - 5.1 mmol/L 3.4(L)  Chloride 98 - 111 mmol/L 104  CO2 22 - 32 mmol/L 25  Calcium 8.9 - 10.3 mg/dL 8.8(L)  Total Protein 6.5 - 8.1 g/dL 7.3  Total Bilirubin 0.3 - 1.2 mg/dL 0.5  Alkaline Phos 38 - 126 U/L 162(H)  AST 15 - 41 U/L 49(H)  ALT 0 - 44 U/L 22   CBC Latest Ref Rng & Units 08/19/2018  WBC 4.0 - 10.5 K/uL 11.8(H)  Hemoglobin 12.0 - 15.0 g/dL 9.1(L)  Hematocrit 36.0 - 46.0 % 30.0(L)  Platelets 150 - 400 K/uL 431(H)   RADIOGRAPHIC STUDIES: I have personally reviewed the radiological images  as listed and agreed with the findings in the report.  PET scan 01/23/2018  Interval development of loculated ascites within the upper abdomen which overlies the liver and spleen and exhibits increased radiotracer uptake. Findings worrisome for peritoneal carcinomatosis. Consider further evaluation with diagnostic paracentesis. 2. Soft tissue infiltration within the omentum with increased radiotracer uptake noted. Also worrisome for peritoneal  carcinomatosis. 3. New hypermetabolic internal mammary lymph nodes and right CP angle lymph node. Suspicious for metastatic adenopathy. 4. Mild FDG uptake is identified localizing to the lower third of the esophagus within SUV max of 5.15.  Bone Scan 05/08/2018  1. Several small foci of activity in anterior ribs as noted above, and metastatic involvement can not be excluded. 2. Somewhat mottled activity within the midthoracic and upper lumbar spine and metastatic involvement can not be excluded. Correlate with plain films or MRI preferably.  08/18/2018 CT chest abdomen pelvis with contrast 1. New and enlarging tumors throughout the liver compatible with progressive malignancy. 2. New 2.0 by 3.1 cm soft tissue density between the stomach in the liver, probably a malignant deposit/lymph node. 3. Stable mild circumferential wall thickening in the distal esophagus. Prior gastric bypass. 4. Other imaging findings of potential clinical significance: Aortic Atherosclerosis (ICD10-I70.0). Coronary atherosclerosis. Mild cardiomegaly. Aortic and mitral valve calcifications. Mild increase in right middle lobe atelectasis without obvious tumor in the right middle lobe. Scarring or atelectasis in the lung bases. New trace left pleural effusion. Thoracolumbar spondylosis and degenerative disc disease. Non-specific 3 mm hypodense lesion in the spleen. Stable left kidney lower pole staghorn calculus, nonobstructive. Cutaneous thickening in the right lower quadrant anterior abdominal wall, possibly from low-grade panniculitis.  Assessment and plan  Cancer Staging Malignant neoplasm of lower third of esophagus [facility]) Staging form: Esophagus - Adenocarcinoma, AJCC 8th Edition - Clinical stage from 04/10/2017: Stage IVB (cTX, cNX, pM1) - Signed by Earlie Server, MD on 04/10/2017  1. Esophageal adenocarcinoma ([place])   2. Dysuria   3. Acute cystitis without hematuria   4. Bone metastasis ([place])   5. Port-A-Cath in place    6. Bilateral lower extremity edema   7. Encounter for antineoplastic chemotherapy   8. Leukocytosis, unspecified type   9. Goals of care, counseling/discussion    #Esophageal cancer, metastatic Interval CT chest abdomen pelvis image was independently reviewed by me and discussed with patient and her sister who was on phone speaker. Unfortunately did not respond to immunotherapy.  She had difficult disease progression. Recommend stopping Keytruda. First-line treatment option discussed with patient. I recommend starting Lonsurf 35 mg/m BID, day 1-5, and day 8-12 every 28 cycles. - TAGS trial,significantly improved overall survival over placebo (median 5.7 versus 3.6 months) and was reasonably well tolerated. The most frequent grade 3 or higher adverse effects were neutropenia and anemia in the trifluridine/tipiracil group.  Rationale and potential side effects discussed with patient.  She voices understanding and agree with the plan.  Goal of care has been discussed.  She is aware that there is no cure for her condition.   Prognosis is poor.  She has establish care with palliative care and will also see Vonna Kotyk Borders today. Clinically she appears to be comfortable, with occasional back and shoulder pain controlled with Tylenol as needed.  #She does have recurrent UTI history.  Today she reports dysuria symptoms and although UA is negative.  Patient has self medicated with Bactrim for 3 days. I would start treatment with fosfomycin empirically 3 g x 1 dose. Awaiting urine culture. Plan  starting Lonsurf next week if urinary symptoms resolves. She has had labs done. I have discussed with pharmacist Alyson and she will look into patient's insurance coverage.    #Depression/insomnia, continue trazodone 50 mg nightly. #Bone metastasis, discussed with patient about obtaining dental clearance for Zometa use. #Constipation, continue Senokot 2 tabs daily. #Transaminitis, slight elevated AST at  58.  Slightly increased since last visit.  Continue to monitor.   Follow-up 1 week after starting Lonsurf  Orders Placed This Encounter  Procedures  . Urine Culture    Standing Status:   Future    Number of Occurrences:   1    Standing Expiration Date:   08/19/2019  . Urinalysis, Complete w Microscopic    Standing Status:   Future    Number of Occurrences:   1    Standing Expiration Date:   08/19/2019  . CBC with Differential/Platelet    Standing Status:   Standing    Number of Occurrences:   20    Standing Expiration Date:   08/19/2019  . Comprehensive metabolic panel    Standing Status:   Standing    Number of Occurrences:   20    Standing Expiration Date:   08/19/2019    Earlie Server, MD, PhD Hematology Oncology [facility] Pager- [phone] 08/19/18

## 2018-08-19 NOTE — Progress Notes (Signed)
DISCONTINUE ON PATHWAY REGIMEN - Gastroesophageal     A cycle is 21 days:     Pembrolizumab   **Always confirm dose/schedule in your pharmacy ordering system**  REASON: Disease Progression PRIOR TREATMENT: GEOS22: Pembrolizumab 200 mg q21 Days Until Progression, Unacceptable Toxicity, or Up to 24 Months TREATMENT RESPONSE: Progressive Disease (PD)  START ON PATHWAY REGIMEN - Gastroesophageal     A cycle is 28 days:     Trifluridine and tipiracil   **Always confirm dose/schedule in your pharmacy ordering system**  Patient Characteristics: Distant Metastases (cM1/pM1) / Locally Recurrent Disease, Adenocarcinoma - Esophageal, GE Junction, and Gastric, Third Line and Beyond, MSS/pMMR or MSI Unknown, PD?L1 Expression Negative/Unknown or Prior PD-1/PD-L1 Inhibitor Histology: Adenocarcinoma Disease Classification: Esophageal Therapeutic Status: Distant Metastases (No Additional Staging) Line of Therapy: Third Engineer, civil (consulting) Status: MSS/pMMR PD-L1 Expression Status: PD-L1 Positive (CPS ? 1) Prior Immunotherapy Status: Prior PD-1/PD-L1 Inhibitor Intent of Therapy: Non-Curative / Palliative Intent, Discussed with Patient

## 2018-08-19 NOTE — Telephone Encounter (Signed)
Oral Oncology Pharmacist Encounter  Received new prescription for Lonsurf (trifluridine and tipiracil) for the treatment of metastatic esophageal cancer, planned duration until disease progression or unacceptable drug toxicity. Patient has been previously treated with FOLFOX, Taxol and Cyramza, and Keytruda. Her is HER2 negative.  CBC/CMP from 08/19/2018 assessed, no relevant lab abnormalities. Prescription dose and frequency assessed.   Current medication list in Epic reviewed, no DDIs with Lonsurf identified.  Prescription has been e-scribed to the [facility] for benefits analysis and approval.  Oral Oncology Clinic will continue to follow for insurance authorization, copayment issues, initial counseling and start date.  Darl Pikes, PharmD, BCPS, [facility] Hematology/Oncology Clinical Pharmacist [facility] [phone]  08/19/2018 10:16 AM

## 2018-08-19 NOTE — Progress Notes (Signed)
[place]  [phone] [phone]   Name: [name] Date: 08/19/2018 MRN: [id number]  DOB: [birth year]  Patient Care Team: Tonia Ghent, MD as PCP - General (Family Medicine)    REASON FOR CONSULTATION: Palliative Care consult requested for this 84 y.o. female with multiple medical problems including stage IV esophageal cancer s/p chemotherapy, immunotherapy, and XRT with disease progression most recently on Keytruda. 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 Patient also has recurrent UTIs and was last hospitalized  04/12/2018  to 04/14/18 with same. Palliative care was consulted to help address goals.   SOCIAL HISTORY:     reports that she quit smoking about 43 years ago. Her smoking use included cigarettes. She has a 20.00 pack-year smoking history. She has never used smokeless tobacco. She reports that she does not drink alcohol or use drugs.   Patient is widowed.  She lives at home alone.  She has 2 sons, 1 of whom lives in [place] and the other in [place].  Patient is a retired [employer] account executive.  ADVANCE DIRECTIVES:  Patient's son, Yvone Neu, is her healthcare power of attorney.  CODE STATUS: Previously DNR  PAST MEDICAL HISTORY: Past Medical History:  Diagnosis Date   Arthritis    Asthma    COPD (chronic obstructive pulmonary disease) (HCC)    Dysphonia    Dyspnea    Esophageal cancer (HCC)    GERD (gastroesophageal reflux disease)    Heart murmur    History of kidney stones    Hypertension    Hypokalemia 10/07/2017   Melanoma (HCC)    OAB (overactive bladder)    OSA on CPAP    Paresthesia    from neck down after spinal abscess   Personal history of chemotherapy    esophageal cancer   Personal history of radiation therapy    esophageal cancer   Pupil asymmetry    R pupil defect    Skin cancer    Sleep apnea    Spinal cord abscess     PAST SURGICAL HISTORY:  Past Surgical History:  Procedure Laterality Date   ABDOMINAL HYSTERECTOMY     ANTERIOR CERVICAL DECOMP/DISCECTOMY FUSION N/A 07/16/2013   Procedure: ANTERIOR CERVICAL DECOMPRESSION/DISCECTOMY FUSION mutlipleLEVELS C4-7;  Surgeon: Erline Levine, MD;  Location: [facility];  Service: Neurosurgery;  Laterality: N/A;   APPENDECTOMY     carpel tunn Bilateral    CATARACT EXTRACTION     CATARACT EXTRACTION, BILATERAL     CHOLECYSTECTOMY     dental implant     ESOPHAGOGASTRODUODENOSCOPY Left 03/21/2017   Procedure: ESOPHAGOGASTRODUODENOSCOPY (EGD);  Surgeon: Virgel Manifold, MD;  Location: [facility] ENDOSCOPY;  Service: Endoscopy;  Laterality: Left;   ESOPHAGOGASTRODUODENOSCOPY (EGD) WITH PROPOFOL N/A 08/20/2017   Procedure: ESOPHAGOGASTRODUODENOSCOPY (EGD) WITH PROPOFOL;  Surgeon: Virgel Manifold, MD;  Location: [facility] ENDOSCOPY;  Service: Endoscopy;  Laterality: N/A;   ESOPHAGOGASTRODUODENOSCOPY (EGD) WITH PROPOFOL N/A 09/19/2017   Procedure: ESOPHAGOGASTRODUODENOSCOPY (EGD) WITH PROPOFOL;  Surgeon: Virgel Manifold, MD;  Location: [facility] ENDOSCOPY;  Service: Endoscopy;  Laterality: N/A;   ESOPHAGOGASTRODUODENOSCOPY (EGD) WITH PROPOFOL N/A 10/03/2017   Procedure: ESOPHAGOGASTRODUODENOSCOPY (EGD) WITH PROPOFOL;  Surgeon: Virgel Manifold, MD;  Location: [facility] ENDOSCOPY;  Service: Endoscopy;  Laterality: N/A;   ESOPHAGOGASTRODUODENOSCOPY (EGD) WITH PROPOFOL N/A 10/22/2017   Procedure: ESOPHAGOGASTRODUODENOSCOPY (EGD) WITH PROPOFOL;  Surgeon: Virgel Manifold, MD;  Location: [facility] ENDOSCOPY;  Service: Endoscopy;  Laterality: N/A;   ESOPHAGOGASTRODUODENOSCOPY (EGD) WITH PROPOFOL N/A 11/18/2017   Procedure: ESOPHAGOGASTRODUODENOSCOPY (EGD) WITH PROPOFOL;  Surgeon: Virgel Manifold, MD;  Location: [facility] ENDOSCOPY;  Service: Endoscopy;  Laterality: N/A;   ESOPHAGOGASTRODUODENOSCOPY (EGD) WITH PROPOFOL  N/A 12/17/2017   Procedure: ESOPHAGOGASTRODUODENOSCOPY (EGD) WITH PROPOFOL;  Surgeon: Virgel Manifold, MD;  Location: [facility] ENDOSCOPY;  Service: Endoscopy;  Laterality: N/A;   ESOPHAGOGASTRODUODENOSCOPY (EGD) WITH PROPOFOL N/A 01/08/2018   Procedure: ESOPHAGOGASTRODUODENOSCOPY (EGD) WITH PROPOFOL;  Surgeon: Virgel Manifold, MD;  Location: [facility] ENDOSCOPY;  Service: Endoscopy;  Laterality: N/A;   ESOPHAGOGASTRODUODENOSCOPY (EGD) WITH PROPOFOL N/A 01/22/2018   Procedure: ESOPHAGOGASTRODUODENOSCOPY (EGD) WITH PROPOFOL;  Surgeon: Virgel Manifold, MD;  Location: [facility] ENDOSCOPY;  Service: Endoscopy;  Laterality: N/A;   ESOPHAGOGASTRODUODENOSCOPY (EGD) WITH PROPOFOL N/A 07/15/2018   Procedure: ESOPHAGOGASTRODUODENOSCOPY (EGD) WITH PROPOFOL;  Surgeon: Virgel Manifold, MD;  Location: [facility] ENDOSCOPY;  Service: Endoscopy;  Laterality: N/A;   EYE SURGERY     GASTRIC BYPASS     HERNIA REPAIR     HIATAL HERNIA REPAIR     JOINT REPLACEMENT Bilateral    knee   MELANOMA EXCISION     PORTA CATH INSERTION N/A 04/07/2017   Procedure: PORTA CATH INSERTION;  Surgeon: Algernon Huxley, MD;  Location: [facility];  Service: Cardiovascular;  Laterality: N/A;   POSTERIOR CERVICAL FUSION/FORAMINOTOMY N/A 07/28/2013   Procedure: Cervical four-seven  posterior cervical fusion;  Surgeon: Erline Levine, MD;  Location: [place] [facility]UR[facility];  Service: Neurosurgery;  Laterality: N/A;   TONSILLECTOMY     TOTAL KNEE ARTHROPLASTY      HEMATOLOGY/ONCOLOGY HISTORY:  Oncology History   Patient is a 84 year old female who has metastatic esophageal cancer with liver involvement.  Weight loss 20 pounds for the past year prior to diagnosis.  #EGD during admission showed partially obstructive esophageal mass at the distal part of esophagus. Biopsy was taken. Pathology showed esophageal adenocarcinoma. #US biopsy of liver lesion to proof distant metastasis.  Report family history of breast cancer,but she has been  having routine mammograms.  # Lives alone.She's a former smoker.  # Molecular Testing:  #[employer] Cdx: No reportable alternations with companion diagnostic (CDx) claims.  MS stable Tumor mutation burden: Cannot be determined, CCND3 amplification: Equivocal.  Amended reports on 06/04/2017, Tumor mutational burden changed from can not be determined to "TMB -low" on the re-analyzed samples.  # HER 2 negative.   Current Treatment S/p 6 cycles of FOLFOX, PET scan showed excellent partial response from both chemotherapy and radiation, result was discussed with patient.  currently on 5-FU maintenance.  S/p palliative radiation in January 2019 # Developed esophageal stricture secondary to radiation in April and got dilation via endoscopy. EGD shoed Benign-appearing esophageal stenosis. This is a result of radiation therapy.- LA Grade C radiation esophagitis. - The previously noted esophageal mass in Nov 2018 was much decreased indicating good response to radiation/chemotherapy. - Normal stomach.- Normal examined jejunum.- No specimens collected.        Malignant neoplasm of lower third of esophagus (HCC)   03/29/2017 Initial Diagnosis    Malignant neoplasm of lower third of esophagus ([place])    01/27/2018 - 05/24/2018 Chemotherapy    The patient had PACLitaxel (TAXOL) 162 mg in sodium chloride 0.9 % 250 mL chemo infusion (</= 81m/m2), 80 mg/m2 = 162 mg, Intravenous,  Once, 4 of 7 cycles Dose modification: 65 mg/m2 (original dose 80 mg/m2, Cycle 2, Reason: Provider Judgment), 70 mg/m2 (original dose 80 mg/m2,  Cycle 4, Reason: Provider Judgment), 70 mg/m2 (original dose 80 mg/m2, Cycle 4, Reason: Provider Judgment) Administration: 162 mg (01/27/2018), 162 mg (03/02/2018), 132 mg (03/09/2018), 162 mg (02/03/2018), 162 mg (02/09/2018), 162 mg (02/23/2018), 162 mg (03/23/2018), 162 mg (03/30/2018), 162 mg (04/06/2018), 138 mg (04/27/2018), 138 mg (05/04/2018), 138 mg (05/11/2018) ramucirumab (CYRAMZA)  700 mg in sodium chloride 0.9 % 180 mL chemo infusion, 8 mg/kg = 700 mg, Intravenous, Once, 4 of 7 cycles Administration: 700 mg (01/27/2018), 700 mg (03/09/2018), 700 mg (02/09/2018), 700 mg (02/23/2018), 700 mg (03/23/2018), 700 mg (04/06/2018), 700 mg (04/27/2018), 700 mg (05/11/2018)  for chemotherapy treatment.     08/24/2018 -  Chemotherapy    The patient had [No matching medication found in this treatment plan]  for chemotherapy treatment.      Esophageal adenocarcinoma ([place])    Initial Diagnosis    Esophageal adenocarcinoma ([place])    05/27/2018 - 08/18/2018 Chemotherapy    The patient had pembrolizumab (KEYTRUDA) 200 mg in sodium chloride 0.9 % 50 mL chemo infusion, 200 mg, Intravenous, Once, 4 of 5 cycles Administration: 200 mg (05/27/2018), 200 mg (06/17/2018), 200 mg (07/08/2018), 200 mg (07/29/2018)  for chemotherapy treatment.     08/24/2018 -  Chemotherapy    The patient had [No matching medication found in this treatment plan]  for chemotherapy treatment.      ALLERGIES:  is allergic to cefuroxime; codeine; doxycycline; gabapentin; iohexol; other; oxycodone; quinolones; and synvisc [hylan g-f 20].  MEDICATIONS:  Current Outpatient Medications  Medication Sig Dispense Refill   albuterol (PROVENTIL HFA;VENTOLIN HFA) 108 (90 Base) MCG/ACT inhaler Inhale 2 puffs into the lungs every 4 (four) hours as needed for wheezing or shortness of breath.     aspirin EC 81 MG tablet Take 1 tablet (81 mg total) by mouth every other day.     calcium carbonate (TUMS EX) 750 MG chewable tablet Chew 1 tablet by mouth as needed for heartburn.      chlorhexidine (PERIDEX) 0.12 % solution Use as directed 15 mLs in the mouth or throat 2 (two) times daily.     Cholecalciferol (VITAMIN D) 2000 UNITS CAPS Take 2,000 Units by mouth daily.     Diclofenac Sodium 1 % CREA Apply to left arm as needed for pain three times daily (Patient not taking: Reported on 08/19/2018) 120 g 0   dronabinol (MARINOL) 5 MG  capsule TAKE 1 CAPSULE BY MOUTH TWICE DAILY BEFORE A MEAL 60 capsule 1   fluticasone (FLONASE) 50 MCG/ACT nasal spray Place 1-2 sprays into both nostrils daily. 16 g 5   fosfomycin (MONUROL) 3 g PACK Take 3 g by mouth once for 1 dose. 3 g 0   furosemide (LASIX) 20 MG tablet TAKE 1-2 TABLETS BY MOUTH DAILY AS NEEDED FOR EDEMA 180 tablet 1   hydrocortisone cream 1 % Apply 1 application topically daily as needed for itching.      loratadine (CLARITIN) 10 MG tablet Take 10 mg by mouth daily.     Multiple Vitamins-Minerals (MULTIVITAMIN ADULT) CHEW Chew 1 tablet by mouth daily.      MYRBETRIQ 25 MG TB24 tablet TAKE ONE TABLET EVERY DAY 30 tablet 2   oxybutynin (DITROPAN) 5 MG tablet      pantoprazole (PROTONIX) 40 MG tablet TAKE 1 TABLET BY MOUTH TWICE DAILY BEFORE A MEAL 60 tablet 3   Polyethyl Glycol-Propyl Glycol (SYSTANE OP) Apply 1 drop to eye daily as needed (dry eyes).     potassium chloride (KLOR-CON) 20 MEQ  packet Take 20 mEq by mouth daily. 30 packet 1   potassium chloride SA (K-DUR,KLOR-CON) 20 MEQ tablet      pregabalin (LYRICA) 150 MG capsule Take 1 capsule (150 mg total) by mouth 2 (two) times daily. 60 capsule 0   pyridOXINE (VITAMIN B-6) 100 MG tablet Take 1 tablet (100 mg total) by mouth daily. 30 tablet 3   SENNA-PLUS 8.6-50 MG tablet TAKE TWO TABLETS BY MOUTH EVERY DAY 60 tablet 0   traMADol (ULTRAM) 50 MG tablet Take 1 tablet (50 mg total) by mouth every 6 (six) hours as needed. 60 tablet 0   traZODone (DESYREL) 50 MG tablet Take 0.5-1 tablets (25-50 mg total) by mouth at bedtime as needed for sleep.     No current facility-administered medications for this visit.    Facility-Administered Medications Ordered in Other Visits  Medication Dose Route Frequency Provider Last Rate Last Dose   sodium chloride flush (NS) 0.9 % injection 10 mL  10 mL Intravenous PRN Earlie Server, MD   10 mL at 01/26/18 0904    VITAL SIGNS: There were no vitals taken for this  visit. There were no vitals filed for this visit.  Estimated body mass index is 35.36 kg/m as calculated from the following:   Height as of 07/24/18: '5\' 2"'  (1.575 m).   Weight as of an earlier encounter on 08/19/18: 193 lb 4.8 oz (87.7 kg).  LABS: CBC:    Component Value Date/Time   WBC 11.8 (H) 08/19/2018 0834   HGB 9.1 (L) 08/19/2018 0834   HGB 12.9 07/13/2013 0609   HCT 30.0 (L) 08/19/2018 0834   HCT 38.8 07/13/2013 0609   PLT 431 (H) 08/19/2018 0834   PLT 212 07/13/2013 0609   MCV 94.9 08/19/2018 0834   MCV 93 07/13/2013 0609   NEUTROABS 9.9 (H) 08/19/2018 0834   LYMPHSABS 0.9 08/19/2018 0834   MONOABS 0.8 08/19/2018 0834   EOSABS 0.0 08/19/2018 0834   BASOSABS 0.0 08/19/2018 0834   Comprehensive Metabolic Panel:    Component Value Date/Time   NA 137 08/19/2018 0834   NA 138 07/13/2013 0609   K 3.4 (L) 08/19/2018 0834   K 4.1 07/13/2013 0609   CL 104 08/19/2018 0834   CL 104 07/13/2013 0609   CO2 25 08/19/2018 0834   CO2 29 07/13/2013 0609   BUN 20 08/19/2018 0834   BUN 15 07/13/2013 0609   CREATININE 0.99 08/19/2018 0834   CREATININE 0.67 07/13/2013 0609   GLUCOSE 112 (H) 08/19/2018 0834   GLUCOSE 115 (H) 07/13/2013 0609   CALCIUM 8.8 (L) 08/19/2018 0834   CALCIUM 8.3 (L) 07/13/2013 0609   AST 49 (H) 08/19/2018 0834   ALT 22 08/19/2018 0834   ALKPHOS 162 (H) 08/19/2018 0834   BILITOT 0.5 08/19/2018 0834   PROT 7.3 08/19/2018 0834   ALBUMIN 2.9 (L) 08/19/2018 0834    RADIOGRAPHIC STUDIES: Ct Chest W Contrast  Result Date: 08/18/2018 CLINICAL DATA:  Restaging of esophageal cancer metastatic to the liver. EXAM: CT CHEST, ABDOMEN, AND PELVIS WITH CONTRAST TECHNIQUE: Multidetector CT imaging of the chest, abdomen and pelvis was performed following the standard protocol during bolus administration of intravenous contrast. CONTRAST:  165m OMNIPAQUE IOHEXOL 300 MG/ML  SOLN COMPARISON:  Multiple exams, including 05/12/2018 FINDINGS: CT CHEST FINDINGS Cardiovascular:  Right Port-A-Cath tip: SVC. Coronary, aortic arch, and branch vessel atherosclerotic vascular disease. Mild cardiomegaly. Aortic valve and mitral valve calcifications. Mediastinum/Nodes: Contrast medium and gas in the esophagus suggesting dysmotility or reflux. Persistent  generally mild to moderate circumferential wall thickening of the distal esophagus. Lungs/Pleura: Increase in right middle lobe atelectasis especially medially. Subsegmental atelectasis or scarring inferiorly in the right upper lobe. Scarring or atelectasis in the lingula and in the left lower lobe along the hemidiaphragm. New trace left pleural effusion. Musculoskeletal: Cervical fixators are noted. Thoracic spondylosis and mild thoracic kyphosis. Thoracic degenerative disc disease. CT ABDOMEN PELVIS FINDINGS Hepatobiliary: New and enlarging liver masses. A previous 1.4 by 1.2 cm lateral segment left hepatic lobe mass is currently solid and measures 5.3 by 4.9 cm. A previously cystic 2.5 by 2.8 cm right hepatic lobe lesion is currently cystic and solid, invades into the caudate lobe, and measures 12.7 by 8.6 cm. One of several new right hepatic lobe masses measures 4.9 by 3.7 cm on image 63/2. Gallbladder surgically absent. There is some mass effect on the right portal vein due to hepatic masses. No tumor thrombus is identified in the portal vein Pancreas: Unremarkable Spleen: Non-specific 3 mm hypodensity in the spleen on image 54/2, questionably present previously. Adrenals/Urinary Tract: Stable small hypodense right renal lesions are likely cysts but technically nonspecific due to small size. Stable 1.8 by 0.7 cm staghorn calculus in the left kidney lower pole. Urinary bladder unremarkable. Stomach/Bowel: Prior gastric bypass. Soft tissue density raise the possibility of tumor between the stomach and the liver measuring 2.0 by 3.1 cm on image 56/2. Vascular/Lymphatic: Aortoiliac atherosclerotic vascular disease. Reproductive: Uterus absent.   Adnexa unremarkable. Other: No supplemental non-categorized findings. Musculoskeletal: Cutaneous thickening and adjacent subcutaneous stranding along the right lower quadrant anterior abdominal possibly from low-grade panniculitis. Grade 1 degenerative anterolisthesis at L4-5 with lumbar spondylosis and degenerative disc disease and likely mild multilevel impingement. Degenerative spurring of both hips. IMPRESSION: 1. New and enlarging tumors throughout the liver compatible with progressive malignancy. 2. New 2.0 by 3.1 cm soft tissue density between the stomach in the liver, probably a malignant deposit/lymph node. 3. Stable mild circumferential wall thickening in the distal esophagus. Prior gastric bypass. 4. Other imaging findings of potential clinical significance: Aortic Atherosclerosis (ICD10-I70.0). Coronary atherosclerosis. Mild cardiomegaly. Aortic and mitral valve calcifications. Mild increase in right middle lobe atelectasis without obvious tumor in the right middle lobe. Scarring or atelectasis in the lung bases. New trace left pleural effusion. Thoracolumbar spondylosis and degenerative disc disease. Non-specific 3 mm hypodense lesion in the spleen. Stable left kidney lower pole staghorn calculus, nonobstructive. Cutaneous thickening in the right lower quadrant anterior abdominal wall, possibly from low-grade panniculitis. Electronically Signed   By: Van Clines M.D.   On: 08/18/2018 12:42   Mr Jeri Cos YC Contrast  Result Date: 07/21/2018 CLINICAL DATA:  Memory loss, confusion, word-finding difficulty, and dizziness. History of esophageal cancer. EXAM: MRI HEAD WITHOUT AND WITH CONTRAST TECHNIQUE: Multiplanar, multiecho pulse sequences of the brain and surrounding structures were obtained without and with intravenous contrast. CONTRAST:  9 mL Gadavist COMPARISON:  Head CT 04/13/2018 FINDINGS: Brain: There is a 15 x 3 mm focus of abnormal trace diffusion signal and T2 hyperintensity in the  posterior left frontal white matter at the level of the centrum semiovale without reduced ADC which may reflect a subacute small vessel infarct. No acute infarct, intracranial hemorrhage, mass, midline shift, or extra-axial fluid collection is identified. Patchy T2 hyperintensities in the cerebral white matter bilaterally and in the pons are nonspecific but compatible with mild-to-moderate chronic small vessel ischemic disease. No abnormal enhancement is identified. There is mild cerebral atrophy. Vascular: Major intracranial vascular flow voids  are preserved. Skull and upper cervical spine: Unremarkable bone marrow signal. Partially imaged cervical spine fusion. Sinuses/Orbits: Bilateral cataract extraction. Paranasal sinuses and mastoid air cells are clear. Other: None. IMPRESSION: 1. Suspected small subacute white matter infarct in the posterior left frontal lobe. 2. No evidence of intracranial metastatic disease. 3. Mild-to-moderate chronic small vessel ischemic disease. Electronically Signed   By: Logan Bores M.D.   On: 07/21/2018 14:08   Ct Abdomen Pelvis W Contrast  Result Date: 08/18/2018 CLINICAL DATA:  Restaging of esophageal cancer metastatic to the liver. EXAM: CT CHEST, ABDOMEN, AND PELVIS WITH CONTRAST TECHNIQUE: Multidetector CT imaging of the chest, abdomen and pelvis was performed following the standard protocol during bolus administration of intravenous contrast. CONTRAST:  161m OMNIPAQUE IOHEXOL 300 MG/ML  SOLN COMPARISON:  Multiple exams, including 05/12/2018 FINDINGS: CT CHEST FINDINGS Cardiovascular: Right Port-A-Cath tip: SVC. Coronary, aortic arch, and branch vessel atherosclerotic vascular disease. Mild cardiomegaly. Aortic valve and mitral valve calcifications. Mediastinum/Nodes: Contrast medium and gas in the esophagus suggesting dysmotility or reflux. Persistent generally mild to moderate circumferential wall thickening of the distal esophagus. Lungs/Pleura: Increase in right middle  lobe atelectasis especially medially. Subsegmental atelectasis or scarring inferiorly in the right upper lobe. Scarring or atelectasis in the lingula and in the left lower lobe along the hemidiaphragm. New trace left pleural effusion. Musculoskeletal: Cervical fixators are noted. Thoracic spondylosis and mild thoracic kyphosis. Thoracic degenerative disc disease. CT ABDOMEN PELVIS FINDINGS Hepatobiliary: New and enlarging liver masses. A previous 1.4 by 1.2 cm lateral segment left hepatic lobe mass is currently solid and measures 5.3 by 4.9 cm. A previously cystic 2.5 by 2.8 cm right hepatic lobe lesion is currently cystic and solid, invades into the caudate lobe, and measures 12.7 by 8.6 cm. One of several new right hepatic lobe masses measures 4.9 by 3.7 cm on image 63/2. Gallbladder surgically absent. There is some mass effect on the right portal vein due to hepatic masses. No tumor thrombus is identified in the portal vein Pancreas: Unremarkable Spleen: Non-specific 3 mm hypodensity in the spleen on image 54/2, questionably present previously. Adrenals/Urinary Tract: Stable small hypodense right renal lesions are likely cysts but technically nonspecific due to small size. Stable 1.8 by 0.7 cm staghorn calculus in the left kidney lower pole. Urinary bladder unremarkable. Stomach/Bowel: Prior gastric bypass. Soft tissue density raise the possibility of tumor between the stomach and the liver measuring 2.0 by 3.1 cm on image 56/2. Vascular/Lymphatic: Aortoiliac atherosclerotic vascular disease. Reproductive: Uterus absent.  Adnexa unremarkable. Other: No supplemental non-categorized findings. Musculoskeletal: Cutaneous thickening and adjacent subcutaneous stranding along the right lower quadrant anterior abdominal possibly from low-grade panniculitis. Grade 1 degenerative anterolisthesis at L4-5 with lumbar spondylosis and degenerative disc disease and likely mild multilevel impingement. Degenerative spurring of  both hips. IMPRESSION: 1. New and enlarging tumors throughout the liver compatible with progressive malignancy. 2. New 2.0 by 3.1 cm soft tissue density between the stomach in the liver, probably a malignant deposit/lymph node. 3. Stable mild circumferential wall thickening in the distal esophagus. Prior gastric bypass. 4. Other imaging findings of potential clinical significance: Aortic Atherosclerosis (ICD10-I70.0). Coronary atherosclerosis. Mild cardiomegaly. Aortic and mitral valve calcifications. Mild increase in right middle lobe atelectasis without obvious tumor in the right middle lobe. Scarring or atelectasis in the lung bases. New trace left pleural effusion. Thoracolumbar spondylosis and degenerative disc disease. Non-specific 3 mm hypodense lesion in the spleen. Stable left kidney lower pole staghorn calculus, nonobstructive. Cutaneous thickening in the right lower quadrant  anterior abdominal wall, possibly from low-grade panniculitis. Electronically Signed   By: Van Clines M.D.   On: 08/18/2018 12:42    PERFORMANCE STATUS (ECOG) : 1 - Symptomatic but completely ambulatory  Review of Systems Unless otherwise noted, a complete review of systems is negative.  Physical Exam General: NAD, frail appearing, thin Pulmonary: unlabored Extremities: no edema Skin: no rashes Neurological: Weakness but otherwise nonfocal  IMPRESSION: Patient is known to me from her hospitalization in December 2019.  At that time I met with patient's son/HCPOA to discuss goals.  She has also been evaluated in the home by community-based palliative care.  I met with her today to establish palliative care services in the clinic.  Introduced palliative care and attempted to establish therapeutic rapport.  Patient also saw Dr. Tasia Catchings today.  Patient says she was informed of disease progression on the [facility] and the need to rotate to new treatment.  Patient says she wants to remain optimistic but recognizes that  she might be nearing a point where there are no viable treatment options left.  Overall, patient says she is coping reasonably well with her cancer.  She still lives at home independently but has a lot of help from her sons.  Symptomatically, patient denies any acute or distressing symptoms today.  We discussed advance care planning and I reviewed with her a MOST form, which patient requested to take home with her and think about prior to completion.  Patient was previously a DNR when she was last hospitalized.  PLAN: -Continue current scope of treatment -We will complete ACP/MOST Form when patient is next in the clinic -RTC in 2 to 3 weeks (patient will call to make the appointment when she schedules an appointment to see Dr. Tasia Catchings)   Patient expressed understanding and was in agreement with this plan. She also understands that She can call clinic at any time with any questions, concerns, or complaints.     Time Total: 45 minutes  Visit consisted of counseling and education dealing with the complex and emotionally intense issues of symptom management and palliative care in the setting of serious and potentially life-threatening illness.Greater than 50%  of this time was spent counseling and coordinating care related to the above assessment and plan.  Signed by: Altha Harm, PhD, NP-C [phone] (Work Cell)

## 2018-08-20 LAB — URINE CULTURE: Culture: NO GROWTH

## 2018-08-20 LAB — CEA: CEA: 2.3 ng/mL (ref 0.0–4.7)

## 2018-08-21 ENCOUNTER — Other Ambulatory Visit: Payer: Medicare Other

## 2018-08-21 ENCOUNTER — Ambulatory Visit: Payer: Medicare Other | Admitting: Oncology

## 2018-08-21 ENCOUNTER — Telehealth: Payer: Self-pay | Admitting: Pharmacist

## 2018-08-21 NOTE — Telephone Encounter (Signed)
Oral Chemotherapy Pharmacist Encounter  Faxed complete application for [facility] Patient Support in an effort to reduce patient's out of pocket expense for Lonsurf to $0.    Application completed and faxed to [phone]. Application sent without patient signature with [place] OUMNA-12 Assistance Application Signature Policy to prevent the patient from having to make a special trip to sign the application.  [employer] patient assistance phone number for follow up is [phone].   This encounter will be updated until final determination.   Darl Pikes, PharmD, BCPS, [facility] Hematology/Oncology Clinical Pharmacist [facility] [phone]  08/21/2018 12:50 PM

## 2018-08-21 NOTE — Telephone Encounter (Signed)
Oral Chemotherapy Pharmacist Encounter   Lonsurf copay: $200, patient will need to apply for manufacture assistance.  Darl Pikes, PharmD, BCPS, [facility] Hematology/Oncology Clinical Pharmacist [facility] [place] [phone]  08/21/2018 10:39 AM

## 2018-08-25 ENCOUNTER — Telehealth: Payer: Self-pay | Admitting: Student

## 2018-08-25 NOTE — Telephone Encounter (Signed)
Palliative NP called to f/u on patient. She reports feeling well except she is anxiously waiting on new treatment since cancer was worsening with Keytruda. She states she has called [facility] and is awaiting call. She denies any needs today. She is encouraged to call with questions.

## 2018-08-26 NOTE — Telephone Encounter (Addendum)
Oral Chemotherapy Pharmacist Encounter   Unfortunately, [facility] [employer] is still requiring the patient to sign the application and will not accept the [place] BZMCE-02 Assistance Application Signature Policy. I spoke a Freight forwarder at the program and he said they are currently working with [employer] to relax some of the patient consent/signature requirements.   I do not think this change will happen in a timely manner to benefit Ms. Mclelland, so I called Ms, Loden for her email address to try and complete this assistance process via email. I attempted to call Ms. Blankenburg on 08/25/18 for her email address but received a busy tone and was unable to leave a VM. I was able to reach her on 08/26/18. I gave the email to [facility] patient report. I asked Ms. Eicher to give me a call once this was completed or if she does not receive an email.  Darl Pikes, PharmD, BCPS, [facility] Hematology/Oncology Clinical Pharmacist [facility] [phone]  08/26/2018 9:14 AM

## 2018-08-27 ENCOUNTER — Telehealth: Payer: Self-pay | Admitting: Pharmacist

## 2018-08-27 NOTE — Telephone Encounter (Signed)
Oral Chemotherapy Pharmacist Encounter   [facility] [employer] denied Ms. Hardgrave's application for Lonsurf patient assistance due to income. Per Ms. Cutter she was $300 over their income cut off. [employer] has an assistance appeal process where patients can provide the program with documentation of their healthcare cost for reconsideration. Discussed this with Ms. Mcguirk and she would like to appeal, she will gather her documentation and bring to her next office visit on 4/22.  Ms. Livengood would like to go ahead and pay the $200 copay to fill the medication through [facility] while we await the appeal process. Coordinated medication fill and provided Lonsurf education (see telephone note from 08/27/18).  Darl Pikes, PharmD, BCPS, [facility] Hematology/Oncology Clinical Pharmacist [facility] [phone]  08/27/2018 4:41 PM

## 2018-08-27 NOTE — Telephone Encounter (Signed)
Oral Chemotherapy Pharmacist Encounter  [place] patient assistance through [place] currently being appealed. Patient wanted to go ahead with getting her first month through [place] with $200 copay. Medication will delivered on 08/31/2018. Dr. Tasia Catchings would like for her to get started when she receives the medication. I let pt know about the start plan, she stated her understanding.  Patient Education I spoke with patient for overview of new oral chemotherapy medication: Lonsurf (trifluridine and tipiracil) for the treatment of metastatic esophageal cancer, planned duration until disease progression or unacceptable drug toxicity. Patient has been previously treated with FOLFOX, Taxol and Cyramza, and Keytruda. Her is HER2 negative.  Counseled patient on administration, dosing, side effects, monitoring, drug-food interactions, safe handling, storage, and disposal. Patient will take 2 tablets by mouth two (2) times daily after a meal. Take with two 1m tablets. Take only on days 1-5, 8-12. Repeat every 28days.  Side effects include but not limited to: fatigue, N/V, diarrhea, decrease wbc/hgb/plt.    Reviewed with patient importance of keeping a medication schedule and plan for any missed doses.  [name] understanding and appreciation. All questions answered. Medication handout and medication calendar placed in the mail.  Provided [facility] with [facility]. Patient knows to call the office with questions or concerns. Oral Chemotherapy Navigation Clinic will continue to follow.  ADarl Pikes PharmD, BCPS, [facility] Clinical Pharmac[phone] 08/27/2018 5:18 PM

## 2018-08-28 MED FILL — LONSURF 20 MG-8.19 MG TAB: 20-8.19 | 28 days supply | Qty: 40 | Fill #0

## 2018-08-28 MED FILL — LONSURF 15 MG-6.14 MG TAB: 15-6.14 | 28 days supply | Qty: 40 | Fill #0

## 2018-08-31 ENCOUNTER — Other Ambulatory Visit: Payer: Self-pay | Admitting: Oncology

## 2018-08-31 ENCOUNTER — Other Ambulatory Visit: Payer: Self-pay | Admitting: Family Medicine

## 2018-08-31 ENCOUNTER — Ambulatory Visit (INDEPENDENT_AMBULATORY_CARE_PROVIDER_SITE_OTHER): Payer: Medicare Other | Admitting: Family Medicine

## 2018-08-31 DIAGNOSIS — R300 Dysuria: Secondary | ICD-10-CM

## 2018-08-31 DIAGNOSIS — N3000 Acute cystitis without hematuria: Secondary | ICD-10-CM

## 2018-08-31 LAB — POC URINALSYSI DIPSTICK (AUTOMATED)
Bilirubin, UA: NEGATIVE
Blood, UA: 25
Glucose, UA: NEGATIVE
Ketones, UA: NEGATIVE
Nitrite, UA: NEGATIVE
Protein, UA: NEGATIVE
Spec Grav, UA: 1.015 (ref 1.010–1.025)
Urobilinogen, UA: 0.2 E.U./dL
pH, UA: 7.5 (ref 5.0–8.0)

## 2018-08-31 MED ORDER — SULFAMETHOXAZOLE-TRIMETHOPRIM 400-80 MG PO TABS: 1.0000 | ORAL_TABLET | Freq: Two times a day (BID) | ORAL | 0 refills | Status: DC

## 2018-08-31 NOTE — Telephone Encounter (Signed)
Refill request

## 2018-08-31 NOTE — Progress Notes (Signed)
Virtual visit completed through WebEx or similar program Patient location: home  Provider location: [place] at [facility], office   Patient and son on call.    Limitations and rationale for visit method d/w patient.  Patient agreed to proceed.   CC: possible UTI.   HPI: recent change in chemo meds, now on lonsurf as of today.  Other sx predate start of that med.  She had confusion that was similar to prev UTI episodes.  She has some diffuse aches.  Recently used tramadol for pain, which she has been using at baseline.  No fevers known.  No vomiting, no diarrhea.  Some nausea yesterday.  Last BM was today.   Lower abd pain: mild, lower abd pain Dysuria: yes, as of a few days ago but not this AM.   She has been a little lightheaded recently.    No focal neuro changes.  Still ambulatory.  No obvious sign of CVA.    She had some episodes of panic with SOB and chest discomfort.  Intermittent sx, will self resolve.  It can happen on standing but that is the only time it happens.  No sx currently.   Meds and allergies reviewed.   ROS: Per HPI unless specifically indicated in ROS section   NAD Speech wnl  A/P: Presumed UTI.  Son to drop off urine sample. Start septra, update me as needed.  Routine cautions d/w pt and son.   Urine culture pending.  Her recent symptoms predate starting her new chemotherapy medication so this does not appear to be med related.  She has had a history of confusion related to UTI.  This is similar to previous.  Discussed with son and patient.  Given the pandemic it may be more hazardous for her to come to the clinic for further evaluation at this point.  She does not appear to need emergency room evaluation at this moment.  She has been episodically panicky, but that appears to happen upon standing.  She has support from her son at home.  She is also been a little bit lightheaded and these may correspond.  Discussed maintaining adequate fluid intake and  treating UTI as above both may contribute.  Son and patient agreed.  I will update oncology as FYI.  I appreciate the help of all involved.  >25 minutes spent in face to face time with patient, >50% spent in counselling or coordination of care.

## 2018-09-01 ENCOUNTER — Other Ambulatory Visit: Payer: Self-pay

## 2018-09-01 LAB — URINE CULTURE
MICRO NUMBER:: 407097
SPECIMEN QUALITY:: ADEQUATE

## 2018-09-01 NOTE — Telephone Encounter (Signed)
oxybutynin (DITROPAN) 5 MG tablet Have not been prescribed by Dr Damita Dunnings.  furosemide (LASIX) 20 mg Last prescribed on 05/12/2018  pregabalin (LYRICA) 150 mg Last prescribed on 08/04/2018    Last office visit on 08/31/2018 . No future appointment

## 2018-09-01 NOTE — Assessment & Plan Note (Signed)
Presumed UTI/cystitis Son to drop off urine sample. Start septra, update me as needed.  Routine cautions d/w pt and son.   Urine culture pending.  Her recent symptoms predate starting her new chemotherapy medication so this does not appear to be med related.  She has had a history of confusion related to UTI.  This is similar to previous.  Discussed with son and patient.  Given the pandemic it may be more hazardous for her to come to the clinic for further evaluation at this point.  She does not appear to need emergency room evaluation at this moment.  She has been episodically panicky, but that appears to happen upon standing.  She has support from her son at home.  She is also been a little bit lightheaded and these may correspond.  Discussed maintaining adequate fluid intake and treating UTI as above both may contribute.  Son and patient agreed.  I will update oncology as FYI.  I appreciate the help of all involved.  >25 minutes spent in face to face time with patient, >50% spent in counselling or coordination of care.

## 2018-09-02 ENCOUNTER — Other Ambulatory Visit: Payer: Self-pay

## 2018-09-02 ENCOUNTER — Encounter: Payer: Self-pay | Admitting: Oncology

## 2018-09-02 ENCOUNTER — Inpatient Hospital Stay: Payer: Medicare Other | Admitting: Oncology

## 2018-09-02 DIAGNOSIS — Z515 Encounter for palliative care: Secondary | ICD-10-CM

## 2018-09-02 MED ORDER — DEXAMETHASONE 4 MG PO TABS: 2.0000 mg | ORAL_TABLET | Freq: Every day | ORAL | 0 refills | Status: DC

## 2018-09-02 NOTE — Progress Notes (Addendum)
[name]  [phone] [phone]   Name: [name] Date: 09/15/2018 MRN: [id number]  DOB: Feb 02, 1935  Patient Care Team: Tonia Ghent, MD as PCP - General (Family Medicine)    REASON FOR CONSULTATION: Palliative Care consult requested for this 84 y.o. female with multiple medical problems including stage IV which for esophageal cancer stage IV esophageal cancer status post chemotherapy, and immunotherapy and XRT with disease progression most recently noted with Keytruda.  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  Patient has recurrent UTIs and was last hospitalized 04/12/2018 to 04/14/2018 with same.  Palliative care was consulted to help address goals.    SOCIAL HISTORY:     reports that she quit smoking about 43 years ago. Her smoking use included cigarettes. She has a 20.00 pack-year smoking history. She has never used smokeless tobacco. She reports that she does not drink alcohol or use drugs.  ADVANCE DIRECTIVES:  Patient son, Yvone Neu is her healthcare power of attorney.  CODE STATUS: DNR  PAST MEDICAL HISTORY: Past Medical History:  Diagnosis Date   Arthritis    Asthma    COPD (chronic obstructive pulmonary disease) (HCC)    Dysphonia    Dyspnea    Esophageal cancer (HCC)    GERD (gastroesophageal reflux disease)    Heart murmur    History of kidney stones    Hypertension    Hypokalemia 10/07/2017   Melanoma (HCC)    OAB (overactive bladder)    OSA on CPAP    Paresthesia    from neck down after spinal abscess   Personal history of chemotherapy    esophageal cancer   Personal history of radiation therapy    esophageal cancer   Pupil asymmetry    R pupil defect   Skin cancer    Sleep apnea    Spinal cord abscess     PAST SURGICAL HISTORY:  Past Surgical History:    Procedure Laterality Date   ABDOMINAL HYSTERECTOMY     ANTERIOR CERVICAL DECOMP/DISCECTOMY FUSION N/A 07/16/2013   Procedure: ANTERIOR CERVICAL DECOMPRESSION/DISCECTOMY FUSION mutlipleLEVELS C4-7;  Surgeon: Erline Levine, MD;  Location: [facility];  Service: Neurosurgery;  Laterality: N/A;   APPENDECTOMY     carpel tunn Bilateral    CATARACT EXTRACTION     CATARACT EXTRACTION, BILATERAL     CHOLECYSTECTOMY     dental implant     ESOPHAGOGASTRODUODENOSCOPY Left 03/21/2017   Procedure: ESOPHAGOGASTRODUODENOSCOPY (EGD);  Surgeon: Virgel Manifold, MD;  Location: [facility] ENDOSCOPY;  Service: Endoscopy;  Laterality: Left;   ESOPHAGOGASTRODUODENOSCOPY (EGD) WITH PROPOFOL N/A 08/20/2017   Procedure: ESOPHAGOGASTRODUODENOSCOPY (EGD) WITH PROPOFOL;  Surgeon: Virgel Manifold, MD;  Location: [facility] ENDOSCOPY;  Service: Endoscopy;  Laterality: N/A;   ESOPHAGOGASTRODUODENOSCOPY (EGD) WITH PROPOFOL N/A 09/19/2017   Procedure: ESOPHAGOGASTRODUODENOSCOPY (EGD) WITH PROPOFOL;  Surgeon: Virgel Manifold, MD;  Location: [facility] ENDOSCOPY;  Service: Endoscopy;  Laterality: N/A;   ESOPHAGOGASTRODUODENOSCOPY (EGD) WITH PROPOFOL N/A 10/03/2017   Procedure: ESOPHAGOGASTRODUODENOSCOPY (EGD) WITH PROPOFOL;  Surgeon: Virgel Manifold, MD;  Location: [facility] ENDOSCOPY;  Service: Endoscopy;  Laterality: N/A;   ESOPHAGOGASTRODUODENOSCOPY (EGD) WITH PROPOFOL N/A 10/22/2017   Procedure: ESOPHAGOGASTRODUODENOSCOPY (EGD) WITH PROPOFOL;  Surgeon: Virgel Manifold, MD;  Location: [facility] ENDOSCOPY;  Service: Endoscopy;  Laterality: N/A;   ESOPHAGOGASTRODUODENOSCOPY (EGD) WITH PROPOFOL N/A 11/18/2017   Procedure: ESOPHAGOGASTRODUODENOSCOPY (EGD) WITH PROPOFOL;  Surgeon:  Virgel Manifold, MD;  Location: [facility] ENDOSCOPY;  Service: Endoscopy;  Laterality: N/A;   ESOPHAGOGASTRODUODENOSCOPY (EGD) WITH PROPOFOL N/A 12/17/2017   Procedure: ESOPHAGOGASTRODUODENOSCOPY (EGD) WITH PROPOFOL;  Surgeon: Virgel Manifold,  MD;  Location: [facility] ENDOSCOPY;  Service: Endoscopy;  Laterality: N/A;   ESOPHAGOGASTRODUODENOSCOPY (EGD) WITH PROPOFOL N/A 01/08/2018   Procedure: ESOPHAGOGASTRODUODENOSCOPY (EGD) WITH PROPOFOL;  Surgeon: Virgel Manifold, MD;  Location: [facility] ENDOSCOPY;  Service: Endoscopy;  Laterality: N/A;   ESOPHAGOGASTRODUODENOSCOPY (EGD) WITH PROPOFOL N/A 01/22/2018   Procedure: ESOPHAGOGASTRODUODENOSCOPY (EGD) WITH PROPOFOL;  Surgeon: Virgel Manifold, MD;  Location: [facility] ENDOSCOPY;  Service: Endoscopy;  Laterality: N/A;   ESOPHAGOGASTRODUODENOSCOPY (EGD) WITH PROPOFOL N/A 07/15/2018   Procedure: ESOPHAGOGASTRODUODENOSCOPY (EGD) WITH PROPOFOL;  Surgeon: Virgel Manifold, MD;  Location: [facility] ENDOSCOPY;  Service: Endoscopy;  Laterality: N/A;   EYE SURGERY     GASTRIC BYPASS     HERNIA REPAIR     HIATAL HERNIA REPAIR     JOINT REPLACEMENT Bilateral    knee   MELANOMA EXCISION     PORTA CATH INSERTION N/A 04/07/2017   Procedure: PORTA CATH INSERTION;  Surgeon: Algernon Huxley, MD;  Location: [place] [facility];  Service: Cardiovascular;  Laterality: N/A;   POSTERIOR CERVICAL FUSION/FORAMINOTOMY N/A 07/28/2013   Procedure: Cervical four-seven  posterior cervical fusion;  Surgeon: Erline Levine, MD;  Location: [place] [facility];  Service: Neurosurgery;  Laterality: N/A;   TONSILLECTOMY     TOTAL KNEE ARTHROPLASTY      HEMATOLOGY/ONCOLOGY HISTORY:  Oncology History   Patient is a 84 year old female who has metastatic esophageal cancer with liver involvement.  Weight loss 20 pounds for the past year prior to diagnosis.  #EGD during admission showed partially obstructive esophageal mass at the distal part of esophagus. Biopsy was taken. Pathology showed esophageal adenocarcinoma. #US biopsy of liver lesion to proof distant metastasis.  Report family history of breast cancer,but she has been having routine mammograms.  # Lives alone.She's a former smoker.  # Molecular Testing:    #[employer] Cdx: No reportable alternations with companion diagnostic (CDx) claims.  MS stable Tumor mutation burden: Cannot be determined, CCND3 amplification: Equivocal.  Amended reports on 06/04/2017, Tumor mutational burden changed from can not be determined to "TMB -low" on the re-analyzed samples.  # HER 2 negative.   Current Treatment S/p 6 cycles of FOLFOX, PET scan showed excellent partial response from both chemotherapy and radiation, result was discussed with patient.  currently on 5-FU maintenance.  S/p palliative radiation in January 2019 # Developed esophageal stricture secondary to radiation in April and got dilation via endoscopy. EGD shoed Benign-appearing esophageal stenosis. This is a result of radiation therapy.- LA Grade C radiation esophagitis. - The previously noted esophageal mass in Nov 2018 was much decreased indicating good response to radiation/chemotherapy. - Normal stomach.- Normal examined jejunum.- No specimens collected.        Malignant neoplasm of lower third of esophagus (HCC)   03/29/2017 Initial Diagnosis    Malignant neoplasm of lower third of esophagus ([place])    01/27/2018 - 05/24/2018 Chemotherapy    The patient had PACLitaxel (TAXOL) 162 mg in sodium chloride 0.9 % 250 mL chemo infusion (</= 28m/m2), 80 mg/m2 = 162 mg, Intravenous,  Once, 4 of 7 cycles Dose modification: 65 mg/m2 (original dose 80 mg/m2, Cycle 2, Reason: Provider Judgment), 70 mg/m2 (original dose 80 mg/m2, Cycle 4, Reason: Provider Judgment), 70 mg/m2 (original dose 80 mg/m2, Cycle 4, Reason: Provider Judgment) Administration: 162 mg (01/27/2018), 162  mg (03/02/2018), 132 mg (03/09/2018), 162 mg (02/03/2018), 162 mg (02/09/2018), 162 mg (02/23/2018), 162 mg (03/23/2018), 162 mg (03/30/2018), 162 mg (04/06/2018), 138 mg (04/27/2018), 138 mg (05/04/2018), 138 mg (05/11/2018) ramucirumab (CYRAMZA) 700 mg in sodium chloride 0.9 % 180 mL chemo infusion, 8 mg/kg = 700 mg, Intravenous, Once,  4 of 7 cycles Administration: 700 mg (01/27/2018), 700 mg (03/09/2018), 700 mg (02/09/2018), 700 mg (02/23/2018), 700 mg (03/23/2018), 700 mg (04/06/2018), 700 mg (04/27/2018), 700 mg (05/11/2018)  for chemotherapy treatment.     08/24/2018 -  Chemotherapy    The patient had [No matching medication found in this treatment plan]  for chemotherapy treatment.      Esophageal adenocarcinoma ([place])    Initial Diagnosis    Esophageal adenocarcinoma ([place])    05/27/2018 - 08/18/2018 Chemotherapy    The patient had pembrolizumab (KEYTRUDA) 200 mg in sodium chloride 0.9 % 50 mL chemo infusion, 200 mg, Intravenous, Once, 4 of 5 cycles Administration: 200 mg (05/27/2018), 200 mg (06/17/2018), 200 mg (07/08/2018), 200 mg (07/29/2018)  for chemotherapy treatment.     08/24/2018 -  Chemotherapy    The patient had [No matching medication found in this treatment plan]  for chemotherapy treatment.      ALLERGIES:  is allergic to cefuroxime; codeine; doxycycline; gabapentin; iohexol; other; oxycodone; quinolones; and synvisc [hylan g-f 20].  MEDICATIONS:  Current Outpatient Medications  Medication Sig Dispense Refill   albuterol (PROVENTIL HFA;VENTOLIN HFA) 108 (90 Base) MCG/ACT inhaler Inhale 2 puffs into the lungs every 4 (four) hours as needed for wheezing or shortness of breath.     aspirin EC 81 MG tablet Take 1 tablet (81 mg total) by mouth every other day.     calcium carbonate (TUMS EX) 750 MG chewable tablet Chew 1 tablet by mouth as needed for heartburn.      chlorhexidine (PERIDEX) 0.12 % solution Use as directed 15 mLs in the mouth or throat 2 (two) times daily.     Cholecalciferol (VITAMIN D) 2000 UNITS CAPS Take 2,000 Units by mouth daily.     dexamethasone (DECADRON) 4 MG tablet Take 0.5 tablets (2 mg total) by mouth daily. 30 tablet 0   Diclofenac Sodium 1 % CREA Apply to left arm as needed for pain three times daily 120 g 0   dronabinol (MARINOL) 5 MG capsule TAKE 1 CAPSULE TWICE DAILY  BEFORE A MEAL 60 capsule 1   fluticasone (FLONASE) 50 MCG/ACT nasal spray Place 1-2 sprays into both nostrils daily. 16 g 5   furosemide (LASIX) 20 MG tablet TAKE 1-2 TABLETS BY MOUTH DAILY AS NEEDED FOR EDEMA 180 tablet 1   hydrocortisone cream 1 % Apply 1 application topically daily as needed for itching.      loratadine (CLARITIN) 10 MG tablet Take 10 mg by mouth daily.     Multiple Vitamins-Minerals (MULTIVITAMIN ADULT) CHEW Chew 1 tablet by mouth daily.      MYRBETRIQ 25 MG TB24 tablet      oxybutynin (DITROPAN) 5 MG tablet TAKE ONE TABLET BY MOUTH EVERY MORNING AND TAKE ONE-HALF TABLET AT BEDTIME 45 tablet 3   pantoprazole (PROTONIX) 40 MG tablet TAKE 1 TABLET BY MOUTH TWICE DAILY BEFORE A MEAL 60 tablet 3   Polyethyl Glycol-Propyl Glycol (SYSTANE OP) Apply 1 drop to eye daily as needed (dry eyes).     potassium chloride (KLOR-CON) 20 MEQ packet Take 20 mEq by mouth daily. 30 packet 1   pregabalin (LYRICA) 150 MG capsule TAKE 1 CAPSULE TWICE  DAILY 60 capsule 3   pyridOXINE (VITAMIN B-6) 100 MG tablet Take 1 tablet (100 mg total) by mouth daily. 30 tablet 3   SENNA-PLUS 8.6-50 MG tablet TAKE 2 TABLETS BY MOUTH DAILY 60 tablet 0   sulfamethoxazole-trimethoprim (BACTRIM) 400-80 MG tablet Take 1 tablet by mouth 2 (two) times daily. 10 tablet 0   traMADol (ULTRAM) 50 MG tablet Take 1 tablet (50 mg total) by mouth every 6 (six) hours as needed. 60 tablet 0   traZODone (DESYREL) 50 MG tablet Take 0.5-1 tablets (25-50 mg total) by mouth at bedtime as needed for sleep.     trifluridine-tipiracil (LONSURF) 15-6.14 MG tablet Take 2 tablets by mouth two (2) times daily after a meal. Take with two 43m tablets. Take only on days 1-5, 8-12. Repeat every 28days. 40 tablet 3   trifluridine-tipiracil (LONSURF) 20-8.19 MG tablet Take 2 tablets by mouth two (2) times daily after a meal. Take with two 130mtablets. Take only on days 1-5, 8-12. Repeat every 28days. 40 tablet 3   No current  facility-administered medications for this visit.    Facility-Administered Medications Ordered in Other Visits  Medication Dose Route Frequency Provider Last Rate Last Dose   sodium chloride flush (NS) 0.9 % injection 10 mL  10 mL Intravenous PRN YuEarlie ServerMD   10 mL at 01/26/18 0904    VITAL SIGNS: There were no vitals taken for this visit. There were no vitals filed for this visit.  Estimated body mass index is 35.36 kg/m as calculated from the following:   Height as of 07/24/18: '5\' 2"'  (1.575 m).   Weight as of 08/19/18: 193 lb 4.8 oz (87.7 kg).  LABS: CBC:    Component Value Date/Time   WBC 7.8 09/03/2018 1338   HGB 8.0 (L) 09/03/2018 1338   HGB 12.9 07/13/2013 0609   HCT 26.8 (L) 09/03/2018 1338   HCT 38.8 07/13/2013 0609   PLT 282 09/03/2018 1338   PLT 212 07/13/2013 0609   MCV 94.4 09/03/2018 1338   MCV 93 07/13/2013 0609   NEUTROABS 6.1 09/03/2018 1338   LYMPHSABS 0.7 09/03/2018 1338   MONOABS 0.8 09/03/2018 1338   EOSABS 0.2 09/03/2018 1338   BASOSABS 0.0 09/03/2018 1338   Comprehensive Metabolic Panel:    Component Value Date/Time   NA 135 09/03/2018 1338   NA 138 07/13/2013 0609   K 4.6 09/03/2018 1338   K 4.1 07/13/2013 0609   CL 97 (L) 09/03/2018 1338   CL 104 07/13/2013 0609   CO2 30 09/03/2018 1338   CO2 29 07/13/2013 0609   BUN 21 09/03/2018 1338   BUN 15 07/13/2013 0609   CREATININE 0.85 09/03/2018 1338   CREATININE 0.67 07/13/2013 0609   GLUCOSE 99 09/03/2018 1338   GLUCOSE 115 (H) 07/13/2013 0609   CALCIUM 8.5 (L) 09/03/2018 1338   CALCIUM 8.3 (L) 07/13/2013 0609   AST 45 (H) 09/03/2018 1338   ALT 18 09/03/2018 1338   ALKPHOS 156 (H) 09/03/2018 1338   BILITOT 0.6 09/03/2018 1338   PROT 6.9 09/03/2018 1338   ALBUMIN 2.8 (L) 09/03/2018 1338    RADIOGRAPHIC STUDIES: Ct Chest W Contrast  Result Date: 08/18/2018 CLINICAL DATA:  Restaging of esophageal cancer metastatic to the liver. EXAM: CT CHEST, ABDOMEN, AND PELVIS WITH CONTRAST TECHNIQUE:  Multidetector CT imaging of the chest, abdomen and pelvis was performed following the standard protocol during bolus administration of intravenous contrast. CONTRAST:  10066mMNIPAQUE IOHEXOL 300 MG/ML  SOLN COMPARISON:  Multiple  exams, including 05/12/2018 FINDINGS: CT CHEST FINDINGS Cardiovascular: Right Port-A-Cath tip: SVC. Coronary, aortic arch, and branch vessel atherosclerotic vascular disease. Mild cardiomegaly. Aortic valve and mitral valve calcifications. Mediastinum/Nodes: Contrast medium and gas in the esophagus suggesting dysmotility or reflux. Persistent generally mild to moderate circumferential wall thickening of the distal esophagus. Lungs/Pleura: Increase in right middle lobe atelectasis especially medially. Subsegmental atelectasis or scarring inferiorly in the right upper lobe. Scarring or atelectasis in the lingula and in the left lower lobe along the hemidiaphragm. New trace left pleural effusion. Musculoskeletal: Cervical fixators are noted. Thoracic spondylosis and mild thoracic kyphosis. Thoracic degenerative disc disease. CT ABDOMEN PELVIS FINDINGS Hepatobiliary: New and enlarging liver masses. A previous 1.4 by 1.2 cm lateral segment left hepatic lobe mass is currently solid and measures 5.3 by 4.9 cm. A previously cystic 2.5 by 2.8 cm right hepatic lobe lesion is currently cystic and solid, invades into the caudate lobe, and measures 12.7 by 8.6 cm. One of several new right hepatic lobe masses measures 4.9 by 3.7 cm on image 63/2. Gallbladder surgically absent. There is some mass effect on the right portal vein due to hepatic masses. No tumor thrombus is identified in the portal vein Pancreas: Unremarkable Spleen: Non-specific 3 mm hypodensity in the spleen on image 54/2, questionably present previously. Adrenals/Urinary Tract: Stable small hypodense right renal lesions are likely cysts but technically nonspecific due to small size. Stable 1.8 by 0.7 cm staghorn calculus in the left  kidney lower pole. Urinary bladder unremarkable. Stomach/Bowel: Prior gastric bypass. Soft tissue density raise the possibility of tumor between the stomach and the liver measuring 2.0 by 3.1 cm on image 56/2. Vascular/Lymphatic: Aortoiliac atherosclerotic vascular disease. Reproductive: Uterus absent.  Adnexa unremarkable. Other: No supplemental non-categorized findings. Musculoskeletal: Cutaneous thickening and adjacent subcutaneous stranding along the right lower quadrant anterior abdominal possibly from low-grade panniculitis. Grade 1 degenerative anterolisthesis at L4-5 with lumbar spondylosis and degenerative disc disease and likely mild multilevel impingement. Degenerative spurring of both hips. IMPRESSION: 1. New and enlarging tumors throughout the liver compatible with progressive malignancy. 2. New 2.0 by 3.1 cm soft tissue density between the stomach in the liver, probably a malignant deposit/lymph node. 3. Stable mild circumferential wall thickening in the distal esophagus. Prior gastric bypass. 4. Other imaging findings of potential clinical significance: Aortic Atherosclerosis (ICD10-I70.0). Coronary atherosclerosis. Mild cardiomegaly. Aortic and mitral valve calcifications. Mild increase in right middle lobe atelectasis without obvious tumor in the right middle lobe. Scarring or atelectasis in the lung bases. New trace left pleural effusion. Thoracolumbar spondylosis and degenerative disc disease. Non-specific 3 mm hypodense lesion in the spleen. Stable left kidney lower pole staghorn calculus, nonobstructive. Cutaneous thickening in the right lower quadrant anterior abdominal wall, possibly from low-grade panniculitis. Electronically Signed   By: Van Clines M.D.   On: 08/18/2018 12:42   Ct Abdomen Pelvis W Contrast  Result Date: 08/18/2018 CLINICAL DATA:  Restaging of esophageal cancer metastatic to the liver. EXAM: CT CHEST, ABDOMEN, AND PELVIS WITH CONTRAST TECHNIQUE: Multidetector CT  imaging of the chest, abdomen and pelvis was performed following the standard protocol during bolus administration of intravenous contrast. CONTRAST:  16m OMNIPAQUE IOHEXOL 300 MG/ML  SOLN COMPARISON:  Multiple exams, including 05/12/2018 FINDINGS: CT CHEST FINDINGS Cardiovascular: Right Port-A-Cath tip: SVC. Coronary, aortic arch, and branch vessel atherosclerotic vascular disease. Mild cardiomegaly. Aortic valve and mitral valve calcifications. Mediastinum/Nodes: Contrast medium and gas in the esophagus suggesting dysmotility or reflux. Persistent generally mild to moderate circumferential wall thickening of the  distal esophagus. Lungs/Pleura: Increase in right middle lobe atelectasis especially medially. Subsegmental atelectasis or scarring inferiorly in the right upper lobe. Scarring or atelectasis in the lingula and in the left lower lobe along the hemidiaphragm. New trace left pleural effusion. Musculoskeletal: Cervical fixators are noted. Thoracic spondylosis and mild thoracic kyphosis. Thoracic degenerative disc disease. CT ABDOMEN PELVIS FINDINGS Hepatobiliary: New and enlarging liver masses. A previous 1.4 by 1.2 cm lateral segment left hepatic lobe mass is currently solid and measures 5.3 by 4.9 cm. A previously cystic 2.5 by 2.8 cm right hepatic lobe lesion is currently cystic and solid, invades into the caudate lobe, and measures 12.7 by 8.6 cm. One of several new right hepatic lobe masses measures 4.9 by 3.7 cm on image 63/2. Gallbladder surgically absent. There is some mass effect on the right portal vein due to hepatic masses. No tumor thrombus is identified in the portal vein Pancreas: Unremarkable Spleen: Non-specific 3 mm hypodensity in the spleen on image 54/2, questionably present previously. Adrenals/Urinary Tract: Stable small hypodense right renal lesions are likely cysts but technically nonspecific due to small size. Stable 1.8 by 0.7 cm staghorn calculus in the left kidney lower pole.  Urinary bladder unremarkable. Stomach/Bowel: Prior gastric bypass. Soft tissue density raise the possibility of tumor between the stomach and the liver measuring 2.0 by 3.1 cm on image 56/2. Vascular/Lymphatic: Aortoiliac atherosclerotic vascular disease. Reproductive: Uterus absent.  Adnexa unremarkable. Other: No supplemental non-categorized findings. Musculoskeletal: Cutaneous thickening and adjacent subcutaneous stranding along the right lower quadrant anterior abdominal possibly from low-grade panniculitis. Grade 1 degenerative anterolisthesis at L4-5 with lumbar spondylosis and degenerative disc disease and likely mild multilevel impingement. Degenerative spurring of both hips. IMPRESSION: 1. New and enlarging tumors throughout the liver compatible with progressive malignancy. 2. New 2.0 by 3.1 cm soft tissue density between the stomach in the liver, probably a malignant deposit/lymph node. 3. Stable mild circumferential wall thickening in the distal esophagus. Prior gastric bypass. 4. Other imaging findings of potential clinical significance: Aortic Atherosclerosis (ICD10-I70.0). Coronary atherosclerosis. Mild cardiomegaly. Aortic and mitral valve calcifications. Mild increase in right middle lobe atelectasis without obvious tumor in the right middle lobe. Scarring or atelectasis in the lung bases. New trace left pleural effusion. Thoracolumbar spondylosis and degenerative disc disease. Non-specific 3 mm hypodense lesion in the spleen. Stable left kidney lower pole staghorn calculus, nonobstructive. Cutaneous thickening in the right lower quadrant anterior abdominal wall, possibly from low-grade panniculitis. Electronically Signed   By: Van Clines M.D.   On: 08/18/2018 12:42    PERFORMANCE STATUS (ECOG) : 1 - Symptomatic but completely ambulatory  Review of Systems Unless otherwise noted, a complete review of systems is negative.  Physical Exam General: NAD, frail appearing,  thin Cardiovascular: regular rate and rhythm Pulmonary: clear ant fields Abdomen: soft, nontender, + bowel sounds GU: no suprapubic tenderness Extremities: no edema, no joint deformities Skin: no rashes Neurological: Weakness but otherwise nonfocal  IMPRESSION: This is my first encounter with this patient.  She was recently seen by Billey Chang, NP on 08/19/2018 for goals of care discussion.  [facility]. CT imaging from 08/18/18 revealed progression of disease with new and enlarging tumor throughout the liver and new soft tissue densities between the stomach and liver.  Beryle Flock was discontinued.   Symptomatically, she admits to some pain.  States it is controlled with PRN Tylenol and tramadol 50 mg every 6 hours.  Has some exertional  dyspnea.  Uses albuterol inhaler as needed.  Has bilateral leg swelling which is often relieved with the use of furosemide 20 mg 1 to 2 tablets daily and frequent leg elevation.  Admits to general malaise/fatigue and little to no appetite.  She has no energy and quality of life is poor.  She has previously been prescribed Marinol which she does not think is helping her appetite. Previously evalutaed by dietician.   Continue Lonsurf.  Started on 08/31/2018.  Tolerating well so far.  Has some financial concerns in affording medication.  Thayer Dallas, oral chemotherapy pharmacist to get her assistance.  PLAN: Will start Decadron 2 mg daily to see if this helps with fatigue and appetite. Will complete ACP/most form at next clinic visit. Schedule 2-3 weeks out.  Continue Lonsurf. Patient provided financial documents today.  I have placed those on [employer] for review.  Patient expressed understanding and was in agreement with this plan. She also understands that She can call the clinic at any time with any questions, concerns, or complaints.   Time Total: 30  Visit consisted  of counseling and education dealing with the complex and emotionally intense issues of symptom management and palliative care in the setting of serious and potentially life-threatening illness.Greater than 50%  of this time was spent counseling and coordinating care related to the above assessment and plan.  Signed by: Faythe Casa, NP Work Cell [phone]

## 2018-09-02 NOTE — Telephone Encounter (Signed)
rxs sent.  Thanks.

## 2018-09-03 ENCOUNTER — Inpatient Hospital Stay: Payer: Medicare Other

## 2018-09-03 ENCOUNTER — Other Ambulatory Visit: Payer: Self-pay

## 2018-09-03 DIAGNOSIS — C155 Malignant neoplasm of lower third of esophagus: Secondary | ICD-10-CM | POA: Diagnosis not present

## 2018-09-03 DIAGNOSIS — Z5111 Encounter for antineoplastic chemotherapy: Secondary | ICD-10-CM

## 2018-09-03 DIAGNOSIS — C159 Malignant neoplasm of esophagus, unspecified: Secondary | ICD-10-CM

## 2018-09-03 LAB — CBC WITH DIFFERENTIAL/PLATELET
Abs Immature Granulocytes: 0.08 10*3/uL — ABNORMAL HIGH (ref 0.00–0.07)
Basophils Absolute: 0 10*3/uL (ref 0.0–0.1)
Basophils Relative: 0 %
Eosinophils Absolute: 0.2 10*3/uL (ref 0.0–0.5)
Eosinophils Relative: 2 %
HCT: 26.8 % — ABNORMAL LOW (ref 36.0–46.0)
Hemoglobin: 8 g/dL — ABNORMAL LOW (ref 12.0–15.0)
Immature Granulocytes: 1 %
Lymphocytes Relative: 9 %
Lymphs Abs: 0.7 10*3/uL (ref 0.7–4.0)
MCH: 28.2 pg (ref 26.0–34.0)
MCHC: 29.9 g/dL — ABNORMAL LOW (ref 30.0–36.0)
MCV: 94.4 fL (ref 80.0–100.0)
Monocytes Absolute: 0.8 10*3/uL (ref 0.1–1.0)
Monocytes Relative: 10 %
Neutro Abs: 6.1 10*3/uL (ref 1.7–7.7)
Neutrophils Relative %: 78 %
Platelets: 282 10*3/uL (ref 150–400)
RBC: 2.84 MIL/uL — ABNORMAL LOW (ref 3.87–5.11)
RDW: 17.7 % — ABNORMAL HIGH (ref 11.5–15.5)
WBC: 7.8 10*3/uL (ref 4.0–10.5)
nRBC: 0 % (ref 0.0–0.2)

## 2018-09-03 LAB — COMPREHENSIVE METABOLIC PANEL
ALT: 18 U/L (ref 0–44)
AST: 45 U/L — ABNORMAL HIGH (ref 15–41)
Albumin: 2.8 g/dL — ABNORMAL LOW (ref 3.5–5.0)
Alkaline Phosphatase: 156 U/L — ABNORMAL HIGH (ref 38–126)
Anion gap: 8 (ref 5–15)
BUN: 21 mg/dL (ref 8–23)
CO2: 30 mmol/L (ref 22–32)
Calcium: 8.5 mg/dL — ABNORMAL LOW (ref 8.9–10.3)
Chloride: 97 mmol/L — ABNORMAL LOW (ref 98–111)
Creatinine, Ser: 0.85 mg/dL (ref 0.44–1.00)
GFR calc Af Amer: >60 mL/min (ref >60)
GFR calc non Af Amer: >60 mL/min (ref >60)
Glucose, Bld: 99 mg/dL (ref 70–99)
Potassium: 4.6 mmol/L (ref 3.5–5.1)
Sodium: 135 mmol/L (ref 135–145)
Total Bilirubin: 0.6 mg/dL (ref 0.3–1.2)
Total Protein: 6.9 g/dL (ref 6.5–8.1)

## 2018-09-04 ENCOUNTER — Other Ambulatory Visit: Payer: Self-pay

## 2018-09-04 ENCOUNTER — Inpatient Hospital Stay (HOSPITAL_BASED_OUTPATIENT_CLINIC_OR_DEPARTMENT_OTHER): Payer: Medicare Other | Admitting: Oncology

## 2018-09-04 ENCOUNTER — Encounter: Payer: Self-pay | Admitting: Oncology

## 2018-09-04 DIAGNOSIS — Z95828 Presence of other vascular implants and grafts: Secondary | ICD-10-CM

## 2018-09-04 DIAGNOSIS — Z5111 Encounter for antineoplastic chemotherapy: Secondary | ICD-10-CM

## 2018-09-04 DIAGNOSIS — C159 Malignant neoplasm of esophagus, unspecified: Secondary | ICD-10-CM

## 2018-09-04 DIAGNOSIS — R634 Abnormal weight loss: Secondary | ICD-10-CM

## 2018-09-04 DIAGNOSIS — T451X5A Adverse effect of antineoplastic and immunosuppressive drugs, initial encounter: Secondary | ICD-10-CM | POA: Diagnosis not present

## 2018-09-04 DIAGNOSIS — D6481 Anemia due to antineoplastic chemotherapy: Secondary | ICD-10-CM

## 2018-09-04 NOTE — Progress Notes (Signed)
Patient via phone for webex visit. Pt continues to be on antibiotic for UTI. Pt on lunsuf, no adverse reactions.

## 2018-09-04 NOTE — Telephone Encounter (Signed)
Oral Chemotherapy Pharmacist Encounter   Patient brought in documents to show her medical expenses to her appt on 4/22. On 4/23, I made copies of the neccessary documents and faxed them to [facility] for consideration of patient assistance. I placed the originals in the mail to be sent back to Ms. Moree.   Will continue to follow to process with [facility].   Darl Pikes, PharmD, BCPS, [facility] Hematology/Oncology Clinical Pharmacist [facility] [phone]  09/04/2018 10:46 AM

## 2018-09-06 NOTE — Progress Notes (Signed)
HEMATOLOGY-ONCOLOGY TeleHEALTH VISIT PROGRESS NOTE  I connected with [name] on 09/06/18 at  1:45 PM EDT by video enabled telemedicine visit and verified that I am speaking with the correct person using two identifiers. I discussed the limitations, risks, security and privacy concerns of performing an evaluation and management service by telemedicine and the availability of in-person appointments. I also discussed with the patient that there may be a patient responsible charge related to this service. The patient expressed understanding and agreed to proceed.   Other persons participating in the visit and their role in the encounter:  [name], [name], check in patient   [name] Merl, RN, check in patient.   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 female who has above history reviewed by me today presents for follow up visit for management of evaluation of chemotherapy tolerability for treatment of metastatic esophageal cancer.  Reports tolerating Longsurf [startred on 08/31/2018] well, denies any nausea vomiting, diarrhea.  She does not have much appetite. Not eating very much.  No swallowing difficulty Denies any pain today.  Fatigue:Chronic onset, perisistent, no aggravating or improving factors, no associated symptoms.  She had urinary symptoms a few days before and was started on a course of Bactrim. She feels better now.   Review of Systems  Constitutional: Positive for appetite change and fatigue. Negative for chills and fever.  HENT:   Negative for hearing loss and voice change.   Eyes: Negative for eye problems.  Respiratory: Negative for chest tightness and cough.   Cardiovascular: Negative for chest pain.  Gastrointestinal: Negative for abdominal distention, abdominal pain and blood in stool.  Endocrine: Negative for hot flashes.   Genitourinary: Negative for difficulty urinating and frequency.   Musculoskeletal: Negative for arthralgias.  Skin: Negative for itching and rash.  Neurological: Negative for extremity weakness.  Hematological: Negative for adenopathy.  Psychiatric/Behavioral: Negative for confusion.    Past Medical History:  Diagnosis Date  . Arthritis   . Asthma   . COPD (chronic obstructive pulmonary disease) ([place])   . Dysphonia   . Dyspnea   . Esophageal cancer ([place])   . GERD (gastroesophageal reflux disease)   . Heart murmur   . History of kidney stones   . Hypertension   . Hypokalemia 10/07/2017  . Melanoma ([place])   . OAB (overactive bladder)   . OSA on CPAP   . 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 Personal history of chemotherapy    esophageal cancer  . Personal history of radiation therapy    esophageal cancer  . Pupil asymmetry    R pupil defect  . Skin cancer   . Sleep apnea   . Spinal cord abscess    Past Surgical History:  Procedure Laterality Date  . ABDOMINAL HYSTERECTOMY    . ANTERIOR CERVICAL DECOMP/DISCECTOMY FUSION N/A 07/16/2013   Procedure: ANTERIOR CERVICAL DECOMPRESSION/DISCECTOMY FUSION mutlipleLEVELS C4-7;  Surgeon: Erline Levine, MD;  Location: [facility]S;  Service: Neurosurgery;  Laterality: N/A;  . APPENDECTOMY    . carpel tunn Bilateral   . CATARACT EXTRACTION    . CATARACT EXTRACTION, BILATERAL    . CHOLECYSTECTOMY    . dental implant    . ESOPHAGOGASTRODUODENOSCOPY Left 03/21/2017   Procedure: ESOPHAGOGASTRODUODENOSCOPY (EGD);  Surgeon: Virgel Manifold, MD;  Location: [facility]COPY;  Service: Endoscopy;  Laterality: Left;  .  ESOPHAGOGASTRODUODENOSCOPY (EGD) WITH PROPOFOL N/A 08/20/2017   Procedure: ESOPHAGOGASTRODUODENOSCOPY (EGD) WITH PROPOFOL;  Surgeon: Virgel Manifold, MD;  Location: [facility] ENDOSCOPY;  Service: Endoscopy;  Laterality: N/A;  . ESOPHAGOGASTRODUODENOSCOPY (EGD) WITH PROPOFOL N/A 09/19/2017   Procedure:  ESOPHAGOGASTRODUODENOSCOPY (EGD) WITH PROPOFOL;  Surgeon: Virgel Manifold, MD;  Location: [facility] ENDOSCOPY;  Service: Endoscopy;  Laterality: N/A;  . ESOPHAGOGASTRODUODENOSCOPY (EGD) WITH PROPOFOL N/A 10/03/2017   Procedure: ESOPHAGOGASTRODUODENOSCOPY (EGD) WITH PROPOFOL;  Surgeon: Virgel Manifold, MD;  Location: [facility] ENDOSCOPY;  Service: Endoscopy;  Laterality: N/A;  . ESOPHAGOGASTRODUODENOSCOPY (EGD) WITH PROPOFOL N/A 10/22/2017   Procedure: ESOPHAGOGASTRODUODENOSCOPY (EGD) WITH PROPOFOL;  Surgeon: Virgel Manifold, MD;  Location: [facility] ENDOSCOPY;  Service: Endoscopy;  Laterality: N/A;  . ESOPHAGOGASTRODUODENOSCOPY (EGD) WITH PROPOFOL N/A 11/18/2017   Procedure: ESOPHAGOGASTRODUODENOSCOPY (EGD) WITH PROPOFOL;  Surgeon: Virgel Manifold, MD;  Location: [facility] ENDOSCOPY;  Service: Endoscopy;  Laterality: N/A;  . ESOPHAGOGASTRODUODENOSCOPY (EGD) WITH PROPOFOL N/A 12/17/2017   Procedure: ESOPHAGOGASTRODUODENOSCOPY (EGD) WITH PROPOFOL;  Surgeon: Virgel Manifold, MD;  Location: [facility] ENDOSCOPY;  Service: Endoscopy;  Laterality: N/A;  . ESOPHAGOGASTRODUODENOSCOPY (EGD) WITH PROPOFOL N/A 01/08/2018   Procedure: ESOPHAGOGASTRODUODENOSCOPY (EGD) WITH PROPOFOL;  Surgeon: Virgel Manifold, MD;  Location: [facility] ENDOSCOPY;  Service: Endoscopy;  Laterality: N/A;  . ESOPHAGOGASTRODUODENOSCOPY (EGD) WITH PROPOFOL N/A 01/22/2018   Procedure: ESOPHAGOGASTRODUODENOSCOPY (EGD) WITH PROPOFOL;  Surgeon: Virgel Manifold, MD;  Location: [facility] ENDOSCOPY;  Service: Endoscopy;  Laterality: N/A;  . ESOPHAGOGASTRODUODENOSCOPY (EGD) WITH PROPOFOL N/A 07/15/2018   Procedure: ESOPHAGOGASTRODUODENOSCOPY (EGD) WITH PROPOFOL;  Surgeon: Virgel Manifold, MD;  Location: [facility] ENDOSCOPY;  Service: Endoscopy;  Laterality: N/A;  . EYE SURGERY    . GASTRIC BYPASS    . HERNIA REPAIR    . HIATAL HERNIA REPAIR    . JOINT REPLACEMENT Bilateral    knee  . MELANOMA EXCISION    . PORTA CATH INSERTION N/A 04/07/2017    Procedure: PORTA CATH INSERTION;  Surgeon: Algernon Huxley, MD;  Location: [facility] CV LAB;  Service: Cardiovascular;  Laterality: N/A;  . POSTERIOR CERVICAL FUSION/FORAMINOTOMY N/A 07/28/2013   Procedure: Cervical four-seven  posterior cervical fusion;  Surgeon: Erline Levine, MD;  Location: [facility] NEURO ORS;  Service: Neurosurgery;  Laterality: N/A;  . TONSILLECTOMY    . TOTAL KNEE ARTHROPLASTY      Family History  Problem Relation Age of Onset  . Breast cancer Mother 71  . Arthritis-Osteo Mother   . Breast cancer Sister 17  . Bladder Cancer Sister   . Bladder Cancer Father   . Tongue cancer Father   . Congenital heart disease Father   . Prostate cancer Brother   . Uterine cancer Maternal Aunt   . Lung cancer Paternal Uncle   . Leukemia Maternal Grandmother   . Liver cancer Paternal Grandmother   . Colon cancer Neg Hx     Social History   Socioeconomic History  . Marital status: Widowed    Spouse name: Not on file  . Number of children: 2  . Years of education: Not on file  . Highest education level: Master's degree (e.g., MA, MS, MEng, MEd, MSW, MBA)  Occupational History  . Not on file  Social Needs  . Financial resource strain: Not hard at all  . Food insecurity:    Worry: Never true    Inability: Never true  . Transportation needs:    Medical: No    Non-medical: No  Tobacco Use  . Smoking status: Former Smoker    Packs/day:  1.00    Years: 20.00    Pack years: 20.00    Types: Cigarettes    Last attempt to quit: 1977    Years since quitting: 43.3  . Smokeless tobacco: Never Used  Substance and Sexual Activity  . Alcohol use: No  . Drug use: Never  . Sexual activity: Not Currently  Lifestyle  . Physical activity:    Days per week: 2 days    Minutes per session: 30 min  . Stress: Not at all  Relationships  . Social connections:    Talks on phone: More than three times a week    Gets together: More than three times a week    Attends religious service: More  than 4 times per year    Active member of club or organization: Yes    Attends meetings of clubs or organizations: More than 4 times per year    Relationship status: Widowed  . Intimate partner violence:    Fear of current or ex partner: No    Emotionally abused: No    Physically abused: No    Forced sexual activity: No  Other Topics Concern  . Not on file  Social History Narrative   Marital Status- Widowed    Lives by herself    Employement- Retired Pharmacist, hospital   Exercise hx- Does PT     Current Outpatient Medications on File Prior to Visit  Medication Sig Dispense Refill  . albuterol (PROVENTIL HFA;VENTOLIN HFA) 108 (90 Base) MCG/ACT inhaler Inhale 2 puffs into the lungs every 4 (four) hours as needed for wheezing or shortness of breath.    Marland Kitchen aspirin EC 81 MG tablet Take 1 tablet (81 mg total) by mouth every other day.    . calcium carbonate (TUMS EX) 750 MG chewable tablet Chew 1 tablet by mouth as needed for heartburn.     . chlorhexidine (PERIDEX) 0.12 % solution Use as directed 15 mLs in the mouth or throat 2 (two) times daily.    . Cholecalciferol (VITAMIN D) 2000 UNITS CAPS Take 2,000 Units by mouth daily.    Marland Kitchen dexamethasone (DECADRON) 4 MG tablet Take 0.5 tablets (2 mg total) by mouth daily. 30 tablet 0  . Diclofenac Sodium 1 % CREA Apply to left arm as needed for pain three times daily 120 g 0  . dronabinol (MARINOL) 5 MG capsule TAKE 1 CAPSULE TWICE DAILY BEFORE A MEAL 60 capsule 1  . fluticasone (FLONASE) 50 MCG/ACT nasal spray Place 1-2 sprays into both nostrils daily. 16 g 5  . furosemide (LASIX) 20 MG tablet TAKE 1-2 TABLETS BY MOUTH DAILY AS NEEDED FOR EDEMA 180 tablet 1  . hydrocortisone cream 1 % Apply 1 application topically daily as needed for itching.     . loratadine (CLARITIN) 10 MG tablet Take 10 mg by mouth daily.    . Multiple Vitamins-Minerals (MULTIVITAMIN ADULT) CHEW Chew 1 tablet by mouth daily.     Marland Kitchen oxybutynin (DITROPAN) 5 MG tablet TAKE ONE TABLET BY MOUTH  EVERY MORNING AND TAKE ONE-HALF TABLET AT BEDTIME 45 tablet 3  . pantoprazole (PROTONIX) 40 MG tablet TAKE 1 TABLET BY MOUTH TWICE DAILY BEFORE A MEAL 60 tablet 3  . Polyethyl Glycol-Propyl Glycol (SYSTANE OP) Apply 1 drop to eye daily as needed (dry eyes).    . potassium chloride (KLOR-CON) 20 MEQ packet Take 20 mEq by mouth daily. 30 packet 1  . pregabalin (LYRICA) 150 MG capsule TAKE 1 CAPSULE TWICE DAILY 60 capsule 3  .  pyridOXINE (VITAMIN B-6) 100 MG tablet Take 1 tablet (100 mg total) by mouth daily. 30 tablet 3  . SENNA-PLUS 8.6-50 MG tablet TAKE 2 TABLETS BY MOUTH DAILY 60 tablet 0  . sulfamethoxazole-trimethoprim (BACTRIM) 400-80 MG tablet Take 1 tablet by mouth 2 (two) times daily. 10 tablet 0  . traMADol (ULTRAM) 50 MG tablet Take 1 tablet (50 mg total) by mouth every 6 (six) hours as needed. 60 tablet 0  . traZODone (DESYREL) 50 MG tablet Take 0.5-1 tablets (25-50 mg total) by mouth at bedtime as needed for sleep.    Marland Kitchen trifluridine-tipiracil (LONSURF) 15-6.14 MG tablet Take 2 tablets by mouth two (2) times daily after a meal. Take with two 20mg  tablets. Take only on days 1-5, 8-12. Repeat every 28days. 40 tablet 3  . trifluridine-tipiracil (LONSURF) 20-8.19 MG tablet Take 2 tablets by mouth two (2) times daily after a meal. Take with two 15mg  tablets. Take only on days 1-5, 8-12. Repeat every 28days. 40 tablet 3  . MYRBETRIQ 25 MG TB24 tablet     . [DISCONTINUED] prochlorperazine (COMPAZINE) 10 MG tablet Take 1 tablet (10 mg total) by mouth every 6 (six) hours as needed (Nausea or vomiting). 30 tablet 1   Current Facility-Administered Medications on File Prior to Visit  Medication Dose Route Frequency Provider Last Rate Last Dose  . sodium chloride flush (NS) 0.9 % injection 10 mL  10 mL Intravenous PRN Earlie Server, MD   10 mL at 01/26/18 0904    Allergies  Allergen Reactions  . Cefuroxime Nausea Only  . Codeine Nausea Only  . Doxycycline Other (See Comments)    Lip swelling  .  Gabapentin Other (See Comments)    Swelling.   . Iohexol Itching    07-15-13 pt developed itching on fingers after contrast given. Dr. Irish Elders looked at pt and said to put in system as allergy. BB  . Other Other (See Comments)    Contrast Dye- caused fever, chills, awful feeling Bandages - itching, rash  . Oxycodone Other (See Comments)    nausea  . Quinolones Swelling    Lip swelling  . Synvisc [[place] G-F 20] Swelling       Observations/Objective: There were no vitals filed for this visit. There is no height or weight on file to calculate BMI.  Pain level 0 Physical Exam  Constitutional: She is oriented to person, place, and time. No distress.  HENT:  Head: Normocephalic and atraumatic.  Neck: Normal range of motion.  Pulmonary/Chest: Effort normal. No respiratory distress.  Neurological: She is alert and oriented to person, place, and time.  Psychiatric: Affect normal.    CBC    Component Value Date/Time   WBC 7.8 09/03/2018 1338   RBC 2.84 (L) 09/03/2018 1338   HGB 8.0 (L) 09/03/2018 1338   HGB 12.9 07/13/2013 0609   HCT 26.8 (L) 09/03/2018 1338   HCT 38.8 07/13/2013 0609   PLT 282 09/03/2018 1338   PLT 212 07/13/2013 0609   MCV 94.4 09/03/2018 1338   MCV 93 07/13/2013 0609   MCH 28.2 09/03/2018 1338   MCHC 29.9 (L) 09/03/2018 1338   RDW 17.7 (H) 09/03/2018 1338   RDW 13.6 07/13/2013 0609   LYMPHSABS 0.7 09/03/2018 1338   MONOABS 0.8 09/03/2018 1338   EOSABS 0.2 09/03/2018 1338   BASOSABS 0.0 09/03/2018 1338    CMP     Component Value Date/Time   NA 135 09/03/2018 1338   NA 138 07/13/2013 0609   K  4.6 09/03/2018 1338   K 4.1 07/13/2013 0609   CL 97 (L) 09/03/2018 1338   CL 104 07/13/2013 0609   CO2 30 09/03/2018 1338   CO2 29 07/13/2013 0609   GLUCOSE 99 09/03/2018 1338   GLUCOSE 115 (H) 07/13/2013 0609   BUN 21 09/03/2018 1338   BUN 15 07/13/2013 0609   CREATININE 0.85 09/03/2018 1338   CREATININE 0.67 07/13/2013 0609   CALCIUM 8.5 (L) 09/03/2018  1338   CALCIUM 8.3 (L) 07/13/2013 0609   PROT 6.9 09/03/2018 1338   ALBUMIN 2.8 (L) 09/03/2018 1338   AST 45 (H) 09/03/2018 1338   ALT 18 09/03/2018 1338   ALKPHOS 156 (H) 09/03/2018 1338   BILITOT 0.6 09/03/2018 1338   GFRNONAA >60 09/03/2018 1338   GFRNONAA >60 07/13/2013 0609   GFRAA >60 09/03/2018 1338   GFRAA >60 07/13/2013 0609     Assessment and Plan: 1. Esophageal adenocarcinoma ([place])   2. Encounter for antineoplastic chemotherapy   3. Port-A-Cath in place   4. Anemia due to antineoplastic chemotherapy   5. Weight loss    Metastatic esophageal cancer, with bone and liver metastasis.  On Third line treament. Prognosis is poor. Discussed with patient.  Labs are reviewed and discussed with patient.  Clinically she tolerates Longsurf. Continue current regimen.   Continue medi port flush every 6 weeks.  She has established care with palliative care    Weight loss, follow up with dietitian.    anemia is getting worse, due to chemotherapy. Repeat labs in 1 week.  Check iron ferritin, retic panel, vitamin B12 and folate.  Medi port, flush Q6-8 weeks.   Follow Up Instructions: Lab in 1 week.  Lab and MD assessment in 2 weeks.   I discussed the assessment and treatment plan with the patient. The patient was provided an opportunity to ask questions and all were answered. The patient agreed with the plan and demonstrated an understanding of the instructions.  The patient was advised to call back or seek an in-person evaluation if the symptoms worsen or if the condition fails to improve as anticipated.    Earlie Server, MD 09/06/2018 3:52 PM

## 2018-09-07 ENCOUNTER — Inpatient Hospital Stay: Payer: Medicare Other

## 2018-09-07 NOTE — Telephone Encounter (Signed)
Called [place] to check the status of denied application appeal.  Representative stated that the appeal is still being processed and that Kerry Dory would be in touch once a decision has been made.

## 2018-09-07 NOTE — Progress Notes (Signed)
Nutrition Follow-up:  Patient with metastatic esophageal adenocarcinoma currently on 3rd line therapy of lonsurf.    Called patient for nutrition follow-up.  Patient reports she continues to not have much of an appetite.  Reports foods have off taste.  Can't tolerate sweet things.  Reports that she has been tolerating yogurt and fruit for breakfast recently.  Her and son have been ordering take out from local restaurants during pandemic and had pork chops and baked potato last night.  Reports ate some of it.  Reports issues with constipation.      Medications: reviewed  Labs: reviewed  Anthropometrics:   Weight noted 193 lb on 4/8 decreased from 194 lb on 07/29/2018   NUTRITION DIAGNOSIS:  Inadequate oral intake continues   INTERVENTION:  Patient unsure if she received email handout on taste change so will email it to her again. Discussed alterative options to meat as good protein sources.      MONITORING, EVALUATION, GOAL: weight trends, intake   NEXT VISIT:  Phone f/u 1 month, May 18  [name] B. Zenia Resides, [place], [place] Registered Dietitian [phone] (pager)

## 2018-09-08 NOTE — Telephone Encounter (Signed)
Received a fax from [facility] Patient Support that Mrs Warrick has been approved for patient assistance. She will receive Lonsurf until 05/13/2019 at no out of pocket cost to her. She will then need to re-apply if no grant funding is open at that time.  I called and spoke with the patient and she was very happy she got approved.  I told her Jeanmarie Plant would be reaching out to her about how to call for refills and to call the office if she has any problems.  [place] Patient [place] Phone [phone] Fax [phone] 09/08/2018 12:06 PM

## 2018-09-10 ENCOUNTER — Other Ambulatory Visit: Payer: Self-pay

## 2018-09-11 ENCOUNTER — Other Ambulatory Visit: Payer: Self-pay

## 2018-09-11 ENCOUNTER — Inpatient Hospital Stay: Payer: Medicare Other | Attending: Oncology

## 2018-09-11 DIAGNOSIS — Z95828 Presence of other vascular implants and grafts: Secondary | ICD-10-CM | POA: Insufficient documentation

## 2018-09-11 DIAGNOSIS — C155 Malignant neoplasm of lower third of esophagus: Secondary | ICD-10-CM | POA: Insufficient documentation

## 2018-09-11 DIAGNOSIS — E46 Unspecified protein-calorie malnutrition: Secondary | ICD-10-CM | POA: Diagnosis not present

## 2018-09-11 DIAGNOSIS — R531 Weakness: Secondary | ICD-10-CM | POA: Diagnosis not present

## 2018-09-11 DIAGNOSIS — T451X5A Adverse effect of antineoplastic and immunosuppressive drugs, initial encounter: Secondary | ICD-10-CM

## 2018-09-11 DIAGNOSIS — I2510 Atherosclerotic heart disease of native coronary artery without angina pectoris: Secondary | ICD-10-CM | POA: Insufficient documentation

## 2018-09-11 DIAGNOSIS — Z9221 Personal history of antineoplastic chemotherapy: Secondary | ICD-10-CM | POA: Diagnosis not present

## 2018-09-11 DIAGNOSIS — G4733 Obstructive sleep apnea (adult) (pediatric): Secondary | ICD-10-CM | POA: Insufficient documentation

## 2018-09-11 DIAGNOSIS — Z7982 Long term (current) use of aspirin: Secondary | ICD-10-CM | POA: Insufficient documentation

## 2018-09-11 DIAGNOSIS — R5383 Other fatigue: Secondary | ICD-10-CM | POA: Diagnosis not present

## 2018-09-11 DIAGNOSIS — C787 Secondary malignant neoplasm of liver and intrahepatic bile duct: Secondary | ICD-10-CM | POA: Diagnosis not present

## 2018-09-11 DIAGNOSIS — M1990 Unspecified osteoarthritis, unspecified site: Secondary | ICD-10-CM | POA: Insufficient documentation

## 2018-09-11 DIAGNOSIS — Z85820 Personal history of malignant melanoma of skin: Secondary | ICD-10-CM | POA: Insufficient documentation

## 2018-09-11 DIAGNOSIS — G4709 Other insomnia: Secondary | ICD-10-CM | POA: Insufficient documentation

## 2018-09-11 DIAGNOSIS — Z5111 Encounter for antineoplastic chemotherapy: Secondary | ICD-10-CM

## 2018-09-11 DIAGNOSIS — D6481 Anemia due to antineoplastic chemotherapy: Secondary | ICD-10-CM | POA: Diagnosis not present

## 2018-09-11 DIAGNOSIS — Z923 Personal history of irradiation: Secondary | ICD-10-CM | POA: Diagnosis not present

## 2018-09-11 DIAGNOSIS — N3000 Acute cystitis without hematuria: Secondary | ICD-10-CM | POA: Insufficient documentation

## 2018-09-11 DIAGNOSIS — Z7951 Long term (current) use of inhaled steroids: Secondary | ICD-10-CM | POA: Insufficient documentation

## 2018-09-11 DIAGNOSIS — K5900 Constipation, unspecified: Secondary | ICD-10-CM | POA: Diagnosis not present

## 2018-09-11 DIAGNOSIS — G893 Neoplasm related pain (acute) (chronic): Secondary | ICD-10-CM | POA: Insufficient documentation

## 2018-09-11 DIAGNOSIS — T451X5S Adverse effect of antineoplastic and immunosuppressive drugs, sequela: Secondary | ICD-10-CM | POA: Diagnosis not present

## 2018-09-11 DIAGNOSIS — I350 Nonrheumatic aortic (valve) stenosis: Secondary | ICD-10-CM | POA: Diagnosis not present

## 2018-09-11 DIAGNOSIS — Z66 Do not resuscitate: Secondary | ICD-10-CM | POA: Insufficient documentation

## 2018-09-11 DIAGNOSIS — Z9989 Dependence on other enabling machines and devices: Secondary | ICD-10-CM | POA: Insufficient documentation

## 2018-09-11 DIAGNOSIS — C7951 Secondary malignant neoplasm of bone: Secondary | ICD-10-CM | POA: Insufficient documentation

## 2018-09-11 DIAGNOSIS — N3281 Overactive bladder: Secondary | ICD-10-CM | POA: Insufficient documentation

## 2018-09-11 DIAGNOSIS — I10 Essential (primary) hypertension: Secondary | ICD-10-CM | POA: Diagnosis not present

## 2018-09-11 DIAGNOSIS — R634 Abnormal weight loss: Secondary | ICD-10-CM | POA: Insufficient documentation

## 2018-09-11 DIAGNOSIS — J449 Chronic obstructive pulmonary disease, unspecified: Secondary | ICD-10-CM | POA: Insufficient documentation

## 2018-09-11 DIAGNOSIS — Z87442 Personal history of urinary calculi: Secondary | ICD-10-CM | POA: Insufficient documentation

## 2018-09-11 DIAGNOSIS — R600 Localized edema: Secondary | ICD-10-CM | POA: Diagnosis not present

## 2018-09-11 DIAGNOSIS — Z87891 Personal history of nicotine dependence: Secondary | ICD-10-CM | POA: Insufficient documentation

## 2018-09-11 DIAGNOSIS — F3289 Other specified depressive episodes: Secondary | ICD-10-CM | POA: Insufficient documentation

## 2018-09-11 DIAGNOSIS — K219 Gastro-esophageal reflux disease without esophagitis: Secondary | ICD-10-CM | POA: Insufficient documentation

## 2018-09-11 DIAGNOSIS — R630 Anorexia: Secondary | ICD-10-CM | POA: Diagnosis not present

## 2018-09-11 DIAGNOSIS — Z803 Family history of malignant neoplasm of breast: Secondary | ICD-10-CM | POA: Insufficient documentation

## 2018-09-11 DIAGNOSIS — Z87440 Personal history of urinary (tract) infections: Secondary | ICD-10-CM | POA: Insufficient documentation

## 2018-09-11 DIAGNOSIS — Z79899 Other long term (current) drug therapy: Secondary | ICD-10-CM | POA: Insufficient documentation

## 2018-09-11 DIAGNOSIS — C159 Malignant neoplasm of esophagus, unspecified: Secondary | ICD-10-CM

## 2018-09-11 LAB — RETIC PANEL
Immature Retic Fract: 27.5 % — ABNORMAL HIGH (ref 2.3–15.9)
RBC.: 2.92 MIL/uL — ABNORMAL LOW (ref 3.87–5.11)
Retic Count, Absolute: 74.8 10*3/uL (ref 19.0–186.0)
Retic Ct Pct: 2.6 % (ref 0.4–3.1)
Reticulocyte Hemoglobin: 38.8 pg (ref >27.9)

## 2018-09-11 LAB — IRON AND TIBC
Iron: 79 ug/dL (ref 28–170)
Saturation Ratios: 32 % — ABNORMAL HIGH (ref 10.4–31.8)
TIBC: 249 ug/dL — ABNORMAL LOW (ref 250–450)
UIBC: 170 ug/dL

## 2018-09-11 LAB — FOLATE: Folate: 12.2 ng/mL (ref >5.9)

## 2018-09-11 LAB — FERRITIN: Ferritin: 245 ng/mL (ref 11–307)

## 2018-09-11 LAB — VITAMIN B12: Vitamin B-12: 1011 pg/mL — ABNORMAL HIGH (ref 180–914)

## 2018-09-18 ENCOUNTER — Inpatient Hospital Stay (HOSPITAL_BASED_OUTPATIENT_CLINIC_OR_DEPARTMENT_OTHER): Payer: Medicare Other | Admitting: Nurse Practitioner

## 2018-09-18 ENCOUNTER — Other Ambulatory Visit: Payer: Self-pay

## 2018-09-18 ENCOUNTER — Inpatient Hospital Stay: Payer: Medicare Other

## 2018-09-18 ENCOUNTER — Inpatient Hospital Stay (HOSPITAL_BASED_OUTPATIENT_CLINIC_OR_DEPARTMENT_OTHER): Payer: Medicare Other | Admitting: Oncology

## 2018-09-18 ENCOUNTER — Encounter (INDEPENDENT_AMBULATORY_CARE_PROVIDER_SITE_OTHER): Payer: Self-pay

## 2018-09-18 VITALS — BP 110/72 | HR 78 | Temp 97.3°F | Wt 190.1 lb

## 2018-09-18 DIAGNOSIS — R600 Localized edema: Secondary | ICD-10-CM

## 2018-09-18 DIAGNOSIS — Z5111 Encounter for antineoplastic chemotherapy: Secondary | ICD-10-CM

## 2018-09-18 DIAGNOSIS — Z87442 Personal history of urinary calculi: Secondary | ICD-10-CM

## 2018-09-18 DIAGNOSIS — T451X5S Adverse effect of antineoplastic and immunosuppressive drugs, sequela: Secondary | ICD-10-CM

## 2018-09-18 DIAGNOSIS — Z95828 Presence of other vascular implants and grafts: Secondary | ICD-10-CM | POA: Diagnosis not present

## 2018-09-18 DIAGNOSIS — Z87891 Personal history of nicotine dependence: Secondary | ICD-10-CM

## 2018-09-18 DIAGNOSIS — C7951 Secondary malignant neoplasm of bone: Secondary | ICD-10-CM

## 2018-09-18 DIAGNOSIS — I2510 Atherosclerotic heart disease of native coronary artery without angina pectoris: Secondary | ICD-10-CM

## 2018-09-18 DIAGNOSIS — C159 Malignant neoplasm of esophagus, unspecified: Secondary | ICD-10-CM

## 2018-09-18 DIAGNOSIS — R630 Anorexia: Secondary | ICD-10-CM

## 2018-09-18 DIAGNOSIS — Z87440 Personal history of urinary (tract) infections: Secondary | ICD-10-CM

## 2018-09-18 DIAGNOSIS — C155 Malignant neoplasm of lower third of esophagus: Secondary | ICD-10-CM

## 2018-09-18 DIAGNOSIS — Z923 Personal history of irradiation: Secondary | ICD-10-CM

## 2018-09-18 DIAGNOSIS — C787 Secondary malignant neoplasm of liver and intrahepatic bile duct: Secondary | ICD-10-CM | POA: Diagnosis not present

## 2018-09-18 DIAGNOSIS — N3000 Acute cystitis without hematuria: Secondary | ICD-10-CM

## 2018-09-18 DIAGNOSIS — R634 Abnormal weight loss: Secondary | ICD-10-CM

## 2018-09-18 DIAGNOSIS — Z79899 Other long term (current) drug therapy: Secondary | ICD-10-CM

## 2018-09-18 DIAGNOSIS — I10 Essential (primary) hypertension: Secondary | ICD-10-CM

## 2018-09-18 DIAGNOSIS — Z7982 Long term (current) use of aspirin: Secondary | ICD-10-CM

## 2018-09-18 DIAGNOSIS — Z85820 Personal history of malignant melanoma of skin: Secondary | ICD-10-CM

## 2018-09-18 DIAGNOSIS — M1990 Unspecified osteoarthritis, unspecified site: Secondary | ICD-10-CM

## 2018-09-18 DIAGNOSIS — Z7951 Long term (current) use of inhaled steroids: Secondary | ICD-10-CM

## 2018-09-18 DIAGNOSIS — D6481 Anemia due to antineoplastic chemotherapy: Secondary | ICD-10-CM

## 2018-09-18 DIAGNOSIS — R5383 Other fatigue: Secondary | ICD-10-CM

## 2018-09-18 DIAGNOSIS — K5900 Constipation, unspecified: Secondary | ICD-10-CM

## 2018-09-18 DIAGNOSIS — N3281 Overactive bladder: Secondary | ICD-10-CM

## 2018-09-18 DIAGNOSIS — Z9221 Personal history of antineoplastic chemotherapy: Secondary | ICD-10-CM

## 2018-09-18 DIAGNOSIS — T451X5A Adverse effect of antineoplastic and immunosuppressive drugs, initial encounter: Secondary | ICD-10-CM

## 2018-09-18 DIAGNOSIS — Z803 Family history of malignant neoplasm of breast: Secondary | ICD-10-CM

## 2018-09-18 DIAGNOSIS — Z7189 Other specified counseling: Secondary | ICD-10-CM

## 2018-09-18 DIAGNOSIS — K219 Gastro-esophageal reflux disease without esophagitis: Secondary | ICD-10-CM

## 2018-09-18 DIAGNOSIS — J449 Chronic obstructive pulmonary disease, unspecified: Secondary | ICD-10-CM

## 2018-09-18 DIAGNOSIS — G893 Neoplasm related pain (acute) (chronic): Secondary | ICD-10-CM

## 2018-09-18 LAB — COMPREHENSIVE METABOLIC PANEL
ALT: 21 U/L (ref 0–44)
AST: 48 U/L — ABNORMAL HIGH (ref 15–41)
Albumin: 3.2 g/dL — ABNORMAL LOW (ref 3.5–5.0)
Alkaline Phosphatase: 126 U/L (ref 38–126)
Anion gap: 9 (ref 5–15)
BUN: 17 mg/dL (ref 8–23)
CO2: 30 mmol/L (ref 22–32)
Calcium: 8.6 mg/dL — ABNORMAL LOW (ref 8.9–10.3)
Chloride: 100 mmol/L (ref 98–111)
Creatinine, Ser: 0.95 mg/dL (ref 0.44–1.00)
GFR calc Af Amer: >60 mL/min (ref >60)
GFR calc non Af Amer: 56 mL/min — ABNORMAL LOW (ref >60)
Glucose, Bld: 95 mg/dL (ref 70–99)
Potassium: 3.7 mmol/L (ref 3.5–5.1)
Sodium: 139 mmol/L (ref 135–145)
Total Bilirubin: 0.8 mg/dL (ref 0.3–1.2)
Total Protein: 6.8 g/dL (ref 6.5–8.1)

## 2018-09-18 LAB — CBC WITH DIFFERENTIAL/PLATELET
Abs Immature Granulocytes: 0.03 10*3/uL (ref 0.00–0.07)
Basophils Absolute: 0 10*3/uL (ref 0.0–0.1)
Basophils Relative: 0 %
Eosinophils Absolute: 0 10*3/uL (ref 0.0–0.5)
Eosinophils Relative: 1 %
HCT: 26.6 % — ABNORMAL LOW (ref 36.0–46.0)
Hemoglobin: 7.9 g/dL — ABNORMAL LOW (ref 12.0–15.0)
Immature Granulocytes: 1 %
Lymphocytes Relative: 19 %
Lymphs Abs: 0.7 10*3/uL (ref 0.7–4.0)
MCH: 29.6 pg (ref 26.0–34.0)
MCHC: 29.7 g/dL — ABNORMAL LOW (ref 30.0–36.0)
MCV: 99.6 fL (ref 80.0–100.0)
Monocytes Absolute: 0.3 10*3/uL (ref 0.1–1.0)
Monocytes Relative: 8 %
Neutro Abs: 2.6 10*3/uL (ref 1.7–7.7)
Neutrophils Relative %: 71 %
Platelets: 179 10*3/uL (ref 150–400)
RBC: 2.67 MIL/uL — ABNORMAL LOW (ref 3.87–5.11)
RDW: 22.9 % — ABNORMAL HIGH (ref 11.5–15.5)
WBC: 3.6 10*3/uL — ABNORMAL LOW (ref 4.0–10.5)
nRBC: 0 % (ref 0.0–0.2)

## 2018-09-18 LAB — URINALYSIS, COMPLETE (UACMP) WITH MICROSCOPIC
Bilirubin Urine: NEGATIVE
Glucose, UA: NEGATIVE mg/dL
Ketones, ur: NEGATIVE mg/dL
Nitrite: POSITIVE — AB
Protein, ur: NEGATIVE mg/dL
Specific Gravity, Urine: 1.009 (ref 1.005–1.030)
WBC, UA: >50 WBC/hpf — ABNORMAL HIGH (ref 0–5)
pH: 5 (ref 5.0–8.0)

## 2018-09-18 MED ORDER — TRAMADOL HCL 50 MG PO TABS: 50.0000 mg | ORAL_TABLET | Freq: Four times a day (QID) | ORAL | 0 refills | Status: AC | PRN

## 2018-09-18 MED ORDER — DRONABINOL 5 MG PO CAPS: 5.0000 mg | ORAL_CAPSULE | Freq: Four times a day (QID) | ORAL | 0 refills | Status: DC

## 2018-09-18 NOTE — Progress Notes (Signed)
Patient reports frequent urination having to go every 15 minutes.

## 2018-09-18 NOTE — Progress Notes (Signed)
[place]  [phone] [phone]   Name: [name] Date: 09/18/2018 MRN: [id number]  DOB: 1934/08/14  Patient Care Team: Tonia Ghent, MD as PCP - General (Family Medicine)    REASON FOR CONSULTATION: Palliative Care consult requested for this 84 y.o. female with multiple medical problems including stage IV which for esophageal cancer stage IV esophageal cancer status post chemotherapy, and immunotherapy and XRT with disease progression most recently noted with Keytruda.  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  Patient has recurrent UTIs and was last hospitalized 04/12/2018 to 04/14/2018 with same.  Palliative care was consulted to help address goals.    SOCIAL HISTORY:     reports that she quit smoking about 43 years ago. Her smoking use included cigarettes. She has a 20.00 pack-year smoking history. She has never used smokeless tobacco. She reports that she does not drink alcohol or use drugs.  ADVANCE DIRECTIVES:  Patient son, Yvone Neu is her healthcare power of attorney  CODE STATUS: DNR  PAST MEDICAL HISTORY: Past Medical History:  Diagnosis Date   Arthritis    Asthma    COPD (chronic obstructive pulmonary disease) ([place])    Dysphonia    Dyspnea    Esophageal cancer ([place])    GERD (gastroesophageal reflux disease)    Heart murmur    History of kidney stones    Hypertension    Hypokalemia 10/07/2017   Melanoma ([place])    OAB (overactive bladder)    OSA on CPAP    Paresthesia    from neck down after spinal abscess   Personal history of chemotherapy    esophageal cancer   Personal history of radiation therapy    esophageal cancer   Pupil asymmetry    R pupil defect   Skin cancer    Sleep apnea    Spinal cord abscess     PAST SURGICAL HISTORY:  Past Surgical History:    Procedure Laterality Date   ABDOMINAL HYSTERECTOMY     ANTERIOR CERVICAL DECOMP/DISCECTOMY FUSION N/A 07/16/2013   Procedure: ANTERIOR CERVICAL DECOMPRESSION/DISCECTOMY FUSION mutlipleLEVELS C4-7;  Surgeon: Erline Levine, MD;  Location: [facility];  Service: Neurosurgery;  Laterality: N/A;   APPENDECTOMY     carpel tunn Bilateral    CATARACT EXTRACTION     CATARACT EXTRACTION, BILATERAL     CHOLECYSTECTOMY     dental implant     ESOPHAGOGASTRODUODENOSCOPY Left 03/21/2017   Procedure: ESOPHAGOGASTRODUODENOSCOPY (EGD);  Surgeon: Virgel Manifold, MD;  Location: [facility] ENDOSCOPY;  Service: Endoscopy;  Laterality: Left;   ESOPHAGOGASTRODUODENOSCOPY (EGD) WITH PROPOFOL N/A 08/20/2017   Procedure: ESOPHAGOGASTRODUODENOSCOPY (EGD) WITH PROPOFOL;  Surgeon: Virgel Manifold, MD;  Location: [facility] ENDOSCOPY;  Service: Endoscopy;  Laterality: N/A;   ESOPHAGOGASTRODUODENOSCOPY (EGD) WITH PROPOFOL N/A 09/19/2017   Procedure: ESOPHAGOGASTRODUODENOSCOPY (EGD) WITH PROPOFOL;  Surgeon: Virgel Manifold, MD;  Location: [facility] ENDOSCOPY;  Service: Endoscopy;  Laterality: N/A;   ESOPHAGOGASTRODUODENOSCOPY (EGD) WITH PROPOFOL N/A 10/03/2017   Procedure: ESOPHAGOGASTRODUODENOSCOPY (EGD) WITH PROPOFOL;  Surgeon: Virgel Manifold, MD;  Location: [facility] ENDOSCOPY;  Service: Endoscopy;  Laterality: N/A;   ESOPHAGOGASTRODUODENOSCOPY (EGD) WITH PROPOFOL N/A 10/22/2017   Procedure: ESOPHAGOGASTRODUODENOSCOPY (EGD) WITH PROPOFOL;  Surgeon: Virgel Manifold, MD;  Location: [facility] ENDOSCOPY;  Service: Endoscopy;  Laterality: N/A;   ESOPHAGOGASTRODUODENOSCOPY (EGD) WITH PROPOFOL N/A 11/18/2017   Procedure: ESOPHAGOGASTRODUODENOSCOPY (EGD) WITH PROPOFOL;  Surgeon: Bonna Gains,  Lennette Bihari, MD;  Location: [facility] ENDOSCOPY;  Service: Endoscopy;  Laterality: N/A;   ESOPHAGOGASTRODUODENOSCOPY (EGD) WITH PROPOFOL N/A 12/17/2017   Procedure: ESOPHAGOGASTRODUODENOSCOPY (EGD) WITH PROPOFOL;  Surgeon: Virgel Manifold,  MD;  Location: [facility] ENDOSCOPY;  Service: Endoscopy;  Laterality: N/A;   ESOPHAGOGASTRODUODENOSCOPY (EGD) WITH PROPOFOL N/A 01/08/2018   Procedure: ESOPHAGOGASTRODUODENOSCOPY (EGD) WITH PROPOFOL;  Surgeon: Virgel Manifold, MD;  Location: [facility] ENDOSCOPY;  Service: Endoscopy;  Laterality: N/A;   ESOPHAGOGASTRODUODENOSCOPY (EGD) WITH PROPOFOL N/A 01/22/2018   Procedure: ESOPHAGOGASTRODUODENOSCOPY (EGD) WITH PROPOFOL;  Surgeon: Virgel Manifold, MD;  Location: [facility] ENDOSCOPY;  Service: Endoscopy;  Laterality: N/A;   ESOPHAGOGASTRODUODENOSCOPY (EGD) WITH PROPOFOL N/A 07/15/2018   Procedure: ESOPHAGOGASTRODUODENOSCOPY (EGD) WITH PROPOFOL;  Surgeon: Virgel Manifold, MD;  Location: [facility] ENDOSCOPY;  Service: Endoscopy;  Laterality: N/A;   EYE SURGERY     GASTRIC BYPASS     HERNIA REPAIR     HIATAL HERNIA REPAIR     JOINT REPLACEMENT Bilateral    knee   MELANOMA EXCISION     PORTA CATH INSERTION N/A 04/07/2017   Procedure: PORTA CATH INSERTION;  Surgeon: Algernon Huxley, MD;  Location: [facility];  Service: Cardiovascular;  Laterality: N/A;   POSTERIOR CERVICAL FUSION/FORAMINOTOMY N/A 07/28/2013   Procedure: Cervical four-seven  posterior cervical fusion;  Surgeon: Erline Levine, MD;  Location: [facility];  Service: Neurosurgery;  Laterality: N/A;   TONSILLECTOMY     TOTAL KNEE ARTHROPLASTY      HEMATOLOGY/ONCOLOGY HISTORY:  Oncology History   Patient is a 84 year old female who has metastatic esophageal cancer with liver involvement.  Weight loss 20 pounds for the past year prior to diagnosis.  #EGD during admission showed partially obstructive esophageal mass at the distal part of esophagus. Biopsy was taken. Pathology showed esophageal adenocarcinoma. #US biopsy of liver lesion to proof distant metastasis.  Report family history of breast cancer,but she has been having routine mammograms.  # Lives alone.She's a former smoker.  # Molecular Testing:    #[employer] Cdx: No reportable alternations with companion diagnostic (CDx) claims.  MS stable Tumor mutation burden: Cannot be determined, CCND3 amplification: Equivocal.  Amended reports on 06/04/2017, Tumor mutational burden changed from can not be determined to "TMB -low" on the re-analyzed samples.  # HER 2 negative.   Current Treatment S/p 6 cycles of FOLFOX, PET scan showed excellent partial response from both chemotherapy and radiation, result was discussed with patient.  currently on 5-FU maintenance.  S/p palliative radiation in January 2019 # Developed esophageal stricture secondary to radiation in April and got dilation via endoscopy. EGD shoed Benign-appearing esophageal stenosis. This is a result of radiation therapy.- LA Grade C radiation esophagitis. - The previously noted esophageal mass in Nov 2018 was much decreased indicating good response to radiation/chemotherapy. - Normal stomach.- Normal examined jejunum.- No specimens collected.        Malignant neoplasm of lower third of esophagus (HCC)   03/29/2017 Initial Diagnosis    Malignant neoplasm of lower third of esophagus ([place])    01/27/2018 - 05/24/2018 Chemotherapy    The patient had PACLitaxel (TAXOL) 162 mg in sodium chloride 0.9 % 250 mL chemo infusion (</= 12m/m2), 80 mg/m2 = 162 mg, Intravenous,  Once, 4 of 7 cycles Dose modification: 65 mg/m2 (original dose 80 mg/m2, Cycle 2, Reason: Provider Judgment), 70 mg/m2 (original dose 80 mg/m2, Cycle 4, Reason: Provider Judgment), 70 mg/m2 (original dose 80 mg/m2, Cycle 4, Reason: Provider Judgment) Administration: 162 mg (01/27/2018), 162 mg (  03/02/2018), 132 mg (03/09/2018), 162 mg (02/03/2018), 162 mg (02/09/2018), 162 mg (02/23/2018), 162 mg (03/23/2018), 162 mg (03/30/2018), 162 mg (04/06/2018), 138 mg (04/27/2018), 138 mg (05/04/2018), 138 mg (05/11/2018) ramucirumab (CYRAMZA) 700 mg in sodium chloride 0.9 % 180 mL chemo infusion, 8 mg/kg = 700 mg, Intravenous, Once,  4 of 7 cycles Administration: 700 mg (01/27/2018), 700 mg (03/09/2018), 700 mg (02/09/2018), 700 mg (02/23/2018), 700 mg (03/23/2018), 700 mg (04/06/2018), 700 mg (04/27/2018), 700 mg (05/11/2018)  for chemotherapy treatment.     08/24/2018 -  Chemotherapy    The patient had [No matching medication found in this treatment plan]  for chemotherapy treatment.      Esophageal adenocarcinoma ([place])    Initial Diagnosis    Esophageal adenocarcinoma ([place])    05/27/2018 - 08/18/2018 Chemotherapy    The patient had pembrolizumab (KEYTRUDA) 200 mg in sodium chloride 0.9 % 50 mL chemo infusion, 200 mg, Intravenous, Once, 4 of 5 cycles Administration: 200 mg (05/27/2018), 200 mg (06/17/2018), 200 mg (07/08/2018), 200 mg (07/29/2018)  for chemotherapy treatment.     08/24/2018 -  Chemotherapy    The patient had [No matching medication found in this treatment plan]  for chemotherapy treatment.      ALLERGIES:  is allergic to cefuroxime; codeine; doxycycline; gabapentin; iohexol; other; oxycodone; quinolones; and synvisc [hylan g-f 20].  MEDICATIONS:  Current Outpatient Medications  Medication Sig Dispense Refill   albuterol (PROVENTIL HFA;VENTOLIN HFA) 108 (90 Base) MCG/ACT inhaler Inhale 2 puffs into the lungs every 4 (four) hours as needed for wheezing or shortness of breath.     aspirin EC 81 MG tablet Take 1 tablet (81 mg total) by mouth every other day.     calcium carbonate (TUMS EX) 750 MG chewable tablet Chew 1 tablet by mouth as needed for heartburn.      chlorhexidine (PERIDEX) 0.12 % solution Use as directed 15 mLs in the mouth or throat 2 (two) times daily.     Cholecalciferol (VITAMIN D) 2000 UNITS CAPS Take 2,000 Units by mouth daily.     dexamethasone (DECADRON) 4 MG tablet Take 0.5 tablets (2 mg total) by mouth daily. 30 tablet 0   Diclofenac Sodium 1 % CREA Apply to left arm as needed for pain three times daily 120 g 0   dronabinol (MARINOL) 5 MG capsule Take 1 capsule (5 mg total)  by mouth 4 (four) times daily. 120 capsule 0   fluticasone (FLONASE) 50 MCG/ACT nasal spray Place 1-2 sprays into both nostrils daily. 16 g 5   furosemide (LASIX) 20 MG tablet TAKE 1-2 TABLETS BY MOUTH DAILY AS NEEDED FOR EDEMA 180 tablet 1   hydrocortisone cream 1 % Apply 1 application topically daily as needed for itching.      loratadine (CLARITIN) 10 MG tablet Take 10 mg by mouth daily.     Multiple Vitamins-Minerals (MULTIVITAMIN ADULT) CHEW Chew 1 tablet by mouth daily.      MYRBETRIQ 25 MG TB24 tablet      oxybutynin (DITROPAN) 5 MG tablet TAKE ONE TABLET BY MOUTH EVERY MORNING AND TAKE ONE-HALF TABLET AT BEDTIME 45 tablet 3   pantoprazole (PROTONIX) 40 MG tablet TAKE 1 TABLET BY MOUTH TWICE DAILY BEFORE A MEAL 60 tablet 3   Polyethyl Glycol-Propyl Glycol (SYSTANE OP) Apply 1 drop to eye daily as needed (dry eyes).     potassium chloride (KLOR-CON) 20 MEQ packet Take 20 mEq by mouth daily. 30 packet 1   pregabalin (LYRICA) 150 MG capsule TAKE  1 CAPSULE TWICE DAILY 60 capsule 3   pyridOXINE (VITAMIN B-6) 100 MG tablet Take 1 tablet (100 mg total) by mouth daily. 30 tablet 3   SENNA-PLUS 8.6-50 MG tablet TAKE 2 TABLETS BY MOUTH DAILY 60 tablet 0   sulfamethoxazole-trimethoprim (BACTRIM) 400-80 MG tablet Take 1 tablet by mouth 2 (two) times daily. 10 tablet 0   traMADol (ULTRAM) 50 MG tablet Take 1 tablet (50 mg total) by mouth every 6 (six) hours as needed for up to 30 days. 120 tablet 0   traZODone (DESYREL) 50 MG tablet Take 0.5-1 tablets (25-50 mg total) by mouth at bedtime as needed for sleep.     trifluridine-tipiracil (LONSURF) 15-6.14 MG tablet Take 2 tablets by mouth two (2) times daily after a meal. Take with two 28m tablets. Take only on days 1-5, 8-12. Repeat every 28days. 40 tablet 3   trifluridine-tipiracil (LONSURF) 20-8.19 MG tablet Take 2 tablets by mouth two (2) times daily after a meal. Take with two 172mtablets. Take only on days 1-5, 8-12. Repeat every  28days. 40 tablet 3   No current facility-administered medications for this visit.    Facility-Administered Medications Ordered in Other Visits  Medication Dose Route Frequency Provider Last Rate Last Dose   sodium chloride flush (NS) 0.9 % injection 10 mL  10 mL Intravenous PRN YuEarlie ServerMD   10 mL at 01/26/18 0904    VITAL SIGNS: There were no vitals taken for this visit. There were no vitals filed for this visit.  Estimated body mass index is 34.77 kg/m as calculated from the following:   Height as of 07/24/18: '5\' 2"'  (1.575 m).   Weight as of an earlier encounter on 09/18/18: 190 lb 1.6 oz (86.2 kg).  LABS: CBC:    Component Value Date/Time   WBC 3.6 (L) 09/18/2018 0920   HGB 7.9 (L) 09/18/2018 0920   HGB 12.9 07/13/2013 0609   HCT 26.6 (L) 09/18/2018 0920   HCT 38.8 07/13/2013 0609   PLT 179 09/18/2018 0920   PLT 212 07/13/2013 0609   MCV 99.6 09/18/2018 0920   MCV 93 07/13/2013 0609   NEUTROABS 2.6 09/18/2018 0920   LYMPHSABS 0.7 09/18/2018 0920   MONOABS 0.3 09/18/2018 0920   EOSABS 0.0 09/18/2018 0920   BASOSABS 0.0 09/18/2018 0920   Comprehensive Metabolic Panel:    Component Value Date/Time   NA 139 09/18/2018 0920   NA 138 07/13/2013 0609   K 3.7 09/18/2018 0920   K 4.1 07/13/2013 0609   CL 100 09/18/2018 0920   CL 104 07/13/2013 0609   CO2 30 09/18/2018 0920   CO2 29 07/13/2013 0609   BUN 17 09/18/2018 0920   BUN 15 07/13/2013 0609   CREATININE 0.95 09/18/2018 0920   CREATININE 0.67 07/13/2013 0609   GLUCOSE 95 09/18/2018 0920   GLUCOSE 115 (H) 07/13/2013 0609   CALCIUM 8.6 (L) 09/18/2018 0920   CALCIUM 8.3 (L) 07/13/2013 0609   AST 48 (H) 09/18/2018 0920   ALT 21 09/18/2018 0920   ALKPHOS 126 09/18/2018 0920   BILITOT 0.8 09/18/2018 0920   PROT 6.8 09/18/2018 0920   ALBUMIN 3.2 (L) 09/18/2018 0920    RADIOGRAPHIC STUDIES: No results found.  PERFORMANCE STATUS (ECOG) : 2 - Symptomatic, <50% confined to bed  Review of Systems    Constitutional: Positive for fatigue. Negative for activity change, appetite change, chills, fever and unexpected weight change.  HENT: Negative for dental problem, mouth sores and trouble swallowing.  Eyes: Negative for pain and redness.  Respiratory: Negative for cough and shortness of breath.   Cardiovascular: Negative for chest pain and leg swelling.  Gastrointestinal: Positive for constipation (BM 2x/week). Negative for abdominal pain, anal bleeding, diarrhea, nausea and vomiting.  Endocrine: Negative for cold intolerance and heat intolerance.  Genitourinary: Positive for decreased urine volume and frequency. Negative for difficulty urinating, dysuria, flank pain, genital sores, pelvic pain and urgency.  Musculoskeletal: Positive for gait problem (uses cane). Negative for arthralgias and myalgias.  Skin: Negative for rash and wound.  Neurological: Negative for dizziness, weakness, light-headedness and headaches.  Hematological: Negative for adenopathy.  Psychiatric/Behavioral: Negative for confusion and sleep disturbance. The patient is not nervous/anxious.   All other systems reviewed and are negative.   Physical Exam Constitutional:      General: She is not in acute distress.    Comments: frail appearing, thin  HENT:     Mouth/Throat:     Mouth: Mucous membranes are moist.     Pharynx: Oropharynx is clear.  Eyes:     General: No scleral icterus.    Conjunctiva/sclera: Conjunctivae normal.  Cardiovascular:     Rate and Rhythm: Normal rate and regular rhythm.     Comments: Regular rate and rhythm Pulmonary:     Effort: Pulmonary effort is normal.     Breath sounds: Normal breath sounds.  Abdominal:     Palpations: Abdomen is soft.     Tenderness: There is no abdominal tenderness.  Skin:    General: Skin is warm and dry.     Findings: No rash.  Neurological:     Mental Status: She is alert and oriented to person, place, and time.  Psychiatric:        Mood and Affect:  Mood normal.        Behavior: Behavior normal.    IMPRESSION: I have met this patient previously and reintroduced myself and attempted to establish a therapeutic rapport.  She was previously seen by Faythe Casa, NP on 09/02/2018.  She is seen Dr. Tasia Catchings today as well.  Currently on Lonsurf.  Imaging from August 18, 2018 revealed progression of disease with new and enlarging tumor throughout the liver and new soft tissue densities between stomach and liver.  Beryle Flock was discontinued and she started Lonsurf on 08/31/2018.  Dr. Tasia Catchings has discussed with her that overall prognosis is poor.  Symptomatically, she says that overall she feels well.  She has some pain which is controlled with Tylenol and tramadol.  She becomes fatigued easily and says she "does not do much" around the house and watches TV frequently throughout the day.  She says she is urinating, denies pain but endorses feeling of retained urine.  She has history of recurrent UTIs and multiple medication allergies.    She has not noticed much improvement in leg swelling since starting furosemide.  She said she often forgets to elevate her legs.  Disliked compression socks she says she generally feels fatigued and tired.  Appetite continues to be stable but poor.  She says Megace was increased today and she is followed by dietitian.  We completed a MOST form today. The patient outlined her wishes for the following treatment decisions:  Cardiopulmonary Resuscitation: Do Not Attempt Resuscitation (DNR/No CPR)  Medical Interventions: Comfort Measures: Keep clean, warm, and dry. Use medication by any route, positioning, wound care, and other measures to relieve pain and suffering. Use oxygen, suction and manual treatment of airway obstruction as needed for comfort.  Do not transfer to the hospital unless comfort needs cannot be met in current location.  Antibiotics: Determine use of limitation of antibiotics when infection occurs  IV Fluids: IV fluids for  a defined trial period  Feeding Tube: No feeding tube   Patient retained original copies.  Scanned copy available under media tab and attached below.      PLAN: - MOST form and DNR completed today; patient retained originals; consider scanning into [facility] after clear from covid-19 pandemic - UA today concerning for UTI.  Awaiting C&S results. Multiple medication allergies. Upon treatment would consider prophylaxis and/or topical premarin -Continue to follow-up with home-based palliative care - notify when CARE program re-opens so patient can re-start services.  - Consider 4 wheel rolling walker with seat if increased weakness & fatigue - continue lonsurf and treatment per Dr Tasia Catchings - megace increased per dr. Tasia Catchings - follow up palliative care visit in 2 weeks when she returns for labs  Patient expressed understanding and was in agreement with this plan. She also understands that She can call the clinic at any time with any questions, concerns, or complaints.   Time Total: 45 minutes  Visit consisted of counseling and education dealing with the complex and emotionally intense issues of symptom management and palliative care in the setting of serious and potentially life-threatening illness.Greater than 50%  of this time was spent counseling and coordinating care related to the above assessment and plan.  Beckey Rutter, DNP, AGNP-C [facility] (work cell) [phone] (office)  CC: Dr. Tasia Catchings & Billey Chang, NP

## 2018-09-19 NOTE — Progress Notes (Signed)
Hematology/Oncology Follow Up Note [facility]  [phone] [phone]  Patient Care Team: Tonia Ghent, MD as PCP - General (Family Medicine)   Name of the patient: [name]  [id number]  08-14-1934   REASON FOR VISIT  follow-up for chemotherapy management of esophageal adenocarcinoma,   HISTORY OF PRESENT ILLNESS Patient is a 84-old female who has metastatic esophageal cancer with liver involvement.  Weight loss 20 pounds for the past year prior to diagnosis.  # EGD during admission showed partially obstructive esophageal mass at the distal part of esophagus. Biopsy was taken. Pathology showed esophageal adenocarcinoma. #US biopsy of liver lesion to proof distant metastasis.  Report family history of breast cancer, but she has been having routine mammograms.  # Lives alone. She's a former smoker.  # Molecular Testing:  #[employer] Cdx: No reportable alternations with companion diagnostic (CDx) claims.  MS stable Tumor mutation burden: Cannot be determined, CCND3 amplification: Equivocal.  Amended reports on 06/04/2017, Tumor mutational burden changed from "can not be determined" to "TMB -low" on the re-analyzed samples.  Tumor Proportion Score [TPS] 20%. # HER 2 negative.   Antineoplasm Treatments S/p 6 cycles of FOLFOX, PET scan showed excellent partial response from both chemotherapy and radiation, result was discussed with patient.  Patient was switched to 5-FU maintenance and to September 2019. S/p palliative radiation in January 2019 # April 2019 Developed esophageal stricture secondary to radiation in April and got dilation via endoscopy. EGD shoed Benign-appearing esophageal stenosis. This is a result of radiation therapy.- LA Grade C radiation esophagitis. - The previously noted esophageal mass in Nov 2018 was much decreased indicating good response to radiation/chemotherapy. - Normal stomach.- Normal examined jejunum.-  No specimens collected.   #01/23/2018 PET scan showed interval development of loculated ascites within the upper abdomen with overlies the liver and the spleen and exhibited increased radio tracer uptake.  Worrisome for peritoneal carcinomatosis.  Soft tissue infiltration within the omentum with increased radiotracer uptake noted.  Worrisome for peritoneal carcinomatosis.  New hypermetabolic internal mammary lymph nodes and a right CP angle lymph node.  Suspicious for metastatic adenopathy.  Mild FDG uptake is identified localizing to the lower third of the esophagus raising SUV max of 5.15.  # 01/26/2018 2nd line treatment Taxol and Cyramza  12/31/20219  CT chest abdomen pelvis unfortunately showed mixed response. # 05/27/2018 3nd line treatment with pembrolizumab.  07/21/2018 MRI of brain showed a subacute stroke. Patient was advised to restart on aspirin 81 mg.  And to follow-up with primary care physician. Patient has been seen by PCP and had a discussion about subacute stroke.  Patient stopped using aspirin because of increased bruising.  Decision was made to take aspirin 81 mg every other day.  Statin was not started given patient's metastatic esophageal cancer setting and a limited life span.  Metoprolol was discontinued due to low blood pressure.  # 08/18/2018 CT chest abdomen pelvis showed progression.  08/31/2018 started on oral chemotherapy Lonsurf 35 mg/m BID, day 1-5, and day 8-12 every 28 cycles.  INTERVAL HISTORY  84 y.o. female with history of metastatic esophageal cancer present for evaluation while on oral chemotherapy LONGSURF for the treatment of esophageal cancer.  #Patient denies any pain today.  She uses tramadol if needed.  Pain level 1 out of 10 today. #Continue to lose weight, she lost about 3 pounds since last visit 4 weeks ago. Appetite is fair.  She uses Marinol 5 mg twice daily as  needed.  Reports frequent urination.  Denies any burning sensations, fever, chills,  confusion, nausea, vomiting, diarrhea abdominal pain. She denies any swallowing difficulties. Chronic mild shortness of breath with exertion, no exacerbation or alleviating factors. Chronic lower extremity swelling, uses Lasix 20 mg 1 to 2 tablets as needed for edema.     Review of Systems  Constitutional: Positive for appetite change, fatigue and unexpected weight change. Negative for chills and fever.  HENT:   Negative for hearing loss and voice change.   Eyes: Negative for eye problems.  Respiratory: Negative for chest tightness and cough.   Cardiovascular: Positive for leg swelling. Negative for chest pain.  Gastrointestinal: Negative for abdominal distention, abdominal pain and blood in stool.  Endocrine: Negative for hot flashes.  Genitourinary: Positive for frequency. Negative for difficulty urinating and dysuria.   Musculoskeletal: Negative for arthralgias and flank pain.  Skin: Negative for itching and rash.  Neurological: Positive for numbness. Negative for extremity weakness.  Hematological: Negative for adenopathy.  Psychiatric/Behavioral: Negative for confusion.     Allergies  Allergen Reactions   Cefuroxime Nausea Only   Codeine Nausea Only   Doxycycline Other (See Comments)    Lip swelling   Gabapentin Other (See Comments)    Swelling.    Iohexol Itching    07-15-13 pt developed itching on fingers after contrast given. Dr. Irish Elders looked at pt and said to put in system as allergy. BB   Other Other (See Comments)    Contrast Dye- caused fever, chills, awful feeling Bandages - itching, rash   Oxycodone Other (See Comments)    nausea   Quinolones Swelling    Lip swelling   Synvisc [Hylan G-F 20] Swelling     Past Medical History:  Diagnosis Date   Arthritis    Asthma    COPD (chronic obstructive pulmonary disease) (HCC)    Dysphonia    Dyspnea    Esophageal cancer (HCC)    GERD (gastroesophageal reflux disease)    Heart murmur    History  of kidney stones    Hypertension    Hypokalemia 10/07/2017   Melanoma (HCC)    OAB (overactive bladder)    OSA on CPAP    Paresthesia    from neck down after spinal abscess   Personal history of chemotherapy    esophageal cancer   Personal history of radiation therapy    esophageal cancer   Pupil asymmetry    R pupil defect   Skin cancer    Sleep apnea    Spinal cord abscess      Past Surgical History:  Procedure Laterality Date   ABDOMINAL HYSTERECTOMY     ANTERIOR CERVICAL DECOMP/DISCECTOMY FUSION N/A 07/16/2013   Procedure: ANTERIOR CERVICAL DECOMPRESSION/DISCECTOMY FUSION mutlipleLEVELS C4-7;  Surgeon: Erline Levine, MD;  Location: [facility];  Service: Neurosurgery;  Laterality: N/A;   APPENDECTOMY     carpel tunn Bilateral    CATARACT EXTRACTION     CATARACT EXTRACTION, BILATERAL     CHOLECYSTECTOMY     dental implant     ESOPHAGOGASTRODUODENOSCOPY Left 03/21/2017   Procedure: ESOPHAGOGASTRODUODENOSCOPY (EGD);  Surgeon: Virgel Manifold, MD;  Location: [facility];  Service: Endoscopy;  Laterality: Left;   ESOPHAGOGASTRODUODENOSCOPY (EGD) WITH PROPOFOL N/A 08/20/2017   Procedure: ESOPHAGOGASTRODUODENOSCOPY (EGD) WITH PROPOFOL;  Surgeon: Virgel Manifold, MD;  Location: [facility] ENDOSCOPY;  Service: Endoscopy;  Laterality: N/A;   ESOPHAGOGASTRODUODENOSCOPY (EGD) WITH PROPOFOL N/A 09/19/2017   Procedure: ESOPHAGOGASTRODUODENOSCOPY (EGD) WITH PROPOFOL;  Surgeon: [name]  B, MD;  Location: [facility] ENDOSCOPY;  Service: Endoscopy;  Laterality: N/A;   ESOPHAGOGASTRODUODENOSCOPY (EGD) WITH PROPOFOL N/A 10/03/2017   Procedure: ESOPHAGOGASTRODUODENOSCOPY (EGD) WITH PROPOFOL;  Surgeon: Virgel Manifold, MD;  Location: [facility] ENDOSCOPY;  Service: Endoscopy;  Laterality: N/A;   ESOPHAGOGASTRODUODENOSCOPY (EGD) WITH PROPOFOL N/A 10/22/2017   Procedure: ESOPHAGOGASTRODUODENOSCOPY (EGD) WITH PROPOFOL;  Surgeon: Virgel Manifold, MD;  Location: [facility]  ENDOSCOPY;  Service: Endoscopy;  Laterality: N/A;   ESOPHAGOGASTRODUODENOSCOPY (EGD) WITH PROPOFOL N/A 11/18/2017   Procedure: ESOPHAGOGASTRODUODENOSCOPY (EGD) WITH PROPOFOL;  Surgeon: Virgel Manifold, MD;  Location: [facility] ENDOSCOPY;  Service: Endoscopy;  Laterality: N/A;   ESOPHAGOGASTRODUODENOSCOPY (EGD) WITH PROPOFOL N/A 12/17/2017   Procedure: ESOPHAGOGASTRODUODENOSCOPY (EGD) WITH PROPOFOL;  Surgeon: Virgel Manifold, MD;  Location: [facility] ENDOSCOPY;  Service: Endoscopy;  Laterality: N/A;   ESOPHAGOGASTRODUODENOSCOPY (EGD) WITH PROPOFOL N/A 01/08/2018   Procedure: ESOPHAGOGASTRODUODENOSCOPY (EGD) WITH PROPOFOL;  Surgeon: Virgel Manifold, MD;  Location: [facility] ENDOSCOPY;  Service: Endoscopy;  Laterality: N/A;   ESOPHAGOGASTRODUODENOSCOPY (EGD) WITH PROPOFOL N/A 01/22/2018   Procedure: ESOPHAGOGASTRODUODENOSCOPY (EGD) WITH PROPOFOL;  Surgeon: Virgel Manifold, MD;  Location: [facility] ENDOSCOPY;  Service: Endoscopy;  Laterality: N/A;   ESOPHAGOGASTRODUODENOSCOPY (EGD) WITH PROPOFOL N/A 07/15/2018   Procedure: ESOPHAGOGASTRODUODENOSCOPY (EGD) WITH PROPOFOL;  Surgeon: Virgel Manifold, MD;  Location: [facility] ENDOSCOPY;  Service: Endoscopy;  Laterality: N/A;   EYE SURGERY     GASTRIC BYPASS     HERNIA REPAIR     HIATAL HERNIA REPAIR     JOINT REPLACEMENT Bilateral    knee   MELANOMA EXCISION     PORTA CATH INSERTION N/A 04/07/2017   Procedure: PORTA CATH INSERTION;  Surgeon: Algernon Huxley, MD;  Location: [place] [facility];  Service: Cardiovascular;  Laterality: N/A;   POSTERIOR CERVICAL FUSION/FORAMINOTOMY N/A 07/28/2013   Procedure: Cervical four-seven  posterior cervical fusion;  Surgeon: Erline Levine, MD;  Location: [place] NEUR[facility] ORS;  Service: Neurosurgery;  Laterality: N/A;   TONSILLECTOMY     TOTAL KNEE ARTHROPLASTY      Social History   Socioeconomic History   Marital status: Widowed    Spouse name: Not on file   Number of children: 2   Years of education: Not on  file   Highest education level: Master's degree (e.g., MA, MS, MEng, MEd, MSW, MBA)  Occupational History   Not on file  Social Needs   Financial resource strain: Not hard at all   Food insecurity:    Worry: Never true    Inability: Never true   Transportation needs:    Medical: No    Non-medical: No  Tobacco Use   Smoking status: Former Smoker    Packs/day: 1.00    Years: 20.00    Pack years: 20.00    Types: Cigarettes    Last attempt to quit: 1977    Years since quitting: 43.3   Smokeless tobacco: Never Used  Substance and Sexual Activity   Alcohol use: No   Drug use: Never   Sexual activity: Not Currently  Lifestyle   Physical activity:    Days per week: 2 days    Minutes per session: 30 min   Stress: Not at all  Relationships   Social connections:    Talks on phone: More than three times a week    Gets together: More than three times a week    Attends religious service: More than 4 times per year    Active member of club or organization: Yes  Attends meetings of clubs or organizations: More than 4 times per year    Relationship status: Widowed   Intimate partner violence:    Fear of current or ex partner: No    Emotionally abused: No    Physically abused: No    Forced sexual activity: No  Other Topics Concern   Not on file  Social History Narrative   Marital Status- Widowed    Lives by herself    Architectural technologist- Retired Pharmacist, hospital   Exercise hx- Does PT     Family History  Problem Relation Age of Onset   Breast cancer Mother 32   Arthritis-Osteo Mother    Breast cancer Sister 65   Bladder Cancer Sister    Bladder Cancer Father    Tongue cancer Father    Congenital heart disease Father    Prostate cancer Brother    Uterine cancer Maternal Aunt    Lung cancer Paternal Uncle    Leukemia Maternal Grandmother    Liver cancer Paternal Grandmother    Colon cancer Neg Hx      Current Outpatient Medications:    albuterol  (PROVENTIL HFA;VENTOLIN HFA) 108 (90 Base) MCG/ACT inhaler, Inhale 2 puffs into the lungs every 4 (four) hours as needed for wheezing or shortness of breath., Disp: , Rfl:    aspirin EC 81 MG tablet, Take 1 tablet (81 mg total) by mouth every other day., Disp: , Rfl:    calcium carbonate (TUMS EX) 750 MG chewable tablet, Chew 1 tablet by mouth as needed for heartburn. , Disp: , Rfl:    chlorhexidine (PERIDEX) 0.12 % solution, Use as directed 15 mLs in the mouth or throat 2 (two) times daily., Disp: , Rfl:    Cholecalciferol (VITAMIN D) 2000 UNITS CAPS, Take 2,000 Units by mouth daily., Disp: , Rfl:    dexamethasone (DECADRON) 4 MG tablet, Take 0.5 tablets (2 mg total) by mouth daily., Disp: 30 tablet, Rfl: 0   Diclofenac Sodium 1 % CREA, Apply to left arm as needed for pain three times daily, Disp: 120 g, Rfl: 0   fluticasone (FLONASE) 50 MCG/ACT nasal spray, Place 1-2 sprays into both nostrils daily., Disp: 16 g, Rfl: 5   furosemide (LASIX) 20 MG tablet, TAKE 1-2 TABLETS BY MOUTH DAILY AS NEEDED FOR EDEMA, Disp: 180 tablet, Rfl: 1   hydrocortisone cream 1 %, Apply 1 application topically daily as needed for itching. , Disp: , Rfl:    loratadine (CLARITIN) 10 MG tablet, Take 10 mg by mouth daily., Disp: , Rfl:    Multiple Vitamins-Minerals (MULTIVITAMIN ADULT) CHEW, Chew 1 tablet by mouth daily. , Disp: , Rfl:    MYRBETRIQ 25 MG TB24 tablet, , Disp: , Rfl:    oxybutynin (DITROPAN) 5 MG tablet, TAKE ONE TABLET BY MOUTH EVERY MORNING AND TAKE ONE-HALF TABLET AT BEDTIME, Disp: 45 tablet, Rfl: 3   pantoprazole (PROTONIX) 40 MG tablet, TAKE 1 TABLET BY MOUTH TWICE DAILY BEFORE A MEAL, Disp: 60 tablet, Rfl: 3   Polyethyl Glycol-Propyl Glycol (SYSTANE OP), Apply 1 drop to eye daily as needed (dry eyes)., Disp: , Rfl:    potassium chloride (KLOR-CON) 20 MEQ packet, Take 20 mEq by mouth daily., Disp: 30 packet, Rfl: 1   pregabalin (LYRICA) 150 MG capsule, TAKE 1 CAPSULE TWICE DAILY, Disp: 60  capsule, Rfl: 3   pyridOXINE (VITAMIN B-6) 100 MG tablet, Take 1 tablet (100 mg total) by mouth daily., Disp: 30 tablet, Rfl: 3   SENNA-PLUS 8.6-50 MG tablet, TAKE  2 TABLETS BY MOUTH DAILY, Disp: 60 tablet, Rfl: 0   sulfamethoxazole-trimethoprim (BACTRIM) 400-80 MG tablet, Take 1 tablet by mouth 2 (two) times daily., Disp: 10 tablet, Rfl: 0   traZODone (DESYREL) 50 MG tablet, Take 0.5-1 tablets (25-50 mg total) by mouth at bedtime as needed for sleep., Disp: , Rfl:    trifluridine-tipiracil (LONSURF) 15-6.14 MG tablet, Take 2 tablets by mouth two (2) times daily after a meal. Take with two 42m tablets. Take only on days 1-5, 8-12. Repeat every 28days., Disp: 40 tablet, Rfl: 3   trifluridine-tipiracil (LONSURF) 20-8.19 MG tablet, Take 2 tablets by mouth two (2) times daily after a meal. Take with two 143mtablets. Take only on days 1-5, 8-12. Repeat every 28days., Disp: 40 tablet, Rfl: 3   dronabinol (MARINOL) 5 MG capsule, Take 1 capsule (5 mg total) by mouth 4 (four) times daily., Disp: 120 capsule, Rfl: 0   traMADol (ULTRAM) 50 MG tablet, Take 1 tablet (50 mg total) by mouth every 6 (six) hours as needed for up to 30 days., Disp: 120 tablet, Rfl: 0 No current facility-administered medications for this visit.   Facility-Administered Medications Ordered in Other Visits:    sodium chloride flush (NS) 0.9 % injection 10 mL, 10 mL, Intravenous, PRN, YuEarlie ServerMD, 10 mL at 01/26/18 0904  Physical exam:  Vitals:   09/18/18 0941  BP: 110/72  Pulse: 78  Temp: (!) 97.3 F (36.3 C)  Weight: 190 lb 1.6 oz (86.2 kg)  ECOG 2 Physical Exam  Constitutional: She is oriented to person, place, and time. No distress.  HENT:  Head: Normocephalic and atraumatic.  Nose: Nose normal.  Mouth/Throat: Oropharynx is clear and moist. No oropharyngeal exudate.  Eyes: Pupils are equal, round, and reactive to light. Conjunctivae and EOM are normal. No scleral icterus.  Neck: Normal range of motion. Neck  supple. No JVD present.  Cardiovascular: Normal rate and regular rhythm.  Murmur heard. Pulmonary/Chest: Effort normal and breath sounds normal. No respiratory distress. She has no wheezes. She has no rales. She exhibits no tenderness.  Abdominal: Soft. Bowel sounds are normal. She exhibits no distension. There is no abdominal tenderness.  Musculoskeletal: Normal range of motion.        General: Edema present. No tenderness or deformity.  Lymphadenopathy:    She has no cervical adenopathy.  Neurological: She is alert and oriented to person, place, and time. No cranial nerve deficit. She exhibits normal muscle tone. Coordination normal.  Skin: Skin is warm and dry. She is not diaphoretic. No erythema.  Psychiatric: Affect and judgment normal.    CMP Latest Ref Rng & Units 09/18/2018  Glucose 70 - 99 mg/dL 95  BUN 8 - 23 mg/dL 17  Creatinine 0.44 - 1.00 mg/dL 0.95  Sodium 135 - 145 mmol/L 139  Potassium 3.5 - 5.1 mmol/L 3.7  Chloride 98 - 111 mmol/L 100  CO2 22 - 32 mmol/L 30  Calcium 8.9 - 10.3 mg/dL 8.6(L)  Total Protein 6.5 - 8.1 g/dL 6.8  Total Bilirubin 0.3 - 1.2 mg/dL 0.8  Alkaline Phos 38 - 126 U/L 126  AST 15 - 41 U/L 48(H)  ALT 0 - 44 U/L 21   CBC Latest Ref Rng & Units 09/18/2018  WBC 4.0 - 10.5 K/uL 3.6(L)  Hemoglobin 12.0 - 15.0 g/dL 7.9(L)  Hematocrit 36.0 - 46.0 % 26.6(L)  Platelets 150 - 400 K/uL 179   RADIOGRAPHIC STUDIES: I have personally reviewed the radiological images as listed and agreed  with the findings in the report.  PET scan 01/23/2018  Interval development of loculated ascites within the upper abdomen which overlies the liver and spleen and exhibits increased radiotracer uptake. Findings worrisome for peritoneal carcinomatosis. Consider further evaluation with diagnostic paracentesis. 2. Soft tissue infiltration within the omentum with increased radiotracer uptake noted. Also worrisome for peritoneal carcinomatosis. 3. New hypermetabolic internal mammary  lymph nodes and right CP angle lymph node. Suspicious for metastatic adenopathy. 4. Mild FDG uptake is identified localizing to the lower third of the esophagus within SUV max of 5.15.  Bone Scan 05/08/2018  1. Several small foci of activity in anterior ribs as noted above, and metastatic involvement can not be excluded. 2. Somewhat mottled activity within the midthoracic and upper lumbar spine and metastatic involvement can not be excluded. Correlate with plain films or MRI preferably.  08/18/2018 CT chest abdomen pelvis with contrast 1. New and enlarging tumors throughout the liver compatible with progressive malignancy. 2. New 2.0 by 3.1 cm soft tissue density between the stomach in the liver, probably a malignant deposit/lymph node. 3. Stable mild circumferential wall thickening in the distal esophagus. Prior gastric bypass. 4. Other imaging findings of potential clinical significance: Aortic Atherosclerosis (ICD10-I70.0). Coronary atherosclerosis. Mild cardiomegaly. Aortic and mitral valve calcifications. Mild increase in right middle lobe atelectasis without obvious tumor in the right middle lobe. Scarring or atelectasis in the lung bases. New trace left pleural effusion. Thoracolumbar spondylosis and degenerative disc disease. Non-specific 3 mm hypodense lesion in the spleen. Stable left kidney lower pole staghorn calculus, nonobstructive. Cutaneous thickening in the right lower quadrant anterior abdominal wall, possibly from low-grade panniculitis.  Assessment and plan  Cancer Staging Malignant neoplasm of lower third of esophagus [facility]) Staging form: Esophagus - Adenocarcinoma, AJCC 8th Edition - Clinical stage from 04/10/2017: Stage IVB (cTX, cNX, pM1) - Signed by Earlie Server, MD on 04/10/2017  1. Esophageal adenocarcinoma ([place])   2. Encounter for antineoplastic chemotherapy   3. Port-A-Cath in place   4. Anemia due to antineoplastic chemotherapy   5. Weight loss   6. Acute cystitis  without hematuria    #Esophageal cancer, metastatic Currently on third line chemotherapy treatment with Lonsurf. CEA is not a marker anymore.  Tolerating well. Recommend continue current loss of regimen.  #Weight loss, poor appetite.  Increase Marinol to 5 mg 4 times daily.  Continue follow-up with dietitian. Continue Ensure supplementation.  #Anemia secondary to cancer and chemotherapy.  Hemoglobin 7.9.  Continue to monitor. #Recurrent UTI Patient has increased urinary frequency. Urine analysis showed positive nitrates. Culture showed more than 100,000 colony Klebsiella pneumonia.  Awaiting susceptibilities. Recommend patient to start empiric Bactrim DS BID for 5 days.  Prescription was called in to pharmacy and the patient is aware.  Consider prophylactic antibiotics given multiple UTI in the future.   #Depression/insomnia, continue trazodone 50 mg nightly. #Bone metastasis, discussed with patient about obtaining dental clearance for Zometa use. #Constipation, Continue  Senokot 2 tabs daily. # neoplasm related pain, continue Tramadol 35m Q6 hours as needed. Refill sent to pharmacy.  # Port A cath in place, flush Q6 weeks.   Follow-up 2  weeks   ZEarlie Server MD, PhD

## 2018-09-20 ENCOUNTER — Encounter: Payer: Self-pay | Admitting: Oncology

## 2018-09-20 LAB — URINE CULTURE: Culture: >=100000 — AB

## 2018-09-20 MED ORDER — SULFAMETHOXAZOLE-TRIMETHOPRIM 400-80 MG PO TABS: 1.0000 | ORAL_TABLET | Freq: Two times a day (BID) | ORAL | 0 refills | Status: DC

## 2018-09-21 ENCOUNTER — Telehealth: Payer: Self-pay | Admitting: Nurse Practitioner

## 2018-09-21 NOTE — Telephone Encounter (Signed)
Called patient with results of UA and culture which was positive for klebsiella. Dr. Tasia Catchings has already sent in prescription for bactrim. Patient has not yet picked that up but plans to do so today. We discussed that if she tolerates bactrim ok, history of multiple drug allergies/side effects, then we would consider prophylaxis following treatment (1/2 tablet daily). She said that she'll call clinic either tomorrow or Wednesday to let me know how she is tolerating bactrim and can consider sending prophylaxis at that time is so.

## 2018-09-23 ENCOUNTER — Ambulatory Visit (INDEPENDENT_AMBULATORY_CARE_PROVIDER_SITE_OTHER): Payer: Medicare Other | Admitting: Gastroenterology

## 2018-09-23 ENCOUNTER — Encounter: Payer: Self-pay | Admitting: Gastroenterology

## 2018-09-23 DIAGNOSIS — K222 Esophageal obstruction: Secondary | ICD-10-CM | POA: Diagnosis not present

## 2018-09-23 NOTE — Progress Notes (Signed)
[name], MD [address]  [place]  [place], [place] [address]  Main: [phone]  Fax: [phone]   Primary Care Physician: Tonia Ghent, MD  Virtual Visit via Telephone Note  I connected with patient on 09/23/18 at 10:30 AM EDT by telephone and verified that I am speaking with the correct person using two identifiers.   I discussed the limitations, risks, security and privacy concerns of performing an evaluation and management service by telephone and the availability of in person appointments. I also discussed with the patient that there may be a patient responsible charge related to this service. The patient expressed understanding and agreed to proceed.  Location of Patient: Home Location of Provider: Home Persons involved: Patient and provider only   History of Present Illness: Chief Complaint  Patient presents with  . Follow-up    esophageal stricture     HPI: [name] is a 84 y.o. female with history of metastatic esophageal adenocarcinoma, with radiation-induced esophageal stricture here for follow-up.  Patient following with oncology with evidence of bone and liver mets and undergoing chemotherapy.  Patient had multiple rounds of dilation due to tight esophageal stricture despite steroid injections in the area.  However, stricture is now resolved and last dilation was done March 2020 with dilation done to 19 mm at the time.  Patient denies any further dysphagia.  States does not have to think about what she is eating and eats everything without any dysphagia or odynophagia.  No abdominal pain.  No diarrhea.  Current Outpatient Medications  Medication Sig Dispense Refill  . albuterol (PROVENTIL HFA;VENTOLIN HFA) 108 (90 Base) MCG/ACT inhaler Inhale 2 puffs into the lungs every 4 (four) hours as needed for wheezing or shortness of breath.    Marland Kitchen aspirin EC 81 MG tablet Take 1 tablet (81 mg total) by mouth every other day.    . calcium  carbonate (TUMS EX) 750 MG chewable tablet Chew 1 tablet by mouth as needed for heartburn.     . Cholecalciferol (VITAMIN D) 2000 UNITS CAPS Take 2,000 Units by mouth daily.    Marland Kitchen dexamethasone (DECADRON) 4 MG tablet Take 0.5 tablets (2 mg total) by mouth daily. 30 tablet 0  . dronabinol (MARINOL) 5 MG capsule Take 1 capsule (5 mg total) by mouth 4 (four) times daily. 120 capsule 0  . fluticasone (FLONASE) 50 MCG/ACT nasal spray Place 1-2 sprays into both nostrils daily. 16 g 5  . furosemide (LASIX) 20 MG tablet TAKE 1-2 TABLETS BY MOUTH DAILY AS NEEDED FOR EDEMA 180 tablet 1  . hydrocortisone cream 1 % Apply 1 application topically daily as needed for itching.     . loratadine (CLARITIN) 10 MG tablet Take 10 mg by mouth daily.    . Multiple Vitamins-Minerals (MULTIVITAMIN ADULT) CHEW Chew 1 tablet by mouth daily.     Marland Kitchen MYRBETRIQ 25 MG TB24 tablet     . oxybutynin (DITROPAN) 5 MG tablet TAKE ONE TABLET BY MOUTH EVERY MORNING AND TAKE ONE-HALF TABLET AT BEDTIME 45 tablet 3  . pantoprazole (PROTONIX) 40 MG tablet TAKE 1 TABLET BY MOUTH TWICE DAILY BEFORE A MEAL 60 tablet 3  . Polyethyl Glycol-Propyl Glycol (SYSTANE OP) Apply 1 drop to eye daily as needed (dry eyes).    . potassium chloride (KLOR-CON) 20 MEQ packet Take 20 mEq by mouth daily. 30 packet 1  . pregabalin (LYRICA) 150 MG capsule TAKE 1 CAPSULE TWICE DAILY 60 capsule 3  . pyridOXINE (VITAMIN  B-6) 100 MG tablet Take 1 tablet (100 mg total) by mouth daily. 30 tablet 3  . SENNA-PLUS 8.6-50 MG tablet TAKE 2 TABLETS BY MOUTH DAILY 60 tablet 0  . sulfamethoxazole-trimethoprim (BACTRIM) 400-80 MG tablet Take 1 tablet by mouth 2 (two) times daily. 10 tablet 0  . traMADol (ULTRAM) 50 MG tablet Take 1 tablet (50 mg total) by mouth every 6 (six) hours as needed for up to 30 days. 120 tablet 0  . traZODone (DESYREL) 50 MG tablet Take 0.5-1 tablets (25-50 mg total) by mouth at bedtime as needed for sleep.    Marland Kitchen trifluridine-tipiracil (LONSURF) 15-6.14  MG tablet Take 2 tablets by mouth two (2) times daily after a meal. Take with two 20mg  tablets. Take only on days 1-5, 8-12. Repeat every 28days. 40 tablet 3  . trifluridine-tipiracil (LONSURF) 20-8.19 MG tablet Take 2 tablets by mouth two (2) times daily after a meal. Take with two 15mg  tablets. Take only on days 1-5, 8-12. Repeat every 28days. 40 tablet 3  . chlorhexidine (PERIDEX) 0.12 % solution Use as directed 15 mLs in the mouth or throat 2 (two) times daily.    . Diclofenac Sodium 1 % CREA Apply to left arm as needed for pain three times daily (Patient not taking: Reported on 09/23/2018) 120 g 0   No current facility-administered medications for this visit.    Facility-Administered Medications Ordered in Other Visits  Medication Dose Route Frequency Provider Last Rate Last Dose  . sodium chloride flush (NS) 0.9 % injection 10 mL  10 mL Intravenous PRN Earlie Server, MD   10 mL at 01/26/18 0904    Allergies as of 09/23/2018 - Review Complete 09/23/2018  Allergen Reaction Noted  . Cefuroxime Nausea Only 07/11/2015  . Codeine Nausea Only 01/22/2011  . Doxycycline Other (See Comments) 12/03/2013  . Gabapentin Other (See Comments) 12/19/2016  . Iohexol Itching 07/15/2013  . Other Other (See Comments) 12/03/2013  . Oxycodone Other (See Comments) 12/19/2016  . Quinolones Swelling 12/03/2013  . Synvisc [hylan g-f 20] Swelling 01/22/2011    Review of Systems:    All systems reviewed and negative except where noted in HPI.   Observations/Objective:  Labs: CMP     Component Value Date/Time   NA 139 09/18/2018 0920   NA 138 07/13/2013 0609   K 3.7 09/18/2018 0920   K 4.1 07/13/2013 0609   CL 100 09/18/2018 0920   CL 104 07/13/2013 0609   CO2 30 09/18/2018 0920   CO2 29 07/13/2013 0609   GLUCOSE 95 09/18/2018 0920   GLUCOSE 115 (H) 07/13/2013 0609   BUN 17 09/18/2018 0920   BUN 15 07/13/2013 0609   CREATININE 0.95 09/18/2018 0920   CREATININE 0.67 07/13/2013 0609   CALCIUM 8.6 (L)  09/18/2018 0920   CALCIUM 8.3 (L) 07/13/2013 0609   PROT 6.8 09/18/2018 0920   ALBUMIN 3.2 (L) 09/18/2018 0920   AST 48 (H) 09/18/2018 0920   ALT 21 09/18/2018 0920   ALKPHOS 126 09/18/2018 0920   BILITOT 0.8 09/18/2018 0920   GFRNONAA 56 (L) 09/18/2018 0920   GFRNONAA >60 07/13/2013 0609   GFRAA >60 09/18/2018 0920   GFRAA >60 07/13/2013 0609   Lab Results  Component Value Date   WBC 3.6 (L) 09/18/2018   HGB 7.9 (L) 09/18/2018   HCT 26.6 (L) 09/18/2018   MCV 99.6 09/18/2018   PLT 179 09/18/2018    Imaging Studies: No results found.  Assessment and Plan:   [name]  is a 84 y.o. y/o female with radiation-induced esophageal stricture, with significant symptomatic improvement with previous dilations and no further dysphagia  Assessment and Plan: No further needs for esophageal stenting given that dysphagia has completely resolved and need for repeated dilations has subsided dramatically  If dysphagia reoccurs or if patient has odynophagia, I have asked her to notify us immediately and she verbalized understanding  Continue follow-up with oncology for metastatic adenocarcinoma  Follow Up Instructions: Follow-up in 1 year or earlier if needed   I discussed the assessment and treatment plan with the patient. The patient was provided an opportunity to ask questions and all were answered. The patient agreed with the plan and demonstrated an understanding of the instructions.   The patient was advised to call back or seek an in-person evaluation if the symptoms worsen or if the condition fails to improve as anticipated.  I provided 15 minutes of non-face-to-face time during this encounter.   Virgel Manifold, MD  Speech recognition software was used to dictate this note.

## 2018-09-25 ENCOUNTER — Other Ambulatory Visit: Payer: Self-pay

## 2018-09-28 ENCOUNTER — Inpatient Hospital Stay: Payer: Medicare Other

## 2018-09-28 IMAGING — CT NM PET TUM IMG INITIAL (PI) SKULL BASE T - THIGH
1 of 8 series · 1 of 25 positions shown · non-contrast
Comparison: CT abdomen 03/20/2017

CLINICAL DATA: Initial Treatment strategy for esophageal cancer.

EXAM:
NUCLEAR MEDICINE PET SKULL BASE TO THIGH
TECHNIQUE: 12.2 mCi F-18 FDG was injected intravenously. Full-ring PET imaging
was performed from the skull base to thigh after the radiotracer. CT
data was obtained and used for attenuation correction and anatomic
localization.
FASTING BLOOD GLUCOSE:  Value: 101 mg/dl

[Series 3: ct wb 5.0 b30f · axial · 5.0mm · 0.98mm/px · 1 of 290 slices shown]
[im 290/290  brain]
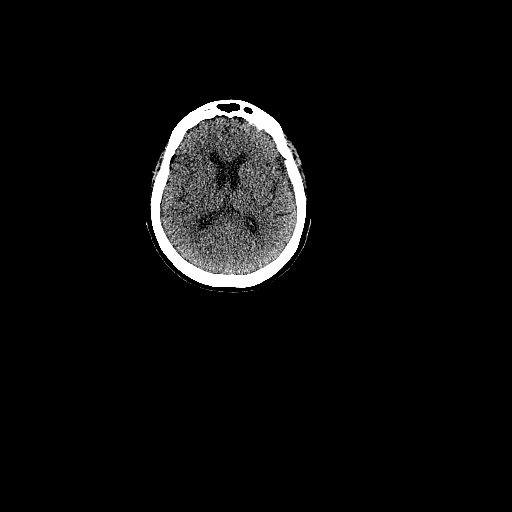

[1 of 25 positions shown; findings below may reference images not displayed]

FINDINGS: NECK

No hypermetabolic lymph nodes in the neck.

CHEST

Distal esophageal and gastric cardia mass, maximum SUV 12.7, with
associated wall thickening. No other abnormal hypermetabolic
activity in the chest.

Mosaic attenuation in the lungs potentially from extrinsic allergic
alveolitis or air trapping. Mild cardiomegaly. Coronary, aortic
arch, and branch vessel atherosclerotic vascular disease. Small
paratracheal lymph nodes are not hypermetabolic or pathologically
enlarged by size criteria.

ABDOMEN/PELVIS

Hypermetabolic gastroesophageal mass as noted above. Hypermetabolic
right gastric and gastrohepatic ligament adenopathy, with a dominant
right gastric lymph node measuring 1.4 cm in short axis on image
137/3 with maximum SUV 14.4.

There scattered metastatic lesions throughout the liver potentially
sparing segment 2 and the caudate lobe but otherwise discretely
identified in all segments. A dominant lesion in the right hepatic
lobe has a maximum SUV of 14.0 and with abnormal accentuated
metabolic activity measuring 6.0 by 3.8 cm.

Aortoiliac atherosclerotic vascular disease. Postoperative findings
in the stomach. Staghorn calculus in the left kidney lower pole
measuring 1.7 cm in anterior-posterior axis.

SKELETON

No focal hypermetabolic activity to suggest skeletal metastasis.

Lower lumbar degenerative facet arthropathy. Thoracolumbar
spondylosis. Degenerative left sternoclavicular arthropathy. Lower
cervical fusion. Pelvic floor laxity.
IMPRESSION: 1. Hypermetabolic mass of the gastroesophageal junction with
adjacent right gastric hypermetabolic adenopathy and numerous
hypermetabolic hepatic masses involving almost all segments of the
liver.
2. Other imaging findings of potential clinical significance: Mosaic
attenuation in the lungs, query air trapping or extrinsic allergic
alveolitis. Mild cardiomegaly. Aortic Atherosclerosis ([id number]).
Coronary atherosclerosis. Postoperative findings in the stomach.
Left kidney lower pole staghorn calculus. Pelvic floor laxity.

## 2018-09-28 NOTE — Progress Notes (Signed)
Nutrition Follow-up:  RD working remotely.  Patient with metastatic adenocarcinoma currently on 3rd line therapy of lonsurf.   Noted Palliative care following.   Spoke with patient via phone for nutrition follow-up.  Patient reports that she still struggles with no appetite.  "I could sit here all day and not get hungry."  Reports that she continues to have taste alterations.  Patient reports that she does not like ensure/boost shakes but still tries to drink some, not daily to make her children happy.  Reports that they make her want to throw up.  Reports that she is consistently eating yogurt (90 calorie) with fruit for breakfast. Lunch yesterday was tomato juice and peanut butter crackers and supper last night was cheeseburger(ate about 1/2 of it).  Reports she tried to eat few bites this am and couldn't eat anymore.  Reports that she was not nauseated.  Reports that she does not have issues with dysphagia.     Medications: noted marinol increased  Labs: reviewed  Anthropometrics:   Weight 190 lb 1.6 oz on 5/8 decreased from 193 lb on 4/8.   NUTRITION DIAGNOSIS: Inadequate oral intake continues   INTERVENTION:  Patient unsure if she received taste change information via email.  Reports she does not really check email on regular basis.  Will mail handout and send cookbook with new recipes for patient to review and get new inspiration of foods to try.   Encouraged high calorie, high protein foods at every meal and discussed ways to add them.    MONITORING, EVALUATION, GOAL: weight trends, intake   NEXT VISIT: phone follow-up, Monday, July 13th.   [name] B. Zenia Resides, [place], [place] Registered Dietitian [phone] (pager)

## 2018-10-01 ENCOUNTER — Inpatient Hospital Stay (HOSPITAL_BASED_OUTPATIENT_CLINIC_OR_DEPARTMENT_OTHER): Payer: Medicare Other | Admitting: Oncology

## 2018-10-01 ENCOUNTER — Other Ambulatory Visit: Payer: Self-pay

## 2018-10-01 ENCOUNTER — Inpatient Hospital Stay: Payer: Medicare Other

## 2018-10-01 ENCOUNTER — Inpatient Hospital Stay (HOSPITAL_BASED_OUTPATIENT_CLINIC_OR_DEPARTMENT_OTHER): Payer: Medicare Other | Admitting: Hospice and Palliative Medicine

## 2018-10-01 VITALS — BP 110/70 | HR 78 | Temp 97.9°F | Wt 186.5 lb

## 2018-10-01 VITALS — BP 110/70 | HR 78 | Temp 97.9°F | Wt 186.0 lb

## 2018-10-01 DIAGNOSIS — I350 Nonrheumatic aortic (valve) stenosis: Secondary | ICD-10-CM

## 2018-10-01 DIAGNOSIS — G4733 Obstructive sleep apnea (adult) (pediatric): Secondary | ICD-10-CM

## 2018-10-01 DIAGNOSIS — C787 Secondary malignant neoplasm of liver and intrahepatic bile duct: Secondary | ICD-10-CM

## 2018-10-01 DIAGNOSIS — T451X5S Adverse effect of antineoplastic and immunosuppressive drugs, sequela: Secondary | ICD-10-CM

## 2018-10-01 DIAGNOSIS — Z923 Personal history of irradiation: Secondary | ICD-10-CM | POA: Diagnosis not present

## 2018-10-01 DIAGNOSIS — I10 Essential (primary) hypertension: Secondary | ICD-10-CM

## 2018-10-01 DIAGNOSIS — Z5111 Encounter for antineoplastic chemotherapy: Secondary | ICD-10-CM

## 2018-10-01 DIAGNOSIS — R531 Weakness: Secondary | ICD-10-CM

## 2018-10-01 DIAGNOSIS — Z87891 Personal history of nicotine dependence: Secondary | ICD-10-CM

## 2018-10-01 DIAGNOSIS — I2510 Atherosclerotic heart disease of native coronary artery without angina pectoris: Secondary | ICD-10-CM

## 2018-10-01 DIAGNOSIS — Z85820 Personal history of malignant melanoma of skin: Secondary | ICD-10-CM

## 2018-10-01 DIAGNOSIS — Z7982 Long term (current) use of aspirin: Secondary | ICD-10-CM

## 2018-10-01 DIAGNOSIS — Z9221 Personal history of antineoplastic chemotherapy: Secondary | ICD-10-CM

## 2018-10-01 DIAGNOSIS — Z87442 Personal history of urinary calculi: Secondary | ICD-10-CM

## 2018-10-01 DIAGNOSIS — F3289 Other specified depressive episodes: Secondary | ICD-10-CM

## 2018-10-01 DIAGNOSIS — K219 Gastro-esophageal reflux disease without esophagitis: Secondary | ICD-10-CM

## 2018-10-01 DIAGNOSIS — C7951 Secondary malignant neoplasm of bone: Secondary | ICD-10-CM

## 2018-10-01 DIAGNOSIS — Z66 Do not resuscitate: Secondary | ICD-10-CM

## 2018-10-01 DIAGNOSIS — R634 Abnormal weight loss: Secondary | ICD-10-CM

## 2018-10-01 DIAGNOSIS — R630 Anorexia: Secondary | ICD-10-CM

## 2018-10-01 DIAGNOSIS — Z87440 Personal history of urinary (tract) infections: Secondary | ICD-10-CM

## 2018-10-01 DIAGNOSIS — G4709 Other insomnia: Secondary | ICD-10-CM

## 2018-10-01 DIAGNOSIS — Z515 Encounter for palliative care: Secondary | ICD-10-CM | POA: Diagnosis not present

## 2018-10-01 DIAGNOSIS — C159 Malignant neoplasm of esophagus, unspecified: Secondary | ICD-10-CM

## 2018-10-01 DIAGNOSIS — C155 Malignant neoplasm of lower third of esophagus: Secondary | ICD-10-CM

## 2018-10-01 DIAGNOSIS — J449 Chronic obstructive pulmonary disease, unspecified: Secondary | ICD-10-CM

## 2018-10-01 DIAGNOSIS — N3281 Overactive bladder: Secondary | ICD-10-CM

## 2018-10-01 DIAGNOSIS — Z79899 Other long term (current) drug therapy: Secondary | ICD-10-CM

## 2018-10-01 DIAGNOSIS — Z9989 Dependence on other enabling machines and devices: Secondary | ICD-10-CM

## 2018-10-01 DIAGNOSIS — M1990 Unspecified osteoarthritis, unspecified site: Secondary | ICD-10-CM

## 2018-10-01 DIAGNOSIS — E46 Unspecified protein-calorie malnutrition: Secondary | ICD-10-CM

## 2018-10-01 DIAGNOSIS — Z95828 Presence of other vascular implants and grafts: Secondary | ICD-10-CM

## 2018-10-01 DIAGNOSIS — Z7951 Long term (current) use of inhaled steroids: Secondary | ICD-10-CM

## 2018-10-01 DIAGNOSIS — D6481 Anemia due to antineoplastic chemotherapy: Secondary | ICD-10-CM

## 2018-10-01 DIAGNOSIS — R600 Localized edema: Secondary | ICD-10-CM

## 2018-10-01 LAB — CBC WITH DIFFERENTIAL/PLATELET
Abs Immature Granulocytes: 0.07 10*3/uL (ref 0.00–0.07)
Basophils Absolute: 0 10*3/uL (ref 0.0–0.1)
Basophils Relative: 1 %
Eosinophils Absolute: 0 10*3/uL (ref 0.0–0.5)
Eosinophils Relative: 0 %
HCT: 28.2 % — ABNORMAL LOW (ref 36.0–46.0)
Hemoglobin: 8.5 g/dL — ABNORMAL LOW (ref 12.0–15.0)
Immature Granulocytes: 1 %
Lymphocytes Relative: 7 %
Lymphs Abs: 0.4 10*3/uL — ABNORMAL LOW (ref 0.7–4.0)
MCH: 29.4 pg (ref 26.0–34.0)
MCHC: 30.1 g/dL (ref 30.0–36.0)
MCV: 97.6 fL (ref 80.0–100.0)
Monocytes Absolute: 0.6 10*3/uL (ref 0.1–1.0)
Monocytes Relative: 11 %
Neutro Abs: 4.3 10*3/uL (ref 1.7–7.7)
Neutrophils Relative %: 80 %
Platelets: 205 10*3/uL (ref 150–400)
RBC: 2.89 MIL/uL — ABNORMAL LOW (ref 3.87–5.11)
RDW: 21.5 % — ABNORMAL HIGH (ref 11.5–15.5)
WBC: 5.4 10*3/uL (ref 4.0–10.5)
nRBC: 0 % (ref 0.0–0.2)

## 2018-10-01 LAB — COMPREHENSIVE METABOLIC PANEL
ALT: 21 U/L (ref 0–44)
AST: 54 U/L — ABNORMAL HIGH (ref 15–41)
Albumin: 3.1 g/dL — ABNORMAL LOW (ref 3.5–5.0)
Alkaline Phosphatase: 138 U/L — ABNORMAL HIGH (ref 38–126)
Anion gap: 8 (ref 5–15)
BUN: 15 mg/dL (ref 8–23)
CO2: 29 mmol/L (ref 22–32)
Calcium: 8.3 mg/dL — ABNORMAL LOW (ref 8.9–10.3)
Chloride: 100 mmol/L (ref 98–111)
Creatinine, Ser: 0.78 mg/dL (ref 0.44–1.00)
GFR calc Af Amer: >60 mL/min (ref >60)
GFR calc non Af Amer: >60 mL/min (ref >60)
Glucose, Bld: 103 mg/dL — ABNORMAL HIGH (ref 70–99)
Potassium: 3.9 mmol/L (ref 3.5–5.1)
Sodium: 137 mmol/L (ref 135–145)
Total Bilirubin: 0.9 mg/dL (ref 0.3–1.2)
Total Protein: 6.5 g/dL (ref 6.5–8.1)

## 2018-10-01 NOTE — Progress Notes (Signed)
[facility]  [phone] [phone]   Name: LAJADA JANES Date: 10/01/2018 MRN: [id number]  DOB: [birth year]  Patient Care Team: Tonia Ghent, MD as PCP - General (Family Medicine)    REASON FOR CONSULTATION: Palliative Care consult requested for this 84 y.o. female with multiple medical problems including stage IV esophageal cancer s/p chemotherapy, immunotherapy, and XRT with disease progression most recently on Keytruda. 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 Patient also has recurrent UTIs and was last hospitalized  04/12/2018  to 04/14/18 with same. Palliative care was consulted to help address goals.   SOCIAL HISTORY:     reports that she quit smoking about 43 years ago. Her smoking use included cigarettes. She has a 20.00 pack-year smoking history. She has never used smokeless tobacco. She reports that she does not drink alcohol or use drugs.   Patient is widowed.  She lives at home alone.  She has 2 sons, 1 of whom lives in [place] and the other in [place].  Patient is a retired [employer] account executive.  ADVANCE DIRECTIVES:  Patient's son, Yvone Neu, is her healthcare power of attorney.  CODE STATUS: Previously DNR  PAST MEDICAL HISTORY: Past Medical History:  Diagnosis Date  . Arthritis   . Asthma   . COPD (chronic obstructive pulmonary disease) ([place])   . Dysphonia   . Dyspnea   . Esophageal cancer ([place])   . GERD (gastroesophageal reflux disease)   . Heart murmur   . History of kidney stones   . Hypertension   . Hypokalemia 10/07/2017  . Melanoma ([place])   . OAB (overactive bladder)   . OSA on CPAP   . 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 Personal history of chemotherapy    esophageal cancer  . Personal history of radiation therapy    esophageal cancer  . Pupil asymmetry    R pupil defect  .  Skin cancer   . Sleep apnea   . Spinal cord abscess     PAST SURGICAL HISTORY:  Past Surgical History:  Procedure Laterality Date  . ABDOMINAL HYSTERECTOMY    . ANTERIOR CERVICAL DECOMP/DISCECTOMY FUSION N/A 07/16/2013   Procedure: ANTERIOR CERVICAL DECOMPRESSION/DISCECTOMY FUSION mutlipleLEVELS C4-7;  Surgeon: Erline Levine, MD;  Location: [facility];  Service: Neurosurgery;  Laterality: N/A;  . APPENDECTOMY    . carpel tunn Bilateral   . CATARACT EXTRACTION    . CATARACT EXTRACTION, BILATERAL    . CHOLECYSTECTOMY    . dental implant    . ESOPHAGOGASTRODUODENOSCOPY Left 03/21/2017   Procedure: ESOPHAGOGASTRODUODENOSCOPY (EGD);  Surgeon: Virgel Manifold, MD;  Location: [facility] ENDOSCOPY;  Service: Endoscopy;  Laterality: Left;  . ESOPHAGOGASTRODUODENOSCOPY (EGD) WITH PROPOFOL N/A 08/20/2017   Procedure: ESOPHAGOGASTRODUODENOSCOPY (EGD) WITH PROPOFOL;  Surgeon: Virgel Manifold, MD;  Location: [facility] ENDOSCOPY;  Service: Endoscopy;  Laterality: N/A;  . ESOPHAGOGASTRODUODENOSCOPY (EGD) WITH PROPOFOL N/A 09/19/2017   Procedure: ESOPHAGOGASTRODUODENOSCOPY (EGD) WITH PROPOFOL;  Surgeon: Virgel Manifold, MD;  Location: [facility] ENDOSCOPY;  Service: Endoscopy;  Laterality: N/A;  . ESOPHAGOGASTRODUODENOSCOPY (EGD) WITH PROPOFOL N/A 10/03/2017   Procedure: ESOPHAGOGASTRODUODENOSCOPY (EGD) WITH PROPOFOL;  Surgeon: Virgel Manifold, MD;  Location: [facility] ENDOSCOPY;  Service: Endoscopy;  Laterality: N/A;  . ESOPHAGOGASTRODUODENOSCOPY (EGD) WITH PROPOFOL N/A 10/22/2017   Procedure: ESOPHAGOGASTRODUODENOSCOPY (EGD) WITH PROPOFOL;  Surgeon: Virgel Manifold, MD;  Location: [facility] ENDOSCOPY;  Service: Endoscopy;  Laterality: N/A;  . ESOPHAGOGASTRODUODENOSCOPY (EGD) WITH PROPOFOL N/A 11/18/2017   Procedure: ESOPHAGOGASTRODUODENOSCOPY (EGD) WITH PROPOFOL;  Surgeon: Virgel Manifold, MD;  Location: [facility] ENDOSCOPY;  Service: Endoscopy;  Laterality: N/A;  . ESOPHAGOGASTRODUODENOSCOPY (EGD) WITH PROPOFOL  N/A 12/17/2017   Procedure: ESOPHAGOGASTRODUODENOSCOPY (EGD) WITH PROPOFOL;  Surgeon: Virgel Manifold, MD;  Location: [facility] ENDOSCOPY;  Service: Endoscopy;  Laterality: N/A;  . ESOPHAGOGASTRODUODENOSCOPY (EGD) WITH PROPOFOL N/A 01/08/2018   Procedure: ESOPHAGOGASTRODUODENOSCOPY (EGD) WITH PROPOFOL;  Surgeon: Virgel Manifold, MD;  Location: [facility] ENDOSCOPY;  Service: Endoscopy;  Laterality: N/A;  . ESOPHAGOGASTRODUODENOSCOPY (EGD) WITH PROPOFOL N/A 01/22/2018   Procedure: ESOPHAGOGASTRODUODENOSCOPY (EGD) WITH PROPOFOL;  Surgeon: Virgel Manifold, MD;  Location: [facility] ENDOSCOPY;  Service: Endoscopy;  Laterality: N/A;  . ESOPHAGOGASTRODUODENOSCOPY (EGD) WITH PROPOFOL N/A 07/15/2018   Procedure: ESOPHAGOGASTRODUODENOSCOPY (EGD) WITH PROPOFOL;  Surgeon: Virgel Manifold, MD;  Location: [facility] ENDOSCOPY;  Service: Endoscopy;  Laterality: N/A;  . EYE SURGERY    . GASTRIC BYPASS    . HERNIA REPAIR    . HIATAL HERNIA REPAIR    . JOINT REPLACEMENT Bilateral    knee  . MELANOMA EXCISION    . PORTA CATH INSERTION N/A 04/07/2017   Procedure: PORTA CATH INSERTION;  Surgeon: Algernon Huxley, MD;  Location: [facility] CV LAB;  Service: Cardiovascular;  Laterality: N/A;  . POSTERIOR CERVICAL FUSION/FORAMINOTOMY N/A 07/28/2013   Procedure: Cervical four-seven  posterior cervical fusion;  Surgeon: Erline Levine, MD;  Location: [facility];  Service: Neurosurgery;  Laterality: N/A;  . TONSILLECTOMY    . TOTAL KNEE ARTHROPLASTY      HEMATOLOGY/ONCOLOGY HISTORY:  Oncology History   Patient is a 84 year old female who has metastatic esophageal cancer with liver involvement.  Weight loss 20 pounds for the past year prior to diagnosis.  #EGD during admission showed partially obstructive esophageal mass at the distal part of esophagus. Biopsy was taken. Pathology showed esophageal adenocarcinoma. #US biopsy of liver lesion to proof distant metastasis.  Report family history of breast cancer,but she has been  having routine mammograms.  # Lives alone.She's a former smoker.  # Molecular Testing:  #[employer] Cdx: No reportable alternations with companion diagnostic (CDx) claims.  MS stable Tumor mutation burden: Cannot be determined, CCND3 amplification: Equivocal.  Amended reports on 06/04/2017, Tumor mutational burden changed from can not be determined to "TMB -low" on the re-analyzed samples.  # HER 2 negative.   Current Treatment S/p 6 cycles of FOLFOX, PET scan showed excellent partial response from both chemotherapy and radiation, result was discussed with patient.  currently on 5-FU maintenance.  S/p palliative radiation in January 2019 # Developed esophageal stricture secondary to radiation in April and got dilation via endoscopy. EGD shoed Benign-appearing esophageal stenosis. This is a result of radiation therapy.- LA Grade C radiation esophagitis. - The previously noted esophageal mass in Nov 2018 was much decreased indicating good response to radiation/chemotherapy. - Normal stomach.- Normal examined jejunum.- No specimens collected.        Malignant neoplasm of lower third of esophagus (HCC)   03/29/2017 Initial Diagnosis    Malignant neoplasm of lower third of esophagus ([place])    01/27/2018 - 05/24/2018 Chemotherapy    The patient had PACLitaxel (TAXOL) 162 mg in sodium chloride 0.9 % 250 mL chemo infusion (</= 79m/m2), 80 mg/m2 = 162 mg, Intravenous,  Once, 4 of 7 cycles Dose modification: 65 mg/m2 (original dose 80 mg/m2, Cycle 2, Reason: Provider Judgment), 70 mg/m2 (original dose 80 mg/m2,  Cycle 4, Reason: Provider Judgment), 70 mg/m2 (original dose 80 mg/m2, Cycle 4, Reason: Provider Judgment) Administration: 162 mg (01/27/2018), 162 mg (03/02/2018), 132 mg (03/09/2018), 162 mg (02/03/2018), 162 mg (02/09/2018), 162 mg (02/23/2018), 162 mg (03/23/2018), 162 mg (03/30/2018), 162 mg (04/06/2018), 138 mg (04/27/2018), 138 mg (05/04/2018), 138 mg (05/11/2018) ramucirumab (CYRAMZA)  700 mg in sodium chloride 0.9 % 180 mL chemo infusion, 8 mg/kg = 700 mg, Intravenous, Once, 4 of 7 cycles Administration: 700 mg (01/27/2018), 700 mg (03/09/2018), 700 mg (02/09/2018), 700 mg (02/23/2018), 700 mg (03/23/2018), 700 mg (04/06/2018), 700 mg (04/27/2018), 700 mg (05/11/2018)  for chemotherapy treatment.     08/24/2018 -  Chemotherapy    The patient had [No matching medication found in this treatment plan]  for chemotherapy treatment.      Esophageal adenocarcinoma ([place])    Initial Diagnosis    Esophageal adenocarcinoma ([place])    05/27/2018 - 08/18/2018 Chemotherapy    The patient had pembrolizumab (KEYTRUDA) 200 mg in sodium chloride 0.9 % 50 mL chemo infusion, 200 mg, Intravenous, Once, 4 of 5 cycles Administration: 200 mg (05/27/2018), 200 mg (06/17/2018), 200 mg (07/08/2018), 200 mg (07/29/2018)  for chemotherapy treatment.     08/24/2018 -  Chemotherapy    The patient had [No matching medication found in this treatment plan]  for chemotherapy treatment.      ALLERGIES:  is allergic to cefuroxime; codeine; doxycycline; gabapentin; iohexol; other; oxycodone; quinolones; and synvisc [hylan g-f 20].  MEDICATIONS:  Current Outpatient Medications  Medication Sig Dispense Refill  . albuterol (PROVENTIL HFA;VENTOLIN HFA) 108 (90 Base) MCG/ACT inhaler Inhale 2 puffs into the lungs every 4 (four) hours as needed for wheezing or shortness of breath.    Marland Kitchen aspirin EC 81 MG tablet Take 1 tablet (81 mg total) by mouth every other day.    . calcium carbonate (TUMS EX) 750 MG chewable tablet Chew 1 tablet by mouth as needed for heartburn.     . chlorhexidine (PERIDEX) 0.12 % solution Use as directed 15 mLs in the mouth or throat 2 (two) times daily.    . Cholecalciferol (VITAMIN D) 2000 UNITS CAPS Take 2,000 Units by mouth daily.    Marland Kitchen dexamethasone (DECADRON) 4 MG tablet Take 0.5 tablets (2 mg total) by mouth daily. 30 tablet 0  . Diclofenac Sodium 1 % CREA Apply to left arm as needed for pain  three times daily (Patient not taking: Reported on 09/23/2018) 120 g 0  . dronabinol (MARINOL) 5 MG capsule Take 1 capsule (5 mg total) by mouth 4 (four) times daily. 120 capsule 0  . fluticasone (FLONASE) 50 MCG/ACT nasal spray Place 1-2 sprays into both nostrils daily. 16 g 5  . furosemide (LASIX) 20 MG tablet TAKE 1-2 TABLETS BY MOUTH DAILY AS NEEDED FOR EDEMA 180 tablet 1  . hydrocortisone cream 1 % Apply 1 application topically daily as needed for itching.     . loratadine (CLARITIN) 10 MG tablet Take 10 mg by mouth daily.    . Multiple Vitamins-Minerals (MULTIVITAMIN ADULT) CHEW Chew 1 tablet by mouth daily.     Marland Kitchen MYRBETRIQ 25 MG TB24 tablet     . oxybutynin (DITROPAN) 5 MG tablet TAKE ONE TABLET BY MOUTH EVERY MORNING AND TAKE ONE-HALF TABLET AT BEDTIME 45 tablet 3  . pantoprazole (PROTONIX) 40 MG tablet TAKE 1 TABLET BY MOUTH TWICE DAILY BEFORE A MEAL 60 tablet 3  . Polyethyl Glycol-Propyl Glycol (SYSTANE OP) Apply 1 drop to eye daily as needed (dry  eyes).    . potassium chloride (KLOR-CON) 20 MEQ packet Take 20 mEq by mouth daily. 30 packet 1  . pregabalin (LYRICA) 150 MG capsule TAKE 1 CAPSULE TWICE DAILY 60 capsule 3  . pyridOXINE (VITAMIN B-6) 100 MG tablet Take 1 tablet (100 mg total) by mouth daily. 30 tablet 3  . SENNA-PLUS 8.6-50 MG tablet TAKE 2 TABLETS BY MOUTH DAILY 60 tablet 0  . sulfamethoxazole-trimethoprim (BACTRIM) 400-80 MG tablet Take 1 tablet by mouth 2 (two) times daily. 10 tablet 0  . traMADol (ULTRAM) 50 MG tablet Take 1 tablet (50 mg total) by mouth every 6 (six) hours as needed for up to 30 days. 120 tablet 0  . traZODone (DESYREL) 50 MG tablet Take 0.5-1 tablets (25-50 mg total) by mouth at bedtime as needed for sleep.    Marland Kitchen trifluridine-tipiracil (LONSURF) 15-6.14 MG tablet Take 2 tablets by mouth two (2) times daily after a meal. Take with two 45m tablets. Take only on days 1-5, 8-12. Repeat every 28days. 40 tablet 3  . trifluridine-tipiracil (LONSURF) 20-8.19 MG  tablet Take 2 tablets by mouth two (2) times daily after a meal. Take with two 182mtablets. Take only on days 1-5, 8-12. Repeat every 28days. 40 tablet 3   No current facility-administered medications for this visit.    Facility-Administered Medications Ordered in Other Visits  Medication Dose Route Frequency Provider Last Rate Last Dose  . sodium chloride flush (NS) 0.9 % injection 10 mL  10 mL Intravenous PRN YuEarlie ServerMD   10 mL at 01/26/18 0904    VITAL SIGNS: BP 110/70 (BP Location: Right Arm, Patient Position: Sitting)   Pulse 78   Temp 97.9 F (36.6 C) (Tympanic)   Wt 186 lb 8 oz (84.6 kg)   BMI 34.11 kg/m  Filed Weights   10/01/18 1537  Weight: 186 lb 8 oz (84.6 kg)    Estimated body mass index is 34.11 kg/m as calculated from the following:   Height as of 07/24/18: _0  (1.575 m).   Weight as of this encounter: 186 lb 8 oz (84.6 kg).  LABS: CBC:    Component Value Date/Time   WBC 5.4 10/01/2018 1338   HGB 8.5 (L) 10/01/2018 1338   HGB 12.9 07/13/2013 0609   HCT 28.2 (L) 10/01/2018 1338   HCT 38.8 07/13/2013 0609   PLT 205 10/01/2018 1338   PLT 212 07/13/2013 0609   MCV 97.6 10/01/2018 1338   MCV 93 07/13/2013 0609   NEUTROABS 4.3 10/01/2018 1338   LYMPHSABS 0.4 (L) 10/01/2018 1338   MONOABS 0.6 10/01/2018 1338   EOSABS 0.0 10/01/2018 1338   BASOSABS 0.0 10/01/2018 1338   Comprehensive Metabolic Panel:    Component Value Date/Time   NA 137 10/01/2018 1338   NA 138 07/13/2013 0609   K 3.9 10/01/2018 1338   K 4.1 07/13/2013 0609   CL 100 10/01/2018 1338   CL 104 07/13/2013 0609   CO2 29 10/01/2018 1338   CO2 29 07/13/2013 0609   BUN 15 10/01/2018 1338   BUN 15 07/13/2013 0609   CREATININE 0.78 10/01/2018 1338   CREATININE 0.67 07/13/2013 0609   GLUCOSE 103 (H) 10/01/2018 1338   GLUCOSE 115 (H) 07/13/2013 0609   CALCIUM 8.3 (L) 10/01/2018 1338   CALCIUM 8.3 (L) 07/13/2013 0609   AST 54 (H) 10/01/2018 1338   ALT 21 10/01/2018 1338   ALKPHOS 138  (H) 10/01/2018 1338   BILITOT 0.9 10/01/2018 1338   PROT 6.5 10/01/2018  1338   ALBUMIN 3.1 (L) 10/01/2018 1338    RADIOGRAPHIC STUDIES: No results found.  PERFORMANCE STATUS (ECOG) : 1 - Symptomatic but completely ambulatory  Review of Systems Unless otherwise noted, a complete review of systems is negative.  Physical Exam General: NAD, frail appearing, thin Pulmonary: unlabored, CTA ant fields Cards: RRR Extremities: 3+ pitting edema Skin: no rashes Neurological: Weakness but otherwise nonfocal  IMPRESSION: Routine follow-up visit made in the clinic today.  Patient denies any significant changes or concerns today.  She denies any distressing symptoms.  She does report chronic edema in the lower extremities.  She also has had persistently poor oral intake with weight loss.  Weight down to 186 pounds today from 193 pounds last month.  Patient was started on appetite stimulants and feels that they have helped slightly.  She has been followed actively by dietitian.  I discussed with her the importance of a smaller more frequent meals during the day with a focus on high-protein and high-calorie.  We discussed various oral supplements that she has not yet tried.  She is not fond of sweet flavors.  She plans to start snacking more on nuts during the day to supplement protein.  We again discussed advance care planning.  I reviewed with her again a MOST Form, which she took home with her to discuss with her children.  Case and plan discussed with Dr. Tasia Catchings.  PLAN: -Continue current scope of treatment -We will complete ACP/MOST Form when patient is next in the clinic -RTC in 2 to 3 weeks   Patient expressed understanding and was in agreement with this plan. She also understands that She can call clinic at any time with any questions, concerns, or complaints.     Time Total: 15 minutes  Visit consisted of counseling and education dealing with the complex and emotionally intense  issues of symptom management and palliative care in the setting of serious and potentially life-threatening illness.Greater than 50%  of this time was spent counseling and coordinating care related to the above assessment and plan.  Signed by: Altha Harm, PhD, NP-C [phone] (Work Cell)

## 2018-10-02 ENCOUNTER — Other Ambulatory Visit: Payer: Self-pay | Admitting: Family Medicine

## 2018-10-02 ENCOUNTER — Inpatient Hospital Stay: Payer: Medicare Other | Admitting: Oncology

## 2018-10-04 ENCOUNTER — Encounter: Payer: Self-pay | Admitting: Oncology

## 2018-10-04 MED ORDER — DRONABINOL 5 MG PO CAPS: 5.0000 mg | ORAL_CAPSULE | Freq: Four times a day (QID) | ORAL | 0 refills | Status: AC

## 2018-10-04 MED ORDER — SULFAMETHOXAZOLE-TRIMETHOPRIM 400-80 MG PO TABS: 0.5000 | ORAL_TABLET | Freq: Two times a day (BID) | ORAL | 0 refills | Status: DC

## 2018-10-04 NOTE — Progress Notes (Signed)
Hematology/Oncology Follow Up Note [facility]  [phone] [phone]  Patient Care Team: Tonia Ghent, MD as PCP - General (Family Medicine)   Name of the patient: [name]  [id number]  12-06-1934   REASON FOR VISIT  follow-up for chemotherapy management of esophageal adenocarcinoma,   HISTORY OF PRESENT ILLNESS Patient is a 79-old female who has metastatic esophageal cancer with liver involvement.  Weight loss 20 pounds for the past year prior to diagnosis.  # EGD during admission showed partially obstructive esophageal mass at the distal part of esophagus. Biopsy was taken. Pathology showed esophageal adenocarcinoma. #US biopsy of liver lesion to proof distant metastasis.  Report family history of breast cancer, but she has been having routine mammograms.  # Lives alone. She's a former smoker.  # Molecular Testing:  #[employer] Cdx: No reportable alternations with companion diagnostic (CDx) claims.  MS stable Tumor mutation burden: Cannot be determined, CCND3 amplification: Equivocal.  Amended reports on 06/04/2017, Tumor mutational burden changed from "can not be determined" to "TMB -low" on the re-analyzed samples.  Tumor Proportion Score [TPS] 20%. # HER 2 negative.   Antineoplasm Treatments S/p 6 cycles of FOLFOX, PET scan showed excellent partial response from both chemotherapy and radiation, result was discussed with patient.  Patient was switched to 5-FU maintenance and to September 2019. S/p palliative radiation in January 2019 # April 2019 Developed esophageal stricture secondary to radiation in April and got dilation via endoscopy. EGD shoed Benign-appearing esophageal stenosis. This is a result of radiation therapy.- LA Grade C radiation esophagitis. - The previously noted esophageal mass in Nov 2018 was much decreased indicating good response to radiation/chemotherapy. - Normal stomach.- Normal examined jejunum.-  No specimens collected.   #01/23/2018 PET scan showed interval development of loculated ascites within the upper abdomen with overlies the liver and the spleen and exhibited increased radio tracer uptake.  Worrisome for peritoneal carcinomatosis.  Soft tissue infiltration within the omentum with increased radiotracer uptake noted.  Worrisome for peritoneal carcinomatosis.  New hypermetabolic internal mammary lymph nodes and a right CP angle lymph node.  Suspicious for metastatic adenopathy.  Mild FDG uptake is identified localizing to the lower third of the esophagus raising SUV max of 5.15.  # 01/26/2018 2nd line treatment Taxol and Cyramza  12/31/20219  CT chest abdomen pelvis unfortunately showed mixed response. # 05/27/2018 3nd line treatment with pembrolizumab.  07/21/2018 MRI of brain showed a subacute stroke. Patient was advised to restart on aspirin 81 mg.  And to follow-up with primary care physician. Patient has been seen by PCP and had a discussion about subacute stroke.  Patient stopped using aspirin because of increased bruising.  Decision was made to take aspirin 81 mg every other day.  Statin was not started given patient's metastatic esophageal cancer setting and a limited life span.  Metoprolol was discontinued due to low blood pressure.  # 08/18/2018 CT chest abdomen pelvis showed progression.  08/31/2018 started on oral chemotherapy Lonsurf 35 mg/m BID, day 1-5, and day 8-12 every 28 cycles.  INTERVAL HISTORY  84 y.o. female with history of metastatic esophageal cancer present for evaluation while on oral chemotherapy LONGSURF for the treatment of esophageal cancer.  # Denies any pain today. Uses tramadol as needed Appetite is very poor, she lost 4 pounds since last visit 2 weeks ago.  Eat yogurt, tomato soap.  Has been started on Dexamethasone 53m daily.   #frequent urination has improved # Chronic leg swelling,  uses lasix 30m daily.     Review of Systems  Constitutional:  Positive for appetite change, fatigue and unexpected weight change. Negative for chills and fever.  HENT:   Negative for hearing loss and voice change.   Eyes: Negative for eye problems.  Respiratory: Negative for chest tightness and cough.   Cardiovascular: Positive for leg swelling. Negative for chest pain.  Gastrointestinal: Negative for abdominal distention, abdominal pain and blood in stool.  Endocrine: Negative for hot flashes.  Genitourinary: Negative for difficulty urinating, dysuria and frequency.   Musculoskeletal: Negative for arthralgias and flank pain.  Skin: Negative for itching and rash.  Neurological: Positive for numbness. Negative for extremity weakness.  Hematological: Negative for adenopathy.  Psychiatric/Behavioral: Negative for confusion.     Allergies  Allergen Reactions  . Cefuroxime Nausea Only  . Codeine Nausea Only  . Doxycycline Other (See Comments)    Lip swelling  . Gabapentin Other (See Comments)    Swelling.   . Iohexol Itching    07-15-13 pt developed itching on fingers after contrast given. Dr. OIrish Elderslooked at pt and said to put in system as allergy. BB  . Other Other (See Comments)    Contrast Dye- caused fever, chills, awful feeling Bandages - itching, rash  . Oxycodone Other (See Comments)    nausea  . Quinolones Swelling    Lip swelling  . Synvisc [Hylan G-F 20] Swelling     Past Medical History:  Diagnosis Date  . Arthritis   . Asthma   . COPD (chronic obstructive pulmonary disease) ([place]   . Dysphonia   . Dyspnea   . Esophageal cancer ([place]   . GERD (gastroesophageal reflux disease)   . Heart murmur   . History of kidney stones   . Hypertension   . Hypokalemia 10/07/2017  . Melanoma ([place]   . OAB (overactive bladder)   . OSA on CPAP   . 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 Personal history of chemotherapy    esophageal cancer  . Personal history of radiation therapy    esophageal cancer  . Pupil asymmetry     R pupil defect  . Skin cancer   . Sleep apnea   . Spinal cord abscess      Past Surgical History:  Procedure Laterality Date  . ABDOMINAL HYSTERECTOMY    . ANTERIOR CERVICAL DECOMP/DISCECTOMY FUSION N/A 07/16/2013   Procedure: ANTERIOR CERVICAL DECOMPRESSION/DISCECTOMY FUSION mutlipleLEVELS C4-7;  Surgeon: JErline Levine MD;  [facility];  Service: Neurosurgery;  Laterality: N/A;  . APPENDECTOMY    . carpel tunn Bilateral   . CATARACT EXTRACTION    . CATARACT EXTRACTION, BILATERAL    . CHOLECYSTECTOMY    . dental implant    . ESOPHAGOGASTRODUODENOSCOPY Left 03/21/2017   Procedure: ESOPHAGOGASTRODUODENOSCOPY (EGD);  Surgeon: TVirgel Manifold MD;  [facility];  Service: Endoscopy;  Laterality: Left;  . ESOPHAGOGASTRODUODENOSCOPY (EGD) WITH PROPOFOL N/A 08/20/2017   Procedure: ESOPHAGOGASTRODUODENOSCOPY (EGD) WITH PROPOFOL;  Surgeon: TVirgel Manifold MD;  Location: [facility]COPY;  Service: Endoscopy;  Laterality: N/A;  . ESOPHAGOGASTRODUODENOSCOPY (EGD) WITH PROPOFOL N/A 09/19/2017   Procedure: ESOPHAGOGASTRODUODENOSCOPY (EGD) WITH PROPOFOL;  Surgeon: TVirgel Manifold MD;  Location: [facility];  Service: Endoscopy;  Laterality: N/A;  . ESOPHAGOGASTRODUODENOSCOPY (EGD) WITH PROPOFOL N/A 10/03/2017   Procedure: ESOPHAGOGASTRODUODENOSCOPY (EGD) WITH PROPOFOL;  Surgeon: TVirgel Manifold MD;  Location: [facility]OSCOPY;  Service: Endoscopy;  Laterality: N/A;  . ESOPHAGOGASTRODUODENOSCOPY (EGD) WITH PROPOFOL  N/A 10/22/2017   Procedure: ESOPHAGOGASTRODUODENOSCOPY (EGD) WITH PROPOFOL;  Surgeon: Virgel Manifold, MD;  Location: [facility]COPY;  Service: Endoscopy;  Laterality: N/A;  . ESOPHAGOGASTRODUODENOSCOPY (EGD) WITH PROPOFOL N/A 11/18/2017   Procedure: ESOPHAGOGASTRODUODENOSCOPY (EGD) WITH PROPOFOL;  Surgeon: Virgel Manifold, MD;  Location: [facility];  Service: Endoscopy;  Laterality: N/A;  . ESOPHAGOGASTRODUODENOSCOPY (EGD) WITH PROPOFOL  N/A 12/17/2017   Procedure: ESOPHAGOGASTRODUODENOSCOPY (EGD) WITH PROPOFOL;  Surgeon: Virgel Manifold, MD;  Location: [facility];  Service: Endoscopy;  Laterality: N/A;  . ESOPHAGOGASTRODUODENOSCOPY (EGD) WITH PROPOFOL N/A 01/08/2018   Procedure: ESOPHAGOGASTRODUODENOSCOPY (EGD) WITH PROPOFOL;  Surgeon: Virgel Manifold, MD;  Location: [facility];  Service: Endoscopy;  Laterality: N/A;  . ESOPHAGOGASTRODUODENOSCOPY (EGD) WITH PROPOFOL N/A 01/22/2018   Procedure: ESOPHAGOGASTRODUODENOSCOPY (EGD) WITH PROPOFOL;  Surgeon: Virgel Manifold, MD;  Location: [facility];  Service: Endoscopy;  Laterality: N/A;  . ESOPHAGOGASTRODUODENOSCOPY (EGD) WITH PROPOFOL N/A 07/15/2018   Procedure: ESOPHAGOGASTRODUODENOSCOPY (EGD) WITH PROPOFOL;  Surgeon: Virgel Manifold, MD;  Location: [facility] ENDOSCOPY;  Service: Endoscopy;  Laterality: N/A;  . EYE SURGERY    . GASTRIC BYPASS    . HERNIA REPAIR    . HIATAL HERNIA REPAIR    . JOINT REPLACEMENT Bilateral    knee  . MELANOMA EXCISION    . PORTA CATH INSERTION N/A 04/07/2017   Procedure: PORTA CATH INSERTION;  Surgeon: Algernon Huxley, MD;  Location: [place] [facility];  Service: Cardiovascular;  Laterality: N/A;  . POSTERIOR CERVICAL FUSION/FORAMINOTOMY N/A 07/28/2013   Procedure: Cervical four-seven  posterior cervical fusion;  Surgeon: Erline Levine, MD;  Location: [place] [facility];  Service: Neurosurgery;  Laterality: N/A;  . TONSILLECTOMY    . TOTAL KNEE ARTHROPLASTY      Social History   Socioeconomic History  . Marital status: Widowed    Spouse name: Not on file  . Number of children: 2  . Years of education: Not on file  . Highest education level: Master's degree (e.g., MA, MS, MEng, MEd, MSW, MBA)  Occupational History  . Not on file  Social Needs  . Financial resource strain: Not hard at all  . Food insecurity:    Worry: Never true    Inability: Never true  . Transportation needs:    Medical: No    Non-medical: No  Tobacco  Use  . Smoking status: Former Smoker    Packs/day: 1.00    Years: 20.00    Pack years: 20.00    Types: Cigarettes    Last attempt to quit: 1977    Years since quitting: 43.4  . Smokeless tobacco: Never Used  Substance and Sexual Activity  . Alcohol use: No  . Drug use: Never  . Sexual activity: Not Currently  Lifestyle  . Physical activity:    Days per week: 2 days    Minutes per session: 30 min  . Stress: Not at all  Relationships  . Social connections:    Talks on phone: More than three times a week    Gets together: More than three times a week    Attends religious service: More than 4 times per year    Active member of club or organization: Yes    Attends meetings of clubs or organizations: More than 4 times per year    Relationship status: Widowed  . Intimate partner violence:    Fear of current or ex partner: No    Emotionally abused: No    Physically abused: No    Forced  sexual activity: No  Other Topics Concern  . Not on file  Social History Narrative   Marital Status- Widowed    Lives by herself    Employement- Retired Pharmacist, hospital   Exercise hx- Does PT     Family History  Problem Relation Age of Onset  . Breast cancer Mother 22  . Arthritis-Osteo Mother   . Breast cancer Sister 3  . Bladder Cancer Sister   . Bladder Cancer Father   . Tongue cancer Father   . Congenital heart disease Father   . Prostate cancer Brother   . Uterine cancer Maternal Aunt   . Lung cancer Paternal Uncle   . Leukemia Maternal Grandmother   . Liver cancer Paternal Grandmother   . Colon cancer Neg Hx      Current Outpatient Medications:  .  albuterol (PROVENTIL HFA;VENTOLIN HFA) 108 (90 Base) MCG/ACT inhaler, Inhale 2 puffs into the lungs every 4 (four) hours as needed for wheezing or shortness of breath., Disp: , Rfl:  .  aspirin EC 81 MG tablet, Take 1 tablet (81 mg total) by mouth every other day., Disp: , Rfl:  .  calcium carbonate (TUMS EX) 750 MG chewable tablet, Chew 1  tablet by mouth as needed for heartburn. , Disp: , Rfl:  .  chlorhexidine (PERIDEX) 0.12 % solution, Use as directed 15 mLs in the mouth or throat 2 (two) times daily., Disp: , Rfl:  .  Cholecalciferol (VITAMIN D) 2000 UNITS CAPS, Take 2,000 Units by mouth daily., Disp: , Rfl:  .  dexamethasone (DECADRON) 4 MG tablet, Take 0.5 tablets (2 mg total) by mouth daily., Disp: 30 tablet, Rfl: 0 .  Diclofenac Sodium 1 % CREA, Apply to left arm as needed for pain three times daily (Patient not taking: Reported on 09/23/2018), Disp: 120 g, Rfl: 0 .  dronabinol (MARINOL) 5 MG capsule, Take 1 capsule (5 mg total) by mouth 4 (four) times daily., Disp: 120 capsule, Rfl: 0 .  fluticasone (FLONASE) 50 MCG/ACT nasal spray, Place 1-2 sprays into both nostrils daily., Disp: 16 g, Rfl: 5 .  furosemide (LASIX) 20 MG tablet, TAKE 1-2 TABLETS BY MOUTH DAILY AS NEEDED FOR EDEMA, Disp: 180 tablet, Rfl: 1 .  hydrocortisone cream 1 %, Apply 1 application topically daily as needed for itching. , Disp: , Rfl:  .  loratadine (CLARITIN) 10 MG tablet, Take 10 mg by mouth daily., Disp: , Rfl:  .  Multiple Vitamins-Minerals (MULTIVITAMIN ADULT) CHEW, Chew 1 tablet by mouth daily. , Disp: , Rfl:  .  MYRBETRIQ 25 MG TB24 tablet, , Disp: , Rfl:  .  oxybutynin (DITROPAN) 5 MG tablet, TAKE ONE TABLET BY MOUTH EVERY MORNING AND TAKE ONE-HALF TABLET AT BEDTIME, Disp: 45 tablet, Rfl: 3 .  pantoprazole (PROTONIX) 40 MG tablet, TAKE 1 TABLET BY MOUTH TWICE DAILY BEFORE A MEAL, Disp: 60 tablet, Rfl: 3 .  Polyethyl Glycol-Propyl Glycol (SYSTANE OP), Apply 1 drop to eye daily as needed (dry eyes)., Disp: , Rfl:  .  potassium chloride (KLOR-CON) 20 MEQ packet, Take 20 mEq by mouth daily., Disp: 30 packet, Rfl: 1 .  pregabalin (LYRICA) 150 MG capsule, TAKE 1 CAPSULE TWICE DAILY, Disp: 60 capsule, Rfl: 3 .  pyridOXINE (VITAMIN B-6) 100 MG tablet, Take 1 tablet (100 mg total) by mouth daily., Disp: 30 tablet, Rfl: 3 .  SENNA-PLUS 8.6-50 MG tablet,  TAKE 2 TABLETS BY MOUTH DAILY, Disp: 60 tablet, Rfl: 0 .  sulfamethoxazole-trimethoprim (BACTRIM) 400-80 MG tablet, Take 1  tablet by mouth 2 (two) times daily., Disp: 10 tablet, Rfl: 0 .  traMADol (ULTRAM) 50 MG tablet, Take 1 tablet (50 mg total) by mouth every 6 (six) hours as needed for up to 30 days., Disp: 120 tablet, Rfl: 0 .  traZODone (DESYREL) 50 MG tablet, Take 0.5-1 tablets (25-50 mg total) by mouth at bedtime as needed for sleep., Disp: , Rfl:  .  trifluridine-tipiracil (LONSURF) 15-6.14 MG tablet, Take 2 tablets by mouth two (2) times daily after a meal. Take with two 55m tablets. Take only on days 1-5, 8-12. Repeat every 28days., Disp: 40 tablet, Rfl: 3 .  trifluridine-tipiracil (LONSURF) 20-8.19 MG tablet, Take 2 tablets by mouth two (2) times daily after a meal. Take with two 173mtablets. Take only on days 1-5, 8-12. Repeat every 28days., Disp: 40 tablet, Rfl: 3 No current facility-administered medications for this visit.   Facility-Administered Medications Ordered in Other Visits:  .  sodium chloride flush (NS) 0.9 % injection 10 mL, 10 mL, Intravenous, PRN, YuEarlie ServerMD, 10 mL at 01/26/18 0904  Physical exam:   Today's Vitals   10/06/18 0925  BP: 110/70  Pulse: 78  Temp: 97.9 F (36.6 C)  TempSrc: Tympanic  Weight: 186 lb (84.4 kg)   Body mass index is 34.02 kg/m. ECOG 2 Physical Exam  Constitutional: She is oriented to person, place, and time. No distress.  HENT:  Head: Normocephalic and atraumatic.  Nose: Nose normal.  Mouth/Throat: Oropharynx is clear and moist. No oropharyngeal exudate.  Eyes: Pupils are equal, round, and reactive to light. Conjunctivae and EOM are normal. No scleral icterus.  Neck: Normal range of motion. Neck supple. No JVD present.  Cardiovascular: Normal rate and regular rhythm.  Murmur heard. Pulmonary/Chest: Effort normal and breath sounds normal. No respiratory distress. She has no wheezes. She has no rales. She exhibits no  tenderness.  Abdominal: Soft. Bowel sounds are normal. She exhibits no distension. There is no abdominal tenderness.  Musculoskeletal: Normal range of motion.        General: Edema present. No tenderness or deformity.  Lymphadenopathy:    She has no cervical adenopathy.  Neurological: She is alert and oriented to person, place, and time. No cranial nerve deficit. She exhibits normal muscle tone. Coordination normal.  Skin: Skin is warm and dry. She is not diaphoretic. No erythema.  Psychiatric: Affect and judgment normal.    CMP Latest Ref Rng & Units 10/01/2018  Glucose 70 - 99 mg/dL 103(H)  BUN 8 - 23 mg/dL 15  Creatinine 0.44 - 1.00 mg/dL 0.78  Sodium 135 - 145 mmol/L 137  Potassium 3.5 - 5.1 mmol/L 3.9  Chloride 98 - 111 mmol/L 100  CO2 22 - 32 mmol/L 29  Calcium 8.9 - 10.3 mg/dL 8.3(L)  Total Protein 6.5 - 8.1 g/dL 6.5  Total Bilirubin 0.3 - 1.2 mg/dL 0.9  Alkaline Phos 38 - 126 U/L 138(H)  AST 15 - 41 U/L 54(H)  ALT 0 - 44 U/L 21   CBC Latest Ref Rng & Units 10/01/2018  WBC 4.0 - 10.5 K/uL 5.4  Hemoglobin 12.0 - 15.0 g/dL 8.5(L)  Hematocrit 36.0 - 46.0 % 28.2(L)  Platelets 150 - 400 K/uL 205   RADIOGRAPHIC STUDIES: I have personally reviewed the radiological images as listed and agreed with the findings in the report.  PET scan 01/23/2018  Interval development of loculated ascites within the upper abdomen which overlies the liver and spleen and exhibits increased radiotracer uptake. Findings worrisome for  peritoneal carcinomatosis. Consider further evaluation with diagnostic paracentesis. 2. Soft tissue infiltration within the omentum with increased radiotracer uptake noted. Also worrisome for peritoneal carcinomatosis. 3. New hypermetabolic internal mammary lymph nodes and right CP angle lymph node. Suspicious for metastatic adenopathy. 4. Mild FDG uptake is identified localizing to the lower third of the esophagus within SUV max of 5.15.  Bone Scan 05/08/2018  1.  Several small foci of activity in anterior ribs as noted above, and metastatic involvement can not be excluded. 2. Somewhat mottled activity within the midthoracic and upper lumbar spine and metastatic involvement can not be excluded. Correlate with plain films or MRI preferably.  08/18/2018 CT chest abdomen pelvis with contrast 1. New and enlarging tumors throughout the liver compatible with progressive malignancy. 2. New 2.0 by 3.1 cm soft tissue density between the stomach in the liver, probably a malignant deposit/lymph node. 3. Stable mild circumferential wall thickening in the distal esophagus. Prior gastric bypass. 4. Other imaging findings of potential clinical significance: Aortic Atherosclerosis (ICD10-I70.0). Coronary atherosclerosis. Mild cardiomegaly. Aortic and mitral valve calcifications. Mild increase in right middle lobe atelectasis without obvious tumor in the right middle lobe. Scarring or atelectasis in the lung bases. New trace left pleural effusion. Thoracolumbar spondylosis and degenerative disc disease. Non-specific 3 mm hypodense lesion in the spleen. Stable left kidney lower pole staghorn calculus, nonobstructive. Cutaneous thickening in the right lower quadrant anterior abdominal wall, possibly from low-grade panniculitis.  Assessment and plan  Cancer Staging Malignant neoplasm of lower third of esophagus [facility]) Staging form: Esophagus - Adenocarcinoma, AJCC 8th Edition - Clinical stage from 04/10/2017: Stage IVB (cTX, cNX, pM1) - Signed by Earlie Server, MD on 04/10/2017  1. Esophageal adenocarcinoma ([place])   2. Encounter for antineoplastic chemotherapy   3. Protein-calorie malnutrition, unspecified severity ([place])   4. Port-A-Cath in place   5. Anemia due to antineoplastic chemotherapy   6. Weight loss    #Esophageal cancer, metastatic Currently on third line chemotherapy treatment with Lonsurf. CEA is not a marker anymore.  Overall tolerating well.conitnue.   # Weight  loss, poor appetite, continue Dexamethasone 76m daily. Also on Marinol 558mQID  # Recurrent UTI, resolved. Myrbetriq 2582maily. Consider prophylactic antibiotics.  # Anemia, due to chemotherapy. Continue to monitor.    #Depression/insomnia, continue trazodone 50 mg nightly. #Bone metastasis, discussed with patient about obtaining dental clearance for Zometa use. #Constipation, Continue  Senokot 2 tabs daily. # neoplasm related pain, continue Tramadol 11m47m hours as needed. Refill sent to pharmacy.  # Port A cath in place, flush Q6 weeks.   Follow-up on 6/12   ZhouEarlie Server, PhD

## 2018-10-06 NOTE — Telephone Encounter (Signed)
Refilled in April by Dr. Collie Siad office.  Please advise.

## 2018-10-07 ENCOUNTER — Ambulatory Visit: Payer: Medicare Other | Admitting: Gastroenterology

## 2018-10-07 NOTE — Telephone Encounter (Signed)
Sent. Thanks.   

## 2018-10-12 ENCOUNTER — Other Ambulatory Visit: Payer: Self-pay

## 2018-10-12 ENCOUNTER — Telehealth: Payer: Self-pay | Admitting: *Deleted

## 2018-10-12 NOTE — Telephone Encounter (Signed)
Son [name] called reporting that he is noting some fogginess, listlessness and lethargy in his mother and is asking where do we go from here. He is requesting Dr Tasia Catchings return his call [phone]

## 2018-10-12 NOTE — Telephone Encounter (Signed)
I attempted calling son and got voice mail and left message on his voice mail to take her to ER. I called and spoke with patient who seems fine , she is alert and oriented and she stated [name] is "not very stable" She states she does feel sleepy all the time, but she takes medicines that make her that way. She does not want to go to the ER. I told her [name] may be trying to take her to ER after my message.

## 2018-10-12 NOTE — Telephone Encounter (Signed)
[name], I have called his son multiple times. He does not answer. Please advise to send patient to ER immediately for evaluation.

## 2018-10-13 ENCOUNTER — Inpatient Hospital Stay: Payer: Medicare Other | Attending: Oncology

## 2018-10-13 ENCOUNTER — Other Ambulatory Visit: Payer: Self-pay | Admitting: Oncology

## 2018-10-13 DIAGNOSIS — C155 Malignant neoplasm of lower third of esophagus: Secondary | ICD-10-CM | POA: Insufficient documentation

## 2018-10-13 DIAGNOSIS — G893 Neoplasm related pain (acute) (chronic): Secondary | ICD-10-CM | POA: Insufficient documentation

## 2018-10-13 DIAGNOSIS — Z8052 Family history of malignant neoplasm of bladder: Secondary | ICD-10-CM | POA: Insufficient documentation

## 2018-10-13 DIAGNOSIS — Z801 Family history of malignant neoplasm of trachea, bronchus and lung: Secondary | ICD-10-CM | POA: Insufficient documentation

## 2018-10-13 DIAGNOSIS — E46 Unspecified protein-calorie malnutrition: Secondary | ICD-10-CM | POA: Insufficient documentation

## 2018-10-13 DIAGNOSIS — Z79899 Other long term (current) drug therapy: Secondary | ICD-10-CM | POA: Insufficient documentation

## 2018-10-13 DIAGNOSIS — J449 Chronic obstructive pulmonary disease, unspecified: Secondary | ICD-10-CM | POA: Insufficient documentation

## 2018-10-13 DIAGNOSIS — Z87891 Personal history of nicotine dependence: Secondary | ICD-10-CM | POA: Insufficient documentation

## 2018-10-13 DIAGNOSIS — D701 Agranulocytosis secondary to cancer chemotherapy: Secondary | ICD-10-CM | POA: Insufficient documentation

## 2018-10-13 DIAGNOSIS — Z7982 Long term (current) use of aspirin: Secondary | ICD-10-CM | POA: Insufficient documentation

## 2018-10-13 DIAGNOSIS — Z803 Family history of malignant neoplasm of breast: Secondary | ICD-10-CM | POA: Insufficient documentation

## 2018-10-13 DIAGNOSIS — T451X5A Adverse effect of antineoplastic and immunosuppressive drugs, initial encounter: Secondary | ICD-10-CM | POA: Insufficient documentation

## 2018-10-13 DIAGNOSIS — C787 Secondary malignant neoplasm of liver and intrahepatic bile duct: Secondary | ICD-10-CM | POA: Insufficient documentation

## 2018-10-13 DIAGNOSIS — C159 Malignant neoplasm of esophagus, unspecified: Secondary | ICD-10-CM

## 2018-10-13 DIAGNOSIS — I10 Essential (primary) hypertension: Secondary | ICD-10-CM | POA: Insufficient documentation

## 2018-10-13 DIAGNOSIS — Z515 Encounter for palliative care: Secondary | ICD-10-CM | POA: Insufficient documentation

## 2018-10-13 DIAGNOSIS — Z8049 Family history of malignant neoplasm of other genital organs: Secondary | ICD-10-CM | POA: Insufficient documentation

## 2018-10-13 DIAGNOSIS — F329 Major depressive disorder, single episode, unspecified: Secondary | ICD-10-CM | POA: Insufficient documentation

## 2018-10-13 DIAGNOSIS — Z7951 Long term (current) use of inhaled steroids: Secondary | ICD-10-CM | POA: Insufficient documentation

## 2018-10-13 DIAGNOSIS — G4700 Insomnia, unspecified: Secondary | ICD-10-CM | POA: Insufficient documentation

## 2018-10-22 ENCOUNTER — Other Ambulatory Visit: Payer: Self-pay

## 2018-10-23 ENCOUNTER — Inpatient Hospital Stay (HOSPITAL_BASED_OUTPATIENT_CLINIC_OR_DEPARTMENT_OTHER): Payer: Medicare Other | Admitting: Hospice and Palliative Medicine

## 2018-10-23 ENCOUNTER — Other Ambulatory Visit: Payer: Self-pay

## 2018-10-23 ENCOUNTER — Inpatient Hospital Stay (HOSPITAL_BASED_OUTPATIENT_CLINIC_OR_DEPARTMENT_OTHER): Payer: Medicare Other | Admitting: Oncology

## 2018-10-23 ENCOUNTER — Telehealth: Payer: Self-pay | Admitting: *Deleted

## 2018-10-23 ENCOUNTER — Encounter: Payer: Self-pay | Admitting: Oncology

## 2018-10-23 ENCOUNTER — Inpatient Hospital Stay: Payer: Medicare Other | Admitting: *Deleted

## 2018-10-23 ENCOUNTER — Other Ambulatory Visit: Payer: Self-pay | Admitting: Oncology

## 2018-10-23 VITALS — BP 125/76 | HR 92 | Temp 97.3°F | Resp 18 | Wt 181.8 lb

## 2018-10-23 DIAGNOSIS — Z7982 Long term (current) use of aspirin: Secondary | ICD-10-CM

## 2018-10-23 DIAGNOSIS — Z79899 Other long term (current) drug therapy: Secondary | ICD-10-CM | POA: Diagnosis not present

## 2018-10-23 DIAGNOSIS — Z95828 Presence of other vascular implants and grafts: Secondary | ICD-10-CM

## 2018-10-23 DIAGNOSIS — C787 Secondary malignant neoplasm of liver and intrahepatic bile duct: Secondary | ICD-10-CM

## 2018-10-23 DIAGNOSIS — N3000 Acute cystitis without hematuria: Secondary | ICD-10-CM

## 2018-10-23 DIAGNOSIS — G893 Neoplasm related pain (acute) (chronic): Secondary | ICD-10-CM

## 2018-10-23 DIAGNOSIS — Z8052 Family history of malignant neoplasm of bladder: Secondary | ICD-10-CM

## 2018-10-23 DIAGNOSIS — Z7951 Long term (current) use of inhaled steroids: Secondary | ICD-10-CM | POA: Diagnosis not present

## 2018-10-23 DIAGNOSIS — F329 Major depressive disorder, single episode, unspecified: Secondary | ICD-10-CM | POA: Diagnosis not present

## 2018-10-23 DIAGNOSIS — Z5111 Encounter for antineoplastic chemotherapy: Secondary | ICD-10-CM

## 2018-10-23 DIAGNOSIS — E46 Unspecified protein-calorie malnutrition: Secondary | ICD-10-CM | POA: Diagnosis not present

## 2018-10-23 DIAGNOSIS — Z87891 Personal history of nicotine dependence: Secondary | ICD-10-CM | POA: Diagnosis not present

## 2018-10-23 DIAGNOSIS — R634 Abnormal weight loss: Secondary | ICD-10-CM

## 2018-10-23 DIAGNOSIS — Z803 Family history of malignant neoplasm of breast: Secondary | ICD-10-CM

## 2018-10-23 DIAGNOSIS — I10 Essential (primary) hypertension: Secondary | ICD-10-CM | POA: Diagnosis not present

## 2018-10-23 DIAGNOSIS — C159 Malignant neoplasm of esophagus, unspecified: Secondary | ICD-10-CM

## 2018-10-23 DIAGNOSIS — Z7189 Other specified counseling: Secondary | ICD-10-CM

## 2018-10-23 DIAGNOSIS — Z801 Family history of malignant neoplasm of trachea, bronchus and lung: Secondary | ICD-10-CM

## 2018-10-23 DIAGNOSIS — C155 Malignant neoplasm of lower third of esophagus: Secondary | ICD-10-CM | POA: Diagnosis present

## 2018-10-23 DIAGNOSIS — Z8049 Family history of malignant neoplasm of other genital organs: Secondary | ICD-10-CM | POA: Diagnosis not present

## 2018-10-23 DIAGNOSIS — T451X5A Adverse effect of antineoplastic and immunosuppressive drugs, initial encounter: Secondary | ICD-10-CM | POA: Diagnosis not present

## 2018-10-23 DIAGNOSIS — D701 Agranulocytosis secondary to cancer chemotherapy: Secondary | ICD-10-CM | POA: Diagnosis not present

## 2018-10-23 DIAGNOSIS — G4700 Insomnia, unspecified: Secondary | ICD-10-CM | POA: Diagnosis not present

## 2018-10-23 DIAGNOSIS — R110 Nausea: Secondary | ICD-10-CM

## 2018-10-23 DIAGNOSIS — Z515 Encounter for palliative care: Secondary | ICD-10-CM

## 2018-10-23 DIAGNOSIS — J449 Chronic obstructive pulmonary disease, unspecified: Secondary | ICD-10-CM | POA: Diagnosis not present

## 2018-10-23 LAB — COMPREHENSIVE METABOLIC PANEL
ALT: 16 U/L (ref 0–44)
AST: 49 U/L — ABNORMAL HIGH (ref 15–41)
Albumin: 3.3 g/dL — ABNORMAL LOW (ref 3.5–5.0)
Alkaline Phosphatase: 111 U/L (ref 38–126)
Anion gap: 13 (ref 5–15)
BUN: 11 mg/dL (ref 8–23)
CO2: 24 mmol/L (ref 22–32)
Calcium: 8.4 mg/dL — ABNORMAL LOW (ref 8.9–10.3)
Chloride: 99 mmol/L (ref 98–111)
Creatinine, Ser: 1 mg/dL (ref 0.44–1.00)
GFR calc Af Amer: >60 mL/min (ref >60)
GFR calc non Af Amer: 53 mL/min — ABNORMAL LOW (ref >60)
Glucose, Bld: 184 mg/dL — ABNORMAL HIGH (ref 70–99)
Potassium: 3 mmol/L — ABNORMAL LOW (ref 3.5–5.1)
Sodium: 136 mmol/L (ref 135–145)
Total Bilirubin: 1.2 mg/dL (ref 0.3–1.2)
Total Protein: 6.9 g/dL (ref 6.5–8.1)

## 2018-10-23 LAB — CBC WITH DIFFERENTIAL/PLATELET
Abs Immature Granulocytes: 0.01 10*3/uL (ref 0.00–0.07)
Basophils Absolute: 0 10*3/uL (ref 0.0–0.1)
Basophils Relative: 1 %
Eosinophils Absolute: 0.1 10*3/uL (ref 0.0–0.5)
Eosinophils Relative: 4 %
HCT: 28.2 % — ABNORMAL LOW (ref 36.0–46.0)
Hemoglobin: 8.4 g/dL — ABNORMAL LOW (ref 12.0–15.0)
Immature Granulocytes: 1 %
Lymphocytes Relative: 16 %
Lymphs Abs: 0.3 10*3/uL — ABNORMAL LOW (ref 0.7–4.0)
MCH: 30.2 pg (ref 26.0–34.0)
MCHC: 29.8 g/dL — ABNORMAL LOW (ref 30.0–36.0)
MCV: 101.4 fL — ABNORMAL HIGH (ref 80.0–100.0)
Monocytes Absolute: 0.5 10*3/uL (ref 0.1–1.0)
Monocytes Relative: 28 %
Neutro Abs: 0.8 10*3/uL — ABNORMAL LOW (ref 1.7–7.7)
Neutrophils Relative %: 50 %
Platelets: 198 10*3/uL (ref 150–400)
RBC: 2.78 MIL/uL — ABNORMAL LOW (ref 3.87–5.11)
RDW: 23.1 % — ABNORMAL HIGH (ref 11.5–15.5)
WBC: 1.6 10*3/uL — ABNORMAL LOW (ref 4.0–10.5)
nRBC: 0 % (ref 0.0–0.2)

## 2018-10-23 MED ORDER — POTASSIUM CHLORIDE 20 MEQ PO PACK: 20.0000 meq | PACK | Freq: Every day | ORAL | 0 refills | Status: AC

## 2018-10-23 MED ORDER — SULFAMETHOXAZOLE-TRIMETHOPRIM 400-80 MG PO TABS: 0.5000 | ORAL_TABLET | Freq: Two times a day (BID) | ORAL | 2 refills | Status: DC

## 2018-10-23 MED ORDER — SODIUM CHLORIDE 0.9% FLUSH
10.0000 mL | Freq: Once | INTRAVENOUS | Status: AC
  Administered 2018-10-23: 10 mL via INTRAVENOUS
  Filled 2018-10-23: qty 10

## 2018-10-23 MED ORDER — DEXAMETHASONE 4 MG PO TABS: 2.0000 mg | ORAL_TABLET | Freq: Every day | ORAL | 0 refills | Status: DC

## 2018-10-23 MED ORDER — HEPARIN SOD (PORK) LOCK FLUSH 100 UNIT/ML IV SOLN
500.0000 [IU] | Freq: Once | INTRAVENOUS | Status: AC
  Administered 2018-10-23: 09:00:00 500 [IU] via INTRAVENOUS

## 2018-10-23 NOTE — Progress Notes (Signed)
[facility]  [phone] [phone]   Name: [name] Date: 10/23/2018 MRN: [id number]  DOB: [birth year]  Patient Care Team: Tonia Ghent, MD as PCP - General (Family Medicine)    REASON FOR CONSULTATION: Palliative Care consult requested for this 84 y.o. female with multiple medical problems including stage IV esophageal cancer s/p chemotherapy, immunotherapy, and XRT with disease progression most recently on Keytruda. 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 Patient also has recurrent UTIs and was last hospitalized  04/12/2018  to 04/14/18 with same. Palliative care was consulted to help address goals.   SOCIAL HISTORY:     reports that she quit smoking about 43 years ago. Her smoking use included cigarettes. She has a 20.00 pack-year smoking history. She has never used smokeless tobacco. She reports that she does not drink alcohol or use drugs.   Patient is widowed.  She lives at home alone.  She has 2 sons, 1 of whom lives in [place] and the other in [place].  Patient is a retired [employer] account executive.  ADVANCE DIRECTIVES:  Patient's son, Yvone Neu, is her healthcare power of attorney.  CODE STATUS: DNR (MOST form completed on 10/23/18)  PAST MEDICAL HISTORY: Past Medical History:  Diagnosis Date  . Arthritis   . Asthma   . COPD (chronic obstructive pulmonary disease) ([place])   . Dysphonia   . Dyspnea   . Esophageal cancer ([place])   . GERD (gastroesophageal reflux disease)   . Heart murmur   . History of kidney stones   . Hypertension   . Hypokalemia 10/07/2017  . Melanoma ([place])   . OAB (overactive bladder)   . OSA on CPAP   . 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 Personal history of chemotherapy    esophageal cancer  . Personal history of radiation therapy    esophageal cancer  . Pupil asymmetry    R pupil defect  . Skin cancer   . Sleep apnea   . Spinal cord abscess     PAST SURGICAL HISTORY:  Past Surgical History:  Procedure Laterality Date  . ABDOMINAL HYSTERECTOMY    . ANTERIOR CERVICAL DECOMP/DISCECTOMY FUSION N/A 07/16/2013   Procedure: ANTERIOR CERVICAL DECOMPRESSION/DISCECTOMY FUSION mutlipleLEVELS C4-7;  Surgeon: Erline Levine, MD;  Location: [facility];  Service: Neurosurgery;  Laterality: N/A;  . APPENDECTOMY    . carpel tunn Bilateral   . CATARACT EXTRACTION    . CATARACT EXTRACTION, BILATERAL    . CHOLECYSTECTOMY    . dental implant    . ESOPHAGOGASTRODUODENOSCOPY Left 03/21/2017   Procedure: ESOPHAGOGASTRODUODENOSCOPY (EGD);  Surgeon: Virgel Manifold, MD;  Location: [facility] ENDOSCOPY;  Service: Endoscopy;  Laterality: Left;  . ESOPHAGOGASTRODUODENOSCOPY (EGD) WITH PROPOFOL N/A 08/20/2017   Procedure: ESOPHAGOGASTRODUODENOSCOPY (EGD) WITH PROPOFOL;  Surgeon: Virgel Manifold, MD;  Location: [facility] ENDOSCOPY;  Service: Endoscopy;  Laterality: N/A;  . ESOPHAGOGASTRODUODENOSCOPY (EGD) WITH PROPOFOL N/A 09/19/2017   Procedure: ESOPHAGOGASTRODUODENOSCOPY (EGD) WITH PROPOFOL;  Surgeon: Virgel Manifold, MD;  Location: [facility] ENDOSCOPY;  Service: Endoscopy;  Laterality: N/A;  . ESOPHAGOGASTRODUODENOSCOPY (EGD) WITH PROPOFOL N/A 10/03/2017   Procedure: ESOPHAGOGASTRODUODENOSCOPY (EGD) WITH PROPOFOL;  Surgeon: Virgel Manifold, MD;  Location: [facility] ENDOSCOPY;  Service: Endoscopy;  Laterality: N/A;  . ESOPHAGOGASTRODUODENOSCOPY (EGD) WITH PROPOFOL N/A 10/22/2017   Procedure: ESOPHAGOGASTRODUODENOSCOPY (EGD) WITH PROPOFOL;  Surgeon: Virgel Manifold, MD;  Location: [facility] ENDOSCOPY;  Service: Endoscopy;  Laterality: N/A;  . ESOPHAGOGASTRODUODENOSCOPY (EGD) WITH PROPOFOL N/A 11/18/2017   Procedure: ESOPHAGOGASTRODUODENOSCOPY (EGD) WITH PROPOFOL;  Surgeon: Virgel Manifold, MD;  Location: [facility] ENDOSCOPY;  Service: Endoscopy;  Laterality: N/A;  . ESOPHAGOGASTRODUODENOSCOPY  (EGD) WITH PROPOFOL N/A 12/17/2017   Procedure: ESOPHAGOGASTRODUODENOSCOPY (EGD) WITH PROPOFOL;  Surgeon: Virgel Manifold, MD;  Location: [facility] ENDOSCOPY;  Service: Endoscopy;  Laterality: N/A;  . ESOPHAGOGASTRODUODENOSCOPY (EGD) WITH PROPOFOL N/A 01/08/2018   Procedure: ESOPHAGOGASTRODUODENOSCOPY (EGD) WITH PROPOFOL;  Surgeon: Virgel Manifold, MD;  Location: [facility] ENDOSCOPY;  Service: Endoscopy;  Laterality: N/A;  . ESOPHAGOGASTRODUODENOSCOPY (EGD) WITH PROPOFOL N/A 01/22/2018   Procedure: ESOPHAGOGASTRODUODENOSCOPY (EGD) WITH PROPOFOL;  Surgeon: Virgel Manifold, MD;  Location: [facility] ENDOSCOPY;  Service: Endoscopy;  Laterality: N/A;  . ESOPHAGOGASTRODUODENOSCOPY (EGD) WITH PROPOFOL N/A 07/15/2018   Procedure: ESOPHAGOGASTRODUODENOSCOPY (EGD) WITH PROPOFOL;  Surgeon: Virgel Manifold, MD;  Location: [facility] ENDOSCOPY;  Service: Endoscopy;  Laterality: N/A;  . EYE SURGERY    . GASTRIC BYPASS    . HERNIA REPAIR    . HIATAL HERNIA REPAIR    . JOINT REPLACEMENT Bilateral    knee  . MELANOMA EXCISION    . PORTA CATH INSERTION N/A 04/07/2017   Procedure: PORTA CATH INSERTION;  Surgeon: Algernon Huxley, MD;  Location: [facility] CV LAB;  Service: Cardiovascular;  Laterality: N/A;  . POSTERIOR CERVICAL FUSION/FORAMINOTOMY N/A 07/28/2013   Procedure: Cervical four-seven  posterior cervical fusion;  Surgeon: Erline Levine, MD;  Location: [facility] NEURO ORS;  Service: Neurosurgery;  Laterality: N/A;  . TONSILLECTOMY    . TOTAL KNEE ARTHROPLASTY      HEMATOLOGY/ONCOLOGY HISTORY:  Oncology History Overview Note  Patient is a 84 year old female who has metastatic esophageal cancer with liver involvement.  Weight loss 20 pounds for the past year prior to diagnosis.  #EGD during admission showed partially obstructive esophageal mass at the distal part of esophagus. Biopsy was taken. Pathology showed esophageal adenocarcinoma. #US biopsy of liver lesion to proof distant metastasis.  Report family history  of breast cancer,but she has been having routine mammograms.  # Lives alone.She's a former smoker.  # Molecular Testing:  #[employer] Cdx: No reportable alternations with companion diagnostic (CDx) claims.  MS stable Tumor mutation burden: Cannot be determined, CCND3 amplification: Equivocal.  Amended reports on 06/04/2017, Tumor mutational burden changed from can not be determined to "TMB -low" on the re-analyzed samples.  # HER 2 negative.   Current Treatment S/p 6 cycles of FOLFOX, PET scan showed excellent partial response from both chemotherapy and radiation, result was discussed with patient.  currently on 5-FU maintenance.  S/p palliative radiation in January 2019 # Developed esophageal stricture secondary to radiation in April and got dilation via endoscopy. EGD shoed Benign-appearing esophageal stenosis. This is a result of radiation therapy.- LA Grade C radiation esophagitis. - The previously noted esophageal mass in Nov 2018 was much decreased indicating good response to radiation/chemotherapy. - Normal stomach.- Normal examined jejunum.- No specimens collected.      Malignant neoplasm of lower third of esophagus (HCC)  03/29/2017 Initial Diagnosis   Malignant neoplasm of lower third of esophagus ([place])   01/27/2018 - 05/24/2018 Chemotherapy   The patient had PACLitaxel (TAXOL) 162 mg in sodium chloride 0.9 % 250 mL chemo infusion (</= 39m/m2), 80 mg/m2 = 162 mg, Intravenous,  Once, 4 of 7 cycles Dose modification: 65 mg/m2 (original dose 80 mg/m2, Cycle 2, Reason: Provider Judgment), 70 mg/m2 (original dose 80 mg/m2, Cycle  4, Reason: Provider Judgment), 70 mg/m2 (original dose 80 mg/m2, Cycle 4, Reason: Provider Judgment) Administration: 162 mg (01/27/2018), 162 mg (03/02/2018), 132 mg (03/09/2018), 162 mg (02/03/2018), 162 mg (02/09/2018), 162 mg (02/23/2018), 162 mg (03/23/2018), 162 mg (03/30/2018), 162 mg (04/06/2018), 138 mg (04/27/2018), 138 mg (05/04/2018), 138 mg  (05/11/2018) ramucirumab (CYRAMZA) 700 mg in sodium chloride 0.9 % 180 mL chemo infusion, 8 mg/kg = 700 mg, Intravenous, Once, 4 of 7 cycles Administration: 700 mg (01/27/2018), 700 mg (03/09/2018), 700 mg (02/09/2018), 700 mg (02/23/2018), 700 mg (03/23/2018), 700 mg (04/06/2018), 700 mg (04/27/2018), 700 mg (05/11/2018)  for chemotherapy treatment.    08/24/2018 -  Chemotherapy   The patient had [No matching medication found in this treatment plan]  for chemotherapy treatment.    Esophageal adenocarcinoma ([place])   Initial Diagnosis   Esophageal adenocarcinoma ([place])   05/27/2018 - 08/18/2018 Chemotherapy   The patient had pembrolizumab (KEYTRUDA) 200 mg in sodium chloride 0.9 % 50 mL chemo infusion, 200 mg, Intravenous, Once, 4 of 5 cycles Administration: 200 mg (05/27/2018), 200 mg (06/17/2018), 200 mg (07/08/2018), 200 mg (07/29/2018)  for chemotherapy treatment.    08/24/2018 -  Chemotherapy   The patient had [No matching medication found in this treatment plan]  for chemotherapy treatment.      ALLERGIES:  is allergic to cefuroxime; codeine; doxycycline; gabapentin; iohexol; other; oxycodone; quinolones; and synvisc [hylan g-f 20].  MEDICATIONS:  Current Outpatient Medications  Medication Sig Dispense Refill  . albuterol (PROVENTIL HFA;VENTOLIN HFA) 108 (90 Base) MCG/ACT inhaler Inhale 2 puffs into the lungs every 4 (four) hours as needed for wheezing or shortness of breath.    Marland Kitchen aspirin EC 81 MG tablet Take 1 tablet (81 mg total) by mouth every other day.    . calcium carbonate (TUMS EX) 750 MG chewable tablet Chew 1 tablet by mouth as needed for heartburn.     . chlorhexidine (PERIDEX) 0.12 % solution Use as directed 15 mLs in the mouth or throat 2 (two) times daily.    . Cholecalciferol (VITAMIN D) 2000 UNITS CAPS Take 2,000 Units by mouth daily.    Marland Kitchen dexamethasone (DECADRON) 4 MG tablet Take 0.5 tablets (2 mg total) by mouth daily. 30 tablet 0  . Diclofenac Sodium 1 % CREA Apply to  left arm as needed for pain three times daily (Patient not taking: Reported on 09/23/2018) 120 g 0  . dronabinol (MARINOL) 5 MG capsule Take 1 capsule (5 mg total) by mouth 4 (four) times daily. 120 capsule 0  . fluticasone (FLONASE) 50 MCG/ACT nasal spray Place 1-2 sprays into both nostrils daily. 16 g 5  . furosemide (LASIX) 20 MG tablet TAKE 1-2 TABLETS BY MOUTH DAILY AS NEEDED FOR EDEMA 180 tablet 1  . hydrocortisone cream 1 % Apply 1 application topically daily as needed for itching.     . loratadine (CLARITIN) 10 MG tablet Take 10 mg by mouth daily.    . Multiple Vitamins-Minerals (MULTIVITAMIN ADULT) CHEW Chew 1 tablet by mouth daily.     Marland Kitchen MYRBETRIQ 25 MG TB24 tablet TAKE ONE TABLET EVERY DAY 30 tablet 5  . oxybutynin (DITROPAN) 5 MG tablet TAKE ONE TABLET BY MOUTH EVERY MORNING AND TAKE ONE-HALF TABLET AT BEDTIME 45 tablet 3  . pantoprazole (PROTONIX) 40 MG tablet TAKE 1 TABLET BY MOUTH TWICE DAILY BEFORE A MEAL 60 tablet 3  . Polyethyl Glycol-Propyl Glycol (SYSTANE OP) Apply 1 drop to eye daily as needed (dry eyes).    . potassium  chloride (KLOR-CON) 20 MEQ packet Take 20 mEq by mouth daily. 30 packet 0  . pregabalin (LYRICA) 150 MG capsule TAKE 1 CAPSULE TWICE DAILY 60 capsule 3  . prochlorperazine (COMPAZINE) 10 MG tablet TAKE ONE TABLET BY MOUTH EVERY 6 HOURS AS NEEDED FOR NAUSEA OR VOMITING 30 tablet 1  . pyridOXINE (VITAMIN B-6) 100 MG tablet Take 1 tablet (100 mg total) by mouth daily. 30 tablet 3  . SENNA-PLUS 8.6-50 MG tablet TAKE 2 TABLETS BY MOUTH DAILY 60 tablet 0  . sulfamethoxazole-trimethoprim (BACTRIM) 400-80 MG tablet Take 0.5 tablets by mouth 2 (two) times daily. 30 tablet 2  . traZODone (DESYREL) 50 MG tablet Take 0.5-1 tablets (25-50 mg total) by mouth at bedtime as needed for sleep.    Marland Kitchen trifluridine-tipiracil (LONSURF) 15-6.14 MG tablet Take 2 tablets by mouth two (2) times daily after a meal. Take with two '20mg'$  tablets. Take only on days 1-5, 8-12. Repeat every  28days. 40 tablet 3  . trifluridine-tipiracil (LONSURF) 20-8.19 MG tablet Take 2 tablets by mouth two (2) times daily after a meal. Take with two '15mg'$  tablets. Take only on days 1-5, 8-12. Repeat every 28days. 40 tablet 3   No current facility-administered medications for this visit.    Facility-Administered Medications Ordered in Other Visits  Medication Dose Route Frequency Provider Last Rate Last Dose  . sodium chloride flush (NS) 0.9 % injection 10 mL  10 mL Intravenous PRN Earlie Server, MD   10 mL at 01/26/18 [id number]    VITAL SIGNS: There were no vitals taken for this visit. There were no vitals filed for this visit.  Estimated body mass index is 33.25 kg/m as calculated from the following:   Height as of 07/24/18: '5\' 2"'$  (1.575 m).   Weight as of an earlier encounter on 10/23/18: 181 lb 12.8 oz (82.5 kg).  LABS: CBC:    Component Value Date/Time   WBC 1.6 (L) 10/23/2018 0850   HGB 8.4 (L) 10/23/2018 0850   HGB 12.9 07/13/2013 0609   HCT 28.2 (L) 10/23/2018 0850   HCT 38.8 07/13/2013 0609   PLT 198 10/23/2018 0850   PLT 212 07/13/2013 0609   MCV 101.4 (H) 10/23/2018 0850   MCV 93 07/13/2013 0609   NEUTROABS 0.8 (L) 10/23/2018 0850   LYMPHSABS 0.3 (L) 10/23/2018 0850   MONOABS 0.5 10/23/2018 0850   EOSABS 0.1 10/23/2018 0850   BASOSABS 0.0 10/23/2018 0850   Comprehensive Metabolic Panel:    Component Value Date/Time   NA 136 10/23/2018 0850   NA 138 07/13/2013 0609   K 3.0 (L) 10/23/2018 0850   K 4.1 07/13/2013 0609   CL 99 10/23/2018 0850   CL 104 07/13/2013 0609   CO2 24 10/23/2018 0850   CO2 29 07/13/2013 0609   BUN 11 10/23/2018 0850   BUN 15 07/13/2013 0609   CREATININE 1.00 10/23/2018 0850   CREATININE 0.67 07/13/2013 0609   GLUCOSE 184 (H) 10/23/2018 0850   GLUCOSE 115 (H) 07/13/2013 0609   CALCIUM 8.4 (L) 10/23/2018 0850   CALCIUM 8.3 (L) 07/13/2013 0609   AST 49 (H) 10/23/2018 0850   ALT 16 10/23/2018 0850   ALKPHOS 111 10/23/2018 0850   BILITOT 1.2  10/23/2018 0850   PROT 6.9 10/23/2018 0850   ALBUMIN 3.3 (L) 10/23/2018 0850    RADIOGRAPHIC STUDIES: No results found.  PERFORMANCE STATUS (ECOG) : 1 - Symptomatic but completely ambulatory  Review of Systems Unless otherwise noted, a complete review of systems is negative.  Physical Exam General: NAD, frail appearing, thin Pulmonary: unlabored, CTA ant fields Cards: RRR Extremities: 2+ pitting edema Skin: no rashes Neurological: Weakness but otherwise nonfocal  IMPRESSION: Routine follow-up visit made in the clinic today.  Patient states she feels reasonably good and denies any changes or concerns.  She denies any distressing symptoms.  She is planning a trip later this summer.  We discussed advance care planning.  Patient says that she would not want to be resuscitated or have her life prolonged artificially on machines.  She would be interested in treatment of any reversible conditions.  She would also be interested in hospitalization if necessary.  I completed a MOST form today. The patient and family outlined their wishes for the following treatment decisions:  Cardiopulmonary Resuscitation: Do Not Attempt Resuscitation (DNR/No CPR)  Medical Interventions: Limited Additional Interventions: Use medical treatment, IV fluids and cardiac monitoring as indicated, DO NOT USE intubation or mechanical ventilation. May consider use of less invasive airway support such as BiPAP or CPAP. Also provide comfort measures. Transfer to the hospital if indicated. Avoid intensive care.   Antibiotics: Antibiotics if indicated  IV Fluids: IV fluids if indicated  Feeding Tube: No feeding tube   PLAN: -Continue current scope of treatment -MOST form completed/DNR -RTC in 2 to 3 weeks   Patient expressed understanding and was in agreement with this plan. She also understands that She can call clinic at any time with any questions, concerns, or complaints.     Time Total: 20 minutes   Visit consisted of counseling and education dealing with the complex and emotionally intense issues of symptom management and palliative care in the setting of serious and potentially life-threatening illness.Greater than 50%  of this time was spent counseling and coordinating care related to the above assessment and plan.  Signed by: Altha Harm, PhD, NP-C [phone] (Work Cell)

## 2018-10-23 NOTE — Telephone Encounter (Signed)
I spoke with Robin at [facility] and he will let patient decide if she wants packet or tablet form of Potassium

## 2018-10-23 NOTE — Telephone Encounter (Signed)
Pharmacy called asking if we meant to order potassium packets as she usually gets the tablets. Please advise

## 2018-10-23 NOTE — Progress Notes (Signed)
Hematology/Oncology Follow Up Note [facility]  Telephone:([phone] [phone]  Patient Care Team: Tonia Ghent, MD as PCP - General (Family Medicine)   Name of the patient: [name]  [id number]  10-30-1934   REASON FOR VISIT  follow-up for chemotherapy management of esophageal adenocarcinoma,   HISTORY OF PRESENT ILLNESS Patient is a 40-old female who has metastatic esophageal cancer with liver involvement.  Weight loss 20 pounds for the past year prior to diagnosis.  # EGD during admission showed partially obstructive esophageal mass at the distal part of esophagus. Biopsy was taken. Pathology showed esophageal adenocarcinoma. #US biopsy of liver lesion to proof distant metastasis.  Report family history of breast cancer, but she has been having routine mammograms.  # Lives alone. She's a former smoker.  # Molecular Testing:  #[employer] Cdx: No reportable alternations with companion diagnostic (CDx) claims.  MS stable Tumor mutation burden: Cannot be determined, CCND3 amplification: Equivocal.  Amended reports on 06/04/2017, Tumor mutational burden changed from "can not be determined" to "TMB -low" on the re-analyzed samples.  Tumor Proportion Score [TPS] 20%. # HER 2 negative.   Antineoplasm Treatments S/p 6 cycles of FOLFOX, PET scan showed excellent partial response from both chemotherapy and radiation, result was discussed with patient.  Patient was switched to 5-FU maintenance and to September 2019. S/p palliative radiation in January 2019 # April 2019 Developed esophageal stricture secondary to radiation in April and got dilation via endoscopy. EGD shoed Benign-appearing esophageal stenosis. This is a result of radiation therapy.- LA Grade C radiation esophagitis. - The previously noted esophageal mass in Nov 2018 was much decreased indicating good response to radiation/chemotherapy. - Normal stomach.- Normal examined jejunum.-  No specimens collected.   #01/23/2018 PET scan showed interval development of loculated ascites within the upper abdomen with overlies the liver and the spleen and exhibited increased radio tracer uptake.  Worrisome for peritoneal carcinomatosis.  Soft tissue infiltration within the omentum with increased radiotracer uptake noted.  Worrisome for peritoneal carcinomatosis.  New hypermetabolic internal mammary lymph nodes and a right CP angle lymph node.  Suspicious for metastatic adenopathy.  Mild FDG uptake is identified localizing to the lower third of the esophagus raising SUV max of 5.15.  # 01/26/2018 2nd line treatment Taxol and Cyramza  12/31/20219  CT chest abdomen pelvis unfortunately showed mixed response. # 05/27/2018 3nd line treatment with pembrolizumab.  07/21/2018 MRI of brain showed a subacute stroke. Patient was advised to restart on aspirin 81 mg.  And to follow-up with primary care physician. Patient has been seen by PCP and had a discussion about subacute stroke.  Patient stopped using aspirin because of increased bruising.  Decision was made to take aspirin 81 mg every other day.  Statin was not started given patient's metastatic esophageal cancer setting and a limited life span.  Metoprolol was discontinued due to low blood pressure.  # 08/18/2018 CT chest abdomen pelvis showed progression.  08/31/2018 started on oral chemotherapy Lonsurf 35 mg/m BID, day 1-5, and day 8-12 every 28 cycles.  INTERVAL HISTORY  84 y.o. female with history of metastatic esophageal cancer present for evaluation while on oral chemotherapy LONGSURF for the treatment of esophageal cancer. Patient is due to start next cycle of Lonsurf next Monday. Patient reports pain is well controlled, she takes 1-2 tramadol as needed.  Pain level today is 1 out of 10. She has felt nauseated and has been using Zofran and Compazine as instructed with symptom  relievers. Appetite is poor.  Continue to lose  weight.  Currently taking 0.5 tablets of Bactrim 400/80 mg twice daily for UTI prophylaxis Today she denies any burning sensations, urgencies.  Chronic frequent urination. Lower extremity swelling, chronic   Review of Systems  Constitutional: Positive for appetite change, fatigue and unexpected weight change. Negative for chills and fever.  HENT:   Negative for hearing loss and voice change.   Eyes: Negative for eye problems.  Respiratory: Negative for chest tightness and cough.   Cardiovascular: Positive for leg swelling. Negative for chest pain.  Gastrointestinal: Positive for nausea. Negative for abdominal distention, abdominal pain and blood in stool.  Endocrine: Negative for hot flashes.  Genitourinary: Negative for difficulty urinating, dysuria and frequency.   Musculoskeletal: Negative for arthralgias and flank pain.  Skin: Negative for itching and rash.  Neurological: Positive for numbness. Negative for extremity weakness.  Hematological: Negative for adenopathy.  Psychiatric/Behavioral: Negative for confusion.     Allergies  Allergen Reactions   Cefuroxime Nausea Only   Codeine Nausea Only   Doxycycline Other (See Comments)    Lip swelling   Gabapentin Other (See Comments)    Swelling.    Iohexol Itching    07-15-13 pt developed itching on fingers after contrast given. Dr. Irish Elders looked at pt and said to put in system as allergy. BB   Other Other (See Comments)    Contrast Dye- caused fever, chills, awful feeling Bandages - itching, rash   Oxycodone Other (See Comments)    nausea   Quinolones Swelling    Lip swelling   Synvisc [Hylan G-F 20] Swelling     Past Medical History:  Diagnosis Date   Arthritis    Asthma    COPD (chronic obstructive pulmonary disease) (HCC)    Dysphonia    Dyspnea    Esophageal cancer (HCC)    GERD (gastroesophageal reflux disease)    Heart murmur    History of kidney stones    Hypertension    Hypokalemia  10/07/2017   Melanoma (HCC)    OAB (overactive bladder)    OSA on CPAP    Paresthesia    from neck down after spinal abscess   Personal history of chemotherapy    esophageal cancer   Personal history of radiation therapy    esophageal cancer   Pupil asymmetry    R pupil defect   Skin cancer    Sleep apnea    Spinal cord abscess      Past Surgical History:  Procedure Laterality Date   ABDOMINAL HYSTERECTOMY     ANTERIOR CERVICAL DECOMP/DISCECTOMY FUSION N/A 07/16/2013   Procedure: ANTERIOR CERVICAL DECOMPRESSION/DISCECTOMY FUSION mutlipleLEVELS C4-7;  Surgeon: Erline Levine, MD;  Location: [facility];  Service: Neurosurgery;  Laterality: N/A;   APPENDECTOMY     carpel tunn Bilateral    CATARACT EXTRACTION     CATARACT EXTRACTION, BILATERAL     CHOLECYSTECTOMY     dental implant     ESOPHAGOGASTRODUODENOSCOPY Left 03/21/2017   Procedure: ESOPHAGOGASTRODUODENOSCOPY (EGD);  Surgeon: Virgel Manifold, MD;  Location: [facility] ENDOSCOPY;  Service: Endoscopy;  Laterality: Left;   ESOPHAGOGASTRODUODENOSCOPY (EGD) WITH PROPOFOL N/A 08/20/2017   Procedure: ESOPHAGOGASTRODUODENOSCOPY (EGD) WITH PROPOFOL;  Surgeon: Virgel Manifold, MD;  Location: [facility] ENDOSCOPY;  Service: Endoscopy;  Laterality: N/A;   ESOPHAGOGASTRODUODENOSCOPY (EGD) WITH PROPOFOL N/A 09/19/2017   Procedure: ESOPHAGOGASTRODUODENOSCOPY (EGD) WITH PROPOFOL;  Surgeon: Virgel Manifold, MD;  Location: [facility] ENDOSCOPY;  Service: Endoscopy;  Laterality: N/A;  ESOPHAGOGASTRODUODENOSCOPY (EGD) WITH PROPOFOL N/A 10/03/2017   Procedure: ESOPHAGOGASTRODUODENOSCOPY (EGD) WITH PROPOFOL;  Surgeon: Virgel Manifold, MD;  Location: [facility] ENDOSCOPY;  Service: Endoscopy;  Laterality: N/A;   ESOPHAGOGASTRODUODENOSCOPY (EGD) WITH PROPOFOL N/A 10/22/2017   Procedure: ESOPHAGOGASTRODUODENOSCOPY (EGD) WITH PROPOFOL;  Surgeon: Virgel Manifold, MD;  Location: [facility] ENDOSCOPY;  Service: Endoscopy;  Laterality: N/A;    ESOPHAGOGASTRODUODENOSCOPY (EGD) WITH PROPOFOL N/A 11/18/2017   Procedure: ESOPHAGOGASTRODUODENOSCOPY (EGD) WITH PROPOFOL;  Surgeon: Virgel Manifold, MD;  Location: [facility] ENDOSCOPY;  Service: Endoscopy;  Laterality: N/A;   ESOPHAGOGASTRODUODENOSCOPY (EGD) WITH PROPOFOL N/A 12/17/2017   Procedure: ESOPHAGOGASTRODUODENOSCOPY (EGD) WITH PROPOFOL;  Surgeon: Virgel Manifold, MD;  Location: [facility] ENDOSCOPY;  Service: Endoscopy;  Laterality: N/A;   ESOPHAGOGASTRODUODENOSCOPY (EGD) WITH PROPOFOL N/A 01/08/2018   Procedure: ESOPHAGOGASTRODUODENOSCOPY (EGD) WITH PROPOFOL;  Surgeon: Virgel Manifold, MD;  Location: [facility] ENDOSCOPY;  Service: Endoscopy;  Laterality: N/A;   ESOPHAGOGASTRODUODENOSCOPY (EGD) WITH PROPOFOL N/A 01/22/2018   Procedure: ESOPHAGOGASTRODUODENOSCOPY (EGD) WITH PROPOFOL;  Surgeon: Virgel Manifold, MD;  Location: [facility] ENDOSCOPY;  Service: Endoscopy;  Laterality: N/A;   ESOPHAGOGASTRODUODENOSCOPY (EGD) WITH PROPOFOL N/A 07/15/2018   Procedure: ESOPHAGOGASTRODUODENOSCOPY (EGD) WITH PROPOFOL;  Surgeon: Virgel Manifold, MD;  Location: [facility] ENDOSCOPY;  Service: Endoscopy;  Laterality: N/A;   EYE SURGERY     GASTRIC BYPASS     HERNIA REPAIR     HIATAL HERNIA REPAIR     JOINT REPLACEMENT Bilateral    knee   MELANOMA EXCISION     PORTA CATH INSERTION N/A 04/07/2017   Procedure: PORTA CATH INSERTION;  Surgeon: Algernon Huxley, MD;  Location: [facility];  Service: Cardiovascular;  Laterality: N/A;   POSTERIOR CERVICAL FUSION/FORAMINOTOMY N/A 07/28/2013   Procedure: Cervical four-seven  posterior cervical fusion;  Surgeon: Erline Levine, MD;  Location: [facility];  Service: Neurosurgery;  Laterality: N/A;   TONSILLECTOMY     TOTAL KNEE ARTHROPLASTY      Social History   Socioeconomic History   Marital status: Widowed    Spouse name: Not on file   Number of children: 2   Years of education: Not on file   Highest education level: Master's degree  (e.g., MA, MS, MEng, MEd, MSW, MBA)  Occupational History   Not on file  Social Needs   Financial resource strain: Not hard at all   Food insecurity    Worry: Never true    Inability: Never true   Transportation needs    Medical: No    Non-medical: No  Tobacco Use   Smoking status: Former Smoker    Packs/day: 1.00    Years: 20.00    Pack years: 20.00    Types: Cigarettes    Quit date: 1977    Years since quitting: 43.4   Smokeless tobacco: Never Used  Substance and Sexual Activity   Alcohol use: No   Drug use: Never   Sexual activity: Not Currently  Lifestyle   Physical activity    Days per week: 2 days    Minutes per session: 30 min   Stress: Not at all  Relationships   Social connections    Talks on phone: More than three times a week    Gets together: More than three times a week    Attends religious service: More than 4 times per year    Active member of club or organization: Yes    Attends meetings of clubs or organizations: More than 4 times per year    Relationship  status: Widowed   Intimate partner violence    Fear of current or ex partner: No    Emotionally abused: No    Physically abused: No    Forced sexual activity: No  Other Topics Concern   Not on file  Social History Narrative   Marital Status- Widowed    Lives by herself    Architectural technologist- Retired Pharmacist, hospital   Exercise hx- Does PT     Family History  Problem Relation Age of Onset   Breast cancer Mother 30   Arthritis-Osteo Mother    Breast cancer Sister 66   Bladder Cancer Sister    Bladder Cancer Father    Tongue cancer Father    Congenital heart disease Father    Prostate cancer Brother    Uterine cancer Maternal Aunt    Lung cancer Paternal Uncle    Leukemia Maternal Grandmother    Liver cancer Paternal Grandmother    Colon cancer Neg Hx      Current Outpatient Medications:    albuterol (PROVENTIL HFA;VENTOLIN HFA) 108 (90 Base) MCG/ACT inhaler, Inhale 2  puffs into the lungs every 4 (four) hours as needed for wheezing or shortness of breath., Disp: , Rfl:    aspirin EC 81 MG tablet, Take 1 tablet (81 mg total) by mouth every other day., Disp: , Rfl:    calcium carbonate (TUMS EX) 750 MG chewable tablet, Chew 1 tablet by mouth as needed for heartburn. , Disp: , Rfl:    chlorhexidine (PERIDEX) 0.12 % solution, Use as directed 15 mLs in the mouth or throat 2 (two) times daily., Disp: , Rfl:    Cholecalciferol (VITAMIN D) 2000 UNITS CAPS, Take 2,000 Units by mouth daily., Disp: , Rfl:    dexamethasone (DECADRON) 4 MG tablet, Take 0.5 tablets (2 mg total) by mouth daily., Disp: 30 tablet, Rfl: 0   dronabinol (MARINOL) 5 MG capsule, Take 1 capsule (5 mg total) by mouth 4 (four) times daily., Disp: 120 capsule, Rfl: 0   fluticasone (FLONASE) 50 MCG/ACT nasal spray, Place 1-2 sprays into both nostrils daily., Disp: 16 g, Rfl: 5   furosemide (LASIX) 20 MG tablet, TAKE 1-2 TABLETS BY MOUTH DAILY AS NEEDED FOR EDEMA, Disp: 180 tablet, Rfl: 1   hydrocortisone cream 1 %, Apply 1 application topically daily as needed for itching. , Disp: , Rfl:    loratadine (CLARITIN) 10 MG tablet, Take 10 mg by mouth daily., Disp: , Rfl:    Multiple Vitamins-Minerals (MULTIVITAMIN ADULT) CHEW, Chew 1 tablet by mouth daily. , Disp: , Rfl:    MYRBETRIQ 25 MG TB24 tablet, TAKE ONE TABLET EVERY DAY, Disp: 30 tablet, Rfl: 5   oxybutynin (DITROPAN) 5 MG tablet, TAKE ONE TABLET BY MOUTH EVERY MORNING AND TAKE ONE-HALF TABLET AT BEDTIME, Disp: 45 tablet, Rfl: 3   pantoprazole (PROTONIX) 40 MG tablet, TAKE 1 TABLET BY MOUTH TWICE DAILY BEFORE A MEAL, Disp: 60 tablet, Rfl: 3   Polyethyl Glycol-Propyl Glycol (SYSTANE OP), Apply 1 drop to eye daily as needed (dry eyes)., Disp: , Rfl:    potassium chloride (KLOR-CON) 20 MEQ packet, Take 20 mEq by mouth daily., Disp: 30 packet, Rfl: 0   pregabalin (LYRICA) 150 MG capsule, TAKE 1 CAPSULE TWICE DAILY, Disp: 60 capsule, Rfl:  3   prochlorperazine (COMPAZINE) 10 MG tablet, TAKE ONE TABLET BY MOUTH EVERY 6 HOURS AS NEEDED FOR NAUSEA OR VOMITING, Disp: 30 tablet, Rfl: 1   pyridOXINE (VITAMIN B-6) 100 MG tablet, Take 1 tablet (100 mg  total) by mouth daily., Disp: 30 tablet, Rfl: 3   SENNA-PLUS 8.6-50 MG tablet, TAKE 2 TABLETS BY MOUTH DAILY, Disp: 60 tablet, Rfl: 0   sulfamethoxazole-trimethoprim (BACTRIM) 400-80 MG tablet, Take 0.5 tablets by mouth 2 (two) times daily., Disp: 30 tablet, Rfl: 2   traZODone (DESYREL) 50 MG tablet, Take 0.5-1 tablets (25-50 mg total) by mouth at bedtime as needed for sleep., Disp: , Rfl:    trifluridine-tipiracil (LONSURF) 15-6.14 MG tablet, Take 2 tablets by mouth two (2) times daily after a meal. Take with two 21m tablets. Take only on days 1-5, 8-12. Repeat every 28days., Disp: 40 tablet, Rfl: 3   trifluridine-tipiracil (LONSURF) 20-8.19 MG tablet, Take 2 tablets by mouth two (2) times daily after a meal. Take with two 14mtablets. Take only on days 1-5, 8-12. Repeat every 28days., Disp: 40 tablet, Rfl: 3   Diclofenac Sodium 1 % CREA, Apply to left arm as needed for pain three times daily (Patient not taking: Reported on 09/23/2018), Disp: 120 g, Rfl: 0 No current facility-administered medications for this visit.   Facility-Administered Medications Ordered in Other Visits:    sodium chloride flush (NS) 0.9 % injection 10 mL, 10 mL, Intravenous, PRN, YuEarlie ServerMD, 10 mL at 01/26/18 0904  Physical exam:   Today's Vitals   10/23/18 0917  BP: 125/76  Pulse: 92  Resp: 18  Temp: (!) 97.3 F (36.3 C)  TempSrc: Tympanic  Weight: 181 lb 12.8 oz (82.5 kg)  PainSc: 0-No pain   Body mass index is 33.25 kg/m. ECOG 2 Physical Exam  Constitutional: She is oriented to person, place, and time. No distress.  Sitting in the wheelchair.  Frail.  HENT:  Head: Normocephalic and atraumatic.  Nose: Nose normal.  Mouth/Throat: Oropharynx is clear and moist. No oropharyngeal exudate.   Eyes: Pupils are equal, round, and reactive to light. Conjunctivae and EOM are normal. No scleral icterus.  Neck: Normal range of motion. Neck supple. No JVD present.  Cardiovascular: Normal rate and regular rhythm.  Murmur heard. Pulmonary/Chest: Effort normal and breath sounds normal. No respiratory distress. She has no wheezes. She has no rales. She exhibits no tenderness.  Abdominal: Soft. Bowel sounds are normal. She exhibits no distension. There is no abdominal tenderness.  Musculoskeletal: Normal range of motion.        General: Edema present. No tenderness or deformity.  Lymphadenopathy:    She has no cervical adenopathy.  Neurological: She is alert and oriented to person, place, and time. No cranial nerve deficit. She exhibits normal muscle tone. Coordination normal.  Skin: Skin is warm and dry. She is not diaphoretic. No erythema.  Psychiatric: Affect and judgment normal.    CMP Latest Ref Rng & Units 10/23/2018  Glucose 70 - 99 mg/dL 184(H)  BUN 8 - 23 mg/dL 11  Creatinine 0.44 - 1.00 mg/dL 1.00  Sodium 135 - 145 mmol/L 136  Potassium 3.5 - 5.1 mmol/L 3.0(L)  Chloride 98 - 111 mmol/L 99  CO2 22 - 32 mmol/L 24  Calcium 8.9 - 10.3 mg/dL 8.4(L)  Total Protein 6.5 - 8.1 g/dL 6.9  Total Bilirubin 0.3 - 1.2 mg/dL 1.2  Alkaline Phos 38 - 126 U/L 111  AST 15 - 41 U/L 49(H)  ALT 0 - 44 U/L 16   CBC Latest Ref Rng & Units 10/23/2018  WBC 4.0 - 10.5 K/uL 1.6(L)  Hemoglobin 12.0 - 15.0 g/dL 8.4(L)  Hematocrit 36.0 - 46.0 % 28.2(L)  Platelets 150 - 400 K/uL  198   RADIOGRAPHIC STUDIES: I have personally reviewed the radiological images as listed and agreed with the findings in the report.  PET scan 01/23/2018  Interval development of loculated ascites within the upper abdomen which overlies the liver and spleen and exhibits increased radiotracer uptake. Findings worrisome for peritoneal carcinomatosis. Consider further evaluation with diagnostic paracentesis. 2. Soft tissue  infiltration within the omentum with increased radiotracer uptake noted. Also worrisome for peritoneal carcinomatosis. 3. New hypermetabolic internal mammary lymph nodes and right CP angle lymph node. Suspicious for metastatic adenopathy. 4. Mild FDG uptake is identified localizing to the lower third of the esophagus within SUV max of 5.15.  Bone Scan 05/08/2018  1. Several small foci of activity in anterior ribs as noted above, and metastatic involvement can not be excluded. 2. Somewhat mottled activity within the midthoracic and upper lumbar spine and metastatic involvement can not be excluded. Correlate with plain films or MRI preferably.  08/18/2018 CT chest abdomen pelvis with contrast 1. New and enlarging tumors throughout the liver compatible with progressive malignancy. 2. New 2.0 by 3.1 cm soft tissue density between the stomach in the liver, probably a malignant deposit/lymph node. 3. Stable mild circumferential wall thickening in the distal esophagus. Prior gastric bypass. 4. Other imaging findings of potential clinical significance: Aortic Atherosclerosis (ICD10-I70.0). Coronary atherosclerosis. Mild cardiomegaly. Aortic and mitral valve calcifications. Mild increase in right middle lobe atelectasis without obvious tumor in the right middle lobe. Scarring or atelectasis in the lung bases. New trace left pleural effusion. Thoracolumbar spondylosis and degenerative disc disease. Non-specific 3 mm hypodense lesion in the spleen. Stable left kidney lower pole staghorn calculus, nonobstructive. Cutaneous thickening in the right lower quadrant anterior abdominal wall, possibly from low-grade panniculitis.  Assessment and plan  Cancer Staging Malignant neoplasm of lower third of esophagus [facility]) Staging form: Esophagus - Adenocarcinoma, AJCC 8th Edition - Clinical stage from 04/10/2017: Stage IVB (cTX, cNX, pM1) - Signed by Earlie Server, MD on 04/10/2017  1. Malignant neoplasm of lower third of  esophagus (HCC)   2. Encounter for antineoplastic chemotherapy   3. Nausea without vomiting   4. Acute cystitis without hematuria   5. Weight loss   6. Insomnia, unspecified type    #Esophageal cancer, metastatic Currently on third line chemotherapy treatment with Lonsurf. CEA is not a marker anymore.  Tolerates with mild to moderate difficulties.  Hold next cycle of treatment. See below.   # Chemotherapy induced neutropenia. Hold next cycle of lonsurf.  Repeat blood work in 1 week for reassessment.  # Nausea continue antiemetics as instructed.  # weight loss, poor appetite. Continue Dexamethasone 51m daily. Continue marinol 560mQID.  # Recurrent UTI, continue prophylactic antibiotics.  # Depression continue trazodone 5086mvery evening.   # neoplasm related pain, continue Tramadol 18m90m hours as needed. Refill sent to pharmacy.  # Port A cath in place, flush Q6 weeks.  # We discussed about goal of care. Encourage patient to establish care with palliative care.   I called patient's son Ken Yvone Neu updated him above.  Follow-up on 6/22 for re-evaluation.  We spent sufficient time to discuss many aspect of care, questions were answered to patient's satisfaction. Total face to face encounter time for this patient visit was 25 min. >50% of the time was  spent in counseling and coordination of care.     ZhouEarlie Server, PhD

## 2018-10-23 NOTE — Progress Notes (Signed)
Patient here for follow up. Complains of feeling nauseated throughout the day and takes antiemetic, which helps. She is having bilateral ankle and feet swelling. Pt continues to take lonsurf.

## 2018-10-23 NOTE — Telephone Encounter (Signed)
I refilled an old prescription. If she prefers, I can change to tablets. Packets are fine as she can put into applesauce/pudding. No need for crushing. Easier to take.

## 2018-10-24 LAB — CEA: CEA: 2.7 ng/mL (ref 0.0–4.7)

## 2018-10-30 ENCOUNTER — Other Ambulatory Visit: Payer: Self-pay

## 2018-11-02 ENCOUNTER — Ambulatory Visit
Admission: RE | Admit: 2018-11-02 | Discharge: 2018-11-02 | Disposition: A | Discharge status: Home / Self Care | Payer: Medicare Other | Source: Ambulatory Visit | Attending: Radiation Oncology | Admitting: Radiation Oncology

## 2018-11-02 ENCOUNTER — Encounter: Payer: Self-pay | Admitting: Radiation Oncology

## 2018-11-02 ENCOUNTER — Inpatient Hospital Stay: Payer: Medicare Other

## 2018-11-02 ENCOUNTER — Inpatient Hospital Stay (HOSPITAL_BASED_OUTPATIENT_CLINIC_OR_DEPARTMENT_OTHER): Payer: Medicare Other | Admitting: Oncology

## 2018-11-02 ENCOUNTER — Encounter: Payer: Self-pay | Admitting: Oncology

## 2018-11-02 ENCOUNTER — Other Ambulatory Visit: Payer: Self-pay

## 2018-11-02 ENCOUNTER — Ambulatory Visit: Payer: Medicare Other | Admitting: Radiation Oncology

## 2018-11-02 ENCOUNTER — Inpatient Hospital Stay (HOSPITAL_BASED_OUTPATIENT_CLINIC_OR_DEPARTMENT_OTHER): Payer: Medicare Other | Admitting: Nurse Practitioner

## 2018-11-02 VITALS — BP 110/70 | HR 82 | Temp 98.4°F | Resp 18 | Wt 179.6 lb

## 2018-11-02 DIAGNOSIS — C159 Malignant neoplasm of esophagus, unspecified: Secondary | ICD-10-CM | POA: Diagnosis not present

## 2018-11-02 DIAGNOSIS — C787 Secondary malignant neoplasm of liver and intrahepatic bile duct: Secondary | ICD-10-CM

## 2018-11-02 DIAGNOSIS — D701 Agranulocytosis secondary to cancer chemotherapy: Secondary | ICD-10-CM

## 2018-11-02 DIAGNOSIS — F329 Major depressive disorder, single episode, unspecified: Secondary | ICD-10-CM

## 2018-11-02 DIAGNOSIS — C155 Malignant neoplasm of lower third of esophagus: Secondary | ICD-10-CM | POA: Insufficient documentation

## 2018-11-02 DIAGNOSIS — R634 Abnormal weight loss: Secondary | ICD-10-CM | POA: Diagnosis not present

## 2018-11-02 DIAGNOSIS — G4700 Insomnia, unspecified: Secondary | ICD-10-CM

## 2018-11-02 DIAGNOSIS — J449 Chronic obstructive pulmonary disease, unspecified: Secondary | ICD-10-CM

## 2018-11-02 DIAGNOSIS — Z7189 Other specified counseling: Secondary | ICD-10-CM

## 2018-11-02 DIAGNOSIS — Z79899 Other long term (current) drug therapy: Secondary | ICD-10-CM

## 2018-11-02 DIAGNOSIS — R109 Unspecified abdominal pain: Secondary | ICD-10-CM | POA: Insufficient documentation

## 2018-11-02 DIAGNOSIS — Z515 Encounter for palliative care: Secondary | ICD-10-CM | POA: Diagnosis not present

## 2018-11-02 DIAGNOSIS — Z8049 Family history of malignant neoplasm of other genital organs: Secondary | ICD-10-CM

## 2018-11-02 DIAGNOSIS — Z5111 Encounter for antineoplastic chemotherapy: Secondary | ICD-10-CM

## 2018-11-02 DIAGNOSIS — I10 Essential (primary) hypertension: Secondary | ICD-10-CM

## 2018-11-02 DIAGNOSIS — Z87891 Personal history of nicotine dependence: Secondary | ICD-10-CM

## 2018-11-02 DIAGNOSIS — Z7951 Long term (current) use of inhaled steroids: Secondary | ICD-10-CM

## 2018-11-02 DIAGNOSIS — Z923 Personal history of irradiation: Secondary | ICD-10-CM | POA: Insufficient documentation

## 2018-11-02 DIAGNOSIS — C772 Secondary and unspecified malignant neoplasm of intra-abdominal lymph nodes: Secondary | ICD-10-CM | POA: Insufficient documentation

## 2018-11-02 DIAGNOSIS — Z95828 Presence of other vascular implants and grafts: Secondary | ICD-10-CM

## 2018-11-02 DIAGNOSIS — G893 Neoplasm related pain (acute) (chronic): Secondary | ICD-10-CM

## 2018-11-02 DIAGNOSIS — Z803 Family history of malignant neoplasm of breast: Secondary | ICD-10-CM

## 2018-11-02 DIAGNOSIS — Z7982 Long term (current) use of aspirin: Secondary | ICD-10-CM

## 2018-11-02 DIAGNOSIS — Z8052 Family history of malignant neoplasm of bladder: Secondary | ICD-10-CM

## 2018-11-02 DIAGNOSIS — E46 Unspecified protein-calorie malnutrition: Secondary | ICD-10-CM

## 2018-11-02 DIAGNOSIS — Z801 Family history of malignant neoplasm of trachea, bronchus and lung: Secondary | ICD-10-CM

## 2018-11-02 LAB — CBC WITH DIFFERENTIAL/PLATELET
Abs Immature Granulocytes: 0.22 10*3/uL — ABNORMAL HIGH (ref 0.00–0.07)
Basophils Absolute: 0.1 10*3/uL (ref 0.0–0.1)
Basophils Relative: 1 %
Eosinophils Absolute: 0 10*3/uL (ref 0.0–0.5)
Eosinophils Relative: 0 %
HCT: 28.7 % — ABNORMAL LOW (ref 36.0–46.0)
Hemoglobin: 8.6 g/dL — ABNORMAL LOW (ref 12.0–15.0)
Immature Granulocytes: 3 %
Lymphocytes Relative: 5 %
Lymphs Abs: 0.4 10*3/uL — ABNORMAL LOW (ref 0.7–4.0)
MCH: 30.1 pg (ref 26.0–34.0)
MCHC: 30 g/dL (ref 30.0–36.0)
MCV: 100.3 fL — ABNORMAL HIGH (ref 80.0–100.0)
Monocytes Absolute: 0.6 10*3/uL (ref 0.1–1.0)
Monocytes Relative: 8 %
Neutro Abs: 6.2 10*3/uL (ref 1.7–7.7)
Neutrophils Relative %: 83 %
Platelets: 276 10*3/uL (ref 150–400)
RBC: 2.86 MIL/uL — ABNORMAL LOW (ref 3.87–5.11)
RDW: 21.4 % — ABNORMAL HIGH (ref 11.5–15.5)
WBC: 7.5 10*3/uL (ref 4.0–10.5)
nRBC: 0 % (ref 0.0–0.2)

## 2018-11-02 LAB — COMPREHENSIVE METABOLIC PANEL
ALT: 22 U/L (ref 0–44)
AST: 65 U/L — ABNORMAL HIGH (ref 15–41)
Albumin: 3.2 g/dL — ABNORMAL LOW (ref 3.5–5.0)
Alkaline Phosphatase: 114 U/L (ref 38–126)
Anion gap: 11 (ref 5–15)
BUN: 12 mg/dL (ref 8–23)
CO2: 28 mmol/L (ref 22–32)
Calcium: 8.7 mg/dL — ABNORMAL LOW (ref 8.9–10.3)
Chloride: 97 mmol/L — ABNORMAL LOW (ref 98–111)
Creatinine, Ser: 0.94 mg/dL (ref 0.44–1.00)
GFR calc Af Amer: >60 mL/min (ref >60)
GFR calc non Af Amer: 57 mL/min — ABNORMAL LOW (ref >60)
Glucose, Bld: 165 mg/dL — ABNORMAL HIGH (ref 70–99)
Potassium: 4.2 mmol/L (ref 3.5–5.1)
Sodium: 136 mmol/L (ref 135–145)
Total Bilirubin: 0.9 mg/dL (ref 0.3–1.2)
Total Protein: 7 g/dL (ref 6.5–8.1)

## 2018-11-02 NOTE — Progress Notes (Signed)
Radiation Oncology Follow up Note  Name: [name]   Date:   11/02/2018 MRN:  [id number] DOB: April 08, 1935    This 84 y.o. female presents to the clinic today for 1-1/2-year follow-up status post concurrent chemoradiation therapy for distal esophageal adenocarcinoma.  REFERRING PROVIDER: Tonia Ghent, MD  HPI: Patient is an 84 year old female now about a year and a half having completed concurrent chemoradiation for distal esophageal adenocarcinoma..  She has lost considerable weight is having some mild abdominal pain.  She is dealing with progressive liver metastasis and a small mass adjacent to the stomach probably again malignant nodal disease.  She is on third line chemotherapy with Lonsurf.  And tolerated that fairly well except for neutropenia.  She recently had a CT scan back in April again showing new enlarging tumors throughout the liver as well as a 3 cm soft tissue density between the stomach and liver compatible with nodal metastasis.  COMPLICATIONS OF TREATMENT: none  FOLLOW UP COMPLIANCE: keeps appointments   PHYSICAL EXAM:  BP (P) 139/75 (BP Location: Left Arm, Patient Position: Sitting)   Pulse (P) 98   Wt (P) 179 lb 9 oz (81.4 kg)   BMI (P) 32.84 kg/m  Well-developed well-nourished patient in NAD. HEENT reveals PERLA, EOMI, discs not visualized.  Oral cavity is clear. No oral mucosal lesions are identified. Neck is clear without evidence of cervical or supraclavicular adenopathy. Lungs are clear to A&P. Cardiac examination is essentially unremarkable with regular rate and rhythm without murmur rub or thrill. Abdomen is benign with no organomegaly or masses noted. Motor sensory and DTR levels are equal and symmetric in the upper and lower extremities. Cranial nerves II through XII are grossly intact. Proprioception is intact. No peripheral adenopathy or edema is identified. No motor or sensory levels are noted. Crude visual fields are within normal range.  RADIOLOGY  RESULTS: CT scans reviewed compatible with above-stated findings  PLAN: Present time patient is dealing with stage IV disease with enlarging liver metastasis.  She states her abdominal pain is not not that much of an issue and I would not recommend palliative treatment to that nodal metastasis at this time.  She continues chemotherapy treatment with medical oncology.  I have asked to see her back in 6 months.  I would be happy to reevaluate the patient anytime should further palliative treatment be indicated.  I would like to take this opportunity to thank you for allowing me to participate in the care of your patient.Noreene Filbert, MD

## 2018-11-02 NOTE — Progress Notes (Signed)
Patient here for follow up. Continues on antibiotic for UTI. States appetite is poor.

## 2018-11-02 NOTE — Progress Notes (Signed)
[facility]  Telephone:([phone] [phone]   Name: [name] Date: 11/02/2018 MRN: [id number]  DOB: [birth year]  Patient Care Team: Tonia Ghent, MD as PCP - General (Family Medicine)    REASON FOR CONSULTATION: Palliative Care consult requested for this 84 y.o. female with multiple medical problems including stage IV esophageal cancer s/p chemotherapy, immunotherapy, and XRT with disease progression most recently on Keytruda. 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 Patient also has recurrent UTIs and was last hospitalized  04/12/2018  to 04/14/18 with same. Palliative care was consulted to help address goals.   SOCIAL HISTORY:     reports that she quit smoking about 43 years ago. Her smoking use included cigarettes. She has a 20.00 pack-year smoking history. She has never used smokeless tobacco. She reports that she does not drink alcohol or use drugs.   Patient is widowed.  She lives at home alone.  She has 2 sons, 1 of whom lives in [place] and the other in [place].  Patient is a retired [employer] account executive.  ADVANCE DIRECTIVES:  Patient's son, Yvone Neu, is her healthcare power of attorney.  Cardiopulmonary Resuscitation: Do Not Attempt Resuscitation (DNR/No CPR)  Medical Interventions: Limited Additional Interventions: Use medical treatment, IV fluids and cardiac monitoring as indicated, DO NOT USE intubation or mechanical ventilation. May consider use of less invasive airway support such as BiPAP or CPAP. Also provide comfort measures. Transfer to the hospital if indicated. Avoid intensive care.   Antibiotics: Antibiotics if indicated  IV Fluids: IV fluids if indicated  Feeding Tube: No feeding tube   CODE STATUS: DNR (MOST form completed on 10/23/18)  PAST MEDICAL HISTORY: Past Medical History:  Diagnosis Date  . Arthritis    . Asthma   . COPD (chronic obstructive pulmonary disease) ([place])   . Dysphonia   . Dyspnea   . Esophageal cancer ([place])   . GERD (gastroesophageal reflux disease)   . Heart murmur   . History of kidney stones   . Hypertension   . Hypokalemia 10/07/2017  . Melanoma ([place])   . OAB (overactive bladder)   . OSA on CPAP   . 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 Personal history of chemotherapy    esophageal cancer  . Personal history of radiation therapy    esophageal cancer  . Pupil asymmetry    R pupil defect  . Skin cancer   . Sleep apnea   . Spinal cord abscess     PAST SURGICAL HISTORY:  Past Surgical History:  Procedure Laterality Date  . ABDOMINAL HYSTERECTOMY    . ANTERIOR CERVICAL DECOMP/DISCECTOMY FUSION N/A 07/16/2013   Procedure: ANTERIOR CERVICAL DECOMPRESSION/DISCECTOMY FUSION mutlipleLEVELS C4-7;  Surgeon: Erline Levine, MD;  Location: [facility]URO ORS;  Service: Neurosurgery;  Laterality: N/A;  . APPENDECTOMY    . carpel tunn Bilateral   . CATARACT EXTRACTION    . CATARACT EXTRACTION, BILATERAL    . CHOLECYSTECTOMY    . dental implant    . ESOPHAGOGASTRODUODENOSCOPY Left 03/21/2017   Procedure: ESOPHAGOGASTRODUODENOSCOPY (EGD);  Surgeon: Virgel Manifold, MD;  Location: [facility] ENDOSCOPY;  Service: Endoscopy;  Laterality: Left;  . ESOPHAGOGASTRODUODENOSCOPY (EGD) WITH PROPOFOL N/A 08/20/2017   Procedure: ESOPHAGOGASTRODUODENOSCOPY (EGD) WITH PROPOFOL;  Surgeon: Virgel Manifold, MD;  Location: [facility] ENDOSCOPY;  Service: Endoscopy;  Laterality: N/A;  . ESOPHAGOGASTRODUODENOSCOPY (EGD) WITH  PROPOFOL N/A 09/19/2017   Procedure: ESOPHAGOGASTRODUODENOSCOPY (EGD) WITH PROPOFOL;  Surgeon: Virgel Manifold, MD;  Location: [facility] ENDOSCOPY;  Service: Endoscopy;  Laterality: N/A;  . ESOPHAGOGASTRODUODENOSCOPY (EGD) WITH PROPOFOL N/A 10/03/2017   Procedure: ESOPHAGOGASTRODUODENOSCOPY (EGD) WITH PROPOFOL;  Surgeon: Virgel Manifold, MD;  Location: [facility]  ENDOSCOPY;  Service: Endoscopy;  Laterality: N/A;  . ESOPHAGOGASTRODUODENOSCOPY (EGD) WITH PROPOFOL N/A 10/22/2017   Procedure: ESOPHAGOGASTRODUODENOSCOPY (EGD) WITH PROPOFOL;  Surgeon: Virgel Manifold, MD;  Location: [facility] ENDOSCOPY;  Service: Endoscopy;  Laterality: N/A;  . ESOPHAGOGASTRODUODENOSCOPY (EGD) WITH PROPOFOL N/A 11/18/2017   Procedure: ESOPHAGOGASTRODUODENOSCOPY (EGD) WITH PROPOFOL;  Surgeon: Virgel Manifold, MD;  Location: [facility] ENDOSCOPY;  Service: Endoscopy;  Laterality: N/A;  . ESOPHAGOGASTRODUODENOSCOPY (EGD) WITH PROPOFOL N/A 12/17/2017   Procedure: ESOPHAGOGASTRODUODENOSCOPY (EGD) WITH PROPOFOL;  Surgeon: Virgel Manifold, MD;  Location: [facility] ENDOSCOPY;  Service: Endoscopy;  Laterality: N/A;  . ESOPHAGOGASTRODUODENOSCOPY (EGD) WITH PROPOFOL N/A 01/08/2018   Procedure: ESOPHAGOGASTRODUODENOSCOPY (EGD) WITH PROPOFOL;  Surgeon: Virgel Manifold, MD;  Location: [facility] ENDOSCOPY;  Service: Endoscopy;  Laterality: N/A;  . ESOPHAGOGASTRODUODENOSCOPY (EGD) WITH PROPOFOL N/A 01/22/2018   Procedure: ESOPHAGOGASTRODUODENOSCOPY (EGD) WITH PROPOFOL;  Surgeon: Virgel Manifold, MD;  Location: [facility] ENDOSCOPY;  Service: Endoscopy;  Laterality: N/A;  . ESOPHAGOGASTRODUODENOSCOPY (EGD) WITH PROPOFOL N/A 07/15/2018   Procedure: ESOPHAGOGASTRODUODENOSCOPY (EGD) WITH PROPOFOL;  Surgeon: Virgel Manifold, MD;  Location: [facility] ENDOSCOPY;  Service: Endoscopy;  Laterality: N/A;  . EYE SURGERY    . GASTRIC BYPASS    . HERNIA REPAIR    . HIATAL HERNIA REPAIR    . JOINT REPLACEMENT Bilateral    knee  . MELANOMA EXCISION    . PORTA CATH INSERTION N/A 04/07/2017   Procedure: PORTA CATH INSERTION;  Surgeon: Algernon Huxley, MD;  Location: [facility] CV LAB;  Service: Cardiovascular;  Laterality: N/A;  . POSTERIOR CERVICAL FUSION/FORAMINOTOMY N/A 07/28/2013   Procedure: Cervical four-seven  posterior cervical fusion;  Surgeon: Erline Levine, MD;  Location: [facility] NEURO ORS;  Service: Neurosurgery;   Laterality: N/A;  . TONSILLECTOMY    . TOTAL KNEE ARTHROPLASTY      HEMATOLOGY/ONCOLOGY HISTORY:  Oncology History Overview Note  Patient is a 84 year old female who has metastatic esophageal cancer with liver involvement.  Weight loss 20 pounds for the past year prior to diagnosis.  #EGD during admission showed partially obstructive esophageal mass at the distal part of esophagus. Biopsy was taken. Pathology showed esophageal adenocarcinoma. #US biopsy of liver lesion to proof distant metastasis.  Report family history of breast cancer,but she has been having routine mammograms.  # Lives alone.She's a former smoker.  # Molecular Testing:  #[employer] Cdx: No reportable alternations with companion diagnostic (CDx) claims.  MS stable Tumor mutation burden: Cannot be determined, CCND3 amplification: Equivocal.  Amended reports on 06/04/2017, Tumor mutational burden changed from can not be determined to "TMB -low" on the re-analyzed samples.  # HER 2 negative.   Current Treatment S/p 6 cycles of FOLFOX, PET scan showed excellent partial response from both chemotherapy and radiation, result was discussed with patient.  currently on 5-FU maintenance.  S/p palliative radiation in January 2019 # Developed esophageal stricture secondary to radiation in April and got dilation via endoscopy. EGD shoed Benign-appearing esophageal stenosis. This is a result of radiation therapy.- LA Grade C radiation esophagitis. - The previously noted esophageal mass in Nov 2018 was much decreased indicating good response to radiation/chemotherapy. - Normal stomach.- Normal examined jejunum.- No specimens collected.      Malignant  neoplasm of lower third of esophagus (HCC)  03/29/2017 Initial Diagnosis   Malignant neoplasm of lower third of esophagus ([place])   01/27/2018 - 05/24/2018 Chemotherapy   The patient had PACLitaxel (TAXOL) 162 mg in sodium chloride 0.9 % 250 mL chemo infusion (</= 65m/m2), 80 mg/m2  = 162 mg, Intravenous,  Once, 4 of 7 cycles Dose modification: 65 mg/m2 (original dose 80 mg/m2, Cycle 2, Reason: Provider Judgment), 70 mg/m2 (original dose 80 mg/m2, Cycle 4, Reason: Provider Judgment), 70 mg/m2 (original dose 80 mg/m2, Cycle 4, Reason: Provider Judgment) Administration: 162 mg (01/27/2018), 162 mg (03/02/2018), 132 mg (03/09/2018), 162 mg (02/03/2018), 162 mg (02/09/2018), 162 mg (02/23/2018), 162 mg (03/23/2018), 162 mg (03/30/2018), 162 mg (04/06/2018), 138 mg (04/27/2018), 138 mg (05/04/2018), 138 mg (05/11/2018) ramucirumab (CYRAMZA) 700 mg in sodium chloride 0.9 % 180 mL chemo infusion, 8 mg/kg = 700 mg, Intravenous, Once, 4 of 7 cycles Administration: 700 mg (01/27/2018), 700 mg (03/09/2018), 700 mg (02/09/2018), 700 mg (02/23/2018), 700 mg (03/23/2018), 700 mg (04/06/2018), 700 mg (04/27/2018), 700 mg (05/11/2018)  for chemotherapy treatment.    08/24/2018 -  Chemotherapy   The patient had [No matching medication found in this treatment plan]  for chemotherapy treatment.    Esophageal adenocarcinoma ([place]   Initial Diagnosis   Esophageal adenocarcinoma ([place]   05/27/2018 - 08/18/2018 Chemotherapy   The patient had pembrolizumab (KEYTRUDA) 200 mg in sodium chloride 0.9 % 50 mL chemo infusion, 200 mg, Intravenous, Once, 4 of 5 cycles Administration: 200 mg (05/27/2018), 200 mg (06/17/2018), 200 mg (07/08/2018), 200 mg (07/29/2018)  for chemotherapy treatment.    08/24/2018 -  Chemotherapy   The patient had [No matching medication found in this treatment plan]  for chemotherapy treatment.      ALLERGIES:  is allergic to cefuroxime; codeine; doxycycline; gabapentin; iohexol; other; oxycodone; quinolones; and synvisc [hylan g-f 20].  MEDICATIONS:  Current Outpatient Medications  Medication Sig Dispense Refill  . albuterol (PROVENTIL HFA;VENTOLIN HFA) 108 (90 Base) MCG/ACT inhaler Inhale 2 puffs into the lungs every 4 (four) hours as needed for wheezing or shortness of breath.     .Marland Kitchenaspirin EC 81 MG tablet Take 1 tablet (81 mg total) by mouth every other day.    . calcium carbonate (TUMS EX) 750 MG chewable tablet Chew 1 tablet by mouth as needed for heartburn.     . chlorhexidine (PERIDEX) 0.12 % solution Use as directed 15 mLs in the mouth or throat 2 (two) times daily.    . Cholecalciferol (VITAMIN D) 2000 UNITS CAPS Take 2,000 Units by mouth daily.    .Marland Kitchendexamethasone (DECADRON) 4 MG tablet Take 0.5 tablets (2 mg total) by mouth daily. 30 tablet 0  . Diclofenac Sodium 1 % CREA Apply to left arm as needed for pain three times daily (Patient not taking: Reported on 09/23/2018) 120 g 0  . dronabinol (MARINOL) 5 MG capsule Take 1 capsule (5 mg total) by mouth 4 (four) times daily. 120 capsule 0  . fluticasone (FLONASE) 50 MCG/ACT nasal spray Place 1-2 sprays into both nostrils daily. 16 g 5  . furosemide (LASIX) 20 MG tablet TAKE 1-2 TABLETS BY MOUTH DAILY AS NEEDED FOR EDEMA 180 tablet 1  . hydrocortisone cream 1 % Apply 1 application topically daily as needed for itching.     . loratadine (CLARITIN) 10 MG tablet Take 10 mg by mouth daily.    . Multiple Vitamins-Minerals (MULTIVITAMIN ADULT) CHEW Chew 1 tablet by mouth daily.     .Marland Kitchen  MYRBETRIQ 25 MG TB24 tablet TAKE ONE TABLET EVERY DAY 30 tablet 5  . oxybutynin (DITROPAN) 5 MG tablet TAKE ONE TABLET BY MOUTH EVERY MORNING AND TAKE ONE-HALF TABLET AT BEDTIME 45 tablet 3  . pantoprazole (PROTONIX) 40 MG tablet TAKE 1 TABLET BY MOUTH TWICE DAILY BEFORE A MEAL 60 tablet 3  . Polyethyl Glycol-Propyl Glycol (SYSTANE OP) Apply 1 drop to eye daily as needed (dry eyes).    . potassium chloride (KLOR-CON) 20 MEQ packet Take 20 mEq by mouth daily. 30 packet 0  . pregabalin (LYRICA) 150 MG capsule TAKE 1 CAPSULE TWICE DAILY 60 capsule 3  . prochlorperazine (COMPAZINE) 10 MG tablet TAKE ONE TABLET BY MOUTH EVERY 6 HOURS AS NEEDED FOR NAUSEA OR VOMITING 30 tablet 1  . pyridOXINE (VITAMIN B-6) 100 MG tablet Take 1 tablet (100 mg total) by  mouth daily. 30 tablet 3  . SENNA-PLUS 8.6-50 MG tablet TAKE 2 TABLETS BY MOUTH DAILY 60 tablet 0  . sulfamethoxazole-trimethoprim (BACTRIM) 400-80 MG tablet Take 0.5 tablets by mouth 2 (two) times daily. 30 tablet 2  . traZODone (DESYREL) 50 MG tablet Take 0.5-1 tablets (25-50 mg total) by mouth at bedtime as needed for sleep.    Marland Kitchen trifluridine-tipiracil (LONSURF) 15-6.14 MG tablet Take 2 tablets by mouth two (2) times daily after a meal. Take with two 65m tablets. Take only on days 1-5, 8-12. Repeat every 28days. (Patient not taking: Reported on 11/02/2018) 40 tablet 3  . trifluridine-tipiracil (LONSURF) 20-8.19 MG tablet Take 2 tablets by mouth two (2) times daily after a meal. Take with two 129mtablets. Take only on days 1-5, 8-12. Repeat every 28days. (Patient not taking: Reported on 11/02/2018) 40 tablet 3   No current facility-administered medications for this visit.    Facility-Administered Medications Ordered in Other Visits  Medication Dose Route Frequency Provider Last Rate Last Dose  . sodium chloride flush (NS) 0.9 % injection 10 mL  10 mL Intravenous PRN YuEarlie ServerMD   10 mL at 01/26/18 091610  VITAL SIGNS: There were no vitals taken for this visit. There were no vitals filed for this visit.  Estimated body mass index is 32.85 kg/m as calculated from the following:   Height as of 07/24/18: '5\' 2"'  (1.575 m).   Weight as of an earlier encounter on 11/02/18: 179 lb 9.6 oz (81.5 kg).  LABS: CBC:    Component Value Date/Time   WBC 7.5 11/02/2018 1357   HGB 8.6 (L) 11/02/2018 1357   HGB 12.9 07/13/2013 0609   HCT 28.7 (L) 11/02/2018 1357   HCT 38.8 07/13/2013 0609   PLT 276 11/02/2018 1357   PLT 212 07/13/2013 0609   MCV 100.3 (H) 11/02/2018 1357   MCV 93 07/13/2013 0609   NEUTROABS 6.2 11/02/2018 1357   LYMPHSABS 0.4 (L) 11/02/2018 1357   MONOABS 0.6 11/02/2018 1357   EOSABS 0.0 11/02/2018 1357   BASOSABS 0.1 11/02/2018 1357   Comprehensive Metabolic Panel:     Component Value Date/Time   NA 136 11/02/2018 1357   NA 138 07/13/2013 0609   K 4.2 11/02/2018 1357   K 4.1 07/13/2013 0609   CL 97 (L) 11/02/2018 1357   CL 104 07/13/2013 0609   CO2 28 11/02/2018 1357   CO2 29 07/13/2013 0609   BUN 12 11/02/2018 1357   BUN 15 07/13/2013 0609   CREATININE 0.94 11/02/2018 1357   CREATININE 0.67 07/13/2013 0609   GLUCOSE 165 (H) 11/02/2018 1357   GLUCOSE 115 (  H) 07/13/2013 0609   CALCIUM 8.7 (L) 11/02/2018 1357   CALCIUM 8.3 (L) 07/13/2013 0609   AST 65 (H) 11/02/2018 1357   ALT 22 11/02/2018 1357   ALKPHOS 114 11/02/2018 1357   BILITOT 0.9 11/02/2018 1357   PROT 7.0 11/02/2018 1357   ALBUMIN 3.2 (L) 11/02/2018 1357    RADIOGRAPHIC STUDIES: No results found.  PERFORMANCE STATUS (ECOG) : 1 - Symptomatic but completely ambulatory  Review of Systems  Constitutional: Positive for appetite change. Negative for activity change, chills, fatigue, fever and unexpected weight change.       Poor appetite  HENT: Negative for dental problem, mouth sores and trouble swallowing.   Eyes: Negative for pain and redness.  Respiratory: Negative for cough and shortness of breath.   Cardiovascular: Negative for chest pain and leg swelling.  Gastrointestinal: Negative for abdominal pain, anal bleeding, constipation, diarrhea, nausea and vomiting.  Endocrine: Negative for cold intolerance and heat intolerance.  Genitourinary: Negative for decreased urine volume, difficulty urinating, dysuria, flank pain, frequency, genital sores, pelvic pain and urgency.  Musculoskeletal: Negative for arthralgias, gait problem and myalgias.       Cane  Skin: Negative for rash and wound.  Neurological: Negative for dizziness, weakness, light-headedness and headaches.  Hematological: Negative for adenopathy.  Psychiatric/Behavioral: Negative for confusion and sleep disturbance. The patient is nervous/anxious.   All other systems reviewed and are negative.  Unless otherwise  noted, a complete review of systems is negative.  Physical Exam Constitutional:      General: She is not in acute distress.    Comments: Frail appearing. Thin  HENT:     Head: Normocephalic and atraumatic.     Nose: No rhinorrhea.     Mouth/Throat:     Mouth: Mucous membranes are moist.     Pharynx: Oropharynx is clear.  Eyes:     General: No scleral icterus.    Conjunctiva/sclera: Conjunctivae normal.  Cardiovascular:     Rate and Rhythm: Normal rate and regular rhythm.  Pulmonary:     Effort: Pulmonary effort is normal.     Breath sounds: Normal breath sounds.  Abdominal:     Palpations: Abdomen is soft.     Tenderness: There is no abdominal tenderness.  Musculoskeletal:        General: No signs of injury.     Comments: cane  Skin:    General: Skin is warm and dry.  Neurological:     Mental Status: She is alert and oriented to person, place, and time.     Motor: No weakness.  Psychiatric:        Mood and Affect: Mood normal.        Behavior: Behavior normal.     IMPRESSION: Routine follow-up visit made in the clinic today. She saw Dr. Tasia Catchings today as well.   Patient feels at baseline. She says she feels anxious seeing palliative care because she feels well. She says she always equates palliative care to hospice because her husband saw PC when he was at EOL at hospice home. I again discussed differences in hospice and PC and services available through home vs outpatient PC.   She feels at baseline today. She continues tramadol. Has some constipation which she has been using colace for without significant improvement. She's concerned of her reduced appetite and taste changes. She has seen dietician previously. Today we discussed dietary interventions such as small frequent meals and ample protein intake. Also discussed staying active.   She has not  yet activated mychart account and requests assistance doing this today which was provided.   PLAN: -Continue current scope of  treatment - start daily miralax for goal BM daily to every day to every other day without pain or straining - continue tramadol for pain. No refill needed today. - rtc in 2-3 weeks for follow-up  Patient expressed understanding and was in agreement with this plan. She also understands that She can call clinic at any time with any questions, concerns, or complaints.   Time Total: 20 minutes  Visit consisted of counseling and education dealing with the complex and emotionally intense issues of symptom management and palliative care in the setting of serious and potentially life-threatening illness.Greater than 50%  of this time was spent counseling and coordinating care related to the above assessment and plan.  Signed by: Beckey Rutter, DNP, AGNP-C [facility] (work cell) [phone] (office)  CC: Dr. Tasia Catchings

## 2018-11-03 NOTE — Progress Notes (Signed)
Hematology/Oncology Follow Up Note [facility]  Telephone:([phone] [phone]  Patient Care Team: Tonia Ghent, MD as PCP - General (Family Medicine)   Name of the patient: [name]  [id number]  Mar 24, 1935   REASON FOR VISIT  follow-up for chemotherapy management of esophageal adenocarcinoma,   HISTORY OF PRESENT ILLNESS Patient is a 42-old female who has metastatic esophageal cancer with liver involvement.  Weight loss 20 pounds for the past year prior to diagnosis.  # EGD during admission showed partially obstructive esophageal mass at the distal part of esophagus. Biopsy was taken. Pathology showed esophageal adenocarcinoma. #US biopsy of liver lesion to proof distant metastasis.  Report family history of breast cancer, but she has been having routine mammograms.  # Lives alone. She's a former smoker.  # Molecular Testing:  #[employer] Cdx: No reportable alternations with companion diagnostic (CDx) claims.  MS stable Tumor mutation burden: Cannot be determined, CCND3 amplification: Equivocal.  Amended reports on 06/04/2017, Tumor mutational burden changed from "can not be determined" to "TMB -low" on the re-analyzed samples.  Tumor Proportion Score [TPS] 20%. # HER 2 negative.   Antineoplasm Treatments S/p 6 cycles of FOLFOX, PET scan showed excellent partial response from both chemotherapy and radiation, result was discussed with patient.  Patient was switched to 5-FU maintenance and to September 2019. S/p palliative radiation in January 2019 # April 2019 Developed esophageal stricture secondary to radiation in April and got dilation via endoscopy. EGD shoed Benign-appearing esophageal stenosis. This is a result of radiation therapy.- LA Grade C radiation esophagitis. - The previously noted esophageal mass in Nov 2018 was much decreased indicating good response to radiation/chemotherapy. - Normal stomach.- Normal examined jejunum.-  No specimens collected.   #01/23/2018 PET scan showed interval development of loculated ascites within the upper abdomen with overlies the liver and the spleen and exhibited increased radio tracer uptake.  Worrisome for peritoneal carcinomatosis.  Soft tissue infiltration within the omentum with increased radiotracer uptake noted.  Worrisome for peritoneal carcinomatosis.  New hypermetabolic internal mammary lymph nodes and a right CP angle lymph node.  Suspicious for metastatic adenopathy.  Mild FDG uptake is identified localizing to the lower third of the esophagus raising SUV max of 5.15.  # 01/26/2018 2nd line treatment Taxol and Cyramza  12/31/20219  CT chest abdomen pelvis unfortunately showed mixed response. # 05/27/2018 3nd line treatment with pembrolizumab.  07/21/2018 MRI of brain showed a subacute stroke. Patient was advised to restart on aspirin 81 mg.  And to follow-up with primary care physician. Patient has been seen by PCP and had a discussion about subacute stroke.  Patient stopped using aspirin because of increased bruising.  Decision was made to take aspirin 81 mg every other day.  Statin was not started given patient's metastatic esophageal cancer setting and a limited life span.  Metoprolol was discontinued due to low blood pressure.  # 08/18/2018 CT chest abdomen pelvis showed progression.  08/31/2018 started on oral chemotherapy Lonsurf 35 mg/m BID, day 1-5, and day 8-12 every 28 cycles.  INTERVAL HISTORY  84 y.o. female with history of metastatic esophageal cancer present for evaluation while on oral chemotherapy LONGSURF for the treatment of esophageal cancer. Last week she was supposed to start lonsurf and was delayed due to neutropenia.  She feels " ok" today.  Appetite continues to be poor.  Denies any pain. Takes Tramadol if needed.  She is taking 0.5 tablets of Bactrim BID for UTI prophylaxis.  Chronic urgency. No burning sensation.  Lost 2 pounds since last week.     Review of Systems  Constitutional: Positive for appetite change, fatigue and unexpected weight change. Negative for chills and fever.  HENT:   Negative for hearing loss and voice change.   Eyes: Negative for eye problems.  Respiratory: Negative for chest tightness and cough.   Cardiovascular: Positive for leg swelling. Negative for chest pain.  Gastrointestinal: Negative for abdominal distention, abdominal pain, blood in stool and nausea.  Endocrine: Negative for hot flashes.  Genitourinary: Positive for frequency. Negative for difficulty urinating and dysuria.   Musculoskeletal: Negative for arthralgias and flank pain.  Skin: Negative for itching and rash.  Neurological: Positive for numbness. Negative for extremity weakness.  Hematological: Negative for adenopathy.  Psychiatric/Behavioral: Negative for confusion.     Allergies  Allergen Reactions   Cefuroxime Nausea Only   Codeine Nausea Only   Doxycycline Other (See Comments)    Lip swelling   Gabapentin Other (See Comments)    Swelling.    Iohexol Itching    07-15-13 pt developed itching on fingers after contrast given. Dr. Irish Elders looked at pt and said to put in system as allergy. BB   Other Other (See Comments)    Contrast Dye- caused fever, chills, awful feeling Bandages - itching, rash   Oxycodone Other (See Comments)    nausea   Quinolones Swelling    Lip swelling   Synvisc [Hylan G-F 20] Swelling     Past Medical History:  Diagnosis Date   Arthritis    Asthma    COPD (chronic obstructive pulmonary disease) (HCC)    Dysphonia    Dyspnea    Esophageal cancer (HCC)    GERD (gastroesophageal reflux disease)    Heart murmur    History of kidney stones    Hypertension    Hypokalemia 10/07/2017   Melanoma (HCC)    OAB (overactive bladder)    OSA on CPAP    Paresthesia    from neck down after spinal abscess   Personal history of chemotherapy    esophageal cancer   Personal history of  radiation therapy    esophageal cancer   Pupil asymmetry    R pupil defect   Skin cancer    Sleep apnea    Spinal cord abscess      Past Surgical History:  Procedure Laterality Date   ABDOMINAL HYSTERECTOMY     ANTERIOR CERVICAL DECOMP/DISCECTOMY FUSION N/A 07/16/2013   Procedure: ANTERIOR CERVICAL DECOMPRESSION/DISCECTOMY FUSION mutlipleLEVELS C4-7;  Surgeon: Erline Levine, MD;  Location: [facility];  Service: Neurosurgery;  Laterality: N/A;   APPENDECTOMY     carpel tunn Bilateral    CATARACT EXTRACTION     CATARACT EXTRACTION, BILATERAL     CHOLECYSTECTOMY     dental implant     ESOPHAGOGASTRODUODENOSCOPY Left 03/21/2017   Procedure: ESOPHAGOGASTRODUODENOSCOPY (EGD);  Surgeon: Virgel Manifold, MD;  Location: [facility]COPY;  Service: Endoscopy;  Laterality: Left;   ESOPHAGOGASTRODUODENOSCOPY (EGD) WITH PROPOFOL N/A 08/20/2017   Procedure: ESOPHAGOGASTRODUODENOSCOPY (EGD) WITH PROPOFOL;  Surgeon: Virgel Manifold, MD;  Location: [facility] ENDOSCOPY;  Service: Endoscopy;  Laterality: N/A;   ESOPHAGOGASTRODUODENOSCOPY (EGD) WITH PROPOFOL N/A 09/19/2017   Procedure: ESOPHAGOGASTRODUODENOSCOPY (EGD) WITH PROPOFOL;  Surgeon: Virgel Manifold, MD;  Location: [facility] ENDOSCOPY;  Service: Endoscopy;  Laterality: N/A;   ESOPHAGOGASTRODUODENOSCOPY (EGD) WITH PROPOFOL N/A 10/03/2017   Procedure: ESOPHAGOGASTRODUODENOSCOPY (EGD) WITH PROPOFOL;  Surgeon: Virgel Manifold, MD;  Location: [facility] ENDOSCOPY;  Service:  Endoscopy;  Laterality: N/A;   ESOPHAGOGASTRODUODENOSCOPY (EGD) WITH PROPOFOL N/A 10/22/2017   Procedure: ESOPHAGOGASTRODUODENOSCOPY (EGD) WITH PROPOFOL;  Surgeon: Virgel Manifold, MD;  Location: [facility] ENDOSCOPY;  Service: Endoscopy;  Laterality: N/A;   ESOPHAGOGASTRODUODENOSCOPY (EGD) WITH PROPOFOL N/A 11/18/2017   Procedure: ESOPHAGOGASTRODUODENOSCOPY (EGD) WITH PROPOFOL;  Surgeon: Virgel Manifold, MD;  Location: [facility] ENDOSCOPY;  Service: Endoscopy;   Laterality: N/A;   ESOPHAGOGASTRODUODENOSCOPY (EGD) WITH PROPOFOL N/A 12/17/2017   Procedure: ESOPHAGOGASTRODUODENOSCOPY (EGD) WITH PROPOFOL;  Surgeon: Virgel Manifold, MD;  Location: [facility] ENDOSCOPY;  Service: Endoscopy;  Laterality: N/A;   ESOPHAGOGASTRODUODENOSCOPY (EGD) WITH PROPOFOL N/A 01/08/2018   Procedure: ESOPHAGOGASTRODUODENOSCOPY (EGD) WITH PROPOFOL;  Surgeon: Virgel Manifold, MD;  Location: [facility] ENDOSCOPY;  Service: Endoscopy;  Laterality: N/A;   ESOPHAGOGASTRODUODENOSCOPY (EGD) WITH PROPOFOL N/A 01/22/2018   Procedure: ESOPHAGOGASTRODUODENOSCOPY (EGD) WITH PROPOFOL;  Surgeon: Virgel Manifold, MD;  Location: [facility] ENDOSCOPY;  Service: Endoscopy;  Laterality: N/A;   ESOPHAGOGASTRODUODENOSCOPY (EGD) WITH PROPOFOL N/A 07/15/2018   Procedure: ESOPHAGOGASTRODUODENOSCOPY (EGD) WITH PROPOFOL;  Surgeon: Virgel Manifold, MD;  Location: [facility] ENDOSCOPY;  Service: Endoscopy;  Laterality: N/A;   EYE SURGERY     GASTRIC BYPASS     HERNIA REPAIR     HIATAL HERNIA REPAIR     JOINT REPLACEMENT Bilateral    knee   MELANOMA EXCISION     PORTA CATH INSERTION N/A 04/07/2017   Procedure: PORTA CATH INSERTION;  Surgeon: Algernon Huxley, MD;  Location: [place] [facility];  Service: Cardiovascular;  Laterality: N/A;   POSTERIOR CERVICAL FUSION/FORAMINOTOMY N/A 07/28/2013   Procedure: Cervical four-seven  posterior cervical fusion;  Surgeon: Erline Levine, MD;  Location: [place] [facility];  Service: Neurosurgery;  Laterality: N/A;   TONSILLECTOMY     TOTAL KNEE ARTHROPLASTY      Social History   Socioeconomic History   Marital status: Widowed    Spouse name: Not on file   Number of children: 2   Years of education: Not on file   Highest education level: Master's degree (e.g., MA, MS, MEng, MEd, MSW, MBA)  Occupational History   Not on file  Social Needs   Financial resource strain: Not hard at all   Food insecurity    Worry: Never true    Inability: Never true    Transportation needs    Medical: No    Non-medical: No  Tobacco Use   Smoking status: Former Smoker    Packs/day: 1.00    Years: 20.00    Pack years: 20.00    Types: Cigarettes    Quit date: 1977    Years since quitting: 43.5   Smokeless tobacco: Never Used  Substance and Sexual Activity   Alcohol use: No   Drug use: Never   Sexual activity: Not Currently  Lifestyle   Physical activity    Days per week: 2 days    Minutes per session: 30 min   Stress: Not at all  Relationships   Social connections    Talks on phone: More than three times a week    Gets together: More than three times a week    Attends religious service: More than 4 times per year    Active member of club or organization: Yes    Attends meetings of clubs or organizations: More than 4 times per year    Relationship status: Widowed   Intimate partner violence    Fear of current or ex partner: No    Emotionally abused: No  Physically abused: No    Forced sexual activity: No  Other Topics Concern   Not on file  Social History Narrative   Marital Status- Widowed    Lives by herself    Employement- Retired Pharmacist, hospital   Exercise hx- Does PT     Family History  Problem Relation Age of Onset   Breast cancer Mother 80   Arthritis-Osteo Mother    Breast cancer Sister 31   Bladder Cancer Sister    Bladder Cancer Father    Tongue cancer Father    Congenital heart disease Father    Prostate cancer Brother    Uterine cancer Maternal Aunt    Lung cancer Paternal Uncle    Leukemia Maternal Grandmother    Liver cancer Paternal Grandmother    Colon cancer Neg Hx      Current Outpatient Medications:    albuterol (PROVENTIL HFA;VENTOLIN HFA) 108 (90 Base) MCG/ACT inhaler, Inhale 2 puffs into the lungs every 4 (four) hours as needed for wheezing or shortness of breath., Disp: , Rfl:    aspirin EC 81 MG tablet, Take 1 tablet (81 mg total) by mouth every other day., Disp: , Rfl:     calcium carbonate (TUMS EX) 750 MG chewable tablet, Chew 1 tablet by mouth as needed for heartburn. , Disp: , Rfl:    Cholecalciferol (VITAMIN D) 2000 UNITS CAPS, Take 2,000 Units by mouth daily., Disp: , Rfl:    dexamethasone (DECADRON) 4 MG tablet, Take 0.5 tablets (2 mg total) by mouth daily., Disp: 30 tablet, Rfl: 0   dronabinol (MARINOL) 5 MG capsule, Take 1 capsule (5 mg total) by mouth 4 (four) times daily., Disp: 120 capsule, Rfl: 0   fluticasone (FLONASE) 50 MCG/ACT nasal spray, Place 1-2 sprays into both nostrils daily., Disp: 16 g, Rfl: 5   furosemide (LASIX) 20 MG tablet, TAKE 1-2 TABLETS BY MOUTH DAILY AS NEEDED FOR EDEMA, Disp: 180 tablet, Rfl: 1   loratadine (CLARITIN) 10 MG tablet, Take 10 mg by mouth daily., Disp: , Rfl:    Multiple Vitamins-Minerals (MULTIVITAMIN ADULT) CHEW, Chew 1 tablet by mouth daily. , Disp: , Rfl:    MYRBETRIQ 25 MG TB24 tablet, TAKE ONE TABLET EVERY DAY, Disp: 30 tablet, Rfl: 5   oxybutynin (DITROPAN) 5 MG tablet, TAKE ONE TABLET BY MOUTH EVERY MORNING AND TAKE ONE-HALF TABLET AT BEDTIME, Disp: 45 tablet, Rfl: 3   pantoprazole (PROTONIX) 40 MG tablet, TAKE 1 TABLET BY MOUTH TWICE DAILY BEFORE A MEAL, Disp: 60 tablet, Rfl: 3   Polyethyl Glycol-Propyl Glycol (SYSTANE OP), Apply 1 drop to eye daily as needed (dry eyes)., Disp: , Rfl:    potassium chloride (KLOR-CON) 20 MEQ packet, Take 20 mEq by mouth daily., Disp: 30 packet, Rfl: 0   pregabalin (LYRICA) 150 MG capsule, TAKE 1 CAPSULE TWICE DAILY, Disp: 60 capsule, Rfl: 3   prochlorperazine (COMPAZINE) 10 MG tablet, TAKE ONE TABLET BY MOUTH EVERY 6 HOURS AS NEEDED FOR NAUSEA OR VOMITING, Disp: 30 tablet, Rfl: 1   pyridOXINE (VITAMIN B-6) 100 MG tablet, Take 1 tablet (100 mg total) by mouth daily., Disp: 30 tablet, Rfl: 3   SENNA-PLUS 8.6-50 MG tablet, TAKE 2 TABLETS BY MOUTH DAILY, Disp: 60 tablet, Rfl: 0   sulfamethoxazole-trimethoprim (BACTRIM) 400-80 MG tablet, Take 0.5 tablets by mouth 2  (two) times daily., Disp: 30 tablet, Rfl: 2   traZODone (DESYREL) 50 MG tablet, Take 0.5-1 tablets (25-50 mg total) by mouth at bedtime as needed for sleep., Disp: , Rfl:  chlorhexidine (PERIDEX) 0.12 % solution, Use as directed 15 mLs in the mouth or throat 2 (two) times daily., Disp: , Rfl:    Diclofenac Sodium 1 % CREA, Apply to left arm as needed for pain three times daily (Patient not taking: Reported on 09/23/2018), Disp: 120 g, Rfl: 0   hydrocortisone cream 1 %, Apply 1 application topically daily as needed for itching. , Disp: , Rfl:    trifluridine-tipiracil (LONSURF) 15-6.14 MG tablet, Take 2 tablets by mouth two (2) times daily after a meal. Take with two 66m tablets. Take only on days 1-5, 8-12. Repeat every 28days. (Patient not taking: Reported on 11/02/2018), Disp: 40 tablet, Rfl: 3   trifluridine-tipiracil (LONSURF) 20-8.19 MG tablet, Take 2 tablets by mouth two (2) times daily after a meal. Take with two 178mtablets. Take only on days 1-5, 8-12. Repeat every 28days. (Patient not taking: Reported on 11/02/2018), Disp: 40 tablet, Rfl: 3 No current facility-administered medications for this visit.   Facility-Administered Medications Ordered in Other Visits:    sodium chloride flush (NS) 0.9 % injection 10 mL, 10 mL, Intravenous, PRN, YuEarlie ServerMD, 10 mL at 01/26/18 0904  Physical exam:   Today's Vitals   11/02/18 1452  BP: 110/70  Pulse: 82  Resp: 18  Temp: 98.4 F (36.9 C)  Weight: 179 lb 9.6 oz (81.5 kg)  PainSc: 0-No pain   Body mass index is 32.85 kg/m. ECOG 2 Physical Exam  Constitutional: She is oriented to person, place, and time. No distress.  Frail.  HENT:  Head: Normocephalic and atraumatic.  Nose: Nose normal.  Mouth/Throat: Oropharynx is clear and moist. No oropharyngeal exudate.  Eyes: Pupils are equal, round, and reactive to light. Conjunctivae and EOM are normal. No scleral icterus.  Neck: Normal range of motion. Neck supple. No JVD present.    Cardiovascular: Normal rate and regular rhythm.  Murmur heard. Pulmonary/Chest: Effort normal and breath sounds normal. No respiratory distress. She has no wheezes. She has no rales. She exhibits no tenderness.  Abdominal: Soft. Bowel sounds are normal. She exhibits no distension. There is no abdominal tenderness.  Musculoskeletal: Normal range of motion.        General: Edema present. No tenderness or deformity.  Lymphadenopathy:    She has no cervical adenopathy.  Neurological: She is alert and oriented to person, place, and time. No cranial nerve deficit. She exhibits normal muscle tone. Coordination normal.  Skin: Skin is warm and dry. She is not diaphoretic. No erythema.  Psychiatric: Affect and judgment normal.    CMP Latest Ref Rng & Units 11/02/2018  Glucose 70 - 99 mg/dL 165(H)  BUN 8 - 23 mg/dL 12  Creatinine 0.44 - 1.00 mg/dL 0.94  Sodium 135 - 145 mmol/L 136  Potassium 3.5 - 5.1 mmol/L 4.2  Chloride 98 - 111 mmol/L 97(L)  CO2 22 - 32 mmol/L 28  Calcium 8.9 - 10.3 mg/dL 8.7(L)  Total Protein 6.5 - 8.1 g/dL 7.0  Total Bilirubin 0.3 - 1.2 mg/dL 0.9  Alkaline Phos 38 - 126 U/L 114  AST 15 - 41 U/L 65(H)  ALT 0 - 44 U/L 22   CBC Latest Ref Rng & Units 11/02/2018  WBC 4.0 - 10.5 K/uL 7.5  Hemoglobin 12.0 - 15.0 g/dL 8.6(L)  Hematocrit 36.0 - 46.0 % 28.7(L)  Platelets 150 - 400 K/uL 276   RADIOGRAPHIC STUDIES: I have personally reviewed the radiological images as listed and agreed with the findings in the report.  PET scan  01/23/2018  Interval development of loculated ascites within the upper abdomen which overlies the liver and spleen and exhibits increased radiotracer uptake. Findings worrisome for peritoneal carcinomatosis. Consider further evaluation with diagnostic paracentesis. 2. Soft tissue infiltration within the omentum with increased radiotracer uptake noted. Also worrisome for peritoneal carcinomatosis. 3. New hypermetabolic internal mammary lymph nodes and  right CP angle lymph node. Suspicious for metastatic adenopathy. 4. Mild FDG uptake is identified localizing to the lower third of the esophagus within SUV max of 5.15.  Bone Scan 05/08/2018  1. Several small foci of activity in anterior ribs as noted above, and metastatic involvement can not be excluded. 2. Somewhat mottled activity within the midthoracic and upper lumbar spine and metastatic involvement can not be excluded. Correlate with plain films or MRI preferably.  08/18/2018 CT chest abdomen pelvis with contrast 1. New and enlarging tumors throughout the liver compatible with progressive malignancy. 2. New 2.0 by 3.1 cm soft tissue density between the stomach in the liver, probably a malignant deposit/lymph node. 3. Stable mild circumferential wall thickening in the distal esophagus. Prior gastric bypass. 4. Other imaging findings of potential clinical significance: Aortic Atherosclerosis (ICD10-I70.0). Coronary atherosclerosis. Mild cardiomegaly. Aortic and mitral valve calcifications. Mild increase in right middle lobe atelectasis without obvious tumor in the right middle lobe. Scarring or atelectasis in the lung bases. New trace left pleural effusion. Thoracolumbar spondylosis and degenerative disc disease. Non-specific 3 mm hypodense lesion in the spleen. Stable left kidney lower pole staghorn calculus, nonobstructive. Cutaneous thickening in the right lower quadrant anterior abdominal wall, possibly from low-grade panniculitis.  Assessment and plan  Cancer Staging Malignant neoplasm of lower third of esophagus [facility]) Staging form: Esophagus - Adenocarcinoma, AJCC 8th Edition - Clinical stage from 04/10/2017: Stage IVB (cTX, cNX, pM1) - Signed by Earlie Server, MD on 04/10/2017  1. Esophageal adenocarcinoma ([place])   2. Encounter for antineoplastic chemotherapy   3. Weight loss   4. Goals of care, counseling/discussion   5. Protein-calorie malnutrition, unspecified severity ([place])   6.  Port-A-Cath in place    #Esophageal cancer, metastatic,CEA is not a marker anymore. On 4th line treatment.  Labs reviewed and discussed with patient.   Counts acceptable to proceed with cycle 4 Lonsurf.  #Neutropenia, chemotherapy-induced.  Resolved. #Weight loss, poor appetite, continue dexamethasone 2 mg daily.  Continue Marinol 5 mg 4 times daily. #Recurrent UTI, continue prophylactic antibiotics .#Depression, continue trazodone 50 mg every evening. #Port-A-Cath in place, flush every 6 weeks  I had a discussion with patient's son as well over the phone. Patient is going to have family reunion and the patient's son is worried about potential side effects due to the cycles of Lonsurf.  We discussed about the dates when the patient will be on this cycle of Lonsurf and the time patient has family reunion will be the fourth week of current cycle.  Son feels this is acceptable. Agrees with the plan of proceeding with chemotherapy today. We will see patient in 2 weeks repeat blood work and proceed with supportive care  Follow up with palliative care.   Follow-up in 2 weeks. for re-evaluation.     Earlie Server, MD, PhD

## 2018-11-05 ENCOUNTER — Other Ambulatory Visit: Payer: Self-pay | Admitting: Oncology

## 2018-11-10 ENCOUNTER — Telehealth: Payer: Self-pay | Admitting: Gastroenterology

## 2018-11-10 NOTE — Telephone Encounter (Signed)
[name] with [facility] called & stated they received a request to schedule a endoscopy u/s for the patient.However when their doctor was reviewing the notes noticed that the patient had had a recent Endoscopy. Does the patient still need the Endoscopy with the u/s? Please call [name].

## 2018-11-12 NOTE — Telephone Encounter (Signed)
Please advise does pt still need endoscopy [place]?

## 2018-11-17 ENCOUNTER — Other Ambulatory Visit: Payer: Self-pay

## 2018-11-17 ENCOUNTER — Telehealth: Payer: Self-pay | Admitting: Oncology

## 2018-11-17 NOTE — Telephone Encounter (Signed)
Spoke with pt to confirm appt date/time, do pre-appt screen which was completed, and adv of Covid-19 guidelines for appt regarding screening questions, temperature check, face mask required, and no visitors allowed °

## 2018-11-18 ENCOUNTER — Inpatient Hospital Stay: Payer: Medicare Other

## 2018-11-18 ENCOUNTER — Inpatient Hospital Stay (HOSPITAL_BASED_OUTPATIENT_CLINIC_OR_DEPARTMENT_OTHER): Payer: Medicare Other | Admitting: Oncology

## 2018-11-18 ENCOUNTER — Encounter: Payer: Self-pay | Admitting: Oncology

## 2018-11-18 ENCOUNTER — Other Ambulatory Visit: Payer: Self-pay

## 2018-11-18 ENCOUNTER — Other Ambulatory Visit: Payer: Self-pay | Admitting: Oncology

## 2018-11-18 ENCOUNTER — Inpatient Hospital Stay: Payer: Medicare Other | Attending: Oncology | Admitting: *Deleted

## 2018-11-18 VITALS — BP 119/70 | HR 72 | Temp 97.1°F | Wt 175.1 lb

## 2018-11-18 DIAGNOSIS — D6481 Anemia due to antineoplastic chemotherapy: Secondary | ICD-10-CM | POA: Diagnosis not present

## 2018-11-18 DIAGNOSIS — Z9884 Bariatric surgery status: Secondary | ICD-10-CM | POA: Insufficient documentation

## 2018-11-18 DIAGNOSIS — Z87891 Personal history of nicotine dependence: Secondary | ICD-10-CM | POA: Insufficient documentation

## 2018-11-18 DIAGNOSIS — T451X5D Adverse effect of antineoplastic and immunosuppressive drugs, subsequent encounter: Secondary | ICD-10-CM | POA: Insufficient documentation

## 2018-11-18 DIAGNOSIS — Z85820 Personal history of malignant melanoma of skin: Secondary | ICD-10-CM

## 2018-11-18 DIAGNOSIS — C158 Malignant neoplasm of overlapping sites of esophagus: Secondary | ICD-10-CM | POA: Insufficient documentation

## 2018-11-18 DIAGNOSIS — C159 Malignant neoplasm of esophagus, unspecified: Secondary | ICD-10-CM

## 2018-11-18 DIAGNOSIS — J449 Chronic obstructive pulmonary disease, unspecified: Secondary | ICD-10-CM

## 2018-11-18 DIAGNOSIS — N390 Urinary tract infection, site not specified: Secondary | ICD-10-CM

## 2018-11-18 DIAGNOSIS — Z5111 Encounter for antineoplastic chemotherapy: Secondary | ICD-10-CM

## 2018-11-18 DIAGNOSIS — E43 Unspecified severe protein-calorie malnutrition: Secondary | ICD-10-CM | POA: Diagnosis not present

## 2018-11-18 DIAGNOSIS — F329 Major depressive disorder, single episode, unspecified: Secondary | ICD-10-CM | POA: Insufficient documentation

## 2018-11-18 DIAGNOSIS — Z7982 Long term (current) use of aspirin: Secondary | ICD-10-CM

## 2018-11-18 DIAGNOSIS — R634 Abnormal weight loss: Secondary | ICD-10-CM

## 2018-11-18 DIAGNOSIS — N179 Acute kidney failure, unspecified: Secondary | ICD-10-CM | POA: Insufficient documentation

## 2018-11-18 DIAGNOSIS — D649 Anemia, unspecified: Secondary | ICD-10-CM

## 2018-11-18 DIAGNOSIS — Z95828 Presence of other vascular implants and grafts: Secondary | ICD-10-CM

## 2018-11-18 DIAGNOSIS — C787 Secondary malignant neoplasm of liver and intrahepatic bile duct: Secondary | ICD-10-CM

## 2018-11-18 DIAGNOSIS — G4733 Obstructive sleep apnea (adult) (pediatric): Secondary | ICD-10-CM | POA: Insufficient documentation

## 2018-11-18 DIAGNOSIS — I10 Essential (primary) hypertension: Secondary | ICD-10-CM | POA: Insufficient documentation

## 2018-11-18 DIAGNOSIS — Z79899 Other long term (current) drug therapy: Secondary | ICD-10-CM

## 2018-11-18 DIAGNOSIS — C155 Malignant neoplasm of lower third of esophagus: Secondary | ICD-10-CM | POA: Diagnosis not present

## 2018-11-18 DIAGNOSIS — E46 Unspecified protein-calorie malnutrition: Secondary | ICD-10-CM

## 2018-11-18 DIAGNOSIS — Z803 Family history of malignant neoplasm of breast: Secondary | ICD-10-CM | POA: Insufficient documentation

## 2018-11-18 DIAGNOSIS — G893 Neoplasm related pain (acute) (chronic): Secondary | ICD-10-CM | POA: Diagnosis not present

## 2018-11-18 DIAGNOSIS — Z7189 Other specified counseling: Secondary | ICD-10-CM

## 2018-11-18 LAB — CBC WITH DIFFERENTIAL/PLATELET
Abs Immature Granulocytes: 0.04 10*3/uL (ref 0.00–0.07)
Basophils Absolute: 0 10*3/uL (ref 0.0–0.1)
Basophils Relative: 0 %
Eosinophils Absolute: 0.1 10*3/uL (ref 0.0–0.5)
Eosinophils Relative: 2 %
HCT: 23.6 % — ABNORMAL LOW (ref 36.0–46.0)
Hemoglobin: 7.3 g/dL — ABNORMAL LOW (ref 12.0–15.0)
Immature Granulocytes: 1 %
Lymphocytes Relative: 22 %
Lymphs Abs: 0.8 10*3/uL (ref 0.7–4.0)
MCH: 30.5 pg (ref 26.0–34.0)
MCHC: 30.9 g/dL (ref 30.0–36.0)
MCV: 98.7 fL (ref 80.0–100.0)
Monocytes Absolute: 0.2 10*3/uL (ref 0.1–1.0)
Monocytes Relative: 5 %
Neutro Abs: 2.4 10*3/uL (ref 1.7–7.7)
Neutrophils Relative %: 70 %
Platelets: 210 10*3/uL (ref 150–400)
RBC: 2.39 MIL/uL — ABNORMAL LOW (ref 3.87–5.11)
RDW: 23 % — ABNORMAL HIGH (ref 11.5–15.5)
Smear Review: NORMAL
WBC: 3.4 10*3/uL — ABNORMAL LOW (ref 4.0–10.5)
nRBC: 0.6 % — ABNORMAL HIGH (ref 0.0–0.2)

## 2018-11-18 LAB — COMPREHENSIVE METABOLIC PANEL
ALT: 21 U/L (ref 0–44)
AST: 66 U/L — ABNORMAL HIGH (ref 15–41)
Albumin: 3.2 g/dL — ABNORMAL LOW (ref 3.5–5.0)
Alkaline Phosphatase: 123 U/L (ref 38–126)
Anion gap: 12 (ref 5–15)
BUN: 12 mg/dL (ref 8–23)
CO2: 26 mmol/L (ref 22–32)
Calcium: 8.8 mg/dL — ABNORMAL LOW (ref 8.9–10.3)
Chloride: 100 mmol/L (ref 98–111)
Creatinine, Ser: 0.76 mg/dL (ref 0.44–1.00)
GFR calc Af Amer: >60 mL/min (ref >60)
GFR calc non Af Amer: >60 mL/min (ref >60)
Glucose, Bld: 129 mg/dL — ABNORMAL HIGH (ref 70–99)
Potassium: 3.6 mmol/L (ref 3.5–5.1)
Sodium: 138 mmol/L (ref 135–145)
Total Bilirubin: 1.2 mg/dL (ref 0.3–1.2)
Total Protein: 6.9 g/dL (ref 6.5–8.1)

## 2018-11-18 LAB — ABO/RH: ABO/RH(D): B POS

## 2018-11-18 LAB — PREPARE RBC (CROSSMATCH)

## 2018-11-18 MED ORDER — FUROSEMIDE 10 MG/ML IJ SOLN
20.0000 mg | Freq: Once | INTRAMUSCULAR | Status: AC
  Administered 2018-11-18: 16:00:00 20 mg via INTRAVENOUS
  Filled 2018-11-18: qty 2

## 2018-11-18 MED ORDER — HEPARIN SOD (PORK) LOCK FLUSH 100 UNIT/ML IV SOLN
500.0000 [IU] | Freq: Every day | INTRAVENOUS | Status: AC | PRN
  Administered 2018-11-18: 16:00:00 500 [IU]
  Filled 2018-11-18: qty 5

## 2018-11-18 MED ORDER — DIPHENHYDRAMINE HCL 25 MG PO CAPS
25.0000 mg | ORAL_CAPSULE | Freq: Once | ORAL | Status: AC
  Administered 2018-11-18: 13:00:00 25 mg via ORAL
  Filled 2018-11-18: qty 1

## 2018-11-18 MED ORDER — SODIUM CHLORIDE 0.9% FLUSH
10.0000 mL | Freq: Once | INTRAVENOUS | Status: AC
  Administered 2018-11-18: 10:00:00 10 mL via INTRAVENOUS
  Filled 2018-11-18: qty 10

## 2018-11-18 MED ORDER — SODIUM CHLORIDE 0.9% IV SOLUTION
250.0000 mL | Freq: Once | INTRAVENOUS | Status: AC
  Administered 2018-11-18: 13:00:00 250 mL via INTRAVENOUS
  Filled 2018-11-18: qty 250

## 2018-11-18 MED ORDER — SODIUM CHLORIDE 0.9% FLUSH
10.0000 mL | INTRAVENOUS | Status: DC | PRN
  Filled 2018-11-18: qty 10

## 2018-11-18 MED ORDER — ACETAMINOPHEN 325 MG PO TABS
650.0000 mg | ORAL_TABLET | Freq: Once | ORAL | Status: AC
  Administered 2018-11-18: 650 mg via ORAL
  Filled 2018-11-18: qty 2

## 2018-11-18 NOTE — Progress Notes (Signed)
Patient here today for follow up.   

## 2018-11-19 ENCOUNTER — Telehealth: Payer: Self-pay | Admitting: Student

## 2018-11-19 LAB — TYPE AND SCREEN
ABO/RH(D): B POS
Antibody Screen: NEGATIVE
Unit division: 0

## 2018-11-19 LAB — SAMPLE TO BLOOD BANK

## 2018-11-19 LAB — BPAM RBC
Blood Product Expiration Date: 202007222359
ISSUE DATE / TIME: 202007081330
Unit Type and Rh: 7300

## 2018-11-19 MED ORDER — DEXAMETHASONE 1 MG PO TABS: 2.0000 mg | ORAL_TABLET | Freq: Every day | ORAL | 0 refills | Status: DC

## 2018-11-19 NOTE — Telephone Encounter (Signed)
Palliative NP called patient to schedule f/u visit. Patient asked for NP to speak with her sister [name]. Patient had not taken her morning medications. She is having some constipation; she is instructed to take scheduled senna-s now and then she can take an extra dose this evening to promote bowel movement. Patient will be going to the beach for a week. NP will follow up with patient to schedule visit when she returns.

## 2018-11-19 NOTE — Progress Notes (Signed)
Hematology/Oncology Follow Up Note [facility]  [phone] [phone]  Patient Care Team: Tonia Ghent, MD as PCP - General (Family Medicine)   Name of the patient: [name]  [id number]  12/27/1934   REASON FOR VISIT  follow-up for chemotherapy management of esophageal adenocarcinoma,   HISTORY OF PRESENT ILLNESS Patient is a 65-old female who has metastatic esophageal cancer with liver involvement.  Weight loss 20 pounds for the past year prior to diagnosis.  # EGD during admission showed partially obstructive esophageal mass at the distal part of esophagus. Biopsy was taken. Pathology showed esophageal adenocarcinoma. #US biopsy of liver lesion to proof distant metastasis.  Report family history of breast cancer, but she has been having routine mammograms.  # Lives alone. She's a former smoker.  # Molecular Testing:  #[employer] Cdx: No reportable alternations with companion diagnostic (CDx) claims.  MS stable Tumor mutation burden: Cannot be determined, CCND3 amplification: Equivocal.  Amended reports on 06/04/2017, Tumor mutational burden changed from "can not be determined" to "TMB -low" on the re-analyzed samples.  Tumor Proportion Score [TPS] 20%. # HER 2 negative.   Antineoplasm Treatments S/p 6 cycles of FOLFOX, PET scan showed excellent partial response from both chemotherapy and radiation, result was discussed with patient.  Patient was switched to 5-FU maintenance and to September 2019. S/p palliative radiation in January 2019 # April 2019 Developed esophageal stricture secondary to radiation in April and got dilation via endoscopy. EGD shoed Benign-appearing esophageal stenosis. This is a result of radiation therapy.- LA Grade C radiation esophagitis. - The previously noted esophageal mass in Nov 2018 was much decreased indicating good response to radiation/chemotherapy. - Normal stomach.- Normal examined jejunum.-  No specimens collected.   #01/23/2018 PET scan showed interval development of loculated ascites within the upper abdomen with overlies the liver and the spleen and exhibited increased radio tracer uptake.  Worrisome for peritoneal carcinomatosis.  Soft tissue infiltration within the omentum with increased radiotracer uptake noted.  Worrisome for peritoneal carcinomatosis.  New hypermetabolic internal mammary lymph nodes and a right CP angle lymph node.  Suspicious for metastatic adenopathy.  Mild FDG uptake is identified localizing to the lower third of the esophagus raising SUV max of 5.15.  # 01/26/2018 2nd line treatment Taxol and Cyramza  12/31/20219  CT chest abdomen pelvis unfortunately showed mixed response. # 05/27/2018 3nd line treatment with pembrolizumab.  07/21/2018 MRI of brain showed a subacute stroke. Patient was advised to restart on aspirin 81 mg.  And to follow-up with primary care physician. Patient has been seen by PCP and had a discussion about subacute stroke.  Patient stopped using aspirin because of increased bruising.  Decision was made to take aspirin 81 mg every other day.  Statin was not started given patient's metastatic esophageal cancer setting and a limited life span.  Metoprolol was discontinued due to low blood pressure.  # 08/18/2018 CT chest abdomen pelvis showed progression.  08/31/2018 started on oral chemotherapy Lonsurf 35 mg/m BID, day 1-5, and day 8-12 every 28 cycles.  INTERVAL HISTORY  84 y.o. female with history of metastatic esophageal cancer present for evaluation while on oral chemotherapy LONGSURF for the treatment of esophageal cancer. Today's day 17 of cycle 3 Reports feeling worsening of fatigue and tiredness. Reports abdominal pain, she uses tramadol 50 mg daily as needed which helps relieving her pain.  Appetite remains poor.  She continues to lose weight. She takes Bactrim half tablets for UTI prophylaxis.  She has chronic urinary urgency.  No  burning sensation. No recent confusion episodes  Weight loss 4 pounds since last visit.   Review of Systems  Constitutional: Positive for appetite change, fatigue and unexpected weight change. Negative for chills and fever.  HENT:   Negative for hearing loss and voice change.   Eyes: Negative for eye problems.  Respiratory: Negative for chest tightness and cough.   Cardiovascular: Positive for leg swelling. Negative for chest pain.  Gastrointestinal: Negative for abdominal distention, abdominal pain, blood in stool and nausea.  Endocrine: Negative for hot flashes.  Genitourinary: Positive for frequency. Negative for difficulty urinating and dysuria.   Musculoskeletal: Negative for arthralgias and flank pain.  Skin: Negative for itching and rash.  Neurological: Positive for numbness. Negative for extremity weakness.  Hematological: Negative for adenopathy.  Psychiatric/Behavioral: Negative for confusion.     Allergies  Allergen Reactions  . Cefuroxime Nausea Only  . Codeine Nausea Only  . Doxycycline Other (See Comments)    Lip swelling  . Gabapentin Other (See Comments)    Swelling.   . Iohexol Itching    07-15-13 pt developed itching on fingers after contrast given. Dr. Irish Elders looked at pt and said to put in system as allergy. BB  . Other Other (See Comments)    Contrast Dye- caused fever, chills, awful feeling Bandages - itching, rash  . Oxycodone Other (See Comments)    nausea  . Quinolones Swelling    Lip swelling  . Synvisc [Hylan G-F 20] Swelling     Past Medical History:  Diagnosis Date  . Arthritis   . Asthma   . COPD (chronic obstructive pulmonary disease) ([place])   . Dysphonia   . Dyspnea   . Esophageal cancer ([place])   . GERD (gastroesophageal reflux disease)   . Heart murmur   . History of kidney stones   . Hypertension   . Hypokalemia 10/07/2017  . Melanoma ([place])   . OAB (overactive bladder)   . OSA on CPAP   . Paresthesia    from neck down after  spinal abscess  . Personal history of chemotherapy    esophageal cancer  . Personal history of radiation therapy    esophageal cancer  . Pupil asymmetry    R pupil defect  . Skin cancer   . Sleep apnea   . Spinal cord abscess      Past Surgical History:  Procedure Laterality Date  . ABDOMINAL HYSTERECTOMY    . ANTERIOR CERVICAL DECOMP/DISCECTOMY FUSION N/A 07/16/2013   Procedure: ANTERIOR CERVICAL DECOMPRESSION/DISCECTOMY FUSION mutlipleLEVELS C4-7;  Surgeon: Erline Levine, MD;  Location: [facility];  Service: Neurosurgery;  Laterality: N/A;  . APPENDECTOMY    . carpel tunn Bilateral   . CATARACT EXTRACTION    . CATARACT EXTRACTION, BILATERAL    . CHOLECYSTECTOMY    . dental implant    . ESOPHAGOGASTRODUODENOSCOPY Left 03/21/2017   Procedure: ESOPHAGOGASTRODUODENOSCOPY (EGD);  Surgeon: Virgel Manifold, MD;  Location: [facility] END[facility]COPY;  Service: Endoscopy;  Laterality: Left;  . ESOPHAGOGASTRODUODENOSCOPY (EGD) WITH PROPOFOL N/A 08/20/2017   Procedure: ESOPHAGOGASTRODUODENOSCOPY (EGD) WITH PROPOFOL;  Surgeon: Virgel Manifold, MD;  Location: [facility] ENDOSCOPY;  Service: Endoscopy;  Laterality: N/A;  . ESOPHAGOGASTRODUODENOSCOPY (EGD) WITH PROPOFOL N/A 09/19/2017   Procedure: ESOPHAGOGASTRODUODENOSCOPY (EGD) WITH PROPOFOL;  Surgeon: Virgel Manifold, MD;  Location: [facility] ENDOSCOPY;  Service: Endoscopy;  Laterality: N/A;  . ESOPHAGOGASTRODUODENOSCOPY (EGD) WITH PROPOFOL N/A 10/03/2017   Procedure: ESOPHAGOGASTRODUODENOSCOPY (EGD) WITH PROPOFOL;  Surgeon: [name]  B, MD;  Location: [facility] ENDOSCOPY;  Service: Endoscopy;  Laterality: N/A;  . ESOPHAGOGASTRODUODENOSCOPY (EGD) WITH PROPOFOL N/A 10/22/2017   Procedure: ESOPHAGOGASTRODUODENOSCOPY (EGD) WITH PROPOFOL;  Surgeon: Virgel Manifold, MD;  Location: [facility] ENDOSCOPY;  Service: Endoscopy;  Laterality: N/A;  . ESOPHAGOGASTRODUODENOSCOPY (EGD) WITH PROPOFOL N/A 11/18/2017   Procedure: ESOPHAGOGASTRODUODENOSCOPY (EGD) WITH  PROPOFOL;  Surgeon: Virgel Manifold, MD;  Location: [facility] ENDOSCOPY;  Service: Endoscopy;  Laterality: N/A;  . ESOPHAGOGASTRODUODENOSCOPY (EGD) WITH PROPOFOL N/A 12/17/2017   Procedure: ESOPHAGOGASTRODUODENOSCOPY (EGD) WITH PROPOFOL;  Surgeon: Virgel Manifold, MD;  Location: [facility] ENDOSCOPY;  Service: Endoscopy;  Laterality: N/A;  . ESOPHAGOGASTRODUODENOSCOPY (EGD) WITH PROPOFOL N/A 01/08/2018   Procedure: ESOPHAGOGASTRODUODENOSCOPY (EGD) WITH PROPOFOL;  Surgeon: Virgel Manifold, MD;  Location: [facility] ENDOSCOPY;  Service: Endoscopy;  Laterality: N/A;  . ESOPHAGOGASTRODUODENOSCOPY (EGD) WITH PROPOFOL N/A 01/22/2018   Procedure: ESOPHAGOGASTRODUODENOSCOPY (EGD) WITH PROPOFOL;  Surgeon: Virgel Manifold, MD;  Location: [facility] ENDOSCOPY;  Service: Endoscopy;  Laterality: N/A;  . ESOPHAGOGASTRODUODENOSCOPY (EGD) WITH PROPOFOL N/A 07/15/2018   Procedure: ESOPHAGOGASTRODUODENOSCOPY (EGD) WITH PROPOFOL;  Surgeon: Virgel Manifold, MD;  Location: [facility] ENDOSCOPY;  Service: Endoscopy;  Laterality: N/A;  . EYE SURGERY    . GASTRIC BYPASS    . HERNIA REPAIR    . HIATAL HERNIA REPAIR    . JOINT REPLACEMENT Bilateral    knee  . MELANOMA EXCISION    . PORTA CATH INSERTION N/A 04/07/2017   Procedure: PORTA CATH INSERTION;  Surgeon: Algernon Huxley, MD;  Location: [facility] CV LAB;  Service: Cardiovascular;  Laterality: N/A;  . POSTERIOR CERVICAL FUSION/FORAMINOTOMY N/A 07/28/2013   Procedure: Cervical four-seven  posterior cervical fusion;  Surgeon: Erline Levine, MD;  Location: [place] NEUR[facility] ORS;  Service: Neurosurgery;  Laterality: N/A;  . TONSILLECTOMY    . TOTAL KNEE ARTHROPLASTY      Social History   Socioeconomic History  . Marital status: Widowed    Spouse name: Not on file  . Number of children: 2  . Years of education: Not on file  . Highest education level: Master's degree (e.g., MA, MS, MEng, MEd, MSW, MBA)  Occupational History  . Not on file  Social Needs  . Financial resource  strain: Not hard at all  . Food insecurity    Worry: Never true    Inability: Never true  . Transportation needs    Medical: No    Non-medical: No  Tobacco Use  . Smoking status: Former Smoker    Packs/day: 1.00    Years: 20.00    Pack years: 20.00    Types: Cigarettes    Quit date: 1977    Years since quitting: 43.5  . Smokeless tobacco: Never Used  Substance and Sexual Activity  . Alcohol use: No  . Drug use: Never  . Sexual activity: Not Currently  Lifestyle  . Physical activity    Days per week: 2 days    Minutes per session: 30 min  . Stress: Not at all  Relationships  . Social connections    Talks on phone: More than three times a week    Gets together: More than three times a week    Attends religious service: More than 4 times per year    Active member of club or organization: Yes    Attends meetings of clubs or organizations: More than 4 times per year    Relationship status: Widowed  . Intimate partner violence    Fear of current or ex partner: No  Emotionally abused: No    Physically abused: No    Forced sexual activity: No  Other Topics Concern  . Not on file  Social History Narrative   Marital Status- Widowed    Lives by herself    Employement- Retired Pharmacist, hospital   Exercise hx- Does PT     Family History  Problem Relation Age of Onset  . Breast cancer Mother 58  . Arthritis-Osteo Mother   . Breast cancer Sister 91  . Bladder Cancer Sister   . Bladder Cancer Father   . Tongue cancer Father   . Congenital heart disease Father   . Prostate cancer Brother   . Uterine cancer Maternal Aunt   . Lung cancer Paternal Uncle   . Leukemia Maternal Grandmother   . Liver cancer Paternal Grandmother   . Colon cancer Neg Hx      Current Outpatient Medications:  .  albuterol (PROVENTIL HFA;VENTOLIN HFA) 108 (90 Base) MCG/ACT inhaler, Inhale 2 puffs into the lungs every 4 (four) hours as needed for wheezing or shortness of breath., Disp: , Rfl:  .  aspirin  EC 81 MG tablet, Take 1 tablet (81 mg total) by mouth every other day., Disp: , Rfl:  .  calcium carbonate (TUMS EX) 750 MG chewable tablet, Chew 1 tablet by mouth as needed for heartburn. , Disp: , Rfl:  .  chlorhexidine (PERIDEX) 0.12 % solution, Use as directed 15 mLs in the mouth or throat 2 (two) times daily., Disp: , Rfl:  .  Cholecalciferol (VITAMIN D) 2000 UNITS CAPS, Take 2,000 Units by mouth daily., Disp: , Rfl:  .  dexamethasone (DECADRON) 4 MG tablet, Take 0.5 tablets (2 mg total) by mouth daily., Disp: 30 tablet, Rfl: 0 .  Diclofenac Sodium 1 % CREA, Apply to left arm as needed for pain three times daily, Disp: 120 g, Rfl: 0 .  dronabinol (MARINOL) 5 MG capsule, Take 1 capsule (5 mg total) by mouth 4 (four) times daily., Disp: 120 capsule, Rfl: 0 .  fluticasone (FLONASE) 50 MCG/ACT nasal spray, Place 1-2 sprays into both nostrils daily., Disp: 16 g, Rfl: 5 .  furosemide (LASIX) 20 MG tablet, TAKE 1-2 TABLETS BY MOUTH DAILY AS NEEDED FOR EDEMA, Disp: 180 tablet, Rfl: 1 .  hydrocortisone cream 1 %, Apply 1 application topically daily as needed for itching. , Disp: , Rfl:  .  loratadine (CLARITIN) 10 MG tablet, Take 10 mg by mouth daily., Disp: , Rfl:  .  Multiple Vitamins-Minerals (MULTIVITAMIN ADULT) CHEW, Chew 1 tablet by mouth daily. , Disp: , Rfl:  .  MYRBETRIQ 25 MG TB24 tablet, TAKE ONE TABLET EVERY DAY, Disp: 30 tablet, Rfl: 5 .  oxybutynin (DITROPAN) 5 MG tablet, TAKE ONE TABLET BY MOUTH EVERY MORNING AND TAKE ONE-HALF TABLET AT BEDTIME, Disp: 45 tablet, Rfl: 3 .  pantoprazole (PROTONIX) 40 MG tablet, TAKE 1 TABLET BY MOUTH TWICE DAILY BEFORE A MEAL, Disp: 60 tablet, Rfl: 3 .  Polyethyl Glycol-Propyl Glycol (SYSTANE OP), Apply 1 drop to eye daily as needed (dry eyes)., Disp: , Rfl:  .  potassium chloride (KLOR-CON) 20 MEQ packet, Take 20 mEq by mouth daily., Disp: 30 packet, Rfl: 0 .  pregabalin (LYRICA) 150 MG capsule, TAKE 1 CAPSULE TWICE DAILY, Disp: 60 capsule, Rfl: 3 .   prochlorperazine (COMPAZINE) 10 MG tablet, TAKE ONE TABLET BY MOUTH EVERY 6 HOURS AS NEEDED FOR NAUSEA OR VOMITING, Disp: 30 tablet, Rfl: 1 .  pyridOXINE (VITAMIN B-6) 100 MG tablet, Take 1  tablet (100 mg total) by mouth daily., Disp: 30 tablet, Rfl: 3 .  SENNA-PLUS 8.6-50 MG tablet, TAKE TWO TABLETS BY MOUTH EVERY DAY, Disp: 60 tablet, Rfl: 0 .  sulfamethoxazole-trimethoprim (BACTRIM) 400-80 MG tablet, Take 0.5 tablets by mouth 2 (two) times daily., Disp: 30 tablet, Rfl: 2 .  traZODone (DESYREL) 50 MG tablet, Take 0.5-1 tablets (25-50 mg total) by mouth at bedtime as needed for sleep., Disp: , Rfl:  .  trifluridine-tipiracil (LONSURF) 15-6.14 MG tablet, Take 2 tablets by mouth two (2) times daily after a meal. Take with two 52m tablets. Take only on days 1-5, 8-12. Repeat every 28days., Disp: 40 tablet, Rfl: 3 .  trifluridine-tipiracil (LONSURF) 20-8.19 MG tablet, Take 2 tablets by mouth two (2) times daily after a meal. Take with two 171mtablets. Take only on days 1-5, 8-12. Repeat every 28days., Disp: 40 tablet, Rfl: 3 No current facility-administered medications for this visit.   Facility-Administered Medications Ordered in Other Visits:  .  sodium chloride flush (NS) 0.9 % injection 10 mL, 10 mL, Intravenous, PRN, YuEarlie ServerMD, 10 mL at 01/26/18 0904  Physical exam:   Today's Vitals   11/18/18 1103 11/18/18 1104  BP: 119/70   Pulse: 72   Temp: (!) 97.1 F (36.2 C)   TempSrc: Tympanic   Weight: 175 lb 2 oz (79.4 kg)   PainSc:  0-No pain   Body mass index is 32.03 kg/m. ECOG 2 Physical Exam  Constitutional: She is oriented to person, place, and time. No distress.  Frail.walk in.   HENT:  Head: Normocephalic and atraumatic.  Nose: Nose normal.  Mouth/Throat: Oropharynx is clear and moist. No oropharyngeal exudate.  Eyes: Pupils are equal, round, and reactive to light. Conjunctivae and EOM are normal. No scleral icterus.  Neck: Normal range of motion. Neck supple. No JVD  present.  Cardiovascular: Normal rate and regular rhythm.  Murmur heard. Pulmonary/Chest: Effort normal and breath sounds normal. No respiratory distress.  Abdominal: Soft. Bowel sounds are normal. She exhibits no distension. There is no abdominal tenderness.  Musculoskeletal: Normal range of motion.        General: Edema present. No tenderness or deformity.  Lymphadenopathy:    She has no cervical adenopathy.  Neurological: She is alert and oriented to person, place, and time.  Skin: Skin is warm and dry. She is not diaphoretic. No erythema.  Psychiatric: Affect and judgment normal.    CMP Latest Ref Rng & Units 11/18/2018  Glucose 70 - 99 mg/dL 129(H)  BUN 8 - 23 mg/dL 12  Creatinine 0.44 - 1.00 mg/dL 0.76  Sodium 135 - 145 mmol/L 138  Potassium 3.5 - 5.1 mmol/L 3.6  Chloride 98 - 111 mmol/L 100  CO2 22 - 32 mmol/L 26  Calcium 8.9 - 10.3 mg/dL 8.8(L)  Total Protein 6.5 - 8.1 g/dL 6.9  Total Bilirubin 0.3 - 1.2 mg/dL 1.2  Alkaline Phos 38 - 126 U/L 123  AST 15 - 41 U/L 66(H)  ALT 0 - 44 U/L 21   CBC Latest Ref Rng & Units 11/18/2018  WBC 4.0 - 10.5 K/uL 3.4(L)  Hemoglobin 12.0 - 15.0 g/dL 7.3(L)  Hematocrit 36.0 - 46.0 % 23.6(L)  Platelets 150 - 400 K/uL 210   RADIOGRAPHIC STUDIES: I have personally reviewed the radiological images as listed and agreed with the findings in the report.  PET scan 01/23/2018  Interval development of loculated ascites within the upper abdomen which overlies the liver and spleen and exhibits increased radiotracer  uptake. Findings worrisome for peritoneal carcinomatosis. Consider further evaluation with diagnostic paracentesis. 2. Soft tissue infiltration within the omentum with increased radiotracer uptake noted. Also worrisome for peritoneal carcinomatosis. 3. New hypermetabolic internal mammary lymph nodes and right CP angle lymph node. Suspicious for metastatic adenopathy. 4. Mild FDG uptake is identified localizing to the lower third of the  esophagus within SUV max of 5.15.  Bone Scan 05/08/2018  1. Several small foci of activity in anterior ribs as noted above, and metastatic involvement can not be excluded. 2. Somewhat mottled activity within the midthoracic and upper lumbar spine and metastatic involvement can not be excluded. Correlate with plain films or MRI preferably.  08/18/2018 CT chest abdomen pelvis with contrast 1. New and enlarging tumors throughout the liver compatible with progressive malignancy. 2. New 2.0 by 3.1 cm soft tissue density between the stomach in the liver, probably a malignant deposit/lymph node. 3. Stable mild circumferential wall thickening in the distal esophagus. Prior gastric bypass. 4. Other imaging findings of potential clinical significance: Aortic Atherosclerosis (ICD10-I70.0). Coronary atherosclerosis. Mild cardiomegaly. Aortic and mitral valve calcifications. Mild increase in right middle lobe atelectasis without obvious tumor in the right middle lobe. Scarring or atelectasis in the lung bases. New trace left pleural effusion. Thoracolumbar spondylosis and degenerative disc disease. Non-specific 3 mm hypodense lesion in the spleen. Stable left kidney lower pole staghorn calculus, nonobstructive. Cutaneous thickening in the right lower quadrant anterior abdominal wall, possibly from low-grade panniculitis.  Assessment and plan  Cancer Staging Malignant neoplasm of lower third of esophagus [facility] Staging form: Esophagus - Adenocarcinoma, AJCC 8th Edition - Clinical stage from 04/10/2017: Stage IVB (cTX, cNX, pM1) - Signed by Earlie Server, MD on 04/10/2017  1. Esophageal adenocarcinoma ([place])   2. Encounter for antineoplastic chemotherapy   3. Weight loss   4. Protein-calorie malnutrition, unspecified severity ([place])   5. Goals of care, counseling/discussion   6. Symptomatic anemia    #Esophageal cancer, metastatic,CEA is not a marker anymore. On 4th line treatment.  Today is day 17 of cycle 3.  Labs are reviewed and discussed with patient. Plan obtain CT scan after 4 cycles of Lonsurf.  #Symptomatic anemia, hemoglobin was 7.3.  Recommend to proceed with 1 unit of PRBC transfusion.  #Weight loss, protein calorie malnutrition.  Continue dexamethasone 2 mg daily.  Patient is not sure whether she is taking 2 mg or 4 mg a day.  She does not recall if she is cutting her pill to half tablet. Switch to prescription to 1 mg tablet.  Discussed with patient about taking two 1 mg tablets daily. Continue Marinol 5 mg 4 times daily.  #Recurrent UTI continue half tablet Bactrim twice daily prophylactically. Marland Kitchen#Depression, continue trazodone 50 mg every evening. #Port-A-Cath in place, flush every 6 weeks  Patient is going for a family reunion party and be out of town next week. Continue follow up with palliative care.   Follow-up in 2 weeks. for re-evaluation.     Earlie Server, MD, PhD

## 2018-11-23 ENCOUNTER — Ambulatory Visit: Payer: Medicare Other | Admitting: Oncology

## 2018-11-23 ENCOUNTER — Encounter: Payer: Medicare Other | Admitting: Hospice and Palliative Medicine

## 2018-11-23 ENCOUNTER — Other Ambulatory Visit: Payer: Medicare Other

## 2018-11-23 ENCOUNTER — Inpatient Hospital Stay: Payer: Medicare Other

## 2018-11-30 ENCOUNTER — Inpatient Hospital Stay: Payer: Medicare Other

## 2018-11-30 ENCOUNTER — Inpatient Hospital Stay: Payer: Medicare Other | Admitting: Oncology

## 2018-11-30 ENCOUNTER — Inpatient Hospital Stay: Payer: Medicare Other | Admitting: Hospice and Palliative Medicine

## 2018-11-30 NOTE — Telephone Encounter (Signed)
[name] from [facility] is calling back regarding her prev. Message from 11/10/18 to find out if pt needs  Endoscopy ultrasound

## 2018-12-01 ENCOUNTER — Inpatient Hospital Stay: Payer: Medicare Other

## 2018-12-01 ENCOUNTER — Other Ambulatory Visit: Payer: Self-pay

## 2018-12-01 ENCOUNTER — Inpatient Hospital Stay (HOSPITAL_BASED_OUTPATIENT_CLINIC_OR_DEPARTMENT_OTHER): Payer: Medicare Other | Admitting: Hospice and Palliative Medicine

## 2018-12-01 ENCOUNTER — Inpatient Hospital Stay (HOSPITAL_BASED_OUTPATIENT_CLINIC_OR_DEPARTMENT_OTHER): Payer: Medicare Other | Admitting: Oncology

## 2018-12-01 ENCOUNTER — Encounter: Payer: Self-pay | Admitting: Oncology

## 2018-12-01 VITALS — BP 123/84 | HR 80 | Temp 98.8°F | Wt 172.3 lb

## 2018-12-01 DIAGNOSIS — I10 Essential (primary) hypertension: Secondary | ICD-10-CM

## 2018-12-01 DIAGNOSIS — Z87891 Personal history of nicotine dependence: Secondary | ICD-10-CM

## 2018-12-01 DIAGNOSIS — Z85820 Personal history of malignant melanoma of skin: Secondary | ICD-10-CM

## 2018-12-01 DIAGNOSIS — R634 Abnormal weight loss: Secondary | ICD-10-CM

## 2018-12-01 DIAGNOSIS — N390 Urinary tract infection, site not specified: Secondary | ICD-10-CM

## 2018-12-01 DIAGNOSIS — E43 Unspecified severe protein-calorie malnutrition: Secondary | ICD-10-CM

## 2018-12-01 DIAGNOSIS — E46 Unspecified protein-calorie malnutrition: Secondary | ICD-10-CM

## 2018-12-01 DIAGNOSIS — Z5111 Encounter for antineoplastic chemotherapy: Secondary | ICD-10-CM

## 2018-12-01 DIAGNOSIS — C158 Malignant neoplasm of overlapping sites of esophagus: Secondary | ICD-10-CM

## 2018-12-01 DIAGNOSIS — Z515 Encounter for palliative care: Secondary | ICD-10-CM

## 2018-12-01 DIAGNOSIS — F329 Major depressive disorder, single episode, unspecified: Secondary | ICD-10-CM

## 2018-12-01 DIAGNOSIS — C159 Malignant neoplasm of esophagus, unspecified: Secondary | ICD-10-CM

## 2018-12-01 DIAGNOSIS — D6481 Anemia due to antineoplastic chemotherapy: Secondary | ICD-10-CM

## 2018-12-01 DIAGNOSIS — C787 Secondary malignant neoplasm of liver and intrahepatic bile duct: Secondary | ICD-10-CM | POA: Diagnosis not present

## 2018-12-01 DIAGNOSIS — G4733 Obstructive sleep apnea (adult) (pediatric): Secondary | ICD-10-CM

## 2018-12-01 DIAGNOSIS — F32A Depression, unspecified: Secondary | ICD-10-CM

## 2018-12-01 DIAGNOSIS — Z7189 Other specified counseling: Secondary | ICD-10-CM

## 2018-12-01 DIAGNOSIS — Z9884 Bariatric surgery status: Secondary | ICD-10-CM

## 2018-12-01 DIAGNOSIS — T451X5A Adverse effect of antineoplastic and immunosuppressive drugs, initial encounter: Secondary | ICD-10-CM

## 2018-12-01 DIAGNOSIS — J449 Chronic obstructive pulmonary disease, unspecified: Secondary | ICD-10-CM

## 2018-12-01 DIAGNOSIS — D649 Anemia, unspecified: Secondary | ICD-10-CM

## 2018-12-01 DIAGNOSIS — Z7982 Long term (current) use of aspirin: Secondary | ICD-10-CM

## 2018-12-01 DIAGNOSIS — Z95828 Presence of other vascular implants and grafts: Secondary | ICD-10-CM

## 2018-12-01 DIAGNOSIS — Z79899 Other long term (current) drug therapy: Secondary | ICD-10-CM

## 2018-12-01 DIAGNOSIS — Z803 Family history of malignant neoplasm of breast: Secondary | ICD-10-CM

## 2018-12-01 LAB — CBC WITH DIFFERENTIAL/PLATELET
Abs Immature Granulocytes: 0.06 10*3/uL (ref 0.00–0.07)
Basophils Absolute: 0 10*3/uL (ref 0.0–0.1)
Basophils Relative: 0 %
Eosinophils Absolute: 0 10*3/uL (ref 0.0–0.5)
Eosinophils Relative: 0 %
HCT: 32.7 % — ABNORMAL LOW (ref 36.0–46.0)
Hemoglobin: 9.9 g/dL — ABNORMAL LOW (ref 12.0–15.0)
Immature Granulocytes: 2 %
Lymphocytes Relative: 8 %
Lymphs Abs: 0.3 10*3/uL — ABNORMAL LOW (ref 0.7–4.0)
MCH: 31.3 pg (ref 26.0–34.0)
MCHC: 30.3 g/dL (ref 30.0–36.0)
MCV: 103.5 fL — ABNORMAL HIGH (ref 80.0–100.0)
Monocytes Absolute: 0.5 10*3/uL (ref 0.1–1.0)
Monocytes Relative: 16 %
Neutro Abs: 2.4 10*3/uL (ref 1.7–7.7)
Neutrophils Relative %: 74 %
Platelets: 173 10*3/uL (ref 150–400)
RBC: 3.16 MIL/uL — ABNORMAL LOW (ref 3.87–5.11)
RDW: 22.5 % — ABNORMAL HIGH (ref 11.5–15.5)
WBC: 3.2 10*3/uL — ABNORMAL LOW (ref 4.0–10.5)
nRBC: 0 % (ref 0.0–0.2)

## 2018-12-01 LAB — COMPREHENSIVE METABOLIC PANEL
ALT: 28 U/L (ref 0–44)
AST: 78 U/L — ABNORMAL HIGH (ref 15–41)
Albumin: 3.4 g/dL — ABNORMAL LOW (ref 3.5–5.0)
Alkaline Phosphatase: 128 U/L — ABNORMAL HIGH (ref 38–126)
Anion gap: 11 (ref 5–15)
BUN: 19 mg/dL (ref 8–23)
CO2: 27 mmol/L (ref 22–32)
Calcium: 8.9 mg/dL (ref 8.9–10.3)
Chloride: 99 mmol/L (ref 98–111)
Creatinine, Ser: 1.07 mg/dL — ABNORMAL HIGH (ref 0.44–1.00)
GFR calc Af Amer: 56 mL/min — ABNORMAL LOW (ref >60)
GFR calc non Af Amer: 48 mL/min — ABNORMAL LOW (ref >60)
Glucose, Bld: 144 mg/dL — ABNORMAL HIGH (ref 70–99)
Potassium: 4.2 mmol/L (ref 3.5–5.1)
Sodium: 137 mmol/L (ref 135–145)
Total Bilirubin: 0.9 mg/dL (ref 0.3–1.2)
Total Protein: 7.1 g/dL (ref 6.5–8.1)

## 2018-12-01 LAB — PREALBUMIN: Prealbumin: 11.4 mg/dL — ABNORMAL LOW (ref 18–38)

## 2018-12-01 LAB — SAMPLE TO BLOOD BANK

## 2018-12-01 MED ORDER — HEPARIN SOD (PORK) LOCK FLUSH 100 UNIT/ML IV SOLN: 500.0000 [IU] | Freq: Once | INTRAVENOUS | Status: AC

## 2018-12-01 MED ORDER — SODIUM CHLORIDE 0.9% FLUSH
10.0000 mL | Freq: Once | INTRAVENOUS | Status: AC
  Filled 2018-12-01: qty 10

## 2018-12-01 NOTE — Progress Notes (Signed)
Hematology/Oncology Follow Up Note [facility]  Telephone:([phone] [phone]  Patient Care Team: Tonia Ghent, MD as PCP - General (Family Medicine)   Name of the patient: [name]  [id number]  1934-11-15   REASON FOR VISIT  follow-up for chemotherapy management of esophageal adenocarcinoma,   HISTORY OF PRESENT ILLNESS Patient is a 87-old female who has metastatic esophageal cancer with liver involvement.  Weight loss 20 pounds for the past year prior to diagnosis.  # EGD during admission showed partially obstructive esophageal mass at the distal part of esophagus. Biopsy was taken. Pathology showed esophageal adenocarcinoma. #US biopsy of liver lesion to proof distant metastasis.  Report family history of breast cancer, but she has been having routine mammograms.  # Lives alone. She's a former smoker.  # Molecular Testing:  #[employer] Cdx: No reportable alternations with companion diagnostic (CDx) claims.  MS stable Tumor mutation burden: Cannot be determined, CCND3 amplification: Equivocal.  Amended reports on 06/04/2017, Tumor mutational burden changed from "can not be determined" to "TMB -low" on the re-analyzed samples.  Tumor Proportion Score [TPS] 20%. # HER 2 negative.   Antineoplasm Treatments S/p 6 cycles of FOLFOX, PET scan showed excellent partial response from both chemotherapy and radiation, result was discussed with patient.  Patient was switched to 5-FU maintenance and to September 2019. S/p palliative radiation in January 2019 # April 2019 Developed esophageal stricture secondary to radiation in April and got dilation via endoscopy. EGD shoed Benign-appearing esophageal stenosis. This is a result of radiation therapy.- LA Grade C radiation esophagitis. - The previously noted esophageal mass in Nov 2018 was much decreased indicating good response to radiation/chemotherapy. - Normal stomach.- Normal examined jejunum.-  No specimens collected.   #01/23/2018 PET scan showed interval development of loculated ascites within the upper abdomen with overlies the liver and the spleen and exhibited increased radio tracer uptake.  Worrisome for peritoneal carcinomatosis.  Soft tissue infiltration within the omentum with increased radiotracer uptake noted.  Worrisome for peritoneal carcinomatosis.  New hypermetabolic internal mammary lymph nodes and a right CP angle lymph node.  Suspicious for metastatic adenopathy.  Mild FDG uptake is identified localizing to the lower third of the esophagus raising SUV max of 5.15.  # 01/26/2018 2nd line treatment Taxol and Cyramza  12/31/20219  CT chest abdomen pelvis unfortunately showed mixed response. # 05/27/2018 3nd line treatment with pembrolizumab.  07/21/2018 MRI of brain showed a subacute stroke. Patient was advised to restart on aspirin 81 mg.  And to follow-up with primary care physician. Patient has been seen by PCP and had a discussion about subacute stroke.  Patient stopped using aspirin because of increased bruising.  Decision was made to take aspirin 81 mg every other day.  Statin was not started given patient's metastatic esophageal cancer setting and a limited life span.  Metoprolol was discontinued due to low blood pressure.  # 08/18/2018 CT chest abdomen pelvis showed progression.  08/31/2018 started on oral chemotherapy Lonsurf 35 mg/m BID, day 1-5, and day 8-12 every 28 cycles.  INTERVAL HISTORY  84 y.o. female with history of metastatic esophageal cancer present for evaluation while on oral chemotherapy LONGSURF for the treatment of esophageal cancer. Patient was out of town, which for family reunion.  She tells me that she had a good time. Yesterday she experienced abdominal pain for about 6 to 8 hours, 8/10.  Has to take 2 tramadol for pain control. Appetite is fair.  Continue to lose  weight.  Lost about 3 pounds since last visit.   Reports feeling worsening of  fatigue and tiredness. Reports abdominal pain, she uses tramadol 50 mg daily as needed which helps relieving her pain.  Appetite remains poor.  She continues to lose weight. She takes Bactrim half tablets for UTI prophylaxis.  She has chronic urinary urgency.  No burning sensation. No recent confusion episodes  Weight loss 4 pounds since last visit.  Chronic urinary urgency.  On prophylactic. She uses Lasix 20 mg daily for lower extremity swelling.   Review of Systems  Constitutional: Positive for appetite change, fatigue and unexpected weight change. Negative for chills and fever.  HENT:   Negative for hearing loss and voice change.   Eyes: Negative for eye problems.  Respiratory: Negative for chest tightness and cough.   Cardiovascular: Positive for leg swelling. Negative for chest pain.  Gastrointestinal: Negative for abdominal distention, abdominal pain, blood in stool and nausea.  Endocrine: Negative for hot flashes.  Genitourinary: Positive for frequency. Negative for difficulty urinating and dysuria.   Musculoskeletal: Negative for arthralgias and flank pain.  Skin: Negative for itching and rash.  Neurological: Positive for numbness. Negative for extremity weakness.  Hematological: Negative for adenopathy.  Psychiatric/Behavioral: Negative for confusion.     Allergies  Allergen Reactions  . Cefuroxime Nausea Only  . Codeine Nausea Only  . Doxycycline Other (See Comments)    Lip swelling  . Gabapentin Other (See Comments)    Swelling.   . Iohexol Itching    07-15-13 pt developed itching on fingers after contrast given. Dr. Irish Elders looked at pt and said to put in system as allergy. BB  . Other Other (See Comments)    Contrast Dye- caused fever, chills, awful feeling Bandages - itching, rash  . Oxycodone Other (See Comments)    nausea  . Quinolones Swelling    Lip swelling  . Synvisc [Hylan G-F 20] Swelling     Past Medical History:  Diagnosis Date  . Arthritis   .  Asthma   . COPD (chronic obstructive pulmonary disease) ([place])   . Dysphonia   . Dyspnea   . Esophageal cancer ([place])   . GERD (gastroesophageal reflux disease)   . Heart murmur   . History of kidney stones   . Hypertension   . Hypokalemia 10/07/2017  . Melanoma ([place])   . OAB (overactive bladder)   . OSA on CPAP   . 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 Personal history of chemotherapy    esophageal cancer  . Personal history of radiation therapy    esophageal cancer  . Pupil asymmetry    R pupil defect  . Skin cancer   . Sleep apnea   . Spinal cord abscess      Past Surgical History:  Procedure Laterality Date  . ABDOMINAL HYSTERECTOMY    . ANTERIOR CERVICAL DECOMP/DISCECTOMY FUSION N/A 07/16/2013   Procedure: ANTERIOR CERVICAL DECOMPRESSION/DISCECTOMY FUSION mutlipleLEVELS C4-7;  Surgeon: Erline Levine, MD;  Location: [place] NEURO ORS;  Service: Neurosurgery;  Laterality: N/A;  . APPENDECTOMY    . carpel tunn Bilateral   . CATARACT EXTRACTION    . CATARACT EXTRACTION, BILATERAL    . CHOLECYSTECTOMY    . dental implant    . ESOPHAGOGASTRODUODENOSCOPY Left 03/21/2017   Procedure: ESOPHAGOGASTRODUODENOSCOPY (EGD);  Surgeon: Virgel Manifold, MD;  Location: [facility] ENDOSCOPY;  Service: Endoscopy;  Laterality: Left;  . ESOPHAGOGASTRODUODENOSCOPY (EGD) WITH PROPOFOL N/A 08/20/2017   Procedure:  ESOPHAGOGASTRODUODENOSCOPY (EGD) WITH PROPOFOL;  Surgeon: Virgel Manifold, MD;  Location: [facility] ENDOSCOPY;  Service: Endoscopy;  Laterality: N/A;  . ESOPHAGOGASTRODUODENOSCOPY (EGD) WITH PROPOFOL N/A 09/19/2017   Procedure: ESOPHAGOGASTRODUODENOSCOPY (EGD) WITH PROPOFOL;  Surgeon: Virgel Manifold, MD;  Location: [facility] ENDOSCOPY;  Service: Endoscopy;  Laterality: N/A;  . ESOPHAGOGASTRODUODENOSCOPY (EGD) WITH PROPOFOL N/A 10/03/2017   Procedure: ESOPHAGOGASTRODUODENOSCOPY (EGD) WITH PROPOFOL;  Surgeon: Virgel Manifold, MD;  Location: [facility] ENDOSCOPY;  Service: Endoscopy;   Laterality: N/A;  . ESOPHAGOGASTRODUODENOSCOPY (EGD) WITH PROPOFOL N/A 10/22/2017   Procedure: ESOPHAGOGASTRODUODENOSCOPY (EGD) WITH PROPOFOL;  Surgeon: Virgel Manifold, MD;  Location: [facility] ENDOSCOPY;  Service: Endoscopy;  Laterality: N/A;  . ESOPHAGOGASTRODUODENOSCOPY (EGD) WITH PROPOFOL N/A 11/18/2017   Procedure: ESOPHAGOGASTRODUODENOSCOPY (EGD) WITH PROPOFOL;  Surgeon: Virgel Manifold, MD;  Location: [facility] ENDOSCOPY;  Service: Endoscopy;  Laterality: N/A;  . ESOPHAGOGASTRODUODENOSCOPY (EGD) WITH PROPOFOL N/A 12/17/2017   Procedure: ESOPHAGOGASTRODUODENOSCOPY (EGD) WITH PROPOFOL;  Surgeon: Virgel Manifold, MD;  Location: [facility] ENDOSCOPY;  Service: Endoscopy;  Laterality: N/A;  . ESOPHAGOGASTRODUODENOSCOPY (EGD) WITH PROPOFOL N/A 01/08/2018   Procedure: ESOPHAGOGASTRODUODENOSCOPY (EGD) WITH PROPOFOL;  Surgeon: Virgel Manifold, MD;  Location: [facility] ENDOSCOPY;  Service: Endoscopy;  Laterality: N/A;  . ESOPHAGOGASTRODUODENOSCOPY (EGD) WITH PROPOFOL N/A 01/22/2018   Procedure: ESOPHAGOGASTRODUODENOSCOPY (EGD) WITH PROPOFOL;  Surgeon: Virgel Manifold, MD;  Location: [facility] ENDOSCOPY;  Service: Endoscopy;  Laterality: N/A;  . ESOPHAGOGASTRODUODENOSCOPY (EGD) WITH PROPOFOL N/A 07/15/2018   Procedure: ESOPHAGOGASTRODUODENOSCOPY (EGD) WITH PROPOFOL;  Surgeon: Virgel Manifold, MD;  Location: [facility] ENDOSCOPY;  Service: Endoscopy;  Laterality: N/A;  . EYE SURGERY    . GASTRIC BYPASS    . HERNIA REPAIR    . HIATAL HERNIA REPAIR    . JOINT REPLACEMENT Bilateral    knee  . MELANOMA EXCISION    . PORTA CATH INSERTION N/A 04/07/2017   Procedure: PORTA CATH INSERTION;  Surgeon: Algernon Huxley, MD;  Location: [place] CV LAB;  Service: Cardiovascular;  Laterality: N/A;  . POSTERIOR CERVICAL FUSION/FORAMINOTOMY N/A 07/28/2013   Procedure: Cervical four-seven  posterior cervical fusion;  Surgeon: Erline Levine, MD;  Location: [place] NEURO ORS;  Service: Neurosurgery;  Laterality: N/A;  .  TONSILLECTOMY    . TOTAL KNEE ARTHROPLASTY      Social History   Socioeconomic History  . Marital status: Widowed    Spouse name: Not on file  . Number of children: 2  . Years of education: Not on file  . Highest education level: Master's degree (e.g., MA, MS, MEng, MEd, MSW, MBA)  Occupational History  . Not on file  Social Needs  . Financial resource strain: Not hard at all  . Food insecurity    Worry: Never true    Inability: Never true  . Transportation needs    Medical: No    Non-medical: No  Tobacco Use  . Smoking status: Former Smoker    Packs/day: 1.00    Years: 20.00    Pack years: 20.00    Types: Cigarettes    Quit date: 1977    Years since quitting: 43.5  . Smokeless tobacco: Never Used  Substance and Sexual Activity  . Alcohol use: No  . Drug use: Never  . Sexual activity: Not Currently  Lifestyle  . Physical activity    Days per week: 2 days    Minutes per session: 30 min  . Stress: Not at all  Relationships  . Social connections    Talks on phone: More than three times a  week    Gets together: More than three times a week    Attends religious service: More than 4 times per year    Active member of club or organization: Yes    Attends meetings of clubs or organizations: More than 4 times per year    Relationship status: Widowed  . Intimate partner violence    Fear of current or ex partner: No    Emotionally abused: No    Physically abused: No    Forced sexual activity: No  Other Topics Concern  . Not on file  Social History Narrative   Marital Status- Widowed    Lives by herself    Employement- Retired Pharmacist, hospital   Exercise hx- Does PT     Family History  Problem Relation Age of Onset  . Breast cancer Mother 64  . Arthritis-Osteo Mother   . Breast cancer Sister 62  . Bladder Cancer Sister   . Bladder Cancer Father   . Tongue cancer Father   . Congenital heart disease Father   . Prostate cancer Brother   . Uterine cancer Maternal Aunt    . Lung cancer Paternal Uncle   . Leukemia Maternal Grandmother   . Liver cancer Paternal Grandmother   . Colon cancer Neg Hx      Current Outpatient Medications:  .  albuterol (PROVENTIL HFA;VENTOLIN HFA) 108 (90 Base) MCG/ACT inhaler, Inhale 2 puffs into the lungs every 4 (four) hours as needed for wheezing or shortness of breath., Disp: , Rfl:  .  aspirin EC 81 MG tablet, Take 1 tablet (81 mg total) by mouth every other day., Disp: , Rfl:  .  calcium carbonate (TUMS EX) 750 MG chewable tablet, Chew 1 tablet by mouth as needed for heartburn. , Disp: , Rfl:  .  chlorhexidine (PERIDEX) 0.12 % solution, Use as directed 15 mLs in the mouth or throat 2 (two) times daily., Disp: , Rfl:  .  Cholecalciferol (VITAMIN D) 2000 UNITS CAPS, Take 2,000 Units by mouth daily., Disp: , Rfl:  .  dexamethasone (DECADRON) 1 MG tablet, Take 2 tablets (2 mg total) by mouth daily., Disp: 60 tablet, Rfl: 0 .  Diclofenac Sodium 1 % CREA, Apply to left arm as needed for pain three times daily, Disp: 120 g, Rfl: 0 .  dronabinol (MARINOL) 5 MG capsule, Take 1 capsule (5 mg total) by mouth 4 (four) times daily., Disp: 120 capsule, Rfl: 0 .  fluticasone (FLONASE) 50 MCG/ACT nasal spray, Place 1-2 sprays into both nostrils daily., Disp: 16 g, Rfl: 5 .  furosemide (LASIX) 20 MG tablet, TAKE 1-2 TABLETS BY MOUTH DAILY AS NEEDED FOR EDEMA, Disp: 180 tablet, Rfl: 1 .  hydrocortisone cream 1 %, Apply 1 application topically daily as needed for itching. , Disp: , Rfl:  .  loratadine (CLARITIN) 10 MG tablet, Take 10 mg by mouth daily., Disp: , Rfl:  .  Multiple Vitamins-Minerals (MULTIVITAMIN ADULT) CHEW, Chew 1 tablet by mouth daily. , Disp: , Rfl:  .  MYRBETRIQ 25 MG TB24 tablet, TAKE ONE TABLET EVERY DAY, Disp: 30 tablet, Rfl: 5 .  oxybutynin (DITROPAN) 5 MG tablet, TAKE ONE TABLET BY MOUTH EVERY MORNING AND TAKE ONE-HALF TABLET AT BEDTIME, Disp: 45 tablet, Rfl: 3 .  pantoprazole (PROTONIX) 40 MG tablet, TAKE 1 TABLET BY  MOUTH TWICE DAILY BEFORE A MEAL, Disp: 60 tablet, Rfl: 3 .  Polyethyl Glycol-Propyl Glycol (SYSTANE OP), Apply 1 drop to eye daily as needed (dry eyes)., Disp: , Rfl:  .  potassium chloride (KLOR-CON) 20 MEQ packet, Take 20 mEq by mouth daily., Disp: 30 packet, Rfl: 0 .  pregabalin (LYRICA) 150 MG capsule, TAKE 1 CAPSULE TWICE DAILY, Disp: 60 capsule, Rfl: 3 .  prochlorperazine (COMPAZINE) 10 MG tablet, TAKE ONE TABLET BY MOUTH EVERY 6 HOURS AS NEEDED FOR NAUSEA OR VOMITING, Disp: 30 tablet, Rfl: 1 .  pyridOXINE (VITAMIN B-6) 100 MG tablet, Take 1 tablet (100 mg total) by mouth daily., Disp: 30 tablet, Rfl: 3 .  SENNA-PLUS 8.6-50 MG tablet, TAKE TWO TABLETS BY MOUTH EVERY DAY, Disp: 60 tablet, Rfl: 0 .  traMADol (ULTRAM) 50 MG tablet, Take 50 mg by mouth every 6 (six) hours as needed., Disp: , Rfl:  .  traZODone (DESYREL) 50 MG tablet, Take 0.5-1 tablets (25-50 mg total) by mouth at bedtime as needed for sleep., Disp: , Rfl:  .  trifluridine-tipiracil (LONSURF) 15-6.14 MG tablet, Take 2 tablets by mouth two (2) times daily after a meal. Take with two 26m tablets. Take only on days 1-5, 8-12. Repeat every 28days., Disp: 40 tablet, Rfl: 3 .  trifluridine-tipiracil (LONSURF) 20-8.19 MG tablet, Take 2 tablets by mouth two (2) times daily after a meal. Take with two 175mtablets. Take only on days 1-5, 8-12. Repeat every 28days., Disp: 40 tablet, Rfl: 3 .  sulfamethoxazole-trimethoprim (BACTRIM) 400-80 MG tablet, Take 0.5 tablets by mouth 2 (two) times daily., Disp: 30 tablet, Rfl: 2 No current facility-administered medications for this visit.   Facility-Administered Medications Ordered in Other Visits:  .  heparin lock flush 100 unit/mL, 500 Units, Intravenous, Once, YuEarlie ServerMD .  sodium chloride flush (NS) 0.9 % injection 10 mL, 10 mL, Intravenous, PRN, YuEarlie ServerMD, 10 mL at 01/26/18 0904 .  sodium chloride flush (NS) 0.9 % injection 10 mL, 10 mL, Intravenous, Once, YuEarlie ServerMD  Physical exam:    Today's Vitals   12/01/18 1320  BP: 123/84  Pulse: 80  Temp: 98.8 F (37.1 C)  Weight: 172 lb 4.8 oz (78.2 kg)  PainSc: 0-No pain   Body mass index is 31.51 kg/m. ECOG 2 Physical Exam  Constitutional: She is oriented to person, place, and time. No distress.  Frail.walk in.   HENT:  Head: Normocephalic and atraumatic.  Nose: Nose normal.  Mouth/Throat: Oropharynx is clear and moist. No oropharyngeal exudate.  Eyes: Pupils are equal, round, and reactive to light. Conjunctivae and EOM are normal. No scleral icterus.  Neck: Normal range of motion. Neck supple. No JVD present.  Cardiovascular: Normal rate and regular rhythm.  Murmur heard. Pulmonary/Chest: Effort normal and breath sounds normal. No respiratory distress. She has no rales. She exhibits no tenderness.  Abdominal: Soft. Bowel sounds are normal. She exhibits no distension. There is no abdominal tenderness.  Hepatomegaly.  Musculoskeletal: Normal range of motion.        General: Edema present. No tenderness or deformity.  Lymphadenopathy:    She has no cervical adenopathy.  Neurological: She is alert and oriented to person, place, and time. No cranial nerve deficit. She exhibits normal muscle tone. Coordination normal.  Skin: Skin is warm and dry. She is not diaphoretic. No erythema.  Psychiatric: Affect and judgment normal.    CMP Latest Ref Rng & Units 12/01/2018  Glucose 70 - 99 mg/dL 144(H)  BUN 8 - 23 mg/dL 19  Creatinine 0.44 - 1.00 mg/dL 1.07(H)  Sodium 135 - 145 mmol/L 137  Potassium 3.5 - 5.1 mmol/L 4.2  Chloride 98 - 111 mmol/L 99  CO2 22 - 32 mmol/L 27  Calcium 8.9 - 10.3 mg/dL 8.9  Total Protein 6.5 - 8.1 g/dL 7.1  Total Bilirubin 0.3 - 1.2 mg/dL 0.9  Alkaline Phos 38 - 126 U/L 128(H)  AST 15 - 41 U/L 78(H)  ALT 0 - 44 U/L 28   CBC Latest Ref Rng & Units 12/01/2018  WBC 4.0 - 10.5 K/uL 3.2(L)  Hemoglobin 12.0 - 15.0 g/dL 9.9(L)  Hematocrit 36.0 - 46.0 % 32.7(L)  Platelets 150 - 400 K/uL 173    RADIOGRAPHIC STUDIES: I have personally reviewed the radiological images as listed and agreed with the findings in the report.  PET scan 01/23/2018  Interval development of loculated ascites within the upper abdomen which overlies the liver and spleen and exhibits increased radiotracer uptake. Findings worrisome for peritoneal carcinomatosis. Consider further evaluation with diagnostic paracentesis. 2. Soft tissue infiltration within the omentum with increased radiotracer uptake noted. Also worrisome for peritoneal carcinomatosis. 3. New hypermetabolic internal mammary lymph nodes and right CP angle lymph node. Suspicious for metastatic adenopathy. 4. Mild FDG uptake is identified localizing to the lower third of the esophagus within SUV max of 5.15.  Bone Scan 05/08/2018  1. Several small foci of activity in anterior ribs as noted above, and metastatic involvement can not be excluded. 2. Somewhat mottled activity within the midthoracic and upper lumbar spine and metastatic involvement can not be excluded. Correlate with plain films or MRI preferably.  08/18/2018 CT chest abdomen pelvis with contrast 1. New and enlarging tumors throughout the liver compatible with progressive malignancy. 2. New 2.0 by 3.1 cm soft tissue density between the stomach in the liver, probably a malignant deposit/lymph node. 3. Stable mild circumferential wall thickening in the distal esophagus. Prior gastric bypass. 4. Other imaging findings of potential clinical significance: Aortic Atherosclerosis (ICD10-I70.0). Coronary atherosclerosis. Mild cardiomegaly. Aortic and mitral valve calcifications. Mild increase in right middle lobe atelectasis without obvious tumor in the right middle lobe. Scarring or atelectasis in the lung bases. New trace left pleural effusion. Thoracolumbar spondylosis and degenerative disc disease. Non-specific 3 mm hypodense lesion in the spleen. Stable left kidney lower pole staghorn calculus,  nonobstructive. Cutaneous thickening in the right lower quadrant anterior abdominal wall, possibly from low-grade panniculitis.  Assessment and plan  Cancer Staging Malignant neoplasm of lower third of esophagus [facility]) Staging form: Esophagus - Adenocarcinoma, AJCC 8th Edition - Clinical stage from 04/10/2017: Stage IVB (cTX, cNX, pM1) - Signed by Earlie Server, MD on 04/10/2017  1. Malignant neoplasm of overlapping sites of esophagus ([place])   2. Encounter for antineoplastic chemotherapy   3. Weight loss   4. Anemia due to antineoplastic chemotherapy   5. Severe protein-calorie malnutrition ([place])   6. Port-A-Cath in place    #Esophageal cancer, metastatic,CEA is not a marker anymore. On 4th line treatment.  Labs are reviewed and discussed with patient.  She has not received this cycle's lonsurf, possible delivery today.  Ok to start Cycle 4.   # Abdominal pain, due to liver metastasis.  Today she has no pain. On tramadol, reports pain is controlled.  She is made aware that if pain is not controlled by current regimen, can switch to stronger narcotics.   # Obtain CT scan, likely she has progressed.   # Anemia, s/p PRBC 2 weeks ago. Hemoglobin 9.9 today. Continue to monitor.  # Elevated creatinine, discussed about holding lasix for 3 days.  # weight loss/ protein calorie malnutrition, prealbumin is 11.4.  Continue dexamethasone 36m daily  for appetite stimulant. She has Marinol which she feels not helping with her appetite   #Recurrent UTI continue half tablet Bactrim twice daily prophylactically. Marland Kitchen#Depression, continue  trazodone 50 mg every evening. #Port-A-Cath in place, flush every 6 weeks  Continue follow up with palliative care.   Follow-up in after CT scan to discuss results.  We spent sufficient time to discuss many aspect of care, questions were answered to patient's satisfaction. Total face to face encounter time for this patient visit was 25 min. >50% of the time was  spent in  counseling and coordination of care.     Earlie Server, MD, PhD

## 2018-12-01 NOTE — Progress Notes (Signed)
Patient had her first episode of abdominal pain that lasted 6-8 hours.  Took a Tramadol but it took hours before it helped relieve the pain.  Does not have any pain today.

## 2018-12-01 NOTE — Progress Notes (Signed)
[place]  Telephone:([phone] [phone]   Name: [name] Date: 12/01/2018 MRN: [id number]  DOB: Sep 28, 1934  Patient Care Team: Tonia Ghent, MD as PCP - General (Family Medicine)    REASON FOR CONSULTATION: Palliative Care consult requested for this 84 y.o. female with multiple medical problems including stage IV esophageal cancer s/p chemotherapy, immunotherapy, and XRT with disease progression most recently on Keytruda. 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 Patient also has recurrent UTIs and was last hospitalized  04/12/2018  to 04/14/18 with same. Palliative care was consulted to help address goals.   SOCIAL HISTORY:     reports that she quit smoking about 43 years ago. Her smoking use included cigarettes. She has a 20.00 pack-year smoking history. She has never used smokeless tobacco. She reports that she does not drink alcohol or use drugs.   Patient is widowed.  She lives at home alone.  She has 2 sons, 1 of whom lives in [place] and the other in [place].  Patient is a retired [employer] account executive.  ADVANCE DIRECTIVES:  Patient's son, Yvone Neu, is her healthcare power of attorney.  CODE STATUS: DNR (MOST form completed on 10/23/18)  PAST MEDICAL HISTORY: Past Medical History:  Diagnosis Date  . Arthritis   . Asthma   . COPD (chronic obstructive pulmonary disease) ([place])   . Dysphonia   . Dyspnea   . Esophageal cancer ([place])   . GERD (gastroesophageal reflux disease)   . Heart murmur   . History of kidney stones   . Hypertension   . Hypokalemia 10/07/2017  . Melanoma ([place])   . OAB (overactive bladder)   . OSA on CPAP   . 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 Personal history of chemotherapy    esophageal cancer  . Personal history of radiation therapy    esophageal cancer  . Pupil asymmetry    R pupil defect  . Skin cancer   . Sleep apnea   . Spinal cord abscess     PAST SURGICAL HISTORY:  Past Surgical History:  Procedure Laterality Date  . ABDOMINAL HYSTERECTOMY    . ANTERIOR CERVICAL DECOMP/DISCECTOMY FUSION N/A 07/16/2013   Procedure: ANTERIOR CERVICAL DECOMPRESSION/DISCECTOMY FUSION mutlipleLEVELS C4-7;  Surgeon: Erline Levine, MD;  Location: [place] [facility]URO ORS;  Service: Neurosurgery;  Laterality: N/A;  . APPENDECTOMY    . carpel tunn Bilateral   . CATARACT EXTRACTION    . CATARACT EXTRACTION, BILATERAL    . CHOLECYSTECTOMY    . dental implant    . ESOPHAGOGASTRODUODENOSCOPY Left 03/21/2017   Procedure: ESOPHAGOGASTRODUODENOSCOPY (EGD);  Surgeon: Virgel Manifold, MD;  [facility]: [facility] ENDOSCOPY;  Service: Endoscopy;  Laterality: Left;  . ESOPHAGOGASTRODUODENOSCOPY (EGD) WITH PROPOFOL N/A 08/20/2017   Procedure: ESOPHAGOGASTRODUODENOSCOPY (EGD) WITH PROPOFOL;  Surgeon: Virgel Manifold, MD;  Location: [facility] ENDOSCOPY;  Service: Endoscopy;  Laterality: N/A;  . ESOPHAGOGASTRODUODENOSCOPY (EGD) WITH PROPOFOL N/A 09/19/2017   Procedure: ESOPHAGOGASTRODUODENOSCOPY (EGD) WITH PROPOFOL;  Surgeon: Virgel Manifold, MD;  Location: [facility] ENDOSCOPY;  Service: Endoscopy;  Laterality: N/A;  . ESOPHAGOGASTRODUODENOSCOPY (EGD) WITH PROPOFOL N/A 10/03/2017   Procedure: ESOPHAGOGASTRODUODENOSCOPY (EGD) WITH PROPOFOL;  Surgeon: Virgel Manifold, MD;  Location: [facility] ENDOSCOPY;  Service: Endoscopy;  Laterality: N/A;  . ESOPHAGOGASTRODUODENOSCOPY (EGD) WITH PROPOFOL N/A 10/22/2017   Procedure: ESOPHAGOGASTRODUODENOSCOPY (EGD) WITH PROPOFOL;  Surgeon: Virgel Manifold, MD;  Location: [facility] ENDOSCOPY;  Service: Endoscopy;  Laterality: N/A;  . ESOPHAGOGASTRODUODENOSCOPY (EGD) WITH PROPOFOL N/A 11/18/2017   Procedure: ESOPHAGOGASTRODUODENOSCOPY (EGD) WITH PROPOFOL;  Surgeon: Virgel Manifold, MD;  Location: [facility] ENDOSCOPY;  Service: Endoscopy;  Laterality: N/A;  . ESOPHAGOGASTRODUODENOSCOPY  (EGD) WITH PROPOFOL N/A 12/17/2017   Procedure: ESOPHAGOGASTRODUODENOSCOPY (EGD) WITH PROPOFOL;  Surgeon: Virgel Manifold, MD;  Location: [facility] ENDOSCOPY;  Service: Endoscopy;  Laterality: N/A;  . ESOPHAGOGASTRODUODENOSCOPY (EGD) WITH PROPOFOL N/A 01/08/2018   Procedure: ESOPHAGOGASTRODUODENOSCOPY (EGD) WITH PROPOFOL;  Surgeon: Virgel Manifold, MD;  Location: [facility] ENDOSCOPY;  Service: Endoscopy;  Laterality: N/A;  . ESOPHAGOGASTRODUODENOSCOPY (EGD) WITH PROPOFOL N/A 01/22/2018   Procedure: ESOPHAGOGASTRODUODENOSCOPY (EGD) WITH PROPOFOL;  Surgeon: Virgel Manifold, MD;  Location: [facility] ENDOSCOPY;  Service: Endoscopy;  Laterality: N/A;  . ESOPHAGOGASTRODUODENOSCOPY (EGD) WITH PROPOFOL N/A 07/15/2018   Procedure: ESOPHAGOGASTRODUODENOSCOPY (EGD) WITH PROPOFOL;  Surgeon: Virgel Manifold, MD;  Location: [facility] ENDOSCOPY;  Service: Endoscopy;  Laterality: N/A;  . EYE SURGERY    . GASTRIC BYPASS    . HERNIA REPAIR    . HIATAL HERNIA REPAIR    . JOINT REPLACEMENT Bilateral    knee  . MELANOMA EXCISION    . PORTA CATH INSERTION N/A 04/07/2017   Procedure: PORTA CATH INSERTION;  Surgeon: Algernon Huxley, MD;  Location: [place] CV LAB;  Service: Cardiovascular;  Laterality: N/A;  . POSTERIOR CERVICAL FUSION/FORAMINOTOMY N/A 07/28/2013   Procedure: Cervical four-seven  posterior cervical fusion;  Surgeon: Erline Levine, MD;  Location: [place] NEURO ORS;  Service: Neurosurgery;  Laterality: N/A;  . TONSILLECTOMY    . TOTAL KNEE ARTHROPLASTY      HEMATOLOGY/ONCOLOGY HISTORY:  Oncology History Overview Note  Patient is a 84 year old female who has metastatic esophageal cancer with liver involvement.  Weight loss 20 pounds for the past year prior to diagnosis.  #EGD during admission showed partially obstructive esophageal mass at the distal part of esophagus. Biopsy was taken. Pathology showed esophageal adenocarcinoma. #US biopsy of liver lesion to proof distant metastasis.  Report family history  of breast cancer,but she has been having routine mammograms.  # Lives alone.She's a former smoker.  # Molecular Testing:  #[employer] Cdx: No reportable alternations with companion diagnostic (CDx) claims.  MS stable Tumor mutation burden: Cannot be determined, CCND3 amplification: Equivocal.  Amended reports on 06/04/2017, Tumor mutational burden changed from can not be determined to "TMB -low" on the re-analyzed samples.  # HER 2 negative.   Current Treatment S/p 6 cycles of FOLFOX, PET scan showed excellent partial response from both chemotherapy and radiation, result was discussed with patient.  currently on 5-FU maintenance.  S/p palliative radiation in January 2019 # Developed esophageal stricture secondary to radiation in April and got dilation via endoscopy. EGD shoed Benign-appearing esophageal stenosis. This is a result of radiation therapy.- LA Grade C radiation esophagitis. - The previously noted esophageal mass in Nov 2018 was much decreased indicating good response to radiation/chemotherapy. - Normal stomach.- Normal examined jejunum.- No specimens collected.      Malignant neoplasm of lower third of esophagus (HCC)  03/29/2017 Initial Diagnosis   Malignant neoplasm of lower third of esophagus ([place])   01/27/2018 - 05/24/2018 Chemotherapy   The patient had PACLitaxel (TAXOL) 162 mg in sodium chloride 0.9 % 250 mL chemo infusion (</= 39m/m2), 80 mg/m2 = 162 mg, Intravenous,  Once, 4 of 7 cycles Dose modification: 65 mg/m2 (original dose 80 mg/m2, Cycle 2, Reason: Provider Judgment), 70 mg/m2 (original dose 80 mg/m2, Cycle  4, Reason: Provider Judgment), 70 mg/m2 (original dose 80 mg/m2, Cycle 4, Reason: Provider Judgment) Administration: 162 mg (01/27/2018), 162 mg (03/02/2018), 132 mg (03/09/2018), 162 mg (02/03/2018), 162 mg (02/09/2018), 162 mg (02/23/2018), 162 mg (03/23/2018), 162 mg (03/30/2018), 162 mg (04/06/2018), 138 mg (04/27/2018), 138 mg (05/04/2018), 138 mg  (05/11/2018) ramucirumab (CYRAMZA) 700 mg in sodium chloride 0.9 % 180 mL chemo infusion, 8 mg/kg = 700 mg, Intravenous, Once, 4 of 7 cycles Administration: 700 mg (01/27/2018), 700 mg (03/09/2018), 700 mg (02/09/2018), 700 mg (02/23/2018), 700 mg (03/23/2018), 700 mg (04/06/2018), 700 mg (04/27/2018), 700 mg (05/11/2018)  for chemotherapy treatment.    08/24/2018 -  Chemotherapy   The patient had [No matching medication found in this treatment plan]  for chemotherapy treatment.    Esophageal adenocarcinoma ([place])   Initial Diagnosis   Esophageal adenocarcinoma ([place])   05/27/2018 - 08/18/2018 Chemotherapy   The patient had pembrolizumab (KEYTRUDA) 200 mg in sodium chloride 0.9 % 50 mL chemo infusion, 200 mg, Intravenous, Once, 4 of 5 cycles Administration: 200 mg (05/27/2018), 200 mg (06/17/2018), 200 mg (07/08/2018), 200 mg (07/29/2018)  for chemotherapy treatment.    08/24/2018 -  Chemotherapy   The patient had [No matching medication found in this treatment plan]  for chemotherapy treatment.      ALLERGIES:  is allergic to cefuroxime; codeine; doxycycline; gabapentin; iohexol; other; oxycodone; quinolones; and synvisc [hylan g-f 20].  MEDICATIONS:  Current Outpatient Medications  Medication Sig Dispense Refill  . albuterol (PROVENTIL HFA;VENTOLIN HFA) 108 (90 Base) MCG/ACT inhaler Inhale 2 puffs into the lungs every 4 (four) hours as needed for wheezing or shortness of breath.    Marland Kitchen aspirin EC 81 MG tablet Take 1 tablet (81 mg total) by mouth every other day.    . calcium carbonate (TUMS EX) 750 MG chewable tablet Chew 1 tablet by mouth as needed for heartburn.     . chlorhexidine (PERIDEX) 0.12 % solution Use as directed 15 mLs in the mouth or throat 2 (two) times daily.    . Cholecalciferol (VITAMIN D) 2000 UNITS CAPS Take 2,000 Units by mouth daily.    Marland Kitchen dexamethasone (DECADRON) 1 MG tablet Take 2 tablets (2 mg total) by mouth daily. 60 tablet 0  . Diclofenac Sodium 1 % CREA Apply to left  arm as needed for pain three times daily 120 g 0  . dronabinol (MARINOL) 5 MG capsule Take 1 capsule (5 mg total) by mouth 4 (four) times daily. 120 capsule 0  . fluticasone (FLONASE) 50 MCG/ACT nasal spray Place 1-2 sprays into both nostrils daily. 16 g 5  . furosemide (LASIX) 20 MG tablet TAKE 1-2 TABLETS BY MOUTH DAILY AS NEEDED FOR EDEMA 180 tablet 1  . hydrocortisone cream 1 % Apply 1 application topically daily as needed for itching.     . loratadine (CLARITIN) 10 MG tablet Take 10 mg by mouth daily.    . Multiple Vitamins-Minerals (MULTIVITAMIN ADULT) CHEW Chew 1 tablet by mouth daily.     Marland Kitchen MYRBETRIQ 25 MG TB24 tablet TAKE ONE TABLET EVERY DAY 30 tablet 5  . oxybutynin (DITROPAN) 5 MG tablet TAKE ONE TABLET BY MOUTH EVERY MORNING AND TAKE ONE-HALF TABLET AT BEDTIME 45 tablet 3  . pantoprazole (PROTONIX) 40 MG tablet TAKE 1 TABLET BY MOUTH TWICE DAILY BEFORE A MEAL 60 tablet 3  . Polyethyl Glycol-Propyl Glycol (SYSTANE OP) Apply 1 drop to eye daily as needed (dry eyes).    . potassium chloride (KLOR-CON) 20 MEQ packet Take  20 mEq by mouth daily. 30 packet 0  . pregabalin (LYRICA) 150 MG capsule TAKE 1 CAPSULE TWICE DAILY 60 capsule 3  . prochlorperazine (COMPAZINE) 10 MG tablet TAKE ONE TABLET BY MOUTH EVERY 6 HOURS AS NEEDED FOR NAUSEA OR VOMITING 30 tablet 1  . pyridOXINE (VITAMIN B-6) 100 MG tablet Take 1 tablet (100 mg total) by mouth daily. 30 tablet 3  . SENNA-PLUS 8.6-50 MG tablet TAKE TWO TABLETS BY MOUTH EVERY DAY 60 tablet 0  . sulfamethoxazole-trimethoprim (BACTRIM) 400-80 MG tablet Take 0.5 tablets by mouth 2 (two) times daily. 30 tablet 2  . traMADol (ULTRAM) 50 MG tablet Take 50 mg by mouth every 6 (six) hours as needed.    . traZODone (DESYREL) 50 MG tablet Take 0.5-1 tablets (25-50 mg total) by mouth at bedtime as needed for sleep.    Marland Kitchen trifluridine-tipiracil (LONSURF) 15-6.14 MG tablet Take 2 tablets by mouth two (2) times daily after a meal. Take with two 40m tablets.  Take only on days 1-5, 8-12. Repeat every 28days. 40 tablet 3  . trifluridine-tipiracil (LONSURF) 20-8.19 MG tablet Take 2 tablets by mouth two (2) times daily after a meal. Take with two 130mtablets. Take only on days 1-5, 8-12. Repeat every 28days. 40 tablet 3   No current facility-administered medications for this visit.    Facility-Administered Medications Ordered in Other Visits  Medication Dose Route Frequency Provider Last Rate Last Dose  . heparin lock flush 100 unit/mL  500 Units Intravenous Once YuEarlie ServerMD      . sodium chloride flush (NS) 0.9 % injection 10 mL  10 mL Intravenous PRN YuEarlie ServerMD   10 mL at 01/26/18 0904  . sodium chloride flush (NS) 0.9 % injection 10 mL  10 mL Intravenous Once YuEarlie ServerMD        VITAL SIGNS: There were no vitals taken for this visit. There were no vitals filed for this visit.  Estimated body mass index is 31.51 kg/m as calculated from the following:   Height as of 07/24/18: _0  (1.575 m).   Weight as of an earlier encounter on 12/01/18: 172 lb 4.8 oz (78.2 kg).  LABS: CBC:    Component Value Date/Time   WBC 3.2 (L) 12/01/2018 1254   HGB 9.9 (L) 12/01/2018 1254   HGB 12.9 07/13/2013 0609   HCT 32.7 (L) 12/01/2018 1254   HCT 38.8 07/13/2013 0609   PLT 173 12/01/2018 1254   PLT 212 07/13/2013 0609   MCV 103.5 (H) 12/01/2018 1254   MCV 93 07/13/2013 0609   NEUTROABS 2.4 12/01/2018 1254   LYMPHSABS 0.3 (L) 12/01/2018 1254   MONOABS 0.5 12/01/2018 1254   EOSABS 0.0 12/01/2018 1254   BASOSABS 0.0 12/01/2018 1254   Comprehensive Metabolic Panel:    Component Value Date/Time   NA 137 12/01/2018 1254   NA 138 07/13/2013 0609   K 4.2 12/01/2018 1254   K 4.1 07/13/2013 0609   CL 99 12/01/2018 1254   CL 104 07/13/2013 0609   CO2 27 12/01/2018 1254   CO2 29 07/13/2013 0609   BUN 19 12/01/2018 1254   BUN 15 07/13/2013 0609   CREATININE 1.07 (H) 12/01/2018 1254   CREATININE 0.67 07/13/2013 0609   GLUCOSE 144 (H) 12/01/2018 1254    GLUCOSE 115 (H) 07/13/2013 0609   CALCIUM 8.9 12/01/2018 1254   CALCIUM 8.3 (L) 07/13/2013 0609   AST 78 (H) 12/01/2018 1254   ALT 28 12/01/2018 1254   ALKPHOS  128 (H) 12/01/2018 1254   BILITOT 0.9 12/01/2018 1254   PROT 7.1 12/01/2018 1254   ALBUMIN 3.4 (L) 12/01/2018 1254    RADIOGRAPHIC STUDIES: No results found.  PERFORMANCE STATUS (ECOG) : 1 - Symptomatic but completely ambulatory  Review of Systems Unless otherwise noted, a complete review of systems is negative.  Physical Exam General: NAD, frail appearing, thin Pulmonary: unlabored Extremities: edema LEs Skin: no rashes Neurological: Weakness but otherwise nonfocal  IMPRESSION: Routine follow-up visit made in the clinic today.   Patient continues to receive fourth line Trifluridine/Tipiracil.  Patient has had some abdominal discomfort/pain.  Liver appears larger on clinical exam per Dr. Tasia Catchings.  Abdominal CT has been ordered.  Patient appears to recognize that she may be nearing the end of her treatment regimen.  She does verbalize a desire to continue treatment for as long as possible.  She asked if it is time to "start writing my obituary."  We talked about her end-of-life including her goals of ensuring that she is comfortable and pain-free.  She tells me about her husband's death at the hospice home and would hope for an equally peaceful death.  Symptomatically, patient currently denies distressing symptoms.  She has used tramadol effectively for intermittent abdominal pain.  Oral intake is reportedly poor.  Weight down 272 pounds today from previous weight of 175 pounds 2 weeks ago.  Again encouraged patient to increase use of oral supplements.  PLAN: -Continue current scope of treatment -DNR previously completed -RTC in 3 to 4 weeks   Patient expressed understanding and was in agreement with this plan. She also understands that She can call clinic at any time with any questions, concerns, or complaints.      Time Total: 20 minutes  Visit consisted of counseling and education dealing with the complex and emotionally intense issues of symptom management and palliative care in the setting of serious and potentially life-threatening illness.Greater than 50%  of this time was spent counseling and coordinating care related to the above assessment and plan.  Signed by: Altha Harm, PhD, NP-C [phone] (Work Cell)

## 2018-12-04 ENCOUNTER — Ambulatory Visit
Admission: RE | Admit: 2018-12-04 | Discharge: 2018-12-04 | Disposition: A | Discharge status: Home / Self Care | Payer: Medicare Other | Source: Ambulatory Visit | Attending: Oncology | Admitting: Oncology

## 2018-12-04 ENCOUNTER — Other Ambulatory Visit: Payer: Self-pay

## 2018-12-04 DIAGNOSIS — C158 Malignant neoplasm of overlapping sites of esophagus: Secondary | ICD-10-CM

## 2018-12-07 ENCOUNTER — Other Ambulatory Visit: Payer: Self-pay

## 2018-12-07 ENCOUNTER — Encounter: Payer: Self-pay | Admitting: Hospice and Palliative Medicine

## 2018-12-07 ENCOUNTER — Inpatient Hospital Stay (HOSPITAL_BASED_OUTPATIENT_CLINIC_OR_DEPARTMENT_OTHER): Payer: Medicare Other | Admitting: Hospice and Palliative Medicine

## 2018-12-07 ENCOUNTER — Inpatient Hospital Stay (HOSPITAL_BASED_OUTPATIENT_CLINIC_OR_DEPARTMENT_OTHER): Payer: Medicare Other | Admitting: Oncology

## 2018-12-07 ENCOUNTER — Encounter: Payer: Self-pay | Admitting: Oncology

## 2018-12-07 ENCOUNTER — Inpatient Hospital Stay: Payer: Medicare Other

## 2018-12-07 ENCOUNTER — Telehealth: Payer: Self-pay | Admitting: *Deleted

## 2018-12-07 VITALS — BP 123/80 | HR 97 | Temp 97.4°F | Ht 62.0 in | Wt 180.6 lb

## 2018-12-07 DIAGNOSIS — C158 Malignant neoplasm of overlapping sites of esophagus: Secondary | ICD-10-CM | POA: Diagnosis not present

## 2018-12-07 DIAGNOSIS — Z85820 Personal history of malignant melanoma of skin: Secondary | ICD-10-CM

## 2018-12-07 DIAGNOSIS — N179 Acute kidney failure, unspecified: Secondary | ICD-10-CM

## 2018-12-07 DIAGNOSIS — Z9884 Bariatric surgery status: Secondary | ICD-10-CM

## 2018-12-07 DIAGNOSIS — D6481 Anemia due to antineoplastic chemotherapy: Secondary | ICD-10-CM | POA: Diagnosis not present

## 2018-12-07 DIAGNOSIS — I10 Essential (primary) hypertension: Secondary | ICD-10-CM

## 2018-12-07 DIAGNOSIS — Z515 Encounter for palliative care: Secondary | ICD-10-CM

## 2018-12-07 DIAGNOSIS — Z79899 Other long term (current) drug therapy: Secondary | ICD-10-CM

## 2018-12-07 DIAGNOSIS — Z87891 Personal history of nicotine dependence: Secondary | ICD-10-CM

## 2018-12-07 DIAGNOSIS — R4182 Altered mental status, unspecified: Secondary | ICD-10-CM

## 2018-12-07 DIAGNOSIS — G893 Neoplasm related pain (acute) (chronic): Secondary | ICD-10-CM

## 2018-12-07 DIAGNOSIS — E860 Dehydration: Secondary | ICD-10-CM

## 2018-12-07 DIAGNOSIS — C787 Secondary malignant neoplasm of liver and intrahepatic bile duct: Secondary | ICD-10-CM | POA: Diagnosis not present

## 2018-12-07 DIAGNOSIS — J449 Chronic obstructive pulmonary disease, unspecified: Secondary | ICD-10-CM

## 2018-12-07 DIAGNOSIS — N930 Postcoital and contact bleeding: Secondary | ICD-10-CM

## 2018-12-07 DIAGNOSIS — R410 Disorientation, unspecified: Secondary | ICD-10-CM

## 2018-12-07 DIAGNOSIS — E43 Unspecified severe protein-calorie malnutrition: Secondary | ICD-10-CM

## 2018-12-07 DIAGNOSIS — F329 Major depressive disorder, single episode, unspecified: Secondary | ICD-10-CM

## 2018-12-07 DIAGNOSIS — G4733 Obstructive sleep apnea (adult) (pediatric): Secondary | ICD-10-CM

## 2018-12-07 DIAGNOSIS — C159 Malignant neoplasm of esophagus, unspecified: Secondary | ICD-10-CM

## 2018-12-07 DIAGNOSIS — Z7189 Other specified counseling: Secondary | ICD-10-CM

## 2018-12-07 DIAGNOSIS — Z803 Family history of malignant neoplasm of breast: Secondary | ICD-10-CM

## 2018-12-07 DIAGNOSIS — Z7982 Long term (current) use of aspirin: Secondary | ICD-10-CM

## 2018-12-07 DIAGNOSIS — E875 Hyperkalemia: Secondary | ICD-10-CM

## 2018-12-07 LAB — COMPREHENSIVE METABOLIC PANEL
ALT: 37 U/L (ref 0–44)
AST: 98 U/L — ABNORMAL HIGH (ref 15–41)
Albumin: 3.4 g/dL — ABNORMAL LOW (ref 3.5–5.0)
Alkaline Phosphatase: 188 U/L — ABNORMAL HIGH (ref 38–126)
Anion gap: 13 (ref 5–15)
BUN: 30 mg/dL — ABNORMAL HIGH (ref 8–23)
CO2: 21 mmol/L — ABNORMAL LOW (ref 22–32)
Calcium: 9.1 mg/dL (ref 8.9–10.3)
Chloride: 97 mmol/L — ABNORMAL LOW (ref 98–111)
Creatinine, Ser: 1.29 mg/dL — ABNORMAL HIGH (ref 0.44–1.00)
GFR calc Af Amer: 45 mL/min — ABNORMAL LOW (ref >60)
GFR calc non Af Amer: 39 mL/min — ABNORMAL LOW (ref >60)
Glucose, Bld: 184 mg/dL — ABNORMAL HIGH (ref 70–99)
Potassium: 5.4 mmol/L — ABNORMAL HIGH (ref 3.5–5.1)
Sodium: 131 mmol/L — ABNORMAL LOW (ref 135–145)
Total Bilirubin: 1.3 mg/dL — ABNORMAL HIGH (ref 0.3–1.2)
Total Protein: 7.5 g/dL (ref 6.5–8.1)

## 2018-12-07 LAB — URINALYSIS, COMPLETE (UACMP) WITH MICROSCOPIC
Bilirubin Urine: NEGATIVE
Glucose, UA: NEGATIVE mg/dL
Hgb urine dipstick: NEGATIVE
Ketones, ur: NEGATIVE mg/dL
Nitrite: NEGATIVE
Protein, ur: 100 mg/dL — AB
Specific Gravity, Urine: 1.015 (ref 1.005–1.030)
pH: 5 (ref 5.0–8.0)

## 2018-12-07 LAB — CBC WITH DIFFERENTIAL/PLATELET
Abs Immature Granulocytes: 0.11 10*3/uL — ABNORMAL HIGH (ref 0.00–0.07)
Basophils Absolute: 0 10*3/uL (ref 0.0–0.1)
Basophils Relative: 0 %
Eosinophils Absolute: 0 10*3/uL (ref 0.0–0.5)
Eosinophils Relative: 0 %
HCT: 29.2 % — ABNORMAL LOW (ref 36.0–46.0)
Hemoglobin: 9.1 g/dL — ABNORMAL LOW (ref 12.0–15.0)
Immature Granulocytes: 1 %
Lymphocytes Relative: 5 %
Lymphs Abs: 0.4 10*3/uL — ABNORMAL LOW (ref 0.7–4.0)
MCH: 31.6 pg (ref 26.0–34.0)
MCHC: 31.2 g/dL (ref 30.0–36.0)
MCV: 101.4 fL — ABNORMAL HIGH (ref 80.0–100.0)
Monocytes Absolute: 0.2 10*3/uL (ref 0.1–1.0)
Monocytes Relative: 3 %
Neutro Abs: 7.8 10*3/uL — ABNORMAL HIGH (ref 1.7–7.7)
Neutrophils Relative %: 91 %
Platelets: 224 10*3/uL (ref 150–400)
RBC: 2.88 MIL/uL — ABNORMAL LOW (ref 3.87–5.11)
RDW: 19.9 % — ABNORMAL HIGH (ref 11.5–15.5)
WBC: 8.6 10*3/uL (ref 4.0–10.5)
nRBC: 0.6 % — ABNORMAL HIGH (ref 0.0–0.2)

## 2018-12-07 LAB — AMMONIA: Ammonia: <9 umol/L — ABNORMAL LOW (ref 9–35)

## 2018-12-07 NOTE — Progress Notes (Signed)
Hematology/Oncology Follow Up Note [facility]  Telephone:([phone] [phone]  Patient Care Team: Tonia Ghent, MD as PCP - General (Family Medicine)   Name of the patient: [name]  [id number]  01-29-1935   REASON FOR VISIT  follow-up for chemotherapy management of esophageal adenocarcinoma,   HISTORY OF PRESENT ILLNESS Patient is a 7-old female who has metastatic esophageal cancer with liver involvement.  Weight loss 20 pounds for the past year prior to diagnosis.  # EGD during admission showed partially obstructive esophageal mass at the distal part of esophagus. Biopsy was taken. Pathology showed esophageal adenocarcinoma. #US biopsy of liver lesion to proof distant metastasis.  Report family history of breast cancer, but she has been having routine mammograms.  # Lives alone. She's a former smoker.  # Molecular Testing:  #[employer] Cdx: No reportable alternations with companion diagnostic (CDx) claims.  MS stable Tumor mutation burden: Cannot be determined, CCND3 amplification: Equivocal.  Amended reports on 06/04/2017, Tumor mutational burden changed from "can not be determined" to "TMB -low" on the re-analyzed samples.  Tumor Proportion Score [TPS] 20%. # HER 2 negative.   Antineoplasm Treatments S/p 6 cycles of FOLFOX, PET scan showed excellent partial response from both chemotherapy and radiation, result was discussed with patient.  Patient was switched to 5-FU maintenance and to September 2019. S/p palliative radiation in January 2019 # April 2019 Developed esophageal stricture secondary to radiation in April and got dilation via endoscopy. EGD shoed Benign-appearing esophageal stenosis. This is a result of radiation therapy.- LA Grade C radiation esophagitis. - The previously noted esophageal mass in Nov 2018 was much decreased indicating good response to radiation/chemotherapy. - Normal stomach.- Normal examined jejunum.-  No specimens collected.   #01/23/2018 PET scan showed interval development of loculated ascites within the upper abdomen with overlies the liver and the spleen and exhibited increased radio tracer uptake.  Worrisome for peritoneal carcinomatosis.  Soft tissue infiltration within the omentum with increased radiotracer uptake noted.  Worrisome for peritoneal carcinomatosis.  New hypermetabolic internal mammary lymph nodes and a right CP angle lymph node.  Suspicious for metastatic adenopathy.  Mild FDG uptake is identified localizing to the lower third of the esophagus raising SUV max of 5.15.  # 01/26/2018 2nd line treatment Taxol and Cyramza  12/31/20219  CT chest abdomen pelvis unfortunately showed mixed response. # 05/27/2018 3nd line treatment with pembrolizumab.  07/21/2018 MRI of brain showed a subacute stroke. Patient was advised to restart on aspirin 81 mg.  And to follow-up with primary care physician. Patient has been seen by PCP and had a discussion about subacute stroke.  Patient stopped using aspirin because of increased bruising.  Decision was made to take aspirin 81 mg every other day.  Statin was not started given patient's metastatic esophageal cancer setting and a limited life span.  Metoprolol was discontinued due to low blood pressure.  # 08/18/2018 CT chest abdomen pelvis showed progression.  08/31/2018 started on oral chemotherapy Lonsurf 35 mg/m BID, day 1-5, and day 8-12 every 28 cycles.  INTERVAL HISTORY  84 y.o. female with history of metastatic esophageal cancer present for evaluation while on oral chemotherapy LONGSURF for the treatment of esophageal cancer. Patient started cycle 4 Lonsurf treatment 1 week ago.  She has increased abdominal pain.  On physical examination hepatomegaly increases. Obtain CT chest abdomen pelvis. Patient present to discuss image results. Per patient's request, patient's son Crystalee Ventress was called and he is able to overheard  our discussion and  able to participate in discussion. Can also provided some history as well. Patient reports that she is feeling okay today. Not eating very well.  Poor appetite. Patient son reports that patient's mentation has decreased last week.  Sleeping 15 to 20 hours a day. Chronic urinary urgency.  On prophylactic antibiotics. She uses Lasix 20 mg daily for lower extremity swelling.   Review of Systems  Constitutional: Positive for appetite change, fatigue and unexpected weight change. Negative for chills and fever.  HENT:   Negative for hearing loss and voice change.   Eyes: Negative for eye problems.  Respiratory: Negative for chest tightness and cough.   Cardiovascular: Positive for leg swelling. Negative for chest pain.  Gastrointestinal: Negative for abdominal distention, abdominal pain, blood in stool and nausea.  Endocrine: Negative for hot flashes.  Genitourinary: Positive for frequency. Negative for difficulty urinating and dysuria.   Musculoskeletal: Negative for arthralgias and flank pain.  Skin: Negative for itching and rash.  Neurological: Positive for numbness. Negative for extremity weakness.  Hematological: Negative for adenopathy.  Psychiatric/Behavioral: Positive for confusion.     Allergies  Allergen Reactions  . Cefuroxime Nausea Only  . Codeine Nausea Only  . Doxycycline Other (See Comments)    Lip swelling  . Gabapentin Other (See Comments)    Swelling.   . Iohexol Itching    07-15-13 pt developed itching on fingers after contrast given. Dr. Irish Elders looked at pt and said to put in system as allergy. BB  . Other Other (See Comments)    Contrast Dye- caused fever, chills, awful feeling Bandages - itching, rash  . Oxycodone Other (See Comments)    nausea  . Quinolones Swelling    Lip swelling  . Synvisc [Hylan G-F 20] Swelling     Past Medical History:  Diagnosis Date  . Arthritis   . Asthma   . COPD (chronic obstructive pulmonary disease) ([place])   . Dysphonia    . Dyspnea   . Esophageal cancer ([place])   . GERD (gastroesophageal reflux disease)   . Heart murmur   . History of kidney stones   . Hypertension   . Hypokalemia 10/07/2017  . Melanoma ([place])   . OAB (overactive bladder)   . OSA on CPAP   . 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 Personal history of chemotherapy    esophageal cancer  . Personal history of radiation therapy    esophageal cancer  . Pupil asymmetry    R pupil defect  . Skin cancer   . Sleep apnea   . Spinal cord abscess      Past Surgical History:  Procedure Laterality Date  . ABDOMINAL HYSTERECTOMY    . ANTERIOR CERVICAL DECOMP/DISCECTOMY FUSION N/A 07/16/2013   Procedure: ANTERIOR CERVICAL DECOMPRESSION/DISCECTOMY FUSION mutlipleLEVELS C4-7;  Surgeon: Erline Levine, MD;  Location: [place] NEURO ORS;  Service: Neurosurgery;  Laterality: N/A;  . APPENDECTOMY    . carpel tunn Bilateral   . CATARACT EXTRACTION    . CATARACT EXTRACTION, BILATERAL    . CHOLECYSTECTOMY    . dental implant    . ESOPHAGOGASTRODUODENOSCOPY Left 03/21/2017   Procedure: ESOPHAGOGASTRODUODENOSCOPY (EGD);  Surgeon: Virgel Manifold, MD;  Location: [facility] ENDOSCOPY;  Service: Endoscopy;  Laterality: Left;  . ESOPHAGOGASTRODUODENOSCOPY (EGD) WITH PROPOFOL N/A 08/20/2017   Procedure: ESOPHAGOGASTRODUODENOSCOPY (EGD) WITH PROPOFOL;  Surgeon: Virgel Manifold, MD;  Location: [facility] ENDOSCOPY;  Service: Endoscopy;  Laterality: N/A;  . ESOPHAGOGASTRODUODENOSCOPY (EGD) WITH PROPOFOL  N/A 09/19/2017   Procedure: ESOPHAGOGASTRODUODENOSCOPY (EGD) WITH PROPOFOL;  Surgeon: Virgel Manifold, MD;  Location: [facility] ENDOSCOPY;  Service: Endoscopy;  Laterality: N/A;  . ESOPHAGOGASTRODUODENOSCOPY (EGD) WITH PROPOFOL N/A 10/03/2017   Procedure: ESOPHAGOGASTRODUODENOSCOPY (EGD) WITH PROPOFOL;  Surgeon: Virgel Manifold, MD;  Location: [facility] ENDOSCOPY;  Service: Endoscopy;  Laterality: N/A;  . ESOPHAGOGASTRODUODENOSCOPY (EGD) WITH PROPOFOL N/A 10/22/2017    Procedure: ESOPHAGOGASTRODUODENOSCOPY (EGD) WITH PROPOFOL;  Surgeon: Virgel Manifold, MD;  Location: [facility] ENDOSCOPY;  Service: Endoscopy;  Laterality: N/A;  . ESOPHAGOGASTRODUODENOSCOPY (EGD) WITH PROPOFOL N/A 11/18/2017   Procedure: ESOPHAGOGASTRODUODENOSCOPY (EGD) WITH PROPOFOL;  Surgeon: Virgel Manifold, MD;  Location: [facility] ENDOSCOPY;  Service: Endoscopy;  Laterality: N/A;  . ESOPHAGOGASTRODUODENOSCOPY (EGD) WITH PROPOFOL N/A 12/17/2017   Procedure: ESOPHAGOGASTRODUODENOSCOPY (EGD) WITH PROPOFOL;  Surgeon: Virgel Manifold, MD;  Location: [facility] ENDOSCOPY;  Service: Endoscopy;  Laterality: N/A;  . ESOPHAGOGASTRODUODENOSCOPY (EGD) WITH PROPOFOL N/A 01/08/2018   Procedure: ESOPHAGOGASTRODUODENOSCOPY (EGD) WITH PROPOFOL;  Surgeon: Virgel Manifold, MD;  Location: [facility] ENDOSCOPY;  Service: Endoscopy;  Laterality: N/A;  . ESOPHAGOGASTRODUODENOSCOPY (EGD) WITH PROPOFOL N/A 01/22/2018   Procedure: ESOPHAGOGASTRODUODENOSCOPY (EGD) WITH PROPOFOL;  Surgeon: Virgel Manifold, MD;  Location: [facility] ENDOSCOPY;  Service: Endoscopy;  Laterality: N/A;  . ESOPHAGOGASTRODUODENOSCOPY (EGD) WITH PROPOFOL N/A 07/15/2018   Procedure: ESOPHAGOGASTRODUODENOSCOPY (EGD) WITH PROPOFOL;  Surgeon: Virgel Manifold, MD;  Location: [facility] ENDOSCOPY;  Service: Endoscopy;  Laterality: N/A;  . EYE SURGERY    . GASTRIC BYPASS    . HERNIA REPAIR    . HIATAL HERNIA REPAIR    . JOINT REPLACEMENT Bilateral    knee  . MELANOMA EXCISION    . PORTA CATH INSERTION N/A 04/07/2017   Procedure: PORTA CATH INSERTION;  Surgeon: Algernon Huxley, MD;  Location: [place] CV LAB;  Service: Cardiovascular;  Laterality: N/A;  . POSTERIOR CERVICAL FUSION/FORAMINOTOMY N/A 07/28/2013   Procedure: Cervical four-seven  posterior cervical fusion;  Surgeon: Erline Levine, MD;  Location: [place] NEURO ORS;  Service: Neurosurgery;  Laterality: N/A;  . TONSILLECTOMY    . TOTAL KNEE ARTHROPLASTY      Social History   Socioeconomic History   . Marital status: Widowed    Spouse name: Not on file  . Number of children: 2  . Years of education: Not on file  . Highest education level: Master's degree (e.g., MA, MS, MEng, MEd, MSW, MBA)  Occupational History  . Not on file  Social Needs  . Financial resource strain: Not hard at all  . Food insecurity    Worry: Never true    Inability: Never true  . Transportation needs    Medical: No    Non-medical: No  Tobacco Use  . Smoking status: Former Smoker    Packs/day: 1.00    Years: 20.00    Pack years: 20.00    Types: Cigarettes    Quit date: 1977    Years since quitting: 43.5  . Smokeless tobacco: Never Used  Substance and Sexual Activity  . Alcohol use: No  . Drug use: Never  . Sexual activity: Not Currently  Lifestyle  . Physical activity    Days per week: 2 days    Minutes per session: 30 min  . Stress: Not at all  Relationships  . Social connections    Talks on phone: More than three times a week    Gets together: More than three times a week    Attends religious service: More than 4 times per year  Active member of club or organization: Yes    Attends meetings of clubs or organizations: More than 4 times per year    Relationship status: Widowed  . Intimate partner violence    Fear of current or ex partner: No    Emotionally abused: No    Physically abused: No    Forced sexual activity: No  Other Topics Concern  . Not on file  Social History Narrative   Marital Status- Widowed    Lives by herself    Employement- Retired Pharmacist, hospital   Exercise hx- Does PT     Family History  Problem Relation Age of Onset  . Breast cancer Mother 37  . Arthritis-Osteo Mother   . Breast cancer Sister 30  . Bladder Cancer Sister   . Bladder Cancer Father   . Tongue cancer Father   . Congenital heart disease Father   . Prostate cancer Brother   . Uterine cancer Maternal Aunt   . Lung cancer Paternal Uncle   . Leukemia Maternal Grandmother   . Liver cancer Paternal  Grandmother   . Colon cancer Neg Hx      Current Outpatient Medications:  .  albuterol (PROVENTIL HFA;VENTOLIN HFA) 108 (90 Base) MCG/ACT inhaler, Inhale 2 puffs into the lungs every 4 (four) hours as needed for wheezing or shortness of breath., Disp: , Rfl:  .  aspirin EC 81 MG tablet, Take 1 tablet (81 mg total) by mouth every other day., Disp: , Rfl:  .  calcium carbonate (TUMS EX) 750 MG chewable tablet, Chew 1 tablet by mouth as needed for heartburn. , Disp: , Rfl:  .  chlorhexidine (PERIDEX) 0.12 % solution, Use as directed 15 mLs in the mouth or throat 2 (two) times daily., Disp: , Rfl:  .  Cholecalciferol (VITAMIN D) 2000 UNITS CAPS, Take 2,000 Units by mouth daily., Disp: , Rfl:  .  dexamethasone (DECADRON) 1 MG tablet, Take 2 tablets (2 mg total) by mouth daily., Disp: 60 tablet, Rfl: 0 .  Diclofenac Sodium 1 % CREA, Apply to left arm as needed for pain three times daily, Disp: 120 g, Rfl: 0 .  dronabinol (MARINOL) 5 MG capsule, Take 1 capsule (5 mg total) by mouth 4 (four) times daily., Disp: 120 capsule, Rfl: 0 .  fluticasone (FLONASE) 50 MCG/ACT nasal spray, Place 1-2 sprays into both nostrils daily., Disp: 16 g, Rfl: 5 .  furosemide (LASIX) 20 MG tablet, TAKE 1-2 TABLETS BY MOUTH DAILY AS NEEDED FOR EDEMA, Disp: 180 tablet, Rfl: 1 .  hydrocortisone cream 1 %, Apply 1 application topically daily as needed for itching. , Disp: , Rfl:  .  loratadine (CLARITIN) 10 MG tablet, Take 10 mg by mouth daily., Disp: , Rfl:  .  Multiple Vitamins-Minerals (MULTIVITAMIN ADULT) CHEW, Chew 1 tablet by mouth daily. , Disp: , Rfl:  .  MYRBETRIQ 25 MG TB24 tablet, TAKE ONE TABLET EVERY DAY, Disp: 30 tablet, Rfl: 5 .  oxybutynin (DITROPAN) 5 MG tablet, TAKE ONE TABLET BY MOUTH EVERY MORNING AND TAKE ONE-HALF TABLET AT BEDTIME, Disp: 45 tablet, Rfl: 3 .  pantoprazole (PROTONIX) 40 MG tablet, TAKE 1 TABLET BY MOUTH TWICE DAILY BEFORE A MEAL, Disp: 60 tablet, Rfl: 3 .  Polyethyl Glycol-Propyl Glycol  (SYSTANE OP), Apply 1 drop to eye daily as needed (dry eyes)., Disp: , Rfl:  .  potassium chloride (KLOR-CON) 20 MEQ packet, Take 20 mEq by mouth daily., Disp: 30 packet, Rfl: 0 .  pregabalin (LYRICA) 150 MG capsule,  TAKE 1 CAPSULE TWICE DAILY, Disp: 60 capsule, Rfl: 3 .  prochlorperazine (COMPAZINE) 10 MG tablet, TAKE ONE TABLET BY MOUTH EVERY 6 HOURS AS NEEDED FOR NAUSEA OR VOMITING, Disp: 30 tablet, Rfl: 1 .  pyridOXINE (VITAMIN B-6) 100 MG tablet, Take 1 tablet (100 mg total) by mouth daily., Disp: 30 tablet, Rfl: 3 .  SENNA-PLUS 8.6-50 MG tablet, TAKE TWO TABLETS BY MOUTH EVERY DAY, Disp: 60 tablet, Rfl: 0 .  sulfamethoxazole-trimethoprim (BACTRIM) 400-80 MG tablet, Take 0.5 tablets by mouth 2 (two) times daily., Disp: 30 tablet, Rfl: 2 .  traMADol (ULTRAM) 50 MG tablet, Take 50 mg by mouth every 6 (six) hours as needed., Disp: , Rfl:  .  traZODone (DESYREL) 50 MG tablet, Take 0.5-1 tablets (25-50 mg total) by mouth at bedtime as needed for sleep., Disp: , Rfl:  .  trifluridine-tipiracil (LONSURF) 15-6.14 MG tablet, Take 2 tablets by mouth two (2) times daily after a meal. Take with two 58m tablets. Take only on days 1-5, 8-12. Repeat every 28days., Disp: 40 tablet, Rfl: 3 .  trifluridine-tipiracil (LONSURF) 20-8.19 MG tablet, Take 2 tablets by mouth two (2) times daily after a meal. Take with two 133mtablets. Take only on days 1-5, 8-12. Repeat every 28days., Disp: 40 tablet, Rfl: 3 No current facility-administered medications for this visit.   Facility-Administered Medications Ordered in Other Visits:  .  heparin lock flush 100 unit/mL, 500 Units, Intravenous, Once, YuEarlie ServerMD .  sodium chloride flush (NS) 0.9 % injection 10 mL, 10 mL, Intravenous, PRN, YuEarlie ServerMD, 10 mL at 01/26/18 0904 .  sodium chloride flush (NS) 0.9 % injection 10 mL, 10 mL, Intravenous, Once, YuEarlie ServerMD  Physical exam:   Today's Vitals   12/07/18 0925  BP: 123/80  Pulse: 97  Temp: (!) 97.4 F (36.3 C)   TempSrc: Tympanic  Weight: 180 lb 9 oz (81.9 kg)  Height: _0  (1.575 m)  PainSc: 0-No pain   Body mass index is 33.03 kg/m. ECOG 2 Physical Exam  Constitutional: She is oriented to person, place, and time. No distress.  Frail.walk in.   HENT:  Head: Normocephalic and atraumatic.  Nose: Nose normal.  Mouth/Throat: Oropharynx is clear and moist. No oropharyngeal exudate.  Eyes: Pupils are equal, round, and reactive to light. Conjunctivae and EOM are normal. No scleral icterus.  Neck: Normal range of motion. Neck supple. No JVD present.  Cardiovascular: Normal rate and regular rhythm.  Murmur heard. Pulmonary/Chest: Effort normal and breath sounds normal. No respiratory distress. She has no rales. She exhibits no tenderness.  Abdominal: Soft. Bowel sounds are normal. She exhibits no distension. There is no abdominal tenderness.  Hepatomegaly.  Musculoskeletal: Normal range of motion.        General: Edema present. No tenderness or deformity.  Lymphadenopathy:    She has no cervical adenopathy.  Neurological: She is alert and oriented to person, place, and time.  Skin: Skin is warm and dry. She is not diaphoretic. No erythema.  Psychiatric: Affect and judgment normal.    CMP Latest Ref Rng & Units 12/07/2018  Glucose 70 - 99 mg/dL 184(H)  BUN 8 - 23 mg/dL 30(H)  Creatinine 0.44 - 1.00 mg/dL 1.29(H)  Sodium 135 - 145 mmol/L 131(L)  Potassium 3.5 - 5.1 mmol/L 5.4(H)  Chloride 98 - 111 mmol/L 97(L)  CO2 22 - 32 mmol/L 21(L)  Calcium 8.9 - 10.3 mg/dL 9.1  Total Protein 6.5 - 8.1 g/dL 7.5  Total Bilirubin 0.3 -  1.2 mg/dL 1.3(H)  Alkaline Phos 38 - 126 U/L 188(H)  AST 15 - 41 U/L 98(H)  ALT 0 - 44 U/L 37   CBC Latest Ref Rng & Units 12/07/2018  WBC 4.0 - 10.5 K/uL 8.6  Hemoglobin 12.0 - 15.0 g/dL 9.1(L)  Hematocrit 36.0 - 46.0 % 29.2(L)  Platelets 150 - 400 K/uL 224   RADIOGRAPHIC STUDIES: I have personally reviewed the radiological images as listed and agreed with the  findings in the report.  PET scan 01/23/2018  Interval development of loculated ascites within the upper abdomen which overlies the liver and spleen and exhibits increased radiotracer uptake. Findings worrisome for peritoneal carcinomatosis. Consider further evaluation with diagnostic paracentesis. 2. Soft tissue infiltration within the omentum with increased radiotracer uptake noted. Also worrisome for peritoneal carcinomatosis. 3. New hypermetabolic internal mammary lymph nodes and right CP angle lymph node. Suspicious for metastatic adenopathy. 4. Mild FDG uptake is identified localizing to the lower third of the esophagus within SUV max of 5.15.  Bone Scan 05/08/2018  1. Several small foci of activity in anterior ribs as noted above, and metastatic involvement can not be excluded. 2. Somewhat mottled activity within the midthoracic and upper lumbar spine and metastatic involvement can not be excluded. Correlate with plain films or MRI preferably.  08/18/2018 CT chest abdomen pelvis with contrast 1. New and enlarging tumors throughout the liver compatible with progressive malignancy. 2. New 2.0 by 3.1 cm soft tissue density between the stomach in the liver, probably a malignant deposit/lymph node. 3. Stable mild circumferential wall thickening in the distal esophagus. Prior gastric bypass. 4. Other imaging findings of potential clinical significance: Aortic Atherosclerosis (ICD10-I70.0). Coronary atherosclerosis. Mild cardiomegaly. Aortic and mitral valve calcifications. Mild increase in right middle lobe atelectasis without obvious tumor in the right middle lobe. Scarring or atelectasis in the lung bases. New trace left pleural effusion. Thoracolumbar spondylosis and degenerative disc disease. Non-specific 3 mm hypodense lesion in the spleen. Stable left kidney lower pole staghorn calculus, nonobstructive. Cutaneous thickening in the right lower quadrant anterior abdominal wall, possibly from  low-grade panniculitis.  Assessment and plan  Cancer Staging Malignant neoplasm of lower third of esophagus [facility]) Staging form: Esophagus - Adenocarcinoma, AJCC 8th Edition - Clinical stage from 04/10/2017: Stage IVB (cTX, cNX, pM1) - Signed by Earlie Server, MD on 04/10/2017  1. Malignant neoplasm of overlapping sites of esophagus ([place])   2. Intermittent confusion   3. Severe protein-calorie malnutrition ([place])   4. Goals of care, counseling/discussion   5. Hyperkalemia   6. Dehydration    #Esophageal cancer, metastatic,CEA is not a marker anymore. On 4th line treatment.  Currently on cycle 4 lonsurf CT images were independently reviewed and discussed with patient and also discussed with son over the phone. Unfortunately extensive liver metastasis has further progressed, as well as lymphadenopathy. #Goal of care was discussed.  I had a lengthy discussion with patient and son.  Overall prognosis is very poor.  Patient has progressed after fourth line chemotherapy treatment for stage IV metastatic esophagus cancer. Stop Lonsurf Response rate of fifth line option Irinotecan is likely very low, 5 to 10% chance with significant risk of diarrhea, electrolyte imbalance, worsening of patient's current condition, increase infection risk and even death. Recommend patient to consider hospice. Patient would like to proceed with treatment.  Encourage patient to discuss with family and update me. Continue follow-up with palliative care.  #Decreased mentation/intermittent confusion. Obtain UA, urine culture.  Patient has history of recurrent UTI has  been on Bactrim for prophylactic antibiotics. We will rule out urinary tract infection being the etiology of decreased mentation. Also will obtain brain MRI. I had a lengthy discussion with patient and her son that increase of cancer burden can also cause metabolic encephalopathy, leading to confusion. Check ammonia level.  #Dehydration/AKI  secondary to  poor oral intake. Stop lasix.  Recommend increase oral fluid intake. I will proceed with 1 L of IV fluid.  #Neoplasm related pain, currently on tramadol.  She feels pain is well controlled currently. # hyperkalemia, stop potassium supplements.   #Severe protein calorie malnutrition,  prealbumin is 11.4.  Continue dexamethasone 50m daily for appetite stimulant. She has Marinol which she feels not helping with her appetite  Continue follow up with palliative care.  Follow-up after MRI brain.   We spent sufficient time to discuss many aspect of care, questions were answered to patient's satisfaction. Total face to face encounter time for this patient visit was 40 min. >50% of the time was  spent in counseling and coordination of care.     ZEarlie Server MD, PhD

## 2018-12-07 NOTE — Telephone Encounter (Signed)
FMLA forms are to be faxed to our office or dropped off to [place] for completion.

## 2018-12-07 NOTE — Telephone Encounter (Signed)
Son Yvone Neu asking for a note for FMLA from Dr Tasia Catchings to be out of work with his mother. Asking for a return call [phone]

## 2018-12-07 NOTE — Progress Notes (Signed)
[place]  Telephone:([phone] [phone]   Name: [name] Date: 12/07/2018 MRN: [id number]  DOB: [birth year]  Patient Care Team: Tonia Ghent, MD as PCP - General (Family Medicine)    REASON FOR CONSULTATION: Palliative Care consult requested for this 84 y.o. female with multiple medical problems including stage IV esophageal cancer s/p chemotherapy, immunotherapy, and XRT with disease progression most recently on Keytruda. 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 Patient also has recurrent UTIs and was last hospitalized  04/12/2018  to 04/14/18 with same. Palliative care was consulted to help address goals.   SOCIAL HISTORY:     reports that she quit smoking about 43 years ago. Her smoking use included cigarettes. She has a 20.00 pack-year smoking history. She has never used smokeless tobacco. She reports that she does not drink alcohol or use drugs.   Patient is widowed.  She lives at home alone.  She has 2 sons, 1 of whom lives in [place] and the other in [place].  Patient is a retired [employer] account executive.  ADVANCE DIRECTIVES:  Patient's son, [name], is her healthcare power of attorney.  CODE STATUS: DNR (MOST form completed on 10/23/18)  PAST MEDICAL HISTORY: Past Medical History:  Diagnosis Date   Arthritis    Asthma    COPD (chronic obstructive pulmonary disease) (HCC)    Dysphonia    Dyspnea    Esophageal cancer (HCC)    GERD (gastroesophageal reflux disease)    Heart murmur    History of kidney stones    Hypertension    Hypokalemia 10/07/2017   Melanoma (HCC)    OAB (overactive bladder)    OSA on CPAP    Paresthesia    from neck down after spinal abscess   Personal history of chemotherapy    esophageal cancer   Personal history of radiation therapy    esophageal cancer   Pupil asymmetry    R pupil defect   Skin cancer    Sleep apnea    Spinal cord abscess     PAST SURGICAL HISTORY:  Past Surgical History:  Procedure Laterality Date   ABDOMINAL HYSTERECTOMY     ANTERIOR CERVICAL DECOMP/DISCECTOMY FUSION N/A 07/16/2013   Procedure: ANTERIOR CERVICAL DECOMPRESSION/DISCECTOMY FUSION mutlipleLEVELS C4-7;  Surgeon: Erline Levine, MD;  Location: [facility];  Service: Neurosurgery;  Laterality: N/A;   APPENDECTOMY     carpel tunn Bilateral    CATARACT EXTRACTION     CATARACT EXTRACTION, BILATERAL     CHOLECYSTECTOMY     dental implant     ESOPHAGOGASTRODUODENOSCOPY Left 03/21/2017   Procedure: ESOPHAGOGASTRODUODENOSCOPY (EGD);  Surgeon: Virgel Manifold, MD;  Location: [facility] ENDOSCOPY;  Service: Endoscopy;  Laterality: Left;   ESOPHAGOGASTRODUODENOSCOPY (EGD) WITH PROPOFOL N/A 08/20/2017   Procedure: ESOPHAGOGASTRODUODENOSCOPY (EGD) WITH PROPOFOL;  Surgeon: Virgel Manifold, MD;  Location: [facility] ENDOSCOPY;  Service: Endoscopy;  Laterality: N/A;   ESOPHAGOGASTRODUODENOSCOPY (EGD) WITH PROPOFOL N/A 09/19/2017   Procedure: ESOPHAGOGASTRODUODENOSCOPY (EGD) WITH PROPOFOL;  Surgeon: Virgel Manifold, MD;  Location: [facility] ENDOSCOPY;  Service: Endoscopy;  Laterality: N/A;   ESOPHAGOGASTRODUODENOSCOPY (EGD) WITH PROPOFOL N/A 10/03/2017   Procedure: ESOPHAGOGASTRODUODENOSCOPY (EGD) WITH PROPOFOL;  Surgeon: Virgel Manifold, MD;  Location: [facility] ENDOSCOPY;  Service: Endoscopy;  Laterality: N/A;   ESOPHAGOGASTRODUODENOSCOPY (EGD) WITH PROPOFOL N/A 10/22/2017   Procedure: ESOPHAGOGASTRODUODENOSCOPY (EGD) WITH PROPOFOL;  Surgeon: Virgel Manifold, MD;  Location: [facility] ENDOSCOPY;  Service: Endoscopy;  Laterality: N/A;   ESOPHAGOGASTRODUODENOSCOPY (EGD) WITH PROPOFOL N/A 11/18/2017   Procedure: ESOPHAGOGASTRODUODENOSCOPY (EGD) WITH PROPOFOL;  Surgeon: Virgel Manifold, MD;  Location: [facility] ENDOSCOPY;  Service: Endoscopy;  Laterality: N/A;   ESOPHAGOGASTRODUODENOSCOPY  (EGD) WITH PROPOFOL N/A 12/17/2017   Procedure: ESOPHAGOGASTRODUODENOSCOPY (EGD) WITH PROPOFOL;  Surgeon: Virgel Manifold, MD;  Location: [facility] ENDOSCOPY;  Service: Endoscopy;  Laterality: N/A;   ESOPHAGOGASTRODUODENOSCOPY (EGD) WITH PROPOFOL N/A 01/08/2018   Procedure: ESOPHAGOGASTRODUODENOSCOPY (EGD) WITH PROPOFOL;  Surgeon: Virgel Manifold, MD;  Location: [facility] ENDOSCOPY;  Service: Endoscopy;  Laterality: N/A;   ESOPHAGOGASTRODUODENOSCOPY (EGD) WITH PROPOFOL N/A 01/22/2018   Procedure: ESOPHAGOGASTRODUODENOSCOPY (EGD) WITH PROPOFOL;  Surgeon: Virgel Manifold, MD;  Location: [facility] ENDOSCOPY;  Service: Endoscopy;  Laterality: N/A;   ESOPHAGOGASTRODUODENOSCOPY (EGD) WITH PROPOFOL N/A 07/15/2018   Procedure: ESOPHAGOGASTRODUODENOSCOPY (EGD) WITH PROPOFOL;  Surgeon: Virgel Manifold, MD;  Location: [facility] ENDOSCOPY;  Service: Endoscopy;  Laterality: N/A;   EYE SURGERY     GASTRIC BYPASS     HERNIA REPAIR     HIATAL HERNIA REPAIR     JOINT REPLACEMENT Bilateral    knee   MELANOMA EXCISION     PORTA CATH INSERTION N/A 04/07/2017   Procedure: PORTA CATH INSERTION;  Surgeon: Algernon Huxley, MD;  Location: [facility];  Service: Cardiovascular;  Laterality: N/A;   POSTERIOR CERVICAL FUSION/FORAMINOTOMY N/A 07/28/2013   Procedure: Cervical four-seven  posterior cervical fusion;  Surgeon: Erline Levine, MD;  Location: [facility];  Service: Neurosurgery;  Laterality: N/A;   TONSILLECTOMY     TOTAL KNEE ARTHROPLASTY      HEMATOLOGY/ONCOLOGY HISTORY:  Oncology History Overview Note  Patient is a 84 year old female who has metastatic esophageal cancer with liver involvement.  Weight loss 20 pounds for the past year prior to diagnosis.  #EGD during admission showed partially obstructive esophageal mass at the distal part of esophagus. Biopsy was taken. Pathology showed esophageal adenocarcinoma. #US biopsy of liver lesion to proof distant metastasis.  Report family history  of breast cancer,but she has been having routine mammograms.  # Lives alone.She's a former smoker.  # Molecular Testing:  #[employer] Cdx: No reportable alternations with companion diagnostic (CDx) claims.  MS stable Tumor mutation burden: Cannot be determined, CCND3 amplification: Equivocal.  Amended reports on 06/04/2017, Tumor mutational burden changed from can not be determined to "TMB -low" on the re-analyzed samples.  # HER 2 negative.   Current Treatment S/p 6 cycles of FOLFOX, PET scan showed excellent partial response from both chemotherapy and radiation, result was discussed with patient.  currently on 5-FU maintenance.  S/p palliative radiation in January 2019 # Developed esophageal stricture secondary to radiation in April and got dilation via endoscopy. EGD shoed Benign-appearing esophageal stenosis. This is a result of radiation therapy.- LA Grade C radiation esophagitis. - The previously noted esophageal mass in Nov 2018 was much decreased indicating good response to radiation/chemotherapy. - Normal stomach.- Normal examined jejunum.- No specimens collected.      Malignant neoplasm of lower third of esophagus (HCC)  03/29/2017 Initial Diagnosis   Malignant neoplasm of lower third of esophagus ([place])   01/27/2018 - 05/24/2018 Chemotherapy   The patient had PACLitaxel (TAXOL) 162 mg in sodium chloride 0.9 % 250 mL chemo infusion (</= 67m/m2), 80 mg/m2 = 162 mg, Intravenous,  Once, 4 of 7 cycles Dose modification: 65 mg/m2 (original dose 80 mg/m2, Cycle 2, Reason: Provider Judgment), 70 mg/m2 (original dose 80 mg/m2, Cycle  4, Reason: Provider Judgment), 70 mg/m2 (original dose 80 mg/m2, Cycle 4, Reason: Provider Judgment) Administration: 162 mg (01/27/2018), 162 mg (03/02/2018), 132 mg (03/09/2018), 162 mg (02/03/2018), 162 mg (02/09/2018), 162 mg (02/23/2018), 162 mg (03/23/2018), 162 mg (03/30/2018), 162 mg (04/06/2018), 138 mg (04/27/2018), 138 mg (05/04/2018), 138 mg  (05/11/2018) ramucirumab (CYRAMZA) 700 mg in sodium chloride 0.9 % 180 mL chemo infusion, 8 mg/kg = 700 mg, Intravenous, Once, 4 of 7 cycles Administration: 700 mg (01/27/2018), 700 mg (03/09/2018), 700 mg (02/09/2018), 700 mg (02/23/2018), 700 mg (03/23/2018), 700 mg (04/06/2018), 700 mg (04/27/2018), 700 mg (05/11/2018)  for chemotherapy treatment.    08/24/2018 -  Chemotherapy   The patient had [No matching medication found in this treatment plan]  for chemotherapy treatment.    Esophageal adenocarcinoma ([place])   Initial Diagnosis   Esophageal adenocarcinoma ([place])   05/27/2018 - 08/18/2018 Chemotherapy   The patient had pembrolizumab (KEYTRUDA) 200 mg in sodium chloride 0.9 % 50 mL chemo infusion, 200 mg, Intravenous, Once, 4 of 5 cycles Administration: 200 mg (05/27/2018), 200 mg (06/17/2018), 200 mg (07/08/2018), 200 mg (07/29/2018)  for chemotherapy treatment.    08/24/2018 -  Chemotherapy   The patient had [No matching medication found in this treatment plan]  for chemotherapy treatment.      ALLERGIES:  is allergic to cefuroxime; codeine; doxycycline; gabapentin; iohexol; other; oxycodone; quinolones; and synvisc [hylan g-f 20].  MEDICATIONS:  Current Outpatient Medications  Medication Sig Dispense Refill   albuterol (PROVENTIL HFA;VENTOLIN HFA) 108 (90 Base) MCG/ACT inhaler Inhale 2 puffs into the lungs every 4 (four) hours as needed for wheezing or shortness of breath.     aspirin EC 81 MG tablet Take 1 tablet (81 mg total) by mouth every other day.     calcium carbonate (TUMS EX) 750 MG chewable tablet Chew 1 tablet by mouth as needed for heartburn.      chlorhexidine (PERIDEX) 0.12 % solution Use as directed 15 mLs in the mouth or throat 2 (two) times daily.     Cholecalciferol (VITAMIN D) 2000 UNITS CAPS Take 2,000 Units by mouth daily.     dexamethasone (DECADRON) 1 MG tablet Take 2 tablets (2 mg total) by mouth daily. 60 tablet 0   Diclofenac Sodium 1 % CREA Apply to left  arm as needed for pain three times daily 120 g 0   dronabinol (MARINOL) 5 MG capsule Take 1 capsule (5 mg total) by mouth 4 (four) times daily. 120 capsule 0   fluticasone (FLONASE) 50 MCG/ACT nasal spray Place 1-2 sprays into both nostrils daily. 16 g 5   furosemide (LASIX) 20 MG tablet TAKE 1-2 TABLETS BY MOUTH DAILY AS NEEDED FOR EDEMA 180 tablet 1   hydrocortisone cream 1 % Apply 1 application topically daily as needed for itching.      loratadine (CLARITIN) 10 MG tablet Take 10 mg by mouth daily.     Multiple Vitamins-Minerals (MULTIVITAMIN ADULT) CHEW Chew 1 tablet by mouth daily.      MYRBETRIQ 25 MG TB24 tablet TAKE ONE TABLET EVERY DAY 30 tablet 5   oxybutynin (DITROPAN) 5 MG tablet TAKE ONE TABLET BY MOUTH EVERY MORNING AND TAKE ONE-HALF TABLET AT BEDTIME 45 tablet 3   pantoprazole (PROTONIX) 40 MG tablet TAKE 1 TABLET BY MOUTH TWICE DAILY BEFORE A MEAL 60 tablet 3   Polyethyl Glycol-Propyl Glycol (SYSTANE OP) Apply 1 drop to eye daily as needed (dry eyes).     potassium chloride (KLOR-CON) 20 MEQ packet Take  20 mEq by mouth daily. 30 packet 0   pregabalin (LYRICA) 150 MG capsule TAKE 1 CAPSULE TWICE DAILY 60 capsule 3   prochlorperazine (COMPAZINE) 10 MG tablet TAKE ONE TABLET BY MOUTH EVERY 6 HOURS AS NEEDED FOR NAUSEA OR VOMITING 30 tablet 1   pyridOXINE (VITAMIN B-6) 100 MG tablet Take 1 tablet (100 mg total) by mouth daily. 30 tablet 3   SENNA-PLUS 8.6-50 MG tablet TAKE TWO TABLETS BY MOUTH EVERY DAY 60 tablet 0   sulfamethoxazole-trimethoprim (BACTRIM) 400-80 MG tablet Take 0.5 tablets by mouth 2 (two) times daily. 30 tablet 2   traMADol (ULTRAM) 50 MG tablet Take 50 mg by mouth every 6 (six) hours as needed.     traZODone (DESYREL) 50 MG tablet Take 0.5-1 tablets (25-50 mg total) by mouth at bedtime as needed for sleep.     trifluridine-tipiracil (LONSURF) 15-6.14 MG tablet Take 2 tablets by mouth two (2) times daily after a meal. Take with two '20mg'$  tablets.  Take only on days 1-5, 8-12. Repeat every 28days. 40 tablet 3   trifluridine-tipiracil (LONSURF) 20-8.19 MG tablet Take 2 tablets by mouth two (2) times daily after a meal. Take with two '15mg'$  tablets. Take only on days 1-5, 8-12. Repeat every 28days. 40 tablet 3   No current facility-administered medications for this visit.    Facility-Administered Medications Ordered in Other Visits  Medication Dose Route Frequency Provider Last Rate Last Dose   heparin lock flush 100 unit/mL  500 Units Intravenous Once Earlie Server, MD       sodium chloride flush (NS) 0.9 % injection 10 mL  10 mL Intravenous PRN Earlie Server, MD   10 mL at 01/26/18 0904   sodium chloride flush (NS) 0.9 % injection 10 mL  10 mL Intravenous Once Earlie Server, MD        VITAL SIGNS: There were no vitals taken for this visit. There were no vitals filed for this visit.  Estimated body mass index is 33.03 kg/m as calculated from the following:   Height as of an earlier encounter on 12/07/18: '5\' 2"'$  (1.575 m).   Weight as of an earlier encounter on 12/07/18: 180 lb 9 oz (81.9 kg).  LABS: CBC:    Component Value Date/Time   WBC 8.6 12/07/2018 0841   HGB 9.1 (L) 12/07/2018 0841   HGB 12.9 07/13/2013 0609   HCT 29.2 (L) 12/07/2018 0841   HCT 38.8 07/13/2013 0609   PLT 224 12/07/2018 0841   PLT 212 07/13/2013 0609   MCV 101.4 (H) 12/07/2018 0841   MCV 93 07/13/2013 0609   NEUTROABS 7.8 (H) 12/07/2018 0841   LYMPHSABS 0.4 (L) 12/07/2018 0841   MONOABS 0.2 12/07/2018 0841   EOSABS 0.0 12/07/2018 0841   BASOSABS 0.0 12/07/2018 0841   Comprehensive Metabolic Panel:    Component Value Date/Time   NA 131 (L) 12/07/2018 0841   NA 138 07/13/2013 0609   K 5.4 (H) 12/07/2018 0841   K 4.1 07/13/2013 0609   CL 97 (L) 12/07/2018 0841   CL 104 07/13/2013 0609   CO2 21 (L) 12/07/2018 0841   CO2 29 07/13/2013 0609   BUN 30 (H) 12/07/2018 0841   BUN 15 07/13/2013 0609   CREATININE 1.29 (H) 12/07/2018 0841   CREATININE 0.67 07/13/2013  0609   GLUCOSE 184 (H) 12/07/2018 0841   GLUCOSE 115 (H) 07/13/2013 0609   CALCIUM 9.1 12/07/2018 0841   CALCIUM 8.3 (L) 07/13/2013 0609   AST 98 (H) 12/07/2018 4034  ALT 37 12/07/2018 0841   ALKPHOS 188 (H) 12/07/2018 0841   BILITOT 1.3 (H) 12/07/2018 0841   PROT 7.5 12/07/2018 0841   ALBUMIN 3.4 (L) 12/07/2018 0841    RADIOGRAPHIC STUDIES: Ct Abdomen Pelvis Wo Contrast  Result Date: 12/04/2018 CLINICAL DATA:  Esophageal cancer with weight loss and abdominal pain. EXAM: CT CHEST, ABDOMEN AND PELVIS WITHOUT CONTRAST TECHNIQUE: Multidetector CT imaging of the chest, abdomen and pelvis was performed following the standard protocol without IV contrast. COMPARISON:  08/18/2018 FINDINGS: CT CHEST FINDINGS Cardiovascular: Heart is enlarged. Coronary artery calcification is evident. Atherosclerotic calcification is noted in the wall of the thoracic aorta. Right Port-A-Cath tip is positioned in the distal SVC. Mediastinum/Nodes: No mediastinal lymphadenopathy. No evidence for gross hilar lymphadenopathy although assessment is limited by the lack of intravenous contrast on today's study. The esophagus has normal imaging features. There is no axillary lymphadenopathy. Lungs/Pleura: Areas of atelectasis and/or scarring noted in the lungs bilaterally. No suspicious pulmonary nodule or mass. Tiny bilateral pleural effusions evident. Musculoskeletal: No worrisome lytic or sclerotic osseous abnormality. CT ABDOMEN PELVIS FINDINGS Hepatobiliary: Liver enlargement displaces adjacent anatomy out of the right upper quadrant abdomen. Continued interval progression of liver metastases. The lateral segment left liver lesion measures 6.4 x 5.7 cm today compared to 5.3 x 4.9 cm previously. Conglomeration of metastases measured in the mid liver previously at 8.6 x 12.7 cm is now 10.9 x 14.4 cm. Discrete lesion in the inferior liver measures 8.8 x 8.3 cm today on 64/2. The same lesion was 8.2 x 8.0 cm previously. Gallbladder  is surgically absent. No intrahepatic or extrahepatic biliary dilation. Pancreas: No focal mass lesion. No dilatation of the main duct. No intraparenchymal cyst. No peripancreatic edema. Spleen: No splenomegaly. No focal mass lesion. Adrenals/Urinary Tract: No adrenal nodule or mass. Stable appearance staghorn calculus left kidney. Right kidney unremarkable. No hydroureteronephrosis. Bladder unremarkable. Stomach/Bowel: Status post gastric bypass. No small bowel wall thickening. No small bowel dilatation. The terminal ileum is normal. The appendix is not visualized, but there is no edema or inflammation in the region of the cecum. No gross colonic mass. No colonic wall thickening. Vascular/Lymphatic: There is abdominal aortic atherosclerosis without aneurysm. Gastrohepatic ligament lymph node has progressed in the interval measuring 4.2 x 2.9 cm today compared to 3.1 x 2.0 cm previously. There is probably a 2.9 x 1.7 cm lymph node in the hepato duodenal ligament on 56/2 although this is difficult to assess given the lack of intravenous contrast material. Reproductive: The uterus is surgically absent. There is no adnexal mass. Other: Fluid seen around the liver on the prior study has resolved. Musculoskeletal: No worrisome lytic or sclerotic osseous abnormality. Degenerative changes noted lumbar spine. Stable mild skin thickening lower right anterior abdominal wall. IMPRESSION: 1. Continued further progression of the multiple hepatic metastases. 2. Bulky gastrohepatic ligament lymphadenopathy has progressed in there appears to be a new enlarged lymph node in the hepato duodenal ligament on today's exam. 3. Distal esophageal wall thickening seen previously is less prominent today. 4. Nonobstructing staghorn calculus left kidney, similar to prior. 5. Stable appearance mild skin thickening lower anterior right abdominal wall. 6.  Aortic Atherosclerois (ICD10-170.0) Electronically Signed   By: Misty Stanley M.D.   On:  12/04/2018 12:19   Ct Chest Wo Contrast  Result Date: 12/04/2018 CLINICAL DATA:  Esophageal cancer with weight loss and abdominal pain. EXAM: CT CHEST, ABDOMEN AND PELVIS WITHOUT CONTRAST TECHNIQUE: Multidetector CT imaging of the chest, abdomen and pelvis was performed  following the standard protocol without IV contrast. COMPARISON:  08/18/2018 FINDINGS: CT CHEST FINDINGS Cardiovascular: Heart is enlarged. Coronary artery calcification is evident. Atherosclerotic calcification is noted in the wall of the thoracic aorta. Right Port-A-Cath tip is positioned in the distal SVC. Mediastinum/Nodes: No mediastinal lymphadenopathy. No evidence for gross hilar lymphadenopathy although assessment is limited by the lack of intravenous contrast on today's study. The esophagus has normal imaging features. There is no axillary lymphadenopathy. Lungs/Pleura: Areas of atelectasis and/or scarring noted in the lungs bilaterally. No suspicious pulmonary nodule or mass. Tiny bilateral pleural effusions evident. Musculoskeletal: No worrisome lytic or sclerotic osseous abnormality. CT ABDOMEN PELVIS FINDINGS Hepatobiliary: Liver enlargement displaces adjacent anatomy out of the right upper quadrant abdomen. Continued interval progression of liver metastases. The lateral segment left liver lesion measures 6.4 x 5.7 cm today compared to 5.3 x 4.9 cm previously. Conglomeration of metastases measured in the mid liver previously at 8.6 x 12.7 cm is now 10.9 x 14.4 cm. Discrete lesion in the inferior liver measures 8.8 x 8.3 cm today on 64/2. The same lesion was 8.2 x 8.0 cm previously. Gallbladder is surgically absent. No intrahepatic or extrahepatic biliary dilation. Pancreas: No focal mass lesion. No dilatation of the main duct. No intraparenchymal cyst. No peripancreatic edema. Spleen: No splenomegaly. No focal mass lesion. Adrenals/Urinary Tract: No adrenal nodule or mass. Stable appearance staghorn calculus left kidney. Right kidney  unremarkable. No hydroureteronephrosis. Bladder unremarkable. Stomach/Bowel: Status post gastric bypass. No small bowel wall thickening. No small bowel dilatation. The terminal ileum is normal. The appendix is not visualized, but there is no edema or inflammation in the region of the cecum. No gross colonic mass. No colonic wall thickening. Vascular/Lymphatic: There is abdominal aortic atherosclerosis without aneurysm. Gastrohepatic ligament lymph node has progressed in the interval measuring 4.2 x 2.9 cm today compared to 3.1 x 2.0 cm previously. There is probably a 2.9 x 1.7 cm lymph node in the hepato duodenal ligament on 56/2 although this is difficult to assess given the lack of intravenous contrast material. Reproductive: The uterus is surgically absent. There is no adnexal mass. Other: Fluid seen around the liver on the prior study has resolved. Musculoskeletal: No worrisome lytic or sclerotic osseous abnormality. Degenerative changes noted lumbar spine. Stable mild skin thickening lower right anterior abdominal wall. IMPRESSION: 1. Continued further progression of the multiple hepatic metastases. 2. Bulky gastrohepatic ligament lymphadenopathy has progressed in there appears to be a new enlarged lymph node in the hepato duodenal ligament on today's exam. 3. Distal esophageal wall thickening seen previously is less prominent today. 4. Nonobstructing staghorn calculus left kidney, similar to prior. 5. Stable appearance mild skin thickening lower anterior right abdominal wall. 6.  Aortic Atherosclerois (ICD10-170.0) Electronically Signed   By: Misty Stanley M.D.   On: 12/04/2018 12:19    PERFORMANCE STATUS (ECOG) : 1 - Symptomatic but completely ambulatory  Review of Systems Unless otherwise noted, a complete review of systems is negative.  Physical Exam General: NAD, frail appearing, thin Pulmonary: unlabored Extremities: edema LEs Skin: no rashes Neurological: Weakness but otherwise  nonfocal  IMPRESSION: Routine follow-up visit made in the clinic today.   Abdominal chest CTs on 12/04/2018 revealed further progression of multiple hepatic metastases and worsening bulky gastrohepatic ligament lymphadenopathy.  Labs discussed with Dr. Tasia Catchings.  Patient appears to have worsening transaminases and bilirubin.  She appears clinically dehydrated today.  Plan is to push oral fluids and give IV fluids tomorrow.  I had a lengthy conversation  with patient and then met with her son privately to discuss goals of care.  Both recognize that cancer is worsening on fourth line treatment.  Fifth line option would not likely be associated with a good response rate and would be likely to be fairly toxic and affect her quality of life.    Patient reported feeling overwhelmed but said that she trusted Dr. Collie Siad judgment.  We discussed the option of hospice in detail.  Patient son would like to forego future chemotherapy and focus on comfort and hospice.  However, he is interested in working up patient's multiple metabolic derangements and has requested an MRI of the brain due to worsening lethargy.  Son reports that patient is sleeping 18 to 20 hours a day.  I did suggest backing off the tramadol, which son had been administering every 6 hours even in the absence of pain.  PLAN: -Best supportive care -DNR -Recommend hospice at home -Patient to return tomorrow for IV fluids and will speak with her to further clarify plan   Time Total: 60 minutes  Visit consisted of counseling and education dealing with the complex and emotionally intense issues of symptom management and palliative care in the setting of serious and potentially life-threatening illness.Greater than 50%  of this time was spent counseling and coordinating care related to the above assessment and plan.  Signed by: Altha Harm, PhD, NP-C [phone] (Work Cell)

## 2018-12-07 NOTE — Progress Notes (Signed)
Patient here today for follow up.  Patient states no new concerns today  

## 2018-12-07 NOTE — Telephone Encounter (Signed)
Yvone Neu informed he needs to contact his HR rep at work for [employer] form and fax it to [place] or drop it by the office He asked that email the fax number to him at [email or web address] which I have done

## 2018-12-08 ENCOUNTER — Inpatient Hospital Stay (HOSPITAL_BASED_OUTPATIENT_CLINIC_OR_DEPARTMENT_OTHER): Payer: Medicare Other | Admitting: Hospice and Palliative Medicine

## 2018-12-08 ENCOUNTER — Inpatient Hospital Stay: Payer: Medicare Other

## 2018-12-08 ENCOUNTER — Other Ambulatory Visit: Payer: Self-pay

## 2018-12-08 VITALS — BP 101/71 | HR 83 | Temp 97.6°F | Resp 18

## 2018-12-08 DIAGNOSIS — J449 Chronic obstructive pulmonary disease, unspecified: Secondary | ICD-10-CM

## 2018-12-08 DIAGNOSIS — D6481 Anemia due to antineoplastic chemotherapy: Secondary | ICD-10-CM

## 2018-12-08 DIAGNOSIS — E43 Unspecified severe protein-calorie malnutrition: Secondary | ICD-10-CM

## 2018-12-08 DIAGNOSIS — N390 Urinary tract infection, site not specified: Secondary | ICD-10-CM

## 2018-12-08 DIAGNOSIS — C158 Malignant neoplasm of overlapping sites of esophagus: Secondary | ICD-10-CM | POA: Diagnosis not present

## 2018-12-08 DIAGNOSIS — G4733 Obstructive sleep apnea (adult) (pediatric): Secondary | ICD-10-CM

## 2018-12-08 DIAGNOSIS — F329 Major depressive disorder, single episode, unspecified: Secondary | ICD-10-CM

## 2018-12-08 DIAGNOSIS — E860 Dehydration: Secondary | ICD-10-CM

## 2018-12-08 DIAGNOSIS — Z515 Encounter for palliative care: Secondary | ICD-10-CM | POA: Diagnosis not present

## 2018-12-08 DIAGNOSIS — Z95828 Presence of other vascular implants and grafts: Secondary | ICD-10-CM

## 2018-12-08 DIAGNOSIS — G893 Neoplasm related pain (acute) (chronic): Secondary | ICD-10-CM

## 2018-12-08 DIAGNOSIS — E876 Hypokalemia: Secondary | ICD-10-CM

## 2018-12-08 DIAGNOSIS — Z85820 Personal history of malignant melanoma of skin: Secondary | ICD-10-CM

## 2018-12-08 DIAGNOSIS — Z87891 Personal history of nicotine dependence: Secondary | ICD-10-CM

## 2018-12-08 DIAGNOSIS — Z7982 Long term (current) use of aspirin: Secondary | ICD-10-CM

## 2018-12-08 DIAGNOSIS — C159 Malignant neoplasm of esophagus, unspecified: Secondary | ICD-10-CM | POA: Diagnosis not present

## 2018-12-08 DIAGNOSIS — Z803 Family history of malignant neoplasm of breast: Secondary | ICD-10-CM

## 2018-12-08 DIAGNOSIS — Z9884 Bariatric surgery status: Secondary | ICD-10-CM

## 2018-12-08 DIAGNOSIS — Z79899 Other long term (current) drug therapy: Secondary | ICD-10-CM

## 2018-12-08 DIAGNOSIS — I10 Essential (primary) hypertension: Secondary | ICD-10-CM

## 2018-12-08 LAB — CBC WITH DIFFERENTIAL/PLATELET
Abs Immature Granulocytes: 0.12 10*3/uL — ABNORMAL HIGH (ref 0.00–0.07)
Basophils Absolute: 0 10*3/uL (ref 0.0–0.1)
Basophils Relative: 0 %
Eosinophils Absolute: 0 10*3/uL (ref 0.0–0.5)
Eosinophils Relative: 0 %
HCT: 24.8 % — ABNORMAL LOW (ref 36.0–46.0)
Hemoglobin: 7.7 g/dL — ABNORMAL LOW (ref 12.0–15.0)
Immature Granulocytes: 2 %
Lymphocytes Relative: 5 %
Lymphs Abs: 0.3 10*3/uL — ABNORMAL LOW (ref 0.7–4.0)
MCH: 31.4 pg (ref 26.0–34.0)
MCHC: 31 g/dL (ref 30.0–36.0)
MCV: 101.2 fL — ABNORMAL HIGH (ref 80.0–100.0)
Monocytes Absolute: 0.3 10*3/uL (ref 0.1–1.0)
Monocytes Relative: 4 %
Neutro Abs: 5.9 10*3/uL (ref 1.7–7.7)
Neutrophils Relative %: 89 %
Platelets: 173 10*3/uL (ref 150–400)
RBC: 2.45 MIL/uL — ABNORMAL LOW (ref 3.87–5.11)
RDW: 19.8 % — ABNORMAL HIGH (ref 11.5–15.5)
WBC: 6.6 10*3/uL (ref 4.0–10.5)
nRBC: 0.5 % — ABNORMAL HIGH (ref 0.0–0.2)

## 2018-12-08 LAB — COMPREHENSIVE METABOLIC PANEL
ALT: 32 U/L (ref 0–44)
AST: 73 U/L — ABNORMAL HIGH (ref 15–41)
Albumin: 2.9 g/dL — ABNORMAL LOW (ref 3.5–5.0)
Alkaline Phosphatase: 167 U/L — ABNORMAL HIGH (ref 38–126)
Anion gap: 9 (ref 5–15)
BUN: 34 mg/dL — ABNORMAL HIGH (ref 8–23)
CO2: 23 mmol/L (ref 22–32)
Calcium: 8.4 mg/dL — ABNORMAL LOW (ref 8.9–10.3)
Chloride: 99 mmol/L (ref 98–111)
Creatinine, Ser: 1.25 mg/dL — ABNORMAL HIGH (ref 0.44–1.00)
GFR calc Af Amer: 46 mL/min — ABNORMAL LOW (ref >60)
GFR calc non Af Amer: 40 mL/min — ABNORMAL LOW (ref >60)
Glucose, Bld: 101 mg/dL — ABNORMAL HIGH (ref 70–99)
Potassium: 5.2 mmol/L — ABNORMAL HIGH (ref 3.5–5.1)
Sodium: 131 mmol/L — ABNORMAL LOW (ref 135–145)
Total Bilirubin: 0.9 mg/dL (ref 0.3–1.2)
Total Protein: 6.6 g/dL (ref 6.5–8.1)

## 2018-12-08 MED ORDER — SODIUM CHLORIDE 0.9% FLUSH
10.0000 mL | INTRAVENOUS | Status: DC | PRN
  Administered 2018-12-08 (×2): 10 mL via INTRAVENOUS
  Filled 2018-12-08: qty 10

## 2018-12-08 MED ORDER — SODIUM CHLORIDE 0.9 % IV SOLN
Freq: Once | INTRAVENOUS | Status: AC
  Administered 2018-12-08: 10:00:00 via INTRAVENOUS
  Filled 2018-12-08: qty 250

## 2018-12-08 MED ORDER — SULFAMETHOXAZOLE-TRIMETHOPRIM 800-160 MG PO TABS: 1.0000 | ORAL_TABLET | Freq: Two times a day (BID) | ORAL | 0 refills | Status: DC

## 2018-12-08 MED ORDER — HEPARIN SOD (PORK) LOCK FLUSH 100 UNIT/ML IV SOLN
500.0000 [IU] | Freq: Once | INTRAVENOUS | Status: AC
  Administered 2018-12-08: 500 [IU] via INTRAVENOUS
  Filled 2018-12-08: qty 5

## 2018-12-08 NOTE — Telephone Encounter (Signed)
Attempting to contact [name] at [facility] but kept getting recording and not getting through to speak to anyone specific. Pt does not need Endoscopy ultrasound per Dr. Bonna Gains but she wanted to make sure that it was our order. This was a recommendation from Dr. Bonna Gains as of 07/06/2018 notes to refer to [facility] for Stent placement given refractory stricture.

## 2018-12-08 NOTE — Progress Notes (Signed)
[facility]  [phone] [phone]   Name: [name] Date: 12/08/2018 MRN: [id number]  DOB: November 30, 1934  Patient Care Team: Tonia Ghent, MD as PCP - General (Family Medicine)    REASON FOR CONSULTATION: Palliative Care consult requested for this 84 y.o. female with multiple medical problems including stage IV esophageal cancer s/p chemotherapy, immunotherapy, and XRT with disease progression most recently on Keytruda. 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 Patient also has recurrent UTIs and was last hospitalized  04/12/2018  to 04/14/18 with same. Palliative care was consulted to help address goals.   SOCIAL HISTORY:     reports that she quit smoking about 43 years ago. Her smoking use included cigarettes. She has a 20.00 pack-year smoking history. She has never used smokeless tobacco. She reports that she does not drink alcohol or use drugs.   Patient is widowed.  She lives at home alone.  She has 2 sons, 1 of whom lives in [place] and the other in [place].  Patient is a retired [employer] account executive.  ADVANCE DIRECTIVES:  Patient's son, Yvone Neu, is her healthcare power of attorney.  CODE STATUS: DNR (MOST form completed on 10/23/18)  PAST MEDICAL HISTORY: Past Medical History:  Diagnosis Date   Arthritis    Asthma    COPD (chronic obstructive pulmonary disease) (HCC)    Dysphonia    Dyspnea    Esophageal cancer (HCC)    GERD (gastroesophageal reflux disease)    Heart murmur    History of kidney stones    Hypertension    Hypokalemia 10/07/2017   Melanoma (HCC)    OAB (overactive bladder)    OSA on CPAP    Paresthesia    from neck down after spinal abscess   Personal history of chemotherapy    esophageal cancer   Personal history of radiation therapy    esophageal cancer   Pupil asymmetry    R pupil defect   Skin cancer    Sleep apnea    Spinal cord abscess     PAST SURGICAL HISTORY:  Past Surgical History:  Procedure Laterality Date   ABDOMINAL HYSTERECTOMY     ANTERIOR CERVICAL DECOMP/DISCECTOMY FUSION N/A 07/16/2013   Procedure: ANTERIOR CERVICAL DECOMPRESSION/DISCECTOMY FUSION mutlipleLEVELS C4-7;  Surgeon: Erline Levine, MD;  Location: [facility];  Service: Neurosurgery;  Laterality: N/A;   APPENDECTOMY     carpel tunn Bilateral    CATARACT EXTRACTION     CATARACT EXTRACTION, BILATERAL     CHOLECYSTECTOMY     dental implant     ESOPHAGOGASTRODUODENOSCOPY Left 03/21/2017   Procedure: ESOPHAGOGASTRODUODENOSCOPY (EGD);  Surgeon: Virgel Manifold, MD;  Location: [facility] ENDOSCOPY;  Service: Endoscopy;  Laterality: Left;   ESOPHAGOGASTRODUODENOSCOPY (EGD) WITH PROPOFOL N/A 08/20/2017   Procedure: ESOPHAGOGASTRODUODENOSCOPY (EGD) WITH PROPOFOL;  Surgeon: Virgel Manifold, MD;  Location: [facility] ENDOSCOPY;  Service: Endoscopy;  Laterality: N/A;   ESOPHAGOGASTRODUODENOSCOPY (EGD) WITH PROPOFOL N/A 09/19/2017   Procedure: ESOPHAGOGASTRODUODENOSCOPY (EGD) WITH PROPOFOL;  Surgeon: Virgel Manifold, MD;  Location: [facility] ENDOSCOPY;  Service: Endoscopy;  Laterality: N/A;   ESOPHAGOGASTRODUODENOSCOPY (EGD) WITH PROPOFOL N/A 10/03/2017   Procedure: ESOPHAGOGASTRODUODENOSCOPY (EGD) WITH PROPOFOL;  Surgeon: Virgel Manifold, MD;  Location: [facility] ENDOSCOPY;  Service: Endoscopy;  Laterality: N/A;   ESOPHAGOGASTRODUODENOSCOPY (EGD) WITH PROPOFOL N/A 10/22/2017   Procedure: ESOPHAGOGASTRODUODENOSCOPY (EGD) WITH PROPOFOL;  Surgeon: Virgel Manifold, MD;  Location: [facility] ENDOSCOPY;  Service: Endoscopy;  Laterality: N/A;   ESOPHAGOGASTRODUODENOSCOPY (EGD) WITH PROPOFOL N/A 11/18/2017   Procedure: ESOPHAGOGASTRODUODENOSCOPY (EGD) WITH PROPOFOL;  Surgeon: Virgel Manifold, MD;  Location: [facility] ENDOSCOPY;  Service: Endoscopy;  Laterality: N/A;   ESOPHAGOGASTRODUODENOSCOPY  (EGD) WITH PROPOFOL N/A 12/17/2017   Procedure: ESOPHAGOGASTRODUODENOSCOPY (EGD) WITH PROPOFOL;  Surgeon: Virgel Manifold, MD;  Location: [facility] ENDOSCOPY;  Service: Endoscopy;  Laterality: N/A;   ESOPHAGOGASTRODUODENOSCOPY (EGD) WITH PROPOFOL N/A 01/08/2018   Procedure: ESOPHAGOGASTRODUODENOSCOPY (EGD) WITH PROPOFOL;  Surgeon: Virgel Manifold, MD;  Location: [facility] ENDOSCOPY;  Service: Endoscopy;  Laterality: N/A;   ESOPHAGOGASTRODUODENOSCOPY (EGD) WITH PROPOFOL N/A 01/22/2018   Procedure: ESOPHAGOGASTRODUODENOSCOPY (EGD) WITH PROPOFOL;  Surgeon: Virgel Manifold, MD;  Location: [facility] ENDOSCOPY;  Service: Endoscopy;  Laterality: N/A;   ESOPHAGOGASTRODUODENOSCOPY (EGD) WITH PROPOFOL N/A 07/15/2018   Procedure: ESOPHAGOGASTRODUODENOSCOPY (EGD) WITH PROPOFOL;  Surgeon: Virgel Manifold, MD;  Location: [facility] ENDOSCOPY;  Service: Endoscopy;  Laterality: N/A;   EYE SURGERY     GASTRIC BYPASS     HERNIA REPAIR     HIATAL HERNIA REPAIR     JOINT REPLACEMENT Bilateral    knee   MELANOMA EXCISION     PORTA CATH INSERTION N/A 04/07/2017   Procedure: PORTA CATH INSERTION;  Surgeon: Algernon Huxley, MD;  Location: [facility];  Service: Cardiovascular;  Laterality: N/A;   POSTERIOR CERVICAL FUSION/FORAMINOTOMY N/A 07/28/2013   Procedure: Cervical four-seven  posterior cervical fusion;  Surgeon: Erline Levine, MD;  Location: [facility];  Service: Neurosurgery;  Laterality: N/A;   TONSILLECTOMY     TOTAL KNEE ARTHROPLASTY      HEMATOLOGY/ONCOLOGY HISTORY:  Oncology History Overview Note  Patient is a 84 year old female who has metastatic esophageal cancer with liver involvement.  Weight loss 20 pounds for the past year prior to diagnosis.  #EGD during admission showed partially obstructive esophageal mass at the distal part of esophagus. Biopsy was taken. Pathology showed esophageal adenocarcinoma. #US biopsy of liver lesion to proof distant metastasis.  Report family history  of breast cancer,but she has been having routine mammograms.  # Lives alone.She's a former smoker.  # Molecular Testing:  #[employer] Cdx: No reportable alternations with companion diagnostic (CDx) claims.  MS stable Tumor mutation burden: Cannot be determined, CCND3 amplification: Equivocal.  Amended reports on 06/04/2017, Tumor mutational burden changed from can not be determined to "TMB -low" on the re-analyzed samples.  # HER 2 negative.   Current Treatment S/p 6 cycles of FOLFOX, PET scan showed excellent partial response from both chemotherapy and radiation, result was discussed with patient.  currently on 5-FU maintenance.  S/p palliative radiation in January 2019 # Developed esophageal stricture secondary to radiation in April and got dilation via endoscopy. EGD shoed Benign-appearing esophageal stenosis. This is a result of radiation therapy.- LA Grade C radiation esophagitis. - The previously noted esophageal mass in Nov 2018 was much decreased indicating good response to radiation/chemotherapy. - Normal stomach.- Normal examined jejunum.- No specimens collected.      Malignant neoplasm of lower third of esophagus (HCC)  03/29/2017 Initial Diagnosis   Malignant neoplasm of lower third of esophagus ([place])   01/27/2018 - 05/24/2018 Chemotherapy   The patient had PACLitaxel (TAXOL) 162 mg in sodium chloride 0.9 % 250 mL chemo infusion (</= 67m/m2), 80 mg/m2 = 162 mg, Intravenous,  Once, 4 of 7 cycles Dose modification: 65 mg/m2 (original dose 80 mg/m2, Cycle 2, Reason: Provider Judgment), 70 mg/m2 (original dose 80 mg/m2, Cycle  4, Reason: Provider Judgment), 70 mg/m2 (original dose 80 mg/m2, Cycle 4, Reason: Provider Judgment) Administration: 162 mg (01/27/2018), 162 mg (03/02/2018), 132 mg (03/09/2018), 162 mg (02/03/2018), 162 mg (02/09/2018), 162 mg (02/23/2018), 162 mg (03/23/2018), 162 mg (03/30/2018), 162 mg (04/06/2018), 138 mg (04/27/2018), 138 mg (05/04/2018), 138 mg  (05/11/2018) ramucirumab (CYRAMZA) 700 mg in sodium chloride 0.9 % 180 mL chemo infusion, 8 mg/kg = 700 mg, Intravenous, Once, 4 of 7 cycles Administration: 700 mg (01/27/2018), 700 mg (03/09/2018), 700 mg (02/09/2018), 700 mg (02/23/2018), 700 mg (03/23/2018), 700 mg (04/06/2018), 700 mg (04/27/2018), 700 mg (05/11/2018)  for chemotherapy treatment.    08/24/2018 -  Chemotherapy   The patient had [No matching medication found in this treatment plan]  for chemotherapy treatment.    Esophageal adenocarcinoma ([place])   Initial Diagnosis   Esophageal adenocarcinoma ([place])   05/27/2018 - 08/18/2018 Chemotherapy   The patient had pembrolizumab (KEYTRUDA) 200 mg in sodium chloride 0.9 % 50 mL chemo infusion, 200 mg, Intravenous, Once, 4 of 5 cycles Administration: 200 mg (05/27/2018), 200 mg (06/17/2018), 200 mg (07/08/2018), 200 mg (07/29/2018)  for chemotherapy treatment.    08/24/2018 -  Chemotherapy   The patient had [No matching medication found in this treatment plan]  for chemotherapy treatment.      ALLERGIES:  is allergic to cefuroxime; codeine; doxycycline; gabapentin; iohexol; other; oxycodone; quinolones; and synvisc [hylan g-f 20].  MEDICATIONS:  Current Outpatient Medications  Medication Sig Dispense Refill   albuterol (PROVENTIL HFA;VENTOLIN HFA) 108 (90 Base) MCG/ACT inhaler Inhale 2 puffs into the lungs every 4 (four) hours as needed for wheezing or shortness of breath.     aspirin EC 81 MG tablet Take 1 tablet (81 mg total) by mouth every other day.     calcium carbonate (TUMS EX) 750 MG chewable tablet Chew 1 tablet by mouth as needed for heartburn.      chlorhexidine (PERIDEX) 0.12 % solution Use as directed 15 mLs in the mouth or throat 2 (two) times daily.     Cholecalciferol (VITAMIN D) 2000 UNITS CAPS Take 2,000 Units by mouth daily.     dexamethasone (DECADRON) 1 MG tablet Take 2 tablets (2 mg total) by mouth daily. 60 tablet 0   Diclofenac Sodium 1 % CREA Apply to left  arm as needed for pain three times daily 120 g 0   dronabinol (MARINOL) 5 MG capsule Take 1 capsule (5 mg total) by mouth 4 (four) times daily. 120 capsule 0   fluticasone (FLONASE) 50 MCG/ACT nasal spray Place 1-2 sprays into both nostrils daily. 16 g 5   furosemide (LASIX) 20 MG tablet TAKE 1-2 TABLETS BY MOUTH DAILY AS NEEDED FOR EDEMA 180 tablet 1   hydrocortisone cream 1 % Apply 1 application topically daily as needed for itching.      loratadine (CLARITIN) 10 MG tablet Take 10 mg by mouth daily.     Multiple Vitamins-Minerals (MULTIVITAMIN ADULT) CHEW Chew 1 tablet by mouth daily.      MYRBETRIQ 25 MG TB24 tablet TAKE ONE TABLET EVERY DAY 30 tablet 5   oxybutynin (DITROPAN) 5 MG tablet TAKE ONE TABLET BY MOUTH EVERY MORNING AND TAKE ONE-HALF TABLET AT BEDTIME 45 tablet 3   pantoprazole (PROTONIX) 40 MG tablet TAKE 1 TABLET BY MOUTH TWICE DAILY BEFORE A MEAL 60 tablet 3   Polyethyl Glycol-Propyl Glycol (SYSTANE OP) Apply 1 drop to eye daily as needed (dry eyes).     potassium chloride (KLOR-CON) 20 MEQ packet Take  20 mEq by mouth daily. 30 packet 0   pregabalin (LYRICA) 150 MG capsule TAKE 1 CAPSULE TWICE DAILY 60 capsule 3   prochlorperazine (COMPAZINE) 10 MG tablet TAKE ONE TABLET BY MOUTH EVERY 6 HOURS AS NEEDED FOR NAUSEA OR VOMITING 30 tablet 1   pyridOXINE (VITAMIN B-6) 100 MG tablet Take 1 tablet (100 mg total) by mouth daily. 30 tablet 3   SENNA-PLUS 8.6-50 MG tablet TAKE TWO TABLETS BY MOUTH EVERY DAY 60 tablet 0   sulfamethoxazole-trimethoprim (BACTRIM) 400-80 MG tablet Take 0.5 tablets by mouth 2 (two) times daily. 30 tablet 2   traMADol (ULTRAM) 50 MG tablet Take 50 mg by mouth every 6 (six) hours as needed.     traZODone (DESYREL) 50 MG tablet Take 0.5-1 tablets (25-50 mg total) by mouth at bedtime as needed for sleep.     trifluridine-tipiracil (LONSURF) 15-6.14 MG tablet Take 2 tablets by mouth two (2) times daily after a meal. Take with two 42m tablets.  Take only on days 1-5, 8-12. Repeat every 28days. 40 tablet 3   trifluridine-tipiracil (LONSURF) 20-8.19 MG tablet Take 2 tablets by mouth two (2) times daily after a meal. Take with two 145mtablets. Take only on days 1-5, 8-12. Repeat every 28days. 40 tablet 3   No current facility-administered medications for this visit.    Facility-Administered Medications Ordered in Other Visits  Medication Dose Route Frequency Provider Last Rate Last Dose   heparin lock flush 100 unit/mL  500 Units Intravenous Once YuEarlie ServerMD       sodium chloride flush (NS) 0.9 % injection 10 mL  10 mL Intravenous PRN YuEarlie ServerMD   10 mL at 01/26/18 0904   sodium chloride flush (NS) 0.9 % injection 10 mL  10 mL Intravenous Once YuEarlie ServerMD        VITAL SIGNS: There were no vitals taken for this visit. There were no vitals filed for this visit.  Estimated body mass index is 33.03 kg/m as calculated from the following:   Height as of 12/07/18: _0  (1.575 m).   Weight as of 12/07/18: 180 lb 9 oz (81.9 kg).  LABS: CBC:    Component Value Date/Time   WBC 6.6 12/08/2018 1105   HGB 7.7 (L) 12/08/2018 1105   HGB 12.9 07/13/2013 0609   HCT 24.8 (L) 12/08/2018 1105   HCT 38.8 07/13/2013 0609   PLT 173 12/08/2018 1105   PLT 212 07/13/2013 0609   MCV 101.2 (H) 12/08/2018 1105   MCV 93 07/13/2013 0609   NEUTROABS 5.9 12/08/2018 1105   LYMPHSABS 0.3 (L) 12/08/2018 1105   MONOABS 0.3 12/08/2018 1105   EOSABS 0.0 12/08/2018 1105   BASOSABS 0.0 12/08/2018 1105   Comprehensive Metabolic Panel:    Component Value Date/Time   NA 131 (L) 12/08/2018 1105   NA 138 07/13/2013 0609   K 5.2 (H) 12/08/2018 1105   K 4.1 07/13/2013 0609   CL 99 12/08/2018 1105   CL 104 07/13/2013 0609   CO2 23 12/08/2018 1105   CO2 29 07/13/2013 0609   BUN 34 (H) 12/08/2018 1105   BUN 15 07/13/2013 0609   CREATININE 1.25 (H) 12/08/2018 1105   CREATININE 0.67 07/13/2013 0609   GLUCOSE 101 (H) 12/08/2018 1105   GLUCOSE 115 (H)  07/13/2013 0609   CALCIUM 8.4 (L) 12/08/2018 1105   CALCIUM 8.3 (L) 07/13/2013 0609   AST 73 (H) 12/08/2018 1105   ALT 32 12/08/2018 1105   ALKPHOS 167 (  H) 12/08/2018 1105   BILITOT 0.9 12/08/2018 1105   PROT 6.6 12/08/2018 1105   ALBUMIN 2.9 (L) 12/08/2018 1105    RADIOGRAPHIC STUDIES: Ct Abdomen Pelvis Wo Contrast  Result Date: 12/04/2018 CLINICAL DATA:  Esophageal cancer with weight loss and abdominal pain. EXAM: CT CHEST, ABDOMEN AND PELVIS WITHOUT CONTRAST TECHNIQUE: Multidetector CT imaging of the chest, abdomen and pelvis was performed following the standard protocol without IV contrast. COMPARISON:  08/18/2018 FINDINGS: CT CHEST FINDINGS Cardiovascular: Heart is enlarged. Coronary artery calcification is evident. Atherosclerotic calcification is noted in the wall of the thoracic aorta. Right Port-A-Cath tip is positioned in the distal SVC. Mediastinum/Nodes: No mediastinal lymphadenopathy. No evidence for gross hilar lymphadenopathy although assessment is limited by the lack of intravenous contrast on today's study. The esophagus has normal imaging features. There is no axillary lymphadenopathy. Lungs/Pleura: Areas of atelectasis and/or scarring noted in the lungs bilaterally. No suspicious pulmonary nodule or mass. Tiny bilateral pleural effusions evident. Musculoskeletal: No worrisome lytic or sclerotic osseous abnormality. CT ABDOMEN PELVIS FINDINGS Hepatobiliary: Liver enlargement displaces adjacent anatomy out of the right upper quadrant abdomen. Continued interval progression of liver metastases. The lateral segment left liver lesion measures 6.4 x 5.7 cm today compared to 5.3 x 4.9 cm previously. Conglomeration of metastases measured in the mid liver previously at 8.6 x 12.7 cm is now 10.9 x 14.4 cm. Discrete lesion in the inferior liver measures 8.8 x 8.3 cm today on 64/2. The same lesion was 8.2 x 8.0 cm previously. Gallbladder is surgically absent. No intrahepatic or extrahepatic  biliary dilation. Pancreas: No focal mass lesion. No dilatation of the main duct. No intraparenchymal cyst. No peripancreatic edema. Spleen: No splenomegaly. No focal mass lesion. Adrenals/Urinary Tract: No adrenal nodule or mass. Stable appearance staghorn calculus left kidney. Right kidney unremarkable. No hydroureteronephrosis. Bladder unremarkable. Stomach/Bowel: Status post gastric bypass. No small bowel wall thickening. No small bowel dilatation. The terminal ileum is normal. The appendix is not visualized, but there is no edema or inflammation in the region of the cecum. No gross colonic mass. No colonic wall thickening. Vascular/Lymphatic: There is abdominal aortic atherosclerosis without aneurysm. Gastrohepatic ligament lymph node has progressed in the interval measuring 4.2 x 2.9 cm today compared to 3.1 x 2.0 cm previously. There is probably a 2.9 x 1.7 cm lymph node in the hepato duodenal ligament on 56/2 although this is difficult to assess given the lack of intravenous contrast material. Reproductive: The uterus is surgically absent. There is no adnexal mass. Other: Fluid seen around the liver on the prior study has resolved. Musculoskeletal: No worrisome lytic or sclerotic osseous abnormality. Degenerative changes noted lumbar spine. Stable mild skin thickening lower right anterior abdominal wall. IMPRESSION: 1. Continued further progression of the multiple hepatic metastases. 2. Bulky gastrohepatic ligament lymphadenopathy has progressed in there appears to be a new enlarged lymph node in the hepato duodenal ligament on today's exam. 3. Distal esophageal wall thickening seen previously is less prominent today. 4. Nonobstructing staghorn calculus left kidney, similar to prior. 5. Stable appearance mild skin thickening lower anterior right abdominal wall. 6.  Aortic Atherosclerois (ICD10-170.0) Electronically Signed   By: Misty Stanley M.D.   On: 12/04/2018 12:19   Ct Chest Wo Contrast  Result  Date: 12/04/2018 CLINICAL DATA:  Esophageal cancer with weight loss and abdominal pain. EXAM: CT CHEST, ABDOMEN AND PELVIS WITHOUT CONTRAST TECHNIQUE: Multidetector CT imaging of the chest, abdomen and pelvis was performed following the standard protocol without IV contrast. COMPARISON:  08/18/2018 FINDINGS: CT CHEST FINDINGS Cardiovascular: Heart is enlarged. Coronary artery calcification is evident. Atherosclerotic calcification is noted in the wall of the thoracic aorta. Right Port-A-Cath tip is positioned in the distal SVC. Mediastinum/Nodes: No mediastinal lymphadenopathy. No evidence for gross hilar lymphadenopathy although assessment is limited by the lack of intravenous contrast on today's study. The esophagus has normal imaging features. There is no axillary lymphadenopathy. Lungs/Pleura: Areas of atelectasis and/or scarring noted in the lungs bilaterally. No suspicious pulmonary nodule or mass. Tiny bilateral pleural effusions evident. Musculoskeletal: No worrisome lytic or sclerotic osseous abnormality. CT ABDOMEN PELVIS FINDINGS Hepatobiliary: Liver enlargement displaces adjacent anatomy out of the right upper quadrant abdomen. Continued interval progression of liver metastases. The lateral segment left liver lesion measures 6.4 x 5.7 cm today compared to 5.3 x 4.9 cm previously. Conglomeration of metastases measured in the mid liver previously at 8.6 x 12.7 cm is now 10.9 x 14.4 cm. Discrete lesion in the inferior liver measures 8.8 x 8.3 cm today on 64/2. The same lesion was 8.2 x 8.0 cm previously. Gallbladder is surgically absent. No intrahepatic or extrahepatic biliary dilation. Pancreas: No focal mass lesion. No dilatation of the main duct. No intraparenchymal cyst. No peripancreatic edema. Spleen: No splenomegaly. No focal mass lesion. Adrenals/Urinary Tract: No adrenal nodule or mass. Stable appearance staghorn calculus left kidney. Right kidney unremarkable. No hydroureteronephrosis. Bladder  unremarkable. Stomach/Bowel: Status post gastric bypass. No small bowel wall thickening. No small bowel dilatation. The terminal ileum is normal. The appendix is not visualized, but there is no edema or inflammation in the region of the cecum. No gross colonic mass. No colonic wall thickening. Vascular/Lymphatic: There is abdominal aortic atherosclerosis without aneurysm. Gastrohepatic ligament lymph node has progressed in the interval measuring 4.2 x 2.9 cm today compared to 3.1 x 2.0 cm previously. There is probably a 2.9 x 1.7 cm lymph node in the hepato duodenal ligament on 56/2 although this is difficult to assess given the lack of intravenous contrast material. Reproductive: The uterus is surgically absent. There is no adnexal mass. Other: Fluid seen around the liver on the prior study has resolved. Musculoskeletal: No worrisome lytic or sclerotic osseous abnormality. Degenerative changes noted lumbar spine. Stable mild skin thickening lower right anterior abdominal wall. IMPRESSION: 1. Continued further progression of the multiple hepatic metastases. 2. Bulky gastrohepatic ligament lymphadenopathy has progressed in there appears to be a new enlarged lymph node in the hepato duodenal ligament on today's exam. 3. Distal esophageal wall thickening seen previously is less prominent today. 4. Nonobstructing staghorn calculus left kidney, similar to prior. 5. Stable appearance mild skin thickening lower anterior right abdominal wall. 6.  Aortic Atherosclerois (ICD10-170.0) Electronically Signed   By: Misty Stanley M.D.   On: 12/04/2018 12:19    PERFORMANCE STATUS (ECOG) : 1 - Symptomatic but completely ambulatory  Review of Systems Unless otherwise noted, a complete review of systems is negative.  Physical Exam General: NAD, frail appearing, thin Pulmonary: unlabored Extremities: edema LEs Skin: no rashes Neurological: Weakness, some confusion   IMPRESSION: Routine follow-up visit today.  Patient  was seen while receiving IV fluids.  She is comfortable appearing and denies any acute changes or concerns.  However, I do note some confusion today.  Patient did not recall our conversation from yesterday.  Labs rechecked and are grossly unchanged.  UA from yesterday noted.  Patient is on chronic suppressive antibiotics with Bactrim.  Urine culture pending but will go ahead and increase dose of Bactrim to  twice daily x10 days.  We will continue to recommend hospice involvement at home following MRI next week.  Case and plan discussed with Dr. Tasia Catchings.  I have tried calling patient's son but was not successful in reaching him.  PLAN: -Best supportive care -Increase Bactrim to DS 1 tablet twice daily x14 days -Recommend hospice at home   Time Total: 20 minutes  Visit consisted of counseling and education dealing with the complex and emotionally intense issues of symptom management and palliative care in the setting of serious and potentially life-threatening illness.Greater than 50%  of this time was spent counseling and coordinating care related to the above assessment and plan.  Signed by: Altha Harm, PhD, NP-C [phone] (Work Cell)

## 2018-12-08 NOTE — Addendum Note (Signed)
Addended by: Earlie Server on: 12/08/2018 09:52 AM   Modules accepted: Orders

## 2018-12-10 ENCOUNTER — Inpatient Hospital Stay: Payer: Medicare Other

## 2018-12-14 ENCOUNTER — Telehealth: Payer: Self-pay

## 2018-12-14 ENCOUNTER — Encounter: Payer: Self-pay | Admitting: Family Medicine

## 2018-12-14 ENCOUNTER — Other Ambulatory Visit: Payer: Self-pay

## 2018-12-14 ENCOUNTER — Ambulatory Visit (INDEPENDENT_AMBULATORY_CARE_PROVIDER_SITE_OTHER): Payer: Medicare Other | Admitting: Family Medicine

## 2018-12-14 VITALS — BP 126/80 | HR 68 | Temp 97.7°F | Ht 62.0 in | Wt 180.0 lb

## 2018-12-14 DIAGNOSIS — R230 Cyanosis: Secondary | ICD-10-CM | POA: Diagnosis not present

## 2018-12-14 DIAGNOSIS — L03119 Cellulitis of unspecified part of limb: Secondary | ICD-10-CM

## 2018-12-14 DIAGNOSIS — L0390 Cellulitis, unspecified: Secondary | ICD-10-CM | POA: Insufficient documentation

## 2018-12-14 DIAGNOSIS — R600 Localized edema: Secondary | ICD-10-CM | POA: Diagnosis not present

## 2018-12-14 MED ORDER — AMOXICILLIN-POT CLAVULANATE 875-125 MG PO TABS: 1.0000 | ORAL_TABLET | Freq: Two times a day (BID) | ORAL | 0 refills | Status: DC

## 2018-12-14 NOTE — Telephone Encounter (Signed)
Thanks

## 2018-12-14 NOTE — Telephone Encounter (Signed)
Yvone Neu (DPR signed)left v/m requesting cb about pt having swollen leg and ? Cellulitis.pt already has in office appt with Dr Glori Bickers today at 3:15. I spoke with Yvone Neu and leg swelling and redness began about 3-4 days ago on both legs but worse on one leg Yvone Neu not sure which leg). No red streaks or pain.slight weeping from legs due to swelling of lower legs.No CP or SOB. ED precautions given and Yvone Neu voiced understanding.  FYI to DR [employer].

## 2018-12-14 NOTE — Assessment & Plan Note (Signed)
Dusky skin changes on underside of all toes (symmetric) in cancer pt under palliative care  In conjunction with pedal edema and early cellulitis  Pt take 81 mg asa  Unable to palp pulses due to edema  No h/o PVD (does have aortic stenosis)  Not a classic presentation of acute limb ischemia and no h/o pain or claudication  Will d/w pcp

## 2018-12-14 NOTE — Progress Notes (Signed)
Subjective:    Patient ID: [name], female    DOB: 1934/06/20, 84 y.o.   MRN: [id number]  HPI Pt presents with possible cellulitis on legs  84 yo pt of Dr Damita Dunnings in palliative care with h/o esophageal cancer and complex medical history (fair historian)  Taking antineoplastic agent  Also decadron  Also bactrim for uti prevention    Yesterday - legs swelled more than usual  Rash since Saturday  Now patchy red areas  Area on L leg - oozing some clear fluid   Per pt no h/o lymphedema  ? If cardiac (lasix is on her med list to use prn)  Has worn support hose in the past  Fell into a bush a week ago- got scratched up on both legs mostly the L  Not sob   Has neuropathy -so does not feel things well in feet/legs  No pain   She takes lasix for the swelling- took 1 today (per inst she can take 1-2 pills daily)  Wt Readings from Last 3 Encounters:  12/14/18 180 lb (81.6 kg)  12/07/18 180 lb 9 oz (81.9 kg)  12/01/18 172 lb 4.8 oz (78.2 kg)   32.92 kg/m   BP Readings from Last 3 Encounters:  12/14/18 126/80  12/08/18 101/71  12/07/18 123/80   Lab Results  Component Value Date   CREATININE 1.25 (H) 12/08/2018   BUN 34 (H) 12/08/2018   NA 131 (L) 12/08/2018   K 5.2 (H) 12/08/2018   CL 99 12/08/2018   CO2 23 12/08/2018   Lab Results  Component Value Date   ALT 32 12/08/2018   AST 73 (H) 12/08/2018   ALKPHOS 167 (H) 12/08/2018   BILITOT 0.9 12/08/2018    Pulse Readings from Last 3 Encounters:  12/14/18 68  12/08/18 83  12/07/18 97      Patient Active Problem List   Diagnosis Date Noted   Cellulitis 12/14/2018   Nausea without vomiting 10/23/2018   Counseling regarding advanced directives and goals of care 09/18/2018   CVA (cerebral vascular accident) ([place]) 07/26/2018   Hypotension due to drugs 07/23/2018   Encounter for antineoplastic immunotherapy    Abnormal LFTs (liver function tests)    Altered mental status    Sepsis ([place])  04/12/2018   UTI (urinary tract infection) 04/12/2018   Transaminitis 04/12/2018   Acute sinusitis 03/05/2018   Urinary incontinence 03/05/2018   Glycogenic acanthosis of the esophagus    Chronic peptic ulcer of stomach    Problems with swallowing and mastication    Other foreign object in esophagus causing other injury, initial encounter    Chronic gastric ulcer without hemorrhage and without perforation    Anastomotic ulcer    Hypokalemia 10/07/2017   Thickening of esophagus    Other specified diseases of the digestive system    Esophageal stricture    Radiation esophagitis    Adverse effect of radiation    Esophageal adenocarcinoma (HCC)    Coronary artery calcification seen on CT scan 08/14/2017   Aortic atherosclerosis ([place]) 08/14/2017   Aortic stenosis 07/07/2017   Goals of care, counseling/discussion 05/13/2017   Encounter for antineoplastic chemotherapy 05/12/2017   Tenosynovitis, nodular 05/08/2017   Malignant neoplasm of lower third of esophagus ([place]) 03/29/2017   Liver lesion    Iron deficiency anemia    Dysphagia    Weight loss    Abdominal pain 03/20/2017   Insomnia 12/19/2016   Advance care planning 12/19/2016   Hoarseness 07/11/2015  Tinnitus of both ears 07/11/2015   Vocal cord paralysis, unilateral complete 07/11/2015   Knee joint replaced by other means 03/26/2015   Paresthesia 08/26/2014   Asthma without status asthmaticus 12/03/2013   Borderline diabetes 12/03/2013   Essential hypertension 12/03/2013   OA (osteoarthritis) 12/03/2013   Morbid obesity due to excess calories ([place]) 12/03/2013   Overactive bladder 12/03/2013   RLS (restless legs syndrome) 12/03/2013   Sleep apnea 12/03/2013   C4 spinal cord injury ([place]) 12/03/2013   Dysphonia 11/24/2013   Neuropathy 10/22/2013   Polyneuropathy associated with critical illness ([place]) 10/22/2013   Spinal cord abscess 07/30/2013   OSA on CPAP 07/16/2013    Past Medical History:  Diagnosis Date   Arthritis    Asthma    COPD (chronic obstructive pulmonary disease) (HCC)    Dysphonia    Dyspnea    Esophageal cancer (HCC)    GERD (gastroesophageal reflux disease)    Heart murmur    History of kidney stones    Hypertension    Hypokalemia 10/07/2017   Melanoma (HCC)    OAB (overactive bladder)    OSA on CPAP    Paresthesia    from neck down after spinal abscess   Personal history of chemotherapy    esophageal cancer   Personal history of radiation therapy    esophageal cancer   Pupil asymmetry    R pupil defect   Skin cancer    Sleep apnea    Spinal cord abscess    Past Surgical History:  Procedure Laterality Date   ABDOMINAL HYSTERECTOMY     ANTERIOR CERVICAL DECOMP/DISCECTOMY FUSION N/A 07/16/2013   Procedure: ANTERIOR CERVICAL DECOMPRESSION/DISCECTOMY FUSION mutlipleLEVELS C4-7;  Surgeon: Erline Levine, MD;  Location: [place] [facility]UR[facility];  Service: Neurosurgery;  Laterality: N/A;   APPENDECTOMY     carpel tunn Bilateral    CATARACT EXTRACTION     CATARACT EXTRACTION, BILATERAL     CHOLECYSTECTOMY     dental implant     ESOPHAGOGASTRODUODENOSCOPY Left 03/21/2017   Procedure: ESOPHAGOGASTRODUODENOSCOPY (EGD);  Surgeon: Virgel Manifold, MD;  Location: [facility] ENDOSCOPY;  Service: Endoscopy;  Laterality: Left;   ESOPHAGOGASTRODUODENOSCOPY (EGD) WITH PROPOFOL N/A 08/20/2017   Procedure: ESOPHAGOGASTRODUODENOSCOPY (EGD) WITH PROPOFOL;  Surgeon: Virgel Manifold, MD;  Location: [facility] ENDOSCOPY;  Service: Endoscopy;  Laterality: N/A;   ESOPHAGOGASTRODUODENOSCOPY (EGD) WITH PROPOFOL N/A 09/19/2017   Procedure: ESOPHAGOGASTRODUODENOSCOPY (EGD) WITH PROPOFOL;  Surgeon: Virgel Manifold, MD;  Location: [facility] ENDOSCOPY;  Service: Endoscopy;  Laterality: N/A;   ESOPHAGOGASTRODUODENOSCOPY (EGD) WITH PROPOFOL N/A 10/03/2017   Procedure: ESOPHAGOGASTRODUODENOSCOPY (EGD) WITH PROPOFOL;  Surgeon: Virgel Manifold, MD;  Location: [facility] ENDOSCOPY;  Service: Endoscopy;  Laterality: N/A;   ESOPHAGOGASTRODUODENOSCOPY (EGD) WITH PROPOFOL N/A 10/22/2017   Procedure: ESOPHAGOGASTRODUODENOSCOPY (EGD) WITH PROPOFOL;  Surgeon: Virgel Manifold, MD;  Location: [facility] ENDOSCOPY;  Service: Endoscopy;  Laterality: N/A;   ESOPHAGOGASTRODUODENOSCOPY (EGD) WITH PROPOFOL N/A 11/18/2017   Procedure: ESOPHAGOGASTRODUODENOSCOPY (EGD) WITH PROPOFOL;  Surgeon: Virgel Manifold, MD;  Location: [facility] ENDOSCOPY;  Service: Endoscopy;  Laterality: N/A;   ESOPHAGOGASTRODUODENOSCOPY (EGD) WITH PROPOFOL N/A 12/17/2017   Procedure: ESOPHAGOGASTRODUODENOSCOPY (EGD) WITH PROPOFOL;  Surgeon: Virgel Manifold, MD;  Location: [facility] ENDOSCOPY;  Service: Endoscopy;  Laterality: N/A;   ESOPHAGOGASTRODUODENOSCOPY (EGD) WITH PROPOFOL N/A 01/08/2018   Procedure: ESOPHAGOGASTRODUODENOSCOPY (EGD) WITH PROPOFOL;  Surgeon: Virgel Manifold, MD;  Location: [facility] ENDOSCOPY;  Service: Endoscopy;  Laterality: N/A;   ESOPHAGOGASTRODUODENOSCOPY (EGD) WITH PROPOFOL N/A 01/22/2018   Procedure: ESOPHAGOGASTRODUODENOSCOPY (EGD) WITH PROPOFOL;  Surgeon: Virgel Manifold, MD;  Location[facility] ENDOSCOPY;  Service: Endoscopy;  Laterality: N/A;   ESOPHAGOGASTRODUODENOSCOPY (EGD) WITH PROPOFOL N/A 07/15/2018   Procedure: ESOPHAGOGASTRODUODENOSCOPY (EGD) WITH PROPOFOL;  Surgeon: Virgel Manifold, MD;  Location: [facility] ENDOSCOPY;  Service: Endoscopy;  Laterality: N/A;   EYE SURGERY     GASTRIC BYPASS     HERNIA REPAIR     HIATAL HERNIA REPAIR     JOINT REPLACEMENT Bilateral    knee   MELANOMA EXCISION     PORTA CATH INSERTION N/A 04/07/2017   Procedure: PORTA CATH INSERTION;  Surgeon: Algernon Huxley, MD;  Location: [place] CV LAB;  Service: Cardiovascular;  Laterality: N/A;   POSTERIOR CERVICAL FUSION/FORAMINOTOMY N/A 07/28/2013   Procedure: Cervical four-seven  posterior cervical fusion;  Surgeon: Erline Levine, MD;  Location: [place] NEURO ORS;   Service: Neurosurgery;  Laterality: N/A;   TONSILLECTOMY     TOTAL KNEE ARTHROPLASTY     Social History   Tobacco Use   Smoking status: Former Smoker    Packs/day: 1.00    Years: 20.00    Pack years: 20.00    Types: Cigarettes    Quit date: 24    Years since quitting: 43.6   Smokeless tobacco: Never Used  Substance Use Topics   Alcohol use: No   Drug use: Never   Family History  Problem Relation Age of Onset   Breast cancer Mother 34   Arthritis-Osteo Mother    Breast cancer Sister 36   Bladder Cancer Sister    Bladder Cancer Father    Tongue cancer Father    Congenital heart disease Father    Prostate cancer Brother    Uterine cancer Maternal Aunt    Lung cancer Paternal Uncle    Leukemia Maternal Grandmother    Liver cancer Paternal Grandmother    Colon cancer Neg Hx    Allergies  Allergen Reactions   Cefuroxime Nausea Only   Codeine Nausea Only   Doxycycline Other (See Comments)    Lip swelling   Gabapentin Other (See Comments)    Swelling.    Iohexol Itching    07-15-13 pt developed itching on fingers after contrast given. Dr. Irish Elders looked at pt and said to put in system as allergy. BB   Other Other (See Comments)    Contrast Dye- caused fever, chills, awful feeling Bandages - itching, rash   Oxycodone Other (See Comments)    nausea   Quinolones Swelling    Lip swelling   Synvisc [Hylan G-F 20] Swelling   Current Outpatient Medications on File Prior to Visit  Medication Sig Dispense Refill   albuterol (PROVENTIL HFA;VENTOLIN HFA) 108 (90 Base) MCG/ACT inhaler Inhale 2 puffs into the lungs every 4 (four) hours as needed for wheezing or shortness of breath.     aspirin EC 81 MG tablet Take 1 tablet (81 mg total) by mouth every other day.     calcium carbonate (TUMS EX) 750 MG chewable tablet Chew 1 tablet by mouth as needed for heartburn.      chlorhexidine (PERIDEX) 0.12 % solution Use as directed 15 mLs in the mouth or  throat 2 (two) times daily.     Cholecalciferol (VITAMIN D) 2000 UNITS CAPS Take 2,000 Units by mouth daily.     dexamethasone (DECADRON) 1 MG tablet Take 2 tablets (2 mg total) by mouth daily. 60 tablet 0   Diclofenac Sodium 1 % CREA Apply to left arm as needed for pain three times  daily 120 g 0   dronabinol (MARINOL) 5 MG capsule Take 1 capsule (5 mg total) by mouth 4 (four) times daily. 120 capsule 0   fluticasone (FLONASE) 50 MCG/ACT nasal spray Place 1-2 sprays into both nostrils daily. 16 g 5   furosemide (LASIX) 20 MG tablet TAKE 1-2 TABLETS BY MOUTH DAILY AS NEEDED FOR EDEMA 180 tablet 1   hydrocortisone cream 1 % Apply 1 application topically daily as needed for itching.      loratadine (CLARITIN) 10 MG tablet Take 10 mg by mouth daily.     Multiple Vitamins-Minerals (MULTIVITAMIN ADULT) CHEW Chew 1 tablet by mouth daily.      MYRBETRIQ 25 MG TB24 tablet TAKE ONE TABLET EVERY DAY 30 tablet 5   oxybutynin (DITROPAN) 5 MG tablet TAKE ONE TABLET BY MOUTH EVERY MORNING AND TAKE ONE-HALF TABLET AT BEDTIME 45 tablet 3   pantoprazole (PROTONIX) 40 MG tablet TAKE 1 TABLET BY MOUTH TWICE DAILY BEFORE A MEAL 60 tablet 3   Polyethyl Glycol-Propyl Glycol (SYSTANE OP) Apply 1 drop to eye daily as needed (dry eyes).     potassium chloride (KLOR-CON) 20 MEQ packet Take 20 mEq by mouth daily. 30 packet 0   pregabalin (LYRICA) 150 MG capsule TAKE 1 CAPSULE TWICE DAILY 60 capsule 3   prochlorperazine (COMPAZINE) 10 MG tablet TAKE ONE TABLET BY MOUTH EVERY 6 HOURS AS NEEDED FOR NAUSEA OR VOMITING 30 tablet 1   pyridOXINE (VITAMIN B-6) 100 MG tablet Take 1 tablet (100 mg total) by mouth daily. 30 tablet 3   SENNA-PLUS 8.6-50 MG tablet TAKE TWO TABLETS BY MOUTH EVERY DAY 60 tablet 0   sulfamethoxazole-trimethoprim (BACTRIM DS) 800-160 MG tablet Take 1 tablet by mouth 2 (two) times daily. 28 tablet 0   traMADol (ULTRAM) 50 MG tablet Take 50 mg by mouth every 6 (six) hours as needed.      traZODone (DESYREL) 50 MG tablet Take 0.5-1 tablets (25-50 mg total) by mouth at bedtime as needed for sleep.     trifluridine-tipiracil (LONSURF) 15-6.14 MG tablet Take 2 tablets by mouth two (2) times daily after a meal. Take with two 20mg  tablets. Take only on days 1-5, 8-12. Repeat every 28days. 40 tablet 3   trifluridine-tipiracil (LONSURF) 20-8.19 MG tablet Take 2 tablets by mouth two (2) times daily after a meal. Take with two 15mg  tablets. Take only on days 1-5, 8-12. Repeat every 28days. 40 tablet 3   Current Facility-Administered Medications on File Prior to Visit  Medication Dose Route Frequency Provider Last Rate Last Dose   heparin lock flush 100 unit/mL  500 Units Intravenous Once Earlie Server, MD       sodium chloride flush (NS) 0.9 % injection 10 mL  10 mL Intravenous PRN Earlie Server, MD   10 mL at 01/26/18 0904   sodium chloride flush (NS) 0.9 % injection 10 mL  10 mL Intravenous Once Earlie Server, MD        Review of Systems  Constitutional: Positive for fatigue. Negative for activity change, appetite change, fever and unexpected weight change.  HENT: Negative for congestion, ear pain, rhinorrhea, sinus pressure and sore throat.   Eyes: Negative for pain, redness and visual disturbance.  Respiratory: Negative for cough, shortness of breath and wheezing.   Cardiovascular: Positive for leg swelling. Negative for chest pain and palpitations.  Gastrointestinal: Negative for abdominal pain, blood in stool, constipation and diarrhea.  Endocrine: Negative for polydipsia and polyuria.  Genitourinary: Negative for dysuria, frequency and  urgency.  Musculoskeletal: Negative for arthralgias, back pain and myalgias.  Skin: Positive for color change and wound. Negative for pallor and rash.       Toes are somewhat blue on the underside   Allergic/Immunologic: Negative for environmental allergies.  Neurological: Negative for dizziness, syncope and headaches.  Hematological: Negative for  adenopathy. Does not bruise/bleed easily.  Psychiatric/Behavioral: Negative for decreased concentration and dysphoric mood. The patient is not nervous/anxious.        Per hx-she confuses easily       Objective:   Physical Exam Constitutional:      General: She is not in acute distress.    Appearance: Normal appearance. She is obese. She is not ill-appearing, toxic-appearing or diaphoretic.     Comments: Frail but not ill appearing   HENT:     Head: Normocephalic and atraumatic.     Mouth/Throat:     Mouth: Mucous membranes are moist.     Pharynx: Oropharynx is clear.  Eyes:     General: No scleral icterus.       Right eye: No discharge.        Left eye: No discharge.     Extraocular Movements: Extraocular movements intact.     Conjunctiva/sclera: Conjunctivae normal.     Pupils: Pupils are equal, round, and reactive to light.  Neck:     Musculoskeletal: Neck supple. No neck rigidity or muscular tenderness.     Vascular: No carotid bruit.  Cardiovascular:     Rate and Rhythm: Normal rate and regular rhythm.     Heart sounds: Murmur present.     Comments: Cannot palpate pedal pulses due to edema   Underside of toes -skin is dusky in color/cool to the touch  Nl capillary refill however  Nl mobility of feet- also able to walk   Pulmonary:     Effort: Pulmonary effort is normal. No respiratory distress.     Breath sounds: Normal breath sounds. No wheezing or rales.     Comments: Good air exch Musculoskeletal:     Right lower leg: Edema present.     Left lower leg: Edema present.     Comments: 1-2 plus pitting edema in both feet and ankles (could not palpate pulses due to edema) No tenderness     Lymphadenopathy:     Cervical: No cervical adenopathy.  Skin:    General: Skin is warm and dry.     Findings: Erythema present.     Comments: Several patches of erythematous skin on both lower legs-warm to the touch  Some weeping of clear fluid on the L  No bleeding Several  healed abrasions on both legs   Toes are cool  Underside of all toes is red to purple in color (resembling raynauds)   Neurological:     Mental Status: She is alert.     Motor: Weakness present.  Psychiatric:        Mood and Affect: Mood normal.           Assessment & Plan:   Problem List Items Addressed This Visit      Other   Cellulitis    Patches of erythematous warm skin superimposed on mod to severe pedal edema (some weeping of clear fluid)  She is intol to supp hose/recommend elevation  tx with augmentin (already on bactrim ds )  Close f/u  Will d/w her pcp She has oncology f/u in 2 days as well  inst to update if symptoms worsen (  redness/swelling/ or discoloration of toes       Pedal edema - Primary    Moderate to severe pedal edema (? New) w/o sob  Some patches of erythema worrisome for cellulitis (also weeping)  Pt has h/o aortic stenosis but did not see CHF  Unsure if this could be side eff of oncology medicines or if related to liver lesion from esophageal cancer  Enc her strongly to start back on lasix 20 mg 2 pills daily and elevate feet /avoid excess sodium  tx the possible cellulitis with augmentin (she is already on bactrim for uti)  The color /temp change in toes warrants further eval if no improvement (resembled raynaud's)  Give her neuropathy -she cannot feel this  Will d/w her PCP Also she will see oncology on 8/6 as planned       Blue toes    Dusky skin changes on underside of all toes (symmetric) in cancer pt under palliative care  In conjunction with pedal edema and early cellulitis  Pt take 81 mg asa  Unable to palp pulses due to edema  No h/o PVD (does have aortic stenosis)  Not a classic presentation of acute limb ischemia and no h/o pain or claudication  Will d/w pcp

## 2018-12-14 NOTE — Patient Instructions (Signed)
When you sit=elevate legs the best you can  Take augmentin as directed Follow up with oncology on the 6th as planned   Take lasix 2 pills daily for now  Avoid extra salt   If anything worsens let us know

## 2018-12-14 NOTE — Assessment & Plan Note (Signed)
Moderate to severe pedal edema (? New) w/o sob  Some patches of erythema worrisome for cellulitis (also weeping)  Pt has h/o aortic stenosis but did not see CHF  Unsure if this could be side eff of oncology medicines or if related to liver lesion from esophageal cancer  Enc her strongly to start back on lasix 20 mg 2 pills daily and elevate feet /avoid excess sodium  tx the possible cellulitis with augmentin (she is already on bactrim for uti)  The color /temp change in toes warrants further eval if no improvement (resembled raynaud's)  Give her neuropathy -she cannot feel this  Will d/w her PCP Also she will see oncology on 8/6 as planned

## 2018-12-14 NOTE — Telephone Encounter (Signed)
I will see her then  Cc to PCP

## 2018-12-14 NOTE — Assessment & Plan Note (Signed)
Patches of erythematous warm skin superimposed on mod to severe pedal edema (some weeping of clear fluid)  She is intol to supp hose/recommend elevation  tx with augmentin (already on bactrim ds )  Close f/u  Will d/w her pcp She has oncology f/u in 2 days as well  inst to update if symptoms worsen (redness/swelling/ or discoloration of toes

## 2018-12-15 ENCOUNTER — Telehealth: Payer: Self-pay | Admitting: Family Medicine

## 2018-12-15 NOTE — Telephone Encounter (Signed)
Spoke with patient, states that her legs seem to be some better today, still pretty swollen. Legs feel hot, some redness. Some clear secretions around ankles, more swelling in this area. No pain or discomfort when bearing weight. Pt states that yesterday some pain but not much today. Denies fever/chills.   Legs are not dressed, able to breathe Pt is keeping legs elevated, sitting in recliner.  Pt experiencing more pain in waist and upper back - Pt states that she took some Tramadol last night for back pain.

## 2018-12-15 NOTE — Telephone Encounter (Signed)
Since she is some better, then I would try to keep feet elevated and continue as is on the abx.  Please update me about her situation Thursday, sooner if needed/if worse.  Thanks.

## 2018-12-15 NOTE — Telephone Encounter (Signed)
I talked with Dr. Glori Bickers about OV yesterday.  Please get update on patient.  How are her legs (swelling/coloration)?  Does she have a fever?  Any new changes?  Thanks.

## 2018-12-16 NOTE — Telephone Encounter (Signed)
Patient advised as below.  

## 2018-12-17 ENCOUNTER — Other Ambulatory Visit: Payer: Self-pay

## 2018-12-17 ENCOUNTER — Other Ambulatory Visit: Payer: Medicare Other

## 2018-12-17 ENCOUNTER — Ambulatory Visit
Admission: RE | Admit: 2018-12-17 | Discharge: 2018-12-17 | Disposition: A | Discharge status: Home / Self Care | Payer: Medicare Other | Source: Ambulatory Visit | Attending: Oncology | Admitting: Oncology

## 2018-12-17 DIAGNOSIS — C158 Malignant neoplasm of overlapping sites of esophagus: Secondary | ICD-10-CM | POA: Diagnosis not present

## 2018-12-17 DIAGNOSIS — R410 Disorientation, unspecified: Secondary | ICD-10-CM | POA: Diagnosis present

## 2018-12-17 MED ORDER — GADOBUTROL 1 MMOL/ML IV SOLN
7.5000 mL | Freq: Once | INTRAVENOUS | Status: AC | PRN
  Administered 2018-12-17: 7.5 mL via INTRAVENOUS

## 2018-12-18 ENCOUNTER — Other Ambulatory Visit: Payer: Self-pay | Admitting: Oncology

## 2018-12-18 ENCOUNTER — Encounter: Payer: Self-pay | Admitting: Oncology

## 2018-12-24 ENCOUNTER — Other Ambulatory Visit: Payer: Self-pay | Admitting: Oncology

## 2018-12-24 ENCOUNTER — Other Ambulatory Visit: Payer: Self-pay | Admitting: Family Medicine

## 2018-12-24 NOTE — Telephone Encounter (Signed)
LOV 12/14/2018

## 2018-12-25 ENCOUNTER — Encounter: Payer: Self-pay | Admitting: Oncology

## 2018-12-25 ENCOUNTER — Inpatient Hospital Stay (HOSPITAL_BASED_OUTPATIENT_CLINIC_OR_DEPARTMENT_OTHER): Payer: Medicare Other | Admitting: Hospice and Palliative Medicine

## 2018-12-25 ENCOUNTER — Other Ambulatory Visit: Payer: Self-pay

## 2018-12-25 ENCOUNTER — Inpatient Hospital Stay: Payer: Medicare Other | Attending: Oncology | Admitting: Oncology

## 2018-12-25 VITALS — BP 100/60 | HR 72 | Temp 98.4°F

## 2018-12-25 DIAGNOSIS — Z923 Personal history of irradiation: Secondary | ICD-10-CM | POA: Diagnosis not present

## 2018-12-25 DIAGNOSIS — C158 Malignant neoplasm of overlapping sites of esophagus: Secondary | ICD-10-CM

## 2018-12-25 DIAGNOSIS — Z9884 Bariatric surgery status: Secondary | ICD-10-CM | POA: Insufficient documentation

## 2018-12-25 DIAGNOSIS — G893 Neoplasm related pain (acute) (chronic): Secondary | ICD-10-CM | POA: Insufficient documentation

## 2018-12-25 DIAGNOSIS — G4733 Obstructive sleep apnea (adult) (pediatric): Secondary | ICD-10-CM | POA: Diagnosis not present

## 2018-12-25 DIAGNOSIS — Z87891 Personal history of nicotine dependence: Secondary | ICD-10-CM | POA: Insufficient documentation

## 2018-12-25 DIAGNOSIS — C159 Malignant neoplasm of esophagus, unspecified: Secondary | ICD-10-CM

## 2018-12-25 DIAGNOSIS — R531 Weakness: Secondary | ICD-10-CM

## 2018-12-25 DIAGNOSIS — Z7189 Other specified counseling: Secondary | ICD-10-CM | POA: Diagnosis not present

## 2018-12-25 DIAGNOSIS — Z9221 Personal history of antineoplastic chemotherapy: Secondary | ICD-10-CM | POA: Insufficient documentation

## 2018-12-25 DIAGNOSIS — C787 Secondary malignant neoplasm of liver and intrahepatic bile duct: Secondary | ICD-10-CM | POA: Insufficient documentation

## 2018-12-25 DIAGNOSIS — Z515 Encounter for palliative care: Secondary | ICD-10-CM | POA: Diagnosis not present

## 2018-12-25 DIAGNOSIS — R600 Localized edema: Secondary | ICD-10-CM | POA: Diagnosis not present

## 2018-12-25 DIAGNOSIS — Z803 Family history of malignant neoplasm of breast: Secondary | ICD-10-CM | POA: Diagnosis not present

## 2018-12-25 DIAGNOSIS — Z79899 Other long term (current) drug therapy: Secondary | ICD-10-CM | POA: Diagnosis not present

## 2018-12-25 DIAGNOSIS — Z85820 Personal history of malignant melanoma of skin: Secondary | ICD-10-CM | POA: Diagnosis not present

## 2018-12-25 DIAGNOSIS — E43 Unspecified severe protein-calorie malnutrition: Secondary | ICD-10-CM | POA: Diagnosis not present

## 2018-12-25 DIAGNOSIS — J449 Chronic obstructive pulmonary disease, unspecified: Secondary | ICD-10-CM | POA: Insufficient documentation

## 2018-12-25 DIAGNOSIS — Z7982 Long term (current) use of aspirin: Secondary | ICD-10-CM | POA: Insufficient documentation

## 2018-12-25 DIAGNOSIS — I10 Essential (primary) hypertension: Secondary | ICD-10-CM | POA: Diagnosis not present

## 2018-12-25 DIAGNOSIS — Z452 Encounter for adjustment and management of vascular access device: Secondary | ICD-10-CM | POA: Insufficient documentation

## 2018-12-25 MED ORDER — FENTANYL 12 MCG/HR TD PT72: 1.0000 | MEDICATED_PATCH | TRANSDERMAL | 0 refills | Status: AC

## 2018-12-25 NOTE — Progress Notes (Signed)
[place]  Telephone:([phone] [phone]   Name: [name] Date: 12/25/2018 MRN: [id number]  DOB: 1935-02-07  Patient Care Team: Tonia Ghent, MD as PCP - General (Family Medicine)    REASON FOR CONSULTATION: Palliative Care consult requested for this 84 y.o. female with multiple medical problems including stage IV esophageal cancer s/p chemotherapy, immunotherapy, and XRT with disease progression most recently on Keytruda. 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 Patient also has recurrent UTIs and was last hospitalized  04/12/2018  to 04/14/18 with same. Palliative care was consulted to help address goals.   SOCIAL HISTORY:     reports that she quit smoking about 43 years ago. Her smoking use included cigarettes. She has a 20.00 pack-year smoking history. She has never used smokeless tobacco. She reports that she does not drink alcohol or use drugs.   Patient is widowed.  She lives at home alone.  She has 2 sons, 1 of whom lives in [place] and the other in [place].  Patient is a retired [employer] account executive.  ADVANCE DIRECTIVES:  Patient's son, Yvone Neu, is her healthcare power of attorney.  CODE STATUS: DNR (MOST form completed on 10/23/18)  PAST MEDICAL HISTORY: Past Medical History:  Diagnosis Date   Arthritis    Asthma    COPD (chronic obstructive pulmonary disease) (HCC)    Dysphonia    Dyspnea    Esophageal cancer (HCC)    GERD (gastroesophageal reflux disease)    Heart murmur    History of kidney stones    Hypertension    Hypokalemia 10/07/2017   Melanoma (HCC)    OAB (overactive bladder)    OSA on CPAP    Paresthesia    from neck down after spinal abscess   Personal history of chemotherapy    esophageal cancer   Personal history of radiation therapy    esophageal cancer   Pupil asymmetry    R pupil defect   Skin cancer    Sleep apnea    Spinal cord abscess     PAST SURGICAL HISTORY:  Past Surgical History:  Procedure Laterality Date   ABDOMINAL HYSTERECTOMY     ANTERIOR CERVICAL DECOMP/DISCECTOMY FUSION N/A 07/16/2013   Procedure: ANTERIOR CERVICAL DECOMPRESSION/DISCECTOMY FUSION mutlipleLEVELS C4-7;  Surgeon: Erline Levine, MD;  Location: [facility];  Service: Neurosurgery;  Laterality: N/A;   APPENDECTOMY     carpel tunn Bilateral    CATARACT EXTRACTION     CATARACT EXTRACTION, BILATERAL     CHOLECYSTECTOMY     dental implant     ESOPHAGOGASTRODUODENOSCOPY Left 03/21/2017   Procedure: ESOPHAGOGASTRODUODENOSCOPY (EGD);  Surgeon: Virgel Manifold, MD;  Location: [facility];  Service: Endoscopy;  Laterality: Left;   ESOPHAGOGASTRODUODENOSCOPY (EGD) WITH PROPOFOL N/A 08/20/2017   Procedure: ESOPHAGOGASTRODUODENOSCOPY (EGD) WITH PROPOFOL;  Surgeon: Virgel Manifold, MD;  Location: [facility] END[facility]COPY;  Service: Endoscopy;  Laterality: N/A;   ESOPHAGOGASTRODUODENOSCOPY (EGD) WITH PROPOFOL N/A 09/19/2017   Procedure: ESOPHAGOGASTRODUODENOSCOPY (EGD) WITH PROPOFOL;  Surgeon: Virgel Manifold, MD;  Location: [facility];  Service: Endoscopy;  Laterality: N/A;   ESOPHAGOGASTRODUODENOSCOPY (EGD) WITH PROPOFOL N/A 10/03/2017   Procedure: ESOPHAGOGASTRODUODENOSCOPY (EGD) WITH PROPOFOL;  Surgeon: Virgel Manifold, MD;  Location: [facility] END[facility]COPY;  Service: Endoscopy;  Laterality: N/A;   ESOPHAGOGASTRODUODENOSCOPY (EGD) WITH PROPOFOL N/A 10/22/2017   Procedure: ESOPHAGOGASTRODUODENOSCOPY (EGD) WITH PROPOFOL;  Surgeon: Virgel Manifold, MD;  Location: [facility]COPY;  Service: Endoscopy;  Laterality: N/A;   ESOPHAGOGASTRODUODENOSCOPY (EGD) WITH PROPOFOL N/A 11/18/2017   Procedure: ESOPHAGOGASTRODUODENOSCOPY (EGD) WITH PROPOFOL;  Surgeon: Virgel Manifold, MD;  Location: [facility];  Service: Endoscopy;  Laterality: N/A;   ESOPHAGOGASTRODUODENOSCOPY  (EGD) WITH PROPOFOL N/A 12/17/2017   Procedure: ESOPHAGOGASTRODUODENOSCOPY (EGD) WITH PROPOFOL;  Surgeon: Virgel Manifold, MD;  Location: [facility];  Service: Endoscopy;  Laterality: N/A;   ESOPHAGOGASTRODUODENOSCOPY (EGD) WITH PROPOFOL N/A 01/08/2018   Procedure: ESOPHAGOGASTRODUODENOSCOPY (EGD) WITH PROPOFOL;  Surgeon: Virgel Manifold, MD;  Location: [facility];  Service: Endoscopy;  Laterality: N/A;   ESOPHAGOGASTRODUODENOSCOPY (EGD) WITH PROPOFOL N/A 01/22/2018   Procedure: ESOPHAGOGASTRODUODENOSCOPY (EGD) WITH PROPOFOL;  Surgeon: Virgel Manifold, MD;  Location: [facility];  Service: Endoscopy;  Laterality: N/A;   ESOPHAGOGASTRODUODENOSCOPY (EGD) WITH PROPOFOL N/A 07/15/2018   Procedure: ESOPHAGOGASTRODUODENOSCOPY (EGD) WITH PROPOFOL;  Surgeon: Virgel Manifold, MD;  Location: [facility];  Service: Endoscopy;  Laterality: N/A;   EYE SURGERY     GASTRIC BYPASS     HERNIA REPAIR     HIATAL HERNIA REPAIR     JOINT REPLACEMENT Bilateral    knee   MELANOMA EXCISION     PORTA CATH INSERTION N/A 04/07/2017   Procedure: PORTA CATH INSERTION;  Surgeon: Algernon Huxley, MD;  Location: [facility];  Service: Cardiovascular;  Laterality: N/A;   POSTERIOR CERVICAL FUSION/FORAMINOTOMY N/A 07/28/2013   Procedure: Cervical four-seven  posterior cervical fusion;  Surgeon: Erline Levine, MD;  Location: [facility];  Service: Neurosurgery;  Laterality: N/A;   TONSILLECTOMY     TOTAL KNEE ARTHROPLASTY      HEMATOLOGY/ONCOLOGY HISTORY:  Oncology History Overview Note  Patient is a 84 year old female who has metastatic esophageal cancer with liver involvement.  Weight loss 20 pounds for the past year prior to diagnosis.  #EGD during admission showed partially obstructive esophageal mass at the distal part of esophagus. Biopsy was taken. Pathology showed esophageal adenocarcinoma. #US biopsy of liver lesion to proof distant metastasis.  Report family history  of breast cancer,but she has been having routine mammograms.  # Lives alone.She's a former smoker.  # Molecular Testing:  #[employer] Cdx: No reportable alternations with companion diagnostic (CDx) claims.  MS stable Tumor mutation burden: Cannot be determined, CCND3 amplification: Equivocal.  Amended reports on 06/04/2017, Tumor mutational burden changed from can not be determined to "TMB -low" on the re-analyzed samples.  # HER 2 negative.   Current Treatment S/p 6 cycles of FOLFOX, PET scan showed excellent partial response from both chemotherapy and radiation, result was discussed with patient.  currently on 5-FU maintenance.  S/p palliative radiation in January 2019 # Developed esophageal stricture secondary to radiation in April and got dilation via endoscopy. EGD shoed Benign-appearing esophageal stenosis. This is a result of radiation therapy.- LA Grade C radiation esophagitis. - The previously noted esophageal mass in Nov 2018 was much decreased indicating good response to radiation/chemotherapy. - Normal stomach.- Normal examined jejunum.- No specimens collected.      Malignant neoplasm of lower third of esophagus (HCC)  03/29/2017 Initial Diagnosis   Malignant neoplasm of lower third of esophagus ([place])   01/27/2018 - 05/24/2018 Chemotherapy   The patient had PACLitaxel (TAXOL) 162 mg in sodium chloride 0.9 % 250 mL chemo infusion (</= 67m/m2), 80 mg/m2 = 162 mg, Intravenous,  Once, 4 of 7 cycles Dose modification: 65 mg/m2 (original dose 80 mg/m2, Cycle 2, Reason: Provider Judgment), 70 mg/m2 (original dose 80 mg/m2, Cycle  4, Reason: Provider Judgment), 70 mg/m2 (original dose 80 mg/m2, Cycle 4, Reason: Provider Judgment) Administration: 162 mg (01/27/2018), 162 mg (03/02/2018), 132 mg (03/09/2018), 162 mg (02/03/2018), 162 mg (02/09/2018), 162 mg (02/23/2018), 162 mg (03/23/2018), 162 mg (03/30/2018), 162 mg (04/06/2018), 138 mg (04/27/2018), 138 mg (05/04/2018), 138 mg  (05/11/2018) ramucirumab (CYRAMZA) 700 mg in sodium chloride 0.9 % 180 mL chemo infusion, 8 mg/kg = 700 mg, Intravenous, Once, 4 of 7 cycles Administration: 700 mg (01/27/2018), 700 mg (03/09/2018), 700 mg (02/09/2018), 700 mg (02/23/2018), 700 mg (03/23/2018), 700 mg (04/06/2018), 700 mg (04/27/2018), 700 mg (05/11/2018)  for chemotherapy treatment.    08/24/2018 -  Chemotherapy   The patient had [No matching medication found in this treatment plan]  for chemotherapy treatment.    Esophageal adenocarcinoma ([place])   Initial Diagnosis   Esophageal adenocarcinoma ([place])   05/27/2018 - 08/18/2018 Chemotherapy   The patient had pembrolizumab (KEYTRUDA) 200 mg in sodium chloride 0.9 % 50 mL chemo infusion, 200 mg, Intravenous, Once, 4 of 5 cycles Administration: 200 mg (05/27/2018), 200 mg (06/17/2018), 200 mg (07/08/2018), 200 mg (07/29/2018)  for chemotherapy treatment.    08/24/2018 -  Chemotherapy   The patient had [No matching medication found in this treatment plan]  for chemotherapy treatment.      ALLERGIES:  is allergic to cefuroxime; codeine; doxycycline; gabapentin; iohexol; other; oxycodone; quinolones; and synvisc [hylan g-f 20].  MEDICATIONS:  Current Outpatient Medications  Medication Sig Dispense Refill   albuterol (PROVENTIL HFA;VENTOLIN HFA) 108 (90 Base) MCG/ACT inhaler Inhale 2 puffs into the lungs every 4 (four) hours as needed for wheezing or shortness of breath.     amoxicillin-clavulanate (AUGMENTIN) 875-125 MG tablet Take 1 tablet by mouth 2 (two) times daily. 14 tablet 0   aspirin EC 81 MG tablet Take 1 tablet (81 mg total) by mouth every other day.     calcium carbonate (TUMS EX) 750 MG chewable tablet Chew 1 tablet by mouth as needed for heartburn.      chlorhexidine (PERIDEX) 0.12 % solution Use as directed 15 mLs in the mouth or throat 2 (two) times daily.     Cholecalciferol (VITAMIN D) 2000 UNITS CAPS Take 2,000 Units by mouth daily.     dexamethasone (DECADRON)  1 MG tablet Take 2 tablets (2 mg total) by mouth daily. 60 tablet 0   Diclofenac Sodium 1 % CREA Apply to left arm as needed for pain three times daily 120 g 0   dronabinol (MARINOL) 5 MG capsule Take 1 capsule (5 mg total) by mouth 4 (four) times daily. 120 capsule 0   fentaNYL (DURAGESIC) 12 MCG/HR Place 1 patch onto the skin every 3 (three) days for 7 doses. 10 patch 0   fluticasone (FLONASE) 50 MCG/ACT nasal spray Place 1-2 sprays into both nostrils daily. 16 g 5   furosemide (LASIX) 20 MG tablet TAKE 1-2 TABLETS BY MOUTH DAILY AS NEEDED FOR EDEMA 180 tablet 1   hydrocortisone cream 1 % Apply 1 application topically daily as needed for itching.      loratadine (CLARITIN) 10 MG tablet Take 10 mg by mouth daily.     Multiple Vitamins-Minerals (MULTIVITAMIN ADULT) CHEW Chew 1 tablet by mouth daily.      MYRBETRIQ 25 MG TB24 tablet TAKE ONE TABLET EVERY DAY 30 tablet 5   oxybutynin (DITROPAN) 5 MG tablet TAKE ONE TABLET BY MOUTH EVERY MORNING AND TAKE ONE-HALF TABLET AT BEDTIME 45 tablet 3   pantoprazole (PROTONIX) 40 MG tablet  TAKE 1 TABLET BY MOUTH TWICE DAILY BEFORE A MEAL 60 tablet 3   Polyethyl Glycol-Propyl Glycol (SYSTANE OP) Apply 1 drop to eye daily as needed (dry eyes).     potassium chloride (KLOR-CON) 20 MEQ packet Take 20 mEq by mouth daily. 30 packet 0   pregabalin (LYRICA) 150 MG capsule TAKE 1 CAPSULE TWICE DAILY 60 capsule 3   prochlorperazine (COMPAZINE) 10 MG tablet TAKE ONE TABLET BY MOUTH EVERY 6 HOURS AS NEEDED FOR NAUSEA OR VOMITING 30 tablet 1   pyridOXINE (VITAMIN B-6) 100 MG tablet Take 1 tablet (100 mg total) by mouth daily. 30 tablet 3   SENNA-PLUS 8.6-50 MG tablet TAKE TWO TABLETS BY MOUTH EVERY DAY 60 tablet 0   sulfamethoxazole-trimethoprim (BACTRIM DS) 800-160 MG tablet Take 1 tablet by mouth 2 (two) times daily. 28 tablet 0   traMADol (ULTRAM) 50 MG tablet Take 50 mg by mouth every 6 (six) hours as needed.     traZODone (DESYREL) 50 MG tablet  Take 0.5-1 tablets (25-50 mg total) by mouth at bedtime as needed for sleep.     trifluridine-tipiracil (LONSURF) 15-6.14 MG tablet Take 2 tablets by mouth two (2) times daily after a meal. Take with two 6m tablets. Take only on days 1-5, 8-12. Repeat every 28days. 40 tablet 3   trifluridine-tipiracil (LONSURF) 20-8.19 MG tablet Take 2 tablets by mouth two (2) times daily after a meal. Take with two 145mtablets. Take only on days 1-5, 8-12. Repeat every 28days. 40 tablet 3   No current facility-administered medications for this visit.    Facility-Administered Medications Ordered in Other Visits  Medication Dose Route Frequency Provider Last Rate Last Dose   heparin lock flush 100 unit/mL  500 Units Intravenous Once YuEarlie ServerMD       sodium chloride flush (NS) 0.9 % injection 10 mL  10 mL Intravenous PRN YuEarlie ServerMD   10 mL at 01/26/18 0904   sodium chloride flush (NS) 0.9 % injection 10 mL  10 mL Intravenous Once YuEarlie ServerMD        VITAL SIGNS: There were no vitals taken for this visit. There were no vitals filed for this visit.  Estimated body mass index is 32.92 kg/m as calculated from the following:   Height as of 12/14/18: _0  (1.575 m).   Weight as of 12/14/18: 180 lb (81.6 kg).  LABS: CBC:    Component Value Date/Time   WBC 6.6 12/08/2018 1105   HGB 7.7 (L) 12/08/2018 1105   HGB 12.9 07/13/2013 0609   HCT 24.8 (L) 12/08/2018 1105   HCT 38.8 07/13/2013 0609   PLT 173 12/08/2018 1105   PLT 212 07/13/2013 0609   MCV 101.2 (H) 12/08/2018 1105   MCV 93 07/13/2013 0609   NEUTROABS 5.9 12/08/2018 1105   LYMPHSABS 0.3 (L) 12/08/2018 1105   MONOABS 0.3 12/08/2018 1105   EOSABS 0.0 12/08/2018 1105   BASOSABS 0.0 12/08/2018 1105   Comprehensive Metabolic Panel:    Component Value Date/Time   NA 131 (L) 12/08/2018 1105   NA 138 07/13/2013 0609   K 5.2 (H) 12/08/2018 1105   K 4.1 07/13/2013 0609   CL 99 12/08/2018 1105   CL 104 07/13/2013 0609   CO2 23 12/08/2018  1105   CO2 29 07/13/2013 0609   BUN 34 (H) 12/08/2018 1105   BUN 15 07/13/2013 0609   CREATININE 1.25 (H) 12/08/2018 1105   CREATININE 0.67 07/13/2013 0609   GLUCOSE 101 (H)  12/08/2018 1105   GLUCOSE 115 (H) 07/13/2013 0609   CALCIUM 8.4 (L) 12/08/2018 1105   CALCIUM 8.3 (L) 07/13/2013 0609   AST 73 (H) 12/08/2018 1105   ALT 32 12/08/2018 1105   ALKPHOS 167 (H) 12/08/2018 1105   BILITOT 0.9 12/08/2018 1105   PROT 6.6 12/08/2018 1105   ALBUMIN 2.9 (L) 12/08/2018 1105    RADIOGRAPHIC STUDIES: Ct Abdomen Pelvis Wo Contrast  Result Date: 12/04/2018 CLINICAL DATA:  Esophageal cancer with weight loss and abdominal pain. EXAM: CT CHEST, ABDOMEN AND PELVIS WITHOUT CONTRAST TECHNIQUE: Multidetector CT imaging of the chest, abdomen and pelvis was performed following the standard protocol without IV contrast. COMPARISON:  08/18/2018 FINDINGS: CT CHEST FINDINGS Cardiovascular: Heart is enlarged. Coronary artery calcification is evident. Atherosclerotic calcification is noted in the wall of the thoracic aorta. Right Port-A-Cath tip is positioned in the distal SVC. Mediastinum/Nodes: No mediastinal lymphadenopathy. No evidence for gross hilar lymphadenopathy although assessment is limited by the lack of intravenous contrast on today's study. The esophagus has normal imaging features. There is no axillary lymphadenopathy. Lungs/Pleura: Areas of atelectasis and/or scarring noted in the lungs bilaterally. No suspicious pulmonary nodule or mass. Tiny bilateral pleural effusions evident. Musculoskeletal: No worrisome lytic or sclerotic osseous abnormality. CT ABDOMEN PELVIS FINDINGS Hepatobiliary: Liver enlargement displaces adjacent anatomy out of the right upper quadrant abdomen. Continued interval progression of liver metastases. The lateral segment left liver lesion measures 6.4 x 5.7 cm today compared to 5.3 x 4.9 cm previously. Conglomeration of metastases measured in the mid liver previously at 8.6 x 12.7  cm is now 10.9 x 14.4 cm. Discrete lesion in the inferior liver measures 8.8 x 8.3 cm today on 64/2. The same lesion was 8.2 x 8.0 cm previously. Gallbladder is surgically absent. No intrahepatic or extrahepatic biliary dilation. Pancreas: No focal mass lesion. No dilatation of the main duct. No intraparenchymal cyst. No peripancreatic edema. Spleen: No splenomegaly. No focal mass lesion. Adrenals/Urinary Tract: No adrenal nodule or mass. Stable appearance staghorn calculus left kidney. Right kidney unremarkable. No hydroureteronephrosis. Bladder unremarkable. Stomach/Bowel: Status post gastric bypass. No small bowel wall thickening. No small bowel dilatation. The terminal ileum is normal. The appendix is not visualized, but there is no edema or inflammation in the region of the cecum. No gross colonic mass. No colonic wall thickening. Vascular/Lymphatic: There is abdominal aortic atherosclerosis without aneurysm. Gastrohepatic ligament lymph node has progressed in the interval measuring 4.2 x 2.9 cm today compared to 3.1 x 2.0 cm previously. There is probably a 2.9 x 1.7 cm lymph node in the hepato duodenal ligament on 56/2 although this is difficult to assess given the lack of intravenous contrast material. Reproductive: The uterus is surgically absent. There is no adnexal mass. Other: Fluid seen around the liver on the prior study has resolved. Musculoskeletal: No worrisome lytic or sclerotic osseous abnormality. Degenerative changes noted lumbar spine. Stable mild skin thickening lower right anterior abdominal wall. IMPRESSION: 1. Continued further progression of the multiple hepatic metastases. 2. Bulky gastrohepatic ligament lymphadenopathy has progressed in there appears to be a new enlarged lymph node in the hepato duodenal ligament on today's exam. 3. Distal esophageal wall thickening seen previously is less prominent today. 4. Nonobstructing staghorn calculus left kidney, similar to prior. 5. Stable  appearance mild skin thickening lower anterior right abdominal wall. 6.  Aortic Atherosclerois (ICD10-170.0) Electronically Signed   By: Misty Stanley M.D.   On: 12/04/2018 12:19   Ct Chest Wo Contrast  Result Date: 12/04/2018  CLINICAL DATA:  Esophageal cancer with weight loss and abdominal pain. EXAM: CT CHEST, ABDOMEN AND PELVIS WITHOUT CONTRAST TECHNIQUE: Multidetector CT imaging of the chest, abdomen and pelvis was performed following the standard protocol without IV contrast. COMPARISON:  08/18/2018 FINDINGS: CT CHEST FINDINGS Cardiovascular: Heart is enlarged. Coronary artery calcification is evident. Atherosclerotic calcification is noted in the wall of the thoracic aorta. Right Port-A-Cath tip is positioned in the distal SVC. Mediastinum/Nodes: No mediastinal lymphadenopathy. No evidence for gross hilar lymphadenopathy although assessment is limited by the lack of intravenous contrast on today's study. The esophagus has normal imaging features. There is no axillary lymphadenopathy. Lungs/Pleura: Areas of atelectasis and/or scarring noted in the lungs bilaterally. No suspicious pulmonary nodule or mass. Tiny bilateral pleural effusions evident. Musculoskeletal: No worrisome lytic or sclerotic osseous abnormality. CT ABDOMEN PELVIS FINDINGS Hepatobiliary: Liver enlargement displaces adjacent anatomy out of the right upper quadrant abdomen. Continued interval progression of liver metastases. The lateral segment left liver lesion measures 6.4 x 5.7 cm today compared to 5.3 x 4.9 cm previously. Conglomeration of metastases measured in the mid liver previously at 8.6 x 12.7 cm is now 10.9 x 14.4 cm. Discrete lesion in the inferior liver measures 8.8 x 8.3 cm today on 64/2. The same lesion was 8.2 x 8.0 cm previously. Gallbladder is surgically absent. No intrahepatic or extrahepatic biliary dilation. Pancreas: No focal mass lesion. No dilatation of the main duct. No intraparenchymal cyst. No peripancreatic  edema. Spleen: No splenomegaly. No focal mass lesion. Adrenals/Urinary Tract: No adrenal nodule or mass. Stable appearance staghorn calculus left kidney. Right kidney unremarkable. No hydroureteronephrosis. Bladder unremarkable. Stomach/Bowel: Status post gastric bypass. No small bowel wall thickening. No small bowel dilatation. The terminal ileum is normal. The appendix is not visualized, but there is no edema or inflammation in the region of the cecum. No gross colonic mass. No colonic wall thickening. Vascular/Lymphatic: There is abdominal aortic atherosclerosis without aneurysm. Gastrohepatic ligament lymph node has progressed in the interval measuring 4.2 x 2.9 cm today compared to 3.1 x 2.0 cm previously. There is probably a 2.9 x 1.7 cm lymph node in the hepato duodenal ligament on 56/2 although this is difficult to assess given the lack of intravenous contrast material. Reproductive: The uterus is surgically absent. There is no adnexal mass. Other: Fluid seen around the liver on the prior study has resolved. Musculoskeletal: No worrisome lytic or sclerotic osseous abnormality. Degenerative changes noted lumbar spine. Stable mild skin thickening lower right anterior abdominal wall. IMPRESSION: 1. Continued further progression of the multiple hepatic metastases. 2. Bulky gastrohepatic ligament lymphadenopathy has progressed in there appears to be a new enlarged lymph node in the hepato duodenal ligament on today's exam. 3. Distal esophageal wall thickening seen previously is less prominent today. 4. Nonobstructing staghorn calculus left kidney, similar to prior. 5. Stable appearance mild skin thickening lower anterior right abdominal wall. 6.  Aortic Atherosclerois (ICD10-170.0) Electronically Signed   By: Misty Stanley M.D.   On: 12/04/2018 12:19   Mr Jeri Cos LK Contrast  Result Date: 12/17/2018 CLINICAL DATA:  Malignant neoplasm of overlapping sites of esophagus, intermittent confusion. EXAM: MRI HEAD  WITHOUT AND WITH CONTRAST TECHNIQUE: Multiplanar, multiecho pulse sequences of the brain and surrounding structures were obtained without and with intravenous contrast. CONTRAST:  7.5 mL Gadavist COMPARISON:  Brain MRI 07/21/2018 FINDINGS: Brain: Multiple sequences are motion degraded. No restricted diffusion is demonstrated to suggest acute or recent subacute infarction. No intracranial hemorrhage, midline shift or extra-axial collection. Mild  generalized parenchymal atrophy. Moderate ill-defined and scattered T2/FLAIR hyperintensity within the cerebral white matter and brainstem is nonspecific, but consistent with chronic small vessel ischemic disease. Redemonstrated small chronic left occipital lobe infarct. There is a 3 mm focus of enhancement within the anterior left temporal lobe which was not present on prior exam (series 18, image 59). No other abnormal intracranial enhancement. Vascular: Flow voids maintained within the proximal large vessels. Skull and upper cervical spine: Normal marrow signal. Susceptibility artifact from spinal fusion hardware partially visualized. Sinuses/Orbits: Bilateral lens replacements. The globes and orbits are otherwise unremarkable. Small left sphenoid sinus air-fluid level. No significant mastoid effusion at the levels imaged. IMPRESSION: Motion degraded examination. A 3 mm focus of enhancement within the anterior left temporal lobe was not present on prior MRI 07/21/2018. Findings are suspicious for possible metastatic lesion. Short interval MRI follow-up is recommended, as clinically warranted Stable moderate chronic small vessel ischemic disease with redemonstrated chronic left occipital lobe infarct. Electronically Signed   By: Kellie Simmering   On: 12/17/2018 18:06    PERFORMANCE STATUS (ECOG) : 1 - Symptomatic but completely ambulatory  Review of Systems Unless otherwise noted, a complete review of systems is negative.  Physical Exam General: NAD, frail appearing,  thin Pulmonary: unlabored Extremities: edema LEs Skin: no rashes Neurological: Weakness, some confusion   IMPRESSION: Routine follow-up visit today.  Patient was seen following her visit with Dr. Tasia Catchings.   Patient says she spoke with Dr. Tasia Catchings about options for transition to comfort care/hospice versus initiation of fifth line treatment.  Patient says that she can feels some symptoms she attributes to decline such as pain, but that she does not feel that she can "give up".  She used to have some intermittent confusion although her overall cognition appears improved from where it was when last seen.  I met in private with patient's son.  He verbalized a clear understanding that additional chemotherapy may lead to symptoms that negatively impact patient's quality of life (e.g. alopecia, nausea, vomiting, diarrhea).  He plans to speak with patient but wants to honor her wishes whether she does or does not pursue treatment.  PLAN: -Continue current scope of treatment -Agree with initiation of transdermal fentanyl -RTC next week   Time Total: 30 minutes  Visit consisted of counseling and education dealing with the complex and emotionally intense issues of symptom management and palliative care in the setting of serious and potentially life-threatening illness.Greater than 50%  of this time was spent counseling and coordinating care related to the above assessment and plan.  Signed by: Altha Harm, PhD, NP-C [phone] (Work Cell)

## 2018-12-26 DIAGNOSIS — E43 Unspecified severe protein-calorie malnutrition: Secondary | ICD-10-CM | POA: Insufficient documentation

## 2018-12-26 NOTE — Progress Notes (Signed)
Hematology/Oncology Follow Up Note [facility]  Telephone:([phone] [phone]  Patient Care Team: Tonia Ghent, MD as PCP - General (Family Medicine)   Name of the patient: [name]  [id number]  August 14, 1934   REASON FOR VISIT  follow-up for chemotherapy management of esophageal adenocarcinoma,   HISTORY OF PRESENT ILLNESS Patient is a 84-old female who has metastatic esophageal cancer with liver involvement.  Weight loss 20 pounds for the past year prior to diagnosis.  # EGD during admission showed partially obstructive esophageal mass at the distal part of esophagus. Biopsy was taken. Pathology showed esophageal adenocarcinoma. #US biopsy of liver lesion to proof distant metastasis.  Report family history of breast cancer, but she has been having routine mammograms.  # Lives alone. She's a former smoker.  # Molecular Testing:  #[employer] Cdx: No reportable alternations with companion diagnostic (CDx) claims.  MS stable Tumor mutation burden: Cannot be determined, CCND3 amplification: Equivocal.  Amended reports on 06/04/2017, Tumor mutational burden changed from "can not be determined" to "TMB -low" on the re-analyzed samples.  Tumor Proportion Score [TPS] 20%. # HER 2 negative.   Antineoplasm Treatments S/p 6 cycles of FOLFOX, PET scan showed excellent partial response from both chemotherapy and radiation, result was discussed with patient.  Patient was switched to 5-FU maintenance and to September 2019. S/p palliative radiation in January 2019 # April 2019 Developed esophageal stricture secondary to radiation in April and got dilation via endoscopy. EGD shoed Benign-appearing esophageal stenosis. This is a result of radiation therapy.- LA Grade C radiation esophagitis. - The previously noted esophageal mass in Nov 2018 was much decreased indicating good response to radiation/chemotherapy. - Normal stomach.- Normal examined jejunum.-  No specimens collected.   #01/23/2018 PET scan showed interval development of loculated ascites within the upper abdomen with overlies the liver and the spleen and exhibited increased radio tracer uptake.  Worrisome for peritoneal carcinomatosis.  Soft tissue infiltration within the omentum with increased radiotracer uptake noted.  Worrisome for peritoneal carcinomatosis.  New hypermetabolic internal mammary lymph nodes and a right CP angle lymph node.  Suspicious for metastatic adenopathy.  Mild FDG uptake is identified localizing to the lower third of the esophagus raising SUV max of 5.15.  # 01/26/2018 2nd line treatment Taxol and Cyramza  12/31/20219  CT chest abdomen pelvis unfortunately showed mixed response. # 05/27/2018 3nd line treatment with pembrolizumab.  07/21/2018 MRI of brain showed a subacute stroke. Patient was advised to restart on aspirin 81 mg.  And to follow-up with primary care physician. Patient has been seen by PCP and had a discussion about subacute stroke.  Patient stopped using aspirin because of increased bruising.  Decision was made to take aspirin 81 mg every other day.  Statin was not started given patient's metastatic esophageal cancer setting and a limited life span.  Metoprolol was discontinued due to low blood pressure.  # 08/18/2018 CT chest abdomen pelvis showed progression.  08/31/2018 started on oral chemotherapy Lonsurf 35 mg/m BID, day 1-5, and day 8-12 every 28 cycles. 12/07/2018 Lonsurf is stopped due to disease progression.   INTERVAL HISTORY  84 y.o. female with history of metastatic esophageal cancer present for evaluation and discussion of MRI brain results. Patient reports right upper quadrant pain, she feels the pain has been worst recently.  Not fully relieved with tramadol as needed. Appetite is poor. Continue to lose weight She has significant bilateral lower extremity swelling, small open wound seeping fluid Denies any  fever, chills.    Review of  Systems  Constitutional: Positive for appetite change, fatigue and unexpected weight change. Negative for chills and fever.  HENT:   Negative for hearing loss and voice change.   Eyes: Negative for eye problems.  Respiratory: Negative for chest tightness and cough.   Cardiovascular: Positive for leg swelling. Negative for chest pain.  Gastrointestinal: Negative for abdominal distention, abdominal pain, blood in stool and nausea.  Endocrine: Negative for hot flashes.  Genitourinary: Positive for frequency. Negative for difficulty urinating and dysuria.   Musculoskeletal: Negative for arthralgias and flank pain.  Skin: Negative for itching and rash.  Neurological: Positive for numbness. Negative for extremity weakness.  Hematological: Negative for adenopathy.  Psychiatric/Behavioral: Positive for confusion.     Allergies  Allergen Reactions   Cefuroxime Nausea Only   Codeine Nausea Only   Doxycycline Other (See Comments)    Lip swelling   Gabapentin Other (See Comments)    Swelling.    Iohexol Itching    07-15-13 pt developed itching on fingers after contrast given. Dr. Irish Elders looked at pt and said to put in system as allergy. BB   Other Other (See Comments)    Contrast Dye- caused fever, chills, awful feeling Bandages - itching, rash   Oxycodone Other (See Comments)    nausea   Quinolones Swelling    Lip swelling   Synvisc [Hylan G-F 20] Swelling     Past Medical History:  Diagnosis Date   Arthritis    Asthma    COPD (chronic obstructive pulmonary disease) (HCC)    Dysphonia    Dyspnea    Esophageal cancer (HCC)    GERD (gastroesophageal reflux disease)    Heart murmur    History of kidney stones    Hypertension    Hypokalemia 10/07/2017   Melanoma (HCC)    OAB (overactive bladder)    OSA on CPAP    Paresthesia    from neck down after spinal abscess   Personal history of chemotherapy    esophageal cancer   Personal history of radiation  therapy    esophageal cancer   Pupil asymmetry    R pupil defect   Skin cancer    Sleep apnea    Spinal cord abscess      Past Surgical History:  Procedure Laterality Date   ABDOMINAL HYSTERECTOMY     ANTERIOR CERVICAL DECOMP/DISCECTOMY FUSION N/A 07/16/2013   Procedure: ANTERIOR CERVICAL DECOMPRESSION/DISCECTOMY FUSION mutlipleLEVELS C4-7;  Surgeon: Erline Levine, MD;  Location: [facility];  Service: Neurosurgery;  Laterality: N/A;   APPENDECTOMY     carpel tunn Bilateral    CATARACT EXTRACTION     CATARACT EXTRACTION, BILATERAL     CHOLECYSTECTOMY     dental implant     ESOPHAGOGASTRODUODENOSCOPY Left 03/21/2017   Procedure: ESOPHAGOGASTRODUODENOSCOPY (EGD);  Surgeon: Virgel Manifold, MD;  Location: [facility] ENDOSCOPY;  Service: Endoscopy;  Laterality: Left;   ESOPHAGOGASTRODUODENOSCOPY (EGD) WITH PROPOFOL N/A 08/20/2017   Procedure: ESOPHAGOGASTRODUODENOSCOPY (EGD) WITH PROPOFOL;  Surgeon: Virgel Manifold, MD;  Location: [facility] ENDOSCOPY;  Service: Endoscopy;  Laterality: N/A;   ESOPHAGOGASTRODUODENOSCOPY (EGD) WITH PROPOFOL N/A 09/19/2017   Procedure: ESOPHAGOGASTRODUODENOSCOPY (EGD) WITH PROPOFOL;  Surgeon: Virgel Manifold, MD;  Location: [facility] ENDOSCOPY;  Service: Endoscopy;  Laterality: N/A;   ESOPHAGOGASTRODUODENOSCOPY (EGD) WITH PROPOFOL N/A 10/03/2017   Procedure: ESOPHAGOGASTRODUODENOSCOPY (EGD) WITH PROPOFOL;  Surgeon: Virgel Manifold, MD;  Location: [facility] ENDOSCOPY;  Service: Endoscopy;  Laterality: N/A;   ESOPHAGOGASTRODUODENOSCOPY (EGD) WITH PROPOFOL  N/A 10/22/2017   Procedure: ESOPHAGOGASTRODUODENOSCOPY (EGD) WITH PROPOFOL;  Surgeon: Virgel Manifold, MD;  Location: [facility] ENDOSCOPY;  Service: Endoscopy;  Laterality: N/A;   ESOPHAGOGASTRODUODENOSCOPY (EGD) WITH PROPOFOL N/A 11/18/2017   Procedure: ESOPHAGOGASTRODUODENOSCOPY (EGD) WITH PROPOFOL;  Surgeon: Virgel Manifold, MD;  Location: [facility] ENDOSCOPY;  Service: Endoscopy;  Laterality: N/A;     ESOPHAGOGASTRODUODENOSCOPY (EGD) WITH PROPOFOL N/A 12/17/2017   Procedure: ESOPHAGOGASTRODUODENOSCOPY (EGD) WITH PROPOFOL;  Surgeon: Virgel Manifold, MD;  Location: [facility] ENDOSCOPY;  Service: Endoscopy;  Laterality: N/A;   ESOPHAGOGASTRODUODENOSCOPY (EGD) WITH PROPOFOL N/A 01/08/2018   Procedure: ESOPHAGOGASTRODUODENOSCOPY (EGD) WITH PROPOFOL;  Surgeon: Virgel Manifold, MD;  Location: [facility] ENDOSCOPY;  Service: Endoscopy;  Laterality: N/A;   ESOPHAGOGASTRODUODENOSCOPY (EGD) WITH PROPOFOL N/A 01/22/2018   Procedure: ESOPHAGOGASTRODUODENOSCOPY (EGD) WITH PROPOFOL;  Surgeon: Virgel Manifold, MD;  Location: [facility] ENDOSCOPY;  Service: Endoscopy;  Laterality: N/A;   ESOPHAGOGASTRODUODENOSCOPY (EGD) WITH PROPOFOL N/A 07/15/2018   Procedure: ESOPHAGOGASTRODUODENOSCOPY (EGD) WITH PROPOFOL;  Surgeon: Virgel Manifold, MD;  Location: [facility] ENDOSCOPY;  Service: Endoscopy;  Laterality: N/A;   EYE SURGERY     GASTRIC BYPASS     HERNIA REPAIR     HIATAL HERNIA REPAIR     JOINT REPLACEMENT Bilateral    knee   MELANOMA EXCISION     PORTA CATH INSERTION N/A 04/07/2017   Procedure: PORTA CATH INSERTION;  Surgeon: Algernon Huxley, MD;  Location: [place] CV LAB;  Service: Cardiovascular;  Laterality: N/A;   POSTERIOR CERVICAL FUSION/FORAMINOTOMY N/A 07/28/2013   Procedure: Cervical four-seven  posterior cervical fusion;  Surgeon: Erline Levine, MD;  Location: [place] [facility];  Service: Neurosurgery;  Laterality: N/A;   TONSILLECTOMY     TOTAL KNEE ARTHROPLASTY      Social History   Socioeconomic History   Marital status: Widowed    Spouse name: Not on file   Number of children: 2   Years of education: Not on file   Highest education level: Master's degree (e.g., MA, MS, MEng, MEd, MSW, MBA)  Occupational History   Not on file  Social Needs   Financial resource strain: Not hard at all   Food insecurity    Worry: Never true    Inability: Never true   Transportation  needs    Medical: No    Non-medical: No  Tobacco Use   Smoking status: Former Smoker    Packs/day: 1.00    Years: 20.00    Pack years: 20.00    Types: Cigarettes    Quit date: 1977    Years since quitting: 43.6   Smokeless tobacco: Never Used  Substance and Sexual Activity   Alcohol use: No   Drug use: Never   Sexual activity: Not Currently  Lifestyle   Physical activity    Days per week: 2 days    Minutes per session: 30 min   Stress: Not at all  Relationships   Social connections    Talks on phone: More than three times a week    Gets together: More than three times a week    Attends religious service: More than 4 times per year    Active member of club or organization: Yes    Attends meetings of clubs or organizations: More than 4 times per year    Relationship status: Widowed   Intimate partner violence    Fear of current or ex partner: No    Emotionally abused: No    Physically abused: No    Forced sexual  activity: No  Other Topics Concern   Not on file  Social History Narrative   Marital Status- Widowed    Lives by herself    Employement- Retired Pharmacist, hospital   Exercise hx- Does PT     Family History  Problem Relation Age of Onset   Breast cancer Mother 21   Arthritis-Osteo Mother    Breast cancer Sister 74   Bladder Cancer Sister    Bladder Cancer Father    Tongue cancer Father    Congenital heart disease Father    Prostate cancer Brother    Uterine cancer Maternal Aunt    Lung cancer Paternal Uncle    Leukemia Maternal Grandmother    Liver cancer Paternal Grandmother    Colon cancer Neg Hx      Current Outpatient Medications:    albuterol (PROVENTIL HFA;VENTOLIN HFA) 108 (90 Base) MCG/ACT inhaler, Inhale 2 puffs into the lungs every 4 (four) hours as needed for wheezing or shortness of breath., Disp: , Rfl:    amoxicillin-clavulanate (AUGMENTIN) 875-125 MG tablet, Take 1 tablet by mouth 2 (two) times daily., Disp: 14 tablet,  Rfl: 0   aspirin EC 81 MG tablet, Take 1 tablet (81 mg total) by mouth every other day., Disp: , Rfl:    calcium carbonate (TUMS EX) 750 MG chewable tablet, Chew 1 tablet by mouth as needed for heartburn. , Disp: , Rfl:    chlorhexidine (PERIDEX) 0.12 % solution, Use as directed 15 mLs in the mouth or throat 2 (two) times daily., Disp: , Rfl:    Cholecalciferol (VITAMIN D) 2000 UNITS CAPS, Take 2,000 Units by mouth daily., Disp: , Rfl:    dexamethasone (DECADRON) 1 MG tablet, Take 2 tablets (2 mg total) by mouth daily., Disp: 60 tablet, Rfl: 0   Diclofenac Sodium 1 % CREA, Apply to left arm as needed for pain three times daily, Disp: 120 g, Rfl: 0   dronabinol (MARINOL) 5 MG capsule, Take 1 capsule (5 mg total) by mouth 4 (four) times daily., Disp: 120 capsule, Rfl: 0   fluticasone (FLONASE) 50 MCG/ACT nasal spray, Place 1-2 sprays into both nostrils daily., Disp: 16 g, Rfl: 5   furosemide (LASIX) 20 MG tablet, TAKE 1-2 TABLETS BY MOUTH DAILY AS NEEDED FOR EDEMA, Disp: 180 tablet, Rfl: 1   hydrocortisone cream 1 %, Apply 1 application topically daily as needed for itching. , Disp: , Rfl:    loratadine (CLARITIN) 10 MG tablet, Take 10 mg by mouth daily., Disp: , Rfl:    Multiple Vitamins-Minerals (MULTIVITAMIN ADULT) CHEW, Chew 1 tablet by mouth daily. , Disp: , Rfl:    MYRBETRIQ 25 MG TB24 tablet, TAKE ONE TABLET EVERY DAY, Disp: 30 tablet, Rfl: 5   oxybutynin (DITROPAN) 5 MG tablet, TAKE ONE TABLET BY MOUTH EVERY MORNING AND TAKE ONE-HALF TABLET AT BEDTIME, Disp: 45 tablet, Rfl: 3   pantoprazole (PROTONIX) 40 MG tablet, TAKE 1 TABLET BY MOUTH TWICE DAILY BEFORE A MEAL, Disp: 60 tablet, Rfl: 3   Polyethyl Glycol-Propyl Glycol (SYSTANE OP), Apply 1 drop to eye daily as needed (dry eyes)., Disp: , Rfl:    potassium chloride (KLOR-CON) 20 MEQ packet, Take 20 mEq by mouth daily., Disp: 30 packet, Rfl: 0   pregabalin (LYRICA) 150 MG capsule, TAKE 1 CAPSULE TWICE DAILY, Disp: 60  capsule, Rfl: 3   prochlorperazine (COMPAZINE) 10 MG tablet, TAKE ONE TABLET BY MOUTH EVERY 6 HOURS AS NEEDED FOR NAUSEA OR VOMITING, Disp: 30 tablet, Rfl: 1   pyridOXINE (  VITAMIN B-6) 100 MG tablet, Take 1 tablet (100 mg total) by mouth daily., Disp: 30 tablet, Rfl: 3   SENNA-PLUS 8.6-50 MG tablet, TAKE TWO TABLETS BY MOUTH EVERY DAY, Disp: 60 tablet, Rfl: 0   sulfamethoxazole-trimethoprim (BACTRIM DS) 800-160 MG tablet, Take 1 tablet by mouth 2 (two) times daily., Disp: 28 tablet, Rfl: 0   traMADol (ULTRAM) 50 MG tablet, Take 50 mg by mouth every 6 (six) hours as needed., Disp: , Rfl:    traZODone (DESYREL) 50 MG tablet, Take 0.5-1 tablets (25-50 mg total) by mouth at bedtime as needed for sleep., Disp: , Rfl:    trifluridine-tipiracil (LONSURF) 15-6.14 MG tablet, Take 2 tablets by mouth two (2) times daily after a meal. Take with two 57m tablets. Take only on days 1-5, 8-12. Repeat every 28days., Disp: 40 tablet, Rfl: 3   trifluridine-tipiracil (LONSURF) 20-8.19 MG tablet, Take 2 tablets by mouth two (2) times daily after a meal. Take with two 175mtablets. Take only on days 1-5, 8-12. Repeat every 28days., Disp: 40 tablet, Rfl: 3   fentaNYL (DURAGESIC) 12 MCG/HR, Place 1 patch onto the skin every 3 (three) days for 7 doses., Disp: 10 patch, Rfl: 0 No current facility-administered medications for this visit.   Facility-Administered Medications Ordered in Other Visits:    heparin lock flush 100 unit/mL, 500 Units, Intravenous, Once, YuEarlie ServerMD   sodium chloride flush (NS) 0.9 % injection 10 mL, 10 mL, Intravenous, PRN, YuEarlie ServerMD, 10 mL at 01/26/18 0904   sodium chloride flush (NS) 0.9 % injection 10 mL, 10 mL, Intravenous, Once, YuEarlie ServerMD  Physical exam:   Today's Vitals   12/25/18 1452  BP: 100/60  Pulse: 72  Temp: 98.4 F (36.9 C)  TempSrc: Tympanic   There is no height or weight on file to calculate BMI. ECOG 3 Physical Exam  Constitutional: She is oriented to  person, place, and time. No distress.  Frail.sitting in wheelchair.  HENT:  Head: Normocephalic and atraumatic.  Nose: Nose normal.  Mouth/Throat: Oropharynx is clear and moist. No oropharyngeal exudate.  Eyes: Pupils are equal, round, and reactive to light. Conjunctivae and EOM are normal. No scleral icterus.  Neck: Normal range of motion. Neck supple. No JVD present.  Cardiovascular: Normal rate and regular rhythm.  Murmur heard. Pulmonary/Chest: Effort normal and breath sounds normal. No respiratory distress. She has no rales. She exhibits no tenderness.  Abdominal: Soft. Bowel sounds are normal. She exhibits no distension. There is no abdominal tenderness.  Hepatomegaly.  Musculoskeletal: Normal range of motion.        General: Edema present. No tenderness or deformity.  Lymphadenopathy:    She has no cervical adenopathy.  Neurological: She is alert and oriented to person, place, and time. No cranial nerve deficit. She exhibits normal muscle tone. Coordination normal.  Skin: Skin is warm and dry. She is not diaphoretic. No erythema.  Psychiatric: Affect and judgment normal.    CMP Latest Ref Rng & Units 12/08/2018  Glucose 70 - 99 mg/dL 101(H)  BUN 8 - 23 mg/dL 34(H)  Creatinine 0.44 - 1.00 mg/dL 1.25(H)  Sodium 135 - 145 mmol/L 131(L)  Potassium 3.5 - 5.1 mmol/L 5.2(H)  Chloride 98 - 111 mmol/L 99  CO2 22 - 32 mmol/L 23  Calcium 8.9 - 10.3 mg/dL 8.4(L)  Total Protein 6.5 - 8.1 g/dL 6.6  Total Bilirubin 0.3 - 1.2 mg/dL 0.9  Alkaline Phos 38 - 126 U/L 167(H)  AST 15 - 41  U/L 73(H)  ALT 0 - 44 U/L 32   CBC Latest Ref Rng & Units 12/08/2018  WBC 4.0 - 10.5 K/uL 6.6  Hemoglobin 12.0 - 15.0 g/dL 7.7(L)  Hematocrit 36.0 - 46.0 % 24.8(L)  Platelets 150 - 400 K/uL 173   RADIOGRAPHIC STUDIES: I have personally reviewed the radiological images as listed and agreed with the findings in the report.  PET scan 01/23/2018  Interval development of loculated ascites within the upper  abdomen which overlies the liver and spleen and exhibits increased radiotracer uptake. Findings worrisome for peritoneal carcinomatosis. Consider further evaluation with diagnostic paracentesis. 2. Soft tissue infiltration within the omentum with increased radiotracer uptake noted. Also worrisome for peritoneal carcinomatosis. 3. New hypermetabolic internal mammary lymph nodes and right CP angle lymph node. Suspicious for metastatic adenopathy. 4. Mild FDG uptake is identified localizing to the lower third of the esophagus within SUV max of 5.15.  Bone Scan 05/08/2018  1. Several small foci of activity in anterior ribs as noted above, and metastatic involvement can not be excluded. 2. Somewhat mottled activity within the midthoracic and upper lumbar spine and metastatic involvement can not be excluded. Correlate with plain films or MRI preferably.  08/18/2018 CT chest abdomen pelvis with contrast 1. New and enlarging tumors throughout the liver compatible with progressive malignancy. 2. New 2.0 by 3.1 cm soft tissue density between the stomach in the liver, probably a malignant deposit/lymph node. 3. Stable mild circumferential wall thickening in the distal esophagus. Prior gastric bypass. 4. Other imaging findings of potential clinical significance: Aortic Atherosclerosis (ICD10-I70.0). Coronary atherosclerosis. Mild cardiomegaly. Aortic and mitral valve calcifications. Mild increase in right middle lobe atelectasis without obvious tumor in the right middle lobe. Scarring or atelectasis in the lung bases. New trace left pleural effusion. Thoracolumbar spondylosis and degenerative disc disease. Non-specific 3 mm hypodense lesion in the spleen. Stable left kidney lower pole staghorn calculus, nonobstructive. Cutaneous thickening in the right lower quadrant anterior abdominal wall, possibly from low-grade panniculitis.  Assessment and plan  Cancer Staging Malignant neoplasm of lower third of  esophagus [facility]) Staging form: Esophagus - Adenocarcinoma, AJCC 8th Edition - Clinical stage from 04/10/2017: Stage IVB (cTX, cNX, pM1) - Signed by Earlie Server, MD on 04/10/2017  1. Esophageal adenocarcinoma ([place])   2. Severe protein-calorie malnutrition ([place])   3. Bilateral lower extremity edema   4. Goals of care, counseling/discussion   5. Malignant neoplasm of overlapping sites of esophagus ([place])    #Esophageal cancer, metastatic,CEA is not a marker anymore.  Progressed on 4th line treatment.  MRI brain image was independent reviewed by me and discussed with patient. Patient son Yvone Neu was also called. MRI brain shows a 3 mm foci suspicious for brain metastasis. Prognosis is extremely poor. She has progressed after fourth lines of treatment. Additional chemotherapy options will be Irinotecan. However patient's performance status is very poor at this point, I am concerned that additional chemo may do more harm than benefit to her and decrease her life quality at end of life.  I recommend patient to consider comfort care. Had a lengthy discussion with both patient and son.  Patient's son understands and agree with no additional treatment. Patient does not want to proceed with comfort care/hospice, still wants additional treatments. Will obtain blood work and reevaluate.  Bilateral lower extremity swelling,  Resume Lasix 40 mg daily as needed swelling.  Severe protein calorie malnutrition, Continue dexamethasone 2 mg daily for appetite stimulants.  Neoplasm related pain, continue tramadol 50 mg  every 6 hours as needed.  Add fentanyl patch 57mg/hr, apply 1 patch every 3 days.  Prescription sent to pharmacy.  We spent sufficient time to discuss many aspect of care, questions were answered to patient's satisfaction. Total face to face encounter time for this patient visit was 40 min. >50% of the time was  spent in counseling and coordination of care.     ZEarlie Server MD, PhD

## 2018-12-27 NOTE — Telephone Encounter (Signed)
Sent. Thanks.   

## 2018-12-28 ENCOUNTER — Other Ambulatory Visit: Payer: Self-pay | Admitting: *Deleted

## 2018-12-28 ENCOUNTER — Other Ambulatory Visit: Payer: Self-pay

## 2018-12-28 ENCOUNTER — Inpatient Hospital Stay: Payer: Medicare Other

## 2018-12-28 ENCOUNTER — Other Ambulatory Visit: Payer: Self-pay | Admitting: Family Medicine

## 2018-12-28 DIAGNOSIS — C159 Malignant neoplasm of esophagus, unspecified: Secondary | ICD-10-CM

## 2018-12-28 DIAGNOSIS — Z5111 Encounter for antineoplastic chemotherapy: Secondary | ICD-10-CM

## 2018-12-28 DIAGNOSIS — C158 Malignant neoplasm of overlapping sites of esophagus: Secondary | ICD-10-CM

## 2018-12-28 DIAGNOSIS — Z452 Encounter for adjustment and management of vascular access device: Secondary | ICD-10-CM | POA: Diagnosis not present

## 2018-12-28 LAB — CBC WITH DIFFERENTIAL/PLATELET
Abs Immature Granulocytes: 0.1 10*3/uL — ABNORMAL HIGH (ref 0.00–0.07)
Basophils Absolute: 0 10*3/uL (ref 0.0–0.1)
Basophils Relative: 0 %
Eosinophils Absolute: 0.1 10*3/uL (ref 0.0–0.5)
Eosinophils Relative: 1 %
HCT: 29.6 % — ABNORMAL LOW (ref 36.0–46.0)
Hemoglobin: 9 g/dL — ABNORMAL LOW (ref 12.0–15.0)
Immature Granulocytes: 2 %
Lymphocytes Relative: 9 %
Lymphs Abs: 0.4 10*3/uL — ABNORMAL LOW (ref 0.7–4.0)
MCH: 30.7 pg (ref 26.0–34.0)
MCHC: 30.4 g/dL (ref 30.0–36.0)
MCV: 101 fL — ABNORMAL HIGH (ref 80.0–100.0)
Monocytes Absolute: 0.5 10*3/uL (ref 0.1–1.0)
Monocytes Relative: 12 %
Neutro Abs: 3.4 10*3/uL (ref 1.7–7.7)
Neutrophils Relative %: 76 %
Platelets: 185 10*3/uL (ref 150–400)
RBC: 2.93 MIL/uL — ABNORMAL LOW (ref 3.87–5.11)
RDW: 19.2 % — ABNORMAL HIGH (ref 11.5–15.5)
WBC: 4.5 10*3/uL (ref 4.0–10.5)
nRBC: 0 % (ref 0.0–0.2)

## 2018-12-28 LAB — COMPREHENSIVE METABOLIC PANEL
ALT: 21 U/L (ref 0–44)
AST: 69 U/L — ABNORMAL HIGH (ref 15–41)
Albumin: 3 g/dL — ABNORMAL LOW (ref 3.5–5.0)
Alkaline Phosphatase: 163 U/L — ABNORMAL HIGH (ref 38–126)
Anion gap: 10 (ref 5–15)
BUN: 14 mg/dL (ref 8–23)
CO2: 28 mmol/L (ref 22–32)
Calcium: 8.7 mg/dL — ABNORMAL LOW (ref 8.9–10.3)
Chloride: 95 mmol/L — ABNORMAL LOW (ref 98–111)
Creatinine, Ser: 0.82 mg/dL (ref 0.44–1.00)
GFR calc Af Amer: >60 mL/min (ref >60)
GFR calc non Af Amer: >60 mL/min (ref >60)
Glucose, Bld: 124 mg/dL — ABNORMAL HIGH (ref 70–99)
Potassium: 3.9 mmol/L (ref 3.5–5.1)
Sodium: 133 mmol/L — ABNORMAL LOW (ref 135–145)
Total Bilirubin: 0.9 mg/dL (ref 0.3–1.2)
Total Protein: 6.8 g/dL (ref 6.5–8.1)

## 2018-12-28 MED ORDER — PREGABALIN 150 MG PO CAPS: 150.0000 mg | ORAL_CAPSULE | Freq: Two times a day (BID) | ORAL | 3 refills | Status: AC

## 2018-12-28 NOTE — Progress Notes (Unsigned)
rx resent after reported erx failure.

## 2018-12-29 LAB — CEA: CEA: 2.2 ng/mL (ref 0.0–4.7)

## 2018-12-31 ENCOUNTER — Telehealth: Payer: Self-pay | Admitting: *Deleted

## 2018-12-31 ENCOUNTER — Telehealth: Payer: Self-pay

## 2018-12-31 ENCOUNTER — Encounter: Payer: Self-pay | Admitting: Oncology

## 2018-12-31 NOTE — Telephone Encounter (Signed)
[name].  Discussed recent events.  Two main issues.  1. Acute changes in the last ~2 days.  2. Long term goals/care/prognosis.  Plan 1. With acute change noted, then reasonable to get eval tonight, ie ER.   2. D/w him about considering her goals and care in the longer run.  If hospice will meet her goals, then I can put in referral.  He'll consider and d/w pt as she is able.    I will await ER note from tonight and then await update from patient/son about her long term goals.  He thanked me for the call.  I appreciate help of all involved, esp including Ken.

## 2018-12-31 NOTE — Telephone Encounter (Signed)
I spoke with son and he thinks she may be need IV fluids and is asking if the Fentanyl patch could be causing her symptoms since her labs are a little better and this is her first patch. Please advise

## 2018-12-31 NOTE — Telephone Encounter (Signed)
Labs are stable. Per son, pt is not clinically doing well. Recommend ER evaluation for changed mental status.

## 2018-12-31 NOTE — Telephone Encounter (Signed)
Son [name] called stating that patient had labs drawn on the 17 th and they are awaiting results form that and that she has become encephalitic and having jerking. He is asking if "something can be done for her to bring her out of this metabolic state". Please advise.  Comprehensive metabolic panel Order: [id number] Status:  Final result Visible to patient:  No (not released) Next appt:  01/05/2019 at 01:15 PM in Oncology ([facility]) Dx:  Encounter for antineoplastic chemothe...  Ref Range & Units 3d ago (12/28/18) 3wk ago (12/08/18) 3wk ago (12/07/18)  Sodium 135 - 145 mmol/L 133Low   131Low   131Low    Potassium 3.5 - 5.1 mmol/L 3.9  5.2High   5.4High    Chloride 98 - 111 mmol/L 95Low   99  97Low    CO2 22 - 32 mmol/L 28  23  21Low    Glucose, Bld 70 - 99 mg/dL 124High   101High   184High    BUN 8 - 23 mg/dL 14  34High   30High    Creatinine, Ser 0.44 - 1.00 mg/dL 0.82  1.25High   1.29High    Calcium 8.9 - 10.3 mg/dL 8.7Low   8.4Low   9.1   Total Protein 6.5 - 8.1 g/dL 6.8  6.6  7.5   Albumin 3.5 - 5.0 g/dL 3.0Low   2.9Low   3.4Low    AST 15 - 41 U/L 69High   73High   98High    ALT 0 - 44 U/L 21  32  37   Alkaline Phosphatase 38 - 126 U/L 163High   167High   188High    Total Bilirubin 0.3 - 1.2 mg/dL 0.9  0.9  1.3High    GFR calc non Af Amer >60 mL/min >60  40Low   39Low    GFR calc Af Amer >60 mL/min >60  46Low   45Low    Anion gap 5 - 15 10  9  CM  13 CM   Comment: Performed at [facility], [address] [place], [place] [address]  Resulting Agency  [facility]      Specimen Collected: 12/28/18 11:45 Last Resulted: 12/28/18 12:11     Lab Flowsheet   Order Details   View Encounter   Lab and Collection Details   Routing   Result History     CM=Additional comments      Other Results from 12/28/2018  CEA Order: [id number]  Status:  Final result Visible to patient:  No (not released) Next appt:  01/05/2019 at 01:15 PM in Oncology  ([facility]) Dx:  Malignant neoplasm of overlapping sit...  Ref Range & Units 3d ago 16mo ago 57mo ago  CEA 0.0 - 4.7 ng/mL 2.2  2.7 CM  2.3 CM   Comment: (NOTE)                Nonsmokers     <3.9                Smokers       <5.6  [employer] Electrochemiluminescence Immunoassay  (ECLIA)  Values obtained with different assay methods or kits  cannot be used interchangeably. Results cannot be  interpreted as absolute evidence of the presence or  absence of malignant disease.  Performed At: [facility]  [address] [place], [place] [id number]  Rush Farmer MD [id number]   Resulting Agency  [facility] [place]  [facility]      Specimen Collected: 12/28/18 11:45 Last Resulted: 12/29/18 08:37     Lab Flowsheet   Order Details   View Encounter   Lab and Collection Details   Routing   Result History     CM=Additional comments        Contains abnormal data CBC with Differential/Platelet Order: [id number]  Status:  Final result Visible to patient:  No (not released) Next appt:  01/05/2019 at 01:15 PM in Oncology ([facility]) Dx:  Encounter for antineoplastic chemothe...  Ref Range & Units 3d ago (12/28/18) 3wk ago (12/08/18) 3wk ago (12/07/18)  WBC 4.0 - 10.5 K/uL 4.5  6.6  8.6   RBC 3.87 - 5.11 MIL/uL 2.93Low   2.45Low   2.88Low    Hemoglobin 12.0 - 15.0 g/dL 9.0Low   7.7Low   9.1Low    HCT 36.0 - 46.0 % 29.6Low   24.8Low   29.2Low    MCV 80.0 - 100.0 fL 101.0High   101.2High   101.4High    MCH 26.0 - 34.0 pg 30.7  31.4  31.6   MCHC 30.0 - 36.0 g/dL 30.4  31.0  31.2   RDW 11.5 - 15.5 % 19.2High   19.8High   19.9High    Platelets 150 - 400 K/uL 185  173  224   nRBC 0.0 - 0.2 % 0.0  0.5High   0.6High    Neutrophils Relative % % 76  89  91   Neutro Abs 1.7 - 7.7 K/uL 3.4  5.9  7.8High    Lymphocytes Relative % 9  5  5    Lymphs Abs 0.7 - 4.0 K/uL 0.4Low   0.3Low   0.4Low    Monocytes Relative % 12  4  3     Monocytes Absolute 0.1 - 1.0 K/uL 0.5  0.3  0.2   Eosinophils Relative % 1  0  0   Eosinophils Absolute 0.0 - 0.5 K/uL 0.1  0.0  0.0   Basophils Relative % 0  0  0   Basophils Absolute 0.0 - 0.1 K/uL 0.0  0.0  0.0   Immature Granulocytes % 2  2  1    Abs Immature Granulocytes 0.00 - 0.07 K/uL 0.10High   0.12High  CM  0.11High  CM   Comment: Performed at [facility], [address] [place], [place] [address]  Resulting Agency  [facility] LAB [facility]      Specimen Collected: 12/28/18 11:45 Last Resulted: 12/28/18 12:01

## 2018-12-31 NOTE — Telephone Encounter (Signed)
[name] (DPR signed); pt is also being seen at [facility]; pt is experiencing some additional lethargy and possibly encephalopathy with pts progression.Pt having problem expressing her thoughts. [name] Neu wants to talk with Dr Damita Dunnings about her case and the changes are happening faster than anticipated by son. No emergency but request cb when Dr Damita Dunnings can talk. Pt is at a cross roads with an acute change where [facility] are preferring pt to go to hospice; pt is thinking about it but may want to pursue something else; wants to know what pts options are; pt starting to use fentanyl patch. [name] Neu is sure she is dehydrated and does not want to go to ED; wants pt to get a bolus of fluid in and office. Advised if dehydrated should go to ED for IV fluids.[facility] suggest to [name] Neu to take pt to ED but [name] Neu does not think pt is at that point. [name] Neu wants to talk with Dr Damita Dunnings ASAP; I explained I am not sure when Dr Damita Dunnings will be able to return call and [name] Neu understands but really values Dr Carole Civil opinion.

## 2018-12-31 NOTE — Telephone Encounter (Signed)
Notified Ken.

## 2019-01-01 ENCOUNTER — Encounter: Payer: Medicare Other | Admitting: Hospice and Palliative Medicine

## 2019-01-04 ENCOUNTER — Other Ambulatory Visit: Payer: Self-pay | Admitting: Oncology

## 2019-01-04 ENCOUNTER — Other Ambulatory Visit: Payer: Self-pay

## 2019-01-05 ENCOUNTER — Inpatient Hospital Stay: Payer: Medicare Other

## 2019-01-05 ENCOUNTER — Other Ambulatory Visit: Payer: Self-pay

## 2019-01-05 DIAGNOSIS — Z95828 Presence of other vascular implants and grafts: Secondary | ICD-10-CM

## 2019-01-05 DIAGNOSIS — Z452 Encounter for adjustment and management of vascular access device: Secondary | ICD-10-CM | POA: Diagnosis not present

## 2019-01-05 MED ORDER — HEPARIN SOD (PORK) LOCK FLUSH 100 UNIT/ML IV SOLN
500.0000 [IU] | Freq: Once | INTRAVENOUS | Status: AC
  Administered 2019-01-05: 500 [IU] via INTRAVENOUS

## 2019-01-05 MED ORDER — SODIUM CHLORIDE 0.9% FLUSH
10.0000 mL | Freq: Once | INTRAVENOUS | Status: AC
  Administered 2019-01-05: 10 mL via INTRAVENOUS
  Filled 2019-01-05: qty 10

## 2019-01-05 NOTE — Telephone Encounter (Signed)
Per [name], his aunt did not know there was a new bottle still in the bag, so she does not need this refill at this time

## 2019-01-05 NOTE — Telephone Encounter (Signed)
Thanks

## 2019-01-05 NOTE — Telephone Encounter (Signed)
This was just prescribed last week. She should have plenty of this medication. She was given 60 tabs which should last her 2 weeks. Is she having uncontrolled pain?

## 2019-01-06 ENCOUNTER — Other Ambulatory Visit: Payer: Self-pay | Admitting: Hospice and Palliative Medicine

## 2019-01-06 DIAGNOSIS — C158 Malignant neoplasm of overlapping sites of esophagus: Secondary | ICD-10-CM

## 2019-01-06 NOTE — Progress Notes (Signed)
Spoke with patient's son by phone.  He states that patient has had slow decline in regards to weakness and poor oral intake.  He recognizes that patient is likely nearing end-of-life. Despite decline, patient continues to ask about further treatment and has not consented to hospice.  We again reviewed today the risks of fifth line irinotecan with high probability that patient would be symptomatic and her quality of life would be negatively affected.  Son verbalized feeling like hospice was the right choice.  Son is in agreement with a home-based palliative care referral.  He wants to impress upon that provider not to be "heavy handed" with discussions about hospice in front of the patient.  We discussed as needed clinic visits for symptom management.

## 2019-01-11 ENCOUNTER — Telehealth: Payer: Self-pay | Admitting: *Deleted

## 2019-01-11 DIAGNOSIS — R531 Weakness: Secondary | ICD-10-CM

## 2019-01-11 DIAGNOSIS — C159 Malignant neoplasm of esophagus, unspecified: Secondary | ICD-10-CM

## 2019-01-11 DIAGNOSIS — Z5189 Encounter for other specified aftercare: Secondary | ICD-10-CM

## 2019-01-11 NOTE — Telephone Encounter (Addendum)
[name] (DPR signed) left v/m that his mom is not as bad as last week but does think she needs IV fluids and request and rx for IV fluids that [name] can take to a facility and get his mom some IV fluids. I do not know of a facility other than the ED that gives outpt IV fluids if hospice is not involved.Please advise. Ken request cb. I spoke with [name] and pt did not go to ED on 12/31/18 due to when fentanyl patch started wearing off things turned around and pt felt better. [name] thinks she needs IV fluids; pt is dehydrated per [name]; pt only drinks a few sips of liquid at a time. And [name] wants labs to be done also for kidney function. Pt is not waking up a lot and not using fentanyl patches. [name] said at this moment pt is adamantly against hospice. Is not at peace with decisions being made such as possibly getting low level of chemo but [name] said has discussed twice with oncology and oncology has not offered to try that. [name] said pt is a little off and pt not participating and [name] this is due to dehydration. I advised would send note to Dr Damita Dunnings to see if can offer something besides hospice or ED. [name] is also waiting for cb from oncology to see if they can give IV fluids at their office. Please advise.

## 2019-01-11 NOTE — Telephone Encounter (Addendum)
Son called requesting to speak with Billey Chang, NP or Almyra Free He is requesting that patient have labs drawn and she receive IV fluids as she is feeling a little low the past few days. Please advise

## 2019-01-11 NOTE — Telephone Encounter (Signed)
Dr Damita Dunnings advised either [facility], ED or hospice to get outpt IV fluids and labs prior to IV fluids for kidney function. Yvone Neu thanked Dr Damita Dunnings and Yvone Neu is still waiting for response from [facility]. Yvone Neu will cb if needed. FYI to Dr Damita Dunnings.

## 2019-01-11 NOTE — Telephone Encounter (Signed)
Per secure chat Sharion Dove, NP: Will you please add Ms. Dona to the [facility] schedule tomorrow and call the son with the appointment Yes, CBC/CMET and IVFs on Lauren's schedule  I attempted twice to contact Yvone Neu, I could hear him, but he could not hear me so I called Mrs Mottingers home and her sister answered, I explained to her that I was trying to reach Yvone Neu and she told me he was out running errands. She accepted appointment for tomorrow at 1015 as she requested mid morning appointment time for lab, Lorretta Harp, NP who had availability and IVF

## 2019-01-12 ENCOUNTER — Inpatient Hospital Stay: Payer: Medicare Other | Attending: Oncology

## 2019-01-12 ENCOUNTER — Encounter: Payer: Self-pay | Admitting: Nurse Practitioner

## 2019-01-12 ENCOUNTER — Inpatient Hospital Stay (HOSPITAL_BASED_OUTPATIENT_CLINIC_OR_DEPARTMENT_OTHER): Payer: Medicare Other | Admitting: Nurse Practitioner

## 2019-01-12 ENCOUNTER — Inpatient Hospital Stay: Payer: Medicare Other

## 2019-01-12 ENCOUNTER — Other Ambulatory Visit: Payer: Self-pay

## 2019-01-12 VITALS — BP 107/75 | HR 71 | Temp 98.3°F | Resp 16 | Wt 181.0 lb

## 2019-01-12 DIAGNOSIS — Z95828 Presence of other vascular implants and grafts: Secondary | ICD-10-CM

## 2019-01-12 DIAGNOSIS — Z452 Encounter for adjustment and management of vascular access device: Secondary | ICD-10-CM | POA: Insufficient documentation

## 2019-01-12 DIAGNOSIS — C158 Malignant neoplasm of overlapping sites of esophagus: Secondary | ICD-10-CM | POA: Diagnosis present

## 2019-01-12 DIAGNOSIS — Z87891 Personal history of nicotine dependence: Secondary | ICD-10-CM | POA: Insufficient documentation

## 2019-01-12 DIAGNOSIS — C159 Malignant neoplasm of esophagus, unspecified: Secondary | ICD-10-CM

## 2019-01-12 DIAGNOSIS — C7931 Secondary malignant neoplasm of brain: Secondary | ICD-10-CM

## 2019-01-12 DIAGNOSIS — Z9884 Bariatric surgery status: Secondary | ICD-10-CM | POA: Diagnosis not present

## 2019-01-12 DIAGNOSIS — J449 Chronic obstructive pulmonary disease, unspecified: Secondary | ICD-10-CM | POA: Diagnosis not present

## 2019-01-12 DIAGNOSIS — Z803 Family history of malignant neoplasm of breast: Secondary | ICD-10-CM | POA: Insufficient documentation

## 2019-01-12 DIAGNOSIS — Z7982 Long term (current) use of aspirin: Secondary | ICD-10-CM | POA: Insufficient documentation

## 2019-01-12 DIAGNOSIS — Z923 Personal history of irradiation: Secondary | ICD-10-CM | POA: Diagnosis not present

## 2019-01-12 DIAGNOSIS — Z85820 Personal history of malignant melanoma of skin: Secondary | ICD-10-CM | POA: Insufficient documentation

## 2019-01-12 DIAGNOSIS — G4733 Obstructive sleep apnea (adult) (pediatric): Secondary | ICD-10-CM | POA: Insufficient documentation

## 2019-01-12 DIAGNOSIS — Z79899 Other long term (current) drug therapy: Secondary | ICD-10-CM | POA: Diagnosis not present

## 2019-01-12 DIAGNOSIS — Z90710 Acquired absence of both cervix and uterus: Secondary | ICD-10-CM | POA: Insufficient documentation

## 2019-01-12 DIAGNOSIS — I10 Essential (primary) hypertension: Secondary | ICD-10-CM | POA: Insufficient documentation

## 2019-01-12 DIAGNOSIS — R531 Weakness: Secondary | ICD-10-CM | POA: Diagnosis not present

## 2019-01-12 DIAGNOSIS — Z5189 Encounter for other specified aftercare: Secondary | ICD-10-CM

## 2019-01-12 DIAGNOSIS — Z9221 Personal history of antineoplastic chemotherapy: Secondary | ICD-10-CM | POA: Diagnosis not present

## 2019-01-12 LAB — CBC WITH DIFFERENTIAL/PLATELET
Abs Immature Granulocytes: 0.1 10*3/uL — ABNORMAL HIGH (ref 0.00–0.07)
Basophils Absolute: 0 10*3/uL (ref 0.0–0.1)
Basophils Relative: 0 %
Eosinophils Absolute: 0.1 10*3/uL (ref 0.0–0.5)
Eosinophils Relative: 1 %
HCT: 29.5 % — ABNORMAL LOW (ref 36.0–46.0)
Hemoglobin: 8.8 g/dL — ABNORMAL LOW (ref 12.0–15.0)
Immature Granulocytes: 1 %
Lymphocytes Relative: 7 %
Lymphs Abs: 0.5 10*3/uL — ABNORMAL LOW (ref 0.7–4.0)
MCH: 29.3 pg (ref 26.0–34.0)
MCHC: 29.8 g/dL — ABNORMAL LOW (ref 30.0–36.0)
MCV: 98.3 fL (ref 80.0–100.0)
Monocytes Absolute: 0.8 10*3/uL (ref 0.1–1.0)
Monocytes Relative: 12 %
Neutro Abs: 5.6 10*3/uL (ref 1.7–7.7)
Neutrophils Relative %: 79 %
Platelets: 268 10*3/uL (ref 150–400)
RBC: 3 MIL/uL — ABNORMAL LOW (ref 3.87–5.11)
RDW: 18.5 % — ABNORMAL HIGH (ref 11.5–15.5)
WBC: 7.1 10*3/uL (ref 4.0–10.5)
nRBC: 0 % (ref 0.0–0.2)

## 2019-01-12 LAB — COMPREHENSIVE METABOLIC PANEL
ALT: 26 U/L (ref 0–44)
AST: 80 U/L — ABNORMAL HIGH (ref 15–41)
Albumin: 2.9 g/dL — ABNORMAL LOW (ref 3.5–5.0)
Alkaline Phosphatase: 205 U/L — ABNORMAL HIGH (ref 38–126)
Anion gap: 10 (ref 5–15)
BUN: 16 mg/dL (ref 8–23)
CO2: 28 mmol/L (ref 22–32)
Calcium: 8.5 mg/dL — ABNORMAL LOW (ref 8.9–10.3)
Chloride: 94 mmol/L — ABNORMAL LOW (ref 98–111)
Creatinine, Ser: 0.87 mg/dL (ref 0.44–1.00)
GFR calc Af Amer: >60 mL/min (ref >60)
GFR calc non Af Amer: >60 mL/min (ref >60)
Glucose, Bld: 155 mg/dL — ABNORMAL HIGH (ref 70–99)
Potassium: 3.7 mmol/L (ref 3.5–5.1)
Sodium: 132 mmol/L — ABNORMAL LOW (ref 135–145)
Total Bilirubin: 0.9 mg/dL (ref 0.3–1.2)
Total Protein: 6.9 g/dL (ref 6.5–8.1)

## 2019-01-12 MED ORDER — SODIUM CHLORIDE 0.9% FLUSH
10.0000 mL | Freq: Once | INTRAVENOUS | Status: AC
  Administered 2019-01-12: 11:00:00 10 mL via INTRAVENOUS
  Filled 2019-01-12: qty 10

## 2019-01-12 MED ORDER — SODIUM CHLORIDE 0.9 % IV SOLN
Freq: Once | INTRAVENOUS | Status: AC
  Administered 2019-01-12: 13:00:00 via INTRAVENOUS
  Filled 2019-01-12: qty 250

## 2019-01-12 MED ORDER — HEPARIN SOD (PORK) LOCK FLUSH 100 UNIT/ML IV SOLN
500.0000 [IU] | Freq: Once | INTRAVENOUS | Status: AC
  Administered 2019-01-12: 13:00:00 500 [IU] via INTRAVENOUS

## 2019-01-12 NOTE — Telephone Encounter (Signed)
Noted. Thanks.  Will await input from the cancer center.

## 2019-01-12 NOTE — Progress Notes (Signed)
[facility]pt[facility]  Telephone:([phone] [phone]  Patient Care Team: Tonia Ghent, MD as PCP - General (Family Medicine)   Name of the patient: [name]  [id number]  [birth year]   Date of visit: 01/12/19  Diagnosis-metastatic esophageal adenocarcinoma  Chief complaint/ Reason for visit-poor oral intake, confusion, possible IV fluids  Heme/Onc history:  Oncology History Overview Note  Patient is a 84 year old female who has metastatic esophageal cancer with liver involvement.  Weight loss 20 pounds for the past year prior to diagnosis.  #EGD during admission showed partially obstructive esophageal mass at the distal part of esophagus. Biopsy was taken. Pathology showed esophageal adenocarcinoma. #US biopsy of liver lesion to proof distant metastasis.  Report family history of breast cancer,but she has been having routine mammograms.  # Lives alone.She's a former smoker.  # Molecular Testing:  #[facility] Cdx: No reportable alternations with companion diagnostic (CDx) claims.  MS stable Tumor mutation burden: Cannot be determined, CCND3 amplification: Equivocal.  Amended reports on 06/04/2017, Tumor mutational burden changed from can not be determined to "TMB -low" on the re-analyzed samples.  # HER 2 negative.   Current Treatment S/p 6 cycles of FOLFOX, PET scan showed excellent partial response from both chemotherapy and radiation, result was discussed with patient.  currently on 5-FU maintenance.  S/p palliative radiation in January 2019 # Developed esophageal stricture secondary to radiation in April and got dilation via endoscopy. EGD shoed Benign-appearing esophageal stenosis. This is a result of radiation therapy.- LA Grade C radiation esophagitis. - The previously noted esophageal mass in Nov 2018 was much decreased indicating good response to radiation/chemotherapy. - Normal stomach.- Normal examined  jejunum.- No specimens collected.      Malignant neoplasm of lower third of esophagus (HCC)  03/29/2017 Initial Diagnosis   Malignant neoplasm of lower third of esophagus ([place])   01/27/2018 - 05/24/2018 Chemotherapy   The patient had PACLitaxel (TAXOL) 162 mg in sodium chloride 0.9 % 250 mL chemo infusion (</= 65m/m2), 80 mg/m2 = 162 mg, Intravenous,  Once, 4 of 7 cycles Dose modification: 65 mg/m2 (original dose 80 mg/m2, Cycle 2, Reason: Provider Judgment), 70 mg/m2 (original dose 80 mg/m2, Cycle 4, Reason: Provider Judgment), 70 mg/m2 (original dose 80 mg/m2, Cycle 4, Reason: Provider Judgment) Administration: 162 mg (01/27/2018), 162 mg (03/02/2018), 132 mg (03/09/2018), 162 mg (02/03/2018), 162 mg (02/09/2018), 162 mg (02/23/2018), 162 mg (03/23/2018), 162 mg (03/30/2018), 162 mg (04/06/2018), 138 mg (04/27/2018), 138 mg (05/04/2018), 138 mg (05/11/2018) ramucirumab (CYRAMZA) 700 mg in sodium chloride 0.9 % 180 mL chemo infusion, 8 mg/kg = 700 mg, Intravenous, Once, 4 of 7 cycles Administration: 700 mg (01/27/2018), 700 mg (03/09/2018), 700 mg (02/09/2018), 700 mg (02/23/2018), 700 mg (03/23/2018), 700 mg (04/06/2018), 700 mg (04/27/2018), 700 mg (05/11/2018)  for chemotherapy treatment.    08/24/2018 -  Chemotherapy   The patient had [No matching medication found in this treatment plan]  for chemotherapy treatment.    [place]ophageal adenocarcinoma ([facility]   Initial Diagnosis   Esophageal adenocarcinoma ([place]   05/27/2018 - 08/18/2018 Chemotherapy   The patient had pembrolizumab (KEYTRUDA) 200 mg in sodium chloride 0.9 % 50 mL chemo infusion, 200 mg, Intravenous, Once, 4 of 5 cycles Administration: 200 mg (05/27/2018), 200 mg (06/17/2018), 200 mg (07/08/2018), 200 mg (07/29/2018)  for chemotherapy treatment.    08/24/2018 -  Chemotherapy   The patient had [No matching medication found in this treatment plan]  for chemotherapy treatment.  Treatment Summary:  - 6 cycles of FOLFOX chemotherapy plus  radiation followed by maintenance 5-FU-progressed - 01/26/2018-second line Taxol and Cyramza- progressed/mixed response - 05/27/2018- third line pembrolizumab- progressed - 08/31/2018- fourth line lonsurf- progressed per 12/07/2018 imaging  Interval history- [name], 84 year old female with metastatic esophageal adenocarcinoma recently progressed on fourth line Lonsurf, presents to symptom management clinic for poor oral intake, weakness, possible IV fluids.  Her son, who participates in visit by phone says that over the last couple of months he has noticed that she has had more intermittent confusion and seems to "spaced out' and gets distracted. He says that she eats & drinks very little and he has to constantly remind her to do so. Per patient she is in the chair most of the day. Continues to have weeping of her legs due to swelling. She is unable to mobilize without assistance from her son who lives with her and is a physical therapist. Yvone Neu says that he feels that if she got IV fluids, she would feel better and be able to be herself. She drinks nutritional supplements shakes but he has to remind her to keep sipping on them. She continues to have compression wraps on her lower legs due to swelling and weeping of skin.   Son Yvone Neu has previously expressed concern of lethargy and possible encephalopathy. He says that she will sometimes sleep till mid-late morning then it takes her a while to 'wake up' and become alert. Yvone Neu felt that symptoms at that time were related to fentanyl patch which has since been discontinued.  Consistently recognizes her general decline but questions if 'something else' could be causing her symptoms.  He says that he and his mother are not inclined to try irinotecan for concern of side effects and possibly negatively impacting her quality of life but he questions why she cannot take the first or second line chemotherapy drugs again since they worked well previously. He is  concerned that hospice will be forced on them.   Today we explored her symptoms and her goals. She says she feels generally 'fine' at home and denies specific complaints. She is forgetful and loses her train of thought frequently. She says she would like to pass at home. She is familiar with hospice and her husband passed away at the hospice home. She is concerned that hospice will forced medications on her and she doesn't feel she needs them. She says her pain is well controlled today. She says she is most worried about seeing her sons cry for her and she's worried they'll think she gave up. She is worried that doing more treatment may make her feel poorly and give her symptoms that she doesn't currently have. She says she would not want to be on machines and would prefer care to be focused on comfort.   Today she denies dizziness, fever, chills, cough, congestion, urinary symptoms, constipation, or diarrhea. She denies nausea, chest pain. She says she has a poor appetite and says she can eat very little but feels this is due to history of gastric bypass. She says she knows she is getting weaker.   ECOG FS:3 - Symptomatic, >50% confined to bed  Review of systems- Review of Systems  Constitutional: Positive for malaise/fatigue and weight loss. Negative for chills and fever.  HENT: Negative for congestion, ear discharge, ear pain, nosebleeds, sinus pain and sore throat.   Eyes: Negative for discharge and redness.  Respiratory: Negative for cough, sputum production, shortness of breath and  wheezing.   Cardiovascular: Positive for leg swelling. Negative for chest pain and palpitations.  Gastrointestinal: Positive for abdominal pain (chronic- denies pain today). Negative for blood in stool, constipation, diarrhea, heartburn, melena and nausea.  Genitourinary: Negative for dysuria, flank pain, frequency, hematuria and urgency.  Musculoskeletal: Negative for falls, joint pain and myalgias.  Skin: Negative  for itching and rash.  Neurological: Positive for weakness. Negative for dizziness, loss of consciousness and headaches.  Endo/Heme/Allergies: Negative for environmental allergies. Bruises/bleeds easily.  Psychiatric/Behavioral: Positive for memory loss. Negative for depression. The patient is not nervous/anxious and does not have insomnia.      Allergies  Allergen Reactions   Cefuroxime Nausea Only   Codeine Nausea Only   Doxycycline Other (See Comments)    Lip swelling   Gabapentin Other (See Comments)    Swelling.    Iohexol Itching    07-15-13 pt developed itching on fingers after contrast given. Dr. Irish Elders looked at pt and said to put in system as allergy. BB   Other Other (See Comments)    Contrast Dye- caused fever, chills, awful feeling Bandages - itching, rash   Oxycodone Other (See Comments)    nausea   Quinolones Swelling    Lip swelling   Synvisc [Hylan G-F 20] Swelling    Past Medical History:  Diagnosis Date   Arthritis    Asthma    COPD (chronic obstructive pulmonary disease) (HCC)    Dysphonia    Dyspnea    Esophageal cancer (HCC)    GERD (gastroesophageal reflux disease)    Heart murmur    History of kidney stones    Hypertension    Hypokalemia 10/07/2017   Melanoma (HCC)    OAB (overactive bladder)    OSA on CPAP    Paresthesia    from neck down after spinal abscess   Personal history of chemotherapy    esophageal cancer   Personal history of radiation therapy    esophageal cancer   Pupil asymmetry    R pupil defect   Skin cancer    Sleep apnea    Spinal cord abscess     Past Surgical History:  Procedure Laterality Date   ABDOMINAL HYSTERECTOMY     ANTERIOR CERVICAL DECOMP/DISCECTOMY FUSION N/A 07/16/2013   Procedure: ANTERIOR CERVICAL DECOMPRESSION/DISCECTOMY FUSION mutlipleLEVELS C4-7;  Surgeon: Erline Levine, MD;  Location: [facility];  Service: Neurosurgery;  Laterality: N/A;   APPENDECTOMY     carpel tunn  Bilateral    CATARACT EXTRACTION     CATARACT EXTRACTION, BILATERAL     CHOLECYSTECTOMY     dental implant     ESOPHAGOGASTRODUODENOSCOPY Left 03/21/2017   Procedure: ESOPHAGOGASTRODUODENOSCOPY (EGD);  Surgeon: Virgel Manifold, MD;  Location[facility];  Service: Endoscopy;  Laterality: Left;   ESOPHAGOGASTRODUODENOSCOPY (EGD) WITH PROPOFOL N/A 08/20/2017   Procedure: ESOPHAGOGASTRODUODENOSCOPY (EGD) WITH PROPOFOL;  Surgeon: Virgel Manifold, MD;  Location: [facility] ENDOSCOPY;  Service: Endoscopy;  Laterality: N/A;   ESOPHAGOGASTRODUODENOSCOPY (EGD) WITH PROPOFOL N/A 09/19/2017   Procedure: ESOPHAGOGASTRODUODENOSCOPY (EGD) WITH PROPOFOL;  Surgeon: Virgel Manifold, MD;  Location: [facility] ENDOSCOPY;  Service: Endoscopy;  Laterality: N/A;   ESOPHAGOGASTRODUODENOSCOPY (EGD) WITH PROPOFOL N/A 10/03/2017   Procedure: ESOPHAGOGASTRODUODENOSCOPY (EGD) WITH PROPOFOL;  Surgeon: Virgel Manifold, MD;  Location: [facility] ENDOSCOPY;  Service: Endoscopy;  Laterality: N/A;   ESOPHAGOGASTRODUODENOSCOPY (EGD) WITH PROPOFOL N/A 10/22/2017   Procedure: ESOPHAGOGASTRODUODENOSCOPY (EGD) WITH PROPOFOL;  Surgeon: Virgel Manifold, MD;  Location: [facility] ENDOSCOPY;  Service: Endoscopy;  Laterality: N/A;  ESOPHAGOGASTRODUODENOSCOPY (EGD) WITH PROPOFOL N/A 11/18/2017   Procedure: ESOPHAGOGASTRODUODENOSCOPY (EGD) WITH PROPOFOL;  Surgeon: Virgel Manifold, MD;  Location: [facility] ENDOSCOPY;  Service: Endoscopy;  Laterality: N/A;   ESOPHAGOGASTRODUODENOSCOPY (EGD) WITH PROPOFOL N/A 12/17/2017   Procedure: ESOPHAGOGASTRODUODENOSCOPY (EGD) WITH PROPOFOL;  Surgeon: Virgel Manifold, MD;  Location: [facility] ENDOSCOPY;  Service: Endoscopy;  Laterality: N/A;   ESOPHAGOGASTRODUODENOSCOPY (EGD) WITH PROPOFOL N/A 01/08/2018   Procedure: ESOPHAGOGASTRODUODENOSCOPY (EGD) WITH PROPOFOL;  Surgeon: Virgel Manifold, MD;  Location: [facility] ENDOSCOPY;  Service: Endoscopy;  Laterality: N/A;   ESOPHAGOGASTRODUODENOSCOPY  (EGD) WITH PROPOFOL N/A 01/22/2018   Procedure: ESOPHAGOGASTRODUODENOSCOPY (EGD) WITH PROPOFOL;  Surgeon: Virgel Manifold, MD;  Location: [facility] ENDOSCOPY;  Service: Endoscopy;  Laterality: N/A;   ESOPHAGOGASTRODUODENOSCOPY (EGD) WITH PROPOFOL N/A 07/15/2018   Procedure: ESOPHAGOGASTRODUODENOSCOPY (EGD) WITH PROPOFOL;  Surgeon: Virgel Manifold, MD;  Location: [facility] ENDOSCOPY;  Service: Endoscopy;  Laterality: N/A;   EYE SURGERY     GASTRIC BYPASS     HERNIA REPAIR     HIATAL HERNIA REPAIR     JOINT REPLACEMENT Bilateral    knee   MELANOMA EXCISION     PORTA CATH INSERTION N/A 04/07/2017   Procedure: PORTA CATH INSERTION;  Surgeon: Algernon Huxley, MD;  Location: [facility];  Service: Cardiovascular;  Laterality: N/A;   POSTERIOR CERVICAL FUSION/FORAMINOTOMY N/A 07/28/2013   Procedure: Cervical four-seven  posterior cervical fusion;  Surgeon: Erline Levine, MD;  Location: [facility];  Service: Neurosurgery;  Laterality: N/A;   TONSILLECTOMY     TOTAL KNEE ARTHROPLASTY      Social History   Socioeconomic History   Marital status: Widowed    Spouse name: Not on file   Number of children: 2   Years of education: Not on file   Highest education level: Master's degree (e.g., MA, MS, MEng, MEd, MSW, MBA)  Occupational History   Not on file  Social Needs   Financial resource strain: Not hard at all   Food insecurity    Worry: Never true    Inability: Never true   Transportation needs    Medical: No    Non-medical: No  Tobacco Use   Smoking status: Former Smoker    Packs/day: 1.00    Years: 20.00    Pack years: 20.00    Types: Cigarettes    Quit date: 1977    Years since quitting: 43.6   Smokeless tobacco: Never Used  Substance and Sexual Activity   Alcohol use: No   Drug use: Never   Sexual activity: Not Currently  Lifestyle   Physical activity    Days per week: 2 days    Minutes per session: 30 min   Stress: Not at all    Relationships   Social connections    Talks on phone: More than three times a week    Gets together: More than three times a week    Attends religious service: More than 4 times per year    Active member of club or organization: Yes    Attends meetings of clubs or organizations: More than 4 times per year    Relationship status: Widowed   Intimate partner violence    Fear of current or ex partner: No    Emotionally abused: No    Physically abused: No    Forced sexual activity: No  Other Topics Concern   Not on file  Social History Narrative   Marital Status- Widowed    Lives by herself  Employement- Retired Pharmacist, hospital   Exercise hx- Does PT     Family History  Problem Relation Age of Onset   Breast cancer Mother 25   Arthritis-Osteo Mother    Breast cancer Sister 93   Bladder Cancer Sister    Bladder Cancer Father    Tongue cancer Father    Congenital heart disease Father    Prostate cancer Brother    Uterine cancer Maternal Aunt    Lung cancer Paternal Uncle    Leukemia Maternal Grandmother    Liver cancer Paternal Grandmother    Colon cancer Neg Hx      Current Outpatient Medications:    albuterol (PROVENTIL HFA;VENTOLIN HFA) 108 (90 Base) MCG/ACT inhaler, Inhale 2 puffs into the lungs every 4 (four) hours as needed for wheezing or shortness of breath., Disp: , Rfl:    aspirin EC 81 MG tablet, Take 1 tablet (81 mg total) by mouth every other day., Disp: , Rfl:    calcium carbonate (TUMS EX) 750 MG chewable tablet, Chew 1 tablet by mouth as needed for heartburn. , Disp: , Rfl:    chlorhexidine (PERIDEX) 0.12 % solution, Use as directed 15 mLs in the mouth or throat 2 (two) times daily., Disp: , Rfl:    Cholecalciferol (VITAMIN D) 2000 UNITS CAPS, Take 2,000 Units by mouth daily., Disp: , Rfl:    dexamethasone (DECADRON) 1 MG tablet, TAKE 2 TABLETS BY MOUTH DAILY, Disp: 60 tablet, Rfl: 0   Diclofenac Sodium 1 % CREA, Apply to left arm as needed  for pain three times daily, Disp: 120 g, Rfl: 0   dronabinol (MARINOL) 5 MG capsule, Take 1 capsule (5 mg total) by mouth 4 (four) times daily., Disp: 120 capsule, Rfl: 0   fentaNYL (DURAGESIC) 12 MCG/HR, Place 1 patch onto the skin every 3 (three) days for 7 doses., Disp: 10 patch, Rfl: 0   fluticasone (FLONASE) 50 MCG/ACT nasal spray, Place 1-2 sprays into both nostrils daily., Disp: 16 g, Rfl: 5   furosemide (LASIX) 20 MG tablet, TAKE 1-2 TABLETS BY MOUTH DAILY AS NEEDED FOR EDEMA, Disp: 180 tablet, Rfl: 1   hydrocortisone cream 1 %, Apply 1 application topically daily as needed for itching. , Disp: , Rfl:    loratadine (CLARITIN) 10 MG tablet, Take 10 mg by mouth daily., Disp: , Rfl:    Multiple Vitamins-Minerals (MULTIVITAMIN ADULT) CHEW, Chew 1 tablet by mouth daily. , Disp: , Rfl:    MYRBETRIQ 25 MG TB24 tablet, TAKE ONE TABLET EVERY DAY, Disp: 30 tablet, Rfl: 5   oxybutynin (DITROPAN) 5 MG tablet, TAKE ONE TABLET BY MOUTH EVERY MORNING AND ONE-HALF TABLET AT BEDTIME, Disp: 45 tablet, Rfl: 3   pantoprazole (PROTONIX) 40 MG tablet, TAKE 1 TABLET BY MOUTH TWICE DAILY BEFORE A MEAL, Disp: 60 tablet, Rfl: 3   Polyethyl Glycol-Propyl Glycol (SYSTANE OP), Apply 1 drop to eye daily as needed (dry eyes)., Disp: , Rfl:    potassium chloride (KLOR-CON) 20 MEQ packet, Take 20 mEq by mouth daily., Disp: 30 packet, Rfl: 0   pregabalin (LYRICA) 150 MG capsule, Take 1 capsule (150 mg total) by mouth 2 (two) times daily., Disp: 60 capsule, Rfl: 3   prochlorperazine (COMPAZINE) 10 MG tablet, TAKE ONE TABLET BY MOUTH EVERY 6 HOURS AS NEEDED FOR NAUSEA OR VOMITING, Disp: 30 tablet, Rfl: 1   pyridOXINE (VITAMIN B-6) 100 MG tablet, Take 1 tablet (100 mg total) by mouth daily., Disp: 30 tablet, Rfl: 3   SENNA-PLUS 8.6-50 MG  tablet, TAKE TWO TABLETS BY MOUTH EVERY DAY, Disp: 60 tablet, Rfl: 0   traMADol (ULTRAM) 50 MG tablet, TAKE ONE TABLET BY MOUTH EVERY 6 HOURS AS NEEDED FOR UP TO 30 DAYS, Disp:  60 tablet, Rfl: 0   traZODone (DESYREL) 50 MG tablet, Take 0.5-1 tablets (25-50 mg total) by mouth at bedtime as needed for sleep., Disp: , Rfl:    trifluridine-tipiracil (LONSURF) 15-6.14 MG tablet, Take 2 tablets by mouth two (2) times daily after a meal. Take with two 49m tablets. Take only on days 1-5, 8-12. Repeat every 28days. (Patient not taking: Reported on 01/12/2019), Disp: 40 tablet, Rfl: 3   trifluridine-tipiracil (LONSURF) 20-8.19 MG tablet, Take 2 tablets by mouth two (2) times daily after a meal. Take with two 145mtablets. Take only on days 1-5, 8-12. Repeat every 28days. (Patient not taking: Reported on 01/12/2019), Disp: 40 tablet, Rfl: 3 No current facility-administered medications for this visit.   Facility-Administered Medications Ordered in Other Visits:    heparin lock flush 100 unit/mL, 500 Units, Intravenous, Once, YuEarlie ServerMD   sodium chloride flush (NS) 0.9 % injection 10 mL, 10 mL, Intravenous, PRN, YuEarlie ServerMD, 10 mL at 01/26/18 0904   sodium chloride flush (NS) 0.9 % injection 10 mL, 10 mL, Intravenous, Once, YuEarlie ServerMD  Physical exam:  Vitals:   01/12/19 1100  BP: 107/75  Pulse: 71  Resp: 16  Temp: 98.3 F (36.8 C)  TempSrc: Oral  SpO2: 100%   Physical Exam Vitals signs reviewed. Nursing note reviewed: discussed nursing assessment with nursing prior to seeing patient.  Constitutional:      General: She is not in acute distress.    Comments: Frail appearing, elderly female, in recliner in exam room; smiles and says hello when I enter.   HENT:     Head: Normocephalic and atraumatic.     Right Ear: External ear normal.     Left Ear: External ear normal.     Nose: No congestion.     Mouth/Throat:     Mouth: Mucous membranes are dry.     Pharynx: Oropharynx is clear.  Eyes:     General: No scleral icterus.    Conjunctiva/sclera: Conjunctivae normal.  Neck:     Musculoskeletal: Neck supple. No muscular tenderness.  Cardiovascular:     Rate and  Rhythm: Normal rate and regular rhythm.     Heart sounds: Murmur present.  Pulmonary:     Effort: Pulmonary effort is normal. No respiratory distress.     Breath sounds: Normal breath sounds.  Abdominal:     General: There is no distension.     Tenderness: There is no abdominal tenderness.     Comments: hepatomegaly  Musculoskeletal:     Right lower leg: Edema present.     Left lower leg: Edema present.  Skin:    General: Skin is warm and dry.  Neurological:     Mental Status: She is oriented to person, place, and time.     Comments: Forgetful & confused at times  Psychiatric:        Mood and Affect: Mood and affect normal.        Speech: Speech normal.        Behavior: Behavior is cooperative.      CMP Latest Ref Rng & Units 01/12/2019  Glucose 70 - 99 mg/dL 155(H)  BUN 8 - 23 mg/dL 16  Creatinine 0.44 - 1.00 mg/dL 0.87  Sodium 135 - 145 mmol/L  132(L)  Potassium 3.5 - 5.1 mmol/L 3.7  Chloride 98 - 111 mmol/L 94(L)  CO2 22 - 32 mmol/L 28  Calcium 8.9 - 10.3 mg/dL 8.5(L)  Total Protein 6.5 - 8.1 g/dL 6.9  Total Bilirubin 0.3 - 1.2 mg/dL 0.9  Alkaline Phos 38 - 126 U/L 205(H)  AST 15 - 41 U/L 80(H)  ALT 0 - 44 U/L 26   CBC Latest Ref Rng & Units 01/12/2019  WBC 4.0 - 10.5 K/uL 7.1  Hemoglobin 12.0 - 15.0 g/dL 8.8(L)  Hematocrit 36.0 - 46.0 % 29.5(L)  Platelets 150 - 400 K/uL 268    No images are attached to the encounter.  Mr Jeri Cos Wo Contrast  Result Date: 12/17/2018 CLINICAL DATA:  Malignant neoplasm of overlapping sites of esophagus, intermittent confusion. EXAM: MRI HEAD WITHOUT AND WITH CONTRAST TECHNIQUE: Multiplanar, multiecho pulse sequences of the brain and surrounding structures were obtained without and with intravenous contrast. CONTRAST:  7.5 mL Gadavist COMPARISON:  Brain MRI 07/21/2018 FINDINGS: Brain: Multiple sequences are motion degraded. No restricted diffusion is demonstrated to suggest acute or recent subacute infarction. No intracranial hemorrhage,  midline shift or extra-axial collection. Mild generalized parenchymal atrophy. Moderate ill-defined and scattered T2/FLAIR hyperintensity within the cerebral white matter and brainstem is nonspecific, but consistent with chronic small vessel ischemic disease. Redemonstrated small chronic left occipital lobe infarct. There is a 3 mm focus of enhancement within the anterior left temporal lobe which was not present on prior exam (series 18, image 59). No other abnormal intracranial enhancement. Vascular: Flow voids maintained within the proximal large vessels. Skull and upper cervical spine: Normal marrow signal. Susceptibility artifact from spinal fusion hardware partially visualized. Sinuses/Orbits: Bilateral lens replacements. The globes and orbits are otherwise unremarkable. Small left sphenoid sinus air-fluid level. No significant mastoid effusion at the levels imaged. IMPRESSION: Motion degraded examination. A 3 mm focus of enhancement within the anterior left temporal lobe was not present on prior MRI 07/21/2018. Findings are suspicious for possible metastatic lesion. Short interval MRI follow-up is recommended, as clinically warranted Stable moderate chronic small vessel ischemic disease with redemonstrated chronic left occipital lobe infarct. Electronically Signed   By: Kellie Simmering   On: 12/17/2018 18:06    Assessment and plan- Patient is a 84 y.o. female diagnosed with metastatic esophageal cancer, who presents [facility] for family concerns of dehydration, poor oral intake, and confusion.   1.  Esophageal adenocarcinoma-metastatic.  Progressed on fourth-line treatment.  MRI in August showed 3 mm foci suspicious for brain metastases.  Dr. Tasia Catchings had discussed irinotecan as possible 5th line treatment though very guarded and hospice was recommended. Spoke to Dr. Tasia Catchings regarding son's questions for returning to 1st or second line treatments. She said patient had progressed through those  treatments and would not recommend them. She again recommends comfort care/hospice and if patient requests treatment, then irinotecan.   2. Goals of Care- After lengthy discussion with patient and her son, patient wishes to meet with hospice to discuss initiation of care. Her son Yvone Neu is also in agreement with this. Case also discussed with Billey Chang, NP & Dr. Tasia Catchings who are in agreement with plan of care. We will keep her appointment with Dr. Tasia Catchings and Billey Chang, NP as scheduled and if she elects for hospice, we can cancel those appointments.   3. Confusion & Brain Metastases- etiology unclear but suspect further progression of disease and possibly related to brain metastases. Confusion is intermittent and she is alert though  recall memory is impaired. Previously ammonia level normal. Low suspicion for infection today; labs overall stable. No fever, wbc normal. Wouldn't recommend radiation given significant disease progression elsewhere.   4. Poor oral intake/weight loss/malnutrition- weight overall stable today but physically appears more frail/less muscle. Clinically appears somewhat dry but creatinine and BUN stable to improved. Son very eager for IV fluids today; discussed concern for risks & fluid overload. Discussed with Dr. Tasia Catchings who recommends 250m today which was given.  Currently on decadron for appetite stimulation. Encouraged small portions of calorie dense foods and frequent snacks. Continue nutritional supplements such as ensure. Emotional support provided & recommended against force feeding as this may have associated risks.   5.Impaired Mobility/Weakness- patient increasingly dependent on family member for ADLs. He requests a temporary ramp into home and wheelchair today.   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 A cane, walker, and/or crutch will  not resolves issues with performing activities of daily living. A wheelchair will allow patient to safely perform daily activities. Patient is not able to propel herself in the home using a standard weight wheelchair due to arm weakness, general weakness, and/or impaired endurance. Patient can self propel in the lightweight wheelchair and with assistance from her son. She will also benefit from cushion in wheelchair, anti-tippers, elevated leg rests.   6. Elevated LFTs- Alk phos more increased today- 205, AST elevated at 80. Suspect secondary to progression of disease. Discussed with patient and family.   DISPOSITION: Refer to hospice, order for DME (wheelchair & ramp). Keep scheduled follow-ups. Ok to cancel if patient elects for hospice services.   Visit Diagnosis 1. Esophageal adenocarcinoma ([facility]   2. Weakness   3. Brain metastases ([facility]     Patient expressed understanding and was in agreement with this plan. She also understands that She can call clinic at any time with any questions, concerns, or complaints.   Thank you for allowing me to participate in the care of this very pleasant patient.   Time Total: 75 minutes  Visit consisted of counseling and education dealing with the complex and emotionally intense issues of symptom management and palliative care in the setting of serious and potentially life-threatening illness.Greater than 50%  of this time was spent counseling and coordinating care related to the above assessment and plan.  LBeckey Rutter DNP, AGNP-C [id number] [phone](work cell) [phone](office)  CC: Dr. DDamita Dunnings JBilley Chang NP, Dr. YTasia Catchings

## 2019-01-13 ENCOUNTER — Other Ambulatory Visit: Payer: Self-pay | Admitting: *Deleted

## 2019-01-15 ENCOUNTER — Telehealth: Payer: Self-pay | Admitting: *Deleted

## 2019-01-15 NOTE — Telephone Encounter (Signed)
Hospice nurse Sarah called requesting for patient Tramadol order to be changed to 1/5 tabs every 4 hours as needed from 1 tablet every 6 hours as needed. States patient is already taking it 1 tablet (50 mg) every 4 hours and it is not quite controlling her pain. Please advise

## 2019-01-19 NOTE — Telephone Encounter (Signed)
I spoke with hospice nurse. Okay to increase tramadol to 100mg  Q4-6H. Max of 400mg /day.

## 2019-01-21 ENCOUNTER — Inpatient Hospital Stay: Payer: Medicare Other | Admitting: Hospice and Palliative Medicine

## 2019-01-21 ENCOUNTER — Inpatient Hospital Stay: Payer: Medicare Other | Admitting: Oncology

## 2019-02-03 ENCOUNTER — Other Ambulatory Visit: Payer: Self-pay | Admitting: Oncology

## 2019-02-14 ENCOUNTER — Encounter: Payer: Self-pay | Admitting: *Deleted

## 2019-02-14 NOTE — Patient Instructions (Signed)
FMLA forms completed for patients son. Forms have been scanned to Dr. Collie Siad clinic team to be signed and faxed to [employer].

## 2019-02-17 ENCOUNTER — Other Ambulatory Visit: Payer: Self-pay | Admitting: Oncology

## 2019-02-18 ENCOUNTER — Other Ambulatory Visit: Payer: Self-pay | Admitting: *Deleted

## 2019-02-18 MED ORDER — TRAMADOL HCL 50 MG PO TABS: ORAL_TABLET | ORAL | 0 refills | Status: AC

## 2019-03-12 ENCOUNTER — Other Ambulatory Visit: Payer: Self-pay | Admitting: *Deleted

## 2019-03-12 MED ORDER — MORPHINE SULFATE (CONCENTRATE) 20 MG/ML PO SOLN: ORAL | 0 refills | Status: AC

## 2019-03-14 NOTE — Telephone Encounter (Signed)
[name] called reporting that patient is declining and they have opened the comfort kit and she is using Roxanol .05 ml (10 mg) every 1 hours as needed and is requesting a refill of this. She also states that she called in Lorazepam and Hyoscyamine

## 2019-03-14 DEATH — deceased

## 2019-03-16 ENCOUNTER — Telehealth: Payer: Self-pay

## 2019-03-16 ENCOUNTER — Telehealth: Payer: Self-pay | Admitting: Family Medicine

## 2019-03-16 NOTE — Telephone Encounter (Signed)
Called and LMOVM for patient's son to offer condolences.  She was a kind lady and I was always glad to see her.  She will be missed.

## 2019-03-16 NOTE — Telephone Encounter (Signed)
Death certificate completed [facility] notified for pick up. Certificate faxed to medial records to scan into chart.

## 2019-05-03 ENCOUNTER — Ambulatory Visit: Payer: Medicare Other | Admitting: Radiation Oncology
